# Patient Record
Sex: Male | Born: 1970 | Hispanic: No | Marital: Single | Smoking: Current every day smoker
Health system: Southern US, Community
[De-identification: ages and names within clinical notes are randomized; demographics above are authoritative.]

## PROBLEM LIST (undated history)

## (undated) DIAGNOSIS — F329 Major depressive disorder, single episode, unspecified: Secondary | ICD-10-CM

## (undated) DIAGNOSIS — K7460 Unspecified cirrhosis of liver: Secondary | ICD-10-CM

## (undated) DIAGNOSIS — R001 Bradycardia, unspecified: Secondary | ICD-10-CM

## (undated) DIAGNOSIS — I10 Essential (primary) hypertension: Secondary | ICD-10-CM

## (undated) DIAGNOSIS — N186 End stage renal disease: Secondary | ICD-10-CM

## (undated) DIAGNOSIS — E559 Vitamin D deficiency, unspecified: Secondary | ICD-10-CM

## (undated) DIAGNOSIS — K7290 Hepatic failure, unspecified without coma: Secondary | ICD-10-CM

## (undated) DIAGNOSIS — J189 Pneumonia, unspecified organism: Secondary | ICD-10-CM

## (undated) DIAGNOSIS — L0231 Cutaneous abscess of buttock: Secondary | ICD-10-CM

## (undated) DIAGNOSIS — F419 Anxiety disorder, unspecified: Secondary | ICD-10-CM

## (undated) DIAGNOSIS — Z992 Dependence on renal dialysis: Secondary | ICD-10-CM

## (undated) DIAGNOSIS — Z720 Tobacco use: Secondary | ICD-10-CM

## (undated) DIAGNOSIS — M109 Gout, unspecified: Secondary | ICD-10-CM

## (undated) DIAGNOSIS — G9340 Encephalopathy, unspecified: Secondary | ICD-10-CM

## (undated) DIAGNOSIS — I509 Heart failure, unspecified: Secondary | ICD-10-CM

## (undated) DIAGNOSIS — G40909 Epilepsy, unspecified, not intractable, without status epilepticus: Secondary | ICD-10-CM

## (undated) DIAGNOSIS — G629 Polyneuropathy, unspecified: Secondary | ICD-10-CM

## (undated) DIAGNOSIS — E872 Acidosis: Secondary | ICD-10-CM

## (undated) DIAGNOSIS — E119 Type 2 diabetes mellitus without complications: Secondary | ICD-10-CM

## (undated) DIAGNOSIS — N19 Unspecified kidney failure: Secondary | ICD-10-CM

## (undated) DIAGNOSIS — J9690 Respiratory failure, unspecified, unspecified whether with hypoxia or hypercapnia: Secondary | ICD-10-CM

## (undated) HISTORY — DX: End stage renal disease: Z99.2

## (undated) HISTORY — DX: Polyneuropathy, unspecified: G62.9

## (undated) HISTORY — PX: CHOLECYSTECTOMY: SHX55

## (undated) HISTORY — DX: Vitamin D deficiency, unspecified: E55.9

## (undated) HISTORY — DX: Bradycardia, unspecified: R00.1

## (undated) HISTORY — DX: Heart failure, unspecified: I50.9

## (undated) HISTORY — DX: Tobacco use: Z72.0

## (undated) HISTORY — DX: Essential (primary) hypertension: I10

## (undated) HISTORY — DX: Cutaneous abscess of buttock: L02.31

## (undated) HISTORY — DX: Encephalopathy, unspecified: G93.40

## (undated) HISTORY — DX: Respiratory failure, unspecified, unspecified whether with hypoxia or hypercapnia: J96.90

## (undated) HISTORY — DX: Major depressive disorder, single episode, unspecified: F32.9

## (undated) HISTORY — DX: End stage renal disease: N18.6

## (undated) HISTORY — DX: Gout, unspecified: M10.9

## (undated) HISTORY — DX: Anxiety disorder, unspecified: F41.9

## (undated) HISTORY — DX: Epilepsy, unspecified, not intractable, without status epilepticus: G40.909

---
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
## 2005-01-24 HISTORY — PX: HERNIA REPAIR: SHX51

## 2005-09-21 ENCOUNTER — Ambulatory Visit (HOSPITAL_BASED_OUTPATIENT_CLINIC_OR_DEPARTMENT_OTHER): Admission: RE | Admit: 2005-09-21 | Discharge: 2005-09-21 | Payer: Self-pay | Admitting: Family Medicine

## 2005-09-24 ENCOUNTER — Ambulatory Visit: Payer: Self-pay | Admitting: Internal Medicine

## 2014-02-03 DIAGNOSIS — L02419 Cutaneous abscess of limb, unspecified: Secondary | ICD-10-CM

## 2014-02-03 DIAGNOSIS — E875 Hyperkalemia: Secondary | ICD-10-CM | POA: Insufficient documentation

## 2014-02-03 DIAGNOSIS — E876 Hypokalemia: Secondary | ICD-10-CM | POA: Insufficient documentation

## 2014-02-03 HISTORY — DX: Cutaneous abscess of limb, unspecified: L02.419

## 2014-09-19 DIAGNOSIS — L03031 Cellulitis of right toe: Secondary | ICD-10-CM

## 2014-09-19 DIAGNOSIS — N492 Inflammatory disorders of scrotum: Secondary | ICD-10-CM | POA: Insufficient documentation

## 2014-09-19 HISTORY — DX: Cellulitis of right toe: L03.031

## 2014-09-19 HISTORY — DX: Inflammatory disorders of scrotum: N49.2

## 2016-01-06 ENCOUNTER — Encounter: Payer: Self-pay | Admitting: Sports Medicine

## 2016-01-06 ENCOUNTER — Ambulatory Visit (INDEPENDENT_AMBULATORY_CARE_PROVIDER_SITE_OTHER): Payer: Medicare Other | Admitting: Sports Medicine

## 2016-01-06 DIAGNOSIS — B070 Plantar wart: Secondary | ICD-10-CM

## 2016-01-06 DIAGNOSIS — L84 Corns and callosities: Secondary | ICD-10-CM | POA: Diagnosis not present

## 2016-01-06 DIAGNOSIS — L97519 Non-pressure chronic ulcer of other part of right foot with unspecified severity: Secondary | ICD-10-CM | POA: Diagnosis not present

## 2016-01-06 DIAGNOSIS — E1142 Type 2 diabetes mellitus with diabetic polyneuropathy: Secondary | ICD-10-CM | POA: Diagnosis not present

## 2016-01-06 DIAGNOSIS — B351 Tinea unguium: Secondary | ICD-10-CM | POA: Diagnosis not present

## 2016-01-06 DIAGNOSIS — M79675 Pain in left toe(s): Secondary | ICD-10-CM | POA: Diagnosis not present

## 2016-01-06 DIAGNOSIS — M79674 Pain in right toe(s): Secondary | ICD-10-CM

## 2016-01-06 DIAGNOSIS — Z872 Personal history of diseases of the skin and subcutaneous tissue: Secondary | ICD-10-CM | POA: Diagnosis not present

## 2016-01-06 NOTE — Progress Notes (Signed)
Subjective: Eddie Davies is a 45 y.o. male patient seen in office for evaluation of ulceration of the Right hallux. Patient has a history of diabetes and a blood glucose level 187 today but states that he thinks his machine is broken because once minute later he will get a # >400.   Patient is changing the dressing using nothing. States that the wound care center tried to heal the wound but could not so sent him here. Denies nausea/fever/vomiting/chills/night sweats/shortness of breath/pain. Patient is also here for nail and callus care. Patient has no other pedal complaints at this time.  There are no active problems to display for this patient.  No current outpatient prescriptions on file prior to visit.   No current facility-administered medications on file prior to visit.    Not on File  No results found for this or any previous visit (from the past 2160 hour(s)).  Objective: There were no vitals filed for this visit.  General: Patient is awake, alert, oriented x 3 and in no acute distress.  Dermatology: Skin is warm and dry bilateral with a Full thickness ulceration present right plantar medial hallux. Ulceration measures 3 cm x by 3 cm x 0.5 cm. There is a  Severely hyperkeratotic border with a bleeding granular base concerning for possible warty involvement or marjolins ulcer base. The ulceration does not probe to bone. There is no malodor, no active drainage, no erythema, no edema. No acute signs of infection. Severe callusing, plantar left hallux and submetatarsal 1 on left with no acute openings or signs of infection. 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 Nails 10 are elongated, thickened and dystrophic consistent with onychomycosis.   Vascular: Dorsalis Pedis pulse = 0/4 Bilateral,  Posterior Tibial pulse = 0/4 Bilateral due to Patient habitus,  Capillary Fill Time < 5 seconds  Neurologic: Protective and  vibratory sensation absent bilateral.  Musculosketal: No Pain with palpation to ulcerated area. No pain with compression to calves bilateral. Pes planus noted bilateral.    Assessment and Plan:  Problem List Items Addressed This Visit    None    Visit Diagnoses    Skin ulcer of right great toe, unspecified ulcer stage ([place])    -  Primary   Relevant Orders   Dermatology pathology   Plantar wart of right foot       Relevant Orders   Dermatology pathology   Callus of foot       Relevant Orders   Dermatology pathology   Dermatophytosis of nail       Relevant Orders   Dermatology pathology   Diabetic polyneuropathy associated with type 2 diabetes mellitus (HCC)       Relevant Medications   carbamazepine (CARBATROL) 300 MG 12 hr capsule   gabapentin (NEURONTIN) 300 MG capsule   LANTUS SOLOSTAR 100 UNIT/ML Solostar Pen   HUMULIN R U-500 KWIKPEN 500 UNIT/ML kwikpen   metFORMIN (GLUCOPHAGE) 500 MG tablet   PHENobarbital (LUMINAL) 97.2 MG tablet   valsartan (DIOVAN) 160 MG tablet   Other Relevant Orders   Dermatology pathology   Toe pain, bilateral       Relevant Orders   Dermatology pathology   H/O keratoderma          -Examined patient and discussed the progression of the wound and treatment alternatives. - Excisionally dedbrided ulceration to healthy bleeding borders using a sterile chisel/15  bladeAnd sent tissue specimen to [facility] for pathologic review to evaluate for warty involvement -Applied Iodosorb and dry sterile dressing and instructed patient to continue with daily dressings at home consisting of Betadine and bandaid/dry sterile dressing. - Advised patient to go to the ER or return to office if the wound worsens or if constitutional symptoms are present. -Mechanically debrided nails 10 using sterile nail nipper and mechanically debrided callus using a sterile chisel blade without incident -Recommend daily skin emollients and creams as recommended by previous  dermatologist -Patient to return to office in 1 week for follow up care and evaluation or sooner if problems arise. Patient states that he cannot follow up in 1 week. Thus, I strongly advised patient to figure out a way to have the ulcer checked in 1 week. Otherwise, if patient is not able to come, to come at earliest convenience, which patient states he can come in 3 weeks.  Landis Martins, DPM

## 2016-01-06 NOTE — Patient Instructions (Signed)

## 2016-01-27 ENCOUNTER — Ambulatory Visit (INDEPENDENT_AMBULATORY_CARE_PROVIDER_SITE_OTHER): Payer: Medicare Other | Admitting: Sports Medicine

## 2016-01-27 ENCOUNTER — Other Ambulatory Visit: Payer: Self-pay

## 2016-01-27 ENCOUNTER — Encounter: Payer: Self-pay | Admitting: Sports Medicine

## 2016-01-27 DIAGNOSIS — B070 Plantar wart: Secondary | ICD-10-CM

## 2016-01-27 DIAGNOSIS — L97501 Non-pressure chronic ulcer of other part of unspecified foot limited to breakdown of skin: Secondary | ICD-10-CM

## 2016-01-27 DIAGNOSIS — L84 Corns and callosities: Secondary | ICD-10-CM

## 2016-01-27 DIAGNOSIS — E1142 Type 2 diabetes mellitus with diabetic polyneuropathy: Secondary | ICD-10-CM

## 2016-01-27 MED ORDER — AMOXICILLIN-POT CLAVULANATE 875-125 MG PO TABS: 1.0000 | ORAL_TABLET | Freq: Two times a day (BID) | ORAL | 0 refills | Status: DC

## 2016-01-27 NOTE — Progress Notes (Signed)
Subjective: Eddie Davies is a 46 y.o. male patient seen in office for follow up evaluation of ulceration of the Right>left hallux and plantar wart. Patient has a history of diabetes and a blood glucose level not recorded.   Patient is changing the dressing using iodine.Denies nausea/fever/vomiting/chills/night sweats/shortness of breath/pain. Patient has no other pedal complaints at this time.  There are no active problems to display for this patient.  Current Outpatient Prescriptions on File Prior to Visit  Medication Sig Dispense Refill  . acetaminophen-codeine (TYLENOL #3) 300-30 MG tablet     . amLODipine (NORVASC) 10 MG tablet     . carbamazepine (CARBATROL) 300 MG 12 hr capsule     . COLCRYS 0.6 MG tablet TK 1 T PO  QD PRN  0  . gabapentin (NEURONTIN) 300 MG capsule     . HUMULIN R U-500 KWIKPEN 500 UNIT/ML kwikpen     . LANTUS SOLOSTAR 100 UNIT/ML Solostar Pen     . metFORMIN (GLUCOPHAGE) 500 MG tablet     . omeprazole (PRILOSEC) 20 MG capsule     . PHENobarbital (LUMINAL) 97.2 MG tablet TK 2 AND 1/2 TS PO QHS  1  . ULORIC 40 MG tablet     . valsartan (DIOVAN) 160 MG tablet      No current facility-administered medications on file prior to visit.    Not on File  No results found for this or any previous visit (from the past 2160 hour(s)).  Objective: There were no vitals filed for this visit.  General: Patient is awake, alert, oriented x 3 and in no acute distress.   Dermatology: Skin is warm and dry bilateral with a partial thickness ulceration present right plantar medial hallux. Ulceration measures 3 cm x by 2.5 cm x 0.5 cm. There is a Severely hyperkeratotic border with a bleeding granular base concerning surrounding warty involvement. The ulceration does not probe to bone. There is no malodor, no active drainage, no erythema, no edema. No acute signs of infection. 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 Nails 10 are elongated, thickened and dystrophic consistent with onychomycosis.   Vascular: Dorsalis Pedis pulse = 0/4 Bilateral,  Posterior Tibial pulse = 0/4 Bilateral due to Patient habitus,  Capillary Fill Time < 5 seconds  Neurologic: Protective and vibratory sensation absent bilateral.  Musculosketal: No Pain with palpation to ulcerated area. No pain with compression to calves bilateral. Pes planus noted bilateral.    Assessment and Plan:  Problem List Items Addressed This Visit    None    Visit Diagnoses    Skin ulcer of great toe, unspecified laterality, limited to breakdown of skin ([place])    -  Primary   bilateral   Plantar wart of both feet       + Path Traumatized Wart   Callus of foot       Diabetic polyneuropathy associated with type 2 diabetes mellitus ([place])          -Examined patient and discussed the progression of the wound and treatment alternatives. - Excisionally dedbrided ulcerations to healthy bleeding borders using a sterile chisel/15  Blade -Applied Iodosorb to central wound and canthrone to periwound area and dry sterile dressing and instructed patient to continue with daily dressings at home consisting of Betadine and bandaid/dry sterile dressing. - Advised patient  to go to the ER or return to office if the wound worsens or if constitutional symptoms are present. -Recommend daily skin emollients and creams as recommended by previous dermatologist -Patient to return to office in 3 weeks.  Landis Martins, DPM

## 2016-02-17 ENCOUNTER — Ambulatory Visit (INDEPENDENT_AMBULATORY_CARE_PROVIDER_SITE_OTHER): Payer: Medicare Other | Admitting: Sports Medicine

## 2016-02-17 ENCOUNTER — Encounter: Payer: Self-pay | Admitting: Sports Medicine

## 2016-02-17 DIAGNOSIS — B070 Plantar wart: Secondary | ICD-10-CM

## 2016-02-17 DIAGNOSIS — L97501 Non-pressure chronic ulcer of other part of unspecified foot limited to breakdown of skin: Secondary | ICD-10-CM

## 2016-02-17 DIAGNOSIS — E1142 Type 2 diabetes mellitus with diabetic polyneuropathy: Secondary | ICD-10-CM

## 2016-02-17 NOTE — Progress Notes (Signed)
Subjective: Eddie Davies is a 46 y.o. male patient seen in office for follow up evaluation of ulceration of the Right>left hallux and plantar wart. Patient has a history of diabetes and a blood glucose level not recorded.   Patient is changing the dressing using iodine.Denies nausea/fever/vomiting/chills/night sweats/shortness of breath/pain. Patient has no other pedal complaints at this time.  There are no active problems to display for this patient.  Current Outpatient Prescriptions on File Prior to Visit  Medication Sig Dispense Refill  . acetaminophen-codeine (TYLENOL #3) 300-30 MG tablet     . amLODipine (NORVASC) 10 MG tablet     . amoxicillin-clavulanate (AUGMENTIN) 875-125 MG tablet Take 1 tablet by mouth 2 (two) times daily. 28 tablet 0  . carbamazepine (CARBATROL) 300 MG 12 hr capsule     . COLCRYS 0.6 MG tablet TK 1 T PO  QD PRN  0  . gabapentin (NEURONTIN) 300 MG capsule     . HUMULIN R U-500 KWIKPEN 500 UNIT/ML kwikpen     . LANTUS SOLOSTAR 100 UNIT/ML Solostar Pen     . metFORMIN (GLUCOPHAGE) 500 MG tablet     . omeprazole (PRILOSEC) 20 MG capsule     . PHENobarbital (LUMINAL) 97.2 MG tablet TK 2 AND 1/2 TS PO QHS  1  . simvastatin (ZOCOR) 40 MG tablet TK 1 T PO  QD  3  . ULORIC 40 MG tablet     . valsartan (DIOVAN) 160 MG tablet      No current facility-administered medications on file prior to visit.    Not on File  No results found for this or any previous visit (from the past 2160 hour(s)).  Objective: There were no vitals filed for this visit.  General: Patient is awake, alert, oriented x 3 and in no acute distress.   Dermatology: Skin is warm and dry bilateral with a partial thickness ulceration present right 4th toe measuring 2x1cm and plantar medial hallux measuring 2.5 cm x by 2.5 cm x 0.2 cm. There is a Severely hyperkeratotic border with a bleeding granular base concerning surrounding warty involvement. The ulceration does not probe to bone. There is no  malodor, no active drainage, no erythema, no edema. No acute signs of infection. 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 Nails 10 are elongated, thickened and dystrophic consistent with onychomycosis.   Vascular: Dorsalis Pedis pulse = 0/4 Bilateral,  Posterior Tibial pulse = 0/4 Bilateral due to Patient habitus,  Capillary Fill Time < 5 seconds  Neurologic: Protective and vibratory sensation absent bilateral.  Musculosketal: No Pain with palpation to ulcerated area. No pain with compression to calves bilateral. Pes planus noted bilateral.    Assessment and Plan:  Problem List Items Addressed This Visit    None    Visit Diagnoses    Skin ulcer of great toe, unspecified laterality, limited to breakdown of skin (HCC)    -  Primary   Plantar wart of both feet       Callus of foot       Diabetic polyneuropathy associated with type 2 diabetes mellitus (HCC)       Relevant Medications   gabapentin (NEURONTIN) 600 MG tablet      -Examined patient and discussed the progression of the wound and treatment alternatives. - Excisionally dedbrided ulcerations to healthy bleeding  borders using a sterile chisel Blade and tissue nipper -Applied Iodosorb to central wound and canthrone to periwound area bilateral and dry sterile dressing and instructed patient to continue with daily dressings at home consisting of Betadine and bandaid/dry sterile dressing. - Advised patient to go to the ER or return to office if the wound worsens or if constitutional symptoms are present. -Recommend daily skin emollients and creams as recommended by previous dermatologist -Patient to return to office in 3-4 weeks.  Landis Martins, DPM

## 2016-03-16 ENCOUNTER — Encounter: Payer: Self-pay | Admitting: Sports Medicine

## 2016-03-16 ENCOUNTER — Ambulatory Visit (INDEPENDENT_AMBULATORY_CARE_PROVIDER_SITE_OTHER): Payer: Medicare Other | Admitting: Sports Medicine

## 2016-03-16 DIAGNOSIS — B070 Plantar wart: Secondary | ICD-10-CM

## 2016-03-16 DIAGNOSIS — M79675 Pain in left toe(s): Secondary | ICD-10-CM

## 2016-03-16 DIAGNOSIS — E1142 Type 2 diabetes mellitus with diabetic polyneuropathy: Secondary | ICD-10-CM

## 2016-03-16 DIAGNOSIS — L97501 Non-pressure chronic ulcer of other part of unspecified foot limited to breakdown of skin: Secondary | ICD-10-CM

## 2016-03-16 DIAGNOSIS — M79674 Pain in right toe(s): Secondary | ICD-10-CM

## 2016-03-16 NOTE — Progress Notes (Signed)
Subjective: Eddie Davies is a 46 y.o. male patient seen in office for follow up evaluation of ulceration of the Right>left hallux and plantar wart. Patient has a history of diabetes and a blood glucose level not recorded.   Patient is changing the dressing using iodine. Reports he has to go to GI doctor. Denies nausea/fever/vomiting/chills/night sweats/shortness of breath/pain. Patient has no other pedal complaints at this time.  There are no active problems to display for this patient.  Current Outpatient Prescriptions on File Prior to Visit  Medication Sig Dispense Refill  . acetaminophen-codeine (TYLENOL #3) 300-30 MG tablet     . amLODipine (NORVASC) 10 MG tablet     . amoxicillin-clavulanate (AUGMENTIN) 875-125 MG tablet Take 1 tablet by mouth 2 (two) times daily. 28 tablet 0  . carbamazepine (CARBATROL) 300 MG 12 hr capsule     . COLCRYS 0.6 MG tablet TK 1 T PO  QD PRN  0  . gabapentin (NEURONTIN) 300 MG capsule     . gabapentin (NEURONTIN) 600 MG tablet     . HUMULIN R U-500 KWIKPEN 500 UNIT/ML kwikpen     . LANTUS SOLOSTAR 100 UNIT/ML Solostar Pen     . metFORMIN (GLUCOPHAGE) 500 MG tablet     . metoprolol (LOPRESSOR) 50 MG tablet     . omeprazole (PRILOSEC) 20 MG capsule     . PHENobarbital (LUMINAL) 97.2 MG tablet TK 2 AND 1/2 TS PO QHS  1  . simvastatin (ZOCOR) 40 MG tablet TK 1 T PO  QD  3  . ULORIC 40 MG tablet     . valsartan (DIOVAN) 160 MG tablet      No current facility-administered medications on file prior to visit.    Not on File  No results found for this or any previous visit (from the past 2160 hour(s)).  Objective: There were no vitals filed for this visit.  General: Patient is awake, alert, oriented x 3 and in no acute distress.   Dermatology: Skin is warm and dry bilateral with a partial thickness ulceration present right 4th toe measuring 2x1cm and plantar medial hallux measuring 2.5 cm x by 2.5 cm x 0.2 cm same. There is a Severely hyperkeratotic  border with a bleeding granular base concerning surrounding warty involvement. The ulceration does not probe to bone. There is no malodor, no active drainage, no erythema, no edema. No acute signs of infection. 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 Nails 10 are elongated, thickened and dystrophic consistent with onychomycosis.   Vascular: Dorsalis Pedis pulse = 0/4 Bilateral,  Posterior Tibial pulse = 0/4 Bilateral due to Patient habitus,  Capillary Fill Time < 5 seconds  Neurologic: Protective and vibratory sensation absent bilateral.  Musculosketal: No Pain with palpation to ulcerated area. No pain with compression to calves bilateral. Pes planus noted bilateral.    Assessment and Plan:  Problem List Items Addressed This Visit    None    Visit Diagnoses    Skin ulcer of great toe, unspecified laterality, limited to breakdown of skin (HCC)    -  Primary   Plantar wart of both feet       Diabetic polyneuropathy associated with type 2 diabetes mellitus (HCC)       Toe pain, bilateral          -  Examined patient and discussed the progression of the wound and treatment alternatives. - Excisionally dedbrided ulcerations to healthy bleeding borders using a sterile chisel Blade and tissue nipper -Applied Iodosorb to central wound and canthrone to periwound area bilateral and dry sterile dressing and instructed patient to continue with daily dressings at home consisting of Betadine and alternative with Medihoney to see if we can get improvement covered with bandaid/dry sterile dressing. - Advised patient to go to the ER or return to office if the wound worsens or if constitutional symptoms are present. -Recommend daily skin emollients and creams as recommended by previous dermatologist -Patient to  return to office in 3-4 weeks.  Landis Martins, DPM

## 2016-04-13 ENCOUNTER — Ambulatory Visit (INDEPENDENT_AMBULATORY_CARE_PROVIDER_SITE_OTHER): Payer: Medicare Other | Admitting: Sports Medicine

## 2016-04-13 ENCOUNTER — Encounter: Payer: Self-pay | Admitting: Sports Medicine

## 2016-04-13 DIAGNOSIS — M79675 Pain in left toe(s): Secondary | ICD-10-CM

## 2016-04-13 DIAGNOSIS — E1142 Type 2 diabetes mellitus with diabetic polyneuropathy: Secondary | ICD-10-CM

## 2016-04-13 DIAGNOSIS — B070 Plantar wart: Secondary | ICD-10-CM

## 2016-04-13 DIAGNOSIS — L97501 Non-pressure chronic ulcer of other part of unspecified foot limited to breakdown of skin: Secondary | ICD-10-CM

## 2016-04-13 DIAGNOSIS — M79674 Pain in right toe(s): Secondary | ICD-10-CM

## 2016-04-13 DIAGNOSIS — L02611 Cutaneous abscess of right foot: Secondary | ICD-10-CM

## 2016-04-13 DIAGNOSIS — L03031 Cellulitis of right toe: Secondary | ICD-10-CM

## 2016-04-13 MED ORDER — AMOXICILLIN-POT CLAVULANATE 875-125 MG PO TABS: 1.0000 | ORAL_TABLET | Freq: Two times a day (BID) | ORAL | 0 refills | Status: DC

## 2016-04-13 MED ORDER — CLINDAMYCIN HCL 300 MG PO CAPS: 300.0000 mg | ORAL_CAPSULE | Freq: Three times a day (TID) | ORAL | 0 refills | Status: DC

## 2016-04-13 NOTE — Progress Notes (Signed)
Subjective: Eddie Davies is a 46 y.o. male patient seen in office for follow up evaluation of ulceration of the Right>left hallux and plantar wart. Patient has a history of diabetes and a blood glucose level not recorded.   Patient is changing the dressing using iodine and alternating medihoney however is going several days with changing it even though told to change daily . Reports he is dealing with a lot of health issues and did not follow up with GI doctor. Denies nausea/fever/vomiting/chills/night sweats/shortness of breath/pain. Patient has no other pedal complaints at this time.  There are no active problems to display for this patient.  Current Outpatient Prescriptions on File Prior to Visit  Medication Sig Dispense Refill  . acetaminophen-codeine (TYLENOL #3) 300-30 MG tablet     . amLODipine (NORVASC) 10 MG tablet     . carbamazepine (CARBATROL) 300 MG 12 hr capsule     . COLCRYS 0.6 MG tablet TK 1 T PO  QD PRN  0  . gabapentin (NEURONTIN) 300 MG capsule     . gabapentin (NEURONTIN) 600 MG tablet     . HUMULIN R U-500 KWIKPEN 500 UNIT/ML kwikpen     . LANTUS SOLOSTAR 100 UNIT/ML Solostar Pen     . metFORMIN (GLUCOPHAGE) 500 MG tablet     . metoprolol (LOPRESSOR) 50 MG tablet     . omeprazole (PRILOSEC) 20 MG capsule     . PHENobarbital (LUMINAL) 97.2 MG tablet TK 2 AND 1/2 TS PO QHS  1  . simvastatin (ZOCOR) 40 MG tablet TK 1 T PO  QD  3  . ULORIC 40 MG tablet     . valsartan (DIOVAN) 160 MG tablet      No current facility-administered medications on file prior to visit.    Not on File  No results found for this or any previous visit (from the past 2160 hour(s)).  Objective: There were no vitals filed for this visit.  General: Patient is awake, alert, oriented x 3 and in no acute distress. Patient is dirty and sleeping during this encounter   Dermatology: Skin is warm and dry bilateral with a partial thickness ulceration present right 4th toe measuring 2x1cm and  plantar medial hallux measuring 4x4cm engulfing medial 1st toe on right. There is a Severely hyperkeratotic border with a bleeding granular base concerning surrounding warty involvement now with slofting tissue and malodor likely secondary to not changing dressings. The ulceration does not probe to bone. There is bloody drainage from right ulcer, minimal erythema, no edema. No acute signs of infection. 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 Nails 10 are short, thickened and dystrophic consistent with onychomycosis.   Vascular: Dorsalis Pedis pulse = 0/4 Bilateral,  Posterior Tibial pulse = 0/4 Bilateral due to Patient habitus,  Capillary Fill Time < 5 seconds  Neurologic: Protective and vibratory sensation absent bilateral.  Musculosketal: No Pain with palpation to ulcerated area. No pain with compression to calves bilateral. Pes planus noted bilateral.    Assessment and Plan:  Problem List Items Addressed This Visit    None    Visit Diagnoses    Skin ulcer of great toe, unspecified laterality, limited to breakdown of skin ([place])    -  Primary   Plantar wart of both feet  Relevant Medications   clindamycin (CLEOCIN) 300 MG capsule   Diabetic polyneuropathy associated with type 2 diabetes mellitus (HCC)       Toe pain, bilateral       Cellulitis and abscess of toe of right foot       Relevant Medications   amoxicillin-clavulanate (AUGMENTIN) 875-125 MG tablet   clindamycin (CLEOCIN) 300 MG capsule      -Examined patient and discussed the progression of the wound and treatment alternatives. - Excisionally dedbrided ulcerations to healthy bleeding borders using a sterile chisel Blade and tissue nipper -Applied Iodosorb to central wound on left and surgicel pad to right and dry sterile  dressing and instructed patient to continue with daily dressings at home consisting of Betadine and alternate with Medihoney to see if we can get improvement covered with dry sterile dressing daily; Stressed to patient the need for daily dressing changes and the risk of him losing his toe or his foot if he does not take better care of himself -Rx Clinda and Augmentin for right toe ulcer which had malodor - Advised patient to go to the ER or return to office if the wound worsens or if constitutional symptoms are present. -Recommend daily skin emollients and creams as recommended by previous dermatologist -Patient to return to office in 2 weeks for ulcer check.  Landis Martins, DPM

## 2016-04-17 DIAGNOSIS — K922 Gastrointestinal hemorrhage, unspecified: Secondary | ICD-10-CM

## 2016-04-17 DIAGNOSIS — E785 Hyperlipidemia, unspecified: Secondary | ICD-10-CM | POA: Diagnosis not present

## 2016-04-17 DIAGNOSIS — E872 Acidosis: Secondary | ICD-10-CM

## 2016-04-17 DIAGNOSIS — K219 Gastro-esophageal reflux disease without esophagitis: Secondary | ICD-10-CM | POA: Diagnosis not present

## 2016-04-17 DIAGNOSIS — E1169 Type 2 diabetes mellitus with other specified complication: Secondary | ICD-10-CM

## 2016-04-17 DIAGNOSIS — E874 Mixed disorder of acid-base balance: Secondary | ICD-10-CM | POA: Diagnosis not present

## 2016-04-17 DIAGNOSIS — G9341 Metabolic encephalopathy: Secondary | ICD-10-CM | POA: Diagnosis not present

## 2016-04-17 DIAGNOSIS — M109 Gout, unspecified: Secondary | ICD-10-CM

## 2016-04-17 DIAGNOSIS — E860 Dehydration: Secondary | ICD-10-CM | POA: Diagnosis not present

## 2016-04-17 DIAGNOSIS — I10 Essential (primary) hypertension: Secondary | ICD-10-CM | POA: Diagnosis not present

## 2016-04-17 DIAGNOSIS — Z720 Tobacco use: Secondary | ICD-10-CM | POA: Diagnosis not present

## 2016-04-17 DIAGNOSIS — G40909 Epilepsy, unspecified, not intractable, without status epilepticus: Secondary | ICD-10-CM | POA: Diagnosis not present

## 2016-04-17 DIAGNOSIS — N179 Acute kidney failure, unspecified: Secondary | ICD-10-CM

## 2016-04-18 DIAGNOSIS — E874 Mixed disorder of acid-base balance: Secondary | ICD-10-CM | POA: Diagnosis not present

## 2016-04-18 DIAGNOSIS — K219 Gastro-esophageal reflux disease without esophagitis: Secondary | ICD-10-CM | POA: Diagnosis not present

## 2016-04-18 DIAGNOSIS — E1169 Type 2 diabetes mellitus with other specified complication: Secondary | ICD-10-CM | POA: Diagnosis not present

## 2016-04-18 DIAGNOSIS — G40909 Epilepsy, unspecified, not intractable, without status epilepticus: Secondary | ICD-10-CM | POA: Diagnosis not present

## 2016-04-18 DIAGNOSIS — G9341 Metabolic encephalopathy: Secondary | ICD-10-CM | POA: Diagnosis not present

## 2016-04-18 DIAGNOSIS — N179 Acute kidney failure, unspecified: Secondary | ICD-10-CM | POA: Diagnosis not present

## 2016-04-18 DIAGNOSIS — E872 Acidosis: Secondary | ICD-10-CM | POA: Diagnosis not present

## 2016-04-18 DIAGNOSIS — Z720 Tobacco use: Secondary | ICD-10-CM | POA: Diagnosis not present

## 2016-04-18 DIAGNOSIS — E785 Hyperlipidemia, unspecified: Secondary | ICD-10-CM | POA: Diagnosis not present

## 2016-04-18 DIAGNOSIS — E860 Dehydration: Secondary | ICD-10-CM | POA: Diagnosis not present

## 2016-04-18 DIAGNOSIS — I10 Essential (primary) hypertension: Secondary | ICD-10-CM | POA: Diagnosis not present

## 2016-04-19 DIAGNOSIS — K219 Gastro-esophageal reflux disease without esophagitis: Secondary | ICD-10-CM | POA: Diagnosis not present

## 2016-04-19 DIAGNOSIS — I10 Essential (primary) hypertension: Secondary | ICD-10-CM | POA: Diagnosis not present

## 2016-04-19 DIAGNOSIS — E860 Dehydration: Secondary | ICD-10-CM | POA: Diagnosis not present

## 2016-04-19 DIAGNOSIS — E872 Acidosis: Secondary | ICD-10-CM | POA: Diagnosis not present

## 2016-04-19 DIAGNOSIS — G40909 Epilepsy, unspecified, not intractable, without status epilepticus: Secondary | ICD-10-CM | POA: Diagnosis not present

## 2016-04-19 DIAGNOSIS — Z720 Tobacco use: Secondary | ICD-10-CM | POA: Diagnosis not present

## 2016-04-19 DIAGNOSIS — E785 Hyperlipidemia, unspecified: Secondary | ICD-10-CM | POA: Diagnosis not present

## 2016-04-19 DIAGNOSIS — E874 Mixed disorder of acid-base balance: Secondary | ICD-10-CM | POA: Diagnosis not present

## 2016-04-19 DIAGNOSIS — G9341 Metabolic encephalopathy: Secondary | ICD-10-CM | POA: Diagnosis not present

## 2016-04-19 DIAGNOSIS — N179 Acute kidney failure, unspecified: Secondary | ICD-10-CM | POA: Diagnosis not present

## 2016-04-19 DIAGNOSIS — E1169 Type 2 diabetes mellitus with other specified complication: Secondary | ICD-10-CM | POA: Diagnosis not present

## 2016-04-20 DIAGNOSIS — E874 Mixed disorder of acid-base balance: Secondary | ICD-10-CM | POA: Diagnosis not present

## 2016-04-20 DIAGNOSIS — E785 Hyperlipidemia, unspecified: Secondary | ICD-10-CM

## 2016-04-20 DIAGNOSIS — I10 Essential (primary) hypertension: Secondary | ICD-10-CM | POA: Diagnosis not present

## 2016-04-20 DIAGNOSIS — E1169 Type 2 diabetes mellitus with other specified complication: Secondary | ICD-10-CM

## 2016-04-20 DIAGNOSIS — G9341 Metabolic encephalopathy: Secondary | ICD-10-CM | POA: Diagnosis not present

## 2016-04-20 DIAGNOSIS — G40909 Epilepsy, unspecified, not intractable, without status epilepticus: Secondary | ICD-10-CM | POA: Diagnosis not present

## 2016-04-20 DIAGNOSIS — N179 Acute kidney failure, unspecified: Secondary | ICD-10-CM

## 2016-04-20 DIAGNOSIS — Z720 Tobacco use: Secondary | ICD-10-CM | POA: Diagnosis not present

## 2016-04-21 DIAGNOSIS — I10 Essential (primary) hypertension: Secondary | ICD-10-CM | POA: Diagnosis not present

## 2016-04-21 DIAGNOSIS — G40909 Epilepsy, unspecified, not intractable, without status epilepticus: Secondary | ICD-10-CM | POA: Diagnosis not present

## 2016-04-21 DIAGNOSIS — E785 Hyperlipidemia, unspecified: Secondary | ICD-10-CM | POA: Diagnosis not present

## 2016-04-21 DIAGNOSIS — Z720 Tobacco use: Secondary | ICD-10-CM | POA: Diagnosis not present

## 2016-04-21 DIAGNOSIS — G9341 Metabolic encephalopathy: Secondary | ICD-10-CM | POA: Diagnosis not present

## 2016-04-21 DIAGNOSIS — N179 Acute kidney failure, unspecified: Secondary | ICD-10-CM | POA: Diagnosis not present

## 2016-04-21 DIAGNOSIS — E1169 Type 2 diabetes mellitus with other specified complication: Secondary | ICD-10-CM | POA: Diagnosis not present

## 2016-04-21 DIAGNOSIS — E874 Mixed disorder of acid-base balance: Secondary | ICD-10-CM | POA: Diagnosis not present

## 2016-04-22 DIAGNOSIS — Z720 Tobacco use: Secondary | ICD-10-CM | POA: Diagnosis not present

## 2016-04-22 DIAGNOSIS — G40909 Epilepsy, unspecified, not intractable, without status epilepticus: Secondary | ICD-10-CM | POA: Diagnosis not present

## 2016-04-22 DIAGNOSIS — E1169 Type 2 diabetes mellitus with other specified complication: Secondary | ICD-10-CM | POA: Diagnosis not present

## 2016-04-22 DIAGNOSIS — E874 Mixed disorder of acid-base balance: Secondary | ICD-10-CM | POA: Diagnosis not present

## 2016-04-22 DIAGNOSIS — I10 Essential (primary) hypertension: Secondary | ICD-10-CM | POA: Diagnosis not present

## 2016-04-22 DIAGNOSIS — N179 Acute kidney failure, unspecified: Secondary | ICD-10-CM | POA: Diagnosis not present

## 2016-04-22 DIAGNOSIS — G9341 Metabolic encephalopathy: Secondary | ICD-10-CM | POA: Diagnosis not present

## 2016-04-22 DIAGNOSIS — E785 Hyperlipidemia, unspecified: Secondary | ICD-10-CM | POA: Diagnosis not present

## 2016-04-27 DIAGNOSIS — E1310 Other specified diabetes mellitus with ketoacidosis without coma: Secondary | ICD-10-CM | POA: Diagnosis not present

## 2016-04-27 DIAGNOSIS — J9602 Acute respiratory failure with hypercapnia: Secondary | ICD-10-CM | POA: Diagnosis not present

## 2016-04-27 DIAGNOSIS — G9341 Metabolic encephalopathy: Secondary | ICD-10-CM | POA: Diagnosis not present

## 2016-04-27 DIAGNOSIS — N179 Acute kidney failure, unspecified: Secondary | ICD-10-CM

## 2016-04-27 DIAGNOSIS — J9600 Acute respiratory failure, unspecified whether with hypoxia or hypercapnia: Secondary | ICD-10-CM

## 2016-04-27 DIAGNOSIS — G40909 Epilepsy, unspecified, not intractable, without status epilepticus: Secondary | ICD-10-CM | POA: Diagnosis not present

## 2016-04-28 DIAGNOSIS — E1310 Other specified diabetes mellitus with ketoacidosis without coma: Secondary | ICD-10-CM | POA: Diagnosis not present

## 2016-04-28 DIAGNOSIS — N179 Acute kidney failure, unspecified: Secondary | ICD-10-CM | POA: Diagnosis not present

## 2016-04-28 DIAGNOSIS — J9600 Acute respiratory failure, unspecified whether with hypoxia or hypercapnia: Secondary | ICD-10-CM | POA: Diagnosis not present

## 2016-04-28 DIAGNOSIS — J9602 Acute respiratory failure with hypercapnia: Secondary | ICD-10-CM | POA: Diagnosis not present

## 2016-04-28 DIAGNOSIS — G9341 Metabolic encephalopathy: Secondary | ICD-10-CM | POA: Diagnosis not present

## 2016-04-28 DIAGNOSIS — G40909 Epilepsy, unspecified, not intractable, without status epilepticus: Secondary | ICD-10-CM | POA: Diagnosis not present

## 2016-04-29 ENCOUNTER — Ambulatory Visit: Payer: Medicare Other | Admitting: Sports Medicine

## 2016-04-29 DIAGNOSIS — J9600 Acute respiratory failure, unspecified whether with hypoxia or hypercapnia: Secondary | ICD-10-CM | POA: Diagnosis not present

## 2016-04-29 DIAGNOSIS — G9341 Metabolic encephalopathy: Secondary | ICD-10-CM | POA: Diagnosis not present

## 2016-04-29 DIAGNOSIS — G40909 Epilepsy, unspecified, not intractable, without status epilepticus: Secondary | ICD-10-CM | POA: Diagnosis not present

## 2016-04-29 DIAGNOSIS — N179 Acute kidney failure, unspecified: Secondary | ICD-10-CM | POA: Diagnosis not present

## 2016-04-29 DIAGNOSIS — E1310 Other specified diabetes mellitus with ketoacidosis without coma: Secondary | ICD-10-CM | POA: Diagnosis not present

## 2016-04-29 DIAGNOSIS — J9602 Acute respiratory failure with hypercapnia: Secondary | ICD-10-CM | POA: Diagnosis not present

## 2016-04-30 DIAGNOSIS — J9600 Acute respiratory failure, unspecified whether with hypoxia or hypercapnia: Secondary | ICD-10-CM | POA: Diagnosis not present

## 2016-04-30 DIAGNOSIS — J9602 Acute respiratory failure with hypercapnia: Secondary | ICD-10-CM | POA: Diagnosis not present

## 2016-04-30 DIAGNOSIS — N179 Acute kidney failure, unspecified: Secondary | ICD-10-CM | POA: Diagnosis not present

## 2016-04-30 DIAGNOSIS — E1310 Other specified diabetes mellitus with ketoacidosis without coma: Secondary | ICD-10-CM | POA: Diagnosis not present

## 2016-04-30 DIAGNOSIS — G9341 Metabolic encephalopathy: Secondary | ICD-10-CM | POA: Diagnosis not present

## 2016-04-30 DIAGNOSIS — G40909 Epilepsy, unspecified, not intractable, without status epilepticus: Secondary | ICD-10-CM | POA: Diagnosis not present

## 2016-06-22 DIAGNOSIS — I129 Hypertensive chronic kidney disease with stage 1 through stage 4 chronic kidney disease, or unspecified chronic kidney disease: Secondary | ICD-10-CM

## 2016-06-22 DIAGNOSIS — E785 Hyperlipidemia, unspecified: Secondary | ICD-10-CM | POA: Diagnosis present

## 2016-06-22 DIAGNOSIS — E118 Type 2 diabetes mellitus with unspecified complications: Secondary | ICD-10-CM | POA: Diagnosis present

## 2016-06-22 DIAGNOSIS — N183 Chronic kidney disease, stage 3 unspecified: Secondary | ICD-10-CM | POA: Insufficient documentation

## 2016-06-22 HISTORY — DX: Type 2 diabetes mellitus with unspecified complications: E11.8

## 2016-06-22 HISTORY — DX: Hypertensive chronic kidney disease with stage 1 through stage 4 chronic kidney disease, or unspecified chronic kidney disease: I12.9

## 2016-07-19 DIAGNOSIS — L0291 Cutaneous abscess, unspecified: Secondary | ICD-10-CM

## 2016-07-19 DIAGNOSIS — G4733 Obstructive sleep apnea (adult) (pediatric): Secondary | ICD-10-CM | POA: Diagnosis not present

## 2016-07-19 DIAGNOSIS — I10 Essential (primary) hypertension: Secondary | ICD-10-CM | POA: Diagnosis not present

## 2016-07-19 DIAGNOSIS — N179 Acute kidney failure, unspecified: Secondary | ICD-10-CM

## 2016-07-19 DIAGNOSIS — G40909 Epilepsy, unspecified, not intractable, without status epilepticus: Secondary | ICD-10-CM | POA: Diagnosis not present

## 2016-07-19 DIAGNOSIS — E1310 Other specified diabetes mellitus with ketoacidosis without coma: Secondary | ICD-10-CM | POA: Diagnosis not present

## 2016-07-20 DIAGNOSIS — G4733 Obstructive sleep apnea (adult) (pediatric): Secondary | ICD-10-CM | POA: Diagnosis not present

## 2016-07-20 DIAGNOSIS — G40909 Epilepsy, unspecified, not intractable, without status epilepticus: Secondary | ICD-10-CM | POA: Diagnosis not present

## 2016-07-20 DIAGNOSIS — N179 Acute kidney failure, unspecified: Secondary | ICD-10-CM | POA: Diagnosis not present

## 2016-07-20 DIAGNOSIS — L0291 Cutaneous abscess, unspecified: Secondary | ICD-10-CM | POA: Diagnosis not present

## 2016-07-20 DIAGNOSIS — E1310 Other specified diabetes mellitus with ketoacidosis without coma: Secondary | ICD-10-CM | POA: Diagnosis not present

## 2016-07-20 DIAGNOSIS — I10 Essential (primary) hypertension: Secondary | ICD-10-CM | POA: Diagnosis not present

## 2016-07-21 DIAGNOSIS — I10 Essential (primary) hypertension: Secondary | ICD-10-CM | POA: Diagnosis not present

## 2016-07-21 DIAGNOSIS — G40909 Epilepsy, unspecified, not intractable, without status epilepticus: Secondary | ICD-10-CM | POA: Diagnosis not present

## 2016-07-21 DIAGNOSIS — L0291 Cutaneous abscess, unspecified: Secondary | ICD-10-CM | POA: Diagnosis not present

## 2016-07-21 DIAGNOSIS — G4733 Obstructive sleep apnea (adult) (pediatric): Secondary | ICD-10-CM | POA: Diagnosis not present

## 2016-07-21 DIAGNOSIS — N179 Acute kidney failure, unspecified: Secondary | ICD-10-CM | POA: Diagnosis not present

## 2016-07-21 DIAGNOSIS — E1310 Other specified diabetes mellitus with ketoacidosis without coma: Secondary | ICD-10-CM | POA: Diagnosis not present

## 2016-07-22 DIAGNOSIS — G40909 Epilepsy, unspecified, not intractable, without status epilepticus: Secondary | ICD-10-CM | POA: Diagnosis not present

## 2016-07-22 DIAGNOSIS — G4733 Obstructive sleep apnea (adult) (pediatric): Secondary | ICD-10-CM | POA: Diagnosis not present

## 2016-07-22 DIAGNOSIS — I10 Essential (primary) hypertension: Secondary | ICD-10-CM | POA: Diagnosis not present

## 2016-07-22 DIAGNOSIS — L0291 Cutaneous abscess, unspecified: Secondary | ICD-10-CM | POA: Diagnosis not present

## 2016-07-22 DIAGNOSIS — E1310 Other specified diabetes mellitus with ketoacidosis without coma: Secondary | ICD-10-CM | POA: Diagnosis not present

## 2016-07-22 DIAGNOSIS — N179 Acute kidney failure, unspecified: Secondary | ICD-10-CM | POA: Diagnosis not present

## 2016-08-09 DIAGNOSIS — R9431 Abnormal electrocardiogram [ECG] [EKG]: Secondary | ICD-10-CM | POA: Diagnosis not present

## 2016-10-30 DIAGNOSIS — E785 Hyperlipidemia, unspecified: Secondary | ICD-10-CM | POA: Diagnosis not present

## 2016-10-30 DIAGNOSIS — K219 Gastro-esophageal reflux disease without esophagitis: Secondary | ICD-10-CM | POA: Diagnosis not present

## 2016-10-30 DIAGNOSIS — E860 Dehydration: Secondary | ICD-10-CM

## 2016-10-30 DIAGNOSIS — I10 Essential (primary) hypertension: Secondary | ICD-10-CM | POA: Diagnosis not present

## 2016-10-30 DIAGNOSIS — G40909 Epilepsy, unspecified, not intractable, without status epilepticus: Secondary | ICD-10-CM | POA: Diagnosis not present

## 2016-10-30 DIAGNOSIS — N390 Urinary tract infection, site not specified: Secondary | ICD-10-CM

## 2016-10-30 DIAGNOSIS — Z720 Tobacco use: Secondary | ICD-10-CM | POA: Diagnosis not present

## 2016-10-30 DIAGNOSIS — E1169 Type 2 diabetes mellitus with other specified complication: Secondary | ICD-10-CM

## 2016-10-31 DIAGNOSIS — N1330 Unspecified hydronephrosis: Secondary | ICD-10-CM

## 2016-10-31 DIAGNOSIS — N135 Crossing vessel and stricture of ureter without hydronephrosis: Secondary | ICD-10-CM

## 2016-10-31 DIAGNOSIS — G4733 Obstructive sleep apnea (adult) (pediatric): Secondary | ICD-10-CM

## 2016-10-31 DIAGNOSIS — I10 Essential (primary) hypertension: Secondary | ICD-10-CM | POA: Diagnosis not present

## 2016-10-31 DIAGNOSIS — N179 Acute kidney failure, unspecified: Secondary | ICD-10-CM | POA: Diagnosis not present

## 2016-10-31 DIAGNOSIS — N390 Urinary tract infection, site not specified: Secondary | ICD-10-CM | POA: Diagnosis not present

## 2016-10-31 DIAGNOSIS — Z720 Tobacco use: Secondary | ICD-10-CM

## 2016-10-31 DIAGNOSIS — M109 Gout, unspecified: Secondary | ICD-10-CM | POA: Diagnosis not present

## 2016-10-31 DIAGNOSIS — N10 Acute tubulo-interstitial nephritis: Secondary | ICD-10-CM | POA: Diagnosis not present

## 2016-10-31 DIAGNOSIS — G40909 Epilepsy, unspecified, not intractable, without status epilepticus: Secondary | ICD-10-CM

## 2016-10-31 DIAGNOSIS — E860 Dehydration: Secondary | ICD-10-CM

## 2016-10-31 DIAGNOSIS — K219 Gastro-esophageal reflux disease without esophagitis: Secondary | ICD-10-CM | POA: Diagnosis not present

## 2016-10-31 DIAGNOSIS — E1169 Type 2 diabetes mellitus with other specified complication: Secondary | ICD-10-CM | POA: Diagnosis not present

## 2016-10-31 DIAGNOSIS — E785 Hyperlipidemia, unspecified: Secondary | ICD-10-CM | POA: Diagnosis not present

## 2016-11-01 DIAGNOSIS — N10 Acute tubulo-interstitial nephritis: Secondary | ICD-10-CM | POA: Diagnosis not present

## 2016-11-01 DIAGNOSIS — E860 Dehydration: Secondary | ICD-10-CM | POA: Diagnosis not present

## 2016-11-01 DIAGNOSIS — I10 Essential (primary) hypertension: Secondary | ICD-10-CM | POA: Diagnosis not present

## 2016-11-01 DIAGNOSIS — N390 Urinary tract infection, site not specified: Secondary | ICD-10-CM | POA: Diagnosis not present

## 2016-11-01 DIAGNOSIS — M109 Gout, unspecified: Secondary | ICD-10-CM | POA: Diagnosis not present

## 2016-11-01 DIAGNOSIS — N135 Crossing vessel and stricture of ureter without hydronephrosis: Secondary | ICD-10-CM | POA: Diagnosis not present

## 2016-11-01 DIAGNOSIS — N1330 Unspecified hydronephrosis: Secondary | ICD-10-CM | POA: Diagnosis not present

## 2016-11-01 DIAGNOSIS — E785 Hyperlipidemia, unspecified: Secondary | ICD-10-CM | POA: Diagnosis not present

## 2016-11-01 DIAGNOSIS — E1169 Type 2 diabetes mellitus with other specified complication: Secondary | ICD-10-CM | POA: Diagnosis not present

## 2016-11-01 DIAGNOSIS — K219 Gastro-esophageal reflux disease without esophagitis: Secondary | ICD-10-CM | POA: Diagnosis not present

## 2016-11-01 DIAGNOSIS — N179 Acute kidney failure, unspecified: Secondary | ICD-10-CM | POA: Diagnosis not present

## 2016-11-02 DIAGNOSIS — K219 Gastro-esophageal reflux disease without esophagitis: Secondary | ICD-10-CM | POA: Diagnosis not present

## 2016-11-02 DIAGNOSIS — I10 Essential (primary) hypertension: Secondary | ICD-10-CM | POA: Diagnosis not present

## 2016-11-02 DIAGNOSIS — N1330 Unspecified hydronephrosis: Secondary | ICD-10-CM | POA: Diagnosis not present

## 2016-11-02 DIAGNOSIS — N10 Acute tubulo-interstitial nephritis: Secondary | ICD-10-CM | POA: Diagnosis not present

## 2016-11-02 DIAGNOSIS — N179 Acute kidney failure, unspecified: Secondary | ICD-10-CM | POA: Diagnosis not present

## 2016-11-02 DIAGNOSIS — E785 Hyperlipidemia, unspecified: Secondary | ICD-10-CM | POA: Diagnosis not present

## 2016-11-02 DIAGNOSIS — E860 Dehydration: Secondary | ICD-10-CM | POA: Diagnosis not present

## 2016-11-02 DIAGNOSIS — M109 Gout, unspecified: Secondary | ICD-10-CM | POA: Diagnosis not present

## 2016-11-02 DIAGNOSIS — E1169 Type 2 diabetes mellitus with other specified complication: Secondary | ICD-10-CM | POA: Diagnosis not present

## 2016-11-02 DIAGNOSIS — N390 Urinary tract infection, site not specified: Secondary | ICD-10-CM | POA: Diagnosis not present

## 2016-11-02 DIAGNOSIS — N135 Crossing vessel and stricture of ureter without hydronephrosis: Secondary | ICD-10-CM | POA: Diagnosis not present

## 2016-12-08 DIAGNOSIS — Q800 Ichthyosis vulgaris: Secondary | ICD-10-CM | POA: Insufficient documentation

## 2016-12-08 DIAGNOSIS — E1110 Type 2 diabetes mellitus with ketoacidosis without coma: Secondary | ICD-10-CM | POA: Insufficient documentation

## 2016-12-08 DIAGNOSIS — D649 Anemia, unspecified: Secondary | ICD-10-CM

## 2016-12-08 DIAGNOSIS — S91302A Unspecified open wound, left foot, initial encounter: Secondary | ICD-10-CM | POA: Insufficient documentation

## 2016-12-08 DIAGNOSIS — E871 Hypo-osmolality and hyponatremia: Secondary | ICD-10-CM | POA: Insufficient documentation

## 2016-12-08 DIAGNOSIS — K219 Gastro-esophageal reflux disease without esophagitis: Secondary | ICD-10-CM | POA: Insufficient documentation

## 2016-12-08 HISTORY — DX: Ichthyosis vulgaris: Q80.0

## 2016-12-08 HISTORY — DX: Unspecified open wound, left foot, initial encounter: S91.302A

## 2016-12-08 HISTORY — DX: Anemia, unspecified: D64.9

## 2016-12-08 HISTORY — DX: Type 2 diabetes mellitus with ketoacidosis without coma: E11.10

## 2016-12-08 HISTORY — DX: Gastro-esophageal reflux disease without esophagitis: K21.9

## 2016-12-23 DIAGNOSIS — N179 Acute kidney failure, unspecified: Secondary | ICD-10-CM | POA: Diagnosis not present

## 2016-12-23 DIAGNOSIS — E11621 Type 2 diabetes mellitus with foot ulcer: Secondary | ICD-10-CM | POA: Diagnosis not present

## 2016-12-23 DIAGNOSIS — G4733 Obstructive sleep apnea (adult) (pediatric): Secondary | ICD-10-CM | POA: Diagnosis not present

## 2016-12-23 DIAGNOSIS — E785 Hyperlipidemia, unspecified: Secondary | ICD-10-CM | POA: Diagnosis not present

## 2016-12-23 DIAGNOSIS — Z720 Tobacco use: Secondary | ICD-10-CM | POA: Diagnosis not present

## 2016-12-23 DIAGNOSIS — K219 Gastro-esophageal reflux disease without esophagitis: Secondary | ICD-10-CM | POA: Diagnosis not present

## 2016-12-23 DIAGNOSIS — I10 Essential (primary) hypertension: Secondary | ICD-10-CM | POA: Diagnosis not present

## 2016-12-23 DIAGNOSIS — M109 Gout, unspecified: Secondary | ICD-10-CM | POA: Diagnosis not present

## 2016-12-23 DIAGNOSIS — E860 Dehydration: Secondary | ICD-10-CM | POA: Diagnosis not present

## 2016-12-23 DIAGNOSIS — E1310 Other specified diabetes mellitus with ketoacidosis without coma: Secondary | ICD-10-CM | POA: Diagnosis not present

## 2016-12-23 DIAGNOSIS — G40909 Epilepsy, unspecified, not intractable, without status epilepticus: Secondary | ICD-10-CM | POA: Diagnosis not present

## 2016-12-24 DIAGNOSIS — G4733 Obstructive sleep apnea (adult) (pediatric): Secondary | ICD-10-CM | POA: Diagnosis not present

## 2016-12-24 DIAGNOSIS — E11621 Type 2 diabetes mellitus with foot ulcer: Secondary | ICD-10-CM | POA: Diagnosis not present

## 2016-12-24 DIAGNOSIS — E785 Hyperlipidemia, unspecified: Secondary | ICD-10-CM | POA: Diagnosis not present

## 2016-12-24 DIAGNOSIS — K219 Gastro-esophageal reflux disease without esophagitis: Secondary | ICD-10-CM | POA: Diagnosis not present

## 2016-12-24 DIAGNOSIS — N179 Acute kidney failure, unspecified: Secondary | ICD-10-CM | POA: Diagnosis not present

## 2016-12-24 DIAGNOSIS — M109 Gout, unspecified: Secondary | ICD-10-CM | POA: Diagnosis not present

## 2016-12-24 DIAGNOSIS — E1310 Other specified diabetes mellitus with ketoacidosis without coma: Secondary | ICD-10-CM | POA: Diagnosis not present

## 2016-12-24 DIAGNOSIS — I10 Essential (primary) hypertension: Secondary | ICD-10-CM | POA: Diagnosis not present

## 2016-12-24 DIAGNOSIS — E860 Dehydration: Secondary | ICD-10-CM | POA: Diagnosis not present

## 2016-12-24 DIAGNOSIS — G40909 Epilepsy, unspecified, not intractable, without status epilepticus: Secondary | ICD-10-CM | POA: Diagnosis not present

## 2016-12-24 DIAGNOSIS — Z720 Tobacco use: Secondary | ICD-10-CM | POA: Diagnosis not present

## 2016-12-25 DIAGNOSIS — G4733 Obstructive sleep apnea (adult) (pediatric): Secondary | ICD-10-CM | POA: Diagnosis not present

## 2016-12-25 DIAGNOSIS — Z720 Tobacco use: Secondary | ICD-10-CM | POA: Diagnosis not present

## 2016-12-25 DIAGNOSIS — G40909 Epilepsy, unspecified, not intractable, without status epilepticus: Secondary | ICD-10-CM | POA: Diagnosis not present

## 2016-12-25 DIAGNOSIS — M109 Gout, unspecified: Secondary | ICD-10-CM | POA: Diagnosis not present

## 2016-12-25 DIAGNOSIS — I10 Essential (primary) hypertension: Secondary | ICD-10-CM | POA: Diagnosis not present

## 2016-12-25 DIAGNOSIS — E785 Hyperlipidemia, unspecified: Secondary | ICD-10-CM | POA: Diagnosis not present

## 2016-12-25 DIAGNOSIS — E11621 Type 2 diabetes mellitus with foot ulcer: Secondary | ICD-10-CM | POA: Diagnosis not present

## 2016-12-25 DIAGNOSIS — E1310 Other specified diabetes mellitus with ketoacidosis without coma: Secondary | ICD-10-CM | POA: Diagnosis not present

## 2016-12-25 DIAGNOSIS — N179 Acute kidney failure, unspecified: Secondary | ICD-10-CM | POA: Diagnosis not present

## 2016-12-25 DIAGNOSIS — E860 Dehydration: Secondary | ICD-10-CM | POA: Diagnosis not present

## 2016-12-25 DIAGNOSIS — K219 Gastro-esophageal reflux disease without esophagitis: Secondary | ICD-10-CM | POA: Diagnosis not present

## 2016-12-26 DIAGNOSIS — G40909 Epilepsy, unspecified, not intractable, without status epilepticus: Secondary | ICD-10-CM | POA: Diagnosis not present

## 2016-12-26 DIAGNOSIS — K219 Gastro-esophageal reflux disease without esophagitis: Secondary | ICD-10-CM | POA: Diagnosis not present

## 2016-12-26 DIAGNOSIS — E785 Hyperlipidemia, unspecified: Secondary | ICD-10-CM | POA: Diagnosis not present

## 2016-12-26 DIAGNOSIS — E11621 Type 2 diabetes mellitus with foot ulcer: Secondary | ICD-10-CM | POA: Diagnosis not present

## 2016-12-26 DIAGNOSIS — E860 Dehydration: Secondary | ICD-10-CM | POA: Diagnosis not present

## 2016-12-26 DIAGNOSIS — N179 Acute kidney failure, unspecified: Secondary | ICD-10-CM | POA: Diagnosis not present

## 2016-12-26 DIAGNOSIS — E1310 Other specified diabetes mellitus with ketoacidosis without coma: Secondary | ICD-10-CM | POA: Diagnosis not present

## 2016-12-26 DIAGNOSIS — I10 Essential (primary) hypertension: Secondary | ICD-10-CM | POA: Diagnosis not present

## 2016-12-26 DIAGNOSIS — G4733 Obstructive sleep apnea (adult) (pediatric): Secondary | ICD-10-CM | POA: Diagnosis not present

## 2016-12-26 DIAGNOSIS — M109 Gout, unspecified: Secondary | ICD-10-CM | POA: Diagnosis not present

## 2016-12-26 DIAGNOSIS — Z720 Tobacco use: Secondary | ICD-10-CM | POA: Diagnosis not present

## 2016-12-27 DIAGNOSIS — I10 Essential (primary) hypertension: Secondary | ICD-10-CM | POA: Diagnosis not present

## 2016-12-27 DIAGNOSIS — G40909 Epilepsy, unspecified, not intractable, without status epilepticus: Secondary | ICD-10-CM | POA: Diagnosis not present

## 2016-12-27 DIAGNOSIS — E785 Hyperlipidemia, unspecified: Secondary | ICD-10-CM | POA: Diagnosis not present

## 2016-12-27 DIAGNOSIS — K219 Gastro-esophageal reflux disease without esophagitis: Secondary | ICD-10-CM | POA: Diagnosis not present

## 2016-12-27 DIAGNOSIS — E1310 Other specified diabetes mellitus with ketoacidosis without coma: Secondary | ICD-10-CM | POA: Diagnosis not present

## 2016-12-27 DIAGNOSIS — E860 Dehydration: Secondary | ICD-10-CM | POA: Diagnosis not present

## 2016-12-27 DIAGNOSIS — G4733 Obstructive sleep apnea (adult) (pediatric): Secondary | ICD-10-CM | POA: Diagnosis not present

## 2016-12-27 DIAGNOSIS — E11621 Type 2 diabetes mellitus with foot ulcer: Secondary | ICD-10-CM | POA: Diagnosis not present

## 2016-12-27 DIAGNOSIS — M109 Gout, unspecified: Secondary | ICD-10-CM | POA: Diagnosis not present

## 2016-12-27 DIAGNOSIS — Z720 Tobacco use: Secondary | ICD-10-CM | POA: Diagnosis not present

## 2016-12-27 DIAGNOSIS — N179 Acute kidney failure, unspecified: Secondary | ICD-10-CM | POA: Diagnosis not present

## 2017-02-14 DIAGNOSIS — E11621 Type 2 diabetes mellitus with foot ulcer: Secondary | ICD-10-CM

## 2017-02-14 DIAGNOSIS — L97501 Non-pressure chronic ulcer of other part of unspecified foot limited to breakdown of skin: Secondary | ICD-10-CM | POA: Insufficient documentation

## 2017-02-14 DIAGNOSIS — E1100 Type 2 diabetes mellitus with hyperosmolarity without nonketotic hyperglycemic-hyperosmolar coma (NKHHC): Secondary | ICD-10-CM | POA: Insufficient documentation

## 2017-02-14 HISTORY — DX: Type 2 diabetes mellitus with hyperosmolarity without nonketotic hyperglycemic-hyperosmolar coma (NKHHC): E11.00

## 2017-02-14 HISTORY — DX: Type 2 diabetes mellitus with foot ulcer: E11.621

## 2017-03-30 DIAGNOSIS — N179 Acute kidney failure, unspecified: Secondary | ICD-10-CM

## 2017-03-30 DIAGNOSIS — R531 Weakness: Secondary | ICD-10-CM

## 2017-03-30 DIAGNOSIS — E1110 Type 2 diabetes mellitus with ketoacidosis without coma: Secondary | ICD-10-CM

## 2017-03-30 DIAGNOSIS — I130 Hypertensive heart and chronic kidney disease with heart failure and stage 1 through stage 4 chronic kidney disease, or unspecified chronic kidney disease: Secondary | ICD-10-CM

## 2017-03-30 DIAGNOSIS — N183 Chronic kidney disease, stage 3 (moderate): Secondary | ICD-10-CM | POA: Diagnosis not present

## 2017-03-30 DIAGNOSIS — E1122 Type 2 diabetes mellitus with diabetic chronic kidney disease: Secondary | ICD-10-CM | POA: Diagnosis not present

## 2017-03-31 DIAGNOSIS — N179 Acute kidney failure, unspecified: Secondary | ICD-10-CM | POA: Diagnosis not present

## 2017-03-31 DIAGNOSIS — I130 Hypertensive heart and chronic kidney disease with heart failure and stage 1 through stage 4 chronic kidney disease, or unspecified chronic kidney disease: Secondary | ICD-10-CM | POA: Diagnosis not present

## 2017-03-31 DIAGNOSIS — N183 Chronic kidney disease, stage 3 (moderate): Secondary | ICD-10-CM | POA: Diagnosis not present

## 2017-03-31 DIAGNOSIS — E1110 Type 2 diabetes mellitus with ketoacidosis without coma: Secondary | ICD-10-CM | POA: Diagnosis not present

## 2017-03-31 DIAGNOSIS — R531 Weakness: Secondary | ICD-10-CM | POA: Diagnosis not present

## 2017-03-31 DIAGNOSIS — E1122 Type 2 diabetes mellitus with diabetic chronic kidney disease: Secondary | ICD-10-CM | POA: Diagnosis not present

## 2017-04-01 DIAGNOSIS — N179 Acute kidney failure, unspecified: Secondary | ICD-10-CM | POA: Diagnosis not present

## 2017-04-01 DIAGNOSIS — E1122 Type 2 diabetes mellitus with diabetic chronic kidney disease: Secondary | ICD-10-CM | POA: Diagnosis not present

## 2017-04-01 DIAGNOSIS — I130 Hypertensive heart and chronic kidney disease with heart failure and stage 1 through stage 4 chronic kidney disease, or unspecified chronic kidney disease: Secondary | ICD-10-CM | POA: Diagnosis not present

## 2017-04-01 DIAGNOSIS — R531 Weakness: Secondary | ICD-10-CM | POA: Diagnosis not present

## 2017-04-01 DIAGNOSIS — E1110 Type 2 diabetes mellitus with ketoacidosis without coma: Secondary | ICD-10-CM | POA: Diagnosis not present

## 2017-04-01 DIAGNOSIS — N183 Chronic kidney disease, stage 3 (moderate): Secondary | ICD-10-CM | POA: Diagnosis not present

## 2017-04-02 DIAGNOSIS — N179 Acute kidney failure, unspecified: Secondary | ICD-10-CM | POA: Diagnosis not present

## 2017-04-02 DIAGNOSIS — E1122 Type 2 diabetes mellitus with diabetic chronic kidney disease: Secondary | ICD-10-CM | POA: Diagnosis not present

## 2017-04-02 DIAGNOSIS — N183 Chronic kidney disease, stage 3 (moderate): Secondary | ICD-10-CM | POA: Diagnosis not present

## 2017-04-02 DIAGNOSIS — I130 Hypertensive heart and chronic kidney disease with heart failure and stage 1 through stage 4 chronic kidney disease, or unspecified chronic kidney disease: Secondary | ICD-10-CM | POA: Diagnosis not present

## 2017-04-02 DIAGNOSIS — E1110 Type 2 diabetes mellitus with ketoacidosis without coma: Secondary | ICD-10-CM | POA: Diagnosis not present

## 2017-04-02 DIAGNOSIS — R531 Weakness: Secondary | ICD-10-CM | POA: Diagnosis not present

## 2017-06-28 DIAGNOSIS — N179 Acute kidney failure, unspecified: Secondary | ICD-10-CM | POA: Diagnosis not present

## 2017-06-28 DIAGNOSIS — E11621 Type 2 diabetes mellitus with foot ulcer: Secondary | ICD-10-CM

## 2017-06-28 DIAGNOSIS — E1310 Other specified diabetes mellitus with ketoacidosis without coma: Secondary | ICD-10-CM | POA: Diagnosis not present

## 2017-06-29 DIAGNOSIS — E1310 Other specified diabetes mellitus with ketoacidosis without coma: Secondary | ICD-10-CM | POA: Diagnosis not present

## 2017-06-29 DIAGNOSIS — E11621 Type 2 diabetes mellitus with foot ulcer: Secondary | ICD-10-CM | POA: Diagnosis not present

## 2017-06-29 DIAGNOSIS — N179 Acute kidney failure, unspecified: Secondary | ICD-10-CM | POA: Diagnosis not present

## 2017-06-30 DIAGNOSIS — E11621 Type 2 diabetes mellitus with foot ulcer: Secondary | ICD-10-CM | POA: Diagnosis not present

## 2017-06-30 DIAGNOSIS — E1310 Other specified diabetes mellitus with ketoacidosis without coma: Secondary | ICD-10-CM | POA: Diagnosis not present

## 2017-06-30 DIAGNOSIS — N179 Acute kidney failure, unspecified: Secondary | ICD-10-CM | POA: Diagnosis not present

## 2017-07-01 DIAGNOSIS — E1310 Other specified diabetes mellitus with ketoacidosis without coma: Secondary | ICD-10-CM | POA: Diagnosis not present

## 2017-07-01 DIAGNOSIS — E162 Hypoglycemia, unspecified: Secondary | ICD-10-CM | POA: Diagnosis not present

## 2017-07-01 DIAGNOSIS — E11621 Type 2 diabetes mellitus with foot ulcer: Secondary | ICD-10-CM | POA: Diagnosis not present

## 2017-07-01 DIAGNOSIS — N179 Acute kidney failure, unspecified: Secondary | ICD-10-CM | POA: Diagnosis not present

## 2017-07-08 DIAGNOSIS — N492 Inflammatory disorders of scrotum: Secondary | ICD-10-CM

## 2017-07-08 DIAGNOSIS — E785 Hyperlipidemia, unspecified: Secondary | ICD-10-CM | POA: Diagnosis not present

## 2017-07-08 DIAGNOSIS — I509 Heart failure, unspecified: Secondary | ICD-10-CM | POA: Diagnosis not present

## 2017-07-08 DIAGNOSIS — Z720 Tobacco use: Secondary | ICD-10-CM

## 2017-07-08 DIAGNOSIS — N179 Acute kidney failure, unspecified: Secondary | ICD-10-CM

## 2017-07-08 DIAGNOSIS — K219 Gastro-esophageal reflux disease without esophagitis: Secondary | ICD-10-CM | POA: Diagnosis not present

## 2017-07-08 DIAGNOSIS — R509 Fever, unspecified: Secondary | ICD-10-CM

## 2017-07-08 DIAGNOSIS — E1169 Type 2 diabetes mellitus with other specified complication: Secondary | ICD-10-CM | POA: Diagnosis not present

## 2017-07-08 DIAGNOSIS — E860 Dehydration: Secondary | ICD-10-CM | POA: Diagnosis not present

## 2017-07-08 DIAGNOSIS — D72829 Elevated white blood cell count, unspecified: Secondary | ICD-10-CM

## 2017-07-08 DIAGNOSIS — G40909 Epilepsy, unspecified, not intractable, without status epilepticus: Secondary | ICD-10-CM | POA: Diagnosis not present

## 2017-07-08 DIAGNOSIS — I10 Essential (primary) hypertension: Secondary | ICD-10-CM | POA: Diagnosis not present

## 2017-07-09 DIAGNOSIS — I10 Essential (primary) hypertension: Secondary | ICD-10-CM | POA: Diagnosis not present

## 2017-07-09 DIAGNOSIS — R600 Localized edema: Secondary | ICD-10-CM | POA: Diagnosis not present

## 2017-07-09 DIAGNOSIS — R509 Fever, unspecified: Secondary | ICD-10-CM | POA: Diagnosis not present

## 2017-07-09 DIAGNOSIS — K219 Gastro-esophageal reflux disease without esophagitis: Secondary | ICD-10-CM | POA: Diagnosis not present

## 2017-07-09 DIAGNOSIS — G40909 Epilepsy, unspecified, not intractable, without status epilepticus: Secondary | ICD-10-CM | POA: Diagnosis not present

## 2017-07-09 DIAGNOSIS — D72829 Elevated white blood cell count, unspecified: Secondary | ICD-10-CM | POA: Diagnosis not present

## 2017-07-09 DIAGNOSIS — N492 Inflammatory disorders of scrotum: Secondary | ICD-10-CM | POA: Diagnosis not present

## 2017-07-09 DIAGNOSIS — E1169 Type 2 diabetes mellitus with other specified complication: Secondary | ICD-10-CM | POA: Diagnosis not present

## 2017-07-09 DIAGNOSIS — I509 Heart failure, unspecified: Secondary | ICD-10-CM | POA: Diagnosis not present

## 2017-07-09 DIAGNOSIS — N179 Acute kidney failure, unspecified: Secondary | ICD-10-CM | POA: Diagnosis not present

## 2017-07-09 DIAGNOSIS — E860 Dehydration: Secondary | ICD-10-CM | POA: Diagnosis not present

## 2017-07-09 DIAGNOSIS — E785 Hyperlipidemia, unspecified: Secondary | ICD-10-CM | POA: Diagnosis not present

## 2017-07-10 DIAGNOSIS — R509 Fever, unspecified: Secondary | ICD-10-CM | POA: Diagnosis not present

## 2017-07-10 DIAGNOSIS — I10 Essential (primary) hypertension: Secondary | ICD-10-CM | POA: Diagnosis not present

## 2017-07-10 DIAGNOSIS — N179 Acute kidney failure, unspecified: Secondary | ICD-10-CM | POA: Diagnosis not present

## 2017-07-10 DIAGNOSIS — D72829 Elevated white blood cell count, unspecified: Secondary | ICD-10-CM | POA: Diagnosis not present

## 2017-07-10 DIAGNOSIS — G40909 Epilepsy, unspecified, not intractable, without status epilepticus: Secondary | ICD-10-CM | POA: Diagnosis not present

## 2017-07-10 DIAGNOSIS — I509 Heart failure, unspecified: Secondary | ICD-10-CM | POA: Diagnosis not present

## 2017-07-10 DIAGNOSIS — K219 Gastro-esophageal reflux disease without esophagitis: Secondary | ICD-10-CM | POA: Diagnosis not present

## 2017-07-10 DIAGNOSIS — E785 Hyperlipidemia, unspecified: Secondary | ICD-10-CM | POA: Diagnosis not present

## 2017-07-10 DIAGNOSIS — E860 Dehydration: Secondary | ICD-10-CM | POA: Diagnosis not present

## 2017-07-10 DIAGNOSIS — N492 Inflammatory disorders of scrotum: Secondary | ICD-10-CM | POA: Diagnosis not present

## 2017-07-10 DIAGNOSIS — E1169 Type 2 diabetes mellitus with other specified complication: Secondary | ICD-10-CM | POA: Diagnosis not present

## 2017-07-11 DIAGNOSIS — G40909 Epilepsy, unspecified, not intractable, without status epilepticus: Secondary | ICD-10-CM | POA: Diagnosis not present

## 2017-07-11 DIAGNOSIS — R509 Fever, unspecified: Secondary | ICD-10-CM | POA: Diagnosis not present

## 2017-07-11 DIAGNOSIS — E785 Hyperlipidemia, unspecified: Secondary | ICD-10-CM | POA: Diagnosis not present

## 2017-07-11 DIAGNOSIS — N179 Acute kidney failure, unspecified: Secondary | ICD-10-CM | POA: Diagnosis not present

## 2017-07-11 DIAGNOSIS — I509 Heart failure, unspecified: Secondary | ICD-10-CM | POA: Diagnosis not present

## 2017-07-11 DIAGNOSIS — D72829 Elevated white blood cell count, unspecified: Secondary | ICD-10-CM | POA: Diagnosis not present

## 2017-07-11 DIAGNOSIS — N492 Inflammatory disorders of scrotum: Secondary | ICD-10-CM | POA: Diagnosis not present

## 2017-07-11 DIAGNOSIS — E1169 Type 2 diabetes mellitus with other specified complication: Secondary | ICD-10-CM | POA: Diagnosis not present

## 2017-07-11 DIAGNOSIS — K219 Gastro-esophageal reflux disease without esophagitis: Secondary | ICD-10-CM | POA: Diagnosis not present

## 2017-07-11 DIAGNOSIS — I10 Essential (primary) hypertension: Secondary | ICD-10-CM | POA: Diagnosis not present

## 2017-07-11 DIAGNOSIS — E860 Dehydration: Secondary | ICD-10-CM | POA: Diagnosis not present

## 2017-07-12 DIAGNOSIS — N492 Inflammatory disorders of scrotum: Secondary | ICD-10-CM | POA: Diagnosis not present

## 2017-07-12 DIAGNOSIS — N179 Acute kidney failure, unspecified: Secondary | ICD-10-CM | POA: Diagnosis not present

## 2017-07-13 DIAGNOSIS — N492 Inflammatory disorders of scrotum: Secondary | ICD-10-CM | POA: Diagnosis not present

## 2017-07-13 DIAGNOSIS — N179 Acute kidney failure, unspecified: Secondary | ICD-10-CM | POA: Diagnosis not present

## 2017-07-14 DIAGNOSIS — N492 Inflammatory disorders of scrotum: Secondary | ICD-10-CM | POA: Diagnosis not present

## 2017-07-14 DIAGNOSIS — N179 Acute kidney failure, unspecified: Secondary | ICD-10-CM | POA: Diagnosis not present

## 2017-07-15 DIAGNOSIS — N492 Inflammatory disorders of scrotum: Secondary | ICD-10-CM | POA: Diagnosis not present

## 2017-07-15 DIAGNOSIS — N179 Acute kidney failure, unspecified: Secondary | ICD-10-CM | POA: Diagnosis not present

## 2017-07-16 DIAGNOSIS — N179 Acute kidney failure, unspecified: Secondary | ICD-10-CM | POA: Diagnosis not present

## 2017-07-16 DIAGNOSIS — N492 Inflammatory disorders of scrotum: Secondary | ICD-10-CM | POA: Diagnosis not present

## 2017-07-17 DIAGNOSIS — N492 Inflammatory disorders of scrotum: Secondary | ICD-10-CM | POA: Diagnosis not present

## 2017-07-17 DIAGNOSIS — A045 Campylobacter enteritis: Secondary | ICD-10-CM

## 2017-07-17 DIAGNOSIS — N179 Acute kidney failure, unspecified: Secondary | ICD-10-CM | POA: Diagnosis not present

## 2017-07-18 DIAGNOSIS — N179 Acute kidney failure, unspecified: Secondary | ICD-10-CM | POA: Diagnosis not present

## 2017-07-18 DIAGNOSIS — A045 Campylobacter enteritis: Secondary | ICD-10-CM | POA: Diagnosis not present

## 2017-07-18 DIAGNOSIS — N492 Inflammatory disorders of scrotum: Secondary | ICD-10-CM | POA: Diagnosis not present

## 2018-01-21 DIAGNOSIS — W19XXXA Unspecified fall, initial encounter: Secondary | ICD-10-CM | POA: Insufficient documentation

## 2018-01-21 DIAGNOSIS — S72142A Displaced intertrochanteric fracture of left femur, initial encounter for closed fracture: Secondary | ICD-10-CM

## 2018-01-21 DIAGNOSIS — S82202A Unspecified fracture of shaft of left tibia, initial encounter for closed fracture: Secondary | ICD-10-CM

## 2018-01-21 DIAGNOSIS — S52202A Unspecified fracture of shaft of left ulna, initial encounter for closed fracture: Secondary | ICD-10-CM | POA: Insufficient documentation

## 2018-01-21 DIAGNOSIS — T1490XA Injury, unspecified, initial encounter: Secondary | ICD-10-CM

## 2018-01-21 HISTORY — DX: Displaced intertrochanteric fracture of left femur, initial encounter for closed fracture: S72.142A

## 2018-01-21 HISTORY — DX: Injury, unspecified, initial encounter: T14.90XA

## 2018-01-21 HISTORY — DX: Unspecified fall, initial encounter: W19.XXXA

## 2018-01-21 HISTORY — DX: Unspecified fracture of shaft of left ulna, initial encounter for closed fracture: S52.202A

## 2018-01-21 HISTORY — DX: Unspecified fracture of shaft of left tibia, initial encounter for closed fracture: S82.202A

## 2018-01-24 HISTORY — PX: NEPHRECTOMY: SHX65

## 2018-03-08 DIAGNOSIS — N179 Acute kidney failure, unspecified: Secondary | ICD-10-CM | POA: Insufficient documentation

## 2018-03-08 DIAGNOSIS — R5381 Other malaise: Secondary | ICD-10-CM | POA: Insufficient documentation

## 2018-03-08 DIAGNOSIS — E161 Other hypoglycemia: Secondary | ICD-10-CM

## 2018-03-08 DIAGNOSIS — I10 Essential (primary) hypertension: Secondary | ICD-10-CM | POA: Insufficient documentation

## 2018-03-08 DIAGNOSIS — N209 Urinary calculus, unspecified: Secondary | ICD-10-CM

## 2018-03-08 DIAGNOSIS — L89102 Pressure ulcer of unspecified part of back, stage 2: Secondary | ICD-10-CM | POA: Insufficient documentation

## 2018-03-08 HISTORY — DX: Essential (primary) hypertension: I10

## 2018-03-08 HISTORY — DX: Other hypoglycemia: E16.1

## 2018-03-08 HISTORY — DX: Other malaise: R53.81

## 2018-03-08 HISTORY — DX: Urinary calculus, unspecified: N20.9

## 2018-03-08 HISTORY — DX: Pressure ulcer of unspecified part of back, stage 2: L89.102

## 2018-03-14 DIAGNOSIS — R410 Disorientation, unspecified: Secondary | ICD-10-CM

## 2018-03-14 DIAGNOSIS — E7220 Disorder of urea cycle metabolism, unspecified: Secondary | ICD-10-CM | POA: Insufficient documentation

## 2018-03-14 HISTORY — DX: Disorientation, unspecified: R41.0

## 2018-03-16 DIAGNOSIS — Z789 Other specified health status: Secondary | ICD-10-CM

## 2018-03-16 HISTORY — DX: Other specified health status: Z78.9

## 2018-03-20 DIAGNOSIS — S42302D Unspecified fracture of shaft of humerus, left arm, subsequent encounter for fracture with routine healing: Secondary | ICD-10-CM | POA: Insufficient documentation

## 2018-03-20 HISTORY — DX: Unspecified fracture of shaft of humerus, left arm, subsequent encounter for fracture with routine healing: S42.302D

## 2018-03-25 DIAGNOSIS — E876 Hypokalemia: Secondary | ICD-10-CM

## 2018-03-25 DIAGNOSIS — E785 Hyperlipidemia, unspecified: Secondary | ICD-10-CM

## 2018-03-25 DIAGNOSIS — I10 Essential (primary) hypertension: Secondary | ICD-10-CM

## 2018-03-25 DIAGNOSIS — N390 Urinary tract infection, site not specified: Secondary | ICD-10-CM

## 2018-03-25 DIAGNOSIS — E860 Dehydration: Secondary | ICD-10-CM | POA: Diagnosis not present

## 2018-03-25 DIAGNOSIS — E1169 Type 2 diabetes mellitus with other specified complication: Secondary | ICD-10-CM

## 2018-03-25 DIAGNOSIS — K219 Gastro-esophageal reflux disease without esophagitis: Secondary | ICD-10-CM

## 2018-03-25 DIAGNOSIS — R4182 Altered mental status, unspecified: Secondary | ICD-10-CM

## 2018-03-26 DIAGNOSIS — R4182 Altered mental status, unspecified: Secondary | ICD-10-CM | POA: Diagnosis not present

## 2018-03-26 DIAGNOSIS — N390 Urinary tract infection, site not specified: Secondary | ICD-10-CM | POA: Diagnosis not present

## 2018-03-26 DIAGNOSIS — E876 Hypokalemia: Secondary | ICD-10-CM | POA: Diagnosis not present

## 2018-03-27 DIAGNOSIS — R4182 Altered mental status, unspecified: Secondary | ICD-10-CM | POA: Diagnosis not present

## 2018-03-27 DIAGNOSIS — N390 Urinary tract infection, site not specified: Secondary | ICD-10-CM | POA: Diagnosis not present

## 2018-03-27 DIAGNOSIS — E876 Hypokalemia: Secondary | ICD-10-CM | POA: Diagnosis not present

## 2018-04-05 DIAGNOSIS — E785 Hyperlipidemia, unspecified: Secondary | ICD-10-CM

## 2018-04-05 DIAGNOSIS — E1169 Type 2 diabetes mellitus with other specified complication: Secondary | ICD-10-CM

## 2018-04-05 DIAGNOSIS — Z720 Tobacco use: Secondary | ICD-10-CM

## 2018-04-05 DIAGNOSIS — N179 Acute kidney failure, unspecified: Secondary | ICD-10-CM

## 2018-04-05 DIAGNOSIS — G40909 Epilepsy, unspecified, not intractable, without status epilepticus: Secondary | ICD-10-CM

## 2018-04-05 DIAGNOSIS — E876 Hypokalemia: Secondary | ICD-10-CM

## 2018-04-05 DIAGNOSIS — E860 Dehydration: Secondary | ICD-10-CM

## 2018-04-05 DIAGNOSIS — K219 Gastro-esophageal reflux disease without esophagitis: Secondary | ICD-10-CM

## 2018-04-05 DIAGNOSIS — K7290 Hepatic failure, unspecified without coma: Secondary | ICD-10-CM

## 2018-04-06 DIAGNOSIS — E860 Dehydration: Secondary | ICD-10-CM | POA: Diagnosis not present

## 2018-04-06 DIAGNOSIS — N179 Acute kidney failure, unspecified: Secondary | ICD-10-CM | POA: Diagnosis not present

## 2018-04-06 DIAGNOSIS — K7290 Hepatic failure, unspecified without coma: Secondary | ICD-10-CM | POA: Diagnosis not present

## 2018-04-07 ENCOUNTER — Inpatient Hospital Stay (HOSPITAL_COMMUNITY)
Admission: AD | Admit: 2018-04-07 | Discharge: 2018-04-14 | DRG: 871 | Disposition: A | Discharge status: Skilled Nursing Facility | Payer: Medicare Other | Source: Other Acute Inpatient Hospital | Attending: Internal Medicine | Admitting: Internal Medicine

## 2018-04-07 ENCOUNTER — Inpatient Hospital Stay (HOSPITAL_COMMUNITY): Payer: Medicare Other

## 2018-04-07 DIAGNOSIS — E1121 Type 2 diabetes mellitus with diabetic nephropathy: Secondary | ICD-10-CM | POA: Diagnosis not present

## 2018-04-07 DIAGNOSIS — N400 Enlarged prostate without lower urinary tract symptoms: Secondary | ICD-10-CM | POA: Diagnosis present

## 2018-04-07 DIAGNOSIS — R4182 Altered mental status, unspecified: Secondary | ICD-10-CM | POA: Diagnosis not present

## 2018-04-07 DIAGNOSIS — G40909 Epilepsy, unspecified, not intractable, without status epilepticus: Secondary | ICD-10-CM | POA: Diagnosis not present

## 2018-04-07 DIAGNOSIS — E1140 Type 2 diabetes mellitus with diabetic neuropathy, unspecified: Secondary | ICD-10-CM | POA: Diagnosis present

## 2018-04-07 DIAGNOSIS — E874 Mixed disorder of acid-base balance: Secondary | ICD-10-CM | POA: Diagnosis present

## 2018-04-07 DIAGNOSIS — Z6841 Body Mass Index (BMI) 40.0 and over, adult: Secondary | ICD-10-CM

## 2018-04-07 DIAGNOSIS — R5381 Other malaise: Secondary | ICD-10-CM | POA: Diagnosis not present

## 2018-04-07 DIAGNOSIS — J9600 Acute respiratory failure, unspecified whether with hypoxia or hypercapnia: Secondary | ICD-10-CM

## 2018-04-07 DIAGNOSIS — N390 Urinary tract infection, site not specified: Secondary | ICD-10-CM | POA: Diagnosis not present

## 2018-04-07 DIAGNOSIS — D631 Anemia in chronic kidney disease: Secondary | ICD-10-CM | POA: Diagnosis present

## 2018-04-07 DIAGNOSIS — K7469 Other cirrhosis of liver: Secondary | ICD-10-CM | POA: Diagnosis not present

## 2018-04-07 DIAGNOSIS — A419 Sepsis, unspecified organism: Secondary | ICD-10-CM

## 2018-04-07 DIAGNOSIS — K7200 Acute and subacute hepatic failure without coma: Secondary | ICD-10-CM | POA: Diagnosis present

## 2018-04-07 DIAGNOSIS — N184 Chronic kidney disease, stage 4 (severe): Secondary | ICD-10-CM | POA: Diagnosis present

## 2018-04-07 DIAGNOSIS — E1122 Type 2 diabetes mellitus with diabetic chronic kidney disease: Secondary | ICD-10-CM | POA: Diagnosis present

## 2018-04-07 DIAGNOSIS — Z794 Long term (current) use of insulin: Secondary | ICD-10-CM

## 2018-04-07 DIAGNOSIS — G9341 Metabolic encephalopathy: Secondary | ICD-10-CM | POA: Diagnosis not present

## 2018-04-07 DIAGNOSIS — Z79899 Other long term (current) drug therapy: Secondary | ICD-10-CM | POA: Diagnosis not present

## 2018-04-07 DIAGNOSIS — B961 Klebsiella pneumoniae [K. pneumoniae] as the cause of diseases classified elsewhere: Secondary | ICD-10-CM | POA: Diagnosis present

## 2018-04-07 DIAGNOSIS — R6521 Severe sepsis with septic shock: Secondary | ICD-10-CM | POA: Diagnosis not present

## 2018-04-07 DIAGNOSIS — N189 Chronic kidney disease, unspecified: Secondary | ICD-10-CM | POA: Diagnosis not present

## 2018-04-07 DIAGNOSIS — R7989 Other specified abnormal findings of blood chemistry: Secondary | ICD-10-CM | POA: Diagnosis not present

## 2018-04-07 DIAGNOSIS — Z992 Dependence on renal dialysis: Secondary | ICD-10-CM

## 2018-04-07 DIAGNOSIS — E1165 Type 2 diabetes mellitus with hyperglycemia: Secondary | ICD-10-CM | POA: Diagnosis present

## 2018-04-07 DIAGNOSIS — Z781 Physical restraint status: Secondary | ICD-10-CM | POA: Diagnosis not present

## 2018-04-07 DIAGNOSIS — D638 Anemia in other chronic diseases classified elsewhere: Secondary | ICD-10-CM | POA: Diagnosis present

## 2018-04-07 DIAGNOSIS — A4159 Other Gram-negative sepsis: Secondary | ICD-10-CM | POA: Diagnosis present

## 2018-04-07 DIAGNOSIS — L850 Acquired ichthyosis: Secondary | ICD-10-CM | POA: Diagnosis present

## 2018-04-07 DIAGNOSIS — E785 Hyperlipidemia, unspecified: Secondary | ICD-10-CM | POA: Diagnosis not present

## 2018-04-07 DIAGNOSIS — E869 Volume depletion, unspecified: Secondary | ICD-10-CM | POA: Diagnosis present

## 2018-04-07 DIAGNOSIS — N179 Acute kidney failure, unspecified: Secondary | ICD-10-CM | POA: Diagnosis not present

## 2018-04-07 DIAGNOSIS — J9601 Acute respiratory failure with hypoxia: Secondary | ICD-10-CM | POA: Diagnosis not present

## 2018-04-07 DIAGNOSIS — K219 Gastro-esophageal reflux disease without esophagitis: Secondary | ICD-10-CM | POA: Diagnosis present

## 2018-04-07 DIAGNOSIS — K7460 Unspecified cirrhosis of liver: Secondary | ICD-10-CM | POA: Diagnosis present

## 2018-04-07 DIAGNOSIS — Z9119 Patient's noncompliance with other medical treatment and regimen: Secondary | ICD-10-CM

## 2018-04-07 DIAGNOSIS — L309 Dermatitis, unspecified: Secondary | ICD-10-CM | POA: Diagnosis present

## 2018-04-07 DIAGNOSIS — K2960 Other gastritis without bleeding: Secondary | ICD-10-CM | POA: Diagnosis not present

## 2018-04-07 DIAGNOSIS — E876 Hypokalemia: Secondary | ICD-10-CM | POA: Diagnosis present

## 2018-04-07 DIAGNOSIS — N186 End stage renal disease: Secondary | ICD-10-CM | POA: Diagnosis present

## 2018-04-07 DIAGNOSIS — I129 Hypertensive chronic kidney disease with stage 1 through stage 4 chronic kidney disease, or unspecified chronic kidney disease: Secondary | ICD-10-CM | POA: Diagnosis present

## 2018-04-07 DIAGNOSIS — D6959 Other secondary thrombocytopenia: Secondary | ICD-10-CM | POA: Diagnosis present

## 2018-04-07 DIAGNOSIS — I10 Essential (primary) hypertension: Secondary | ICD-10-CM | POA: Diagnosis not present

## 2018-04-07 DIAGNOSIS — G9340 Encephalopathy, unspecified: Secondary | ICD-10-CM | POA: Diagnosis not present

## 2018-04-07 DIAGNOSIS — D6489 Other specified anemias: Secondary | ICD-10-CM | POA: Diagnosis present

## 2018-04-07 DIAGNOSIS — F79 Unspecified intellectual disabilities: Secondary | ICD-10-CM | POA: Diagnosis present

## 2018-04-07 DIAGNOSIS — R069 Unspecified abnormalities of breathing: Secondary | ICD-10-CM

## 2018-04-07 DIAGNOSIS — Z9289 Personal history of other medical treatment: Secondary | ICD-10-CM

## 2018-04-07 HISTORY — DX: Anemia in chronic kidney disease: D63.1

## 2018-04-07 LAB — CBC WITH DIFFERENTIAL/PLATELET
Abs Immature Granulocytes: 0.09 10*3/uL — ABNORMAL HIGH (ref 0.00–0.07)
Basophils Absolute: 0 10*3/uL (ref 0.0–0.1)
Basophils Relative: 0 %
EOS PCT: 1 %
Eosinophils Absolute: 0 10*3/uL (ref 0.0–0.5)
HCT: 22.7 % — ABNORMAL LOW (ref 39.0–52.0)
HEMOGLOBIN: 6.6 g/dL — AB (ref 13.0–17.0)
Immature Granulocytes: 2 %
Lymphocytes Relative: 15 %
Lymphs Abs: 0.7 10*3/uL (ref 0.7–4.0)
MCH: 28.2 pg (ref 26.0–34.0)
MCHC: 29.1 g/dL — AB (ref 30.0–36.0)
MCV: 97 fL (ref 80.0–100.0)
Monocytes Absolute: 0.4 10*3/uL (ref 0.1–1.0)
Monocytes Relative: 9 %
Neutro Abs: 3.3 10*3/uL (ref 1.7–7.7)
Neutrophils Relative %: 73 %
Platelets: DECREASED 10*3/uL (ref 150–400)
RBC: 2.34 MIL/uL — ABNORMAL LOW (ref 4.22–5.81)
RDW: 17.3 % — ABNORMAL HIGH (ref 11.5–15.5)
WBC: 4.6 10*3/uL (ref 4.0–10.5)
nRBC: 2.6 % — ABNORMAL HIGH (ref 0.0–0.2)

## 2018-04-07 LAB — MRSA PCR SCREENING: MRSA BY PCR: NEGATIVE

## 2018-04-07 LAB — GLUCOSE, CAPILLARY: GLUCOSE-CAPILLARY: 109 mg/dL — AB (ref 70–99)

## 2018-04-07 LAB — LACTIC ACID, PLASMA
LACTIC ACID, VENOUS: 0.6 mmol/L (ref 0.5–1.9)
Lactic Acid, Venous: 0.7 mmol/L (ref 0.5–1.9)

## 2018-04-07 LAB — CORTISOL: Cortisol, Plasma: 13.4 ug/dL

## 2018-04-07 LAB — POCT I-STAT 7, (LYTES, BLD GAS, ICA,H+H)
Acid-base deficit: 14 mmol/L — ABNORMAL HIGH (ref 0.0–2.0)
Bicarbonate: 14.7 mmol/L — ABNORMAL LOW (ref 20.0–28.0)
Calcium, Ion: 1 mmol/L — ABNORMAL LOW (ref 1.15–1.40)
HCT: 21 % — ABNORMAL LOW (ref 39.0–52.0)
HEMOGLOBIN: 7.1 g/dL — AB (ref 13.0–17.0)
O2 Saturation: 97 %
Patient temperature: 98.6
Potassium: 3.1 mmol/L — ABNORMAL LOW (ref 3.5–5.1)
Sodium: 140 mmol/L (ref 135–145)
TCO2: 16 mmol/L — ABNORMAL LOW (ref 22–32)
pCO2 arterial: 47.5 mmHg (ref 32.0–48.0)
pH, Arterial: 7.1 — CL (ref 7.350–7.450)
pO2, Arterial: 119 mmHg — ABNORMAL HIGH (ref 83.0–108.0)

## 2018-04-07 LAB — COMPREHENSIVE METABOLIC PANEL
ALBUMIN: 2.3 g/dL — AB (ref 3.5–5.0)
ALT: 9 U/L (ref 0–44)
AST: 8 U/L — ABNORMAL LOW (ref 15–41)
Alkaline Phosphatase: 131 U/L — ABNORMAL HIGH (ref 38–126)
Anion gap: 10 (ref 5–15)
BILIRUBIN TOTAL: 0.6 mg/dL (ref 0.3–1.2)
BUN: 46 mg/dL — ABNORMAL HIGH (ref 6–20)
CO2: 15 mmol/L — ABNORMAL LOW (ref 22–32)
Calcium: 6.3 mg/dL — CL (ref 8.9–10.3)
Chloride: 111 mmol/L (ref 98–111)
Creatinine, Ser: 7.45 mg/dL — ABNORMAL HIGH (ref 0.61–1.24)
GFR calc Af Amer: 9 mL/min — ABNORMAL LOW (ref >60)
GFR, EST NON AFRICAN AMERICAN: 8 mL/min — AB (ref >60)
Glucose, Bld: 119 mg/dL — ABNORMAL HIGH (ref 70–99)
Potassium: 2.9 mmol/L — ABNORMAL LOW (ref 3.5–5.1)
Sodium: 136 mmol/L (ref 135–145)
TOTAL PROTEIN: 5.7 g/dL — AB (ref 6.5–8.1)

## 2018-04-07 LAB — ABO/RH: ABO/RH(D): A POS

## 2018-04-07 LAB — MAGNESIUM: MAGNESIUM: 2.3 mg/dL (ref 1.7–2.4)

## 2018-04-07 LAB — AMMONIA: Ammonia: 50 umol/L — ABNORMAL HIGH (ref 9–35)

## 2018-04-07 LAB — LIPASE, BLOOD: Lipase: 17 U/L (ref 11–51)

## 2018-04-07 LAB — PHOSPHORUS: Phosphorus: >30 mg/dL — ABNORMAL HIGH (ref 2.5–4.6)

## 2018-04-07 MED ORDER — NOREPINEPHRINE 4 MG/250ML-% IV SOLN
0.0000 ug/min | INTRAVENOUS | Status: DC
  Administered 2018-04-07: 5 ug/min via INTRAVENOUS
  Administered 2018-04-08: 3 ug/min via INTRAVENOUS
  Administered 2018-04-09: 2 ug/min via INTRAVENOUS
  Filled 2018-04-07 (×2): qty 250

## 2018-04-07 MED ORDER — INSULIN ASPART 100 UNIT/ML ~~LOC~~ SOLN
0.0000 [IU] | SUBCUTANEOUS | Status: DC
  Administered 2018-04-08: 3 [IU] via SUBCUTANEOUS
  Administered 2018-04-09: 4 [IU] via SUBCUTANEOUS
  Administered 2018-04-09 (×2): 3 [IU] via SUBCUTANEOUS
  Administered 2018-04-09: 4 [IU] via SUBCUTANEOUS
  Administered 2018-04-10: 7 [IU] via SUBCUTANEOUS
  Administered 2018-04-10 (×2): 4 [IU] via SUBCUTANEOUS

## 2018-04-07 MED ORDER — HEPARIN SODIUM (PORCINE) 5000 UNIT/ML IJ SOLN
5000.0000 [IU] | Freq: Three times a day (TID) | INTRAMUSCULAR | Status: DC
  Administered 2018-04-07 – 2018-04-14 (×19): 5000 [IU] via SUBCUTANEOUS
  Filled 2018-04-07 (×19): qty 1

## 2018-04-07 MED ORDER — FAMOTIDINE 20 MG IN NS 100 ML IVPB
20.0000 mg | Freq: Two times a day (BID) | INTRAVENOUS | Status: DC
  Administered 2018-04-07 – 2018-04-09 (×4): 20 mg via INTRAVENOUS
  Filled 2018-04-07 (×4): qty 100

## 2018-04-07 MED ORDER — SODIUM CHLORIDE 0.9 % IV SOLN: 500.0000 mg | INTRAVENOUS | Status: DC

## 2018-04-07 MED ORDER — FENTANYL CITRATE (PF) 100 MCG/2ML IJ SOLN
100.0000 ug | INTRAMUSCULAR | Status: DC | PRN
  Administered 2018-04-08 – 2018-04-09 (×3): 100 ug via INTRAVENOUS
  Filled 2018-04-07 (×4): qty 2

## 2018-04-07 MED ORDER — CARBAMAZEPINE 100 MG/5ML PO SUSP
400.0000 mg | Freq: Three times a day (TID) | ORAL | Status: DC
  Administered 2018-04-07 – 2018-04-11 (×12): 400 mg via ORAL
  Filled 2018-04-07 (×13): qty 20

## 2018-04-07 MED ORDER — CHLORHEXIDINE GLUCONATE 0.12% ORAL RINSE (MEDLINE KIT)
15.0000 mL | Freq: Two times a day (BID) | OROMUCOSAL | Status: DC
  Administered 2018-04-07 – 2018-04-10 (×6): 15 mL via OROMUCOSAL

## 2018-04-07 MED ORDER — SODIUM CHLORIDE 0.9 % IV SOLN
1.0000 g | Freq: Once | INTRAVENOUS | Status: AC
  Administered 2018-04-07: 1 g via INTRAVENOUS
  Filled 2018-04-07: qty 1

## 2018-04-07 MED ORDER — ORAL CARE MOUTH RINSE
15.0000 mL | OROMUCOSAL | Status: DC
  Administered 2018-04-07 – 2018-04-10 (×28): 15 mL via OROMUCOSAL

## 2018-04-07 MED ORDER — RIFAXIMIN 550 MG PO TABS
550.0000 mg | ORAL_TABLET | Freq: Two times a day (BID) | ORAL | Status: DC
  Administered 2018-04-07 – 2018-04-11 (×8): 550 mg
  Filled 2018-04-07 (×8): qty 1

## 2018-04-07 MED ORDER — SODIUM CHLORIDE 0.9 % IV SOLN
INTRAVENOUS | Status: DC
  Administered 2018-04-07: 20:00:00 via INTRAVENOUS

## 2018-04-07 MED ORDER — SODIUM CHLORIDE 0.9 % IV BOLUS
1000.0000 mL | Freq: Once | INTRAVENOUS | Status: AC
  Administered 2018-04-07: 1000 mL via INTRAVENOUS

## 2018-04-07 MED ORDER — LACTULOSE 10 GM/15ML PO SOLN
30.0000 g | Freq: Three times a day (TID) | ORAL | Status: DC
  Administered 2018-04-07 – 2018-04-08 (×4): 30 g
  Filled 2018-04-07 (×5): qty 45

## 2018-04-07 MED ORDER — SODIUM CHLORIDE 0.9% IV SOLUTION: Freq: Once | INTRAVENOUS | Status: DC

## 2018-04-07 MED ORDER — SODIUM CHLORIDE 0.9 % IV SOLN
250.0000 mL | INTRAVENOUS | Status: DC
  Administered 2018-04-07 – 2018-04-13 (×2): 250 mL via INTRAVENOUS

## 2018-04-07 MED ORDER — POTASSIUM CHLORIDE 10 MEQ/100ML IV SOLN
10.0000 meq | INTRAVENOUS | Status: AC
  Administered 2018-04-07 – 2018-04-08 (×3): 10 meq via INTRAVENOUS
  Filled 2018-04-07 (×3): qty 100

## 2018-04-07 MED ORDER — FENTANYL CITRATE (PF) 100 MCG/2ML IJ SOLN
100.0000 ug | INTRAMUSCULAR | Status: AC | PRN
  Administered 2018-04-07 – 2018-04-08 (×3): 100 ug via INTRAVENOUS
  Filled 2018-04-07 (×2): qty 2

## 2018-04-07 MED ORDER — STERILE WATER FOR INJECTION IV SOLN
INTRAVENOUS | Status: DC
  Administered 2018-04-07 – 2018-04-08 (×3): via INTRAVENOUS
  Filled 2018-04-07 (×6): qty 850

## 2018-04-07 NOTE — Progress Notes (Signed)
This RN wasted 80cc of fentanyl in the sink. Witnessed by Vivia Ewing, RN.  Clint Bolder, RN 04/07/18 11:49 PM

## 2018-04-07 NOTE — Progress Notes (Signed)
Pharmacy Antibiotic Note  Eddie Davies is a 47 y.o. male admitted on 04/07/2018 with sepsis.  Pharmacy has been consulted for Meropenem dosing.  Patient transferred from [facility] for possible dialysis.  Found to be septic from klebsiella UTI.  Plan: Merropenem 2 gms IV x 1, then 500 mg IV q24hr Monitor renal function and dialysis, C&S   Temp (24hrs), Avg:91.4 F (33 C), Min:91.1 F (32.8 C), Max:91.6 F (33.1 C)  No results for input(s): WBC, CREATININE, LATICACIDVEN, VANCOTROUGH, VANCOPEAK, VANCORANDOM, GENTTROUGH, GENTPEAK, GENTRANDOM, TOBRATROUGH, TOBRAPEAK, TOBRARND, AMIKACINPEAK, AMIKACINTROU, AMIKACIN in the last 168 hours.  CrCl cannot be calculated (No successful lab value found.).    Not on File  Antimicrobials this admission: Merropenem 3/14 >>   Thank you for allowing pharmacy to be a part of this patient's care.  Alanda Slim, PharmD, [facility] Clinical Pharmacist Please see AMION for all Pharmacists' Contact Phone Numbers 04/07/2018, 7:27 PM

## 2018-04-07 NOTE — Progress Notes (Signed)
This RN attempted to call [facility] for blood consent. No answer, left message to return call to the unit.  Clint Bolder, RN 04/07/18 9:48 PM

## 2018-04-07 NOTE — H&P (Signed)
NAME:  Eddie Davies, MRN:  [id number], DOB:  11/07/1970, LOS: 0 ADMISSION DATE:  04/07/2018, CONSULTATION DATE:  3/14 REFERRING MD:  Graylon Good (from [facility]), CHIEF COMPLAINT: Altered mental status, and hepatic encephalopathy  Brief History   48 year old male patient who resides at skilled nursing facility due to cognitive delay.  Has underlying chronic liver disease, as well as chronic kidney disease stage IV.  Admitted 3/13 in [place] with altered sensorium and ammonia level of 195 as well as creatinine of 6.3.  Was admitted to the hospitalist service.  Further evaluate diagnostics revealed urinary tract infection with Klebsiella and enterococcus isolated.  He developed progressive neurological decline, worsening blood pressure/hypotension as well as worsening renal failure and metabolic acidosis and because of this he was transferred to [facility] for nephrology consultation and probable CRRT  History of present illness   This is a 48 year old male patient who resides in a skilled nursing facility with a significant history as mentioned below most notably chronic liver disease, end-stage for CKD.  Apparently has lab work checked quite regularly at a skilled nursing facility, and was admitted to [facility] on 3/13 with chief complaint of altered sensorium and newly identified elevated ammonia level at 195.  He was admitted to the hospitalist service on presentation he was awake but interactive, systolic blood pressure 784/69 notable additional labs as follows: Bicarbonate level 11 anion gap 19 creatinine 6.3 glucose 330 hemoglobin 7.3 blood glucose 338 in the emergency room he was treated with lactulose and rifaximin mean he was admitted for further evaluation and therapy.  Over the next 24 hours serum creatinine continued to decline, further evaluation demonstrated worsening blood pressure, urinary tract infection with Klebsiella & Enterococcus isolated: He was transferred to  [facility] for  Past Medical History  Cognitive delay, history of chronic liver disease, CKD stage IV, hypertension, seizure disorder, type 2 diabetes, history of medical noncompliance.  Significant Hospital Events   3/13 admitted to [facility] for acute renal failure, ammonia level of 195, and altered sensorium. 3/14: Renal failure worse, had risen from 6.3 up to 7.4.  Mental status continued to decline, became more hypotensive and was noted to have urinary tract infection.  Intubated for airway protection and transferred to [facility]:  Nephrology  Procedures:  Oral endotracheal tube 3/14 Right subclavian triple-lumen catheter 3/14   Significant Diagnostic Tests:   Micro Data:  Urine culture 3/13 enterococcus as well as Klebsiella these were obtained at [facility]  Antimicrobials:  Meropenem 3/14  Interim history/subjective:  Status post transfer to [facility]   Objective   There were no vitals taken for this visit.       No intake or output data in the 24 hours ending 04/07/18 1811 There were no vitals filed for this visit.  Examination: General: This is an obese 48 year old chronically ill-appearing male currently sedated on fentanyl infusion HENT: Orally intubated no JVD mucous membranes moist Lungs: Clear to auscultation Cardiovascular: Regular rate and rhythm Abdomen: Obese, bowel sounds present Extremities: Dry scaly skin, no significant edema Neuro: Awakens to stimulus, moves all extremities GU: Cloudy yellow urine  Resolved Hospital Problem list     Assessment & Plan:  Acute metabolic/hepatic encephalopathy.  He has underlying history of mental delay which is why he resides at skilled nursing facility.  However initial ammonia level was 195, I suspect acute on chronic renal failure and urinary tract infection also playing a role. Plan Continuing supportive care Limit  sedation Resume lactulose and rifaximin Correct metabolic  derangements If no improvement would need to consider seizure on differential diagnosis with history of epilepsy  History of seizure Plan Continue home AEDs  Acute respiratory failure: Secondary to ineffective airway protection Plan Continue full ventilator support Obtain ABG and chest x-ray VAP bundle  Acute on chronic renal failure, has baseline's CKD stage IV his last creatinine was measured at 3.9 from last discharge however he was admitted with a creatinine of 6.3 and it is since climbed to 7.4 Plan Renal ultrasound ABG Stat chemistry Nephrology consult, will likely need CRRT however I do not think he is a long-term dialysis candidate, we will need to discuss this with his power of attorney Strict intake output Renal dose medications  Urinary tract infection, growing Klebsiella as well as enterococcus Plan Cont meropenem as there was concern for ESBL producer  Hypotension; Likely mix of volume depletion and sedation also consider sepsis Plan Limiting sedation Assess bedside echo IVFs Hold antihypertensives  Progressive metabolic acidosis, previously did have anion gap. I do not have his most current lab work present Plan Stat blood chemistry, and lactic acid Continuing bicarbonate drip which was started at outside hospital for now Will need serial chemistries  Intermittent fluid and electrolyte imbalance Plan Follow-up stat chemistry  History of chronic liver disease/presumed cirrhosis Plan Continue to trend LFTs Repeat ammonia level Lactulose and rifaximin as mentioned above   Anemia of chronic disease Appears as though baseline hemoglobin ranges from 7-8 Plan Trend CBC Transfuse for hemoglobin less than 7  Diabetes type 2 with hyperglycemia Plan Initiate hyperglycemia protocol  History of GERD Plan H2 blockade   Best practice:  Diet: NPO Pain/Anxiety/Delirium protocol (if indicated): 3/14 VAP protocol (if indicated): 3/14 DVT prophylaxis:  [place] heparin  GI prophylaxis: H2B Glucose control: ssi Mobility: BR Code Status: full code  Family Communication: pending  Disposition: Critically ill due to acute on chronic renal failure, profound metabolic acidosis, altered sensorium and need for mechanical ventilation.  Will likely need nephrology consult, treating with IV fluids for his repeating all lab work, depending on results will determine next intervention  Labs   CBC: No results for input(s): WBC, NEUTROABS, HGB, HCT, MCV, PLT in the last 168 hours.  Basic Metabolic Panel: No results for input(s): NA, K, CL, CO2, GLUCOSE, BUN, CREATININE, CALCIUM, MG, PHOS in the last 168 hours. GFR: CrCl cannot be calculated (No successful lab value found.). No results for input(s): PROCALCITON, WBC, LATICACIDVEN in the last 168 hours.  Liver Function Tests: No results for input(s): AST, ALT, ALKPHOS, BILITOT, PROT, ALBUMIN in the last 168 hours. No results for input(s): LIPASE, AMYLASE in the last 168 hours. No results for input(s): AMMONIA in the last 168 hours.  ABG No results found for: PHART, PCO2ART, PO2ART, HCO3, TCO2, ACIDBASEDEF, O2SAT   Coagulation Profile: No results for input(s): INR, PROTIME in the last 168 hours.  Cardiac Enzymes: No results for input(s): CKTOTAL, CKMB, CKMBINDEX, TROPONINI in the last 168 hours.  HbA1C: No results found for: HGBA1C  CBG: No results for input(s): GLUCAP in the last 168 hours.  Review of Systems:   Not able   Past Medical History  He,  has no past medical history on file.   Surgical History   Not on file   Social History   has an unknown smoking status. He has never used smokeless tobacco.   Family History   His family history is not on file.   Allergies Not  on File   Home Medications  Prior to Admission medications   Medication Sig Start Date End Date Taking? Authorizing Provider  acetaminophen-codeine (TYLENOL #3) 300-30 MG tablet  12/01/15   [provider]  amLODipine (NORVASC) 10 MG tablet  10/15/15   [provider]  amoxicillin-clavulanate (AUGMENTIN) 875-125 MG tablet Take 1 tablet by mouth 2 (two) times daily. 04/13/16   Landis Martins, DPM  carbamazepine (CARBATROL) 300 MG 12 hr capsule  10/06/15   [provider]  clindamycin (CLEOCIN) 300 MG capsule Take 1 capsule (300 mg total) by mouth 3 (three) times daily. 04/13/16   Landis Martins, DPM  COLCRYS 0.6 MG tablet TK 1 T PO  QD PRN 10/28/15   [provider]  gabapentin (NEURONTIN) 300 MG capsule  10/09/15   [provider]  gabapentin (NEURONTIN) 600 MG tablet  02/06/16   [provider]  HUMULIN R U-500 KWIKPEN 500 UNIT/ML kwikpen  12/31/15   [provider]  LANTUS SOLOSTAR 100 UNIT/ML Solostar Pen  11/18/15   [provider]  metFORMIN (GLUCOPHAGE) 500 MG tablet  01/04/16   [provider]  metoprolol (LOPRESSOR) 50 MG tablet  01/26/16   [provider]  omeprazole (PRILOSEC) 20 MG capsule  11/05/15   [provider]  PHENobarbital (LUMINAL) 97.2 MG tablet TK 2 AND 1/2 TS PO QHS 11/09/15   [provider]  simvastatin (ZOCOR) 40 MG tablet TK 1 T PO  QD 01/20/16   [provider]  ULORIC 40 MG tablet  10/06/15   [provider]  valsartan (DIOVAN) 160 MG tablet  11/02/15   [provider]     Critical care time: 45 minutes    [facility] Colace ACNP-BC [place] Pager # [phone] OR # [phone] if no answer

## 2018-04-07 NOTE — Progress Notes (Signed)
ABG obtained on current settings PRVC 450/16/30%/ +5. ABG called to RN at [facility] 7.10/ 47/119/14.7 97%. Vent changes made per original order PRVC 580/16/30%/+5.

## 2018-04-07 NOTE — Progress Notes (Addendum)
Pharmacy note  48 yo male from transferred from [place] with AMS and hepatic encephalopathy. He is on carbamazepine and phenobarbital at his SNF (history of seizures) and pharmacy asked to transition his medications to a form that can be give via nasogastric tube.  [place] -Carbamazepine: While he was at [place] he was given Keppra IV in place of  carbamazepine as it appears he was not able to take oral medications. The  carbamazepine level was < 3 on 3/13 (goal 4-12). -Phenobarbital: it appears he was ordered IV phenobarbital 70m IV q8h (the timing of last dose is unclear). I did not see a phenobarbital level     Dosing at SNF ([facility] -Carbamazepine 6014mXR bid -Phenobarbital: His MAR was sent and his dosing regimen was cut off and I am unable to tell the dosing.    Plan -Begin tegretol suspension 40063mo bid  -Will need to [facility] regimen [phone] check a phenobarbital level in am  AndHildred LaserharmD Clinical Pharmacist **Pharmacist phone directory can now be found on [email or web address] (PW [email or web address]).  Listed under [facility] [place]

## 2018-04-07 NOTE — Consult Note (Signed)
Referring Provider: No ref. provider found Primary Care Physician:  [facility] Primary Nephrologist:   Reason for Consultation: Acute kidney injury, sepsis, hypotension, hypothermia, and oliguric renal failure.  Assessment, evaluation and treatment of anemia.  Management of electrolyte disorders and acid-base imbalance.  Maintenance of euvolemia.  HPI: This is a 48 year old gentleman who is a resident of a skilled nursing facility who was admitted to [facility] after being evaluated at [facility].  He has a history of diabetes mellitus type 2, chronic liver disease , neuropathy hyperlipidemia and hypertension.  It appears that he has septic shock secondary to a Klebsiella urinary tract infection.  He is developed anuria.  Morbidly obese with markedly dry skin Foley catheter in place with minimal urine output.  Intubated and sedated.  2D echo showed normal LV and RV function IVC not dilated.  Renal ultrasound did not reveal any evidence of hydronephrosis.  Transferred for initiation of dialysis, probable CRRT  Home medications amlodipine 10 mg daily carbamazepine 300 mg twice daily, Colcrys 0.6 mg daily Neurontin 600 mg daily, Humulin insulin, metformin 500 mg daily Lopressor 50 mg omeprazole 20 mg daily simvastatin 40 mg daily Uloric 40 mg a day and Diovan 160 mg daily.  In-hospital medicines meropenem 500 mg every 24 hours, rifaximin 550 mg twice daily, Levophed titrated 5 to 50 mcg, insulin sliding scale, Pepcid 20 mg IV every 12 hours.  Sodium 136 potassium 2.9 chloride 111 CO2 15 glucose 119 BUN 46 creatinine 7.45 calcium 6.3, albumin 2.3 AST 8 ALT 9 alkaline phosphatase 130, total bilirubin 0.6.  WBC 4.6 hemoglobin 6.6 platelets pending.  X-ray showed progressive cardiomegaly with stable changes of congestive heart failure 04/07/2018    Prior to Admission medications   Medication Sig Start Date End Date Taking? Authorizing Provider  acetaminophen-codeine  (TYLENOL #3) 300-30 MG tablet  12/01/15   [provider]  amLODipine (NORVASC) 10 MG tablet  10/15/15   [provider]  amoxicillin-clavulanate (AUGMENTIN) 875-125 MG tablet Take 1 tablet by mouth 2 (two) times daily. 04/13/16   Landis Martins, DPM  carbamazepine (CARBATROL) 300 MG 12 hr capsule  10/06/15   [provider]  clindamycin (CLEOCIN) 300 MG capsule Take 1 capsule (300 mg total) by mouth 3 (three) times daily. 04/13/16   Landis Martins, DPM  COLCRYS 0.6 MG tablet TK 1 T PO  QD PRN 10/28/15   [provider]  gabapentin (NEURONTIN) 300 MG capsule  10/09/15   [provider]  gabapentin (NEURONTIN) 600 MG tablet  02/06/16   [provider]  HUMULIN R U-500 KWIKPEN 500 UNIT/ML kwikpen  12/31/15   [provider]  LANTUS SOLOSTAR 100 UNIT/ML Solostar Pen  11/18/15   [provider]  metFORMIN (GLUCOPHAGE) 500 MG tablet  01/04/16   [provider]  metoprolol (LOPRESSOR) 50 MG tablet  01/26/16   [provider]  omeprazole (PRILOSEC) 20 MG capsule  11/05/15   [provider]  PHENobarbital (LUMINAL) 97.2 MG tablet TK 2 AND 1/2 TS PO QHS 11/09/15   [provider]  simvastatin (ZOCOR) 40 MG tablet TK 1 T PO  QD 01/20/16   [provider]  ULORIC 40 MG tablet  10/06/15   [provider]  valsartan (DIOVAN) 160 MG tablet  11/02/15   [provider]    Current Facility-Administered Medications  Medication Dose Route Frequency Provider Last Rate Last Dose  . 0.9 %  sodium chloride infusion   Intravenous Continuous  [facility] Colace, NP 75 mL/hr at 04/07/18 1939    . 0.9 %  sodium chloride infusion  250 mL Intravenous Continuous [facility] Colace, NP 10 mL/hr at 04/07/18 2007 250 mL at 04/07/18 2007  . chlorhexidine gluconate (MEDLINE KIT) (PERIDEX) 0.12 % solution 15 mL  15 mL Mouth Rinse BID Kipp Brood, MD   15 mL at 04/07/18 2008  . famotidine (PEPCID) IVPB 20 mg  in NS 100 mL IVPB  20 mg Intravenous Q12H [facility] Colace, NP      . fentaNYL (SUBLIMAZE) injection 100 mcg  100 mcg Intravenous Q15 min PRN [facility] Colace, NP      . fentaNYL (SUBLIMAZE) injection 100 mcg  100 mcg Intravenous Q2H PRN [facility] Colace, NP      . heparin injection 5,000 Units  5,000 Units Subcutaneous Q8H [facility] Colace, NP      . insulin aspart (novoLOG) injection 0-20 Units  0-20 Units Subcutaneous Q4H E[facility] Colace, NP      . lactulose (CHRONULAC) 10 GM/15ML solution 30 g  30 g Per Tube TID [facility] Colace, NP      . MEDLINE mouth rinse  15 mL Mouth Rinse 10 times per day Kipp Brood, MD      . Derrill Memo ON 04/08/2018] meropenem (MERREM) 500 mg in sodium chloride 0.9 % 100 mL IVPB  500 mg Intravenous Q24H Laqueta Linden A, RPH      . norepinephrine (LEVOPHED) 24m in 2558mpremix infusion  5-50 mcg/min Intravenous Titrated BaErick ColaceNP 18.75 mL/hr at 04/07/18 2022 5 mcg/min at 04/07/18 2022  . rifaximin (XIFAXAN) tablet 550 mg  550 mg Per Tube BID BaErick ColaceNP        Allergies as of 04/07/2018  . (Not on File)    No family history on file.  Social History   Socioeconomic History  . Marital status: Single    Spouse name: Not on file  . Number of children: Not on file  . Years of education: Not on file  . Highest education level: Not on file  Occupational History  . Not on file  Social Needs  . Financial resource strain: Not on file  . Food insecurity:    Worry: Not on file    Inability: Not on file  . Transportation needs:    Medical: Not on file    Non-medical: Not on file  Tobacco Use  . Smoking status: Unknown If Ever Smoked  . Smokeless tobacco: Never Used  Substance and Sexual Activity  . Alcohol use: Not on file  . Drug use: Not on file  . Sexual activity: Not on file  Lifestyle  . Physical activity:    Days per week: Not on file    Minutes per session: Not on file  . Stress: Not on file  Relationships  . Social  connections:    Talks on phone: Not on file    Gets together: Not on file    Attends religious service: Not on file    Active member of club or organization: Not on file    Attends meetings of clubs or organizations: Not on file    Relationship status: Not on file  . Intimate partner violence:    Fear of current or ex partner: Not on file    Emotionally abused: Not on file    Physically abused: Not on file    Forced sexual activity: Not on file  Other Topics  Concern  . Not on file  Social History Narrative  . Not on file    Review of Systems: Intubated sedated and nonverbal  Physical Exam: Vital signs in last 24 hours: Temp:  [91.1 F (32.8 C)-92.3 F (33.5 C)] 92.3 F (33.5 C) (03/14 2015) Pulse Rate:  [57-59] 58 (03/14 2000) Resp:  [0-20] 0 (03/14 2000) BP: (68-94)/(38-53) 90/46 (03/14 2000) SpO2:  [92 %-97 %] 96 % (03/14 2000)   General: Awakens to voice opens eyes to command. Head:  Normocephalic and atraumatic. Eyes:  Sclera clear, no icterus.   Pale sub-conjunctival membrane Ears:  Normal auditory acuity. Nose:  No deformity, discharge,  or lesions. Mouth:  No deformity or lesions, dentition normal.  ET tube Neck:  Supple; no masses or thyromegaly. JVP not elevated Lungs: Apical breath sounds heard through ventilator Heart:  Regular rate and rhythm; no murmurs, clicks, rubs,  or gallops. Abdomen:  Soft, nontender and nondistended. No masses, hepatosplenomegaly or hernias noted. Normal bowel sounds, without guarding, and without rebound.   Msk:  Symmetrical without gross deformities. Normal posture. Pulses:  No carotid, renal, femoral bruits. DP and PT symmetrical and equal Extremities:  Without clubbing or edema. Skin: Peeling and dry Cervical Nodes:  No significant cervical adenopathy. .  Intake/Output from previous day: No intake/output data recorded. Intake/Output this shift: No intake/output data recorded.  Lab Results: Recent Labs    04/07/18 1948   WBC 4.6  HGB 6.6*  HCT 22.7*  PLT PENDING   BMET No results for input(s): NA, K, CL, CO2, GLUCOSE, BUN, CREATININE, CALCIUM, PHOS in the last 72 hours.  Invalid input(s): MAG LFT No results for input(s): PROT, ALBUMIN, AST, ALT, ALKPHOS, BILITOT, BILIDIR, IBILI in the last 72 hours. PT/INR No results for input(s): LABPROT, INR in the last 72 hours. Hepatitis Panel No results for input(s): HEPBSAG, HCVAB, HEPAIGM, HEPBIGM in the last 72 hours.  Studies/Results: US Renal  Result Date: 04/07/2018 CLINICAL DATA:  Acute renal failure. EXAM: RENAL / URINARY TRACT ULTRASOUND COMPLETE COMPARISON:  Yesterday. FINDINGS: Right Kidney: Renal measurements: 13.2 x 6.8 x 6 0 cm = volume: 281 mL . Echogenicity within normal limits. No mass or hydronephrosis visualized. Left Kidney: Renal measurements: 13.2 x 6 0 x 5.2 cm = volume: 215 mL. Echogenicity within normal limits. No mass or hydronephrosis visualized. Bladder: Not visualized with a Foley catheter in place. IMPRESSION: 1. Normal kidneys without hydronephrosis. 2. Nonvisualized urinary bladder with a Foley catheter in place. Electronically Signed   By: Claudie Revering M.D.   On: 04/07/2018 19:38   Dg Chest Port 1 View  Result Date: 04/07/2018 CLINICAL DATA:  Acute respiratory failure. EXAM: PORTABLE CHEST 1 VIEW COMPARISON:  Earlier today. FINDINGS: Endotracheal tube in satisfactory position. Stable right central venous catheter. Nasogastric tube extending into the stomach. Progressive enlargement of the cardiac silhouette. Diffuse prominence of the pulmonary vasculature and interstitial markings with less bilateral airspace opacity with an improved inspiration. Unremarkable bones. IMPRESSION: Mildly progressive cardiomegaly with grossly stable changes of congestive heart failure. Electronically Signed   By: Claudie Revering M.D.   On: 04/07/2018 19:42    Assessment/Plan:  Acute kidney injury.  We do not have baseline renal function in epic records.   We will have to look at previous labs from patient.  Oligo anuria.  Secondary to sepsis no evidence of any obstruction.  Will send urine for electrolytes and urinalysis for microscopy.  Patient appears to be clinically volume deplete.  Agree with volume resuscitation.  Avoid nephrotoxins IV contrast nonsteroidal anti-inflammatory drugs Cox 2 inhibitors ACE inhibitors and ARB's.  We will continue to follow I's and O's.  Daily renal panel  Metabolic acidosis patient will require bicarb drip.  Will start with D5W with 3 A of bicarb at 125 cc an hour.  Sepsis secondary to Klebsiella UTI currently being treated with meropenem dosed for renal failure.  Hypokalemia we will need to re-replete we will give 4 runs of KCl.  Hypothermia probably secondary to urosepsis.  Anemia will probably need blood products agree with PPI.  Rule out GI bleed.  Stress gastritis.  Avoid heparin products.   LOS: [place] _0 _1 :38 PM

## 2018-04-07 NOTE — Progress Notes (Signed)
CRITICAL VALUE ALERT  Critical Value:  Hemoglobin 6.6  Date & Time Notified:  04/07/2018 @ 8:40PM  Provider Notified: Ellwood Sayers, RN  Orders Received/Actions taken: Awaiting new orders Clint Bolder, RN 04/07/18 8:50 PM

## 2018-04-07 NOTE — Progress Notes (Signed)
CRITICAL VALUE ALERT  Critical Value:  Calcium 6.3  Date & Time Notified:  04/07/2018 @ 08:48PM  Provider Notified: Warren Lacy, RN-[place]  Orders Received/Actions taken: Awaiting new orders.  Clint Bolder, RN 04/07/18 8:49 PM

## 2018-04-07 NOTE — Progress Notes (Signed)
[place] Progress Note Patient Name: Eddie Davies DOB: 15-Mar-1970 MRN: [id number]   Date of Service  04/07/2018  HPI/Events of Note  Notified of H/H 6.6/22.7 with no sign of active bleed, no evidence of hematoma on groin and no abdominal distension.  On low dose pressors.  eICU Interventions  Ordered to transfuse 1 unit PRBC Nephrology ordered for K correction and bicarb     Intervention Category Intermediate Interventions: Bleeding - evaluation and treatment with blood products;Electrolyte abnormality - evaluation and management  Judd Lien 04/07/2018, 9:28 PM

## 2018-04-08 ENCOUNTER — Inpatient Hospital Stay (HOSPITAL_COMMUNITY): Payer: Medicare Other

## 2018-04-08 DIAGNOSIS — R6521 Severe sepsis with septic shock: Secondary | ICD-10-CM

## 2018-04-08 DIAGNOSIS — G9340 Encephalopathy, unspecified: Secondary | ICD-10-CM

## 2018-04-08 DIAGNOSIS — R4182 Altered mental status, unspecified: Secondary | ICD-10-CM

## 2018-04-08 DIAGNOSIS — J9601 Acute respiratory failure with hypoxia: Secondary | ICD-10-CM

## 2018-04-08 DIAGNOSIS — N179 Acute kidney failure, unspecified: Secondary | ICD-10-CM

## 2018-04-08 LAB — URINALYSIS, ROUTINE W REFLEX MICROSCOPIC
Bilirubin Urine: NEGATIVE
Glucose, UA: NEGATIVE mg/dL
Ketones, ur: NEGATIVE mg/dL
Nitrite: NEGATIVE
Protein, ur: 100 mg/dL — AB
Specific Gravity, Urine: 1.018 (ref 1.005–1.030)
pH: 5 (ref 5.0–8.0)

## 2018-04-08 LAB — GLUCOSE, CAPILLARY
GLUCOSE-CAPILLARY: 104 mg/dL — AB (ref 70–99)
Glucose-Capillary: 134 mg/dL — ABNORMAL HIGH (ref 70–99)
Glucose-Capillary: 46 mg/dL — ABNORMAL LOW (ref 70–99)
Glucose-Capillary: 51 mg/dL — ABNORMAL LOW (ref 70–99)
Glucose-Capillary: 69 mg/dL — ABNORMAL LOW (ref 70–99)
Glucose-Capillary: 73 mg/dL (ref 70–99)
Glucose-Capillary: 74 mg/dL (ref 70–99)
Glucose-Capillary: 75 mg/dL (ref 70–99)
Glucose-Capillary: 81 mg/dL (ref 70–99)
Glucose-Capillary: 91 mg/dL (ref 70–99)

## 2018-04-08 LAB — RENAL FUNCTION PANEL
Albumin: 2.3 g/dL — ABNORMAL LOW (ref 3.5–5.0)
Albumin: 2.1 g/dL — ABNORMAL LOW (ref 3.5–5.0)
Anion gap: 14 (ref 5–15)
Anion gap: 11 (ref 5–15)
BUN: 48 mg/dL — ABNORMAL HIGH (ref 6–20)
BUN: 42 mg/dL — ABNORMAL HIGH (ref 6–20)
CO2: 14 mmol/L — ABNORMAL LOW (ref 22–32)
CO2: 18 mmol/L — ABNORMAL LOW (ref 22–32)
Calcium: 6.4 mg/dL — CL (ref 8.9–10.3)
Calcium: 5.9 mg/dL — CL (ref 8.9–10.3)
Chloride: 110 mmol/L (ref 98–111)
Chloride: 109 mmol/L (ref 98–111)
Creatinine, Ser: 7.47 mg/dL — ABNORMAL HIGH (ref 0.61–1.24)
Creatinine, Ser: 6.09 mg/dL — ABNORMAL HIGH (ref 0.61–1.24)
GFR calc Af Amer: 9 mL/min — ABNORMAL LOW (ref >60)
GFR calc Af Amer: 12 mL/min — ABNORMAL LOW (ref >60)
GFR calc non Af Amer: 8 mL/min — ABNORMAL LOW (ref >60)
GFR calc non Af Amer: 10 mL/min — ABNORMAL LOW (ref >60)
Glucose, Bld: 102 mg/dL — ABNORMAL HIGH (ref 70–99)
Glucose, Bld: 86 mg/dL (ref 70–99)
Phosphorus: 11.7 mg/dL — ABNORMAL HIGH (ref 2.5–4.6)
Phosphorus: 9.4 mg/dL — ABNORMAL HIGH (ref 2.5–4.6)
Potassium: 2.9 mmol/L — ABNORMAL LOW (ref 3.5–5.1)
Potassium: 2.8 mmol/L — ABNORMAL LOW (ref 3.5–5.1)
Sodium: 138 mmol/L (ref 135–145)
Sodium: 138 mmol/L (ref 135–145)

## 2018-04-08 LAB — POCT I-STAT 7, (LYTES, BLD GAS, ICA,H+H)
Acid-base deficit: 13 mmol/L — ABNORMAL HIGH (ref 0.0–2.0)
Bicarbonate: 14.4 mmol/L — ABNORMAL LOW (ref 20.0–28.0)
Calcium, Ion: 1.01 mmol/L — ABNORMAL LOW (ref 1.15–1.40)
HCT: 37 % — ABNORMAL LOW (ref 39.0–52.0)
Hemoglobin: 12.6 g/dL — ABNORMAL LOW (ref 13.0–17.0)
O2 Saturation: 96 %
Potassium: 3.4 mmol/L — ABNORMAL LOW (ref 3.5–5.1)
Sodium: 138 mmol/L (ref 135–145)
TCO2: 16 mmol/L — ABNORMAL LOW (ref 22–32)
pCO2 arterial: 39.5 mmHg (ref 32.0–48.0)
pH, Arterial: 7.17 — CL (ref 7.350–7.450)
pO2, Arterial: 107 mmHg (ref 83.0–108.0)

## 2018-04-08 LAB — CBC
HCT: 21.1 % — ABNORMAL LOW (ref 39.0–52.0)
HCT: 23.4 % — ABNORMAL LOW (ref 39.0–52.0)
Hemoglobin: 6.2 g/dL — CL (ref 13.0–17.0)
Hemoglobin: 7 g/dL — ABNORMAL LOW (ref 13.0–17.0)
MCH: 28.2 pg (ref 26.0–34.0)
MCH: 28.7 pg (ref 26.0–34.0)
MCHC: 29.4 g/dL — ABNORMAL LOW (ref 30.0–36.0)
MCHC: 29.9 g/dL — ABNORMAL LOW (ref 30.0–36.0)
MCV: 95.9 fL (ref 80.0–100.0)
MCV: 95.9 fL (ref 80.0–100.0)
Platelets: DECREASED 10*3/uL (ref 150–400)
Platelets: 96 10*3/uL — ABNORMAL LOW (ref 150–400)
RBC: 2.2 MIL/uL — AB (ref 4.22–5.81)
RBC: 2.44 MIL/uL — AB (ref 4.22–5.81)
RDW: 17.5 % — ABNORMAL HIGH (ref 11.5–15.5)
RDW: 17.1 % — ABNORMAL HIGH (ref 11.5–15.5)
WBC: 5.2 10*3/uL (ref 4.0–10.5)
WBC: 6.7 10*3/uL (ref 4.0–10.5)
nRBC: 3.7 % — ABNORMAL HIGH (ref 0.0–0.2)
nRBC: 2.2 % — ABNORMAL HIGH (ref 0.0–0.2)

## 2018-04-08 LAB — HIV ANTIBODY (ROUTINE TESTING W REFLEX): HIV Screen 4th Generation wRfx: NONREACTIVE

## 2018-04-08 LAB — PHENOBARBITAL LEVEL: Phenobarbital: 10.8 ug/mL — ABNORMAL LOW (ref 15.0–30.0)

## 2018-04-08 LAB — MAGNESIUM
Magnesium: 2.3 mg/dL (ref 1.7–2.4)
Magnesium: 2 mg/dL (ref 1.7–2.4)

## 2018-04-08 LAB — AMMONIA: Ammonia: 66 umol/L — ABNORMAL HIGH (ref 9–35)

## 2018-04-08 LAB — PROCALCITONIN: Procalcitonin: 0.26 ng/mL

## 2018-04-08 LAB — HEMOGLOBIN A1C
Hgb A1c MFr Bld: 8.3 % — ABNORMAL HIGH (ref 4.8–5.6)
Mean Plasma Glucose: 191.51 mg/dL

## 2018-04-08 LAB — PREPARE RBC (CROSSMATCH)

## 2018-04-08 MED ORDER — PRO-STAT SUGAR FREE PO LIQD
30.0000 mL | Freq: Two times a day (BID) | ORAL | Status: DC
  Administered 2018-04-08 – 2018-04-09 (×3): 30 mL
  Filled 2018-04-08 (×3): qty 30

## 2018-04-08 MED ORDER — PHENOBARBITAL 97.2 MG PO TABS
226.8000 mg | ORAL_TABLET | Freq: Every day | ORAL | Status: DC
  Administered 2018-04-08 – 2018-04-13 (×6): 226.8 mg via ORAL
  Filled 2018-04-08 (×4): qty 1
  Filled 2018-04-08: qty 2
  Filled 2018-04-08: qty 1
  Filled 2018-04-08: qty 2

## 2018-04-08 MED ORDER — DEXTROSE 50 % IV SOLN
12.5000 g | INTRAVENOUS | Status: AC
  Administered 2018-04-08: 12.5 g via INTRAVENOUS

## 2018-04-08 MED ORDER — PRISMASOL BGK 4/2.5 32-4-2.5 MEQ/L IV SOLN
INTRAVENOUS | Status: DC
  Administered 2018-04-08 – 2018-04-11 (×25): via INTRAVENOUS_CENTRAL
  Filled 2018-04-08 (×41): qty 5000

## 2018-04-08 MED ORDER — STERILE WATER FOR INJECTION IV SOLN
INTRAVENOUS | Status: DC
  Administered 2018-04-08 – 2018-04-09 (×6): via INTRAVENOUS_CENTRAL
  Filled 2018-04-08 (×8): qty 150

## 2018-04-08 MED ORDER — SODIUM CHLORIDE 0.9 % IV SOLN
1.0000 g | Freq: Two times a day (BID) | INTRAVENOUS | Status: DC
  Administered 2018-04-08 – 2018-04-12 (×9): 1 g via INTRAVENOUS
  Filled 2018-04-08 (×9): qty 1

## 2018-04-08 MED ORDER — DEXTROSE 50 % IV SOLN
25.0000 g | INTRAVENOUS | Status: AC
  Administered 2018-04-08: 25 g via INTRAVENOUS

## 2018-04-08 MED ORDER — PHENOBARBITAL 60 MG/ML ORAL SUSPENSION: 226.8000 mg | Freq: Every day | ORAL | Status: DC

## 2018-04-08 MED ORDER — SODIUM CHLORIDE 0.9 % FOR CRRT
INTRAVENOUS_CENTRAL | Status: DC | PRN
  Filled 2018-04-08: qty 1000

## 2018-04-08 MED ORDER — POTASSIUM CHLORIDE 10 MEQ/100ML IV SOLN
10.0000 meq | INTRAVENOUS | Status: AC
  Administered 2018-04-08 (×5): 10 meq via INTRAVENOUS
  Filled 2018-04-08 (×5): qty 100

## 2018-04-08 MED ORDER — DEXTROSE 50 % IV SOLN
INTRAVENOUS | Status: AC
  Filled 2018-04-08: qty 50

## 2018-04-08 MED ORDER — FENTANYL 2500MCG IN NS 250ML (10MCG/ML) PREMIX INFUSION
25.0000 ug/h | INTRAVENOUS | Status: DC
  Administered 2018-04-08: 50 ug/h via INTRAVENOUS
  Administered 2018-04-08: 250 ug/h via INTRAVENOUS
  Administered 2018-04-09: 350 ug/h via INTRAVENOUS
  Administered 2018-04-09: 400 ug/h via INTRAVENOUS
  Administered 2018-04-09: 300 ug/h via INTRAVENOUS
  Administered 2018-04-10 (×2): 400 ug/h via INTRAVENOUS
  Filled 2018-04-08 (×7): qty 250

## 2018-04-08 MED ORDER — HEPARIN SODIUM (PORCINE) 1000 UNIT/ML DIALYSIS
1000.0000 [IU] | INTRAMUSCULAR | Status: DC | PRN
  Administered 2018-04-11: 2800 [IU] via INTRAVENOUS_CENTRAL
  Filled 2018-04-08: qty 3
  Filled 2018-04-08 (×2): qty 6

## 2018-04-08 MED ORDER — MIDAZOLAM HCL 2 MG/2ML IJ SOLN
INTRAMUSCULAR | Status: AC
  Administered 2018-04-08: 2 mg
  Filled 2018-04-08: qty 2

## 2018-04-08 MED ORDER — FENTANYL BOLUS VIA INFUSION
50.0000 ug | INTRAVENOUS | Status: DC | PRN
  Administered 2018-04-08 – 2018-04-10 (×9): 50 ug via INTRAVENOUS
  Filled 2018-04-08: qty 50

## 2018-04-08 MED ORDER — SODIUM BICARBONATE 8.4 % IV SOLN
INTRAVENOUS | Status: DC
  Administered 2018-04-08 – 2018-04-09 (×2): via INTRAVENOUS
  Filled 2018-04-08 (×3): qty 150

## 2018-04-08 MED ORDER — SODIUM CHLORIDE 0.9 % IV SOLN
1.0000 g | Freq: Three times a day (TID) | INTRAVENOUS | Status: DC
  Administered 2018-04-08 – 2018-04-11 (×10): 1 g via INTRAVENOUS
  Filled 2018-04-08 (×2): qty 1000
  Filled 2018-04-08: qty 1
  Filled 2018-04-08 (×8): qty 1000

## 2018-04-08 MED ORDER — MIDAZOLAM HCL 2 MG/2ML IJ SOLN: 2.0000 mg | Freq: Once | INTRAMUSCULAR | Status: AC

## 2018-04-08 MED ORDER — SODIUM CHLORIDE 0.9% IV SOLUTION
Freq: Once | INTRAVENOUS | Status: AC
  Administered 2018-04-08: 03:00:00 via INTRAVENOUS

## 2018-04-08 MED ORDER — FENTANYL CITRATE (PF) 100 MCG/2ML IJ SOLN
50.0000 ug | Freq: Once | INTRAMUSCULAR | Status: AC
  Administered 2018-04-08: 50 ug via INTRAVENOUS

## 2018-04-08 MED ORDER — STERILE WATER FOR INJECTION IV SOLN
INTRAVENOUS | Status: DC
  Administered 2018-04-08 – 2018-04-09 (×2): via INTRAVENOUS_CENTRAL
  Filled 2018-04-08 (×7): qty 150

## 2018-04-08 MED ORDER — VITAL HIGH PROTEIN PO LIQD
1000.0000 mL | ORAL | Status: DC
  Administered 2018-04-08: 1000 mL

## 2018-04-08 MED ORDER — POTASSIUM CHLORIDE 20 MEQ/15ML (10%) PO SOLN
40.0000 meq | Freq: Once | ORAL | Status: AC
  Administered 2018-04-08: 40 meq
  Filled 2018-04-08: qty 30

## 2018-04-08 MED ORDER — CALCIUM GLUCONATE-NACL 1-0.675 GM/50ML-% IV SOLN
1.0000 g | Freq: Once | INTRAVENOUS | Status: AC
  Administered 2018-04-08: 1000 mg via INTRAVENOUS
  Filled 2018-04-08: qty 50

## 2018-04-08 NOTE — Procedures (Addendum)
ELECTROENCEPHALOGRAM REPORT   Patient: Eddie Davies       Room #: 7N42L EEG No. ID: [id number] Age: 48 y.o.        Sex: male Referring Physician: Agarwala Report Date:  04/08/2018        Interpreting Physician: Alexis Goodell  History: ERCIL CASSIS is an 48 y.o. male with history of cognitive delay presenting with altered mental status  Medications:  Omnipen, Tegretol, Pepcid, Insulin, Lactulose, Merrem, Phenobarbital, Xifaxan, Levophed, Primasol, Sodium Bicarbonate, Fentanyl  Conditions of Recording:  This is a 21 channel routine scalp EEG performed with bipolar and monopolar montages arranged in accordance to the international 10/20 system of electrode placement. One channel was dedicated to EKG recording.  The patient is in the intubated and sedated state.  Description:  The background activity is slow and poorly organized.  It consists of low voltage, polymorphic delta activity that is diffusely distributed and continuous throughout the recording.  There is some occasional superimposed beta and theta activity noted as well that this infrequent.   No epileptiform activity is noted.    Hyperventilation and intermittent photic stimulation were not performed.   IMPRESSION: This is an abnormal EEG secondary to general background slowing.  This finding may be seen with a diffuse disturbance that is etiologically nonspecific, but may include a metabolic encephalopathy or medication effect, among other possibilities.  No epileptiform activity was noted.     Alexis Goodell, MD Neurology [phone] 04/08/2018, 11:52 AM

## 2018-04-08 NOTE — Progress Notes (Signed)
NAME:  Eddie Davies, MRN:  [id number], DOB:  10/15/1970, LOS: 1 ADMISSION DATE:  04/07/2018, CONSULTATION DATE:  3/14 REFERRING MD:  Graylon Good (from [facility]), CHIEF COMPLAINT: Altered mental status, and hepatic encephalopathy  Brief History   48 year old male patient who resides at skilled nursing facility due to cognitive delay.  Has underlying chronic liver disease, as well as chronic kidney disease stage IV.  Admitted 3/13 in [place] with altered sensorium and ammonia level of 195 as well as creatinine of 6.3.  Was admitted to the hospitalist service.  Further evaluate diagnostics revealed urinary tract infection with Klebsiella and enterococcus isolated.  He developed progressive neurological decline, worsening blood pressure/hypotension as well as worsening renal failure and metabolic acidosis and because of this he was transferred to [facility] for nephrology consultation and probable CRRT  History of present illness   This is a 48 year old male patient who resides in a skilled nursing facility with a significant history as mentioned below most notably chronic liver disease, end-stage for CKD.  Apparently has lab work checked quite regularly at a skilled nursing facility, and was admitted to [facility] on 3/13 with chief complaint of altered sensorium and newly identified elevated ammonia level at 195.  He was admitted to the hospitalist service on presentation he was awake but interactive, systolic blood pressure 081/44 notable additional labs as follows: Bicarbonate level 11 anion gap 19 creatinine 6.3 glucose 330 hemoglobin 7.3 blood glucose 338 in the emergency room he was treated with lactulose and rifaximin mean he was admitted for further evaluation and therapy.  Over the next 24 hours serum creatinine continued to decline, further evaluation demonstrated worsening blood pressure, urinary tract infection with Klebsiella & Enterococcus isolated: He was transferred to  [facility] for  Past Medical History  Cognitive delay, history of chronic liver disease, CKD stage IV, hypertension, seizure disorder, type 2 diabetes, history of medical noncompliance.  Significant Hospital Events   3/13 admitted to [facility] for acute renal failure, ammonia level of 195, and altered sensorium. 3/14: Renal failure worse, had risen from 6.3 up to 7.4.  Mental status continued to decline, became more hypotensive and was noted to have urinary tract infection.  Intubated for airway protection and transferred to [place]:  Nephrology  Procedures:  Oral endotracheal tube 3/14 Right subclavian triple-lumen catheter 3/14   Significant Diagnostic Tests:   Micro Data:  Urine culture 3/13 enterococcus as well as Klebsiella these were obtained at [facility]  Antimicrobials:  Meropenem 3/14  Interim history/subjective:  No events overnight, no new complaint  Objective   Blood pressure (!) 100/47, pulse 67, temperature 97.9 F (36.6 C), temperature source Oral, resp. rate 14, height 5' 10"  (1.778 m), weight (!) 146.5 kg, SpO2 99 %. CVP:  [18 mmHg-21 mmHg] 21 mmHg  Vent Mode: PRVC FiO2 (%):  [30 %] 30 % Set Rate:  [16 bmp] 16 bmp Vt Set:  [580 mL] 580 mL PEEP:  [5 cmH20] 5 cmH20 Plateau Pressure:  [16 cmH20-17 cmH20] 16 cmH20   Intake/Output Summary (Last 24 hours) at 04/08/2018 0839 Last data filed at 04/08/2018 0630 Gross per 24 hour  Intake 2761.88 ml  Output 50 ml  Net 2711.88 ml   Filed Weights   04/08/18 0345  Weight: (!) 146.5 kg   Examination: General: Chronically ill appearing male, NAD HENT: [place]/AT, PERRL and MMM Lungs: Coarse BS diffusely Cardiovascular: RRR, Nl S1/S2 and -M/R/G Abdomen: Soft, NT, ND and +BS  Extremities: Dry scaly skin, no significant edema Neuro: Sedate, withdraws all ext to command GU: Cloudy yellow urine  Resolved Hospital Problem list     Assessment & Plan:  Acute metabolic/hepatic  encephalopathy.  He has underlying history of mental delay which is why he resides at skilled nursing facility.  However initial ammonia level was 195, I suspect acute on chronic renal failure and urinary tract infection also playing a role. Plan Supportive care Minimize sedation as able Resume lactulose and rifaximin EEG  History of seizure Plan Continue home AEDs  Acute respiratory failure: Secondary to ineffective airway protection Plan Full vent support ABG and CXR in AM Titrate O2 for sat of 88-92% VAP bundle  Acute on chronic renal failure, has baseline's CKD stage IV his last creatinine was measured at 3.9 from last discharge however he was admitted with a creatinine of 6.3 and it is since climbed to 7.4 Plan Renal U/S pending BMET in AM Renal consult appreciated, need to put HD catheter in today Strict intake output Renal dose medications  Urinary tract infection, growing Klebsiella as well as enterococcus Plan Cont meropenem as there was concern for ESBL producer  Hypotension; Likely mix of volume depletion and sedation also consider sepsis Plan PRN sedation Assess bedside echo IVFs Hold antihypertensives Levophed for BP support  Progressive metabolic acidosis, previously did have anion gap. I do not have his most current lab work present Plan Continuing bicarbonate drip which was started at outside hospital for now BMET Replace electrolytes as indicated  Intermittent fluid and electrolyte imbalance Plan Follow-up stat chemistry  History of chronic liver disease/presumed cirrhosis Plan Continue to trend LFTs Repeat ammonia level in AM Lactulose and rifaximin as mentioned above  Anemia of chronic disease Appears as though baseline hemoglobin ranges from 7-8 Plan Trend CBC Transfuse for hemoglobin less than 7  Diabetes type 2 with hyperglycemia Plan Initiate hyperglycemia protocol  History of GERD Plan H2 blockade  Will place HD catheter  and start CRRT per renal recommendations  Best practice:  Diet: NPO Pain/Anxiety/Delirium protocol (if indicated): 3/14 VAP protocol (if indicated): 3/14 DVT prophylaxis: [place] heparin  GI prophylaxis: H2B Glucose control: ssi Mobility: BR Code Status: full code  Family Communication: pending  Disposition: Critically ill due to acute on chronic renal failure, profound metabolic acidosis, altered sensorium and need for mechanical ventilation.  Will likely need nephrology consult, treating with IV fluids for his repeating all lab work, depending on results will determine next intervention  Labs   CBC: Recent Labs  Lab 04/07/18 1948 04/07/18 2034 04/08/18 0207 04/08/18 0408  WBC 4.6  --  5.2  --   NEUTROABS 3.3  --   --   --   HGB 6.6* 7.1* 6.2* 12.6*  HCT 22.7* 21.0* 21.1* 37.0*  MCV 97.0  --  95.9  --   PLT PLATELET CLUMPS NOTED ON SMEAR, COUNT APPEARS DECREASED  --  PLATELET CLUMPS NOTED ON SMEAR, COUNT APPEARS DECREASED  --     Basic Metabolic Panel: Recent Labs  Lab 04/07/18 1948 04/07/18 2034 04/08/18 0207 04/08/18 0408  NA 136 140 138 138  K 2.9* 3.1* 2.9* 3.4*  CL 111  --  110  --   CO2 15*  --  14*  --   GLUCOSE 119*  --  102*  --   BUN 46*  --  48*  --   CREATININE 7.45*  --  7.47*  --   CALCIUM 6.3*  --  6.4*  --  MG 2.3  --  2.3  --   PHOS >30.0*  --  11.7*  --    GFR: Estimated Creatinine Clearance: 17.7 mL/min (A) (by C-G formula based on SCr of 7.47 mg/dL (H)). Recent Labs  Lab 04/07/18 1948 04/07/18 2200 04/08/18 0207  PROCALCITON 0.26  --   --   WBC 4.6  --  5.2  LATICACIDVEN 0.6 0.7  --     Liver Function Tests: Recent Labs  Lab 04/07/18 1948 04/08/18 0207  AST 8*  --   ALT 9  --   ALKPHOS 131*  --   BILITOT 0.6  --   PROT 5.7*  --   ALBUMIN 2.3* 2.3*   Recent Labs  Lab 04/07/18 1948  LIPASE 17   Recent Labs  Lab 04/07/18 1948 04/08/18 0207  AMMONIA 50* 66*    ABG    Component Value Date/Time   PHART 7.170 (LL)  04/08/2018 0408   PCO2ART 39.5 04/08/2018 0408   PO2ART 107.0 04/08/2018 0408   HCO3 14.4 (L) 04/08/2018 0408   TCO2 16 (L) 04/08/2018 0408   ACIDBASEDEF 13.0 (H) 04/08/2018 0408   O2SAT 96.0 04/08/2018 0408     Coagulation Profile: No results for input(s): INR, PROTIME in the last 168 hours.  Cardiac Enzymes: No results for input(s): CKTOTAL, CKMB, CKMBINDEX, TROPONINI in the last 168 hours.  HbA1C: Hgb A1c MFr Bld  Date/Time Value Ref Range Status  04/07/2018 07:48 PM 8.3 (H) 4.8 - 5.6 % Final    Comment:    (NOTE) Pre diabetes:          5.7%-6.4% Diabetes:              >6.4% Glycemic control for   <7.0% adults with diabetes     CBG: Recent Labs  Lab 04/07/18 2019 04/08/18 0035 04/08/18 0329 04/08/18 0731  GLUCAP 109* 134* 73 46*    Review of Systems:   Not able   Past Medical History  He,  has no past medical history on file.   Surgical History   Not on file   Social History   has an unknown smoking status. He has never used smokeless tobacco.   Family History   His family history is not on file.   Allergies Allergies  Allergen Reactions   Fish Oil Swelling and Other (See Comments)          Home Medications  Prior to Admission medications   Medication Sig Start Date End Date Taking? Authorizing Provider  acetaminophen-codeine (TYLENOL #3) 300-30 MG tablet  12/01/15   [provider]  amLODipine (NORVASC) 10 MG tablet  10/15/15   [provider]  amoxicillin-clavulanate (AUGMENTIN) 875-125 MG tablet Take 1 tablet by mouth 2 (two) times daily. 04/13/16   Landis Martins, DPM  carbamazepine (CARBATROL) 300 MG 12 hr capsule  10/06/15   [provider]  clindamycin (CLEOCIN) 300 MG capsule Take 1 capsule (300 mg total) by mouth 3 (three) times daily. 04/13/16   Landis Martins, DPM  COLCRYS 0.6 MG tablet TK 1 T PO  QD PRN 10/28/15   [provider]  gabapentin (NEURONTIN) 300 MG capsule  10/09/15   [provider]  gabapentin (NEURONTIN) 600 MG tablet  02/06/16   [provider]  HUMULIN R U-500 KWIKPEN 500 UNIT/ML kwikpen  12/31/15   [provider]  LANTUS SOLOSTAR 100 UNIT/ML Solostar Pen  11/18/15   [provider]  metFORMIN (GLUCOPHAGE) 500 MG tablet  01/04/16  [provider]  metoprolol (LOPRESSOR) 50 MG tablet  01/26/16   [provider]  omeprazole (PRILOSEC) 20 MG capsule  11/05/15   [provider]  PHENobarbital (LUMINAL) 97.2 MG tablet TK 2 AND 1/2 TS PO QHS 11/09/15   [provider]  simvastatin (ZOCOR) 40 MG tablet TK 1 T PO  QD 01/20/16   [provider]  ULORIC 40 MG tablet  10/06/15   [provider]  valsartan (DIOVAN) 160 MG tablet  11/02/15   [provider]    The patient is critically ill with multiple organ systems failure and requires high complexity decision making for assessment and support, frequent evaluation and titration of therapies, application of advanced monitoring technologies and extensive interpretation of multiple databases.   Critical Care Time devoted to patient care services described in this note is  34  Minutes. This time reflects time of care of this signee Dr Jennet Maduro. This critical care time does not reflect procedure time, or teaching time or supervisory time of PA/NP/Med student/Med Resident etc but could involve care discussion time.  Rush Farmer, M.D. [facility] Pulmonary/Critical Care Medicine. Pager: [phone]. After hours pager: [phone].

## 2018-04-08 NOTE — Progress Notes (Signed)
Called and spoke to MD about need for restraints. Pt given multiple boluses of Fentanyl and dose increased. Despite these interventions pt still reaching for ETT and is at high risk for dislodging ETT or HD catheter. New orders for restraints. Will continue to monitor.

## 2018-04-08 NOTE — Progress Notes (Signed)
Hypoglycemic Event  CBG: 51 Treatment: D50 50 mL (25 gm)  Symptoms: none Follow-up CBG: Time:1550 CBG Result:see result Possible Reasons for Event: Inadequate meal intake  Comments/MD notified : Dr. Mamie Nick, Ronette Deter

## 2018-04-08 NOTE — Progress Notes (Signed)
EEG completed; results pending.    

## 2018-04-08 NOTE — Progress Notes (Signed)
Pharmacy note  48 yo male from transferred from [place] with AMS and hepatic encephalopathy. He is on carbamazepine and phenobarbital at his SNF (history of seizures) and pharmacy asked to transition his medications to a form that can be give via nasogastric tube.  [place] -Carbamazepine: While he was at [place] he was given Keppra IV in place of  carbamazepine as it appears he was not able to take oral medications. The  carbamazepine level was < 3 on 3/13 (goal 4-12). -Phenobarbital: it appears he was ordered IV phenobarbital 8m IV q8h (the timing of last dose is unclear). I did not see a phenobarbital level     Dosing at SNF ([facility] -Carbamazepine 6062mXR bid -Phenobarbital: 64.8 mg tabs - 3.5 tabs po qhs (226.8 mg) - Gabapentin: 200 mg po tid  AM phenobarb level: 10.8   Plan -Continue tegretol suspension 40026mo bid  - Phenobarbital suspension 226.8 mg per tube qhs - Gabapentin 100 mg per tube tid (adjusted for renal dysfunction)  CatAlanda SlimharmD, FCCPaso Del Norte Surgery Centerinical Pharmacist Please see AMION for all Pharmacists' Contact Phone Numbers 04/08/2018, 9:06 AM

## 2018-04-08 NOTE — Procedures (Signed)
Central Venous Catheter Insertion Procedure Note Eddie Davies [id number] [birth year]  Procedure: Insertion of Central Venous Catheter Indications: Dialysis access  Procedure Details Consent: Risks of procedure as well as the alternatives and risks of each were explained to the (patient/caregiver).  Consent for procedure obtained. Time Out: Verified patient identification, verified procedure, site/side was marked, verified correct patient position, special equipment/implants available, medications/allergies/relevent history reviewed, required imaging and test results available.  Performed  Maximum sterile technique was used including antiseptics, cap, gloves, gown, hand hygiene, mask and sheet. Skin prep: Chlorhexidine; local anesthetic administered A antimicrobial bonded/coated triple lumen catheter was placed in the left internal jugular vein using the Seldinger technique.  Evaluation Blood flow good Complications: No apparent complications Patient did tolerate procedure well. Chest X-ray ordered to verify placement.  CXR: pending.  U/S used in placement  Eddie Davies 04/08/2018, 12:16 PM

## 2018-04-08 NOTE — Progress Notes (Signed)
CRITICAL VALUE ALERT  Critical Value:  K+2.9, Ca-6.4, hgb-6.2, Cr-7.47  Date & Time Notied:  04/08/2018 @ 02:45AM  Provider Notified: Ellwood Sayers, RN   Orders Received/Actions taken: Awaiting new orders  Clint Bolder, RN 04/08/18 2:48 AM

## 2018-04-08 NOTE — Progress Notes (Addendum)
[facility] ROUNDING NOTE   Subjective:   Brief history this is a 48 year old gentleman resident of skilled nursing facility brought to [facility] after being evaluated at [facility].  He is type 2 diabetes chronic liver disease neuropathy hyperlipidemia hypertension.  His home medications included amlodipine 10 mg daily metformin 500 mg daily Diovan 160 mg daily.  He appeared to be in septic shock at [facility] with a Klebsiella urinary tract infection.  He is oliguric with minimal urine output.  Intubated sedated not following commands.  His renal ultrasound did not reveal any evidence of hydronephrosis  Meropenem 500 mg every 24 hours, rifaximin 550 mg twice daily, Levophed titrated to maintain map, insulin sliding scale, lactulose 30 g 3 times daily, Pepcid 20 mg IV every 12 hours, Tegretol 400 mg 3 times daily IV sodium bicarbonate 125 cc an hour  Output 50 cc of urine.  Status post blood transfusion 04/08/2018.  Input 2 L 04/08/1998 698 cc 04/07/2018  Sodium 138 potassium 2.9 chloride 110 CO2 14 glucose 102 BUN 48 creatinine 7.47 calcium 6.4 phosphorus 11.7 magnesium 2.3 albumin 2.3 WBC 5.2 hemoglobin 6.2 platelets clumping.  Phenobarb level 10.8.  Lactic acid 0.7.  Cortisol 13.4.  Procalcitonin 0.26.  Lactic acid 0.6  Chest x-ray progressive cardiomegaly diffuse prominence of pulmonary vasculature interstitial markings.     Objective:  Vital signs in last 24 hours:  Temp:  [91.1 F (32.8 C)-98.2 F (36.8 C)] 97.9 F (36.6 C) (03/15 0630) Pulse Rate:  [57-77] 71 (03/15 0700) Resp:  [0-22] 16 (03/15 0700) BP: (68-131)/(38-101) 104/55 (03/15 0700) SpO2:  [91 %-100 %] 98 % (03/15 0700) FiO2 (%):  [30 %] 30 % (03/15 0630) Weight:  [146.5 kg] 146.5 kg (03/15 0345)  Weight change:  Filed Weights   04/08/18 0345  Weight: (!) 146.5 kg    Intake/Output: I/O last 3 completed shifts: In: 2761.9 [I.V.:1963.7; Blood:315; IV Piggyback:483.2] Out: 50  [Urine:50]   Intake/Output this shift:  No intake/output data recorded. Sedated and intubated CVS-regular rate and rhythm no murmurs rubs gallops RS-clear to auscultation no wheezes or rales intubated with mechanical breath sounds ABD-obese nontender BS present soft non-distended EXT- no edema   Basic Metabolic Panel: Recent Labs  Lab 04/07/18 1948 04/07/18 2034 04/08/18 0207 04/08/18 0408  NA 136 140 138 138  K 2.9* 3.1* 2.9* 3.4*  CL 111  --  110  --   CO2 15*  --  14*  --   GLUCOSE 119*  --  102*  --   BUN 46*  --  48*  --   CREATININE 7.45*  --  7.47*  --   CALCIUM 6.3*  --  6.4*  --   MG 2.3  --  2.3  --   PHOS >30.0*  --  11.7*  --     Liver Function Tests: Recent Labs  Lab 04/07/18 1948 04/08/18 0207  AST 8*  --   ALT 9  --   ALKPHOS 131*  --   BILITOT 0.6  --   PROT 5.7*  --   ALBUMIN 2.3* 2.3*   Recent Labs  Lab 04/07/18 1948  LIPASE 17   Recent Labs  Lab 04/07/18 1948 04/08/18 0207  AMMONIA 50* 66*    CBC: Recent Labs  Lab 04/07/18 1948 04/07/18 2034 04/08/18 0207 04/08/18 0408  WBC 4.6  --  5.2  --   NEUTROABS 3.3  --   --   --   HGB 6.6*  7.1* 6.2* 12.6*  HCT 22.7* 21.0* 21.1* 37.0*  MCV 97.0  --  95.9  --   PLT PLATELET CLUMPS NOTED ON SMEAR, COUNT APPEARS DECREASED  --  PLATELET CLUMPS NOTED ON SMEAR, COUNT APPEARS DECREASED  --     Cardiac Enzymes: No results for input(s): CKTOTAL, CKMB, CKMBINDEX, TROPONINI in the last 168 hours.  BNP: Invalid input(s): POCBNP  CBG: Recent Labs  Lab 04/07/18 2019 04/08/18 0035 04/08/18 0329 04/08/18 0731  GLUCAP 109* 134* 56 46*    Microbiology: Results for orders placed or performed during the hospital encounter of 04/07/18  MRSA PCR Screening     Status: None   Collection Time: 04/07/18  6:22 PM  Result Value Ref Range Status   MRSA by PCR NEGATIVE NEGATIVE Final    Comment:        The GeneXpert MRSA Assay (FDA approved for NASAL specimens only), is one component of  a comprehensive MRSA colonization surveillance program. It is not intended to diagnose MRSA infection nor to guide or monitor treatment for MRSA infections. Performed at [facility], [place] [address] [place], [place] [address]     Coagulation Studies: No results for input(s): LABPROT, INR in the last 72 hours.  Urinalysis: No results for input(s): COLORURINE, LABSPEC, PHURINE, GLUCOSEU, HGBUR, BILIRUBINUR, KETONESUR, PROTEINUR, UROBILINOGEN, NITRITE, LEUKOCYTESUR in the last 72 hours.  Invalid input(s): APPERANCEUR    Imaging: US Renal  Result Date: 04/07/2018 CLINICAL DATA:  Acute renal failure. EXAM: RENAL / URINARY TRACT ULTRASOUND COMPLETE COMPARISON:  Yesterday. FINDINGS: Right Kidney: Renal measurements: 13.2 x 6.8 x 6 0 cm = volume: 281 mL . Echogenicity within normal limits. No mass or hydronephrosis visualized. Left Kidney: Renal measurements: 13.2 x 6 0 x 5.2 cm = volume: 215 mL. Echogenicity within normal limits. No mass or hydronephrosis visualized. Bladder: Not visualized with a Foley catheter in place. IMPRESSION: 1. Normal kidneys without hydronephrosis. 2. Nonvisualized urinary bladder with a Foley catheter in place. Electronically Signed   By: Claudie Revering M.D.   On: 04/07/2018 19:38   Dg Chest Port 1 View  Result Date: 04/08/2018 CLINICAL DATA:  Respiratory failure EXAM: PORTABLE CHEST 1 VIEW COMPARISON:  04/07/2018 FINDINGS: Support Apparatus: --Endotracheal tube: Tip at the level of the clavicular heads. --Enteric tube:Tip and sideport are below the field of view. --Catheter(s):Right subclavian vein approach central venous catheter tip is at the lower SVC --Other: None Unchanged cardiomegaly and mild pulmonary edema. Failing pleural effusions are also unchanged. IMPRESSION: Unchanged appearance of the chest with findings of congestive heart failure. Unchanged support apparatus. Electronically Signed   By: Ulyses Jarred M.D.   On: 04/08/2018 05:17   Dg Chest Port  1 View  Result Date: 04/07/2018 CLINICAL DATA:  Acute respiratory failure. EXAM: PORTABLE CHEST 1 VIEW COMPARISON:  Earlier today. FINDINGS: Endotracheal tube in satisfactory position. Stable right central venous catheter. Nasogastric tube extending into the stomach. Progressive enlargement of the cardiac silhouette. Diffuse prominence of the pulmonary vasculature and interstitial markings with less bilateral airspace opacity with an improved inspiration. Unremarkable bones. IMPRESSION: Mildly progressive cardiomegaly with grossly stable changes of congestive heart failure. Electronically Signed   By: Claudie Revering M.D.   On: 04/07/2018 19:42     Medications:   . sodium chloride 75 mL/hr at 04/08/18 0600  . sodium chloride 10 mL/hr at 04/08/18 0600  . meropenem (MERREM) IV    . norepinephrine (LEVOPHED) Adult infusion 3 mcg/min (04/08/18 0600)  .  sodium bicarbonate (isotonic)  infusion in sterile water 125 mL/hr at 04/08/18 0659   . sodium chloride   Intravenous Once  . carBAMazepine  400 mg Oral TID  . chlorhexidine gluconate (MEDLINE KIT)  15 mL Mouth Rinse BID  . dextrose      . famotidine (PEPCID) IV  20 mg Intravenous Q12H  . heparin  5,000 Units Subcutaneous Q8H  . insulin aspart  0-20 Units Subcutaneous Q4H  . lactulose  30 g Per Tube TID  . mouth rinse  15 mL Mouth Rinse 10 times per day  . rifaximin  550 mg Per Tube BID   fentaNYL (SUBLIMAZE) injection, fentaNYL (SUBLIMAZE) injection  Assessment/ Plan:   Acute kidney injury.  No baseline renal function in epic records.  Anuric renal failure probably secondary to sepsis no evidence of any obstruction.  Patient was taking angiotensin receptor as an outpatient.  Continue to avoid any nephrotoxins.  It looks like we will need to initiate dialysis today.  Urine output still poor despite fluid hydration.  Metabolic acidosis will start patient on CRRT  Sepsis secondary to Klebsiella UTI treated with meropenem  Hypokalemia we will  recheck potassium and dialyze on 4/2.5 potassium bath  Hypothermia probably secondary to urosepsis  Anemia status post transfusion.  Continue PPI and avoid heparin products  Possible history of seizure disorder patient continues to be on carbamazepine 400 mg 3 times daily    LOS: [place] _0 _1 :30 AM

## 2018-04-08 NOTE — Progress Notes (Signed)
Hypoglycemic Event  CBG: 46  Treatment:25g dextrose  Symptoms: increase lethargy Follow-up CBG: Time:0820 CBG Result:81 Possible Reasons for Event: NPO Comments/MD notified: Dr. Mamie Nick, Ronette Deter

## 2018-04-08 NOTE — Progress Notes (Signed)
[place] Progress Note Patient Name: Eddie Davies DOB: Jul 19, 1970 MRN: [id number]   Date of Service  04/08/2018  HPI/Events of Note  Patient still attempts to hold on to tube despite sedation  eICU Interventions  Bilateral soft wrist restraints ordered     Intervention Category Minor Interventions: Other:  Judd Lien 04/08/2018, 11:37 PM

## 2018-04-08 NOTE — Progress Notes (Signed)
[place] Progress Note Patient Name: Eddie Davies DOB: September 12, 1970 MRN: [id number]   Date of Service  04/08/2018  HPI/Events of Note  Metabolic acidosis on ABG, K 2.9 and Hgb 6.2  eICU Interventions  Ongoing blood transfusion Ordered K 40 meqs per feeding tube May need dialysis in am     Intervention Category Major Interventions: Acid-Base disturbance - evaluation and management;Electrolyte abnormality - evaluation and management  Judd Lien 04/08/2018, 4:39 AM

## 2018-04-08 NOTE — Progress Notes (Signed)
Paged and spoke to Renal MD about 5pm renal panel. Awaiting new orders.

## 2018-04-09 ENCOUNTER — Inpatient Hospital Stay (HOSPITAL_COMMUNITY): Payer: Medicare Other

## 2018-04-09 DIAGNOSIS — J9600 Acute respiratory failure, unspecified whether with hypoxia or hypercapnia: Secondary | ICD-10-CM

## 2018-04-09 LAB — POCT I-STAT 7, (LYTES, BLD GAS, ICA,H+H)
ACID-BASE DEFICIT: 10 mmol/L — AB (ref 0.0–2.0)
Acid-Base Excess: 1 mmol/L (ref 0.0–2.0)
Bicarbonate: 17.9 mmol/L — ABNORMAL LOW (ref 20.0–28.0)
Bicarbonate: 27.3 mmol/L (ref 20.0–28.0)
CALCIUM ION: 0.94 mmol/L — AB (ref 1.15–1.40)
Calcium, Ion: 0.94 mmol/L — ABNORMAL LOW (ref 1.15–1.40)
HCT: 25 % — ABNORMAL LOW (ref 39.0–52.0)
HCT: 25 % — ABNORMAL LOW (ref 39.0–52.0)
HEMOGLOBIN: 8.5 g/dL — AB (ref 13.0–17.0)
Hemoglobin: 8.5 g/dL — ABNORMAL LOW (ref 13.0–17.0)
O2 Saturation: 95 %
O2 Saturation: 100 %
Patient temperature: 97.1
Patient temperature: 98.2
Potassium: 3 mmol/L — ABNORMAL LOW (ref 3.5–5.1)
Potassium: 3 mmol/L — ABNORMAL LOW (ref 3.5–5.1)
Sodium: 141 mmol/L (ref 135–145)
Sodium: 139 mmol/L (ref 135–145)
TCO2: 19 mmol/L — ABNORMAL LOW (ref 22–32)
TCO2: 29 mmol/L (ref 22–32)
pCO2 arterial: 45.2 mmHg (ref 32.0–48.0)
pCO2 arterial: 50.7 mmHg — ABNORMAL HIGH (ref 32.0–48.0)
pH, Arterial: 7.201 — ABNORMAL LOW (ref 7.350–7.450)
pH, Arterial: 7.339 — ABNORMAL LOW (ref 7.350–7.450)
pO2, Arterial: 91 mmHg (ref 83.0–108.0)
pO2, Arterial: 231 mmHg — ABNORMAL HIGH (ref 83.0–108.0)

## 2018-04-09 LAB — RENAL FUNCTION PANEL
ALBUMIN: 2.2 g/dL — AB (ref 3.5–5.0)
Albumin: 2.2 g/dL — ABNORMAL LOW (ref 3.5–5.0)
Anion gap: 9 (ref 5–15)
Anion gap: 11 (ref 5–15)
BUN: 34 mg/dL — ABNORMAL HIGH (ref 6–20)
BUN: 26 mg/dL — ABNORMAL HIGH (ref 6–20)
CO2: 24 mmol/L (ref 22–32)
CO2: 25 mmol/L (ref 22–32)
Calcium: 6.2 mg/dL — CL (ref 8.9–10.3)
Calcium: 6.7 mg/dL — ABNORMAL LOW (ref 8.9–10.3)
Chloride: 105 mmol/L (ref 98–111)
Chloride: 103 mmol/L (ref 98–111)
Creatinine, Ser: 4.91 mg/dL — ABNORMAL HIGH (ref 0.61–1.24)
Creatinine, Ser: 3.51 mg/dL — ABNORMAL HIGH (ref 0.61–1.24)
GFR calc Af Amer: 23 mL/min — ABNORMAL LOW (ref >60)
GFR calc non Af Amer: 13 mL/min — ABNORMAL LOW (ref >60)
GFR calc non Af Amer: 20 mL/min — ABNORMAL LOW (ref >60)
GFR, EST AFRICAN AMERICAN: 15 mL/min — AB (ref >60)
Glucose, Bld: 147 mg/dL — ABNORMAL HIGH (ref 70–99)
Glucose, Bld: 122 mg/dL — ABNORMAL HIGH (ref 70–99)
PHOSPHORUS: 6.6 mg/dL — AB (ref 2.5–4.6)
Phosphorus: 4.9 mg/dL — ABNORMAL HIGH (ref 2.5–4.6)
Potassium: 3 mmol/L — ABNORMAL LOW (ref 3.5–5.1)
Potassium: 3.3 mmol/L — ABNORMAL LOW (ref 3.5–5.1)
SODIUM: 139 mmol/L (ref 135–145)
Sodium: 138 mmol/L (ref 135–145)

## 2018-04-09 LAB — PHOSPHORUS: Phosphorus: 4.7 mg/dL — ABNORMAL HIGH (ref 2.5–4.6)

## 2018-04-09 LAB — CBC
HCT: 23.4 % — ABNORMAL LOW (ref 39.0–52.0)
Hemoglobin: 7.3 g/dL — ABNORMAL LOW (ref 13.0–17.0)
MCH: 29.1 pg (ref 26.0–34.0)
MCHC: 31.2 g/dL (ref 30.0–36.0)
MCV: 93.2 fL (ref 80.0–100.0)
Platelets: 94 10*3/uL — ABNORMAL LOW (ref 150–400)
RBC: 2.51 MIL/uL — AB (ref 4.22–5.81)
RDW: 17.8 % — ABNORMAL HIGH (ref 11.5–15.5)
WBC: 7.2 10*3/uL (ref 4.0–10.5)
nRBC: 1.3 % — ABNORMAL HIGH (ref 0.0–0.2)

## 2018-04-09 LAB — TYPE AND SCREEN
ABO/RH(D): A POS
Antibody Screen: NEGATIVE
Unit division: 0

## 2018-04-09 LAB — BPAM RBC
Blood Product Expiration Date: 202004062359
ISSUE DATE / TIME: 202003150321
Unit Type and Rh: 6200

## 2018-04-09 LAB — GLUCOSE, CAPILLARY
Glucose-Capillary: 135 mg/dL — ABNORMAL HIGH (ref 70–99)
Glucose-Capillary: 170 mg/dL — ABNORMAL HIGH (ref 70–99)
Glucose-Capillary: 128 mg/dL — ABNORMAL HIGH (ref 70–99)
Glucose-Capillary: 85 mg/dL (ref 70–99)
Glucose-Capillary: 151 mg/dL — ABNORMAL HIGH (ref 70–99)
Glucose-Capillary: 191 mg/dL — ABNORMAL HIGH (ref 70–99)

## 2018-04-09 LAB — MAGNESIUM
Magnesium: 2 mg/dL (ref 1.7–2.4)
Magnesium: 2.2 mg/dL (ref 1.7–2.4)

## 2018-04-09 LAB — URINE CULTURE: Culture: 70000 — AB

## 2018-04-09 LAB — PROTIME-INR
INR: 1.3 — ABNORMAL HIGH (ref 0.8–1.2)
Prothrombin Time: 15.6 seconds — ABNORMAL HIGH (ref 11.4–15.2)

## 2018-04-09 MED ORDER — PRISMASOL BGK 4/2.5 32-4-2.5 MEQ/L REPLACEMENT SOLN
Status: DC
  Administered 2018-04-09 – 2018-04-10 (×3): via INTRAVENOUS_CENTRAL
  Filled 2018-04-09 (×4): qty 5000

## 2018-04-09 MED ORDER — GABAPENTIN 250 MG/5ML PO SOLN
200.0000 mg | Freq: Three times a day (TID) | ORAL | Status: DC
  Administered 2018-04-09: 200 mg
  Filled 2018-04-09 (×2): qty 4

## 2018-04-09 MED ORDER — LACTULOSE 10 GM/15ML PO SOLN
20.0000 g | Freq: Three times a day (TID) | ORAL | Status: DC
  Administered 2018-04-09 – 2018-04-10 (×4): 20 g
  Filled 2018-04-09 (×6): qty 30

## 2018-04-09 MED ORDER — FAMOTIDINE 40 MG/5ML PO SUSR
20.0000 mg | Freq: Two times a day (BID) | ORAL | Status: DC
  Administered 2018-04-09: 20 mg
  Filled 2018-04-09 (×2): qty 2.5

## 2018-04-09 MED ORDER — MIDAZOLAM HCL 2 MG/2ML IJ SOLN
2.0000 mg | Freq: Once | INTRAMUSCULAR | Status: AC
  Administered 2018-04-09: 2 mg via INTRAVENOUS

## 2018-04-09 MED ORDER — "THROMBI-PAD 3""X3"" EX PADS"
1.0000 | MEDICATED_PAD | Freq: Once | CUTANEOUS | Status: AC
  Administered 2018-04-09: 1 via TOPICAL
  Filled 2018-04-09: qty 1

## 2018-04-09 MED ORDER — MIDAZOLAM HCL 2 MG/2ML IJ SOLN
INTRAMUSCULAR | Status: AC
  Administered 2018-04-09: 2 mg
  Filled 2018-04-09: qty 2

## 2018-04-09 MED ORDER — MIDAZOLAM HCL 2 MG/2ML IJ SOLN
1.0000 mg | INTRAMUSCULAR | Status: DC | PRN
  Administered 2018-04-09 – 2018-04-10 (×6): 2 mg via INTRAVENOUS
  Filled 2018-04-09 (×6): qty 2

## 2018-04-09 MED ORDER — DEXMEDETOMIDINE HCL IN NACL 400 MCG/100ML IV SOLN
0.4000 ug/kg/h | INTRAVENOUS | Status: DC
  Administered 2018-04-09 – 2018-04-10 (×3): 0.4 ug/kg/h via INTRAVENOUS
  Administered 2018-04-10: 0.1 ug/kg/h via INTRAVENOUS
  Administered 2018-04-10 – 2018-04-11 (×2): 0.4 ug/kg/h via INTRAVENOUS
  Filled 2018-04-09 (×6): qty 100

## 2018-04-09 MED ORDER — DARBEPOETIN ALFA 150 MCG/0.3ML IJ SOSY
150.0000 ug | PREFILLED_SYRINGE | INTRAMUSCULAR | Status: DC
  Administered 2018-04-09: 150 ug via SUBCUTANEOUS
  Filled 2018-04-09 (×2): qty 0.3

## 2018-04-09 MED ORDER — VITAL 1.5 CAL PO LIQD
1000.0000 mL | ORAL | Status: DC
  Administered 2018-04-09: 1000 mL
  Filled 2018-04-09 (×2): qty 1000

## 2018-04-09 MED ORDER — POTASSIUM CHLORIDE 10 MEQ/100ML IV SOLN
10.0000 meq | INTRAVENOUS | Status: AC
  Administered 2018-04-09 (×4): 10 meq via INTRAVENOUS
  Filled 2018-04-09 (×4): qty 100

## 2018-04-09 NOTE — Progress Notes (Signed)
Initial Nutrition Assessment  DOCUMENTATION CODES:   Morbid obesity  INTERVENTION:   Modify TF regimen as follows:  Vital 1.5 at 50 mL/hr (goal) + ProStat BID via OG tube. - At goal, provides 1200 mL formula, 2000 kcal, 111 g protein, and 912 mL free water. This meets 100% of estimated energy and protein needs.  NUTRITION DIAGNOSIS:   Inadequate oral intake related to inability to eat as evidenced by NPO status.  GOAL:   Provide needs based on ASPEN/SCCM guidelines  MONITOR:   Diet advancement, Vent status, TF tolerance  REASON FOR ASSESSMENT:   Consult, Ventilator Assessment of nutrition requirement/status, Enteral/tube feeding initiation and management  ASSESSMENT:   48 yo male admitted with acute renal failure 2/2 sepsis. PMH significant for cognitive delay, DM type 2, chronic liver disease, CKD stage 4, HTN, seizure disorder, morbid obesity. Resides in SNF.   Patient is currently intubated on ventilator support MV: 9.2 L/min Temp (24hrs), Avg:96.3 F (35.7 C), Min:94.1 F (34.5 C), Max:98.1 F (36.7 C)  Norepinephrine: 11.3  +OG tube, Vital High Protein @ 40 mL/hr + ProStat BID initiated 3/15  Labs reviewed: glucose 147 mg/dL - on insulin, potassium 3.0 (L), phosphorus 6.6 (H) Medications reviewed and include: Pepcid, ProStat 2x daily, Vital High Protein, heparin, novolog, lactulose, precedex, NS at 10 mL/hr  Pt on CRRT, intubated, and sedated. TF running at 40 mL/hr. Per RN, pt tolerating TF well.  NUTRITION - FOCUSED PHYSICAL EXAM:   Most Recent Value  Orbital Region  No depletion  Upper Arm Region  No depletion  Thoracic and Lumbar Region  Unable to assess  Buccal Region  Unable to assess  Temple Region  No depletion  Clavicle Bone Region  No depletion  Clavicle and Acromion Bone Region  No depletion  Scapular Bone Region  Unable to assess  Dorsal Hand  Unable to assess  Patellar Region  No depletion  Anterior Thigh Region  No depletion  Posterior  Calf Region  No depletion  Edema (RD Assessment)  Moderate  Hair  Reviewed  Eyes  Unable to assess  Mouth  Unable to assess  Skin  Other (Comment) [scaly, brown patches]  Nails  Unable to assess     Diet Order:  Noted fish oil allergy Diet Order            Diet NPO time specified  Diet effective now             EDUCATION NEEDS:  Not appropriate for education at this time  Skin:  Skin Assessment: Skin Integrity Issues: Skin Integrity Issues:: Other (Comment) Other: Nonpressure wound - coccyx; MASD - medial sacrum  Last BM:  3/16, type 7  Height:  Ht Readings from Last 1 Encounters:  04/07/18 5' 10"  (1.778 m)    Weight:  Wt Readings from Last 10 Encounters:  04/09/18 (!) 146.8 kg  Limited wt hx  Ideal Body Weight:  75.5 kg  BMI:  Body mass index is 46.44 kg/m. obese class 3  Estimated Nutritional Needs:   Kcal:  5643-3295 (11-14 kcal/kg IBW)  Protein:  100-130 gm (2-2.5 g/kg IBW)  Fluid:  >/= 1.6 L daily or per MD  Althea Grimmer, MS, RDN, LDN Pager: [phone] Available Mondays and Fridays, 9am-2pm

## 2018-04-09 NOTE — Progress Notes (Signed)
NAME:  Eddie Davies, MRN:  [id number], DOB:  1970/09/29, LOS: 2 ADMISSION DATE:  04/07/2018, CONSULTATION DATE:  3/14 REFERRING MD:  Graylon Good (from [facility]), CHIEF COMPLAINT: Altered mental status, and hepatic encephalopathy  Brief History   48 year old male patient who resides at skilled nursing facility due to cognitive delay.  Has underlying chronic liver disease, as well as chronic kidney disease stage IV.  Admitted 3/13 in [place] with altered sensorium and ammonia level of 195 as well as creatinine of 6.3.  Was admitted to the hospitalist service.  Further evaluate diagnostics revealed urinary tract infection with Klebsiella and enterococcus isolated.  He developed progressive neurological decline, worsening blood pressure/hypotension as well as worsening renal failure and metabolic acidosis and because of this he was transferred to [facility] for nephrology consultation and probable CRRT   Past Medical History  Cognitive delay, history of chronic liver disease, CKD stage IV, hypertension, seizure disorder, type 2 diabetes, history of medical noncompliance.  Significant Hospital Events   3/13 admitted to [facility] for acute renal failure, ammonia level of 195, and altered sensorium. 3/14: Renal failure worse, had risen from 6.3 up to 7.4.  Mental status continued to decline, became more hypotensive and was noted to have urinary tract infection.  Intubated for airway protection and transferred to [facility] 3/15 CRRT started  Consults:  Nephrology  Procedures:  ETT 3/14 >> Right subclavian triple-lumen catheter 3/14 >> [facility] HD cath 3/15 >>   Significant Diagnostic Tests:  EEG 3/15 neg Micro Data:  Urine culture 3/13 enterococcus as well as Klebsiella these were obtained at [facility]  Antimicrobials:  Meropenem 3/14 ampi 3/15 >>  Interim history/subjective:   Hypothermic on CRRT Critically ill, intubated, sedated on high-dose Versed Left  IJ HD catheter is bleeding Nurse reports intermittent agitation  Objective   Blood pressure (!) 110/52, pulse 75, temperature (!) 97.2 F (36.2 C), temperature source Oral, resp. rate 16, height 5' 10"  (1.778 m), weight (!) 146.8 kg, SpO2 92 %.    Vent Mode: PRVC FiO2 (%):  [30 %] 30 % Set Rate:  [16 bmp] 16 bmp Vt Set:  [580 mL] 580 mL PEEP:  [5 cmH20] 5 cmH20 Plateau Pressure:  [15 PZW25-85 cmH20] 24 cmH20   Intake/Output Summary (Last 24 hours) at 04/09/2018 0829 Last data filed at 04/09/2018 0800 Gross per 24 hour  Intake 6739.28 ml  Output 5325 ml  Net 1414.28 ml   Filed Weights   04/08/18 0345 04/09/18 0336  Weight: (!) 146.5 kg (!) 146.8 kg   Examination: General: Chronically ill appearing male, sedated on fentanyl drip Mild pallor, no icterus, obese, short neck, bleeding from around left IJ HD catheter packed Flexi-Seal with liquid stool Some urine in Foley Decreased breath sounds bilateral Soft nontender obese abdomen S1-S2 distant 2+ edema 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  He has underlying history of mental delay which is why he resides at skilled nursing facility.  Related to sepsis, uremia, initial ammonia level was high but decreased to 60 range Plan Ct  lactulose and rifaximin - Continue fentanyl drip, add Precedex now that blood pressure improved with goal RA SS -1    History of seizure Plan Continue home meds including carbamazepine, phenobarbital and Neurontin  Acute respiratory failure: Secondary to ineffective airway  protection Plan Start spontaneous breathing trials  Acute on chronic renal failure, has baseline's CKD stage IV his last creatinine was measured at 3.9 from last discharge Renal ultrasound-no hydronephrosis -Now on CRRT,  metabolic acidosis resolved Plan CRRT per renal, can start negative balance now that blood pressure improved Renal dose medications Off bicarbonate drip  Urinary tract infection, growing Klebsiella as well as enterococcus Plan Cont meropenem as there was concern for ESBL producer Continue ampicillin, await sensitivities from [place]   History of chronic liver disease/presumed cirrhosis-LFTs normal Plan Lactulose and rifaximin, since he is having liquid stool, decrease lactulose to 20 g  Anemia of chronic disease Appears as though baseline hemoglobin ranges from 7-8, some blood loss from left IJ HD catheter site Thrombocytopenia Plan Transfuse for hemoglobin less than 7 Thrombi-pad for left IJ site Check PT/INR for completion   Diabetes type 2 with hyperglycemia Plan SSI    Best practice:  Diet: TFs Pain/Anxiety/Delirium protocol (if indicated): 3/14 VAP protocol (if indicated): 3/14 DVT prophylaxis: Abrams heparin  GI prophylaxis: H2B Glucose control: ssi Mobility: BR Code Status: full code  Family Communication: pending  Disposition: Critically ill on vent, CRRT with encephalopathy, needs ICU  Summary-guarded prognosis due to multiorgan failure but needing minimal O2 and off pressors so remain hopeful that we should be able to extubate him.  Current issues today being bleeding from central line and agitation/encephalopathy  The patient is critically ill with multiple organ systems failure and requires high complexity decision making for assessment and support, frequent evaluation and titration of therapies, application of advanced monitoring technologies and extensive interpretation of multiple databases. Critical Care Time devoted to patient care services described in this note independent of APP/resident  time is 35 minutes.    Kara Mead MD. Shade Flood. [place] [facility] Pager [phone] If no response call [phone]   04/09/2018

## 2018-04-09 NOTE — Progress Notes (Signed)
Subjective:  Initiated on CRRT yesterday- weaned off pressors- agitated req restraints- K is low - making a little urine  Objective Vital signs in last 24 hours: Vitals:   04/09/18 0530 04/09/18 0545 04/09/18 0600 04/09/18 0615  BP: 124/74 106/60 (!) 99/56 (!) 94/56  Pulse: 67 66 64 63  Resp: 16 16 16 16   Temp:      TempSrc:      SpO2: 94% 93% 94% 95%  Weight:      Height:       Weight change: 0.3 kg  Intake/Output Summary (Last 24 hours) at 04/09/2018 0651 Last data filed at 04/09/2018 0600 Gross per 24 hour  Intake 6467.53 ml  Output 4767 ml  Net 1700.53 ml    Assessment/ Plan: Pt is a 48 y.o. yo male cognitive delay/SNF, liver dz, sz d/o and also stage 4 CKD (reported crt 3.9) pt who was admitted on 04/07/2018 with altered MS  Assessment/Plan: 1. Renal- A on CRF vs progression of CKD.  Right now requiring RRT in the form of CRRT - started 3/15 via vascath- machine running well-  2. HTN/vol-  Mild dep edema but not too overloaded- running even with CRRT- hemodynamically more stable- making a little urine    3. Anemia- req transfusion yest- will add ESA- no iron check as is septic- no heparin with CRRT  4. Elytes- change to all 4 K dialysate and stop bicarb - Ok with getting K this AM 5. ID- meropenam and ancef- thought to have UTI - bld cx neg to date    Louis Meckel    Labs: Basic Metabolic Panel: Recent Labs  Lab 04/08/18 0207  04/08/18 1705 04/09/18 0313 04/09/18 0328  NA 138   < > 138 139 138  K 2.9*   < > 2.8* 3.0* 3.0*  CL 110  --  109  --  105  CO2 14*  --  18*  --  24  GLUCOSE 102*  --  86  --  147*  BUN 48*  --  42*  --  34*  CREATININE 7.47*  --  6.09*  --  4.91*  CALCIUM 6.4*  --  5.9*  --  6.2*  PHOS 11.7*  --  9.4*  --  6.6*   < > = values in this interval not displayed.   Liver Function Tests: Recent Labs  Lab 04/07/18 1948 04/08/18 0207 04/08/18 1705 04/09/18 0328  AST 8*  --   --   --   ALT 9  --   --   --   ALKPHOS 131*  --    --   --   BILITOT 0.6  --   --   --   PROT 5.7*  --   --   --   ALBUMIN 2.3* 2.3* 2.1* 2.2*   Recent Labs  Lab 04/07/18 1948  LIPASE 17   Recent Labs  Lab 04/07/18 1948 04/08/18 0207  AMMONIA 50* 66*   CBC: Recent Labs  Lab 04/07/18 1948  04/08/18 0207  04/08/18 0827 04/08/18 1628 04/09/18 0313 04/09/18 0328  WBC 4.6  --  5.2  --  6.7  --   --  7.2  NEUTROABS 3.3  --   --   --   --   --   --   --   HGB 6.6*   < > 6.2*   < > 7.0* 8.5* 8.5* 7.3*  HCT 22.7*   < > 21.1*   < >  23.4* 25.0* 25.0* 23.4*  MCV 97.0  --  95.9  --  95.9  --   --  93.2  PLT PLATELET CLUMPS NOTED ON SMEAR, COUNT APPEARS DECREASED  --  PLATELET CLUMPS NOTED ON SMEAR, COUNT APPEARS DECREASED  --  96*  --   --  94*   < > = values in this interval not displayed.   Cardiac Enzymes: No results for input(s): CKTOTAL, CKMB, CKMBINDEX, TROPONINI in the last 168 hours. CBG: Recent Labs  Lab 04/08/18 1523 04/08/18 1714 04/08/18 1925 04/08/18 2331 04/09/18 0335  GLUCAP 51* 91 74 104* 135*    Iron Studies: No results for input(s): IRON, TIBC, TRANSFERRIN, FERRITIN in the last 72 hours. Studies/Results: Dg Chest 1 View  Result Date: 04/08/2018 CLINICAL DATA:  Hemodialysis catheter insertion. EXAM: CHEST  1 VIEW COMPARISON:  04/08/2018 FINDINGS: There is a right IJ central venous catheter in the mid SVC. The catheter tip is long lateral wall near the brachiocephalic SVC junction. The endotracheal tube is in good position at the mid tracheal level. There is an NG tube coursing down the esophagus and into the stomach. Stable cardiac enlargement and prominent mediastinum. Persistent pulmonary edema and pleural effusions. Slight improved lung aeration when compared to earlier film. IMPRESSION: 1. Left IJ central venous catheter tip in the SVC near the brachiocephalic SVC junction. 2. Right subclavian central venous catheter is stable. 3. Stable ET and NG tubes. 4. Persistent cardiac enlargement, central vascular  congestion and pulmonary edema but slight improved aeration since earlier film. 5. Small effusions. Electronically Signed   By: Marijo Sanes M.D.   On: 04/08/2018 13:11   US Renal  Result Date: 04/07/2018 CLINICAL DATA:  Acute renal failure. EXAM: RENAL / URINARY TRACT ULTRASOUND COMPLETE COMPARISON:  Yesterday. FINDINGS: Right Kidney: Renal measurements: 13.2 x 6.8 x 6 0 cm = volume: 281 mL . Echogenicity within normal limits. No mass or hydronephrosis visualized. Left Kidney: Renal measurements: 13.2 x 6 0 x 5.2 cm = volume: 215 mL. Echogenicity within normal limits. No mass or hydronephrosis visualized. Bladder: Not visualized with a Foley catheter in place. IMPRESSION: 1. Normal kidneys without hydronephrosis. 2. Nonvisualized urinary bladder with a Foley catheter in place. Electronically Signed   By: Claudie Revering M.D.   On: 04/07/2018 19:38   Dg Chest Port 1 View  Result Date: 04/09/2018 CLINICAL DATA:  Shortness of breath EXAM: PORTABLE CHEST 1 VIEW COMPARISON:  04/08/2018 FINDINGS: Endotracheal tube tip is at the level of the clavicular heads. Left IJ central venous catheter tip is in the mid SVC. Orogastric tube tip is beyond the field of view. There is moderate cardiomegaly with mild pulmonary edema and small pleural effusions. Right subclavian approach central venous catheter is unchanged. IMPRESSION: Cardiomegaly, small pleural effusions and mild pulmonary edema. Unchanged support apparatus. Electronically Signed   By: Ulyses Jarred M.D.   On: 04/09/2018 06:18   Dg Chest Port 1 View  Result Date: 04/08/2018 CLINICAL DATA:  Respiratory failure EXAM: PORTABLE CHEST 1 VIEW COMPARISON:  04/07/2018 FINDINGS: Support Apparatus: --Endotracheal tube: Tip at the level of the clavicular heads. --Enteric tube:Tip and sideport are below the field of view. --Catheter(s):Right subclavian vein approach central venous catheter tip is at the lower SVC --Other: None Unchanged cardiomegaly and mild pulmonary  edema. Failing pleural effusions are also unchanged. IMPRESSION: Unchanged appearance of the chest with findings of congestive heart failure. Unchanged support apparatus. Electronically Signed   By: Ulyses Jarred M.D.   On: 04/08/2018  05:17   Dg Chest Port 1 View  Result Date: 04/07/2018 CLINICAL DATA:  Acute respiratory failure. EXAM: PORTABLE CHEST 1 VIEW COMPARISON:  Earlier today. FINDINGS: Endotracheal tube in satisfactory position. Stable right central venous catheter. Nasogastric tube extending into the stomach. Progressive enlargement of the cardiac silhouette. Diffuse prominence of the pulmonary vasculature and interstitial markings with less bilateral airspace opacity with an improved inspiration. Unremarkable bones. IMPRESSION: Mildly progressive cardiomegaly with grossly stable changes of congestive heart failure. Electronically Signed   By: Claudie Revering M.D.   On: 04/07/2018 19:42   Medications: Infusions: . sodium chloride 10 mL/hr at 04/09/18 0600  . ampicillin (OMNIPEN) IV Stopped (04/09/18 0220)  . fentaNYL infusion INTRAVENOUS 300 mcg/hr (04/09/18 0600)  . meropenem (MERREM) IV Stopped (04/08/18 2231)  . norepinephrine (LEVOPHED) Adult infusion Stopped (04/09/18 0531)  . potassium chloride 10 mEq (04/09/18 0600)  . prismasol BGK 4/2.5 2,000 mL/hr at 04/09/18 0538  .  sodium bicarbonate  infusion 1000 mL 125 mL/hr at 04/09/18 0600  . sodium bicarbonate (isotonic) 1000 mL infusion 200 mL/hr at 04/09/18 0256  . sodium bicarbonate (isotonic) 1000 mL infusion 200 mL/hr at 04/09/18 0256    Scheduled Medications: . carBAMazepine  400 mg Oral TID  . chlorhexidine gluconate (MEDLINE KIT)  15 mL Mouth Rinse BID  . famotidine (PEPCID) IV  20 mg Intravenous Q12H  . feeding supplement (PRO-STAT SUGAR FREE 64)  30 mL Per Tube BID  . feeding supplement (VITAL HIGH PROTEIN)  1,000 mL Per Tube Q24H  . heparin  5,000 Units Subcutaneous Q8H  . insulin aspart  0-20 Units Subcutaneous Q4H  .  lactulose  30 g Per Tube TID  . mouth rinse  15 mL Mouth Rinse 10 times per day  . phenobarbital  226.8 mg Oral QHS  . rifaximin  550 mg Per Tube BID    have reviewed scheduled and prn medications.  Physical Exam: General: eyes open- no commands- on vent Heart: RRR Lungs: mostly clear Abdomen: distended- non tender Extremities:min dep edema Dialysis Access: left IJ vascath placed 3/15    04/09/2018,6:51 AM  LOS: 2 days

## 2018-04-09 NOTE — Progress Notes (Signed)
[place] Progress Note Patient Name: Eddie Davies DOB: [birth year] MRN: [id number]   Date of Service  04/09/2018  HPI/Events of Note  K 3.0, Mg 2.0, on CRRT  eICU Interventions  Ordered K 40 meqs over 4 hours     Intervention Category Major Interventions: Electrolyte abnormality - evaluation and management  Judd Lien 04/09/2018, 5:48 AM

## 2018-04-10 ENCOUNTER — Inpatient Hospital Stay (HOSPITAL_COMMUNITY): Payer: Medicare Other

## 2018-04-10 LAB — CBC
HEMATOCRIT: 23.5 % — AB (ref 39.0–52.0)
HEMOGLOBIN: 7.4 g/dL — AB (ref 13.0–17.0)
MCH: 30 pg (ref 26.0–34.0)
MCHC: 31.5 g/dL (ref 30.0–36.0)
MCV: 95.1 fL (ref 80.0–100.0)
Platelets: 109 10*3/uL — ABNORMAL LOW (ref 150–400)
RBC: 2.47 MIL/uL — ABNORMAL LOW (ref 4.22–5.81)
RDW: 17.5 % — ABNORMAL HIGH (ref 11.5–15.5)
WBC: 7.2 10*3/uL (ref 4.0–10.5)
nRBC: 0.8 % — ABNORMAL HIGH (ref 0.0–0.2)

## 2018-04-10 LAB — RENAL FUNCTION PANEL
ALBUMIN: 2.1 g/dL — AB (ref 3.5–5.0)
Albumin: 2.3 g/dL — ABNORMAL LOW (ref 3.5–5.0)
Anion gap: 9 (ref 5–15)
Anion gap: 10 (ref 5–15)
BUN: 20 mg/dL (ref 6–20)
BUN: 16 mg/dL (ref 6–20)
CALCIUM: 7.4 mg/dL — AB (ref 8.9–10.3)
CHLORIDE: 102 mmol/L (ref 98–111)
CO2: 25 mmol/L (ref 22–32)
CO2: 27 mmol/L (ref 22–32)
Calcium: 7 mg/dL — ABNORMAL LOW (ref 8.9–10.3)
Chloride: 105 mmol/L (ref 98–111)
Creatinine, Ser: 2.71 mg/dL — ABNORMAL HIGH (ref 0.61–1.24)
Creatinine, Ser: 1.87 mg/dL — ABNORMAL HIGH (ref 0.61–1.24)
GFR calc Af Amer: 31 mL/min — ABNORMAL LOW (ref >60)
GFR calc Af Amer: 49 mL/min — ABNORMAL LOW (ref >60)
GFR calc non Af Amer: 27 mL/min — ABNORMAL LOW (ref >60)
GFR calc non Af Amer: 42 mL/min — ABNORMAL LOW (ref >60)
Glucose, Bld: 242 mg/dL — ABNORMAL HIGH (ref 70–99)
Glucose, Bld: 106 mg/dL — ABNORMAL HIGH (ref 70–99)
POTASSIUM: 3.1 mmol/L — AB (ref 3.5–5.1)
Phosphorus: 4 mg/dL (ref 2.5–4.6)
Phosphorus: 3.2 mg/dL (ref 2.5–4.6)
Potassium: 3.2 mmol/L — ABNORMAL LOW (ref 3.5–5.1)
Sodium: 136 mmol/L (ref 135–145)
Sodium: 142 mmol/L (ref 135–145)

## 2018-04-10 LAB — GLUCOSE, CAPILLARY
Glucose-Capillary: 219 mg/dL — ABNORMAL HIGH (ref 70–99)
Glucose-Capillary: 198 mg/dL — ABNORMAL HIGH (ref 70–99)
Glucose-Capillary: 145 mg/dL — ABNORMAL HIGH (ref 70–99)
Glucose-Capillary: 118 mg/dL — ABNORMAL HIGH (ref 70–99)
Glucose-Capillary: 91 mg/dL (ref 70–99)
Glucose-Capillary: 119 mg/dL — ABNORMAL HIGH (ref 70–99)

## 2018-04-10 LAB — MAGNESIUM: Magnesium: 2.3 mg/dL (ref 1.7–2.4)

## 2018-04-10 LAB — PATHOLOGIST SMEAR REVIEW

## 2018-04-10 MED ORDER — POTASSIUM CHLORIDE 10 MEQ/50ML IV SOLN
10.0000 meq | INTRAVENOUS | Status: AC
  Administered 2018-04-10 (×2): 10 meq via INTRAVENOUS
  Filled 2018-04-10 (×3): qty 50

## 2018-04-10 MED ORDER — GABAPENTIN 250 MG/5ML PO SOLN
100.0000 mg | Freq: Three times a day (TID) | ORAL | Status: DC
  Administered 2018-04-10: 100 mg via ORAL
  Filled 2018-04-10 (×2): qty 2

## 2018-04-10 MED ORDER — ORAL CARE MOUTH RINSE
15.0000 mL | Freq: Two times a day (BID) | OROMUCOSAL | Status: DC
  Administered 2018-04-10 – 2018-04-14 (×7): 15 mL via OROMUCOSAL

## 2018-04-10 MED ORDER — LACTULOSE 10 GM/15ML PO SOLN
20.0000 g | Freq: Three times a day (TID) | ORAL | Status: DC
  Administered 2018-04-10 – 2018-04-12 (×5): 20 g via ORAL
  Filled 2018-04-10 (×5): qty 30

## 2018-04-10 MED ORDER — ONDANSETRON HCL 4 MG/2ML IJ SOLN
4.0000 mg | Freq: Four times a day (QID) | INTRAMUSCULAR | Status: DC | PRN
  Administered 2018-04-10 – 2018-04-11 (×2): 4 mg via INTRAVENOUS
  Filled 2018-04-10: qty 2

## 2018-04-10 MED ORDER — ONDANSETRON HCL 4 MG/2ML IJ SOLN
INTRAMUSCULAR | Status: AC
  Administered 2018-04-10: 4 mg via INTRAVENOUS
  Filled 2018-04-10: qty 2

## 2018-04-10 MED ORDER — POTASSIUM CHLORIDE CRYS ER 20 MEQ PO TBCR
40.0000 meq | EXTENDED_RELEASE_TABLET | Freq: Four times a day (QID) | ORAL | Status: DC
  Filled 2018-04-10: qty 2

## 2018-04-10 MED ORDER — FENTANYL CITRATE (PF) 100 MCG/2ML IJ SOLN
INTRAMUSCULAR | Status: AC
  Administered 2018-04-10: 25 ug via INTRAVENOUS
  Filled 2018-04-10: qty 2

## 2018-04-10 MED ORDER — FAMOTIDINE 40 MG/5ML PO SUSR
20.0000 mg | Freq: Every day | ORAL | Status: DC
  Administered 2018-04-10: 20 mg
  Filled 2018-04-10 (×2): qty 2.5

## 2018-04-10 MED ORDER — FENTANYL CITRATE (PF) 100 MCG/2ML IJ SOLN
12.5000 ug | INTRAMUSCULAR | Status: DC | PRN
  Administered 2018-04-10 (×2): 25 ug via INTRAVENOUS
  Filled 2018-04-10: qty 2

## 2018-04-10 MED ORDER — POTASSIUM CHLORIDE 10 MEQ/50ML IV SOLN
10.0000 meq | INTRAVENOUS | Status: AC
  Administered 2018-04-10 – 2018-04-11 (×6): 10 meq via INTRAVENOUS
  Filled 2018-04-10 (×6): qty 50

## 2018-04-10 MED ORDER — INSULIN ASPART 100 UNIT/ML ~~LOC~~ SOLN
1.0000 [IU] | SUBCUTANEOUS | Status: DC
  Administered 2018-04-10 – 2018-04-11 (×3): 1 [IU] via SUBCUTANEOUS

## 2018-04-10 MED ORDER — GABAPENTIN 250 MG/5ML PO SOLN
100.0000 mg | Freq: Three times a day (TID) | ORAL | Status: DC
  Administered 2018-04-10: 100 mg
  Filled 2018-04-10 (×3): qty 2

## 2018-04-10 NOTE — Evaluation (Signed)
Clinical/Bedside Swallow Evaluation Patient Details  Name: Eddie Davies MRN: [id number] Date of Birth: 02-20-1970  Today's Date: 04/10/2018 Time: SLP Start Time (ACUTE ONLY): 2778 SLP Stop Time (ACUTE ONLY): 1554 SLP Time Calculation (min) (ACUTE ONLY): 9 min  Past Medical History: No past medical history on file. Past Surgical History:  The histories are not reviewed yet. Please review them in the "History" navigator section and refresh this [place]. HPI:  Pt is a 48 yo male admitted from SNF with acute renal failure 2/2 sepsis felt to be related to UTI. ETT 3/14-3/17. PMH significant for cognitive delay, DM type 2, chronic liver disease, CKD stage 4, HTN, seizure disorder, morbid obesity.    Assessment / Plan / Recommendation Clinical Impression  Pt is drowsy and requires physical assistance to keep his head upright for PO trials. He has generalized weakness, and to get boluses into his mouth and orally transit them appears effortful. He has no mastication of ice chips and simply swallows them whole. Despite the above, he has no overt signs of aspiration, and his cough and vocal quality appear strong. Although he is not ready for a diet today, his prognosis to return to one is good. For today, would keep NPO except for meds crushed in puree. SLP will f/u for readiness. SLP Visit Diagnosis: Dysphagia, unspecified (R13.10)    Aspiration Risk  Moderate aspiration risk    Diet Recommendation NPO except meds   Medication Administration: Crushed with puree    Other  Recommendations Oral Care Recommendations: Oral care QID Other Recommendations: Have oral suction available   Follow up Recommendations Skilled Nursing facility      Frequency and Duration min 2x/week  2 weeks       Prognosis Prognosis for Safe Diet Advancement: Good Barriers to Reach Goals: Cognitive deficits      Swallow Study   General HPI: Pt is a 48 yo male admitted from SNF with acute renal failure 2/2  sepsis felt to be related to UTI. ETT 3/14-3/17. PMH significant for cognitive delay, DM type 2, chronic liver disease, CKD stage 4, HTN, seizure disorder, morbid obesity.  Type of Study: Bedside Swallow Evaluation Previous Swallow Assessment: none in chart Diet Prior to this Study: NPO Temperature Spikes Noted: No Respiratory Status: Nasal cannula History of Recent Intubation: Yes Length of Intubations (days): 4 days Date extubated: 04/10/18 Behavior/Cognition: Cooperative;Lethargic/Drowsy;Requires cueing Oral Cavity Assessment: Within Functional Limits Oral Care Completed by SLP: Yes Oral Cavity - Dentition: Missing dentition Self-Feeding Abilities: Total assist Patient Positioning: Postural control interferes with function Baseline Vocal Quality: Normal Volitional Cough: Strong Volitional Swallow: Able to elicit    Oral/Motor/Sensory Function Overall Oral Motor/Sensory Function: Generalized oral weakness   Ice Chips Ice chips: Impaired Presentation: Spoon Oral Phase Impairments: Impaired mastication;Other (comment)(pt swallows them whole)   Thin Liquid Thin Liquid: Impaired Presentation: Straw Oral Phase Impairments: Reduced labial seal;Poor awareness of bolus    Nectar Thick Nectar Thick Liquid: Not tested   Honey Thick Honey Thick Liquid: Not tested   Puree Puree: Impaired Presentation: Spoon Oral Phase Impairments: Reduced labial seal;Reduced lingual movement/coordination Oral Phase Functional Implications: Prolonged oral transit   Solid     Solid: Not tested      Venita Sheffield Kito Cuffe 04/10/2018,4:03 PM  [name], M.A. [place] Acute Environmental education officer [phone] Office [phone]

## 2018-04-10 NOTE — Progress Notes (Signed)
Pt extubated to 3L o2 via [place] with no complications. RT and this RN at the bedside, o2 sats WNL at this time.

## 2018-04-10 NOTE — Progress Notes (Signed)
SLP Cancellation Note  Patient Details Name: Eddie Davies MRN: [id number] DOB: December 27, 1970   Cancelled treatment:       Reason Eval/Treat Not Completed: Medical issues which prohibited therapy; will monitor for extubation and PO readiness   Ellarie Picking E Elara Cocke MA, CCC-SLP  Acute Rehab Speech Language Pathologist  04/10/2018, 9:23 AM

## 2018-04-10 NOTE — Progress Notes (Signed)
Subjective:  Hemodynamics improved but still on pressors- made 2 liters of urine !?  Were also abe to remove 1500 with CRRT over last 24 hours- no machine issues  Objective Vital signs in last 24 hours: Vitals:   04/10/18 0530 04/10/18 0545 04/10/18 0600 04/10/18 0706  BP: 136/74 123/71 105/60   Pulse: 84 74 72   Resp: 11 16 (!) 0   Temp:    97.9 F (36.6 C)  TempSrc:    Axillary  SpO2: 91% 100% 94%   Weight:      Height:       Weight change: 1.9 kg  Intake/Output Summary (Last 24 hours) at 04/10/2018 1610 Last data filed at 04/10/2018 0600 Gross per 24 hour  Intake 4093.01 ml  Output 5613 ml  Net -1519.99 ml    Assessment/ Plan: Pt is a 48 y.o. yo male cognitive delay/SNF, liver dz, sz d/o and also stage 4 CKD (reported baseline crt 3.9) pt who was admitted on 04/07/2018 with altered MS  Assessment/Plan: 1. Renal- A on CRF vs progression of CKD.  Right now requiring RRT in the form of CRRT - started 3/15 via vascath- machine running well- numbers improved and also making some urine.  Given still hemodynamic instability and overall global illness along with advanced CKD at baseline, will cont CRRT for now 2. HTN/vol-  dep edema but not too overloaded, Fio2 30%- now running 50 per hour negative with CRRT- will inc to minus 100 per hour 3. Anemia- req transfusion yest- have added ESA- no iron check as is septic- no heparin with CRRT  4. Elytes- changed to all 4 K dialysate and stopped bicarb - Ok with getting more K this AM- on vital TF 5. ID- meropenam and amp- thought to have UTI - bld cx neg to date.  Urine showing yeast- consider antifungal, will defer to CCM/pharmacy  6. Liver dz- rifampin/lactulose - ammonia down   Eddie Davies    Labs: Basic Metabolic Panel: Recent Labs  Lab 04/09/18 0328 04/09/18 1613 04/10/18 0229  NA 138 139 136  K 3.0* 3.3* 3.2*  CL 105 103 102  CO2 _0 GLUCOSE 147* 122* 242*  BUN 34* 26* 20  CREATININE 4.91* 3.51* 2.71*   CALCIUM 6.2* 6.7* 7.0*  PHOS 6.6* 4.7*  4.9* 4.0   Liver Function Tests: Recent Labs  Lab 04/07/18 1948  04/09/18 0328 04/09/18 1613 04/10/18 0229  AST 8*  --   --   --   --   ALT 9  --   --   --   --   ALKPHOS 131*  --   --   --   --   BILITOT 0.6  --   --   --   --   PROT 5.7*  --   --   --   --   ALBUMIN 2.3*   < > 2.2* 2.2* 2.3*   < > = values in this interval not displayed.   Recent Labs  Lab 04/07/18 1948  LIPASE 17   Recent Labs  Lab 04/07/18 1948 04/08/18 0207  AMMONIA 50* 66*   CBC: Recent Labs  Lab 04/07/18 1948  04/08/18 0207  04/08/18 0827  04/09/18 0313 04/09/18 0328 04/10/18 0229  WBC 4.6  --  5.2  --  6.7  --   --  7.2 7.2  NEUTROABS 3.3  --   --   --   --   --   --   --   --  HGB 6.6*   < > 6.2*   < > 7.0*   < > 8.5* 7.3* 7.4*  HCT 22.7*   < > 21.1*   < > 23.4*   < > 25.0* 23.4* 23.5*  MCV 97.0  --  95.9  --  95.9  --   --  93.2 95.1  PLT PLATELET CLUMPS NOTED ON SMEAR, COUNT APPEARS DECREASED  --  PLATELET CLUMPS NOTED ON SMEAR, COUNT APPEARS DECREASED  --  96*  --   --  94* 109*   < > = values in this interval not displayed.   Cardiac Enzymes: No results for input(s): CKTOTAL, CKMB, CKMBINDEX, TROPONINI in the last 168 hours. CBG: Recent Labs  Lab 04/09/18 1501 04/09/18 1919 04/09/18 2320 04/10/18 0338 04/10/18 0703  GLUCAP 85 151* 191* 219* 198*    Iron Studies: No results for input(s): IRON, TIBC, TRANSFERRIN, FERRITIN in the last 72 hours. Studies/Results: Dg Chest 1 View  Result Date: 04/08/2018 CLINICAL DATA:  Hemodialysis catheter insertion. EXAM: CHEST  1 VIEW COMPARISON:  04/08/2018 FINDINGS: There is a right IJ central venous catheter in the mid SVC. The catheter tip is long lateral wall near the brachiocephalic SVC junction. The endotracheal tube is in good position at the mid tracheal level. There is an NG tube coursing down the esophagus and into the stomach. Stable cardiac enlargement and prominent mediastinum.  Persistent pulmonary edema and pleural effusions. Slight improved lung aeration when compared to earlier film. IMPRESSION: 1. Left IJ central venous catheter tip in the SVC near the brachiocephalic SVC junction. 2. Right subclavian central venous catheter is stable. 3. Stable ET and NG tubes. 4. Persistent cardiac enlargement, central vascular congestion and pulmonary edema but slight improved aeration since earlier film. 5. Small effusions. Electronically Signed   By: Marijo Sanes M.D.   On: 04/08/2018 13:11   Dg Chest Port 1 View  Result Date: 04/09/2018 CLINICAL DATA:  Shortness of breath EXAM: PORTABLE CHEST 1 VIEW COMPARISON:  04/08/2018 FINDINGS: Endotracheal tube tip is at the level of the clavicular heads. Left IJ central venous catheter tip is in the mid SVC. Orogastric tube tip is beyond the field of view. There is moderate cardiomegaly with mild pulmonary edema and small pleural effusions. Right subclavian approach central venous catheter is unchanged. IMPRESSION: Cardiomegaly, small pleural effusions and mild pulmonary edema. Unchanged support apparatus. Electronically Signed   By: Ulyses Jarred M.D.   On: 04/09/2018 06:18   Medications: Infusions: .  prismasol BGK 4/2.5 300 mL/hr at 04/10/18 0057  .  prismasol BGK 4/2.5 300 mL/hr at 04/10/18 0057  . sodium chloride 10 mL/hr at 04/10/18 0600  . ampicillin (OMNIPEN) IV Stopped (04/10/18 0219)  . dexmedetomidine (PRECEDEX) IV infusion 0.5 mcg/kg/hr (04/10/18 0600)  . feeding supplement (VITAL 1.5 CAL) 1,000 mL (04/09/18 1727)  . fentaNYL infusion INTRAVENOUS 400 mcg/hr (04/10/18 0600)  . meropenem (MERREM) IV Stopped (04/09/18 2131)  . norepinephrine (LEVOPHED) Adult infusion 1 mcg/min (04/10/18 0600)  . potassium chloride 10 mEq (04/10/18 0609)  . prismasol BGK 4/2.5 2,000 mL/hr at 04/10/18 7654    Scheduled Medications: . carBAMazepine  400 mg Oral TID  . chlorhexidine gluconate (MEDLINE KIT)  15 mL Mouth Rinse BID  . darbepoetin  (ARANESP) injection - NON-DIALYSIS  150 mcg Subcutaneous Q Mon-1800  . famotidine  20 mg Per Tube BID  . feeding supplement (PRO-STAT SUGAR FREE 64)  30 mL Per Tube BID  . gabapentin  100 mg Per Tube TID  .  heparin  5,000 Units Subcutaneous Q8H  . insulin aspart  0-20 Units Subcutaneous Q4H  . lactulose  20 g Per Tube TID  . mouth rinse  15 mL Mouth Rinse 10 times per day  . phenobarbital  226.8 mg Oral QHS  . rifaximin  550 mg Per Tube BID    have reviewed scheduled and prn medications.  Physical Exam: General: more sedated- on vent Heart: RRR Lungs: mostly clear Abdomen: distended- non tender Extremities: dep edema Dialysis Access: left IJ vascath placed 3/15    04/10/2018,7:07 AM  LOS: 3 days

## 2018-04-10 NOTE — Progress Notes (Signed)
[place] Progress Note Patient Name: Eddie Davies DOB: [birth year] MRN: [id number]   Date of Service  04/10/2018  HPI/Events of Note  Patient refuses to take PO K+.   eICU Interventions  Will order: 1. Change route of K+ replacement to IV.      Intervention Category Major Interventions: Electrolyte abnormality - evaluation and management  Sommer,Steven Eugene 04/10/2018, 7:32 PM

## 2018-04-10 NOTE — Progress Notes (Signed)
[place] Progress Note Patient Name: Eddie Davies DOB: 1970-03-31 MRN: [id number]   Date of Service  04/10/2018  HPI/Events of Note  Nausea - QTc interval = 0.46   eICU Interventions  Will order: 1. Zofram 4 mg IV Q 6 hours PRN N/V.      Intervention Category Major Interventions: Other:  Lysle Dingwall 04/10/2018, 11:37 PM

## 2018-04-10 NOTE — Progress Notes (Signed)
[place] Progress Note Patient Name: Eddie Davies DOB: [birth year] MRN: [id number]   Date of Service  04/10/2018  HPI/Events of Note  K+ = 3.2. Patient on CRRT with 4.0 meq/liter of K+ in dialysis replacement.   eICU Interventions  Will cautiously replace K+.      Intervention Category Major Interventions: Electrolyte abnormality - evaluation and management  Kalyn Dimattia Eugene 04/10/2018, 4:29 AM

## 2018-04-10 NOTE — Progress Notes (Signed)
Inpatient Diabetes Program Recommendations  AACE/ADA: New Consensus Statement on Inpatient Glycemic Control (2015)  Target Ranges:  Prepandial:   less than 140 mg/dL      Peak postprandial:   less than 180 mg/dL (1-2 hours)      Critically ill patients:  140 - 180 mg/dL   Lab Results  Component Value Date   GLUCAP 198 (H) 04/10/2018   HGBA1C 8.3 (H) 04/07/2018    Review of Glycemic Control Results for Eddie Davies, Eddie Davies (MRN [id number]) as of 04/10/2018 11:19  Ref. Range 04/09/2018 19:19 04/09/2018 23:20 04/10/2018 03:38 04/10/2018 07:03  Glucose-Capillary Latest Ref Range: 70 - 99 mg/dL 151 (H) 191 (H) 219 (H) 198 (H)   Diabetes history: Type 2 DM Outpatient Diabetes medications: Basaglar 15 units BID, Humalog 0-12 units TID Current orders for Inpatient glycemic control: Novolog 0-20 units Q4H  Inpatient Diabetes Program Recommendations:    Noted extubation and discontinuation of tube feeds.  Given this and renal status, consider decreasing correction to Novolog 0-9 units Q4H.  Thanks, Bronson Curb, MSN, RNC-OB Diabetes Coordinator [phone] (8a-5p)

## 2018-04-10 NOTE — Progress Notes (Signed)
NAME:  Eddie Davies, MRN:  [id number], DOB:  1970/06/06, LOS: 3 ADMISSION DATE:  04/07/2018, CONSULTATION DATE:  3/14 REFERRING MD:  Graylon Good (from [facility]), CHIEF COMPLAINT: Altered mental status, and hepatic encephalopathy  Brief History   48 year old male patient who resides at skilled nursing facility due to cognitive delay.  Has underlying chronic liver disease, as well as chronic kidney disease stage IV.  Admitted 3/13 in [place] with altered sensorium and ammonia level of 195 as well as creatinine of 6.3.  Was admitted to the hospitalist service.  Further evaluate diagnostics revealed urinary tract infection with Klebsiella and enterococcus isolated.  He developed progressive neurological decline, worsening blood pressure/hypotension as well as worsening renal failure and metabolic acidosis and because of this he was transferred to [facility] for nephrology consultation and probable CRRT   Past Medical History  Cognitive delay, history of chronic liver disease, CKD stage IV, hypertension, seizure disorder, type 2 diabetes, history of medical noncompliance.  Significant Hospital Events   3/13 admitted to [facility] for acute renal failure, ammonia level of 195, and altered sensorium. 3/14: Renal failure worse, had risen from 6.3 up to 7.4.  Mental status continued to decline, became more hypotensive and was noted to have urinary tract infection.  Intubated for airway protection and transferred to [facility] 3/15 CRRT started 3/16 Left IJ HD catheter  Bleeding, off levo  Consults:  Nephrology  Procedures:  ETT 3/14 >> Right subclavian triple-lumen catheter 3/14 >> LIJ HD cath 3/15 >>   Significant Diagnostic Tests:  EEG 3/15 neg Micro Data:  Urine culture 3/13 enterococcus as well as Klebsiella these were obtained at [facility]  Antimicrobials:  Meropenem 3/14 >> ampi 3/15 >>  Interim history/subjective:   Afebrile, on CRRT. Remains  critically ill. Intermittent agitation on Precedex and fentanyl drips maximum dose Starting to make urine, good urine output 1200 cc over last 12 hours!    Objective   Blood pressure 117/61, pulse 82, temperature 97.9 F (36.6 C), temperature source Axillary, resp. rate 15, height 5' 10"  (1.778 m), weight (!) 148.7 kg, SpO2 97 %.    Vent Mode: PRVC FiO2 (%):  [30 %-50 %] 30 % Set Rate:  [16 bmp] 16 bmp Vt Set:  [580 mL] 580 mL PEEP:  [5 cmH20] 5 cmH20 Plateau Pressure:  [20 cmH20-32 cmH20] 32 cmH20   Intake/Output Summary (Last 24 hours) at 04/10/2018 0836 Last data filed at 04/10/2018 0800 Gross per 24 hour  Intake 4040.92 ml  Output 5495 ml  Net -1454.08 ml   Filed Weights   04/08/18 0345 04/09/18 0336 04/10/18 0500  Weight: (!) 146.5 kg (!) 146.8 kg (!) 148.7 kg   Examination: General: Chronically ill appearing male, sedated on fentanyl drip Mild pallor, no icterus, obese, short neck, bleeding from around left IJ HD catheter has stopped Flexi-Seal with liquid stool Clear urine in Foley Decreased breath sounds bilateral Soft nontender obese abdomen S1-S2 distant Agitated, moving all 4 extremities, sticks tongue out to command 2+ edema Skin - ichthyosis pigmentation all extremities  Resolved Hospital Problem list     Assessment & Plan:  Septic shock-likely related to severe acidosis, source appears to be UTI -Resolved, off pressors  Acute metabolic/hepatic encephalopathy.  He has underlying history of mental delay which is why he resides at skilled nursing facility.  Related to sepsis, uremia, initial ammonia level was high but decreased to 60 range Plan Ct  lactulose and rifaximin - dc fentanyl drip, ct  Precedex  with goal RA SS 0    History of seizure Plan Continue home meds including carbamazepine, phenobarbital and Neurontin  Acute respiratory failure: Secondary to ineffective airway protection Plan Tolerates spontaneous breathing trials and given  agitation may have to quickly extubate  Acute on chronic renal failure, has baseline's CKD stage IV his last creatinine was measured at 3.9 from last discharge Renal ultrasound-no hydronephrosis -Now on CRRT, metabolic acidosis resolved, making urine Plan CRRT per renal, achieving negative balance, starting to make urine and hopeful for renal recovery Renal dose medications   Urinary tract infection, growing Klebsiella as well as enterococcus Plan Cont meropenem as there was concern for ESBL producer Continue ampicillin, await sensitivities from [place]   History of chronic liver disease/presumed cirrhosis-LFTs normal Plan Lactulose 20 g and rifaximin  Anemia of chronic disease Appears as though baseline hemoglobin ranges from 7-8 Thrombocytopenia , INR ok Plan Transfuse for hemoglobin less than 7 Thrombi-pad for left IJ site    Diabetes type 2 with hyperglycemia Plan SSI    Best practice:  Diet: TFs Pain/Anxiety/Delirium protocol (if indicated): 3/14 VAP protocol (if indicated): 3/14 DVT prophylaxis: [place] heparin  GI prophylaxis: H2B Glucose control: ssi Mobility: BR Code Status: full code  Family Communication: pending  Disposition: Critically ill on vent, CRRT with encephalopathy, needs ICU  Summary-improving prognosis with multiorgan failure, hopeful to extubate today, starting to make urine so hopeful for some renal recovery  The patient is critically ill with multiple organ systems failure and requires high complexity decision making for assessment and support, frequent evaluation and titration of therapies, application of advanced monitoring technologies and extensive interpretation of multiple databases. Critical Care Time devoted to patient care services described in this note independent of APP/resident  time is 35 minutes.     Kara Mead MD. Shade Flood. [place] [facility] Pager [phone] If no response call [phone]   04/10/2018

## 2018-04-11 LAB — CBC
HEMATOCRIT: 23.5 % — AB (ref 39.0–52.0)
HEMOGLOBIN: 7.2 g/dL — AB (ref 13.0–17.0)
MCH: 29.5 pg (ref 26.0–34.0)
MCHC: 30.6 g/dL (ref 30.0–36.0)
MCV: 96.3 fL (ref 80.0–100.0)
Platelets: 103 10*3/uL — ABNORMAL LOW (ref 150–400)
RBC: 2.44 MIL/uL — ABNORMAL LOW (ref 4.22–5.81)
RDW: 16.8 % — ABNORMAL HIGH (ref 11.5–15.5)
WBC: 5.8 10*3/uL (ref 4.0–10.5)
nRBC: 0.3 % — ABNORMAL HIGH (ref 0.0–0.2)

## 2018-04-11 LAB — RENAL FUNCTION PANEL
Albumin: 2.1 g/dL — ABNORMAL LOW (ref 3.5–5.0)
Anion gap: 7 (ref 5–15)
BUN: 11 mg/dL (ref 6–20)
CO2: 30 mmol/L (ref 22–32)
Calcium: 7.6 mg/dL — ABNORMAL LOW (ref 8.9–10.3)
Chloride: 103 mmol/L (ref 98–111)
Creatinine, Ser: 1.64 mg/dL — ABNORMAL HIGH (ref 0.61–1.24)
GFR calc non Af Amer: 49 mL/min — ABNORMAL LOW (ref >60)
GFR, EST AFRICAN AMERICAN: 57 mL/min — AB (ref >60)
Glucose, Bld: 136 mg/dL — ABNORMAL HIGH (ref 70–99)
Phosphorus: 2.8 mg/dL (ref 2.5–4.6)
Potassium: 3.7 mmol/L (ref 3.5–5.1)
Sodium: 140 mmol/L (ref 135–145)

## 2018-04-11 LAB — GLUCOSE, CAPILLARY
Glucose-Capillary: 127 mg/dL — ABNORMAL HIGH (ref 70–99)
Glucose-Capillary: 148 mg/dL — ABNORMAL HIGH (ref 70–99)
Glucose-Capillary: 156 mg/dL — ABNORMAL HIGH (ref 70–99)
Glucose-Capillary: 178 mg/dL — ABNORMAL HIGH (ref 70–99)
Glucose-Capillary: 189 mg/dL — ABNORMAL HIGH (ref 70–99)

## 2018-04-11 LAB — MAGNESIUM: Magnesium: 2.1 mg/dL (ref 1.7–2.4)

## 2018-04-11 MED ORDER — INSULIN ASPART 100 UNIT/ML ~~LOC~~ SOLN
0.0000 [IU] | Freq: Three times a day (TID) | SUBCUTANEOUS | Status: DC
  Administered 2018-04-11 (×2): 2 [IU] via SUBCUTANEOUS
  Administered 2018-04-12: 7 [IU] via SUBCUTANEOUS
  Administered 2018-04-12: 5 [IU] via SUBCUTANEOUS
  Administered 2018-04-12: 3 [IU] via SUBCUTANEOUS
  Administered 2018-04-13: 7 [IU] via SUBCUTANEOUS
  Administered 2018-04-13: 9 [IU] via SUBCUTANEOUS
  Administered 2018-04-13: 5 [IU] via SUBCUTANEOUS
  Administered 2018-04-14 (×2): 7 [IU] via SUBCUTANEOUS

## 2018-04-11 MED ORDER — "THROMBI-PAD 3""X3"" EX PADS"
1.0000 | MEDICATED_PAD | Freq: Once | CUTANEOUS | Status: AC
  Administered 2018-04-11: 1 via TOPICAL
  Filled 2018-04-11: qty 1

## 2018-04-11 MED ORDER — FAMOTIDINE 20 MG PO TABS
20.0000 mg | ORAL_TABLET | ORAL | Status: DC
  Administered 2018-04-11 – 2018-04-13 (×3): 20 mg via ORAL
  Filled 2018-04-11 (×4): qty 1

## 2018-04-11 MED ORDER — CARBAMAZEPINE 200 MG PO TABS
400.0000 mg | ORAL_TABLET | Freq: Three times a day (TID) | ORAL | Status: DC
  Administered 2018-04-11 – 2018-04-14 (×8): 400 mg via ORAL
  Filled 2018-04-11 (×10): qty 2

## 2018-04-11 MED ORDER — SODIUM CHLORIDE 0.9 % IV SOLN
500.0000 mg | Freq: Two times a day (BID) | INTRAVENOUS | Status: DC
  Filled 2018-04-11: qty 2

## 2018-04-11 MED ORDER — RIFAXIMIN 550 MG PO TABS
550.0000 mg | ORAL_TABLET | Freq: Two times a day (BID) | ORAL | Status: DC
  Administered 2018-04-11 – 2018-04-14 (×6): 550 mg via ORAL
  Filled 2018-04-11 (×7): qty 1

## 2018-04-11 MED ORDER — GABAPENTIN 250 MG/5ML PO SOLN
200.0000 mg | Freq: Three times a day (TID) | ORAL | Status: DC
  Administered 2018-04-11 (×2): 200 mg via ORAL
  Filled 2018-04-11 (×3): qty 4

## 2018-04-11 MED ORDER — GABAPENTIN 100 MG PO CAPS
200.0000 mg | ORAL_CAPSULE | Freq: Three times a day (TID) | ORAL | Status: DC
  Administered 2018-04-11 – 2018-04-14 (×8): 200 mg via ORAL
  Filled 2018-04-11 (×10): qty 2

## 2018-04-11 NOTE — Progress Notes (Signed)
Assisted pt with return to bed, uncomfortable sitting on buttocks due to flexiseal. Pt responding favorable to use of music. Needing significantly more assistance to stand from lower surface of chair.  04/11/18 1317  OT Visit Information  Last OT Received On 04/11/18  Assistance Needed +2  PT/OT/SLP Co-Evaluation/Treatment Yes  Reason for Co-Treatment Complexity of the patient's impairments (multi-system involvement)  OT goals addressed during session Strengthening/ROM  History of Present Illness 48 year old male patient who resides at skilled nursing facility due to cognitive delay.  Has underlying chronic liver disease, as well as chronic kidney disease stage IV.  Admitted 3/13 in [place] with altered sensorium and ammonia level of 195 as well as creatinine of 6.3.  Was admitted to the hospitalist service.  Further evaluate diagnostics revealed urinary tract infection with Klebsiella and enterococcus isolated.  He developed progressive neurological decline, worsening blood pressure/hypotension as well as worsening renal failure and metabolic acidosis and because of this he was transferred to [facility] for nephrology consultation and probable CRRT  Precautions  Precautions Fall  Precaution Comments flexiseal  Pain Assessment  Pain Assessment Faces  Faces Pain Scale 8  Pain Location buttocks  Pain Descriptors / Indicators Grimacing  Pain Intervention(s) Monitored during session;Repositioned  Cognition  Arousal/Alertness Awake/alert  Behavior During Therapy Flat affect  Overall Cognitive Status No family/caregiver present to determine baseline cognitive functioning  Area of Impairment Orientation;Attention;Memory;Following commands;Safety/judgement;Awareness;Problem solving  Orientation Level Disoriented to;Time;Situation  Current Attention Level Focused  Memory Decreased short-term memory  Following Commands Follows one step commands inconsistently  Safety/Judgement Decreased  awareness of safety;Decreased awareness of deficits  Awareness Intellectual  Problem Solving Slow processing;Decreased initiation;Difficulty sequencing;Requires verbal cues;Requires tactile cues  General Comments patient with a significant delay in cognitive processing during task performance, did response well during second session with verbalizing to audio songs during session  Bed Mobility  Overal bed mobility Needs Assistance  Bed Mobility Sit to Supine;Rolling  Rolling Min assist;Mod assist  Sit to supine Mod assist;+2 for physical assistance  General bed mobility comments assist to guide trunk and for LEs back into bed, rolls with min assist to R, mod to L  Balance  Overall balance assessment Needs assistance  Sitting balance-Leahy Scale Fair  Standing balance-Leahy Scale Zero  Restrictions  Weight Bearing Restrictions No  Transfers  Overall transfer level Needs assistance  Equipment used 2 person hand held assist  Transfers Sit to/from Stand;Stand Pivot Transfers  Sit to Stand +2 physical assistance;Max assist  Stand pivot transfers +2 physical assistance;Max assist  General transfer comment pt requiring more assist from lower surface of chair to stand and pivot back to bed  OT - End of Session  Activity Tolerance Patient tolerated treatment well  Patient left in bed;with call bell/phone within reach;with bed alarm set;with nursing/sitter in room  Nurse Communication Mobility status  OT Assessment/Plan  OT Plan Discharge plan remains appropriate  OT Visit Diagnosis Unsteadiness on feet (R26.81);Pain;Muscle weakness (generalized) (M62.81);Other symptoms and signs involving cognitive function  OT Frequency (ACUTE ONLY) Min 2X/week  Follow Up Recommendations SNF;Supervision/Assistance - 24 hour  AM-PAC OT "6 Clicks" Daily Activity Outcome Measure (Version 2)  Help from another person eating meals? 3  Help from another person taking care of personal grooming? 3  Help from  another person toileting, which includes using toliet, bedpan, or urinal? 1  Help from another person bathing (including washing, rinsing, drying)? 2  Help from another person to put on and taking off regular upper body clothing?  2  Help from another person to put on and taking off regular lower body clothing? 1  6 Click Score 12  OT Goal Progression  Progress towards OT goals Progressing toward goals  Acute Rehab OT Goals  Patient Stated Goal to return to bed, flexiseal painful  OT Goal Formulation Patient unable to participate in goal setting  Time For Goal Achievement 04/25/18  Potential to Achieve Goals Good  OT Time Calculation  OT Start Time (ACUTE ONLY) 1005  OT Stop Time (ACUTE ONLY) 1029  OT Time Calculation (min) 24 min  OT General Charges  $OT Visit 1 Visit  OT Treatments  $Therapeutic Activity 8-22 mins  Nestor Lewandowsky, OTR/L Acute Rehabilitation Services Pager: [phone] Office: [phone]

## 2018-04-11 NOTE — Progress Notes (Signed)
Subjective:  Hemodynamics improved , extubated, made another 5 liters of urine - overall ran 5 liters negative   Objective Vital signs in last 24 hours: Vitals:   04/11/18 0500 04/11/18 0530 04/11/18 0600 04/11/18 0630  BP: 132/70 120/64 126/65 93/62  Pulse: 71 63 61 61  Resp: 12 15 13 14   Temp:      TempSrc:      SpO2: 100% 97% 99% 99%  Weight: (!) 148.7 kg     Height:       Weight change: 0 kg  Intake/Output Summary (Last 24 hours) at 04/11/2018 0703 Last data filed at 04/11/2018 0700 Gross per 24 hour  Intake 1445.45 ml  Output 6469 ml  Net -5023.55 ml    Assessment/ Plan: Pt is a 48 y.o. yo male cognitive delay/SNF, liver dz, sz d/o and also stage 4 CKD (reported baseline crt 3.9) pt who was admitted on 04/07/2018 with altered MS  Assessment/Plan: 1. Renal- A on CRF vs progression of CKD.  Requiring RRT in the form of CRRT - started 3/15 via vascath-- numbers improved and also making some urine.  Will stop CRRT and see what he does  2. HTN/vol-  dep edema but not too overloaded, overall negative and still making a lot of urine- extubated.  No need for lasix right now 3. Anemia- req transfusion 3/15- have added ESA- no iron check as is septic- no heparin with CRRT  4. Elytes- adequate for now- suspect K and phos will inc without HD  5. ID- meropenam and amp- thought to have UTI - bld cx neg to date.  Urine showing yeast- consider antifungal, will defer to CCM/pharmacy  6. Liver dz- rifampin/lactulose - ammonia down   Louis Meckel    Labs: Basic Metabolic Panel: Recent Labs  Lab 04/10/18 0229 04/10/18 1644 04/11/18 0255  NA 136 142 140  K 3.2* 3.1* 3.7  CL 102 105 103  CO2 25 27 30   GLUCOSE 242* 106* 136*  BUN 20 16 11   CREATININE 2.71* 1.87* 1.64*  CALCIUM 7.0* 7.4* 7.6*  PHOS 4.0 3.2 2.8   Liver Function Tests: Recent Labs  Lab 04/07/18 1948  04/10/18 0229 04/10/18 1644 04/11/18 0255  AST 8*  --   --   --   --   ALT 9  --   --   --   --    ALKPHOS 131*  --   --   --   --   BILITOT 0.6  --   --   --   --   PROT 5.7*  --   --   --   --   ALBUMIN 2.3*   < > 2.3* 2.1* 2.1*   < > = values in this interval not displayed.   Recent Labs  Lab 04/07/18 1948  LIPASE 17   Recent Labs  Lab 04/07/18 1948 04/08/18 0207  AMMONIA 50* 66*   CBC: Recent Labs  Lab 04/07/18 1948  04/08/18 0207  04/08/18 0827  04/09/18 0328 04/10/18 0229 04/11/18 0255  WBC 4.6  --  5.2  --  6.7  --  7.2 7.2 5.8  NEUTROABS 3.3  --   --   --   --   --   --   --   --   HGB 6.6*   < > 6.2*   < > 7.0*   < > 7.3* 7.4* 7.2*  HCT 22.7*   < > 21.1*   < > 23.4*   < >  23.4* 23.5* 23.5*  MCV 97.0  --  95.9  --  95.9  --  93.2 95.1 96.3  PLT PLATELET CLUMPS NOTED ON SMEAR, COUNT APPEARS DECREASED  --  PLATELET CLUMPS NOTED ON SMEAR, COUNT APPEARS DECREASED  --  96*  --  94* 109* 103*   < > = values in this interval not displayed.   Cardiac Enzymes: No results for input(s): CKTOTAL, CKMB, CKMBINDEX, TROPONINI in the last 168 hours. CBG: Recent Labs  Lab 04/10/18 1246 04/10/18 1505 04/10/18 1913 04/10/18 2326 04/11/18 0340  GLUCAP 145* 118* 91 119* 127*    Iron Studies: No results for input(s): IRON, TIBC, TRANSFERRIN, FERRITIN in the last 72 hours. Studies/Results: Dg Chest Port 1 View  Result Date: 04/10/2018 CLINICAL DATA:  Hypoxia EXAM: PORTABLE CHEST 1 VIEW COMPARISON:  April 09, 2018 FINDINGS: Endotracheal tube tip is 7.5 cm above the carina. Central catheter tip is at the junction of the superior vena cava and left innominate vein. Nasogastric tube tip and side port are below the diaphragm. No pneumothorax. : There is patchy atelectasis in the left lower lobe and right upper lobe regions. There is no appreciable edema or consolidation. Heart is mildly enlarged with pulmonary vascularity normal. No adenopathy. No bone lesions. IMPRESSION: Tube and catheter positions as described without pneumothorax. Areas of patchy atelectasis bilaterally. No  frank consolidation or edema. Stable cardiac enlargement. Electronically Signed   By: Lowella Grip III M.D.   On: 04/10/2018 07:16   Medications: Infusions: .  prismasol BGK 4/2.5 300 mL/hr at 04/10/18 1821  .  prismasol BGK 4/2.5 300 mL/hr at 04/10/18 1821  . sodium chloride 10 mL/hr at 04/11/18 0700  . ampicillin (OMNIPEN) IV Stopped (04/11/18 0126)  . dexmedetomidine (PRECEDEX) IV infusion 0.4 mcg/kg/hr (04/11/18 0700)  . meropenem (MERREM) IV Stopped (04/10/18 2132)  . norepinephrine (LEVOPHED) Adult infusion Stopped (04/10/18 0726)  . prismasol BGK 4/2.5 2,000 mL/hr at 04/11/18 0558    Scheduled Medications: . carBAMazepine  400 mg Oral TID  . darbepoetin (ARANESP) injection - NON-DIALYSIS  150 mcg Subcutaneous Q Mon-1800  . famotidine  20 mg Per Tube Daily  . gabapentin  100 mg Oral TID  . heparin  5,000 Units Subcutaneous Q8H  . insulin aspart  1-3 Units Subcutaneous Q4H  . lactulose  20 g Oral TID  . mouth rinse  15 mL Mouth Rinse BID  . phenobarbital  226.8 mg Oral QHS  . rifaximin  550 mg Per Tube BID    have reviewed scheduled and prn medications.  Physical Exam: General: extubated, slow- not sure of MS baseline Heart: RRR Lungs: mostly clear Abdomen: distended- non tender Extremities: dep edema Dialysis Access: left IJ vascath placed 3/15    04/11/2018,7:03 AM  LOS: 4 days

## 2018-04-11 NOTE — Evaluation (Signed)
Physical Therapy Evaluation Patient Details Name: Eddie Davies MRN: [id number] DOB: 01-18-1971 Today's Date: 04/11/2018   History of Present Illness  48 year old male patient who resides at skilled nursing facility due to cognitive delay.  Has underlying chronic liver disease, as well as chronic kidney disease stage IV.  Admitted 3/13 in [place] with altered sensorium and ammonia level of 195 as well as creatinine of 6.3.  Was admitted to the hospitalist service.  Further evaluate diagnostics revealed urinary tract infection with Klebsiella and enterococcus isolated.  He developed progressive neurological decline, worsening blood pressure/hypotension as well as worsening renal failure and metabolic acidosis and because of this he was transferred to [facility] for nephrology consultation and probable CRRT  Clinical Impression  Orders received for PT evaluation. Patient demonstrates deficits in functional mobility as indicated below. Will benefit from continued skilled PT to address deficits and maximize function. Will see as indicated and progress as tolerated.  At this time, patient requires significant physical assist for all aspects of mobility. Will need SNF.    Follow Up Recommendations SNF    Equipment Recommendations  (TBD)    Recommendations for Other Services       Precautions / Restrictions Precautions Precautions: Fall Restrictions Weight Bearing Restrictions: No      Mobility  Bed Mobility Overal bed mobility: Needs Assistance Bed Mobility: Rolling;Sidelying to Sit Rolling: Min assist Sidelying to sit: Mod assist;HOB elevated       General bed mobility comments: increased time and effort to perform, maximal cueing required.   Transfers Overall transfer level: Needs assistance Equipment used: 2 person hand held assist Transfers: Sit to/from [facility] Sit to Stand: Max assist;+2 physical assistance Stand pivot transfers: Max  assist;+2 physical assistance       General transfer comment: performed x 4 during session prior to transition to chair, LE blocking required with increased cues, time and effort. Rocking method used to create momentum for power up. Patient unable to reach full upright postureal alignment despite cues. Fatigues easily.(+2 person face to face technique)  Ambulation/Gait             General Gait Details: unable to perform  Stairs            Wheelchair Mobility    Modified Rankin (Stroke Patients Only)       Balance Overall balance assessment: Needs assistance   Sitting balance-Leahy Scale: Fair Sitting balance - Comments: able to tolerate EOB self supported     Standing balance-Leahy Scale: Zero                               Pertinent Vitals/Pain Pain Assessment: Faces Faces Pain Scale: Hurts a little bit Pain Location: g Pain Descriptors / Indicators: Grimacing Pain Intervention(s): Monitored during session    Home Living Family/patient expects to be discharged to:: Skilled nursing facility                      Prior Function           Comments: unknown at this time     Hand Dominance        Extremity/Trunk Assessment   Upper Extremity Assessment Upper Extremity Assessment: Generalized weakness    Lower Extremity Assessment Lower Extremity Assessment: Generalized weakness       Communication   Communication: Expressive difficulties  Cognition Arousal/Alertness: Awake/alert Behavior During Therapy: Flat affect Overall Cognitive Status:  Impaired/Different from baseline Area of Impairment: Orientation;Attention;Memory;Following commands;Safety/judgement;Awareness;Problem solving                 Orientation Level: Disoriented to;Time;Situation(was oriented to self and place with delayed response) Current Attention Level: Focused Memory: Decreased short-term memory Following Commands: Follows one step commands  inconsistently Safety/Judgement: Decreased awareness of safety;Decreased awareness of deficits Awareness: Intellectual Problem Solving: Slow processing;Decreased initiation;Difficulty sequencing;Requires verbal cues;Requires tactile cues General Comments: patient with a significant delay in cognitive processing during task performance, did response well during second session with verbalizing to audio songs during session      General Comments      Exercises     Assessment/Plan    PT Assessment Patient needs continued PT services  PT Problem List Decreased strength;Decreased activity tolerance;Decreased balance;Decreased mobility;Decreased coordination;Decreased cognition;Decreased safety awareness;Decreased knowledge of precautions;Pain;Cardiopulmonary status limiting activity;Obesity       PT Treatment Interventions DME instruction;Gait training;Functional mobility training;Therapeutic activities;Therapeutic exercise;Neuromuscular re-education;Balance training;Cognitive remediation;Patient/family education    PT Goals (Current goals can be found in the Care Plan section)  Acute Rehab PT Goals Patient Stated Goal: none stated PT Goal Formulation: Patient unable to participate in goal setting Time For Goal Achievement: 04/25/18 Potential to Achieve Goals: Good    Frequency Min 2X/week   Barriers to discharge Decreased caregiver support      Co-evaluation PT/OT/SLP Co-Evaluation/Treatment: Yes Reason for Co-Treatment: Complexity of the patient's impairments (multi-system involvement);Necessary to address cognition/behavior during functional activity;For patient/therapist safety;To address functional/ADL transfers PT goals addressed during session: Mobility/safety with mobility;Balance OT goals addressed during session: ADL's and self-care;Strengthening/ROM       AM-PAC PT "6 Clicks" Mobility  Outcome Measure Help needed turning from your back to your side while in a flat  bed without using bedrails?: A Lot Help needed moving from lying on your back to sitting on the side of a flat bed without using bedrails?: A Lot Help needed moving to and from a bed to a chair (including a wheelchair)?: A Lot Help needed standing up from a chair using your arms (e.g., wheelchair or bedside chair)?: Total Help needed to walk in hospital room?: Total Help needed climbing 3-5 steps with a railing? : Total 6 Click Score: 9    End of Session   Activity Tolerance: Patient tolerated treatment well;Patient limited by fatigue Patient left: in chair;with call bell/phone within reach;with chair alarm set Nurse Communication: Mobility status PT Visit Diagnosis: Difficulty in walking, not elsewhere classified (R26.2);Muscle weakness (generalized) (M62.81);Other symptoms and signs involving the nervous system (R29.898)    Time: 3536-1443 PT Time Calculation (min) (ACUTE ONLY): 24 min   Charges:   PT Evaluation $PT Eval Moderate Complexity: 1 Mod          Eddie Davies, PT DPT  Board Certified Neurologic Specialist Acute Rehabilitation Services Pager [phone] Office [phone]   Eddie Davies 04/11/2018, 11:22 AM

## 2018-04-11 NOTE — Progress Notes (Signed)
     04/11/18 1131  PT Visit Information  Last PT Received On 04/11/18  Assistance Needed +2  PT/OT/SLP Co-Evaluation/Treatment Yes  Reason for Co-Treatment Complexity of the patient's impairments (multi-system involvement)  PT goals addressed during session Mobility/safety with mobility  OT goals addressed during session ADL's and self-care  History of Present Illness 48 year old male patient who resides at skilled nursing facility due to cognitive delay.  Has underlying chronic liver disease, as well as chronic kidney disease stage IV.  Admitted 3/13 in [place] with altered sensorium and ammonia level of 195 as well as creatinine of 6.3.  Was admitted to the hospitalist service.  Further evaluate diagnostics revealed urinary tract infection with Klebsiella and enterococcus isolated.  He developed progressive neurological decline, worsening blood pressure/hypotension as well as worsening renal failure and metabolic acidosis and because of this he was transferred to [facility] for nephrology consultation and probable CRRT  Subjective Data  Subjective assist for return to bed  Precautions  Precautions Fall  Pain Assessment  Pain Assessment Faces  Faces Pain Scale 2  Pain Location g  Pain Descriptors / Indicators Grimacing  Pain Intervention(s) Monitored during session  Bed Mobility  Overal bed mobility Needs Assistance  Bed Mobility Sit to Supine  Rolling Min assist;Mod assist  Sit to supine Mod assist;+2 for physical assistance  General bed mobility comments assist to elevate LEs back to bed  Transfers  Overall transfer level Needs assistance  Equipment used 2 person hand held assist  Transfers Sit to/from Stand;Stand Pivot Transfers  Sit to Stand Max assist;+2 physical assistance  Stand pivot transfers Max assist;+2 physical assistance  General transfer comment  (+2 person face to face technique)  PT - End of Session  Patient left in bed;with call bell/phone within  reach;with bed alarm set  Nurse Communication Mobility status   PT - Assessment/Plan  PT Plan Current plan remains appropriate  PT Visit Diagnosis Difficulty in walking, not elsewhere classified (R26.2);Muscle weakness (generalized) (M62.81);Other symptoms and signs involving the nervous system (R29.898)  PT Frequency (ACUTE ONLY) Min 2X/week  Follow Up Recommendations SNF  PT equipment  (TBD)  AM-PAC PT "6 Clicks" Mobility Outcome Measure (Version 2)  Help needed turning from your back to your side while in a flat bed without using bedrails? 2  Help needed moving from lying on your back to sitting on the side of a flat bed without using bedrails? 2  Help needed moving to and from a bed to a chair (including a wheelchair)? 2  Help needed standing up from a chair using your arms (e.g., wheelchair or bedside chair)? 1  Help needed to walk in hospital room? 1  Help needed climbing 3-5 steps with a railing?  1  6 Click Score 9  Consider Recommendation of Discharge To: CIR/SNF/LTACH  Acute Rehab PT Goals  PT Goal Formulation Patient unable to participate in goal setting  Time For Goal Achievement 04/25/18  Potential to Achieve Goals Good  PT Time Calculation  PT Start Time (ACUTE ONLY) 1005  PT Stop Time (ACUTE ONLY) 1029  PT Time Calculation (min) (ACUTE ONLY) 24 min  PT General Charges  $$ ACUTE PT VISIT 1 Visit  PT Treatments  $Therapeutic Activity 8-22 mins    Alben Deeds, PT DPT  Board Certified Neurologic Specialist [place] Pager [phone] Office [phone]

## 2018-04-11 NOTE — Progress Notes (Signed)
Have found documentation from [facility] facility in chart. Have called, left contact info and expect return call from staff when they are available.

## 2018-04-11 NOTE — Progress Notes (Signed)
Pt tx to chair and back, pivoting with chair next to bed,  with OT/PT.  See their note.  Have reached out to [place], cousin NOK, to assess what facility pt at PTA and establish baseline mentation PTA; no word yet from him.

## 2018-04-11 NOTE — Progress Notes (Signed)
NAME:  Eddie Davies, MRN:  [id number], DOB:  [birth year], LOS: 4 ADMISSION DATE:  04/07/2018, CONSULTATION DATE:  3/14 REFERRING MD:  Graylon Good (from [facility]), CHIEF COMPLAINT: Altered mental status, and hepatic encephalopathy  Brief History   48 year old male patient who resides at skilled nursing facility due to cognitive delay.  Has underlying chronic liver disease, as well as chronic kidney disease stage IV.  Admitted 3/13 in [place] with altered sensorium and ammonia level of 195 as well as creatinine of 6.3.  Was admitted to the hospitalist service.  Further evaluate diagnostics revealed urinary tract infection with Klebsiella and enterococcus isolated.  He developed progressive neurological decline, worsening blood pressure/hypotension as well as worsening renal failure and metabolic acidosis and because of this he was transferred to [facility] for nephrology consultation and probable CRRT   Past Medical History  Cognitive delay, history of chronic liver disease, CKD stage IV, hypertension, seizure disorder, type 2 diabetes, history of medical noncompliance.  Significant Hospital Events   3/13 admitted to [facility] for acute renal failure, ammonia level of 195, and altered sensorium. 3/14: Renal failure worse, had risen from 6.3 up to 7.4.  Mental status continued to decline, became more hypotensive and was noted to have urinary tract infection.  Intubated for airway protection and transferred to [facility] 3/15 CRRT started 3/16 Left IJ HD catheter  Bleeding, off levo 04/10/2018 extubated  Consults:  Nephrology  Procedures:  ETT 3/14 >> 04/10/2018 Right subclavian triple-lumen catheter 3/14 >> LIJ HD cath 3/15 >>   Significant Diagnostic Tests:  EEG 3/15 neg Micro Data:  Urine culture 3/13 enterococcus as well as Klebsiella these were obtained at [facility] 04/08/2018 urine culture shows 70,000 colonies of yeast.  No need to treat at this time.  Antimicrobials:  Meropenem 3/14 >> ampi 3/15 >>  Interim history/subjective:   -5 L over 24 hours Successful liberated from mechanical ventilator support     Objective   Blood pressure 125/64, pulse 63, temperature 98 F (36.7 C), temperature source Oral, resp. rate 18, height 5' 10"  (1.778 m), weight (!) 148.7 kg, SpO2 99 %.    Vent Mode: PSV;CPAP FiO2 (%):  [30 %] 30 % PEEP:  [5 cmH20] 5 cmH20 Pressure Support:  [5 cmH20] 5 cmH20   Intake/Output Summary (Last 24 hours) at 04/11/2018 0754 Last data filed at 04/11/2018 0700 Gross per 24 hour  Intake 1445.45 ml  Output 6469 ml  Net -5023.55 ml   Filed Weights   04/09/18 0336 04/10/18 0500 04/11/18 0500  Weight: (!) 146.8 kg (!) 148.7 kg (!) 148.7 kg   Examination: General: Obese male who is able to answer questions follow but slowly HEENT: No JVD or lymphadenopathy is appreciated, pupils are equal reactive Neuro: Slow but able to communicate effectively CV: s1s2 rrr, no m/r/g PULM: even/non-labored, lungs bilaterally diminished throughout [place]:PXTG, non-tender, bsx4 active  Extremities: warm/dry, 2+ edema  Skin:  ichthyosis pigmentation noted over extremities  Resolved Hospital Problem list    Septic shock Assessment & Plan:  Septic shock-likely related to severe acidosis, source appears to be UTI -04/11/2027 on pressure support therefore resolved  Acute metabolic/hepatic encephalopathy.  He has underlying history of mental delay which is why he resides at skilled nursing facility.  Related to sepsis, uremia, initial ammonia level was high but decreased to 60 range Plan Continue lactulose and rifaximin Stop all other sedation    History of seizure Plan Continue to home medications carbamazepine phenobarbital and Neurontin  Acute respiratory failure: Secondary to ineffective airway protection Plan Extubated 04/10/2018 Weaning FiO2 Continues to improve speech is clear Mobilize as tolerated  Acute on chronic  renal failure, has baseline's CKD stage IV his last creatinine was measured at 3.9 from last discharge Renal ultrasound-no hydronephrosis -Now on CRRT, metabolic acidosis resolved, making urine Plan Currently off CRRT Appears volume overloaded Making urine Continue to evaluation by nephrology   Urinary tract infection, growing Klebsiella as well as enterococcus Plan Currently on day February of meropenem and ampicillin for suspected ESBL Urine culture shows 70,000 colonies of yeast no consideration to starting antifungal   History of chronic liver disease/presumed cirrhosis-LFTs normal Plan Continue lactulose and rifaximin  Anemia of chronic disease  Recent Labs    04/10/18 0229 04/11/18 0255  HGB 7.4* 7.2*    Appears as though baseline hemoglobin ranges from 7-8 Thrombocytopenia , INR ok Plan Transfuse per protocol Monitor for bleeding    Diabetes type 2 with hyperglycemia CBG (last 3)  Recent Labs    04/10/18 1913 04/10/18 2326 04/11/18 0340  GLUCAP 91 119* 127*    Plan Continue sliding scale insulin every 4 hours will need to transition to before meals and at bedtime when he begins taking more p.o.'s    Best practice:  Diet: TFs Pain/Anxiety/Delirium protocol (if indicated): 3/14 VAP protocol (if indicated): 3/14 DVT prophylaxis: Imlay heparin  GI prophylaxis: H2B Glucose control: ssi Mobility: Advance as as tolerated Code Status: full code  Family Communication: Communication with patient is difficult due to mental retardation. Disposition: Extubated 04/11/2018, awake and alert.  No acute distress hemodynamically stable off all pressors.  Consideration transfer to stepdown unit and to [facility] hospitalist service.  Richardson Landry Minor ACNP Maryanna Shape PCCM Pager [phone] till 1 pm If no answer page [phone] 04/11/2018, 7:54 AM

## 2018-04-11 NOTE — Progress Notes (Signed)
  Speech Language Pathology Treatment: Dysphagia  Patient Details Name: Eddie Davies MRN: [id number] DOB: [birth year] Today's Date: 04/11/2018 Time: 1207-1225 SLP Time Calculation (min) (ACUTE ONLY): 18 min  Assessment / Plan / Recommendation Clinical Impression  Pt reports feeling nauseous today (RN aware, okay with SLP trying POs), at first spitting instead of drinking from straw, but then agreeable only to ice chips. He has improved automaticity with bolus acceptance and initiation of mastication today, needing only Min cues and not swallowing them whole. He was then agreeable to a few sips of water and bites of puree, which were consumed without overt difficulty. Although pt does not want much, he seems appropriate for PO intake as able. Discussed with RN - could start up to Dys 1 diet and thin liquids, but unsure if clear liquid diet may be preferred due to nausea. Will f/u for advanced trials as able with good prognosis for diet advancement.   HPI HPI: Pt is a 48 yo male admitted from SNF with acute renal failure 2/2 sepsis felt to be related to UTI. ETT 3/14-3/17. PMH significant for cognitive delay, DM type 2, chronic liver disease, CKD stage 4, HTN, seizure disorder, morbid obesity.       SLP Plan  Continue with current plan of care       Recommendations  Diet recommendations: Dysphagia 1 (puree);Thin liquid Liquids provided via: Cup;Straw Medication Administration: Crushed with puree Supervision: Staff to assist with self feeding;Full supervision/cueing for compensatory strategies Compensations: Minimize environmental distractions;Slow rate;Small sips/bites Postural Changes and/or Swallow Maneuvers: Seated upright 90 degrees                Oral Care Recommendations: Oral care BID Follow up Recommendations: Skilled Nursing facility SLP Visit Diagnosis: Dysphagia, unspecified (R13.10) Plan: Continue with current plan of care       GO                Eddie Davies  Eddie Davies 04/11/2018, 12:31 PM  Eddie Davies, M.A. [place] Acute Environmental education officer [phone] Office [phone]

## 2018-04-11 NOTE — Progress Notes (Signed)
Pharmacy Antibiotic Note  Eddie Davies is a 48 y.o. male admitted on 04/07/2018 with sepsis.  Pharmacy has been consulted for Meropenem dosing and is also being treated with ampicillin.  Patient transferred from [facility] for possible dialysis.  Found to be septic from klebsiella ESBL and enterococcus UTI. Patient received CRRT from 3/15-3/18. Antibiotics have been dose adjusted for renal function. WBC WNL, pt afebrile.   Plan: Merropenem 1g IV q12hr Ampicillin 546m IV Q12hrs Monitor renal function and dialysis, C&S  Height: 5' 10"  (177.8 cm) Weight: (!) 327 lb 13.2 oz (148.7 kg) IBW/kg (Calculated) : 73  Temp (24hrs), Avg:97.6 F (36.4 C), Min:96.9 F (36.1 C), Max:98.2 F (36.8 C)  Recent Labs  Lab 04/07/18 1948 04/07/18 2200 04/08/18 0207 04/08/18 0827  04/09/18 0328 04/09/18 1613 04/10/18 0229 04/10/18 1644 04/11/18 0255  WBC 4.6  --  5.2 6.7  --  7.2  --  7.2  --  5.8  CREATININE 7.45*  --  7.47*  --    < > 4.91* 3.51* 2.71* 1.87* 1.64*  LATICACIDVEN 0.6 0.7  --   --   --   --   --   --   --   --    < > = values in this interval not displayed.    Estimated Creatinine Clearance: 81.4 mL/min (A) (by C-G formula based on SCr of 1.64 mg/dL (H)).    Allergies  Allergen Reactions  . Fish Oil Swelling and Other (See Comments)         Antimicrobials this admission: Merropenem 3/14 >>   Thank you for allowing pharmacy to be a part of this patient's care.  CAlanda Slim PharmD, FWest Norman EndoscopyClinical Pharmacist Please see AMION for all Pharmacists' Contact Phone Numbers 04/11/2018, 1:07 PM

## 2018-04-11 NOTE — Progress Notes (Signed)
Pt reports he could bathe and dress himself prior to admission, no caregiver available to confirm PLOF or facility from which he came. Pt presents with impaired cognition and very slow response speed, generalized weakness and zero ability to stand without +2 mod to max assist. He will need SNF for further rehab. Will follow acutely.  04/11/18 1200  OT Visit Information  Last OT Received On 04/11/18  Assistance Needed +2  PT/OT/SLP Co-Evaluation/Treatment Yes  Reason for Co-Treatment Complexity of the patient's impairments (multi-system involvement);For patient/therapist safety  OT goals addressed during session Strengthening/ROM  History of Present Illness 48 year old male patient who resides at skilled nursing facility due to cognitive delay.  Has underlying chronic liver disease, as well as chronic kidney disease stage IV.  Admitted 3/13 in [place] with altered sensorium and ammonia level of 195 as well as creatinine of 6.3.  Was admitted to the hospitalist service.  Further evaluate diagnostics revealed urinary tract infection with Klebsiella and enterococcus isolated.  He developed progressive neurological decline, worsening blood pressure/hypotension as well as worsening renal failure and metabolic acidosis and because of this he was transferred to [facility] for nephrology consultation and probable CRRT  Precautions  Precautions Fall  Precaution Comments flexiseal  Restrictions  Weight Bearing Restrictions No  Home Living  Family/patient expects to be discharged to: Skilled nursing facility  Prior Function  ADL's / [place] Pt reports he bathed and dressed himself   Communication  Communication Expressive difficulties  Pain Assessment  Pain Assessment Faces  Faces Pain Scale 8  Pain Location buttocks  Pain Descriptors / Indicators Grimacing  Pain Intervention(s) Monitored during session;Repositioned  Cognition  Arousal/Alertness Awake/alert  Behavior  During Therapy Flat affect  Overall Cognitive Status No family/caregiver present to determine baseline cognitive functioning  Area of Impairment Orientation;Attention;Memory;Following commands;Safety/judgement;Awareness;Problem solving  Orientation Level Disoriented to;Time;Situation  Current Attention Level Focused  Memory Decreased short-term memory  Following Commands Follows one step commands inconsistently (with multimodal cues)  Safety/Judgement Decreased awareness of safety;Decreased awareness of deficits  Awareness Intellectual  Problem Solving Slow processing;Decreased initiation;Difficulty sequencing;Requires verbal cues;Requires tactile cues  General Comments patient with a significant delay in cognitive processing during task performance, did response well during second session with verbalizing to audio songs during session  Upper Extremity Assessment  Upper Extremity Assessment Generalized weakness  Lower Extremity Assessment  Lower Extremity Assessment Defer to PT evaluation  Cervical / Trunk Assessment  Cervical / Trunk Assessment Other exceptions  Cervical / Trunk Exceptions obesity  ADL  Overall ADL's  Needs assistance/impaired  Grooming Wash/dry hands;Wash/dry face;Sitting;Set up  Upper Body Bathing Maximal assistance;Bed level  Lower Body Bathing Total assistance;Sit to/from stand;+2 for physical assistance  Upper Body Dressing  Moderate assistance;Bed level  Lower Body Dressing Total assistance;+2 for physical assistance;Sit to/from Retail buyer +2 for physical assistance;Moderate assistance;Stand-pivot  Toileting- Clothing Manipulation and Hygiene Total assistance;+2 for physical assistance;Sit to/from stand  Vision- Assessment  Additional Comments vision appears intact, wears glasses  Bed Mobility  Overal bed mobility Needs Assistance  Bed Mobility Rolling  Rolling Min assist  Sidelying to sit Mod assist;HOB elevated  General bed mobility comments  increased time and effort, verbal cues for maximum effort, assist to raise trunk  Transfers  Overall transfer level Needs assistance  Equipment used 2 person hand held assist  Transfers Sit to/from Stand;Stand Pivot Transfers  Sit to Stand +2 physical assistance;Mod assist  Stand pivot transfers +2 physical assistance;Mod assist  General transfer comment assist  from bed for pericare and transfer to chair  Balance  Overall balance assessment Needs assistance  Sitting balance-Leahy Scale Fair  Sitting balance - Comments able to tolerate EOB self supported  Standing balance-Leahy Scale Zero  OT - End of Session  Activity Tolerance Patient tolerated treatment well  Patient left in chair;with call bell/phone within reach;with chair alarm set;with nursing/sitter in room  Nurse Communication Mobility status  OT Assessment  OT Recommendation/Assessment Patient needs continued OT Services  OT Visit Diagnosis Unsteadiness on feet (R26.81);Pain;Muscle weakness (generalized) (M62.81);Other symptoms and signs involving cognitive function  OT Problem List Decreased strength;Decreased activity tolerance;Impaired balance (sitting and/or standing);Decreased cognition;Decreased safety awareness;Decreased knowledge of use of DME or AE;Obesity;Pain  OT Plan  OT Frequency (ACUTE ONLY) Min 2X/week  OT Treatment/Interventions (ACUTE ONLY) Self-care/ADL training;DME and/or AE instruction;Therapeutic activities;Patient/family education;Balance training  AM-PAC OT "6 Clicks" Daily Activity Outcome Measure (Version 2)  Help from another person eating meals? 3  Help from another person taking care of personal grooming? 3  Help from another person toileting, which includes using toliet, bedpan, or urinal? 1  Help from another person bathing (including washing, rinsing, drying)? 2  Help from another person to put on and taking off regular upper body clothing? 2  Help from another person to put on and taking off  regular lower body clothing? 1  6 Click Score 12  OT Recommendation  Follow Up Recommendations SNF;Supervision/Assistance - 24 hour  Individuals Consulted  Consulted and Agree with Results and Recommendations Patient unable/family or caregiver not available  Acute Rehab OT Goals  Patient Stated Goal none stated  OT Goal Formulation Patient unable to participate in goal setting  Time For Goal Achievement 04/25/18  Potential to Achieve Goals Good  OT Time Calculation  OT Start Time (ACUTE ONLY) 0930  OT Stop Time (ACUTE ONLY) 0953  OT Time Calculation (min) 23 min  OT General Charges  $OT Visit 1 Visit  OT Evaluation  $OT Eval Moderate Complexity 1 Mod  Written Expression  Dominant Hand Right  Nestor Lewandowsky, OTR/L Acute Rehabilitation Services Pager: [phone] Office: [phone]

## 2018-04-12 ENCOUNTER — Inpatient Hospital Stay (HOSPITAL_COMMUNITY): Payer: Medicare Other

## 2018-04-12 DIAGNOSIS — R7989 Other specified abnormal findings of blood chemistry: Secondary | ICD-10-CM

## 2018-04-12 DIAGNOSIS — N189 Chronic kidney disease, unspecified: Secondary | ICD-10-CM

## 2018-04-12 DIAGNOSIS — G9341 Metabolic encephalopathy: Secondary | ICD-10-CM

## 2018-04-12 DIAGNOSIS — N390 Urinary tract infection, site not specified: Secondary | ICD-10-CM

## 2018-04-12 DIAGNOSIS — N184 Chronic kidney disease, stage 4 (severe): Secondary | ICD-10-CM

## 2018-04-12 DIAGNOSIS — R5381 Other malaise: Secondary | ICD-10-CM

## 2018-04-12 DIAGNOSIS — D631 Anemia in chronic kidney disease: Secondary | ICD-10-CM

## 2018-04-12 DIAGNOSIS — K7469 Other cirrhosis of liver: Secondary | ICD-10-CM

## 2018-04-12 LAB — RENAL FUNCTION PANEL
ANION GAP: 14 (ref 5–15)
Albumin: 2.2 g/dL — ABNORMAL LOW (ref 3.5–5.0)
BUN: 12 mg/dL (ref 6–20)
CO2: 25 mmol/L (ref 22–32)
CREATININE: 1.95 mg/dL — AB (ref 0.61–1.24)
Calcium: 7.6 mg/dL — ABNORMAL LOW (ref 8.9–10.3)
Chloride: 102 mmol/L (ref 98–111)
GFR calc Af Amer: 46 mL/min — ABNORMAL LOW (ref >60)
GFR calc non Af Amer: 40 mL/min — ABNORMAL LOW (ref >60)
GLUCOSE: 222 mg/dL — AB (ref 70–99)
Phosphorus: 3.3 mg/dL (ref 2.5–4.6)
Potassium: 2.8 mmol/L — ABNORMAL LOW (ref 3.5–5.1)
SODIUM: 141 mmol/L (ref 135–145)

## 2018-04-12 LAB — CULTURE, BLOOD (ROUTINE X 2)
Culture: NO GROWTH
Culture: NO GROWTH
SPECIAL REQUESTS: ADEQUATE

## 2018-04-12 LAB — GLUCOSE, CAPILLARY
Glucose-Capillary: 204 mg/dL — ABNORMAL HIGH (ref 70–99)
Glucose-Capillary: 277 mg/dL — ABNORMAL HIGH (ref 70–99)
Glucose-Capillary: 314 mg/dL — ABNORMAL HIGH (ref 70–99)
Glucose-Capillary: 283 mg/dL — ABNORMAL HIGH (ref 70–99)

## 2018-04-12 LAB — MAGNESIUM: MAGNESIUM: 1.9 mg/dL (ref 1.7–2.4)

## 2018-04-12 MED ORDER — ENSURE ENLIVE PO LIQD
237.0000 mL | Freq: Two times a day (BID) | ORAL | Status: DC
  Administered 2018-04-12 – 2018-04-14 (×4): 237 mL via ORAL

## 2018-04-12 MED ORDER — SODIUM CHLORIDE 0.9 % IV SOLN
2.0000 g | Freq: Four times a day (QID) | INTRAVENOUS | Status: DC
  Administered 2018-04-12 – 2018-04-13 (×4): 2 g via INTRAVENOUS
  Filled 2018-04-12 (×8): qty 2000

## 2018-04-12 MED ORDER — SODIUM CHLORIDE 0.9 % IV SOLN
1.0000 g | Freq: Three times a day (TID) | INTRAVENOUS | Status: DC
  Administered 2018-04-12 – 2018-04-14 (×5): 1 g via INTRAVENOUS
  Filled 2018-04-12 (×8): qty 1

## 2018-04-12 MED ORDER — INSULIN GLARGINE 100 UNIT/ML ~~LOC~~ SOLN
5.0000 [IU] | Freq: Every day | SUBCUTANEOUS | Status: DC
  Administered 2018-04-12: 5 [IU] via SUBCUTANEOUS
  Filled 2018-04-12 (×3): qty 0.05

## 2018-04-12 MED ORDER — LACTULOSE 10 GM/15ML PO SOLN
20.0000 g | Freq: Two times a day (BID) | ORAL | Status: DC
  Administered 2018-04-12 – 2018-04-13 (×2): 20 g via ORAL
  Filled 2018-04-12 (×4): qty 30

## 2018-04-12 MED ORDER — HYDRALAZINE HCL 50 MG PO TABS
50.0000 mg | ORAL_TABLET | Freq: Three times a day (TID) | ORAL | Status: DC
  Administered 2018-04-12 – 2018-04-14 (×7): 50 mg via ORAL
  Filled 2018-04-12 (×8): qty 1

## 2018-04-12 MED ORDER — POTASSIUM CHLORIDE CRYS ER 20 MEQ PO TBCR
40.0000 meq | EXTENDED_RELEASE_TABLET | Freq: Two times a day (BID) | ORAL | Status: AC
  Administered 2018-04-12 (×2): 40 meq via ORAL
  Filled 2018-04-12 (×2): qty 2

## 2018-04-12 NOTE — Progress Notes (Signed)
Pharmacy Antibiotic Note  Eddie Davies is a 48 y.o. male admitted on 04/07/2018 with sepsis.  Pharmacy has been consulted for Meropenem dosing and is also being treated with ampicillin.  Patient transferred from [facility] for possible dialysis.  Found to be septic from klebsiella ESBL and enterococcus UTI. Patient received CRRT from 3/15-3/18. Antibiotics have been dose adjusted for renal function. WBC WNL, pt afebrile, SCR down to 1.95, good urine output.   Plan: Change Merropenem to 1g IV q8hr Change Ampicillin to 2g IV Q6hrs Monitor renal function and dialysis, C&S  Height: 5' 10"  (177.8 cm) Weight: (!) 327 lb 13.2 oz (148.7 kg) IBW/kg (Calculated) : 73  Temp (24hrs), Avg:98.2 F (36.8 C), Min:97.8 F (36.6 C), Max:98.7 F (37.1 C)  Recent Labs  Lab 04/07/18 1948 04/07/18 2200 04/08/18 0207 04/08/18 0827  04/09/18 0328 04/09/18 1613 04/10/18 0229 04/10/18 1644 04/11/18 0255 04/12/18 0520  WBC 4.6  --  5.2 6.7  --  7.2  --  7.2  --  5.8  --   CREATININE 7.45*  --  7.47*  --    < > 4.91* 3.51* 2.71* 1.87* 1.64* 1.95*  LATICACIDVEN 0.6 0.7  --   --   --   --   --   --   --   --   --    < > = values in this interval not displayed.    Estimated Creatinine Clearance: 68.4 mL/min (A) (by C-G formula based on SCr of 1.95 mg/dL (H)).    Allergies  Allergen Reactions  . Fish Oil Swelling and Other (See Comments)         Antimicrobials this admission: Merropenem 3/14 >>  Ampicillin 3/15>>  Thank you for allowing pharmacy to be a part of this patient's care.  Juanell Fairly, PharmD PGY1 Pharmacy Resident Phone [phone] 04/12/2018 11:01 AM

## 2018-04-12 NOTE — Progress Notes (Signed)
CSW was made aware that pt is from a facility. Pt appears to be from [facility] [phone]. CSW reached out to facility to gather mroe information on pt however CSW had to leave message for [employer]. CSW awaits call back at this time to gather collateral information on pt.      Eddie Davies, MSW, [place] [facility] Clinical Social Worker [phone]

## 2018-04-12 NOTE — Progress Notes (Signed)
Nutrition Follow-up  DOCUMENTATION CODES:   Morbid obesity  INTERVENTION:   Ensure Enlive po BID, each supplement provides 350 kcal and 20 grams of protein  NUTRITION DIAGNOSIS:   Inadequate oral intake related to inability to eat as evidenced by NPO status.  Ongoing   GOAL:   Patient will meet greater than or equal to 90% of their needs  Progressing  MONITOR:   PO intake, Supplement acceptance, Labs, I & O's  REASON FOR ASSESSMENT:   Consult, Ventilator Assessment of nutrition requirement/status, Enteral/tube feeding initiation and management  ASSESSMENT:   48 yo male admitted with acute renal failure 2/2 sepsis. PMH significant for cognitive delay, DM type 2, chronic liver disease, CKD stage 4, HTN, seizure disorder, morbid obesity. Resides in SNF.   Patient with improving mental status today. He is on a full liquid diet. RN hopes to advance diet today. He has been consuming 100% of meals.  Full liquids does not provide adequate kcal and protein for this patient.  Will add PO supplements. Labs and medications reviewed.    Diet Order:   Diet Order            Diet full liquid Room service appropriate? Yes; Fluid consistency: Thin  Diet effective now              EDUCATION NEEDS:   Not appropriate for education at this time  Skin:  Skin Assessment: Skin Integrity Issues: Skin Integrity Issues:: Other (Comment) Other: Nonpressure wound - coccyx; MASD - medial sacrum  Last BM:  3/19 (type 7)  Height:   Ht Readings from Last 1 Encounters:  04/07/18 5' 10"  (1.778 m)    Weight:   Wt Readings from Last 1 Encounters:  04/11/18 (!) 148.7 kg    Ideal Body Weight:  75.5 kg  BMI:  Body mass index is 47.04 kg/m.  Estimated Nutritional Needs:   Kcal:  2100-2300  Protein:  100-130 gm (2-2.5 g/kg IBW)  Fluid:  2.3 L    Molli Barrows, RD, LDN, [place] Pager [phone] After Hours Pager [phone]

## 2018-04-12 NOTE — Progress Notes (Signed)
  Speech Language Pathology Treatment: Dysphagia  Patient Details Name: Eddie Davies MRN: [id number] DOB: 1971-01-01 Today's Date: 04/12/2018 Time: 6270-3500 SLP Time Calculation (min) (ACUTE ONLY): 15 min  Assessment / Plan / Recommendation Clinical Impression  Pt much more alert, states he is hungry.  Provided with trials of regular solids, ongoing thin liquids.  Demonstrates adequate mastication, seemingly brisk swallow response, no s/s of aspiration.  Voice quality is clear before and after consumption of POs; no cough.  Recommend advancing diet to regular solids, thin liquids; meds whole in liquids.  No SLP f/u is needed.  D/W RN.   HPI HPI: Pt is a 48 yo male admitted from SNF with acute renal failure 2/2 sepsis felt to be related to UTI. ETT 3/14-3/17. PMH significant for cognitive delay, DM type 2, chronic liver disease, CKD stage 4, HTN, seizure disorder, morbid obesity.       SLP Plan  All goals met       Recommendations  Diet recommendations: Regular;Thin liquid Liquids provided via: Cup;Straw Medication Administration: Whole meds with liquid Supervision: Patient able to self feed Compensations: Minimize environmental distractions Postural Changes and/or Swallow Maneuvers: Seated upright 90 degrees                Oral Care Recommendations: Oral care BID Follow up Recommendations: Skilled Nursing facility SLP Visit Diagnosis: Dysphagia, unspecified (R13.10) Plan: All goals met       GO               Eddie Davies, [place] CCC/SLP Acute Rehabilitation Services Office number [phone] Pager [phone]  Eddie Davies 04/12/2018, 3:01 PM

## 2018-04-12 NOTE — Progress Notes (Addendum)
PROGRESS NOTE    Eddie Davies  PIR:[id number]  DOB: Mar 17, 1970  DOA: 04/07/2018 PCP: [facility]  Brief Narrative: 48 year old male with history of cognitive delay, seizure disorder, diabetes mellitus, GERD, eczema/ichthyosis, chronic kidney disease stage IV, chronic liver disease presented from [facility] assisted living facility to [facility] on March 13 with altered mental status and admitted/treated for Klebsiella/enterococcus UTI/AKI.  His ammonia level on admission was 195 and creatinine at 6.3.  Hospital course complicated by hypotension and worsening renal failure with metabolic acidosis.  His creatinine level peaked to 7.4, bicarb was 11.  He was receiving rifaximin and lactulose for elevated ammonia level and was also on bicarb drip prior to transfer here.  Admission labs showed blood glucose 330, hemoglobin 7.3.  He was intubated for airway protection, placed on central line and transferred to [facility] medical on March 14 for worsening renal function and possible dialysis.  Patient seen by nephrology during the hospital course here and underwent CRRT.  His creatinine is now stabilized and off CRRT.  Patient transferred out of ICU on March 18. 3/13 admitted to [facility] for acute renal failure, ammonia level of 195, and altered sensorium. 3/14: Renal failure worse, had risen from 6.3 up to 7.4.  Mental status continued to decline, became more hypotensive and was noted to have urinary tract infection.  Intubated for airway protection and transferred to [facility] 3/15 CRRT started 3/16 Left IJ HD catheter  Bleeding, off levo 04/10/2018 extubated 3/18: Transferred out of ICU Subjective: Patient awake alert oriented x3, saturating well on room air.  He states he is feeling better and asking if he could go home.  Has not been out of bed.  He has rectal tube in place with loose stools on lactulose. Objective: Vitals:   04/12/18 0900 04/12/18 1000  04/12/18 1100 04/12/18 1101  BP: (!) 175/86 (!) 169/91 (!) 160/74   Pulse: 77 73 (!) 56   Resp: (!) 23 18 16    Temp:    98.3 F (36.8 C)  TempSrc:    Oral  SpO2: 96% 96% 99%   Weight:      Height:        Intake/Output Summary (Last 24 hours) at 04/12/2018 1118 Last data filed at 04/12/2018 1100 Gross per 24 hour  Intake 947.58 ml  Output 7700 ml  Net -6752.42 ml   Filed Weights   04/09/18 0336 04/10/18 0500 04/11/18 0500  Weight: (!) 146.8 kg (!) 148.7 kg (!) 148.7 kg    Physical Examination:  General exam: Appears calm and comfortable  Respiratory system: Clear to auscultation. Respiratory effort normal. Cardiovascular system: S1 & S2 heard, RRR. No JVD, murmurs, rubs, gallops or clicks. No pedal edema. Gastrointestinal system: Abdomen is nondistended, soft and nontender. No organomegaly or masses felt. Normal bowel sounds heard. Central nervous system: Alert and oriented x3.  Chronic right-sided weakness from trauma as a child.  Extremities: Symmetric 4x 5 power. 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 Mood & affect appropriate.     Data Reviewed: I have personally reviewed following labs and imaging studies  CBC: Recent Labs  Lab 04/07/18 1948  04/08/18 0207  04/08/18 0827 04/08/18 1628 04/09/18 0313 04/09/18 0328 04/10/18 0229 04/11/18 0255  WBC 4.6  --  5.2  --  6.7  --   --  7.2 7.2 5.8  NEUTROABS 3.3  --   --   --   --   --   --   --   --   --  HGB 6.6*   < > 6.2*   < > 7.0* 8.5* 8.5* 7.3* 7.4* 7.2*  HCT 22.7*   < > 21.1*   < > 23.4* 25.0* 25.0* 23.4* 23.5* 23.5*  MCV 97.0  --  95.9  --  95.9  --   --  93.2 95.1 96.3  PLT PLATELET CLUMPS NOTED ON SMEAR, COUNT APPEARS DECREASED  --  PLATELET CLUMPS NOTED ON SMEAR, COUNT APPEARS DECREASED  --  96*  --   --  94* 109* 103*   < > = values in this interval not displayed.   Basic Metabolic Panel: Recent Labs  Lab  04/09/18 0328 04/09/18 1613 04/10/18 0229 04/10/18 1644 04/11/18 0255 04/12/18 0520  NA 138 139 136 142 140 141  K 3.0* 3.3* 3.2* 3.1* 3.7 2.8*  CL 105 103 102 105 103 102  CO2 24 25 25 27 30 25   GLUCOSE 147* 122* 242* 106* 136* 222*  BUN 34* 26* 20 16 11 12   CREATININE 4.91* 3.51* 2.71* 1.87* 1.64* 1.95*  CALCIUM 6.2* 6.7* 7.0* 7.4* 7.6* 7.6*  MG 2.0 2.2 2.3  --  2.1 1.9  PHOS 6.6* 4.7*  4.9* 4.0 3.2 2.8 3.3   GFR: Estimated Creatinine Clearance: 68.4 mL/min (A) (by C-G formula based on SCr of 1.95 mg/dL (H)). Liver Function Tests: Recent Labs  Lab 04/07/18 1948  04/09/18 1613 04/10/18 0229 04/10/18 1644 04/11/18 0255 04/12/18 0520  AST 8*  --   --   --   --   --   --   ALT 9  --   --   --   --   --   --   ALKPHOS 131*  --   --   --   --   --   --   BILITOT 0.6  --   --   --   --   --   --   PROT 5.7*  --   --   --   --   --   --   ALBUMIN 2.3*   < > 2.2* 2.3* 2.1* 2.1* 2.2*   < > = values in this interval not displayed.   Recent Labs  Lab 04/07/18 1948  LIPASE 17   Recent Labs  Lab 04/07/18 1948 04/08/18 0207  AMMONIA 50* 66*   Coagulation Profile: Recent Labs  Lab 04/09/18 0930  INR 1.3*   Cardiac Enzymes: No results for input(s): CKTOTAL, CKMB, CKMBINDEX, TROPONINI in the last 168 hours. BNP (last 3 results) No results for input(s): PROBNP in the last 8760 hours. HbA1C: No results for input(s): HGBA1C in the last 72 hours. CBG: Recent Labs  Lab 04/11/18 0714 04/11/18 1119 04/11/18 1532 04/11/18 2350 04/12/18 0705  GLUCAP 148* 156* 178* 189* 204*   Lipid Profile: No results for input(s): CHOL, HDL, LDLCALC, TRIG, CHOLHDL, LDLDIRECT in the last 72 hours. Thyroid Function Tests: No results for input(s): TSH, T4TOTAL, FREET4, T3FREE, THYROIDAB in the last 72 hours. Anemia Panel: No results for input(s): VITAMINB12, FOLATE, FERRITIN, TIBC, IRON, RETICCTPCT in the last 72 hours. Sepsis Labs: Recent Labs  Lab 04/07/18 1948 04/07/18 2200   PROCALCITON 0.26  --   LATICACIDVEN 0.6 0.7    Recent Results (from the past 240 hour(s))  MRSA PCR Screening     Status: None   Collection Time: 04/07/18  6:22 PM  Result Value Ref Range Status   MRSA by PCR NEGATIVE NEGATIVE Final    Comment:  The GeneXpert MRSA Assay (FDA approved for NASAL specimens only), is one component of a comprehensive MRSA colonization surveillance program. It is not intended to diagnose MRSA infection nor to guide or monitor treatment for MRSA infections. Performed at [facility], [place] [address], [place], [place] [address]   Culture, blood (routine x 2)     Status: None (Preliminary result)   Collection Time: 04/07/18  9:24 PM  Result Value Ref Range Status   Specimen Description BLOOD LEFT HAND  Final   Special Requests   Final    BOTTLES DRAWN AEROBIC AND ANAEROBIC Blood Culture adequate volume   Culture   Final    NO GROWTH 4 DAYS Performed at [facility], [place] [address], [place], [place] [address]    Report Status PENDING  Incomplete  Culture, blood (routine x 2)     Status: None (Preliminary result)   Collection Time: 04/07/18  9:24 PM  Result Value Ref Range Status   Specimen Description BLOOD RIGHT HAND  Final   Special Requests   Final    BOTTLES DRAWN AEROBIC ONLY Blood Culture results may not be optimal due to an inadequate volume of blood received in culture bottles   Culture   Final    NO GROWTH 4 DAYS Performed at [facility], [place] [address], [place], [place] [address]    Report Status PENDING  Incomplete  Urine culture     Status: Abnormal   Collection Time: 04/08/18  4:37 AM  Result Value Ref Range Status   Specimen Description URINE, CATHETERIZED  Final   Special Requests   Final    NONE Performed at [facility], [place] [address] [place], [place] [address]    Culture 70,000 COLONIES/mL YEAST (A)  Final   Report Status 04/09/2018 FINAL  Final      Radiology Studies: Dg Chest Port 1  View  Result Date: 04/12/2018 CLINICAL DATA:  Abnormal respirations. EXAM: PORTABLE CHEST 1 VIEW COMPARISON:  04/10/2018. FINDINGS: Interim extubation and removal of NG tube. Left IJ line noted with tip over superior vena cava. Cardiomegaly. Diffuse bilateral pulmonary infiltrates/edema. Improved aeration right upper lung. No pleural effusion or pneumothorax. IMPRESSION: 1. Interim extubation and removal of NG tube. Left IJ line stable position. 2.  Cardiomegaly, no interim change. 3. Diffuse bilateral pulmonary infiltrates/edema noted. Improved aeration right upper lung. Electronically Signed   By: Marcello Moores  Register   On: 04/12/2018 06:19        Scheduled Meds: . carbamazepine  400 mg Oral TID  . darbepoetin (ARANESP) injection - NON-DIALYSIS  150 mcg Subcutaneous Q Mon-1800  . famotidine  20 mg Oral Q24H  . gabapentin  200 mg Oral TID  . heparin  5,000 Units Subcutaneous Q8H  . hydrALAZINE  50 mg Oral TID  . insulin aspart  0-9 Units Subcutaneous TID WC  . lactulose  20 g Oral BID  . mouth rinse  15 mL Mouth Rinse BID  . phenobarbital  226.8 mg Oral QHS  . potassium chloride  40 mEq Oral BID WC  . rifaximin  550 mg Oral BID   Continuous Infusions: . sodium chloride Stopped (04/12/18 1038)  . ampicillin (OMNIPEN) IV    . meropenem (MERREM) IV      Assessment & Plan:    1.  Acute metabolic encephalopathy: Present on admission secondary to elevated ammonia level versus UTI.  He is awake alert oriented x3.  Will reduce lactulose dosage given  severe diarrhea and improved mental status.  2.  Enterococcus/Klebsiella UTI with sepsis/septic shock present on admission: Patient been on meropenem since admission and started on ampicillin on March 15.  Continue antibiotics.  Off pressors/IV fluids.  Repeat urine cultures (catheterized) on March 15 grew 70,000 colonies of yeast, no need to treat.  No leukocytosis.  3.  Elevated ammonia level secondary to hepatic failure versus renal failure.   BUN now improved to 11 and creatinine at 1.6.  Will repeat ammonia level (last checked on March 16 with results: 66)  4.  AKI on chronic kidney disease stage IV: Patient's baseline creatinine was apparently 3.9.  Patient seen by nephrology during the hospital course and underwent CRRT.   According to the nurse, patient has been diuresing excessively.?  Post ATN diuresis.Creatinine improved to 1.6.  Continue to monitor off CRRT.  Creatinine up at 1.95 today and potassium 2.8.  Will replace potassium.  Appreciate nephrology follow-up  5. Liver cirrhosis:?  Etiology.  Apparently did not have ammonia issues previously.  Will reduce lactulose dose, continue rifampin and monitor response.  Continue rectal tube  6. Anemia of chronic disease: Status post blood transfusion on March 15 and now started on Epogen by nephrology.  Hemoglobin today at 7.2.  Will transfuse less than 7  7.  Ichthyosis/severe eczema: Topical therapy.  Follow-up dermatology on discharge  8.  Diabetes mellitus: On sliding scale insulin here.  Blood glucose 140 to 200.  Resume Lantus at a lower dose  9.Cognitive delay/seizure disorder: Resume home medications   10.GERD: Famotidine   11.  Hypertension/hyperlipidemia: Norvasc and statins on hold.  Was hypotensive on presentation but now blood pressure going up.  Started on hydralazine by nephrology  12. Deconditioning: Patient apparently was issued wheelchair-for ambulation at the assisted living facility about 6 weeks prior to admission.  Has old injuries and some residual right-sided weakness.  PT/OT to follow-up.  Hopefully can discontinue rectal tube soon.    DVT prophylaxis: Heparin Code Status: Full code Family / Patient Communication: No family bedside.  Discussed with patient.  Cussed with bedside nurse who apparently communicated with outpatient facility. Disposition Plan: Back to assisted living facility/skilled nursing facility     LOS: 5 days    Time spent: 42  minutes    Guilford Shi, MD [facility] Pager [phone]  If 7PM-7AM, please contact night-coverage [email or web address] Password [email or web address] 04/12/2018, 11:18 AM

## 2018-04-12 NOTE — TOC Initial Note (Signed)
Transition of Care [facility]) - Initial/Assessment Note    Patient Details  Name: Eddie Davies MRN: [id number] Date of Birth: 1970/05/05  [facility] CM/SW Contact:    Eddie Davies, Eddie Davies Phone Number: 04/12/2018, 9:09 AM  Clinical Narrative:   CSW aware that pt is from [facility]. CSW spoke with Eddie Davies the social worker from the facility. Eddie Davies expressed that pt has been with their facility since October 2019. Eddie Davies reported that pt was being cared for at home by cousin Eddie Davies however once pt's need increased Eddie Davies was no longer bale to care for pt. CSW was advised that pt is at the facility long term and once pt is medically stable to discharge then pt can return to the facility as pt is a long term care resident.               Expected Discharge Plan: Skilled Nursing Facility(From [facility] ) Barriers to Discharge: Continued Medical Work up   Patient Goals and CMS Choice   CMS Medicare.gov Compare Post Acute Care list provided to:: Other (Comment Required)(pt from a facility. ) Choice offered to / list presented to : NA  Expected Discharge Plan and Services Expected Discharge Plan: Skilled Nursing Facility(From [facility] )   Post Acute Care Choice: [place] Living arrangements for the past 2 months: [place]                 DME Arranged: N/A DME Agency: NA HH Arranged: NA [place] Agency: NA  Prior Living Arrangements/Services Living arrangements for the past 2 months: [name] Lives with:: Facility Resident Patient language and need for interpreter reviewed:: Yes Do you feel safe going back to the place where you live?: Yes      Need for Family Participation in Patient Care: Yes (Comment) Care giver support system in place?: Yes (comment)   Criminal Activity/Legal Involvement Pertinent to Current Situation/Hospitalization: No - Comment as needed  Activities of Daily Living      Permission  Sought/Granted Permission sought to share information with : Family Supports Permission granted to share information with : Yes, Verbal Permission Granted  Share Information with NAME: Eddie Davies office [phone], cell [phone]  Permission granted to share info w AGENCY: Social worker from [employer] granted to share info w Relationship: Education officer, museum from facility   Permission granted to share info w Contact Information: Eddie Davies   Emotional Assessment Appearance:: Appears stated age Attitude/Demeanor/Rapport: Unable to Assess Affect (typically observed): Unable to Assess   Alcohol / Substance Use: Not Applicable Psych Involvement: No (comment)  Admission diagnosis:  [place] Patient Active Problem List   Diagnosis Date Noted  . Acute renal failure (ARF) ([place]) 04/07/2018   PCP:  [facility], [place]:  No Pharmacies Listed    Social Determinants of Health (SDOH) Interventions    Readmission Risk Interventions 30 Day Unplanned Readmission Risk Score     Admission (Current) from 04/07/2018 in [place] ICU  30 Day Unplanned Readmission Risk Score (%)  16 Filed at 04/12/2018 0801     This score is the patient's risk of an unplanned readmission within 30 days of being discharged (0 -100%). The score is based on dignosis, age, lab data, medications, orders, and past utilization.   Low:  0-14.9   Medium: 15-21.9   High: 22-29.9   Extreme: 30 and above  No flowsheet data found.

## 2018-04-12 NOTE — Plan of Care (Signed)
  Problem: Education: Goal: Knowledge of General Education information will improve Description Including pain rating scale, medication(s)/side effects and non-pharmacologic comfort measures Outcome: Progressing   Problem: Health Behavior/Discharge Planning: Goal: Ability to manage health-related needs will improve Outcome: Progressing   Problem: Clinical Measurements: Goal: Ability to maintain clinical measurements within normal limits will improve Outcome: Progressing   Problem: Clinical Measurements: Goal: Will remain free from infection Outcome: Progressing   Problem: Clinical Measurements: Goal: Cardiovascular complication will be avoided Outcome: Progressing

## 2018-04-12 NOTE — Progress Notes (Signed)
Subjective:  Hemodynamics better still, almost hypertensive, remains extubated, made another 5 liters of urine -  negative fluid balance  Objective Vital signs in last 24 hours: Vitals:   04/11/18 1700 04/11/18 1925 04/11/18 2344 04/12/18 0414  BP: (!) 186/91     Pulse: 78     Resp: 15     Temp:  98.5 F (36.9 C) 98.7 F (37.1 C) 97.9 F (36.6 C)  TempSrc:  Oral Oral Oral  SpO2: 92%     Weight:      Height:       Weight change:   Intake/Output Summary (Last 24 hours) at 04/12/2018 0644 Last data filed at 04/12/2018 0541 Gross per 24 hour  Intake 1222.67 ml  Output 7547 ml  Net -6324.33 ml    Assessment/ Plan: Pt is a 48 y.o. yo male cognitive delay/SNF, liver dz, sz d/o and also stage 4 CKD (reported baseline crt 3.9) pt who was admitted on 04/07/2018 with altered MS  Assessment/Plan: 1. Renal- A on CRF vs progression of CKD.  Required CRRT from 3/15 to 3/18 via vascath-- labs pending for today after CRRT stopped- is making great urine and reportedly has CKD at baseline so would expect crt to go up  2. HTN/vol-  dep edema , overall negative and still making a lot of urine- extubated.  No need for lasix right now 3. Anemia- req transfusion 3/15- have added ESA- no iron check as is septic-  4. [place]- awaiting new labs - suspect K and phos will inc without HD  5. ID- meropenam and amp- thought to have UTI - bld cx neg to date.  Urine showing yeast- consider antifungal, will defer to CCM/pharmacy  6. Liver dz- rifampin/lactulose - ammonia down   Louis Meckel    Labs: Basic Metabolic Panel: Recent Labs  Lab 04/10/18 0229 04/10/18 1644 04/11/18 0255  NA 136 142 140  K 3.2* 3.1* 3.7  CL 102 105 103  CO2 25 27 30   GLUCOSE 242* 106* 136*  BUN 20 16 11   CREATININE 2.71* 1.87* 1.64*  CALCIUM 7.0* 7.4* 7.6*  PHOS 4.0 3.2 2.8   Liver Function Tests: Recent Labs  Lab 04/07/18 1948  04/10/18 0229 04/10/18 1644 04/11/18 0255  AST 8*  --   --   --   --   ALT 9   --   --   --   --   ALKPHOS 131*  --   --   --   --   BILITOT 0.6  --   --   --   --   PROT 5.7*  --   --   --   --   ALBUMIN 2.3*   < > 2.3* 2.1* 2.1*   < > = values in this interval not displayed.   Recent Labs  Lab 04/07/18 1948  LIPASE 17   Recent Labs  Lab 04/07/18 1948 04/08/18 0207  AMMONIA 50* 66*   CBC: Recent Labs  Lab 04/07/18 1948  04/08/18 0207  04/08/18 0827  04/09/18 0328 04/10/18 0229 04/11/18 0255  WBC 4.6  --  5.2  --  6.7  --  7.2 7.2 5.8  NEUTROABS 3.3  --   --   --   --   --   --   --   --   HGB 6.6*   < > 6.2*   < > 7.0*   < > 7.3* 7.4* 7.2*  HCT 22.7*   < >  21.1*   < > 23.4*   < > 23.4* 23.5* 23.5*  MCV 97.0  --  95.9  --  95.9  --  93.2 95.1 96.3  PLT PLATELET CLUMPS NOTED ON SMEAR, COUNT APPEARS DECREASED  --  PLATELET CLUMPS NOTED ON SMEAR, COUNT APPEARS DECREASED  --  96*  --  94* 109* 103*   < > = values in this interval not displayed.   Cardiac Enzymes: No results for input(s): CKTOTAL, CKMB, CKMBINDEX, TROPONINI in the last 168 hours. CBG: Recent Labs  Lab 04/11/18 0340 04/11/18 0714 04/11/18 1119 04/11/18 1532 04/11/18 2350  GLUCAP 127* 148* 156* 178* 189*    Iron Studies: No results for input(s): IRON, TIBC, TRANSFERRIN, FERRITIN in the last 72 hours. Studies/Results: Dg Chest Port 1 View  Result Date: 04/12/2018 CLINICAL DATA:  Abnormal respirations. EXAM: PORTABLE CHEST 1 VIEW COMPARISON:  04/10/2018. FINDINGS: Interim extubation and removal of NG tube. Left IJ line noted with tip over superior vena cava. Cardiomegaly. Diffuse bilateral pulmonary infiltrates/edema. Improved aeration right upper lung. No pleural effusion or pneumothorax. IMPRESSION: 1. Interim extubation and removal of NG tube. Left IJ line stable position. 2.  Cardiomegaly, no interim change. 3. Diffuse bilateral pulmonary infiltrates/edema noted. Improved aeration right upper lung. Electronically Signed   By: Marcello Moores  Register   On: 04/12/2018 06:19    Medications: Infusions: . sodium chloride Stopped (04/11/18 1425)  . ampicillin (OMNIPEN) IV    . meropenem (MERREM) IV 1 g (04/11/18 2231)  . prismasol BGK 4/2.5 2,000 mL/hr at 04/11/18 0558    Scheduled Medications: . carbamazepine  400 mg Oral TID  . darbepoetin (ARANESP) injection - NON-DIALYSIS  150 mcg Subcutaneous Q Mon-1800  . famotidine  20 mg Oral Q24H  . gabapentin  200 mg Oral TID  . heparin  5,000 Units Subcutaneous Q8H  . insulin aspart  0-9 Units Subcutaneous TID WC  . lactulose  20 g Oral TID  . mouth rinse  15 mL Mouth Rinse BID  . phenobarbital  226.8 mg Oral QHS  . rifaximin  550 mg Oral BID    have reviewed scheduled and prn medications.  Physical Exam: General: more alert, begging to go home- slow speech but suspect baseline  Heart: RRR Lungs: mostly clear Abdomen: distended- non tender Extremities: dep edema- scaly skin throughout  Dialysis Access: left IJ vascath placed 3/15    04/12/2018,6:44 AM  LOS: 5 days

## 2018-04-13 DIAGNOSIS — I10 Essential (primary) hypertension: Secondary | ICD-10-CM

## 2018-04-13 LAB — HEMOGLOBIN AND HEMATOCRIT, BLOOD
HCT: 23.8 % — ABNORMAL LOW (ref 39.0–52.0)
Hemoglobin: 7.1 g/dL — ABNORMAL LOW (ref 13.0–17.0)

## 2018-04-13 LAB — HEPATIC FUNCTION PANEL
ALBUMIN: 2.3 g/dL — AB (ref 3.5–5.0)
ALT: 8 U/L (ref 0–44)
AST: 11 U/L — ABNORMAL LOW (ref 15–41)
Alkaline Phosphatase: 106 U/L (ref 38–126)
Bilirubin, Direct: <0.1 mg/dL (ref 0.0–0.2)
Total Bilirubin: 0.9 mg/dL (ref 0.3–1.2)
Total Protein: 5.7 g/dL — ABNORMAL LOW (ref 6.5–8.1)

## 2018-04-13 LAB — RENAL FUNCTION PANEL
Albumin: 2.3 g/dL — ABNORMAL LOW (ref 3.5–5.0)
Anion gap: 9 (ref 5–15)
BUN: 13 mg/dL (ref 6–20)
CO2: 29 mmol/L (ref 22–32)
Calcium: 7.4 mg/dL — ABNORMAL LOW (ref 8.9–10.3)
Chloride: 102 mmol/L (ref 98–111)
Creatinine, Ser: 1.91 mg/dL — ABNORMAL HIGH (ref 0.61–1.24)
GFR calc Af Amer: 47 mL/min — ABNORMAL LOW (ref >60)
GFR calc non Af Amer: 41 mL/min — ABNORMAL LOW (ref >60)
GLUCOSE: 318 mg/dL — AB (ref 70–99)
Phosphorus: 3.5 mg/dL (ref 2.5–4.6)
Potassium: 2.6 mmol/L — CL (ref 3.5–5.1)
Sodium: 140 mmol/L (ref 135–145)

## 2018-04-13 LAB — GLUCOSE, CAPILLARY
Glucose-Capillary: 296 mg/dL — ABNORMAL HIGH (ref 70–99)
Glucose-Capillary: 354 mg/dL — ABNORMAL HIGH (ref 70–99)
Glucose-Capillary: 323 mg/dL — ABNORMAL HIGH (ref 70–99)
Glucose-Capillary: 304 mg/dL — ABNORMAL HIGH (ref 70–99)

## 2018-04-13 LAB — MAGNESIUM: Magnesium: 1.7 mg/dL (ref 1.7–2.4)

## 2018-04-13 LAB — AMMONIA: Ammonia: 28 umol/L (ref 9–35)

## 2018-04-13 MED ORDER — AMLODIPINE BESYLATE 5 MG PO TABS
5.0000 mg | ORAL_TABLET | Freq: Every day | ORAL | Status: DC
  Administered 2018-04-13 – 2018-04-14 (×2): 5 mg via ORAL
  Filled 2018-04-13 (×2): qty 1

## 2018-04-13 MED ORDER — POTASSIUM CHLORIDE CRYS ER 20 MEQ PO TBCR
40.0000 meq | EXTENDED_RELEASE_TABLET | Freq: Two times a day (BID) | ORAL | Status: DC
  Administered 2018-04-13 – 2018-04-14 (×2): 40 meq via ORAL
  Filled 2018-04-13 (×2): qty 2

## 2018-04-13 MED ORDER — POTASSIUM CHLORIDE 10 MEQ/100ML IV SOLN
10.0000 meq | INTRAVENOUS | Status: AC
  Administered 2018-04-13 (×4): 10 meq via INTRAVENOUS
  Filled 2018-04-13 (×4): qty 100

## 2018-04-13 MED ORDER — TRIAMCINOLONE 0.1 % CREAM:EUCERIN CREAM 1:1
TOPICAL_CREAM | Freq: Two times a day (BID) | CUTANEOUS | Status: DC
  Administered 2018-04-13 – 2018-04-14 (×3): via TOPICAL
  Filled 2018-04-13 (×2): qty 1

## 2018-04-13 MED ORDER — SODIUM CHLORIDE 0.9% FLUSH: 10.0000 mL | INTRAVENOUS | Status: DC | PRN

## 2018-04-13 MED ORDER — POTASSIUM CHLORIDE CRYS ER 20 MEQ PO TBCR
40.0000 meq | EXTENDED_RELEASE_TABLET | Freq: Every day | ORAL | Status: DC
  Administered 2018-04-13: 40 meq via ORAL
  Filled 2018-04-13: qty 2

## 2018-04-13 MED ORDER — SIMVASTATIN 20 MG PO TABS
20.0000 mg | ORAL_TABLET | Freq: Every day | ORAL | Status: DC
  Administered 2018-04-13: 20 mg via ORAL
  Filled 2018-04-13: qty 1

## 2018-04-13 MED ORDER — TAMSULOSIN HCL 0.4 MG PO CAPS
0.4000 mg | ORAL_CAPSULE | Freq: Every day | ORAL | Status: DC
  Administered 2018-04-13 – 2018-04-14 (×2): 0.4 mg via ORAL
  Filled 2018-04-13 (×2): qty 1

## 2018-04-13 MED ORDER — AMOXICILLIN 500 MG PO CAPS
500.0000 mg | ORAL_CAPSULE | Freq: Three times a day (TID) | ORAL | Status: DC
  Administered 2018-04-13 – 2018-04-14 (×3): 500 mg via ORAL
  Filled 2018-04-13 (×3): qty 1

## 2018-04-13 MED ORDER — MAGNESIUM SULFATE 2 GM/50ML IV SOLN
2.0000 g | Freq: Once | INTRAVENOUS | Status: AC
  Administered 2018-04-13: 2 g via INTRAVENOUS
  Filled 2018-04-13: qty 50

## 2018-04-13 MED ORDER — INSULIN GLARGINE 100 UNIT/ML ~~LOC~~ SOLN
10.0000 [IU] | Freq: Every day | SUBCUTANEOUS | Status: DC
  Administered 2018-04-13: 10 [IU] via SUBCUTANEOUS
  Filled 2018-04-13: qty 0.1

## 2018-04-13 NOTE — Progress Notes (Signed)
Pt found to have skin tear on medial posterior scrotum from where rectal pouch was placed. Area cleansed and foam placed. Will continue to monitor

## 2018-04-13 NOTE — Care Management Important Message (Signed)
Important Message  Patient Details  Name: Eddie Davies MRN: [id number] Date of Birth: [birth year]   Medicare Important Message Given:  Yes    Cheila Wickstrom Montine Circle 04/13/2018, 3:44 PM

## 2018-04-13 NOTE — Progress Notes (Deleted)
Pt found to have skin tear on medial posterior scrotum from where rectal pouch was placed. Area cleansed and foam placed. Will continue to monitor.

## 2018-04-13 NOTE — Progress Notes (Signed)
Subjective:  Hemodynamics better still, actually hypertensive -  made another 4.5 liters of urine -  negative fluid balance- crt stable   Objective Vital signs in last 24 hours: Vitals:   04/13/18 0002 04/13/18 0429 04/13/18 0841 04/13/18 1210  BP: (!) 183/84 105/83 (!) 182/92 (!) 159/82  Pulse: (!) 57 63 61 66  Resp: (!) 22 17 18 20   Temp: 98.9 F (37.2 C) 98.8 F (37.1 C) 98.7 F (37.1 C) 98.1 F (36.7 C)  TempSrc: Oral Oral Oral Oral  SpO2: 99% 100% 100% 100%  Weight:  132 kg    Height:       Weight change:   Intake/Output Summary (Last 24 hours) at 04/13/2018 1233 Last data filed at 04/13/2018 1213 Gross per 24 hour  Intake 739.98 ml  Output 5201 ml  Net -4461.02 ml    Assessment/ Plan: Pt is a 48 y.o. yo male cognitive delay/SNF, liver dz, sz d/o and also stage 4 CKD (reported baseline crt 3.9) pt who was admitted on 04/07/2018 with altered MS  Assessment/Plan: 1. Renal- A on CRF vs progression of CKD.  Required CRRT from 3/15 to 3/18 via vascath--crt when CRRT stopped was 1.6- has worsened to 1.9 but stayed stable, great UOP.  Reported baseline crt 3.9 but have not been able to confirm.  I think OK to remove vascath today and foley will be removed as well     2. HTN/vol-  dep edema , overall negative and still making a lot of urine.  No need for lasix right now.  Amlodipine and hydralazine started  3. Anemia- req transfusion 3/15- have added ESA- no iron check as is septic-  4. Elytes- hypokalemia- place on kdur 40 BID, also got 40 IV today  5. ID- meropenam and amox- thought to have UTI - bld cx neg to date.   6. Liver dz- rifampin/lactulose - ammonia down   Louis Meckel    Labs: Basic Metabolic Panel: Recent Labs  Lab 04/11/18 0255 04/12/18 0520 04/13/18 0454  NA 140 141 140  K 3.7 2.8* 2.6*  CL 103 102 102  CO2 30 25 29   GLUCOSE 136* 222* 318*  BUN 11 12 13   CREATININE 1.64* 1.95* 1.91*  CALCIUM 7.6* 7.6* 7.4*  PHOS 2.8 3.3 3.5   Liver  Function Tests: Recent Labs  Lab 04/07/18 1948  04/11/18 0255 04/12/18 0520 04/13/18 0454  AST 8*  --   --   --  11*  ALT 9  --   --   --  8  ALKPHOS 131*  --   --   --  106  BILITOT 0.6  --   --   --  0.9  PROT 5.7*  --   --   --  5.7*  ALBUMIN 2.3*   < > 2.1* 2.2* 2.3*  2.3*   < > = values in this interval not displayed.   Recent Labs  Lab 04/07/18 1948  LIPASE 17   Recent Labs  Lab 04/07/18 1948 04/08/18 0207 04/13/18 0454  AMMONIA 50* 66* 28   CBC: Recent Labs  Lab 04/07/18 1948  04/08/18 0207  04/08/18 0827  04/09/18 0328 04/10/18 0229 04/11/18 0255 04/13/18 0454  WBC 4.6  --  5.2  --  6.7  --  7.2 7.2 5.8  --   NEUTROABS 3.3  --   --   --   --   --   --   --   --   --  HGB 6.6*   < > 6.2*   < > 7.0*   < > 7.3* 7.4* 7.2* 7.1*  HCT 22.7*   < > 21.1*   < > 23.4*   < > 23.4* 23.5* 23.5* 23.8*  MCV 97.0  --  95.9  --  95.9  --  93.2 95.1 96.3  --   PLT PLATELET CLUMPS NOTED ON SMEAR, COUNT APPEARS DECREASED  --  PLATELET CLUMPS NOTED ON SMEAR, COUNT APPEARS DECREASED  --  96*  --  94* 109* 103*  --    < > = values in this interval not displayed.   Cardiac Enzymes: No results for input(s): CKTOTAL, CKMB, CKMBINDEX, TROPONINI in the last 168 hours. CBG: Recent Labs  Lab 04/12/18 1221 04/12/18 1716 04/12/18 2143 04/13/18 0746 04/13/18 1153  GLUCAP 277* 314* 283* 296* 354*    Iron Studies: No results for input(s): IRON, TIBC, TRANSFERRIN, FERRITIN in the last 72 hours. Studies/Results: Dg Chest Port 1 View  Result Date: 04/12/2018 CLINICAL DATA:  Abnormal respirations. EXAM: PORTABLE CHEST 1 VIEW COMPARISON:  04/10/2018. FINDINGS: Interim extubation and removal of NG tube. Left IJ line noted with tip over superior vena cava. Cardiomegaly. Diffuse bilateral pulmonary infiltrates/edema. Improved aeration right upper lung. No pleural effusion or pneumothorax. IMPRESSION: 1. Interim extubation and removal of NG tube. Left IJ line stable position. 2.   Cardiomegaly, no interim change. 3. Diffuse bilateral pulmonary infiltrates/edema noted. Improved aeration right upper lung. Electronically Signed   By: Marcello Moores  Register   On: 04/12/2018 06:19   Medications: Infusions: . sodium chloride 10 mL/hr at 04/12/18 1400  . meropenem (MERREM) IV 1 g (04/13/18 0554)  . potassium chloride 10 mEq (04/13/18 1219)    Scheduled Medications: . amLODipine  5 mg Oral Daily  . amoxicillin  500 mg Oral Q8H  . carbamazepine  400 mg Oral TID  . darbepoetin (ARANESP) injection - NON-DIALYSIS  150 mcg Subcutaneous Q Mon-1800  . famotidine  20 mg Oral Q24H  . feeding supplement (ENSURE ENLIVE)  237 mL Oral BID BM  . gabapentin  200 mg Oral TID  . heparin  5,000 Units Subcutaneous Q8H  . hydrALAZINE  50 mg Oral TID  . insulin aspart  0-9 Units Subcutaneous TID WC  . insulin glargine  10 Units Subcutaneous QHS  . lactulose  20 g Oral BID  . mouth rinse  15 mL Mouth Rinse BID  . phenobarbital  226.8 mg Oral QHS  . potassium chloride  40 mEq Oral Daily  . rifaximin  550 mg Oral BID  . simvastatin  20 mg Oral QHS  . tamsulosin  0.4 mg Oral QPC breakfast  . triamcinolone 0.1 % cream : eucerin   Topical BID    have reviewed scheduled and prn medications.  Physical Exam: General:  suspect baseline MS- glad that foley was removed  Heart: RRR Lungs: mostly clear Abdomen: distended- non tender Extremities: dep edema- scaly skin throughout  Dialysis Access: left IJ vascath placed 3/15- to be removed     04/13/2018,12:33 PM  LOS: 6 days

## 2018-04-13 NOTE — Progress Notes (Signed)
Inpatient Diabetes Program Recommendations  AACE/ADA: New Consensus Statement on Inpatient Glycemic Control (2015)  Target Ranges:  Prepandial:   less than 140 mg/dL      Peak postprandial:   less than 180 mg/dL (1-2 hours)      Critically ill patients:  140 - 180 mg/dL   Results for Eddie Davies, Eddie Davies (MRN [id number]) as of 04/13/2018 10:41  Ref. Range 04/12/2018 07:05 04/12/2018 12:21 04/12/2018 17:16 04/12/2018 21:43 04/13/2018 07:46  Glucose-Capillary Latest Ref Range: 70 - 99 mg/dL 204 (H) 277 (H) 314 (H) 283 (H) 296 (H)   Review of Glycemic Control  Diabetes history: DM 2 Outpatient Diabetes medications: Basaglar 15 units BID, Humalog 0-12 units tid Current orders for Inpatient glycemic control: Lantus 5 units, Novolog 0-9 units tid  A1c 8.3% on 3/14  Inpatient Diabetes Program Recommendations:     Glucose trends elevated. Please consider increasing Lantus to half of home dose, Lantus 15 units Daily.  Thanks,  Tama Headings RN, MSN, BC-ADM Inpatient Diabetes Coordinator Team Pager [phone] (8a-5p)

## 2018-04-13 NOTE — Progress Notes (Signed)
PROGRESS NOTE        PATIENT DETAILS Name: Eddie Davies Age: 48 y.o. Sex: male Date of Birth: 06-09-1970 Admit Date: 04/07/2018 Admitting Physician Kipp Brood, MD XNT:[id number], [facility]  Brief Narrative: Patient is a 48 y.o. male with history of cognitive delay, seizure disorder, DM-2, CKD stage IV, chronic liver disease-presented to [facility] with acute metabolic encephalopathy in the setting of hyperammonemia, acute renal failure and complicated UTI.  Hospital course was complicated by worsening respiratory failure requiring intubation, patient was subsequently transferred to [facility] and was managed in the intensive care unit, nephrology was consulted and patient underwent CRRT.  Upon clinical stability-transfer to the triad hospitalist service on 3/19.  See below for further details  Subjective: Lying comfortably in bed-asking when he can be discharged.  Denies any chest pain or shortness of breath.  Assessment/Plan: Acute metabolic encephalopathy: Resolved.  Etiology likely secondary to renal failure, septic shock, presumed hepatic encephalopathy.  AKI on CKD stage IV: AKI hemodynamically mediated in setting of septic shock.  Required CRRT-renal function improving-great urine output (4.5 L in the past 24 hours)-electrolytes stable.  Will discuss with nephrology to see if we can remove HD catheter.  This with nephrology-Dr. Alanda Amass will go ahead and remove HD catheter and Foley catheter today.    Hypokalemia: Replete and recheck  History of chronic liver disease-presumed cirrhosis: Continue lactulose and rifaximin.  Complicated UTI: Urine cultures from [facility] positive for ESBL positive Klebsiella and Enterococcus faecalis.  Continue meropenem-change ampicillin to amoxicillin.  We will plan on a total of 7 days of treatment-stop date of meropenem 3/21, stop date for will be 3/22.  Anemia: Secondary to acute  illness, CKD-no evidence of bleeding-due to shortness of blood products-patient being asymptomatic-we will only transfuse if less than 7.  Nephrology dosing Aranesp.  Seizure disorder: Continue phenobarbital and Tegretol  DM-2: CBGs on the high side-increase Lantus to 10 units, continue SSI-follow and optimize  Hypertension: Blood pressure uncontrolled-continue hydralazine-add amlodipine.  BPH: Continue Flomax  GERD: Continue PPI  Cognitive delay/deconditioning: Resident of SNF  DVT Prophylaxis: Prophylactic Heparin  Code Status: Full code   Family Communication: None at bedside  Disposition Plan: Remain inpatient-but will plan on  SNF on discharge  Antimicrobial agents: Anti-infectives (From admission, onward)   Start     Dose/Rate Route Frequency Ordered Stop   04/12/18 2200  meropenem (MERREM) 1 g in sodium chloride 0.9 % 100 mL IVPB     1 g 200 mL/hr over 30 Minutes Intravenous Every 8 hours 04/12/18 1047     04/12/18 1100  ampicillin (OMNIPEN) 2 g in sodium chloride 0.9 % 100 mL IVPB     2 g 300 mL/hr over 20 Minutes Intravenous Every 6 hours 04/12/18 1047     04/12/18 1000  ampicillin (OMNIPEN) 500 mg in sodium chloride 0.9 % 50 mL IVPB  Status:  Discontinued     500 mg 150 mL/hr over 20 Minutes Intravenous Every 12 hours 04/11/18 1153 04/12/18 1047   04/11/18 2200  rifaximin (XIFAXAN) tablet 550 mg     550 mg Oral 2 times daily 04/11/18 1413     04/08/18 1900  meropenem (MERREM) 500 mg in sodium chloride 0.9 % 100 mL IVPB  Status:  Discontinued     500 mg 200 mL/hr over 30 Minutes Intravenous Every 24 hours  04/07/18 1928 04/08/18 0911   04/08/18 1000  meropenem (MERREM) 1 g in sodium chloride 0.9 % 100 mL IVPB  Status:  Discontinued     1 g 200 mL/hr over 30 Minutes Intravenous Every 12 hours 04/08/18 0911 04/12/18 1047   04/08/18 1000  ampicillin (OMNIPEN) 1 g in sodium chloride 0.9 % 100 mL IVPB  Status:  Discontinued     1 g 300 mL/hr over 20 Minutes  Intravenous Every 8 hours 04/08/18 0959 04/11/18 1153   04/07/18 2200  rifaximin (XIFAXAN) tablet 550 mg  Status:  Discontinued     550 mg Per Tube 2 times daily 04/07/18 1858 04/11/18 1413   04/07/18 1930  meropenem (MERREM) 1 g in sodium chloride 0.9 % 100 mL IVPB     1 g 200 mL/hr over 30 Minutes Intravenous  Once 04/07/18 1920 04/07/18 2038      Procedures: ETT 3/14 >> 04/10/2018 Right subclavian triple-lumen catheter 3/14 >> [facility] HD cath 3/15 >>  CONSULTS:  pulmonary/intensive care and nephrology  Time spent: 25- minutes-Greater than 50% of this time was spent in counseling, explanation of diagnosis, planning of further management, and coordination of care.  MEDICATIONS: Scheduled Meds:  carbamazepine  400 mg Oral TID   darbepoetin (ARANESP) injection - NON-DIALYSIS  150 mcg Subcutaneous Q Mon-1800   famotidine  20 mg Oral Q24H   feeding supplement (ENSURE ENLIVE)  237 mL Oral BID BM   gabapentin  200 mg Oral TID   heparin  5,000 Units Subcutaneous Q8H   hydrALAZINE  50 mg Oral TID   insulin aspart  0-9 Units Subcutaneous TID WC   insulin glargine  5 Units Subcutaneous QHS   lactulose  20 g Oral BID   mouth rinse  15 mL Mouth Rinse BID   phenobarbital  226.8 mg Oral QHS   potassium chloride  40 mEq Oral Daily   rifaximin  550 mg Oral BID   tamsulosin  0.4 mg Oral QPC breakfast   triamcinolone 0.1 % cream : eucerin   Topical BID   Continuous Infusions:  sodium chloride 10 mL/hr at 04/12/18 1400   ampicillin (OMNIPEN) IV 2 g (04/13/18 0553)   meropenem (MERREM) IV 1 g (04/13/18 0554)   potassium chloride 10 mEq (04/13/18 1055)   PRN Meds:.heparin, ondansetron (ZOFRAN) IV, sodium chloride flush   PHYSICAL EXAM: Vital signs: Vitals:   04/12/18 2200 04/13/18 0002 04/13/18 0429 04/13/18 0841  BP: (!) 184/85 (!) 183/84 105/83 (!) 182/92  Pulse: (!) 56 (!) 57 63 61  Resp: 17 (!) 22 17 18   Temp: 99.1 F (37.3 C) 98.9 F (37.2 C) 98.8 F (37.1 C)  98.7 F (37.1 C)  TempSrc:  Oral Oral Oral  SpO2: 99% 99% 100% 100%  Weight:   132 kg   Height:       Filed Weights   04/10/18 0500 04/11/18 0500 04/13/18 0429  Weight: (!) 148.7 kg (!) 148.7 kg 132 kg   Body mass index is 41.76 kg/m.   General appearance :Awake, alert, not in any distress.  HEENT: Atraumatic and Normocephalic Resp:Good air entry bilaterally, no added sounds  CVS: S1 S2 regular  GI: Bowel sounds present, Non tender and not distended with no gaurding, rigidity or rebound.No organomegaly Extremities: B/L Lower Ext shows no edema, both legs are warm to touch Neurology: Non focal Musculoskeletal:No digital cyanosis Skin:No Rash, warm and dry Wounds:N/A  I have personally reviewed following labs and imaging studies  LABORATORY DATA: CBC: Recent  Labs  Lab 04/07/18 1948  04/08/18 0207  04/08/18 0827  04/09/18 0313 04/09/18 0328 04/10/18 0229 04/11/18 0255 04/13/18 0454  WBC 4.6  --  5.2  --  6.7  --   --  7.2 7.2 5.8  --   NEUTROABS 3.3  --   --   --   --   --   --   --   --   --   --   HGB 6.6*   < > 6.2*   < > 7.0*   < > 8.5* 7.3* 7.4* 7.2* 7.1*  HCT 22.7*   < > 21.1*   < > 23.4*   < > 25.0* 23.4* 23.5* 23.5* 23.8*  MCV 97.0  --  95.9  --  95.9  --   --  93.2 95.1 96.3  --   PLT PLATELET CLUMPS NOTED ON SMEAR, COUNT APPEARS DECREASED  --  PLATELET CLUMPS NOTED ON SMEAR, COUNT APPEARS DECREASED  --  96*  --   --  94* 109* 103*  --    < > = values in this interval not displayed.    Basic Metabolic Panel: Recent Labs  Lab 04/09/18 1613 04/10/18 0229 04/10/18 1644 04/11/18 0255 04/12/18 0520 04/13/18 0454  NA 139 136 142 140 141 140  K 3.3* 3.2* 3.1* 3.7 2.8* 2.6*  CL 103 102 105 103 102 102  CO2 25 25 27 30 25 29   GLUCOSE 122* 242* 106* 136* 222* 318*  BUN 26* 20 16 11 12 13   CREATININE 3.51* 2.71* 1.87* 1.64* 1.95* 1.91*  CALCIUM 6.7* 7.0* 7.4* 7.6* 7.6* 7.4*  MG 2.2 2.3  --  2.1 1.9 1.7  PHOS 4.7*   4.9* 4.0 3.2 2.8 3.3 3.5     GFR: Estimated Creatinine Clearance: 65.3 mL/min (A) (by C-G formula based on SCr of 1.91 mg/dL (H)).  Liver Function Tests: Recent Labs  Lab 04/07/18 1948  04/10/18 0229 04/10/18 1644 04/11/18 0255 04/12/18 0520 04/13/18 0454  AST 8*  --   --   --   --   --  11*  ALT 9  --   --   --   --   --  8  ALKPHOS 131*  --   --   --   --   --  106  BILITOT 0.6  --   --   --   --   --  0.9  PROT 5.7*  --   --   --   --   --  5.7*  ALBUMIN 2.3*   < > 2.3* 2.1* 2.1* 2.2* 2.3*   2.3*   < > = values in this interval not displayed.   Recent Labs  Lab 04/07/18 1948  LIPASE 17   Recent Labs  Lab 04/07/18 1948 04/08/18 0207 04/13/18 0454  AMMONIA 50* 66* 28    Coagulation Profile: Recent Labs  Lab 04/09/18 0930  INR 1.3*    Cardiac Enzymes: No results for input(s): CKTOTAL, CKMB, CKMBINDEX, TROPONINI in the last 168 hours.  BNP (last 3 results) No results for input(s): PROBNP in the last 8760 hours.  HbA1C: No results for input(s): HGBA1C in the last 72 hours.  CBG: Recent Labs  Lab 04/12/18 0705 04/12/18 1221 04/12/18 1716 04/12/18 2143 04/13/18 0746  GLUCAP 204* 277* 314* 283* 296*    Lipid Profile: No results for input(s): CHOL, HDL, LDLCALC, TRIG, CHOLHDL, LDLDIRECT in the last 72 hours.  Thyroid Function Tests: No results for  input(s): TSH, T4TOTAL, FREET4, T3FREE, THYROIDAB in the last 72 hours.  Anemia Panel: No results for input(s): VITAMINB12, FOLATE, FERRITIN, TIBC, IRON, RETICCTPCT in the last 72 hours.  Urine analysis:    Component Value Date/Time   COLORURINE AMBER (A) 04/08/2018 1306   APPEARANCEUR CLOUDY (A) 04/08/2018 1306   LABSPEC 1.018 04/08/2018 1306   PHURINE 5.0 04/08/2018 1306   GLUCOSEU NEGATIVE 04/08/2018 1306   HGBUR MODERATE (A) 04/08/2018 1306   BILIRUBINUR NEGATIVE 04/08/2018 1306   KETONESUR NEGATIVE 04/08/2018 1306   PROTEINUR 100 (A) 04/08/2018 1306   NITRITE NEGATIVE 04/08/2018 1306   LEUKOCYTESUR LARGE (A)  04/08/2018 1306    Sepsis Labs: Lactic Acid, Venous    Component Value Date/Time   LATICACIDVEN 0.7 04/07/2018 2200    MICROBIOLOGY: Recent Results (from the past 240 hour(s))  MRSA PCR Screening     Status: None   Collection Time: 04/07/18  6:22 PM  Result Value Ref Range Status   MRSA by PCR NEGATIVE NEGATIVE Final    Comment:        The GeneXpert MRSA Assay (FDA approved for NASAL specimens only), is one component of a comprehensive MRSA colonization surveillance program. It is not intended to diagnose MRSA infection nor to guide or monitor treatment for MRSA infections. Performed at [facility], [place] [address], [place], [place] [address]   Culture, blood (routine x 2)     Status: None   Collection Time: 04/07/18  9:24 PM  Result Value Ref Range Status   Specimen Description BLOOD LEFT HAND  Final   Special Requests   Final    BOTTLES DRAWN AEROBIC AND ANAEROBIC Blood Culture adequate volume   Culture   Final    NO GROWTH 5 DAYS Performed at [facility], [place] [address], [place], [place] [address]    Report Status 04/12/2018 FINAL  Final  Culture, blood (routine x 2)     Status: None   Collection Time: 04/07/18  9:24 PM  Result Value Ref Range Status   Specimen Description BLOOD RIGHT HAND  Final   Special Requests   Final    BOTTLES DRAWN AEROBIC ONLY Blood Culture results may not be optimal due to an inadequate volume of blood received in culture bottles   Culture   Final    NO GROWTH 5 DAYS Performed at [facility], [place] [address], [place], [place] [address]    Report Status 04/12/2018 FINAL  Final  Urine culture     Status: Abnormal   Collection Time: 04/08/18  4:37 AM  Result Value Ref Range Status   Specimen Description URINE, CATHETERIZED  Final   Special Requests   Final    NONE Performed at [facility], [place] [address], [place], [place] [address]    Culture 70,000 COLONIES/mL YEAST (A)  Final   Report Status 04/09/2018  FINAL  Final    RADIOLOGY STUDIES/RESULTS: Dg Chest 1 View  Result Date: 04/08/2018 CLINICAL DATA:  Hemodialysis catheter insertion. EXAM: CHEST  1 VIEW COMPARISON:  04/08/2018 FINDINGS: There is a right IJ central venous catheter in the mid SVC. The catheter tip is long lateral wall near the brachiocephalic SVC junction. The endotracheal tube is in good position at the mid tracheal level. There is an NG tube coursing down the esophagus and into the stomach. Stable cardiac enlargement and prominent mediastinum. Persistent pulmonary edema and pleural effusions. Slight improved lung aeration when compared to earlier film. IMPRESSION: 1.  Left IJ central venous catheter tip in the SVC near the brachiocephalic SVC junction. 2. Right subclavian central venous catheter is stable. 3. Stable ET and NG tubes. 4. Persistent cardiac enlargement, central vascular congestion and pulmonary edema but slight improved aeration since earlier film. 5. Small effusions. Electronically Signed   By: Marijo Sanes M.D.   On: 04/08/2018 13:11   US Renal  Result Date: 04/07/2018 CLINICAL DATA:  Acute renal failure. EXAM: RENAL / URINARY TRACT ULTRASOUND COMPLETE COMPARISON:  Yesterday. FINDINGS: Right Kidney: Renal measurements: 13.2 x 6.8 x 6 0 cm = volume: 281 mL . Echogenicity within normal limits. No mass or hydronephrosis visualized. Left Kidney: Renal measurements: 13.2 x 6 0 x 5.2 cm = volume: 215 mL. Echogenicity within normal limits. No mass or hydronephrosis visualized. Bladder: Not visualized with a Foley catheter in place. IMPRESSION: 1. Normal kidneys without hydronephrosis. 2. Nonvisualized urinary bladder with a Foley catheter in place. Electronically Signed   By: Claudie Revering M.D.   On: 04/07/2018 19:38   Dg Chest Port 1 View  Result Date: 04/12/2018 CLINICAL DATA:  Abnormal respirations. EXAM: PORTABLE CHEST 1 VIEW COMPARISON:  04/10/2018. FINDINGS: Interim extubation and removal of NG tube. Left IJ line noted  with tip over superior vena cava. Cardiomegaly. Diffuse bilateral pulmonary infiltrates/edema. Improved aeration right upper lung. No pleural effusion or pneumothorax. IMPRESSION: 1. Interim extubation and removal of NG tube. Left IJ line stable position. 2.  Cardiomegaly, no interim change. 3. Diffuse bilateral pulmonary infiltrates/edema noted. Improved aeration right upper lung. Electronically Signed   By: Marcello Moores  Register   On: 04/12/2018 06:19   Dg Chest Port 1 View  Result Date: 04/10/2018 CLINICAL DATA:  Hypoxia EXAM: PORTABLE CHEST 1 VIEW COMPARISON:  April 09, 2018 FINDINGS: Endotracheal tube tip is 7.5 cm above the carina. Central catheter tip is at the junction of the superior vena cava and left innominate vein. Nasogastric tube tip and side port are below the diaphragm. No pneumothorax. : There is patchy atelectasis in the left lower lobe and right upper lobe regions. There is no appreciable edema or consolidation. Heart is mildly enlarged with pulmonary vascularity normal. No adenopathy. No bone lesions. IMPRESSION: Tube and catheter positions as described without pneumothorax. Areas of patchy atelectasis bilaterally. No frank consolidation or edema. Stable cardiac enlargement. Electronically Signed   By: Lowella Grip III M.D.   On: 04/10/2018 07:16   Dg Chest Port 1 View  Result Date: 04/09/2018 CLINICAL DATA:  Shortness of breath EXAM: PORTABLE CHEST 1 VIEW COMPARISON:  04/08/2018 FINDINGS: Endotracheal tube tip is at the level of the clavicular heads. Left IJ central venous catheter tip is in the mid SVC. Orogastric tube tip is beyond the field of view. There is moderate cardiomegaly with mild pulmonary edema and small pleural effusions. Right subclavian approach central venous catheter is unchanged. IMPRESSION: Cardiomegaly, small pleural effusions and mild pulmonary edema. Unchanged support apparatus. Electronically Signed   By: Ulyses Jarred M.D.   On: 04/09/2018 06:18   Dg Chest  Port 1 View  Result Date: 04/08/2018 CLINICAL DATA:  Respiratory failure EXAM: PORTABLE CHEST 1 VIEW COMPARISON:  04/07/2018 FINDINGS: Support Apparatus: --Endotracheal tube: Tip at the level of the clavicular heads. --Enteric tube:Tip and sideport are below the field of view. --Catheter(s):Right subclavian vein approach central venous catheter tip is at the lower SVC --Other: None Unchanged cardiomegaly and mild pulmonary edema. Failing pleural effusions are also unchanged. IMPRESSION: Unchanged appearance of the chest with findings of congestive  heart failure. Unchanged support apparatus. Electronically Signed   By: Ulyses Jarred M.D.   On: 04/08/2018 05:17   Dg Chest Port 1 View  Result Date: 04/07/2018 CLINICAL DATA:  Acute respiratory failure. EXAM: PORTABLE CHEST 1 VIEW COMPARISON:  Earlier today. FINDINGS: Endotracheal tube in satisfactory position. Stable right central venous catheter. Nasogastric tube extending into the stomach. Progressive enlargement of the cardiac silhouette. Diffuse prominence of the pulmonary vasculature and interstitial markings with less bilateral airspace opacity with an improved inspiration. Unremarkable bones. IMPRESSION: Mildly progressive cardiomegaly with grossly stable changes of congestive heart failure. Electronically Signed   By: Claudie Revering M.D.   On: 04/07/2018 19:42     LOS: 6 days   Oren Binet, MD  [facility]  If 7PM-7AM, please contact night-coverage  Please page via [email or web address]  Go to [email or web address] and use [place]'s universal password to access. If you do not have the password, please contact the hospital operator.  Locate the [facility] provider you are looking for under [facility] and page to a number that you can be directly reached. If you still have difficulty reaching the provider, please page the [facility] (Director on Call) for the Hospitalists listed on amion for assistance.  04/13/2018, 11:13 AM

## 2018-04-13 NOTE — Progress Notes (Signed)
Occupational Therapy Treatment Patient Details Name: Eddie Davies MRN: [id number] DOB: [birth year] Today's Date: 04/13/2018    History of present illness 48 year old male patient who resides at skilled nursing facility due to cognitive delay.  Has underlying chronic liver disease, as well as chronic kidney disease stage IV.  Admitted 3/13 in [place] with altered sensorium and ammonia level of 195 as well as creatinine of 6.3.  Was admitted to the hospitalist service.  Further evaluate diagnostics revealed urinary tract infection with Klebsiella and enterococcus isolated.  He developed progressive neurological decline, worsening blood pressure/hypotension as well as worsening renal failure and metabolic acidosis and because of this he was transferred to [facility] for nephrology consultation and probable CRRT   OT comments  This 48 yo male admitted with above making progress today with bed mobility, sit<>stand, and side stepping up towards [facility] all precursors to functional ADLs. He will benefit from acute OT with follow up OT at SNF.   Follow Up Recommendations  SNF;Supervision/Assistance - 24 hour          Precautions / Restrictions Precautions Precautions: Fall Precaution Comments: tear on sarum and scrotum placed foam patches Restrictions Weight Bearing Restrictions: No       Mobility Bed Mobility Overal bed mobility: Needs Assistance Bed Mobility: Supine to Sit Rolling: Mod assist;+2 for physical assistance   Supine to sit: Mod assist;+2 for safety/equipment     General bed mobility comments: Pt rolling side to side to clean patient of bowel incontinence.  Pt required assistance to advance LEs to edge of bed and to elevate trunk into sitting.    Transfers Overall transfer level: Needs assistance Equipment used: Rolling walker (2 wheeled) Transfers: Sit to/from Stand Sit to Stand: Mod assist;+2 physical assistance         General transfer comment: Pt stood  from elevate bed x 2 trials, assistance to boost into standing, on second trial performed side stepping with poor foot clearance on R.     Balance Overall balance assessment: Needs assistance   Sitting balance-Leahy Scale: Fair       Standing balance-Leahy Scale: Zero                             ADL either performed or assessed with clinical judgement   ADL Overall ADL's : Needs assistance/impaired                         Toilet Transfer: +2 for physical assistance;Maximal assistance Toilet Transfer Details (indicate cue type and reason): side step up in bed   Toileting - Clothing Manipulation Details (indicate cue type and reason): able to maintain standing with Mod A+2             Vision Patient Visual Report: No change from baseline            Cognition Arousal/Alertness: Awake/alert Behavior During Therapy: Flat affect Overall Cognitive Status: No family/caregiver present to determine baseline cognitive functioning                                                     Pertinent Vitals/ Pain       Pain Assessment: No/denies pain     Prior Functioning/Environment  Frequency  Min 2X/week        Progress Toward Goals  OT Goals(current goals can now be found in the care plan section)  Progress towards OT goals: Progressing toward goals  Acute Rehab OT Goals Patient Stated Goal: to listen to country music  Plan Discharge plan remains appropriate    Co-evaluation    PT/OT/SLP Co-Evaluation/Treatment: Yes Reason for Co-Treatment: Complexity of the patient's impairments (multi-system involvement);Necessary to address cognition/behavior during functional activity;For patient/therapist safety PT goals addressed during session: Mobility/safety with mobility OT goals addressed during session: ADL's and self-care;Strengthening/ROM      AM-PAC OT "6 Clicks" Daily Activity     Outcome Measure    Help from another person eating meals?: A Little Help from another person taking care of personal grooming?: A Little Help from another person toileting, which includes using toliet, bedpan, or urinal?: Total Help from another person bathing (including washing, rinsing, drying)?: A Lot Help from another person to put on and taking off regular upper body clothing?: A Lot Help from another person to put on and taking off regular lower body clothing?: Total 6 Click Score: 12    End of Session Equipment Utilized During Treatment: Gait belt  OT Visit Diagnosis: Unsteadiness on feet (R26.81);Muscle weakness (generalized) (M62.81);Other symptoms and signs involving cognitive function   Activity Tolerance Patient tolerated treatment well   Patient Left in bed;with call bell/phone within reach;with bed alarm set   Nurse Communication (bleeding areas sacrum and scrotum)        Time: 4098-1191 OT Time Calculation (min): 38 min  Charges: OT General Charges $OT Visit: 1 Visit OT Treatments $Self Care/Home Management : 8-22 mins  [place] Circle, OTR/L Acute [employer] Pager [phone] Office [phone]      Almon Register 04/13/2018, 4:44 PM

## 2018-04-13 NOTE — NC FL2 (Signed)
[employer] LEVEL OF CARE SCREENING TOOL     IDENTIFICATION  Patient Name: Eddie Davies Birthdate: 12-Jun-1970 Sex: male Admission Date (Current Location): 04/07/2018  [facility] [place] and [place] Number:  [employer] and Address:  [facility] [place], [place] [address] [place], [place] [address]      Provider Number: [id number]  Attending Physician Name and Address:  [name], MD  Relative Name and Phone Number:       Current Level of Care: Hospital Recommended Level of Care: [place] Prior Approval Number:    Date Approved/Denied:   PASRR Number:    Discharge Plan: SNF    Current Diagnoses: Patient Active Problem List   Diagnosis Date Noted  . Acute renal failure (ARF) ([place]) 04/07/2018    Orientation RESPIRATION BLADDER Height & Weight     Self, Time, Situation, Place  O2(Nasal cannula 2L) Continent Weight: 291 lb 0.1 oz (132 kg) Height:  5' 10"  (177.8 cm)  BEHAVIORAL SYMPTOMS/MOOD NEUROLOGICAL BOWEL NUTRITION STATUS      Incontinent(Rectal tube) Diet(Please see DC Summary)  AMBULATORY STATUS COMMUNICATION OF NEEDS Skin     Verbally Other (Comment)(Non-pressure wound on coccyx)                       Personal Care Assistance Level of Assistance              Functional Limitations Info  Sight, Hearing, Speech Sight Info: Adequate Hearing Info: Adequate Speech Info: Adequate    SPECIAL CARE FACTORS FREQUENCY                       Contractures Contractures Info: Not present    Additional Factors Info  Code Status, Allergies, Insulin Sliding Scale Code Status Info: Full Allergies Info: Fish Oil   Insulin Sliding Scale Info: 3x daily with meals       Current Medications (04/13/2018):  This is the current hospital active medication list Current Facility-Administered Medications  Medication Dose Route Frequency Provider Last Rate Last Dose  . 0.9 %  sodium chloride infusion  250 mL  Intravenous Continuous Erick Colace, NP 10 mL/hr at 04/12/18 1400    . amLODipine (NORVASC) tablet 5 mg  5 mg Oral Daily [name], MD   5 mg at 04/13/18 1229  . amoxicillin (AMOXIL) capsule 500 mg  500 mg Oral Q8H [name], [name], MD   500 mg at 04/13/18 1229  . carbamazepine (TEGRETOL) tablet 400 mg  400 mg Oral TID Rush Farmer, MD   400 mg at 04/13/18 1059  . Darbepoetin Alfa (ARANESP) injection 150 mcg  150 mcg Subcutaneous Q Mon-1800 Corliss Parish, MD   150 mcg at 04/09/18 2106  . famotidine (PEPCID) tablet 20 mg  20 mg Oral Q24H Rush Farmer, MD   20 mg at 04/12/18 2209  . feeding supplement (ENSURE ENLIVE) (ENSURE ENLIVE) liquid 237 mL  237 mL Oral BID BM Guilford Shi, MD   237 mL at 04/13/18 1321  . gabapentin (NEURONTIN) capsule 200 mg  200 mg Oral TID Rush Farmer, MD   200 mg at 04/13/18 0846  . heparin injection 1,000-6,000 Units  1,000-6,000 Units CRRT PRN Edrick Oh, MD   2,800 Units at 04/11/18 0750  . heparin injection 5,000 Units  5,000 Units Subcutaneous Q8H Erick Colace, NP   5,000 Units at 04/13/18 1221  . hydrALAZINE (APRESOLINE) tablet  50 mg  50 mg Oral TID Rigoberto Noel, MD   50 mg at 04/13/18 0847  . insulin aspart (novoLOG) injection 0-9 Units  0-9 Units Subcutaneous TID WC Minor, Grace Bushy, NP   9 Units at 04/13/18 1219  . insulin glargine (LANTUS) injection 10 Units  10 Units Subcutaneous QHS [name], Shanker M, MD      . lactulose (CHRONULAC) 10 GM/15ML solution 20 g  20 g Oral BID Guilford Shi, MD   20 g at 04/13/18 0846  . MEDLINE mouth rinse  15 mL Mouth Rinse BID Rush Farmer, MD   15 mL at 04/13/18 1059  . meropenem (MERREM) 1 g in sodium chloride 0.9 % 100 mL IVPB  1 g Intravenous Q8H [name], Shanker M, MD 200 mL/hr at 04/13/18 1322 1 g at 04/13/18 1322  . ondansetron (ZOFRAN) injection 4 mg  4 mg Intravenous Q6H PRN Anders Simmonds, MD   4 mg at 04/11/18 1057  . PHENobarbital (LUMINAL) tablet 226.8 mg  226.8 mg  Oral QHS Rush Farmer, MD   226.8 mg at 04/12/18 2235  . potassium chloride SA (K-DUR,KLOR-CON) CR tablet 40 mEq  40 mEq Oral BID Corliss Parish, MD      . rifaximin Doreene Nest) tablet 550 mg  550 mg Oral BID Rush Farmer, MD   550 mg at 04/13/18 0847  . simvastatin (ZOCOR) tablet 20 mg  20 mg Oral QHS [name], Shanker M, MD      . sodium chloride flush (NS) 0.9 % injection 10-40 mL  10-40 mL Intracatheter PRN Guilford Shi, MD      . tamsulosin (FLOMAX) capsule 0.4 mg  0.4 mg Oral QPC breakfast [name], MD   0.4 mg at 04/13/18 0846  . triamcinolone 0.1 % cream : eucerin cream, 1:1   Topical BID Guilford Shi, MD         Discharge Medications: Please see discharge summary for a list of discharge medications.  Relevant Imaging Results:  Relevant Lab Results:   Additional Information SSN: [id number]  Benard Halsted, LCSW

## 2018-04-13 NOTE — Progress Notes (Addendum)
Physical Therapy Treatment Patient Details Name: Eddie Davies MRN: [id number] DOB: 01-Jul-1970 Today's Date: 04/13/2018    History of Present Illness 48 year old male patient who resides at skilled nursing facility due to cognitive delay.  Has underlying chronic liver disease, as well as chronic kidney disease stage IV.  Admitted 3/13 in [place] with altered sensorium and ammonia level of 195 as well as creatinine of 6.3.  Was admitted to the hospitalist service.  Further evaluate diagnostics revealed urinary tract infection with Klebsiella and enterococcus isolated.  He developed progressive neurological decline, worsening blood pressure/hypotension as well as worsening renal failure and metabolic acidosis and because of this he was transferred to [facility] for nephrology consultation and probable CRRT    PT Comments    Pt performed sit to stand progression to RW.  Country music played during session and patient responded well.   Continue to recommend SNF placement due to strength deficits.  Plan next session for progression to gait training.   Follow Up Recommendations  SNF     Equipment Recommendations  (TBD)    Recommendations for Other Services       Precautions / Restrictions Precautions Precautions: Fall Precaution Comments: tear on sarum and scrotum placed foam patches Restrictions Weight Bearing Restrictions: No    Mobility  Bed Mobility Overal bed mobility: Needs Assistance Bed Mobility: Supine to Sit Rolling: Mod assist;+2 for physical assistance   Supine to sit: Mod assist;+2 for safety/equipment     General bed mobility comments: Pt rolling side to side to clean patient of bowel incontinence.  Pt required assistance to advance LEs to edge of bed and to elevate trunk into sitting.    Transfers Overall transfer level: Needs assistance Equipment used: Rolling walker (2 wheeled) Transfers: Sit to/from Stand Sit to Stand: Mod assist;+2 physical  assistance         General transfer comment: Pt stood from elevate bed x 2 trials, assistance to boost into standing, on second trial performed side stepping with poor foot clearance on R.   Ambulation/Gait             General Gait Details: sidestepping edge of bed mod +2 with assistance for weight shifting, lack foot clearance on R side.     Stairs             Wheelchair Mobility    Modified Rankin (Stroke Patients Only)       Balance Overall balance assessment: Needs assistance   Sitting balance-Leahy Scale: Fair       Standing balance-Leahy Scale: Zero                              Cognition Arousal/Alertness: Awake/alert Behavior During Therapy: Flat affect Overall Cognitive Status: No family/caregiver present to determine baseline cognitive functioning                                        Exercises      General Comments        Pertinent Vitals/Pain Pain Assessment: No/denies pain    Home Living                      Prior Function            PT Goals (current goals can now be found in the care plan section)  Acute Rehab PT Goals Patient Stated Goal: to listen to country music Potential to Achieve Goals: Good Progress towards PT goals: Progressing toward goals    Frequency    Min 2X/week      PT Plan Current plan remains appropriate    Co-evaluation PT/OT/SLP Co-Evaluation/Treatment: Yes Reason for Co-Treatment: Complexity of the patient's impairments (multi-system involvement);Necessary to address cognition/behavior during functional activity;To address functional/ADL transfers PT goals addressed during session: Mobility/safety with mobility        AM-PAC PT "6 Clicks" Mobility   Outcome Measure  Help needed turning from your back to your side while in a flat bed without using bedrails?: A Lot Help needed moving from lying on your back to sitting on the side of a flat bed without  using bedrails?: A Lot Help needed moving to and from a bed to a chair (including a wheelchair)?: A Lot Help needed standing up from a chair using your arms (e.g., wheelchair or bedside chair)?: A Lot Help needed to walk in hospital room?: A Lot Help needed climbing 3-5 steps with a railing? : Total 6 Click Score: 11    End of Session Equipment Utilized During Treatment: Gait belt Activity Tolerance: Patient tolerated treatment well;Patient limited by fatigue Patient left: in bed;with call bell/phone within reach;with bed alarm set Nurse Communication: Mobility status PT Visit Diagnosis: Difficulty in walking, not elsewhere classified (R26.2);Muscle weakness (generalized) (M62.81);Other symptoms and signs involving the nervous system (R29.898)     Time: 6195-0932 PT Time Calculation (min) (ACUTE ONLY): 38 min  Charges:  $Therapeutic Activity: 8-22 mins                     Governor Rooks, PTA Acute Rehabilitation Services Pager [phone] Office [phone]     Teretha Chalupa [name] 04/13/2018, 4:20 PM

## 2018-04-13 NOTE — Progress Notes (Signed)
Patient potassium 2.6,  Notified physician.

## 2018-04-14 DIAGNOSIS — E1121 Type 2 diabetes mellitus with diabetic nephropathy: Secondary | ICD-10-CM

## 2018-04-14 LAB — RENAL FUNCTION PANEL
Albumin: 2.2 g/dL — ABNORMAL LOW (ref 3.5–5.0)
Anion gap: 9 (ref 5–15)
BUN: 18 mg/dL (ref 6–20)
CO2: 25 mmol/L (ref 22–32)
Calcium: 7.2 mg/dL — ABNORMAL LOW (ref 8.9–10.3)
Chloride: 106 mmol/L (ref 98–111)
Creatinine, Ser: 1.94 mg/dL — ABNORMAL HIGH (ref 0.61–1.24)
GFR calc Af Amer: 46 mL/min — ABNORMAL LOW (ref >60)
GFR calc non Af Amer: 40 mL/min — ABNORMAL LOW (ref >60)
Glucose, Bld: 341 mg/dL — ABNORMAL HIGH (ref 70–99)
Phosphorus: 4.4 mg/dL (ref 2.5–4.6)
Potassium: 3.4 mmol/L — ABNORMAL LOW (ref 3.5–5.1)
Sodium: 140 mmol/L (ref 135–145)

## 2018-04-14 LAB — CBC
HCT: 25.9 % — ABNORMAL LOW (ref 39.0–52.0)
Hemoglobin: 7.6 g/dL — ABNORMAL LOW (ref 13.0–17.0)
MCH: 28.6 pg (ref 26.0–34.0)
MCHC: 29.3 g/dL — ABNORMAL LOW (ref 30.0–36.0)
MCV: 97.4 fL (ref 80.0–100.0)
Platelets: 82 10*3/uL — ABNORMAL LOW (ref 150–400)
RBC: 2.66 MIL/uL — AB (ref 4.22–5.81)
RDW: 16 % — ABNORMAL HIGH (ref 11.5–15.5)
WBC: 3.4 10*3/uL — ABNORMAL LOW (ref 4.0–10.5)
nRBC: 0.6 % — ABNORMAL HIGH (ref 0.0–0.2)

## 2018-04-14 LAB — GLUCOSE, CAPILLARY
GLUCOSE-CAPILLARY: 307 mg/dL — AB (ref 70–99)
Glucose-Capillary: 330 mg/dL — ABNORMAL HIGH (ref 70–99)

## 2018-04-14 LAB — MAGNESIUM: Magnesium: 2 mg/dL (ref 1.7–2.4)

## 2018-04-14 MED ORDER — AMOXICILLIN 500 MG PO CAPS: 500.0000 mg | ORAL_CAPSULE | Freq: Three times a day (TID) | ORAL | 0 refills | Status: AC

## 2018-04-14 MED ORDER — INSULIN GLARGINE 100 UNIT/ML ~~LOC~~ SOLN
15.0000 [IU] | Freq: Two times a day (BID) | SUBCUTANEOUS | Status: DC
  Administered 2018-04-14: 15 [IU] via SUBCUTANEOUS
  Filled 2018-04-14: qty 0.15

## 2018-04-14 MED ORDER — LACTULOSE ENCEPHALOPATHY 10 GM/15ML PO SOLN: 20.0000 g | Freq: Every day | ORAL | 0 refills | Status: DC

## 2018-04-14 NOTE — Progress Notes (Signed)
Clinical Social Worker facilitated patient discharge including contacting patient family and facility to confirm patient discharge plans.  Clinical information faxed to facility and family agreeable with plan.  CSW arranged ambulance transport via [facility] to [facility].  RN to call for report prior to [phone]) room 300.  Clinical Social Worker will sign off for now as social work intervention is no longer needed. Please consult [place] again if new need arises.  Mahamad Zykeriah Mathia LCSW [phone]

## 2018-04-14 NOTE — Progress Notes (Signed)
Subjective:  Hemodynamics better still, actually hypertensive -  made another 1.5 liters of urine -  negative fluid balance- crt stable - going back to SNF today   Objective Vital signs in last 24 hours: Vitals:   04/14/18 0036 04/14/18 0457 04/14/18 0929 04/14/18 1221  BP: (!) 149/81 (!) 156/86 (!) 169/90 (!) 163/90  Pulse: (!) 58 (!) 55  62  Resp: 18 18  19   Temp: 98.5 F (36.9 C) 98.2 F (36.8 C)  97.8 F (36.6 C)  TempSrc: Oral Oral  Oral  SpO2: 100% 100%  100%  Weight:  128.3 kg    Height:       Weight change: -3.7 kg  Intake/Output Summary (Last 24 hours) at 04/14/2018 1242 Last data filed at 04/14/2018 1100 Gross per 24 hour  Intake 720 ml  Output 1350 ml  Net -630 ml    Assessment/ Plan: Pt is a 48 y.o. yo male cognitive delay/SNF, liver dz, sz d/o and also stage 4 CKD (reported baseline crt 3.9) pt who was admitted on 04/07/2018 with altered MS  Assessment/Plan: 1. Renal- A on CRF vs progression of CKD.  Required CRRT from 3/15 to 3/18 via vascath--crt when CRRT stopped was 1.6- has worsened to 1.9 but stayed stable, great UOP.  Reported baseline crt 3.9 but have not been able to confirm.  Kidney function has maintained off of RRT.  I agree stable from renal standpoint for discharge - does not need renal specific follow up  2. HTN/vol-  dep edema , overall negative and still making a lot of urine.  No need for lasix right now.  Amlodipine and hydralazine started  3. Anemia- req transfusion 3/15- have added ESA- does not need to continue as OP- no iron check as is septic-  4. Elytes- hypokalemia- been replacing  5. ID- meropenam and amox- thought to have UTI - bld cx neg to date. Have completed therapy    6. Liver dz- rifampin/lactulose - ammonia down   Louis Meckel    Labs: Basic Metabolic Panel: Recent Labs  Lab 04/12/18 0520 04/13/18 0454 04/14/18 0420  NA 141 140 140  K 2.8* 2.6* 3.4*  CL 102 102 106  CO2 25 29 25   GLUCOSE 222* 318* 341*  BUN 12  13 18   CREATININE 1.95* 1.91* 1.94*  CALCIUM 7.6* 7.4* 7.2*  PHOS 3.3 3.5 4.4   Liver Function Tests: Recent Labs  Lab 04/07/18 1948  04/12/18 0520 04/13/18 0454 04/14/18 0420  AST 8*  --   --  11*  --   ALT 9  --   --  8  --   ALKPHOS 131*  --   --  106  --   BILITOT 0.6  --   --  0.9  --   PROT 5.7*  --   --  5.7*  --   ALBUMIN 2.3*   < > 2.2* 2.3*  2.3* 2.2*   < > = values in this interval not displayed.   Recent Labs  Lab 04/07/18 1948  LIPASE 17   Recent Labs  Lab 04/07/18 1948 04/08/18 0207 04/13/18 0454  AMMONIA 50* 66* 28   CBC: Recent Labs  Lab 04/07/18 1948  04/08/18 0827  04/09/18 0328 04/10/18 0229 04/11/18 0255 04/13/18 0454 04/14/18 0420  WBC 4.6   < > 6.7  --  7.2 7.2 5.8  --  3.4*  NEUTROABS 3.3  --   --   --   --   --   --   --   --  HGB 6.6*   < > 7.0*   < > 7.3* 7.4* 7.2* 7.1* 7.6*  HCT 22.7*   < > 23.4*   < > 23.4* 23.5* 23.5* 23.8* 25.9*  MCV 97.0   < > 95.9  --  93.2 95.1 96.3  --  97.4  PLT PLATELET CLUMPS NOTED ON SMEAR, COUNT APPEARS DECREASED   < > 96*  --  94* 109* 103*  --  82*   < > = values in this interval not displayed.   Cardiac Enzymes: No results for input(s): CKTOTAL, CKMB, CKMBINDEX, TROPONINI in the last 168 hours. CBG: Recent Labs  Lab 04/13/18 1153 04/13/18 1643 04/13/18 2109 04/14/18 0755 04/14/18 1158  GLUCAP 354* 323* 304* 307* 330*    Iron Studies: No results for input(s): IRON, TIBC, TRANSFERRIN, FERRITIN in the last 72 hours. Studies/Results: No results found. Medications: Infusions: . sodium chloride 250 mL (04/13/18 1737)  . meropenem (MERREM) IV 1 g (04/14/18 0554)    Scheduled Medications: . amLODipine  5 mg Oral Daily  . amoxicillin  500 mg Oral Q8H  . carbamazepine  400 mg Oral TID  . darbepoetin (ARANESP) injection - NON-DIALYSIS  150 mcg Subcutaneous Q Mon-1800  . famotidine  20 mg Oral Q24H  . feeding supplement (ENSURE ENLIVE)  237 mL Oral BID BM  . gabapentin  200 mg Oral TID  .  heparin  5,000 Units Subcutaneous Q8H  . hydrALAZINE  50 mg Oral TID  . insulin aspart  0-9 Units Subcutaneous TID WC  . insulin glargine  15 Units Subcutaneous BID  . lactulose  20 g Oral BID  . mouth rinse  15 mL Mouth Rinse BID  . phenobarbital  226.8 mg Oral QHS  . potassium chloride  40 mEq Oral BID  . rifaximin  550 mg Oral BID  . simvastatin  20 mg Oral QHS  . tamsulosin  0.4 mg Oral QPC breakfast  . triamcinolone 0.1 % cream : eucerin   Topical BID    have reviewed scheduled and prn medications.  Physical Exam: General:  suspect baseline MS- glad that he is going back to SNF Heart: RRR Lungs: mostly clear Abdomen: distended- non tender Extremities: dep edema- scaly skin throughout  Dialysis Access: left IJ vascath placed 3/15- to be removed     04/14/2018,12:42 PM  LOS: 7 days

## 2018-04-14 NOTE — Progress Notes (Signed)
Patient is given peri care due to bowel incontinence, foam is change. He is in bed with no complains.

## 2018-04-14 NOTE — Progress Notes (Signed)
Discharge instructions given Tania ( house supervisor). All questions answered and concerns addressed.

## 2018-04-14 NOTE — Discharge Summary (Addendum)
PATIENT DETAILS Name: Eddie Davies Age: 48 y.o. Sex: male Date of Birth: 1970/04/03 MRN: [id number]. Admitting Physician: Kipp Brood, MD [id number], Kinder Date: 04/07/2018 Discharge date: 04/14/2018  Recommendations for Outpatient Follow-up:  1. Follow up with PCP in 1-2 weeks 2. Please obtain BMP/CBC in one week   Admitted From:  SNF  Disposition: SNF   Home Health: No  Equipment/Devices: None  Discharge Condition: Stable  CODE STATUS: FULL CODE  Diet recommendation:  Heart Healthy / Carb Modified / Regular / Dysphagia 1/2/3 with full aspiration precautions  Brief Summary: See H&P, Labs, Consult and Test reports for all details in brief, Patient is a 48 y.o. male with history of cognitive delay, seizure disorder, DM-2, CKD stage IV, chronic liver disease-presented to [facility] with acute metabolic encephalopathy in the setting of hyperammonemia, acute renal failure and complicated UTI.  Hospital course was complicated by worsening respiratory failure requiring intubation, patient was subsequently transferred to [facility] and was managed in the intensive care unit, nephrology was consulted and patient underwent CRRT.  Upon clinical stability-transfer to the triad hospitalist service on 3/19.  See below for further details  Brief Hospital Course: Acute metabolic encephalopathy: Resolved.  Etiology likely secondary to renal failure, septic shock, presumed hepatic encephalopathy.  AKI on CKD stage IV: AKI hemodynamically mediated in setting of septic shock.  Required CRRT-renal function improving-great urine output (4.5 L in the past 24 hours)-electrolytes stable.  Foley catheter, hemodialysis catheter removed on 3/20.  Making good urine.  Creatinine has down trended and is close to usual baseline of around 1.9.  Stable for discharge with close follow-up with nephrology in the outpatient setting. W   Hypokalemia:  Repleted, please recheck electrolytes in 1 week  History of chronic liver disease-presumed cirrhosis: Continue lactulose and rifaximin.  Septic shock secondary to complicated UTI: Sepsis pathophysiology has resolved. Urine cultures from [facility] positive for ESBL positive Klebsiella and Enterococcus faecalis.  Treated with meropenem and IV ampicillin which was changed to amoxicillin on 3/21.    Last date of meropenem and on 3/21-stop date for amoxicillin will be 3/22. Marland Kitchen  Anemia: Secondary to acute illness, CKD-no evidence of bleeding-due to shortness of blood products-patient being asymptomatic-we will only transfuse if less than 7.    Thankfully hemoglobin slowly improving-up to 7.6 on the day of discharge-please recheck electrolytes in 1 week.  Thrombocytopenia: Appears to be mild-could be secondary to sepsis-no bleeding-please recheck CBC in 1 week.  If thrombocytopenia persists, consider outpatient hematology follow-up.  Seizure disorder: Continue phenobarbital and Tegretol  DM-2: CBGs on the high side-increase Lantus to 10 units twice daily-continue SSI-follow and optimize at SNF.    Hypertension: Blood pressure controlled-continue hydralazine-add amlodipine.  BPH: Continue Flomax  GERD: Continue PPI  Cognitive delay/deconditioning: Resident of SNF  Procedures/Studies: ETT 3/14 >>04/10/2018 Right subclavian triple-lumen catheter 3/14 >> [facility] HD cath 3/15 >>  Discharge Diagnoses:  Active Problems:   Acute renal failure (ARF) [facility])   Discharge Instructions:  Activity:  As tolerated   Discharge Instructions    Diet - low sodium heart healthy   Complete by:  As directed    Diet Carb Modified   Complete by:  As directed    Increase activity slowly   Complete by:  As directed      Allergies as of 04/14/2018      Reactions   Fish Oil Swelling, Other (See Comments)         Medication List  TAKE these medications   acetaminophen 650 MG CR tablet Commonly  known as:  TYLENOL Take 650 mg by mouth every 8 (eight) hours as needed for pain.   amLODipine 5 MG tablet Commonly known as:  NORVASC Take 5 mg by mouth daily.   amoxicillin 500 MG capsule Commonly known as:  AMOXIL Take 1 capsule (500 mg total) by mouth every 8 (eight) hours for 1 day.   Basaglar KwikPen 100 UNIT/ML Sopn Inject 15 Units into the skin 2 (two) times daily.   carbamazepine 200 MG 12 hr tablet Commonly known as:  TEGRETOL XR Take 600 mg by mouth 2 (two) times daily.   gabapentin 100 MG capsule Commonly known as:  NEURONTIN Take 200 mg by mouth 3 (three) times daily.   HumaLOG KwikPen 100 UNIT/ML KwikPen Generic drug:  insulin lispro Inject 0-12 Units into the skin See admin instructions. Inject 0-12 units subcutaneously three times daily per sliding scale - CBG 0-150 0 units, 151-200 2 units, 201-250 4 units, 251-300 6 units, 301-350 8 units, 351-400 10 units, 401-450 12 units.   hydrALAZINE 50 MG tablet Commonly known as:  APRESOLINE Take 50 mg by mouth 3 (three) times daily.   lactulose (encephalopathy) 10 GM/15ML Soln Commonly known as:  CHRONULAC Take 30 mLs (20 g total) by mouth daily. What changed:  when to take this   magnesium oxide 400 MG tablet Commonly known as:  MAG-OX Take 400 mg by mouth daily.   omeprazole 40 MG capsule Commonly known as:  PRILOSEC Take 40 mg by mouth 2 (two) times daily. 6am and 9pm   ondansetron 4 MG disintegrating tablet Commonly known as:  ZOFRAN-ODT Take 4 mg by mouth every 6 (six) hours as needed for nausea or vomiting.   PHENobarbital 64.8 MG tablet Commonly known as:  LUMINAL Take 194.4 mg by mouth at bedtime. Take with a 32.4 mg tablet for a total dose of 226.8 mg   PHENobarbital 32.4 MG tablet Commonly known as:  LUMINAL Take 32.4 mg by mouth at bedtime. Take with #3  64.8 mg tablets for a total dose of 226.8 mg   rifaximin 550 MG Tabs tablet Commonly known as:  XIFAXAN Take 550 mg by mouth 2 (two) times  daily. For diarrhea   simvastatin 20 MG tablet Commonly known as:  ZOCOR Take 20 mg by mouth at bedtime.   tamsulosin 0.4 MG Caps capsule Commonly known as:  FLOMAX Take 0.4 mg by mouth at bedtime.   Vitamin D 50 MCG (2000 UT) tablet Take 2,000 Units by mouth daily.       Contact information for follow-up providers    Practice, [facility]. Schedule an appointment as soon as possible for a visit in 1 week(s).   Contact information: [place] [id number] [phone]            Contact information for after-discharge care    Destination    [facility] .   Service:  Skilled Nursing Contact information: [address] [place] [address] [phone]                 Allergies  Allergen Reactions   Fish Oil Swelling and Other (See Comments)         Consultations:   pulmonary/intensive care and nephrology   Other Procedures/Studies: Dg Chest 1 View  Result Date: 04/08/2018 CLINICAL DATA:  Hemodialysis catheter insertion. EXAM: CHEST  1 VIEW COMPARISON:  04/08/2018  FINDINGS: There is a right IJ central venous catheter in the mid SVC. The catheter tip is long lateral wall near the brachiocephalic SVC junction. The endotracheal tube is in good position at the mid tracheal level. There is an NG tube coursing down the esophagus and into the stomach. Stable cardiac enlargement and prominent mediastinum. Persistent pulmonary edema and pleural effusions. Slight improved lung aeration when compared to earlier film. IMPRESSION: 1. Left IJ central venous catheter tip in the SVC near the brachiocephalic SVC junction. 2. Right subclavian central venous catheter is stable. 3. Stable ET and NG tubes. 4. Persistent cardiac enlargement, central vascular congestion and pulmonary edema but slight improved aeration since earlier film. 5. Small effusions. Electronically Signed   By: Marijo Sanes M.D.   On:  04/08/2018 13:11   US Renal  Result Date: 04/07/2018 CLINICAL DATA:  Acute renal failure. EXAM: RENAL / URINARY TRACT ULTRASOUND COMPLETE COMPARISON:  Yesterday. FINDINGS: Right Kidney: Renal measurements: 13.2 x 6.8 x 6 0 cm = volume: 281 mL . Echogenicity within normal limits. No mass or hydronephrosis visualized. Left Kidney: Renal measurements: 13.2 x 6 0 x 5.2 cm = volume: 215 mL. Echogenicity within normal limits. No mass or hydronephrosis visualized. Bladder: Not visualized with a Foley catheter in place. IMPRESSION: 1. Normal kidneys without hydronephrosis. 2. Nonvisualized urinary bladder with a Foley catheter in place. Electronically Signed   By: Claudie Revering M.D.   On: 04/07/2018 19:38   Dg Chest Port 1 View  Result Date: 04/12/2018 CLINICAL DATA:  Abnormal respirations. EXAM: PORTABLE CHEST 1 VIEW COMPARISON:  04/10/2018. FINDINGS: Interim extubation and removal of NG tube. Left IJ line noted with tip over superior vena cava. Cardiomegaly. Diffuse bilateral pulmonary infiltrates/edema. Improved aeration right upper lung. No pleural effusion or pneumothorax. IMPRESSION: 1. Interim extubation and removal of NG tube. Left IJ line stable position. 2.  Cardiomegaly, no interim change. 3. Diffuse bilateral pulmonary infiltrates/edema noted. Improved aeration right upper lung. Electronically Signed   By: Marcello Moores  Register   On: 04/12/2018 06:19   Dg Chest Port 1 View  Result Date: 04/10/2018 CLINICAL DATA:  Hypoxia EXAM: PORTABLE CHEST 1 VIEW COMPARISON:  April 09, 2018 FINDINGS: Endotracheal tube tip is 7.5 cm above the carina. Central catheter tip is at the junction of the superior vena cava and left innominate vein. Nasogastric tube tip and side port are below the diaphragm. No pneumothorax. : There is patchy atelectasis in the left lower lobe and right upper lobe regions. There is no appreciable edema or consolidation. Heart is mildly enlarged with pulmonary vascularity normal. No adenopathy. No  bone lesions. IMPRESSION: Tube and catheter positions as described without pneumothorax. Areas of patchy atelectasis bilaterally. No frank consolidation or edema. Stable cardiac enlargement. Electronically Signed   By: Lowella Grip III M.D.   On: 04/10/2018 07:16   Dg Chest Port 1 View  Result Date: 04/09/2018 CLINICAL DATA:  Shortness of breath EXAM: PORTABLE CHEST 1 VIEW COMPARISON:  04/08/2018 FINDINGS: Endotracheal tube tip is at the level of the clavicular heads. Left IJ central venous catheter tip is in the mid SVC. Orogastric tube tip is beyond the field of view. There is moderate cardiomegaly with mild pulmonary edema and small pleural effusions. Right subclavian approach central venous catheter is unchanged. IMPRESSION: Cardiomegaly, small pleural effusions and mild pulmonary edema. Unchanged support apparatus. Electronically Signed   By: Ulyses Jarred M.D.   On: 04/09/2018 06:18   Dg Chest Port 1 View  Result Date: 04/08/2018  CLINICAL DATA:  Respiratory failure EXAM: PORTABLE CHEST 1 VIEW COMPARISON:  04/07/2018 FINDINGS: Support Apparatus: --Endotracheal tube: Tip at the level of the clavicular heads. --Enteric tube:Tip and sideport are below the field of view. --Catheter(s):Right subclavian vein approach central venous catheter tip is at the lower SVC --Other: None Unchanged cardiomegaly and mild pulmonary edema. Failing pleural effusions are also unchanged. IMPRESSION: Unchanged appearance of the chest with findings of congestive heart failure. Unchanged support apparatus. Electronically Signed   By: Ulyses Jarred M.D.   On: 04/08/2018 05:17   Dg Chest Port 1 View  Result Date: 04/07/2018 CLINICAL DATA:  Acute respiratory failure. EXAM: PORTABLE CHEST 1 VIEW COMPARISON:  Earlier today. FINDINGS: Endotracheal tube in satisfactory position. Stable right central venous catheter. Nasogastric tube extending into the stomach. Progressive enlargement of the cardiac silhouette. Diffuse  prominence of the pulmonary vasculature and interstitial markings with less bilateral airspace opacity with an improved inspiration. Unremarkable bones. IMPRESSION: Mildly progressive cardiomegaly with grossly stable changes of congestive heart failure. Electronically Signed   By: Claudie Revering M.D.   On: 04/07/2018 19:42      TODAY-DAY OF DISCHARGE:  Subjective:   Eddie Davies today has no headache,no chest abdominal pain,no new weakness tingling or numbness, feels much better wants to go home today.   Objective:   Blood pressure (!) 169/90, pulse (!) 55, temperature 98.2 F (36.8 C), temperature source Oral, resp. rate 18, height 5' 10"  (1.778 m), weight 128.3 kg, SpO2 100 %.  Intake/Output Summary (Last 24 hours) at 04/14/2018 1017 Last data filed at 04/14/2018 0900 Gross per 24 hour  Intake 720 ml  Output 1551 ml  Net -831 ml   Filed Weights   04/11/18 0500 04/13/18 0429 04/14/18 0457  Weight: (!) 148.7 kg 132 kg 128.3 kg    Exam: Awake Alert, Oriented *3, No new F.N deficits, Normal affect [place].AT,PERRAL Supple Neck,No JVD, No cervical lymphadenopathy appriciated.  Symmetrical Chest wall movement, Good air movement bilaterally, CTAB RRR,No Gallops,Rubs or new Murmurs, No Parasternal Heave +ve B.Sounds, Abd Soft, Non tender, No organomegaly appriciated, No rebound -guarding or rigidity. No Cyanosis, Clubbing or edema, No new Rash or bruise   PERTINENT RADIOLOGIC STUDIES: Dg Chest 1 View  Result Date: 04/08/2018 CLINICAL DATA:  Hemodialysis catheter insertion. EXAM: CHEST  1 VIEW COMPARISON:  04/08/2018 FINDINGS: There is a right IJ central venous catheter in the mid SVC. The catheter tip is long lateral wall near the brachiocephalic SVC junction. The endotracheal tube is in good position at the mid tracheal level. There is an NG tube coursing down the esophagus and into the stomach. Stable cardiac enlargement and prominent mediastinum. Persistent pulmonary edema and pleural  effusions. Slight improved lung aeration when compared to earlier film. IMPRESSION: 1. Left IJ central venous catheter tip in the SVC near the brachiocephalic SVC junction. 2. Right subclavian central venous catheter is stable. 3. Stable ET and NG tubes. 4. Persistent cardiac enlargement, central vascular congestion and pulmonary edema but slight improved aeration since earlier film. 5. Small effusions. Electronically Signed   By: Marijo Sanes M.D.   On: 04/08/2018 13:11   US Renal  Result Date: 04/07/2018 CLINICAL DATA:  Acute renal failure. EXAM: RENAL / URINARY TRACT ULTRASOUND COMPLETE COMPARISON:  Yesterday. FINDINGS: Right Kidney: Renal measurements: 13.2 x 6.8 x 6 0 cm = volume: 281 mL . Echogenicity within normal limits. No mass or hydronephrosis visualized. Left Kidney: Renal measurements: 13.2 x 6 0 x 5.2 cm = volume: 215 mL.  Echogenicity within normal limits. No mass or hydronephrosis visualized. Bladder: Not visualized with a Foley catheter in place. IMPRESSION: 1. Normal kidneys without hydronephrosis. 2. Nonvisualized urinary bladder with a Foley catheter in place. Electronically Signed   By: Claudie Revering M.D.   On: 04/07/2018 19:38   Dg Chest Port 1 View  Result Date: 04/12/2018 CLINICAL DATA:  Abnormal respirations. EXAM: PORTABLE CHEST 1 VIEW COMPARISON:  04/10/2018. FINDINGS: Interim extubation and removal of NG tube. Left IJ line noted with tip over superior vena cava. Cardiomegaly. Diffuse bilateral pulmonary infiltrates/edema. Improved aeration right upper lung. No pleural effusion or pneumothorax. IMPRESSION: 1. Interim extubation and removal of NG tube. Left IJ line stable position. 2.  Cardiomegaly, no interim change. 3. Diffuse bilateral pulmonary infiltrates/edema noted. Improved aeration right upper lung. Electronically Signed   By: Marcello Moores  Register   On: 04/12/2018 06:19   Dg Chest Port 1 View  Result Date: 04/10/2018 CLINICAL DATA:  Hypoxia EXAM: PORTABLE CHEST 1 VIEW  COMPARISON:  April 09, 2018 FINDINGS: Endotracheal tube tip is 7.5 cm above the carina. Central catheter tip is at the junction of the superior vena cava and left innominate vein. Nasogastric tube tip and side port are below the diaphragm. No pneumothorax. : There is patchy atelectasis in the left lower lobe and right upper lobe regions. There is no appreciable edema or consolidation. Heart is mildly enlarged with pulmonary vascularity normal. No adenopathy. No bone lesions. IMPRESSION: Tube and catheter positions as described without pneumothorax. Areas of patchy atelectasis bilaterally. No frank consolidation or edema. Stable cardiac enlargement. Electronically Signed   By: Lowella Grip III M.D.   On: 04/10/2018 07:16   Dg Chest Port 1 View  Result Date: 04/09/2018 CLINICAL DATA:  Shortness of breath EXAM: PORTABLE CHEST 1 VIEW COMPARISON:  04/08/2018 FINDINGS: Endotracheal tube tip is at the level of the clavicular heads. Left IJ central venous catheter tip is in the mid SVC. Orogastric tube tip is beyond the field of view. There is moderate cardiomegaly with mild pulmonary edema and small pleural effusions. Right subclavian approach central venous catheter is unchanged. IMPRESSION: Cardiomegaly, small pleural effusions and mild pulmonary edema. Unchanged support apparatus. Electronically Signed   By: Ulyses Jarred M.D.   On: 04/09/2018 06:18   Dg Chest Port 1 View  Result Date: 04/08/2018 CLINICAL DATA:  Respiratory failure EXAM: PORTABLE CHEST 1 VIEW COMPARISON:  04/07/2018 FINDINGS: Support Apparatus: --Endotracheal tube: Tip at the level of the clavicular heads. --Enteric tube:Tip and sideport are below the field of view. --Catheter(s):Right subclavian vein approach central venous catheter tip is at the lower SVC --Other: None Unchanged cardiomegaly and mild pulmonary edema. Failing pleural effusions are also unchanged. IMPRESSION: Unchanged appearance of the chest with findings of congestive  heart failure. Unchanged support apparatus. Electronically Signed   By: Ulyses Jarred M.D.   On: 04/08/2018 05:17   Dg Chest Port 1 View  Result Date: 04/07/2018 CLINICAL DATA:  Acute respiratory failure. EXAM: PORTABLE CHEST 1 VIEW COMPARISON:  Earlier today. FINDINGS: Endotracheal tube in satisfactory position. Stable right central venous catheter. Nasogastric tube extending into the stomach. Progressive enlargement of the cardiac silhouette. Diffuse prominence of the pulmonary vasculature and interstitial markings with less bilateral airspace opacity with an improved inspiration. Unremarkable bones. IMPRESSION: Mildly progressive cardiomegaly with grossly stable changes of congestive heart failure. Electronically Signed   By: Claudie Revering M.D.   On: 04/07/2018 19:42     PERTINENT LAB RESULTS: CBC: Recent Labs  04/13/18 0454 04/14/18 0420  WBC  --  3.4*  HGB 7.1* 7.6*  HCT 23.8* 25.9*  PLT  --  82*   CMET CMP     Component Value Date/Time   NA 140 04/14/2018 0420   K 3.4 (L) 04/14/2018 0420   CL 106 04/14/2018 0420   CO2 25 04/14/2018 0420   GLUCOSE 341 (H) 04/14/2018 0420   BUN 18 04/14/2018 0420   CREATININE 1.94 (H) 04/14/2018 0420   CALCIUM 7.2 (L) 04/14/2018 0420   PROT 5.7 (L) 04/13/2018 0454   ALBUMIN 2.2 (L) 04/14/2018 0420   AST 11 (L) 04/13/2018 0454   ALT 8 04/13/2018 0454   ALKPHOS 106 04/13/2018 0454   BILITOT 0.9 04/13/2018 0454   GFRNONAA 40 (L) 04/14/2018 0420   GFRAA 46 (L) 04/14/2018 0420    GFR Estimated Creatinine Clearance: 63.3 mL/min (A) (by C-G formula based on SCr of 1.94 mg/dL (H)). No results for input(s): LIPASE, AMYLASE in the last 72 hours. No results for input(s): CKTOTAL, CKMB, CKMBINDEX, TROPONINI in the last 72 hours. Invalid input(s): POCBNP No results for input(s): DDIMER in the last 72 hours. No results for input(s): HGBA1C in the last 72 hours. No results for input(s): CHOL, HDL, LDLCALC, TRIG, CHOLHDL, LDLDIRECT in the last 72  hours. No results for input(s): TSH, T4TOTAL, T3FREE, THYROIDAB in the last 72 hours.  Invalid input(s): FREET3 No results for input(s): VITAMINB12, FOLATE, FERRITIN, TIBC, IRON, RETICCTPCT in the last 72 hours. Coags: No results for input(s): INR in the last 72 hours.  Invalid input(s): PT Microbiology: Recent Results (from the past 240 hour(s))  MRSA PCR Screening     Status: None   Collection Time: 04/07/18  6:22 PM  Result Value Ref Range Status   MRSA by PCR NEGATIVE NEGATIVE Final    Comment:        The GeneXpert MRSA Assay (FDA approved for NASAL specimens only), is one component of a comprehensive MRSA colonization surveillance program. It is not intended to diagnose MRSA infection nor to guide or monitor treatment for MRSA infections. Performed at [facility], [place] [address] [place], [place] [address]   Culture, blood (routine x 2)     Status: None   Collection Time: 04/07/18  9:24 PM  Result Value Ref Range Status   Specimen Description BLOOD LEFT HAND  Final   Special Requests   Final    BOTTLES DRAWN AEROBIC AND ANAEROBIC Blood Culture adequate volume   Culture   Final    NO GROWTH 5 DAYS Performed at [facility], [place] [address], [place], [place] [address]    Report Status 04/12/2018 FINAL  Final  Culture, blood (routine x 2)     Status: None   Collection Time: 04/07/18  9:24 PM  Result Value Ref Range Status   Specimen Description BLOOD RIGHT HAND  Final   Special Requests   Final    BOTTLES DRAWN AEROBIC ONLY Blood Culture results may not be optimal due to an inadequate volume of blood received in culture bottles   Culture   Final    NO GROWTH 5 DAYS Performed at [facility], [place] [address], [place],  [address]    Report Status 04/12/2018 FINAL  Final  Urine culture     Status: Abnormal   Collection Time: 04/08/18  4:37 AM  Result Value Ref Range Status   Specimen Description URINE, CATHETERIZED  Final   Special Requests  Final    NONE Performed at [facility], [place] [address] [place], [place] [address]    Culture 70,000 COLONIES/mL YEAST (A)  Final   Report Status 04/09/2018 FINAL  Final    FURTHER DISCHARGE INSTRUCTIONS:  Get Medicines reviewed and adjusted: Please take all your medications with you for your next visit with your Primary MD  Laboratory/radiological data: Please request your Primary MD to go over all hospital tests and procedure/radiological results at the follow up, please ask your Primary MD to get all Hospital records sent to his/her office.  In some cases, they will be blood work, cultures and biopsy results pending at the time of your discharge. Please request that your primary care M.D. goes through all the records of your hospital data and follows up on these results.  Also Note the following: If you experience worsening of your admission symptoms, develop shortness of breath, life threatening emergency, suicidal or homicidal thoughts you must seek medical attention immediately by calling 911 or calling your MD immediately  if symptoms less severe.  You must read complete instructions/literature along with all the possible adverse reactions/side effects for all the Medicines you take and that have been prescribed to you. Take any new Medicines after you have completely understood and accpet all the possible adverse reactions/side effects.   Do not drive when taking Pain medications or sleeping medications (Benzodaizepines)  Do not take more than prescribed Pain, Sleep and Anxiety Medications. It is not advisable to combine anxiety,sleep and pain medications without talking with your primary care practitioner  Special Instructions: If you have smoked or chewed Tobacco  in the last 2 yrs please stop smoking, stop any regular Alcohol  and or any Recreational drug use.  Wear Seat belts while driving.  Please note: You were cared for by a hospitalist during your hospital  stay. Once you are discharged, your primary care physician will handle any further medical issues. Please note that NO REFILLS for any discharge medications will be authorized once you are discharged, as it is imperative that you return to your primary care physician (or establish a relationship with a primary care physician if you do not have one) for your post hospital discharge needs so that they can reassess your need for medications and monitor your lab values.  Total Time spent coordinating discharge including counseling, education and face to face time equals 35 minutes.  SignedOren Binet 04/14/2018 10:17 AM

## 2018-04-21 ENCOUNTER — Inpatient Hospital Stay (HOSPITAL_COMMUNITY): Payer: Medicare Other

## 2018-04-21 ENCOUNTER — Inpatient Hospital Stay (HOSPITAL_COMMUNITY)
Admission: AD | Admit: 2018-04-21 | Discharge: 2018-05-05 | DRG: 208 | Disposition: A | Discharge status: Skilled Nursing Facility | Payer: Medicare Other | Source: Other Acute Inpatient Hospital | Attending: Internal Medicine | Admitting: Internal Medicine

## 2018-04-21 DIAGNOSIS — K7210 Chronic hepatic failure without coma: Secondary | ICD-10-CM

## 2018-04-21 DIAGNOSIS — J150 Pneumonia due to Klebsiella pneumoniae: Secondary | ICD-10-CM | POA: Diagnosis present

## 2018-04-21 DIAGNOSIS — G9341 Metabolic encephalopathy: Secondary | ICD-10-CM | POA: Diagnosis present

## 2018-04-21 DIAGNOSIS — I129 Hypertensive chronic kidney disease with stage 1 through stage 4 chronic kidney disease, or unspecified chronic kidney disease: Secondary | ICD-10-CM | POA: Diagnosis not present

## 2018-04-21 DIAGNOSIS — Z79899 Other long term (current) drug therapy: Secondary | ICD-10-CM

## 2018-04-21 DIAGNOSIS — J9601 Acute respiratory failure with hypoxia: Secondary | ICD-10-CM | POA: Diagnosis present

## 2018-04-21 DIAGNOSIS — N400 Enlarged prostate without lower urinary tract symptoms: Secondary | ICD-10-CM | POA: Diagnosis present

## 2018-04-21 DIAGNOSIS — N184 Chronic kidney disease, stage 4 (severe): Secondary | ICD-10-CM

## 2018-04-21 DIAGNOSIS — R401 Stupor: Secondary | ICD-10-CM | POA: Diagnosis not present

## 2018-04-21 DIAGNOSIS — Z20828 Contact with and (suspected) exposure to other viral communicable diseases: Secondary | ICD-10-CM | POA: Diagnosis not present

## 2018-04-21 DIAGNOSIS — I959 Hypotension, unspecified: Secondary | ICD-10-CM | POA: Diagnosis not present

## 2018-04-21 DIAGNOSIS — N183 Chronic kidney disease, stage 3 (moderate): Secondary | ICD-10-CM | POA: Diagnosis not present

## 2018-04-21 DIAGNOSIS — Y95 Nosocomial condition: Secondary | ICD-10-CM | POA: Diagnosis present

## 2018-04-21 DIAGNOSIS — T85598A Other mechanical complication of other gastrointestinal prosthetic devices, implants and grafts, initial encounter: Secondary | ICD-10-CM

## 2018-04-21 DIAGNOSIS — E872 Acidosis: Secondary | ICD-10-CM | POA: Diagnosis not present

## 2018-04-21 DIAGNOSIS — E1165 Type 2 diabetes mellitus with hyperglycemia: Secondary | ICD-10-CM | POA: Diagnosis present

## 2018-04-21 DIAGNOSIS — G40909 Epilepsy, unspecified, not intractable, without status epilepticus: Secondary | ICD-10-CM | POA: Diagnosis present

## 2018-04-21 DIAGNOSIS — Z6841 Body Mass Index (BMI) 40.0 and over, adult: Secondary | ICD-10-CM | POA: Diagnosis not present

## 2018-04-21 DIAGNOSIS — L8990 Pressure ulcer of unspecified site, unspecified stage: Secondary | ICD-10-CM

## 2018-04-21 DIAGNOSIS — R011 Cardiac murmur, unspecified: Secondary | ICD-10-CM | POA: Diagnosis present

## 2018-04-21 DIAGNOSIS — N170 Acute kidney failure with tubular necrosis: Secondary | ICD-10-CM | POA: Diagnosis not present

## 2018-04-21 DIAGNOSIS — D631 Anemia in chronic kidney disease: Secondary | ICD-10-CM | POA: Diagnosis not present

## 2018-04-21 DIAGNOSIS — G9340 Encephalopathy, unspecified: Secondary | ICD-10-CM | POA: Diagnosis not present

## 2018-04-21 DIAGNOSIS — J9690 Respiratory failure, unspecified, unspecified whether with hypoxia or hypercapnia: Secondary | ICD-10-CM

## 2018-04-21 DIAGNOSIS — R4182 Altered mental status, unspecified: Secondary | ICD-10-CM

## 2018-04-21 DIAGNOSIS — Z794 Long term (current) use of insulin: Secondary | ICD-10-CM

## 2018-04-21 DIAGNOSIS — Z91013 Allergy to seafood: Secondary | ICD-10-CM

## 2018-04-21 DIAGNOSIS — J189 Pneumonia, unspecified organism: Secondary | ICD-10-CM

## 2018-04-21 DIAGNOSIS — K7290 Hepatic failure, unspecified without coma: Secondary | ICD-10-CM | POA: Diagnosis present

## 2018-04-21 DIAGNOSIS — N179 Acute kidney failure, unspecified: Secondary | ICD-10-CM | POA: Diagnosis present

## 2018-04-21 DIAGNOSIS — R400 Somnolence: Secondary | ICD-10-CM | POA: Diagnosis not present

## 2018-04-21 DIAGNOSIS — Z87440 Personal history of urinary (tract) infections: Secondary | ICD-10-CM

## 2018-04-21 DIAGNOSIS — K219 Gastro-esophageal reflux disease without esophagitis: Secondary | ICD-10-CM | POA: Diagnosis not present

## 2018-04-21 DIAGNOSIS — Z993 Dependence on wheelchair: Secondary | ICD-10-CM | POA: Diagnosis not present

## 2018-04-21 DIAGNOSIS — K7460 Unspecified cirrhosis of liver: Secondary | ICD-10-CM | POA: Diagnosis present

## 2018-04-21 DIAGNOSIS — E876 Hypokalemia: Secondary | ICD-10-CM | POA: Diagnosis present

## 2018-04-21 DIAGNOSIS — J9600 Acute respiratory failure, unspecified whether with hypoxia or hypercapnia: Secondary | ICD-10-CM | POA: Diagnosis not present

## 2018-04-21 DIAGNOSIS — E1122 Type 2 diabetes mellitus with diabetic chronic kidney disease: Secondary | ICD-10-CM | POA: Diagnosis present

## 2018-04-21 HISTORY — DX: Pressure ulcer of unspecified site, unspecified stage: L89.90

## 2018-04-21 HISTORY — DX: Pneumonia, unspecified organism: J18.9

## 2018-04-21 HISTORY — DX: Altered mental status, unspecified: R41.82

## 2018-04-21 LAB — CBC WITH DIFFERENTIAL/PLATELET
Abs Immature Granulocytes: 0.07 10*3/uL (ref 0.00–0.07)
Basophils Absolute: 0 10*3/uL (ref 0.0–0.1)
Basophils Relative: 1 %
Eosinophils Absolute: 0 10*3/uL (ref 0.0–0.5)
Eosinophils Relative: 1 %
HCT: 25.4 % — ABNORMAL LOW (ref 39.0–52.0)
Hemoglobin: 7.3 g/dL — ABNORMAL LOW (ref 13.0–17.0)
Immature Granulocytes: 1 %
Lymphocytes Relative: 13 %
Lymphs Abs: 0.8 10*3/uL (ref 0.7–4.0)
MCH: 28.6 pg (ref 26.0–34.0)
MCHC: 28.7 g/dL — ABNORMAL LOW (ref 30.0–36.0)
MCV: 99.6 fL (ref 80.0–100.0)
Monocytes Absolute: 0.4 10*3/uL (ref 0.1–1.0)
Monocytes Relative: 8 %
Neutro Abs: 4.3 10*3/uL (ref 1.7–7.7)
Neutrophils Relative %: 76 %
Platelets: 132 10*3/uL — ABNORMAL LOW (ref 150–400)
RBC: 2.55 MIL/uL — ABNORMAL LOW (ref 4.22–5.81)
RDW: 16.8 % — ABNORMAL HIGH (ref 11.5–15.5)
WBC: 5.6 10*3/uL (ref 4.0–10.5)
nRBC: 0 % (ref 0.0–0.2)

## 2018-04-21 LAB — COMPREHENSIVE METABOLIC PANEL
ALT: 9 U/L (ref 0–44)
AST: 7 U/L — ABNORMAL LOW (ref 15–41)
Albumin: 2.3 g/dL — ABNORMAL LOW (ref 3.5–5.0)
Alkaline Phosphatase: 121 U/L (ref 38–126)
Anion gap: 10 (ref 5–15)
BUN: 47 mg/dL — ABNORMAL HIGH (ref 6–20)
CO2: 16 mmol/L — ABNORMAL LOW (ref 22–32)
Calcium: 7.6 mg/dL — ABNORMAL LOW (ref 8.9–10.3)
Chloride: 114 mmol/L — ABNORMAL HIGH (ref 98–111)
Creatinine, Ser: 6.9 mg/dL — ABNORMAL HIGH (ref 0.61–1.24)
GFR calc Af Amer: 10 mL/min — ABNORMAL LOW (ref >60)
GFR calc non Af Amer: 9 mL/min — ABNORMAL LOW (ref >60)
Glucose, Bld: 166 mg/dL — ABNORMAL HIGH (ref 70–99)
Potassium: 2.9 mmol/L — ABNORMAL LOW (ref 3.5–5.1)
Sodium: 140 mmol/L (ref 135–145)
Total Bilirubin: 0.3 mg/dL (ref 0.3–1.2)
Total Protein: 5.7 g/dL — ABNORMAL LOW (ref 6.5–8.1)

## 2018-04-21 LAB — POCT I-STAT 7, (LYTES, BLD GAS, ICA,H+H)
Acid-base deficit: 10 mmol/L — ABNORMAL HIGH (ref 0.0–2.0)
Bicarbonate: 17 mmol/L — ABNORMAL LOW (ref 20.0–28.0)
Calcium, Ion: 1.18 mmol/L (ref 1.15–1.40)
HCT: 21 % — ABNORMAL LOW (ref 39.0–52.0)
Hemoglobin: 7.1 g/dL — ABNORMAL LOW (ref 13.0–17.0)
O2 Saturation: 98 %
POTASSIUM: 3.1 mmol/L — AB (ref 3.5–5.1)
Patient temperature: 35.3
Sodium: 145 mmol/L (ref 135–145)
TCO2: 18 mmol/L — ABNORMAL LOW (ref 22–32)
pCO2 arterial: 39.9 mmHg (ref 32.0–48.0)
pH, Arterial: 7.229 — ABNORMAL LOW (ref 7.350–7.450)
pO2, Arterial: 111 mmHg — ABNORMAL HIGH (ref 83.0–108.0)

## 2018-04-21 LAB — AMMONIA: Ammonia: 35 umol/L (ref 9–35)

## 2018-04-21 LAB — BRAIN NATRIURETIC PEPTIDE: B Natriuretic Peptide: 254.9 pg/mL — ABNORMAL HIGH (ref 0.0–100.0)

## 2018-04-21 LAB — LACTIC ACID, PLASMA: Lactic Acid, Venous: 1.1 mmol/L (ref 0.5–1.9)

## 2018-04-21 MED ORDER — VANCOMYCIN HCL 10 G IV SOLR
2000.0000 mg | Freq: Once | INTRAVENOUS | Status: AC
  Administered 2018-04-22: 2000 mg via INTRAVENOUS
  Filled 2018-04-21: qty 2000

## 2018-04-21 MED ORDER — MIDAZOLAM HCL 2 MG/2ML IJ SOLN
2.0000 mg | INTRAMUSCULAR | Status: DC | PRN
  Administered 2018-04-23: 2 mg via INTRAVENOUS
  Filled 2018-04-21: qty 2

## 2018-04-21 MED ORDER — PHENOBARBITAL 20 MG/5ML PO ELIX
240.0000 mg | ORAL_SOLUTION | Freq: Every day | ORAL | Status: DC
  Administered 2018-04-22 (×2): 240 mg
  Filled 2018-04-21: qty 60

## 2018-04-21 MED ORDER — ENOXAPARIN SODIUM 30 MG/0.3ML ~~LOC~~ SOLN
30.0000 mg | Freq: Every day | SUBCUTANEOUS | Status: DC
  Administered 2018-04-22: 30 mg via SUBCUTANEOUS
  Filled 2018-04-21 (×2): qty 0.3

## 2018-04-21 MED ORDER — INSULIN ASPART 100 UNIT/ML ~~LOC~~ SOLN: 0.0000 [IU] | Freq: Three times a day (TID) | SUBCUTANEOUS | Status: DC

## 2018-04-21 MED ORDER — ORAL CARE MOUTH RINSE
15.0000 mL | OROMUCOSAL | Status: DC
  Administered 2018-04-21 – 2018-04-23 (×17): 15 mL via OROMUCOSAL

## 2018-04-21 MED ORDER — FENTANYL CITRATE (PF) 100 MCG/2ML IJ SOLN: 50.0000 ug | Freq: Once | INTRAMUSCULAR | Status: DC

## 2018-04-21 MED ORDER — DOCUSATE SODIUM 50 MG/5ML PO LIQD: 100.0000 mg | Freq: Two times a day (BID) | ORAL | Status: DC | PRN

## 2018-04-21 MED ORDER — FENTANYL BOLUS VIA INFUSION
50.0000 ug | INTRAVENOUS | Status: DC | PRN
  Administered 2018-04-22 – 2018-04-23 (×9): 50 ug via INTRAVENOUS
  Filled 2018-04-21: qty 50

## 2018-04-21 MED ORDER — PANTOPRAZOLE SODIUM 40 MG PO PACK
40.0000 mg | PACK | Freq: Two times a day (BID) | ORAL | Status: DC
  Administered 2018-04-22 – 2018-04-23 (×3): 40 mg via ORAL
  Filled 2018-04-21 (×3): qty 20

## 2018-04-21 MED ORDER — VANCOMYCIN VARIABLE DOSE PER UNSTABLE RENAL FUNCTION (PHARMACIST DOSING): Status: DC

## 2018-04-21 MED ORDER — INSULIN ASPART 100 UNIT/ML ~~LOC~~ SOLN
0.0000 [IU] | Freq: Every day | SUBCUTANEOUS | Status: DC
  Administered 2018-04-21: 4 [IU] via SUBCUTANEOUS

## 2018-04-21 MED ORDER — MIDAZOLAM HCL 2 MG/2ML IJ SOLN: 2.0000 mg | INTRAMUSCULAR | Status: DC | PRN

## 2018-04-21 MED ORDER — SODIUM CHLORIDE 0.9 % IV SOLN
1.0000 g | Freq: Every day | INTRAVENOUS | Status: DC
  Administered 2018-04-21 – 2018-04-24 (×3): 1 g via INTRAVENOUS
  Filled 2018-04-21 (×4): qty 1

## 2018-04-21 MED ORDER — LACTULOSE 10 GM/15ML PO SOLN
30.0000 g | Freq: Three times a day (TID) | ORAL | Status: DC
  Administered 2018-04-22 – 2018-04-24 (×7): 30 g
  Filled 2018-04-21 (×7): qty 45

## 2018-04-21 MED ORDER — CHLORHEXIDINE GLUCONATE 0.12% ORAL RINSE (MEDLINE KIT)
15.0000 mL | Freq: Two times a day (BID) | OROMUCOSAL | Status: DC
  Administered 2018-04-21 – 2018-05-05 (×16): 15 mL via OROMUCOSAL
  Filled 2018-04-21: qty 15

## 2018-04-21 MED ORDER — FENTANYL 2500MCG IN NS 250ML (10MCG/ML) PREMIX INFUSION
25.0000 ug/h | INTRAVENOUS | Status: DC
  Administered 2018-04-21: 100 ug/h via INTRAVENOUS
  Administered 2018-04-22: 25 ug/h via INTRAVENOUS
  Administered 2018-04-22: 225 ug/h via INTRAVENOUS
  Administered 2018-04-23: 250 ug/h via INTRAVENOUS
  Filled 2018-04-21 (×2): qty 250

## 2018-04-21 MED ORDER — RIFAXIMIN 550 MG PO TABS
550.0000 mg | ORAL_TABLET | Freq: Two times a day (BID) | ORAL | Status: DC
  Administered 2018-04-22 – 2018-04-24 (×5): 550 mg
  Filled 2018-04-21 (×6): qty 1

## 2018-04-21 NOTE — H&P (Addendum)
NAME:  Eddie Davies, MRN:  [id number], DOB:  1970/11/10, LOS: 0 ADMISSION DATE:  04/21/2018, CONSULTATION DATE:  04/21/18 REFERRING MD:  [place], CHIEF COMPLAINT:  Altered mental status   Brief History   tx from [place] ams s/p intubation  History of present illness   48 year old male with history of dm, seizures, htn, esld, esrd ckd stage IV presents as transfer from [facility] for AMS requiring intubation.  The patient had an admission last week for encephalopathy with subsequent intubation.  He required abx for complicated UTI, CRRT for dialysis while hypotensive, lactulose, rifaximin for ESLD>  Patitent was d/c to snf.  He was noted in [place] note to have recently refused dialsysis and medications for ESLD.  He was altered in ED and intubated for airway protection.  He had cordis placed in ED for possible dialysis.  Ct head normal.  cxr with ett in place.  Ammonia level 33.   Past Medical History  Dm Seizures htn esld esrd  Significant Hospital Events   Ett 04/21/18 at [place] 04/21/18 at [place]:  renal  Procedures:    Significant Diagnostic Tests:  Cbc wbc 6.8, hb 7.7 cmet bun 48, crea 7.1 cxr questionable RUL infiltrate Ct head normal   Micro Data:  Blood cx 04/21/18 ucx 04/21/18   Antimicrobials:    Interim history/subjective:    Objective   Pulse 81, resp. rate 15, SpO2 97 %.    FiO2 (%):  [40 %] 40 %  No intake or output data in the 24 hours ending 04/21/18 2147 There were no vitals filed for this visit.  Examination: General: intubated, sedated, moves all extremities  HENT: perrla Lungs: slight rhonchi Cardiovascular: rrr no m/r/g Abdomen: soft nt, nd bs+ Extremities: no le edema Neuro: moves all ext GU: foley in place   Resolved Hospital Problem list     Assessment & Plan:  48 year old male complicated medical history presents with AMS of uncertain etiology intubated for airway protection   AMS-  ammonia level 33. utox negative except bar which he is taking for seizures.   cxr questionable RUL infiltate without white count.  U/a appeared normal. Hd stable.   - will repeat ammonia level  - will repeat cxr  - will follow up on ucx, blood cx - will minimize sedation to fentanyl gtt , prn versed , rass goal 0.    hcap- cxr shows right upper lobe infiltrate. Given recent hosp by definition hcap.  - will start vancomycin per pharmacy consult - will start cefipime   ckd stage IV - will continue to monitor UOP - will try to keep even for now given HD stable  - has RIJ cordis if HD needed   ESLD - will restart lactulose and rifaximin  Seizures - will continue phenobarbital  htn - will hold bp meds given bp 100/62 at this time   DM - will give SSI  GERD - will contiue ome 35m bid   Best practice:  Diet: npo Pain/Anxiety/Delirium protocol (if indicated): fentanyl gtt, versed prn VAP protocol (if indicated): yes DVT prophylaxis: lovenox GI prophylaxis: omeprazole 438mbid  Glucose control: ssi Mobility:  Code Status: full code Family Communication:  Disposition:   Labs   CBC: No results for input(s): WBC, NEUTROABS, HGB, HCT, MCV, PLT in the last 168 hours.  Basic Metabolic Panel: No results for input(s): NA, K, CL, CO2, GLUCOSE, BUN, CREATININE, CALCIUM, MG, PHOS in the  last 168 hours. GFR: Estimated Creatinine Clearance: 63.3 mL/min (A) (by C-G formula based on SCr of 1.94 mg/dL (H)). No results for input(s): PROCALCITON, WBC, LATICACIDVEN in the last 168 hours.  Liver Function Tests: No results for input(s): AST, ALT, ALKPHOS, BILITOT, PROT, ALBUMIN in the last 168 hours. No results for input(s): LIPASE, AMYLASE in the last 168 hours. No results for input(s): AMMONIA in the last 168 hours.  ABG    Component Value Date/Time   PHART 7.339 (L) 04/09/2018 0313   PCO2ART 50.7 (H) 04/09/2018 0313   PO2ART 231.0 (H) 04/09/2018 0313   HCO3 27.3 04/09/2018 0313    TCO2 29 04/09/2018 0313   ACIDBASEDEF 10.0 (H) 04/08/2018 1628   O2SAT 100.0 04/09/2018 0313     Coagulation Profile: No results for input(s): INR, PROTIME in the last 168 hours.  Cardiac Enzymes: No results for input(s): CKTOTAL, CKMB, CKMBINDEX, TROPONINI in the last 168 hours.  HbA1C: Hgb A1c MFr Bld  Date/Time Value Ref Range Status  04/07/2018 07:48 PM 8.3 (H) 4.8 - 5.6 % Final    Comment:    (NOTE) Pre diabetes:          5.7%-6.4% Diabetes:              >6.4% Glycemic control for   <7.0% adults with diabetes     CBG: No results for input(s): GLUCAP in the last 168 hours.  Review of Systems:     Past Medical History  He,  has no past medical history on file.   Surgical History      Social History   has an unknown smoking status. He has never used smokeless tobacco.   Family History   His family history is not on file.   Allergies Allergies  Allergen Reactions  . Fish Oil Swelling and Other (See Comments)          Home Medications  Prior to Admission medications   Medication Sig Start Date End Date Taking? Authorizing Provider  acetaminophen (TYLENOL) 650 MG CR tablet Take 650 mg by mouth every 8 (eight) hours as needed for pain.    [provider]  amLODipine (NORVASC) 5 MG tablet Take 5 mg by mouth daily.     [provider]  carbamazepine (TEGRETOL XR) 200 MG 12 hr tablet Take 600 mg by mouth 2 (two) times daily.     [provider]  Cholecalciferol (VITAMIN D) 50 MCG (2000 UT) tablet Take 2,000 Units by mouth daily.    [provider]  gabapentin (NEURONTIN) 100 MG capsule Take 200 mg by mouth 3 (three) times daily.     [provider]  hydrALAZINE (APRESOLINE) 50 MG tablet Take 50 mg by mouth 3 (three) times daily.    [provider]  Insulin Glargine (BASAGLAR KWIKPEN) 100 UNIT/ML SOPN Inject 15 Units into the skin 2 (two) times daily.    [provider]  insulin lispro (HUMALOG  KWIKPEN) 100 UNIT/ML KwikPen Inject 0-12 Units into the skin See admin instructions. Inject 0-12 units subcutaneously three times daily per sliding scale - CBG 0-150 0 units, 151-200 2 units, 201-250 4 units, 251-300 6 units, 301-350 8 units, 351-400 10 units, 401-450 12 units.    [provider]  lactulose, encephalopathy, (CHRONULAC) 10 GM/15ML SOLN Take 30 mLs (20 g total) by mouth daily. 04/14/18   [name], [name], MD  magnesium oxide (MAG-OX) 400 MG tablet Take 400 mg by mouth daily.    [provider]  omeprazole (PRILOSEC) 40 MG capsule Take 40 mg by mouth 2 (two) times daily. 6am and 9pm 11/05/15   [provider]  ondansetron (ZOFRAN-ODT) 4 MG disintegrating tablet Take 4 mg by mouth every 6 (six) hours as needed for nausea or vomiting.    [provider]  PHENobarbital (LUMINAL) 32.4 MG tablet Take 32.4 mg by mouth at bedtime. Take with #3  64.8 mg tablets for a total dose of 226.8 mg    [provider]  PHENobarbital (LUMINAL) 64.8 MG tablet Take 194.4 mg by mouth at bedtime. Take with a 32.4 mg tablet for a total dose of 226.8 mg    [provider]  rifaximin (XIFAXAN) 550 MG TABS tablet Take 550 mg by mouth 2 (two) times daily. For diarrhea    [provider]  simvastatin (ZOCOR) 20 MG tablet Take 20 mg by mouth at bedtime.     [provider]  tamsulosin (FLOMAX) 0.4 MG CAPS capsule Take 0.4 mg by mouth at bedtime.    [provider]     Critical care time: 45 minute

## 2018-04-21 NOTE — Progress Notes (Signed)
RT called about new pt arrival from [place]. Pt placed on vent in PRVC on his 8cc/kg/ibw as ordered with a rate of 15 and FIO2 of 40%. Pt tube 25@lip , ETT 7.5. Pt tolerating settings. Will get ABG in one hour. RT will continue to monitor.

## 2018-04-21 NOTE — Progress Notes (Addendum)
Pharmacy Antibiotic Note  Eddie Davies is a 48 y.o. male admitted on 04/21/2018 with AMS/VDRF/PNA.  Pharmacy has been consulted for Vancomycin and Cefepime dosing.  Plan: Vancomycin 2 g IV now Change Cefepime 1 g IV q24h F/U renal function   Height: 6' (182.9 cm)(per pt and measurement verification RNs present) IBW/kg (Calculated) : 77.6  Temp (24hrs), Avg:95 F (35 C), Min:94.5 F (34.7 C), Max:95.5 F (35.3 C)  Recent Labs  Lab 04/21/18 2209  WBC 5.6  CREATININE 6.90*  LATICACIDVEN 1.1    Estimated Creatinine Clearance: 18.3 mL/min (A) (by C-G formula based on SCr of 6.9 mg/dL (H)).    Allergies  Allergen Reactions  . Fish Oil Swelling and Other (See Comments)         Caryl Pina 04/21/2018 11:14 PM

## 2018-04-22 DIAGNOSIS — J189 Pneumonia, unspecified organism: Secondary | ICD-10-CM

## 2018-04-22 DIAGNOSIS — R400 Somnolence: Secondary | ICD-10-CM

## 2018-04-22 DIAGNOSIS — J9601 Acute respiratory failure with hypoxia: Secondary | ICD-10-CM

## 2018-04-22 LAB — POCT I-STAT 7, (LYTES, BLD GAS, ICA,H+H)
Acid-base deficit: 7 mmol/L — ABNORMAL HIGH (ref 0.0–2.0)
Bicarbonate: 19.6 mmol/L — ABNORMAL LOW (ref 20.0–28.0)
Calcium, Ion: 1.13 mmol/L — ABNORMAL LOW (ref 1.15–1.40)
HCT: 26 % — ABNORMAL LOW (ref 39.0–52.0)
Hemoglobin: 8.8 g/dL — ABNORMAL LOW (ref 13.0–17.0)
O2 Saturation: 95 %
PH ART: 7.253 — AB (ref 7.350–7.450)
Potassium: 2.8 mmol/L — ABNORMAL LOW (ref 3.5–5.1)
Sodium: 149 mmol/L — ABNORMAL HIGH (ref 135–145)
TCO2: 21 mmol/L — ABNORMAL LOW (ref 22–32)
pCO2 arterial: 44.4 mmHg (ref 32.0–48.0)
pO2, Arterial: 89 mmHg (ref 83.0–108.0)

## 2018-04-22 LAB — AMMONIA: Ammonia: 37 umol/L — ABNORMAL HIGH (ref 9–35)

## 2018-04-22 LAB — CBC
HEMATOCRIT: 22.6 % — AB (ref 39.0–52.0)
HEMOGLOBIN: 6.7 g/dL — AB (ref 13.0–17.0)
MCH: 29.3 pg (ref 26.0–34.0)
MCHC: 29.6 g/dL — ABNORMAL LOW (ref 30.0–36.0)
MCV: 98.7 fL (ref 80.0–100.0)
Platelets: 148 10*3/uL — ABNORMAL LOW (ref 150–400)
RBC: 2.29 MIL/uL — ABNORMAL LOW (ref 4.22–5.81)
RDW: 17 % — AB (ref 11.5–15.5)
WBC: 6.5 10*3/uL (ref 4.0–10.5)
nRBC: 0 % (ref 0.0–0.2)

## 2018-04-22 LAB — BASIC METABOLIC PANEL
Anion gap: 12 (ref 5–15)
BUN: 49 mg/dL — ABNORMAL HIGH (ref 6–20)
CHLORIDE: 116 mmol/L — AB (ref 98–111)
CO2: 19 mmol/L — ABNORMAL LOW (ref 22–32)
Calcium: 7.4 mg/dL — ABNORMAL LOW (ref 8.9–10.3)
Creatinine, Ser: 6.39 mg/dL — ABNORMAL HIGH (ref 0.61–1.24)
GFR calc Af Amer: 11 mL/min — ABNORMAL LOW (ref >60)
GFR calc non Af Amer: 9 mL/min — ABNORMAL LOW (ref >60)
Glucose, Bld: 129 mg/dL — ABNORMAL HIGH (ref 70–99)
Potassium: 2.8 mmol/L — ABNORMAL LOW (ref 3.5–5.1)
SODIUM: 147 mmol/L — AB (ref 135–145)

## 2018-04-22 LAB — GLUCOSE, CAPILLARY
GLUCOSE-CAPILLARY: 114 mg/dL — AB (ref 70–99)
Glucose-Capillary: 109 mg/dL — ABNORMAL HIGH (ref 70–99)
Glucose-Capillary: 114 mg/dL — ABNORMAL HIGH (ref 70–99)
Glucose-Capillary: 118 mg/dL — ABNORMAL HIGH (ref 70–99)
Glucose-Capillary: 139 mg/dL — ABNORMAL HIGH (ref 70–99)

## 2018-04-22 LAB — EXPECTORATED SPUTUM ASSESSMENT W GRAM STAIN, RFLX TO RESP C: Special Requests: NORMAL

## 2018-04-22 LAB — HIV ANTIBODY (ROUTINE TESTING W REFLEX): HIV Screen 4th Generation wRfx: NONREACTIVE

## 2018-04-22 LAB — MRSA PCR SCREENING: MRSA by PCR: NEGATIVE

## 2018-04-22 LAB — STREP PNEUMONIAE URINARY ANTIGEN: Strep Pneumo Urinary Antigen: NEGATIVE

## 2018-04-22 LAB — LACTIC ACID, PLASMA: Lactic Acid, Venous: 0.9 mmol/L (ref 0.5–1.9)

## 2018-04-22 MED ORDER — INSULIN ASPART 100 UNIT/ML ~~LOC~~ SOLN
1.0000 [IU] | SUBCUTANEOUS | Status: DC
  Administered 2018-04-22 – 2018-04-23 (×4): 1 [IU] via SUBCUTANEOUS

## 2018-04-22 MED ORDER — NOREPINEPHRINE 4 MG/250ML-% IV SOLN
0.0000 ug/min | INTRAVENOUS | Status: DC
  Administered 2018-04-22: 2 ug/min via INTRAVENOUS
  Filled 2018-04-22: qty 250

## 2018-04-22 MED ORDER — POTASSIUM CHLORIDE 10 MEQ/100ML IV SOLN
10.0000 meq | INTRAVENOUS | Status: AC
  Administered 2018-04-22 (×4): 10 meq via INTRAVENOUS
  Filled 2018-04-22 (×4): qty 100

## 2018-04-22 NOTE — Plan of Care (Signed)

## 2018-04-22 NOTE — Progress Notes (Signed)
NAME:  Eddie Davies, MRN:  [id number], DOB:  1970-02-24, LOS: 1 ADMISSION DATE:  04/21/2018, CONSULTATION DATE:  04/21/18 REFERRING MD:  [place], CHIEF COMPLAINT:  Altered mental status   Brief History   tx from [place] ams s/p intubation  History of present illness   48 year old male with history of dm, seizures, htn, esld, esrd ckd stage IV presents as transfer from [facility] for AMS requiring intubation.  The patient had an admission last week for encephalopathy with subsequent intubation.  He required abx for complicated UTI, CRRT for dialysis while hypotensive, lactulose, rifaximin for ESLD>  Patitent was d/c to snf.  He was noted in [place] note to have recently refused dialsysis and medications for ESLD.  He was altered in ED and intubated for airway protection.  He had cordis placed in ED for possible dialysis.  Ct head normal.  cxr with ett in place.  Ammonia level 33.   Past Medical History  Dm Seizures htn esld esrd  Significant Hospital Events   Ett 04/21/18 at [place] 04/21/18 at [place]:  renal  Procedures:    Significant Diagnostic Tests:  Cbc wbc 6.8, hb 7.7 cmet bun 48, crea 7.1 cxr questionable RUL infiltrate Ct head normal   Micro Data:  Blood cx 04/21/18 ucx 04/21/18   Antimicrobials:    Interim history/subjective:  Follow simple commands  Not weaning this am  Weaned off pressors   Objective   Blood pressure (!) 93/46, pulse 74, temperature (!) 97.3 F (36.3 C), resp. rate 11, height 6' (1.829 m), weight 130.6 kg, SpO2 97 %.    Vent Mode: PRVC FiO2 (%):  [40 %] 40 % Set Rate:  [15 bmp] 15 bmp Vt Set:  [620 mL] 620 mL PEEP:  [5 cmH20] 5 cmH20 Plateau Pressure:  [16 cmH20-22 cmH20] 16 cmH20   Intake/Output Summary (Last 24 hours) at 04/22/2018 1746 Last data filed at 04/22/2018 1700 Gross per 24 hour  Intake 1022.21 ml  Output 2120 ml  Net -1097.79 ml   Filed Weights   04/22/18 0000  Weight:  130.6 kg    Examination: General: male sedated on vent   HENT: ETT  Lungs: coarse BS on vent  Cardiovascular: RRR , no mrg  Abdomen: soft nt, nd bs+ Extremities: + edema Neuro: moves all ext, f/c simple commands, sedated  GU: foley in place   Resolved Hospital Problem list     Assessment & Plan:  48 year old male complicated medical history presents with AMS of uncertain etiology intubated for airway protection   AMS- ammonia level 33. utox negative except  which he is taking for seizures.   cxr questionable RUL infiltate without white count.  U/a appeared normal. Hd stable.   - will repeat ammonia level  - will repeat cxr in am  - ucx, blood cx pend  - sedation to fentanyl gtt , prn versed , rass goal 0.     hcap- cxr shows right upper lobe infiltrate. Given recent hosp by definition hcap.  -   vancomycin per pharmacy consult -  cefipime  -check abg    ckd stage IV  - will continue to monitor UOP - will try to keep even for now given HD stable  - has RIJ cordis if HD needed  -check labs today , pending most likely needs renal consult   ESLD - cont  lactulose and rifaximin -check ammonia   Seizures - will continue  phenobarbital  htn Off pressors  - will hold bp meds    DM -  SSI change to q4   GERD - will contiue PPI  37m bid   Anemia  Transfuse per protocol   Hypokalemia  Check bmet   Best practice:  Diet: npo Pain/Anxiety/Delirium protocol (if indicated): fentanyl gtt, versed prn VAP protocol (if indicated): yes DVT prophylaxis: lovenox GI prophylaxis: omeprazole 425mbid  Glucose control: ssi Mobility:  Code Status: full code Family Communication:  Disposition: ICU   Labs   CBC: Recent Labs  Lab 04/21/18 2209 04/21/18 2303  WBC 5.6  --   NEUTROABS 4.3  --   HGB 7.3* 7.1*  HCT 25.4* 21.0*  MCV 99.6  --   PLT 132*  --     Basic Metabolic Panel: Recent Labs  Lab 04/21/18 2209 04/21/18 2303  NA 140 145  K 2.9* 3.1*  CL 114*   --   CO2 16*  --   GLUCOSE 166*  --   BUN 47*  --   CREATININE 6.90*  --   CALCIUM 7.6*  --    GFR: Estimated Creatinine Clearance: 18.5 mL/min (A) (by C-G formula based on SCr of 6.9 mg/dL (H)). Recent Labs  Lab 04/21/18 2209 04/22/18 0419  WBC 5.6  --   LATICACIDVEN 1.1 0.9    Liver Function Tests: Recent Labs  Lab 04/21/18 2209  AST 7*  ALT 9  ALKPHOS 121  BILITOT 0.3  PROT 5.7*  ALBUMIN 2.3*   No results for input(s): LIPASE, AMYLASE in the last 168 hours. Recent Labs  Lab 04/21/18 2209  AMMONIA 35    ABG    Component Value Date/Time   PHART 7.229 (L) 04/21/2018 2303   PCO2ART 39.9 04/21/2018 2303   PO2ART 111.0 (H) 04/21/2018 2303   HCO3 17.0 (L) 04/21/2018 2303   TCO2 18 (L) 04/21/2018 2303   ACIDBASEDEF 10.0 (H) 04/21/2018 2303   O2SAT 98.0 04/21/2018 2303     Coagulation Profile: No results for input(s): INR, PROTIME in the last 168 hours.  Cardiac Enzymes: No results for input(s): CKTOTAL, CKMB, CKMBINDEX, TROPONINI in the last 168 hours.  HbA1C: Hgb A1c MFr Bld  Date/Time Value Ref Range Status  04/07/2018 07:48 PM 8.3 (H) 4.8 - 5.6 % Final    Comment:    (NOTE) Pre diabetes:          5.7%-6.4% Diabetes:              >6.4% Glycemic control for   <7.0% adults with diabetes     CBG: Recent Labs  Lab 04/22/18 0751 04/22/18 1143 04/22/18 1646  GLUCAP 114* 118* 109*    Review of Systems:     Past Medical History  He,  has no past medical history on file.   Surgical History      Social History   has an unknown smoking status. He has never used smokeless tobacco.   Family History   His family history is not on file.   Allergies Allergies  Allergen Reactions  . Fish Oil Swelling and Other (See Comments)          Home Medications  Prior to Admission medications   Medication Sig Start Date End Date Taking? Authorizing Provider  acetaminophen (TYLENOL) 650 MG CR tablet Take 650 mg by mouth every 8 (eight) hours as  needed for pain.    [provider]  amLODipine (NORVASC) 5 MG tablet Take 5 mg by mouth daily.  [provider]  carbamazepine (TEGRETOL XR) 200 MG 12 hr tablet Take 600 mg by mouth 2 (two) times daily.     [provider]  Cholecalciferol (VITAMIN D) 50 MCG (2000 UT) tablet Take 2,000 Units by mouth daily.    [provider]  gabapentin (NEURONTIN) 100 MG capsule Take 200 mg by mouth 3 (three) times daily.     [provider]  hydrALAZINE (APRESOLINE) 50 MG tablet Take 50 mg by mouth 3 (three) times daily.    [provider]  Insulin Glargine (BASAGLAR KWIKPEN) 100 UNIT/ML SOPN Inject 15 Units into the skin 2 (two) times daily.    [provider]  insulin lispro (HUMALOG KWIKPEN) 100 UNIT/ML KwikPen Inject 0-12 Units into the skin See admin instructions. Inject 0-12 units subcutaneously three times daily per sliding scale - CBG 0-150 0 units, 151-200 2 units, 201-250 4 units, 251-300 6 units, 301-350 8 units, 351-400 10 units, 401-450 12 units.    [provider]  lactulose, encephalopathy, (CHRONULAC) 10 GM/15ML SOLN Take 30 mLs (20 g total) by mouth daily. 04/14/18   [name], [name], MD  magnesium oxide (MAG-OX) 400 MG tablet Take 400 mg by mouth daily.    [provider]  omeprazole (PRILOSEC) 40 MG capsule Take 40 mg by mouth 2 (two) times daily. 6am and 9pm 11/05/15   [provider]  ondansetron (ZOFRAN-ODT) 4 MG disintegrating tablet Take 4 mg by mouth every 6 (six) hours as needed for nausea or vomiting.    [provider]  PHENobarbital (LUMINAL) 32.4 MG tablet Take 32.4 mg by mouth at bedtime. Take with #3  64.8 mg tablets for a total dose of 226.8 mg    [provider]  PHENobarbital (LUMINAL) 64.8 MG tablet Take 194.4 mg by mouth at bedtime. Take with a 32.4 mg tablet for a total dose of 226.8 mg    [provider]  rifaximin (XIFAXAN) 550 MG TABS tablet Take 550 mg by  mouth 2 (two) times daily. For diarrhea    [provider]  simvastatin (ZOCOR) 20 MG tablet Take 20 mg by mouth at bedtime.     [provider]  tamsulosin (FLOMAX) 0.4 MG CAPS capsule Take 0.4 mg by mouth at bedtime.    [provider]     Critical care time: 45 minute          NAME:  Eddie Davies, MRN:  [id number], DOB:  November 28, 1970, LOS: 1 ADMISSION DATE:  04/21/2018, CONSULTATION DATE:  04/21/18 REFERRING MD:  [place], CHIEF COMPLAINT:  Altered mental status   Brief History   tx from  [facility] s/p intubation  History of present illness   48 year old male with history of dm, seizures, htn, esld, esrd ckd stage IV presents as transfer from [facility] for [place] requiring intubation.  The patient had an admission last week for encephalopathy with subsequent intubation.  He required abx for complicated UTI, CRRT for dialysis while hypotensive, lactulose, rifaximin for ESLD>  Patitent was d/c to snf.  He was noted in [place] note to have recently refused dialsysis and medications for ESLD.  He was altered in ED and intubated for airway protection.  He had cordis placed in ED for possible dialysis.  Ct head normal.  cxr with ett in place.  Ammonia level 33.   Past Medical History  Dm Seizures htn esld esrd  Significant Hospital Events   Ett 04/21/18 at [place] 04/21/18  at [place]:  renal  Procedures:    Significant Diagnostic Tests:  Cbc wbc 6.8, hb 7.7 cmet bun 48, crea 7.1 cxr questionable RUL infiltrate Ct head normal   Micro Data:  Blood cx 04/21/18 ucx 04/21/18   Antimicrobials:    Interim history/subjective:    Objective   Blood pressure (!) 93/46, pulse 74, temperature (!) 97.3 F (36.3 C), resp. rate 11, height 6' (1.829 m), weight 130.6 kg, SpO2 97 %.    Vent Mode: PRVC FiO2 (%):  [40 %] 40 % Set Rate:  [15 bmp] 15 bmp Vt Set:  [620 mL] 620 mL PEEP:  [5 cmH20] 5 cmH20 Plateau  Pressure:  [16 cmH20-22 cmH20] 16 cmH20   Intake/Output Summary (Last 24 hours) at 04/22/2018 1746 Last data filed at 04/22/2018 1700 Gross per 24 hour  Intake 1022.21 ml  Output 2120 ml  Net -1097.79 ml   Filed Weights   04/22/18 0000  Weight: 130.6 kg    Examination: General: intubated, sedated, moves all extremities  HENT: perrla Lungs: slight rhonchi Cardiovascular: rrr no m/r/g Abdomen: soft nt, nd bs+ Extremities: no le edema Neuro: moves all ext GU: foley in place   Resolved Hospital Problem list     Assessment & Plan:  48 year old male complicated medical history presents with AMS of uncertain etiology intubated for airway protection   AMS- ammonia level 33. utox negative except bar which he is taking for seizures.   cxr questionable RUL infiltate without white count.  U/a appeared normal. Hd stable.   - will repeat ammonia level  - will repeat cxr  - will follow up on ucx, blood cx - will minimize sedation to fentanyl gtt , prn versed , rass goal 0.    hcap- cxr shows right upper lobe infiltrate. Given recent hosp by definition hcap.  - will start vancomycin per pharmacy consult - will start cefipime   ckd stage IV - will continue to monitor UOP - will try to keep even for now given HD stable  - has RIJ cordis if HD needed   ESLD - will restart lactulose and rifaximin  Seizures - will continue phenobarbital  htn - will hold bp meds given bp 100/62 at this time   DM - will give SSI  GERD - will contiue ome 37m bid   Best practice:  Diet: npo Pain/Anxiety/Delirium protocol (if indicated): fentanyl gtt, versed prn VAP protocol (if indicated): yes DVT prophylaxis: lovenox GI prophylaxis: omeprazole 451mbid  Glucose control: ssi Mobility:  Code Status: full code Family Communication:  Disposition:   Labs   CBC: Recent Labs  Lab 04/21/18 2209 04/21/18 2303  WBC 5.6  --   NEUTROABS 4.3  --   HGB 7.3* 7.1*  HCT 25.4* 21.0*  MCV 99.6   --   PLT 132*  --     Basic Metabolic Panel: Recent Labs  Lab 04/21/18 2209 04/21/18 2303  NA 140 145  K 2.9* 3.1*  CL 114*  --   CO2 16*  --   GLUCOSE 166*  --   BUN 47*  --   CREATININE 6.90*  --   CALCIUM 7.6*  --    GFR: Estimated Creatinine Clearance: 18.5 mL/min (A) (by C-G formula based on SCr of 6.9 mg/dL (H)). Recent Labs  Lab 04/21/18 2209 04/22/18 0419  WBC 5.6  --   LATICACIDVEN 1.1 0.9    Liver Function Tests: Recent Labs  Lab 04/21/18 2209  AST 7*  ALT 9  ALKPHOS 121  BILITOT 0.3  PROT 5.7*  ALBUMIN 2.3*   No results for input(s): LIPASE, AMYLASE in the last 168 hours. Recent Labs  Lab 04/21/18 2209  AMMONIA 35    ABG    Component Value Date/Time   PHART 7.229 (L) 04/21/2018 2303   PCO2ART 39.9 04/21/2018 2303   PO2ART 111.0 (H) 04/21/2018 2303   HCO3 17.0 (L) 04/21/2018 2303   TCO2 18 (L) 04/21/2018 2303   ACIDBASEDEF 10.0 (H) 04/21/2018 2303   O2SAT 98.0 04/21/2018 2303     Coagulation Profile: No results for input(s): INR, PROTIME in the last 168 hours.  Cardiac Enzymes: No results for input(s): CKTOTAL, CKMB, CKMBINDEX, TROPONINI in the last 168 hours.  HbA1C: Hgb A1c MFr Bld  Date/Time Value Ref Range Status  04/07/2018 07:48 PM 8.3 (H) 4.8 - 5.6 % Final    Comment:    (NOTE) Pre diabetes:          5.7%-6.4% Diabetes:              >6.4% Glycemic control for   <7.0% adults with diabetes     CBG: Recent Labs  Lab 04/22/18 0751 04/22/18 1143 04/22/18 1646  GLUCAP 114* 118* 109*    Review of Systems:     Past Medical History  He,  has no past medical history on file.   Surgical History      Social History   has an unknown smoking status. He has never used smokeless tobacco.   Family History   His family history is not on file.   Allergies Allergies  Allergen Reactions  . Fish Oil Swelling and Other (See Comments)          Home Medications  Prior to Admission medications   Medication Sig Start  Date End Date Taking? Authorizing Provider  acetaminophen (TYLENOL) 650 MG CR tablet Take 650 mg by mouth every 8 (eight) hours as needed for pain.    [provider]  amLODipine (NORVASC) 5 MG tablet Take 5 mg by mouth daily.     [provider]  carbamazepine (TEGRETOL XR) 200 MG 12 hr tablet Take 600 mg by mouth 2 (two) times daily.     [provider]  Cholecalciferol (VITAMIN D) 50 MCG (2000 UT) tablet Take 2,000 Units by mouth daily.    [provider]  gabapentin (NEURONTIN) 100 MG capsule Take 200 mg by mouth 3 (three) times daily.     [provider]  hydrALAZINE (APRESOLINE) 50 MG tablet Take 50 mg by mouth 3 (three) times daily.    [provider]  Insulin Glargine (BASAGLAR KWIKPEN) 100 UNIT/ML SOPN Inject 15 Units into the skin 2 (two) times daily.    [provider]  insulin lispro (HUMALOG KWIKPEN) 100 UNIT/ML KwikPen Inject 0-12 Units into the skin See admin instructions. Inject 0-12 units subcutaneously three times daily per sliding scale - CBG 0-150 0 units, 151-200 2 units, 201-250 4 units, 251-300 6 units, 301-350 8 units, 351-400 10 units, 401-450 12 units.    [provider]  lactulose, encephalopathy, (CHRONULAC) 10 GM/15ML SOLN Take 30 mLs (20 g total) by mouth daily. 04/14/18   [name], [name], MD  magnesium oxide (MAG-OX) 400 MG tablet Take 400 mg by mouth daily.    [provider]  omeprazole (PRILOSEC) 40 MG capsule Take 40 mg by mouth 2 (two) times daily. 6am and 9pm 11/05/15   [provider]  ondansetron (ZOFRAN-ODT) 4 MG disintegrating tablet Take 4 mg by mouth every 6 (six) hours as needed for nausea or vomiting.    [provider]  PHENobarbital (LUMINAL) 32.4 MG tablet Take 32.4 mg by mouth at bedtime. Take with #3  64.8 mg tablets for a total dose of 226.8 mg    [provider]  PHENobarbital (LUMINAL) 64.8 MG tablet Take 194.4 mg by mouth at bedtime. Take  with a 32.4 mg tablet for a total dose of 226.8 mg    [provider]  rifaximin (XIFAXAN) 550 MG TABS tablet Take 550 mg by mouth 2 (two) times daily. For diarrhea    [provider]  simvastatin (ZOCOR) 20 MG tablet Take 20 mg by mouth at bedtime.     [provider]  tamsulosin (FLOMAX) 0.4 MG CAPS capsule Take 0.4 mg by mouth at bedtime.    [provider]     Critical care time:       Talena Neira NP-C  [facility]  [phone]  04/22/2018

## 2018-04-22 NOTE — Progress Notes (Signed)
Brief note:  Call report Hgb 6.7 , no active bleeding . Has chronic anemia .  VSS. Hold on transfusion for now  Cbc in am , if tr down consider transfusion  Will need renal consult in am as scr remains elevated.   Daril Warga NP-C  [facility]  [phone]  04/22/2018

## 2018-04-22 NOTE — Progress Notes (Signed)
RT late note: RT obtained post intubation/vent placement ABG on pt with the following results. No changes needed at this time. RT will continue to monitor.   Results for JACQUELYN, ANTONY (MRN [id number]) as of 04/22/2018 00:17  Ref. Range 04/21/2018 23:03  Sample type Unknown ARTERIAL  pH, Arterial Latest Ref Range: 7.350 - 7.450  7.229 (L)  pCO2 arterial Latest Ref Range: 32.0 - 48.0 mmHg 39.9  pO2, Arterial Latest Ref Range: 83.0 - 108.0 mmHg 111.0 (H)  TCO2 Latest Ref Range: 22 - 32 mmol/L 18 (L)  Acid-base deficit Latest Ref Range: 0.0 - 2.0 mmol/L 10.0 (H)  Bicarbonate Latest Ref Range: 20.0 - 28.0 mmol/L 17.0 (L)  O2 Saturation Latest Units: % 98.0  Patient temperature Unknown 35.3 C  Collection site Unknown RADIAL, ALLEN'S TEST ACCEPTABLE

## 2018-04-23 ENCOUNTER — Inpatient Hospital Stay (HOSPITAL_COMMUNITY): Payer: Medicare Other

## 2018-04-23 DIAGNOSIS — G9340 Encephalopathy, unspecified: Secondary | ICD-10-CM

## 2018-04-23 DIAGNOSIS — J9600 Acute respiratory failure, unspecified whether with hypoxia or hypercapnia: Secondary | ICD-10-CM

## 2018-04-23 LAB — BLOOD GAS, ARTERIAL
ACID-BASE DEFICIT: 6.5 mmol/L — AB (ref 0.0–2.0)
Bicarbonate: 18.9 mmol/L — ABNORMAL LOW (ref 20.0–28.0)
Drawn by: 350431
FIO2: 0.4
MECHVT: 620 mL
O2 Saturation: 97.2 %
PATIENT TEMPERATURE: 98.5
PEEP: 5 cmH2O
RATE: 15 resp/min
pCO2 arterial: 40.4 mmHg (ref 32.0–48.0)
pH, Arterial: 7.292 — ABNORMAL LOW (ref 7.350–7.450)
pO2, Arterial: 94.6 mmHg (ref 83.0–108.0)

## 2018-04-23 LAB — BASIC METABOLIC PANEL
ANION GAP: 10 (ref 5–15)
BUN: 49 mg/dL — ABNORMAL HIGH (ref 6–20)
CO2: 20 mmol/L — ABNORMAL LOW (ref 22–32)
Calcium: 7.7 mg/dL — ABNORMAL LOW (ref 8.9–10.3)
Chloride: 118 mmol/L — ABNORMAL HIGH (ref 98–111)
Creatinine, Ser: 4.95 mg/dL — ABNORMAL HIGH (ref 0.61–1.24)
GFR calc non Af Amer: 13 mL/min — ABNORMAL LOW (ref >60)
GFR, EST AFRICAN AMERICAN: 15 mL/min — AB (ref >60)
Glucose, Bld: 126 mg/dL — ABNORMAL HIGH (ref 70–99)
Potassium: 3.4 mmol/L — ABNORMAL LOW (ref 3.5–5.1)
Sodium: 148 mmol/L — ABNORMAL HIGH (ref 135–145)

## 2018-04-23 LAB — CBC
HCT: 20.4 % — ABNORMAL LOW (ref 39.0–52.0)
Hemoglobin: 6 g/dL — CL (ref 13.0–17.0)
MCH: 29.3 pg (ref 26.0–34.0)
MCHC: 29.4 g/dL — ABNORMAL LOW (ref 30.0–36.0)
MCV: 99.5 fL (ref 80.0–100.0)
Platelets: 140 10*3/uL — ABNORMAL LOW (ref 150–400)
RBC: 2.05 MIL/uL — ABNORMAL LOW (ref 4.22–5.81)
RDW: 17 % — AB (ref 11.5–15.5)
WBC: 4.6 10*3/uL (ref 4.0–10.5)
nRBC: 0 % (ref 0.0–0.2)

## 2018-04-23 LAB — COMPREHENSIVE METABOLIC PANEL
ALT: 7 U/L (ref 0–44)
AST: 11 U/L — ABNORMAL LOW (ref 15–41)
Albumin: 2 g/dL — ABNORMAL LOW (ref 3.5–5.0)
Alkaline Phosphatase: 109 U/L (ref 38–126)
Anion gap: 10 (ref 5–15)
BUN: 50 mg/dL — ABNORMAL HIGH (ref 6–20)
CO2: 18 mmol/L — AB (ref 22–32)
Calcium: 7.3 mg/dL — ABNORMAL LOW (ref 8.9–10.3)
Chloride: 120 mmol/L — ABNORMAL HIGH (ref 98–111)
Creatinine, Ser: 5.7 mg/dL — ABNORMAL HIGH (ref 0.61–1.24)
GFR calc Af Amer: 13 mL/min — ABNORMAL LOW (ref >60)
GFR calc non Af Amer: 11 mL/min — ABNORMAL LOW (ref >60)
GLUCOSE: 143 mg/dL — AB (ref 70–99)
Potassium: 2.7 mmol/L — CL (ref 3.5–5.1)
Sodium: 148 mmol/L — ABNORMAL HIGH (ref 135–145)
Total Bilirubin: 0.9 mg/dL (ref 0.3–1.2)
Total Protein: 5 g/dL — ABNORMAL LOW (ref 6.5–8.1)

## 2018-04-23 LAB — PREPARE RBC (CROSSMATCH)

## 2018-04-23 LAB — MAGNESIUM: Magnesium: 2.7 mg/dL — ABNORMAL HIGH (ref 1.7–2.4)

## 2018-04-23 LAB — PHOSPHORUS: Phosphorus: 10.9 mg/dL — ABNORMAL HIGH (ref 2.5–4.6)

## 2018-04-23 LAB — GLUCOSE, CAPILLARY
Glucose-Capillary: 135 mg/dL — ABNORMAL HIGH (ref 70–99)
Glucose-Capillary: 140 mg/dL — ABNORMAL HIGH (ref 70–99)
Glucose-Capillary: 122 mg/dL — ABNORMAL HIGH (ref 70–99)
Glucose-Capillary: 104 mg/dL — ABNORMAL HIGH (ref 70–99)

## 2018-04-23 LAB — HEMOGLOBIN AND HEMATOCRIT, BLOOD
HCT: 26.4 % — ABNORMAL LOW (ref 39.0–52.0)
Hemoglobin: 7.7 g/dL — ABNORMAL LOW (ref 13.0–17.0)

## 2018-04-23 MED ORDER — STERILE WATER FOR INJECTION IV SOLN
INTRAVENOUS | Status: DC
  Administered 2018-04-23: 12:00:00 via INTRAVENOUS
  Filled 2018-04-23: qty 850

## 2018-04-23 MED ORDER — ORAL CARE MOUTH RINSE
15.0000 mL | Freq: Four times a day (QID) | OROMUCOSAL | Status: DC
  Administered 2018-04-23 – 2018-05-05 (×21): 15 mL via OROMUCOSAL

## 2018-04-23 MED ORDER — PHENOBARBITAL 97.2 MG PO TABS
97.2000 mg | ORAL_TABLET | Freq: Every day | ORAL | Status: DC
  Administered 2018-04-24 – 2018-05-04 (×11): 97.2 mg via ORAL
  Filled 2018-04-23 (×11): qty 1

## 2018-04-23 MED ORDER — POTASSIUM CHLORIDE 10 MEQ/100ML IV SOLN
10.0000 meq | INTRAVENOUS | Status: AC
  Administered 2018-04-23 (×4): 10 meq via INTRAVENOUS
  Filled 2018-04-23 (×5): qty 100

## 2018-04-23 MED ORDER — KCL-LACTATED RINGERS 20 MEQ/L IV SOLN
INTRAVENOUS | Status: DC
  Administered 2018-04-23 – 2018-04-24 (×2): via INTRAVENOUS
  Filled 2018-04-23 (×3): qty 1000

## 2018-04-23 MED ORDER — SODIUM CHLORIDE 0.9 % IV SOLN
INTRAVENOUS | Status: DC
  Administered 2018-04-23: 08:00:00 via INTRAVENOUS

## 2018-04-23 MED ORDER — POTASSIUM CHLORIDE 20 MEQ PO PACK
40.0000 meq | PACK | Freq: Two times a day (BID) | ORAL | Status: DC
  Filled 2018-04-23: qty 2

## 2018-04-23 MED ORDER — RENA-VITE PO TABS
1.0000 | ORAL_TABLET | Freq: Every day | ORAL | Status: DC
  Administered 2018-04-24 – 2018-05-04 (×11): 1 via ORAL
  Filled 2018-04-23 (×11): qty 1

## 2018-04-23 MED ORDER — SODIUM CHLORIDE 0.9% IV SOLUTION: Freq: Once | INTRAVENOUS | Status: AC

## 2018-04-23 MED ORDER — DEXMEDETOMIDINE HCL IN NACL 200 MCG/50ML IV SOLN
0.4000 ug/kg/h | INTRAVENOUS | Status: DC
  Administered 2018-04-23: 0.4 ug/kg/h via INTRAVENOUS
  Filled 2018-04-23: qty 50

## 2018-04-23 MED ORDER — PHENOBARBITAL 20 MG/5ML PO ELIX: 90.0000 mg | ORAL_SOLUTION | Freq: Every day | ORAL | Status: DC

## 2018-04-23 NOTE — Progress Notes (Signed)
CRITICAL VALUE ALERT  Critical Value:  K 2.8, Hgb 6.0  Date & Time Notied:  03/30 0520  Provider Notified: Elink  Orders Received/Actions taken: IV K 40 Meq

## 2018-04-23 NOTE — Progress Notes (Signed)
Consent given by Mosie Lukes, patient cousin is okay to give PRBC. Verified by Griffin Dakin, RN at this time. RN will continue to monitor.

## 2018-04-23 NOTE — Progress Notes (Signed)
NAME:  Eddie Davies, MRN:  [id number], DOB:  [birth year], LOS: 2 ADMISSION DATE:  04/21/2018, CONSULTATION DATE:  04/21/18 REFERRING MD:  Ch[place], CHIEF COMPLAINT:  Altered mental status   Brief History   tx from [place] ams s/p intubation  History of present illness   48 year old male with history of dm, seizures, htn, esld, esrd ckd stage IV presents as transfer from [facility] for AMS requiring intubation.  The patient had an admission 3/14- 3/21 for encephalopathy with subsequent intubation.  He required abx for complicated UTI, CRRT for dialysis while hypotensive, lactulose, rifaximin for ESLD>  Patitent was d/c to snf.  He was noted in [place] note to have recently refused dialsysis and medications for ESLD.  He was altered in ED and intubated for airway protection.  He had cordis placed in ED for possible dialysis.  Ct head normal.  cxr with ett in place.  Ammonia level 33.   Past Medical History  Dm Seizures htn esld esrd  Significant Hospital Events   Ett 04/21/18 at [facility] 04/21/18 at [facility]:  renal  Procedures:    Significant Diagnostic Tests:  OHS >>cxr questionable RUL infiltrate    Ct head normal   Micro Data:  Blood cx 3/28 >> ucx 04/21/18 > not sent Trach asp 3/29 >> UC 3/30 >>  Antimicrobials:  3/28 cefepime >> 2/28 vanc >>  Interim history/subjective:  Ongoing intermittent agitation- on fentanyl 300 mcg/min Bit ETT balloon off- still getting TV's, no obvious leak  Awakens /fc's- denies pain Remains afebrile/ normal WBC  Objective   Blood pressure (!) 99/54, pulse 72, temperature (P) 98.2 F (36.8 C), resp. rate 15, height 6' (1.829 m), weight (!) 136.6 kg, SpO2 97 %.    Vent Mode: PRVC FiO2 (%):  [40 %] 40 % Set Rate:  [15 bmp] 15 bmp Vt Set:  [620 mL] 620 mL PEEP:  [5 cmH20] 5 cmH20 Plateau Pressure:  [16 cmH20-19 cmH20] 17 cmH20   Intake/Output Summary (Last 24 hours) at 04/23/2018 8938 Last data  filed at 04/23/2018 0600 Gross per 24 hour  Intake 779.8 ml  Output 2400 ml  Net -1620.2 ml   Filed Weights   04/22/18 0000 04/23/18 0000  Weight: 130.6 kg (!) 136.6 kg    Examination: General:  Morbidly obese, chronically ill appearing male on MV in NAD HEENT: pupils 3/reactive, anicteric, ETT 25 cm at lip/ OGT  Neuro: Awakens to verbal- some initial agitation but calms, f/c, MAE- restrained  CV:  RR, no murmur PULM: even/non-labored, lungs bilaterally coarse- Low MVe on PSV GI: obese, soft, non-tender, bs active  Extremities: warm/dry, generalized edema, foley Skin: no rash, severe dry scaly skin knees downs  Resolved Hospital Problem list     Assessment & Plan:  48 year old male complicated medical history presents with AMS of uncertain etiology intubated for airway protection   Acute encephalopathy  - etiology unclear  - ammonia level 33. utox negative except  which he is taking for seizures.   cxr questionable RUL infiltate without white count.  U/a appeared normal. sCr elevated   P:  Add precedex, wean fentanyl gtt off, change to PRN Trend ammonia   HCAP Acute respiratory insufficiency in the setting of acute encephalopathy  -treating for RUL infiltrate - CXR 3/30 reviewed- ETT high- has since be advanced to baseline 25 cm, R infiltrate improving  P:  Full MV support, failed SBT today- too sedated Continue ETT  for now- getting volumes, will need to monitor for air leak/ ensure air leak prior to extubation RR increased to 20 to help with metabolic acidosis Repeat ABG in 1 hr Changing to precedex and hopeful to wean/ extubate later if mental status allows Trend CXR/ ABG Continue vanc /cefepime Follow culture data  Tend WBC/ fever curve   CKD stage IV  Metabolic / hyperchloremic acidosis Hypokalemia P:  Nephrology consulted, appreciate recs  RR increased to help compensation  S/p KCL replete Continue to trend UOP/ BMP/ mag/ phos ? Prior refusal of HD, I dont  see this clearly documented, given chronic conditions and recurrent hospitalizations, PMT consulted.   Additionally, hopeful to extubate soon - today/ tomorrow and clarify GOCs  Acute on chronic Anemia - Hgb 7.1-> 6.7 -> 8.8-> 6.8 P:  Asymptomatic/ normotensive, no signs of bleeding Recheck at 1600 Hold transfusion at this time Defer ESA to nephrology   ESLD P:  stable LFTs /trend cont  lactulose and rifaximin check ammonia 3/31  Seizures P: continue phenobarbital Monitor for seizures   HTN P:  Off pressors, holding home meds- normotensive  DM P:  SSI sensistive CBG q4   GERD P:  PPI  75m bid   Best practice:  Diet: npo, start TF if not extubated today  Pain/Anxiety/Delirium protocol (if indicated): add precedex, change fentanyl to prn VAP protocol (if indicated): yes DVT prophylaxis: lovenox GI prophylaxis: PPI Glucose control: SSI sensitive Mobility: BR Code Status: full code Family Communication: RMosie Lukes patients cousin whom he lived with prior to going to SNF.  Also has some sibblings- but they are "adopted out" and not in close contact.  Disposition: ICU   Labs   CBC: Recent Labs  Lab 04/21/18 2209 04/21/18 2303 04/22/18 1802 04/22/18 1839 04/23/18 0435  WBC 5.6  --  6.5  --  4.6  NEUTROABS 4.3  --   --   --   --   HGB 7.3* 7.1* 6.7* 8.8* 6.0*  HCT 25.4* 21.0* 22.6* 26.0* 20.4*  MCV 99.6  --  98.7  --  99.5  PLT 132*  --  148*  --  140*    Basic Metabolic Panel: Recent Labs  Lab 04/21/18 2209 04/21/18 2303 04/22/18 1802 04/22/18 1839 04/23/18 0435  NA 140 145 147* 149* 148*  K 2.9* 3.1* 2.8* 2.8* 2.7*  CL 114*  --  116*  --  120*  CO2 16*  --  19*  --  18*  GLUCOSE 166*  --  129*  --  143*  BUN 47*  --  49*  --  50*  CREATININE 6.90*  --  6.39*  --  5.70*  CALCIUM 7.6*  --  7.4*  --  7.3*  MG  --   --   --   --  2.7*  PHOS  --   --   --   --  10.9*   GFR: Estimated Creatinine Clearance: 22.9 mL/min (A) (by C-G formula  based on SCr of 5.7 mg/dL (H)). Recent Labs  Lab 04/21/18 2209 04/22/18 0419 04/22/18 1802 04/23/18 0435  WBC 5.6  --  6.5 4.6  LATICACIDVEN 1.1 0.9  --   --     Liver Function Tests: Recent Labs  Lab 04/21/18 2209 04/23/18 0435  AST 7* 11*  ALT 9 7  ALKPHOS 121 109  BILITOT 0.3 0.9  PROT 5.7* 5.0*  ALBUMIN 2.3* 2.0*   No results for input(s): LIPASE, AMYLASE in the last 168 hours.  Recent Labs  Lab 04/21/18 2209 04/22/18 1802  AMMONIA 35 37*    ABG    Component Value Date/Time   PHART 7.292 (L) 04/23/2018 0615   PCO2ART 40.4 04/23/2018 0615   PO2ART 94.6 04/23/2018 0615   HCO3 18.9 (L) 04/23/2018 0615   TCO2 21 (L) 04/22/2018 1839   ACIDBASEDEF 6.5 (H) 04/23/2018 0615   O2SAT 97.2 04/23/2018 0615     Coagulation Profile: No results for input(s): INR, PROTIME in the last 168 hours.  Cardiac Enzymes: No results for input(s): CKTOTAL, CKMB, CKMBINDEX, TROPONINI in the last 168 hours.  HbA1C: Hgb A1c MFr Bld  Date/Time Value Ref Range Status  04/07/2018 07:48 PM 8.3 (H) 4.8 - 5.6 % Final    Comment:    (NOTE) Pre diabetes:          5.7%-6.4% Diabetes:              >6.4% Glycemic control for   <7.0% adults with diabetes     CBG: Recent Labs  Lab 04/22/18 1143 04/22/18 1646 04/22/18 1940 04/22/18 2337 04/23/18 0328  GLUCAP 118* 109* 114* 139* 135*    CCT 40 mins  Kennieth Rad, MSN, AGACNP-BC [facility] Pgr: [phone] or if no answer [phone] 04/23/2018, 9:58 AM

## 2018-04-23 NOTE — Procedures (Signed)
Extubation Procedure Note  Patient Details:   Name: Eddie Davies DOB: [birth year] MRN: [id number]   Airway Documentation:    Vent end date: 04/23/18 Vent end time: 1245   Evaluation  O2 sats: stable throughout Complications: No apparent complications Patient did tolerate procedure well. Bilateral Breath Sounds: Rhonchi, Diminished   Yes   Patient extubated to bipap per CCM order. RT wasn't able to check for cuff leak due to patient ripping off his pilot balloon. RN was at the bedside with RT during extubation. Patient appears to be tolerating well at this time.  Renato Gails Makynleigh Breslin 04/23/2018, 1:02 PM

## 2018-04-23 NOTE — Consult Note (Addendum)
[facility]    NEPHROLOGY CONSULTATION NOTE  PATIENT ID:  Eddie Davies, DOB:  1970-08-06  HPI: The patient is a 47 y.o. year old male with a past medical history significant for cognitive delay, liver disease, seizure disorder, and chronic kidney disease stage III with a baseline serum creatinine around 1.9 who was recently admitted to [facility] health on 04/07/2018 with altered mental status and was found to have a complicated urinary tract infection.  He was treated with meropenem to end on 04/14/2018 and amoxicillin to and on 04/15/2018.  He required CRRT during that hospitalization.  He was discharged with a downtrending creatinine, which was 1.94 on 04/14/2018 at the time of his discharge.  He was readmitted via [place] on 3/28 for altered mental status requiring intubation.  He was apparently refusing his lactulose at the nursing home.  He was treated with empiric cefepime and vancomycin, and transferred to [facility] for further evaluation.  His serum creatinine on admission was 5.7 with a hemoglobin of 6.   PAST MEDICAL HISTORY As above.   FAMILY HISTORY Unobtainable secondary to intubation and sedation on the ventilator.  Social History   Tobacco Use  . Smoking status: Unknown If Ever Smoked  . Smokeless tobacco: Never Used  Substance Use Topics  . Alcohol use: Not on file  . Drug use: Not on file    REVIEW OF SYSTEMS: Unobtainable secondary to intubation and sedation on the ventilator.   PHYSICAL EXAM:  Vitals:   04/23/18 1400 04/23/18 1432  BP: 119/68 119/68  Pulse:  65  Resp: 13 (!) 9  Temp:  98.1 F (36.7 C)  SpO2: 100%    I/O last 3 completed shifts: In: 1725.4 [I.V.:495.6; NG/GT:150; IV Piggyback:1079.7] Out: 3354 [Urine:1570; Emesis/NG output:2200; Stool:100]   General: He is intubated, sedated, and in no acute distress. HEENT: MMM [place] AT anicteric sclera Neck:  No JVD, no adenopathy CV:  Heart RRR  Lungs:  L/S coarse bilaterally Abd:  abd  SNT/ND with normal BS GU:  Bladder non-palpable Extremities:  No LE edema. Skin:  No skin rash, poor skin turgor, diffuse ichthyosis  psych: Sedated Neuro:  no focal deficits   CURRENT MEDICATIONS:  . sodium chloride   Intravenous Once  . chlorhexidine gluconate (MEDLINE KIT)  15 mL Mouth Rinse BID  . insulin aspart  1-3 Units Subcutaneous Q4H  . lactulose  30 g Per Tube TID  . mouth rinse  15 mL Mouth Rinse QID  . multivitamin  1 tablet Oral QHS  . pantoprazole sodium  40 mg Oral BID AC  . phenobarbital  97.2 mg Oral QHS  . rifaximin  550 mg Per Tube BID  . vancomycin variable dose per unstable renal function (pharmacist dosing)   Does not apply See admin instructions     HOME MEDICATIONS:  Prior to Admission medications   Medication Sig Start Date End Date Taking? Authorizing Provider  acetaminophen (TYLENOL) 325 MG tablet Take 650 mg by mouth every 8 (eight) hours as needed for fever (pain).   Yes [provider]  amLODipine (NORVASC) 5 MG tablet Take 5 mg by mouth daily.    Yes [provider]  carbamazepine (TEGRETOL) 200 MG tablet Take 600 mg by mouth 2 (two) times daily.   Yes [provider]  Cholecalciferol (VITAMIN D) 50 MCG (2000 UT) tablet Take 2,000 Units by mouth daily.   Yes [provider]  gabapentin (NEURONTIN) 100 MG capsule Take 200 mg by mouth  3 (three) times daily.    Yes [provider]  guaiFENesin (MUCINEX) 600 MG 12 hr tablet Take 600 mg by mouth every 12 (twelve) hours as needed for cough (congestion).   Yes [provider]  hydrALAZINE (APRESOLINE) 50 MG tablet Take 50 mg by mouth 3 (three) times daily.   Yes [provider]  Insulin Glargine (BASAGLAR KWIKPEN) 100 UNIT/ML SOPN Inject 17 Units into the skin 2 (two) times daily.    Yes [provider]  insulin lispro (HUMALOG KWIKPEN) 100 UNIT/ML KwikPen Inject 0-12 Units into the skin See admin instructions. Inject 0-12 units  subcutaneously three times daily per sliding scale - CBG 0-150 0 units, 151-200 2 units, 201-250 4 units, 251-300 6 units, 301-350 8 units, 351-400 10 units, 401-450 12 units.   Yes [provider]  lactulose, encephalopathy, (CHRONULAC) 10 GM/15ML SOLN Take 30 mLs (20 g total) by mouth daily. Patient taking differently: Take 30 g by mouth 2 (two) times daily.  04/14/18  Yes [name], [name], MD  linagliptin (TRADJENTA) 5 MG TABS tablet Take 5 mg by mouth daily.   Yes [provider]  magnesium oxide (MAG-OX) 400 MG tablet Take 400 mg by mouth daily.   Yes [provider]  omeprazole (PRILOSEC OTC) 20 MG tablet Take 40 mg by mouth daily.   Yes [provider]  ondansetron (ZOFRAN-ODT) 4 MG disintegrating tablet Take 4 mg by mouth every 6 (six) hours as needed for nausea or vomiting.   Yes [provider]  PHENobarbital (LUMINAL) 32.4 MG tablet Take 32.4 mg by mouth at bedtime. Take with #3  64.8 mg tablets for a total dose of 226.8 mg   Yes [provider]  PHENobarbital (LUMINAL) 64.8 MG tablet Take 194.4 mg by mouth at bedtime. Take with a 32.4 mg tablet for a total dose of 226.8 mg   Yes [provider]  rifaximin (XIFAXAN) 550 MG TABS tablet Take 550 mg by mouth 2 (two) times daily. For diarrhea   Yes [provider]  simvastatin (ZOCOR) 20 MG tablet Take 20 mg by mouth at bedtime.    Yes [provider]  tamsulosin (FLOMAX) 0.4 MG CAPS capsule Take 0.4 mg by mouth at bedtime.   Yes [provider]       LABS:  CBC Latest Ref Rng & Units 04/23/2018 04/22/2018 04/22/2018  WBC 4.0 - 10.5 K/uL 4.6 - 6.5  Hemoglobin 13.0 - 17.0 g/dL 6.0(LL) 8.8(L) 6.7(LL)  Hematocrit 39.0 - 52.0 % 20.4(L) 26.0(L) 22.6(L)  Platelets 150 - 400 K/uL 140(L) - 148(L)    CMP Latest Ref Rng & Units 04/23/2018 04/22/2018 04/22/2018  Glucose 70 - 99 mg/dL 143(H) - 129(H)  BUN 6 - 20 mg/dL 50(H) - 49(H)  Creatinine 0.61 - 1.24 mg/dL  5.70(H) - 6.39(H)  Sodium 135 - 145 mmol/L 148(H) 149(H) 147(H)  Potassium 3.5 - 5.1 mmol/L 2.7(LL) 2.8(L) 2.8(L)  Chloride 98 - 111 mmol/L 120(H) - 116(H)  CO2 22 - 32 mmol/L 18(L) - 19(L)  Calcium 8.9 - 10.3 mg/dL 7.3(L) - 7.4(L)  Total Protein 6.5 - 8.1 g/dL 5.0(L) - -  Total Bilirubin 0.3 - 1.2 mg/dL 0.9 - -  Alkaline Phos 38 - 126 U/L 109 - -  AST 15 - 41 U/L 11(L) - -  ALT 0 - 44 U/L 7 - -    Lab Results  Component Value Date   CALCIUM 7.3 (L) 04/23/2018   CAION 1.13 (L) 04/22/2018  PHOS 10.9 (H) 04/23/2018       Component Value Date/Time   COLORURINE AMBER (A) 04/08/2018 1306   APPEARANCEUR CLOUDY (A) 04/08/2018 1306   LABSPEC 1.018 04/08/2018 1306   PHURINE 5.0 04/08/2018 1306   GLUCOSEU NEGATIVE 04/08/2018 1306   HGBUR MODERATE (A) 04/08/2018 1306   BILIRUBINUR NEGATIVE 04/08/2018 1306   KETONESUR NEGATIVE 04/08/2018 1306   PROTEINUR 100 (A) 04/08/2018 1306   NITRITE NEGATIVE 04/08/2018 1306   LEUKOCYTESUR LARGE (A) 04/08/2018 1306      Component Value Date/Time   PHART 7.292 (L) 04/23/2018 0615   PCO2ART 40.4 04/23/2018 0615   PO2ART 94.6 04/23/2018 0615   HCO3 18.9 (L) 04/23/2018 0615   TCO2 21 (L) 04/22/2018 1839   ACIDBASEDEF 6.5 (H) 04/23/2018 0615   O2SAT 97.2 04/23/2018 0615    No results found for: IRON, TIBC, FERRITIN, IRONPCTSAT     ASSESSMENT/PLAN:     Problem List Items Addressed This Visit    None    Visit Diagnoses    Respiratory failure ([place])       Relevant Orders   DG CHEST PORT 1 VIEW (Completed)   DG Chest Port 1 View (Completed)   Impaired oral gastric feeding tube, initial encounter       Relevant Orders   DG Abd 1 View (Completed)      1.  Chronic kidney disease stage III.  His baseline serum creatinine is 1.9.  2.  Acute kidney injury.  His urine output has been excellent today, and his creatinine is downtrending.  He appears dry on examination.  Would continue supportive treatment.  No urgent indication for dialysis  today.  3.  Anemia.  This is an acute change in his hemoglobin.  Would transfuse as needed.  Would heme check stools and check iron studies to further evaluate.  Anemia is out of proportion to his renal disease.  Could add ESA once acute bleed is ruled out  4.  Hypokalemia.  Continue potassium repletion.  Will change ongoing IV fluids to lactated Ringer's with potassium.  5.  Liver disease.  Continue rifaximin and lactulose.  Replete potassium as hypokalemia can drive high ammonia levels.  [place], DO, [place]

## 2018-04-23 NOTE — Progress Notes (Signed)
Spoke with patient's cousin Mosie Lukes on the phone. Patient, "Eddie Davies" used to live with [name] prior to going to SNF less than 1 year ago.  Robbie committed to Bo's mother when she died that he would take care of Bo.  Eddie Davies has 3 living brothers and 1 sister that [name] would want to involve in any medical decision making.  PMT scheduled a meeting (conference call with [name]) for tomorrow (3/31) at 4:00 pm.   If he is still on [place] we would like to ask for Elink's help so that his family can see him.  Florentina Jenny, [name] Palliative Medicine Pager: [phone]   No charge note.

## 2018-04-23 NOTE — Progress Notes (Signed)
[facility] Progress Note Patient Name: Eddie Davies DOB: 03/26/1970 MRN: [id number]   Date of Service  04/23/2018  HPI/Events of Note  Extubated today - Currently on PO medications. NGT pulled out at extubation.   eICU Interventions  Will order: 1. Swallow evaluation in AM. 2. If he fails swallow evaluation, will need gastric tube replaced.      Intervention Category Major Interventions: Other:  Lysle Dingwall 04/23/2018, 10:42 PM

## 2018-04-23 NOTE — Progress Notes (Signed)
150 ml of Fentanyl wasted at this time with Marzella Schlein, RN.

## 2018-04-23 NOTE — Progress Notes (Signed)
Initial Nutrition Assessment  DOCUMENTATION CODES:   Obesity unspecified  INTERVENTION:    Nepro Shake po TID, each supplement provides 425 kcal and 19 grams protein once diet advanced.   Renal MVI daily  Recommend phosphorus binder therapy with meals and snacks.   NUTRITION DIAGNOSIS:   Increased nutrient needs related to chronic illness(ESRD on HD) as evidenced by estimated needs.  GOAL:   Patient will meet greater than or equal to 90% of their needs  MONITOR:   Diet advancement, Supplement acceptance, PO intake, Labs, Weight trends, I & O's, Skin  REASON FOR ASSESSMENT:   Consult, Ventilator Enteral/tube feeding initiation and management  ASSESSMENT:   Patient with PMH significant for ESRD on HD, DM, seizures, and HTN. Recently admitted 6/65-9/93 for complicated UTI and required CRRT. Transfer[facility] from [facility] from unclear etiology. Suspected missed HD treatments and refusal of medication prior to admission but documentation unclear.    Pt extubated to BiPAP this afternoon. Unable to obtain recent nutrition/weight history at this time. Will provide supplementation once diet is advanced.  Per chart records, pt looks to have weighed 128.3 kg on 3/21 and 136.6 kg this admission. Will need to obtain EDW. Nephrology to see today.   I/O: -2,145 ml since admit UOP: 1350 ml x 24 hrs  OGT: 1400 ml x 24 hrs   Medications reviewed and include:SS novolog, lactulose, NS @ 10 ml/hr, precedex, bi carb Labs reviewed: Na 148 (H) K 2.7 (L) corrected calcium 8.9 (wdl) Phosphorus 10.9 (H) Mg 2.7 (H)   Diet Order:   Diet Order            Diet NPO time specified Except for: Ice Chips  Diet effective now              EDUCATION NEEDS:   Not appropriate for education at this time  Skin:  Skin Assessment: Skin Integrity Issues: Skin Integrity Issues:: Stage I, Other (Comment) Stage I: coccyx Other: non-pressure- coccyx/medial sacrum  Last BM:  3/30- 100 ml  rectal tube  Height:   Ht Readings from Last 1 Encounters:  04/21/18 6' (1.829 m)    Weight:   Wt Readings from Last 1 Encounters:  04/23/18 (!) 136.6 kg    Ideal Body Weight:  80.9 kg  BMI:  Body mass index is 40.84 kg/m.  Estimated Nutritional Needs:   Kcal:  2400-2600 kcal  Protein:  120-140 grams  Fluid:  1000 ml + UOP   Mariana Single RD, LDN Clinical Nutrition Pager # [phone]

## 2018-04-23 NOTE — Progress Notes (Signed)
Patient was not tolerating bipap well. RT decided to try patient on a [place] to see if he would do better with that and be able to maintain his O2 sat. Patient appears to be tolerating well at this time.

## 2018-04-23 NOTE — Progress Notes (Signed)
[place] Progress Note Patient Name: Eddie Davies DOB: 03-Jul-1970 MRN: [id number]   Date of Service  04/23/2018  HPI/Events of Note  Notified of K 2.7, Hgb 6 Creatinine >5  eICU Interventions  Ordered k 40 meqs total Will defer to nephrology further K replacement and blood transfusion as ideally done during dialysis        [name] 04/23/2018, 6:13 AM

## 2018-04-24 DIAGNOSIS — J9690 Respiratory failure, unspecified, unspecified whether with hypoxia or hypercapnia: Secondary | ICD-10-CM

## 2018-04-24 DIAGNOSIS — N184 Chronic kidney disease, stage 4 (severe): Secondary | ICD-10-CM

## 2018-04-24 DIAGNOSIS — K7290 Hepatic failure, unspecified without coma: Secondary | ICD-10-CM

## 2018-04-24 DIAGNOSIS — N179 Acute kidney failure, unspecified: Secondary | ICD-10-CM

## 2018-04-24 LAB — RENAL FUNCTION PANEL
Albumin: 2.1 g/dL — ABNORMAL LOW (ref 3.5–5.0)
Anion gap: 11 (ref 5–15)
BUN: 48 mg/dL — AB (ref 6–20)
CO2: 21 mmol/L — ABNORMAL LOW (ref 22–32)
CREATININE: 4.51 mg/dL — AB (ref 0.61–1.24)
Calcium: 7.8 mg/dL — ABNORMAL LOW (ref 8.9–10.3)
Chloride: 117 mmol/L — ABNORMAL HIGH (ref 98–111)
GFR calc Af Amer: 17 mL/min — ABNORMAL LOW (ref >60)
GFR, EST NON AFRICAN AMERICAN: 14 mL/min — AB (ref >60)
Glucose, Bld: 158 mg/dL — ABNORMAL HIGH (ref 70–99)
Phosphorus: 10.7 mg/dL — ABNORMAL HIGH (ref 2.5–4.6)
Potassium: 3.3 mmol/L — ABNORMAL LOW (ref 3.5–5.1)
Sodium: 149 mmol/L — ABNORMAL HIGH (ref 135–145)

## 2018-04-24 LAB — AMMONIA: Ammonia: 60 umol/L — ABNORMAL HIGH (ref 9–35)

## 2018-04-24 LAB — BPAM RBC
BLOOD PRODUCT EXPIRATION DATE: 202004132359
ISSUE DATE / TIME: 202003301426
Unit Type and Rh: 6200

## 2018-04-24 LAB — TYPE AND SCREEN
ABO/RH(D): A POS
Antibody Screen: NEGATIVE
Unit division: 0

## 2018-04-24 LAB — CBC
HCT: 26.1 % — ABNORMAL LOW (ref 39.0–52.0)
Hemoglobin: 7.6 g/dL — ABNORMAL LOW (ref 13.0–17.0)
MCH: 29 pg (ref 26.0–34.0)
MCHC: 29.1 g/dL — ABNORMAL LOW (ref 30.0–36.0)
MCV: 99.6 fL (ref 80.0–100.0)
Platelets: 144 10*3/uL — ABNORMAL LOW (ref 150–400)
RBC: 2.62 MIL/uL — ABNORMAL LOW (ref 4.22–5.81)
RDW: 17.5 % — ABNORMAL HIGH (ref 11.5–15.5)
WBC: 4.4 10*3/uL (ref 4.0–10.5)
nRBC: 0 % (ref 0.0–0.2)

## 2018-04-24 LAB — CULTURE, RESPIRATORY W GRAM STAIN: Special Requests: NORMAL

## 2018-04-24 LAB — CK: Total CK: 26 U/L — ABNORMAL LOW (ref 49–397)

## 2018-04-24 LAB — GLUCOSE, CAPILLARY: Glucose-Capillary: 136 mg/dL — ABNORMAL HIGH (ref 70–99)

## 2018-04-24 LAB — MAGNESIUM: Magnesium: 2.6 mg/dL — ABNORMAL HIGH (ref 1.7–2.4)

## 2018-04-24 MED ORDER — SODIUM CHLORIDE 0.9% FLUSH
10.0000 mL | Freq: Two times a day (BID) | INTRAVENOUS | Status: DC
  Administered 2018-04-24 – 2018-04-26 (×3): 10 mL
  Administered 2018-04-27: 09:00:00 3 mL
  Administered 2018-04-27 – 2018-05-02 (×4): 10 mL

## 2018-04-24 MED ORDER — SODIUM CHLORIDE 0.9% FLUSH
10.0000 mL | INTRAVENOUS | Status: DC | PRN
  Administered 2018-05-05: 10 mL
  Filled 2018-04-24: qty 40

## 2018-04-24 MED ORDER — POTASSIUM CHLORIDE IN NACL 20-0.45 MEQ/L-% IV SOLN
INTRAVENOUS | Status: DC
  Administered 2018-04-24 (×2): via INTRAVENOUS
  Filled 2018-04-24 (×3): qty 1000

## 2018-04-24 MED ORDER — RIFAXIMIN 550 MG PO TABS
550.0000 mg | ORAL_TABLET | Freq: Two times a day (BID) | ORAL | Status: DC
  Administered 2018-04-24 – 2018-05-05 (×22): 550 mg via ORAL
  Filled 2018-04-24 (×22): qty 1

## 2018-04-24 MED ORDER — SODIUM CHLORIDE 0.9 % IV SOLN
500.0000 mg | INTRAVENOUS | Status: DC
  Administered 2018-04-24 – 2018-04-25 (×2): 500 mg via INTRAVENOUS
  Filled 2018-04-24 (×3): qty 0.5

## 2018-04-24 MED ORDER — SENNOSIDES-DOCUSATE SODIUM 8.6-50 MG PO TABS
1.0000 | ORAL_TABLET | Freq: Two times a day (BID) | ORAL | Status: DC
  Administered 2018-04-24 – 2018-05-05 (×20): 1 via ORAL
  Filled 2018-04-24 (×20): qty 1

## 2018-04-24 MED ORDER — NEPRO/CARBSTEADY PO LIQD
237.0000 mL | Freq: Two times a day (BID) | ORAL | Status: DC
  Administered 2018-04-24 – 2018-05-05 (×21): 237 mL via ORAL

## 2018-04-24 MED ORDER — HYDROCERIN EX CREA
TOPICAL_CREAM | Freq: Two times a day (BID) | CUTANEOUS | Status: DC
  Administered 2018-04-24 – 2018-04-25 (×3): via TOPICAL
  Administered 2018-04-26: 1 via TOPICAL
  Administered 2018-04-26 – 2018-04-28 (×4): via TOPICAL
  Administered 2018-04-28: 1 via TOPICAL
  Administered 2018-04-29 – 2018-05-05 (×10): via TOPICAL
  Filled 2018-04-24: qty 113

## 2018-04-24 MED ORDER — POTASSIUM CHLORIDE 20 MEQ/15ML (10%) PO SOLN: 40.0000 meq | Freq: Once | ORAL | Status: DC

## 2018-04-24 MED ORDER — POTASSIUM CHLORIDE CRYS ER 20 MEQ PO TBCR: 40.0000 meq | EXTENDED_RELEASE_TABLET | Freq: Two times a day (BID) | ORAL | Status: DC

## 2018-04-24 MED ORDER — LACTULOSE 10 GM/15ML PO SOLN
30.0000 g | Freq: Three times a day (TID) | ORAL | Status: DC
  Administered 2018-04-24 – 2018-05-05 (×31): 30 g via ORAL
  Filled 2018-04-24 (×32): qty 45

## 2018-04-24 MED ORDER — POTASSIUM CHLORIDE CRYS ER 20 MEQ PO TBCR
40.0000 meq | EXTENDED_RELEASE_TABLET | Freq: Once | ORAL | Status: AC
  Administered 2018-04-24: 40 meq via ORAL
  Filled 2018-04-24: qty 2

## 2018-04-24 NOTE — Consult Note (Signed)
Consultation Note Date: 04/24/2018   Patient Name: Eddie Davies  DOB: 1970-12-05  MRN: [id number]  Age / Sex: 48 y.o., male  PCP: [facility] Referring Physician: Cherene Altes, MD  Reason for Consultation: Establishing goals of care and Psychosocial/spiritual support  HPI/Patient Profile: 48 y.o. male "Eddie Davies" with past medical history of cognitive delay, liver disease (on lactulose and rifaximin), seizure DO, CKD stage 4 (baseline cr 1.9), DM2  who was admitted on 04/21/2018 with altered mental status requiring intubation for airway protection.  Reportedly he was refusing lactulose at SNF.  He was extubated, and did not tolerate Bipap, but is not stable on room air.  Of note the patient had a recent hospitalization earlier this month for septic shock from klebsiella UTI.  Clinical Assessment and Goals of Care:  I have reviewed medical records including EPIC notes, labs and imaging, received report from the care team, assessed the patient and then conferenced his cousin Eddie Davies on at bedside  to discuss diagnosis prognosis, GOC, disposition and options.  I introduced Palliative Medicine as specialized medical care for people living with serious illness. It focuses on providing relief from the symptoms and stress of a serious illness. The goal is to improve quality of life for both the patient and the family.  We discussed a brief life review of the patient.  Eddie Davies was cared for by his mother.  In March 29, 2015 when his mother died he went to live with his cousin Eddie Davies.  Eddie Davies and his wife cared for Eddie Davies until his medical issues became to great and he went to live at [facility] (less than 1 year ago).  Eddie Davies tells me that Eddie Davies is wheelchair bound but gets around well.  He enjoys eating.  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   Eddie Davies actually has 3 living brothers and 1  sister.  Eddie Davies says they were "adopted out".  Eddie Davies explains that his own wife is now disabled and a great deal of energy goes to caring for her.  He needs for Eddie Davies's brothers and sister to become responsible for making decisions for Eddie Davies if Eddie Davies is unable to do so.   As far as functional and nutritional status at the facility Eddie Davies was able to bear weight but was primarily wheel chair bound.  He ate well.  We discussed his current illness and what it means in the larger context of his on-going co-morbidities.  Natural disease trajectory and expectations at EOL were discussed.  Specifically we talked about ESLD and ESRD.  With recurrent encephalopathy there is a strong need to determine HCPOA and GOC.  I attempted to review a MOST form with Eddie Davies.  He was polite but again pushed me towards calling Eddie Davies's brothers and sister (that have not seen him in 3 years) in order to designate a responsible medical surrogate.  Eddie Davies also encourages me to have Eddie Davies make his own decisions - I am hesitant to do so as I am uncertain  Eddie Davies will understand the complexity of his choices.  I did speak with Eddie Davies briefly about hemodialysis.  He replied "Well, I would do what ever I have to do".  He did go on to tell me he felt he was stupid for coming back into the hospital and he does not like being here.   Primary Decision Maker:  OTHER undetermined.    SUMMARY OF RECOMMENDATIONS    We need to determine that the patient can make his own decisions or find an appropriate medical decision making surrogate For now he is full scope full code.   Eddie Davies is deferring decision making to the patient himself or his siblings. Eddie Davies gave me Eddie Davies phone number (Eddie Davies's sister) [phone].  I will reach out to her.  Per Eddie Davies the patient has been having bleeding with his stools but has not had a GI evaluation.  Eddie Davies was concerned about the patient's bilateral great toe wounds.  Code Status/Advance Care Planning:  Full    Symptom Management:   Add Eucerin for dry skin body wide.   Palliative Prophylaxis:   Frequent Pain Assessment  Psycho-social/Spiritual:   Desire for further Chaplaincy support: welcomed.  Prognosis:  Unable to determine.  Patient is full scope full code.   Discharge Planning: [facility] for rehab with Palliative care service follow-up      Primary Diagnoses: Present on Admission: . HCAP (healthcare-associated pneumonia)   I have reviewed the medical record, interviewed the patient and family, and examined the patient. The following aspects are pertinent.  No past medical history on file. Social History   Socioeconomic History  . Marital status: Single    Spouse name: Not on file  . Number of children: Not on file  . Years of education: Not on file  . Highest education level: Not on file  Occupational History  . Not on file  Social Needs  . Financial resource strain: Not on file  . Food insecurity:    Worry: Not on file    Inability: Not on file  . Transportation needs:    Medical: Not on file    Non-medical: Not on file  Tobacco Use  . Smoking status: Unknown If Ever Smoked  . Smokeless tobacco: Never Used  Substance and Sexual Activity  . Alcohol use: Not on file  . Drug use: Not on file  . Sexual activity: Not on file  Lifestyle  . Physical activity:    Days per week: Not on file    Minutes per session: Not on file  . Stress: Not on file  Relationships  . Social connections:    Talks on phone: Not on file    Gets together: Not on file    Attends religious service: Not on file    Active member of club or organization: Not on file    Attends meetings of clubs or organizations: Not on file    Relationship status: Not on file  Other Topics Concern  . Not on file  Social History Narrative  . Not on file   No family history on file. Scheduled Meds: . chlorhexidine gluconate (MEDLINE KIT)  15 mL Mouth Rinse BID  . feeding  supplement (NEPRO CARB STEADY)  237 mL Oral BID BM  . lactulose  30 g Per Tube TID  . mouth rinse  15 mL Mouth Rinse QID  . multivitamin  1 tablet Oral QHS  . phenobarbital  97.2 mg Oral QHS  . potassium chloride  40 mEq Oral Once  . rifaximin  550 mg Per Tube BID  . sodium chloride flush  10-40 mL Intracatheter Q12H   Continuous Infusions: . 0.45 % NaCl with KCl 20 mEq / L 75 mL/hr at 04/24/18 1204  . sodium chloride Stopped (04/24/18 0211)  . meropenem (MERREM) IV 500 mg (04/24/18 1206)   PRN Meds:.docusate, sodium chloride flush Allergies  Allergen Reactions  . Fish Oil Swelling and Other (See Comments)        Review of Systems diarrhea, generalized pain, no other complaints.  Physical Exam  Well developed obese gentleman who repeatedly asks me for cups of water, Skin is dry and scaling on all visible skin surfaces. Awake, alert, NAD, speech is clear.  Patient is somewhat child like, but cooperative  Vital Signs: BP 125/72 (BP Location: Left Arm)   Pulse 71   Temp (!) 97.5 F (36.4 C) (Oral)   Resp 16   Ht 6' (1.829 m) Comment: per pt and measurement verification RNs present  Wt (!) 136.6 kg   SpO2 100%   BMI 40.84 kg/m  Pain Scale: 0-10   Pain Score: 0-No pain   SpO2: SpO2: 100 % O2 Device:SpO2: 100 % O2 Flow Rate: .O2 Flow Rate (L/min): 3 L/min  IO: Intake/output summary:   Intake/Output Summary (Last 24 hours) at 04/24/2018 1519 Last data filed at 04/24/2018 0615 Gross per 24 hour  Intake 1369.06 ml  Output 625 ml  Net 744.06 ml    LBM: Last BM Date: 04/24/18 Baseline Weight: Weight: 130.6 kg Most recent weight: Weight: (!) 136.6 kg     Palliative Assessment/Data: 30%     Time In: 3:30 Time Out: 4:40 Time Total: 70 min. Greater than 50%  of this time was spent counseling and coordinating care related to the above assessment and plan.  Signed by: Florentina Jenny, [name] Palliative Medicine Pager: [phone]  Please contact Palliative  Medicine Team phone at [phone] for questions and concerns.  For individual provider: See Shea Evans

## 2018-04-24 NOTE — Evaluation (Signed)
Clinical/Bedside Swallow Evaluation Patient Details  Name: Eddie Davies MRN: [id number] Date of Birth: Nov 29, 1970  Today's Date: 04/24/2018 Time: SLP Start Time (ACUTE ONLY): 0912 SLP Stop Time (ACUTE ONLY): 0924 SLP Time Calculation (min) (ACUTE ONLY): 12 min  Past Medical History: No past medical history on file. Past Surgical History: The histories are not reviewed yet. Please review them in the "History" navigator section and refresh this Elizabeth. HPI:  48 yo male with h/o dm, seizures, htn, esld, esrd ckd stage IV presents as transfer from [facility] for [place] requiring intubation 3/28-3/30.  The pt had an admission 3/14- 3/21 for encephalopathy with subsequent intubation, recommended for regular solids/thin liquids by SLP at that time. Per chart, he has reportedly been refusing dialysis and meds for ESLD at SNF.   Assessment / Plan / Recommendation Clinical Impression  Pt's swallow appears functional with no overt signs of aspiration. He is impulsive with intake, even trying to open packages of dry swabs with his mouth because he says he "needs something." Full supervision would be appropriate during PO intake given impulsivity and cognitive status, but he does not appear to need any texture restrictions. Would start regular solids and thin liquids. SLP will f/u briefly for tolerance given the above and recent (but brief) intubation. SLP Visit Diagnosis: Dysphagia, unspecified (R13.10)    Aspiration Risk  Mild aspiration risk    Diet Recommendation Regular;Thin liquid   Liquid Administration via: Cup;Straw Medication Administration: Whole meds with liquid Supervision: Patient able to self feed;Full supervision/cueing for compensatory strategies Compensations: Minimize environmental distractions;Slow rate;Small sips/bites Postural Changes: Seated upright at 90 degrees    Other  Recommendations Oral Care Recommendations: Oral care BID   Follow up Recommendations Skilled  Nursing facility      Frequency and Duration min 1 x/week  1 week       Prognosis Prognosis for Safe Diet Advancement: Good Barriers to Reach Goals: Cognitive deficits      Swallow Study   General HPI: 48 yo male with h/o dm, seizures, htn, esld, esrd ckd stage IV presents as transfer from [facility] for [place] requiring intubation 3/28-3/30.  The pt had an admission 3/14- 3/21 for encephalopathy with subsequent intubation, recommended for regular solids/thin liquids by SLP at that time. Per chart, he has reportedly been refusing dialysis and meds for ESLD at SNF. Type of Study: Bedside Swallow Evaluation Previous Swallow Assessment: see HPI Diet Prior to this Study: NPO Temperature Spikes Noted: No Respiratory Status: Nasal cannula History of Recent Intubation: Yes Length of Intubations (days): 2 days Date extubated: 04/23/18 Behavior/Cognition: Alert;Cooperative;Impulsive;Requires cueing Oral Cavity Assessment: Within Functional Limits Oral Care Completed by SLP: No Oral Cavity - Dentition: Missing dentition Vision: Functional for self-feeding Self-Feeding Abilities: Able to feed self Patient Positioning: Upright in bed(lean to R side) Baseline Vocal Quality: Normal Volitional Cough: Weak Volitional Swallow: Able to elicit    Oral/Motor/Sensory Function Overall Oral Motor/Sensory Function: Within functional limits   Ice Chips Ice chips: Not tested   Thin Liquid Thin Liquid: Within functional limits Presentation: Self Fed;Straw    Nectar Thick Nectar Thick Liquid: Not tested   Honey Thick Honey Thick Liquid: Not tested   Puree Puree: Within functional limits Presentation: Spoon   Solid     Solid: Within functional limits Presentation: [place] Ivori Storr 04/24/2018,9:31 AM   [name], M.A. [place] Acute Environmental education officer [phone] Office [phone]

## 2018-04-24 NOTE — Evaluation (Signed)
Physical Therapy Evaluation Patient Details Name: Eddie Davies MRN: [id number] DOB: 02-07-1970 Today's Date: 04/24/2018   History of Present Illness  Patient is a 48 y/o male who presents from [facility] with AMS and acute respiratory failure with hypoxemia; intubated 3/28- 3/30. Recent admission 3/14-3/21 with septic shock from complicated UTI. CXR- RUL infiltrate. PMH includes dm, seizures, htn, esld, esrd ckd stage IV, cognitive delays.   Clinical Impression  Patient presents with generalized weakness, impaired cognition- attention, awareness, safety, slow processing, problem solving and impaired mobility s/p above. Tolerated sitting EOB and standing with assist of 2 due to weakness. Pt with slow processing and response time to questions. Difficult historian so unsure of pt's PLOF/history. Per chart, pt from [facility] and pt reports using w/c for mobility PTA. Will follow acutely to maximize independence and mobility prior to return home. Would benefit from return to SNF.     Follow Up Recommendations SNF;Supervision for mobility/OOB    Equipment Recommendations  Other (comment)(defer to SNF)    Recommendations for Other Services       Precautions / Restrictions Precautions Precautions: Fall Restrictions Weight Bearing Restrictions: No      Mobility  Bed Mobility Overal bed mobility: Needs Assistance Bed Mobility: Sit to Supine;Rolling;Sidelying to Sit Rolling: Min guard Sidelying to sit: Mod assist;HOB elevated   Sit to supine: Min guard;HOB elevated   General bed mobility comments: Able to roll onto side and use rail; assist to elevate trunk to get to EOB. Increased effort. No dizziness.   Transfers Overall transfer level: Needs assistance Equipment used: Rolling walker (2 wheeled) Transfers: Sit to/from Stand Sit to Stand: Max assist;+2 physical assistance;From elevated surface         General transfer comment: Assist to power to standing with cues for use  of momentum and hand placement; on first attempt no initiation noted. Stood up on second attempt. Cues for upright, flexed trunk.  Ambulation/Gait             General Gait Details: Declined sidestepping along side bed  Stairs            Wheelchair Mobility    Modified Rankin (Stroke Patients Only)       Balance Overall balance assessment: Needs assistance Sitting-balance support: Feet supported;No upper extremity supported Sitting balance-Leahy Scale: Fair Sitting balance - Comments: Able to tolerate sitting EOB unsupported, not able to donn socks- total A. Posterior lean noted with scooting bottom for repositioning without LOB.   Standing balance support: During functional activity;Bilateral upper extremity supported Standing balance-Leahy Scale: Poor Standing balance comment: Requires BUE support and external support in standing. Limited tolerance.                              Pertinent Vitals/Pain Pain Assessment: Faces Faces Pain Scale: Hurts little more Pain Location: generalized with movement Pain Descriptors / Indicators: Grimacing Pain Intervention(s): Monitored during session    Home Living Family/patient expects to be discharged to:: Skilled nursing facility                 Additional Comments: From [facility]- likely residence per chart?    Prior Function Level of Independence: Needs assistance   Gait / Transfers Assistance Needed: USes w/c for mobility and reports transferring without assist. Likely needs help with transfers.  ADL's / Homemaking Assistance Needed: Per report, pt gets assist at times with ADls.  Hand Dominance        Extremity/Trunk Assessment   Upper Extremity Assessment Upper Extremity Assessment: (Limited shoulder elevation bilaterally)    Lower Extremity Assessment Lower Extremity Assessment: Generalized weakness(Grossly ~3/5 throughout- able to lift against gravity)       Communication    Communication: Expressive difficulties(slow response time)  Cognition Arousal/Alertness: Awake/alert Behavior During Therapy: Flat affect Overall Cognitive Status: No family/caregiver present to determine baseline cognitive functioning Area of Impairment: Orientation;Memory;Following commands;Problem solving;Safety/judgement;Attention                 Orientation Level: Disoriented to;Situation("I had an accident") Current Attention Level: Sustained Memory: Decreased short-term memory Following Commands: Follows one step commands with increased time(repetition needed) Safety/Judgement: Decreased awareness of deficits   Problem Solving: Slow processing;Difficulty sequencing;Decreased initiation;Requires verbal cues;Requires tactile cues General Comments: Initially sleeping on arrival but arouses well. Slow response time to questions asked- does better with options.       General Comments General comments (skin integrity, edema, etc.): Pt perservating on wanting glass of water; able to be redirected to task. Scaley skin throughout extremities and face.    Exercises     Assessment/Plan    PT Assessment Patient needs continued PT services  PT Problem List Decreased strength;Decreased balance;Decreased cognition;Obesity;Decreased mobility;Decreased range of motion;Decreased activity tolerance;Decreased safety awareness;Decreased skin integrity       PT Treatment Interventions DME instruction;Functional mobility training;Balance training;Patient/family education;Gait training;Therapeutic activities;Wheelchair mobility training;Cognitive remediation;Therapeutic exercise    PT Goals (Current goals can be found in the Care Plan section)  Acute Rehab PT Goals Patient Stated Goal: to get some water PT Goal Formulation: With patient Time For Goal Achievement: 05/08/18 Potential to Achieve Goals: Fair    Frequency Min 2X/week   Barriers to discharge        Co-evaluation                AM-PAC PT "6 Clicks" Mobility  Outcome Measure Help needed turning from your back to your side while in a flat bed without using bedrails?: A Little Help needed moving from lying on your back to sitting on the side of a flat bed without using bedrails?: A Lot Help needed moving to and from a bed to a chair (including a wheelchair)?: Total Help needed standing up from a chair using your arms (e.g., wheelchair or bedside chair)?: A Lot Help needed to walk in hospital room?: Total Help needed climbing 3-5 steps with a railing? : Total 6 Click Score: 10    End of Session Equipment Utilized During Treatment: Gait belt Activity Tolerance: Patient tolerated treatment well Patient left: in bed;with call bell/phone within reach;with bed alarm set Nurse Communication: Mobility status;Other (comment)(wants water) PT Visit Diagnosis: Muscle weakness (generalized) (M62.81);Difficulty in walking, not elsewhere classified (R26.2);Unsteadiness on feet (R26.81)    Time: 7543-6067 PT Time Calculation (min) (ACUTE ONLY): 23 min   Charges:   PT Evaluation $PT Eval Moderate Complexity: 1 Mod PT Treatments $Therapeutic Activity: 8-22 mins        Wray Kearns, PT, DPT Acute Rehabilitation Services Pager [phone] Office [phone]      Marguarite Arbour A Sabra Heck 04/24/2018, 3:33 PM

## 2018-04-24 NOTE — Progress Notes (Signed)
Called by RN to assessed pt CVC and introducer that's out ,dressing with vaseline gauze applied.

## 2018-04-24 NOTE — Progress Notes (Signed)
Subjective: Interval History: has complaints thirsty.  Objective: Vital signs in last 24 hours: Temp:  [96.7 F (35.9 C)-98.4 F (36.9 C)] 97.5 F (36.4 C) (03/31 0617) Pulse Rate:  [45-73] 71 (03/31 0617) Resp:  [7-20] 16 (03/31 0617) BP: (100-131)/(52-77) 125/72 (03/31 0617) SpO2:  [95 %-100 %] 100 % (03/31 0617) FiO2 (%):  [40 %] 40 % (03/30 1432) Weight change:   Intake/Output from previous day: 03/30 0701 - 03/31 0700 In: 2167.6 [I.V.:1240; Blood:436; IV Piggyback:491.6] Out: 1575 [Urine:975; Emesis/NG output:600] Intake/Output this shift: No intake/output data recorded.  General appearance: cooperative, morbidly obese, pale and slowed mentation Resp: diminished breath sounds bilaterally Cardio: S1, S2 normal and systolic murmur: systolic ejection 2/6, crescendo and decrescendo at 2nd left intercostal space GI: obese, pos bs, soft Extremities: edema 1+  Lab Results: Recent Labs    04/23/18 0435 04/23/18 1724 04/24/18 0258  WBC 4.6  --  4.4  HGB 6.0* 7.7* 7.6*  HCT 20.4* 26.4* 26.1*  PLT 140*  --  144*   BMET:  Recent Labs    04/23/18 1724 04/24/18 0258  NA 148* 149*  K 3.4* 3.3*  CL 118* 117*  CO2 20* 21*  GLUCOSE 126* 158*  BUN 49* 48*  CREATININE 4.95* 4.51*  CALCIUM 7.7* 7.8*   No results for input(s): PTH in the last 72 hours. Iron Studies: No results for input(s): IRON, TIBC, TRANSFERRIN, FERRITIN in the last 72 hours.  Studies/Results: Dg Chest Port 1 View  Result Date: 04/23/2018 CLINICAL DATA:  Intubated patient admitted 04/21/2018 with altered mental status. EXAM: PORTABLE CHEST 1 VIEW COMPARISON:  Single-view of the chest 04/21/2018 and 04/12/2018. FINDINGS: Right IJ catheter tip projects in the right subclavian vein, unchanged. NG tube courses into the stomach and below the inferior margin the film. Endotracheal tube tip projects at the thoracic inlet and should be advanced approximately 3 cm. Cardiomegaly is again seen. Bilateral airspace  disease persists but aeration in the right upper lung zone has improved. IMPRESSION: ETT tip projects at the thoracic inlet. The tube should be advanced approximately 3 cm. Bilateral airspace disease persist but aeration in the right upper lobe is markedly improved. Electronically Signed   By: Inge Rise M.D.   On: 04/23/2018 07:51    I have reviewed the patient's current medications.  Assessment/Plan: 1 AKI/CKD 3  UTI, low bps. Now recovering, mild acidemia, low K, ^ SNA. Needs K, free water and volume. 2 Cognitive delays 3 Obesity 4 DM  5 Liver dz on lact/Rif 6 ? Pneu P iv 1/2, K , lactulose,    LOS: 3 days   Eddie Davies 04/24/2018,10:37 AM

## 2018-04-24 NOTE — Progress Notes (Signed)
[place] TEAM 1 - Stepdown/ICU TEAM  KA FLAMMER  IRJ:[id number] DOB: 04/06/1970 DOA: 04/21/2018 PCP: [facility]    Brief Narrative:  48yo w/ a hx of seizures, HTN, ESLD, and AKI on CKD Stage 4 who was transferred from [facility] for [place] requiring intubation. The patient had an admission 3/14- 3/21 for encephalopathy with subsequent intubation.  He required abx for complicated UTI, CRRT for dialysis while hypotensive, lactulose, rifaximin for ESLD, and was ultimately d/c to snf. Per notes from his stay at [facility] he had recently refused dialsysis and medications for ESLD. He was altered in the ED required intubated for airway protection. CT head was normal. Ammonia level 33.   Subjective: Pt is alert and conversant, though very slow to respond to any questions. He denies pain. He is in no apparent resp distress.   Assessment & Plan:  Acute encephalopathy  etiology unclear - ammonia level 33 - UDS negative - baseline not entirely clear to me at this time - check T01 and folic acid   ESBL Klebsiella HCAP - Acute hypoxic resp failure  Extubated 3/30 - stable on [place] alone at this time - abx tx adjusted today given ESBL status   AKI on CKD stage 3 Baseline crt 1.9 - Nephrology has evaluated - crt slowly improving   Recent Labs  Lab 04/21/18 2209 04/22/18 1802 04/23/18 0435 04/23/18 1724 04/24/18 0258  CREATININE 6.90* 6.39* 5.70* 4.95* 4.51*    Acute on chronic Anemia Asymptomatic & normotensive w/ no signs of bleeding - follow trend   Recent Labs  Lab 04/22/18 1839 04/23/18 0435 04/23/18 1724 04/24/18 0258  HGB 8.8* 6.0* 7.7* 7.6*    ESLD stable LFTs - cont  lactulose and rifaximin - ammonia vacillating, but not clear it is tracking exactly w/ mental status fluctuations   Seizures continue phenobarbital  DM CBG controlled at this time    GERD Cont PPI  Obesity - Body mass index is 40.84 kg/m.  DVT prophylaxis: SCDs Code  Status: FULL CODE Family Communication: no family present at time of exam  Disposition Plan:   Consultants:  PCCM Nephrology  Antimicrobials:  3/28 cefepime > 3/28 vanc >  Objective: Blood pressure 125/72, pulse 71, temperature (!) 97.5 F (36.4 C), temperature source Oral, resp. rate 16, height 6' (1.829 m), weight (!) 136.6 kg, SpO2 100 %.  Intake/Output Summary (Last 24 hours) at 04/24/2018 1155 Last data filed at 04/24/2018 0615 Gross per 24 hour  Intake 1799.33 ml  Output 1575 ml  Net 224.33 ml   Filed Weights   04/22/18 0000 04/23/18 0000  Weight: 130.6 kg (!) 136.6 kg    Examination: General: No acute respiratory distress Lungs: Clear to auscultation bilaterally without wheezes or crackles Cardiovascular: Regular rate and rhythm without murmur gallop or rub normal S1 and S2 Abdomen: Nontender, nondistended, soft, bowel sounds positive, no rebound, no ascites, no appreciable mass Extremities: No significant cyanosis, clubbing, or edema bilateral lower extremities  CBC: Recent Labs  Lab 04/21/18 2209  04/22/18 1802  04/23/18 0435 04/23/18 1724 04/24/18 0258  WBC 5.6  --  6.5  --  4.6  --  4.4  NEUTROABS 4.3  --   --   --   --   --   --   HGB 7.3*   < > 6.7*   < > 6.0* 7.7* 7.6*  HCT 25.4*   < > 22.6*   < > 20.4* 26.4* 26.1*  MCV 99.6  --  98.7  --  99.5  --  99.6  PLT 132*  --  148*  --  140*  --  144*   < > = values in this interval not displayed.   Basic Metabolic Panel: Recent Labs  Lab 04/23/18 0435 04/23/18 1724 04/24/18 0258  NA 148* 148* 149*  K 2.7* 3.4* 3.3*  CL 120* 118* 117*  CO2 18* 20* 21*  GLUCOSE 143* 126* 158*  BUN 50* 49* 48*  CREATININE 5.70* 4.95* 4.51*  CALCIUM 7.3* 7.7* 7.8*  MG 2.7*  --  2.6*  PHOS 10.9*  --  10.7*   GFR: Estimated Creatinine Clearance: 29 mL/min (A) (by C-G formula based on SCr of 4.51 mg/dL (H)).  Liver Function Tests: Recent Labs  Lab 04/21/18 2209 04/23/18 0435 04/24/18 0258  AST 7* 11*  --    ALT 9 7  --   ALKPHOS 121 109  --   BILITOT 0.3 0.9  --   PROT 5.7* 5.0*  --   ALBUMIN 2.3* 2.0* 2.1*    Recent Labs  Lab 04/21/18 2209 04/22/18 1802 04/24/18 0258  AMMONIA 35 37* 60*     HbA1C: Hgb A1c MFr Bld  Date/Time Value Ref Range Status  04/07/2018 07:48 PM 8.3 (H) 4.8 - 5.6 % Final    Comment:    (NOTE) Pre diabetes:          5.7%-6.4% Diabetes:              >6.4% Glycemic control for   <7.0% adults with diabetes     CBG: Recent Labs  Lab 04/23/18 0328 04/23/18 0754 04/23/18 0939 04/23/18 1207 04/23/18 1556  GLUCAP 135* 136* 140* 122* 104*    Recent Results (from the past 240 hour(s))  MRSA PCR Screening     Status: None   Collection Time: 04/21/18  9:33 PM  Result Value Ref Range Status   MRSA by PCR NEGATIVE NEGATIVE Final    Comment:        The GeneXpert MRSA Assay (FDA approved for NASAL specimens only), is one component of a comprehensive MRSA colonization surveillance program. It is not intended to diagnose MRSA infection nor to guide or monitor treatment for MRSA infections. Performed at [facility], [place] [address] [place], [place] [address]   Culture, blood (routine x 2) Call MD if unable to obtain prior to antibiotics being given     Status: None (Preliminary result)   Collection Time: 04/21/18 11:24 PM  Result Value Ref Range Status   Specimen Description BLOOD RIGHT ANTECUBITAL  Final   Special Requests   Final    BOTTLES DRAWN AEROBIC AND ANAEROBIC Blood Culture adequate volume   Culture   Final    NO GROWTH 2 DAYS Performed at [facility], [place] [address] [place], [place] [address]    Report Status PENDING  Incomplete  Culture, blood (routine x 2) Call MD if unable to obtain prior to antibiotics being given     Status: None (Preliminary result)   Collection Time: 04/21/18 11:24 PM  Result Value Ref Range Status   Specimen Description BLOOD RIGHT ANTECUBITAL  Final   Special Requests   Final    BOTTLES DRAWN  AEROBIC AND ANAEROBIC Blood Culture adequate volume   Culture   Final    NO GROWTH 2 DAYS Performed at [facility], [place] [address], [place], [place] [address]    Report Status PENDING  Incomplete  Culture, sputum-assessment  Status: None   Collection Time: 04/22/18 10:27 AM  Result Value Ref Range Status   Specimen Description TRACHEAL ASPIRATE  Final   Special Requests Normal  Final   Sputum evaluation   Final    THIS SPECIMEN IS ACCEPTABLE FOR SPUTUM CULTURE Performed at [facility], [place] [address] [place], [place] [address]    Report Status 04/22/2018 FINAL  Final  Culture, respiratory     Status: None   Collection Time: 04/22/18 10:27 AM  Result Value Ref Range Status   Specimen Description TRACHEAL ASPIRATE  Final   Special Requests Normal Reflexed from [id number]  Final   Gram Stain   Final    FEW WBC PRESENT, PREDOMINANTLY PMN MODERATE GRAM NEGATIVE RODS RARE BUDDING YEAST SEEN Performed at [facility], [place] [address], [place], [place] [address]    Culture   Final    ABUNDANT KLEBSIELLA PNEUMONIAE Confirmed Extended Spectrum Beta-Lactamase Producer (ESBL).  In bloodstream infections from ESBL organisms, carbapenems are preferred over piperacillin/tazobactam. They are shown to have a lower risk of mortality.    Report Status 04/24/2018 FINAL  Final   Organism ID, Bacteria KLEBSIELLA PNEUMONIAE  Final      Susceptibility   Klebsiella pneumoniae - MIC*    AMPICILLIN >=32 RESISTANT Resistant     CEFAZOLIN >=64 RESISTANT Resistant     CEFEPIME RESISTANT Resistant     CEFTAZIDIME RESISTANT Resistant     CEFTRIAXONE RESISTANT Resistant     CIPROFLOXACIN 1 SENSITIVE Sensitive     GENTAMICIN 8 INTERMEDIATE Intermediate     IMIPENEM <=0.25 SENSITIVE Sensitive     TRIMETH/SULFA >=320 RESISTANT Resistant     AMPICILLIN/SULBACTAM 8 SENSITIVE Sensitive     PIP/TAZO <=4 SENSITIVE Sensitive     Extended ESBL POSITIVE Resistant     * ABUNDANT KLEBSIELLA  PNEUMONIAE  Culture, Urine     Status: Abnormal (Preliminary result)   Collection Time: 04/23/18 11:10 AM  Result Value Ref Range Status   Specimen Description URINE, CATHETERIZED  Final   Special Requests   Final    NONE Performed at [facility], [address] [place], [place] [address]    Culture (A)  Final    10,000 COLONIES/mL GRAM NEGATIVE RODS 10,000 COLONIES/mL YEAST    Report Status PENDING  Incomplete     Scheduled Meds: . chlorhexidine gluconate (MEDLINE KIT)  15 mL Mouth Rinse BID  . feeding supplement (NEPRO CARB STEADY)  237 mL Oral BID BM  . lactulose  30 g Per Tube TID  . mouth rinse  15 mL Mouth Rinse QID  . multivitamin  1 tablet Oral QHS  . phenobarbital  97.2 mg Oral QHS  . potassium chloride  40 mEq Oral Once  . rifaximin  550 mg Per Tube BID  . sodium chloride flush  10-40 mL Intracatheter Q12H   Continuous Infusions: . 0.45 % NaCl with KCl 20 mEq / L    . sodium chloride Stopped (04/24/18 0211)  . meropenem (MERREM) IV       LOS: 3 days   Cherene Altes, MD [facility] Office  [phone] Pager - Text Page per Amion  If 7PM-7AM, please contact night-coverage per Amion 04/24/2018, 11:55 AM

## 2018-04-25 DIAGNOSIS — N170 Acute kidney failure with tubular necrosis: Secondary | ICD-10-CM

## 2018-04-25 DIAGNOSIS — K7460 Unspecified cirrhosis of liver: Secondary | ICD-10-CM

## 2018-04-25 DIAGNOSIS — R401 Stupor: Secondary | ICD-10-CM

## 2018-04-25 DIAGNOSIS — E872 Acidosis, unspecified: Secondary | ICD-10-CM

## 2018-04-25 HISTORY — DX: Acidosis, unspecified: E87.20

## 2018-04-25 HISTORY — DX: Unspecified cirrhosis of liver: K74.60

## 2018-04-25 HISTORY — DX: Acidosis: E87.2

## 2018-04-25 LAB — BASIC METABOLIC PANEL
Anion gap: 11 (ref 5–15)
BUN: 46 mg/dL — ABNORMAL HIGH (ref 6–20)
CO2: 16 mmol/L — ABNORMAL LOW (ref 22–32)
Calcium: 7.6 mg/dL — ABNORMAL LOW (ref 8.9–10.3)
Chloride: 112 mmol/L — ABNORMAL HIGH (ref 98–111)
Creatinine, Ser: 3.48 mg/dL — ABNORMAL HIGH (ref 0.61–1.24)
GFR calc Af Amer: 23 mL/min — ABNORMAL LOW (ref >60)
GFR calc non Af Amer: 20 mL/min — ABNORMAL LOW (ref >60)
Glucose, Bld: 432 mg/dL — ABNORMAL HIGH (ref 70–99)
Potassium: 3 mmol/L — ABNORMAL LOW (ref 3.5–5.1)
Sodium: 139 mmol/L (ref 135–145)

## 2018-04-25 LAB — URINE CULTURE: Culture: 10000 — AB

## 2018-04-25 LAB — COMPREHENSIVE METABOLIC PANEL
ALT: 9 U/L (ref 0–44)
AST: 11 U/L — ABNORMAL LOW (ref 15–41)
Albumin: 2.2 g/dL — ABNORMAL LOW (ref 3.5–5.0)
Alkaline Phosphatase: 118 U/L (ref 38–126)
Anion gap: 16 — ABNORMAL HIGH (ref 5–15)
BUN: 45 mg/dL — ABNORMAL HIGH (ref 6–20)
CO2: 13 mmol/L — ABNORMAL LOW (ref 22–32)
CREATININE: 3.86 mg/dL — AB (ref 0.61–1.24)
Calcium: 7.5 mg/dL — ABNORMAL LOW (ref 8.9–10.3)
Chloride: 110 mmol/L (ref 98–111)
GFR calc Af Amer: 20 mL/min — ABNORMAL LOW (ref >60)
GFR calc non Af Amer: 17 mL/min — ABNORMAL LOW (ref >60)
Glucose, Bld: 468 mg/dL — ABNORMAL HIGH (ref 70–99)
Potassium: 3.4 mmol/L — ABNORMAL LOW (ref 3.5–5.1)
Sodium: 139 mmol/L (ref 135–145)
Total Bilirubin: 1.2 mg/dL (ref 0.3–1.2)
Total Protein: 5.6 g/dL — ABNORMAL LOW (ref 6.5–8.1)

## 2018-04-25 LAB — GLUCOSE, CAPILLARY
Glucose-Capillary: 507 mg/dL (ref 70–99)
Glucose-Capillary: 489 mg/dL — ABNORMAL HIGH (ref 70–99)
Glucose-Capillary: 348 mg/dL — ABNORMAL HIGH (ref 70–99)
Glucose-Capillary: 358 mg/dL — ABNORMAL HIGH (ref 70–99)

## 2018-04-25 LAB — CBC
HEMATOCRIT: 25.5 % — AB (ref 39.0–52.0)
Hemoglobin: 7.8 g/dL — ABNORMAL LOW (ref 13.0–17.0)
MCH: 30 pg (ref 26.0–34.0)
MCHC: 30.6 g/dL (ref 30.0–36.0)
MCV: 98.1 fL (ref 80.0–100.0)
Platelets: 160 10*3/uL (ref 150–400)
RBC: 2.6 MIL/uL — ABNORMAL LOW (ref 4.22–5.81)
RDW: 16.8 % — ABNORMAL HIGH (ref 11.5–15.5)
WBC: 4 10*3/uL (ref 4.0–10.5)
nRBC: 0 % (ref 0.0–0.2)

## 2018-04-25 LAB — PHOSPHORUS: Phosphorus: 10.2 mg/dL — ABNORMAL HIGH (ref 2.5–4.6)

## 2018-04-25 LAB — FOLATE: Folate: 10 ng/mL (ref >5.9)

## 2018-04-25 LAB — VITAMIN B12: Vitamin B-12: 567 pg/mL (ref 180–914)

## 2018-04-25 MED ORDER — TAMSULOSIN HCL 0.4 MG PO CAPS
0.4000 mg | ORAL_CAPSULE | Freq: Every day | ORAL | Status: DC
  Administered 2018-04-25 – 2018-05-04 (×10): 0.4 mg via ORAL
  Filled 2018-04-25 (×10): qty 1

## 2018-04-25 MED ORDER — INSULIN GLARGINE 100 UNIT/ML ~~LOC~~ SOLN
10.0000 [IU] | Freq: Two times a day (BID) | SUBCUTANEOUS | Status: DC
  Administered 2018-04-25 (×2): 10 [IU] via SUBCUTANEOUS
  Filled 2018-04-25 (×6): qty 0.1

## 2018-04-25 MED ORDER — SODIUM CHLORIDE 0.9 % IV SOLN
1.0000 g | Freq: Two times a day (BID) | INTRAVENOUS | Status: DC
  Administered 2018-04-25 – 2018-04-28 (×6): 1 g via INTRAVENOUS
  Filled 2018-04-25 (×8): qty 1

## 2018-04-25 MED ORDER — POTASSIUM CHLORIDE CRYS ER 20 MEQ PO TBCR
40.0000 meq | EXTENDED_RELEASE_TABLET | Freq: Once | ORAL | Status: AC
  Administered 2018-04-25: 40 meq via ORAL
  Filled 2018-04-25: qty 2

## 2018-04-25 MED ORDER — INSULIN ASPART 100 UNIT/ML ~~LOC~~ SOLN
0.0000 [IU] | Freq: Three times a day (TID) | SUBCUTANEOUS | Status: DC
  Administered 2018-04-25: 7 [IU] via SUBCUTANEOUS
  Administered 2018-04-25: 9 [IU] via SUBCUTANEOUS

## 2018-04-25 MED ORDER — CALCIUM ACETATE (PHOS BINDER) 667 MG PO CAPS
1334.0000 mg | ORAL_CAPSULE | Freq: Three times a day (TID) | ORAL | Status: DC
  Administered 2018-04-25 – 2018-05-05 (×29): 1334 mg via ORAL
  Filled 2018-04-25 (×31): qty 2

## 2018-04-25 MED ORDER — SODIUM BICARBONATE 650 MG PO TABS
650.0000 mg | ORAL_TABLET | Freq: Four times a day (QID) | ORAL | Status: DC
  Administered 2018-04-25 (×4): 650 mg via ORAL
  Filled 2018-04-25 (×4): qty 1

## 2018-04-25 MED ORDER — CARBAMAZEPINE 200 MG PO TABS
600.0000 mg | ORAL_TABLET | Freq: Two times a day (BID) | ORAL | Status: DC
  Administered 2018-04-25 – 2018-05-05 (×21): 600 mg via ORAL
  Filled 2018-04-25 (×22): qty 3

## 2018-04-25 MED ORDER — INSULIN ASPART 100 UNIT/ML ~~LOC~~ SOLN
8.0000 [IU] | Freq: Once | SUBCUTANEOUS | Status: AC
  Administered 2018-04-25: 8 [IU] via SUBCUTANEOUS

## 2018-04-25 MED ORDER — SODIUM BICARBONATE 8.4 % IV SOLN
INTRAVENOUS | Status: DC
  Administered 2018-04-25 – 2018-04-26 (×2): via INTRAVENOUS
  Filled 2018-04-25 (×4): qty 100

## 2018-04-25 MED ORDER — HEPARIN SODIUM (PORCINE) 5000 UNIT/ML IJ SOLN
5000.0000 [IU] | Freq: Three times a day (TID) | INTRAMUSCULAR | Status: DC
  Administered 2018-04-25 – 2018-05-04 (×28): 5000 [IU] via SUBCUTANEOUS
  Filled 2018-04-25 (×30): qty 1

## 2018-04-25 NOTE — Progress Notes (Addendum)
Daily Progress Note   Patient Name: Eddie Davies       Date: 04/25/2018 DOB: 1970-05-11  Age: 48 y.o. MRN#: [id number] Attending Physician: [name], MD Primary Care Physician: [facility] [place] Date: 04/21/2018  Reason for Consultation/Follow-up: Establishing goals of care  Subjective: Eddie Davies does not have any complaints.  He has pulled out his rectal tube and soiled the bed.  RN Tech reports his CBG is 500.  I spoke with his 1/2 sister Eddie Davies [phone].  We had a long discussion about Eddie Davies's current health status including liver disease, kidney disease, and brittle diabetes.  Eddie Davies explained that she is Eddie Davies's 1/2 sister and was adopted at birth - so while she knows and loves Eddie Davies she feels somewhat uncomfortable making life and death decisions for him.  She wanted to hear from Eddie Davies to know he was in agreement with her being his medical surrogate and to know what he would want if he became critically ill again.  I went to Eddie Davies's room and we added Eddie Davies on by speaker phone.  Eddie Davies was in agreement with Eddie Davies being his medical surrogate.  We attempted to discuss [place] including hemodialysis and DNR.   Eddie Davies said multiple times that he is unhappy in the hospital.  He is happy at [facility].   At this point [place] are both in agreement with the idea of full code, full scope including hemodialysis.     Assessment: Eddie Davies is alert and conversant. Childlike in his thoughts.   He is very weak.  Unable to stand.  Having diarrhea.  Uncontrolled CBG.  He wants to return to [facility] ASAP.   Patient Profile/HPI:  48 y.o. male "Eddie Davies" with past medical history of cognitive delay, liver disease (on lactulose and rifaximin), seizure DO, CKD stage 4 (baseline cr 1.9), brittle  DM2  who was admitted on 04/21/2018 with altered mental status requiring intubation for airway protection.  Reportedly he was refusing lactulose at SNF.  He was extubated, and did not tolerate Bipap, but is not stable on room air.  Of note the patient had a recent hospitalization earlier this month for septic shock from klebsiella UTI.    Length of Stay: 4  Current Medications: Scheduled Meds:  . calcium acetate  1,334 mg Oral  TID WC  . carbamazepine  600 mg Oral BID  . chlorhexidine gluconate (MEDLINE KIT)  15 mL Mouth Rinse BID  . feeding supplement (NEPRO CARB STEADY)  237 mL Oral BID BM  . heparin injection (subcutaneous)  5,000 Units Subcutaneous Q8H  . hydrocerin   Topical BID  . insulin aspart  0-9 Units Subcutaneous TID WC  . lactulose  30 g Oral TID  . mouth rinse  15 mL Mouth Rinse QID  . multivitamin  1 tablet Oral QHS  . phenobarbital  97.2 mg Oral QHS  . rifaximin  550 mg Oral BID  . senna-docusate  1 tablet Oral BID  . sodium bicarbonate  650 mg Oral QID  . sodium chloride flush  10-40 mL Intracatheter Q12H  . tamsulosin  0.4 mg Oral QHS    Continuous Infusions: . meropenem (MERREM) IV 500 mg (04/24/18 1206)  .  sodium bicarbonate  infusion 1000 mL 75 mL/hr at 04/25/18 1105    PRN Meds: sodium chloride flush  Physical Exam       Chronically ill appearing male, awake, alert, conversant.  Childlike in his thoughts and speech. CV rrr resp (N/C is on his head).  No distress on room air Abdomen soft, nt, obese.  He has having diarrhea Skin dry and scaley on head and extremities.  Vital Signs: BP (!) 133/54 (BP Location: Right Arm)   Pulse 64   Temp (!) 97.4 F (36.3 C) (Oral)   Resp 18   Ht 6' (1.829 m) Comment: per pt and measurement verification RNs present  Wt (!) 136.6 kg   SpO2 100%   BMI 40.84 kg/m  SpO2: SpO2: 100 % O2 Device: O2 Device: Nasal Cannula O2 Flow Rate: O2 Flow Rate (L/min): 3 L/min  Intake/output summary:   Intake/Output Summary  (Last 24 hours) at 04/25/2018 1215 Last data filed at 04/25/2018 1105 Gross per 24 hour  Intake 2090.29 ml  Output 950 ml  Net 1140.29 ml   LBM: Last BM Date: 04/25/18 Baseline Weight: Weight: 130.6 kg Most recent weight: Weight: (!) 136.6 kg       Palliative Assessment/Data: 30%      Patient Active Problem List   Diagnosis Date Noted  . Respiratory failure ([place])   . Acute renal failure superimposed on stage 4 chronic kidney disease ([place])   . End stage liver disease ([place])   . Pressure injury of skin 04/21/2018  . Altered mental status, unspecified 04/21/2018  . HCAP (healthcare-associated pneumonia) 04/21/2018  . Acute renal failure (ARF) ([place]) 04/07/2018    Palliative Care Plan    Recommendations/Plan:  Full code full scope  Eddie Davies has designated Eddie Davies [phone] (his 1/2 sister) as his medical surrogate as his cousin [name] refuses to do it.  Back to SNF with Palliative to follow please.  HCPOA paper work needs to be completed at [facility] as we are unable to complete it in the hospital at this time.  PMT inpatient will sign off at this time.  Please call us back if patient declines or needs further follow up.  Goals of Care and Additional Recommendations:  Limitations on Scope of Treatment: Full Scope Treatment  Code Status:  Full code  Prognosis:   Unable to determine   Discharge Planning:  [place] for rehab with Palliative care service follow-up  Care plan was discussed with Francia Greaves, bedside RN.  Thank you for allowing the Palliative Medicine Team to assist in the care of this patient.  Total time spent:  60 min.     Greater than 50%  of this time was spent counseling and coordinating care related to the above assessment and plan.  Florentina Jenny, [name] Palliative Medicine  Please contact Palliative MedicineTeam phone at [phone] for questions and concerns between 7 am - 7 pm.   Please see AMION for individual provider pager  numbers.

## 2018-04-25 NOTE — Progress Notes (Signed)
Inpatient Diabetes Program Recommendations  AACE/ADA: New Consensus Statement on Inpatient Glycemic Control (2015)  Target Ranges:  Prepandial:   less than 140 mg/dL      Peak postprandial:   less than 180 mg/dL (1-2 hours)      Critically ill patients:  140 - 180 mg/dL   Lab Results  Component Value Date   GLUCAP 489 (H) 04/25/2018   HGBA1C 8.3 (H) 04/07/2018    Review of Glycemic Control Results for Eddie Davies, Eddie Davies (MRN [id number]) as of 04/25/2018 15:42  Ref. Range 04/23/2018 15:56 04/25/2018 12:05 04/25/2018 13:36  Glucose-Capillary Latest Ref Range: 70 - 99 mg/dL 104 (H) 507 (HH) 489 (H)   Diabetes history: DM Outpatient Diabetes medications:  Basaglar 17 units bid, Humalog 0-12 units tid with meals Current orders for Inpatient glycemic control:  Novolog sensitive tid with meals, Lantus 10 units bid Inpatient Diabetes Program Recommendations:   Note hyperglycemia this AM.  Discussed with MD.  Basal insulin and Novolog correction restarted today.  MD states that labs will rechecked this PM.    Thanks  Adah Perl, RN, BC-ADM Inpatient Diabetes Coordinator Pager [phone] (8a-5p)

## 2018-04-25 NOTE — Evaluation (Signed)
Occupational Therapy Evaluation Patient Details Name: Eddie Davies MRN: [id number] DOB: [birth year] Today's Date: 04/25/2018    History of Present Illness Patient is a 48 y/o male who presents from [facility] with AMS and acute respiratory failure with hypoxemia; intubated 3/28- 3/30. Recent admission 3/14-3/21 with septic shock from complicated UTI. CXR- RUL infiltrate. PMH includes dm, seizures, htn, esld, esrd ckd stage IV, cognitive delays.    Clinical Impression    Pt admitted with above dx. Pt currently with functional limitiations due to the deficits listed below (see OT problem list). Pt not tolerating therapy well and not initiating movements or initiating ADL tasks other than light grooming. Pt with weakness in BUE and BLEs. OTR played country music during session and pt came around to actively engaging a little more. Pt would greatly benefit from acute OT. Pt will benefit from skilled OT to increase their independence and safety with adls and balance to allow discharge skilled at SNF.     Follow Up Recommendations  SNF;Supervision/Assistance - 24 hour    Equipment Recommendations  Other (comment)(to be determined)    Recommendations for Other Services       Precautions / Restrictions Precautions Precautions: Fall Restrictions Weight Bearing Restrictions: No      Mobility Bed Mobility Overal bed mobility: Needs Assistance Bed Mobility: Sit to Supine   Sidelying to sit: Mod assist;HOB elevated Supine to sit: Max assist;HOB elevated     General bed mobility comments: Able to roll onto side and use rail; assist to elevate trunk to get to EOB. Increased effort. No dizziness.   Transfers Overall transfer level: Needs assistance Equipment used: Rolling walker (2 wheeled)   Sit to Stand: Max assist;+2 physical assistance;From elevated surface Stand pivot transfers: +2 physical assistance;Max assist       General transfer comment: Assist to power to standing  with cues for use of momentum and hand placement; on first attempt no initiation noted. Stood up on second attempt. Cues for upright, flexed trunk.    Balance Overall balance assessment: Needs assistance   Sitting balance-Leahy Scale: Fair       Standing balance-Leahy Scale: Poor                             ADL either performed or assessed with clinical judgement   ADL Overall ADL's : Needs assistance/impaired Eating/Feeding: Set up;Bed level   Grooming: Wash/dry hands;Wash/dry face;Sitting;Set up   Upper Body Bathing: Maximal assistance;Bed level   Lower Body Bathing: Total assistance;Sit to/from stand;+2 for physical assistance   Upper Body Dressing : Moderate assistance;Bed level   Lower Body Dressing: Total assistance;+2 for physical assistance;Sit to/from stand   Toilet Transfer: +2 for physical assistance;Maximal assistance Toilet Transfer Details (indicate cue type and reason): side step up in bed Toileting- Clothing Manipulation and Hygiene: Total assistance;+2 for physical assistance;Sit to/from stand Toileting - Clothing Manipulation Details (indicate cue type and reason): able to maintain standing with Mod A+2     Functional mobility during ADLs: Maximal assistance;+2 for physical assistance General ADL Comments: Pt performing set-upA for grooming, but denied need to continue with therapy as he stated that he was tired.     Vision Baseline Vision/History: No visual deficits Vision Assessment?: No apparent visual deficits     Perception     Praxis      Pertinent Vitals/Pain Pain Assessment: Faces Faces Pain Scale: Hurts little more Pain Location: generalized with movement Pain Descriptors /  Indicators: Grimacing Pain Intervention(s): Limited activity within patient's tolerance     Hand Dominance Right   Extremity/Trunk Assessment Upper Extremity Assessment Upper Extremity Assessment: Generalized weakness   Lower Extremity  Assessment Lower Extremity Assessment: Defer to PT evaluation;Generalized weakness   Cervical / Trunk Assessment Cervical / Trunk Assessment: Other exceptions Cervical / Trunk Exceptions: obesity   Communication Communication Communication: Expressive difficulties   Cognition Arousal/Alertness: Awake/alert Behavior During Therapy: Flat affect Overall Cognitive Status: No family/caregiver present to determine baseline cognitive functioning Area of Impairment: Orientation;Memory;Following commands;Problem solving;Safety/judgement;Attention                 Orientation Level: Disoriented to;Situation Current Attention Level: Sustained           General Comments: Initially sleeping on arrival but arouses well. Slow response time to questions asked- does better with options.    General Comments  Pt following most commands, but not moitvated to perform ADL tasks or mobility. Attempted sit to stand, but unwilling to try again. PA in room wanting to speak with family so pt returned to bed positioned for comfort.    Exercises     Shoulder Instructions      Home Living Family/patient expects to be discharged to:: Skilled nursing facility                                 Additional Comments: From [facility]- likely residence per chart?      Prior Functioning/Environment Level of Independence: Needs assistance  Gait / Transfers Assistance Needed: USes w/c for mobility and reports transferring without assist. Likely needs help with transfers. ADL's / Homemaking Assistance Needed: Per report, pt gets assist at times with ADls.             OT Problem List: Decreased strength;Decreased activity tolerance;Impaired balance (sitting and/or standing);Decreased cognition;Decreased safety awareness;Decreased knowledge of use of DME or AE;Obesity;Pain      OT Treatment/Interventions: Self-care/ADL training;DME and/or AE instruction;Therapeutic  activities;Patient/family education;Balance training    OT Goals(Current goals can be found in the care plan section) Acute Rehab OT Goals Patient Stated Goal: to transfer better OT Goal Formulation: Patient unable to participate in goal setting Time For Goal Achievement: 05/09/18 Potential to Achieve Goals: Good ADL Goals Pt Will Perform Grooming: with modified independence Pt Will Transfer to Toilet: with set-up Pt/caregiver will Perform Home Exercise Program: Both right and left upper extremity;With written HEP provided;Independently Additional ADL Goal #1: Pt will perform ADl functional transfers with mod +1 in prep for ADL.  OT Frequency: Min 2X/week   Barriers to D/C:            Co-evaluation              AM-PAC OT "6 Clicks" Daily Activity     Outcome Measure Help from another person eating meals?: A Little Help from another person taking care of personal grooming?: A Little Help from another person toileting, which includes using toliet, bedpan, or urinal?: Total Help from another person bathing (including washing, rinsing, drying)?: A Lot Help from another person to put on and taking off regular upper body clothing?: A Lot Help from another person to put on and taking off regular lower body clothing?: Total 6 Click Score: 12   End of Session Equipment Utilized During Treatment: Gait belt;Rolling walker Nurse Communication: Mobility status  Activity Tolerance: Patient tolerated treatment well Patient left: in bed;with call bell/phone within reach;with  bed alarm set  OT Visit Diagnosis: Unsteadiness on feet (R26.81);Muscle weakness (generalized) (M62.81);Other symptoms and signs involving cognitive function                Time: 5638-9373 OT Time Calculation (min): 25 min Charges:  OT General Charges $OT Visit: 1 Visit OT Evaluation $OT Eval Moderate Complexity: 1 Mod OT Treatments $Self Care/Home Management : 8-22 mins  Ebony Hail Harold Hedge) Marsa Aris OTR/L Acute  Rehabilitation Services Pager: [phone] Office: [phone]  Fredda Hammed 04/25/2018, 4:56 PM

## 2018-04-25 NOTE — Progress Notes (Signed)
Patient was transferred to Telemetry Medical level of care per MD order. Will continue to monitor. 

## 2018-04-25 NOTE — Progress Notes (Signed)
Patient received additional 8 units Insulin Novolog and 10 units Lantus, per MD order. Will continue to monitor.

## 2018-04-25 NOTE — Progress Notes (Signed)
PROGRESS NOTE        PATIENT DETAILS Name: Eddie Davies Age: 48 y.o. Sex: male Date of Birth: 03-28-1970 Admit Date: 04/21/2018 Admitting Physician Collene Gobble, MD XNA:[id number], [facility]  Brief Narrative: Patient is a 48 y.o. male with history of with history of CKD stage 3, presumed liver cirrhosis, hypertension, seizures-transferred from [facility] for altered mental status, acute hypoxic respiratory failure in the setting of ESBL Klebsiella pneumoniae HCAP.  Per H&P, patient was refusing medications for ESLD, and had recently refused HD.  Patient was managed in the intensive care unit, extubated on 3/30-and transferred to the triad hospitalist service.  See below for further details.  Subjective: Lying comfortably in bed-is awake and alert-speech is clear.  Denies any chest pain or shortness of breath.  Assessment/Plan: Acute metabolic encephalopathy: Resolved.  Suspect encephalopathy was multifactorial-secondary to hypoxia, pneumonia, worsening renal function.  He is completely awake and alert this morning.  Acute hypoxic respiratory failure: Intubated for airway protection-some amount of hypoxia is likely secondary to right upper lobe infiltrate.  Sputum culture positive for ESBL Klebsiella pneumonia-continue meropenem.  He was extubated on 3/30-currently on room air.  Lungs are clear to auscultation.    AKI on CKD stage III: Likely hemodynamically mediated in the setting of hypotension, UTI-renal function improving-nephrology following.  Anemia: Likely secondary to acute illness on top of anemia of CKD.  No evidence of bleeding-hemoglobin currently stable at 7.8.  Has been transfused 1 unit of PRBC on 3/30.  Follow periodically  Hypokalemia: Replete and recheck  Presumed liver cirrhosis: Appears to be well compensated at present-remains on rifaximin and lactulose.  Seizure disorder: Continue phenobarbital-resume  Tegretol  DM-2:Start SSI and follow CBGs.  Hypertension: Blood pressure controlled-without use of antihypertensives  BPH: Continue Flomax  Cognitive delay/deconditioning: Mostly wheelchair bound-has some amount of cognitive delay-resident of SNF   DVT Prophylaxis:  Heparin   Code Status: Full code or DNR  Family Communication: None at bedside  Disposition Plan: Remain inpatient-SNF on discharge over the next few days  Antimicrobial agents: Anti-infectives (From admission, onward)   Start     Dose/Rate Route Frequency Ordered Stop   04/24/18 2200  rifaximin (XIFAXAN) tablet 550 mg     550 mg Oral 2 times daily 04/24/18 1810     04/24/18 1200  meropenem (MERREM) 500 mg in sodium chloride 0.9 % 100 mL IVPB     500 mg 200 mL/hr over 30 Minutes Intravenous Every 24 hours 04/24/18 1044     04/21/18 2359  ceFEPIme (MAXIPIME) 1 g in sodium chloride 0.9 % 100 mL IVPB  Status:  Discontinued     1 g 200 mL/hr over 30 Minutes Intravenous Daily at bedtime 04/21/18 2247 04/24/18 1044   04/21/18 2359  vancomycin (VANCOCIN) 2,000 mg in sodium chloride 0.9 % 500 mL IVPB     2,000 mg 250 mL/hr over 120 Minutes Intravenous  Once 04/21/18 2316 04/22/18 0305   04/21/18 2319  vancomycin variable dose per unstable renal function (pharmacist dosing)  Status:  Discontinued      Does not apply See admin instructions 04/21/18 2319 04/24/18 1044   04/21/18 2230  rifaximin (XIFAXAN) tablet 550 mg  Status:  Discontinued     550 mg Per Tube 2 times daily 04/21/18 2223 04/24/18 1810      Procedures: None  CONSULTS:  PCCM Renal  Time spent: 25 minutes-Greater than 50% of this time was spent in counseling, explanation of diagnosis, planning of further management, and coordination of care.  MEDICATIONS: Scheduled Meds:  calcium acetate  1,334 mg Oral TID WC   chlorhexidine gluconate (MEDLINE KIT)  15 mL Mouth Rinse BID   feeding supplement (NEPRO CARB STEADY)  237 mL Oral BID BM    hydrocerin   Topical BID   lactulose  30 g Oral TID   mouth rinse  15 mL Mouth Rinse QID   multivitamin  1 tablet Oral QHS   phenobarbital  97.2 mg Oral QHS   potassium chloride  40 mEq Oral Once   rifaximin  550 mg Oral BID   senna-docusate  1 tablet Oral BID   sodium bicarbonate  650 mg Oral QID   sodium chloride flush  10-40 mL Intracatheter Q12H   Continuous Infusions:  meropenem (MERREM) IV 500 mg (04/24/18 1206)    sodium bicarbonate  infusion 1000 mL     PRN Meds:.sodium chloride flush   PHYSICAL EXAM: Vital signs: Vitals:   04/24/18 2214 04/24/18 2229 04/24/18 2229 04/25/18 0452  BP: (!) 91/50 (!) 117/51 (!) 117/51 (!) 133/54  Pulse: 75  74 64  Resp: 18   18  Temp: 97.9 F (36.6 C)   (!) 97.4 F (36.3 C)  TempSrc: Oral   Oral  SpO2: 98%   100%  Weight:      Height:       Filed Weights   04/22/18 0000 04/23/18 0000  Weight: 130.6 kg (!) 136.6 kg   Body mass index is 40.84 kg/m.   General appearance :Awake, alert, not in any distress. HEENT: Atraumatic and Normocephalic Neck: supple Resp:Good air entry bilaterally, no added sounds  CVS: S1 S2 regular GI: Bowel sounds present, Non tender and not distended with no gaurding, rigidity or rebound.No organomegaly Extremities: B/L Lower Ext shows no edema, both legs are warm to touch Neurology:  speech clear,Non focal, sensation is grossly intact. Musculoskeletal:No digital cyanosis Skin:No Rash, warm and dry Wounds:N/A  I have personally reviewed following labs and imaging studies  LABORATORY DATA: CBC: Recent Labs  Lab 04/21/18 2209  04/22/18 1802 04/22/18 1839 04/23/18 0435 04/23/18 1724 04/24/18 0258 04/25/18 0631  WBC 5.6  --  6.5  --  4.6  --  4.4 4.0  NEUTROABS 4.3  --   --   --   --   --   --   --   HGB 7.3*   < > 6.7* 8.8* 6.0* 7.7* 7.6* 7.8*  HCT 25.4*   < > 22.6* 26.0* 20.4* 26.4* 26.1* 25.5*  MCV 99.6  --  98.7  --  99.5  --  99.6 98.1  PLT 132*  --  148*  --  140*  --  144*  160   < > = values in this interval not displayed.    Basic Metabolic Panel: Recent Labs  Lab 04/22/18 1802 04/22/18 1839 04/23/18 0435 04/23/18 1724 04/24/18 0258 04/25/18 0631  NA 147* 149* 148* 148* 149* 139  K 2.8* 2.8* 2.7* 3.4* 3.3* 3.4*  CL 116*  --  120* 118* 117* 110  CO2 19*  --  18* 20* 21* 13*  GLUCOSE 129*  --  143* 126* 158* 468*  BUN 49*  --  50* 49* 48* 45*  CREATININE 6.39*  --  5.70* 4.95* 4.51* 3.86*  CALCIUM 7.4*  --  7.3* 7.7* 7.8* 7.5*  MG  --   --  2.7*  --  2.6*  --   PHOS  --   --  10.9*  --  10.7* 10.2*    GFR: Estimated Creatinine Clearance: 33.9 mL/min (A) (by C-G formula based on SCr of 3.86 mg/dL (H)).  Liver Function Tests: Recent Labs  Lab 04/21/18 2209 04/23/18 0435 04/24/18 0258 04/25/18 0631  AST 7* 11*  --  11*  ALT 9 7  --  9  ALKPHOS 121 109  --  118  BILITOT 0.3 0.9  --  1.2  PROT 5.7* 5.0*  --  5.6*  ALBUMIN 2.3* 2.0* 2.1* 2.2*   No results for input(s): LIPASE, AMYLASE in the last 168 hours. Recent Labs  Lab 04/21/18 2209 04/22/18 1802 04/24/18 0258  AMMONIA 35 37* 60*    Coagulation Profile: No results for input(s): INR, PROTIME in the last 168 hours.  Cardiac Enzymes: Recent Labs  Lab 04/24/18 0258  CKTOTAL 26*    BNP (last 3 results) No results for input(s): PROBNP in the last 8760 hours.  HbA1C: No results for input(s): HGBA1C in the last 72 hours.  CBG: Recent Labs  Lab 04/23/18 0328 04/23/18 0754 04/23/18 0939 04/23/18 1207 04/23/18 1556  GLUCAP 135* 136* 140* 122* 104*    Lipid Profile: No results for input(s): CHOL, HDL, LDLCALC, TRIG, CHOLHDL, LDLDIRECT in the last 72 hours.  Thyroid Function Tests: No results for input(s): TSH, T4TOTAL, FREET4, T3FREE, THYROIDAB in the last 72 hours.  Anemia Panel: Recent Labs    04/25/18 0631  VITAMINB12 567  FOLATE 10.0    Urine analysis:    Component Value Date/Time   COLORURINE AMBER (A) 04/08/2018 1306   APPEARANCEUR CLOUDY (A)  04/08/2018 1306   LABSPEC 1.018 04/08/2018 1306   PHURINE 5.0 04/08/2018 1306   GLUCOSEU NEGATIVE 04/08/2018 1306   HGBUR MODERATE (A) 04/08/2018 1306   BILIRUBINUR NEGATIVE 04/08/2018 1306   KETONESUR NEGATIVE 04/08/2018 1306   PROTEINUR 100 (A) 04/08/2018 1306   NITRITE NEGATIVE 04/08/2018 1306   LEUKOCYTESUR LARGE (A) 04/08/2018 1306    Sepsis Labs: Lactic Acid, Venous    Component Value Date/Time   LATICACIDVEN 0.9 04/22/2018 0419    MICROBIOLOGY: Recent Results (from the past 240 hour(s))  MRSA PCR Screening     Status: None   Collection Time: 04/21/18  9:33 PM  Result Value Ref Range Status   MRSA by PCR NEGATIVE NEGATIVE Final    Comment:        The GeneXpert MRSA Assay (FDA approved for NASAL specimens only), is one component of a comprehensive MRSA colonization surveillance program. It is not intended to diagnose MRSA infection nor to guide or monitor treatment for MRSA infections. Performed at [facility], [place] [address] [place], [place] [address]   Culture, blood (routine x 2) Call MD if unable to obtain prior to antibiotics being given     Status: None (Preliminary result)   Collection Time: 04/21/18 11:24 PM  Result Value Ref Range Status   Specimen Description BLOOD RIGHT ANTECUBITAL  Final   Special Requests   Final    BOTTLES DRAWN AEROBIC AND ANAEROBIC Blood Culture adequate volume   Culture   Final    NO GROWTH 2 DAYS Performed at [facility], [place] [address] [place], [place] [address]    Report Status PENDING  Incomplete  Culture, blood (routine x 2) Call MD if unable to obtain prior to antibiotics being given     Status: None (Preliminary result)  Collection Time: 04/21/18 11:24 PM  Result Value Ref Range Status   Specimen Description BLOOD RIGHT ANTECUBITAL  Final   Special Requests   Final    BOTTLES DRAWN AEROBIC AND ANAEROBIC Blood Culture adequate volume   Culture   Final    NO GROWTH 2 DAYS Performed at [facility], [address] [place], [place] [address]    Report Status PENDING  Incomplete  Culture, sputum-assessment     Status: None   Collection Time: 04/22/18 10:27 AM  Result Value Ref Range Status   Specimen Description TRACHEAL ASPIRATE  Final   Special Requests Normal  Final   Sputum evaluation   Final    THIS SPECIMEN IS ACCEPTABLE FOR SPUTUM CULTURE Performed at [facility], [address], [place], [place] [address]    Report Status 04/22/2018 FINAL  Final  Culture, respiratory     Status: None   Collection Time: 04/22/18 10:27 AM  Result Value Ref Range Status   Specimen Description TRACHEAL ASPIRATE  Final   Special Requests Normal Reflexed from [id number]  Final   Gram Stain   Final    FEW WBC PRESENT, PREDOMINANTLY PMN MODERATE GRAM NEGATIVE RODS RARE BUDDING YEAST SEEN Performed at [facility], [place] [address], [place],  [address]    Culture   Final    ABUNDANT KLEBSIELLA PNEUMONIAE Confirmed Extended Spectrum Beta-Lactamase Producer (ESBL).  In bloodstream infections from ESBL organisms, carbapenems are preferred over piperacillin/tazobactam. They are shown to have a lower risk of mortality.    Report Status 04/24/2018 FINAL  Final   Organism ID, Bacteria KLEBSIELLA PNEUMONIAE  Final      Susceptibility   Klebsiella pneumoniae - MIC*    AMPICILLIN >=32 RESISTANT Resistant     CEFAZOLIN >=64 RESISTANT Resistant     CEFEPIME RESISTANT Resistant     CEFTAZIDIME RESISTANT Resistant     CEFTRIAXONE RESISTANT Resistant     CIPROFLOXACIN 1 SENSITIVE Sensitive     GENTAMICIN 8 INTERMEDIATE Intermediate     IMIPENEM <=0.25 SENSITIVE Sensitive     TRIMETH/SULFA >=320 RESISTANT Resistant     AMPICILLIN/SULBACTAM 8 SENSITIVE Sensitive     PIP/TAZO <=4 SENSITIVE Sensitive     Extended ESBL POSITIVE Resistant     * ABUNDANT KLEBSIELLA PNEUMONIAE  Culture, Urine     Status: Abnormal   Collection Time: 04/23/18 11:10 AM  Result Value Ref Range Status    Specimen Description URINE, CATHETERIZED  Final   Special Requests   Final    NONE Performed at [facility], [place] [address] [place], [place] [address]    Culture (A)  Final    10,000 COLONIES/mL HAFNIA ALVEI 10,000 COLONIES/mL YEAST    Report Status 04/25/2018 FINAL  Final   Organism ID, Bacteria HAFNIA ALVEI (A)  Final      Susceptibility   Hafnia alvei - MIC*    AMPICILLIN >=32 RESISTANT Resistant     CEFAZOLIN >=64 RESISTANT Resistant     CEFTRIAXONE 32 INTERMEDIATE Intermediate     CIPROFLOXACIN <=0.25 SENSITIVE Sensitive     GENTAMICIN <=1 SENSITIVE Sensitive     IMIPENEM <=0.25 SENSITIVE Sensitive     NITROFURANTOIN <=16 SENSITIVE Sensitive     TRIMETH/SULFA <=20 SENSITIVE Sensitive     AMPICILLIN/SULBACTAM >=32 RESISTANT Resistant     PIP/TAZO >=128 RESISTANT Resistant     * 10,000 COLONIES/mL HAFNIA ALVEI    RADIOLOGY STUDIES/RESULTS: Dg Chest 1 View  Result Date:  04/08/2018 CLINICAL DATA:  Hemodialysis catheter insertion. EXAM: CHEST  1 VIEW COMPARISON:  04/08/2018 FINDINGS: There is a right IJ central venous catheter in the mid SVC. The catheter tip is long lateral wall near the brachiocephalic SVC junction. The endotracheal tube is in good position at the mid tracheal level. There is an NG tube coursing down the esophagus and into the stomach. Stable cardiac enlargement and prominent mediastinum. Persistent pulmonary edema and pleural effusions. Slight improved lung aeration when compared to earlier film. IMPRESSION: 1. Left IJ central venous catheter tip in the SVC near the brachiocephalic SVC junction. 2. Right subclavian central venous catheter is stable. 3. Stable ET and NG tubes. 4. Persistent cardiac enlargement, central vascular congestion and pulmonary edema but slight improved aeration since earlier film. 5. Small effusions. Electronically Signed   By: Marijo Sanes M.D.   On: 04/08/2018 13:11   Dg Abd 1 View  Result Date: 04/22/2018 CLINICAL DATA:  OG  tube placement. EXAM: ABDOMEN - 1 VIEW COMPARISON:  None. FINDINGS: Tip and side port of the enteric tube below the diaphragm in the stomach. Single prominent bowel loop in the upper abdomen is likely transverse colon. Cholecystectomy clips. IMPRESSION: Tip and side port of the enteric tube below the diaphragm in the stomach. Electronically Signed   By: Keith Rake M.D.   On: 04/22/2018 00:06   US Renal  Result Date: 04/07/2018 CLINICAL DATA:  Acute renal failure. EXAM: RENAL / URINARY TRACT ULTRASOUND COMPLETE COMPARISON:  Yesterday. FINDINGS: Right Kidney: Renal measurements: 13.2 x 6.8 x 6 0 cm = volume: 281 mL . Echogenicity within normal limits. No mass or hydronephrosis visualized. Left Kidney: Renal measurements: 13.2 x 6 0 x 5.2 cm = volume: 215 mL. Echogenicity within normal limits. No mass or hydronephrosis visualized. Bladder: Not visualized with a Foley catheter in place. IMPRESSION: 1. Normal kidneys without hydronephrosis. 2. Nonvisualized urinary bladder with a Foley catheter in place. Electronically Signed   By: Claudie Revering M.D.   On: 04/07/2018 19:38   Dg Chest Port 1 View  Result Date: 04/23/2018 CLINICAL DATA:  Intubated patient admitted 04/21/2018 with altered mental status. EXAM: PORTABLE CHEST 1 VIEW COMPARISON:  Single-view of the chest 04/21/2018 and 04/12/2018. FINDINGS: Right IJ catheter tip projects in the right subclavian vein, unchanged. NG tube courses into the stomach and below the inferior margin the film. Endotracheal tube tip projects at the thoracic inlet and should be advanced approximately 3 cm. Cardiomegaly is again seen. Bilateral airspace disease persists but aeration in the right upper lung zone has improved. IMPRESSION: ETT tip projects at the thoracic inlet. The tube should be advanced approximately 3 cm. Bilateral airspace disease persist but aeration in the right upper lobe is markedly improved. Electronically Signed   By: Inge Rise M.D.   On:  04/23/2018 07:51   Dg Chest Port 1 View  Result Date: 04/21/2018 CLINICAL DATA:  Respiratory failure EXAM: PORTABLE CHEST 1 VIEW COMPARISON:  04/21/2018 FINDINGS: Interval placement of esophagogastric tube, tip and side-port appear to be below the diaphragm although the lower portion of the examination is very underpenetrated, limiting visualization. Slight interval improvement in aeration of the right upper lobe. Otherwise unchanged examination with endotracheal tube projecting below the thoracic inlet, right neck vascular catheter, and cardiomegaly. IMPRESSION: Interval placement of esophagogastric tube, tip and side-port appear to be below the diaphragm although the lower portion of the examination is very underpenetrated, limiting visualization. Slight interval improvement in aeration of the right upper lobe.  Otherwise unchanged examination with endotracheal tube projecting below the thoracic inlet, right neck vascular catheter, and cardiomegaly. Electronically Signed   By: Eddie Candle M.D.   On: 04/21/2018 23:01   Dg Chest Port 1 View  Result Date: 04/12/2018 CLINICAL DATA:  Abnormal respirations. EXAM: PORTABLE CHEST 1 VIEW COMPARISON:  04/10/2018. FINDINGS: Interim extubation and removal of NG tube. Left IJ line noted with tip over superior vena cava. Cardiomegaly. Diffuse bilateral pulmonary infiltrates/edema. Improved aeration right upper lung. No pleural effusion or pneumothorax. IMPRESSION: 1. Interim extubation and removal of NG tube. Left IJ line stable position. 2.  Cardiomegaly, no interim change. 3. Diffuse bilateral pulmonary infiltrates/edema noted. Improved aeration right upper lung. Electronically Signed   By: Marcello Moores  Register   On: 04/12/2018 06:19   Dg Chest Port 1 View  Result Date: 04/10/2018 CLINICAL DATA:  Hypoxia EXAM: PORTABLE CHEST 1 VIEW COMPARISON:  April 09, 2018 FINDINGS: Endotracheal tube tip is 7.5 cm above the carina. Central catheter tip is at the junction of the  superior vena cava and left innominate vein. Nasogastric tube tip and side port are below the diaphragm. No pneumothorax. : There is patchy atelectasis in the left lower lobe and right upper lobe regions. There is no appreciable edema or consolidation. Heart is mildly enlarged with pulmonary vascularity normal. No adenopathy. No bone lesions. IMPRESSION: Tube and catheter positions as described without pneumothorax. Areas of patchy atelectasis bilaterally. No frank consolidation or edema. Stable cardiac enlargement. Electronically Signed   By: Lowella Grip III M.D.   On: 04/10/2018 07:16   Dg Chest Port 1 View  Result Date: 04/09/2018 CLINICAL DATA:  Shortness of breath EXAM: PORTABLE CHEST 1 VIEW COMPARISON:  04/08/2018 FINDINGS: Endotracheal tube tip is at the level of the clavicular heads. Left IJ central venous catheter tip is in the mid SVC. Orogastric tube tip is beyond the field of view. There is moderate cardiomegaly with mild pulmonary edema and small pleural effusions. Right subclavian approach central venous catheter is unchanged. IMPRESSION: Cardiomegaly, small pleural effusions and mild pulmonary edema. Unchanged support apparatus. Electronically Signed   By: Ulyses Jarred M.D.   On: 04/09/2018 06:18   Dg Chest Port 1 View  Result Date: 04/08/2018 CLINICAL DATA:  Respiratory failure EXAM: PORTABLE CHEST 1 VIEW COMPARISON:  04/07/2018 FINDINGS: Support Apparatus: --Endotracheal tube: Tip at the level of the clavicular heads. --Enteric tube:Tip and sideport are below the field of view. --Catheter(s):Right subclavian vein approach central venous catheter tip is at the lower SVC --Other: None Unchanged cardiomegaly and mild pulmonary edema. Failing pleural effusions are also unchanged. IMPRESSION: Unchanged appearance of the chest with findings of congestive heart failure. Unchanged support apparatus. Electronically Signed   By: Ulyses Jarred M.D.   On: 04/08/2018 05:17   Dg Chest Port 1  View  Result Date: 04/07/2018 CLINICAL DATA:  Acute respiratory failure. EXAM: PORTABLE CHEST 1 VIEW COMPARISON:  Earlier today. FINDINGS: Endotracheal tube in satisfactory position. Stable right central venous catheter. Nasogastric tube extending into the stomach. Progressive enlargement of the cardiac silhouette. Diffuse prominence of the pulmonary vasculature and interstitial markings with less bilateral airspace opacity with an improved inspiration. Unremarkable bones. IMPRESSION: Mildly progressive cardiomegaly with grossly stable changes of congestive heart failure. Electronically Signed   By: Claudie Revering M.D.   On: 04/07/2018 19:42     LOS: 4 days   Oren Binet, MD  [facility]  If 7PM-7AM, please contact night-coverage  Please page via [email or web address]  Go to [email or web address]  and use [place]'s universal password to access. If you do not have the password, please contact the hospital operator.  Locate the [facility] provider you are looking for under [facility] and page to a number that you can be directly reached. If you still have difficulty reaching the provider, please page the [facility] (Director on Call) for the Hospitalists listed on amion for assistance.  04/25/2018, 9:49 AM

## 2018-04-25 NOTE — Progress Notes (Signed)
Subjective: Interval History: has complaints , pulled foley.  Objective: Vital signs in last 24 hours: Temp:  [97.4 F (36.3 C)-97.9 F (36.6 C)] 97.4 F (36.3 C) (04/01 0452) Pulse Rate:  [64-75] 64 (04/01 0452) Resp:  [18] 18 (04/01 0452) BP: (91-133)/(50-54) 133/54 (04/01 0452) SpO2:  [98 %-100 %] 100 % (04/01 0452) Weight change:   Intake/Output from previous day: 03/31 0701 - 04/01 0700 In: 1194 [I.V.:1094; IV Piggyback:100] Out: 950 [Urine:250; Stool:700] Intake/Output this shift: Total I/O In: 656.3 [P.O.:440; I.V.:216.3] Out: -   General appearance: cooperative, morbidly obese, pale and slowed mentation Resp: diminished breath sounds bilaterally and rhonchi bibasilar Cardio: S1, S2 normal and systolic murmur: systolic ejection 2/6, crescendo and decrescendo at 2nd left intercostal space GI: obese pos bs, soft,  Extremities: edema 1+  Lab Results: Recent Labs    04/24/18 0258 04/25/18 0631  WBC 4.4 4.0  HGB 7.6* 7.8*  HCT 26.1* 25.5*  PLT 144* 160   BMET:  Recent Labs    04/24/18 0258 04/25/18 0631  NA 149* 139  K 3.3* 3.4*  CL 117* 110  CO2 21* 13*  GLUCOSE 158* 468*  BUN 48* 45*  CREATININE 4.51* 3.86*  CALCIUM 7.8* 7.5*   No results for input(s): PTH in the last 72 hours. Iron Studies: No results for input(s): IRON, TIBC, TRANSFERRIN, FERRITIN in the last 72 hours.  Studies/Results: No results found.  I have reviewed the patient's current medications.  Assessment/Plan: 1 AKI improving..  Acidemic stool and low GFR.  Will add more bicarb. ETIO schock, UTI. Counseled, very limited insight 2 Low K, with D replete.  3 massive obesity 4 Nonadherence 5 Liver dz 6 UTIs 7 DM out of control 8 ^ phos add binder, CK ok 9 Cognitive deficits P binder, po and iv bicarb.     LOS: 4 days   [name] Ejay Lashley 04/25/2018,10:05 AM

## 2018-04-25 NOTE — Progress Notes (Signed)
  Speech Language Pathology Treatment: Dysphagia  Patient Details Name: Eddie Davies MRN: [id number] DOB: May 20, 1970 Today's Date: 04/25/2018 Time: 3382-5053 SLP Time Calculation (min) (ACUTE ONLY): 15 min  Assessment / Plan / Recommendation Clinical Impression  Pt consumed trials of regular solids and thin liquids. Oral phase appeared functional. He did have three episodes of delayed coughing across trials. SLP provided additional repositioning of bed and Min cues for smaller sips in an attempt to reduce risk for aspiration. RN reports no overt signs of difficulty so far today, even taking large pills with thin liquids without signs of aspiration. Question if his positioning and distractibility in mildly distracting environment were contributing to current presentation. Would continue regular solids and thin liquids for now, with pt likely benefiting from set-up assistance to ensure safety with meals. SLP will continue to follow briefly while in the hospital.   HPI HPI: 48 yo male with h/o dm, seizures, htn, esld, esrd ckd stage IV presents as transfer from [facility] for [place] requiring intubation 3/28-3/30.  The pt had an admission 3/14- 3/21 for encephalopathy with subsequent intubation, recommended for regular solids/thin liquids by SLP at that time. Per chart, he has reportedly been refusing dialysis and meds for ESLD at SNF.      SLP Plan  Continue with current plan of care       Recommendations  Diet recommendations: Regular;Thin liquid Liquids provided via: Cup;Straw Medication Administration: Whole meds with liquid Supervision: Patient able to self feed;Intermittent supervision to cue for compensatory strategies Compensations: Minimize environmental distractions;Slow rate;Small sips/bites Postural Changes and/or Swallow Maneuvers: Seated upright 90 degrees                Oral Care Recommendations: Oral care BID Follow up Recommendations: Skilled Nursing facility SLP  Visit Diagnosis: Dysphagia, unspecified (R13.10) Plan: Continue with current plan of care       GO                Venita Sheffield Reathel Turi 04/25/2018, 5:11 PM  [name], M.A. [place] Acute Environmental education officer [phone] Office [phone]

## 2018-04-25 NOTE — Progress Notes (Signed)
Patient's CBG is 507. He received 9 units Insulin per SS and MD was informed. Will continue to monitor.

## 2018-04-25 NOTE — Progress Notes (Signed)
PHARMACY NOTE -  ANTIBIOTIC RENAL DOSE ADJUSTMENT per Pharmacy and Therapeutic Committee policy    .  Patient was been initiated on Merrem 534m  IV q24h  for HCAP-ESBL Klebsiella.  Initial dosing for AKI, SCr 4.51  AKI on CKD IV, Baseline Scr ~1.9.  Admit SCr  6.9> has  trended down/improved today to 3.86. Estimated CrCl 33.9  Ml/min.  Merrem dose should be adjusted for improved renal function.  Plan:  Merrem dose increased to 1 gm IV q12h   RNicole Cella RNorth CarolinaClinical Pharmacist [phone] check AMION for all MAledophone numbers After 10:00 PM, call MSt. [phone]/01/2018 4:45 PM

## 2018-04-26 LAB — RENAL FUNCTION PANEL
Albumin: 2.1 g/dL — ABNORMAL LOW (ref 3.5–5.0)
Anion gap: 8 (ref 5–15)
BUN: 45 mg/dL — ABNORMAL HIGH (ref 6–20)
CO2: 18 mmol/L — ABNORMAL LOW (ref 22–32)
Calcium: 7.3 mg/dL — ABNORMAL LOW (ref 8.9–10.3)
Chloride: 110 mmol/L (ref 98–111)
Creatinine, Ser: 3.26 mg/dL — ABNORMAL HIGH (ref 0.61–1.24)
GFR calc Af Amer: 25 mL/min — ABNORMAL LOW (ref >60)
GFR calc non Af Amer: 21 mL/min — ABNORMAL LOW (ref >60)
Glucose, Bld: 397 mg/dL — ABNORMAL HIGH (ref 70–99)
Phosphorus: 8.3 mg/dL — ABNORMAL HIGH (ref 2.5–4.6)
Potassium: 2.8 mmol/L — ABNORMAL LOW (ref 3.5–5.1)
Sodium: 136 mmol/L (ref 135–145)

## 2018-04-26 LAB — GLUCOSE, CAPILLARY
Glucose-Capillary: 325 mg/dL — ABNORMAL HIGH (ref 70–99)
Glucose-Capillary: 323 mg/dL — ABNORMAL HIGH (ref 70–99)
Glucose-Capillary: 323 mg/dL — ABNORMAL HIGH (ref 70–99)
Glucose-Capillary: 201 mg/dL — ABNORMAL HIGH (ref 70–99)

## 2018-04-26 MED ORDER — SODIUM BICARBONATE 650 MG PO TABS
1300.0000 mg | ORAL_TABLET | Freq: Four times a day (QID) | ORAL | Status: DC
  Administered 2018-04-26 – 2018-04-27 (×5): 1300 mg via ORAL
  Filled 2018-04-26 (×5): qty 2

## 2018-04-26 MED ORDER — POTASSIUM CHLORIDE CRYS ER 20 MEQ PO TBCR
40.0000 meq | EXTENDED_RELEASE_TABLET | Freq: Once | ORAL | Status: AC
  Administered 2018-04-26: 40 meq via ORAL
  Filled 2018-04-26: qty 2

## 2018-04-26 MED ORDER — INSULIN ASPART 100 UNIT/ML ~~LOC~~ SOLN
0.0000 [IU] | Freq: Three times a day (TID) | SUBCUTANEOUS | Status: DC
  Administered 2018-04-26 (×3): 11 [IU] via SUBCUTANEOUS
  Administered 2018-04-27 (×2): 5 [IU] via SUBCUTANEOUS
  Administered 2018-04-28: 2 [IU] via SUBCUTANEOUS
  Administered 2018-04-29 – 2018-04-30 (×3): 5 [IU] via SUBCUTANEOUS
  Administered 2018-04-30 (×2): 2 [IU] via SUBCUTANEOUS
  Administered 2018-05-02: 5 [IU] via SUBCUTANEOUS
  Administered 2018-05-02: 11:00:00 2 [IU] via SUBCUTANEOUS
  Administered 2018-05-02: 8 [IU] via SUBCUTANEOUS
  Administered 2018-05-03: 5 [IU] via SUBCUTANEOUS
  Administered 2018-05-03 (×2): 3 [IU] via SUBCUTANEOUS
  Administered 2018-05-04: 5 [IU] via SUBCUTANEOUS
  Administered 2018-05-04: 2 [IU] via SUBCUTANEOUS
  Administered 2018-05-04 – 2018-05-05 (×2): 5 [IU] via SUBCUTANEOUS

## 2018-04-26 MED ORDER — POTASSIUM CHLORIDE 10 MEQ/100ML IV SOLN
10.0000 meq | INTRAVENOUS | Status: AC
  Administered 2018-04-26 (×3): 10 meq via INTRAVENOUS
  Filled 2018-04-26 (×3): qty 100

## 2018-04-26 MED ORDER — INSULIN GLARGINE 100 UNIT/ML ~~LOC~~ SOLN
15.0000 [IU] | Freq: Two times a day (BID) | SUBCUTANEOUS | Status: DC
  Administered 2018-04-26 – 2018-05-05 (×19): 15 [IU] via SUBCUTANEOUS
  Filled 2018-04-26 (×22): qty 0.15

## 2018-04-26 MED ORDER — POTASSIUM CHLORIDE CRYS ER 20 MEQ PO TBCR
40.0000 meq | EXTENDED_RELEASE_TABLET | Freq: Two times a day (BID) | ORAL | Status: AC
  Administered 2018-04-26 – 2018-04-27 (×4): 40 meq via ORAL
  Filled 2018-04-26 (×4): qty 2

## 2018-04-26 NOTE — Progress Notes (Signed)
CRITICAL VALUE ALERT  Critical Value:  Potassium: 2.8  Date & Time Notied:  04/26/18 @ 0433  Provider Notified: Dr. Baltazar Najjar  Orders Received/Actions taken: MD notified, RN to continue to monitor.

## 2018-04-26 NOTE — Progress Notes (Signed)
PROGRESS NOTE        PATIENT DETAILS Name: Eddie Davies Age: 48 y.o. Sex: male Date of Birth: 01-04-1971 Admit Date: 04/21/2018 Admitting Physician Collene Gobble, MD [id number], [facility]  Brief Narrative: Patient is a 48 y.o. male with history of with history of CKD stage 3, presumed liver cirrhosis, hypertension, seizures-transferred from [facility] for altered mental status, acute hypoxic respiratory failure in the setting of ESBL Klebsiella pneumoniae HCAP.  Per H&P, patient was refusing medications for ESLD, and had recently refused HD.  Patient was managed in the intensive care unit, extubated on 3/30-and transferred to the triad hospitalist service.  See below for further details.  Subjective: Lying comfortably in bed-denies any chest pain-shortness of breath, nausea vomiting.  Assessment/Plan: Acute metabolic encephalopathy: Resolved-he is completely awake and alert.  Suspect encephalopathy was multifactorial-secondary to hypoxia, pneumonia, worsening renal function.  Acute hypoxic respiratory failure: Intubated for airway protection-some amount of hypoxia is likely secondary to right upper lobe infiltrate.  Sputum culture positive for ESBL Klebsiella pneumonia-continue meropenem.  He was extubated on 3/30-currently on room air.  Lungs are clear to auscultation.    ESBL Klebsiella pneumonia: Continue meropenem for total of 7 days-last dose on 4/6.  AKI on CKD stage III: Likely hemodynamically mediated in the setting of hypotension, UTI-renal function improving-nephrology following.  Uncontrolled DM-2 with hyperglycemia: CBGs slowly improving-better than yesterday-change Lantus to 15 units twice daily, change SSI to moderate scale.  Follow and adjust accordingly.  Anemia: Likely secondary to acute illness on top of anemia of CKD.  No evidence of bleeding-follow hemoglobin periodically.  Has been transfused 1 unit of PRBC on 3/30.   Follow periodically  Hypokalemia: Continue to replete-recheck tomorrow morning.  Presumed liver cirrhosis: Appears to be well compensated at present-he is awake and alert with no evidence of encephalopathy-remains on rifaximin and lactulose.  Seizure disorder: Stable-continue phenobarbital and Tegretol  Hypertension: BP controlled-continue monitoring without the use of any antihypertensives  BPH: Continue Flomax  Cognitive delay/deconditioning: Mostly wheelchair bound-has some amount of cognitive delay-resident of SNF   DVT Prophylaxis:  Heparin   Code Status: Full code  Family Communication: None at bedside  Disposition Plan: Remain inpatient-SNF on discharge over the next few days  Antimicrobial agents: Anti-infectives (From admission, onward)   Start     Dose/Rate Route Frequency Ordered Stop   04/25/18 1800  meropenem (MERREM) 1 g in sodium chloride 0.9 % 100 mL IVPB     1 g 200 mL/hr over 30 Minutes Intravenous Every 12 hours 04/25/18 1641     04/24/18 2200  rifaximin (XIFAXAN) tablet 550 mg     550 mg Oral 2 times daily 04/24/18 1810     04/24/18 1200  meropenem (MERREM) 500 mg in sodium chloride 0.9 % 100 mL IVPB  Status:  Discontinued     500 mg 200 mL/hr over 30 Minutes Intravenous Every 24 hours 04/24/18 1044 04/25/18 1641   04/21/18 2359  ceFEPIme (MAXIPIME) 1 g in sodium chloride 0.9 % 100 mL IVPB  Status:  Discontinued     1 g 200 mL/hr over 30 Minutes Intravenous Daily at bedtime 04/21/18 2247 04/24/18 1044   04/21/18 2359  vancomycin (VANCOCIN) 2,000 mg in sodium chloride 0.9 % 500 mL IVPB     2,000 mg 250 mL/hr over 120 Minutes Intravenous  Once 04/21/18  2316 04/22/18 0305   04/21/18 2319  vancomycin variable dose per unstable renal function (pharmacist dosing)  Status:  Discontinued      Does not apply See admin instructions 04/21/18 2319 04/24/18 1044   04/21/18 2230  rifaximin (XIFAXAN) tablet 550 mg  Status:  Discontinued     550 mg Per Tube 2 times  daily 04/21/18 2223 04/24/18 1810      Procedures: None  CONSULTS: PCCM Renal  Time spent: 25 minutes-Greater than 50% of this time was spent in counseling, explanation of diagnosis, planning of further management, and coordination of care.  MEDICATIONS: Scheduled Meds:  calcium acetate  1,334 mg Oral TID WC   carbamazepine  600 mg Oral BID   chlorhexidine gluconate (MEDLINE KIT)  15 mL Mouth Rinse BID   feeding supplement (NEPRO CARB STEADY)  237 mL Oral BID BM   heparin injection (subcutaneous)  5,000 Units Subcutaneous Q8H   hydrocerin   Topical BID   insulin aspart  0-15 Units Subcutaneous TID WC   insulin glargine  15 Units Subcutaneous BID   lactulose  30 g Oral TID   mouth rinse  15 mL Mouth Rinse QID   multivitamin  1 tablet Oral QHS   phenobarbital  97.2 mg Oral QHS   rifaximin  550 mg Oral BID   senna-docusate  1 tablet Oral BID   sodium bicarbonate  650 mg Oral QID   sodium chloride flush  10-40 mL Intracatheter Q12H   tamsulosin  0.4 mg Oral QHS   Continuous Infusions:  meropenem (MERREM) IV Stopped (04/25/18 1815)    sodium bicarbonate  infusion 1000 mL Stopped (04/26/18 0025)   PRN Meds:.sodium chloride flush   PHYSICAL EXAM: Vital signs: Vitals:   04/25/18 1200 04/25/18 1504 04/25/18 2136 04/26/18 0540  BP: 121/75 123/61 101/61 134/81  Pulse:  67 65 62  Resp: _0 Temp: 97.9 F (36.6 C)  (!) 97.4 F (36.3 C) 97.6 F (36.4 C)  TempSrc: Oral  Oral Oral  SpO2: 95% 100% 100% 100%  Weight:      Height:       Filed Weights   04/22/18 0000 04/23/18 0000  Weight: 130.6 kg (!) 136.6 kg   Body mass index is 40.84 kg/m.   General appearance:Awake, alert, not in any distress.  Eyes:no scleral icterus. HEENT: Atraumatic and Normocephalic Neck: supple, no JVD. Resp:Good air entry bilaterally,no rales or rhonchi CVS: S1 S2 regular GI: Bowel sounds present, Non tender and not distended with no gaurding, rigidity or  rebound. Neurology:  Non focal Musculoskeletal:No digital cyanosis Skin:No Rash, warm and dry Wounds:N/A  I have personally reviewed following labs and imaging studies  LABORATORY DATA: CBC: Recent Labs  Lab 04/21/18 2209  04/22/18 1802 04/22/18 1839 04/23/18 0435 04/23/18 1724 04/24/18 0258 04/25/18 0631  WBC 5.6  --  6.5  --  4.6  --  4.4 4.0  NEUTROABS 4.3  --   --   --   --   --   --   --   HGB 7.3*   < > 6.7* 8.8* 6.0* 7.7* 7.6* 7.8*  HCT 25.4*   < > 22.6* 26.0* 20.4* 26.4* 26.1* 25.5*  MCV 99.6  --  98.7  --  99.5  --  99.6 98.1  PLT 132*  --  148*  --  140*  --  144* 160   < > = values in this interval not displayed.    Basic Metabolic Panel: Recent  Labs  Lab 04/23/18 0435 04/23/18 1724 04/24/18 0258 04/25/18 0631 04/25/18 1615 04/26/18 0316  NA 148* 148* 149* 139 139 136  K 2.7* 3.4* 3.3* 3.4* 3.0* 2.8*  CL 120* 118* 117* 110 112* 110  CO2 18* 20* 21* 13* 16* 18*  GLUCOSE 143* 126* 158* 468* 432* 397*  BUN 50* 49* 48* 45* 46* 45*  CREATININE 5.70* 4.95* 4.51* 3.86* 3.48* 3.26*  CALCIUM 7.3* 7.7* 7.8* 7.5* 7.6* 7.3*  MG 2.7*  --  2.6*  --   --   --   PHOS 10.9*  --  10.7* 10.2*  --  8.3*    GFR: Estimated Creatinine Clearance: 40.1 mL/min (A) (by C-G formula based on SCr of 3.26 mg/dL (H)).  Liver Function Tests: Recent Labs  Lab 04/21/18 2209 04/23/18 0435 04/24/18 0258 04/25/18 0631 04/26/18 0316  AST 7* 11*  --  11*  --   ALT 9 7  --  9  --   ALKPHOS 121 109  --  118  --   BILITOT 0.3 0.9  --  1.2  --   PROT 5.7* 5.0*  --  5.6*  --   ALBUMIN 2.3* 2.0* 2.1* 2.2* 2.1*   No results for input(s): LIPASE, AMYLASE in the last 168 hours. Recent Labs  Lab 04/21/18 2209 04/22/18 1802 04/24/18 0258  AMMONIA 35 37* 60*    Coagulation Profile: No results for input(s): INR, PROTIME in the last 168 hours.  Cardiac Enzymes: Recent Labs  Lab 04/24/18 0258  CKTOTAL 26*    BNP (last 3 results) No results for input(s): PROBNP in the last  8760 hours.  HbA1C: No results for input(s): HGBA1C in the last 72 hours.  CBG: Recent Labs  Lab 04/25/18 1205 04/25/18 1336 04/25/18 1626 04/25/18 2135 04/26/18 0759  GLUCAP 507* 489* 348* 358* 325*    Lipid Profile: No results for input(s): CHOL, HDL, LDLCALC, TRIG, CHOLHDL, LDLDIRECT in the last 72 hours.  Thyroid Function Tests: No results for input(s): TSH, T4TOTAL, FREET4, T3FREE, THYROIDAB in the last 72 hours.  Anemia Panel: Recent Labs    04/25/18 0631  VITAMINB12 567  FOLATE 10.0    Urine analysis:    Component Value Date/Time   COLORURINE AMBER (A) 04/08/2018 1306   APPEARANCEUR CLOUDY (A) 04/08/2018 1306   LABSPEC 1.018 04/08/2018 1306   PHURINE 5.0 04/08/2018 1306   GLUCOSEU NEGATIVE 04/08/2018 1306   HGBUR MODERATE (A) 04/08/2018 1306   BILIRUBINUR NEGATIVE 04/08/2018 1306   KETONESUR NEGATIVE 04/08/2018 1306   PROTEINUR 100 (A) 04/08/2018 1306   NITRITE NEGATIVE 04/08/2018 1306   LEUKOCYTESUR LARGE (A) 04/08/2018 1306    Sepsis Labs: Lactic Acid, Venous    Component Value Date/Time   LATICACIDVEN 0.9 04/22/2018 0419    MICROBIOLOGY: Recent Results (from the past 240 hour(s))  MRSA PCR Screening     Status: None   Collection Time: 04/21/18  9:33 PM  Result Value Ref Range Status   MRSA by PCR NEGATIVE NEGATIVE Final    Comment:        The GeneXpert MRSA Assay (FDA approved for NASAL specimens only), is one component of a comprehensive MRSA colonization surveillance program. It is not intended to diagnose MRSA infection nor to guide or monitor treatment for MRSA infections. Performed at [facility], [place] [address], [place], [place] [address]   Culture, blood (routine x 2) Call MD if unable to obtain prior to antibiotics being given     Status: None (  Preliminary result)   Collection Time: 04/21/18 11:24 PM  Result Value Ref Range Status   Specimen Description BLOOD RIGHT ANTECUBITAL  Final   Special Requests   Final     BOTTLES DRAWN AEROBIC AND ANAEROBIC Blood Culture adequate volume   Culture   Final    NO GROWTH 3 DAYS Performed at [facility], [address] [place], [place] [address]    Report Status PENDING  Incomplete  Culture, blood (routine x 2) Call MD if unable to obtain prior to antibiotics being given     Status: None (Preliminary result)   Collection Time: 04/21/18 11:24 PM  Result Value Ref Range Status   Specimen Description BLOOD RIGHT ANTECUBITAL  Final   Special Requests   Final    BOTTLES DRAWN AEROBIC AND ANAEROBIC Blood Culture adequate volume   Culture   Final    NO GROWTH 3 DAYS Performed at [facility], [place] [address] [place], [place] [address]    Report Status PENDING  Incomplete  Culture, sputum-assessment     Status: None   Collection Time: 04/22/18 10:27 AM  Result Value Ref Range Status   Specimen Description TRACHEAL ASPIRATE  Final   Special Requests Normal  Final   Sputum evaluation   Final    THIS SPECIMEN IS ACCEPTABLE FOR SPUTUM CULTURE Performed at [facility], [address] [place], [place] [address]    Report Status 04/22/2018 FINAL  Final  Culture, respiratory     Status: None   Collection Time: 04/22/18 10:27 AM  Result Value Ref Range Status   Specimen Description TRACHEAL ASPIRATE  Final   Special Requests Normal Reflexed from [id number]  Final   Gram Stain   Final    FEW WBC PRESENT, PREDOMINANTLY PMN MODERATE GRAM NEGATIVE RODS RARE BUDDING YEAST SEEN Performed at [facility], [place] [address], [place], [place] [address]    Culture   Final    ABUNDANT KLEBSIELLA PNEUMONIAE Confirmed Extended Spectrum Beta-Lactamase Producer (ESBL).  In bloodstream infections from ESBL organisms, carbapenems are preferred over piperacillin/tazobactam. They are shown to have a lower risk of mortality.    Report Status 04/24/2018 FINAL  Final   Organism ID, Bacteria KLEBSIELLA PNEUMONIAE  Final      Susceptibility   Klebsiella pneumoniae  - MIC*    AMPICILLIN >=32 RESISTANT Resistant     CEFAZOLIN >=64 RESISTANT Resistant     CEFEPIME RESISTANT Resistant     CEFTAZIDIME RESISTANT Resistant     CEFTRIAXONE RESISTANT Resistant     CIPROFLOXACIN 1 SENSITIVE Sensitive     GENTAMICIN 8 INTERMEDIATE Intermediate     IMIPENEM <=0.25 SENSITIVE Sensitive     TRIMETH/SULFA >=320 RESISTANT Resistant     AMPICILLIN/SULBACTAM 8 SENSITIVE Sensitive     PIP/TAZO <=4 SENSITIVE Sensitive     Extended ESBL POSITIVE Resistant     * ABUNDANT KLEBSIELLA PNEUMONIAE  Culture, Urine     Status: Abnormal   Collection Time: 04/23/18 11:10 AM  Result Value Ref Range Status   Specimen Description URINE, CATHETERIZED  Final   Special Requests   Final    NONE Performed at [facility], [place] [address] [place], [place] [address]    Culture (A)  Final    10,000 COLONIES/mL HAFNIA ALVEI 10,000 COLONIES/mL YEAST    Report Status 04/25/2018 FINAL  Final   Organism ID, Bacteria HAFNIA ALVEI (A)  Final      Susceptibility  Hafnia alvei - MIC*    AMPICILLIN >=32 RESISTANT Resistant     CEFAZOLIN >=64 RESISTANT Resistant     CEFTRIAXONE 32 INTERMEDIATE Intermediate     CIPROFLOXACIN <=0.25 SENSITIVE Sensitive     GENTAMICIN <=1 SENSITIVE Sensitive     IMIPENEM <=0.25 SENSITIVE Sensitive     NITROFURANTOIN <=16 SENSITIVE Sensitive     TRIMETH/SULFA <=20 SENSITIVE Sensitive     AMPICILLIN/SULBACTAM >=32 RESISTANT Resistant     PIP/TAZO >=128 RESISTANT Resistant     * 10,000 COLONIES/mL HAFNIA ALVEI    RADIOLOGY STUDIES/RESULTS: Dg Chest 1 View  Result Date: 04/08/2018 CLINICAL DATA:  Hemodialysis catheter insertion. EXAM: CHEST  1 VIEW COMPARISON:  04/08/2018 FINDINGS: There is a right IJ central venous catheter in the mid SVC. The catheter tip is long lateral wall near the brachiocephalic SVC junction. The endotracheal tube is in good position at the mid tracheal level. There is an NG tube coursing down the esophagus and into the  stomach. Stable cardiac enlargement and prominent mediastinum. Persistent pulmonary edema and pleural effusions. Slight improved lung aeration when compared to earlier film. IMPRESSION: 1. Left IJ central venous catheter tip in the SVC near the brachiocephalic SVC junction. 2. Right subclavian central venous catheter is stable. 3. Stable ET and NG tubes. 4. Persistent cardiac enlargement, central vascular congestion and pulmonary edema but slight improved aeration since earlier film. 5. Small effusions. Electronically Signed   By: Marijo Sanes M.D.   On: 04/08/2018 13:11   Dg Abd 1 View  Result Date: 04/22/2018 CLINICAL DATA:  OG tube placement. EXAM: ABDOMEN - 1 VIEW COMPARISON:  None. FINDINGS: Tip and side port of the enteric tube below the diaphragm in the stomach. Single prominent bowel loop in the upper abdomen is likely transverse colon. Cholecystectomy clips. IMPRESSION: Tip and side port of the enteric tube below the diaphragm in the stomach. Electronically Signed   By: Keith Rake M.D.   On: 04/22/2018 00:06   US Renal  Result Date: 04/07/2018 CLINICAL DATA:  Acute renal failure. EXAM: RENAL / URINARY TRACT ULTRASOUND COMPLETE COMPARISON:  Yesterday. FINDINGS: Right Kidney: Renal measurements: 13.2 x 6.8 x 6 0 cm = volume: 281 mL . Echogenicity within normal limits. No mass or hydronephrosis visualized. Left Kidney: Renal measurements: 13.2 x 6 0 x 5.2 cm = volume: 215 mL. Echogenicity within normal limits. No mass or hydronephrosis visualized. Bladder: Not visualized with a Foley catheter in place. IMPRESSION: 1. Normal kidneys without hydronephrosis. 2. Nonvisualized urinary bladder with a Foley catheter in place. Electronically Signed   By: Claudie Revering M.D.   On: 04/07/2018 19:38   Dg Chest Port 1 View  Result Date: 04/23/2018 CLINICAL DATA:  Intubated patient admitted 04/21/2018 with altered mental status. EXAM: PORTABLE CHEST 1 VIEW COMPARISON:  Single-view of the chest 04/21/2018  and 04/12/2018. FINDINGS: Right IJ catheter tip projects in the right subclavian vein, unchanged. NG tube courses into the stomach and below the inferior margin the film. Endotracheal tube tip projects at the thoracic inlet and should be advanced approximately 3 cm. Cardiomegaly is again seen. Bilateral airspace disease persists but aeration in the right upper lung zone has improved. IMPRESSION: ETT tip projects at the thoracic inlet. The tube should be advanced approximately 3 cm. Bilateral airspace disease persist but aeration in the right upper lobe is markedly improved. Electronically Signed   By: Inge Rise M.D.   On: 04/23/2018 07:51   Dg Chest Port 1 View  Result Date: 04/21/2018  CLINICAL DATA:  Respiratory failure EXAM: PORTABLE CHEST 1 VIEW COMPARISON:  04/21/2018 FINDINGS: Interval placement of esophagogastric tube, tip and side-port appear to be below the diaphragm although the lower portion of the examination is very underpenetrated, limiting visualization. Slight interval improvement in aeration of the right upper lobe. Otherwise unchanged examination with endotracheal tube projecting below the thoracic inlet, right neck vascular catheter, and cardiomegaly. IMPRESSION: Interval placement of esophagogastric tube, tip and side-port appear to be below the diaphragm although the lower portion of the examination is very underpenetrated, limiting visualization. Slight interval improvement in aeration of the right upper lobe. Otherwise unchanged examination with endotracheal tube projecting below the thoracic inlet, right neck vascular catheter, and cardiomegaly. Electronically Signed   By: Eddie Candle M.D.   On: 04/21/2018 23:01   Dg Chest Port 1 View  Result Date: 04/12/2018 CLINICAL DATA:  Abnormal respirations. EXAM: PORTABLE CHEST 1 VIEW COMPARISON:  04/10/2018. FINDINGS: Interim extubation and removal of NG tube. Left IJ line noted with tip over superior vena cava. Cardiomegaly. Diffuse  bilateral pulmonary infiltrates/edema. Improved aeration right upper lung. No pleural effusion or pneumothorax. IMPRESSION: 1. Interim extubation and removal of NG tube. Left IJ line stable position. 2.  Cardiomegaly, no interim change. 3. Diffuse bilateral pulmonary infiltrates/edema noted. Improved aeration right upper lung. Electronically Signed   By: Marcello Moores  Register   On: 04/12/2018 06:19   Dg Chest Port 1 View  Result Date: 04/10/2018 CLINICAL DATA:  Hypoxia EXAM: PORTABLE CHEST 1 VIEW COMPARISON:  April 09, 2018 FINDINGS: Endotracheal tube tip is 7.5 cm above the carina. Central catheter tip is at the junction of the superior vena cava and left innominate vein. Nasogastric tube tip and side port are below the diaphragm. No pneumothorax. : There is patchy atelectasis in the left lower lobe and right upper lobe regions. There is no appreciable edema or consolidation. Heart is mildly enlarged with pulmonary vascularity normal. No adenopathy. No bone lesions. IMPRESSION: Tube and catheter positions as described without pneumothorax. Areas of patchy atelectasis bilaterally. No frank consolidation or edema. Stable cardiac enlargement. Electronically Signed   By: Lowella Grip III M.D.   On: 04/10/2018 07:16   Dg Chest Port 1 View  Result Date: 04/09/2018 CLINICAL DATA:  Shortness of breath EXAM: PORTABLE CHEST 1 VIEW COMPARISON:  04/08/2018 FINDINGS: Endotracheal tube tip is at the level of the clavicular heads. Left IJ central venous catheter tip is in the mid SVC. Orogastric tube tip is beyond the field of view. There is moderate cardiomegaly with mild pulmonary edema and small pleural effusions. Right subclavian approach central venous catheter is unchanged. IMPRESSION: Cardiomegaly, small pleural effusions and mild pulmonary edema. Unchanged support apparatus. Electronically Signed   By: Ulyses Jarred M.D.   On: 04/09/2018 06:18   Dg Chest Port 1 View  Result Date: 04/08/2018 CLINICAL DATA:   Respiratory failure EXAM: PORTABLE CHEST 1 VIEW COMPARISON:  04/07/2018 FINDINGS: Support Apparatus: --Endotracheal tube: Tip at the level of the clavicular heads. --Enteric tube:Tip and sideport are below the field of view. --Catheter(s):Right subclavian vein approach central venous catheter tip is at the lower SVC --Other: None Unchanged cardiomegaly and mild pulmonary edema. Failing pleural effusions are also unchanged. IMPRESSION: Unchanged appearance of the chest with findings of congestive heart failure. Unchanged support apparatus. Electronically Signed   By: Ulyses Jarred M.D.   On: 04/08/2018 05:17   Dg Chest Port 1 View  Result Date: 04/07/2018 CLINICAL DATA:  Acute respiratory failure. EXAM: PORTABLE CHEST 1  VIEW COMPARISON:  Earlier today. FINDINGS: Endotracheal tube in satisfactory position. Stable right central venous catheter. Nasogastric tube extending into the stomach. Progressive enlargement of the cardiac silhouette. Diffuse prominence of the pulmonary vasculature and interstitial markings with less bilateral airspace opacity with an improved inspiration. Unremarkable bones. IMPRESSION: Mildly progressive cardiomegaly with grossly stable changes of congestive heart failure. Electronically Signed   By: Claudie Revering M.D.   On: 04/07/2018 19:42     LOS: 5 days   Oren Binet, MD  [facility]  If 7PM-7AM, please contact night-coverage  Please page via [email or web address]  Go to [email or web address] and use [facility]'s universal password to access. If you do not have the password, please contact the hospital operator.  Locate the [facility] provider you are looking for under [facility] and page to a number that you can be directly reached. If you still have difficulty reaching the provider, please page the [facility] (Director on Call) for the Hospitalists listed on amion for assistance.  04/26/2018, 8:36 AM

## 2018-04-26 NOTE — Progress Notes (Signed)
Results for ELLIAS, MCELREATH (MRN [id number]) as of 04/26/2018 12:50  Ref. Range 04/25/2018 16:26 04/25/2018 21:35 04/26/2018 07:59 04/26/2018 12:36  Glucose-Capillary Latest Ref Range: 70 - 99 mg/dL 348 (H) 358 (H) 325 (H) 323 (H)  Noted that blood sugars continue to be greater than 250 mg/dl.   Recommend increasing Lantus to 18 units BID and increase Novolog to RESISTANT correction scale TID & HS scale. If blood sugars continue to be elevated and patient is eating at least 50% of meal, recommend adding Novolog 4  Units TID as meal coverage.   Harvel Ricks RN BSN CDE Diabetes Coordinator Pager: [phone]  8am-5pm

## 2018-04-26 NOTE — Progress Notes (Signed)
Subjective: Interval History: has complaints wants to go home.  Objective: Vital signs in last 24 hours: Temp:  [97.4 F (36.3 C)-97.9 F (36.6 C)] 97.6 F (36.4 C) (04/02 0540) Pulse Rate:  [62-67] 62 (04/02 0540) Resp:  [17-19] 19 (04/02 0540) BP: (101-134)/(61-81) 134/81 (04/02 0540) SpO2:  [95 %-100 %] 100 % (04/02 0540) Weight change:   Intake/Output from previous day: 04/01 0701 - 04/02 0700 In: 2399.9 [P.O.:920; I.V.:1071.8; IV Piggyback:408.1] Out: 150 [Urine:150] Intake/Output this shift: Total I/O In: 328.7 [P.O.:120; I.V.:108.7; IV Piggyback:100] Out: -   General appearance: cooperative, morbidly obese, pale and slowed mentation Resp: diminished breath sounds bilaterally Cardio: S1, S2 normal and systolic murmur: systolic ejection 2/6, crescendo and decrescendo at 2nd left intercostal space GI: obese, pos bs, soft Extremities: extremities normal, atraumatic, no cyanosis or edema  Lab Results: Recent Labs    04/24/18 0258 04/25/18 0631  WBC 4.4 4.0  HGB 7.6* 7.8*  HCT 26.1* 25.5*  PLT 144* 160   BMET:  Recent Labs    04/25/18 1615 04/26/18 0316  NA 139 136  K 3.0* 2.8*  CL 112* 110  CO2 16* 18*  GLUCOSE 432* 397*  BUN 46* 45*  CREATININE 3.48* 3.26*  CALCIUM 7.6* 7.3*   No results for input(s): PTH in the last 72 hours. Iron Studies: No results for input(s): IRON, TIBC, TRANSFERRIN, FERRITIN in the last 72 hours.  Studies/Results: No results found.  I have reviewed the patient's current medications.  Assessment/Plan: 1 AKI resolving, suspect will not return to baseline with repeated AKI   Acidemic use po, stop iv bicarb.  2 Anemia stable 3 DM per primary, ^^bs, stop iv D5 4 obesity 5 cognitive deficits 6 UTI with sepsis P stop iv , push pos, follow chem, control bs.   LOS: 5 days   Eddie Davies 04/26/2018,10:03 AM

## 2018-04-27 ENCOUNTER — Other Ambulatory Visit: Payer: Self-pay

## 2018-04-27 LAB — CBC
HCT: 26.4 % — ABNORMAL LOW (ref 39.0–52.0)
HCT: 25.7 % — ABNORMAL LOW (ref 39.0–52.0)
Hemoglobin: 8.2 g/dL — ABNORMAL LOW (ref 13.0–17.0)
Hemoglobin: 8.2 g/dL — ABNORMAL LOW (ref 13.0–17.0)
MCH: 29.1 pg (ref 26.0–34.0)
MCH: 29.7 pg (ref 26.0–34.0)
MCHC: 31.1 g/dL (ref 30.0–36.0)
MCHC: 31.9 g/dL (ref 30.0–36.0)
MCV: 93.6 fL (ref 80.0–100.0)
MCV: 93.1 fL (ref 80.0–100.0)
Platelets: 124 10*3/uL — ABNORMAL LOW (ref 150–400)
Platelets: 121 10*3/uL — ABNORMAL LOW (ref 150–400)
RBC: 2.82 MIL/uL — ABNORMAL LOW (ref 4.22–5.81)
RBC: 2.76 MIL/uL — ABNORMAL LOW (ref 4.22–5.81)
RDW: 16.1 % — ABNORMAL HIGH (ref 11.5–15.5)
RDW: 16.4 % — ABNORMAL HIGH (ref 11.5–15.5)
WBC: 3 10*3/uL — ABNORMAL LOW (ref 4.0–10.5)
WBC: 2.8 10*3/uL — ABNORMAL LOW (ref 4.0–10.5)
nRBC: 0 % (ref 0.0–0.2)
nRBC: 0 % (ref 0.0–0.2)

## 2018-04-27 LAB — RENAL FUNCTION PANEL
Albumin: 2.4 g/dL — ABNORMAL LOW (ref 3.5–5.0)
Anion gap: 11 (ref 5–15)
BUN: 46 mg/dL — ABNORMAL HIGH (ref 6–20)
CO2: 14 mmol/L — ABNORMAL LOW (ref 22–32)
Calcium: 7.6 mg/dL — ABNORMAL LOW (ref 8.9–10.3)
Chloride: 118 mmol/L — ABNORMAL HIGH (ref 98–111)
Creatinine, Ser: 2.65 mg/dL — ABNORMAL HIGH (ref 0.61–1.24)
GFR calc Af Amer: 32 mL/min — ABNORMAL LOW (ref >60)
GFR calc non Af Amer: 27 mL/min — ABNORMAL LOW (ref >60)
Glucose, Bld: 165 mg/dL — ABNORMAL HIGH (ref 70–99)
Phosphorus: 7.3 mg/dL — ABNORMAL HIGH (ref 2.5–4.6)
Potassium: 3.4 mmol/L — ABNORMAL LOW (ref 3.5–5.1)
Sodium: 143 mmol/L (ref 135–145)

## 2018-04-27 LAB — CULTURE, BLOOD (ROUTINE X 2)
Culture: NO GROWTH
Culture: NO GROWTH
Special Requests: ADEQUATE
Special Requests: ADEQUATE

## 2018-04-27 LAB — GLUCOSE, CAPILLARY
Glucose-Capillary: 104 mg/dL — ABNORMAL HIGH (ref 70–99)
Glucose-Capillary: 223 mg/dL — ABNORMAL HIGH (ref 70–99)
Glucose-Capillary: 245 mg/dL — ABNORMAL HIGH (ref 70–99)
Glucose-Capillary: 219 mg/dL — ABNORMAL HIGH (ref 70–99)

## 2018-04-27 LAB — MAGNESIUM: Magnesium: 2 mg/dL (ref 1.7–2.4)

## 2018-04-27 MED ORDER — SODIUM BICARBONATE 650 MG PO TABS
1300.0000 mg | ORAL_TABLET | Freq: Three times a day (TID) | ORAL | Status: DC
  Administered 2018-04-27 – 2018-05-05 (×24): 1300 mg via ORAL
  Filled 2018-04-27 (×24): qty 2

## 2018-04-27 NOTE — Progress Notes (Signed)
04/27/18 1634  PT Visit Information  Last PT Received On 04/27/18  Assistance Needed +2  History of Present Illness Patient is a 48 y/o male who presents from [facility] with AMS and acute respiratory failure with hypoxemia; intubated 3/28- 3/30. Recent admission 3/14-3/21 with septic shock from complicated UTI. CXR- RUL infiltrate. PMH includes dm, seizures, htn, esld, esrd ckd stage IV, cognitive delays.   Subjective Data  Patient Stated Goal to be able to transfer better  Precautions  Precautions Fall  Restrictions  Weight Bearing Restrictions No  Pain Assessment  Pain Assessment Faces  Faces Pain Scale 4  Pain Location pt reports his bottom hurts  Pain Descriptors / Indicators Discomfort  Pain Intervention(s) Limited activity within patient's tolerance;Monitored during session;Repositioned  Cognition  Arousal/Alertness Awake/alert  Behavior During Therapy Flat affect  Overall Cognitive Status No family/caregiver present to determine baseline cognitive functioning  General Comments Flat affect, slower response time.  Bed Mobility  Overal bed mobility Needs Assistance  Bed Mobility Sit to Supine  Sit to supine Supervision  General bed mobility comments Supervision for safety. Increased time required.   Transfers  Overall transfer level Needs assistance  Transfer via [place] to/from Stand  Sit to Stand Max assist;+2 physical assistance;Min assist  General transfer comment Max A +2 to stand from lower chair height using stedy. REquired multiple attempts to stand. Worked on prolonged standing, as pt had BM in chair and required clean up. Min A +2 to stand from higher stedy height.   Balance  Overall balance assessment Needs assistance  Sitting-balance support Feet supported;No upper extremity supported  Sitting balance-Leahy Scale Fair  Standing balance support Bilateral upper extremity supported;During functional activity  Standing balance-Leahy  Scale Poor  Standing balance comment Reliant on BUE and external support   PT - End of Session  Equipment Utilized During Treatment Gait belt  Activity Tolerance Patient tolerated treatment well  Patient left in chair;with call bell/phone within reach;with chair alarm set  Nurse Communication Mobility status   PT - Assessment/Plan  PT Plan Current plan remains appropriate  PT Visit Diagnosis Muscle weakness (generalized) (M62.81);Difficulty in walking, not elsewhere classified (R26.2);Unsteadiness on feet (R26.81)  PT Frequency (ACUTE ONLY) Min 2X/week  Follow Up Recommendations SNF;Supervision for mobility/OOB  PT equipment Other (comment) (TBD)  AM-PAC PT "6 Clicks" Mobility Outcome Measure (Version 2)  Help needed turning from your back to your side while in a flat bed without using bedrails? 3  Help needed moving from lying on your back to sitting on the side of a flat bed without using bedrails? 2  Help needed moving to and from a bed to a chair (including a wheelchair)? 1  Help needed standing up from a chair using your arms (e.g., wheelchair or bedside chair)? 1  Help needed to walk in hospital room? 1  Help needed climbing 3-5 steps with a railing?  1  6 Click Score 9  Consider Recommendation of Discharge To: CIR/SNF/LTACH  PT Goal Progression  Progress towards PT goals Progressing toward goals  Acute Rehab PT Goals  PT Goal Formulation With patient  Time For Goal Achievement 05/08/18  Potential to Achieve Goals Fair  PT Time Calculation  PT Start Time (ACUTE ONLY) 1505  PT Stop Time (ACUTE ONLY) 1526  PT Time Calculation (min) (ACUTE ONLY) 21 min  PT General Charges  $$ ACUTE PT VISIT 1 Visit  PT Treatments  $Therapeutic Activity 8-22 mins    Assisted  with transfer back to bed. Pt with increased fatigue and required max A +2 to stand using stedy. Pt also had BM in chair, so worked on standing tolerance for clean up. Current recommendations appropriate. Will continue to  follow acutely to maximize functional mobility independence and safety.   Leighton Ruff, PT, DPT  Acute Rehabilitation Services  Pager: [phone] Office: [phone]

## 2018-04-27 NOTE — Progress Notes (Signed)
Occupational Therapy Treatment Patient Details Name: Eddie Davies MRN: [id number] DOB: [birth year] Today's Date: 04/27/2018    History of present illness Patient is a 48 y/o male who presents from [facility] with AMS and acute respiratory failure with hypoxemia; intubated 3/28- 3/30. Recent admission 3/14-3/21 with septic shock from complicated UTI. CXR- RUL infiltrate. PMH includes dm, seizures, htn, esld, esrd ckd stage IV, cognitive delays.    OT comments  With motivational cues, pt agreeable to therapy requiring maxA+2 for bed mobility to EOB with cues to scoot to edge. Pt modA+2 for sit to stand, but unable to move RLE and BLEs fatigue easily so bari stedy used for transfer and safest option at this time. Pt performing no ADL tasks as pt wanting to return to watch TV. Pt likely close to baseline and would benefit from continued OT to continue to assess safety and assist level of ADL acutely and in next venture of care. OT following acutely.    Follow Up Recommendations  SNF;Supervision/Assistance - 24 hour    Equipment Recommendations  Other (comment)(to be determined)    Recommendations for Other Services      Precautions / Restrictions Precautions Precautions: Fall Restrictions Weight Bearing Restrictions: No       Mobility Bed Mobility Overal bed mobility: Needs Assistance Bed Mobility: Supine to Sit Rolling: Min guard   Supine to sit: Mod assist;+2 for physical assistance     General bed mobility comments: Mod A +2 for LE assist and trunk elevation. Increased time and effort required for bed mobility.   Transfers Overall transfer level: Needs assistance Equipment used: Rolling walker (2 wheeled) Transfers: Sit to/from Stand Sit to Stand: Mod assist;+2 physical assistance         General transfer comment: Performed sit<>Stand X2 with RW. Required mod A +2 for lift assist and steadying and cues for upright. Attempted to take steps towards recliner, however,  pt reports he is unable to move his RLE. Used stedy to perform transfer to chair. Required mod A +2 to stand from bed surface height and min A +2 to stand from higher stedy surface.     Balance Overall balance assessment: Needs assistance Sitting-balance support: Feet supported;No upper extremity supported Sitting balance-Leahy Scale: Fair     Standing balance support: Bilateral upper extremity supported;During functional activity Standing balance-Leahy Scale: Poor Standing balance comment: Reliant on BUE and external support                            ADL either performed or assessed with clinical judgement   ADL Overall ADL's : Needs assistance/impaired                                     Functional mobility during ADLs: Maximal assistance;+2 for physical assistance General ADL Comments: Pt requires assist with ADL for UB and LB.     Vision       Perception     Praxis      Cognition Arousal/Alertness: Awake/alert Behavior During Therapy: Flat affect Overall Cognitive Status: No family/caregiver present to determine baseline cognitive functioning                                 General Comments: Flat affect, slower response time.        Exercises  Shoulder Instructions       General Comments Bari stedy for transfers +2 as feet unable to step, mostly RLE unable to slide or move.    Pertinent Vitals/ Pain       Pain Assessment: Faces Faces Pain Scale: Hurts little more Pain Location: pt reports his bottom hurts Pain Descriptors / Indicators: Discomfort Pain Intervention(s): Limited activity within patient's tolerance  Home Living                                          Prior Functioning/Environment              Frequency  Min 2X/week        Progress Toward Goals  OT Goals(current goals can now be found in the care plan section)  Progress towards OT goals: Progressing toward  goals  Acute Rehab OT Goals Patient Stated Goal: to be able to transfer better OT Goal Formulation: Patient unable to participate in goal setting Time For Goal Achievement: 05/09/18 Potential to Achieve Goals: Good ADL Goals Pt Will Perform Grooming: with modified independence Pt Will Perform Upper Body Dressing: with set-up;sitting Pt Will Perform Lower Body Dressing: with mod assist;sit to/from stand Pt Will Transfer to Toilet: with set-up Pt Will Perform Toileting - Clothing Manipulation and hygiene: with mod assist;sit to/from stand Pt/caregiver will Perform Home Exercise Program: Both right and left upper extremity;With written HEP provided;Independently Additional ADL Goal #1: Pt will perform ADl functional transfers with mod +1 in prep for ADL.  Plan Discharge plan remains appropriate    Co-evaluation    PT/OT/SLP Co-Evaluation/Treatment: Yes Reason for Co-Treatment: Complexity of the patient's impairments (multi-system involvement);To address functional/ADL transfers PT goals addressed during session: Mobility/safety with mobility;Proper use of DME;Balance OT goals addressed during session: ADL's and self-care;Other (comment)(safety)      AM-PAC OT "6 Clicks" Daily Activity     Outcome Measure   Help from another person eating meals?: A Little Help from another person taking care of personal grooming?: A Little Help from another person toileting, which includes using toliet, bedpan, or urinal?: Total Help from another person bathing (including washing, rinsing, drying)?: A Lot Help from another person to put on and taking off regular upper body clothing?: A Lot Help from another person to put on and taking off regular lower body clothing?: Total 6 Click Score: 12    End of Session Equipment Utilized During Treatment: Gait belt;Rolling walker  OT Visit Diagnosis: Unsteadiness on feet (R26.81);Muscle weakness (generalized) (M62.81);Other symptoms and signs involving  cognitive function   Activity Tolerance Patient tolerated treatment well   Patient Left with call bell/phone within reach;in chair;with chair alarm set   Nurse Communication Mobility status        Time: 8325-4982 OT Time Calculation (min): 26 min  Charges: OT General Charges $OT Visit: 1 Visit OT Treatments $Therapeutic Activity: 8-22 mins  Darryl Nestle) Marsa Aris OTR/L Acute Rehabilitation Services Pager: [phone] Office: [phone]    Fredda Hammed 04/27/2018, 3:45 PM

## 2018-04-27 NOTE — Progress Notes (Signed)
Chaplain responded to referral called on pager.  Patient mourning death of his uncle Eddie Davies, who died presumably of Covid at [facility] two days ago. Chaplain offered ministry of presence and prayer.  Patient says he wants his ammonia levels to go down so that he can go back to his SNIF. He's thinking now of his family members missing his uncle. Will be available. Rev. Tamsen Snider Pager [phone]

## 2018-04-27 NOTE — Progress Notes (Signed)
Subjective: Interval History: has no complaint .  Objective: Vital signs in last 24 hours: Temp:  [97.8 F (36.6 C)-97.9 F (36.6 C)] 97.9 F (36.6 C) (04/02 2310) Pulse Rate:  [61-63] 63 (04/02 2310) Resp:  [16-20] 16 (04/02 2310) BP: (122-149)/(51-78) 122/51 (04/02 2310) SpO2:  [98 %] 98 % (04/02 2310) Weight change:   Intake/Output from previous day: 04/02 0701 - 04/03 0700 In: 1088.7 [P.O.:780; I.V.:108.7; IV Piggyback:200] Out: 1225 [Urine:1225] Intake/Output this shift: Total I/O In: -  Out: 400 [Urine:400]  General appearance: cooperative, no distress, morbidly obese and pale Resp: diminished breath sounds bilaterally Cardio: S1, S2 normal and systolic murmur: systolic ejection 2/6, crescendo and decrescendo at 2nd left intercostal space GI: obese, pos bs, striae Extremities: extremities normal, atraumatic, no cyanosis or edema  Lab Results: Recent Labs    04/25/18 0631  WBC 4.0  HGB 7.8*  HCT 25.5*  PLT 160   BMET:  Recent Labs    04/25/18 1615 04/26/18 0316  NA 139 136  K 3.0* 2.8*  CL 112* 110  CO2 16* 18*  GLUCOSE 432* 397*  BUN 46* 45*  CREATININE 3.48* 3.26*  CALCIUM 7.6* 7.3*   No results for input(s): PTH in the last 72 hours. Iron Studies: No results for input(s): IRON, TIBC, TRANSFERRIN, FERRITIN in the last 72 hours.  Studies/Results: No results found.  I have reviewed the patient's current medications.  Assessment/Plan: 1 AKI slowly better, mild acidemia cont po bicarb. Nonoliguric . Can f/u outpatient. 2 Anemia  3 UTI per primary 4 Obesity 5 DM per primary P Doubt will come to baseline but improving. Will cont bicarb, give K, f/u with Dr. Justin Mend outpatient    LOS: 6 days   Eddie Davies Eddie Davies 04/27/2018,10:08 AM

## 2018-04-27 NOTE — TOC Initial Note (Signed)
Transition of Care [facility]) - Initial/Assessment Note    Patient Details  Name: Eddie Davies MRN: [id number] Date of Birth: [birth year]  [facility]) CM/SW Contact:    Benard Halsted, LCSW Phone Number: 04/27/2018, 10:23 AM  Clinical Narrative:                 CSW received consult for possible SNF placement at time of discharge. CSW spoke with patient regarding return to SNF. He states that he is eager to return to [facility]. Patient expressed being hopeful to feel better soon. No further questions reported at this time. CSW checking with SNF to see if they can accept patient with IV antibiotics until 4/6.CSW to continue to follow and assist with discharge planning needs.   Expected Discharge Plan: Skilled Nursing Facility Barriers to Discharge: Continued Medical Work up   Patient Goals and CMS Choice Patient states their goals for this hospitalization and ongoing recovery are:: Return to snf CMS Medicare.gov Compare Post Acute Care list provided to:: Other (Comment Required)(Patient from a SNF) Choice offered to / list presented to : NA  Expected Discharge Plan and Services Expected Discharge Plan: [place] In-house Referral: Clinical Social Work Discharge Planning Services: NA Post Acute Care Choice: [place] Living arrangements for the past 2 months: [place]                 DME Arranged: N/A DME Agency: NA HH Arranged: NA [place] Agency: NA  Prior Living Arrangements/Services Living arrangements for the past 2 months: [place] Lives with:: Facility Resident Patient language and need for interpreter reviewed:: Yes Do you feel safe going back to the place where you live?: Yes      Need for Family Participation in Patient Care: Yes (Comment) Care giver support system in place?: Yes (comment)   Criminal Activity/Legal Involvement Pertinent to Current Situation/Hospitalization: No - Comment as  needed  Activities of Daily Living   ADL Screening (condition at time of admission) Is the patient deaf or have difficulty hearing?: No Does the patient have difficulty seeing, even when wearing glasses/contacts?: No Does the patient have difficulty concentrating, remembering, or making decisions?: No Does the patient have difficulty dressing or bathing?: Yes Does the patient have difficulty walking or climbing stairs?: Yes  Permission Sought/Granted Permission sought to share information with : Facility Sport and exercise psychologist, Family Supports Permission granted to share information with : Yes, Verbal Permission Granted  Share Information with NAME: [name]  Permission granted to share info w AGENCY: [facility]  Permission granted to share info w Relationship: Cousin  Permission granted to share info w Contact Information: [phone]  Emotional Assessment Appearance:: Appears stated age Attitude/Demeanor/Rapport: Unable to Assess Affect (typically observed): Unable to Assess Orientation: : Oriented to Self, Oriented to Place Alcohol / Substance Use: Not Applicable Psych Involvement: No (comment)  Admission diagnosis:  Altered Mental Status  Patient Active Problem List   Diagnosis Date Noted  . Respiratory failure ([place])   . Acute renal failure superimposed on stage 4 chronic kidney disease ([place])   . End stage liver disease ([place])   . Pressure injury of skin 04/21/2018  . Altered mental status, unspecified 04/21/2018  . HCAP (healthcare-associated pneumonia) 04/21/2018  . Acute renal failure (ARF) ([place]) 04/07/2018   PCP:  Practice, [place]:  No Pharmacies Listed    Social Determinants of Health (SDOH) Interventions    Readmission Risk Interventions Readmission Risk Prevention Plan 04/27/2018  Transportation Screening Complete  PCP or Specialist Appt within 5-7 Days Complete  Home Care Screening Complete  Medication Review (RN CM) Complete   Some recent data might be hidden

## 2018-04-27 NOTE — Progress Notes (Addendum)
Patient has drank two pitchers full of water by lunch time.

## 2018-04-27 NOTE — Progress Notes (Signed)
  Speech Language Pathology Treatment: Dysphagia  Patient Details Name: Eddie Davies MRN: [id number] DOB: May 23, 1970 Today's Date: 04/27/2018 Time: 1002-1010 SLP Time Calculation (min) (ACUTE ONLY): 8 min  Assessment / Plan / Recommendation Clinical Impression  Pt demonstrates no signs of aspiration with continuous straw intake of liquids, over 5 oz x2. Tolerated meals well, no complaints. Will sign off.   HPI HPI: 48 yo male with h/o dm, seizures, htn, esld, esrd ckd stage IV presents as transfer from [facility] for [place] requiring intubation 3/28-3/30.  The pt had an admission 3/14- 3/21 for encephalopathy with subsequent intubation, recommended for regular solids/thin liquids by SLP at that time. Per chart, he has reportedly been refusing dialysis and meds for ESLD at SNF.      SLP Plan  All goals met       Recommendations  Diet recommendations: Regular;Thin liquid Liquids provided via: Cup;Straw Medication Administration: Whole meds with liquid Supervision: Patient able to self feed Compensations: Minimize environmental distractions;Slow rate;Small sips/bites Postural Changes and/or Swallow Maneuvers: Seated upright 90 degrees                Plan: All goals met       GO               Herbie [place], MA CCC-SLP  Acute Rehabilitation Services Pager [phone] Office [phone]  Lynann Beaver 04/27/2018, 10:14 AM

## 2018-04-27 NOTE — TOC Progression Note (Signed)
Transition of Care [facility]) - Progression Note    Patient Details  Name: HEBERTO STURDEVANT MRN: [id number] Date of Birth: 1970-11-02  Transition of Care [facility]) CM/SW [place], LCSW Phone Number: 04/27/2018, 2:01 PM  Clinical Narrative:    Per [facility], they are unable to accept patient back without COVID testing. MD and CSW AD aware.    Expected Discharge Plan: [place] Barriers to Discharge: Continued Medical Work up  Expected Discharge Plan and Services Expected Discharge Plan: [place] In-house Referral: Clinical Social Work Discharge Planning Services: NA Post Acute Care Choice: [place] Living arrangements for the past 2 months: [place]                 DME Arranged: N/A DME Agency: NA HH Arranged: NA HH Agency: NA   Social Determinants of Health (SDOH) Interventions    Readmission Risk Interventions Readmission Risk Prevention Plan 04/27/2018  Transportation Screening Complete  PCP or Specialist Appt within 5-7 Days Complete  Home Care Screening Complete  Medication Review (RN CM) Complete  Some recent data might be hidden

## 2018-04-27 NOTE — Progress Notes (Signed)
Physical Therapy Treatment Patient Details Name: Eddie Davies MRN: [id number] DOB: 1970/04/16 Today's Date: 04/27/2018    History of Present Illness Patient is a 48 y/o male who presents from [facility] with AMS and acute respiratory failure with hypoxemia; intubated 3/28- 3/30. Recent admission 3/14-3/21 with septic shock from complicated UTI. CXR- RUL infiltrate. PMH includes dm, seizures, htn, esld, esrd ckd stage IV, cognitive delays.     PT Comments    Pt progressing towards goals. Pt requiring mod A +2 to stand this session. Attempted to take pivotal steps to chair, however, unable to perform. Used stedy to transfer to chair this session. Current recommendations appropriate. Will continue to follow acutely to maximize functional mobility independence and safety.    Follow Up Recommendations  SNF;Supervision for mobility/OOB     Equipment Recommendations  Other (comment)(TBD)    Recommendations for Other Services       Precautions / Restrictions Precautions Precautions: Fall Restrictions Weight Bearing Restrictions: No    Mobility  Bed Mobility Overal bed mobility: Needs Assistance Bed Mobility: Supine to Sit     Supine to sit: Mod assist;+2 for physical assistance     General bed mobility comments: Mod A +2 for LE assist and trunk elevation. Increased time and effort required for bed mobility.   Transfers Overall transfer level: Needs assistance Equipment used: Rolling walker (2 wheeled) Transfers: Sit to/from Stand Sit to Stand: Mod assist;+2 physical assistance         General transfer comment: Performed sit<>Stand X2 with RW. Required mod A +2 for lift assist and steadying and cues for upright. Attempted to take steps towards recliner, however, pt reports he is unable to move his RLE. Used stedy to perform transfer to chair. Required mod A +2 to stand from bed surface height and min A +2 to stand from higher stedy surface.   Ambulation/Gait                  Stairs             Wheelchair Mobility    Modified Rankin (Stroke Patients Only)       Balance Overall balance assessment: Needs assistance Sitting-balance support: Feet supported;No upper extremity supported Sitting balance-Leahy Scale: Fair     Standing balance support: Bilateral upper extremity supported;During functional activity Standing balance-Leahy Scale: Poor Standing balance comment: Reliant on BUE and external support                             Cognition Arousal/Alertness: Awake/alert Behavior During Therapy: Flat affect Overall Cognitive Status: No family/caregiver present to determine baseline cognitive functioning                                 General Comments: Slower response time noted, however, pt responding appropriately. Very flat affect throughout session.       Exercises      General Comments        Pertinent Vitals/Pain Pain Assessment: Faces Faces Pain Scale: Hurts little more Pain Location: pt reports his bottom hurts Pain Descriptors / Indicators: Discomfort Pain Intervention(s): Limited activity within patient's tolerance;Monitored during session;Repositioned    Home Living                      Prior Function            PT Goals (current  goals can now be found in the care plan section) Acute Rehab PT Goals Patient Stated Goal: to be able to transfer better PT Goal Formulation: With patient Time For Goal Achievement: 05/08/18 Potential to Achieve Goals: Fair Progress towards PT goals: Progressing toward goals    Frequency    Min 2X/week      PT Plan Current plan remains appropriate    Co-evaluation PT/OT/SLP Co-Evaluation/Treatment: Yes Reason for Co-Treatment: For patient/therapist safety;To address functional/ADL transfers PT goals addressed during session: Mobility/safety with mobility;Proper use of DME;Balance        AM-PAC PT "6 Clicks" Mobility    Outcome Measure  Help needed turning from your back to your side while in a flat bed without using bedrails?: A Little Help needed moving from lying on your back to sitting on the side of a flat bed without using bedrails?: A Lot Help needed moving to and from a bed to a chair (including a wheelchair)?: Total Help needed standing up from a chair using your arms (e.g., wheelchair or bedside chair)?: A Lot Help needed to walk in hospital room?: Total Help needed climbing 3-5 steps with a railing? : Total 6 Click Score: 10    End of Session Equipment Utilized During Treatment: Gait belt Activity Tolerance: Patient tolerated treatment well Patient left: in chair;with call bell/phone within reach;with chair alarm set Nurse Communication: Mobility status;Other (comment)(used stedy) PT Visit Diagnosis: Muscle weakness (generalized) (M62.81);Difficulty in walking, not elsewhere classified (R26.2);Unsteadiness on feet (R26.81)     Time: 9437-0052 PT Time Calculation (min) (ACUTE ONLY): 27 min  Charges:  $Therapeutic Activity: 8-22 mins                     Eddie Davies, PT, DPT  Acute Rehabilitation Services  Pager: [phone] Office: [phone]    Eddie Davies 04/27/2018, 12:18 PM

## 2018-04-27 NOTE — Progress Notes (Signed)
PROGRESS NOTE        PATIENT DETAILS Name: Eddie Davies Age: 48 y.o. Sex: male Date of Birth: Dec 06, 1970 Admit Date: 04/21/2018 Admitting Physician Collene Gobble, MD [id number], [facility]  Brief Narrative: Patient is a 48 y.o. male with history of with history of CKD stage 3, presumed liver cirrhosis, hypertension, seizures-transferred from [facility] for altered mental status, acute hypoxic respiratory failure in the setting of ESBL Klebsiella pneumoniae HCAP.  Per H&P, patient was refusing medications for ESLD, and had recently refused HD.  Patient was managed in the intensive care unit, extubated on 3/30-and transferred to the [facility] service.  See below for further details.  Subjective: Comfortably in bed-completely awake and alert-no chest pain or shortness of breath.  Assessment/Plan: Acute metabolic encephalopathy: Resolved-he is completely awake and alert.  Suspect encephalopathy was multifactorial-secondary to hypoxia, pneumonia, worsening renal function.  Acute hypoxic respiratory failure: Intubated for airway protection-some amount of hypoxia is likely secondary to right upper lobe infiltrate.  Sputum culture positive for ESBL Klebsiella pneumonia-continue meropenem.  He was extubated on 3/30-currently on room air.  Lungs are clear to auscultation.    ESBL Klebsiella pneumonia: Continue meropenem for total of 7 days-last dose on 4/6.  AKI on CKD stage III: Likely hemodynamically mediated in the setting of hypotension, UTI-renal function improving-nephrology following.  Awaiting morning labs this morning  Uncontrolled DM-2 with hyperglycemia: CBGs improved-continue Lantus 15 units twice daily and SSI moderate scale.  We will continue to follow and adjust accordingly.  Anemia: Likely secondary to acute illness on top of anemia of CKD.  No evidence of bleeding-follow hemoglobin periodically.  Has been transfused 1 unit  of PRBC on 3/30.  Follow periodically  Hypokalemia: Has been repleted-awaiting repeat labs this morning  Presumed liver cirrhosis: Appears to be well compensated at present-he is awake and alert with no evidence of encephalopathy-remains on rifaximin and lactulose.  Seizure disorder: Stable-continue phenobarbital and Tegretol  Hypertension: BP controlled-continue monitoring without the use of any antihypertensives  BPH: Continue Flomax  Cognitive delay/deconditioning: Mostly wheelchair bound-has some amount of cognitive delay-resident of SNF   DVT Prophylaxis:  Heparin   Code Status: Full code  Family Communication: None at bedside  Disposition Plan: Remain inpatient-SNF on discharge over the next few days  Antimicrobial agents: Anti-infectives (From admission, onward)   Start     Dose/Rate Route Frequency Ordered Stop   04/25/18 1800  meropenem (MERREM) 1 g in sodium chloride 0.9 % 100 mL IVPB     1 g 200 mL/hr over 30 Minutes Intravenous Every 12 hours 04/25/18 1641     04/24/18 2200  rifaximin (XIFAXAN) tablet 550 mg     550 mg Oral 2 times daily 04/24/18 1810     04/24/18 1200  meropenem (MERREM) 500 mg in sodium chloride 0.9 % 100 mL IVPB  Status:  Discontinued     500 mg 200 mL/hr over 30 Minutes Intravenous Every 24 hours 04/24/18 1044 04/25/18 1641   04/21/18 2359  ceFEPIme (MAXIPIME) 1 g in sodium chloride 0.9 % 100 mL IVPB  Status:  Discontinued     1 g 200 mL/hr over 30 Minutes Intravenous Daily at bedtime 04/21/18 2247 04/24/18 1044   04/21/18 2359  vancomycin (VANCOCIN) 2,000 mg in sodium chloride 0.9 % 500 mL IVPB     2,000 mg 250  mL/hr over 120 Minutes Intravenous  Once 04/21/18 2316 04/22/18 0305   04/21/18 2319  vancomycin variable dose per unstable renal function (pharmacist dosing)  Status:  Discontinued      Does not apply See admin instructions 04/21/18 2319 04/24/18 1044   04/21/18 2230  rifaximin (XIFAXAN) tablet 550 mg  Status:  Discontinued      550 mg Per Tube 2 times daily 04/21/18 2223 04/24/18 1810      Procedures: None  CONSULTS: PCCM Renal  Time spent: 25 minutes-Greater than 50% of this time was spent in counseling, explanation of diagnosis, planning of further management, and coordination of care.  MEDICATIONS: Scheduled Meds: . calcium acetate  1,334 mg Oral TID WC  . carbamazepine  600 mg Oral BID  . chlorhexidine gluconate (MEDLINE KIT)  15 mL Mouth Rinse BID  . feeding supplement (NEPRO CARB STEADY)  237 mL Oral BID BM  . heparin injection (subcutaneous)  5,000 Units Subcutaneous Q8H  . hydrocerin   Topical BID  . insulin aspart  0-15 Units Subcutaneous TID WC  . insulin glargine  15 Units Subcutaneous BID  . lactulose  30 g Oral TID  . mouth rinse  15 mL Mouth Rinse QID  . multivitamin  1 tablet Oral QHS  . phenobarbital  97.2 mg Oral QHS  . potassium chloride  40 mEq Oral BID  . rifaximin  550 mg Oral BID  . senna-docusate  1 tablet Oral BID  . sodium bicarbonate  1,300 mg Oral TID  . sodium chloride flush  10-40 mL Intracatheter Q12H  . tamsulosin  0.4 mg Oral QHS   Continuous Infusions: . meropenem (MERREM) IV 1 g (04/27/18 0907)   PRN Meds:.sodium chloride flush   PHYSICAL EXAM: Vital signs: Vitals:   04/25/18 2136 04/26/18 0540 04/26/18 1351 04/26/18 2310  BP: 101/61 134/81 (!) 149/78 (!) 122/51  Pulse: 65 62 61 63  Resp: _0 Temp: (!) 97.4 F (36.3 C) 97.6 F (36.4 C) 97.8 F (36.6 C) 97.9 F (36.6 C)  TempSrc: Oral Oral Oral Oral  SpO2: 100% 100% 98% 98%  Weight:      Height:       Filed Weights   04/22/18 0000 04/23/18 0000  Weight: 130.6 kg (!) 136.6 kg   Body mass index is 40.84 kg/m.   General appearance:Awake, alert, not in any distress.  Eyes:no scleral icterus. HEENT: Atraumatic and Normocephalic Neck: supple, no JVD. Resp:Good air entry bilaterally,no rales or rhonchi CVS: S1 S2 regular  GI: Bowel sounds present, Non tender and not distended with no  gaurding, rigidity or rebound. Extremities: B/L Lower Ext shows no edema, both legs are warm to touch Neurology:  Non focal Musculoskeletal:No digital cyanosis Skin:No Rash, warm and dry Wounds:N/A  I have personally reviewed following labs and imaging studies  LABORATORY DATA: CBC: Recent Labs  Lab 04/21/18 2209  04/22/18 1802 04/22/18 1839 04/23/18 0435 04/23/18 1724 04/24/18 0258 04/25/18 0631  WBC 5.6  --  6.5  --  4.6  --  4.4 4.0  NEUTROABS 4.3  --   --   --   --   --   --   --   HGB 7.3*   < > 6.7* 8.8* 6.0* 7.7* 7.6* 7.8*  HCT 25.4*   < > 22.6* 26.0* 20.4* 26.4* 26.1* 25.5*  MCV 99.6  --  98.7  --  99.5  --  99.6 98.1  PLT 132*  --  148*  --  140*  --  144* 160   < > = values in this interval not displayed.    Basic Metabolic Panel: Recent Labs  Lab 04/23/18 0435 04/23/18 1724 04/24/18 0258 04/25/18 0631 04/25/18 1615 04/26/18 0316  NA 148* 148* 149* 139 139 136  K 2.7* 3.4* 3.3* 3.4* 3.0* 2.8*  CL 120* 118* 117* 110 112* 110  CO2 18* 20* 21* 13* 16* 18*  GLUCOSE 143* 126* 158* 468* 432* 397*  BUN 50* 49* 48* 45* 46* 45*  CREATININE 5.70* 4.95* 4.51* 3.86* 3.48* 3.26*  CALCIUM 7.3* 7.7* 7.8* 7.5* 7.6* 7.3*  MG 2.7*  --  2.6*  --   --   --   PHOS 10.9*  --  10.7* 10.2*  --  8.3*    GFR: Estimated Creatinine Clearance: 40.1 mL/min (A) (by C-G formula based on SCr of 3.26 mg/dL (H)).  Liver Function Tests: Recent Labs  Lab 04/21/18 2209 04/23/18 0435 04/24/18 0258 04/25/18 0631 04/26/18 0316  AST 7* 11*  --  11*  --   ALT 9 7  --  9  --   ALKPHOS 121 109  --  118  --   BILITOT 0.3 0.9  --  1.2  --   PROT 5.7* 5.0*  --  5.6*  --   ALBUMIN 2.3* 2.0* 2.1* 2.2* 2.1*   No results for input(s): LIPASE, AMYLASE in the last 168 hours. Recent Labs  Lab 04/21/18 2209 04/22/18 1802 04/24/18 0258  AMMONIA 35 37* 60*    Coagulation Profile: No results for input(s): INR, PROTIME in the last 168 hours.  Cardiac Enzymes: Recent Labs  Lab 04/24/18  0258  CKTOTAL 26*    BNP (last 3 results) No results for input(s): PROBNP in the last 8760 hours.  HbA1C: No results for input(s): HGBA1C in the last 72 hours.  CBG: Recent Labs  Lab 04/26/18 0759 04/26/18 1236 04/26/18 1710 04/26/18 2306 04/27/18 0812  GLUCAP 325* 323* 323* 201* 104*    Lipid Profile: No results for input(s): CHOL, HDL, LDLCALC, TRIG, CHOLHDL, LDLDIRECT in the last 72 hours.  Thyroid Function Tests: No results for input(s): TSH, T4TOTAL, FREET4, T3FREE, THYROIDAB in the last 72 hours.  Anemia Panel: Recent Labs    04/25/18 0631  VITAMINB12 567  FOLATE 10.0    Urine analysis:    Component Value Date/Time   COLORURINE AMBER (A) 04/08/2018 1306   APPEARANCEUR CLOUDY (A) 04/08/2018 1306   LABSPEC 1.018 04/08/2018 1306   PHURINE 5.0 04/08/2018 1306   GLUCOSEU NEGATIVE 04/08/2018 1306   HGBUR MODERATE (A) 04/08/2018 1306   BILIRUBINUR NEGATIVE 04/08/2018 1306   KETONESUR NEGATIVE 04/08/2018 1306   PROTEINUR 100 (A) 04/08/2018 1306   NITRITE NEGATIVE 04/08/2018 1306   LEUKOCYTESUR LARGE (A) 04/08/2018 1306    Sepsis Labs: Lactic Acid, Venous    Component Value Date/Time   LATICACIDVEN 0.9 04/22/2018 0419    MICROBIOLOGY: Recent Results (from the past 240 hour(s))  MRSA PCR Screening     Status: None   Collection Time: 04/21/18  9:33 PM  Result Value Ref Range Status   MRSA by PCR NEGATIVE NEGATIVE Final    Comment:        The GeneXpert MRSA Assay (FDA approved for NASAL specimens only), is one component of a comprehensive MRSA colonization surveillance program. It is not intended to diagnose MRSA infection nor to guide or monitor treatment for MRSA infections. Performed at [facility], [place] [address] [place], [place] [address]  Culture, blood (routine x 2) Call MD if unable to obtain prior to antibiotics being given     Status: None (Preliminary result)   Collection Time: 04/21/18 11:24 PM  Result Value Ref Range  Status   Specimen Description BLOOD RIGHT ANTECUBITAL  Final   Special Requests   Final    BOTTLES DRAWN AEROBIC AND ANAEROBIC Blood Culture adequate volume   Culture   Final    NO GROWTH 4 DAYS Performed at [place] [address] [place], [place] [address]    Report Status PENDING  Incomplete  Culture, blood (routine x 2) Call MD if unable to obtain prior to antibiotics being given     Status: None (Preliminary result)   Collection Time: 04/21/18 11:24 PM  Result Value Ref Range Status   Specimen Description BLOOD RIGHT ANTECUBITAL  Final   Special Requests   Final    BOTTLES DRAWN AEROBIC AND ANAEROBIC Blood Culture adequate volume   Culture   Final    NO GROWTH 4 DAYS Performed at [facility], [place] [address] [place], [place] [address]    Report Status PENDING  Incomplete  Culture, sputum-assessment     Status: None   Collection Time: 04/22/18 10:27 AM  Result Value Ref Range Status   Specimen Description TRACHEAL ASPIRATE  Final   Special Requests Normal  Final   Sputum evaluation   Final    THIS SPECIMEN IS ACCEPTABLE FOR SPUTUM CULTURE Performed at [facility], [address] [place], [place] [address]    Report Status 04/22/2018 FINAL  Final  Culture, respiratory     Status: None   Collection Time: 04/22/18 10:27 AM  Result Value Ref Range Status   Specimen Description TRACHEAL ASPIRATE  Final   Special Requests Normal Reflexed from [id number]  Final   Gram Stain   Final    FEW WBC PRESENT, PREDOMINANTLY PMN MODERATE GRAM NEGATIVE RODS RARE BUDDING YEAST SEEN Performed at [facility], [place] [address], [place], [place] [address]    Culture   Final    ABUNDANT KLEBSIELLA PNEUMONIAE Confirmed Extended Spectrum Beta-Lactamase Producer (ESBL).  In bloodstream infections from ESBL organisms, carbapenems are preferred over piperacillin/tazobactam. They are shown to have a lower risk of mortality.    Report Status 04/24/2018 FINAL  Final    Organism ID, Bacteria KLEBSIELLA PNEUMONIAE  Final      Susceptibility   Klebsiella pneumoniae - MIC*    AMPICILLIN >=32 RESISTANT Resistant     CEFAZOLIN >=64 RESISTANT Resistant     CEFEPIME RESISTANT Resistant     CEFTAZIDIME RESISTANT Resistant     CEFTRIAXONE RESISTANT Resistant     CIPROFLOXACIN 1 SENSITIVE Sensitive     GENTAMICIN 8 INTERMEDIATE Intermediate     IMIPENEM <=0.25 SENSITIVE Sensitive     TRIMETH/SULFA >=320 RESISTANT Resistant     AMPICILLIN/SULBACTAM 8 SENSITIVE Sensitive     PIP/TAZO <=4 SENSITIVE Sensitive     Extended ESBL POSITIVE Resistant     * ABUNDANT KLEBSIELLA PNEUMONIAE  Culture, Urine     Status: Abnormal   Collection Time: 04/23/18 11:10 AM  Result Value Ref Range Status   Specimen Description URINE, CATHETERIZED  Final   Special Requests   Final    NONE Performed at [facility], [place] [address], [place], [place] [address]    Culture (A)  Final    10,000 COLONIES/mL HAFNIA ALVEI 10,000 COLONIES/mL YEAST    Report Status  04/25/2018 FINAL  Final   Organism ID, Bacteria HAFNIA ALVEI (A)  Final      Susceptibility   Hafnia alvei - MIC*    AMPICILLIN >=32 RESISTANT Resistant     CEFAZOLIN >=64 RESISTANT Resistant     CEFTRIAXONE 32 INTERMEDIATE Intermediate     CIPROFLOXACIN <=0.25 SENSITIVE Sensitive     GENTAMICIN <=1 SENSITIVE Sensitive     IMIPENEM <=0.25 SENSITIVE Sensitive     NITROFURANTOIN <=16 SENSITIVE Sensitive     TRIMETH/SULFA <=20 SENSITIVE Sensitive     AMPICILLIN/SULBACTAM >=32 RESISTANT Resistant     PIP/TAZO >=128 RESISTANT Resistant     * 10,000 COLONIES/mL HAFNIA ALVEI    RADIOLOGY STUDIES/RESULTS: Dg Chest 1 View  Result Date: 04/08/2018 CLINICAL DATA:  Hemodialysis catheter insertion. EXAM: CHEST  1 VIEW COMPARISON:  04/08/2018 FINDINGS: There is a right IJ central venous catheter in the mid SVC. The catheter tip is long lateral wall near the brachiocephalic SVC junction. The endotracheal tube is in good  position at the mid tracheal level. There is an NG tube coursing down the esophagus and into the stomach. Stable cardiac enlargement and prominent mediastinum. Persistent pulmonary edema and pleural effusions. Slight improved lung aeration when compared to earlier film. IMPRESSION: 1. Left IJ central venous catheter tip in the SVC near the brachiocephalic SVC junction. 2. Right subclavian central venous catheter is stable. 3. Stable ET and NG tubes. 4. Persistent cardiac enlargement, central vascular congestion and pulmonary edema but slight improved aeration since earlier film. 5. Small effusions. Electronically Signed   By: Marijo Sanes M.D.   On: 04/08/2018 13:11   Dg Abd 1 View  Result Date: 04/22/2018 CLINICAL DATA:  OG tube placement. EXAM: ABDOMEN - 1 VIEW COMPARISON:  None. FINDINGS: Tip and side port of the enteric tube below the diaphragm in the stomach. Single prominent bowel loop in the upper abdomen is likely transverse colon. Cholecystectomy clips. IMPRESSION: Tip and side port of the enteric tube below the diaphragm in the stomach. Electronically Signed   By: Keith Rake M.D.   On: 04/22/2018 00:06   US Renal  Result Date: 04/07/2018 CLINICAL DATA:  Acute renal failure. EXAM: RENAL / URINARY TRACT ULTRASOUND COMPLETE COMPARISON:  Yesterday. FINDINGS: Right Kidney: Renal measurements: 13.2 x 6.8 x 6 0 cm = volume: 281 mL . Echogenicity within normal limits. No mass or hydronephrosis visualized. Left Kidney: Renal measurements: 13.2 x 6 0 x 5.2 cm = volume: 215 mL. Echogenicity within normal limits. No mass or hydronephrosis visualized. Bladder: Not visualized with a Foley catheter in place. IMPRESSION: 1. Normal kidneys without hydronephrosis. 2. Nonvisualized urinary bladder with a Foley catheter in place. Electronically Signed   By: Claudie Revering M.D.   On: 04/07/2018 19:38   Dg Chest Port 1 View  Result Date: 04/23/2018 CLINICAL DATA:  Intubated patient admitted 04/21/2018 with  altered mental status. EXAM: PORTABLE CHEST 1 VIEW COMPARISON:  Single-view of the chest 04/21/2018 and 04/12/2018. FINDINGS: Right IJ catheter tip projects in the right subclavian vein, unchanged. NG tube courses into the stomach and below the inferior margin the film. Endotracheal tube tip projects at the thoracic inlet and should be advanced approximately 3 cm. Cardiomegaly is again seen. Bilateral airspace disease persists but aeration in the right upper lung zone has improved. IMPRESSION: ETT tip projects at the thoracic inlet. The tube should be advanced approximately 3 cm. Bilateral airspace disease persist but aeration in the right upper lobe is markedly improved. Electronically Signed  By: Inge Rise M.D.   On: 04/23/2018 07:51   Dg Chest Port 1 View  Result Date: 04/21/2018 CLINICAL DATA:  Respiratory failure EXAM: PORTABLE CHEST 1 VIEW COMPARISON:  04/21/2018 FINDINGS: Interval placement of esophagogastric tube, tip and side-port appear to be below the diaphragm although the lower portion of the examination is very underpenetrated, limiting visualization. Slight interval improvement in aeration of the right upper lobe. Otherwise unchanged examination with endotracheal tube projecting below the thoracic inlet, right neck vascular catheter, and cardiomegaly. IMPRESSION: Interval placement of esophagogastric tube, tip and side-port appear to be below the diaphragm although the lower portion of the examination is very underpenetrated, limiting visualization. Slight interval improvement in aeration of the right upper lobe. Otherwise unchanged examination with endotracheal tube projecting below the thoracic inlet, right neck vascular catheter, and cardiomegaly. Electronically Signed   By: Eddie Candle M.D.   On: 04/21/2018 23:01   Dg Chest Port 1 View  Result Date: 04/12/2018 CLINICAL DATA:  Abnormal respirations. EXAM: PORTABLE CHEST 1 VIEW COMPARISON:  04/10/2018. FINDINGS: Interim extubation  and removal of NG tube. Left IJ line noted with tip over superior vena cava. Cardiomegaly. Diffuse bilateral pulmonary infiltrates/edema. Improved aeration right upper lung. No pleural effusion or pneumothorax. IMPRESSION: 1. Interim extubation and removal of NG tube. Left IJ line stable position. 2.  Cardiomegaly, no interim change. 3. Diffuse bilateral pulmonary infiltrates/edema noted. Improved aeration right upper lung. Electronically Signed   By: Marcello Moores  Register   On: 04/12/2018 06:19   Dg Chest Port 1 View  Result Date: 04/10/2018 CLINICAL DATA:  Hypoxia EXAM: PORTABLE CHEST 1 VIEW COMPARISON:  April 09, 2018 FINDINGS: Endotracheal tube tip is 7.5 cm above the carina. Central catheter tip is at the junction of the superior vena cava and left innominate vein. Nasogastric tube tip and side port are below the diaphragm. No pneumothorax. : There is patchy atelectasis in the left lower lobe and right upper lobe regions. There is no appreciable edema or consolidation. Heart is mildly enlarged with pulmonary vascularity normal. No adenopathy. No bone lesions. IMPRESSION: Tube and catheter positions as described without pneumothorax. Areas of patchy atelectasis bilaterally. No frank consolidation or edema. Stable cardiac enlargement. Electronically Signed   By: Lowella Grip III M.D.   On: 04/10/2018 07:16   Dg Chest Port 1 View  Result Date: 04/09/2018 CLINICAL DATA:  Shortness of breath EXAM: PORTABLE CHEST 1 VIEW COMPARISON:  04/08/2018 FINDINGS: Endotracheal tube tip is at the level of the clavicular heads. Left IJ central venous catheter tip is in the mid SVC. Orogastric tube tip is beyond the field of view. There is moderate cardiomegaly with mild pulmonary edema and small pleural effusions. Right subclavian approach central venous catheter is unchanged. IMPRESSION: Cardiomegaly, small pleural effusions and mild pulmonary edema. Unchanged support apparatus. Electronically Signed   By: Ulyses Jarred  M.D.   On: 04/09/2018 06:18   Dg Chest Port 1 View  Result Date: 04/08/2018 CLINICAL DATA:  Respiratory failure EXAM: PORTABLE CHEST 1 VIEW COMPARISON:  04/07/2018 FINDINGS: Support Apparatus: --Endotracheal tube: Tip at the level of the clavicular heads. --Enteric tube:Tip and sideport are below the field of view. --Catheter(s):Right subclavian vein approach central venous catheter tip is at the lower SVC --Other: None Unchanged cardiomegaly and mild pulmonary edema. Failing pleural effusions are also unchanged. IMPRESSION: Unchanged appearance of the chest with findings of congestive heart failure. Unchanged support apparatus. Electronically Signed   By: Ulyses Jarred M.D.   On: 04/08/2018 05:17  Dg Chest Port 1 View  Result Date: 04/07/2018 CLINICAL DATA:  Acute respiratory failure. EXAM: PORTABLE CHEST 1 VIEW COMPARISON:  Earlier today. FINDINGS: Endotracheal tube in satisfactory position. Stable right central venous catheter. Nasogastric tube extending into the stomach. Progressive enlargement of the cardiac silhouette. Diffuse prominence of the pulmonary vasculature and interstitial markings with less bilateral airspace opacity with an improved inspiration. Unremarkable bones. IMPRESSION: Mildly progressive cardiomegaly with grossly stable changes of congestive heart failure. Electronically Signed   By: Claudie Revering M.D.   On: 04/07/2018 19:42     LOS: 6 days   Oren Binet, MD  [facility]  If 7PM-7AM, please contact night-coverage  Please page via [email or web address]  Go to [email or web address] and use [place]'s universal password to access. If you do not have the password, please contact the hospital operator.  Locate the [facility] provider you are looking for under [facility] and page to a number that you can be directly reached. If you still have difficulty reaching the provider, please page the [facility] (Director on Call) for the Hospitalists listed on amion for assistance.  04/27/2018,  10:20 AM

## 2018-04-27 NOTE — NC FL2 (Addendum)
[place] LEVEL OF CARE SCREENING TOOL     IDENTIFICATION  Patient Name: Eddie Davies Birthdate: 11-Jun-1970 Sex: male Admission Date (Current Location): 04/21/2018  [facility] and [place] Number:  [employer] and Address:  [facility] [place], [place] [address], [place], [place] [address]      Provider Number: [id number]  Attending Physician Name and Address:  [name], MD  Relative Name and Phone Number:       Current Level of Care: Hospital Recommended Level of Care: [place] Prior Approval Number:    Date Approved/Denied:   PASRR Number:    Discharge Plan: SNF    Current Diagnoses: Patient Active Problem List   Diagnosis Date Noted  . Respiratory failure ([place])   . Acute renal failure superimposed on stage 4 chronic kidney disease ([place])   . End stage liver disease ([place])   . Pressure injury of skin 04/21/2018  . Altered mental status, unspecified 04/21/2018  . HCAP (healthcare-associated pneumonia) 04/21/2018  . Acute renal failure (ARF) ([place]) 04/07/2018    Orientation RESPIRATION BLADDER Height & Weight     Time, Self, Place  O2(Nasal cannula 3L) Continent Weight: (!) 301 lb 2.4 oz (136.6 kg) Height:  6' (182.9 cm)(per pt and measurement verification RNs present)  BEHAVIORAL SYMPTOMS/MOOD NEUROLOGICAL BOWEL NUTRITION STATUS      Incontinent(Rectal tube) Diet(Please see DC Summary)  AMBULATORY STATUS COMMUNICATION OF NEEDS Skin   Extensive Assist Verbally                         Personal Care Assistance Level of Assistance  Bathing, Feeding, Dressing Bathing Assistance: Maximum assistance Feeding assistance: Limited assistance Dressing Assistance: Maximum assistance     Functional Limitations Info  Sight, Hearing, Speech Sight Info: Adequate Hearing Info: Adequate Speech Info: Adequate    SPECIAL CARE FACTORS FREQUENCY                       Contractures Contractures Info:  Not present    Additional Factors Info  Code Status, Allergies, Insulin Sliding Scale Code Status Info: Full Allergies Info: Fish Oil   Insulin Sliding Scale Info: 3x daily with meals       Current Medications (04/27/2018):  This is the current hospital active medication list Current Facility-Administered Medications  Medication Dose Route Frequency Provider Last Rate Last Dose  . calcium acetate (PHOSLO) capsule 1,334 mg  1,334 mg Oral TID WC Mauricia Area, MD   1,334 mg at 04/27/18 0846  . carbamazepine (TEGRETOL) tablet 600 mg  600 mg Oral BID [name], MD   600 mg at 04/27/18 0843  . chlorhexidine gluconate (MEDLINE KIT) (PERIDEX) 0.12 % solution 15 mL  15 mL Mouth Rinse BID Garrel Ridgel, MD   15 mL at 04/26/18 2222  . feeding supplement (NEPRO CARB STEADY) liquid 237 mL  237 mL Oral BID BM Cherene Altes, MD   237 mL at 04/27/18 0852  . heparin injection 5,000 Units  5,000 Units Subcutaneous Q8H [name], MD   5,000 Units at 04/27/18 [phone]  . hydrocerin (EUCERIN) cream   Topical BID Dellinger, Haynes Dage L, [name]      . insulin aspart (novoLOG) injection 0-15 Units  0-15 Units Subcutaneous TID WC [name], MD   11 Units at 04/26/18 1715  . insulin glargine (LANTUS) injection 15 Units  15 Units Subcutaneous BID [name],  [name], MD   15 Units at 04/27/18 [phone]  . lactulose (CHRONULAC) 10 GM/15ML solution 30 g  30 g Oral TID Cherene Altes, MD   30 g at 04/27/18 0845  . MEDLINE mouth rinse  15 mL Mouth Rinse QID Rigoberto Noel, MD   15 mL at 04/27/18 0608  . meropenem (MERREM) 1 g in sodium chloride 0.9 % 100 mL IVPB  1 g Intravenous Q12H [name], MD 200 mL/hr at 04/27/18 0907 1 g at 04/27/18 0907  . multivitamin (RENA-VIT) tablet 1 tablet  1 tablet Oral QHS Chesley Mires, MD   1 tablet at 04/26/18 2222  . PHENobarbital (LUMINAL) tablet 97.2 mg  97.2 mg Oral QHS Kara Mead V, MD   97.2 mg at 04/26/18 2220  . potassium chloride SA  (K-DUR,KLOR-CON) CR tablet 40 mEq  40 mEq Oral BID Mauricia Area, MD   40 mEq at 04/27/18 0850  . rifaximin (XIFAXAN) tablet 550 mg  550 mg Oral BID Cherene Altes, MD   550 mg at 04/27/18 0849  . senna-docusate (Senokot-S) tablet 1 tablet  1 tablet Oral BID Cherene Altes, MD   1 tablet at 04/27/18 0848  . sodium bicarbonate tablet 1,300 mg  1,300 mg Oral TID Mauricia Area, MD      . sodium chloride flush (NS) 0.9 % injection 10-40 mL  10-40 mL Intracatheter Q12H Chesley Mires, MD   3 mL at 04/27/18 0910  . sodium chloride flush (NS) 0.9 % injection 10-40 mL  10-40 mL Intracatheter PRN Chesley Mires, MD      . tamsulosin (FLOMAX) capsule 0.4 mg  0.4 mg Oral QHS [name], MD   0.4 mg at 04/26/18 2220     Discharge Medications: Please see discharge summary for a list of discharge medications.  Relevant Imaging Results:  Relevant Lab Results:   Additional Information SSN: [id number]   Needs IV Merrem or Invanz until 4/6. Palliative to follow at SNF.   Benard Halsted, LCSW

## 2018-04-28 LAB — GLUCOSE, CAPILLARY
Glucose-Capillary: 91 mg/dL (ref 70–99)
Glucose-Capillary: 116 mg/dL — ABNORMAL HIGH (ref 70–99)
Glucose-Capillary: 132 mg/dL — ABNORMAL HIGH (ref 70–99)
Glucose-Capillary: 120 mg/dL — ABNORMAL HIGH (ref 70–99)

## 2018-04-28 LAB — MAGNESIUM: Magnesium: 1.9 mg/dL (ref 1.7–2.4)

## 2018-04-28 LAB — BASIC METABOLIC PANEL
Anion gap: 9 (ref 5–15)
BUN: 45 mg/dL — ABNORMAL HIGH (ref 6–20)
CO2: 17 mmol/L — ABNORMAL LOW (ref 22–32)
Calcium: 7.4 mg/dL — ABNORMAL LOW (ref 8.9–10.3)
Chloride: 117 mmol/L — ABNORMAL HIGH (ref 98–111)
Creatinine, Ser: 2.21 mg/dL — ABNORMAL HIGH (ref 0.61–1.24)
GFR calc Af Amer: 40 mL/min — ABNORMAL LOW (ref >60)
GFR calc non Af Amer: 34 mL/min — ABNORMAL LOW (ref >60)
Glucose, Bld: 168 mg/dL — ABNORMAL HIGH (ref 70–99)
Potassium: 3.3 mmol/L — ABNORMAL LOW (ref 3.5–5.1)
Sodium: 143 mmol/L (ref 135–145)

## 2018-04-28 MED ORDER — POTASSIUM CHLORIDE CRYS ER 20 MEQ PO TBCR
40.0000 meq | EXTENDED_RELEASE_TABLET | Freq: Two times a day (BID) | ORAL | Status: AC
  Administered 2018-04-28 – 2018-04-29 (×4): 40 meq via ORAL
  Filled 2018-04-28: qty 4
  Filled 2018-04-28 (×3): qty 2

## 2018-04-28 MED ORDER — SODIUM CHLORIDE 0.9 % IV SOLN
1.0000 g | Freq: Three times a day (TID) | INTRAVENOUS | Status: DC
  Administered 2018-04-28 – 2018-05-01 (×9): 1 g via INTRAVENOUS
  Filled 2018-04-28 (×12): qty 1

## 2018-04-28 NOTE — Progress Notes (Signed)
PROGRESS NOTE        PATIENT DETAILS Name: Eddie Davies Age: 48 y.o. Sex: male Date of Birth: 29-Jul-1970 Admit Date: 04/21/2018 Admitting Physician Collene Gobble, MD BZJ:IRCVELFY, [facility]  Brief Narrative: Patient is a 48 y.o. male with history of with history of CKD stage 3, presumed liver cirrhosis, hypertension, seizures-transferred from [facility] for altered mental status, acute hypoxic respiratory failure in the setting of ESBL Klebsiella pneumoniae HCAP.  Per H&P, patient was refusing medications for ESLD, and had recently refused HD.  Patient was managed in the intensive care unit, extubated on 3/30-and transferred to the triad hospitalist service.  See below for further details.  Subjective: Lying comfortably in bed-claims that his uncle passed away yesterday or day before.  He has not seen his uncle in almost a year.  Denies any chest pain or shortness of breath.  Assessment/Plan: Acute metabolic encephalopathy: Resolved-he is completely awake and alert.  Suspect encephalopathy was multifactorial-secondary to hypoxia, pneumonia, worsening renal function.  Acute hypoxic respiratory failure: Intubated for airway protection-some amount of hypoxia is likely secondary to right upper lobe infiltrate.  Sputum culture positive for ESBL Klebsiella pneumonia-continue meropenem.  He was extubated on 3/30-currently on room air.  Lungs are clear to auscultation.    ESBL Klebsiella pneumonia: Afebrile-lungs are clear-on room air-sputum culture positive for.  Continue meropenem for total of 7 days-last dose on 4/6.  Per SW-SNF not taking patient back on third of COVID testing is done-very low risk at this point-and improving with current care.  I have reached out to hospitalist director at this point-we will await further guidance-but at this point do not think there is no indication for any further testing.  AKI on CKD stage III: Likely  hemodynamically mediated in the setting of hypotension, UTI-renal function improving-renal function improving with supportive care, creatinine now approaching usual baseline.  Uncontrolled DM-2 with hyperglycemia: CBGs improved-continue Lantus 15 units twice daily and SSI.  Anemia: Likely secondary to acute illness on top of anemia of CKD.  No evidence of bleeding-follow hemoglobin periodically.  Has been transfused 1 unit of PRBC on 3/30.  Follow periodically  Hypokalemia: Continue to replete and repeat labs in the morning  Presumed liver cirrhosis: Appears to be well compensated at present-he is awake and alert with no evidence of encephalopathy-remains on rifaximin and lactulose.  Seizure disorder: Stable-continue phenobarbital and Tegretol  Hypertension: BP controlled-continue monitoring without the use of any antihypertensives  BPH: Continue Flomax  Cognitive delay/deconditioning: Mostly wheelchair bound-has some amount of cognitive delay-resident of SNF   DVT Prophylaxis:  Heparin   Code Status: Full code  Family Communication: None at bedside  Disposition Plan: Remain inpatient-SNF on discharge over the next few days  Antimicrobial agents: Anti-infectives (From admission, onward)   Start     Dose/Rate Route Frequency Ordered Stop   04/25/18 1800  meropenem (MERREM) 1 g in sodium chloride 0.9 % 100 mL IVPB     1 g 200 mL/hr over 30 Minutes Intravenous Every 12 hours 04/25/18 1641     04/24/18 2200  rifaximin (XIFAXAN) tablet 550 mg     550 mg Oral 2 times daily 04/24/18 1810     04/24/18 1200  meropenem (MERREM) 500 mg in sodium chloride 0.9 % 100 mL IVPB  Status:  Discontinued     500 mg 200  mL/hr over 30 Minutes Intravenous Every 24 hours 04/24/18 1044 04/25/18 1641   04/21/18 2359  ceFEPIme (MAXIPIME) 1 g in sodium chloride 0.9 % 100 mL IVPB  Status:  Discontinued     1 g 200 mL/hr over 30 Minutes Intravenous Daily at bedtime 04/21/18 2247 04/24/18 1044    04/21/18 2359  vancomycin (VANCOCIN) 2,000 mg in sodium chloride 0.9 % 500 mL IVPB     2,000 mg 250 mL/hr over 120 Minutes Intravenous  Once 04/21/18 2316 04/22/18 0305   04/21/18 2319  vancomycin variable dose per unstable renal function (pharmacist dosing)  Status:  Discontinued      Does not apply See admin instructions 04/21/18 2319 04/24/18 1044   04/21/18 2230  rifaximin (XIFAXAN) tablet 550 mg  Status:  Discontinued     550 mg Per Tube 2 times daily 04/21/18 2223 04/24/18 1810      Procedures: None  CONSULTS: PCCM Renal  Time spent: 25 minutes-Greater than 50% of this time was spent in counseling, explanation of diagnosis, planning of further management, and coordination of care.  MEDICATIONS: Scheduled Meds:  calcium acetate  1,334 mg Oral TID WC   carbamazepine  600 mg Oral BID   chlorhexidine gluconate (MEDLINE KIT)  15 mL Mouth Rinse BID   feeding supplement (NEPRO CARB STEADY)  237 mL Oral BID BM   heparin injection (subcutaneous)  5,000 Units Subcutaneous Q8H   hydrocerin   Topical BID   insulin aspart  0-15 Units Subcutaneous TID WC   insulin glargine  15 Units Subcutaneous BID   lactulose  30 g Oral TID   mouth rinse  15 mL Mouth Rinse QID   multivitamin  1 tablet Oral QHS   phenobarbital  97.2 mg Oral QHS   potassium chloride  40 mEq Oral BID   rifaximin  550 mg Oral BID   senna-docusate  1 tablet Oral BID   sodium bicarbonate  1,300 mg Oral TID   sodium chloride flush  10-40 mL Intracatheter Q12H   tamsulosin  0.4 mg Oral QHS   Continuous Infusions:  meropenem (MERREM) IV 1 g (04/28/18 0949)   PRN Meds:.sodium chloride flush   PHYSICAL EXAM: Vital signs: Vitals:   04/27/18 1439 04/27/18 2059 04/28/18 0535 04/28/18 0639  BP: (!) 159/76 (!) 144/78  135/72  Pulse: 62 62  71  Resp: 20 16  16   Temp: 97.9 F (36.6 C) 98.3 F (36.8 C)  97.6 F (36.4 C)  TempSrc: Oral Oral  Oral  SpO2: 100% 100%  99%  Weight:   131.7 kg     Height:       Filed Weights   04/22/18 0000 04/23/18 0000 04/28/18 0535  Weight: 130.6 kg (!) 136.6 kg 131.7 kg   Body mass index is 39.38 kg/m.   General appearance:Awake, alert, not in any distress.  Eyes:no scleral icterus. HEENT: Atraumatic and Normocephalic Neck: supple, no JVD. Resp:Good air entry bilaterally,no added sounds CVS: S1 S2 regular GI: Bowel sounds present, Non tender and not distended with no gaurding, rigidity or rebound. Extremities: B/L Lower Ext shows no edema, both legs are warm to touch Neurology:  Non focal Psychiatric: Normal judgment and insight. Normal mood. Musculoskeletal:No digital cyanosis Skin:No Rash, warm and dry Wounds:N/A  I have personally reviewed following labs and imaging studies  LABORATORY DATA: CBC: Recent Labs  Lab 04/21/18 2209  04/23/18 0435 04/23/18 1724 04/24/18 0258 04/25/18 0631 04/27/18 0925 04/27/18 1048  WBC 5.6   < > 4.6  --  4.4 4.0 3.0* 2.8*  NEUTROABS 4.3  --   --   --   --   --   --   --   HGB 7.3*   < > 6.0* 7.7* 7.6* 7.8* 8.2* 8.2*  HCT 25.4*   < > 20.4* 26.4* 26.1* 25.5* 26.4* 25.7*  MCV 99.6   < > 99.5  --  99.6 98.1 93.6 93.1  PLT 132*   < > 140*  --  144* 160 124* 121*   < > = values in this interval not displayed.    Basic Metabolic Panel: Recent Labs  Lab 04/23/18 0435  04/24/18 0258 04/25/18 0631 04/25/18 1615 04/26/18 0316 04/27/18 0925 04/28/18 0315  NA 148*   < > 149* 139 139 136 143 143  K 2.7*   < > 3.3* 3.4* 3.0* 2.8* 3.4* 3.3*  CL 120*   < > 117* 110 112* 110 118* 117*  CO2 18*   < > 21* 13* 16* 18* 14* 17*  GLUCOSE 143*   < > 158* 468* 432* 397* 165* 168*  BUN 50*   < > 48* 45* 46* 45* 46* 45*  CREATININE 5.70*   < > 4.51* 3.86* 3.48* 3.26* 2.65* 2.21*  CALCIUM 7.3*   < > 7.8* 7.5* 7.6* 7.3* 7.6* 7.4*  MG 2.7*  --  2.6*  --   --   --  2.0 1.9  PHOS 10.9*  --  10.7* 10.2*  --  8.3* 7.3*  --    < > = values in this interval not displayed.    GFR: Estimated Creatinine  Clearance: 58 mL/min (A) (by C-G formula based on SCr of 2.21 mg/dL (H)).  Liver Function Tests: Recent Labs  Lab 04/21/18 2209 04/23/18 0435 04/24/18 0258 04/25/18 0631 04/26/18 0316 04/27/18 0925  AST 7* 11*  --  11*  --   --   ALT 9 7  --  9  --   --   ALKPHOS 121 109  --  118  --   --   BILITOT 0.3 0.9  --  1.2  --   --   PROT 5.7* 5.0*  --  5.6*  --   --   ALBUMIN 2.3* 2.0* 2.1* 2.2* 2.1* 2.4*   No results for input(s): LIPASE, AMYLASE in the last 168 hours. Recent Labs  Lab 04/21/18 2209 04/22/18 1802 04/24/18 0258  AMMONIA 35 37* 60*    Coagulation Profile: No results for input(s): INR, PROTIME in the last 168 hours.  Cardiac Enzymes: Recent Labs  Lab 04/24/18 0258  CKTOTAL 26*    BNP (last 3 results) No results for input(s): PROBNP in the last 8760 hours.  HbA1C: No results for input(s): HGBA1C in the last 72 hours.  CBG: Recent Labs  Lab 04/27/18 0812 04/27/18 1245 04/27/18 1709 04/27/18 2108 04/28/18 0803  GLUCAP 104* 223* 245* 219* 91    Lipid Profile: No results for input(s): CHOL, HDL, LDLCALC, TRIG, CHOLHDL, LDLDIRECT in the last 72 hours.  Thyroid Function Tests: No results for input(s): TSH, T4TOTAL, FREET4, T3FREE, THYROIDAB in the last 72 hours.  Anemia Panel: No results for input(s): VITAMINB12, FOLATE, FERRITIN, TIBC, IRON, RETICCTPCT in the last 72 hours.  Urine analysis:    Component Value Date/Time   COLORURINE AMBER (A) 04/08/2018 1306   APPEARANCEUR CLOUDY (A) 04/08/2018 1306   LABSPEC 1.018 04/08/2018 1306   PHURINE 5.0 04/08/2018 1306   GLUCOSEU NEGATIVE 04/08/2018 1306   HGBUR MODERATE (A) 04/08/2018 1306  BILIRUBINUR NEGATIVE 04/08/2018 [place] 04/08/2018 1306   PROTEINUR 100 (A) 04/08/2018 1306   NITRITE NEGATIVE 04/08/2018 1306   LEUKOCYTESUR LARGE (A) 04/08/2018 1306    Sepsis Labs: Lactic Acid, Venous    Component Value Date/Time   LATICACIDVEN 0.9 04/22/2018 0419     MICROBIOLOGY: Recent Results (from the past 240 hour(s))  MRSA PCR Screening     Status: None   Collection Time: 04/21/18  9:33 PM  Result Value Ref Range Status   MRSA by PCR NEGATIVE NEGATIVE Final    Comment:        The GeneXpert MRSA Assay (FDA approved for NASAL specimens only), is one component of a comprehensive MRSA colonization surveillance program. It is not intended to diagnose MRSA infection nor to guide or monitor treatment for MRSA infections. Performed at [facility], [place] [address] [place], [place] [address]   Culture, blood (routine x 2) Call MD if unable to obtain prior to antibiotics being given     Status: None   Collection Time: 04/21/18 11:24 PM  Result Value Ref Range Status   Specimen Description BLOOD RIGHT ANTECUBITAL  Final   Special Requests   Final    BOTTLES DRAWN AEROBIC AND ANAEROBIC Blood Culture adequate volume   Culture   Final    NO GROWTH 5 DAYS Performed at [facility], [place] [address], [place], [place] [address]    Report Status 04/27/2018 FINAL  Final  Culture, blood (routine x 2) Call MD if unable to obtain prior to antibiotics being given     Status: None   Collection Time: 04/21/18 11:24 PM  Result Value Ref Range Status   Specimen Description BLOOD RIGHT ANTECUBITAL  Final   Special Requests   Final    BOTTLES DRAWN AEROBIC AND ANAEROBIC Blood Culture adequate volume   Culture   Final    NO GROWTH 5 DAYS Performed at [facility], [place] [address], [place], [place] [address]    Report Status 04/27/2018 FINAL  Final  Culture, sputum-assessment     Status: None   Collection Time: 04/22/18 10:27 AM  Result Value Ref Range Status   Specimen Description TRACHEAL ASPIRATE  Final   Special Requests Normal  Final   Sputum evaluation   Final    THIS SPECIMEN IS ACCEPTABLE FOR SPUTUM CULTURE Performed at [facility], [place] [address], [place], [place] [address]    Report Status 04/22/2018 FINAL  Final   Culture, respiratory     Status: None   Collection Time: 04/22/18 10:27 AM  Result Value Ref Range Status   Specimen Description TRACHEAL ASPIRATE  Final   Special Requests Normal Reflexed from [id number]  Final   Gram Stain   Final    FEW WBC PRESENT, PREDOMINANTLY PMN MODERATE GRAM NEGATIVE RODS RARE BUDDING YEAST SEEN Performed at [facility], [place] [address], [place], [place] [address]    Culture   Final    ABUNDANT KLEBSIELLA PNEUMONIAE Confirmed Extended Spectrum Beta-Lactamase Producer (ESBL).  In bloodstream infections from ESBL organisms, carbapenems are preferred over piperacillin/tazobactam. They are shown to have a lower risk of mortality.    Report Status 04/24/2018 FINAL  Final   Organism ID, Bacteria KLEBSIELLA PNEUMONIAE  Final      Susceptibility   Klebsiella pneumoniae - MIC*    AMPICILLIN >=32 RESISTANT Resistant     CEFAZOLIN >=64 RESISTANT Resistant     CEFEPIME RESISTANT Resistant  CEFTAZIDIME RESISTANT Resistant     CEFTRIAXONE RESISTANT Resistant     CIPROFLOXACIN 1 SENSITIVE Sensitive     GENTAMICIN 8 INTERMEDIATE Intermediate     IMIPENEM <=0.25 SENSITIVE Sensitive     TRIMETH/SULFA >=320 RESISTANT Resistant     AMPICILLIN/SULBACTAM 8 SENSITIVE Sensitive     PIP/TAZO <=4 SENSITIVE Sensitive     Extended ESBL POSITIVE Resistant     * ABUNDANT KLEBSIELLA PNEUMONIAE  Culture, Urine     Status: Abnormal   Collection Time: 04/23/18 11:10 AM  Result Value Ref Range Status   Specimen Description URINE, CATHETERIZED  Final   Special Requests   Final    NONE Performed at [facility], [place] [address], [place], [place] [address]    Culture (A)  Final    10,000 COLONIES/mL HAFNIA ALVEI 10,000 COLONIES/mL YEAST    Report Status 04/25/2018 FINAL  Final   Organism ID, Bacteria HAFNIA ALVEI (A)  Final      Susceptibility   Hafnia alvei - MIC*    AMPICILLIN >=32 RESISTANT Resistant     CEFAZOLIN >=64 RESISTANT Resistant     CEFTRIAXONE 32  INTERMEDIATE Intermediate     CIPROFLOXACIN <=0.25 SENSITIVE Sensitive     GENTAMICIN <=1 SENSITIVE Sensitive     IMIPENEM <=0.25 SENSITIVE Sensitive     NITROFURANTOIN <=16 SENSITIVE Sensitive     TRIMETH/SULFA <=20 SENSITIVE Sensitive     AMPICILLIN/SULBACTAM >=32 RESISTANT Resistant     PIP/TAZO >=128 RESISTANT Resistant     * 10,000 COLONIES/mL HAFNIA ALVEI    RADIOLOGY STUDIES/RESULTS: Dg Chest 1 View  Result Date: 04/08/2018 CLINICAL DATA:  Hemodialysis catheter insertion. EXAM: CHEST  1 VIEW COMPARISON:  04/08/2018 FINDINGS: There is a right IJ central venous catheter in the mid SVC. The catheter tip is long lateral wall near the brachiocephalic SVC junction. The endotracheal tube is in good position at the mid tracheal level. There is an NG tube coursing down the esophagus and into the stomach. Stable cardiac enlargement and prominent mediastinum. Persistent pulmonary edema and pleural effusions. Slight improved lung aeration when compared to earlier film. IMPRESSION: 1. Left IJ central venous catheter tip in the SVC near the brachiocephalic SVC junction. 2. Right subclavian central venous catheter is stable. 3. Stable ET and NG tubes. 4. Persistent cardiac enlargement, central vascular congestion and pulmonary edema but slight improved aeration since earlier film. 5. Small effusions. Electronically Signed   By: Marijo Sanes M.D.   On: 04/08/2018 13:11   Dg Abd 1 View  Result Date: 04/22/2018 CLINICAL DATA:  OG tube placement. EXAM: ABDOMEN - 1 VIEW COMPARISON:  None. FINDINGS: Tip and side port of the enteric tube below the diaphragm in the stomach. Single prominent bowel loop in the upper abdomen is likely transverse colon. Cholecystectomy clips. IMPRESSION: Tip and side port of the enteric tube below the diaphragm in the stomach. Electronically Signed   By: Keith Rake M.D.   On: 04/22/2018 00:06   US Renal  Result Date: 04/07/2018 CLINICAL DATA:  Acute renal failure. EXAM:  RENAL / URINARY TRACT ULTRASOUND COMPLETE COMPARISON:  Yesterday. FINDINGS: Right Kidney: Renal measurements: 13.2 x 6.8 x 6 0 cm = volume: 281 mL . Echogenicity within normal limits. No mass or hydronephrosis visualized. Left Kidney: Renal measurements: 13.2 x 6 0 x 5.2 cm = volume: 215 mL. Echogenicity within normal limits. No mass or hydronephrosis visualized. Bladder: Not visualized with a Foley catheter in place. IMPRESSION: 1. Normal kidneys without hydronephrosis. 2.  Nonvisualized urinary bladder with a Foley catheter in place. Electronically Signed   By: Claudie Revering M.D.   On: 04/07/2018 19:38   Dg Chest Port 1 View  Result Date: 04/23/2018 CLINICAL DATA:  Intubated patient admitted 04/21/2018 with altered mental status. EXAM: PORTABLE CHEST 1 VIEW COMPARISON:  Single-view of the chest 04/21/2018 and 04/12/2018. FINDINGS: Right IJ catheter tip projects in the right subclavian vein, unchanged. NG tube courses into the stomach and below the inferior margin the film. Endotracheal tube tip projects at the thoracic inlet and should be advanced approximately 3 cm. Cardiomegaly is again seen. Bilateral airspace disease persists but aeration in the right upper lung zone has improved. IMPRESSION: ETT tip projects at the thoracic inlet. The tube should be advanced approximately 3 cm. Bilateral airspace disease persist but aeration in the right upper lobe is markedly improved. Electronically Signed   By: Inge Rise M.D.   On: 04/23/2018 07:51   Dg Chest Port 1 View  Result Date: 04/21/2018 CLINICAL DATA:  Respiratory failure EXAM: PORTABLE CHEST 1 VIEW COMPARISON:  04/21/2018 FINDINGS: Interval placement of esophagogastric tube, tip and side-port appear to be below the diaphragm although the lower portion of the examination is very underpenetrated, limiting visualization. Slight interval improvement in aeration of the right upper lobe. Otherwise unchanged examination with endotracheal tube projecting  below the thoracic inlet, right neck vascular catheter, and cardiomegaly. IMPRESSION: Interval placement of esophagogastric tube, tip and side-port appear to be below the diaphragm although the lower portion of the examination is very underpenetrated, limiting visualization. Slight interval improvement in aeration of the right upper lobe. Otherwise unchanged examination with endotracheal tube projecting below the thoracic inlet, right neck vascular catheter, and cardiomegaly. Electronically Signed   By: Eddie Candle M.D.   On: 04/21/2018 23:01   Dg Chest Port 1 View  Result Date: 04/12/2018 CLINICAL DATA:  Abnormal respirations. EXAM: PORTABLE CHEST 1 VIEW COMPARISON:  04/10/2018. FINDINGS: Interim extubation and removal of NG tube. Left IJ line noted with tip over superior vena cava. Cardiomegaly. Diffuse bilateral pulmonary infiltrates/edema. Improved aeration right upper lung. No pleural effusion or pneumothorax. IMPRESSION: 1. Interim extubation and removal of NG tube. Left IJ line stable position. 2.  Cardiomegaly, no interim change. 3. Diffuse bilateral pulmonary infiltrates/edema noted. Improved aeration right upper lung. Electronically Signed   By: Marcello Moores  Register   On: 04/12/2018 06:19   Dg Chest Port 1 View  Result Date: 04/10/2018 CLINICAL DATA:  Hypoxia EXAM: PORTABLE CHEST 1 VIEW COMPARISON:  April 09, 2018 FINDINGS: Endotracheal tube tip is 7.5 cm above the carina. Central catheter tip is at the junction of the superior vena cava and left innominate vein. Nasogastric tube tip and side port are below the diaphragm. No pneumothorax. : There is patchy atelectasis in the left lower lobe and right upper lobe regions. There is no appreciable edema or consolidation. Heart is mildly enlarged with pulmonary vascularity normal. No adenopathy. No bone lesions. IMPRESSION: Tube and catheter positions as described without pneumothorax. Areas of patchy atelectasis bilaterally. No frank consolidation or  edema. Stable cardiac enlargement. Electronically Signed   By: Lowella Grip III M.D.   On: 04/10/2018 07:16   Dg Chest Port 1 View  Result Date: 04/09/2018 CLINICAL DATA:  Shortness of breath EXAM: PORTABLE CHEST 1 VIEW COMPARISON:  04/08/2018 FINDINGS: Endotracheal tube tip is at the level of the clavicular heads. Left IJ central venous catheter tip is in the mid SVC. Orogastric tube tip is beyond the field  of view. There is moderate cardiomegaly with mild pulmonary edema and small pleural effusions. Right subclavian approach central venous catheter is unchanged. IMPRESSION: Cardiomegaly, small pleural effusions and mild pulmonary edema. Unchanged support apparatus. Electronically Signed   By: Ulyses Jarred M.D.   On: 04/09/2018 06:18   Dg Chest Port 1 View  Result Date: 04/08/2018 CLINICAL DATA:  Respiratory failure EXAM: PORTABLE CHEST 1 VIEW COMPARISON:  04/07/2018 FINDINGS: Support Apparatus: --Endotracheal tube: Tip at the level of the clavicular heads. --Enteric tube:Tip and sideport are below the field of view. --Catheter(s):Right subclavian vein approach central venous catheter tip is at the lower SVC --Other: None Unchanged cardiomegaly and mild pulmonary edema. Failing pleural effusions are also unchanged. IMPRESSION: Unchanged appearance of the chest with findings of congestive heart failure. Unchanged support apparatus. Electronically Signed   By: Ulyses Jarred M.D.   On: 04/08/2018 05:17   Dg Chest Port 1 View  Result Date: 04/07/2018 CLINICAL DATA:  Acute respiratory failure. EXAM: PORTABLE CHEST 1 VIEW COMPARISON:  Earlier today. FINDINGS: Endotracheal tube in satisfactory position. Stable right central venous catheter. Nasogastric tube extending into the stomach. Progressive enlargement of the cardiac silhouette. Diffuse prominence of the pulmonary vasculature and interstitial markings with less bilateral airspace opacity with an improved inspiration. Unremarkable bones. IMPRESSION:  Mildly progressive cardiomegaly with grossly stable changes of congestive heart failure. Electronically Signed   By: Claudie Revering M.D.   On: 04/07/2018 19:42     LOS: 7 days   Oren Binet, MD  [facility]  If 7PM-7AM, please contact night-coverage  Please page via [email or web address]  Go to [email or web address] and use [place]'s universal password to access. If you do not have the password, please contact the hospital operator.  Locate the [facility] provider you are looking for under [facility] and page to a number that you can be directly reached. If you still have difficulty reaching the provider, please page the [facility] (Director on Call) for the Hospitalists listed on amion for assistance.  04/28/2018, 10:04 AM

## 2018-04-28 NOTE — Progress Notes (Signed)
Pt refusing to turn over for skin assessment, Pt states " I dont want to wake up fully. Im sleeping". Will continue to monitor pt

## 2018-04-28 NOTE — Progress Notes (Signed)
PHARMACY NOTE -  ANTIBIOTIC RENAL DOSE ADJUSTMENT per Pharmacy and Therapeutic Committee policy    .  Patient was been initiated on Merrem 52m  IV q24h  for HCAP-ESBL Klebsiella.  Initial dosing for AKI, SCr 4.51  AKI on CKD IV, Baseline Scr ~1.9.  Admit SCr  6.9> has  trended down/improved today to 2.21. Estimated CrCl 58  Ml/min.  Merrem dose should be adjusted for improved renal function.  Plan:  Merrem dose increased to 1 gm IV q8h   Eddie Davies A. PLevada Dy PharmD, BTurkeyPlease utilize Amion for appropriate phone number to reach the unit pharmacist ([email or web address]   10:34 AM

## 2018-04-29 LAB — GLUCOSE, CAPILLARY
Glucose-Capillary: 113 mg/dL — ABNORMAL HIGH (ref 70–99)
Glucose-Capillary: 214 mg/dL — ABNORMAL HIGH (ref 70–99)
Glucose-Capillary: 208 mg/dL — ABNORMAL HIGH (ref 70–99)
Glucose-Capillary: 229 mg/dL — ABNORMAL HIGH (ref 70–99)

## 2018-04-29 LAB — BASIC METABOLIC PANEL
Anion gap: 9 (ref 5–15)
BUN: 41 mg/dL — ABNORMAL HIGH (ref 6–20)
CO2: 17 mmol/L — ABNORMAL LOW (ref 22–32)
Calcium: 7.7 mg/dL — ABNORMAL LOW (ref 8.9–10.3)
Chloride: 116 mmol/L — ABNORMAL HIGH (ref 98–111)
Creatinine, Ser: 2.02 mg/dL — ABNORMAL HIGH (ref 0.61–1.24)
GFR calc Af Amer: 44 mL/min — ABNORMAL LOW (ref >60)
GFR calc non Af Amer: 38 mL/min — ABNORMAL LOW (ref >60)
Glucose, Bld: 107 mg/dL — ABNORMAL HIGH (ref 70–99)
Potassium: 3.7 mmol/L (ref 3.5–5.1)
Sodium: 142 mmol/L (ref 135–145)

## 2018-04-29 MED ORDER — SODIUM CHLORIDE 0.9 % IV SOLN
INTRAVENOUS | Status: DC | PRN
  Administered 2018-04-29: 250 mL via INTRAVENOUS

## 2018-04-29 NOTE — Progress Notes (Signed)
PROGRESS NOTE        PATIENT DETAILS Name: Eddie Davies Age: 48 y.o. Sex: male Date of Birth: 1970/08/16 Admit Date: 04/21/2018 Admitting Physician Collene Gobble, MD [id number], [facility]  Brief Narrative: Patient is a 48 y.o. male with history of with history of CKD stage 3, presumed liver cirrhosis, hypertension, seizures-transferred from [facility] for altered mental status, acute hypoxic respiratory failure in the setting of ESBL Klebsiella pneumoniae HCAP.  Per H&P, patient was refusing medications for ESLD, and had recently refused HD.  Patient was managed in the intensive care unit, extubated on 3/30-and transferred to the triad hospitalist service.  See below for further details.  Subjective: Sleeping when I walked in-but awakes easily-denies any chest pain or shortness of breath.  Assessment/Plan: Acute metabolic encephalopathy: Resolved-he is completely awake and alert.  Suspect encephalopathy was multifactorial-secondary to hypoxia, pneumonia, worsening renal function.  Acute hypoxic respiratory failure: Intubated for airway protection-some amount of hypoxia is likely secondary to right upper lobe infiltrate.  Sputum culture positive for ESBL Klebsiella pneumonia-continue meropenem.  He was extubated on 3/30-currently on room air.  Lungs are clear to auscultation.    ESBL Klebsiella pneumonia: Afebrile-lungs are clear-on room air-sputum culture positive for.  Continue meropenem for total of 7 days-last dose on 4/6.  Per SW-SNF not taking patient back on third of COVID testing is done-very low risk at this point-and improving with current care.  I have reached out to hospitalist director at this point-we will await further guidance-but at this point do not think there is no indication for any further testing.  AKI on CKD stage III: Likely hemodynamically mediated in the setting of hypotension, UTI-renal function improving-renal  function has improved with supportive care-creatinine now close to usual baseline.   Uncontrolled DM-2 with hyperglycemia: CBGs much improved-continue Lantus 15 units twice daily and SSI.   Anemia: Likely secondary to acute illness on top of anemia of CKD.  No evidence of bleeding-follow hemoglobin periodically.  Has been transfused 1 unit of PRBC on 3/30.  Follow periodically  Hypokalemia: Repleted.  Recheck periodically.  Presumed liver cirrhosis: Appears to be well compensated at present-he is awake and alert with no evidence of encephalopathy-remains on rifaximin and lactulose.  Seizure disorder: Stable-continue phenobarbital and Tegretol.  Hypertension: BP controlled-continue monitoring without the use of any antihypertensives  BPH: Continue Flomax  Cognitive delay/deconditioning: Mostly wheelchair bound-has some amount of cognitive delay-resident of SNF   DVT Prophylaxis:  Heparin   Code Status: Full code  Family Communication: None at bedside  Disposition Plan: Remain inpatient-SNF on discharge over the next few days  Antimicrobial agents: Anti-infectives (From admission, onward)   Start     Dose/Rate Route Frequency Ordered Stop   04/28/18 1400  meropenem (MERREM) 1 g in sodium chloride 0.9 % 100 mL IVPB     1 g 200 mL/hr over 30 Minutes Intravenous Every 8 hours 04/28/18 1036     04/25/18 1800  meropenem (MERREM) 1 g in sodium chloride 0.9 % 100 mL IVPB  Status:  Discontinued     1 g 200 mL/hr over 30 Minutes Intravenous Every 12 hours 04/25/18 1641 04/28/18 1036   04/24/18 2200  rifaximin (XIFAXAN) tablet 550 mg     550 mg Oral 2 times daily 04/24/18 1810     04/24/18 1200  meropenem (MERREM) 500  mg in sodium chloride 0.9 % 100 mL IVPB  Status:  Discontinued     500 mg 200 mL/hr over 30 Minutes Intravenous Every 24 hours 04/24/18 1044 04/25/18 1641   04/21/18 2359  ceFEPIme (MAXIPIME) 1 g in sodium chloride 0.9 % 100 mL IVPB  Status:  Discontinued     1 g 200  mL/hr over 30 Minutes Intravenous Daily at bedtime 04/21/18 2247 04/24/18 1044   04/21/18 2359  vancomycin (VANCOCIN) 2,000 mg in sodium chloride 0.9 % 500 mL IVPB     2,000 mg 250 mL/hr over 120 Minutes Intravenous  Once 04/21/18 2316 04/22/18 0305   04/21/18 2319  vancomycin variable dose per unstable renal function (pharmacist dosing)  Status:  Discontinued      Does not apply See admin instructions 04/21/18 2319 04/24/18 1044   04/21/18 2230  rifaximin (XIFAXAN) tablet 550 mg  Status:  Discontinued     550 mg Per Tube 2 times daily 04/21/18 2223 04/24/18 1810      Procedures: None  CONSULTS: PCCM Renal  Time spent: 25 minutes-Greater than 50% of this time was spent in counseling, explanation of diagnosis, planning of further management, and coordination of care.  MEDICATIONS: Scheduled Meds:  calcium acetate  1,334 mg Oral TID WC   carbamazepine  600 mg Oral BID   chlorhexidine gluconate (MEDLINE KIT)  15 mL Mouth Rinse BID   feeding supplement (NEPRO CARB STEADY)  237 mL Oral BID BM   heparin injection (subcutaneous)  5,000 Units Subcutaneous Q8H   hydrocerin   Topical BID   insulin aspart  0-15 Units Subcutaneous TID WC   insulin glargine  15 Units Subcutaneous BID   lactulose  30 g Oral TID   mouth rinse  15 mL Mouth Rinse QID   multivitamin  1 tablet Oral QHS   phenobarbital  97.2 mg Oral QHS   potassium chloride  40 mEq Oral BID   rifaximin  550 mg Oral BID   senna-docusate  1 tablet Oral BID   sodium bicarbonate  1,300 mg Oral TID   sodium chloride flush  10-40 mL Intracatheter Q12H   tamsulosin  0.4 mg Oral QHS   Continuous Infusions:  sodium chloride 250 mL (04/29/18 0551)   meropenem (MERREM) IV 1 g (04/29/18 0552)   PRN Meds:.sodium chloride, sodium chloride flush   PHYSICAL EXAM: Vital signs: Vitals:   04/28/18 0639 04/28/18 1351 04/28/18 2145 04/29/18 0547  BP: 135/72 (!) 148/77 126/69 134/64  Pulse: 71 71 64 65  Resp: _0 Temp: 97.6 F (36.4 C) 97.8 F (36.6 C) 98.2 F (36.8 C) 98 F (36.7 C)  TempSrc: Oral  Oral Oral  SpO2: 99% 99% 98% 100%  Weight:      Height:       Filed Weights   04/22/18 0000 04/23/18 0000 04/28/18 0535  Weight: 130.6 kg (!) 136.6 kg 131.7 kg   Body mass index is 39.38 kg/m.   General appearance:Awake, alert, not in any distress.  Eyes:no scleral icterus. HEENT: Atraumatic and Normocephalic Neck: supple, no JVD. Resp:Good air entry bilaterally,no rales or rhonchi CVS: S1 S2 regular, no murmurs.  GI: Bowel sounds present, Non tender and not distended with no gaurding, rigidity or rebound. Extremities: B/L Lower Ext shows no edema, both legs are warm to touch Neurology: Moves all 4 extremities Psychiatric: Normal judgment and insight. Normal mood. Musculoskeletal:No digital cyanosis Wounds:N/A  I have personally reviewed following labs and imaging studies  LABORATORY DATA: CBC: Recent Labs  Lab 04/23/18 0435 04/23/18 1724 04/24/18 0258 04/25/18 0631 04/27/18 0925 04/27/18 1048  WBC 4.6  --  4.4 4.0 3.0* 2.8*  HGB 6.0* 7.7* 7.6* 7.8* 8.2* 8.2*  HCT 20.4* 26.4* 26.1* 25.5* 26.4* 25.7*  MCV 99.5  --  99.6 98.1 93.6 93.1  PLT 140*  --  144* 160 124* 121*    Basic Metabolic Panel: Recent Labs  Lab 04/23/18 0435  04/24/18 0258 04/25/18 0631 04/25/18 1615 04/26/18 0316 04/27/18 0925 04/28/18 0315 04/29/18 0516  NA 148*   < > 149* 139 139 136 143 143 142  K 2.7*   < > 3.3* 3.4* 3.0* 2.8* 3.4* 3.3* 3.7  CL 120*   < > 117* 110 112* 110 118* 117* 116*  CO2 18*   < > 21* 13* 16* 18* 14* 17* 17*  GLUCOSE 143*   < > 158* 468* 432* 397* 165* 168* 107*  BUN 50*   < > 48* 45* 46* 45* 46* 45* 41*  CREATININE 5.70*   < > 4.51* 3.86* 3.48* 3.26* 2.65* 2.21* 2.02*  CALCIUM 7.3*   < > 7.8* 7.5* 7.6* 7.3* 7.6* 7.4* 7.7*  MG 2.7*  --  2.6*  --   --   --  2.0 1.9  --   PHOS 10.9*  --  10.7* 10.2*  --  8.3* 7.3*  --   --    < > = values in this interval not  displayed.    GFR: Estimated Creatinine Clearance: 63.4 mL/min (A) (by C-G formula based on SCr of 2.02 mg/dL (H)).  Liver Function Tests: Recent Labs  Lab 04/23/18 0435 04/24/18 0258 04/25/18 0631 04/26/18 0316 04/27/18 0925  AST 11*  --  11*  --   --   ALT 7  --  9  --   --   ALKPHOS 109  --  118  --   --   BILITOT 0.9  --  1.2  --   --   PROT 5.0*  --  5.6*  --   --   ALBUMIN 2.0* 2.1* 2.2* 2.1* 2.4*   No results for input(s): LIPASE, AMYLASE in the last 168 hours. Recent Labs  Lab 04/22/18 1802 04/24/18 0258  AMMONIA 37* 60*    Coagulation Profile: No results for input(s): INR, PROTIME in the last 168 hours.  Cardiac Enzymes: Recent Labs  Lab 04/24/18 0258  CKTOTAL 26*    BNP (last 3 results) No results for input(s): PROBNP in the last 8760 hours.  HbA1C: No results for input(s): HGBA1C in the last 72 hours.  CBG: Recent Labs  Lab 04/28/18 0803 04/28/18 1207 04/28/18 1701 04/28/18 2142 04/29/18 0807  GLUCAP 91 116* 132* 120* 113*    Lipid Profile: No results for input(s): CHOL, HDL, LDLCALC, TRIG, CHOLHDL, LDLDIRECT in the last 72 hours.  Thyroid Function Tests: No results for input(s): TSH, T4TOTAL, FREET4, T3FREE, THYROIDAB in the last 72 hours.  Anemia Panel: No results for input(s): VITAMINB12, FOLATE, FERRITIN, TIBC, IRON, RETICCTPCT in the last 72 hours.  Urine analysis:    Component Value Date/Time   COLORURINE AMBER (A) 04/08/2018 1306   APPEARANCEUR CLOUDY (A) 04/08/2018 1306   LABSPEC 1.018 04/08/2018 1306   PHURINE 5.0 04/08/2018 1306   GLUCOSEU NEGATIVE 04/08/2018 1306   HGBUR MODERATE (A) 04/08/2018 1306   BILIRUBINUR NEGATIVE 04/08/2018 1306   KETONESUR NEGATIVE 04/08/2018 1306   PROTEINUR 100 (A) 04/08/2018 1306   NITRITE NEGATIVE 04/08/2018 1306  LEUKOCYTESUR LARGE (A) 04/08/2018 1306    Sepsis Labs: Lactic Acid, Venous    Component Value Date/Time   LATICACIDVEN 0.9 04/22/2018 0419    MICROBIOLOGY: Recent  Results (from the past 240 hour(s))  MRSA PCR Screening     Status: None   Collection Time: 04/21/18  9:33 PM  Result Value Ref Range Status   MRSA by PCR NEGATIVE NEGATIVE Final    Comment:        The GeneXpert MRSA Assay (FDA approved for NASAL specimens only), is one component of a comprehensive MRSA colonization surveillance program. It is not intended to diagnose MRSA infection nor to guide or monitor treatment for MRSA infections. Performed at [facility], [place] [address] [place], [place] [address]   Culture, blood (routine x 2) Call MD if unable to obtain prior to antibiotics being given     Status: None   Collection Time: 04/21/18 11:24 PM  Result Value Ref Range Status   Specimen Description BLOOD RIGHT ANTECUBITAL  Final   Special Requests   Final    BOTTLES DRAWN AEROBIC AND ANAEROBIC Blood Culture adequate volume   Culture   Final    NO GROWTH 5 DAYS Performed at [facility], [place] [address], [place], [place] [address]    Report Status 04/27/2018 FINAL  Final  Culture, blood (routine x 2) Call MD if unable to obtain prior to antibiotics being given     Status: None   Collection Time: 04/21/18 11:24 PM  Result Value Ref Range Status   Specimen Description BLOOD RIGHT ANTECUBITAL  Final   Special Requests   Final    BOTTLES DRAWN AEROBIC AND ANAEROBIC Blood Culture adequate volume   Culture   Final    NO GROWTH 5 DAYS Performed at [facility], [place] [address], [place], [place] [address]    Report Status 04/27/2018 FINAL  Final  Culture, sputum-assessment     Status: None   Collection Time: 04/22/18 10:27 AM  Result Value Ref Range Status   Specimen Description TRACHEAL ASPIRATE  Final   Special Requests Normal  Final   Sputum evaluation   Final    THIS SPECIMEN IS ACCEPTABLE FOR SPUTUM CULTURE Performed at [facility], [place] [address], [place], [name] [address]    Report Status 04/22/2018 FINAL  Final  Culture, respiratory      Status: None   Collection Time: 04/22/18 10:27 AM  Result Value Ref Range Status   Specimen Description TRACHEAL ASPIRATE  Final   Special Requests Normal Reflexed from [id number]  Final   Gram Stain   Final    FEW WBC PRESENT, PREDOMINANTLY PMN MODERATE GRAM NEGATIVE RODS RARE BUDDING YEAST SEEN Performed at [facility], [place] [address], [place], [place] [address]    Culture   Final    ABUNDANT KLEBSIELLA PNEUMONIAE Confirmed Extended Spectrum Beta-Lactamase Producer (ESBL).  In bloodstream infections from ESBL organisms, carbapenems are preferred over piperacillin/tazobactam. They are shown to have a lower risk of mortality.    Report Status 04/24/2018 FINAL  Final   Organism ID, Bacteria KLEBSIELLA PNEUMONIAE  Final      Susceptibility   Klebsiella pneumoniae - MIC*    AMPICILLIN >=32 RESISTANT Resistant     CEFAZOLIN >=64 RESISTANT Resistant     CEFEPIME RESISTANT Resistant     CEFTAZIDIME RESISTANT Resistant     CEFTRIAXONE RESISTANT Resistant     CIPROFLOXACIN 1 SENSITIVE Sensitive  GENTAMICIN 8 INTERMEDIATE Intermediate     IMIPENEM <=0.25 SENSITIVE Sensitive     TRIMETH/SULFA >=320 RESISTANT Resistant     AMPICILLIN/SULBACTAM 8 SENSITIVE Sensitive     PIP/TAZO <=4 SENSITIVE Sensitive     Extended ESBL POSITIVE Resistant     * ABUNDANT KLEBSIELLA PNEUMONIAE  Culture, Urine     Status: Abnormal   Collection Time: 04/23/18 11:10 AM  Result Value Ref Range Status   Specimen Description URINE, CATHETERIZED  Final   Special Requests   Final    NONE Performed at [facility], [place] [address], [place], [place] [address]    Culture (A)  Final    10,000 COLONIES/mL HAFNIA ALVEI 10,000 COLONIES/mL YEAST    Report Status 04/25/2018 FINAL  Final   Organism ID, Bacteria HAFNIA ALVEI (A)  Final      Susceptibility   Hafnia alvei - MIC*    AMPICILLIN >=32 RESISTANT Resistant     CEFAZOLIN >=64 RESISTANT Resistant     CEFTRIAXONE 32 INTERMEDIATE Intermediate      CIPROFLOXACIN <=0.25 SENSITIVE Sensitive     GENTAMICIN <=1 SENSITIVE Sensitive     IMIPENEM <=0.25 SENSITIVE Sensitive     NITROFURANTOIN <=16 SENSITIVE Sensitive     TRIMETH/SULFA <=20 SENSITIVE Sensitive     AMPICILLIN/SULBACTAM >=32 RESISTANT Resistant     PIP/TAZO >=128 RESISTANT Resistant     * 10,000 COLONIES/mL HAFNIA ALVEI    RADIOLOGY STUDIES/RESULTS: Dg Chest 1 View  Result Date: 04/08/2018 CLINICAL DATA:  Hemodialysis catheter insertion. EXAM: CHEST  1 VIEW COMPARISON:  04/08/2018 FINDINGS: There is a right IJ central venous catheter in the mid SVC. The catheter tip is long lateral wall near the brachiocephalic SVC junction. The endotracheal tube is in good position at the mid tracheal level. There is an NG tube coursing down the esophagus and into the stomach. Stable cardiac enlargement and prominent mediastinum. Persistent pulmonary edema and pleural effusions. Slight improved lung aeration when compared to earlier film. IMPRESSION: 1. Left IJ central venous catheter tip in the SVC near the brachiocephalic SVC junction. 2. Right subclavian central venous catheter is stable. 3. Stable ET and NG tubes. 4. Persistent cardiac enlargement, central vascular congestion and pulmonary edema but slight improved aeration since earlier film. 5. Small effusions. Electronically Signed   By: Marijo Sanes M.D.   On: 04/08/2018 13:11   Dg Abd 1 View  Result Date: 04/22/2018 CLINICAL DATA:  OG tube placement. EXAM: ABDOMEN - 1 VIEW COMPARISON:  None. FINDINGS: Tip and side port of the enteric tube below the diaphragm in the stomach. Single prominent bowel loop in the upper abdomen is likely transverse colon. Cholecystectomy clips. IMPRESSION: Tip and side port of the enteric tube below the diaphragm in the stomach. Electronically Signed   By: Keith Rake M.D.   On: 04/22/2018 00:06   US Renal  Result Date: 04/07/2018 CLINICAL DATA:  Acute renal failure. EXAM: RENAL / URINARY TRACT ULTRASOUND  COMPLETE COMPARISON:  Yesterday. FINDINGS: Right Kidney: Renal measurements: 13.2 x 6.8 x 6 0 cm = volume: 281 mL . Echogenicity within normal limits. No mass or hydronephrosis visualized. Left Kidney: Renal measurements: 13.2 x 6 0 x 5.2 cm = volume: 215 mL. Echogenicity within normal limits. No mass or hydronephrosis visualized. Bladder: Not visualized with a Foley catheter in place. IMPRESSION: 1. Normal kidneys without hydronephrosis. 2. Nonvisualized urinary bladder with a Foley catheter in place. Electronically Signed   By: Claudie Revering M.D.   On: 04/07/2018  19:38   Dg Chest Port 1 View  Result Date: 04/23/2018 CLINICAL DATA:  Intubated patient admitted 04/21/2018 with altered mental status. EXAM: PORTABLE CHEST 1 VIEW COMPARISON:  Single-view of the chest 04/21/2018 and 04/12/2018. FINDINGS: Right IJ catheter tip projects in the right subclavian vein, unchanged. NG tube courses into the stomach and below the inferior margin the film. Endotracheal tube tip projects at the thoracic inlet and should be advanced approximately 3 cm. Cardiomegaly is again seen. Bilateral airspace disease persists but aeration in the right upper lung zone has improved. IMPRESSION: ETT tip projects at the thoracic inlet. The tube should be advanced approximately 3 cm. Bilateral airspace disease persist but aeration in the right upper lobe is markedly improved. Electronically Signed   By: Inge Rise M.D.   On: 04/23/2018 07:51   Dg Chest Port 1 View  Result Date: 04/21/2018 CLINICAL DATA:  Respiratory failure EXAM: PORTABLE CHEST 1 VIEW COMPARISON:  04/21/2018 FINDINGS: Interval placement of esophagogastric tube, tip and side-port appear to be below the diaphragm although the lower portion of the examination is very underpenetrated, limiting visualization. Slight interval improvement in aeration of the right upper lobe. Otherwise unchanged examination with endotracheal tube projecting below the thoracic inlet, right neck  vascular catheter, and cardiomegaly. IMPRESSION: Interval placement of esophagogastric tube, tip and side-port appear to be below the diaphragm although the lower portion of the examination is very underpenetrated, limiting visualization. Slight interval improvement in aeration of the right upper lobe. Otherwise unchanged examination with endotracheal tube projecting below the thoracic inlet, right neck vascular catheter, and cardiomegaly. Electronically Signed   By: Eddie Candle M.D.   On: 04/21/2018 23:01   Dg Chest Port 1 View  Result Date: 04/12/2018 CLINICAL DATA:  Abnormal respirations. EXAM: PORTABLE CHEST 1 VIEW COMPARISON:  04/10/2018. FINDINGS: Interim extubation and removal of NG tube. Left IJ line noted with tip over superior vena cava. Cardiomegaly. Diffuse bilateral pulmonary infiltrates/edema. Improved aeration right upper lung. No pleural effusion or pneumothorax. IMPRESSION: 1. Interim extubation and removal of NG tube. Left IJ line stable position. 2.  Cardiomegaly, no interim change. 3. Diffuse bilateral pulmonary infiltrates/edema noted. Improved aeration right upper lung. Electronically Signed   By: Marcello Moores  Register   On: 04/12/2018 06:19   Dg Chest Port 1 View  Result Date: 04/10/2018 CLINICAL DATA:  Hypoxia EXAM: PORTABLE CHEST 1 VIEW COMPARISON:  April 09, 2018 FINDINGS: Endotracheal tube tip is 7.5 cm above the carina. Central catheter tip is at the junction of the superior vena cava and left innominate vein. Nasogastric tube tip and side port are below the diaphragm. No pneumothorax. : There is patchy atelectasis in the left lower lobe and right upper lobe regions. There is no appreciable edema or consolidation. Heart is mildly enlarged with pulmonary vascularity normal. No adenopathy. No bone lesions. IMPRESSION: Tube and catheter positions as described without pneumothorax. Areas of patchy atelectasis bilaterally. No frank consolidation or edema. Stable cardiac enlargement.  Electronically Signed   By: Lowella Grip III M.D.   On: 04/10/2018 07:16   Dg Chest Port 1 View  Result Date: 04/09/2018 CLINICAL DATA:  Shortness of breath EXAM: PORTABLE CHEST 1 VIEW COMPARISON:  04/08/2018 FINDINGS: Endotracheal tube tip is at the level of the clavicular heads. Left IJ central venous catheter tip is in the mid SVC. Orogastric tube tip is beyond the field of view. There is moderate cardiomegaly with mild pulmonary edema and small pleural effusions. Right subclavian approach central venous catheter is unchanged.  IMPRESSION: Cardiomegaly, small pleural effusions and mild pulmonary edema. Unchanged support apparatus. Electronically Signed   By: Ulyses Jarred M.D.   On: 04/09/2018 06:18   Dg Chest Port 1 View  Result Date: 04/08/2018 CLINICAL DATA:  Respiratory failure EXAM: PORTABLE CHEST 1 VIEW COMPARISON:  04/07/2018 FINDINGS: Support Apparatus: --Endotracheal tube: Tip at the level of the clavicular heads. --Enteric tube:Tip and sideport are below the field of view. --Catheter(s):Right subclavian vein approach central venous catheter tip is at the lower SVC --Other: None Unchanged cardiomegaly and mild pulmonary edema. Failing pleural effusions are also unchanged. IMPRESSION: Unchanged appearance of the chest with findings of congestive heart failure. Unchanged support apparatus. Electronically Signed   By: Ulyses Jarred M.D.   On: 04/08/2018 05:17   Dg Chest Port 1 View  Result Date: 04/07/2018 CLINICAL DATA:  Acute respiratory failure. EXAM: PORTABLE CHEST 1 VIEW COMPARISON:  Earlier today. FINDINGS: Endotracheal tube in satisfactory position. Stable right central venous catheter. Nasogastric tube extending into the stomach. Progressive enlargement of the cardiac silhouette. Diffuse prominence of the pulmonary vasculature and interstitial markings with less bilateral airspace opacity with an improved inspiration. Unremarkable bones. IMPRESSION: Mildly progressive cardiomegaly  with grossly stable changes of congestive heart failure. Electronically Signed   By: Claudie Revering M.D.   On: 04/07/2018 19:42     LOS: 8 days   Oren Binet, MD  [facility]  If 7PM-7AM, please contact night-coverage  Please page via [email or web address]  Go to [email or web address] and use [facility]'s universal password to access. If you do not have the password, please contact the hospital operator.  Locate the [facility] provider you are looking for under [facility] and page to a number that you can be directly reached. If you still have difficulty reaching the provider, please page the [facility] (Director on Call) for the Hospitalists listed on amion for assistance.  04/29/2018, 9:09 AM

## 2018-04-29 NOTE — Progress Notes (Signed)
Pt refusing oxygen.

## 2018-04-30 LAB — BASIC METABOLIC PANEL
Anion gap: 7 (ref 5–15)
BUN: 39 mg/dL — ABNORMAL HIGH (ref 6–20)
CO2: 17 mmol/L — ABNORMAL LOW (ref 22–32)
Calcium: 7.8 mg/dL — ABNORMAL LOW (ref 8.9–10.3)
Chloride: 116 mmol/L — ABNORMAL HIGH (ref 98–111)
Creatinine, Ser: 1.87 mg/dL — ABNORMAL HIGH (ref 0.61–1.24)
GFR calc Af Amer: 49 mL/min — ABNORMAL LOW (ref >60)
GFR calc non Af Amer: 42 mL/min — ABNORMAL LOW (ref >60)
Glucose, Bld: 187 mg/dL — ABNORMAL HIGH (ref 70–99)
Potassium: 3.9 mmol/L (ref 3.5–5.1)
Sodium: 140 mmol/L (ref 135–145)

## 2018-04-30 LAB — GLUCOSE, CAPILLARY
Glucose-Capillary: 150 mg/dL — ABNORMAL HIGH (ref 70–99)
Glucose-Capillary: 136 mg/dL — ABNORMAL HIGH (ref 70–99)
Glucose-Capillary: 217 mg/dL — ABNORMAL HIGH (ref 70–99)
Glucose-Capillary: 269 mg/dL — ABNORMAL HIGH (ref 70–99)

## 2018-04-30 MED ORDER — HYDROCERIN EX CREA: 1.0000 "application " | TOPICAL_CREAM | Freq: Two times a day (BID) | CUTANEOUS | 0 refills | Status: DC

## 2018-04-30 MED ORDER — NEPRO/CARBSTEADY PO LIQD: 237.0000 mL | Freq: Two times a day (BID) | ORAL | 0 refills | Status: DC

## 2018-04-30 MED ORDER — SODIUM BICARBONATE 650 MG PO TABS: 1300.0000 mg | ORAL_TABLET | Freq: Three times a day (TID) | ORAL | Status: DC

## 2018-04-30 MED ORDER — INSULIN ASPART 100 UNIT/ML ~~LOC~~ SOLN: SUBCUTANEOUS | 11 refills | Status: DC

## 2018-04-30 MED ORDER — RENA-VITE PO TABS: 1.0000 | ORAL_TABLET | Freq: Every day | ORAL | 0 refills | Status: DC

## 2018-04-30 MED ORDER — LACTULOSE ENCEPHALOPATHY 10 GM/15ML PO SOLN: 30.0000 g | Freq: Two times a day (BID) | ORAL | Status: DC

## 2018-04-30 NOTE — Progress Notes (Signed)
Still awaiting on disposition from Dr. Rockne Menghini to see if she was able to speak with the director at [facility].   CSW faxed patient out to [facility] as a back up. Awaiting confirmation and bed offer.   CSW will continue to follow.   Domenic Schwab, MSW, [place]

## 2018-04-30 NOTE — Progress Notes (Signed)
PT Cancellation Note  Patient Details Name: Eddie Davies MRN: [id number] DOB: [birth year]   Cancelled Treatment:    Reason Eval/Treat Not Completed: Patient declined, no reason specified Pt reports he is supposed to be d.ced back to his facility today and declines PT services. "I just want to get out of here." Will follow if still in the hospital.   Marguarite Arbour A Eleonore Shippee 04/30/2018, 4:26 PM  Wray Kearns, PT, DPT Acute Rehabilitation Services Pager [phone] Office [phone]

## 2018-04-30 NOTE — Discharge Summary (Addendum)
PATIENT DETAILS Name: Eddie Davies Age: 48 y.o. Sex: male Date of Birth: Nov 21, 1970 MRN: [id number]. Admitting Physician: Collene Gobble, MD GYB:[id number], [facility]  Admit Date: 04/21/2018 Discharge date: 05/05/2018  Recommendations for Outpatient Follow-up:  1. Follow up with PCP in 1-2 weeks 2. Please obtain BMP/CBC in one week 3. Please ensure follow-up with nephrology, GI/hepatology  Admitted From:  SNF  Disposition: SNF   Home Health: No  Equipment/Devices: None  Discharge Condition: Stable  CODE STATUS: FULL CODE  Diet recommendation:  Heart Healthy / Carb Modified   Brief Summary: See H&P, Labs, Consult and Test reports for all details in brief,Patient is a 48 y.o. male with history of with history of CKD stage 3, presumed liver cirrhosis, hypertension, seizures-transferred from [facility] for altered mental status, acute hypoxic respiratory failure in the setting of ESBL Klebsiella pneumoniae HCAP.  Per H&P, patient was refusing medications for ESLD, and had recently refused HD.  Patient was managed in the intensive care unit, extubated on 3/30-and transferred to the triad hospitalist service.  See below for further details.  Brief Hospital Course: Acute metabolic encephalopathy: Resolved-he is completely awake and alert.  Suspect encephalopathy was multifactorial-secondary to hypoxia, pneumonia, worsening renal function.  Acute hypoxic respiratory failure: Intubated for airway protection-some amount of hypoxia is likely secondary to right upper lobe infiltrate.  Sputum culture positive for ESBL Klebsiella pneumonia-he was treated with meropenem and has completed his course on 05/01/2018.  He was extubated on 3/30-currently on room air.  Lungs are clear to auscultation.    ESBL Klebsiella pneumonia: Afebrile-lungs are clear-on room air-sputum culture positive for.  Continue meropenem for total of 7 days-last dose on 4/6.  No clinical  suspicion for COVID and he was ruled out for an active infection as well, furthermore-patient is afebrile-and has been titrated to room air for the past few days-he is suspected of having ESBL Klebsiella pneumoniae pneumonia given positive sputum culture.   AKI on CKD stage III: Likely hemodynamically mediated in the setting of hypotension, UTI-renal function improving-renal function has improved with supportive care-creatinine now close to usual baseline.   Repeat BMP in a week at SNF.  Uncontrolled DM-2 with hyperglycemia: CBGs much improved-resume Basaglar insulin 17 units twice daily on discharge and SSI.  Check CBGs QA CHS and adjust dose as needed at SNF.  Anemia: Likely secondary to acute illness on top of anemia of CKD.  No evidence of bleeding-follow hemoglobin periodically.  Has been transfused 1 unit of PRBC on 3/30.  Follow periodically while at SNF  Presumed liver cirrhosis: Appears to be well compensated at present-he is awake and alert with no evidence of encephalopathy-remains on rifaximin and lactulose.  Seizure disorder: Stable-continue phenobarbital and Tegretol.  Hypertension: BP slowly creeping up-resuming amlodipine-resume hydralazine over the next few days.   BPH: Continue Flomax  Cognitive delay/deconditioning: Mostly wheelchair bound-has some amount of cognitive delay-resident of SNF    Procedures/Studies: None  Discharge Diagnoses:  Active Problems:   Pressure injury of skin   Altered mental status, unspecified   HCAP (healthcare-associated pneumonia)   Respiratory failure (HCC)   Acute renal failure superimposed on stage 4 chronic kidney disease ([place])   End stage liver disease ([place])   Discharge Instructions: Check CBGs before meals and at bedtime  Activity:  As tolerated with Full fall precautions use walker/cane & assistance as needed  Discharge Instructions    Diet - low sodium heart healthy   Complete by:  As directed  Diet Carb  Modified   Complete by:  As directed    Discharge instructions   Complete by:  As directed    Follow with Primary MD  Practice, [place] in 1 week  Check CBGs before meals and at bedtime  Please get a complete blood count and chemistry panel checked by your Primary MD at your next visit, and again as instructed by your Primary MD.  Get Medicines reviewed and adjusted: Please take all your medications with you for your next visit with your Primary MD  Laboratory/radiological data: Please request your Primary MD to go over all hospital tests and procedure/radiological results at the follow up, please ask your Primary MD to get all Hospital records sent to his/her office.  In some cases, they will be blood work, cultures and biopsy results pending at the time of your discharge. Please request that your primary care M.D. follows up on these results.  Also Note the following: If you experience worsening of your admission symptoms, develop shortness of breath, life threatening emergency, suicidal or homicidal thoughts you must seek medical attention immediately by calling 911 or calling your MD immediately  if symptoms less severe.  You must read complete instructions/literature along with all the possible adverse reactions/side effects for all the Medicines you take and that have been prescribed to you. Take any new Medicines after you have completely understood and accpet all the possible adverse reactions/side effects.   Do not drive when taking Pain medications or sleeping medications (Benzodaizepines)  Do not take more than prescribed Pain, Sleep and Anxiety Medications. It is not advisable to combine anxiety,sleep and pain medications without talking with your primary care practitioner  Special Instructions: If you have smoked or chewed Tobacco  in the last 2 yrs please stop smoking, stop any regular Alcohol  and or any Recreational drug use.  Wear Seat belts while  driving.  Please note: You were cared for by a hospitalist during your hospital stay. Once you are discharged, your primary care physician will handle any further medical issues. Please note that NO REFILLS for any discharge medications will be authorized once you are discharged, as it is imperative that you return to your primary care physician (or establish a relationship with a primary care physician if you do not have one) for your post hospital discharge needs so that they can reassess your need for medications and monitor your lab values.   Increase activity slowly   Complete by:  As directed      Allergies as of 05/05/2018      Reactions   Fish Oil Swelling, Other (See Comments)         Medication List    STOP taking these medications   HumaLOG KwikPen 100 UNIT/ML KwikPen Generic drug:  insulin lispro   hydrALAZINE 50 MG tablet Commonly known as:  APRESOLINE     TAKE these medications   acetaminophen 325 MG tablet Commonly known as:  TYLENOL Take 650 mg by mouth every 8 (eight) hours as needed for fever (pain).   amLODipine 5 MG tablet Commonly known as:  NORVASC Take 5 mg by mouth daily.   Basaglar KwikPen 100 UNIT/ML Sopn Inject 17 Units into the skin 2 (two) times daily.   carbamazepine 200 MG tablet Commonly known as:  TEGRETOL Take 600 mg by mouth 2 (two) times daily.   feeding supplement (NEPRO CARB STEADY) Liqd Take 237 mLs by mouth 2 (two) times daily between meals.  gabapentin 100 MG capsule Commonly known as:  NEURONTIN Take 200 mg by mouth 3 (three) times daily.   guaiFENesin 600 MG 12 hr tablet Commonly known as:  MUCINEX Take 600 mg by mouth every 12 (twelve) hours as needed for cough (congestion).   hydrocerin Crea Apply 1 application topically 2 (two) times daily. Apply to body   insulin aspart 100 UNIT/ML injection Commonly known as:  novoLOG 0-15 Units, Subcutaneous, 3 times daily with meals CBG < 70: implement hypoglycemia  protocol CBG 70 - 120: 0 units CBG 121 - 150: 2 units CBG 151 - 200: 3 units CBG 201 - 250: 5 units CBG 251 - 300: 8 units CBG 301 - 350: 11 units CBG 351 - 400: 15 units CBG > 400: call MD   lactulose (encephalopathy) 10 GM/15ML Soln Commonly known as:  CHRONULAC Take 45 mLs (30 g total) by mouth 2 (two) times daily.   linagliptin 5 MG Tabs tablet Commonly known as:  TRADJENTA Take 5 mg by mouth daily.   magnesium oxide 400 MG tablet Commonly known as:  MAG-OX Take 400 mg by mouth daily.   multivitamin Tabs tablet Take 1 tablet by mouth at bedtime.   omeprazole 20 MG tablet Commonly known as:  PRILOSEC OTC Take 40 mg by mouth daily.   ondansetron 4 MG disintegrating tablet Commonly known as:  ZOFRAN-ODT Take 4 mg by mouth every 6 (six) hours as needed for nausea or vomiting.   PHENobarbital 64.8 MG tablet Commonly known as:  LUMINAL Take 194.4 mg by mouth at bedtime. Take with a 32.4 mg tablet for a total dose of 226.8 mg   PHENobarbital 32.4 MG tablet Commonly known as:  LUMINAL Take 32.4 mg by mouth at bedtime. Take with #3  64.8 mg tablets for a total dose of 226.8 mg   rifaximin 550 MG Tabs tablet Commonly known as:  XIFAXAN Take 550 mg by mouth 2 (two) times daily. For diarrhea   simvastatin 20 MG tablet Commonly known as:  ZOCOR Take 20 mg by mouth at bedtime.   sodium bicarbonate 650 MG tablet Take 2 tablets (1,300 mg total) by mouth 3 (three) times daily.   tamsulosin 0.4 MG Caps capsule Commonly known as:  FLOMAX Take 0.4 mg by mouth at bedtime.   Vitamin D 50 MCG (2000 UT) tablet Take 2,000 Units by mouth daily.      Follow-up Information    Practice, [facility]. Schedule an appointment as soon as possible for a visit in 1 week(s).   Contact information: [place] [address] [phone]          Allergies  Allergen Reactions   Fish Oil Swelling and Other (See Comments)          Consultations:   pulmonary/intensive care and nephrology   Other Procedures/Studies: Dg Chest 1 View  Result Date: 04/08/2018 CLINICAL DATA:  Hemodialysis catheter insertion. EXAM: CHEST  1 VIEW COMPARISON:  04/08/2018 FINDINGS: There is a right IJ central venous catheter in the mid SVC. The catheter tip is long lateral wall near the brachiocephalic SVC junction. The endotracheal tube is in good position at the mid tracheal level. There is an NG tube coursing down the esophagus and into the stomach. Stable cardiac enlargement and prominent mediastinum. Persistent pulmonary edema and pleural effusions. Slight improved lung aeration when compared to earlier film. IMPRESSION: 1. Left IJ central venous catheter tip in the SVC near the brachiocephalic SVC junction. 2. Right  subclavian central venous catheter is stable. 3. Stable ET and NG tubes. 4. Persistent cardiac enlargement, central vascular congestion and pulmonary edema but slight improved aeration since earlier film. 5. Small effusions. Electronically Signed   By: Marijo Sanes M.D.   On: 04/08/2018 13:11   Dg Abd 1 View  Result Date: 04/22/2018 CLINICAL DATA:  OG tube placement. EXAM: ABDOMEN - 1 VIEW COMPARISON:  None. FINDINGS: Tip and side port of the enteric tube below the diaphragm in the stomach. Single prominent bowel loop in the upper abdomen is likely transverse colon. Cholecystectomy clips. IMPRESSION: Tip and side port of the enteric tube below the diaphragm in the stomach. Electronically Signed   By: Keith Rake M.D.   On: 04/22/2018 00:06   US Renal  Result Date: 04/07/2018 CLINICAL DATA:  Acute renal failure. EXAM: RENAL / URINARY TRACT ULTRASOUND COMPLETE COMPARISON:  Yesterday. FINDINGS: Right Kidney: Renal measurements: 13.2 x 6.8 x 6 0 cm = volume: 281 mL . Echogenicity within normal limits. No mass or hydronephrosis visualized. Left Kidney: Renal measurements: 13.2 x 6 0 x 5.2 cm = volume: 215 mL. Echogenicity within  normal limits. No mass or hydronephrosis visualized. Bladder: Not visualized with a Foley catheter in place. IMPRESSION: 1. Normal kidneys without hydronephrosis. 2. Nonvisualized urinary bladder with a Foley catheter in place. Electronically Signed   By: Claudie Revering M.D.   On: 04/07/2018 19:38   Dg Chest Port 1 View  Result Date: 04/23/2018 CLINICAL DATA:  Intubated patient admitted 04/21/2018 with altered mental status. EXAM: PORTABLE CHEST 1 VIEW COMPARISON:  Single-view of the chest 04/21/2018 and 04/12/2018. FINDINGS: Right IJ catheter tip projects in the right subclavian vein, unchanged. NG tube courses into the stomach and below the inferior margin the film. Endotracheal tube tip projects at the thoracic inlet and should be advanced approximately 3 cm. Cardiomegaly is again seen. Bilateral airspace disease persists but aeration in the right upper lung zone has improved. IMPRESSION: ETT tip projects at the thoracic inlet. The tube should be advanced approximately 3 cm. Bilateral airspace disease persist but aeration in the right upper lobe is markedly improved. Electronically Signed   By: Inge Rise M.D.   On: 04/23/2018 07:51   Dg Chest Port 1 View  Result Date: 04/21/2018 CLINICAL DATA:  Respiratory failure EXAM: PORTABLE CHEST 1 VIEW COMPARISON:  04/21/2018 FINDINGS: Interval placement of esophagogastric tube, tip and side-port appear to be below the diaphragm although the lower portion of the examination is very underpenetrated, limiting visualization. Slight interval improvement in aeration of the right upper lobe. Otherwise unchanged examination with endotracheal tube projecting below the thoracic inlet, right neck vascular catheter, and cardiomegaly. IMPRESSION: Interval placement of esophagogastric tube, tip and side-port appear to be below the diaphragm although the lower portion of the examination is very underpenetrated, limiting visualization. Slight interval improvement in aeration  of the right upper lobe. Otherwise unchanged examination with endotracheal tube projecting below the thoracic inlet, right neck vascular catheter, and cardiomegaly. Electronically Signed   By: Eddie Candle M.D.   On: 04/21/2018 23:01   Dg Chest Port 1 View  Result Date: 04/12/2018 CLINICAL DATA:  Abnormal respirations. EXAM: PORTABLE CHEST 1 VIEW COMPARISON:  04/10/2018. FINDINGS: Interim extubation and removal of NG tube. Left IJ line noted with tip over superior vena cava. Cardiomegaly. Diffuse bilateral pulmonary infiltrates/edema. Improved aeration right upper lung. No pleural effusion or pneumothorax. IMPRESSION: 1. Interim extubation and removal of NG tube. Left IJ line stable position. 2.  Cardiomegaly, no interim change. 3. Diffuse bilateral pulmonary infiltrates/edema noted. Improved aeration right upper lung. Electronically Signed   By: Marcello Moores  Register   On: 04/12/2018 06:19   Dg Chest Port 1 View  Result Date: 04/10/2018 CLINICAL DATA:  Hypoxia EXAM: PORTABLE CHEST 1 VIEW COMPARISON:  April 09, 2018 FINDINGS: Endotracheal tube tip is 7.5 cm above the carina. Central catheter tip is at the junction of the superior vena cava and left innominate vein. Nasogastric tube tip and side port are below the diaphragm. No pneumothorax. : There is patchy atelectasis in the left lower lobe and right upper lobe regions. There is no appreciable edema or consolidation. Heart is mildly enlarged with pulmonary vascularity normal. No adenopathy. No bone lesions. IMPRESSION: Tube and catheter positions as described without pneumothorax. Areas of patchy atelectasis bilaterally. No frank consolidation or edema. Stable cardiac enlargement. Electronically Signed   By: Lowella Grip III M.D.   On: 04/10/2018 07:16   Dg Chest Port 1 View  Result Date: 04/09/2018 CLINICAL DATA:  Shortness of breath EXAM: PORTABLE CHEST 1 VIEW COMPARISON:  04/08/2018 FINDINGS: Endotracheal tube tip is at the level of the clavicular  heads. Left IJ central venous catheter tip is in the mid SVC. Orogastric tube tip is beyond the field of view. There is moderate cardiomegaly with mild pulmonary edema and small pleural effusions. Right subclavian approach central venous catheter is unchanged. IMPRESSION: Cardiomegaly, small pleural effusions and mild pulmonary edema. Unchanged support apparatus. Electronically Signed   By: Ulyses Jarred M.D.   On: 04/09/2018 06:18   Dg Chest Port 1 View  Result Date: 04/08/2018 CLINICAL DATA:  Respiratory failure EXAM: PORTABLE CHEST 1 VIEW COMPARISON:  04/07/2018 FINDINGS: Support Apparatus: --Endotracheal tube: Tip at the level of the clavicular heads. --Enteric tube:Tip and sideport are below the field of view. --Catheter(s):Right subclavian vein approach central venous catheter tip is at the lower SVC --Other: None Unchanged cardiomegaly and mild pulmonary edema. Failing pleural effusions are also unchanged. IMPRESSION: Unchanged appearance of the chest with findings of congestive heart failure. Unchanged support apparatus. Electronically Signed   By: Ulyses Jarred M.D.   On: 04/08/2018 05:17   Dg Chest Port 1 View  Result Date: 04/07/2018 CLINICAL DATA:  Acute respiratory failure. EXAM: PORTABLE CHEST 1 VIEW COMPARISON:  Earlier today. FINDINGS: Endotracheal tube in satisfactory position. Stable right central venous catheter. Nasogastric tube extending into the stomach. Progressive enlargement of the cardiac silhouette. Diffuse prominence of the pulmonary vasculature and interstitial markings with less bilateral airspace opacity with an improved inspiration. Unremarkable bones. IMPRESSION: Mildly progressive cardiomegaly with grossly stable changes of congestive heart failure. Electronically Signed   By: Claudie Revering M.D.   On: 04/07/2018 19:42     TODAY-DAY OF DISCHARGE:  Subjective:   Ayrton Mcvay today has no headache,no chest abdominal pain,no new weakness tingling or numbness, feels much  better wants to go home today.   Objective:   Blood pressure 133/75, pulse 71, temperature 98.2 F (36.8 C), temperature source Oral, resp. rate 14, height 6' 1"  (1.854 m), weight 122.2 kg, SpO2 99 %.  Intake/Output Summary (Last 24 hours) at 05/05/2018 0909 Last data filed at 05/05/2018 0700 Gross per 24 hour  Intake --  Output 1350 ml  Net -1350 ml   Filed Weights   05/01/18 0510 05/02/18 0434 05/05/18 0445  Weight: 131.7 kg 122.3 kg 122.2 kg    Exam: Awake Alert, Oriented *3, No new F.N deficits, Normal affect [place].AT,PERRAL Supple Neck,No JVD, No  cervical lymphadenopathy appriciated.  Symmetrical Chest wall movement, Good air movement bilaterally, CTAB RRR,No Gallops,Rubs or new Murmurs, No Parasternal Heave +ve B.Sounds, Abd Soft, Non tender, No organomegaly appriciated, No rebound -guarding or rigidity. No Cyanosis, Clubbing or edema, No new Rash or bruise   PERTINENT RADIOLOGIC STUDIES: Dg Chest 1 View  Result Date: 04/08/2018 CLINICAL DATA:  Hemodialysis catheter insertion. EXAM: CHEST  1 VIEW COMPARISON:  04/08/2018 FINDINGS: There is a right IJ central venous catheter in the mid SVC. The catheter tip is long lateral wall near the brachiocephalic SVC junction. The endotracheal tube is in good position at the mid tracheal level. There is an NG tube coursing down the esophagus and into the stomach. Stable cardiac enlargement and prominent mediastinum. Persistent pulmonary edema and pleural effusions. Slight improved lung aeration when compared to earlier film. IMPRESSION: 1. Left IJ central venous catheter tip in the SVC near the brachiocephalic SVC junction. 2. Right subclavian central venous catheter is stable. 3. Stable ET and NG tubes. 4. Persistent cardiac enlargement, central vascular congestion and pulmonary edema but slight improved aeration since earlier film. 5. Small effusions. Electronically Signed   By: Marijo Sanes M.D.   On: 04/08/2018 13:11   Dg Abd 1  View  Result Date: 04/22/2018 CLINICAL DATA:  OG tube placement. EXAM: ABDOMEN - 1 VIEW COMPARISON:  None. FINDINGS: Tip and side port of the enteric tube below the diaphragm in the stomach. Single prominent bowel loop in the upper abdomen is likely transverse colon. Cholecystectomy clips. IMPRESSION: Tip and side port of the enteric tube below the diaphragm in the stomach. Electronically Signed   By: Keith Rake M.D.   On: 04/22/2018 00:06   US Renal  Result Date: 04/07/2018 CLINICAL DATA:  Acute renal failure. EXAM: RENAL / URINARY TRACT ULTRASOUND COMPLETE COMPARISON:  Yesterday. FINDINGS: Right Kidney: Renal measurements: 13.2 x 6.8 x 6 0 cm = volume: 281 mL . Echogenicity within normal limits. No mass or hydronephrosis visualized. Left Kidney: Renal measurements: 13.2 x 6 0 x 5.2 cm = volume: 215 mL. Echogenicity within normal limits. No mass or hydronephrosis visualized. Bladder: Not visualized with a Foley catheter in place. IMPRESSION: 1. Normal kidneys without hydronephrosis. 2. Nonvisualized urinary bladder with a Foley catheter in place. Electronically Signed   By: Claudie Revering M.D.   On: 04/07/2018 19:38   Dg Chest Port 1 View  Result Date: 04/23/2018 CLINICAL DATA:  Intubated patient admitted 04/21/2018 with altered mental status. EXAM: PORTABLE CHEST 1 VIEW COMPARISON:  Single-view of the chest 04/21/2018 and 04/12/2018. FINDINGS: Right IJ catheter tip projects in the right subclavian vein, unchanged. NG tube courses into the stomach and below the inferior margin the film. Endotracheal tube tip projects at the thoracic inlet and should be advanced approximately 3 cm. Cardiomegaly is again seen. Bilateral airspace disease persists but aeration in the right upper lung zone has improved. IMPRESSION: ETT tip projects at the thoracic inlet. The tube should be advanced approximately 3 cm. Bilateral airspace disease persist but aeration in the right upper lobe is markedly improved.  Electronically Signed   By: Inge Rise M.D.   On: 04/23/2018 07:51   Dg Chest Port 1 View  Result Date: 04/21/2018 CLINICAL DATA:  Respiratory failure EXAM: PORTABLE CHEST 1 VIEW COMPARISON:  04/21/2018 FINDINGS: Interval placement of esophagogastric tube, tip and side-port appear to be below the diaphragm although the lower portion of the examination is very underpenetrated, limiting visualization. Slight interval improvement in aeration of the  right upper lobe. Otherwise unchanged examination with endotracheal tube projecting below the thoracic inlet, right neck vascular catheter, and cardiomegaly. IMPRESSION: Interval placement of esophagogastric tube, tip and side-port appear to be below the diaphragm although the lower portion of the examination is very underpenetrated, limiting visualization. Slight interval improvement in aeration of the right upper lobe. Otherwise unchanged examination with endotracheal tube projecting below the thoracic inlet, right neck vascular catheter, and cardiomegaly. Electronically Signed   By: Eddie Candle M.D.   On: 04/21/2018 23:01   Dg Chest Port 1 View  Result Date: 04/12/2018 CLINICAL DATA:  Abnormal respirations. EXAM: PORTABLE CHEST 1 VIEW COMPARISON:  04/10/2018. FINDINGS: Interim extubation and removal of NG tube. Left IJ line noted with tip over superior vena cava. Cardiomegaly. Diffuse bilateral pulmonary infiltrates/edema. Improved aeration right upper lung. No pleural effusion or pneumothorax. IMPRESSION: 1. Interim extubation and removal of NG tube. Left IJ line stable position. 2.  Cardiomegaly, no interim change. 3. Diffuse bilateral pulmonary infiltrates/edema noted. Improved aeration right upper lung. Electronically Signed   By: Marcello Moores  Register   On: 04/12/2018 06:19   Dg Chest Port 1 View  Result Date: 04/10/2018 CLINICAL DATA:  Hypoxia EXAM: PORTABLE CHEST 1 VIEW COMPARISON:  April 09, 2018 FINDINGS: Endotracheal tube tip is 7.5 cm above the  carina. Central catheter tip is at the junction of the superior vena cava and left innominate vein. Nasogastric tube tip and side port are below the diaphragm. No pneumothorax. : There is patchy atelectasis in the left lower lobe and right upper lobe regions. There is no appreciable edema or consolidation. Heart is mildly enlarged with pulmonary vascularity normal. No adenopathy. No bone lesions. IMPRESSION: Tube and catheter positions as described without pneumothorax. Areas of patchy atelectasis bilaterally. No frank consolidation or edema. Stable cardiac enlargement. Electronically Signed   By: Lowella Grip III M.D.   On: 04/10/2018 07:16   Dg Chest Port 1 View  Result Date: 04/09/2018 CLINICAL DATA:  Shortness of breath EXAM: PORTABLE CHEST 1 VIEW COMPARISON:  04/08/2018 FINDINGS: Endotracheal tube tip is at the level of the clavicular heads. Left IJ central venous catheter tip is in the mid SVC. Orogastric tube tip is beyond the field of view. There is moderate cardiomegaly with mild pulmonary edema and small pleural effusions. Right subclavian approach central venous catheter is unchanged. IMPRESSION: Cardiomegaly, small pleural effusions and mild pulmonary edema. Unchanged support apparatus. Electronically Signed   By: Ulyses Jarred M.D.   On: 04/09/2018 06:18   Dg Chest Port 1 View  Result Date: 04/08/2018 CLINICAL DATA:  Respiratory failure EXAM: PORTABLE CHEST 1 VIEW COMPARISON:  04/07/2018 FINDINGS: Support Apparatus: --Endotracheal tube: Tip at the level of the clavicular heads. --Enteric tube:Tip and sideport are below the field of view. --Catheter(s):Right subclavian vein approach central venous catheter tip is at the lower SVC --Other: None Unchanged cardiomegaly and mild pulmonary edema. Failing pleural effusions are also unchanged. IMPRESSION: Unchanged appearance of the chest with findings of congestive heart failure. Unchanged support apparatus. Electronically Signed   By: Ulyses Jarred M.D.   On: 04/08/2018 05:17   Dg Chest Port 1 View  Result Date: 04/07/2018 CLINICAL DATA:  Acute respiratory failure. EXAM: PORTABLE CHEST 1 VIEW COMPARISON:  Earlier today. FINDINGS: Endotracheal tube in satisfactory position. Stable right central venous catheter. Nasogastric tube extending into the stomach. Progressive enlargement of the cardiac silhouette. Diffuse prominence of the pulmonary vasculature and interstitial markings with less bilateral airspace opacity with an improved inspiration. Unremarkable  bones. IMPRESSION: Mildly progressive cardiomegaly with grossly stable changes of congestive heart failure. Electronically Signed   By: Claudie Revering M.D.   On: 04/07/2018 19:42     PERTINENT LAB RESULTS: CBC: Recent Labs    05/03/18 0238  WBC 4.4  HGB 8.8*  HCT 29.5*  PLT 107*   CMET CMP     Component Value Date/Time   NA 143 05/03/2018 0238   K 3.9 05/03/2018 0238   CL 119 (H) 05/03/2018 0238   CO2 15 (L) 05/03/2018 0238   GLUCOSE 232 (H) 05/03/2018 0238   BUN 43 (H) 05/03/2018 0238   CREATININE 1.86 (H) 05/03/2018 0238   CALCIUM 8.1 (L) 05/03/2018 0238   PROT 5.6 (L) 04/25/2018 0631   ALBUMIN 2.4 (L) 04/27/2018 0925   AST 11 (L) 04/25/2018 0631   ALT 9 04/25/2018 0631   ALKPHOS 118 04/25/2018 0631   BILITOT 1.2 04/25/2018 0631   GFRNONAA 42 (L) 05/03/2018 0238   GFRAA 49 (L) 05/03/2018 0238    GFR Estimated Creatinine Clearance: 67.2 mL/min (A) (by C-G formula based on SCr of 1.86 mg/dL (H)). No results for input(s): LIPASE, AMYLASE in the last 72 hours. No results for input(s): CKTOTAL, CKMB, CKMBINDEX, TROPONINI in the last 72 hours. Invalid input(s): POCBNP No results for input(s): DDIMER in the last 72 hours. No results for input(s): HGBA1C in the last 72 hours. No results for input(s): CHOL, HDL, LDLCALC, TRIG, CHOLHDL, LDLDIRECT in the last 72 hours. No results for input(s): TSH, T4TOTAL, T3FREE, THYROIDAB in the last 72 hours.  Invalid  input(s): FREET3 No results for input(s): VITAMINB12, FOLATE, FERRITIN, TIBC, IRON, RETICCTPCT in the last 72 hours. Coags: No results for input(s): INR in the last 72 hours.  Invalid input(s): PT Microbiology: Recent Results (from the past 240 hour(s))  Novel Coronavirus, NAA (hospital order; send-out to ref lab)     Status: None   Collection Time: 05/02/18 11:00 AM  Result Value Ref Range Status   SARS-CoV-2, NAA NOT DETECTED NOT DETECTED Final    Comment: Negative (Not Detected) results do not exclude infection caused by SARS CoV 2 and should not be used as the sole basis for treatment or other patient management decisions. Optimum specimen types and timing for peak viral levels during infections caused  by SARS CoV 2 have not been determined. Collection of multiple specimens (types and time points) from the same patient may be necessary to detect the virus. Improper specimen collection and handling, sequence variability underlying assay primers and or probes, or the presence of organisms in  quantities less than the limit of detection of the assay may lead to false negative results. Positive and negative predictive values of testing are highly dependent on prevalence. False negative results are more likely when prevalence of disease is high. (NOTE) The expected result is Negative (Not Detected). The SARS CoV 2 test is intended for the presumptive qualitative  detection of nucleic acid from SARS CoV 2 in upper and lower  respir atory specimens. Testing methodology is real time RT PCR. Test results must be correlated with clinical presentation and  evaluated in the context of other laboratory and epidemiologic data.  Test performance can be affected because the epidemiology and  clinical spectrum of infection caused by SARS CoV 2 is not fully  known. For example, the optimum types of specimens to collect and  when during the course of infection these specimens are most likely  to  contain detectable viral RNA may not  be known. This test has not been Food and Drug Administration (FDA) cleared or  approved and has been authorized by FDA under an Emergency Use  Authorization (EUA). The test is only authorized for the duration of  the declaration that circumstances exist justifying the authorization  of emergency use of in vitro diagnostic tests for detection and or  diagnosis of SARS CoV 2 under Section 564(b)(1) of the Act, 21 U.S.C.  section [phone] 3(b)(1), unless the authorization is terminated or   revoked sooner. [facility] is certified under the  Clinical Laboratory Improvement Amendments of 1988 (CLIA), 42 U.S.C.  section [phone], to perform high complexity tests. Performed at [facility] [id number] [address], [address], [place], [place] [address] Laboratory Director: Loleta Books, MD Fact Sheet for Healthcare Providers  BankingDealers.co.za Fact Sheet for Patients  StrictlyIdeas.no    Coronavirus Source NASOPHARYNGEAL  Final    Comment: Performed at [facility], [address], [place], [place] [address]    FURTHER DISCHARGE INSTRUCTIONS:  Get Medicines reviewed and adjusted: Please take all your medications with you for your next visit with your Primary MD  Laboratory/radiological data: Please request your Primary MD to go over all hospital tests and procedure/radiological results at the follow up, please ask your Primary MD to get all Hospital records sent to his/her office.  In some cases, they will be blood work, cultures and biopsy results pending at the time of your discharge. Please request that your primary care M.D. goes through all the records of your hospital data and follows up on these results.  Also Note the following: If you experience worsening of your admission symptoms, develop shortness of breath, life threatening emergency,  suicidal or homicidal thoughts you must seek medical attention immediately by calling 911 or calling your MD immediately  if symptoms less severe.  You must read complete instructions/literature along with all the possible adverse reactions/side effects for all the Medicines you take and that have been prescribed to you. Take any new Medicines after you have completely understood and accpet all the possible adverse reactions/side effects.   Do not drive when taking Pain medications or sleeping medications (Benzodaizepines)  Do not take more than prescribed Pain, Sleep and Anxiety Medications. It is not advisable to combine anxiety,sleep and pain medications without talking with your primary care practitioner  Special Instructions: If you have smoked or chewed Tobacco  in the last 2 yrs please stop smoking, stop any regular Alcohol  and or any Recreational drug use.  Wear Seat belts while driving.  Please note: You were cared for by a hospitalist during your hospital stay. Once you are discharged, your primary care physician will handle any further medical issues. Please note that NO REFILLS for any discharge medications will be authorized once you are discharged, as it is imperative that you return to your primary care physician (or establish a relationship with a primary care physician if you do not have one) for your post hospital discharge needs so that they can reassess your need for medications and monitor your lab values.  Total Time spent coordinating discharge including counseling, education and face to face time equals 35 minutes.  Signed: Lala Lund 05/05/2018 9:09 AM

## 2018-05-01 LAB — GLUCOSE, CAPILLARY
Glucose-Capillary: 74 mg/dL (ref 70–99)
Glucose-Capillary: 117 mg/dL — ABNORMAL HIGH (ref 70–99)
Glucose-Capillary: 279 mg/dL — ABNORMAL HIGH (ref 70–99)

## 2018-05-01 NOTE — Progress Notes (Signed)
Nutrition Follow Up  RD working remotely  [place]:   Obesity unspecified  INTERVENTION:    Nepro Shake po TID, each supplement provides 425 kcal and 19 grams protein once diet advanced.   Renal MVI daily  NUTRITION DIAGNOSIS:   Increased nutrient needs related to chronic illness(ESRD on HD) as evidenced by estimated needs, ongoing  GOAL:   Patient will meet greater than or equal to 90% of their needs, met  MONITOR:   Diet advancement, Supplement acceptance, PO intake, Labs, Weight trends, I & O's, Skin  ASSESSMENT:   Patient with PMH significant for ESRD on HD, DM, seizures, and HTN. Recently admitted 7/68-1/15 for complicated UTI and required CRRT. Transferred from [facility] for [place] from unclear etiology. Suspected missed HD treatments and refusal of medication prior to admission but documentation unclear.    3/30 extubated  S/p bedside swallow evaluation 3/31. Advanced to Regular, thin liquid diet. PO intake 100% per flowsheet records.  Drinking his Nepro Shake supplements. Labs & medications reviewed. CBG's [phone].  Medically stable for discharge. Awaiting SNF placement.  Diet Order:   Diet Order            Diet - low sodium heart healthy        Diet Carb Modified        Diet Carb Modified Fluid consistency: Thin; Room service appropriate? Yes with Assist  Diet effective now             EDUCATION NEEDS:   Not appropriate for education at this time  Skin:  Skin Assessment: Skin Integrity Issues: Skin Integrity Issues:: Stage I, Other (Comment) Stage I: coccyx Other: non-pressure- coccyx/medial sacrum  Last BM:  4/7   Intake/Output Summary (Last 24 hours) at 05/01/2018 1618 Last data filed at 05/01/2018 1034 Gross per 24 hour  Intake 240 ml  Output 1800 ml  Net -1560 ml   Height:   Ht Readings from Last 1 Encounters:  04/21/18 6' (1.829 m)   Weight:   Wt Readings from Last 1 Encounters:  05/01/18 131.7 kg   Ideal  Body Weight:  80.9 kg  BMI:  Body mass index is 39.38 kg/m.  Estimated Nutritional Needs:   Kcal:  2300-2500  Protein:  120-135 gm  Fluid:  1,000 ml + UOP  Arthur Holms, RD, LDN Pager #: [phone] After-Hours Pager #: [phone]

## 2018-05-01 NOTE — Progress Notes (Signed)
Lying comfortably in bed-no complaints.  Blood pressure 134/77, pulse 66, temperature 98 F (36.7 C), temperature source Oral, resp. rate 18, height 6' (1.829 m), weight 131.7 kg, SpO2 100 %.  Awaiting SNF bed-stable for discharge to SNF when bed is available.  See discharge summary for details.

## 2018-05-01 NOTE — Progress Notes (Signed)
PROGRESS NOTE        PATIENT DETAILS Name: Eddie Davies Age: 48 y.o. Sex: male Date of Birth: November 02, 1970 Admit Date: 04/21/2018 Admitting Physician Collene Gobble, MD SAY:[id number], [facility]  Brief Narrative: Patient is a 48 y.o. male with history of with history of CKD stage 3, presumed liver cirrhosis, hypertension, seizures-transferred from [facility] for altered mental status, acute hypoxic respiratory failure in the setting of ESBL Klebsiella pneumoniae HCAP.  Per H&P, patient was refusing medications for ESLD, and had recently refused HD.  Patient was managed in the intensive care unit, extubated on 3/30-and transferred to the triad hospitalist service.  See below for further details.  Subjective: Sleeping when I walked in-but awakes easily-denies any chest pain or shortness of breath.  Assessment/Plan: Acute metabolic encephalopathy: Resolved-he is completely awake and alert.  Suspect encephalopathy was multifactorial-secondary to hypoxia, pneumonia, worsening renal function.  Acute hypoxic respiratory failure: Intubated for airway protection-some amount of hypoxia is likely secondary to right upper lobe infiltrate.  Sputum culture positive for ESBL Klebsiella pneumonia-continue meropenem.  He was extubated on 3/30-currently on room air.  Lungs are clear to auscultation.    ESBL Klebsiella pneumonia: Afebrile-lungs are clear-on room air-sputum culture positive for.  Continue meropenem for total of 7 days-last dose on 4/7.  Per SW-SNF not taking patient back on third of COVID testing is done-very low risk at this point-and improving with current care.  I have reached out to hospitalist director at this point-we will await further guidance-but at this point do not think there is no indication for any further testing.  AKI on CKD stage III: Likely hemodynamically mediated in the setting of hypotension, UTI-renal function improving-renal  function has improved with supportive care-creatinine now close to usual baseline.   Uncontrolled DM-2 with hyperglycemia: CBGs much improved-continue Lantus 15 units twice daily and SSI.   Anemia: Likely secondary to acute illness on top of anemia of CKD.  No evidence of bleeding-follow hemoglobin periodically.  Has been transfused 1 unit of PRBC on 3/30.  Follow periodically  Hypokalemia: Repleted.  Recheck periodically.  Presumed liver cirrhosis: Appears to be well compensated at present-he is awake and alert with no evidence of encephalopathy-remains on rifaximin and lactulose.  Seizure disorder: Stable-continue phenobarbital and Tegretol.  Hypertension: BP controlled-continue monitoring without the use of any antihypertensives  BPH: Continue Flomax  Cognitive delay/deconditioning: Mostly wheelchair bound-has some amount of cognitive delay-resident of SNF   DVT Prophylaxis:  Heparin   Code Status: Full code  Family Communication: None at bedside  Disposition Plan: Remain inpatient-SNF on discharge over the next few days  Antimicrobial agents: Anti-infectives (From admission, onward)   Start     Dose/Rate Route Frequency Ordered Stop   04/28/18 1400  meropenem (MERREM) 1 g in sodium chloride 0.9 % 100 mL IVPB  Status:  Discontinued     1 g 200 mL/hr over 30 Minutes Intravenous Every 8 hours 04/28/18 1036 05/01/18 0948   04/25/18 1800  meropenem (MERREM) 1 g in sodium chloride 0.9 % 100 mL IVPB  Status:  Discontinued     1 g 200 mL/hr over 30 Minutes Intravenous Every 12 hours 04/25/18 1641 04/28/18 1036   04/24/18 2200  rifaximin (XIFAXAN) tablet 550 mg     550 mg Oral 2 times daily 04/24/18 1810     04/24/18 1200  meropenem (MERREM) 500 mg in sodium chloride 0.9 % 100 mL IVPB  Status:  Discontinued     500 mg 200 mL/hr over 30 Minutes Intravenous Every 24 hours 04/24/18 1044 04/25/18 1641   04/21/18 2359  ceFEPIme (MAXIPIME) 1 g in sodium chloride 0.9 % 100 mL IVPB   Status:  Discontinued     1 g 200 mL/hr over 30 Minutes Intravenous Daily at bedtime 04/21/18 2247 04/24/18 1044   04/21/18 2359  vancomycin (VANCOCIN) 2,000 mg in sodium chloride 0.9 % 500 mL IVPB     2,000 mg 250 mL/hr over 120 Minutes Intravenous  Once 04/21/18 2316 04/22/18 0305   04/21/18 2319  vancomycin variable dose per unstable renal function (pharmacist dosing)  Status:  Discontinued      Does not apply See admin instructions 04/21/18 2319 04/24/18 1044   04/21/18 2230  rifaximin (XIFAXAN) tablet 550 mg  Status:  Discontinued     550 mg Per Tube 2 times daily 04/21/18 2223 04/24/18 1810      Procedures: None  CONSULTS: PCCM Renal  Time spent: 25 minutes-Greater than 50% of this time was spent in counseling, explanation of diagnosis, planning of further management, and coordination of care.  MEDICATIONS: Scheduled Meds: . calcium acetate  1,334 mg Oral TID WC  . carbamazepine  600 mg Oral BID  . chlorhexidine gluconate (MEDLINE KIT)  15 mL Mouth Rinse BID  . feeding supplement (NEPRO CARB STEADY)  237 mL Oral BID BM  . heparin injection (subcutaneous)  5,000 Units Subcutaneous Q8H  . hydrocerin   Topical BID  . insulin aspart  0-15 Units Subcutaneous TID WC  . insulin glargine  15 Units Subcutaneous BID  . lactulose  30 g Oral TID  . mouth rinse  15 mL Mouth Rinse QID  . multivitamin  1 tablet Oral QHS  . phenobarbital  97.2 mg Oral QHS  . rifaximin  550 mg Oral BID  . senna-docusate  1 tablet Oral BID  . sodium bicarbonate  1,300 mg Oral TID  . sodium chloride flush  10-40 mL Intracatheter Q12H  . tamsulosin  0.4 mg Oral QHS   Continuous Infusions: . sodium chloride 250 mL (04/29/18 0551)   PRN Meds:.sodium chloride, sodium chloride flush   PHYSICAL EXAM: Vital signs: Vitals:   04/30/18 1438 04/30/18 2054 05/01/18 0510 05/01/18 1519  BP: (!) 129/57 (!) 149/79 134/77 (!) 151/78  Pulse: 70 65 66 65  Resp: 19 16 18 18   Temp:  98.2 F (36.8 C) 98 F  (36.7 C) 97.9 F (36.6 C)  TempSrc:  Oral Oral Oral  SpO2: 98% 99% 100% 98%  Weight:   131.7 kg   Height:       Filed Weights   04/23/18 0000 04/28/18 0535 05/01/18 0510  Weight: (!) 136.6 kg 131.7 kg 131.7 kg   Body mass index is 39.38 kg/m.   General appearance:Awake, alert, not in any distress.  Eyes:no scleral icterus. HEENT: Atraumatic and Normocephalic Neck: supple, no JVD. Resp:Good air entry bilaterally,no rales or rhonchi CVS: S1 S2 regular, no murmurs.  GI: Bowel sounds present, Non tender and not distended with no gaurding, rigidity or rebound. Extremities: B/L Lower Ext shows no edema, both legs are warm to touch Neurology: Moves all 4 extremities Psychiatric: Normal judgment and insight. Normal mood. Musculoskeletal:No digital cyanosis Wounds:N/A  I have personally reviewed following labs and imaging studies  LABORATORY DATA: CBC: Recent Labs  Lab 04/25/18 0631 04/27/18 0925 04/27/18 1048  WBC  4.0 3.0* 2.8*  HGB 7.8* 8.2* 8.2*  HCT 25.5* 26.4* 25.7*  MCV 98.1 93.6 93.1  PLT 160 124* 121*    Basic Metabolic Panel: Recent Labs  Lab 04/25/18 0631  04/26/18 0316 04/27/18 0925 04/28/18 0315 04/29/18 0516 04/30/18 0345  NA 139   < > 136 143 143 142 140  K 3.4*   < > 2.8* 3.4* 3.3* 3.7 3.9  CL 110   < > 110 118* 117* 116* 116*  CO2 13*   < > 18* 14* 17* 17* 17*  GLUCOSE 468*   < > 397* 165* 168* 107* 187*  BUN 45*   < > 45* 46* 45* 41* 39*  CREATININE 3.86*   < > 3.26* 2.65* 2.21* 2.02* 1.87*  CALCIUM 7.5*   < > 7.3* 7.6* 7.4* 7.7* 7.8*  MG  --   --   --  2.0 1.9  --   --   PHOS 10.2*  --  8.3* 7.3*  --   --   --    < > = values in this interval not displayed.    GFR: Estimated Creatinine Clearance: 68.5 mL/min (A) (by C-G formula based on SCr of 1.87 mg/dL (H)).  Liver Function Tests: Recent Labs  Lab 04/25/18 0631 04/26/18 0316 04/27/18 0925  AST 11*  --   --   ALT 9  --   --   ALKPHOS 118  --   --   BILITOT 1.2  --   --   PROT  5.6*  --   --   ALBUMIN 2.2* 2.1* 2.4*   No results for input(s): LIPASE, AMYLASE in the last 168 hours. No results for input(s): AMMONIA in the last 168 hours.  Coagulation Profile: No results for input(s): INR, PROTIME in the last 168 hours.  Cardiac Enzymes: No results for input(s): CKTOTAL, CKMB, CKMBINDEX, TROPONINI in the last 168 hours.  BNP (last 3 results) No results for input(s): PROBNP in the last 8760 hours.  HbA1C: No results for input(s): HGBA1C in the last 72 hours.  CBG: Recent Labs  Lab 04/30/18 1212 04/30/18 1653 04/30/18 2051 05/01/18 0745 05/01/18 1153  GLUCAP 136* 217* 269* 74 117*    Lipid Profile: No results for input(s): CHOL, HDL, LDLCALC, TRIG, CHOLHDL, LDLDIRECT in the last 72 hours.  Thyroid Function Tests: No results for input(s): TSH, T4TOTAL, FREET4, T3FREE, THYROIDAB in the last 72 hours.  Anemia Panel: No results for input(s): VITAMINB12, FOLATE, FERRITIN, TIBC, IRON, RETICCTPCT in the last 72 hours.  Urine analysis:    Component Value Date/Time   COLORURINE AMBER (A) 04/08/2018 1306   APPEARANCEUR CLOUDY (A) 04/08/2018 1306   LABSPEC 1.018 04/08/2018 1306   PHURINE 5.0 04/08/2018 1306   GLUCOSEU NEGATIVE 04/08/2018 1306   HGBUR MODERATE (A) 04/08/2018 1306   BILIRUBINUR NEGATIVE 04/08/2018 1306   KETONESUR NEGATIVE 04/08/2018 1306   PROTEINUR 100 (A) 04/08/2018 1306   NITRITE NEGATIVE 04/08/2018 1306   LEUKOCYTESUR LARGE (A) 04/08/2018 1306    Sepsis Labs: Lactic Acid, Venous    Component Value Date/Time   LATICACIDVEN 0.9 04/22/2018 0419    MICROBIOLOGY: Recent Results (from the past 240 hour(s))  MRSA PCR Screening     Status: None   Collection Time: 04/21/18  9:33 PM  Result Value Ref Range Status   MRSA by PCR NEGATIVE NEGATIVE Final    Comment:        The GeneXpert MRSA Assay (FDA approved for NASAL specimens only), is one component  of a comprehensive MRSA colonization surveillance program. It is not  intended to diagnose MRSA infection nor to guide or monitor treatment for MRSA infections. Performed at [facility], [place] [address], [place], [place] [address]   Culture, blood (routine x 2) Call MD if unable to obtain prior to antibiotics being given     Status: None   Collection Time: 04/21/18 11:24 PM  Result Value Ref Range Status   Specimen Description BLOOD RIGHT ANTECUBITAL  Final   Special Requests   Final    BOTTLES DRAWN AEROBIC AND ANAEROBIC Blood Culture adequate volume   Culture   Final    NO GROWTH 5 DAYS Performed at [facility], [place] [address] [place], [place] [address]    Report Status 04/27/2018 FINAL  Final  Culture, blood (routine x 2) Call MD if unable to obtain prior to antibiotics being given     Status: None   Collection Time: 04/21/18 11:24 PM  Result Value Ref Range Status   Specimen Description BLOOD RIGHT ANTECUBITAL  Final   Special Requests   Final    BOTTLES DRAWN AEROBIC AND ANAEROBIC Blood Culture adequate volume   Culture   Final    NO GROWTH 5 DAYS Performed at [facility], [place] [address] [place], [place] [address]    Report Status 04/27/2018 FINAL  Final  Culture, sputum-assessment     Status: None   Collection Time: 04/22/18 10:27 AM  Result Value Ref Range Status   Specimen Description TRACHEAL ASPIRATE  Final   Special Requests Normal  Final   Sputum evaluation   Final    THIS SPECIMEN IS ACCEPTABLE FOR SPUTUM CULTURE Performed at [facility], [place] [address], [place], [place] [address]    Report Status 04/22/2018 FINAL  Final  Culture, respiratory     Status: None   Collection Time: 04/22/18 10:27 AM  Result Value Ref Range Status   Specimen Description TRACHEAL ASPIRATE  Final   Special Requests Normal Reflexed from [id number]  Final   Gram Stain   Final    FEW WBC PRESENT, PREDOMINANTLY PMN MODERATE GRAM NEGATIVE RODS RARE BUDDING YEAST SEEN Performed at [facility], [place] [address],  [place], [place] [address]    Culture   Final    ABUNDANT KLEBSIELLA PNEUMONIAE Confirmed Extended Spectrum Beta-Lactamase Producer (ESBL).  In bloodstream infections from ESBL organisms, carbapenems are preferred over piperacillin/tazobactam. They are shown to have a lower risk of mortality.    Report Status 04/24/2018 FINAL  Final   Organism ID, Bacteria KLEBSIELLA PNEUMONIAE  Final      Susceptibility   Klebsiella pneumoniae - MIC*    AMPICILLIN >=32 RESISTANT Resistant     CEFAZOLIN >=64 RESISTANT Resistant     CEFEPIME RESISTANT Resistant     CEFTAZIDIME RESISTANT Resistant     CEFTRIAXONE RESISTANT Resistant     CIPROFLOXACIN 1 SENSITIVE Sensitive     GENTAMICIN 8 INTERMEDIATE Intermediate     IMIPENEM <=0.25 SENSITIVE Sensitive     TRIMETH/SULFA >=320 RESISTANT Resistant     AMPICILLIN/SULBACTAM 8 SENSITIVE Sensitive     PIP/TAZO <=4 SENSITIVE Sensitive     Extended ESBL POSITIVE Resistant     * ABUNDANT KLEBSIELLA PNEUMONIAE  Culture, Urine     Status: Abnormal   Collection Time: 04/23/18 11:10 AM  Result Value Ref Range Status   Specimen Description URINE, CATHETERIZED  Final   Special Requests   Final  NONE Performed at [facility], [place] [address] [place], [place] [address]    Culture (A)  Final    10,000 COLONIES/mL HAFNIA ALVEI 10,000 COLONIES/mL YEAST    Report Status 04/25/2018 FINAL  Final   Organism ID, Bacteria HAFNIA ALVEI (A)  Final      Susceptibility   Hafnia alvei - MIC*    AMPICILLIN >=32 RESISTANT Resistant     CEFAZOLIN >=64 RESISTANT Resistant     CEFTRIAXONE 32 INTERMEDIATE Intermediate     CIPROFLOXACIN <=0.25 SENSITIVE Sensitive     GENTAMICIN <=1 SENSITIVE Sensitive     IMIPENEM <=0.25 SENSITIVE Sensitive     NITROFURANTOIN <=16 SENSITIVE Sensitive     TRIMETH/SULFA <=20 SENSITIVE Sensitive     AMPICILLIN/SULBACTAM >=32 RESISTANT Resistant     PIP/TAZO >=128 RESISTANT Resistant     * 10,000 COLONIES/mL HAFNIA ALVEI    RADIOLOGY  STUDIES/RESULTS: Dg Chest 1 View  Result Date: 04/08/2018 CLINICAL DATA:  Hemodialysis catheter insertion. EXAM: CHEST  1 VIEW COMPARISON:  04/08/2018 FINDINGS: There is a right IJ central venous catheter in the mid SVC. The catheter tip is long lateral wall near the brachiocephalic SVC junction. The endotracheal tube is in good position at the mid tracheal level. There is an NG tube coursing down the esophagus and into the stomach. Stable cardiac enlargement and prominent mediastinum. Persistent pulmonary edema and pleural effusions. Slight improved lung aeration when compared to earlier film. IMPRESSION: 1. Left IJ central venous catheter tip in the SVC near the brachiocephalic SVC junction. 2. Right subclavian central venous catheter is stable. 3. Stable ET and NG tubes. 4. Persistent cardiac enlargement, central vascular congestion and pulmonary edema but slight improved aeration since earlier film. 5. Small effusions. Electronically Signed   By: Marijo Sanes M.D.   On: 04/08/2018 13:11   Dg Abd 1 View  Result Date: 04/22/2018 CLINICAL DATA:  OG tube placement. EXAM: ABDOMEN - 1 VIEW COMPARISON:  None. FINDINGS: Tip and side port of the enteric tube below the diaphragm in the stomach. Single prominent bowel loop in the upper abdomen is likely transverse colon. Cholecystectomy clips. IMPRESSION: Tip and side port of the enteric tube below the diaphragm in the stomach. Electronically Signed   By: Keith Rake M.D.   On: 04/22/2018 00:06   US Renal  Result Date: 04/07/2018 CLINICAL DATA:  Acute renal failure. EXAM: RENAL / URINARY TRACT ULTRASOUND COMPLETE COMPARISON:  Yesterday. FINDINGS: Right Kidney: Renal measurements: 13.2 x 6.8 x 6 0 cm = volume: 281 mL . Echogenicity within normal limits. No mass or hydronephrosis visualized. Left Kidney: Renal measurements: 13.2 x 6 0 x 5.2 cm = volume: 215 mL. Echogenicity within normal limits. No mass or hydronephrosis visualized. Bladder: Not visualized  with a Foley catheter in place. IMPRESSION: 1. Normal kidneys without hydronephrosis. 2. Nonvisualized urinary bladder with a Foley catheter in place. Electronically Signed   By: Claudie Revering M.D.   On: 04/07/2018 19:38   Dg Chest Port 1 View  Result Date: 04/23/2018 CLINICAL DATA:  Intubated patient admitted 04/21/2018 with altered mental status. EXAM: PORTABLE CHEST 1 VIEW COMPARISON:  Single-view of the chest 04/21/2018 and 04/12/2018. FINDINGS: Right IJ catheter tip projects in the right subclavian vein, unchanged. NG tube courses into the stomach and below the inferior margin the film. Endotracheal tube tip projects at the thoracic inlet and should be advanced approximately 3 cm. Cardiomegaly is again seen. Bilateral airspace disease persists but aeration in the right upper lung  zone has improved. IMPRESSION: ETT tip projects at the thoracic inlet. The tube should be advanced approximately 3 cm. Bilateral airspace disease persist but aeration in the right upper lobe is markedly improved. Electronically Signed   By: Inge Rise M.D.   On: 04/23/2018 07:51   Dg Chest Port 1 View  Result Date: 04/21/2018 CLINICAL DATA:  Respiratory failure EXAM: PORTABLE CHEST 1 VIEW COMPARISON:  04/21/2018 FINDINGS: Interval placement of esophagogastric tube, tip and side-port appear to be below the diaphragm although the lower portion of the examination is very underpenetrated, limiting visualization. Slight interval improvement in aeration of the right upper lobe. Otherwise unchanged examination with endotracheal tube projecting below the thoracic inlet, right neck vascular catheter, and cardiomegaly. IMPRESSION: Interval placement of esophagogastric tube, tip and side-port appear to be below the diaphragm although the lower portion of the examination is very underpenetrated, limiting visualization. Slight interval improvement in aeration of the right upper lobe. Otherwise unchanged examination with endotracheal  tube projecting below the thoracic inlet, right neck vascular catheter, and cardiomegaly. Electronically Signed   By: Eddie Candle M.D.   On: 04/21/2018 23:01   Dg Chest Port 1 View  Result Date: 04/12/2018 CLINICAL DATA:  Abnormal respirations. EXAM: PORTABLE CHEST 1 VIEW COMPARISON:  04/10/2018. FINDINGS: Interim extubation and removal of NG tube. Left IJ line noted with tip over superior vena cava. Cardiomegaly. Diffuse bilateral pulmonary infiltrates/edema. Improved aeration right upper lung. No pleural effusion or pneumothorax. IMPRESSION: 1. Interim extubation and removal of NG tube. Left IJ line stable position. 2.  Cardiomegaly, no interim change. 3. Diffuse bilateral pulmonary infiltrates/edema noted. Improved aeration right upper lung. Electronically Signed   By: Marcello Moores  Register   On: 04/12/2018 06:19   Dg Chest Port 1 View  Result Date: 04/10/2018 CLINICAL DATA:  Hypoxia EXAM: PORTABLE CHEST 1 VIEW COMPARISON:  April 09, 2018 FINDINGS: Endotracheal tube tip is 7.5 cm above the carina. Central catheter tip is at the junction of the superior vena cava and left innominate vein. Nasogastric tube tip and side port are below the diaphragm. No pneumothorax. : There is patchy atelectasis in the left lower lobe and right upper lobe regions. There is no appreciable edema or consolidation. Heart is mildly enlarged with pulmonary vascularity normal. No adenopathy. No bone lesions. IMPRESSION: Tube and catheter positions as described without pneumothorax. Areas of patchy atelectasis bilaterally. No frank consolidation or edema. Stable cardiac enlargement. Electronically Signed   By: Lowella Grip III M.D.   On: 04/10/2018 07:16   Dg Chest Port 1 View  Result Date: 04/09/2018 CLINICAL DATA:  Shortness of breath EXAM: PORTABLE CHEST 1 VIEW COMPARISON:  04/08/2018 FINDINGS: Endotracheal tube tip is at the level of the clavicular heads. Left IJ central venous catheter tip is in the mid SVC. Orogastric  tube tip is beyond the field of view. There is moderate cardiomegaly with mild pulmonary edema and small pleural effusions. Right subclavian approach central venous catheter is unchanged. IMPRESSION: Cardiomegaly, small pleural effusions and mild pulmonary edema. Unchanged support apparatus. Electronically Signed   By: Ulyses Jarred M.D.   On: 04/09/2018 06:18   Dg Chest Port 1 View  Result Date: 04/08/2018 CLINICAL DATA:  Respiratory failure EXAM: PORTABLE CHEST 1 VIEW COMPARISON:  04/07/2018 FINDINGS: Support Apparatus: --Endotracheal tube: Tip at the level of the clavicular heads. --Enteric tube:Tip and sideport are below the field of view. --Catheter(s):Right subclavian vein approach central venous catheter tip is at the lower SVC --Other: None Unchanged cardiomegaly and mild  pulmonary edema. Failing pleural effusions are also unchanged. IMPRESSION: Unchanged appearance of the chest with findings of congestive heart failure. Unchanged support apparatus. Electronically Signed   By: Ulyses Jarred M.D.   On: 04/08/2018 05:17   Dg Chest Port 1 View  Result Date: 04/07/2018 CLINICAL DATA:  Acute respiratory failure. EXAM: PORTABLE CHEST 1 VIEW COMPARISON:  Earlier today. FINDINGS: Endotracheal tube in satisfactory position. Stable right central venous catheter. Nasogastric tube extending into the stomach. Progressive enlargement of the cardiac silhouette. Diffuse prominence of the pulmonary vasculature and interstitial markings with less bilateral airspace opacity with an improved inspiration. Unremarkable bones. IMPRESSION: Mildly progressive cardiomegaly with grossly stable changes of congestive heart failure. Electronically Signed   By: Claudie Revering M.D.   On: 04/07/2018 19:42     LOS: 10 days   Oren Binet, MD  [facility]  If 7PM-7AM, please contact night-coverage  Please page via [email or web address]  Go to [email or web address] and use [facility] universal password to access. If you do not have  the password, please contact the hospital operator.  Locate the [facility] provider you are looking for under [facility] and page to a number that you can be directly reached. If you still have difficulty reaching the provider, please page the [facility] (Director on Call) for the Hospitalists listed on amion for assistance.  05/01/2018, 3:44 PM

## 2018-05-01 NOTE — Progress Notes (Signed)
PT Cancellation Note  Patient Details Name: Eddie Davies MRN: [id number] DOB: Mar 07, 1970   Cancelled Treatment:    Reason Eval/Treat Not Completed: Patient declined, no reason specified Pt refusing stating, "I'm supposed to be getting out of here." Attempted to encourage pt to get OOB to eat lunch, however, pt continued to refuse. Voiced frustrations that he had not been discharged yet; acknowledged concerns. Will reattempt as schedule allows.   Eddie Davies, PT, DPT  Acute Rehabilitation Services  Pager: [phone] Office: [phone]    Eddie Davies 05/01/2018, 1:36 PM

## 2018-05-02 LAB — GLUCOSE, CAPILLARY
Glucose-Capillary: 137 mg/dL — ABNORMAL HIGH (ref 70–99)
Glucose-Capillary: 235 mg/dL — ABNORMAL HIGH (ref 70–99)
Glucose-Capillary: 265 mg/dL — ABNORMAL HIGH (ref 70–99)
Glucose-Capillary: 262 mg/dL — ABNORMAL HIGH (ref 70–99)

## 2018-05-02 NOTE — Progress Notes (Addendum)
Physical Therapy Treatment Patient Details Name: Eddie Davies MRN: [id number] DOB: Jun 01, 1970 Today's Date: 05/02/2018    History of Present Illness Patient is a 48 y/o male who presents from [facility] with AMS and acute respiratory failure with hypoxemia; intubated 3/28- 3/30. Recent admission 3/14-3/21 with septic shock from complicated UTI. CXR- RUL infiltrate. PMH includes dm, seizures, htn, esld, esrd ckd stage IV, cognitive delays.     PT Comments    Pt very limited this session secondary to agitation/frustration with therapist's attempts to assist with mobilization. Pt continuing to refuse OOB to chair or even to sit EOB despite therapist's education and max encouragement. Pt would continue to benefit from skilled physical therapy services at this time while admitted and after d/c to address the below listed limitations in order to improve overall safety and independence with functional mobility.    Follow Up Recommendations  SNF;Supervision for mobility/OOB     Equipment Recommendations  Other (comment)(defer to next venue)    Recommendations for Other Services       Precautions / Restrictions Precautions Precautions: Fall Precaution Comments: flexiseal Restrictions Weight Bearing Restrictions: No    Mobility  Bed Mobility Overal bed mobility: Needs Assistance Bed Mobility: Rolling Rolling: Min guard         General bed mobility comments: min guard for safety, increased time and effort needed, use of bed rail  Transfers Overall transfer level: Needs assistance               General transfer comment: despite max encouragement pt adamantly refusing to perform any transfers or to even sit EOB to eat lunch  Ambulation/Gait                 Stairs             Wheelchair Mobility    Modified Rankin (Stroke Patients Only)       Balance                             High level balance activites: Other (comment)(unable to  assess)              Cognition Arousal/Alertness: Awake/alert Behavior During Therapy: Flat affect;Agitated Overall Cognitive Status: Within Functional Limits for tasks assessed                                 General Comments: Pt unwilling to fully participate for therapy today- pt unwilling to sit EOB as lunch had come.       Exercises      General Comments        Pertinent Vitals/Pain Pain Assessment: No/denies pain    Home Living                      Prior Function            PT Goals (current goals can now be found in the care plan section) Acute Rehab PT Goals Patient Stated Goal: to be able to transfer better PT Goal Formulation: With patient Time For Goal Achievement: 05/08/18 Potential to Achieve Goals: Fair Progress towards PT goals: Not progressing toward goals - comment(self-limiting)    Frequency    Min 2X/week      PT Plan Current plan remains appropriate    Co-evaluation PT/OT/SLP Co-Evaluation/Treatment: Yes Reason for Co-Treatment: Complexity of the patient's impairments (multi-system involvement);For  patient/therapist safety;To address functional/ADL transfers;Necessary to address cognition/behavior during functional activity PT goals addressed during session: Mobility/safety with mobility;Strengthening/ROM OT goals addressed during session: Other (comment)(transfer and bed mobility)      AM-PAC PT "6 Clicks" Mobility   Outcome Measure  Help needed turning from your back to your side while in a flat bed without using bedrails?: None Help needed moving from lying on your back to sitting on the side of a flat bed without using bedrails?: A Lot Help needed moving to and from a bed to a chair (including a wheelchair)?: Total Help needed standing up from a chair using your arms (e.g., wheelchair or bedside chair)?: Total Help needed to walk in hospital room?: Total Help needed climbing 3-5 steps with a railing? :  Total 6 Click Score: 10    End of Session   Activity Tolerance: Treatment limited secondary to agitation;Other (comment)(pt very self-limiting) Patient left: in bed;with call bell/phone within reach;with bed alarm set Nurse Communication: Mobility status;Need for lift equipment PT Visit Diagnosis: Muscle weakness (generalized) (M62.81);Difficulty in walking, not elsewhere classified (R26.2);Unsteadiness on feet (R26.81)     Time: 5597-4163 PT Time Calculation (min) (ACUTE ONLY): 31 min  Charges:  $Self Care/Home Management: 8-22                     Sherie Don, PT, DPT  Acute Rehabilitation Services Pager [phone] Office [place] 05/02/2018, 2:52 PM

## 2018-05-02 NOTE — Plan of Care (Signed)
  Problem: Education: Goal: Knowledge of General Education information will improve Description Including pain rating scale, medication(s)/side effects and non-pharmacologic comfort measures Outcome: Progressing   Problem: Health Behavior/Discharge Planning: Goal: Ability to manage health-related needs will improve Outcome: Progressing   Problem: Clinical Measurements: Goal: Ability to maintain clinical measurements within normal limits will improve Outcome: Progressing   Problem: Clinical Measurements: Goal: Will remain free from infection Outcome: Progressing   Problem: Clinical Measurements: Goal: Diagnostic test results will improve Outcome: Progressing   Problem: Activity: Goal: Risk for activity intolerance will decrease Outcome: Progressing

## 2018-05-02 NOTE — Progress Notes (Signed)
Case discussed with Dr. Izetta Dakin COVID testing-he recommends the following:  1.Test for COVID 2.  Keep patient on 5 W.-no need for droplet precautions-continue existing contact precautions for ESBL Klebsiella  Will inform nursing staff

## 2018-05-02 NOTE — Progress Notes (Signed)
Occupational Therapy Treatment Patient Details Name: Eddie Davies MRN: [id number] DOB: 12/04/1970 Today's Date: 05/02/2018    History of present illness Patient is a 48 y/o male who presents from [facility] with AMS and acute respiratory failure with hypoxemia; intubated 3/28- 3/30. Recent admission 3/14-3/21 with septic shock from complicated UTI. CXR- RUL infiltrate. PMH includes dm, seizures, htn, esld, esrd ckd stage IV, cognitive delays.    OT comments  Pt with increased agitation about therapy and about not being discharged by this weekend. Pt unwilling to perform more than bed mobility. Music did not assist in mood stabilizing today. Pt provided education on the need to sit upright for feeding tasks instead of lying flat. Pt at baseline for functional status. Pt would benefit from continued OT skilled services. OT to follow acutely and focus on ADL and mobility.     Follow Up Recommendations  SNF;Supervision/Assistance - 24 hour    Equipment Recommendations       Recommendations for Other Services      Precautions / Restrictions Precautions Precautions: Fall Precaution Comments: tear on sarum and scrotum placed foam patches; flex o seal  Restrictions Weight Bearing Restrictions: No       Mobility Bed Mobility Overal bed mobility: Needs Assistance   Rolling: Min guard         General bed mobility comments: Supervision for safety. Increased time required.   Transfers Overall transfer level: Needs assistance               General transfer comment: Pt unwilling to participate     Balance                             High level balance activites: Other (comment)(unable to assess)             ADL either performed or assessed with clinical judgement   ADL Overall ADL's : Needs assistance/impaired                                     Functional mobility during ADLs: +2 for physical assistance(Pt rolling- no mobility  performed) General ADL Comments: Pt requires assist with ADL for UB and LB. Pt unwilling to participate in additional ADL tasks     Vision   Vision Assessment?: No apparent visual deficits   Perception     Praxis      Cognition Arousal/Alertness: Awake/alert Behavior During Therapy: Agitated Overall Cognitive Status: Within Functional Limits for tasks assessed                                 General Comments: Pt unwilling to fully participate for therapy today- pt unwilling to sit EOB as lunch had come.         Exercises     Shoulder Instructions       General Comments      Pertinent Vitals/ Pain       Pain Assessment: No/denies pain  Home Living                                          Prior Functioning/Environment              Frequency  Min 2X/week        Progress Toward Goals  OT Goals(current goals can now be found in the care plan section)  Progress towards OT goals: Progressing toward goals  Acute Rehab OT Goals Patient Stated Goal: to be able to transfer better OT Goal Formulation: Patient unable to participate in goal setting Time For Goal Achievement: 05/09/18 Potential to Achieve Goals: Good ADL Goals Pt Will Perform Grooming: with modified independence Pt Will Perform Upper Body Dressing: with set-up;sitting Pt Will Perform Lower Body Dressing: with mod assist;sit to/from stand Pt Will Transfer to Toilet: with set-up Pt Will Perform Toileting - Clothing Manipulation and hygiene: with mod assist;sit to/from stand Pt/caregiver will Perform Home Exercise Program: Both right and left upper extremity;With written HEP provided;Independently Additional ADL Goal #1: Pt will perform ADl functional transfers with mod +1 in prep for ADL.  Plan Discharge plan remains appropriate    Co-evaluation    PT/OT/SLP Co-Evaluation/Treatment: Yes Reason for Co-Treatment: Complexity of the patient's impairments  (multi-system involvement);To address functional/ADL transfers   OT goals addressed during session: Other (comment)(transfer and bed mobility)      AM-PAC OT "6 Clicks" Daily Activity     Outcome Measure   Help from another person eating meals?: A Little Help from another person taking care of personal grooming?: A Little Help from another person toileting, which includes using toliet, bedpan, or urinal?: Total Help from another person bathing (including washing, rinsing, drying)?: A Lot Help from another person to put on and taking off regular upper body clothing?: A Lot Help from another person to put on and taking off regular lower body clothing?: Total 6 Click Score: 12    End of Session    OT Visit Diagnosis: Unsteadiness on feet (R26.81);Muscle weakness (generalized) (M62.81);Other symptoms and signs involving cognitive function   Activity Tolerance Treatment limited secondary to agitation   Patient Left in bed;with call bell/phone within reach;with bed alarm set   Nurse Communication Mobility status        Time: 3151-7616 OT Time Calculation (min): 34 min  Charges: OT General Charges $OT Visit: 1 Visit OT Treatments $Therapeutic Activity: 8-22 mins  Darryl Nestle) Marsa Aris OTR/L Acute Rehabilitation Services Pager: [phone] Office: [phone]    Fredda Hammed 05/02/2018, 2:46 PM

## 2018-05-02 NOTE — Progress Notes (Signed)
PROGRESS NOTE        PATIENT DETAILS Name: Eddie Davies Age: 48 y.o. Sex: male Date of Birth: [birth year] Admit Date: 04/21/2018 Admitting Physician Collene Gobble, MD WER:[id number], [facility]  Brief Narrative: Patient is a 48 y.o. male with history of with history of CKD stage 3, presumed liver cirrhosis, hypertension, seizures-transferred from [facility] for altered mental status, acute hypoxic respiratory failure in the setting of ESBL Klebsiella pneumoniae HCAP.  Per H&P, patient was refusing medications for ESLD, and had recently refused HD.  Patient was managed in the intensive care unit, extubated on 3/30-and transferred to the triad hospitalist service.  See below for further details.  Subjective: Awake-alert-no chest pain or shortness of breath.  No coughing-on room air.  Assessment/Plan: Acute metabolic encephalopathy: Resolved-he is completely awake and alert.  Suspect encephalopathy was multifactorial-secondary to hypoxia, pneumonia, worsening renal function.  Acute hypoxic respiratory failure: Intubated for airway protection-some amount of hypoxia is likely secondary to right upper lobe infiltrate.  Sputum culture positive for ESBL Klebsiella pneumonia-has completed a course of meropenem.  He was extubated on 3/30-currently on room air.  Lungs are clear to auscultation.    ESBL Klebsiella pneumonia: Afebrile-lungs are clear-on room air-sputum culture positive for.  Completed a course of meropenem on 4/7.  Per SW-SNF not taking patient back as they want COVID testing is done--very low risk at this point-and improving with current care.  Speaking with my hospitalist director, CMO of [facility] Dr. Hulen Skains and with Dr. Johnnye Sima earlier this morning-recommendations are to pursue COVID-Dr. Johnnye Sima does not recommend any additional isolation including droplet isolation (patient already on contact isolation)-no need to move patient to  2west.  Have updated  charge nurse.  I have reached out to hospitalist director at this point-we will await further guidance-but at this point do not think there is no indication for any further testing.  AKI on CKD stage III: Likely hemodynamically mediated in the setting of hypotension, UTI-renal function improving-renal function has improved with supportive care-creatinine now close to usual baseline.   Uncontrolled DM-2 with hyperglycemia: CBGs stable-continue Lantus 15 units twice daily and SSI.    Anemia: Likely secondary to acute illness on top of anemia of CKD.  No evidence of bleeding-follow hemoglobin periodically.  Has been transfused 1 unit of PRBC on 3/30.  Follow periodically  Hypokalemia: Repleted.  Recheck periodically.  Presumed liver cirrhosis: Appears to be well compensated at present-he is awake and alert with no evidence of encephalopathy-remains on rifaximin and lactulose.  Seizure disorder: Stable-continue phenobarbital and Tegretol.  Hypertension: BP controlled-continue monitoring without the use of any antihypertensives  BPH: Continue Flomax  Cognitive delay/deconditioning: Mostly wheelchair bound-has some amount of cognitive delay-resident of SNF   DVT Prophylaxis:  Heparin   Code Status: Full code  Family Communication: None at bedside  Disposition Plan: Remain inpatient-SNF once COVID testing is completed  Antimicrobial agents: Anti-infectives (From admission, onward)   Start     Dose/Rate Route Frequency Ordered Stop   04/28/18 1400  meropenem (MERREM) 1 g in sodium chloride 0.9 % 100 mL IVPB  Status:  Discontinued     1 g 200 mL/hr over 30 Minutes Intravenous Every 8 hours 04/28/18 1036 05/01/18 0948   04/25/18 1800  meropenem (MERREM) 1 g in sodium chloride 0.9 % 100 mL IVPB  Status:  Discontinued     1 g 200 mL/hr over 30 Minutes Intravenous Every 12 hours 04/25/18 1641 04/28/18 1036   04/24/18 2200  rifaximin (XIFAXAN) tablet 550 mg     550  mg Oral 2 times daily 04/24/18 1810     04/24/18 1200  meropenem (MERREM) 500 mg in sodium chloride 0.9 % 100 mL IVPB  Status:  Discontinued     500 mg 200 mL/hr over 30 Minutes Intravenous Every 24 hours 04/24/18 1044 04/25/18 1641   04/21/18 2359  ceFEPIme (MAXIPIME) 1 g in sodium chloride 0.9 % 100 mL IVPB  Status:  Discontinued     1 g 200 mL/hr over 30 Minutes Intravenous Daily at bedtime 04/21/18 2247 04/24/18 1044   04/21/18 2359  vancomycin (VANCOCIN) 2,000 mg in sodium chloride 0.9 % 500 mL IVPB     2,000 mg 250 mL/hr over 120 Minutes Intravenous  Once 04/21/18 2316 04/22/18 0305   04/21/18 2319  vancomycin variable dose per unstable renal function (pharmacist dosing)  Status:  Discontinued      Does not apply See admin instructions 04/21/18 2319 04/24/18 1044   04/21/18 2230  rifaximin (XIFAXAN) tablet 550 mg  Status:  Discontinued     550 mg Per Tube 2 times daily 04/21/18 2223 04/24/18 1810      Procedures: None  CONSULTS: PCCM Renal  Time spent: 25 minutes-Greater than 50% of this time was spent in counseling, explanation of diagnosis, planning of further management, and coordination of care.  MEDICATIONS: Scheduled Meds:  calcium acetate  1,334 mg Oral TID WC   carbamazepine  600 mg Oral BID   chlorhexidine gluconate (MEDLINE KIT)  15 mL Mouth Rinse BID   feeding supplement (NEPRO CARB STEADY)  237 mL Oral BID BM   heparin injection (subcutaneous)  5,000 Units Subcutaneous Q8H   hydrocerin   Topical BID   insulin aspart  0-15 Units Subcutaneous TID WC   insulin glargine  15 Units Subcutaneous BID   lactulose  30 g Oral TID   mouth rinse  15 mL Mouth Rinse QID   multivitamin  1 tablet Oral QHS   phenobarbital  97.2 mg Oral QHS   rifaximin  550 mg Oral BID   senna-docusate  1 tablet Oral BID   sodium bicarbonate  1,300 mg Oral TID   sodium chloride flush  10-40 mL Intracatheter Q12H   tamsulosin  0.4 mg Oral QHS   Continuous Infusions:   sodium chloride 250 mL (04/29/18 0551)   PRN Meds:.sodium chloride, sodium chloride flush   PHYSICAL EXAM: Vital signs: Vitals:   05/01/18 1519 05/01/18 2224 05/02/18 0434 05/02/18 0437  BP: (!) 151/78 (!) 172/67  (!) 142/63  Pulse: 65 64  72  Resp: 18 16  16   Temp: 97.9 F (36.6 C) 98.3 F (36.8 C)  98.3 F (36.8 C)  TempSrc: Oral Oral  Oral  SpO2: 98% 100%  99%  Weight:   122.3 kg   Height:       Filed Weights   04/28/18 0535 05/01/18 0510 05/02/18 0434  Weight: 131.7 kg 131.7 kg 122.3 kg   Body mass index is 36.57 kg/m.   General appearance:Awake, alert, not in any distress.  Eyes:no scleral icterus. HEENT: Atraumatic and Normocephalic Neck: supple, no JVD. Resp:Good air entry bilaterally,no rales or rhonchi heard anteriorly CVS: S1 S2 regular, no murmurs.  GI: Bowel sounds present, Non tender and not distended with no gaurding, rigidity or rebound. Extremities: B/L Lower Ext shows  no edema, both legs are warm to touch Neurology:  Non focal Musculoskeletal:No digital cyanosis Skin:No Rash, warm and dry Wounds:N/A  I have personally reviewed following labs and imaging studies  LABORATORY DATA: CBC: Recent Labs  Lab 04/27/18 0925 04/27/18 1048  WBC 3.0* 2.8*  HGB 8.2* 8.2*  HCT 26.4* 25.7*  MCV 93.6 93.1  PLT 124* 121*    Basic Metabolic Panel: Recent Labs  Lab 04/26/18 0316 04/27/18 0925 04/28/18 0315 04/29/18 0516 04/30/18 0345  NA 136 143 143 142 140  K 2.8* 3.4* 3.3* 3.7 3.9  CL 110 118* 117* 116* 116*  CO2 18* 14* 17* 17* 17*  GLUCOSE 397* 165* 168* 107* 187*  BUN 45* 46* 45* 41* 39*  CREATININE 3.26* 2.65* 2.21* 2.02* 1.87*  CALCIUM 7.3* 7.6* 7.4* 7.7* 7.8*  MG  --  2.0 1.9  --   --   PHOS 8.3* 7.3*  --   --   --     GFR: Estimated Creatinine Clearance: 66 mL/min (A) (by C-G formula based on SCr of 1.87 mg/dL (H)).  Liver Function Tests: Recent Labs  Lab 04/26/18 0316 04/27/18 0925  ALBUMIN 2.1* 2.4*   No results for  input(s): LIPASE, AMYLASE in the last 168 hours. No results for input(s): AMMONIA in the last 168 hours.  Coagulation Profile: No results for input(s): INR, PROTIME in the last 168 hours.  Cardiac Enzymes: No results for input(s): CKTOTAL, CKMB, CKMBINDEX, TROPONINI in the last 168 hours.  BNP (last 3 results) No results for input(s): PROBNP in the last 8760 hours.  HbA1C: No results for input(s): HGBA1C in the last 72 hours.  CBG: Recent Labs  Lab 04/30/18 2051 05/01/18 0745 05/01/18 1153 05/01/18 2224 05/02/18 0747  GLUCAP 269* 74 117* 279* 137*    Lipid Profile: No results for input(s): CHOL, HDL, LDLCALC, TRIG, CHOLHDL, LDLDIRECT in the last 72 hours.  Thyroid Function Tests: No results for input(s): TSH, T4TOTAL, FREET4, T3FREE, THYROIDAB in the last 72 hours.  Anemia Panel: No results for input(s): VITAMINB12, FOLATE, FERRITIN, TIBC, IRON, RETICCTPCT in the last 72 hours.  Urine analysis:    Component Value Date/Time   COLORURINE AMBER (A) 04/08/2018 1306   APPEARANCEUR CLOUDY (A) 04/08/2018 1306   LABSPEC 1.018 04/08/2018 1306   PHURINE 5.0 04/08/2018 1306   GLUCOSEU NEGATIVE 04/08/2018 1306   HGBUR MODERATE (A) 04/08/2018 1306   BILIRUBINUR NEGATIVE 04/08/2018 1306   KETONESUR NEGATIVE 04/08/2018 1306   PROTEINUR 100 (A) 04/08/2018 1306   NITRITE NEGATIVE 04/08/2018 1306   LEUKOCYTESUR LARGE (A) 04/08/2018 1306    Sepsis Labs: Lactic Acid, Venous    Component Value Date/Time   LATICACIDVEN 0.9 04/22/2018 0419    MICROBIOLOGY: Recent Results (from the past 240 hour(s))  Culture, sputum-assessment     Status: None   Collection Time: 04/22/18 10:27 AM  Result Value Ref Range Status   Specimen Description TRACHEAL ASPIRATE  Final   Special Requests Normal  Final   Sputum evaluation   Final    THIS SPECIMEN IS ACCEPTABLE FOR SPUTUM CULTURE Performed at [facility], [place] [address], [place], [place] [address]    Report Status 04/22/2018  FINAL  Final  Culture, respiratory     Status: None   Collection Time: 04/22/18 10:27 AM  Result Value Ref Range Status   Specimen Description TRACHEAL ASPIRATE  Final   Special Requests Normal Reflexed from [id number]  Final   Gram Stain   Final    FEW  WBC PRESENT, PREDOMINANTLY PMN MODERATE GRAM NEGATIVE RODS RARE BUDDING YEAST SEEN Performed at [facility], [place] [address], [place], [place] [address]    Culture   Final    ABUNDANT KLEBSIELLA PNEUMONIAE Confirmed Extended Spectrum Beta-Lactamase Producer (ESBL).  In bloodstream infections from ESBL organisms, carbapenems are preferred over piperacillin/tazobactam. They are shown to have a lower risk of mortality.    Report Status 04/24/2018 FINAL  Final   Organism ID, Bacteria KLEBSIELLA PNEUMONIAE  Final      Susceptibility   Klebsiella pneumoniae - MIC*    AMPICILLIN >=32 RESISTANT Resistant     CEFAZOLIN >=64 RESISTANT Resistant     CEFEPIME RESISTANT Resistant     CEFTAZIDIME RESISTANT Resistant     CEFTRIAXONE RESISTANT Resistant     CIPROFLOXACIN 1 SENSITIVE Sensitive     GENTAMICIN 8 INTERMEDIATE Intermediate     IMIPENEM <=0.25 SENSITIVE Sensitive     TRIMETH/SULFA >=320 RESISTANT Resistant     AMPICILLIN/SULBACTAM 8 SENSITIVE Sensitive     PIP/TAZO <=4 SENSITIVE Sensitive     Extended ESBL POSITIVE Resistant     * ABUNDANT KLEBSIELLA PNEUMONIAE  Culture, Urine     Status: Abnormal   Collection Time: 04/23/18 11:10 AM  Result Value Ref Range Status   Specimen Description URINE, CATHETERIZED  Final   Special Requests   Final    NONE Performed at [facility], [place] [address], [place], [place] [address]    Culture (A)  Final    10,000 COLONIES/mL HAFNIA ALVEI 10,000 COLONIES/mL YEAST    Report Status 04/25/2018 FINAL  Final   Organism ID, Bacteria HAFNIA ALVEI (A)  Final      Susceptibility   Hafnia alvei - MIC*    AMPICILLIN >=32 RESISTANT Resistant     CEFAZOLIN >=64 RESISTANT Resistant      CEFTRIAXONE 32 INTERMEDIATE Intermediate     CIPROFLOXACIN <=0.25 SENSITIVE Sensitive     GENTAMICIN <=1 SENSITIVE Sensitive     IMIPENEM <=0.25 SENSITIVE Sensitive     NITROFURANTOIN <=16 SENSITIVE Sensitive     TRIMETH/SULFA <=20 SENSITIVE Sensitive     AMPICILLIN/SULBACTAM >=32 RESISTANT Resistant     PIP/TAZO >=128 RESISTANT Resistant     * 10,000 COLONIES/mL HAFNIA ALVEI    RADIOLOGY STUDIES/RESULTS: Dg Chest 1 View  Result Date: 04/08/2018 CLINICAL DATA:  Hemodialysis catheter insertion. EXAM: CHEST  1 VIEW COMPARISON:  04/08/2018 FINDINGS: There is a right IJ central venous catheter in the mid SVC. The catheter tip is long lateral wall near the brachiocephalic SVC junction. The endotracheal tube is in good position at the mid tracheal level. There is an NG tube coursing down the esophagus and into the stomach. Stable cardiac enlargement and prominent mediastinum. Persistent pulmonary edema and pleural effusions. Slight improved lung aeration when compared to earlier film. IMPRESSION: 1. Left IJ central venous catheter tip in the SVC near the brachiocephalic SVC junction. 2. Right subclavian central venous catheter is stable. 3. Stable ET and NG tubes. 4. Persistent cardiac enlargement, central vascular congestion and pulmonary edema but slight improved aeration since earlier film. 5. Small effusions. Electronically Signed   By: Marijo Sanes M.D.   On: 04/08/2018 13:11   Dg Abd 1 View  Result Date: 04/22/2018 CLINICAL DATA:  OG tube placement. EXAM: ABDOMEN - 1 VIEW COMPARISON:  None. FINDINGS: Tip and side port of the enteric tube below the diaphragm in the stomach. Single prominent bowel loop in the upper abdomen is likely transverse colon.  Cholecystectomy clips. IMPRESSION: Tip and side port of the enteric tube below the diaphragm in the stomach. Electronically Signed   By: Keith Rake M.D.   On: 04/22/2018 00:06   US Renal  Result Date: 04/07/2018 CLINICAL DATA:  Acute renal  failure. EXAM: RENAL / URINARY TRACT ULTRASOUND COMPLETE COMPARISON:  Yesterday. FINDINGS: Right Kidney: Renal measurements: 13.2 x 6.8 x 6 0 cm = volume: 281 mL . Echogenicity within normal limits. No mass or hydronephrosis visualized. Left Kidney: Renal measurements: 13.2 x 6 0 x 5.2 cm = volume: 215 mL. Echogenicity within normal limits. No mass or hydronephrosis visualized. Bladder: Not visualized with a Foley catheter in place. IMPRESSION: 1. Normal kidneys without hydronephrosis. 2. Nonvisualized urinary bladder with a Foley catheter in place. Electronically Signed   By: Claudie Revering M.D.   On: 04/07/2018 19:38   Dg Chest Port 1 View  Result Date: 04/23/2018 CLINICAL DATA:  Intubated patient admitted 04/21/2018 with altered mental status. EXAM: PORTABLE CHEST 1 VIEW COMPARISON:  Single-view of the chest 04/21/2018 and 04/12/2018. FINDINGS: Right IJ catheter tip projects in the right subclavian vein, unchanged. NG tube courses into the stomach and below the inferior margin the film. Endotracheal tube tip projects at the thoracic inlet and should be advanced approximately 3 cm. Cardiomegaly is again seen. Bilateral airspace disease persists but aeration in the right upper lung zone has improved. IMPRESSION: ETT tip projects at the thoracic inlet. The tube should be advanced approximately 3 cm. Bilateral airspace disease persist but aeration in the right upper lobe is markedly improved. Electronically Signed   By: Inge Rise M.D.   On: 04/23/2018 07:51   Dg Chest Port 1 View  Result Date: 04/21/2018 CLINICAL DATA:  Respiratory failure EXAM: PORTABLE CHEST 1 VIEW COMPARISON:  04/21/2018 FINDINGS: Interval placement of esophagogastric tube, tip and side-port appear to be below the diaphragm although the lower portion of the examination is very underpenetrated, limiting visualization. Slight interval improvement in aeration of the right upper lobe. Otherwise unchanged examination with endotracheal tube  projecting below the thoracic inlet, right neck vascular catheter, and cardiomegaly. IMPRESSION: Interval placement of esophagogastric tube, tip and side-port appear to be below the diaphragm although the lower portion of the examination is very underpenetrated, limiting visualization. Slight interval improvement in aeration of the right upper lobe. Otherwise unchanged examination with endotracheal tube projecting below the thoracic inlet, right neck vascular catheter, and cardiomegaly. Electronically Signed   By: Eddie Candle M.D.   On: 04/21/2018 23:01   Dg Chest Port 1 View  Result Date: 04/12/2018 CLINICAL DATA:  Abnormal respirations. EXAM: PORTABLE CHEST 1 VIEW COMPARISON:  04/10/2018. FINDINGS: Interim extubation and removal of NG tube. Left IJ line noted with tip over superior vena cava. Cardiomegaly. Diffuse bilateral pulmonary infiltrates/edema. Improved aeration right upper lung. No pleural effusion or pneumothorax. IMPRESSION: 1. Interim extubation and removal of NG tube. Left IJ line stable position. 2.  Cardiomegaly, no interim change. 3. Diffuse bilateral pulmonary infiltrates/edema noted. Improved aeration right upper lung. Electronically Signed   By: Marcello Moores  Register   On: 04/12/2018 06:19   Dg Chest Port 1 View  Result Date: 04/10/2018 CLINICAL DATA:  Hypoxia EXAM: PORTABLE CHEST 1 VIEW COMPARISON:  April 09, 2018 FINDINGS: Endotracheal tube tip is 7.5 cm above the carina. Central catheter tip is at the junction of the superior vena cava and left innominate vein. Nasogastric tube tip and side port are below the diaphragm. No pneumothorax. : There is patchy atelectasis in  the left lower lobe and right upper lobe regions. There is no appreciable edema or consolidation. Heart is mildly enlarged with pulmonary vascularity normal. No adenopathy. No bone lesions. IMPRESSION: Tube and catheter positions as described without pneumothorax. Areas of patchy atelectasis bilaterally. No frank  consolidation or edema. Stable cardiac enlargement. Electronically Signed   By: Lowella Grip III M.D.   On: 04/10/2018 07:16   Dg Chest Port 1 View  Result Date: 04/09/2018 CLINICAL DATA:  Shortness of breath EXAM: PORTABLE CHEST 1 VIEW COMPARISON:  04/08/2018 FINDINGS: Endotracheal tube tip is at the level of the clavicular heads. Left IJ central venous catheter tip is in the mid SVC. Orogastric tube tip is beyond the field of view. There is moderate cardiomegaly with mild pulmonary edema and small pleural effusions. Right subclavian approach central venous catheter is unchanged. IMPRESSION: Cardiomegaly, small pleural effusions and mild pulmonary edema. Unchanged support apparatus. Electronically Signed   By: Ulyses Jarred M.D.   On: 04/09/2018 06:18   Dg Chest Port 1 View  Result Date: 04/08/2018 CLINICAL DATA:  Respiratory failure EXAM: PORTABLE CHEST 1 VIEW COMPARISON:  04/07/2018 FINDINGS: Support Apparatus: --Endotracheal tube: Tip at the level of the clavicular heads. --Enteric tube:Tip and sideport are below the field of view. --Catheter(s):Right subclavian vein approach central venous catheter tip is at the lower SVC --Other: None Unchanged cardiomegaly and mild pulmonary edema. Failing pleural effusions are also unchanged. IMPRESSION: Unchanged appearance of the chest with findings of congestive heart failure. Unchanged support apparatus. Electronically Signed   By: Ulyses Jarred M.D.   On: 04/08/2018 05:17   Dg Chest Port 1 View  Result Date: 04/07/2018 CLINICAL DATA:  Acute respiratory failure. EXAM: PORTABLE CHEST 1 VIEW COMPARISON:  Earlier today. FINDINGS: Endotracheal tube in satisfactory position. Stable right central venous catheter. Nasogastric tube extending into the stomach. Progressive enlargement of the cardiac silhouette. Diffuse prominence of the pulmonary vasculature and interstitial markings with less bilateral airspace opacity with an improved inspiration. Unremarkable  bones. IMPRESSION: Mildly progressive cardiomegaly with grossly stable changes of congestive heart failure. Electronically Signed   By: Claudie Revering M.D.   On: 04/07/2018 19:42     LOS: 11 days   Oren Binet, MD  [facility]  If 7PM-7AM, please contact night-coverage  Please page via [email or web address]  Go to [email or web address] and use [facility]'s universal password to access. If you do not have the password, please contact the hospital operator.  Locate the [facility] provider you are looking for under [facility] and page to a number that you can be directly reached. If you still have difficulty reaching the provider, please page the [facility] (Director on Call) for the Hospitalists listed on amion for assistance.  05/02/2018, 8:39 AM

## 2018-05-03 LAB — BASIC METABOLIC PANEL
Anion gap: 9 (ref 5–15)
BUN: 43 mg/dL — ABNORMAL HIGH (ref 6–20)
CO2: 15 mmol/L — ABNORMAL LOW (ref 22–32)
Calcium: 8.1 mg/dL — ABNORMAL LOW (ref 8.9–10.3)
Chloride: 119 mmol/L — ABNORMAL HIGH (ref 98–111)
Creatinine, Ser: 1.86 mg/dL — ABNORMAL HIGH (ref 0.61–1.24)
GFR calc Af Amer: 49 mL/min — ABNORMAL LOW (ref >60)
GFR calc non Af Amer: 42 mL/min — ABNORMAL LOW (ref >60)
Glucose, Bld: 232 mg/dL — ABNORMAL HIGH (ref 70–99)
Potassium: 3.9 mmol/L (ref 3.5–5.1)
Sodium: 143 mmol/L (ref 135–145)

## 2018-05-03 LAB — GLUCOSE, CAPILLARY
Glucose-Capillary: 159 mg/dL — ABNORMAL HIGH (ref 70–99)
Glucose-Capillary: 159 mg/dL — ABNORMAL HIGH (ref 70–99)
Glucose-Capillary: 213 mg/dL — ABNORMAL HIGH (ref 70–99)
Glucose-Capillary: 225 mg/dL — ABNORMAL HIGH (ref 70–99)

## 2018-05-03 LAB — CBC
HCT: 29.5 % — ABNORMAL LOW (ref 39.0–52.0)
Hemoglobin: 8.8 g/dL — ABNORMAL LOW (ref 13.0–17.0)
MCH: 28.5 pg (ref 26.0–34.0)
MCHC: 29.8 g/dL — ABNORMAL LOW (ref 30.0–36.0)
MCV: 95.5 fL (ref 80.0–100.0)
Platelets: 107 10*3/uL — ABNORMAL LOW (ref 150–400)
RBC: 3.09 MIL/uL — ABNORMAL LOW (ref 4.22–5.81)
RDW: 15.8 % — ABNORMAL HIGH (ref 11.5–15.5)
WBC: 4.4 10*3/uL (ref 4.0–10.5)
nRBC: 0 % (ref 0.0–0.2)

## 2018-05-03 NOTE — Progress Notes (Signed)
PROGRESS NOTE        PATIENT DETAILS Name: Eddie Davies Age: 48 y.o. Sex: male Date of Birth: 05/06/1970 Admit Date: 04/21/2018 Admitting Physician Collene Gobble, MD JAS:[id number], [facility] Family  Brief Narrative: Patient is a 48 y.o. male with history of with history of CKD stage 3, presumed liver cirrhosis, hypertension, seizures-transferred from [facility] for altered mental status, acute hypoxic respiratory failure in the setting of ESBL Klebsiella pneumoniae HCAP.  Per H&P, patient was refusing medications for ESLD, and had recently refused HD.  Patient was managed in the intensive care unit, extubated on 3/30-and transferred to the triad hospitalist service.  See below for further details.  Subjective: Lying comfortably in bed denies any chest pain or shortness of breath.On room air-afebrile-no coughing  Assessment/Plan: Acute metabolic encephalopathy: Resolved-he is completely awake and alert.  Suspect encephalopathy was multifactorial-secondary to hypoxia, pneumonia, worsening renal function.  Acute hypoxic respiratory failure: Intubated for airway protection-some amount of hypoxia is likely secondary to right upper lobe infiltrate.  Sputum culture positive for ESBL Klebsiella pneumonia-has completed a course of meropenem.  He was extubated on 3/30-currently on room air.  Lungs are clear to auscultation.    ESBL Klebsiella pneumonia: Afebrile-lungs are clear-on room air-sputum culture positive for.  Completed a course of meropenem on 4/7.  Per SW-SNF not taking patient back as they want COVID testing is done--very low risk at this point-and improving with current care.  After speaking  with my hospitalist director, CMO of [facility] Dr. Hulen Skains and with Dr. Johnnye Sima on 4/8-recommendations are to pursue COVID-Dr. Johnnye Sima does not recommend any additional isolation including droplet isolation (patient already on contact isolation)-no need  to move patient to 2west.  COVID-19 test pending  AKI on CKD stage III: Likely hemodynamically mediated in the setting of hypotension, UTI-renal function improving-renal function has improved with supportive care-creatinine now close to usual baseline.   Uncontrolled DM-2 with hyperglycemia: CBGs stable-continue Lantus 15 units twice daily and SSI.    Anemia: Likely secondary to acute illness on top of anemia of CKD.  No evidence of bleeding-follow hemoglobin periodically.  Has been transfused 1 unit of PRBC on 3/30.  Follow periodically  Hypokalemia: Repleted.  Recheck periodically.  Presumed liver cirrhosis: Appears to be well compensated at present-he is awake and alert with no evidence of encephalopathy-remains on rifaximin and lactulose.  Seizure disorder: Stable-continue phenobarbital and Tegretol.  Hypertension: BP controlled-continue monitoring without the use of any antihypertensives  BPH: Continue Flomax  Cognitive delay/deconditioning: Mostly wheelchair bound-has some amount of cognitive delay-resident of SNF   DVT Prophylaxis:  Heparin   Code Status: Full code  Family Communication: None at bedside  Disposition Plan: Remain inpatient-SNF once COVID testing is completed  Antimicrobial agents: Anti-infectives (From admission, onward)   Start     Dose/Rate Route Frequency Ordered Stop   04/28/18 1400  meropenem (MERREM) 1 g in sodium chloride 0.9 % 100 mL IVPB  Status:  Discontinued     1 g 200 mL/hr over 30 Minutes Intravenous Every 8 hours 04/28/18 1036 05/01/18 0948   04/25/18 1800  meropenem (MERREM) 1 g in sodium chloride 0.9 % 100 mL IVPB  Status:  Discontinued     1 g 200 mL/hr over 30 Minutes Intravenous Every 12 hours 04/25/18 1641 04/28/18 1036   04/24/18 2200  rifaximin (XIFAXAN)  tablet 550 mg     550 mg Oral 2 times daily 04/24/18 1810     04/24/18 1200  meropenem (MERREM) 500 mg in sodium chloride 0.9 % 100 mL IVPB  Status:  Discontinued     500  mg 200 mL/hr over 30 Minutes Intravenous Every 24 hours 04/24/18 1044 04/25/18 1641   04/21/18 2359  ceFEPIme (MAXIPIME) 1 g in sodium chloride 0.9 % 100 mL IVPB  Status:  Discontinued     1 g 200 mL/hr over 30 Minutes Intravenous Daily at bedtime 04/21/18 2247 04/24/18 1044   04/21/18 2359  vancomycin (VANCOCIN) 2,000 mg in sodium chloride 0.9 % 500 mL IVPB     2,000 mg 250 mL/hr over 120 Minutes Intravenous  Once 04/21/18 2316 04/22/18 0305   04/21/18 2319  vancomycin variable dose per unstable renal function (pharmacist dosing)  Status:  Discontinued      Does not apply See admin instructions 04/21/18 2319 04/24/18 1044   04/21/18 2230  rifaximin (XIFAXAN) tablet 550 mg  Status:  Discontinued     550 mg Per Tube 2 times daily 04/21/18 2223 04/24/18 1810      Procedures: None  CONSULTS: PCCM Renal  Time spent: 15 minutes-Greater than 50% of this time was spent in counseling, explanation of diagnosis, planning of further management, and coordination of care.  MEDICATIONS: Scheduled Meds:  calcium acetate  1,334 mg Oral TID WC   carbamazepine  600 mg Oral BID   chlorhexidine gluconate (MEDLINE KIT)  15 mL Mouth Rinse BID   feeding supplement (NEPRO CARB STEADY)  237 mL Oral BID BM   heparin injection (subcutaneous)  5,000 Units Subcutaneous Q8H   hydrocerin   Topical BID   insulin aspart  0-15 Units Subcutaneous TID WC   insulin glargine  15 Units Subcutaneous BID   lactulose  30 g Oral TID   mouth rinse  15 mL Mouth Rinse QID   multivitamin  1 tablet Oral QHS   phenobarbital  97.2 mg Oral QHS   rifaximin  550 mg Oral BID   senna-docusate  1 tablet Oral BID   sodium bicarbonate  1,300 mg Oral TID   sodium chloride flush  10-40 mL Intracatheter Q12H   tamsulosin  0.4 mg Oral QHS   Continuous Infusions:  sodium chloride 250 mL (04/29/18 0551)   PRN Meds:.sodium chloride, sodium chloride flush   PHYSICAL EXAM: Vital signs: Vitals:   05/02/18 0437  05/02/18 1441 05/02/18 2159 05/03/18 0554  BP: (!) 142/63 128/65 139/82 (!) 114/58  Pulse: 72 70 71 67  Resp: 16 16    Temp: 98.3 F (36.8 C) 98 F (36.7 C) 97.8 F (36.6 C) 98.2 F (36.8 C)  TempSrc: Oral Oral Oral Oral  SpO2: 99% 99% 99% 98%  Weight:      Height:       Filed Weights   04/28/18 0535 05/01/18 0510 05/02/18 0434  Weight: 131.7 kg 131.7 kg 122.3 kg   Body mass index is 36.57 kg/m.   General appearance:Awake, alert, not in any distress.  Eyes:no scleral icterus. HEENT: Atraumatic and Normocephalic Neck: supple, no JVD. Resp:Good air entry bilaterally,no rales or rhonchi heard anteriorly CVS: S1 S2 regular, no murmurs.  GI: Bowel sounds present, Non tender and not distended with no gaurding, rigidity or rebound. Extremities: B/L Lower Ext shows no edema, both legs are warm to touch Neurology:  Non focal Musculoskeletal:No digital cyanosis Skin:No Rash, warm and dry Wounds:N/A  I have personally reviewed  following labs and imaging studies  LABORATORY DATA: CBC: Recent Labs  Lab 04/27/18 0925 04/27/18 1048 05/03/18 0238  WBC 3.0* 2.8* 4.4  HGB 8.2* 8.2* 8.8*  HCT 26.4* 25.7* 29.5*  MCV 93.6 93.1 95.5  PLT 124* 121* 107*    Basic Metabolic Panel: Recent Labs  Lab 04/27/18 0925 04/28/18 0315 04/29/18 0516 04/30/18 0345 05/03/18 0238  NA 143 143 142 140 143  K 3.4* 3.3* 3.7 3.9 3.9  CL 118* 117* 116* 116* 119*  CO2 14* 17* 17* 17* 15*  GLUCOSE 165* 168* 107* 187* 232*  BUN 46* 45* 41* 39* 43*  CREATININE 2.65* 2.21* 2.02* 1.87* 1.86*  CALCIUM 7.6* 7.4* 7.7* 7.8* 8.1*  MG 2.0 1.9  --   --   --   PHOS 7.3*  --   --   --   --     GFR: Estimated Creatinine Clearance: 66.3 mL/min (A) (by C-G formula based on SCr of 1.86 mg/dL (H)).  Liver Function Tests: Recent Labs  Lab 04/27/18 0925  ALBUMIN 2.4*   No results for input(s): LIPASE, AMYLASE in the last 168 hours. No results for input(s): AMMONIA in the last 168 hours.  Coagulation  Profile: No results for input(s): INR, PROTIME in the last 168 hours.  Cardiac Enzymes: No results for input(s): CKTOTAL, CKMB, CKMBINDEX, TROPONINI in the last 168 hours.  BNP (last 3 results) No results for input(s): PROBNP in the last 8760 hours.  HbA1C: No results for input(s): HGBA1C in the last 72 hours.  CBG: Recent Labs  Lab 05/02/18 0747 05/02/18 1156 05/02/18 1634 05/02/18 2159 05/03/18 0757  GLUCAP 137* 235* 265* 262* 159*    Lipid Profile: No results for input(s): CHOL, HDL, LDLCALC, TRIG, CHOLHDL, LDLDIRECT in the last 72 hours.  Thyroid Function Tests: No results for input(s): TSH, T4TOTAL, FREET4, T3FREE, THYROIDAB in the last 72 hours.  Anemia Panel: No results for input(s): VITAMINB12, FOLATE, FERRITIN, TIBC, IRON, RETICCTPCT in the last 72 hours.  Urine analysis:    Component Value Date/Time   COLORURINE AMBER (A) 04/08/2018 1306   APPEARANCEUR CLOUDY (A) 04/08/2018 1306   LABSPEC 1.018 04/08/2018 1306   PHURINE 5.0 04/08/2018 1306   GLUCOSEU NEGATIVE 04/08/2018 1306   HGBUR MODERATE (A) 04/08/2018 1306   BILIRUBINUR NEGATIVE 04/08/2018 1306   KETONESUR NEGATIVE 04/08/2018 1306   PROTEINUR 100 (A) 04/08/2018 1306   NITRITE NEGATIVE 04/08/2018 1306   LEUKOCYTESUR LARGE (A) 04/08/2018 1306    Sepsis Labs: Lactic Acid, Venous    Component Value Date/Time   LATICACIDVEN 0.9 04/22/2018 0419    MICROBIOLOGY: Recent Results (from the past 240 hour(s))  Culture, Urine     Status: Abnormal   Collection Time: 04/23/18 11:10 AM  Result Value Ref Range Status   Specimen Description URINE, CATHETERIZED  Final   Special Requests   Final    NONE Performed at [facility], [place] [address] [place], [place] [address]    Culture (A)  Final    10,000 COLONIES/mL HAFNIA ALVEI 10,000 COLONIES/mL YEAST    Report Status 04/25/2018 FINAL  Final   Organism ID, Bacteria HAFNIA ALVEI (A)  Final      Susceptibility   Hafnia alvei - MIC*     AMPICILLIN >=32 RESISTANT Resistant     CEFAZOLIN >=64 RESISTANT Resistant     CEFTRIAXONE 32 INTERMEDIATE Intermediate     CIPROFLOXACIN <=0.25 SENSITIVE Sensitive     GENTAMICIN <=1 SENSITIVE Sensitive     IMIPENEM <=0.25  SENSITIVE Sensitive     NITROFURANTOIN <=16 SENSITIVE Sensitive     TRIMETH/SULFA <=20 SENSITIVE Sensitive     AMPICILLIN/SULBACTAM >=32 RESISTANT Resistant     PIP/TAZO >=128 RESISTANT Resistant     * 10,000 COLONIES/mL HAFNIA ALVEI    RADIOLOGY STUDIES/RESULTS: Dg Chest 1 View  Result Date: 04/08/2018 CLINICAL DATA:  Hemodialysis catheter insertion. EXAM: CHEST  1 VIEW COMPARISON:  04/08/2018 FINDINGS: There is a right IJ central venous catheter in the mid SVC. The catheter tip is long lateral wall near the brachiocephalic SVC junction. The endotracheal tube is in good position at the mid tracheal level. There is an NG tube coursing down the esophagus and into the stomach. Stable cardiac enlargement and prominent mediastinum. Persistent pulmonary edema and pleural effusions. Slight improved lung aeration when compared to earlier film. IMPRESSION: 1. Left IJ central venous catheter tip in the SVC near the brachiocephalic SVC junction. 2. Right subclavian central venous catheter is stable. 3. Stable ET and NG tubes. 4. Persistent cardiac enlargement, central vascular congestion and pulmonary edema but slight improved aeration since earlier film. 5. Small effusions. Electronically Signed   By: Marijo Sanes M.D.   On: 04/08/2018 13:11   Dg Abd 1 View  Result Date: 04/22/2018 CLINICAL DATA:  OG tube placement. EXAM: ABDOMEN - 1 VIEW COMPARISON:  None. FINDINGS: Tip and side port of the enteric tube below the diaphragm in the stomach. Single prominent bowel loop in the upper abdomen is likely transverse colon. Cholecystectomy clips. IMPRESSION: Tip and side port of the enteric tube below the diaphragm in the stomach. Electronically Signed   By: Keith Rake M.D.   On:  04/22/2018 00:06   US Renal  Result Date: 04/07/2018 CLINICAL DATA:  Acute renal failure. EXAM: RENAL / URINARY TRACT ULTRASOUND COMPLETE COMPARISON:  Yesterday. FINDINGS: Right Kidney: Renal measurements: 13.2 x 6.8 x 6 0 cm = volume: 281 mL . Echogenicity within normal limits. No mass or hydronephrosis visualized. Left Kidney: Renal measurements: 13.2 x 6 0 x 5.2 cm = volume: 215 mL. Echogenicity within normal limits. No mass or hydronephrosis visualized. Bladder: Not visualized with a Foley catheter in place. IMPRESSION: 1. Normal kidneys without hydronephrosis. 2. Nonvisualized urinary bladder with a Foley catheter in place. Electronically Signed   By: Claudie Revering M.D.   On: 04/07/2018 19:38   Dg Chest Port 1 View  Result Date: 04/23/2018 CLINICAL DATA:  Intubated patient admitted 04/21/2018 with altered mental status. EXAM: PORTABLE CHEST 1 VIEW COMPARISON:  Single-view of the chest 04/21/2018 and 04/12/2018. FINDINGS: Right IJ catheter tip projects in the right subclavian vein, unchanged. NG tube courses into the stomach and below the inferior margin the film. Endotracheal tube tip projects at the thoracic inlet and should be advanced approximately 3 cm. Cardiomegaly is again seen. Bilateral airspace disease persists but aeration in the right upper lung zone has improved. IMPRESSION: ETT tip projects at the thoracic inlet. The tube should be advanced approximately 3 cm. Bilateral airspace disease persist but aeration in the right upper lobe is markedly improved. Electronically Signed   By: Inge Rise M.D.   On: 04/23/2018 07:51   Dg Chest Port 1 View  Result Date: 04/21/2018 CLINICAL DATA:  Respiratory failure EXAM: PORTABLE CHEST 1 VIEW COMPARISON:  04/21/2018 FINDINGS: Interval placement of esophagogastric tube, tip and side-port appear to be below the diaphragm although the lower portion of the examination is very underpenetrated, limiting visualization. Slight interval improvement in  aeration of the right upper  lobe. Otherwise unchanged examination with endotracheal tube projecting below the thoracic inlet, right neck vascular catheter, and cardiomegaly. IMPRESSION: Interval placement of esophagogastric tube, tip and side-port appear to be below the diaphragm although the lower portion of the examination is very underpenetrated, limiting visualization. Slight interval improvement in aeration of the right upper lobe. Otherwise unchanged examination with endotracheal tube projecting below the thoracic inlet, right neck vascular catheter, and cardiomegaly. Electronically Signed   By: Eddie Candle M.D.   On: 04/21/2018 23:01   Dg Chest Port 1 View  Result Date: 04/12/2018 CLINICAL DATA:  Abnormal respirations. EXAM: PORTABLE CHEST 1 VIEW COMPARISON:  04/10/2018. FINDINGS: Interim extubation and removal of NG tube. Left IJ line noted with tip over superior vena cava. Cardiomegaly. Diffuse bilateral pulmonary infiltrates/edema. Improved aeration right upper lung. No pleural effusion or pneumothorax. IMPRESSION: 1. Interim extubation and removal of NG tube. Left IJ line stable position. 2.  Cardiomegaly, no interim change. 3. Diffuse bilateral pulmonary infiltrates/edema noted. Improved aeration right upper lung. Electronically Signed   By: Marcello Moores  Register   On: 04/12/2018 06:19   Dg Chest Port 1 View  Result Date: 04/10/2018 CLINICAL DATA:  Hypoxia EXAM: PORTABLE CHEST 1 VIEW COMPARISON:  April 09, 2018 FINDINGS: Endotracheal tube tip is 7.5 cm above the carina. Central catheter tip is at the junction of the superior vena cava and left innominate vein. Nasogastric tube tip and side port are below the diaphragm. No pneumothorax. : There is patchy atelectasis in the left lower lobe and right upper lobe regions. There is no appreciable edema or consolidation. Heart is mildly enlarged with pulmonary vascularity normal. No adenopathy. No bone lesions. IMPRESSION: Tube and catheter positions as  described without pneumothorax. Areas of patchy atelectasis bilaterally. No frank consolidation or edema. Stable cardiac enlargement. Electronically Signed   By: Lowella Grip III M.D.   On: 04/10/2018 07:16   Dg Chest Port 1 View  Result Date: 04/09/2018 CLINICAL DATA:  Shortness of breath EXAM: PORTABLE CHEST 1 VIEW COMPARISON:  04/08/2018 FINDINGS: Endotracheal tube tip is at the level of the clavicular heads. Left IJ central venous catheter tip is in the mid SVC. Orogastric tube tip is beyond the field of view. There is moderate cardiomegaly with mild pulmonary edema and small pleural effusions. Right subclavian approach central venous catheter is unchanged. IMPRESSION: Cardiomegaly, small pleural effusions and mild pulmonary edema. Unchanged support apparatus. Electronically Signed   By: Ulyses Jarred M.D.   On: 04/09/2018 06:18   Dg Chest Port 1 View  Result Date: 04/08/2018 CLINICAL DATA:  Respiratory failure EXAM: PORTABLE CHEST 1 VIEW COMPARISON:  04/07/2018 FINDINGS: Support Apparatus: --Endotracheal tube: Tip at the level of the clavicular heads. --Enteric tube:Tip and sideport are below the field of view. --Catheter(s):Right subclavian vein approach central venous catheter tip is at the lower SVC --Other: None Unchanged cardiomegaly and mild pulmonary edema. Failing pleural effusions are also unchanged. IMPRESSION: Unchanged appearance of the chest with findings of congestive heart failure. Unchanged support apparatus. Electronically Signed   By: Ulyses Jarred M.D.   On: 04/08/2018 05:17   Dg Chest Port 1 View  Result Date: 04/07/2018 CLINICAL DATA:  Acute respiratory failure. EXAM: PORTABLE CHEST 1 VIEW COMPARISON:  Earlier today. FINDINGS: Endotracheal tube in satisfactory position. Stable right central venous catheter. Nasogastric tube extending into the stomach. Progressive enlargement of the cardiac silhouette. Diffuse prominence of the pulmonary vasculature and interstitial markings  with less bilateral airspace opacity with an improved inspiration. Unremarkable bones. IMPRESSION:  Mildly progressive cardiomegaly with grossly stable changes of congestive heart failure. Electronically Signed   By: Claudie Revering M.D.   On: 04/07/2018 19:42     LOS: 12 days   Oren Binet, MD  [facility]  If 7PM-7AM, please contact night-coverage  Please page via [email or web address]  Go to [email or web address] and use [place]'s universal password to access. If you do not have the password, please contact the hospital operator.  Locate the [facility] provider you are looking for under [facility] and page to a number that you can be directly reached. If you still have difficulty reaching the provider, please page the [facility] (Director on Call) for the Hospitalists listed on amion for assistance.  05/03/2018, 9:12 AM

## 2018-05-04 ENCOUNTER — Encounter (HOSPITAL_COMMUNITY): Payer: Self-pay

## 2018-05-04 LAB — GLUCOSE, CAPILLARY
Glucose-Capillary: 139 mg/dL — ABNORMAL HIGH (ref 70–99)
Glucose-Capillary: 187 mg/dL — ABNORMAL HIGH (ref 70–99)
Glucose-Capillary: 195 mg/dL — ABNORMAL HIGH (ref 70–99)
Glucose-Capillary: 212 mg/dL — ABNORMAL HIGH (ref 70–99)
Glucose-Capillary: 232 mg/dL — ABNORMAL HIGH (ref 70–99)

## 2018-05-04 LAB — NOVEL CORONAVIRUS, NAA (HOSP ORDER, SEND-OUT TO REF LAB; TAT 18-24 HRS): SARS-CoV-2, NAA: NOT DETECTED

## 2018-05-04 NOTE — Progress Notes (Signed)
Physical Therapy Treatment Patient Details Name: Eddie Davies MRN: [id number] DOB: Oct 14, 1970 Today's Date: 05/04/2018    History of Present Illness Patient is a 49 y/o male who presents from [facility] with AMS and acute respiratory failure with hypoxemia; intubated 3/28- 3/30. Recent admission 3/14-3/21 with septic shock from complicated UTI. CXR- RUL infiltrate. PMH includes dm, seizures, htn, esld, esrd ckd stage IV, cognitive delays.     PT Comments    Pt performed supine to sit, sit to supine and rolling during session.  He was agitated on arrival and required max VCs for participation.  Pt lying on side on arrival with copious amounts of stool on bed pad behind him and directly on his skin.  Informed nursing that rectal tube bag was empty and appeared to be faulty.  Based on level of assistance require patient continues to benefit from skilled rehab in a post acute setting.  Plan next session for continued strengthening and progression of mobility to tolerance.     Follow Up Recommendations  SNF;Supervision for mobility/OOB     Equipment Recommendations  Other (comment)(defer to next venue)    Recommendations for Other Services       Precautions / Restrictions Precautions Precautions: Fall Precaution Comments: flexiseal Restrictions Weight Bearing Restrictions: No    Mobility  Bed Mobility Overal bed mobility: Needs Assistance Bed Mobility: Rolling;Sidelying to Sit;Sit to Supine Rolling: Min guard Sidelying to sit: Mod assist;HOB elevated;+2 for physical assistance Supine to sit: Mod assist;+2 for physical assistance Sit to supine: Mod assist;+2 for safety/equipment   General bed mobility comments: Pt required min guard for rolling and moderate assistance to come to sitting and return back to supine.  Pt required max verbal cues for participation.    Transfers                 General transfer comment: despite max encouragement pt adamantly refusing to  perform any transfers.  Ambulation/Gait                 Stairs             Wheelchair Mobility    Modified Rankin (Stroke Patients Only)       Balance Overall balance assessment: Needs assistance Sitting-balance support: Feet supported;No upper extremity supported Sitting balance-Leahy Scale: Fair Sitting balance - Comments: Able to tolerate sitting EOB unsupported, moderate assistance to scoot to HOB in sitting.                                      Cognition Arousal/Alertness: Awake/alert Behavior During Therapy: Flat affect;Agitated Overall Cognitive Status: Within Functional Limits for tasks assessed                                        Exercises General Exercises - Lower Extremity Ankle Circles/Pumps: 10 reps;Left;Seated    General Comments        Pertinent Vitals/Pain Pain Assessment: 0-10 Pain Score: 7  Faces Pain Scale: Hurts little more Pain Location: L ankle Pain Descriptors / Indicators: Aching Pain Intervention(s): Monitored during session;Repositioned;Limited activity within patient's tolerance    Home Living                      Prior Function  PT Goals (current goals can now be found in the care plan section) Acute Rehab PT Goals Patient Stated Goal: to be able to transfer better Potential to Achieve Goals: Fair Additional Goals Additional Goal #1: Pt will propel w/c 150' independently prior to discharge. Progress towards PT goals: Progressing toward goals    Frequency    Min 2X/week      PT Plan Current plan remains appropriate    Co-evaluation PT/OT/SLP Co-Evaluation/Treatment: Yes Reason for Co-Treatment: Complexity of the patient's impairments (multi-system involvement);Necessary to address cognition/behavior during functional activity;To address functional/ADL transfers(agitated) PT goals addressed during session: Mobility/safety with mobility OT goals addressed  during session: ADL's and self-care      AM-PAC PT "6 Clicks" Mobility   Outcome Measure  Help needed turning from your back to your side while in a flat bed without using bedrails?: A Little Help needed moving from lying on your back to sitting on the side of a flat bed without using bedrails?: A Lot Help needed moving to and from a bed to a chair (including a wheelchair)?: Total Help needed standing up from a chair using your arms (e.g., wheelchair or bedside chair)?: Total Help needed to walk in hospital room?: Total Help needed climbing 3-5 steps with a railing? : Total 6 Click Score: 9    End of Session   Activity Tolerance: Treatment limited secondary to agitation;Other (comment)(Pt remains very self limiting) Patient left: in bed;with call bell/phone within reach;with bed alarm set Nurse Communication: Mobility status;Need for lift equipment PT Visit Diagnosis: Muscle weakness (generalized) (M62.81);Difficulty in walking, not elsewhere classified (R26.2);Unsteadiness on feet (R26.81)     Time: 3200-3794 PT Time Calculation (min) (ACUTE ONLY): 29 min  Charges:  $Therapeutic Activity: 8-22 mins                     Eddie Davies, PTA Acute Rehabilitation Services Pager [phone] Office [phone]     Eddie Davies 05/04/2018, 2:06 PM

## 2018-05-04 NOTE — Progress Notes (Signed)
PROGRESS NOTE        PATIENT DETAILS Name: Eddie Davies Age: 48 y.o. Sex: male Date of Birth: 1970/08/13 Admit Date: 04/21/2018 Admitting Physician Collene Gobble, MD [id number], [facility]  Brief Narrative: Patient is a 48 y.o. male with history of with history of CKD stage 3, presumed liver cirrhosis, hypertension, seizures-transferred from [facility] for altered mental status, acute hypoxic respiratory failure in the setting of ESBL Klebsiella pneumoniae HCAP.  Per H&P, patient was refusing medications for ESLD, and had recently refused HD.  Patient was managed in the intensive care unit, extubated on 3/30-and transferred to the triad hospitalist service.  See below for further details.  Subjective: Lying comfortably in bed-anxious to leave the hospital-awaiting covid results.  Denies any chest pain or shortness of breath.  Assessment/Plan: Acute metabolic encephalopathy: Resolved-he is completely awake and alert.  Suspect encephalopathy was multifactorial-secondary to hypoxia, pneumonia, worsening renal function.  Acute hypoxic respiratory failure: Intubated for airway protection-some amount of hypoxia is likely secondary to right upper lobe infiltrate.  Sputum culture positive for ESBL Klebsiella pneumonia-has completed a course of meropenem.  He was extubated on 3/30-currently on room air.  Lungs are clear to auscultation.    ESBL Klebsiella pneumonia: Afebrile-lungs are clear-on room air-sputum culture positive for.  Completed a course of meropenem on 4/7.  Per SW-SNF not taking patient back as they want COVID testing is done--very low risk at this point-and improving with current care.  After speaking  with my hospitalist director, CMO of [facility] Dr. Hulen Skains and with Dr. Johnnye Sima on 4/8-recommendations are to pursue COVID-Dr. Johnnye Sima does not recommend any additional isolation including droplet isolation (patient already on  contact isolation for ESBL)-no need to move patient to 2west.  COVID-19 test pending  AKI on CKD stage III: Likely hemodynamically mediated in the setting of hypotension, UTI-renal function improving-renal function has improved with supportive care-creatinine now close to usual baseline.  No further recommendations from nephrology.  Uncontrolled DM-2 with hyperglycemia: CBGs stable-continue Lantus 15 units twice daily and SSI.    Anemia: Likely secondary to acute illness on top of anemia of CKD.  No evidence of bleeding-follow hemoglobin periodically.  Has been transfused 1 unit of PRBC on 3/30.  Follow periodically  Hypokalemia: Repleted.  Recheck periodically.  Presumed liver cirrhosis: Appears to be well compensated at present-he is awake and alert with no evidence of encephalopathy-remains on rifaximin and lactulose.  Seizure disorder: Stable-continue phenobarbital and Tegretol.  Hypertension: BP controlled-continue monitoring without the use of any antihypertensives  BPH: Continue Flomax  Cognitive delay/deconditioning: Mostly wheelchair bound-has some amount of cognitive delay-resident of SNF   DVT Prophylaxis:  Heparin   Code Status: Full code  Family Communication: None at bedside  Disposition Plan: Remain inpatient-SNF once COVID testing is completed  Antimicrobial agents: Anti-infectives (From admission, onward)   Start     Dose/Rate Route Frequency Ordered Stop   04/28/18 1400  meropenem (MERREM) 1 g in sodium chloride 0.9 % 100 mL IVPB  Status:  Discontinued     1 g 200 mL/hr over 30 Minutes Intravenous Every 8 hours 04/28/18 1036 05/01/18 0948   04/25/18 1800  meropenem (MERREM) 1 g in sodium chloride 0.9 % 100 mL IVPB  Status:  Discontinued     1 g 200 mL/hr over 30 Minutes Intravenous Every 12 hours  04/25/18 1641 04/28/18 1036   04/24/18 2200  rifaximin (XIFAXAN) tablet 550 mg     550 mg Oral 2 times daily 04/24/18 1810     04/24/18 1200  meropenem (MERREM)  500 mg in sodium chloride 0.9 % 100 mL IVPB  Status:  Discontinued     500 mg 200 mL/hr over 30 Minutes Intravenous Every 24 hours 04/24/18 1044 04/25/18 1641   04/21/18 2359  ceFEPIme (MAXIPIME) 1 g in sodium chloride 0.9 % 100 mL IVPB  Status:  Discontinued     1 g 200 mL/hr over 30 Minutes Intravenous Daily at bedtime 04/21/18 2247 04/24/18 1044   04/21/18 2359  vancomycin (VANCOCIN) 2,000 mg in sodium chloride 0.9 % 500 mL IVPB     2,000 mg 250 mL/hr over 120 Minutes Intravenous  Once 04/21/18 2316 04/22/18 0305   04/21/18 2319  vancomycin variable dose per unstable renal function (pharmacist dosing)  Status:  Discontinued      Does not apply See admin instructions 04/21/18 2319 04/24/18 1044   04/21/18 2230  rifaximin (XIFAXAN) tablet 550 mg  Status:  Discontinued     550 mg Per Tube 2 times daily 04/21/18 2223 04/24/18 1810      Procedures: None  CONSULTS: PCCM Renal  Time spent: 15 minutes-Greater than 50% of this time was spent in counseling, explanation of diagnosis, planning of further management, and coordination of care.  MEDICATIONS: Scheduled Meds:  calcium acetate  1,334 mg Oral TID WC   carbamazepine  600 mg Oral BID   chlorhexidine gluconate (MEDLINE KIT)  15 mL Mouth Rinse BID   feeding supplement (NEPRO CARB STEADY)  237 mL Oral BID BM   heparin injection (subcutaneous)  5,000 Units Subcutaneous Q8H   hydrocerin   Topical BID   insulin aspart  0-15 Units Subcutaneous TID WC   insulin glargine  15 Units Subcutaneous BID   lactulose  30 g Oral TID   mouth rinse  15 mL Mouth Rinse QID   multivitamin  1 tablet Oral QHS   phenobarbital  97.2 mg Oral QHS   rifaximin  550 mg Oral BID   senna-docusate  1 tablet Oral BID   sodium bicarbonate  1,300 mg Oral TID   sodium chloride flush  10-40 mL Intracatheter Q12H   tamsulosin  0.4 mg Oral QHS   Continuous Infusions:  sodium chloride 250 mL (04/29/18 0551)   PRN Meds:.sodium chloride, sodium  chloride flush   PHYSICAL EXAM: Vital signs: Vitals:   05/02/18 2159 05/03/18 0554 05/03/18 2208 05/04/18 0515  BP: 139/82 (!) 114/58 117/81 125/83  Pulse: 71 67 67 73  Resp:      Temp: 97.8 F (36.6 C) 98.2 F (36.8 C) 98.4 F (36.9 C) 98.8 F (37.1 C)  TempSrc: Oral Oral Oral Oral  SpO2: 99% 98% 100% 99%  Weight:      Height:       Filed Weights   04/28/18 0535 05/01/18 0510 05/02/18 0434  Weight: 131.7 kg 131.7 kg 122.3 kg   Body mass index is 36.57 kg/m.   General appearance:Awake, alert, not in any distress.  Eyes:no scleral icterus. HEENT: Atraumatic and Normocephalic Neck: supple, no JVD. Resp:Good air entry bilaterally,no rales or rhonchi heard anteriorly CVS: S1 S2 regular, no murmurs.  GI: Bowel sounds present, Non tender and not distended with no gaurding, rigidity or rebound. Extremities: B/L Lower Ext shows no edema, both legs are warm to touch Neurology:  Non focal Musculoskeletal:No digital cyanosis Skin:No  Rash, warm and dry Wounds:N/A  I have personally reviewed following labs and imaging studies  LABORATORY DATA: CBC: Recent Labs  Lab 04/27/18 1048 05/03/18 0238  WBC 2.8* 4.4  HGB 8.2* 8.8*  HCT 25.7* 29.5*  MCV 93.1 95.5  PLT 121* 107*    Basic Metabolic Panel: Recent Labs  Lab 04/28/18 0315 04/29/18 0516 04/30/18 0345 05/03/18 0238  NA 143 142 140 143  K 3.3* 3.7 3.9 3.9  CL 117* 116* 116* 119*  CO2 17* 17* 17* 15*  GLUCOSE 168* 107* 187* 232*  BUN 45* 41* 39* 43*  CREATININE 2.21* 2.02* 1.87* 1.86*  CALCIUM 7.4* 7.7* 7.8* 8.1*  MG 1.9  --   --   --     GFR: Estimated Creatinine Clearance: 66.3 mL/min (A) (by C-G formula based on SCr of 1.86 mg/dL (H)).  Liver Function Tests: No results for input(s): AST, ALT, ALKPHOS, BILITOT, PROT, ALBUMIN in the last 168 hours. No results for input(s): LIPASE, AMYLASE in the last 168 hours. No results for input(s): AMMONIA in the last 168 hours.  Coagulation Profile: No results  for input(s): INR, PROTIME in the last 168 hours.  Cardiac Enzymes: No results for input(s): CKTOTAL, CKMB, CKMBINDEX, TROPONINI in the last 168 hours.  BNP (last 3 results) No results for input(s): PROBNP in the last 8760 hours.  HbA1C: No results for input(s): HGBA1C in the last 72 hours.  CBG: Recent Labs  Lab 05/03/18 1217 05/03/18 1717 05/03/18 2203 05/04/18 0030 05/04/18 0803  GLUCAP 159* 213* 225* 195* 139*    Lipid Profile: No results for input(s): CHOL, HDL, LDLCALC, TRIG, CHOLHDL, LDLDIRECT in the last 72 hours.  Thyroid Function Tests: No results for input(s): TSH, T4TOTAL, FREET4, T3FREE, THYROIDAB in the last 72 hours.  Anemia Panel: No results for input(s): VITAMINB12, FOLATE, FERRITIN, TIBC, IRON, RETICCTPCT in the last 72 hours.  Urine analysis:    Component Value Date/Time   COLORURINE AMBER (A) 04/08/2018 1306   APPEARANCEUR CLOUDY (A) 04/08/2018 1306   LABSPEC 1.018 04/08/2018 1306   PHURINE 5.0 04/08/2018 1306   GLUCOSEU NEGATIVE 04/08/2018 1306   HGBUR MODERATE (A) 04/08/2018 1306   BILIRUBINUR NEGATIVE 04/08/2018 1306   KETONESUR NEGATIVE 04/08/2018 1306   PROTEINUR 100 (A) 04/08/2018 1306   NITRITE NEGATIVE 04/08/2018 1306   LEUKOCYTESUR LARGE (A) 04/08/2018 1306    Sepsis Labs: Lactic Acid, Venous    Component Value Date/Time   LATICACIDVEN 0.9 04/22/2018 0419    MICROBIOLOGY: No results found for this or any previous visit (from the past 240 hour(s)).  RADIOLOGY STUDIES/RESULTS: Dg Chest 1 View  Result Date: 04/08/2018 CLINICAL DATA:  Hemodialysis catheter insertion. EXAM: CHEST  1 VIEW COMPARISON:  04/08/2018 FINDINGS: There is a right IJ central venous catheter in the mid SVC. The catheter tip is long lateral wall near the brachiocephalic SVC junction. The endotracheal tube is in good position at the mid tracheal level. There is an NG tube coursing down the esophagus and into the stomach. Stable cardiac enlargement and prominent  mediastinum. Persistent pulmonary edema and pleural effusions. Slight improved lung aeration when compared to earlier film. IMPRESSION: 1. Left IJ central venous catheter tip in the SVC near the brachiocephalic SVC junction. 2. Right subclavian central venous catheter is stable. 3. Stable ET and NG tubes. 4. Persistent cardiac enlargement, central vascular congestion and pulmonary edema but slight improved aeration since earlier film. 5. Small effusions. Electronically Signed   By: Marijo Sanes M.D.   On: 04/08/2018  13:11   Dg Abd 1 View  Result Date: 04/22/2018 CLINICAL DATA:  OG tube placement. EXAM: ABDOMEN - 1 VIEW COMPARISON:  None. FINDINGS: Tip and side port of the enteric tube below the diaphragm in the stomach. Single prominent bowel loop in the upper abdomen is likely transverse colon. Cholecystectomy clips. IMPRESSION: Tip and side port of the enteric tube below the diaphragm in the stomach. Electronically Signed   By: Keith Rake M.D.   On: 04/22/2018 00:06   US Renal  Result Date: 04/07/2018 CLINICAL DATA:  Acute renal failure. EXAM: RENAL / URINARY TRACT ULTRASOUND COMPLETE COMPARISON:  Yesterday. FINDINGS: Right Kidney: Renal measurements: 13.2 x 6.8 x 6 0 cm = volume: 281 mL . Echogenicity within normal limits. No mass or hydronephrosis visualized. Left Kidney: Renal measurements: 13.2 x 6 0 x 5.2 cm = volume: 215 mL. Echogenicity within normal limits. No mass or hydronephrosis visualized. Bladder: Not visualized with a Foley catheter in place. IMPRESSION: 1. Normal kidneys without hydronephrosis. 2. Nonvisualized urinary bladder with a Foley catheter in place. Electronically Signed   By: Claudie Revering M.D.   On: 04/07/2018 19:38   Dg Chest Port 1 View  Result Date: 04/23/2018 CLINICAL DATA:  Intubated patient admitted 04/21/2018 with altered mental status. EXAM: PORTABLE CHEST 1 VIEW COMPARISON:  Single-view of the chest 04/21/2018 and 04/12/2018. FINDINGS: Right IJ catheter tip  projects in the right subclavian vein, unchanged. NG tube courses into the stomach and below the inferior margin the film. Endotracheal tube tip projects at the thoracic inlet and should be advanced approximately 3 cm. Cardiomegaly is again seen. Bilateral airspace disease persists but aeration in the right upper lung zone has improved. IMPRESSION: ETT tip projects at the thoracic inlet. The tube should be advanced approximately 3 cm. Bilateral airspace disease persist but aeration in the right upper lobe is markedly improved. Electronically Signed   By: Inge Rise M.D.   On: 04/23/2018 07:51   Dg Chest Port 1 View  Result Date: 04/21/2018 CLINICAL DATA:  Respiratory failure EXAM: PORTABLE CHEST 1 VIEW COMPARISON:  04/21/2018 FINDINGS: Interval placement of esophagogastric tube, tip and side-port appear to be below the diaphragm although the lower portion of the examination is very underpenetrated, limiting visualization. Slight interval improvement in aeration of the right upper lobe. Otherwise unchanged examination with endotracheal tube projecting below the thoracic inlet, right neck vascular catheter, and cardiomegaly. IMPRESSION: Interval placement of esophagogastric tube, tip and side-port appear to be below the diaphragm although the lower portion of the examination is very underpenetrated, limiting visualization. Slight interval improvement in aeration of the right upper lobe. Otherwise unchanged examination with endotracheal tube projecting below the thoracic inlet, right neck vascular catheter, and cardiomegaly. Electronically Signed   By: Eddie Candle M.D.   On: 04/21/2018 23:01   Dg Chest Port 1 View  Result Date: 04/12/2018 CLINICAL DATA:  Abnormal respirations. EXAM: PORTABLE CHEST 1 VIEW COMPARISON:  04/10/2018. FINDINGS: Interim extubation and removal of NG tube. Left IJ line noted with tip over superior vena cava. Cardiomegaly. Diffuse bilateral pulmonary infiltrates/edema. Improved  aeration right upper lung. No pleural effusion or pneumothorax. IMPRESSION: 1. Interim extubation and removal of NG tube. Left IJ line stable position. 2.  Cardiomegaly, no interim change. 3. Diffuse bilateral pulmonary infiltrates/edema noted. Improved aeration right upper lung. Electronically Signed   By: Marcello Moores  Register   On: 04/12/2018 06:19   Dg Chest Port 1 View  Result Date: 04/10/2018 CLINICAL DATA:  Hypoxia EXAM: PORTABLE  CHEST 1 VIEW COMPARISON:  April 09, 2018 FINDINGS: Endotracheal tube tip is 7.5 cm above the carina. Central catheter tip is at the junction of the superior vena cava and left innominate vein. Nasogastric tube tip and side port are below the diaphragm. No pneumothorax. : There is patchy atelectasis in the left lower lobe and right upper lobe regions. There is no appreciable edema or consolidation. Heart is mildly enlarged with pulmonary vascularity normal. No adenopathy. No bone lesions. IMPRESSION: Tube and catheter positions as described without pneumothorax. Areas of patchy atelectasis bilaterally. No frank consolidation or edema. Stable cardiac enlargement. Electronically Signed   By: Lowella Grip III M.D.   On: 04/10/2018 07:16   Dg Chest Port 1 View  Result Date: 04/09/2018 CLINICAL DATA:  Shortness of breath EXAM: PORTABLE CHEST 1 VIEW COMPARISON:  04/08/2018 FINDINGS: Endotracheal tube tip is at the level of the clavicular heads. Left IJ central venous catheter tip is in the mid SVC. Orogastric tube tip is beyond the field of view. There is moderate cardiomegaly with mild pulmonary edema and small pleural effusions. Right subclavian approach central venous catheter is unchanged. IMPRESSION: Cardiomegaly, small pleural effusions and mild pulmonary edema. Unchanged support apparatus. Electronically Signed   By: Ulyses Jarred M.D.   On: 04/09/2018 06:18   Dg Chest Port 1 View  Result Date: 04/08/2018 CLINICAL DATA:  Respiratory failure EXAM: PORTABLE CHEST 1 VIEW  COMPARISON:  04/07/2018 FINDINGS: Support Apparatus: --Endotracheal tube: Tip at the level of the clavicular heads. --Enteric tube:Tip and sideport are below the field of view. --Catheter(s):Right subclavian vein approach central venous catheter tip is at the lower SVC --Other: None Unchanged cardiomegaly and mild pulmonary edema. Failing pleural effusions are also unchanged. IMPRESSION: Unchanged appearance of the chest with findings of congestive heart failure. Unchanged support apparatus. Electronically Signed   By: Ulyses Jarred M.D.   On: 04/08/2018 05:17   Dg Chest Port 1 View  Result Date: 04/07/2018 CLINICAL DATA:  Acute respiratory failure. EXAM: PORTABLE CHEST 1 VIEW COMPARISON:  Earlier today. FINDINGS: Endotracheal tube in satisfactory position. Stable right central venous catheter. Nasogastric tube extending into the stomach. Progressive enlargement of the cardiac silhouette. Diffuse prominence of the pulmonary vasculature and interstitial markings with less bilateral airspace opacity with an improved inspiration. Unremarkable bones. IMPRESSION: Mildly progressive cardiomegaly with grossly stable changes of congestive heart failure. Electronically Signed   By: Claudie Revering M.D.   On: 04/07/2018 19:42     LOS: 13 days   Oren Binet, MD  [facility]  If 7PM-7AM, please contact night-coverage  Please page via [email or web address]  Go to [email or web address] and use [facility]'s universal password to access. If you do not have the password, please contact the hospital operator.  Locate the [facility] provider you are looking for under [facility] and page to a number that you can be directly reached. If you still have difficulty reaching the provider, please page the [facility] (Director on Call) for the Hospitalists listed on amion for assistance.  05/04/2018, 9:57 AM

## 2018-05-04 NOTE — Progress Notes (Signed)
Occupational Therapy Treatment Patient Details Name: Eddie Davies MRN: [id number] DOB: 23-Apr-1970 Today's Date: 05/04/2018    History of present illness Patient is a 48 y/o male who presents from [facility] with AMS and acute respiratory failure with hypoxemia; intubated 3/28- 3/30. Recent admission 3/14-3/21 with septic shock from complicated UTI. CXR- RUL infiltrate. PMH includes dm, seizures, htn, esld, esrd ckd stage IV, cognitive delays.    OT comments  Pt. Seen for co tx PT/OT to address functional mobility in preparation for increasing safety and independence with adls  Follow Up Recommendations  SNF;Supervision/Assistance - 24 hour    Equipment Recommendations  Other (comment)    Recommendations for Other Services      Precautions / Restrictions Precautions Precautions: Fall Precaution Comments: flexiseal       Mobility Bed Mobility Overal bed mobility: Needs Assistance Bed Mobility: Rolling;Sidelying to Sit;Sit to Supine Rolling: Min guard Sidelying to sit: Mod assist;HOB elevated Supine to sit: Mod assist;+2 for physical assistance     General bed mobility comments: min guard for safety, increased time and effort needed, use of bed rail  Transfers                      Balance                                           ADL either performed or assessed with clinical judgement   ADL Overall ADL's : Needs assistance/impaired             Lower Body Bathing: Total assistance;Bed level Lower Body Bathing Details (indicate cue type and reason): flexiseal leaking and pt. required full clean up and linen changed with pt. rolling L/R                     Functional mobility during ADLs: +2 for physical assistance(bed mobility and seated eob, refused oob)       Vision       Perception     Praxis      Cognition Arousal/Alertness: Awake/alert Behavior During Therapy: Flat affect;Agitated Overall Cognitive Status:  Within Functional Limits for tasks assessed                                          Exercises     Shoulder Instructions       General Comments  pt. Loves older 1990s country music. 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    Pertinent Vitals/ Pain       Pain Assessment: Faces Faces Pain Scale: Hurts little more Pain Location: L ankle Pain Descriptors / Indicators: Aching Pain Intervention(s): Limited activity within patient's tolerance;Repositioned  Home Living                                          Prior Functioning/Environment              Frequency  Min 2X/week        Progress Toward Goals  OT Goals(current goals can now be found in the care plan section)  Progress towards OT goals: Progressing toward goals     Plan Discharge plan remains appropriate  Co-evaluation    PT/OT/SLP Co-Evaluation/Treatment: Yes Reason for Co-Treatment: Complexity of the patient's impairments (multi-system involvement);For patient/therapist safety;Necessary to address cognition/behavior during functional activity;To address functional/ADL transfers PT goals addressed during session: Mobility/safety with mobility OT goals addressed during session: ADL's and self-care      AM-PAC OT "6 Clicks" Daily Activity     Outcome Measure   Help from another person eating meals?: A Little Help from another person taking care of personal grooming?: A Little Help from another person toileting, which includes using toliet, bedpan, or urinal?: Total Help from another person bathing (including washing, rinsing, drying)?: A Lot Help from another person to put on and taking off regular upper body clothing?: A Lot Help from another person to put on and taking off regular lower body clothing?: Total 6 Click Score: 12    End of Session    OT Visit Diagnosis: Unsteadiness on feet (R26.81);Muscle weakness (generalized) (M62.81);Other symptoms and signs  involving cognitive function   Activity Tolerance Other (comment)(pt. providing short list of what he will and will not do functionally)   Patient Left     Nurse Communication Other (comment)(reviewed that despite being in place flexiseal is leaking large amounts)        Time: 5329-9242 OT Time Calculation (min): 28 min  Charges: OT General Charges $OT Visit: 1 Visit OT Treatments $Self Care/Home Management : 8-22 mins   Janice Coffin, COTA/L 05/04/2018, 1:51 PM

## 2018-05-05 LAB — GLUCOSE, CAPILLARY
Glucose-Capillary: 117 mg/dL — ABNORMAL HIGH (ref 70–99)
Glucose-Capillary: 208 mg/dL — ABNORMAL HIGH (ref 70–99)

## 2018-05-05 NOTE — Progress Notes (Signed)
Clinical Social Worker facilitated patient discharge including contacting patient family and facility to confirm patient discharge plans. Clinical information faxed to facility and family agreeable with plan. CSW arranged ambulance transport via [place] to [facility] . RN to call for report prior to discharge [phone]).  Clinical Social Worker will sign off for now as social work intervention is no longer needed. Please consult [place] again if new need arises.  [place], [place]

## 2018-05-05 NOTE — Plan of Care (Signed)
Adequate for discharge.

## 2018-05-11 DIAGNOSIS — D509 Iron deficiency anemia, unspecified: Secondary | ICD-10-CM | POA: Insufficient documentation

## 2018-05-11 DIAGNOSIS — M898X9 Other specified disorders of bone, unspecified site: Secondary | ICD-10-CM

## 2018-05-11 DIAGNOSIS — E889 Metabolic disorder, unspecified: Secondary | ICD-10-CM | POA: Diagnosis present

## 2018-05-11 DIAGNOSIS — E559 Vitamin D deficiency, unspecified: Secondary | ICD-10-CM | POA: Insufficient documentation

## 2018-05-11 HISTORY — DX: Other specified disorders of bone, unspecified site: M89.8X9

## 2018-05-14 DIAGNOSIS — E1169 Type 2 diabetes mellitus with other specified complication: Secondary | ICD-10-CM | POA: Diagnosis not present

## 2018-05-14 DIAGNOSIS — I10 Essential (primary) hypertension: Secondary | ICD-10-CM | POA: Diagnosis not present

## 2018-05-14 DIAGNOSIS — B3789 Other sites of candidiasis: Secondary | ICD-10-CM

## 2018-05-14 DIAGNOSIS — K7290 Hepatic failure, unspecified without coma: Secondary | ICD-10-CM | POA: Diagnosis not present

## 2018-05-14 DIAGNOSIS — D649 Anemia, unspecified: Secondary | ICD-10-CM

## 2018-05-14 DIAGNOSIS — R7989 Other specified abnormal findings of blood chemistry: Secondary | ICD-10-CM

## 2018-05-14 DIAGNOSIS — D696 Thrombocytopenia, unspecified: Secondary | ICD-10-CM

## 2018-05-14 DIAGNOSIS — N390 Urinary tract infection, site not specified: Secondary | ICD-10-CM | POA: Diagnosis not present

## 2018-05-14 DIAGNOSIS — N185 Chronic kidney disease, stage 5: Secondary | ICD-10-CM

## 2018-05-15 DIAGNOSIS — R7989 Other specified abnormal findings of blood chemistry: Secondary | ICD-10-CM

## 2018-05-15 DIAGNOSIS — I10 Essential (primary) hypertension: Secondary | ICD-10-CM

## 2018-05-15 DIAGNOSIS — B3789 Other sites of candidiasis: Secondary | ICD-10-CM

## 2018-05-15 DIAGNOSIS — N185 Chronic kidney disease, stage 5: Secondary | ICD-10-CM

## 2018-05-15 DIAGNOSIS — N390 Urinary tract infection, site not specified: Secondary | ICD-10-CM

## 2018-05-15 DIAGNOSIS — K7290 Hepatic failure, unspecified without coma: Secondary | ICD-10-CM

## 2018-05-15 DIAGNOSIS — D649 Anemia, unspecified: Secondary | ICD-10-CM

## 2018-05-15 DIAGNOSIS — D696 Thrombocytopenia, unspecified: Secondary | ICD-10-CM

## 2018-05-15 DIAGNOSIS — E1169 Type 2 diabetes mellitus with other specified complication: Secondary | ICD-10-CM

## 2018-05-16 DIAGNOSIS — N390 Urinary tract infection, site not specified: Secondary | ICD-10-CM | POA: Diagnosis not present

## 2018-05-16 DIAGNOSIS — K7290 Hepatic failure, unspecified without coma: Secondary | ICD-10-CM | POA: Diagnosis not present

## 2018-05-16 DIAGNOSIS — I10 Essential (primary) hypertension: Secondary | ICD-10-CM | POA: Diagnosis not present

## 2018-05-16 DIAGNOSIS — E1169 Type 2 diabetes mellitus with other specified complication: Secondary | ICD-10-CM | POA: Diagnosis not present

## 2018-05-18 DIAGNOSIS — D649 Anemia, unspecified: Secondary | ICD-10-CM | POA: Diagnosis not present

## 2018-05-19 ENCOUNTER — Inpatient Hospital Stay (HOSPITAL_COMMUNITY)
Admission: EM | Admit: 2018-05-19 | Discharge: 2018-05-31 | DRG: 673 | Disposition: A | Discharge status: Skilled Nursing Facility | Payer: Medicare Other | Source: Skilled Nursing Facility | Attending: Internal Medicine | Admitting: Internal Medicine

## 2018-05-19 ENCOUNTER — Other Ambulatory Visit: Payer: Self-pay

## 2018-05-19 ENCOUNTER — Emergency Department (HOSPITAL_COMMUNITY): Payer: Medicare Other

## 2018-05-19 ENCOUNTER — Encounter (HOSPITAL_COMMUNITY): Payer: Self-pay | Admitting: Emergency Medicine

## 2018-05-19 ENCOUNTER — Inpatient Hospital Stay (HOSPITAL_COMMUNITY): Payer: Medicare Other

## 2018-05-19 DIAGNOSIS — R569 Unspecified convulsions: Secondary | ICD-10-CM | POA: Diagnosis not present

## 2018-05-19 DIAGNOSIS — G9341 Metabolic encephalopathy: Secondary | ICD-10-CM | POA: Diagnosis present

## 2018-05-19 DIAGNOSIS — Z6837 Body mass index (BMI) 37.0-37.9, adult: Secondary | ICD-10-CM

## 2018-05-19 DIAGNOSIS — Z79899 Other long term (current) drug therapy: Secondary | ICD-10-CM | POA: Diagnosis not present

## 2018-05-19 DIAGNOSIS — A419 Sepsis, unspecified organism: Secondary | ICD-10-CM | POA: Diagnosis present

## 2018-05-19 DIAGNOSIS — Z993 Dependence on wheelchair: Secondary | ICD-10-CM | POA: Diagnosis not present

## 2018-05-19 DIAGNOSIS — K7460 Unspecified cirrhosis of liver: Secondary | ICD-10-CM | POA: Diagnosis not present

## 2018-05-19 DIAGNOSIS — N183 Chronic kidney disease, stage 3 (moderate): Secondary | ICD-10-CM | POA: Diagnosis present

## 2018-05-19 DIAGNOSIS — Z794 Long term (current) use of insulin: Secondary | ICD-10-CM

## 2018-05-19 DIAGNOSIS — R410 Disorientation, unspecified: Secondary | ICD-10-CM | POA: Diagnosis present

## 2018-05-19 DIAGNOSIS — R680 Hypothermia, not associated with low environmental temperature: Secondary | ICD-10-CM | POA: Diagnosis present

## 2018-05-19 DIAGNOSIS — F419 Anxiety disorder, unspecified: Secondary | ICD-10-CM | POA: Diagnosis present

## 2018-05-19 DIAGNOSIS — Z452 Encounter for adjustment and management of vascular access device: Secondary | ICD-10-CM

## 2018-05-19 DIAGNOSIS — Z992 Dependence on renal dialysis: Secondary | ICD-10-CM | POA: Diagnosis not present

## 2018-05-19 DIAGNOSIS — I129 Hypertensive chronic kidney disease with stage 1 through stage 4 chronic kidney disease, or unspecified chronic kidney disease: Secondary | ICD-10-CM | POA: Diagnosis present

## 2018-05-19 DIAGNOSIS — K861 Other chronic pancreatitis: Secondary | ICD-10-CM | POA: Diagnosis not present

## 2018-05-19 DIAGNOSIS — R4182 Altered mental status, unspecified: Secondary | ICD-10-CM | POA: Diagnosis present

## 2018-05-19 DIAGNOSIS — D631 Anemia in chronic kidney disease: Secondary | ICD-10-CM | POA: Diagnosis present

## 2018-05-19 DIAGNOSIS — E7800 Pure hypercholesterolemia, unspecified: Secondary | ICD-10-CM | POA: Diagnosis not present

## 2018-05-19 DIAGNOSIS — E785 Hyperlipidemia, unspecified: Secondary | ICD-10-CM | POA: Diagnosis present

## 2018-05-19 DIAGNOSIS — E1165 Type 2 diabetes mellitus with hyperglycemia: Secondary | ICD-10-CM | POA: Diagnosis present

## 2018-05-19 DIAGNOSIS — R069 Unspecified abnormalities of breathing: Secondary | ICD-10-CM

## 2018-05-19 DIAGNOSIS — N179 Acute kidney failure, unspecified: Secondary | ICD-10-CM | POA: Diagnosis not present

## 2018-05-19 DIAGNOSIS — D696 Thrombocytopenia, unspecified: Secondary | ICD-10-CM | POA: Diagnosis not present

## 2018-05-19 DIAGNOSIS — N390 Urinary tract infection, site not specified: Secondary | ICD-10-CM | POA: Diagnosis not present

## 2018-05-19 DIAGNOSIS — E118 Type 2 diabetes mellitus with unspecified complications: Secondary | ICD-10-CM | POA: Diagnosis present

## 2018-05-19 DIAGNOSIS — I959 Hypotension, unspecified: Secondary | ICD-10-CM | POA: Diagnosis present

## 2018-05-19 DIAGNOSIS — R400 Somnolence: Secondary | ICD-10-CM | POA: Diagnosis not present

## 2018-05-19 DIAGNOSIS — R791 Abnormal coagulation profile: Secondary | ICD-10-CM | POA: Diagnosis present

## 2018-05-19 DIAGNOSIS — E872 Acidosis, unspecified: Secondary | ICD-10-CM

## 2018-05-19 DIAGNOSIS — N170 Acute kidney failure with tubular necrosis: Secondary | ICD-10-CM | POA: Diagnosis not present

## 2018-05-19 DIAGNOSIS — E889 Metabolic disorder, unspecified: Secondary | ICD-10-CM | POA: Diagnosis present

## 2018-05-19 DIAGNOSIS — N400 Enlarged prostate without lower urinary tract symptoms: Secondary | ICD-10-CM | POA: Diagnosis present

## 2018-05-19 DIAGNOSIS — I10 Essential (primary) hypertension: Secondary | ICD-10-CM | POA: Diagnosis present

## 2018-05-19 DIAGNOSIS — Z20828 Contact with and (suspected) exposure to other viral communicable diseases: Secondary | ICD-10-CM | POA: Diagnosis present

## 2018-05-19 DIAGNOSIS — M898X9 Other specified disorders of bone, unspecified site: Secondary | ICD-10-CM | POA: Diagnosis present

## 2018-05-19 DIAGNOSIS — D61818 Other pancytopenia: Secondary | ICD-10-CM | POA: Diagnosis not present

## 2018-05-19 DIAGNOSIS — G40909 Epilepsy, unspecified, not intractable, without status epilepticus: Secondary | ICD-10-CM | POA: Diagnosis present

## 2018-05-19 DIAGNOSIS — E1122 Type 2 diabetes mellitus with diabetic chronic kidney disease: Secondary | ICD-10-CM | POA: Diagnosis present

## 2018-05-19 HISTORY — DX: Acidosis: E87.2

## 2018-05-19 HISTORY — DX: Unspecified cirrhosis of liver: K74.60

## 2018-05-19 HISTORY — DX: Type 2 diabetes mellitus without complications: E11.9

## 2018-05-19 HISTORY — DX: Unspecified kidney failure: N19

## 2018-05-19 HISTORY — DX: Hepatic failure, unspecified without coma: K72.90

## 2018-05-19 LAB — COMPREHENSIVE METABOLIC PANEL
ALT: 16 U/L (ref 0–44)
AST: 12 U/L — ABNORMAL LOW (ref 15–41)
Albumin: 2.8 g/dL — ABNORMAL LOW (ref 3.5–5.0)
Alkaline Phosphatase: 140 U/L — ABNORMAL HIGH (ref 38–126)
Anion gap: 15 (ref 5–15)
BUN: 64 mg/dL — ABNORMAL HIGH (ref 6–20)
CO2: 9 mmol/L — ABNORMAL LOW (ref 22–32)
Calcium: 7.3 mg/dL — ABNORMAL LOW (ref 8.9–10.3)
Chloride: 116 mmol/L — ABNORMAL HIGH (ref 98–111)
Creatinine, Ser: 8.11 mg/dL — ABNORMAL HIGH (ref 0.61–1.24)
GFR calc Af Amer: 8 mL/min — ABNORMAL LOW (ref >60)
GFR calc non Af Amer: 7 mL/min — ABNORMAL LOW (ref >60)
Glucose, Bld: 182 mg/dL — ABNORMAL HIGH (ref 70–99)
Potassium: 4.8 mmol/L (ref 3.5–5.1)
Sodium: 140 mmol/L (ref 135–145)
Total Bilirubin: 0.5 mg/dL (ref 0.3–1.2)
Total Protein: 6.2 g/dL — ABNORMAL LOW (ref 6.5–8.1)

## 2018-05-19 LAB — URINALYSIS, ROUTINE W REFLEX MICROSCOPIC
Bilirubin Urine: NEGATIVE
Glucose, UA: 50 mg/dL — AB
Ketones, ur: NEGATIVE mg/dL
Nitrite: NEGATIVE
Protein, ur: 30 mg/dL — AB
Specific Gravity, Urine: 1.014 (ref 1.005–1.030)
pH: 5 (ref 5.0–8.0)

## 2018-05-19 LAB — LACTIC ACID, PLASMA: Lactic Acid, Venous: 1.3 mmol/L (ref 0.5–1.9)

## 2018-05-19 LAB — SARS CORONAVIRUS 2 BY RT PCR (HOSPITAL ORDER, PERFORMED IN ~~LOC~~ HOSPITAL LAB): SARS Coronavirus 2: NEGATIVE

## 2018-05-19 LAB — BASIC METABOLIC PANEL
Anion gap: 8 (ref 5–15)
Anion gap: 14 (ref 5–15)
BUN: 48 mg/dL — ABNORMAL HIGH (ref 6–20)
BUN: 65 mg/dL — ABNORMAL HIGH (ref 6–20)
CO2: 7 mmol/L — ABNORMAL LOW (ref 22–32)
CO2: 11 mmol/L — ABNORMAL LOW (ref 22–32)
Calcium: 5 mg/dL — CL (ref 8.9–10.3)
Calcium: 6.9 mg/dL — ABNORMAL LOW (ref 8.9–10.3)
Chloride: 129 mmol/L — ABNORMAL HIGH (ref 98–111)
Chloride: 114 mmol/L — ABNORMAL HIGH (ref 98–111)
Creatinine, Ser: 5.81 mg/dL — ABNORMAL HIGH (ref 0.61–1.24)
Creatinine, Ser: 8.34 mg/dL — ABNORMAL HIGH (ref 0.61–1.24)
GFR calc Af Amer: 12 mL/min — ABNORMAL LOW (ref >60)
GFR calc Af Amer: 8 mL/min — ABNORMAL LOW (ref >60)
GFR calc non Af Amer: 11 mL/min — ABNORMAL LOW (ref >60)
GFR calc non Af Amer: 7 mL/min — ABNORMAL LOW (ref >60)
Glucose, Bld: 140 mg/dL — ABNORMAL HIGH (ref 70–99)
Glucose, Bld: 181 mg/dL — ABNORMAL HIGH (ref 70–99)
Potassium: 3.2 mmol/L — ABNORMAL LOW (ref 3.5–5.1)
Potassium: 4.6 mmol/L (ref 3.5–5.1)
Sodium: 144 mmol/L (ref 135–145)
Sodium: 139 mmol/L (ref 135–145)

## 2018-05-19 LAB — AMMONIA: Ammonia: 48 umol/L — ABNORMAL HIGH (ref 9–35)

## 2018-05-19 LAB — CBC WITH DIFFERENTIAL/PLATELET
Abs Immature Granulocytes: 0.07 10*3/uL (ref 0.00–0.07)
Basophils Absolute: 0 10*3/uL (ref 0.0–0.1)
Basophils Relative: 0 %
Eosinophils Absolute: 0.1 10*3/uL (ref 0.0–0.5)
Eosinophils Relative: 2 %
HCT: 23.8 % — ABNORMAL LOW (ref 39.0–52.0)
Hemoglobin: 7.2 g/dL — ABNORMAL LOW (ref 13.0–17.0)
Immature Granulocytes: 2 %
Lymphocytes Relative: 22 %
Lymphs Abs: 1 10*3/uL (ref 0.7–4.0)
MCH: 29.9 pg (ref 26.0–34.0)
MCHC: 30.3 g/dL (ref 30.0–36.0)
MCV: 98.8 fL (ref 80.0–100.0)
Monocytes Absolute: 0.4 10*3/uL (ref 0.1–1.0)
Monocytes Relative: 8 %
Neutro Abs: 3.1 10*3/uL (ref 1.7–7.7)
Neutrophils Relative %: 66 %
Platelets: 64 10*3/uL — ABNORMAL LOW (ref 150–400)
RBC: 2.41 MIL/uL — ABNORMAL LOW (ref 4.22–5.81)
RDW: 16.3 % — ABNORMAL HIGH (ref 11.5–15.5)
WBC: 4.6 10*3/uL (ref 4.0–10.5)
nRBC: 1.1 % — ABNORMAL HIGH (ref 0.0–0.2)

## 2018-05-19 LAB — PROTIME-INR
INR: 1.6 — ABNORMAL HIGH (ref 0.8–1.2)
Prothrombin Time: 18.7 seconds — ABNORMAL HIGH (ref 11.4–15.2)

## 2018-05-19 LAB — MRSA PCR SCREENING: MRSA by PCR: NEGATIVE

## 2018-05-19 LAB — GLUCOSE, CAPILLARY
Glucose-Capillary: 158 mg/dL — ABNORMAL HIGH (ref 70–99)
Glucose-Capillary: 158 mg/dL — ABNORMAL HIGH (ref 70–99)

## 2018-05-19 LAB — POCT I-STAT 7, (LYTES, BLD GAS, ICA,H+H)
Acid-base deficit: 20 mmol/L — ABNORMAL HIGH (ref 0.0–2.0)
Bicarbonate: 10.4 mmol/L — ABNORMAL LOW (ref 20.0–28.0)
Calcium, Ion: 1.13 mmol/L — ABNORMAL LOW (ref 1.15–1.40)
HCT: 20 % — ABNORMAL LOW (ref 39.0–52.0)
Hemoglobin: 6.8 g/dL — CL (ref 13.0–17.0)
O2 Saturation: 92 %
Patient temperature: 90
Potassium: 4.7 mmol/L (ref 3.5–5.1)
Sodium: 141 mmol/L (ref 135–145)
TCO2: 12 mmol/L — ABNORMAL LOW (ref 22–32)
pCO2 arterial: 35.3 mmHg (ref 32.0–48.0)
pH, Arterial: 7.043 — CL (ref 7.350–7.450)
pO2, Arterial: 74 mmHg — ABNORMAL LOW (ref 83.0–108.0)

## 2018-05-19 LAB — RAPID URINE DRUG SCREEN, HOSP PERFORMED
Amphetamines: NOT DETECTED
Barbiturates: POSITIVE — AB
Benzodiazepines: NOT DETECTED
Cocaine: NOT DETECTED
Opiates: NOT DETECTED
Tetrahydrocannabinol: NOT DETECTED

## 2018-05-19 LAB — PREPARE RBC (CROSSMATCH)

## 2018-05-19 LAB — CBG MONITORING, ED: Glucose-Capillary: 158 mg/dL — ABNORMAL HIGH (ref 70–99)

## 2018-05-19 LAB — APTT: aPTT: 47 seconds — ABNORMAL HIGH (ref 24–36)

## 2018-05-19 MED ORDER — LACTULOSE ENEMA
300.0000 mL | Freq: Once | ORAL | Status: AC
  Administered 2018-05-19: 300 mL via RECTAL
  Filled 2018-05-19: qty 300

## 2018-05-19 MED ORDER — SODIUM CHLORIDE 0.9 % IV SOLN: 1.0000 g | INTRAVENOUS | Status: DC

## 2018-05-19 MED ORDER — VANCOMYCIN HCL IN DEXTROSE 1-5 GM/200ML-% IV SOLN: 1000.0000 mg | Freq: Once | INTRAVENOUS | Status: DC

## 2018-05-19 MED ORDER — NOREPINEPHRINE 4 MG/250ML-% IV SOLN
INTRAVENOUS | Status: AC
  Filled 2018-05-19: qty 250

## 2018-05-19 MED ORDER — SODIUM CHLORIDE 0.9 % IV BOLUS (SEPSIS)
500.0000 mL | Freq: Once | INTRAVENOUS | Status: AC
  Administered 2018-05-19: 10:00:00 500 mL via INTRAVENOUS

## 2018-05-19 MED ORDER — PRISMASOL BGK 4/2.5 32-4-2.5 MEQ/L REPLACEMENT SOLN
Status: DC
  Administered 2018-05-19 – 2018-05-21 (×3): via INTRAVENOUS_CENTRAL
  Filled 2018-05-19 (×4): qty 5000

## 2018-05-19 MED ORDER — PRISMASOL BGK 4/2.5 32-4-2.5 MEQ/L IV SOLN
INTRAVENOUS | Status: DC
  Administered 2018-05-19 – 2018-05-21 (×9): via INTRAVENOUS_CENTRAL
  Filled 2018-05-19 (×25): qty 5000

## 2018-05-19 MED ORDER — LACTULOSE 10 GM/15ML PO SOLN
10.0000 g | Freq: Three times a day (TID) | ORAL | Status: DC
  Administered 2018-05-19 – 2018-05-20 (×4): 10 g via ORAL
  Filled 2018-05-19 (×4): qty 15

## 2018-05-19 MED ORDER — INSULIN ASPART 100 UNIT/ML ~~LOC~~ SOLN
1.0000 [IU] | SUBCUTANEOUS | Status: DC
  Administered 2018-05-19 (×2): 2 [IU] via SUBCUTANEOUS
  Administered 2018-05-20: 15:00:00 1 [IU] via SUBCUTANEOUS
  Administered 2018-05-20: 20:00:00 2 [IU] via SUBCUTANEOUS
  Administered 2018-05-20: 1 [IU] via SUBCUTANEOUS

## 2018-05-19 MED ORDER — SODIUM BICARBONATE 8.4 % IV SOLN
INTRAVENOUS | Status: DC
  Administered 2018-05-19: 16:00:00 via INTRAVENOUS
  Filled 2018-05-19 (×8): qty 150

## 2018-05-19 MED ORDER — ATROPINE SULFATE 1 MG/10ML IJ SOSY
PREFILLED_SYRINGE | INTRAMUSCULAR | Status: AC
  Filled 2018-05-19: qty 10

## 2018-05-19 MED ORDER — VANCOMYCIN HCL 10 G IV SOLR
1250.0000 mg | INTRAVENOUS | Status: DC
  Administered 2018-05-20: 1250 mg via INTRAVENOUS
  Filled 2018-05-19 (×3): qty 1250

## 2018-05-19 MED ORDER — HEPARIN SODIUM (PORCINE) 1000 UNIT/ML DIALYSIS
1000.0000 [IU] | INTRAMUSCULAR | Status: DC | PRN
  Administered 2018-05-24: 16:00:00 2400 [IU] via INTRAVENOUS_CENTRAL
  Filled 2018-05-19 (×2): qty 6
  Filled 2018-05-19 (×2): qty 1

## 2018-05-19 MED ORDER — DEXMEDETOMIDINE HCL IN NACL 400 MCG/100ML IV SOLN: 0.4000 ug/kg/h | INTRAVENOUS | Status: DC

## 2018-05-19 MED ORDER — SODIUM CHLORIDE 0.9% IV SOLUTION: Freq: Once | INTRAVENOUS | Status: DC

## 2018-05-19 MED ORDER — SODIUM CHLORIDE 0.9 % IV SOLN: 2.0000 g | Freq: Two times a day (BID) | INTRAVENOUS | Status: DC

## 2018-05-19 MED ORDER — SODIUM CHLORIDE 0.9 % IV SOLN
2.0000 g | Freq: Once | INTRAVENOUS | Status: AC
  Administered 2018-05-19: 10:00:00 2 g via INTRAVENOUS
  Filled 2018-05-19: qty 2

## 2018-05-19 MED ORDER — VANCOMYCIN HCL 10 G IV SOLR
2000.0000 mg | Freq: Once | INTRAVENOUS | Status: AC
  Administered 2018-05-19: 2000 mg via INTRAVENOUS
  Filled 2018-05-19: qty 2000

## 2018-05-19 MED ORDER — SODIUM CHLORIDE 0.9 % IV BOLUS
500.0000 mL | Freq: Once | INTRAVENOUS | Status: AC
  Administered 2018-05-19: 16:00:00 500 mL via INTRAVENOUS

## 2018-05-19 MED ORDER — NOREPINEPHRINE 4 MG/250ML-% IV SOLN: 0.0000 ug/min | INTRAVENOUS | Status: DC

## 2018-05-19 MED ORDER — SODIUM BICARBONATE 8.4 % IV SOLN
50.0000 meq | Freq: Once | INTRAVENOUS | Status: AC
  Administered 2018-05-19: 14:00:00 50 meq via INTRAVENOUS
  Filled 2018-05-19: qty 50

## 2018-05-19 MED ORDER — PRISMASOL BGK 4/2.5 32-4-2.5 MEQ/L REPLACEMENT SOLN
Status: DC
  Administered 2018-05-19 – 2018-05-21 (×3): via INTRAVENOUS_CENTRAL
  Filled 2018-05-19 (×6): qty 5000

## 2018-05-19 MED ORDER — POTASSIUM CHLORIDE 10 MEQ/100ML IV SOLN: 10.0000 meq | INTRAVENOUS | Status: DC

## 2018-05-19 MED ORDER — SODIUM CHLORIDE 0.9 % IV SOLN
2.0000 g | Freq: Two times a day (BID) | INTRAVENOUS | Status: DC
  Administered 2018-05-20 – 2018-05-21 (×3): 2 g via INTRAVENOUS
  Filled 2018-05-19 (×3): qty 2

## 2018-05-19 MED ORDER — SODIUM CHLORIDE 0.9 % IV BOLUS: 500.0000 mL | Freq: Once | INTRAVENOUS | Status: DC

## 2018-05-19 MED ORDER — VANCOMYCIN VARIABLE DOSE PER UNSTABLE RENAL FUNCTION (PHARMACIST DOSING): Status: DC

## 2018-05-19 MED ORDER — POTASSIUM CHLORIDE CRYS ER 20 MEQ PO TBCR: 40.0000 meq | EXTENDED_RELEASE_TABLET | Freq: Once | ORAL | Status: DC

## 2018-05-19 MED ORDER — RIFAXIMIN 550 MG PO TABS
550.0000 mg | ORAL_TABLET | Freq: Two times a day (BID) | ORAL | Status: DC
  Administered 2018-05-19 – 2018-05-20 (×3): 550 mg via ORAL
  Filled 2018-05-19 (×3): qty 1

## 2018-05-19 NOTE — ED Notes (Signed)
Patient transported to CT 

## 2018-05-19 NOTE — Progress Notes (Signed)
Pharmacy Antibiotic Note  SENDER Eddie Davies is a 48 y.o. male admitted on 05/19/2018 with sepsis secondary to pneumonia. Pharmacy has been consulted for vancomycin and cefepime dosing. 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   Lac 1.3, Scr 8.11 (CrCl ~15), WBC 4.6, afebrile.  Plan for CRRT to be initiated tonight. Will adjust antibiotics accordingly. Received vancomycin 2g IV once in ED.   Plan:  Vancomycin 2063m IV x1 then 1250 mg IV every 24 hours once CRRT starts Change cefepime to 2 g IV every 12 hours Monitor Cxs, clinical status, renal function,  F/u LOT/de-escalation, and vanc levels prn   Height: 6' 1"  (185.4 cm) Weight: 280 lb 6.8 oz (127.2 kg) IBW/kg (Calculated) : 79.9  Temp (24hrs), Avg:91.4 F (33 C), Min:88.5 F (31.4 C), Max:94.7 F (34.8 C)  Recent Labs  Lab 05/19/18 0936 05/19/18 0941 05/19/18 1450 05/19/18 1620  WBC 4.6  --   --   --   CREATININE 8.11*  --  5.81* 8.34*  LATICACIDVEN  --  1.3  --   --     Estimated Creatinine Clearance: 15.3 mL/min (A) (by C-G formula based on SCr of 8.34 mg/dL (H)).    Allergies  Allergen Reactions  . Fish Oil Swelling and Other (See Comments)         Antimicrobials this admission: Vanc 4/25 >>  Cefepime 4/25 >>   Dose adjustments this admission: 4/25 adjusted for CRRT  Microbiology results:  4/25 BCx: sent  4/25 UCx:  sent  4/25 COVID: neg  4/25 MRSA PCR: neg  Thank you for allowing pharmacy to be a part of this patient's care.  KAntonietta Jewel PharmD, BMountain PineClinical Pharmacist  Pager: [phone]: [phone]/25/2020    6:48 PM Please check AMION for all MPioneernumbers

## 2018-05-19 NOTE — ED Notes (Signed)
ED TO INPATIENT HANDOFF REPORT  ED Nurse Name and Phone #: Joellen Jersey, [phone]  S Name/Age/Gender Eddie Davies 48 y.o. male Room/Bed: 026C/026C  Code Status   Code Status: Full Code  Home/SNF/Other Skilled nursing facility Patient oriented to: self, place, time and situation Is this baseline? Yes   Triage Complete: Triage complete  Chief Complaint ams  Triage Note Pt BIB [facility] from [place] with complaint of increased creatinine and ammonia levels. Pt altered with slurred  Speech.   Allergies Allergies  Allergen Reactions  . Fish Oil Swelling and Other (See Comments)         Level of Care/Admitting Diagnosis ED Disposition    ED Disposition Condition [facility] Area: [place] [100100]  Level of Care: ICU [6]  Covid Evaluation: N/A  Diagnosis: Metabolic acidosis [469629]  Admitting Physician: Len Blalock  Attending Physician: Len Blalock  Estimated length of stay: 5 - 7 days  Certification:: I certify this patient will need inpatient services for at least 2 midnights  PT Class (Do Not Modify): Inpatient [101]  PT Acc Code (Do Not Modify): Private [1]       B Medical/Surgery History Past Medical History:  Diagnosis Date  . Liver failure ([place])   . Renal failure    History reviewed. No pertinent surgical history.   A IV Location/Drains/Wounds Patient Lines/Drains/Airways Status   Active Line/Drains/Airways    Name:   Placement date:   Placement time:   Site:   Days:   Peripheral IV 05/19/18 Left Hand   05/19/18    0927    Hand   less than 1   Peripheral IV 05/19/18 Left Antecubital   05/19/18    0928    Antecubital   less than 1   Rectal Tube/Pouch   04/29/18    2300    -   20   Urethral Catheter Katie Latex 16 Fr.   05/19/18    1408    Latex   less than 1   Pressure Injury 04/21/18 Stage I -  Intact skin with non-blanchable redness of a localized area usually over a bony prominence.  redness, possible previous injury   04/21/18    2200     28   Pressure Injury 04/29/18 Stage II -  Partial thickness loss of dermis presenting as a shallow open ulcer with a red, pink wound bed without slough. small loss of skin   04/29/18    0022     20   Wound / Incision (Open or Dehisced) Non-pressure wound Coccyx   -    -    Coccyx      Wound / Incision (Open or Dehisced) 04/13/18 Scrotum Posterior;Medial   04/13/18    1100    Scrotum   36          Intake/Output Last 24 hours  Intake/Output Summary (Last 24 hours) at 05/19/2018 1455 Last data filed at 05/19/2018 1422 Gross per 24 hour  Intake 1100 ml  Output -  Net 1100 ml    Labs/Imaging Results for orders placed or performed during the hospital encounter of 05/19/18 (from the past 48 hour(s))  CBG monitoring, ED     Status: Abnormal   Collection Time: 05/19/18  9:26 AM  Result Value Ref Range   Glucose-Capillary 158 (H) 70 - 99 mg/dL  Comprehensive metabolic panel     Status: Abnormal   Collection Time: 05/19/18  9:36  AM  Result Value Ref Range   Sodium 140 135 - 145 mmol/L   Potassium 4.8 3.5 - 5.1 mmol/L   Chloride 116 (H) 98 - 111 mmol/L   CO2 9 (L) 22 - 32 mmol/L   Glucose, Bld 182 (H) 70 - 99 mg/dL   BUN 64 (H) 6 - 20 mg/dL   Creatinine, Ser 8.11 (H) 0.61 - 1.24 mg/dL   Calcium 7.3 (L) 8.9 - 10.3 mg/dL   Total Protein 6.2 (L) 6.5 - 8.1 g/dL   Albumin 2.8 (L) 3.5 - 5.0 g/dL   AST 12 (L) 15 - 41 U/L   ALT 16 0 - 44 U/L   Alkaline Phosphatase 140 (H) 38 - 126 U/L   Total Bilirubin 0.5 0.3 - 1.2 mg/dL   GFR calc non Af Amer 7 (L) >60 mL/min   GFR calc Af Amer 8 (L) >60 mL/min   Anion gap 15 5 - 15    Comment: Performed at [facility], [place] [address], [place], [place] [address]  CBC WITH DIFFERENTIAL     Status: Abnormal   Collection Time: 05/19/18  9:36 AM  Result Value Ref Range   WBC 4.6 4.0 - 10.5 K/uL   RBC 2.41 (L) 4.22 - 5.81 MIL/uL   Hemoglobin 7.2 (L) 13.0 - 17.0 g/dL   HCT 23.8 (L) 39.0 - 52.0  %   MCV 98.8 80.0 - 100.0 fL   MCH 29.9 26.0 - 34.0 pg   MCHC 30.3 30.0 - 36.0 g/dL   RDW 16.3 (H) 11.5 - 15.5 %   Platelets 64 (L) 150 - 400 K/uL    Comment: REPEATED TO VERIFY PLATELET COUNT CONFIRMED BY SMEAR SPECIMEN CHECKED FOR CLOTS Immature Platelet Fraction may be clinically indicated, consider ordering this additional test [id number]    nRBC 1.1 (H) 0.0 - 0.2 %   Neutrophils Relative % 66 %   Neutro Abs 3.1 1.7 - 7.7 K/uL   Lymphocytes Relative 22 %   Lymphs Abs 1.0 0.7 - 4.0 K/uL   Monocytes Relative 8 %   Monocytes Absolute 0.4 0.1 - 1.0 K/uL   Eosinophils Relative 2 %   Eosinophils Absolute 0.1 0.0 - 0.5 K/uL   Basophils Relative 0 %   Basophils Absolute 0.0 0.0 - 0.1 K/uL   Immature Granulocytes 2 %   Abs Immature Granulocytes 0.07 0.00 - 0.07 K/uL    Comment: Performed at [facility] [address] [place], [place] [address]  Lactic acid, plasma     Status: None   Collection Time: 05/19/18  9:41 AM  Result Value Ref Range   Lactic Acid, Venous 1.3 0.5 - 1.9 mmol/L    Comment: Performed at [facility] [address], [place], [place] [address]  Urinalysis, Routine w reflex microscopic     Status: Abnormal   Collection Time: 05/19/18  9:41 AM  Result Value Ref Range   Color, Urine AMBER (A) YELLOW    Comment: BIOCHEMICALS MAY BE AFFECTED BY COLOR   APPearance HAZY (A) CLEAR   Specific Gravity, Urine 1.014 1.005 - 1.030   pH 5.0 5.0 - 8.0   Glucose, UA 50 (A) NEGATIVE mg/dL   Hgb urine dipstick SMALL (A) NEGATIVE   Bilirubin Urine NEGATIVE NEGATIVE   Ketones, ur NEGATIVE NEGATIVE mg/dL   Protein, ur 30 (A) NEGATIVE mg/dL   Nitrite NEGATIVE NEGATIVE   Leukocytes,Ua MODERATE (A) NEGATIVE   RBC / HPF 11-20 0 - 5 RBC/hpf  WBC, UA 21-50 0 - 5 WBC/hpf   Bacteria, UA RARE (A) NONE SEEN   Squamous Epithelial / LPF 0-5 0 - 5   Mucus PRESENT    Budding Yeast PRESENT    Hyaline Casts, UA PRESENT     Comment: Performed at [facility],  [place] [address], [place], [place] [address]  Ammonia     Status: Abnormal   Collection Time: 05/19/18  9:43 AM  Result Value Ref Range   Ammonia 48 (H) 9 - 35 umol/L    Comment: Performed at [facility], [place] [address], [place], [place] [address]  SARS Coronavirus 2 [facility] order, Performed in [facility] hospital lab)     Status: None   Collection Time: 05/19/18  9:44 AM  Result Value Ref Range   SARS Coronavirus 2 NEGATIVE NEGATIVE    Comment: (NOTE) If result is NEGATIVE SARS-CoV-2 target nucleic acids are NOT DETECTED. The SARS-CoV-2 RNA is generally detectable in upper and lower  respiratory specimens during the acute phase of infection. The lowest  concentration of SARS-CoV-2 viral copies this assay can detect is 250  copies / mL. A negative result does not preclude SARS-CoV-2 infection  and should not be used as the sole basis for treatment or other  patient management decisions.  A negative result may occur with  improper specimen collection / handling, submission of specimen other  than nasopharyngeal swab, presence of viral mutation(s) within the  areas targeted by this assay, and inadequate number of viral copies  (<250 copies / mL). A negative result must be combined with clinical  observations, patient history, and epidemiological information. If result is POSITIVE SARS-CoV-2 target nucleic acids are DETECTED. The SARS-CoV-2 RNA is generally detectable in upper and lower  respiratory specimens dur ing the acute phase of infection.  Positive  results are indicative of active infection with SARS-CoV-2.  Clinical  correlation with patient history and other diagnostic information is  necessary to determine patient infection status.  Positive results do  not rule out bacterial infection or co-infection with other viruses. If result is PRESUMPTIVE POSTIVE SARS-CoV-2 nucleic acids MAY BE PRESENT.   A presumptive positive result was obtained on the submitted specimen  and  confirmed on repeat testing.  While 2019 novel coronavirus  (SARS-CoV-2) nucleic acids may be present in the submitted sample  additional confirmatory testing may be necessary for epidemiological  and / or clinical management purposes  to differentiate between  SARS-CoV-2 and other Sarbecovirus currently known to infect humans.  If clinically indicated additional testing with an alternate test  methodology [phone]) is advised. The SARS-CoV-2 RNA is generally  detectable in upper and lower respiratory sp ecimens during the acute  phase of infection. The expected result is Negative. Fact Sheet for Patients:  StrictlyIdeas.no Fact Sheet for Healthcare Providers: BankingDealers.co.za This test is not yet approved or cleared by the [place] FDA and has been authorized for detection and/or diagnosis of SARS-CoV-2 by FDA under an Emergency Use Authorization (EUA).  This EUA will remain in effect (meaning this test can be used) for the duration of the COVID-19 declaration under Section 564(b)(1) of the Act, 21 U.S.C. section 360bbb-3(b)(1), unless the authorization is terminated or revoked sooner. Performed at [facility], [place] [address], [place],  [address]   MRSA PCR Screening     Status: None   Collection Time: 05/19/18 10:15 AM  Result Value Ref Range   MRSA by PCR NEGATIVE NEGATIVE  Comment:        The GeneXpert MRSA Assay (FDA approved for NASAL specimens only), is one component of a comprehensive MRSA colonization surveillance program. It is not intended to diagnose MRSA infection nor to guide or monitor treatment for MRSA infections. Performed at [facility], [place] [address] [place], [place] [address]   I-STAT 7, (LYTES, BLD GAS, ICA, H+H)     Status: Abnormal   Collection Time: 05/19/18  1:09 PM  Result Value Ref Range   pH, Arterial 7.043 (LL) 7.350 - 7.450   pCO2 arterial 35.3 32.0 - 48.0 mmHg    pO2, Arterial 74.0 (L) 83.0 - 108.0 mmHg   Bicarbonate 10.4 (L) 20.0 - 28.0 mmol/L   TCO2 12 (L) 22 - 32 mmol/L   O2 Saturation 92.0 %   Acid-base deficit 20.0 (H) 0.0 - 2.0 mmol/L   Sodium 141 135 - 145 mmol/L   Potassium 4.7 3.5 - 5.1 mmol/L   Calcium, Ion 1.13 (L) 1.15 - 1.40 mmol/L   HCT 20.0 (L) 39.0 - 52.0 %   Hemoglobin 6.8 (LL) 13.0 - 17.0 g/dL   Patient temperature 90.0 F    Collection site RADIAL, ALLEN'S TEST ACCEPTABLE    Drawn by Operator    Sample type ARTERIAL    Comment NOTIFIED PHYSICIAN   Protime-INR     Status: Abnormal   Collection Time: 05/19/18  1:39 PM  Result Value Ref Range   Prothrombin Time 18.7 (H) 11.4 - 15.2 seconds   INR 1.6 (H) 0.8 - 1.2    Comment: (NOTE) INR goal varies based on device and disease states. Performed at [facility], [place] [address] [place], [place] [address]   APTT     Status: Abnormal   Collection Time: 05/19/18  1:39 PM  Result Value Ref Range   aPTT 47 (H) 24 - 36 seconds    Comment:        IF BASELINE aPTT IS ELEVATED, SUGGEST PATIENT RISK ASSESSMENT BE USED TO DETERMINE APPROPRIATE ANTICOAGULANT THERAPY. Performed at [facility], [place] [address], [place],  [address]    Dg Chest Port 1 View  Result Date: 05/19/2018 CLINICAL DATA:  Increased creatinine and ammonia levels. Surge speech. EXAM: PORTABLE CHEST 1 VIEW COMPARISON:  May 16, 2018 FINDINGS: Stable cardiomegaly. Mild pulmonary venous congestion without overt edema. No focal infiltrate. No pneumothorax. IMPRESSION: Cardiomegaly and pulmonary venous congestion. Electronically Signed   By: Dorise Bullion III M.D   On: 05/19/2018 10:34    Pending Labs Unresulted Labs (From admission, onward)    Start     Ordered   05/20/18 0500  CBC  Tomorrow morning,   R     05/19/18 1447   05/19/18 8413  Basic metabolic panel  Once,   R     05/19/18 1440   05/19/18 1254  Blood gas, arterial (at [facility])  ONCE - STAT,   STAT     05/19/18 1253   05/19/18  1004  Rapid urine drug screen (hospital performed)  Add-on,   R     05/19/18 1003   05/19/18 0941  Blood Culture (routine x 2)  BLOOD CULTURE X 2,   STAT     05/19/18 0941   05/19/18 0941  Urine culture  ONCE - STAT,   STAT     05/19/18 0941          Vitals/Pain Today's Vitals   05/19/18 1143 05/19/18 1200 05/19/18 1322  05/19/18 1421  BP: (!) 91/51 (!) 96/58 111/71 (!) 117/91  Pulse: (!) 56 (!) 53 63 66  Resp: 11 11 16 10   Temp: (!) 90 F (32.2 C)   (!) 92.5 F (33.6 C)  TempSrc: Rectal     SpO2: 99% 95% 97% 95%  Weight:      Height:      PainSc:        Isolation Precautions No active isolations  Medications Medications  ceFEPIme (MAXIPIME) 1 g in sodium chloride 0.9 % 100 mL IVPB (has no administration in time range)  vancomycin variable dose per unstable renal function (pharmacist dosing) (has no administration in time range)  sodium bicarbonate 150 mEq in dextrose 5 % 1,000 mL infusion (has no administration in time range)  lactulose (CHRONULAC) 10 GM/15ML solution 10 g (has no administration in time range)  insulin aspart (novoLOG) injection 1-3 Units (has no administration in time range)  sodium chloride 0.9 % bolus 500 mL (0 mLs Intravenous Stopped 05/19/18 1111)  ceFEPIme (MAXIPIME) 2 g in sodium chloride 0.9 % 100 mL IVPB (0 g Intravenous Stopped 05/19/18 1111)  vancomycin (VANCOCIN) 2,000 mg in sodium chloride 0.9 % 500 mL IVPB (0 mg Intravenous Stopped 05/19/18 1422)  sodium bicarbonate injection 50 mEq (50 mEq Intravenous Given 05/19/18 1349)    Mobility non-ambulatory High fall risk   Focused Assessments Renal Assessment Handoff:      R Recommendations: See Admitting Provider Note  Report given to:   Additional Notes:

## 2018-05-19 NOTE — Progress Notes (Signed)
History and Physical    Eddie Davies BTY:[id number] DOB: 1970-08-04 DOA: 05/19/2018  [facility]P: [facility]:  Olivia Mackie - nephrology; Driscilla Moats - orthopedics; Posey Pronto - endocrinology Patient coming from: [facility]; [facility]:   Chief Complaint: AMS  HPI: Eddie Davies is a 48 y.o. male with medical history significant of CKD stage 3, presumed liver cirrhosis, hypertension, seizures presenting with AMS.    He was hospitalized from 3/8-21 and 3/28-4/11 for acute metabolic encephalopathy secondary to septic shock from ESBL positive Klebsiella and E faecalis UTI with subsequent hypoxic respiratory failure in the setting of ESBL PNA, treated with meropenem and requiring mechanical ventilation and CRRT during hospitalizations.   ED Course:   Encephalopathy, UTI, AKI.  Complex patient, recently discharged.  Worsening confusion, elevated creatinine and NH4 levels.  Hypotensive on arrival, rectal temp 88, transiently hypoxic. Labs with UTI.  Creatinine up to 8.11, BUN 64.  Hgb is low but stable.  CXR with vascular congestion, mild.  Pharmacy suggested Cefepime and Vanc was reasonable.  MRSA PCR is pending.  Given mild IVF.  BP 91/51.  Has not given sepsis bolus.  PCCM to evaluate to determine if admission to their service is needed and she will let me know.  Ambulatory Status:  Mostly wheelchair-bound  Past Medical History:  Diagnosis Date  . Liver failure ([place])   . Renal failure     History reviewed. No pertinent surgical history.  Social History   Socioeconomic History  . Marital status: Single    Spouse name: Not on file  . Number of children: Not on file  . Years of education: Not on file  . Highest education level: Not on file  Occupational History  . Not on file  Social Needs  . Financial resource strain: Not on file  . Food insecurity:    Worry: Not on file    Inability: Not on file  . Transportation needs:    Medical: Not on file   Non-medical: Not on file  Tobacco Use  . Smoking status: Never Smoker  . Smokeless tobacco: Never Used  Substance and Sexual Activity  . Alcohol use: Not on file  . Drug use: Not on file  . Sexual activity: Not on file  Lifestyle  . Physical activity:    Days per week: Not on file    Minutes per session: Not on file  . Stress: Not on file  Relationships  . Social connections:    Talks on phone: Not on file    Gets together: Not on file    Attends religious service: Not on file    Active member of club or organization: Not on file    Attends meetings of clubs or organizations: Not on file    Relationship status: Not on file  . Intimate partner violence:    Fear of current or ex partner: Not on file    Emotionally abused: Not on file    Physically abused: Not on file    Forced sexual activity: Not on file  Other Topics Concern  . Not on file  Social History Narrative  . Not on file    Allergies  Allergen Reactions  . Fish Oil Swelling and Other (See Comments)         History reviewed. No pertinent family history.  Prior to Admission medications   Medication Sig Start Date End Date Taking? Authorizing Provider  acetaminophen (TYLENOL) 325 MG tablet Take 650 mg by mouth every 8 (  eight) hours as needed for fever (pain).    [provider]  amLODipine (NORVASC) 5 MG tablet Take 5 mg by mouth daily.     [provider]  carbamazepine (TEGRETOL) 200 MG tablet Take 600 mg by mouth 2 (two) times daily.    [provider]  Cholecalciferol (VITAMIN D) 50 MCG (2000 UT) tablet Take 2,000 Units by mouth daily.    [provider]  gabapentin (NEURONTIN) 100 MG capsule Take 200 mg by mouth 3 (three) times daily.     [provider]  guaiFENesin (MUCINEX) 600 MG 12 hr tablet Take 600 mg by mouth every 12 (twelve) hours as needed for cough (congestion).    [provider]  hydrocerin (EUCERIN) CREA Apply 1 application topically 2  (two) times daily. Apply to body 04/30/18   [name], [name], MD  insulin aspart (NOVOLOG) 100 UNIT/ML injection 0-15 Units, Subcutaneous, 3 times daily with meals CBG < 70: implement hypoglycemia protocol CBG 70 - 120: 0 units CBG 121 - 150: 2 units CBG 151 - 200: 3 units CBG 201 - 250: 5 units CBG 251 - 300: 8 units CBG 301 - 350: 11 units CBG 351 - 400: 15 units CBG > 400: call MD 04/30/18   [name], MD  Insulin Glargine (BASAGLAR KWIKPEN) 100 UNIT/ML SOPN Inject 17 Units into the skin 2 (two) times daily.     [provider]  lactulose, encephalopathy, (CHRONULAC) 10 GM/15ML SOLN Take 45 mLs (30 g total) by mouth 2 (two) times daily. 04/30/18   [name], [name], MD  linagliptin (TRADJENTA) 5 MG TABS tablet Take 5 mg by mouth daily.    [provider]  magnesium oxide (MAG-OX) 400 MG tablet Take 400 mg by mouth daily.    [provider]  multivitamin (RENA-VIT) TABS tablet Take 1 tablet by mouth at bedtime. 04/30/18   [name], [name], MD  Nutritional Supplements (FEEDING SUPPLEMENT, NEPRO CARB STEADY,) LIQD Take 237 mLs by mouth 2 (two) times daily between meals. 04/30/18   [name], [name], MD  omeprazole (PRILOSEC OTC) 20 MG tablet Take 40 mg by mouth daily.    [provider]  ondansetron (ZOFRAN-ODT) 4 MG disintegrating tablet Take 4 mg by mouth every 6 (six) hours as needed for nausea or vomiting.    [provider]  PHENobarbital (LUMINAL) 32.4 MG tablet Take 32.4 mg by mouth at bedtime. Take with #3  64.8 mg tablets for a total dose of 226.8 mg    [provider]  PHENobarbital (LUMINAL) 64.8 MG tablet Take 194.4 mg by mouth at bedtime. Take with a 32.4 mg tablet for a total dose of 226.8 mg    [provider]  rifaximin (XIFAXAN) 550 MG TABS tablet Take 550 mg by mouth 2 (two) times daily. For diarrhea    [provider]  simvastatin (ZOCOR) 20 MG tablet Take 20 mg by mouth at bedtime.     [provider]  sodium bicarbonate 650 MG tablet Take 2 tablets (1,300 mg total) by mouth 3 (three) times daily. 04/30/18   [name], [name], MD  tamsulosin (FLOMAX) 0.4 MG CAPS capsule Take 0.4 mg by mouth at bedtime.    [provider]    Physical Exam: Vitals:   05/19/18 0934 05/19/18 0945 05/19/18 1000 05/19/18 1015  BP:  103/60 105/66 (!) 103/58  Pulse:  (!) 54 (!) 58 (!) 57  Resp:  14 16 14   Temp: Marland Kitchen)  88.5 F (31.4 C)     TempSrc: Rectal     SpO2:  98% 99% 99%  Weight:      Height:         Radiological Exams on Admission: Dg Chest Port 1 View  Result Date: 05/19/2018 CLINICAL DATA:  Increased creatinine and ammonia levels. Surge speech. EXAM: PORTABLE CHEST 1 VIEW COMPARISON:  May 16, 2018 FINDINGS: Stable cardiomegaly. Mild pulmonary venous congestion without overt edema. No focal infiltrate. No pneumothorax. IMPRESSION: Cardiomegaly and pulmonary venous congestion. Electronically Signed   By: Dorise Bullion III M.D   On: 05/19/2018 10:34    Based on description from ED PA, this patient appears to be in septic shock with unstable BP.  I have requested that PCCM consult on the patient for possible ICU admission.  If not appropriate for ICU admission, [facility] will be happy to be reconsulted for admission.     Karmen Bongo MD [facility]   How to contact the [facility] Attending or Consulting provider [place] or covering provider during after hours [place], for this patient?  1. Check the care team in [facility] and look for a) attending/consulting [facility] provider listed and b) the [facility] team listed 2. Log into [email or web address] and use [place]'s universal password to access. If you do not have the password, please contact the hospital operator. 3. Locate the [facility] provider you are looking for under [facility] and page to a number that you can be directly reached. 4. If you still have difficulty reaching the provider, please page the [facility] (Director on Call) for the  Hospitalists listed on amion for assistance.   05/19/2018, 11:36 AM

## 2018-05-19 NOTE — Consult Note (Addendum)
[facility] Renal Consultation Note  Requesting MD: Bero Indication for Consultation:  AKI, acidosis  Chief complaint: altered mental status  HPI:  Eddie Davies is a 48 y.o. male with a history of cirrhosis and chronic kidney disease who presented to the hospital from his skilled nursing facility today with altered mental status and with report of abnormal labs.  His creatinine and ammonia were elevated on labs obtained 4/24 at his facility.  Creatinine was 7.41 and ammonia was 85.  He was recently discharged from [facility] after an admission with pneumonia during which time he required intubated.  At that time he also had AKI felt related to ischemic ATN and he actually required CRRT.  Critical care has evaluated him and is going to admit him to the ICU for closer monitoring.  He had a COVID swab which was negative.  A Foley was placed in the ER at my request.  Earlier he had an in and out cath with 200 ml of urine.  Power of attorney is Eddie Davies at [phone].  His facility is [facility] at [phone].  He previously lived with Eddie Davies before going to the SNF - he is a cousin.  He has been making medical decisions for him.  The last time he spoke with the patient about his goals of care during the last admission, he had decided that he would want to do dialysis.  Harder to talk with the pandemic and he actually hasn't seen him in a while 2/2 same.  Spoke with his nurse at the SNF.  They have sent him out to the hospital a lot recently.  To his nurse's understanding, they have talked to him multiple times about dialysis in the past but he has always refused.  He has been eating ok recently per his nurse.  He has been incontinent and has had 1-2 voids per shift - he also has loose stools.  No vomiting.  She denies fever or hypothermia; temp was reported around 98 there today this AM.  Creatinine, Ser  Date/Time Value Ref Range Status  05/19/2018 09:36 AM  8.11 (H) 0.61 - 1.24 mg/dL Final  05/03/2018 02:38 AM 1.86 (H) 0.61 - 1.24 mg/dL Final  04/30/2018 03:45 AM 1.87 (H) 0.61 - 1.24 mg/dL Final  04/29/2018 05:16 AM 2.02 (H) 0.61 - 1.24 mg/dL Final  04/28/2018 03:15 AM 2.21 (H) 0.61 - 1.24 mg/dL Final  04/27/2018 09:25 AM 2.65 (H) 0.61 - 1.24 mg/dL Final  04/26/2018 03:16 AM 3.26 (H) 0.61 - 1.24 mg/dL Final  04/25/2018 04:15 PM 3.48 (H) 0.61 - 1.24 mg/dL Final  04/25/2018 06:31 AM 3.86 (H) 0.61 - 1.24 mg/dL Final  04/24/2018 02:58 AM 4.51 (H) 0.61 - 1.24 mg/dL Final  04/23/2018 05:24 PM 4.95 (H) 0.61 - 1.24 mg/dL Final  04/23/2018 04:35 AM 5.70 (H) 0.61 - 1.24 mg/dL Final  04/22/2018 06:02 PM 6.39 (H) 0.61 - 1.24 mg/dL Final  04/21/2018 10:09 PM 6.90 (H) 0.61 - 1.24 mg/dL Final  04/14/2018 04:20 AM 1.94 (H) 0.61 - 1.24 mg/dL Final  04/13/2018 04:54 AM 1.91 (H) 0.61 - 1.24 mg/dL Final  04/12/2018 05:20 AM 1.95 (H) 0.61 - 1.24 mg/dL Final  04/11/2018 02:55 AM 1.64 (H) 0.61 - 1.24 mg/dL Final  04/10/2018 04:44 PM 1.87 (H) 0.61 - 1.24 mg/dL Final  04/10/2018 02:29 AM 2.71 (H) 0.61 - 1.24 mg/dL Final  04/09/2018 04:13 PM 3.51 (H) 0.61 - 1.24 mg/dL Final  04/09/2018 03:28 AM 4.91 (H) 0.61 -  1.24 mg/dL Final  04/08/2018 05:05 PM 6.09 (H) 0.61 - 1.24 mg/dL Final  04/08/2018 02:07 AM 7.47 (H) 0.61 - 1.24 mg/dL Final  04/07/2018 07:48 PM 7.45 (H) 0.61 - 1.24 mg/dL Final    PMHx:   Past Medical History:  Diagnosis Date  . Liver failure ([place])   . Renal failure     Family Hx: unable to obtain 2/2 AMS   Social History: unable to obtain 2/2 AMS   Allergies:  Allergies  Allergen Reactions  . Fish Oil Swelling and Other (See Comments)         Medications: Prior to Admission medications   Medication Sig Start Date End Date Taking? Authorizing Provider  acetaminophen (TYLENOL) 325 MG tablet Take 650 mg by mouth every 8 (eight) hours as needed for fever (pain).   Yes [provider]  amLODipine (NORVASC) 5 MG tablet Take 5 mg by  mouth daily.    Yes [provider]  carbamazepine (TEGRETOL) 100 MG chewable tablet Chew 100 mg by mouth 2 (two) times daily. Take with 600 mg for a total dose of 700 mg.   Yes [provider]  carbamazepine (TEGRETOL) 200 MG tablet Take 600 mg by mouth 2 (two) times daily.   Yes [provider]  ferrous sulfate 325 (65 FE) MG tablet Take 325 mg by mouth daily with breakfast.   Yes [provider]  guaiFENesin (MUCINEX) 600 MG 12 hr tablet Take 600 mg by mouth every 12 (twelve) hours as needed for cough (congestion).   Yes [provider]  hydrocerin (EUCERIN) CREA Apply 1 application topically 2 (two) times daily. Apply to body 04/30/18  Yes [name], [name], MD  Insulin Glargine (BASAGLAR KWIKPEN) 100 UNIT/ML SOPN Inject 17 Units into the skin 2 (two) times daily.    Yes [provider]  lactulose, encephalopathy, (CHRONULAC) 10 GM/15ML SOLN Take 45 mLs (30 g total) by mouth 2 (two) times daily. Patient taking differently: Take 30 g by mouth 4 (four) times daily.  04/30/18  Yes [name], [name], MD  linagliptin (TRADJENTA) 5 MG TABS tablet Take 5 mg by mouth daily.   Yes [provider]  multivitamin (RENA-VIT) TABS tablet Take 1 tablet by mouth at bedtime. 04/30/18  Yes [name], [name], MD  Nutritional Supplements (FEEDING SUPPLEMENT, NEPRO CARB STEADY,) LIQD Take 237 mLs by mouth 2 (two) times daily between meals. 04/30/18  Yes [name], [name], MD  omeprazole (PRILOSEC OTC) 20 MG tablet Take 20 mg by mouth daily.    Yes [provider]  PHENobarbital (LUMINAL) 32.4 MG tablet Take 32.4 mg by mouth at bedtime. Take with #3  64.8 mg tablets for a total dose of 226.8 mg   Yes [provider]  PHENobarbital (LUMINAL) 64.8 MG tablet Take 194.4 mg by mouth at bedtime. Take with a 32.4 mg tablet for a total dose of 226.8 mg   Yes [provider]  rifaximin (XIFAXAN) 550 MG TABS tablet Take 550 mg by mouth 2 (two)  times daily. For diarrhea   Yes [provider]  simvastatin (ZOCOR) 20 MG tablet Take 20 mg by mouth at bedtime.    Yes [provider]  sodium bicarbonate 650 MG tablet Take 2 tablets (1,300 mg total) by mouth 3 (three) times daily. 04/30/18  Yes [name], [name], MD  tamsulosin (FLOMAX) 0.4 MG CAPS capsule Take 0.4 mg by mouth at bedtime.   Yes [provider]  Cholecalciferol (VITAMIN D) 50  MCG (2000 UT) tablet Take 2,000 Units by mouth daily.    [provider]  gabapentin (NEURONTIN) 100 MG capsule Take 100 mg by mouth 3 (three) times daily.     [provider]  insulin aspart (NOVOLOG) 100 UNIT/ML injection 0-15 Units, Subcutaneous, 3 times daily with meals CBG < 70: implement hypoglycemia protocol CBG 70 - 120: 0 units CBG 121 - 150: 2 units CBG 151 - 200: 3 units CBG 201 - 250: 5 units CBG 251 - 300: 8 units CBG 301 - 350: 11 units CBG 351 - 400: 15 units CBG > 400: call MD 04/30/18   [name], MD    I have reviewed the patient's current medications.  Labs:  BMP Latest Ref Rng & Units 05/19/2018 05/19/2018 05/03/2018  Glucose 70 - 99 mg/dL - 182(H) 232(H)  BUN 6 - 20 mg/dL - 64(H) 43(H)  Creatinine 0.61 - 1.24 mg/dL - 8.11(H) 1.86(H)  Sodium 135 - 145 mmol/L 141 140 143  Potassium 3.5 - 5.1 mmol/L 4.7 4.8 3.9  Chloride 98 - 111 mmol/L - 116(H) 119(H)  CO2 22 - 32 mmol/L - 9(L) 15(L)  Calcium 8.9 - 10.3 mg/dL - 7.3(L) 8.1(L)    Urinalysis    Component Value Date/Time   COLORURINE AMBER (A) 05/19/2018 0941   APPEARANCEUR HAZY (A) 05/19/2018 0941   LABSPEC 1.014 05/19/2018 0941   PHURINE 5.0 05/19/2018 0941   GLUCOSEU 50 (A) 05/19/2018 0941   HGBUR SMALL (A) 05/19/2018 0941   BILIRUBINUR NEGATIVE 05/19/2018 0941   KETONESUR NEGATIVE 05/19/2018 0941   PROTEINUR 30 (A) 05/19/2018 0941   NITRITE NEGATIVE 05/19/2018 0941   LEUKOCYTESUR MODERATE (A) 05/19/2018 0941    ROS:  Unable to obtain secondary to altered mental  status   Physical Exam: Vitals:   05/19/18 1322 05/19/18 1421  BP: 111/71 (!) 117/91  Pulse: 63 66  Resp: 16 10  Temp:  (!) 92.5 F (33.6 C)  SpO2: 97% 95%     General: adult male in stretcher agitated  HEENT: NCAT  Eyes: EOMI Neck:increased neck circumference  Heart: RRR; no rub; no murmur Lungs: clear to auscultation bilaterally  Abdomen: obese, distended abdomen is non-tender Extremities: no pitting edema lower extremities ; no cyanosis Skin: dry skin lower extremities Neuro: patient responds to questions but is agitated.  follows commands GU foley catheter in place   Assessment/Plan:  # AKI - Multifactorial secondary to pre-renal and cannot r/o obstruction.  Clinically dry appearing.  Note hx reported CKD and recent AKI with decreased renal reserve.  Note cr down to 1.9 at last discharge on 05/03/2018.  - Place Foley  - normal saline 500 mL bolus once now  - CT a/p without contrast - Start bicarb gtt as below  - Update BMP now - last labs 9:36 AM - Trial of medical management as he needs fluids and foley regardless and based on lab trends may ultimately require dialysis initiation.  Per his family he would want hemodialysis if it were indicated   # Metabolic acidosis - Felt secondary to AKI  - Starting a bicarb gtt and s/p bicarb amp - Repeating BMP   # AMS - Multifactorial, secondary in part to AKI and acidosis - Correct metabolic insults as above - Hold sedating mediations ie. gabapentin  # Hypothermia  - broad spectrum ABX in ER - vanc and cefepime.  Normotensive - warming blanket is on   # Normocytic Anemia  - Would transfuse now as Hb under  7 - Transfusions per primary team discretion   # Cirrhosis  - Complicated by pancytopenia.  Hx hepatic encephalopathy - Supportive care   Claudia Desanctis 05/19/2018, 2:48 PM

## 2018-05-19 NOTE — Plan of Care (Signed)
Reviewed repeat labs.  After initially improving to 5.81 his creatinine is now back at 8.34.  Patient with persistent acidosis and anuric despite bicarbonate gtt.  Worsening mental status and BP 93/44.  Spoke with Dr. Rennis Harding in critical care and they are able to get a vascath.  Will initiate CRRT to correct acidosis.  BMP Latest Ref Rng & Units 05/19/2018 05/19/2018 05/19/2018  Glucose 70 - 99 mg/dL 181(H) 140(H) -  BUN 6 - 20 mg/dL 65(H) 48(H) -  Creatinine 0.61 - 1.24 mg/dL 8.34(H) 5.81(H) -  Sodium 135 - 145 mmol/L 139 144 141  Potassium 3.5 - 5.1 mmol/L 4.6 3.2(L) 4.7  Chloride 98 - 111 mmol/L 114(H) 129(H) -  CO2 22 - 32 mmol/L 11(L) 7(L) -  Calcium 8.9 - 10.3 mg/dL 6.9(L) 5.0(LL) -   Spoke with primary team - patient with worsening anemia and will need PRBC's.  Ordered PRBC's.  Mailbox is full on his sister's voicemail and she is now listed as his contact.  I spoke again with his cousin [name] who has been making his medical decisions - he provided consent for blood transfusion.  We discussed the risks benefits, and indications for blood product administration and he does want to proceed.  Reviewed CT read.  Bilateral nephrolithiasis with 1.4 cm stone in the right renal pelvis.  Spoke with radiology via phone and they clarified that there is no hydronephrosis.    Will also check lipase  Appreciate pulmonology/critical care   Harrie Jeans, MD

## 2018-05-19 NOTE — Progress Notes (Signed)
Pharmacy Antibiotic Note  Eddie Davies is a 48 y.o. male admitted on 05/19/2018 with sepsis secondary to pneumonia. Pharmacy has been consulted for vancomycin and cefepime dosing. 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   Lac 1.3, Scr 8.11, CrCl ~15, WBC 4.6, afebrile  Plan:  Vancomycin 2067m IV x1   Due to patients current renal function will draw levels in 24-48hr for follow up doses  Cefepime 2g x1 then 1g Q24H  Monitor Cxs, clinical status, renal function,  F/u LOT/de-escalation, and vanc levels prn   Height: 6' 1"  (185.4 cm) Weight: 268 lb 15.4 oz (122 kg) IBW/kg (Calculated) : 79.9  Temp (24hrs), Avg:88.5 F (31.4 C), Min:88.5 F (31.4 C), Max:88.5 F (31.4 C)  No results for input(s): WBC, CREATININE, LATICACIDVEN, VANCOTROUGH, VANCOPEAK, VANCORANDOM, GENTTROUGH, GENTPEAK, GENTRANDOM, TOBRATROUGH, TOBRAPEAK, TOBRARND, AMIKACINPEAK, AMIKACINTROU, AMIKACIN in the last 168 hours.  Estimated Creatinine Clearance: 67.2 mL/min (A) (by C-G formula based on SCr of 1.86 mg/dL (H)).    Allergies  Allergen Reactions  . Fish Oil Swelling and Other (See Comments)         Antimicrobials this admission: Vanc 4/25 >>  Cefepime 4/25 >>   Dose adjustments this admission:   Microbiology results:  4/25 BCx:   4/25 UCx:    4/25 COVID:   4/25 MRSA PCR:   Thank you for allowing pharmacy to be a part of this patient's care.  CHarrietta Guardian PharmD PGY1 Pharmacy Resident 05/19/2018    10:11 AM Please check AMION for all MGrahamnumbers

## 2018-05-19 NOTE — ED Triage Notes (Signed)
Pt BIB [facility] from [place] with complaint of increased creatinine and ammonia levels. Pt altered with slurred  Speech.

## 2018-05-19 NOTE — H&P (Signed)
PULMONARY / CRITICAL CARE MEDICINE   NAME:  Eddie Davies, MRN:  [id number], DOB:  06-09-1970, LOS: 0 ADMISSION DATE:  05/19/2018, CONSULTATION DATE:  05/19/2018 REFERRING MD:  Delfina Redwood, CHIEF COMPLAINT:  Hypotension with confusion and acidemia  BRIEF HISTORY:    48 year old male with history of poorly-controlled DM, seizures, HTN, ESLD, CKD stage 3 who presented to the ED in transfer from a SNF with altered MS, acute on CKD stage 3 with acidemia, pH 7.08. HISTORY OF PRESENT ILLNESS   This is a 48 year old male with history of poorly-controlled DM, seizures, HTN, ESLD, CKD stage 3 who presented to the ED in transfer from a SNF in [facility]. The patient is unable to provide a reliable history but what has been gleaned is that on the day of admission he was noted to have an altered MS. Labs were drawn at the SNF that reportedly revealed elevated creatinine and NH3 levels. Upon presentation to the ED initial VS were BP 107/62, P 58 and T 88.5 rectally. His lowest recorded BP in the ED was 91/51 (MAP 64). He has maintained an adequate O2 saturation on RA. Initial labs revealed a BUN/Cr 64/8.11 and NH3 of 48.  AG of 15 and lactate of 1.3. WBC 4.6. PCCM was consulted for AMS and soft BPs. He received cefipime + vanc in ED for presumed sepsis. RA ABGs: 92/74/35/7.08 SIGNIFICANT PAST MEDICAL HISTORY   The patient was recently hospitalized at [facility] from 3/28-4/11/2018 after presenting as a transfer from [facility] for [place] requiring intubation.  The patient had been admitted the week prior (3/14-3/21) for encephalopathy with subsequent intubation.  He required abx for complicated UTI, CRRT for dialysis while hypotensive, lactulose, rifaximin for ESLD.  Patitent was d/c to [place] but returned after refusing dialsysis and medications for ESLD.  He had an altered MS in ED and was intubated for airway protection.  Head CT was without acute pathology and his ammonia level was 33.  His hospital course  included treatment with meropenem for ESBL Klebsiella pneumonia, AKI on CKD stage III that did not require RRT, metabolic encephalopathy with his ESLD managed with rifaximin and lactulose. He was extubated on 3/30. BUN/Cr prior to discharge was 43/1.86  STUDIES:   [facility] 4/25 personally reviewed: Cardiomegaly without acute infiltrates. I'm not impressed with pulm vasc, congestion as the lungs are underinflated. CULTURES:  Urine 4/25 Blood 4/25  ANTIBIOTICS:  Cefipime 4/25 Vanc 4/25  LINES/TUBES:   Foley to be placed CONSULTANTS:  Nephrol SUBJECTIVE:  C/o pain on his backside  CONSTITUTIONAL: BP 111/71   Pulse 63   Temp (!) 90 F (32.2 C) (Rectal)   Resp 16   Ht 6' 1"  (1.854 m)   Wt 122 kg   SpO2 97%   BMI 35.49 kg/m   No intake/output data recorded.        PHYSICAL EXAM: General:  Obese W M in NARD Neuro:  Confused and disoriented. Moves all 4s with good strength HEENT:  [place]/AT, PRRL, EOMI  Cardiovascular:  RRR, no m/r/g Lungs:  Clear bilaterally Abdomen:  Protuberant. No tenderness. Unable to appreciate any organomegaly Musculoskeletal:  No active joints Skin:  No c/c/e  RESOLVED PROBLEM LIST   ASSESSMENT AND PLAN   1) AMS Possibly related to AKI on CKD. However, cannot r/o early sepsis. Current Abx adequate and will f/u on cultures. Doubt primary CNS pathology will hold off of head CT for now. 2) Hemodynamic lability Currently not hypotensive and does  not appear to be in fluid overload. Follow Bps and institute pressors if required. 3) AKI on CKD stage 3. Nephrol has seen and will attempt to manage conservatively with HCO3- drip  And following U/O. 4) Possible sepsis Cont current Abx and f/u on cultures 5) ESLD with elevated ammonia level Continue lactulose and rifaximin 6) Poorly controlled DM SSI  SUMMARY OF TODAY'S PLAN:  Admit to ICU Bicarb drip  Best Practice / Goals of Care / Disposition.   DVT  PROPHYLAXIS:SCD SUP:SSI NUTRITION:NPO MOBILITY:BR GOALS OF CARE:Correction of acidemia and management of AKI FAMILY DISCUSSIONS: None DISPOSITION ICU  LABS  Glucose Recent Labs  Lab 05/19/18 0926  GLUCAP 158*    BMET Recent Labs  Lab 05/19/18 0936 05/19/18 1309  NA 140 141  K 4.8 4.7  CL 116*  --   CO2 9*  --   BUN 64*  --   CREATININE 8.11*  --   GLUCOSE 182*  --     Liver Enzymes Recent Labs  Lab 05/19/18 0936  AST 12*  ALT 16  ALKPHOS 140*  BILITOT 0.5  ALBUMIN 2.8*    Electrolytes Recent Labs  Lab 05/19/18 0936  CALCIUM 7.3*    CBC Recent Labs  Lab 05/19/18 0936 05/19/18 1309  WBC 4.6  --   HGB 7.2* 6.8*  HCT 23.8* 20.0*  PLT 64*  --     ABG Recent Labs  Lab 05/19/18 1309  PHART 7.043*  PCO2ART 35.3  PO2ART 74.0*    Coag's No results for input(s): APTT, INR in the last 168 hours.  Sepsis Markers Recent Labs  Lab 05/19/18 0941  LATICACIDVEN 1.3    Cardiac Enzymes No results for input(s): TROPONINI, PROBNP in the last 168 hours.  PAST MEDICAL HISTORY :   He  has a past medical history of Liver failure ([place]) and Renal failure.  PAST SURGICAL HISTORY:  He  has no past surgical history on file.  Allergies  Allergen Reactions  . Fish Oil Swelling and Other (See Comments)         No current facility-administered medications on file prior to encounter.    Current Outpatient Medications on File Prior to Encounter  Medication Sig  . acetaminophen (TYLENOL) 325 MG tablet Take 650 mg by mouth every 8 (eight) hours as needed for fever (pain).  Marland Kitchen amLODipine (NORVASC) 5 MG tablet Take 5 mg by mouth daily.   . carbamazepine (TEGRETOL) 200 MG tablet Take 600 mg by mouth 2 (two) times daily.  . Cholecalciferol (VITAMIN D) 50 MCG (2000 UT) tablet Take 2,000 Units by mouth daily.  Marland Kitchen gabapentin (NEURONTIN) 100 MG capsule Take 200 mg by mouth 3 (three) times daily.   Marland Kitchen guaiFENesin (MUCINEX) 600 MG 12 hr tablet Take 600 mg by mouth  every 12 (twelve) hours as needed for cough (congestion).  . hydrocerin (EUCERIN) CREA Apply 1 application topically 2 (two) times daily. Apply to body  . insulin aspart (NOVOLOG) 100 UNIT/ML injection 0-15 Units, Subcutaneous, 3 times daily with meals CBG < 70: implement hypoglycemia protocol CBG 70 - 120: 0 units CBG 121 - 150: 2 units CBG 151 - 200: 3 units CBG 201 - 250: 5 units CBG 251 - 300: 8 units CBG 301 - 350: 11 units CBG 351 - 400: 15 units CBG > 400: call MD  . Insulin Glargine (BASAGLAR KWIKPEN) 100 UNIT/ML SOPN Inject 17 Units into the skin 2 (two) times daily.   Marland Kitchen lactulose, encephalopathy, (CHRONULAC) 10 GM/15ML SOLN  Take 45 mLs (30 g total) by mouth 2 (two) times daily.  Marland Kitchen linagliptin (TRADJENTA) 5 MG TABS tablet Take 5 mg by mouth daily.  . magnesium oxide (MAG-OX) 400 MG tablet Take 400 mg by mouth daily.  . multivitamin (RENA-VIT) TABS tablet Take 1 tablet by mouth at bedtime.  . Nutritional Supplements (FEEDING SUPPLEMENT, NEPRO CARB STEADY,) LIQD Take 237 mLs by mouth 2 (two) times daily between meals.  Marland Kitchen omeprazole (PRILOSEC OTC) 20 MG tablet Take 40 mg by mouth daily.  . ondansetron (ZOFRAN-ODT) 4 MG disintegrating tablet Take 4 mg by mouth every 6 (six) hours as needed for nausea or vomiting.  Marland Kitchen PHENobarbital (LUMINAL) 32.4 MG tablet Take 32.4 mg by mouth at bedtime. Take with #3  64.8 mg tablets for a total dose of 226.8 mg  . PHENobarbital (LUMINAL) 64.8 MG tablet Take 194.4 mg by mouth at bedtime. Take with a 32.4 mg tablet for a total dose of 226.8 mg  . rifaximin (XIFAXAN) 550 MG TABS tablet Take 550 mg by mouth 2 (two) times daily. For diarrhea  . simvastatin (ZOCOR) 20 MG tablet Take 20 mg by mouth at bedtime.   . sodium bicarbonate 650 MG tablet Take 2 tablets (1,300 mg total) by mouth 3 (three) times daily.  . tamsulosin (FLOMAX) 0.4 MG CAPS capsule Take 0.4 mg by mouth at bedtime.    FAMILY HISTORY:   His family history is not on file.  SOCIAL  HISTORY:  He  reports that he has never smoked. He has never used smokeless tobacco.  REVIEW OF SYSTEMS:    Unobtainable

## 2018-05-19 NOTE — ED Notes (Signed)
Helped get patient cleaned up

## 2018-05-19 NOTE — ED Provider Notes (Addendum)
[facility] DEPARTMENT Provider Note   CSN: [id number] Arrival date & time: 05/19/18  8841    History   Chief Complaint Chief Complaint  Patient presents with  . Altered Mental Status    HPI Eddie Davies is a 48 y.o. male with history of obesity, renal insufficiency, seizures, hypertension, insulin-dependent diabetes, presumed liver cirrhosis, recent hospital admission 3/28-4/11 for acute hypoxic respiratory failure in setting of ESBL Klebsiella pneumonia HAP requiring intubation/ICU, CRRT (extubated on 3/3, finished meropenem 4/7) brought to the ED from [name] ([place] after he was noted to be more confused and his lab work showed increased creatinine and ammonia levels.  Level 5 caveat due to acuity of condition.  Patient is oriented to his full name, place, year.  He has slow, slurred speech. He is moaning and screaming.  Positive ROS includes pain in his head and buttocks, and reports difficulty breathing.  Denies pain in chest chest or abdomen. Per EMS SBP in 90s.      HPI  Past Medical History:  Diagnosis Date  . Liver failure ([place])   . Renal failure     Patient Active Problem List   Diagnosis Date Noted  . Respiratory failure ([name])   . Acute renal failure superimposed on stage 4 chronic kidney disease ([name])   . End stage liver disease ([name])   . Pressure injury of skin 04/21/2018  . Altered mental status, unspecified 04/21/2018  . HCAP (healthcare-associated pneumonia) 04/21/2018  . Acute renal failure (ARF) ([place]) 04/07/2018    History reviewed. No pertinent surgical history.      Home Medications    Prior to Admission medications   Medication Sig Start Date End Date Taking? Authorizing Provider  acetaminophen (TYLENOL) 325 MG tablet Take 650 mg by mouth every 8 (eight) hours as needed for fever (pain).   Yes [provider]  amLODipine (NORVASC) 5 MG tablet Take 5 mg by mouth daily.    Yes [provider]   carbamazepine (TEGRETOL) 100 MG chewable tablet Chew 100 mg by mouth 2 (two) times daily. Take with 600 mg for a total dose of 700 mg.   Yes [provider]  carbamazepine (TEGRETOL) 200 MG tablet Take 600 mg by mouth 2 (two) times daily.   Yes [provider]  ferrous sulfate 325 (65 FE) MG tablet Take 325 mg by mouth daily with breakfast.   Yes [provider]  guaiFENesin (MUCINEX) 600 MG 12 hr tablet Take 600 mg by mouth every 12 (twelve) hours as needed for cough (congestion).   Yes [provider]  hydrocerin (EUCERIN) CREA Apply 1 application topically 2 (two) times daily. Apply to body 04/30/18  Yes [name], [name], MD  Insulin Glargine (BASAGLAR KWIKPEN) 100 UNIT/ML SOPN Inject 17 Units into the skin 2 (two) times daily.    Yes [provider]  lactulose, encephalopathy, (CHRONULAC) 10 GM/15ML SOLN Take 45 mLs (30 g total) by mouth 2 (two) times daily. Patient taking differently: Take 30 g by mouth 4 (four) times daily.  04/30/18  Yes [name], [name], MD  linagliptin (TRADJENTA) 5 MG TABS tablet Take 5 mg by mouth daily.   Yes [provider]  multivitamin (RENA-VIT) TABS tablet Take 1 tablet by mouth at bedtime. 04/30/18  Yes [name], [name], MD  Nutritional Supplements (FEEDING SUPPLEMENT, NEPRO CARB STEADY,) LIQD Take 237 mLs by mouth 2 (two) times daily between meals. 04/30/18  Yes [name], [name], MD  omeprazole (  PRILOSEC OTC) 20 MG tablet Take 20 mg by mouth daily.    Yes [provider]  PHENobarbital (LUMINAL) 32.4 MG tablet Take 32.4 mg by mouth at bedtime. Take with #3  64.8 mg tablets for a total dose of 226.8 mg   Yes [provider]  PHENobarbital (LUMINAL) 64.8 MG tablet Take 194.4 mg by mouth at bedtime. Take with a 32.4 mg tablet for a total dose of 226.8 mg   Yes [provider]  rifaximin (XIFAXAN) 550 MG TABS tablet Take 550 mg by mouth 2 (two) times daily. For diarrhea   Yes [provider]  simvastatin (ZOCOR) 20 MG tablet Take 20 mg by mouth at bedtime.    Yes [provider]  sodium bicarbonate 650 MG tablet Take 2 tablets (1,300 mg total) by mouth 3 (three) times daily. 04/30/18  Yes [name], [name], MD  tamsulosin (FLOMAX) 0.4 MG CAPS capsule Take 0.4 mg by mouth at bedtime.   Yes [provider]  Cholecalciferol (VITAMIN D) 50 MCG (2000 UT) tablet Take 2,000 Units by mouth daily.    [provider]  gabapentin (NEURONTIN) 100 MG capsule Take 100 mg by mouth 3 (three) times daily.     [provider]  insulin aspart (NOVOLOG) 100 UNIT/ML injection 0-15 Units, Subcutaneous, 3 times daily with meals CBG < 70: implement hypoglycemia protocol CBG 70 - 120: 0 units CBG 121 - 150: 2 units CBG 151 - 200: 3 units CBG 201 - 250: 5 units CBG 251 - 300: 8 units CBG 301 - 350: 11 units CBG 351 - 400: 15 units CBG > 400: call MD 04/30/18   [name], MD    Family History History reviewed. No pertinent family history.  Social History Social History   Tobacco Use  . Smoking status: Never Smoker  . Smokeless tobacco: Never Used  Substance Use Topics  . Alcohol use: Not on file  . Drug use: Not on file     Allergies   Fish oil   Review of Systems Review of Systems  Unable to perform ROS: Acuity of condition  Respiratory: Positive for shortness of breath.   Neurological: Positive for headaches.  Psychiatric/Behavioral: Positive for confusion.  All other systems reviewed and are negative.    Physical Exam Updated Vital Signs BP (!) 117/91   Pulse 66   Temp (!) 92.5 F (33.6 C)   Resp 10   Ht 6' 1"  (1.854 m)   Wt 122 kg   SpO2 95%   BMI 35.49 kg/m   Physical Exam Vitals signs and nursing note reviewed.  Constitutional:      Appearance: He is well-developed. He is ill-appearing.     Comments: Chronically ill-appearing, appears older than stated age. Moaning and screaming intermittently, can be  redirected during conversation  HENT:     Head: Normocephalic and atraumatic.     Nose: Nose normal.     Mouth/Throat:     Mouth: Mucous membranes are dry.     Comments: Dry lips and mucous membranes Eyes:     Conjunctiva/sclera: Conjunctivae normal.     Comments: PERRL and EOMs intact  Neck:     Musculoskeletal: Normal range of motion.  Cardiovascular:     Rate and Rhythm: Normal rate and regular rhythm.     Heart sounds: Normal heart sounds.     Comments: Borderline hypotensive.  1+ radial and DP pulses bilaterally.  No lower extremity edema.  Transient hypoxia  to 88% when asleep and head of the bed flat during repositioning/rectal temp.  This improves with elevation of the bed. Pulmonary:     Effort: Pulmonary effort is normal.     Breath sounds: Rales present.     Comments: Left lower lobe rales.  Abdominal:     General: Bowel sounds are normal.     Palpations: Abdomen is soft.     Tenderness: There is no abdominal tenderness.     Comments: Obese, soft nontender  Musculoskeletal: Normal range of motion.  Skin:    General: Skin is warm and dry.     Capillary Refill: Capillary refill takes less than 2 seconds.  Neurological:     Mental Status: He is alert and oriented to person, place, and time.     Comments:  Alert and oriented to self, place, time.  Speech is slow and slurred.  Can lift and hold legs/arms off bed for 5+ seconds  Sensation to light touch intact in face, hands and feet. No asterixis  CN I and II not tested CN III-XII grossly intact   Psychiatric:        Behavior: Behavior normal.      ED Treatments / Results  Labs (all labs ordered are listed, but only abnormal results are displayed) Labs Reviewed  COMPREHENSIVE METABOLIC PANEL - Abnormal; Notable for the following components:      Result Value   Chloride 116 (*)    CO2 9 (*)    Glucose, Bld 182 (*)    BUN 64 (*)    Creatinine, Ser 8.11 (*)    Calcium 7.3 (*)    Total Protein 6.2 (*)     Albumin 2.8 (*)    AST 12 (*)    Alkaline Phosphatase 140 (*)    GFR calc non Af Amer 7 (*)    GFR calc Af Amer 8 (*)    All other components within normal limits  CBC WITH DIFFERENTIAL/PLATELET - Abnormal; Notable for the following components:   RBC 2.41 (*)    Hemoglobin 7.2 (*)    HCT 23.8 (*)    RDW 16.3 (*)    Platelets 64 (*)    nRBC 1.1 (*)    All other components within normal limits  URINALYSIS, ROUTINE W REFLEX MICROSCOPIC - Abnormal; Notable for the following components:   Color, Urine AMBER (*)    APPearance HAZY (*)    Glucose, UA 50 (*)    Hgb urine dipstick SMALL (*)    Protein, ur 30 (*)    Leukocytes,Ua MODERATE (*)    Bacteria, UA RARE (*)    All other components within normal limits  AMMONIA - Abnormal; Notable for the following components:   Ammonia 48 (*)    All other components within normal limits  CBG MONITORING, ED - Abnormal; Notable for the following components:   Glucose-Capillary 158 (*)    All other components within normal limits  POCT I-STAT 7, (LYTES, BLD GAS, ICA,H+H) - Abnormal; Notable for the following components:   pH, Arterial 7.043 (*)    pO2, Arterial 74.0 (*)    Bicarbonate 10.4 (*)    TCO2 12 (*)    Acid-base deficit 20.0 (*)    Calcium, Ion 1.13 (*)    HCT 20.0 (*)    Hemoglobin 6.8 (*)    All other components within normal limits  SARS CORONAVIRUS 2 (HOSPITAL ORDER, [place] [facility])  MRSA PCR SCREENING  CULTURE, BLOOD (  ROUTINE X 2)  CULTURE, BLOOD (ROUTINE X 2)  URINE CULTURE  LACTIC ACID, PLASMA  RAPID URINE DRUG SCREEN, HOSP PERFORMED  BLOOD GAS, ARTERIAL  PROTIME-INR  APTT    EKG EKG Interpretation  Date/Time:  Saturday May 19 2018 09:21:24 EDT Ventricular Rate:  58 PR Interval:    QRS Duration: 176 QT Interval:  561 QTC Calculation: 552 R Axis:   -66 Text Interpretation:  Junctional rhythm Nonspecific IVCD with LAD Confirmed by Gerlene Fee [phone]) on 05/19/2018 10:01:46 AM    Radiology Dg Chest Port 1 View  Result Date: 05/19/2018 CLINICAL DATA:  Increased creatinine and ammonia levels. Surge speech. EXAM: PORTABLE CHEST 1 VIEW COMPARISON:  May 16, 2018 FINDINGS: Stable cardiomegaly. Mild pulmonary venous congestion without overt edema. No focal infiltrate. No pneumothorax. IMPRESSION: Cardiomegaly and pulmonary venous congestion. Electronically Signed   By: Dorise Bullion III M.D   On: 05/19/2018 10:34    Procedures .Critical Care Performed by: Kinnie Feil, [name] Authorized by: Kinnie Feil, [name]   Critical care provider statement:    Critical care time (minutes):  45   Critical care was necessary to treat or prevent imminent or life-threatening deterioration of the following conditions:  Renal failure and metabolic crisis   Critical care was time spent personally by me on the following activities:  Discussions with consultants, evaluation of patient's response to treatment, examination of patient, ordering and performing treatments and interventions, ordering and review of laboratory studies, ordering and review of radiographic studies, pulse oximetry, re-evaluation of patient's condition, obtaining history from patient or surrogate and review of old charts (frequent reassessment, multiple consults to ICU, hospitalist, nephrology)   I assumed direction of critical care for this patient from another provider in my specialty: no     (including critical care time)  Medications Ordered in ED Medications  ceFEPIme (MAXIPIME) 1 g in sodium chloride 0.9 % 100 mL IVPB (has no administration in time range)  vancomycin variable dose per unstable renal function (pharmacist dosing) (has no administration in time range)  sodium chloride 0.9 % bolus 500 mL (0 mLs Intravenous Stopped 05/19/18 1111)  ceFEPIme (MAXIPIME) 2 g in sodium chloride 0.9 % 100 mL IVPB (0 g Intravenous Stopped 05/19/18 1111)  vancomycin (VANCOCIN) 2,000 mg in sodium chloride 0.9 % 500 mL  IVPB (0 mg Intravenous Stopped 05/19/18 1422)  sodium bicarbonate injection 50 mEq (50 mEq Intravenous Given 05/19/18 1349)     Initial Impression / Assessment and Plan / ED Course  I have reviewed the triage vital signs and the nursing notes.  Pertinent labs & imaging results that were available during my care of the patient were reviewed by me and considered in my medical decision making (see chart for details).  Clinical Course as of May 19 1423  Sat May 19, 2018  0945 Hypotensive, rectal temp 88.5, hypoxia 88% when asleep and supine. Unknown if he has OSA. Rales on LLL. Sepsis code activated. Will give 500 cc IVF close reassessment, on bear hugger, [place] < 4L for now. Discussed with EDMD    [CG]  [phone] Discussed abx with pharmacist [place]. Given recent intubation, +ESBL cultures in the past and sepsis will opt for cefepime and vancomycin. His last creatinine was 1.86 on 4/9 before last discharge. Pharmacy to assist with dosing based on pending labs.    [CG]  1035 Chalmers Guest): MODERATE [CG]  1035 WBC, UA: 21-50 [CG]  1035 Bacteria, UA(!): RARE [CG]  1035 In and out sample  RBC /  HPF: 11-20 [CG]  1035 Stable   Hemoglobin(!): 7.2 [CG]  1046 FINDINGS: Stable cardiomegaly. Mild pulmonary venous congestion without overt edema. No focal infiltrate. No pneumothorax.  DG Chest Port 1 View [CG]  1046 Ammonia(!): 48 [CG]  1046 Similar to previous   BP(!): 103/58 [CG]  1046 Creatinine(!): 8.11 [CG]  1047 BUN(!): 64 [CG]  1155 Spoke to Dr Lorin Mercy recommends ICU consult, pending    [CG]  1254 Spoke to Dr Rennis Harding, regarding IVF resuscitation and BP. He will come see patient in ED, recommends ABG MAP still > 65    [CG]  1326 Spoke to Dr Royce Macadamia who will come see patient in ED    [CG]    Clinical Course User Index [CG] Kinnie Feil, [name]       48 yo here with AMS, reported elevated ammonia and creatinine at SNF.   On arrival he is confused, hypothermic, transiently hypoxic and with  soft SBP in 90s.  Considered high risk given pmh.  Code sepsis activated given recent hospitalization, rales in LLL.   Ddx is broad given complex pmh including occult infection UTI, HAP, even COVID given recent hospitalizations, hyperammonemia, uremia.  Final Clinical Impressions(s) / ED Diagnoses   1115: Labs as above remarkable for elevated creatinine/BUN, alk phos, UTI. Stable anemia. Hyperglycemia without AG. Ammonia 48. CXR with pulmonary congestion. MAP > 65 with SBP steady in low 100s similar to previous. Lactic acid normal. So far received 500 cc IVF given creatinine.  No indication for 30cc/kg boluses given normal lactic acid, SBP >90 and no other signs of hypoperfusion. Discussed with EDMD.  Pending consult, will discuss further IVF with admitting clinician.   1345: ABG with pH 7.30. Bicarp amp ordered. Dr Rennis Harding has evaluated patient in the ED, accepting patient. Dr Royce Macadamia to see patient. Discussed with EDMD.   5: Dr Royce Macadamia at bedside recommends bicarb drip and CTAP w/o contrast for now. Pending admission by Dr Rennis Harding.  Final diagnoses:  Confusion  Acidemia    ED Discharge Orders    None         Arlean Hopping 05/19/18 1425    Maudie Flakes, MD 05/20/18 [phone]

## 2018-05-19 NOTE — Procedures (Signed)
Central Venous Catheter Insertion Procedure Note Eddie Davies [id number] Aug 15, 1970  Procedure: Insertion of Central Venous Catheter Indications: dialysis  Procedure Details Consent: Risks of procedure as well as the alternatives and risks of each were explained to the (patient/caregiver).  Consent for procedure obtained. Time Out: Verified patient identification, verified procedure, site/side was marked, verified correct patient position, special equipment/implants available, medications/allergies/relevent history reviewed, required imaging and test results available.  Performed  Maximum sterile technique was used including antiseptics, cap, gloves, gown, hand hygiene, mask and sheet. Skin prep: Chlorhexidine; local anesthetic administered A antimicrobial bonded/coated triple lumen catheter was placed in the right internal jugular vein using the Seldinger technique.  Evaluation Blood flow good Complications: No apparent complications Patient did tolerate procedure well. Chest X-ray ordered to verify placement.  CXR: pending.  Eddie Davies R Eddie Davies 05/19/2018, 8:44 PM

## 2018-05-20 LAB — GLUCOSE, CAPILLARY
Glucose-Capillary: 113 mg/dL — ABNORMAL HIGH (ref 70–99)
Glucose-Capillary: 128 mg/dL — ABNORMAL HIGH (ref 70–99)
Glucose-Capillary: 143 mg/dL — ABNORMAL HIGH (ref 70–99)
Glucose-Capillary: 150 mg/dL — ABNORMAL HIGH (ref 70–99)
Glucose-Capillary: 165 mg/dL — ABNORMAL HIGH (ref 70–99)
Glucose-Capillary: 61 mg/dL — ABNORMAL LOW (ref 70–99)
Glucose-Capillary: 64 mg/dL — ABNORMAL LOW (ref 70–99)
Glucose-Capillary: 79 mg/dL (ref 70–99)
Glucose-Capillary: 90 mg/dL (ref 70–99)

## 2018-05-20 LAB — CBC
HCT: 22.6 % — ABNORMAL LOW (ref 39.0–52.0)
Hemoglobin: 7.1 g/dL — ABNORMAL LOW (ref 13.0–17.0)
MCH: 29.5 pg (ref 26.0–34.0)
MCHC: 31.4 g/dL (ref 30.0–36.0)
MCV: 93.8 fL (ref 80.0–100.0)
Platelets: 55 10*3/uL — ABNORMAL LOW (ref 150–400)
RBC: 2.41 MIL/uL — ABNORMAL LOW (ref 4.22–5.81)
RDW: 16.4 % — ABNORMAL HIGH (ref 11.5–15.5)
WBC: 3.4 10*3/uL — ABNORMAL LOW (ref 4.0–10.5)
nRBC: 0.6 % — ABNORMAL HIGH (ref 0.0–0.2)

## 2018-05-20 LAB — PREPARE FRESH FROZEN PLASMA: Unit division: 0

## 2018-05-20 LAB — RENAL FUNCTION PANEL
Albumin: 2.6 g/dL — ABNORMAL LOW (ref 3.5–5.0)
Albumin: 2.4 g/dL — ABNORMAL LOW (ref 3.5–5.0)
Anion gap: 13 (ref 5–15)
Anion gap: 9 (ref 5–15)
BUN: 51 mg/dL — ABNORMAL HIGH (ref 6–20)
BUN: 35 mg/dL — ABNORMAL HIGH (ref 6–20)
CO2: 17 mmol/L — ABNORMAL LOW (ref 22–32)
CO2: 19 mmol/L — ABNORMAL LOW (ref 22–32)
Calcium: 7.1 mg/dL — ABNORMAL LOW (ref 8.9–10.3)
Calcium: 6.9 mg/dL — ABNORMAL LOW (ref 8.9–10.3)
Chloride: 112 mmol/L — ABNORMAL HIGH (ref 98–111)
Chloride: 110 mmol/L (ref 98–111)
Creatinine, Ser: 6.65 mg/dL — ABNORMAL HIGH (ref 0.61–1.24)
Creatinine, Ser: 4.77 mg/dL — ABNORMAL HIGH (ref 0.61–1.24)
GFR calc Af Amer: 10 mL/min — ABNORMAL LOW (ref >60)
GFR calc Af Amer: 16 mL/min — ABNORMAL LOW (ref >60)
GFR calc non Af Amer: 9 mL/min — ABNORMAL LOW (ref >60)
GFR calc non Af Amer: 13 mL/min — ABNORMAL LOW (ref >60)
Glucose, Bld: 117 mg/dL — ABNORMAL HIGH (ref 70–99)
Glucose, Bld: 152 mg/dL — ABNORMAL HIGH (ref 70–99)
Phosphorus: 10.1 mg/dL — ABNORMAL HIGH (ref 2.5–4.6)
Phosphorus: 6.1 mg/dL — ABNORMAL HIGH (ref 2.5–4.6)
Potassium: 4 mmol/L (ref 3.5–5.1)
Potassium: 3.7 mmol/L (ref 3.5–5.1)
Sodium: 142 mmol/L (ref 135–145)
Sodium: 138 mmol/L (ref 135–145)

## 2018-05-20 LAB — LIPASE, BLOOD: Lipase: 20 U/L (ref 11–51)

## 2018-05-20 LAB — BPAM FFP
Blood Product Expiration Date: 202004302359
ISSUE DATE / TIME: 202004252007
Unit Type and Rh: 6200

## 2018-05-20 LAB — URINE CULTURE: Culture: NO GROWTH

## 2018-05-20 LAB — MAGNESIUM: Magnesium: 2.8 mg/dL — ABNORMAL HIGH (ref 1.7–2.4)

## 2018-05-20 MED ORDER — ORAL CARE MOUTH RINSE
15.0000 mL | Freq: Two times a day (BID) | OROMUCOSAL | Status: DC
  Administered 2018-05-20 – 2018-05-31 (×10): 15 mL via OROMUCOSAL

## 2018-05-20 MED ORDER — "THROMBI-PAD 3""X3"" EX PADS"
1.0000 | MEDICATED_PAD | Freq: Once | CUTANEOUS | Status: AC
  Administered 2018-05-20: 12:00:00 1 via TOPICAL
  Filled 2018-05-20: qty 1

## 2018-05-20 MED ORDER — DARBEPOETIN ALFA 40 MCG/0.4ML IJ SOSY
40.0000 ug | PREFILLED_SYRINGE | Freq: Once | INTRAMUSCULAR | Status: AC
  Administered 2018-05-20: 40 ug via INTRAVENOUS
  Filled 2018-05-20: qty 0.4

## 2018-05-20 MED ORDER — ALPRAZOLAM 0.25 MG PO TABS
0.2500 mg | ORAL_TABLET | Freq: Once | ORAL | Status: AC
  Administered 2018-05-20: 23:00:00 0.25 mg via ORAL
  Filled 2018-05-20: qty 1

## 2018-05-20 NOTE — Progress Notes (Signed)
[facility] Progress Note  Name: Eddie Davies MRN: [id number] DOB: 1970-03-04  Chief Complaint:  Altered mental status   Subjective:  Initiated on CRRT after vascath placement overnight.  He has been tolerating net even.  Pt with 1.2 liters UF over 4/25 with CRRT and has been anuric.  Review of systems:  Limited secondary to AMS which is improving   ------------------- Background on consult:  Eddie Davies is a 48 y.o. male with a history of cirrhosis and chronic kidney disease who presented to the hospital from his skilled nursing facility today with altered mental status and with report of abnormal labs.  His creatinine and ammonia were elevated on labs obtained 4/24 at his facility.  Creatinine was 7.41 and ammonia was 85.  He was recently discharged from [facility] after an admission with pneumonia during which time he required intubated.  At that time he also had AKI felt related to ischemic ATN and he actually required CRRT.  Critical care has evaluated him and is going to admit him to the ICU for closer monitoring.  He had a COVID swab which was negative.  A Foley was placed in the ER at my request.  Earlier he had an in and out cath with 200 ml of urine.  Power of attorney is Mosie Lukes at [phone].  His facility is [facility] at [phone].  He previously lived with [name] before going to the SNF - he is a cousin.  He has been making medical decisions for him.  The last time he spoke with the patient about his goals of care during the last admission, he had decided that he would want to do dialysis.  Harder to talk with the pandemic and he actually hasn't seen him in a while 2/2 same.  Spoke with his nurse at the SNF.  They have sent him out to the hospital a lot recently.  To his nurse's understanding, they have talked to him multiple times about dialysis in the past but he has always refused.  He has been eating ok recently per his nurse.   He has been incontinent and has had 1-2 voids per shift - he also has loose stools.  No vomiting.  She denies fever or hypothermia; temp was reported around 98 there today this AM.   Intake/Output Summary (Last 24 hours) at 05/20/2018 0535 Last data filed at 05/20/2018 0400 Gross per 24 hour  Intake 3529.25 ml  Output 1200 ml  Net 2329.25 ml    Vitals:  Vitals:   05/20/18 0100 05/20/18 0200 05/20/18 0300 05/20/18 0400  BP: 105/74 (!) 107/59 111/70 97/60  Pulse: 64 62 (!) 57 (!) 57  Resp: 11 11 11 11   Temp:      TempSrc:      SpO2: 100% 100% 100% 100%  Weight:      Height:         Physical Exam:  General adult male in bed less agitated  HEENT NCAT Neck increased neck circumference  Heart RRR; no rub no murmur Lungs clear to auscultation bilaterally; normal work of breathing  abd obese and distended; nontender Skin dry skin bilateral lower extremities  Neuro - answers questions; alert and oriented GU foley in place Access IJ vascath   Medications reviewed   Labs:  BMP Latest Ref Rng & Units 05/20/2018 05/19/2018 05/19/2018  Glucose 70 - 99 mg/dL 117(H) 181(H) 140(H)  BUN 6 - 20 mg/dL 51(H) 65(H) 48(H)  Creatinine  0.61 - 1.24 mg/dL 6.65(H) 8.34(H) 5.81(H)  Sodium 135 - 145 mmol/L 142 139 144  Potassium 3.5 - 5.1 mmol/L 4.0 4.6 3.2(L)  Chloride 98 - 111 mmol/L 112(H) 114(H) 129(H)  CO2 22 - 32 mmol/L 17(L) 11(L) 7(L)  Calcium 8.9 - 10.3 mg/dL 7.1(L) 6.9(L) 5.0(LL)     Assessment/Plan:  # AKI - Multifactorial secondary to pre-renal and cannot r/o obstruction.  Clinically dry appearing.  Note hx reported CKD and recent AKI with decreased renal reserve.  Note cr down to 1.9 at last discharge on 05/03/2018.  - Started CRRT on 4/25 after vascath placement - Continue CRRT  # Metabolic acidosis - Secondary to AKI  - Continue CRRT - Discontinue bicarb gtt   # AMS - Multifactorial, secondary in part to AKI and acidosis - Correct metabolic insults as above - Hold  sedating mediations ie. gabapentin  # Hypothermia  - broad spectrum ABX - vanc and cefepime - Improving and hemodynamics improving  # Normocytic Anemia  - s/p PRBC's; improved  # Cirrhosis  - Complicated by pancytopenia.  Hx hepatic encephalopathy - Supportive care   # Bilateral nephrolithiasis - Confirmed with radiology no hydronephrosis    Claudia Desanctis, MD 05/20/2018 5:35 AM

## 2018-05-20 NOTE — Progress Notes (Signed)
[facility] Progress Note Patient Name: Eddie Davies DOB: October 07, 1970 MRN: [id number]   Date of Service  05/20/2018  HPI/Events of Note  Anxiety - Says he takes his mother's valium at home. Valium not on pre hospital medication list. Also has liver and renal dysfunction on CRRT.   eICU Interventions  Will order: 1. Xanax 0.25 mg PO X 1.      Intervention Category Minor Interventions: Agitation / anxiety - evaluation and management  Lysle Dingwall 05/20/2018, 10:31 PM

## 2018-05-20 NOTE — Progress Notes (Addendum)
PULMONARY / CRITICAL CARE MEDICINE   NAME:  Eddie Davies, MRN:  [id number], DOB:  11-02-1970, LOS: 1 ADMISSION DATE:  05/19/2018, CONSULTATION DATE:  05/19/2018 REFERRING MD:  Delfina Redwood, CHIEF COMPLAINT:  Hypotension with confusion and acidemia  BRIEF HISTORY:    48 year old male with history of poorly-controlled DM, seizures, HTN, ESLD, CKD stage 3 who presented to the ED in transfer from a SNF with altered MS, acute on CKD stage 3 with acidemia, pH 7.08. HISTORY OF PRESENT ILLNESS   This is a 48 year old male with history of poorly-controlled DM, seizures, HTN, ESLD, CKD stage 3 who presented to the ED in transfer from a SNF in [facility]. The patient is unable to provide a reliable history but what has been gleaned is that on the day of admission he was noted to have an altered MS. Labs were drawn at the SNF that reportedly revealed elevated creatinine and NH3 levels. Upon presentation to the ED initial VS were BP 107/62, P 58 and T 88.5 rectally. His lowest recorded BP in the ED was 91/51 (MAP 64). He has maintained an adequate O2 saturation on RA. Initial labs revealed a BUN/Cr 64/8.11 and NH3 of 48.  AG of 15 and lactate of 1.3. WBC 4.6. PCCM was consulted for AMS and soft BPs. He received cefipime + vanc in ED for presumed sepsis. RA ABGs: 92/74/35/7.08 SIGNIFICANT PAST MEDICAL HISTORY   The patient was recently hospitalized at [facility] from 3/28-4/11/2018 after presenting as a transfer from [facility] for [place] requiring intubation.  The patient had been admitted the week prior (3/14-3/21) for encephalopathy with subsequent intubation.  He required abx for complicated UTI, CRRT for dialysis while hypotensive, lactulose, rifaximin for ESLD.  Patitent was d/c to [place] but returned after refusing dialsysis and medications for ESLD.  He had an altered MS in ED and was intubated for airway protection.  Head CT was without acute pathology and his ammonia level was 33.  His hospital course  included treatment with meropenem for ESBL Klebsiella pneumonia, AKI on CKD stage III that did not require RRT, metabolic encephalopathy with his ESLD managed with rifaximin and lactulose. He was extubated on 3/30. BUN/Cr prior to discharge was 43/1.86  STUDIES:   [facility] 4/25 personally reviewed: Cardiomegaly without acute infiltrates. I'm not impressed with pulm vasc, congestion as the lungs are underinflated. CULTURES:  Urine 4/25>> Blood 4/25>> 05/19/2018 COVID-19 negative  ANTIBIOTICS:  Cefipime 4/25>> Vanc 4/25>>  LINES/TUBES:   05/20/2018 right IJ hemodialysis catheter>> CONSULTANTS:  05/19/2018 nephrology SUBJECTIVE:  Somewhat confused.  Dull effect.  Currently hemodynamically stable  CONSTITUTIONAL: BP 98/61   Pulse 63   Temp (!) 94.5 F (34.7 C) (Axillary)   Resp 13   Ht 6' 1"  (1.854 m)   Wt 128 kg   SpO2 100%   BMI 37.23 kg/m   I/O last 3 completed shifts: In: 3770.7 [I.V.:1356.6; Blood:830; IV Piggyback:1584.1] Out: 5638 [Other:1463]        PHYSICAL EXAM: General: Morbidly obese male who is awake alert and off vasopressors. HEENT: Short neck.  Right IJ  hemodialysis catheter in place. Neuro: Awake alert follows commands.  Dull affect  CV: Sounds are distant regular rate and rhythm PULM: Diminished in the bases. GI: Obese soft nontender positive bowel sounds Extremities: 2+ lower extremity edema Skin: no rashes or lesions   RESOLVED PROBLEM LIST   ASSESSMENT AND PLAN   1) AMS with a history of epilepsy 05/20/2018 more awake follows commands  interactive although he is extremely dull affect We will DC Precedex on 05/20/2018 As he becomes more awake we will restart his barbiturates for his epilepsy and once he is demonstrated he can swallow appropriately.  2) Hemodynamic lability Hemodynamically stable 05/20/2018.  Soft Levophed drip 3) AKI on CKD stage 3. Nephrology is following.  CRRT per their instructions.  Question if he can transition to  intermittent hemodialysis now that he is off vasopressors. 4) Possible sepsis Continue current antibiotics and trend culture data 5) ESLD with elevated ammonia level Currently on lactulose and rifaximin 6) Poorly controlled DM Sliding scale insulin protocol is in place.  SUMMARY OF TODAY'S PLAN:  Continues in the ICU on CRRT Question if he cannot be changed to intermittent hemodialysis  Best Practice / Goals of Care / Disposition.   DVT PROPHYLAXIS:SCD SUP:SSI NUTRITION:NPO/05/20/2018 advance diet as tolerated MOBILITY:BR GOALS OF CARE:Correction of acidemia and management of AKI FAMILY DISCUSSIONS: None DISPOSITION ICU  LABS  Glucose Recent Labs  Lab 05/19/18 0926 05/19/18 1551 05/19/18 2154 05/20/18 0048 05/20/18 0415 05/20/18 0824  GLUCAP 158* 158* 158* 143* 113* 79    BMET Recent Labs  Lab 05/19/18 1450 05/19/18 1620 05/20/18 0419  NA 144 139 142  K 3.2* 4.6 4.0  CL 129* 114* 112*  CO2 7* 11* 17*  BUN 48* 65* 51*  CREATININE 5.81* 8.34* 6.65*  GLUCOSE 140* 181* 117*    Liver Enzymes Recent Labs  Lab 05/19/18 0936 05/20/18 0419  AST 12*  --   ALT 16  --   ALKPHOS 140*  --   BILITOT 0.5  --   ALBUMIN 2.8* 2.6*    Electrolytes Recent Labs  Lab 05/19/18 1450 05/19/18 1620 05/20/18 0419  CALCIUM 5.0* 6.9* 7.1*  MG  --   --  2.8*  PHOS  --   --  10.1*    CBC Recent Labs  Lab 05/19/18 0936 05/19/18 1309 05/20/18 0419  WBC 4.6  --  3.4*  HGB 7.2* 6.8* 7.1*  HCT 23.8* 20.0* 22.6*  PLT 64*  --  55*    ABG Recent Labs  Lab 05/19/18 1309  PHART 7.043*  PCO2ART 35.3  PO2ART 74.0*    Coag's Recent Labs  Lab 05/19/18 1339  APTT 47*  INR 1.6*    Sepsis Markers Recent Labs  Lab 05/19/18 0941  LATICACIDVEN 1.3    Cardiac Enzymes No results for input(s): TROPONINI, PROBNP in the last 168 hours.    App CCT 30 min  Richardson Landry Osa Campoli ACNP Maryanna Shape PCCM Pager [phone] till 1 pm If no answer page [phone] 05/20/2018, 9:07  AM

## 2018-05-20 NOTE — Progress Notes (Signed)
Hypoglycemic Event  CBG: 64  Treatment: 8 oz juice/soda  Symptoms: None  Follow-up CBG: Time:13:30 CBG Result:90  Possible Reasons for Event: Inadequate meal intake   Ulice Dash

## 2018-05-21 ENCOUNTER — Inpatient Hospital Stay (HOSPITAL_COMMUNITY): Payer: Medicare Other

## 2018-05-21 DIAGNOSIS — G9341 Metabolic encephalopathy: Secondary | ICD-10-CM

## 2018-05-21 LAB — HEPATIC FUNCTION PANEL
ALT: 15 U/L (ref 0–44)
AST: 13 U/L — ABNORMAL LOW (ref 15–41)
Albumin: 2.4 g/dL — ABNORMAL LOW (ref 3.5–5.0)
Alkaline Phosphatase: 123 U/L (ref 38–126)
Bilirubin, Direct: <0.1 mg/dL (ref 0.0–0.2)
Total Bilirubin: 0.5 mg/dL (ref 0.3–1.2)
Total Protein: 5.6 g/dL — ABNORMAL LOW (ref 6.5–8.1)

## 2018-05-21 LAB — CBC WITH DIFFERENTIAL/PLATELET
Abs Immature Granulocytes: 0.02 10*3/uL (ref 0.00–0.07)
Basophils Absolute: 0 10*3/uL (ref 0.0–0.1)
Basophils Relative: 1 %
Eosinophils Absolute: 0.1 10*3/uL (ref 0.0–0.5)
Eosinophils Relative: 3 %
HCT: 22.1 % — ABNORMAL LOW (ref 39.0–52.0)
Hemoglobin: 7.1 g/dL — ABNORMAL LOW (ref 13.0–17.0)
Immature Granulocytes: 1 %
Lymphocytes Relative: 16 %
Lymphs Abs: 0.5 10*3/uL — ABNORMAL LOW (ref 0.7–4.0)
MCH: 29.8 pg (ref 26.0–34.0)
MCHC: 32.1 g/dL (ref 30.0–36.0)
MCV: 92.9 fL (ref 80.0–100.0)
Monocytes Absolute: 0.3 10*3/uL (ref 0.1–1.0)
Monocytes Relative: 11 %
Neutro Abs: 2.2 10*3/uL (ref 1.7–7.7)
Neutrophils Relative %: 68 %
Platelets: 53 10*3/uL — ABNORMAL LOW (ref 150–400)
RBC: 2.38 MIL/uL — ABNORMAL LOW (ref 4.22–5.81)
RDW: 16.9 % — ABNORMAL HIGH (ref 11.5–15.5)
WBC: 3.2 10*3/uL — ABNORMAL LOW (ref 4.0–10.5)
nRBC: 0.6 % — ABNORMAL HIGH (ref 0.0–0.2)

## 2018-05-21 LAB — BASIC METABOLIC PANEL
Anion gap: 8 (ref 5–15)
BUN: 25 mg/dL — ABNORMAL HIGH (ref 6–20)
CO2: 23 mmol/L (ref 22–32)
Calcium: 7 mg/dL — ABNORMAL LOW (ref 8.9–10.3)
Chloride: 108 mmol/L (ref 98–111)
Creatinine, Ser: 3.67 mg/dL — ABNORMAL HIGH (ref 0.61–1.24)
GFR calc Af Amer: 21 mL/min — ABNORMAL LOW (ref >60)
GFR calc non Af Amer: 19 mL/min — ABNORMAL LOW (ref >60)
Glucose, Bld: 95 mg/dL (ref 70–99)
Potassium: 3.8 mmol/L (ref 3.5–5.1)
Sodium: 139 mmol/L (ref 135–145)

## 2018-05-21 LAB — PHOSPHORUS: Phosphorus: 4.7 mg/dL — ABNORMAL HIGH (ref 2.5–4.6)

## 2018-05-21 LAB — MAGNESIUM: Magnesium: 2.5 mg/dL — ABNORMAL HIGH (ref 1.7–2.4)

## 2018-05-21 LAB — GLUCOSE, CAPILLARY
Glucose-Capillary: 84 mg/dL (ref 70–99)
Glucose-Capillary: 91 mg/dL (ref 70–99)
Glucose-Capillary: 171 mg/dL — ABNORMAL HIGH (ref 70–99)
Glucose-Capillary: 161 mg/dL — ABNORMAL HIGH (ref 70–99)
Glucose-Capillary: 58 mg/dL — ABNORMAL LOW (ref 70–99)
Glucose-Capillary: 97 mg/dL (ref 70–99)

## 2018-05-21 LAB — PROTIME-INR
INR: 1.7 — ABNORMAL HIGH (ref 0.8–1.2)
Prothrombin Time: 19.3 seconds — ABNORMAL HIGH (ref 11.4–15.2)

## 2018-05-21 LAB — AMMONIA: Ammonia: 37 umol/L — ABNORMAL HIGH (ref 9–35)

## 2018-05-21 MED ORDER — SODIUM CHLORIDE 0.9% FLUSH
10.0000 mL | Freq: Two times a day (BID) | INTRAVENOUS | Status: DC
  Administered 2018-05-21 – 2018-05-31 (×19): 10 mL via INTRAVENOUS

## 2018-05-21 MED ORDER — ONDANSETRON HCL 4 MG/2ML IJ SOLN
4.0000 mg | Freq: Four times a day (QID) | INTRAMUSCULAR | Status: DC | PRN
  Administered 2018-05-21: 4 mg via INTRAVENOUS
  Filled 2018-05-21: qty 2

## 2018-05-21 MED ORDER — "THROMBI-PAD 3""X3"" EX PADS"
1.0000 | MEDICATED_PAD | Freq: Once | CUTANEOUS | Status: AC
  Administered 2018-05-21: 11:00:00 1 via TOPICAL
  Filled 2018-05-21: qty 1

## 2018-05-21 MED ORDER — FERROUS SULFATE 325 (65 FE) MG PO TABS
325.0000 mg | ORAL_TABLET | Freq: Every day | ORAL | Status: DC
  Administered 2018-05-21 – 2018-05-29 (×8): 325 mg via ORAL
  Filled 2018-05-21 (×9): qty 1

## 2018-05-21 MED ORDER — HEPARIN SODIUM (PORCINE) 1000 UNIT/ML IJ SOLN
3000.0000 [IU] | Freq: Once | INTRAMUSCULAR | Status: AC
  Administered 2018-05-21: 2.4 [IU] via INTRAVENOUS

## 2018-05-21 MED ORDER — DEXTROSE 50 % IV SOLN
INTRAVENOUS | Status: AC
  Filled 2018-05-21: qty 50

## 2018-05-21 MED ORDER — INSULIN ASPART 100 UNIT/ML ~~LOC~~ SOLN
0.0000 [IU] | Freq: Three times a day (TID) | SUBCUTANEOUS | Status: DC
  Administered 2018-05-21 (×2): 2 [IU] via SUBCUTANEOUS
  Administered 2018-05-22: 3 [IU] via SUBCUTANEOUS
  Administered 2018-05-22: 17:00:00 2 [IU] via SUBCUTANEOUS
  Administered 2018-05-22: 09:00:00 1 [IU] via SUBCUTANEOUS
  Administered 2018-05-23: 5 [IU] via SUBCUTANEOUS
  Administered 2018-05-23: 09:00:00 2 [IU] via SUBCUTANEOUS

## 2018-05-21 MED ORDER — INSULIN ASPART 100 UNIT/ML ~~LOC~~ SOLN
0.0000 [IU] | Freq: Every day | SUBCUTANEOUS | Status: DC
  Administered 2018-05-22 – 2018-05-23 (×2): 3 [IU] via SUBCUTANEOUS
  Administered 2018-05-25: 2 [IU] via SUBCUTANEOUS

## 2018-05-21 MED ORDER — CARBAMAZEPINE 100 MG PO CHEW: 100.0000 mg | CHEWABLE_TABLET | Freq: Two times a day (BID) | ORAL | Status: DC

## 2018-05-21 MED ORDER — PRISMASOL BGK 4/2.5 32-4-2.5 MEQ/L REPLACEMENT SOLN
Status: DC
  Filled 2018-05-21 (×2): qty 5000

## 2018-05-21 MED ORDER — PHENOBARBITAL 97.2 MG PO TABS
226.8000 mg | ORAL_TABLET | Freq: Every day | ORAL | Status: DC
  Administered 2018-05-21 – 2018-05-27 (×7): 243 mg via ORAL
  Filled 2018-05-21 (×6): qty 1
  Filled 2018-05-21: qty 3
  Filled 2018-05-21: qty 1

## 2018-05-21 MED ORDER — DEXTROSE 50 % IV SOLN
25.0000 mL | Freq: Once | INTRAVENOUS | Status: AC
  Administered 2018-05-21: 25 mL via INTRAVENOUS

## 2018-05-21 MED ORDER — CARBAMAZEPINE 200 MG PO TABS
700.0000 mg | ORAL_TABLET | Freq: Two times a day (BID) | ORAL | Status: DC
  Administered 2018-05-21 – 2018-05-31 (×20): 700 mg via ORAL
  Filled 2018-05-21 (×27): qty 3.5

## 2018-05-21 MED ORDER — ONDANSETRON HCL 4 MG/2ML IJ SOLN
4.0000 mg | Freq: Four times a day (QID) | INTRAMUSCULAR | Status: DC
  Administered 2018-05-21: 14:00:00 4 mg via INTRAVENOUS
  Filled 2018-05-21: qty 2

## 2018-05-21 MED ORDER — TAMSULOSIN HCL 0.4 MG PO CAPS
0.4000 mg | ORAL_CAPSULE | Freq: Every day | ORAL | Status: DC
  Administered 2018-05-21 – 2018-05-31 (×10): 0.4 mg via ORAL
  Filled 2018-05-21 (×10): qty 1

## 2018-05-21 MED ORDER — "THROMBI-PAD 3""X3"" EX PADS"
1.0000 | MEDICATED_PAD | CUTANEOUS | Status: AC
  Administered 2018-05-21: 20:00:00 1 via TOPICAL
  Filled 2018-05-21: qty 1

## 2018-05-21 MED ORDER — PHENOBARBITAL 32.4 MG PO TABS: 32.4000 mg | ORAL_TABLET | Freq: Every day | ORAL | Status: DC

## 2018-05-21 MED ORDER — SIMVASTATIN 20 MG PO TABS
20.0000 mg | ORAL_TABLET | Freq: Every day | ORAL | Status: DC
  Administered 2018-05-21 – 2018-05-31 (×10): 20 mg via ORAL
  Filled 2018-05-21 (×10): qty 1

## 2018-05-21 NOTE — Progress Notes (Signed)
PULMONARY / CRITICAL CARE MEDICINE   NAME:  Eddie Davies, MRN:  [id number], DOB:  04/17/1970, LOS: 2 ADMISSION DATE:  05/19/2018, CONSULTATION DATE:  05/19/2018 REFERRING MD:  Delfina Redwood, CHIEF COMPLAINT:  Hypotension with confusion and acidemia  BRIEF HISTORY:    48 yo male from SNF in [place] with altered mental status.  Found to have acute renal failure and metabolic acidosis.  SIGNIFICANT PAST MEDICAL HISTORY   DM, Seizures, CKD 3, Ichthyosis  SIGNIFICANT EVENTS  4/25 Admit, start CRRT  STUDIES:   CT abd/pelvis 4/25 >> atherosclerosis, chronic pancreatitis  CULTURES:  Urine 4/25 >> negative Blood 4/25 >> COVID 4/25 >> negative  ANTIBIOTICS:  Cefipime 4/25 >> 4/27 Vancomycin 4/25>> 4/27  LINES/TUBES:  Rt IJ HD 4/26 >>   CONSULTANTS:  4/25  Nephrology  SUBJECTIVE:  Remains on CRRT.  CONSTITUTIONAL: BP 130/80   Pulse 63   Temp (!) 97.5 F (36.4 C) (Oral)   Resp 14   Ht 6' 1"  (1.854 m)   Wt 130 kg   SpO2 99%   BMI 37.81 kg/m   I/O last 3 completed shifts: In: 3273 [P.O.:595; I.V.:1298.2; Blood:830; IV Piggyback:549.9] Out: 2722 [Urine:115; Other:1807; Stool:800]  PHYSICAL EXAM:  General - alert Eyes - pupils reactive ENT - no sinus tenderness, no stridor Cardiac - regular rate/rhythm, no murmur Chest - equal breath sounds b/l, no wheezing or rales Abdomen - soft, non tender, + bowel sounds Extremities - no cyanosis, clubbing, or edema Skin - diffuse changes of ichthyosis Neuro - normal strength, moves extremities, follows commands   RESOLVED PROBLEM LIST  Metabolic acidosis  ASSESSMENT AND PLAN    Acute renal failure from prerenal azotemia >> baseline creatinine 1.86 from 05/03/18. CKD 3. BPH. Plan - can likely transition off CRRT > defer to renal - resume flomax  Acute metabolic encephalopathy. Hx of seizures. Plan - resume tegretol, phenobarb - hold neurotin  Sepsis ruled out. Plan - d/c ABx  Hx of HTN, HLD. Plan - hold  norvasc - resume zocor  Reported history of end stage liver disease, but liver on CT abdomen unremarkable. Plan - d/c chronulac, rifaximin  DM type II. Plan - SSI  Anemia, thrombocytopenia of critical illness. Plan - f/u CBC - transfuse for Hb < 7 - resume ferrous sulfate  Best Practice / Goals of Care / Disposition.   DVT PROPHYLAXIS:SCD SUP: Protonix NUTRITION: carb modified, renal MOBILITY: bed rest GOALS OF CARE: full code DISPOSITION: ICU  LABS   CMP Latest Ref Rng & Units 05/21/2018 05/20/2018 05/20/2018  Glucose 70 - 99 mg/dL 95 152(H) 117(H)  BUN 6 - 20 mg/dL 25(H) 35(H) 51(H)  Creatinine 0.61 - 1.24 mg/dL 3.67(H) 4.77(H) 6.65(H)  Sodium 135 - 145 mmol/L 139 138 142  Potassium 3.5 - 5.1 mmol/L 3.8 3.7 4.0  Chloride 98 - 111 mmol/L 108 110 112(H)  CO2 22 - 32 mmol/L 23 19(L) 17(L)  Calcium 8.9 - 10.3 mg/dL 7.0(L) 6.9(L) 7.1(L)  Total Protein 6.5 - 8.1 g/dL 5.6(L) - -  Total Bilirubin 0.3 - 1.2 mg/dL 0.5 - -  Alkaline Phos 38 - 126 U/L 123 - -  AST 15 - 41 U/L 13(L) - -  ALT 0 - 44 U/L 15 - -   CBC Latest Ref Rng & Units 05/21/2018 05/20/2018 05/19/2018  WBC 4.0 - 10.5 K/uL 3.2(L) 3.4(L) -  Hemoglobin 13.0 - 17.0 g/dL 7.1(L) 7.1(L) 6.8(LL)  Hematocrit 39.0 - 52.0 % 22.1(L) 22.6(L) 20.0(L)  Platelets 150 - 400 K/uL 53(L)  55(L) -   ABG    Component Value Date/Time   PHART 7.043 (LL) 05/19/2018 1309   PCO2ART 35.3 05/19/2018 1309   PO2ART 74.0 (L) 05/19/2018 1309   HCO3 10.4 (L) 05/19/2018 1309   TCO2 12 (L) 05/19/2018 1309   ACIDBASEDEF 20.0 (H) 05/19/2018 1309   O2SAT 92.0 05/19/2018 1309   CBG (last 3)  Recent Labs    05/20/18 2331 05/21/18 0411 05/21/18 0721  GLUCAP 128* 104 Weber, MD [place] 05/21/2018, 8:47 AM

## 2018-05-21 NOTE — Progress Notes (Signed)
Patient continues to be restless and sleep deprived.  Patient inadvertently removed 22g piv to left hand.  Patient continues to bleed from neck dialysis cath.  xanex given hours ago but hasnt worked.  Patient currently on left side trying to get sleep.  precedex was removed from med list during day shift

## 2018-05-21 NOTE — Progress Notes (Signed)
Stopped CRRT at 1045 - pt given back all blood - no clots noted 152 ml. Tolerated well - took down dressing - area cleansed with Chloropreps - noted 2 large clots - placed thrombi-pads to area around catheter need to redress with sterile 4 x 4 's to keep thromi-pads in place Bleeding stopped at this time

## 2018-05-21 NOTE — Procedures (Signed)
Admit: 05/19/2018 LOS: 2  78M Cirrhosis, CKD (Labile SCr, perhaps SCr as low as 1.9) admitted with AMS and AKI.  Recent [facility] admit for pneumonia / VDRF  Current CRRT Prescription: Start Date: 05/19/18 Catheter: R IJ Temp Cath BFR: 200 Pre Blood Pump: 300 4K DFR: 1800 4K Replacement Rate: 300 4K Goal UF: Net even Anticoagulation: none Clotting: none   S:  Awake, alert, oriented x3  BP stable no pressors  On RA  Little UOP   O: 04/26 0701 - 04/27 0700 In: 1144.9 [P.O.:595; IV Piggyback:549.9] Out: 6256 [Urine:115; Stool:800]  Filed Weights   05/19/18 1547 05/20/18 0500 05/21/18 0421  Weight: 127.2 kg 128 kg 130 kg    Recent Labs  Lab 05/20/18 0419 05/20/18 1521 05/21/18 0419  NA 142 138 139  K 4.0 3.7 3.8  CL 112* 110 108  CO2 17* 19* 23  GLUCOSE 117* 152* 95  BUN 51* 35* 25*  CREATININE 6.65* 4.77* 3.67*  CALCIUM 7.1* 6.9* 7.0*  PHOS 10.1* 6.1* 4.7*   Recent Labs  Lab 05/19/18 0936 05/19/18 1309 05/20/18 0419 05/21/18 0419  WBC 4.6  --  3.4* 3.2*  NEUTROABS 3.1  --   --  2.2  HGB 7.2* 6.8* 7.1* 7.1*  HCT 23.8* 20.0* 22.6* 22.1*  MCV 98.8  --  93.8 92.9  PLT 64*  --  55* 53*    Scheduled Meds: . carbamazepine  700 mg Oral BID  . ferrous sulfate  325 mg Oral Q breakfast  . insulin aspart  0-5 Units Subcutaneous QHS  . insulin aspart  0-9 Units Subcutaneous TID WC  . mouth rinse  15 mL Mouth Rinse BID  . PHENobarbital  243 mg Oral QHS  . simvastatin  20 mg Oral QHS  . tamsulosin  0.4 mg Oral QHS   Continuous Infusions: .  prismasol BGK 4/2.5 300 mL/hr at 05/21/18 0840  .  prismasol BGK 4/2.5    . prismasol BGK 4/2.5 1,800 mL/hr at 05/21/18 0801   PRN Meds:.heparin  ABG    Component Value Date/Time   PHART 7.043 (LL) 05/19/2018 1309   PCO2ART 35.3 05/19/2018 1309   PO2ART 74.0 (L) 05/19/2018 1309   HCO3 10.4 (L) 05/19/2018 1309   TCO2 12 (L) 05/19/2018 1309   ACIDBASEDEF 20.0 (H) 05/19/2018 1309   O2SAT 92.0 05/19/2018 1309     A/P  1. Dialysis dependent AKI, labile baseline CKD; CT A/P at admit with b/l nonobstructive nephrolithiasis 2. Cirrhosis 3. Morbid obesity 4. Long term SNF resident 5. B/l nonobstructive nephrolithiasis 6. Anemia 7. Hx/o seizures  Stop CRRT. Follow UOP, fliud balance, daily labs. Not sure if will req further HD.  Tol PO. Is 2.2L positive.  Hold IVF for now  Pearson Grippe, MD [employer] pgr [phone]

## 2018-05-21 NOTE — TOC Initial Note (Signed)
Transition of Care [facility]) - Initial/Assessment Note    Patient Details  Name: Eddie Davies MRN: [id number] Date of Birth: 04/24/1970  [facility] CM/SW Contact:    Eddie Davies, [place] Phone Number: 05/21/2018, 12:37 PM  Clinical Narrative:                  Patient is a long term care resident of [facility] in [place]. CSW called and spoke with the patient's sister. CSW introduced herself and explained her role. She is okay with her brother returning back to [facility]. CSW explained that he is experiencing some confusion and CSW explained that she needed to confirm with family about appropriate discharge plan. She had questions about her brother's dialysis, CSW referred patient's sister to speak with the patient's nurse. CSW provided unit phone number.   CSW called and spoke with the RN. Patient is supposed to be completing hemodialysis this week and hopefully won't need it any longer. The patient is agreeable to going back to his facility. Will need Fl2 closer to time of discharge. COVID screen is negative, it was completed on 05/19/2018.   CSW will continue to follow and assist with disposition.    Expected Discharge Plan: Skilled Nursing Facility Barriers to Discharge: Continued Medical Work up   Patient Goals and CMS Choice   CMS Medicare.gov Compare Post Acute Care list provided to:: Other (Comment Required)(Pt long term care resident at [facility]) Choice offered to / list presented to : NA  Expected Discharge Plan and Services Expected Discharge Plan: [place] In-house Referral: Clinical Social Work Discharge Planning Services: NA Post Acute Care Choice: [place] Living arrangements for the past 2 months: [place] Expected Discharge Date: 05/25/18               DME Arranged: N/A DME Agency: NA       HH Arranged: NA [place] Agency: NA        Prior Living Arrangements/Services Living arrangements for  the past 2 months: [place] Lives with:: Facility Resident Patient language and need for interpreter reviewed:: No Do you feel safe going back to the place where you live?: Yes      Need for Family Participation in Patient Care: Yes (Comment) Care giver support system in place?: Yes (comment)   Criminal Activity/Legal Involvement Pertinent to Current Situation/Hospitalization: No - Comment as needed  Activities of Daily Living Home Assistive Devices/Equipment: Cane (specify quad or straight) ADL Screening (condition at time of admission) Patient's cognitive ability adequate to safely complete daily activities?: Yes Is the patient deaf or have difficulty hearing?: No Does the patient have difficulty seeing, even when wearing glasses/contacts?: No Does the patient have difficulty concentrating, remembering, or making decisions?: Yes Patient able to express need for assistance with ADLs?: Yes Does the patient have difficulty dressing or bathing?: Yes Independently performs ADLs?: No Communication: Independent Is this a change from baseline?: Pre-admission baseline Dressing (OT): Dependent Grooming: Dependent Is this a change from baseline?: Pre-admission baseline Feeding: Needs assistance(needs set up) Is this a change from baseline?: Pre-admission baseline Bathing: Dependent Is this a change from baseline?: Pre-admission baseline Toileting: Needs assistance Is this a change from baseline?: Pre-admission baseline In/Out Bed: Needs assistance Walks in Home: Kasandra Knudsen ) Does the patient have difficulty walking or climbing stairs?: Yes Weakness of Legs: Both Weakness of Arms/Hands: Both  Permission Sought/Granted Permission sought to share information with : Case Manager Permission granted to share information  with : Yes, Verbal Permission Granted  Share Information with NAME: Eddie Davies  Permission granted to share info w AGENCY: [place] granted to share  info w Relationship: Sister  Permission granted to share info w Contact Information: [phone]  Emotional Assessment Appearance:: Appears stated age Attitude/Demeanor/Rapport: Unable to Assess Affect (typically observed): Unable to Assess Orientation: : Oriented to Self, Oriented to  Time, Oriented to Situation Alcohol / Substance Use: Not Applicable Psych Involvement: No (comment)  Admission diagnosis:  Acidemia [E87.2] Confusion [R41.0] Patient Active Problem List   Diagnosis Date Noted  . Metabolic acidosis 51/76/1607  . Respiratory failure ([place])   . Acute renal failure superimposed on stage 4 chronic kidney disease ([place])   . End stage liver disease ([place])   . Pressure injury of skin 04/21/2018  . Altered mental status, unspecified 04/21/2018  . HCAP (healthcare-associated pneumonia) 04/21/2018  . Acute renal failure (ARF) ([place]) 04/07/2018   PCP:  P[place], [place]:  No Pharmacies Listed    Social Determinants of Health (SDOH) Interventions    Readmission Risk Interventions Readmission Risk Prevention Plan 04/27/2018  Transportation Screening Complete  PCP or Specialist Appt within 5-7 Days Complete  Home Care Screening Complete  Medication Review (RN CM) Complete  Some recent data might be hidden

## 2018-05-21 NOTE — Progress Notes (Addendum)
Rt neck at Dialysis catheter still bleeding reinforced with 4 x 4 earlier - dressing take down reapplied new thrombi-pads and 5 4 x 4 s with wide tape - MD made aware  At 1300 pt had 50 cc green emesis - nothing for nausea MD made aware - orders received

## 2018-05-22 DIAGNOSIS — R410 Disorientation, unspecified: Secondary | ICD-10-CM | POA: Diagnosis present

## 2018-05-22 LAB — CBC
HCT: 19.7 % — ABNORMAL LOW (ref 39.0–52.0)
Hemoglobin: 6.3 g/dL — CL (ref 13.0–17.0)
MCH: 29.7 pg (ref 26.0–34.0)
MCHC: 32 g/dL (ref 30.0–36.0)
MCV: 92.9 fL (ref 80.0–100.0)
Platelets: 60 10*3/uL — ABNORMAL LOW (ref 150–400)
RBC: 2.12 MIL/uL — ABNORMAL LOW (ref 4.22–5.81)
RDW: 16.3 % — ABNORMAL HIGH (ref 11.5–15.5)
WBC: 3.8 10*3/uL — ABNORMAL LOW (ref 4.0–10.5)
nRBC: 0.8 % — ABNORMAL HIGH (ref 0.0–0.2)

## 2018-05-22 LAB — RENAL FUNCTION PANEL
Albumin: 2.4 g/dL — ABNORMAL LOW (ref 3.5–5.0)
Albumin: 2.5 g/dL — ABNORMAL LOW (ref 3.5–5.0)
Anion gap: 10 (ref 5–15)
Anion gap: 9 (ref 5–15)
BUN: 27 mg/dL — ABNORMAL HIGH (ref 6–20)
BUN: 35 mg/dL — ABNORMAL HIGH (ref 6–20)
CO2: 19 mmol/L — ABNORMAL LOW (ref 22–32)
CO2: 19 mmol/L — ABNORMAL LOW (ref 22–32)
Calcium: 7 mg/dL — ABNORMAL LOW (ref 8.9–10.3)
Calcium: 7.2 mg/dL — ABNORMAL LOW (ref 8.9–10.3)
Chloride: 107 mmol/L (ref 98–111)
Chloride: 107 mmol/L (ref 98–111)
Creatinine, Ser: 4.28 mg/dL — ABNORMAL HIGH (ref 0.61–1.24)
Creatinine, Ser: 5.09 mg/dL — ABNORMAL HIGH (ref 0.61–1.24)
GFR calc Af Amer: 18 mL/min — ABNORMAL LOW (ref >60)
GFR calc Af Amer: 14 mL/min — ABNORMAL LOW (ref >60)
GFR calc non Af Amer: 15 mL/min — ABNORMAL LOW (ref >60)
GFR calc non Af Amer: 12 mL/min — ABNORMAL LOW (ref >60)
Glucose, Bld: 140 mg/dL — ABNORMAL HIGH (ref 70–99)
Glucose, Bld: 276 mg/dL — ABNORMAL HIGH (ref 70–99)
Phosphorus: 5 mg/dL — ABNORMAL HIGH (ref 2.5–4.6)
Phosphorus: 5.6 mg/dL — ABNORMAL HIGH (ref 2.5–4.6)
Potassium: 3.8 mmol/L (ref 3.5–5.1)
Potassium: 4.1 mmol/L (ref 3.5–5.1)
Sodium: 136 mmol/L (ref 135–145)
Sodium: 135 mmol/L (ref 135–145)

## 2018-05-22 LAB — GLUCOSE, CAPILLARY
Glucose-Capillary: 113 mg/dL — ABNORMAL HIGH (ref 70–99)
Glucose-Capillary: 156 mg/dL — ABNORMAL HIGH (ref 70–99)
Glucose-Capillary: 134 mg/dL — ABNORMAL HIGH (ref 70–99)
Glucose-Capillary: 218 mg/dL — ABNORMAL HIGH (ref 70–99)
Glucose-Capillary: 196 mg/dL — ABNORMAL HIGH (ref 70–99)
Glucose-Capillary: 276 mg/dL — ABNORMAL HIGH (ref 70–99)

## 2018-05-22 LAB — PREPARE RBC (CROSSMATCH)

## 2018-05-22 LAB — MAGNESIUM: Magnesium: 2.5 mg/dL — ABNORMAL HIGH (ref 1.7–2.4)

## 2018-05-22 LAB — HEMOGLOBIN AND HEMATOCRIT, BLOOD
HCT: 21.4 % — ABNORMAL LOW (ref 39.0–52.0)
Hemoglobin: 7.1 g/dL — ABNORMAL LOW (ref 13.0–17.0)

## 2018-05-22 MED ORDER — RIFAXIMIN 550 MG PO TABS: 550.0000 mg | ORAL_TABLET | Freq: Two times a day (BID) | ORAL | Status: DC

## 2018-05-22 MED ORDER — SODIUM CHLORIDE 0.9% FLUSH
10.0000 mL | INTRAVENOUS | Status: DC | PRN
  Administered 2018-05-23: 09:00:00 10 mL
  Filled 2018-05-22: qty 40

## 2018-05-22 MED ORDER — SODIUM CHLORIDE 0.9% IV SOLUTION
Freq: Once | INTRAVENOUS | Status: AC
  Administered 2018-05-22: 07:00:00 via INTRAVENOUS

## 2018-05-22 NOTE — Progress Notes (Addendum)
Report given to [facility], receiving RN 6E.  Pt to be transported by bed.

## 2018-05-22 NOTE — Progress Notes (Signed)
Admit: 05/19/2018 LOS: 3  45M Cirrhosis, CKD (Labile SCr, perhaps SCr as low as 1.9) admitted with AMS and AKI.  Recent [facility] admit for pneumonia / VDRF  Subjective:  . CRRT stopped yesterdy . BP stable, afebrile, on RA SpO2 100% . 0.2L UOP yesterday . SCr 4.28, K 3.8, HCO3 19 . 1u PRBC this AM  . Tol PO, awake and alert this AM  04/27 0701 - 04/28 0700 In: 205 [P.O.:195; I.V.:10] Out: 290 [Urine:230]  Filed Weights   05/20/18 0500 05/21/18 0421 05/22/18 0500  Weight: 128 kg 130 kg 126.1 kg    Scheduled Meds: . carbamazepine  700 mg Oral BID  . ferrous sulfate  325 mg Oral Q breakfast  . insulin aspart  0-5 Units Subcutaneous QHS  . insulin aspart  0-9 Units Subcutaneous TID WC  . mouth rinse  15 mL Mouth Rinse BID  . PHENobarbital  243 mg Oral QHS  . simvastatin  20 mg Oral QHS  . sodium chloride flush  10 mL Intravenous Q12H  . tamsulosin  0.4 mg Oral QHS   Continuous Infusions: PRN Meds:.heparin, ondansetron (ZOFRAN) IV  Current Labs: reviewed    Physical Exam:  Blood pressure (!) 158/82, pulse 68, temperature 98.9 F (37.2 C), temperature source Oral, resp. rate 14, height 6' 1"  (1.854 m), weight 126.1 kg, SpO2 96 %. Obese, NAD Hand mittens on RRR nl s1s2 CTAB Ichythosis present No sig LEE S/nt/nd Nonfocal Foley cath in place  A 1. Dialysis dependent AKI, labile baseline CKD; CT A/P at admit with b/l nonobstructive nephrolithiasis; probably ATN 2. Cirrhosis 3. Morbid obesity 4. Long term SNF resident 5. B/l nonobstructive nephrolithiasis 6. Anemia 7. Hx/o seizures 8. L IJ Temp HD cath, sig oozing  P . Cont supportive care, follow daily for HD needs . Ad Lib PO fluids s/p 1u PRBC today . Medication Issues; o Preferred narcotic agents for pain control are hydromorphone, fentanyl, and methadone. Morphine should not be used.  o Baclofen should be avoided o Avoid oral sodium phosphate and magnesium citrate based laxatives / bowel preps    Pearson Grippe  MD 05/22/2018, 9:10 AM  Recent Labs  Lab 05/20/18 1521 05/21/18 0419 05/22/18 0522  NA 138 139 136  K 3.7 3.8 3.8  CL 110 108 107  CO2 19* 23 19*  GLUCOSE 152* 95 140*  BUN 35* 25* 27*  CREATININE 4.77* 3.67* 4.28*  CALCIUM 6.9* 7.0* 7.0*  PHOS 6.1* 4.7* 5.0*   Recent Labs  Lab 05/19/18 0936  05/20/18 0419 05/21/18 0419 05/22/18 0522  WBC 4.6  --  3.4* 3.2* 3.8*  NEUTROABS 3.1  --   --  2.2  --   HGB 7.2*   < > 7.1* 7.1* 6.3*  HCT 23.8*   < > 22.6* 22.1* 19.7*  MCV 98.8  --  93.8 92.9 92.9  PLT 64*  --  55* 53* 60*   < > = values in this interval not displayed.

## 2018-05-22 NOTE — Progress Notes (Signed)
Attempted report to 6E. Receiving RN unable to take report at this time. Provided my number for call back

## 2018-05-22 NOTE — Evaluation (Signed)
Physical Therapy Evaluation Patient Details Name: Eddie Davies MRN: [id number] DOB: 12/25/1970 Today's Date: 05/22/2018   History of Present Illness  Pt adm with acute on chronic renal failure and started on CRRT on 4/25. Stopped CRRT on 4/27. PMH - recent admissions for respiratory failure with vent and septic shock, DM, seizures, htn, esrd, cognitive delays, cirrhosis  Clinical Impression  Pt admitted with above diagnosis and presents to PT with functional limitations due to deficits listed below (See PT problem list). Pt needs skilled PT to maximize independence and safety to allow discharge to [facility] where he is a long term resident.      Follow Up Recommendations SNF    Equipment Recommendations  None recommended by PT    Recommendations for Other Services       Precautions / Restrictions Precautions Precautions: Fall Restrictions Weight Bearing Restrictions: No      Mobility  Bed Mobility Overal bed mobility: Needs Assistance Bed Mobility: Supine to Sit;Sit to Sidelying     Supine to sit: +2 for physical assistance;Min assist   Sit to sidelying: +2 for physical assistance;Mod assist General bed mobility comments: Assist to bring legs off of bed, elevate trunk into sitting and bring hips to EOB. Assist to lower trunk and bring legs back up into bed returning to sidelying  Transfers Overall transfer level: Needs assistance Equipment used: 2 person hand held assist Transfers: Sit to/from Stand Sit to Stand: +2 physical assistance;Mod assist         General transfer comment: Assist to bring hips up. Pt with flexed posture in standing and didn't achieve full hip, trunk, and knee extension. Stood x 2 with first stand for ~15 seconds and second stand ~5 seconds  Ambulation/Gait                Stairs            Wheelchair Mobility    Modified Rankin (Stroke Patients Only)       Balance Overall balance assessment: Needs  assistance Sitting-balance support: Bilateral upper extremity supported;Feet supported Sitting balance-Leahy Scale: Poor Sitting balance - Comments: UE support   Standing balance support: Bilateral upper extremity supported Standing balance-Leahy Scale: Poor Standing balance comment: BUE support and +2 mod assist for brief static standing                             Pertinent Vitals/Pain Pain Assessment: No/denies pain    Home Living Family/patient expects to be discharged to:: Skilled nursing facility                 Additional Comments: Long term resident    Prior Function Level of Independence: Needs assistance   Gait / Transfers Assistance Needed: Has been using lift to w/c since last admission.            Hand Dominance   Dominant Hand: Right    Extremity/Trunk Assessment   Upper Extremity Assessment Upper Extremity Assessment: Defer to OT evaluation    Lower Extremity Assessment Lower Extremity Assessment: Generalized weakness       Communication   Communication: No difficulties  Cognition Arousal/Alertness: Awake/alert Behavior During Therapy: WFL for tasks assessed/performed Overall Cognitive Status: No family/caregiver present to determine baseline cognitive functioning                                 General  Comments: Pt with baseline cognitive deficits. Pt following commands consistently. Likely close to baseline.      General Comments      Exercises     Assessment/Plan    PT Assessment Patient needs continued PT services  PT Problem List Decreased strength;Decreased activity tolerance;Decreased balance;Decreased mobility;Decreased knowledge of use of DME;Obesity       PT Treatment Interventions DME instruction;Gait training;Functional mobility training;Therapeutic activities;Therapeutic exercise;Balance training;Patient/family education    PT Goals (Current goals can be found in the Care Plan section)   Acute Rehab PT Goals Patient Stated Goal: return to [facility] PT Goal Formulation: With patient Time For Goal Achievement: 06/05/18 Potential to Achieve Goals: Fair    Frequency Min 2X/week   Barriers to discharge        Co-evaluation PT/OT/SLP Co-Evaluation/Treatment: Yes Reason for Co-Treatment: Complexity of the patient's impairments (multi-system involvement);For patient/therapist safety PT goals addressed during session: Mobility/safety with mobility;Balance         AM-PAC PT "6 Clicks" Mobility  Outcome Measure Help needed turning from your back to your side while in a flat bed without using bedrails?: A Little Help needed moving from lying on your back to sitting on the side of a flat bed without using bedrails?: A Little Help needed moving to and from a bed to a chair (including a wheelchair)?: Total Help needed standing up from a chair using your arms (e.g., wheelchair or bedside chair)?: Total Help needed to walk in hospital room?: Total Help needed climbing 3-5 steps with a railing? : Total 6 Click Score: 10    End of Session Equipment Utilized During Treatment: Gait belt Activity Tolerance: Patient tolerated treatment well Patient left: in bed;with call bell/phone within reach;with chair alarm set;with nursing/sitter in room Nurse Communication: Mobility status PT Visit Diagnosis: Other abnormalities of gait and mobility (R26.89);Muscle weakness (generalized) (M62.81)    Time: 3685-9923 PT Time Calculation (min) (ACUTE ONLY): 22 min   Charges:   PT Evaluation $PT Eval Moderate Complexity: [place] Pager [phone] Office [place] 05/22/2018, 12:23 PM

## 2018-05-22 NOTE — Progress Notes (Signed)
[place] Progress Note Patient Name: Eddie Davies DOB: 1970-04-04 MRN: [id number]   Date of Service  05/22/2018  HPI/Events of Note  Anemia - Hgb = 6.3.    eICU Interventions  Will order: 1. Transfuse 1 unit PRBC now.      Intervention Category Major Interventions: Other:  Olene Godfrey Cornelia Copa 05/22/2018, 6:11 AM

## 2018-05-22 NOTE — Progress Notes (Signed)
CRITICAL VALUE ALERT  Critical Value:  Hb 6.3  Date & Time Notied:  05/22/2018  Provider Notified: Dr. Emmit Alexanders  Orders Received/Actions taken: Awaiting new order

## 2018-05-22 NOTE — Progress Notes (Addendum)
PROGRESS NOTE    Eddie Davies  ZHG:[id number] DOB: 01/04/1971 DOA: 05/19/2018 PCP: [facility]   Brief Narrative:  Per HPI Eddie Davies is a 48 y.o. male with medical history significant of CKD stage 3, presumed liver cirrhosis, hypertension, seizures presenting with AMS.    He was hospitalized from 3/8-21 and 3/28-4/11 for acute metabolic encephalopathy secondary to septic shock from ESBL positive Klebsiella and E faecalis UTI with subsequent hypoxic respiratory failure in the setting of ESBL PNA, treated with meropenem and requiring mechanical ventilation and CRRT during hospitalizations.   ED Course:   Encephalopathy, UTI, AKI.  Complex patient, recently discharged.  Worsening confusion, elevated creatinine and NH4 levels.  Hypotensive on arrival, rectal temp 88, transiently hypoxic. Labs with UTI.  Creatinine up to 8.11, BUN 64.  Hgb is low but stable.  CXR with vascular congestion, mild.  Pharmacy suggested Cefepime and Vanc was reasonable.  MRSA PCR is pending.  Given mild IVF.  BP 91/51.  Has not given sepsis bolus.  PCCM to evaluate to determine if admission to their service is needed and she will let me know.  Patient was admitted to PCCM with acute renal failure and metabolic acidosis.  Received CRRT in ICU by renal.  Renal function and acidosis improved.  Transferred to [facility] 4/28.  Assessment & Plan:   Active Problems:   Metabolic acidosis  AKI on CKD III Baseline creatinine ~ 1.8 from 04/2018 Creatinine peaked to 8.34, now rising again Per renal, thought likely ATN S/p CRRT, planning to follow daily for HD needs Has temp HD cath, was oozing earlier, bleeding improved Appreciate renal recs  Acute Metabolic Encephalopathy Hx Seizures Delirium precautions Continue tegretol, phenobarb Gabapentin currently on hold  Reported History of Cirrhosis Pt on rifaximin and lactulose at home, these were d/c'd in setting of normal appearing liver on CT by  PCCM. Pt with low albumin, normal bili, low platelets, mildly elevated INR.  Would continue to monitor, closely.  BPH Continue flomax  Anemia:  S/p 1 unit pRBC on 4/27 S/p 2 units during hospitalization with 1 unit FFP  Sepsis ruled out. abx discontinued Blood cx NGTD Urine cx ng  Hx of HTN, HLD. - hold norvasc - resume zocor  DM type II. - SSI  Thrombocytopenia: Continue to monitor, follow  DVT prophylaxis: SCD Code Status: full  Family Communication: none at bedside - called listed contact, no answer Disposition Plan: pending  Consultants:  PCCM Nephrology  Procedures:   R IJ HD cath 4/26  Antimicrobials:  Anti-infectives (From admission, onward)   Start     Dose/Rate Route Frequency Ordered Stop   05/22/18 2200  rifaximin (XIFAXAN) tablet 550 mg  Status:  Discontinued    Note to Pharmacy:  For diarrhea     550 mg Oral 2 times daily 05/22/18 1732 05/22/18 1737   05/20/18 1800  ceFEPIme (MAXIPIME) 1 g in sodium chloride 0.9 % 100 mL IVPB  Status:  Discontinued     1 g 200 mL/hr over 30 Minutes Intravenous Every 24 hours 05/19/18 1122 05/19/18 1855   05/20/18 1200  vancomycin (VANCOCIN) 1,250 mg in sodium chloride 0.9 % 250 mL IVPB  Status:  Discontinued     1,250 mg 166.7 mL/hr over 90 Minutes Intravenous Every 24 hours 05/19/18 1855 05/21/18 0839   05/20/18 0500  ceFEPIme (MAXIPIME) 2 g in sodium chloride 0.9 % 100 mL IVPB  Status:  Discontinued     2 g 200 mL/hr over  30 Minutes Intravenous Every 12 hours 05/19/18 2120 05/21/18 0839   05/19/18 2300  ceFEPIme (MAXIPIME) 2 g in sodium chloride 0.9 % 100 mL IVPB  Status:  Discontinued     2 g 200 mL/hr over 30 Minutes Intravenous Every 12 hours 05/19/18 1855 05/19/18 2120   05/19/18 2200  rifaximin (XIFAXAN) tablet 550 mg  Status:  Discontinued     550 mg Oral 2 times daily 05/19/18 1606 05/21/18 0839   05/19/18 1404  vancomycin variable dose per unstable renal function (pharmacist dosing)  Status:   Discontinued      Does not apply See admin instructions 05/19/18 1405 05/19/18 1855   05/19/18 1000  vancomycin (VANCOCIN) IVPB 1000 mg/200 mL premix  Status:  Discontinued     1,000 mg 200 mL/hr over 60 Minutes Intravenous  Once 05/19/18 0955 05/19/18 1000   05/19/18 1000  ceFEPIme (MAXIPIME) 2 g in sodium chloride 0.9 % 100 mL IVPB     2 g 200 mL/hr over 30 Minutes Intravenous  Once 05/19/18 0955 05/19/18 1111   05/19/18 1000  vancomycin (VANCOCIN) 2,000 mg in sodium chloride 0.9 % 500 mL IVPB     2,000 mg 250 mL/hr over 120 Minutes Intravenous  Once 05/19/18 1000 05/19/18 1422     Subjective: Doing ok.  Denies pain. A bit disoriented.  Objective: Vitals:   05/22/18 0819 05/22/18 0830 05/22/18 0900 05/22/18 1000  BP:  (!) 137/113 (!) 141/100 (!) 165/94  Pulse:  73 66 79  Resp:  17 14 16   Temp: 98.9 F (37.2 C) 99 F (37.2 C)    TempSrc: Oral Oral    SpO2:  99% 100% 100%  Weight:      Height:        Intake/Output Summary (Last 24 hours) at 05/22/2018 1254 Last data filed at 05/22/2018 1230 Gross per 24 hour  Intake 955 ml  Output 237 ml  Net 718 ml   Filed Weights   05/20/18 0500 05/21/18 0421 05/22/18 0500  Weight: 128 kg 130 kg 126.1 kg    Examination:  General exam: Appears calm and comfortable  Respiratory system: Clear to auscultation. Respiratory effort normal. Cardiovascular system: S1 & S2 heard, RRR.  R IJ.  Gastrointestinal system: Abdomen is nondistended, soft and nontender Central nervous system: Alert and disoriented. No focal neurological deficits. Extremities: no lee   Data Reviewed: I have personally reviewed following labs and imaging studies  CBC: Recent Labs  Lab 05/19/18 0936 05/19/18 1309 05/20/18 0419 05/21/18 0419 05/22/18 0522  WBC 4.6  --  3.4* 3.2* 3.8*  NEUTROABS 3.1  --   --  2.2  --   HGB 7.2* 6.8* 7.1* 7.1* 6.3*  HCT 23.8* 20.0* 22.6* 22.1* 19.7*  MCV 98.8  --  93.8 92.9 92.9  PLT 64*  --  55* 53* 60*   Basic  Metabolic Panel: Recent Labs  Lab 05/19/18 1620 05/20/18 0419 05/20/18 1521 05/21/18 0419 05/22/18 0522  NA 139 142 138 139 136  K 4.6 4.0 3.7 3.8 3.8  CL 114* 112* 110 108 107  CO2 11* 17* 19* 23 19*  GLUCOSE 181* 117* 152* 95 140*  BUN 65* 51* 35* 25* 27*  CREATININE 8.34* 6.65* 4.77* 3.67* 4.28*  CALCIUM 6.9* 7.1* 6.9* 7.0* 7.0*  MG  --  2.8*  --  2.5* 2.5*  PHOS  --  10.1* 6.1* 4.7* 5.0*   GFR: Estimated Creatinine Clearance: 29.7 mL/min (A) (by C-G formula based on SCr of 4.28  mg/dL (H)). Liver Function Tests: Recent Labs  Lab 05/19/18 0936 05/20/18 0419 05/20/18 1521 05/21/18 0419 05/22/18 0522  AST 12*  --   --  13*  --   ALT 16  --   --  15  --   ALKPHOS 140*  --   --  123  --   BILITOT 0.5  --   --  0.5  --   PROT 6.2*  --   --  5.6*  --   ALBUMIN 2.8* 2.6* 2.4* 2.4* 2.4*   Recent Labs  Lab 05/20/18 0419  LIPASE 20   Recent Labs  Lab 05/19/18 0943 05/21/18 0415  AMMONIA 48* 37*   Coagulation Profile: Recent Labs  Lab 05/19/18 1339 05/21/18 0419  INR 1.6* 1.7*   Cardiac Enzymes: No results for input(s): CKTOTAL, CKMB, CKMBINDEX, TROPONINI in the last 168 hours. BNP (last 3 results) No results for input(s): PROBNP in the last 8760 hours. HbA1C: No results for input(s): HGBA1C in the last 72 hours. CBG: Recent Labs  Lab 05/21/18 2117 05/21/18 2317 05/22/18 0343 05/22/18 0730 05/22/18 1204  GLUCAP 97 113* 156* 134* 218*   Lipid Profile: No results for input(s): CHOL, HDL, LDLCALC, TRIG, CHOLHDL, LDLDIRECT in the last 72 hours. Thyroid Function Tests: No results for input(s): TSH, T4TOTAL, FREET4, T3FREE, THYROIDAB in the last 72 hours. Anemia Panel: No results for input(s): VITAMINB12, FOLATE, FERRITIN, TIBC, IRON, RETICCTPCT in the last 72 hours. Sepsis Labs: Recent Labs  Lab 05/19/18 0941  LATICACIDVEN 1.3    Recent Results (from the past 240 hour(s))  Blood Culture (routine x 2)     Status: None (Preliminary result)    Collection Time: 05/19/18  9:34 AM  Result Value Ref Range Status   Specimen Description BLOOD LEFT ANTECUBITAL  Final   Special Requests   Final    BOTTLES DRAWN AEROBIC AND ANAEROBIC Blood Culture adequate volume   Culture   Final    NO GROWTH 3 DAYS Performed at [facility], [address] [place], [place] [address]    Report Status PENDING  Incomplete  Urine culture     Status: None   Collection Time: 05/19/18  9:41 AM  Result Value Ref Range Status   Specimen Description URINE, RANDOM  Final   Special Requests NONE  Final   Culture   Final    NO GROWTH Performed at [facility], [address] [place], [place] [address]    Report Status 05/20/2018 FINAL  Final  SARS Coronavirus 2 [facility] order, Performed in [place] hospital lab)     Status: None   Collection Time: 05/19/18  9:44 AM  Result Value Ref Range Status   SARS Coronavirus 2 NEGATIVE NEGATIVE Final    Comment: (NOTE) If result is NEGATIVE SARS-CoV-2 target nucleic acids are NOT DETECTED. The SARS-CoV-2 RNA is generally detectable in upper and lower  respiratory specimens during the acute phase of infection. The lowest  concentration of SARS-CoV-2 viral copies this assay can detect is 250  copies / mL. A negative result does not preclude SARS-CoV-2 infection  and should not be used as the sole basis for treatment or other  patient management decisions.  A negative result may occur with  improper specimen collection / handling, submission of specimen other  than nasopharyngeal swab, presence of viral mutation(s) within the  areas targeted by this assay, and inadequate number of viral copies  (<250 copies / mL). A negative result must be  combined with clinical  observations, patient history, and epidemiological information. If result is POSITIVE SARS-CoV-2 target nucleic acids are DETECTED. The SARS-CoV-2 RNA is generally detectable in upper and lower  respiratory specimens dur ing the acute  phase of infection.  Positive  results are indicative of active infection with SARS-CoV-2.  Clinical  correlation with patient history and other diagnostic information is  necessary to determine patient infection status.  Positive results do  not rule out bacterial infection or co-infection with other viruses. If result is PRESUMPTIVE POSTIVE SARS-CoV-2 nucleic acids MAY BE PRESENT.   A presumptive positive result was obtained on the submitted specimen  and confirmed on repeat testing.  While 2019 novel coronavirus  (SARS-CoV-2) nucleic acids may be present in the submitted sample  additional confirmatory testing may be necessary for epidemiological  and / or clinical management purposes  to differentiate between  SARS-CoV-2 and other Sarbecovirus currently known to infect humans.  If clinically indicated additional testing with an alternate test  methodology [phone]) is advised. The SARS-CoV-2 RNA is generally  detectable in upper and lower respiratory sp ecimens during the acute  phase of infection. The expected result is Negative. Fact Sheet for Patients:  StrictlyIdeas.no Fact Sheet for Healthcare Providers: BankingDealers.co.za This test is not yet approved or cleared by the [place] FDA and has been authorized for detection and/or diagnosis of SARS-CoV-2 by FDA under an Emergency Use Authorization (EUA).  This EUA will remain in effect (meaning this test can be used) for the duration of the COVID-19 declaration under Section 564(b)(1) of the Act, 21 U.S.C. section 360bbb-3(b)(1), unless the authorization is terminated or revoked sooner. Performed at [facility], [place] [address], [place], [place] [address]   MRSA PCR Screening     Status: None   Collection Time: 05/19/18 10:15 AM  Result Value Ref Range Status   MRSA by PCR NEGATIVE NEGATIVE Final    Comment:        The GeneXpert MRSA Assay (FDA approved for NASAL  specimens only), is one component of a comprehensive MRSA colonization surveillance program. It is not intended to diagnose MRSA infection nor to guide or monitor treatment for MRSA infections. Performed at [facility], [place] [address], [place], [place] [address]   Blood Culture (routine x 2)     Status: None (Preliminary result)   Collection Time: 05/19/18  4:27 PM  Result Value Ref Range Status   Specimen Description BLOOD RIGHT HAND  Final   Special Requests AEROBIC BOTTLE ONLY Blood Culture adequate volume  Final   Culture   Final    NO GROWTH 3 DAYS Performed at [facility], [place] [address] [place], [place] [address]    Report Status PENDING  Incomplete         Radiology Studies: Dg Chest Port 1 View  Result Date: 05/21/2018 CLINICAL DATA:  Abnormal respiration. EXAM: PORTABLE CHEST 1 VIEW COMPARISON:  Radiograph of May 19, 2018. FINDINGS: Stable cardiomegaly. Right internal jugular catheter is unchanged in position. No pneumothorax is noted. Left lung base is not included in field-of-view. No definite pleural effusion is seen within the visualized lung fields. Bony thorax is unremarkable. No consolidative process is noted. IMPRESSION: Stable cardiomegaly.  No acute pulmonary abnormality seen. Electronically Signed   By: Marijo Conception M.D.   On: 05/21/2018 07:50        Scheduled Meds: . carbamazepine  700 mg Oral BID  . ferrous sulfate  325  mg Oral Q breakfast  . insulin aspart  0-5 Units Subcutaneous QHS  . insulin aspart  0-9 Units Subcutaneous TID WC  . mouth rinse  15 mL Mouth Rinse BID  . PHENobarbital  243 mg Oral QHS  . simvastatin  20 mg Oral QHS  . sodium chloride flush  10 mL Intravenous Q12H  . tamsulosin  0.4 mg Oral QHS   Continuous Infusions:   LOS: 3 days    Time spent: over 30 min    Fayrene Helper, MD [facility] Pager AMION  If 7PM-7AM, please contact night-coverage [email or web address] Password [facility] 05/22/2018,  12:54 PM

## 2018-05-22 NOTE — Progress Notes (Signed)
Patient transferred from ICU at 1820hrs.  Incontinent of large amount of liquid stool.  Pericare and foley care given. Oriented to unit.

## 2018-05-22 NOTE — Evaluation (Signed)
Occupational Therapy Evaluation Patient Details Name: Eddie Davies MRN: [id number] DOB: [birth year] Today's Date: 05/22/2018    History of Present Illness Pt adm with acute on chronic renal failure and started on CRRT on 4/25. Stopped CRRT on 4/27. PMH - recent admissions for respiratory failure with vent and septic shock, DM, seizures, htn, esrd, cognitive delays, cirrhosis   Clinical Impression   This 48 yo male admitted with above presents to acute OT with decreased balance, decreased mobility, obesity, decreased strength all affecting his safety and independence with basic ADLs. He will benefit from acute OT with follow up OT back at SNF.    Follow Up Recommendations  SNF;Supervision/Assistance - 24 hour    Equipment Recommendations  None recommended by OT       Precautions / Restrictions Precautions Precautions: Fall Restrictions Weight Bearing Restrictions: No      Mobility Bed Mobility Overal bed mobility: Needs Assistance Bed Mobility: Supine to Sit;Sit to Sidelying     Supine to sit: +2 for physical assistance;Min assist   Sit to sidelying: +2 for physical assistance;Mod assist General bed mobility comments: Assist to bring legs off of bed, elevate trunk into sitting and bring hips to EOB. Assist to lower trunk and bring legs back up into bed returning to sidelying  Transfers Overall transfer level: Needs assistance Equipment used: 2 person hand held assist Transfers: Sit to/from Stand Sit to Stand: +2 physical assistance;Mod assist         General transfer comment: Assist to bring hips up. Pt with flexed posture in standing and didn't achieve full hip, trunk, and knee extension. Stood x 2 with first stand for ~15 seconds and second stand ~5 seconds    Balance Overall balance assessment: Needs assistance Sitting-balance support: Bilateral upper extremity supported;Feet supported Sitting balance-Leahy Scale: Poor Sitting balance - Comments: UE support    Standing balance support: Bilateral upper extremity supported Standing balance-Leahy Scale: Poor Standing balance comment: BUE support and +2 mod assist for brief static standing                           ADL either performed or assessed with clinical judgement   ADL Overall ADL's : Needs assistance/impaired Eating/Feeding: Set up;Sitting   Grooming: Set up;Supervision/safety;Sitting   Upper Body Bathing: Minimal assistance;Sitting   Lower Body Bathing: Maximal assistance Lower Body Bathing Details (indicate cue type and reason): Mod A +2 sit<>stand from bed Upper Body Dressing : Moderate assistance;Sitting   Lower Body Dressing: Total assistance Lower Body Dressing Details (indicate cue type and reason): Mod A +2 sit<>stand from bed                     Vision Baseline Vision/History: No visual deficits              Pertinent Vitals/Pain Pain Assessment: No/denies pain     Hand Dominance Right   Extremity/Trunk Assessment Upper Extremity Assessment Upper Extremity Assessment: Generalized weakness   Lower Extremity Assessment Lower Extremity Assessment: Generalized weakness       Communication Communication Communication: No difficulties   Cognition Arousal/Alertness: Awake/alert Behavior During Therapy: WFL for tasks assessed/performed Overall Cognitive Status: No family/caregiver present to determine baseline cognitive functioning                                 General Comments: Pt with baseline cognitive deficits. Pt following  commands consistently. Likely close to baseline.              Home Living Family/patient expects to be discharged to:: Skilled nursing facility                                 Additional Comments: Long term resident      Prior Functioning/Environment Level of Independence: Needs assistance  Gait / Transfers Assistance Needed: Has been using lift to w/c since last admission.                OT Problem List: Decreased strength;Decreased activity tolerance;Impaired balance (sitting and/or standing);Decreased safety awareness;Decreased cognition;Obesity;Decreased knowledge of use of DME or AE      OT Treatment/Interventions: Self-care/ADL training;DME and/or AE instruction;Therapeutic activities;Patient/family education;Balance training    OT Goals(Current goals can be found in the care plan section) Acute Rehab OT Goals Patient Stated Goal: return to [facility] [place] OT Goal Formulation: With patient Time For Goal Achievement: 06/05/18 Potential to Achieve Goals: Good  OT Frequency: Min 2X/week           Co-evaluation PT/OT/SLP Co-Evaluation/Treatment: Yes Reason for Co-Treatment: Complexity of the patient's impairments (multi-system involvement);For patient/therapist safety;Necessary to address cognition/behavior during functional activity PT goals addressed during session: Mobility/safety with mobility;Balance OT goals addressed during session: ADL's and self-care;Strengthening/ROM      AM-PAC OT "6 Clicks" Daily Activity     Outcome Measure Help from another person eating meals?: A Little Help from another person taking care of personal grooming?: A Little Help from another person toileting, which includes using toliet, bedpan, or urinal?: Total Help from another person bathing (including washing, rinsing, drying)?: A Lot Help from another person to put on and taking off regular upper body clothing?: A Lot Help from another person to put on and taking off regular lower body clothing?: Total 6 Click Score: 12   End of Session Equipment Utilized During Treatment: Gait belt Nurse Communication: (RN in room when we got pt ot EOB and stood pt)  Activity Tolerance: Patient tolerated treatment well Patient left: in bed;with call bell/phone within reach;with chair alarm set;with family/visitor present(NT in room helping pt with lunch-asked her to put UE  restraints back on once pt is finshed with lunch)  OT Visit Diagnosis: Unsteadiness on feet (R26.81);Other abnormalities of gait and mobility (R26.89);Muscle weakness (generalized) (M62.81);Other symptoms and signs involving cognitive function                Time: 3300-7622 OT Time Calculation (min): 23 min Charges:  OT General Charges $OT Visit: 1 Visit OT Evaluation $OT Eval Moderate Complexity: 1 Mod [place], OTR/L Acute [employer] Pager [phone] Office [phone]     Almon Register 05/22/2018, 3:48 PM

## 2018-05-23 DIAGNOSIS — R069 Unspecified abnormalities of breathing: Secondary | ICD-10-CM

## 2018-05-23 LAB — RENAL FUNCTION PANEL
Albumin: 2.5 g/dL — ABNORMAL LOW (ref 3.5–5.0)
Albumin: 2.4 g/dL — ABNORMAL LOW (ref 3.5–5.0)
Anion gap: 10 (ref 5–15)
Anion gap: 10 (ref 5–15)
BUN: 39 mg/dL — ABNORMAL HIGH (ref 6–20)
BUN: 42 mg/dL — ABNORMAL HIGH (ref 6–20)
CO2: 18 mmol/L — ABNORMAL LOW (ref 22–32)
CO2: 16 mmol/L — ABNORMAL LOW (ref 22–32)
Calcium: 7.4 mg/dL — ABNORMAL LOW (ref 8.9–10.3)
Calcium: 7.3 mg/dL — ABNORMAL LOW (ref 8.9–10.3)
Chloride: 107 mmol/L (ref 98–111)
Chloride: 109 mmol/L (ref 98–111)
Creatinine, Ser: 5.47 mg/dL — ABNORMAL HIGH (ref 0.61–1.24)
Creatinine, Ser: 5.75 mg/dL — ABNORMAL HIGH (ref 0.61–1.24)
GFR calc Af Amer: 13 mL/min — ABNORMAL LOW (ref >60)
GFR calc Af Amer: 12 mL/min — ABNORMAL LOW (ref >60)
GFR calc non Af Amer: 11 mL/min — ABNORMAL LOW (ref >60)
GFR calc non Af Amer: 11 mL/min — ABNORMAL LOW (ref >60)
Glucose, Bld: 174 mg/dL — ABNORMAL HIGH (ref 70–99)
Glucose, Bld: 283 mg/dL — ABNORMAL HIGH (ref 70–99)
Phosphorus: 5.9 mg/dL — ABNORMAL HIGH (ref 2.5–4.6)
Phosphorus: 6.5 mg/dL — ABNORMAL HIGH (ref 2.5–4.6)
Potassium: 3.8 mmol/L (ref 3.5–5.1)
Potassium: 3.8 mmol/L (ref 3.5–5.1)
Sodium: 135 mmol/L (ref 135–145)
Sodium: 135 mmol/L (ref 135–145)

## 2018-05-23 LAB — GLUCOSE, CAPILLARY
Glucose-Capillary: 175 mg/dL — ABNORMAL HIGH (ref 70–99)
Glucose-Capillary: 275 mg/dL — ABNORMAL HIGH (ref 70–99)
Glucose-Capillary: 223 mg/dL — ABNORMAL HIGH (ref 70–99)
Glucose-Capillary: 255 mg/dL — ABNORMAL HIGH (ref 70–99)
Glucose-Capillary: 260 mg/dL — ABNORMAL HIGH (ref 70–99)

## 2018-05-23 LAB — TYPE AND SCREEN
ABO/RH(D): A POS
Antibody Screen: NEGATIVE
Unit division: 0
Unit division: 0

## 2018-05-23 LAB — BPAM RBC
Blood Product Expiration Date: 202005022359
Blood Product Expiration Date: 202005042359
ISSUE DATE / TIME: 202004252248
ISSUE DATE / TIME: 202004280642
Unit Type and Rh: 6200
Unit Type and Rh: 6200

## 2018-05-23 LAB — MAGNESIUM: Magnesium: 2.6 mg/dL — ABNORMAL HIGH (ref 1.7–2.4)

## 2018-05-23 LAB — PHENOBARBITAL LEVEL: Phenobarbital: 11 ug/mL — ABNORMAL LOW (ref 15.0–30.0)

## 2018-05-23 MED ORDER — AMLODIPINE BESYLATE 5 MG PO TABS: 5.0000 mg | ORAL_TABLET | Freq: Every day | ORAL | Status: DC

## 2018-05-23 MED ORDER — INSULIN GLARGINE 100 UNIT/ML ~~LOC~~ SOLN
10.0000 [IU] | Freq: Every day | SUBCUTANEOUS | Status: DC
  Administered 2018-05-23: 23:00:00 10 [IU] via SUBCUTANEOUS
  Filled 2018-05-23 (×2): qty 0.1

## 2018-05-23 MED ORDER — AMLODIPINE BESYLATE 5 MG PO TABS
5.0000 mg | ORAL_TABLET | Freq: Every day | ORAL | Status: DC
  Administered 2018-05-23 – 2018-05-28 (×6): 5 mg via ORAL
  Filled 2018-05-23 (×10): qty 1

## 2018-05-23 MED ORDER — HEPARIN SODIUM (PORCINE) 5000 UNIT/ML IJ SOLN
5000.0000 [IU] | Freq: Three times a day (TID) | INTRAMUSCULAR | Status: DC
  Administered 2018-05-23 – 2018-05-28 (×13): 5000 [IU] via SUBCUTANEOUS
  Filled 2018-05-23 (×12): qty 1

## 2018-05-23 MED ORDER — INSULIN ASPART 100 UNIT/ML ~~LOC~~ SOLN
0.0000 [IU] | SUBCUTANEOUS | Status: DC
  Administered 2018-05-23: 3 [IU] via SUBCUTANEOUS
  Administered 2018-05-24: 1 [IU] via SUBCUTANEOUS
  Administered 2018-05-24: 3 [IU] via SUBCUTANEOUS
  Administered 2018-05-24: 2 [IU] via SUBCUTANEOUS
  Administered 2018-05-25: 3 [IU] via SUBCUTANEOUS
  Administered 2018-05-25: 5 [IU] via SUBCUTANEOUS
  Administered 2018-05-25: 3 [IU] via SUBCUTANEOUS
  Administered 2018-05-25 – 2018-05-26 (×2): 1 [IU] via SUBCUTANEOUS
  Administered 2018-05-26: 2 [IU] via SUBCUTANEOUS
  Administered 2018-05-26 – 2018-05-27 (×3): 1 [IU] via SUBCUTANEOUS
  Administered 2018-05-27 (×2): 2 [IU] via SUBCUTANEOUS
  Administered 2018-05-27: 1 [IU] via SUBCUTANEOUS
  Administered 2018-05-27: 2 [IU] via SUBCUTANEOUS
  Administered 2018-05-28 – 2018-05-29 (×2): 1 [IU] via SUBCUTANEOUS

## 2018-05-23 NOTE — Progress Notes (Signed)
Admit: 05/19/2018 LOS: 4  89M Cirrhosis, CKD (Labile SCr, perhaps SCr as low as 1.9) admitted with AMS and AKI.  Recent [facility] admit for pneumonia / VDRF  Subjective:  Marland Kitchen SCr slight inc t o 5.47, K 3.8, HCO3 18 . 0.3L UOP . No c/o, tol PO, taking in fluids . No SOB or CP . Temp HD cath stable, no bleeding   04/28 0701 - 04/29 0700 In: 1092 [P.O.:762; Blood:330] Out: 336 [Urine:335; Stool:1]  Filed Weights   05/22/18 0500 05/22/18 1820 05/23/18 0536  Weight: 126.1 kg 126.6 kg 55.4 kg    Scheduled Meds: . carbamazepine  700 mg Oral BID  . ferrous sulfate  325 mg Oral Q breakfast  . insulin aspart  0-5 Units Subcutaneous QHS  . insulin aspart  0-9 Units Subcutaneous TID WC  . mouth rinse  15 mL Mouth Rinse BID  . PHENobarbital  243 mg Oral QHS  . simvastatin  20 mg Oral QHS  . sodium chloride flush  10 mL Intravenous Q12H  . tamsulosin  0.4 mg Oral QHS   Continuous Infusions: PRN Meds:.heparin, ondansetron (ZOFRAN) IV, sodium chloride flush  Current Labs: reviewed    Physical Exam:  Blood pressure (!) 160/81, pulse (!) 59, temperature 98.5 F (36.9 C), temperature source Oral, resp. rate (!) 28, height 6' 2"  (1.88 m), weight 55.4 kg, SpO2 100 %. Obese, NAD RRR nl s1s2 CTAB Ichythosis present No sig LEE S/nt/nd Nonfocal Foley cath in place  A 1. Dialysis dependent AKI, labile baseline CKD; CT A/P at admit with b/l nonobstructive nephrolithiasis; probably ATN 2. Cirrhosis 3. Morbid obesity 4. Long term SNF resident 5. B/l nonobstructive nephrolithiasis 6. Anemia 7. Hx/o seizures 8. L IJ Temp HD cath, sig oozing 9. AMS resolved 10. Metabolic acidosis, stable 11. Temp R IJ Cath present  P . Cont supportive care, follow daily for HD needs . Ad Lib PO fluids s/p 1u PRBC today . Medication Issues; o Preferred narcotic agents for pain control are hydromorphone, fentanyl, and methadone. Morphine should not be used.  o Baclofen should be avoided o Avoid oral sodium  phosphate and magnesium citrate based laxatives / bowel preps    Pearson Grippe MD 05/23/2018, 1:26 PM  Recent Labs  Lab 05/22/18 0522 05/22/18 1940 05/23/18 0613  NA 136 135 135  K 3.8 4.1 3.8  CL 107 107 107  CO2 19* 19* 18*  GLUCOSE 140* 276* 174*  BUN 27* 35* 39*  CREATININE 4.28* 5.09* 5.47*  CALCIUM 7.0* 7.2* 7.4*  PHOS 5.0* 5.6* 5.9*   Recent Labs  Lab 05/19/18 0936  05/20/18 0419 05/21/18 0419 05/22/18 0522 05/22/18 1230  WBC 4.6  --  3.4* 3.2* 3.8*  --   NEUTROABS 3.1  --   --  2.2  --   --   HGB 7.2*   < > 7.1* 7.1* 6.3* 7.1*  HCT 23.8*   < > 22.6* 22.1* 19.7* 21.4*  MCV 98.8  --  93.8 92.9 92.9  --   PLT 64*  --  55* 53* 60*  --    < > = values in this interval not displayed.

## 2018-05-23 NOTE — Progress Notes (Signed)
Inpatient Diabetes Program Recommendations  AACE/ADA: New Consensus Statement on Inpatient Glycemic Control (2015)  Target Ranges:  Prepandial:   less than 140 mg/dL      Peak postprandial:   less than 180 mg/dL (1-2 hours)      Critically ill patients:  140 - 180 mg/dL   Results for SHALEV, HELMINIAK (MRN [id number]) as of 05/23/2018 11:14  Ref. Range 05/22/2018 07:30 05/22/2018 12:04 05/22/2018 15:39 05/22/2018 21:14 05/23/2018 08:15  Glucose-Capillary Latest Ref Range: 70 - 99 mg/dL 134 (H) Novolog 1 units given 218 (H) Novolog 3 units given 196 (H) Novolog 2 units given 276 (H) Novolog 3 units given 175 (H) Novolog 2 units given    Review of Glycemic Control  Diabetes history: DM 2 Outpatient Diabetes medications: Basaglar 17 BID + Novolog 0-15 TID per SSI + Tradjenta 5 mg QD Current orders for Inpatient glycemic control:  Novolog Sensitive Correction 0-9 units tid + hs scale  Inpatient Diabetes Program Recommendations:    Patient eating 100% of meals. Glucose increases after meals.   Consider Novolog 2 units tid meal coverage if patient consumes at least 50% of meals.  Thanks,  Tama Headings RN, MSN, BC-ADM Inpatient Diabetes Coordinator Team Pager [phone] (8a-5p)

## 2018-05-23 NOTE — Progress Notes (Signed)
Removed flexiseal d/t pt having large bm around tube multiple times today. Carroll Kinds RN

## 2018-05-23 NOTE — Progress Notes (Signed)
PROGRESS NOTE    Eddie Davies  XNT:[id number] DOB: Aug 11, 1970 DOA: 05/19/2018 PCP: [facility]   Brief Narrative:  48 y.o. WM PMHx Seizures, CKD stage 3, presumed liver cirrhosis, HTN, hypertension,   Presenting with AMS. He was hospitalized from 3/8-21 and 3/28-4/11 for acute metabolic encephalopathy secondary to septic shock from ESBL positive Klebsiella and E faecalis UTI with subsequent hypoxic respiratory failure in the setting of ESBL PNA, treated with meropenem and requiring mechanical ventilation and CRRT during hospitalizations.    ED Course:   Encephalopathy, UTI, AKI.  Complex patient, recently discharged.  Worsening confusion, elevated creatinine and NH4 levels.  Hypotensive on arrival, rectal temp 88, transiently hypoxic. Labs with UTI.  Creatinine up to 8.11, BUN 64.  Hgb is low but stable.  CXR with vascular congestion, mild.  Pharmacy suggested Cefepime and Vanc was reasonable.  MRSA PCR is pending.  Given mild IVF.  BP 91/51.  Has not given sepsis bolus.  PCCM to evaluate to determine if admission to their service is needed and she will let me know.   Patient was admitted to PCCM with acute renal failure and metabolic acidosis.  Received CRRT in ICU by renal.  Renal function and acidosis improved.  Transferred to [facility] 4/28.    Subjective: 4/29 A/O x2 (does not know when, why).  Very sleepy will only engage you with significant stimulation.   Assessment & Plan:   Active Problems:   Metabolic acidosis   Confusion  Acute on CKD stage III (baseline Cr ~1.8) -Cr peak= 8.34, now rising again - Per nephrology most likely secondary to ATN - S/p CRRT.  Nephrology plans to follow with daily HD -Strict in and out +3.0 L -Daily weight Filed Weights   05/22/18 0500 05/22/18 1820 05/23/18 0536  Weight: 126.1 kg 126.6 kg 55.4 kg    Acute metabolic encephalopathy -Unsure of baseline but patient was able to answer simple questions today. -Fall  precautions  Hx seizures -Carbamazepine 700 mg twice daily - Phenobarbital 243 mg nightly -Gabapentin on hold - Phenobarbital and carbamazepine level pending -If patient's cognition does not continue to improve will consult neurology   Hx of Cirrhosis - Patient on rifaximin and lactulose at home, these were [place] in setting of normal-appearing liver on CT by PCCM - Patient with low albumin, normal bili, low platelets mildly elevated INR would continue to monitor closely -Monitor ammonia level now that home medication discontinued   BPH -Flomax   Anemia - 4/27 transfused 1 unit PRBC - During hospitalization transfused 2 units PRBC, 1 unit FFP?   Sepsis - Ruled out - Blood cultures NGTD - Urine culture negative  Essential HTN - 4/29 amlodipine 5 mg daily  HLD  -Lipid panel pending  -Hold Zocor    Diabetes type 2 uncontrolled with complication - 9/67 hemoglobin A1c= 8.3 -Lipid panel pending -4/29 Lantus 10 units daily - Sensitive SSI  Thrombocytopenia: Continue to monitor, follow    DVT prophylaxis: Subcu heparin Code Status: Full Family Communication: None Disposition Plan: TBD   Consultants:  Nephrology PCCM    Procedures/Significant Events:     I have personally reviewed and interpreted all radiology studies and my findings are as above.  VENTILATOR SETTINGS:    Cultures 4/25 SARS coronavirus2 negative 4/25 blood LEFT antecubital NGTD 4/25 urine negative 4/25 MRSA by PCR negative 4/25 blood RIGHT hand NGTD    Antimicrobials: Anti-infectives (From admission, onward)   Start     Stop   05/22/18  2200  rifaximin (XIFAXAN) tablet 550 mg  Status:  Discontinued    Note to Pharmacy:  For diarrhea     05/22/18 1737   05/20/18 1800  ceFEPIme (MAXIPIME) 1 g in sodium chloride 0.9 % 100 mL IVPB  Status:  Discontinued     05/19/18 1855   05/20/18 1200  vancomycin (VANCOCIN) 1,250 mg in sodium chloride 0.9 % 250 mL IVPB  Status:  Discontinued      05/21/18 0839   05/20/18 0500  ceFEPIme (MAXIPIME) 2 g in sodium chloride 0.9 % 100 mL IVPB  Status:  Discontinued     05/21/18 0839   05/19/18 2300  ceFEPIme (MAXIPIME) 2 g in sodium chloride 0.9 % 100 mL IVPB  Status:  Discontinued     05/19/18 2120   05/19/18 2200  rifaximin (XIFAXAN) tablet 550 mg  Status:  Discontinued     05/21/18 0839   05/19/18 1404  vancomycin variable dose per unstable renal function (pharmacist dosing)  Status:  Discontinued     05/19/18 1855   05/19/18 1000  vancomycin (VANCOCIN) IVPB 1000 mg/200 mL premix  Status:  Discontinued     05/19/18 1000   05/19/18 1000  ceFEPIme (MAXIPIME) 2 g in sodium chloride 0.9 % 100 mL IVPB     05/19/18 1111   05/19/18 1000  vancomycin (VANCOCIN) 2,000 mg in sodium chloride 0.9 % 500 mL IVPB     05/19/18 1422       Devices   LINES / TUBES:  Right IJ HD cath 4/25>>>     Continuous Infusions:   Objective: Vitals:   05/22/18 2012 05/22/18 2319 05/23/18 0536 05/23/18 0818  BP: (!) 163/88 (!) 173/92 (!) 158/98 (!) 160/84  Pulse: 62 61 65 (!) 59  Resp: 13 14 16 13   Temp: 98.8 F (37.1 C) 98.3 F (36.8 C) 98.6 F (37 C) 98.4 F (36.9 C)  TempSrc: Oral Oral Oral Oral  SpO2: 100% 99% 100% 100%  Weight:   55.4 kg   Height:        Intake/Output Summary (Last 24 hours) at 05/23/2018 0849 Last data filed at 05/23/2018 0545 Gross per 24 hour  Intake 762 ml  Output 336 ml  Net 426 ml   Filed Weights   05/22/18 0500 05/22/18 1820 05/23/18 0536  Weight: 126.1 kg 126.6 kg 55.4 kg    Examination:  General: A/O x2 (does not know when, why) no acute respiratory distress Eyes: negative scleral hemorrhage, negative anisocoria, negative icterus ENT: Negative Runny nose, negative gingival bleeding, Neck:  Negative scars, masses, torticollis, lymphadenopathy, JVD, RIGHT IJ HD cath in place negative sign of infection Lungs: Clear to auscultation bilaterally without wheezes or crackles Cardiovascular: Regular rate and  rhythm without murmur gallop or rub normal S1 and S2 Abdomen: negative abdominal pain, nondistended, positive soft, bowel sounds, no rebound, no ascites, no appreciable mass Extremities: No significant cyanosis, clubbing, or edema bilateral lower extremities Skin: Negative rashes, lesions, ulcers Psychiatric: Unable to fully evaluate secondary to altered mental status  Central nervous system:  Cranial nerves II through XII intact, tongue/uvula midline, all extremities muscle strength 5/5, sensation intact throughout, expressive aphasia, negative receptive aphasia  .     Data Reviewed: Care during the described time interval was provided by me .  I have reviewed this patient's available data, including medical history, events of note, physical examination, and all test results as part of my evaluation.   CBC: Recent Labs  Lab 05/19/18 0936 05/19/18 1309  05/20/18 0419 05/21/18 0419 05/22/18 0522 05/22/18 1230  WBC 4.6  --  3.4* 3.2* 3.8*  --   NEUTROABS 3.1  --   --  2.2  --   --   HGB 7.2* 6.8* 7.1* 7.1* 6.3* 7.1*  HCT 23.8* 20.0* 22.6* 22.1* 19.7* 21.4*  MCV 98.8  --  93.8 92.9 92.9  --   PLT 64*  --  55* 53* 60*  --    Basic Metabolic Panel: Recent Labs  Lab 05/20/18 0419 05/20/18 1521 05/21/18 0419 05/22/18 0522 05/22/18 1940 05/23/18 0613  NA 142 138 139 136 135 135  K 4.0 3.7 3.8 3.8 4.1 3.8  CL 112* 110 108 107 107 107  CO2 17* 19* 23 19* 19* 18*  GLUCOSE 117* 152* 95 140* 276* 174*  BUN 51* 35* 25* 27* 35* 39*  CREATININE 6.65* 4.77* 3.67* 4.28* 5.09* 5.47*  CALCIUM 7.1* 6.9* 7.0* 7.0* 7.2* 7.4*  MG 2.8*  --  2.5* 2.5*  --  2.6*  PHOS 10.1* 6.1* 4.7* 5.0* 5.6* 5.9*   GFR: Estimated Creatinine Clearance: 13.1 mL/min (A) (by C-G formula based on SCr of 5.47 mg/dL (H)). Liver Function Tests: Recent Labs  Lab 05/19/18 0936  05/20/18 1521 05/21/18 0419 05/22/18 0522 05/22/18 1940 05/23/18 0613  AST 12*  --   --  13*  --   --   --   ALT 16  --   --  15  --    --   --   ALKPHOS 140*  --   --  123  --   --   --   BILITOT 0.5  --   --  0.5  --   --   --   PROT 6.2*  --   --  5.6*  --   --   --   ALBUMIN 2.8*   < > 2.4* 2.4* 2.4* 2.5* 2.5*   < > = values in this interval not displayed.   Recent Labs  Lab 05/20/18 0419  LIPASE 20   Recent Labs  Lab 05/19/18 0943 05/21/18 0415  AMMONIA 48* 37*   Coagulation Profile: Recent Labs  Lab 05/19/18 1339 05/21/18 0419  INR 1.6* 1.7*   Cardiac Enzymes: No results for input(s): CKTOTAL, CKMB, CKMBINDEX, TROPONINI in the last 168 hours. BNP (last 3 results) No results for input(s): PROBNP in the last 8760 hours. HbA1C: No results for input(s): HGBA1C in the last 72 hours. CBG: Recent Labs  Lab 05/22/18 0730 05/22/18 1204 05/22/18 1539 05/22/18 2114 05/23/18 0815  GLUCAP 134* 218* 196* 276* 175*   Lipid Profile: No results for input(s): CHOL, HDL, LDLCALC, TRIG, CHOLHDL, LDLDIRECT in the last 72 hours. Thyroid Function Tests: No results for input(s): TSH, T4TOTAL, FREET4, T3FREE, THYROIDAB in the last 72 hours. Anemia Panel: No results for input(s): VITAMINB12, FOLATE, FERRITIN, TIBC, IRON, RETICCTPCT in the last 72 hours. Urine analysis:    Component Value Date/Time   COLORURINE AMBER (A) 05/19/2018 0941   APPEARANCEUR HAZY (A) 05/19/2018 0941   LABSPEC 1.014 05/19/2018 0941   PHURINE 5.0 05/19/2018 0941   GLUCOSEU 50 (A) 05/19/2018 0941   HGBUR SMALL (A) 05/19/2018 0941   BILIRUBINUR NEGATIVE 05/19/2018 0941   KETONESUR NEGATIVE 05/19/2018 0941   PROTEINUR 30 (A) 05/19/2018 0941   NITRITE NEGATIVE 05/19/2018 0941   LEUKOCYTESUR MODERATE (A) 05/19/2018 0941   Sepsis Labs: @LABRCNTIP (procalcitonin:4,lacticidven:4)  ) Recent Results (from the past 240 hour(s))  Blood Culture (routine x 2)  Status: None (Preliminary result)   Collection Time: 05/19/18  9:34 AM  Result Value Ref Range Status   Specimen Description BLOOD LEFT ANTECUBITAL  Final   Special Requests    Final    BOTTLES DRAWN AEROBIC AND ANAEROBIC Blood Culture adequate volume   Culture   Final    NO GROWTH 3 DAYS Performed at [facility], [address], [place], [place] [address]    Report Status PENDING  Incomplete  Urine culture     Status: None   Collection Time: 05/19/18  9:41 AM  Result Value Ref Range Status   Specimen Description URINE, RANDOM  Final   Special Requests NONE  Final   Culture   Final    NO GROWTH Performed at [facility], [address], [place], [place] [address]    Report Status 05/20/2018 FINAL  Final  SARS Coronavirus 2 [facility] order, Performed in [place] hospital lab)     Status: None   Collection Time: 05/19/18  9:44 AM  Result Value Ref Range Status   SARS Coronavirus 2 NEGATIVE NEGATIVE Final    Comment: (NOTE) If result is NEGATIVE SARS-CoV-2 target nucleic acids are NOT DETECTED. The SARS-CoV-2 RNA is generally detectable in upper and lower  respiratory specimens during the acute phase of infection. The lowest  concentration of SARS-CoV-2 viral copies this assay can detect is 250  copies / mL. A negative result does not preclude SARS-CoV-2 infection  and should not be used as the sole basis for treatment or other  patient management decisions.  A negative result may occur with  improper specimen collection / handling, submission of specimen other  than nasopharyngeal swab, presence of viral mutation(s) within the  areas targeted by this assay, and inadequate number of viral copies  (<250 copies / mL). A negative result must be combined with clinical  observations, patient history, and epidemiological information. If result is POSITIVE SARS-CoV-2 target nucleic acids are DETECTED. The SARS-CoV-2 RNA is generally detectable in upper and lower  respiratory specimens dur ing the acute phase of infection.  Positive  results are indicative of active infection with SARS-CoV-2.  Clinical  correlation with patient history and  other diagnostic information is  necessary to determine patient infection status.  Positive results do  not rule out bacterial infection or co-infection with other viruses. If result is PRESUMPTIVE POSTIVE SARS-CoV-2 nucleic acids MAY BE PRESENT.   A presumptive positive result was obtained on the submitted specimen  and confirmed on repeat testing.  While 2019 novel coronavirus  (SARS-CoV-2) nucleic acids may be present in the submitted sample  additional confirmatory testing may be necessary for epidemiological  and / or clinical management purposes  to differentiate between  SARS-CoV-2 and other Sarbecovirus currently known to infect humans.  If clinically indicated additional testing with an alternate test  methodology [phone]) is advised. The SARS-CoV-2 RNA is generally  detectable in upper and lower respiratory sp ecimens during the acute  phase of infection. The expected result is Negative. Fact Sheet for Patients:  StrictlyIdeas.no Fact Sheet for Healthcare Providers: BankingDealers.co.za This test is not yet approved or cleared by the [place] FDA and has been authorized for detection and/or diagnosis of SARS-CoV-2 by FDA under an Emergency Use Authorization (EUA).  This EUA will remain in effect (meaning this test can be used) for the duration of the COVID-19 declaration under Section 564(b)(1) of the Act, 21 U.S.C. section 360bbb-3(b)(1), unless the authorization is terminated  or revoked sooner. Performed at [facility], [place] [address], [place], [place] [address]   MRSA PCR Screening     Status: None   Collection Time: 05/19/18 10:15 AM  Result Value Ref Range Status   MRSA by PCR NEGATIVE NEGATIVE Final    Comment:        The GeneXpert MRSA Assay (FDA approved for NASAL specimens only), is one component of a comprehensive MRSA colonization surveillance program. It is not intended to diagnose MRSA infection  nor to guide or monitor treatment for MRSA infections. Performed at [facility], [place] [address], [place], [place] [address]   Blood Culture (routine x 2)     Status: None (Preliminary result)   Collection Time: 05/19/18  4:27 PM  Result Value Ref Range Status   Specimen Description BLOOD RIGHT HAND  Final   Special Requests AEROBIC BOTTLE ONLY Blood Culture adequate volume  Final   Culture   Final    NO GROWTH 3 DAYS Performed at [facility], [place] [address], [place], [place] [address]    Report Status PENDING  Incomplete         Radiology Studies: No results found.      Scheduled Meds: . carbamazepine  700 mg Oral BID  . ferrous sulfate  325 mg Oral Q breakfast  . insulin aspart  0-5 Units Subcutaneous QHS  . insulin aspart  0-9 Units Subcutaneous TID WC  . mouth rinse  15 mL Mouth Rinse BID  . PHENobarbital  243 mg Oral QHS  . simvastatin  20 mg Oral QHS  . sodium chloride flush  10 mL Intravenous Q12H  . tamsulosin  0.4 mg Oral QHS   Continuous Infusions:   LOS: 4 days   The patient is critically ill with multiple organ systems failure and requires high complexity decision making for assessment and support, frequent evaluation and titration of therapies, application of advanced monitoring technologies and extensive interpretation of multiple databases. Critical Care Time devoted to patient care services described in this note  Time spent: 40 minutes     Kyler Lerette, Geraldo Docker, MD [facility] Pager [phone]  If 7PM-7AM, please contact night-coverage [email or web address] Password [facility] 05/23/2018, 8:49 AM

## 2018-05-23 NOTE — Progress Notes (Signed)
Pharmacy note: heparin  48 yo male with acute on CKD and metabolic encephalopathy. He was recently on CRRT. Pharmacy consulted to dose heparin for VTE prophylaxis.  -hg= 7.1 (has been 6.3-7.1 since 4/25) -wt= 55.4kg -INR= 1.7 on 4/27  Plan -Heparin 5000 units sq q8h -CBC has been ordered per MD -Will sign off. Please contact pharmacy with any other needs.  Thank you Hildred Laser, PharmD Clinical Pharmacist **Pharmacist phone directory can now be found on [email or web address] ([email or web address]).  Listed under [place].

## 2018-05-24 ENCOUNTER — Encounter (HOSPITAL_COMMUNITY): Payer: Self-pay | Admitting: General Practice

## 2018-05-24 DIAGNOSIS — I10 Essential (primary) hypertension: Secondary | ICD-10-CM | POA: Diagnosis present

## 2018-05-24 DIAGNOSIS — D696 Thrombocytopenia, unspecified: Secondary | ICD-10-CM

## 2018-05-24 DIAGNOSIS — R569 Unspecified convulsions: Secondary | ICD-10-CM

## 2018-05-24 DIAGNOSIS — A419 Sepsis, unspecified organism: Secondary | ICD-10-CM

## 2018-05-24 DIAGNOSIS — E1165 Type 2 diabetes mellitus with hyperglycemia: Secondary | ICD-10-CM | POA: Diagnosis present

## 2018-05-24 DIAGNOSIS — R400 Somnolence: Secondary | ICD-10-CM

## 2018-05-24 DIAGNOSIS — E785 Hyperlipidemia, unspecified: Secondary | ICD-10-CM | POA: Diagnosis present

## 2018-05-24 DIAGNOSIS — G9341 Metabolic encephalopathy: Secondary | ICD-10-CM | POA: Diagnosis present

## 2018-05-24 DIAGNOSIS — E7800 Pure hypercholesterolemia, unspecified: Secondary | ICD-10-CM

## 2018-05-24 DIAGNOSIS — E118 Type 2 diabetes mellitus with unspecified complications: Secondary | ICD-10-CM

## 2018-05-24 DIAGNOSIS — N400 Enlarged prostate without lower urinary tract symptoms: Secondary | ICD-10-CM | POA: Diagnosis present

## 2018-05-24 HISTORY — DX: Sepsis, unspecified organism: A41.9

## 2018-05-24 HISTORY — DX: Unspecified convulsions: R56.9

## 2018-05-24 HISTORY — DX: Thrombocytopenia, unspecified: D69.6

## 2018-05-24 HISTORY — DX: Essential (primary) hypertension: I10

## 2018-05-24 HISTORY — DX: Benign prostatic hyperplasia without lower urinary tract symptoms: N40.0

## 2018-05-24 LAB — RENAL FUNCTION PANEL
Albumin: 2.5 g/dL — ABNORMAL LOW (ref 3.5–5.0)
Albumin: 2.4 g/dL — ABNORMAL LOW (ref 3.5–5.0)
Anion gap: 9 (ref 5–15)
Anion gap: 11 (ref 5–15)
BUN: 45 mg/dL — ABNORMAL HIGH (ref 6–20)
BUN: 17 mg/dL (ref 6–20)
CO2: 17 mmol/L — ABNORMAL LOW (ref 22–32)
CO2: 24 mmol/L (ref 22–32)
Calcium: 7.6 mg/dL — ABNORMAL LOW (ref 8.9–10.3)
Calcium: 7.8 mg/dL — ABNORMAL LOW (ref 8.9–10.3)
Chloride: 109 mmol/L (ref 98–111)
Chloride: 102 mmol/L (ref 98–111)
Creatinine, Ser: 6.17 mg/dL — ABNORMAL HIGH (ref 0.61–1.24)
Creatinine, Ser: 3.01 mg/dL — ABNORMAL HIGH (ref 0.61–1.24)
GFR calc Af Amer: 11 mL/min — ABNORMAL LOW (ref >60)
GFR calc Af Amer: 27 mL/min — ABNORMAL LOW (ref >60)
GFR calc non Af Amer: 10 mL/min — ABNORMAL LOW (ref >60)
GFR calc non Af Amer: 24 mL/min — ABNORMAL LOW (ref >60)
Glucose, Bld: 198 mg/dL — ABNORMAL HIGH (ref 70–99)
Glucose, Bld: 155 mg/dL — ABNORMAL HIGH (ref 70–99)
Phosphorus: 7.8 mg/dL — ABNORMAL HIGH (ref 2.5–4.6)
Phosphorus: 3.5 mg/dL (ref 2.5–4.6)
Potassium: 4 mmol/L (ref 3.5–5.1)
Potassium: 2.8 mmol/L — ABNORMAL LOW (ref 3.5–5.1)
Sodium: 135 mmol/L (ref 135–145)
Sodium: 137 mmol/L (ref 135–145)

## 2018-05-24 LAB — CBC
HCT: 21.1 % — ABNORMAL LOW (ref 39.0–52.0)
Hemoglobin: 6.7 g/dL — CL (ref 13.0–17.0)
MCH: 29.9 pg (ref 26.0–34.0)
MCHC: 31.8 g/dL (ref 30.0–36.0)
MCV: 94.2 fL (ref 80.0–100.0)
Platelets: 54 10*3/uL — ABNORMAL LOW (ref 150–400)
RBC: 2.24 MIL/uL — ABNORMAL LOW (ref 4.22–5.81)
RDW: 16 % — ABNORMAL HIGH (ref 11.5–15.5)
WBC: 4 10*3/uL (ref 4.0–10.5)
nRBC: 0 % (ref 0.0–0.2)

## 2018-05-24 LAB — IRON AND TIBC
Iron: 76 ug/dL (ref 45–182)
Saturation Ratios: 36 % (ref 17.9–39.5)
TIBC: 211 ug/dL — ABNORMAL LOW (ref 250–450)
UIBC: 135 ug/dL

## 2018-05-24 LAB — CULTURE, BLOOD (ROUTINE X 2)
Culture: NO GROWTH
Culture: NO GROWTH
Special Requests: ADEQUATE
Special Requests: ADEQUATE

## 2018-05-24 LAB — PREPARE RBC (CROSSMATCH)

## 2018-05-24 LAB — GLUCOSE, CAPILLARY
Glucose-Capillary: 141 mg/dL — ABNORMAL HIGH (ref 70–99)
Glucose-Capillary: 179 mg/dL — ABNORMAL HIGH (ref 70–99)
Glucose-Capillary: 188 mg/dL — ABNORMAL HIGH (ref 70–99)
Glucose-Capillary: 242 mg/dL — ABNORMAL HIGH (ref 70–99)
Glucose-Capillary: 247 mg/dL — ABNORMAL HIGH (ref 70–99)

## 2018-05-24 LAB — LIPID PANEL
Cholesterol: 76 mg/dL (ref 0–200)
HDL: 35 mg/dL — ABNORMAL LOW (ref >40)
LDL Cholesterol: 16 mg/dL (ref 0–99)
Total CHOL/HDL Ratio: 2.2 RATIO
Triglycerides: 123 mg/dL (ref <150)
VLDL: 25 mg/dL (ref 0–40)

## 2018-05-24 LAB — AMMONIA: Ammonia: 33 umol/L (ref 9–35)

## 2018-05-24 LAB — HEPATITIS B SURFACE ANTIGEN: Hepatitis B Surface Ag: NEGATIVE

## 2018-05-24 LAB — FERRITIN: Ferritin: 174 ng/mL (ref 24–336)

## 2018-05-24 LAB — MAGNESIUM: Magnesium: 2.7 mg/dL — ABNORMAL HIGH (ref 1.7–2.4)

## 2018-05-24 MED ORDER — SODIUM CHLORIDE 0.9% IV SOLUTION: Freq: Once | INTRAVENOUS | Status: DC

## 2018-05-24 MED ORDER — INSULIN GLARGINE 100 UNIT/ML ~~LOC~~ SOLN
15.0000 [IU] | Freq: Every day | SUBCUTANEOUS | Status: DC
  Administered 2018-05-24 – 2018-05-31 (×7): 15 [IU] via SUBCUTANEOUS
  Filled 2018-05-24 (×8): qty 0.15

## 2018-05-24 MED ORDER — HEPARIN SODIUM (PORCINE) 1000 UNIT/ML IJ SOLN
INTRAMUSCULAR | Status: AC
  Administered 2018-05-24: 2400 [IU] via INTRAVENOUS_CENTRAL
  Filled 2018-05-24: qty 3

## 2018-05-24 MED ORDER — CHLORHEXIDINE GLUCONATE CLOTH 2 % EX PADS
6.0000 | MEDICATED_PAD | Freq: Every day | CUTANEOUS | Status: DC
  Administered 2018-05-24 – 2018-05-27 (×4): 6 via TOPICAL

## 2018-05-24 NOTE — Progress Notes (Signed)
PROGRESS NOTE    Eddie Davies  SWF:[id number] DOB: Dec 24, 1970 DOA: 05/19/2018 PCP: [facility]   Brief Narrative:  48 y.o. WM PMHx Seizures, CKD stage 3, presumed liver cirrhosis, HTN, hypertension,   Presenting with AMS. He was hospitalized from 3/8-21 and 3/28-4/11 for acute metabolic encephalopathy secondary to septic shock from ESBL positive Klebsiella and E faecalis UTI with subsequent hypoxic respiratory failure in the setting of ESBL PNA, treated with meropenem and requiring mechanical ventilation and CRRT during hospitalizations.    ED Course:   Encephalopathy, UTI, AKI.  Complex patient, recently discharged.  Worsening confusion, elevated creatinine and NH4 levels.  Hypotensive on arrival, rectal temp 88, transiently hypoxic. Labs with UTI.  Creatinine up to 8.11, BUN 64.  Hgb is low but stable.  CXR with vascular congestion, mild.  Pharmacy suggested Cefepime and Vanc was reasonable.  MRSA PCR is pending.  Given mild IVF.  BP 91/51.  Has not given sepsis bolus.  PCCM to evaluate to determine if admission to their service is needed and she will let me know.   Patient was admitted to PCCM with acute renal failure and metabolic acidosis.  Received CRRT in ICU by renal.  Renal function and acidosis improved.  Transferred to [facility] 4/28.    Subjective: 4/30 A/O x4, significant improvement in cognition.  Negative CP, negative abdominal pain negative S OB   Assessment & Plan:   Active Problems:   Altered mental status, unspecified   Metabolic acidosis   Confusion   Acute metabolic encephalopathy   Seizures (HCC)   BPH (benign prostatic hyperplasia)   Sepsis (HCC)   Benign essential HTN   HLD (hyperlipidemia)   Diabetes mellitus type 2, uncontrolled, with complications (HCC)   Thrombocytopenia (HCC)  Acute on CKD stage III (baseline Cr ~1.8) -Cr peak= 8.34, now rising again  -Per nephrology most likely secondary to ATN - Continue HD per nephrology.   -  S/p CRRT.  Nephrology plans to follow with daily HD -Strict in and out +1.2 L -Daily weight Filed Weights   05/23/18 0536 05/24/18 0700 05/24/18 1133  Weight: 55.4 kg 55.9 kg 120.8 kg   Acute metabolic encephalopathy -Resolved  Hx seizures -Carbamazepine 700 mg twice daily -Carbamazepine level pending - Phenobarbital 243 mg nightly -Phenobarbital level= 11 -Gabapentin on hold   Hx of Cirrhosis - Patient on rifaximin and lactulose at home, these were DC'd in setting of normal-appearing liver on CT by PCCM - Patient with low albumin, normal bili, low platelets mildly elevated INR would continue to monitor closely -Monitor ammonia level now that home medication discontinued Recent Labs    05/24/18 0523  AMMONIA 33     BPH -Flomax   Anemia - 4/27 transfused 1 unit PRBC - During hospitalization transfused 2 units PRBC, 1 unit FFP?  -Anemia panel pending - 4/30 transfused 2 units PRBC   Sepsis - Ruled out - Blood cultures NGTD - Urine culture negative  Essential HTN - 4/29 amlodipine 5 mg daily -Control with HD  HLD  -LDL within ADA guidelines -Hold Zocor   Diabetes type 2 uncontrolled with complication - 2/20 hemoglobin A1c= 8.3 -4/30 increase Lantus 15 units daily - Sensitive SSI  Thrombocytopenia: -Low but stable    DVT prophylaxis: Subcu heparin Code Status: Full Family Communication: None Disposition Plan: TBD   Consultants:  Nephrology PCCM    Procedures/Significant Events:  - 4/27 transfused 1 unit PRBC - During hospitalization transfused 2 units PRBC, 1 unit FFP? -  4/30 transfused 2 units PRBC   I have personally reviewed and interpreted all radiology studies and my findings are as above.  VENTILATOR SETTINGS:    Cultures 4/25 SARS coronavirus2 negative 4/25 blood LEFT antecubital NGTD 4/25 urine negative 4/25 MRSA by PCR negative 4/25 blood RIGHT hand NGTD    Antimicrobials: Anti-infectives (From admission, onward)    Start     Stop   05/22/18 2200  rifaximin (XIFAXAN) tablet 550 mg  Status:  Discontinued    Note to Pharmacy:  For diarrhea     05/22/18 1737   05/20/18 1800  ceFEPIme (MAXIPIME) 1 g in sodium chloride 0.9 % 100 mL IVPB  Status:  Discontinued     05/19/18 1855   05/20/18 1200  vancomycin (VANCOCIN) 1,250 mg in sodium chloride 0.9 % 250 mL IVPB  Status:  Discontinued     05/21/18 0839   05/20/18 0500  ceFEPIme (MAXIPIME) 2 g in sodium chloride 0.9 % 100 mL IVPB  Status:  Discontinued     05/21/18 0839   05/19/18 2300  ceFEPIme (MAXIPIME) 2 g in sodium chloride 0.9 % 100 mL IVPB  Status:  Discontinued     05/19/18 2120   05/19/18 2200  rifaximin (XIFAXAN) tablet 550 mg  Status:  Discontinued     05/21/18 0839   05/19/18 1404  vancomycin variable dose per unstable renal function (pharmacist dosing)  Status:  Discontinued     05/19/18 1855   05/19/18 1000  vancomycin (VANCOCIN) IVPB 1000 mg/200 mL premix  Status:  Discontinued     05/19/18 1000   05/19/18 1000  ceFEPIme (MAXIPIME) 2 g in sodium chloride 0.9 % 100 mL IVPB     05/19/18 1111   05/19/18 1000  vancomycin (VANCOCIN) 2,000 mg in sodium chloride 0.9 % 500 mL IVPB     05/19/18 1422       Devices   LINES / TUBES:  Right IJ HD cath 4/25>>>     Continuous Infusions:   Objective: Vitals:   05/24/18 1530 05/24/18 1535 05/24/18 1550 05/24/18 1600  BP: (!) 148/87 (!) 148/87 122/86 (!) 163/89  Pulse: 61 61 60 (!) 58  Resp: 14 12 14 13   Temp: 98.1 F (36.7 C) 98.4 F (36.9 C) 98.4 F (36.9 C) 98.4 F (36.9 C)  TempSrc: Oral Oral Oral Oral  SpO2:      Weight:      Height:        Intake/Output Summary (Last 24 hours) at 05/24/2018 1756 Last data filed at 05/24/2018 1600 Gross per 24 hour  Intake 852 ml  Output 2515 ml  Net -1663 ml   Filed Weights   05/23/18 0536 05/24/18 0700 05/24/18 1133  Weight: 55.4 kg 55.9 kg 120.8 kg   General: A/O x4, no acute respiratory distress Eyes: negative scleral hemorrhage,  negative anisocoria, negative icterus ENT: Negative Runny nose, negative gingival bleeding, Neck:  Negative scars, masses, torticollis, lymphadenopathy, JVD Lungs: Clear to auscultation bilaterally without wheezes or crackles Cardiovascular: Regular rate and rhythm without murmur gallop or rub normal S1 and S2 Abdomen: Morbidly obese, negative abdominal pain, nondistended, positive soft, bowel sounds, no rebound, no ascites, no appreciable mass Extremities: No significant cyanosis, clubbing, 2+ pitting edema bilateral  Skin: Negative rashes, lesions, ulcers Psychiatric:  Negative depression, negative anxiety, negative fatigue, negative mania  Central nervous system:  Cranial nerves II through XII intact, tongue/uvula midline, all extremities muscle strength 5/5, sensation intact throughout,  negative dysarthria, negative expressive aphasia, negative receptive  aphasia.  .     Data Reviewed: Care during the described time interval was provided by me .  I have reviewed this patient's available data, including medical history, events of note, physical examination, and all test results as part of my evaluation.   CBC: Recent Labs  Lab 05/19/18 0936  05/20/18 0419 05/21/18 0419 05/22/18 0522 05/22/18 1230 05/24/18 0527  WBC 4.6  --  3.4* 3.2* 3.8*  --  4.0  NEUTROABS 3.1  --   --  2.2  --   --   --   HGB 7.2*   < > 7.1* 7.1* 6.3* 7.1* 6.7*  HCT 23.8*   < > 22.6* 22.1* 19.7* 21.4* 21.1*  MCV 98.8  --  93.8 92.9 92.9  --  94.2  PLT 64*  --  55* 53* 60*  --  54*   < > = values in this interval not displayed.   Basic Metabolic Panel: Recent Labs  Lab 05/20/18 0419  05/21/18 0419 05/22/18 0522 05/22/18 1940 05/23/18 0613 05/23/18 1516 05/24/18 0527  NA 142   < > 139 136 135 135 135 135  K 4.0   < > 3.8 3.8 4.1 3.8 3.8 4.0  CL 112*   < > 108 107 107 107 109 109  CO2 17*   < > 23 19* 19* 18* 16* 17*  GLUCOSE 117*   < > 95 140* 276* 174* 283* 198*  BUN 51*   < > 25* 27* 35* 39* 42*  45*  CREATININE 6.65*   < > 3.67* 4.28* 5.09* 5.47* 5.75* 6.17*  CALCIUM 7.1*   < > 7.0* 7.0* 7.2* 7.4* 7.3* 7.6*  MG 2.8*  --  2.5* 2.5*  --  2.6*  --  2.7*  PHOS 10.1*   < > 4.7* 5.0* 5.6* 5.9* 6.5* 7.8*   < > = values in this interval not displayed.   GFR: Estimated Creatinine Clearance: 20.4 mL/min (A) (by C-G formula based on SCr of 6.17 mg/dL (H)). Liver Function Tests: Recent Labs  Lab 05/19/18 0936  05/21/18 0419 05/22/18 0522 05/22/18 1940 05/23/18 0613 05/23/18 1516 05/24/18 0527  AST 12*  --  13*  --   --   --   --   --   ALT 16  --  15  --   --   --   --   --   ALKPHOS 140*  --  123  --   --   --   --   --   BILITOT 0.5  --  0.5  --   --   --   --   --   PROT 6.2*  --  5.6*  --   --   --   --   --   ALBUMIN 2.8*   < > 2.4* 2.4* 2.5* 2.5* 2.4* 2.5*   < > = values in this interval not displayed.   Recent Labs  Lab 05/20/18 0419  LIPASE 20   Recent Labs  Lab 05/19/18 0943 05/21/18 0415 05/24/18 0523  AMMONIA 48* 37* 33   Coagulation Profile: Recent Labs  Lab 05/19/18 1339 05/21/18 0419  INR 1.6* 1.7*   Cardiac Enzymes: No results for input(s): CKTOTAL, CKMB, CKMBINDEX, TROPONINI in the last 168 hours. BNP (last 3 results) No results for input(s): PROBNP in the last 8760 hours. HbA1C: No results for input(s): HGBA1C in the last 72 hours. CBG: Recent Labs  Lab 05/23/18 2118 05/23/18 2328 05/24/18 0518 05/24/18  0802 05/24/18 1700  GLUCAP 255* 260* 179* 188* 141*   Lipid Profile: Recent Labs    05/24/18 0528  CHOL 76  HDL 35*  LDLCALC 16  TRIG 123  CHOLHDL 2.2   Thyroid Function Tests: No results for input(s): TSH, T4TOTAL, FREET4, T3FREE, THYROIDAB in the last 72 hours. Anemia Panel: Recent Labs    05/24/18 1201  FERRITIN 174  TIBC 211*  IRON 76   Urine analysis:    Component Value Date/Time   COLORURINE AMBER (A) 05/19/2018 0941   APPEARANCEUR HAZY (A) 05/19/2018 0941   LABSPEC 1.014 05/19/2018 0941   PHURINE 5.0  05/19/2018 0941   GLUCOSEU 50 (A) 05/19/2018 0941   HGBUR SMALL (A) 05/19/2018 0941   BILIRUBINUR NEGATIVE 05/19/2018 0941   KETONESUR NEGATIVE 05/19/2018 0941   PROTEINUR 30 (A) 05/19/2018 0941   NITRITE NEGATIVE 05/19/2018 0941   LEUKOCYTESUR MODERATE (A) 05/19/2018 0941   Sepsis Labs: @LABRCNTIP (procalcitonin:4,lacticidven:4)  ) Recent Results (from the past 240 hour(s))  Blood Culture (routine x 2)     Status: None   Collection Time: 05/19/18  9:34 AM  Result Value Ref Range Status   Specimen Description BLOOD LEFT ANTECUBITAL  Final   Special Requests   Final    BOTTLES DRAWN AEROBIC AND ANAEROBIC Blood Culture adequate volume   Culture   Final    NO GROWTH 5 DAYS Performed at [facility], [place] [address], [place], [place] [address]    Report Status 05/24/2018 FINAL  Final  Urine culture     Status: None   Collection Time: 05/19/18  9:41 AM  Result Value Ref Range Status   Specimen Description URINE, RANDOM  Final   Special Requests NONE  Final   Culture   Final    NO GROWTH Performed at [facility], [place] [address] [place], [place] [address]    Report Status 05/20/2018 FINAL  Final  SARS Coronavirus 2 [facility] order, Performed in [place] hospital lab)     Status: None   Collection Time: 05/19/18  9:44 AM  Result Value Ref Range Status   SARS Coronavirus 2 NEGATIVE NEGATIVE Final    Comment: (NOTE) If result is NEGATIVE SARS-CoV-2 target nucleic acids are NOT DETECTED. The SARS-CoV-2 RNA is generally detectable in upper and lower  respiratory specimens during the acute phase of infection. The lowest  concentration of SARS-CoV-2 viral copies this assay can detect is 250  copies / mL. A negative result does not preclude SARS-CoV-2 infection  and should not be used as the sole basis for treatment or other  patient management decisions.  A negative result may occur with  improper specimen collection / handling, submission of specimen other  than  nasopharyngeal swab, presence of viral mutation(s) within the  areas targeted by this assay, and inadequate number of viral copies  (<250 copies / mL). A negative result must be combined with clinical  observations, patient history, and epidemiological information. If result is POSITIVE SARS-CoV-2 target nucleic acids are DETECTED. The SARS-CoV-2 RNA is generally detectable in upper and lower  respiratory specimens dur ing the acute phase of infection.  Positive  results are indicative of active infection with SARS-CoV-2.  Clinical  correlation with patient history and other diagnostic information is  necessary to determine patient infection status.  Positive results do  not rule out bacterial infection or co-infection with other viruses. If result is PRESUMPTIVE POSTIVE SARS-CoV-2 nucleic acids MAY BE PRESENT.   A presumptive positive  result was obtained on the submitted specimen  and confirmed on repeat testing.  While 2019 novel coronavirus  (SARS-CoV-2) nucleic acids may be present in the submitted sample  additional confirmatory testing may be necessary for epidemiological  and / or clinical management purposes  to differentiate between  SARS-CoV-2 and other Sarbecovirus currently known to infect humans.  If clinically indicated additional testing with an alternate test  methodology [phone]) is advised. The SARS-CoV-2 RNA is generally  detectable in upper and lower respiratory sp ecimens during the acute  phase of infection. The expected result is Negative. Fact Sheet for Patients:  StrictlyIdeas.no Fact Sheet for Healthcare Providers: BankingDealers.co.za This test is not yet approved or cleared by the [place] FDA and has been authorized for detection and/or diagnosis of SARS-CoV-2 by FDA under an Emergency Use Authorization (EUA).  This EUA will remain in effect (meaning this test can be used) for the duration of the  COVID-19 declaration under Section 564(b)(1) of the Act, 21 U.S.C. section 360bbb-3(b)(1), unless the authorization is terminated or revoked sooner. Performed at [facility], [place] [address] [place], [place] [address]   MRSA PCR Screening     Status: None   Collection Time: 05/19/18 10:15 AM  Result Value Ref Range Status   MRSA by PCR NEGATIVE NEGATIVE Final    Comment:        The GeneXpert MRSA Assay (FDA approved for NASAL specimens only), is one component of a comprehensive MRSA colonization surveillance program. It is not intended to diagnose MRSA infection nor to guide or monitor treatment for MRSA infections. Performed at [facility], [place] [address], [place], [place] [address]   Blood Culture (routine x 2)     Status: None   Collection Time: 05/19/18  4:27 PM  Result Value Ref Range Status   Specimen Description BLOOD RIGHT HAND  Final   Special Requests AEROBIC BOTTLE ONLY Blood Culture adequate volume  Final   Culture   Final    NO GROWTH 5 DAYS Performed at [facility], [place] [address] [place],  [address]    Report Status 05/24/2018 FINAL  Final         Radiology Studies: No results found.      Scheduled Meds: . sodium chloride   Intravenous Once  . amLODipine  5 mg Oral Daily  . carbamazepine  700 mg Oral BID  . Chlorhexidine Gluconate Cloth  6 each Topical Q0600  . ferrous sulfate  325 mg Oral Q breakfast  . heparin injection (subcutaneous)  5,000 Units Subcutaneous Q8H  . insulin aspart  0-5 Units Subcutaneous QHS  . insulin aspart  0-9 Units Subcutaneous Q4H  . insulin glargine  15 Units Subcutaneous QHS  . mouth rinse  15 mL Mouth Rinse BID  . PHENobarbital  243 mg Oral QHS  . simvastatin  20 mg Oral QHS  . sodium chloride flush  10 mL Intravenous Q12H  . tamsulosin  0.4 mg Oral QHS   Continuous Infusions:   LOS: 5 days   The patient is critically ill with multiple organ systems failure and requires high  complexity decision making for assessment and support, frequent evaluation and titration of therapies, application of advanced monitoring technologies and extensive interpretation of multiple databases. Critical Care Time devoted to patient care services described in this note  Time spent: 40 minutes     Sibel Khurana, Geraldo Docker, MD [facility] Pager [phone]  If 7PM-7AM, please contact night-coverage  [email or web address] Password [facility] 05/24/2018, 5:56 PM

## 2018-05-24 NOTE — Progress Notes (Signed)
Admit: 05/19/2018 LOS: 5  75M Cirrhosis, CKD (Labile SCr, perhaps SCr as low as 1.9) admitted with AMS and AKI.  Recent [facility] admit for pneumonia / VDRF  Subjective:  . Serum creatinine continues to increase, 6.17 today.  Potassium okay.  Serum bicarbonate 17. . 0.5 L urine output yesterday . Patient remains awake, groggy. Marland Kitchen Has right IJ temp HD cath, no bleeding   04/29 0701 - 04/30 0700 In: 444 [P.O.:444] Out: 715 [Urine:515; Stool:200]  Filed Weights   05/22/18 1820 05/23/18 0536 05/24/18 0700  Weight: 126.6 kg 55.4 kg 55.9 kg    Scheduled Meds: . amLODipine  5 mg Oral Daily  . carbamazepine  700 mg Oral BID  . Chlorhexidine Gluconate Cloth  6 each Topical Q0600  . ferrous sulfate  325 mg Oral Q breakfast  . heparin injection (subcutaneous)  5,000 Units Subcutaneous Q8H  . insulin aspart  0-5 Units Subcutaneous QHS  . insulin aspart  0-9 Units Subcutaneous Q4H  . insulin glargine  10 Units Subcutaneous QHS  . mouth rinse  15 mL Mouth Rinse BID  . PHENobarbital  243 mg Oral QHS  . simvastatin  20 mg Oral QHS  . sodium chloride flush  10 mL Intravenous Q12H  . tamsulosin  0.4 mg Oral QHS   Continuous Infusions: PRN Meds:.heparin, ondansetron (ZOFRAN) IV, sodium chloride flush  Current Labs: reviewed    Physical Exam:  Blood pressure (!) 113/59, pulse (!) 57, temperature 98 F (36.7 C), temperature source Axillary, resp. rate 14, height 6' 2"  (1.88 m), weight 55.9 kg, SpO2 100 %. Obese, NAD RRR nl s1s2 CTAB Ichythosis present No sig LEE S/nt/nd Nonfocal Foley cath in place  A 1. Dialysis dependent AKI, labile baseline CKD; CT A/P at admit with b/l nonobstructive nephrolithiasis; probably ATN 2. Cirrhosis 3. Morbid obesity 4. Long term SNF resident low 5. B/l nonobstructive nephrolithiasis 6. Anemia 7. Hx/o seizures 8. L IJ Temp HD cath, sig oozing 9. AMS resolved 10. Metabolic acidosis, stable 11. Temp R IJ Cath present  P . Given worsening in labs and  an adequate urine output will provide hemodialysis today . We will try to transfuse 2 unites pRBC with dialysis given hemoglobin 6.7.   . Medication Issues; o Preferred narcotic agents for pain control are hydromorphone, fentanyl, and methadone. Morphine should not be used.  o Baclofen should be avoided o Avoid oral sodium phosphate and magnesium citrate based laxatives / bowel preps    Pearson Grippe MD 05/24/2018, 11:45 AM  Recent Labs  Lab 05/23/18 0613 05/23/18 1516 05/24/18 0527  NA 135 135 135  K 3.8 3.8 4.0  CL 107 109 109  CO2 18* 16* 17*  GLUCOSE 174* 283* 198*  BUN 39* 42* 45*  CREATININE 5.47* 5.75* 6.17*  CALCIUM 7.4* 7.3* 7.6*  PHOS 5.9* 6.5* 7.8*   Recent Labs  Lab 05/19/18 0936  05/21/18 0419 05/22/18 0522 05/22/18 1230 05/24/18 0527  WBC 4.6   < > 3.2* 3.8*  --  4.0  NEUTROABS 3.1  --  2.2  --   --   --   HGB 7.2*   < > 7.1* 6.3* 7.1* 6.7*  HCT 23.8*   < > 22.1* 19.7* 21.4* 21.1*  MCV 98.8   < > 92.9 92.9  --  94.2  PLT 64*   < > 53* 60*  --  54*   < > = values in this interval not displayed.

## 2018-05-24 NOTE — Progress Notes (Signed)
OT Cancellation Note  Patient Details Name: Eddie Davies MRN: [id number] DOB: [birth year]   Cancelled Treatment:    Reason Eval/Treat Not Completed: Patient at procedure or test/ unavailable. Pt is off floor for HD. Will continue to follow acutely and will check back at a later time.   Darrol Jump OTR/L [place] [phone] 05/24/2018, 2:01 PM

## 2018-05-24 NOTE — Progress Notes (Signed)
PT Cancellation Note  Patient Details Name: EFRAIM VANALLEN MRN: [id number] DOB: Nov 15, 1970   Cancelled Treatment:    Reason Eval/Treat Not Completed: (P) Patient at procedure or test/unavailable Pt is off floor for HD. PT will follow back for treatment as able.  [place] Firkus B. Migdalia Dk PT, DPT Acute Rehabilitation Services Pager [phone] Office [phone]    [place] 05/24/2018, 1:24 PM

## 2018-05-24 NOTE — Progress Notes (Signed)
Inpatient Diabetes Program Recommendations  AACE/ADA: New Consensus Statement on Inpatient Glycemic Control  Target Ranges:  Prepandial:   less than 140 mg/dL      Peak postprandial:   less than 180 mg/dL (1-2 hours)      Critically ill patients:  140 - 180 mg/dL   Results for Eddie Davies, Eddie Davies (MRN [id number]) as of 05/24/2018 12:21  Ref. Range 05/23/2018 08:15 05/23/2018 12:24 05/23/2018 17:28 05/23/2018 21:18 05/23/2018 23:28 05/24/2018 05:18 05/24/2018 08:02  Glucose-Capillary Latest Ref Range: 70 - 99 mg/dL 175 (H) 275 (H) 223 (H) 255 (H) 260 (H) 179 (H) 188 (H)   Review of Glycemic Control  Diabetes history: DM2 Outpatient Diabetes medications: Basaglar 17 units BID, Novolog 0-15 units TID with meals, Tradjenta 5 mg daily Current orders for Inpatient glycemic control: Lantus 10 units QHS, Novolog 0-9 units Q4H, Novolog 0-5 units QHS  Inpatient Diabetes Program Recommendations:   Correction (SSI): Please consider changing frequency of Novolog 0-9 units to TID with meals.  Insulin - Meal Coverage: Please consider ordering Novolog 3 units TID with meals for meal coverage if patient eats at least 50% of meals.  Thanks, Barnie Alderman, RN, MSN, CDE Diabetes Coordinator Inpatient Diabetes Program [phone] (Team Pager from 8am to 5pm)

## 2018-05-25 DIAGNOSIS — I10 Essential (primary) hypertension: Secondary | ICD-10-CM

## 2018-05-25 LAB — MAGNESIUM: Magnesium: 2.3 mg/dL (ref 1.7–2.4)

## 2018-05-25 LAB — BPAM RBC
Blood Product Expiration Date: 202005052359
Blood Product Expiration Date: 202005052359
ISSUE DATE / TIME: 202004301451
ISSUE DATE / TIME: 202004301451
Unit Type and Rh: 6200
Unit Type and Rh: 6200

## 2018-05-25 LAB — RENAL FUNCTION PANEL
Albumin: 2.4 g/dL — ABNORMAL LOW (ref 3.5–5.0)
Albumin: 2.6 g/dL — ABNORMAL LOW (ref 3.5–5.0)
Anion gap: 8 (ref 5–15)
Anion gap: 10 (ref 5–15)
BUN: 20 mg/dL (ref 6–20)
BUN: 21 mg/dL — ABNORMAL HIGH (ref 6–20)
CO2: 23 mmol/L (ref 22–32)
CO2: 21 mmol/L — ABNORMAL LOW (ref 22–32)
Calcium: 7.6 mg/dL — ABNORMAL LOW (ref 8.9–10.3)
Calcium: 7.7 mg/dL — ABNORMAL LOW (ref 8.9–10.3)
Chloride: 106 mmol/L (ref 98–111)
Chloride: 106 mmol/L (ref 98–111)
Creatinine, Ser: 3.84 mg/dL — ABNORMAL HIGH (ref 0.61–1.24)
Creatinine, Ser: 4.21 mg/dL — ABNORMAL HIGH (ref 0.61–1.24)
GFR calc Af Amer: 20 mL/min — ABNORMAL LOW (ref >60)
GFR calc Af Amer: 18 mL/min — ABNORMAL LOW (ref >60)
GFR calc non Af Amer: 18 mL/min — ABNORMAL LOW (ref >60)
GFR calc non Af Amer: 16 mL/min — ABNORMAL LOW (ref >60)
Glucose, Bld: 123 mg/dL — ABNORMAL HIGH (ref 70–99)
Glucose, Bld: 271 mg/dL — ABNORMAL HIGH (ref 70–99)
Phosphorus: 4.1 mg/dL (ref 2.5–4.6)
Phosphorus: 3.9 mg/dL (ref 2.5–4.6)
Potassium: 2.7 mmol/L — CL (ref 3.5–5.1)
Potassium: 3.1 mmol/L — ABNORMAL LOW (ref 3.5–5.1)
Sodium: 137 mmol/L (ref 135–145)
Sodium: 137 mmol/L (ref 135–145)

## 2018-05-25 LAB — CBC
HCT: 23 % — ABNORMAL LOW (ref 39.0–52.0)
Hemoglobin: 7.8 g/dL — ABNORMAL LOW (ref 13.0–17.0)
MCH: 30.4 pg (ref 26.0–34.0)
MCHC: 33.9 g/dL (ref 30.0–36.0)
MCV: 89.5 fL (ref 80.0–100.0)
Platelets: 64 10*3/uL — ABNORMAL LOW (ref 150–400)
RBC: 2.57 MIL/uL — ABNORMAL LOW (ref 4.22–5.81)
RDW: 15.7 % — ABNORMAL HIGH (ref 11.5–15.5)
WBC: 5.4 10*3/uL (ref 4.0–10.5)
nRBC: 0 % (ref 0.0–0.2)

## 2018-05-25 LAB — HEPATITIS B CORE ANTIBODY, TOTAL: Hep B Core Total Ab: NEGATIVE

## 2018-05-25 LAB — TYPE AND SCREEN
ABO/RH(D): A POS
Antibody Screen: NEGATIVE
Unit division: 0
Unit division: 0

## 2018-05-25 LAB — GLUCOSE, CAPILLARY
Glucose-Capillary: 130 mg/dL — ABNORMAL HIGH (ref 70–99)
Glucose-Capillary: 114 mg/dL — ABNORMAL HIGH (ref 70–99)
Glucose-Capillary: 155 mg/dL — ABNORMAL HIGH (ref 70–99)
Glucose-Capillary: 267 mg/dL — ABNORMAL HIGH (ref 70–99)
Glucose-Capillary: 215 mg/dL — ABNORMAL HIGH (ref 70–99)

## 2018-05-25 LAB — CARBAMAZEPINE, FREE AND TOTAL
Carbamazepine, Free: 2.3 ug/mL (ref 0.6–4.2)
Carbamazepine, Total: 5.3 ug/mL (ref 4.0–12.0)

## 2018-05-25 LAB — AMMONIA: Ammonia: 29 umol/L (ref 9–35)

## 2018-05-25 LAB — HEPATITIS B SURFACE ANTIBODY,QUALITATIVE: Hep B S Ab: NONREACTIVE

## 2018-05-25 MED ORDER — POTASSIUM CHLORIDE CRYS ER 20 MEQ PO TBCR
40.0000 meq | EXTENDED_RELEASE_TABLET | Freq: Once | ORAL | Status: AC
  Administered 2018-05-25: 40 meq via ORAL
  Filled 2018-05-25: qty 2

## 2018-05-25 NOTE — Progress Notes (Signed)
Admit: 05/19/2018 LOS: 6  1M Cirrhosis, CKD (Labile SCr, perhaps SCr as low as 1.9) admitted with AMS and AKI.  Recent [facility] admit for pneumonia / VDRF  Subjective:  . HD yesterday:  2L UF . 0.4L UOP yesterday . Given 2u PRBC wit HD . K 2.7 this AM;  . In chair, doesn't like renal diet . Has right IJ temp HD cath, no bleeding   04/30 0701 - 05/01 0700 In: 630 [Blood:630] Out: 2400 [Urine:400]  Filed Weights   05/24/18 0700 05/24/18 1133 05/25/18 0555  Weight: 55.9 kg 120.8 kg 120.5 kg    Scheduled Meds: . sodium chloride   Intravenous Once  . amLODipine  5 mg Oral Daily  . carbamazepine  700 mg Oral BID  . Chlorhexidine Gluconate Cloth  6 each Topical Q0600  . ferrous sulfate  325 mg Oral Q breakfast  . heparin injection (subcutaneous)  5,000 Units Subcutaneous Q8H  . insulin aspart  0-5 Units Subcutaneous QHS  . insulin aspart  0-9 Units Subcutaneous Q4H  . insulin glargine  15 Units Subcutaneous QHS  . mouth rinse  15 mL Mouth Rinse BID  . PHENobarbital  243 mg Oral QHS  . simvastatin  20 mg Oral QHS  . sodium chloride flush  10 mL Intravenous Q12H  . tamsulosin  0.4 mg Oral QHS   Continuous Infusions: PRN Meds:.heparin, ondansetron (ZOFRAN) IV, sodium chloride flush  Current Labs: reviewed    Physical Exam:  Blood pressure 133/70, pulse 67, temperature 98.6 F (37 C), temperature source Oral, resp. rate 14, height 6' 2"  (1.88 m), weight 120.5 kg, SpO2 100 %. Obese, NAD RRR nl s1s2 CTAB Ichythosis present No sig LEE S/nt/nd Nonfocal Foley cath in place  A 1. Dialysis dependent AKI, labile baseline CKD; CT A/P at admit with b/l nonobstructive nephrolithiasis; probably ATN 2. Cirrhosis 3. Morbid obesity 4. Long term SNF resident  5. B/l nonobstructive nephrolithiasis 6. Anemia 7. Hx/o seizures 8. AMS resolved 9. Metabolic acidosis, stable 10. Temp R IJ Cath present  P . Remains HD dep't; will try to monitor UOP and labs over wekend, might need HD  tomorrow.  If no improvement probably will need TDC and consideration of outpt AKI Clip -- will initiate process today . Repelte K today with 42mq PO . Medication Issues; o Preferred narcotic agents for pain control are hydromorphone, fentanyl, and methadone. Morphine should not be used.  o Baclofen should be avoided o Avoid oral sodium phosphate and magnesium citrate based laxatives / bowel preps    RPearson GrippeMD 05/25/2018, 9:54 AM  Recent Labs  Lab 05/24/18 0527 05/24/18 1720 05/25/18 0500  NA 135 137 137  K 4.0 2.8* 2.7*  CL 109 102 106  CO2 17* 24 23  GLUCOSE 198* 155* 123*  BUN 45* 17 20  CREATININE 6.17* 3.01* 3.84*  CALCIUM 7.6* 7.8* 7.6*  PHOS 7.8* 3.5 4.1   Recent Labs  Lab 05/19/18 0936  05/21/18 0419 05/22/18 0522 05/22/18 1230 05/24/18 0527 05/25/18 0500  WBC 4.6   < > 3.2* 3.8*  --  4.0 5.4  NEUTROABS 3.1  --  2.2  --   --   --   --   HGB 7.2*   < > 7.1* 6.3* 7.1* 6.7* 7.8*  HCT 23.8*   < > 22.1* 19.7* 21.4* 21.1* 23.0*  MCV 98.8   < > 92.9 92.9  --  94.2 89.5  PLT 64*   < > 53* 60*  --  54* 64*   < > = values in this interval not displayed.

## 2018-05-25 NOTE — Progress Notes (Signed)
PROGRESS NOTE    QUANTEL MCINTURFF  HYW:[id number] DOB: 20-Oct-1970 DOA: 05/19/2018 PCP: [facility]   Brief Narrative:  48 y.o.WM PMHx Seizures, CKD stage 3, presumed liver cirrhosis, HTN, hypertension, who presented with AMS. He was hospitalized from 3/8-21 and 3/28-4/11 for acute metabolic encephalopathy secondary to septic shock from ESBL positive Klebsiella and E faecalis UTI with subsequent hypoxic respiratory failure in the setting of ESBL PNA, treated with meropenem and requiring mechanical ventilation and CRRT during hospitalizations.  ED Course:Encephalopathy, UTI, AKI. Complex patient, recently discharged. Worsening confusion, elevated creatinine and NH4 levels. Hypotensive on arrival, rectal temp 88, transiently hypoxic. Labs with UTI. Creatinine up to 8.11, BUN 64. Hgb is low but stable. CXR with vascular congestion, mild. Pharmacy suggested Cefepime and Vanc was reasonable. MRSA PCR is pending. Given mild IVF. BP 91/51. Has not given sepsis bolus. PCCM to evaluate to determine if admission to their service is needed and she will let me know.  Patient was admitted to PCCM with acute renal failure and metabolic acidosis. Received CRRT in ICU by renal. Renal function and acidosis improved. Transferred to [facility] 4/28    Assessment & Plan:   Active Problems:   Altered mental status, unspecified   Metabolic acidosis   Confusion   Acute metabolic encephalopathy   Seizures (HCC)   BPH (benign prostatic hyperplasia)   Sepsis (HCC)   Benign essential HTN   HLD (hyperlipidemia)   Diabetes mellitus type 2, uncontrolled, with complications (HCC)   Thrombocytopenia (HCC)   Acute on CKD stage III (baseline Cr ~1.8) -Cr peak= 8.34, now 3.84 -Per nephrology most likely secondary to ATN - Continue HD per nephrology.   - S/p CRRT.  Nephrology plans to follow with daily HD -Strict in and out  +1 L - Daily weights  Acute metabolic  encephalopathy -Resolved, answering questions appropriately  Hx seizures -No evidence of seizure activity. -Carbamazepine 700 mg twice daily -Carbamazepine level pending - Phenobarbital 243 mg nightly -Phenobarbital level= 11 -Gabapentin on hold  Hx of Cirrhosis - Patient on rifaximin and lactulose at home, these were DC'd in setting of normal-appearing liver on CT by PCCM - Patient with low albumin, normal bili, low platelets mildly elevated INR would continue to monitor closely -Monitor ammonia level now that home medication discontinued, most recent 29 on May 1  BPH -Flomax -Continue to have good output with a total output 2400 last 24 hours  Anemia - 4/27 transfused 1 unit PRBC - During hospitalization transfused 2 units PRBC, 1 unit FFP?  -Anemia that will reviewed - 4/30 transfused 2 units PRBC  Sepsis - Ruled out - Blood cultures NGTD - Urine culture negative  Essential HTN - 4/29 amlodipine 5 mg daily to monitor blood pressure before titrating [id number] systolically today -Control with HD  HLD -LDL within ADA guidelines -Hold Zocor  Diabetes type 2 uncontrolled with complication - 7/03 hemoglobin A1c= 8.3 -4/30 increase Lantus 15 units daily, continue to monitor blood glucose - Sensitive SSI  Thrombocytopenia: -Low but stable no evidence of bleeding    DVT prophylaxis: Heparin SQ  Code Status: Full    Code Status Orders  (From admission, onward)         Start     Ordered   05/19/18 1446  Full code  Continuous     05/19/18 1447        Code Status History    Date Active Date Inactive Code Status Order ID Comments User Context   04/21/2018  2247 05/05/2018 1938 Full Code [id number]  Garrel Ridgel, MD Inpatient   04/07/2018 1858 04/14/2018 1642 Full Code [id number]  Erick Colace, NP Inpatient    Advance Directive Documentation     Most Recent Value  Type of Advance Directive  Healthcare Power of Attorney  Pre-existing out of  facility DNR order (yellow form or pink MOST form)  --  "MOST" Form in Place?  --     Family Communication: By phone Disposition Plan:   Patient will require continued inpatient treatment for emergent dialysis, frequent nursing care, electrolyte monitoring, and fluid management.  Patient at risk of volume overload and respiratory and cardiac arrest without these interventions Consults called: None Admission status: Inpatient   Consultants:   Nephrology  Procedures:  Ct Abdomen Pelvis Wo Contrast  Result Date: 05/19/2018 CLINICAL DATA:  Increased creatinine and ammonia levels. Altered mental status. Slurred speech. EXAM: CT ABDOMEN AND PELVIS WITHOUT CONTRAST TECHNIQUE: Multidetector CT imaging of the abdomen and pelvis was performed following the standard protocol without IV contrast. COMPARISON:  07/09/2017 FINDINGS: Lower chest: Dependent atelectasis. Mild coronary artery calcification. Hepatobiliary: Postcholecystectomy. Unremarkable appearance of the liver. Pancreas: There is fatty stranding in an about the length of the pancreas. Scattered pancreatic parenchymal calcifications are present suggesting chronic pancreatitis. No obvious mass or ductal dilatation. Spleen: Unremarkable Adrenals/Urinary Tract: Prominent perinephric stranding bilaterally is similar to that seen on the prior study. Small punctate calculi throughout the collecting system of the left kidney. 1.4 cm calculus in the right renal pelvis. Adrenal glands are within normal limits. Foley catheter decompresses the bladder. Stomach/Bowel: No disproportionate dilatation of bowel. No obvious mass in the colon. Unremarkable stomach. Vascular/Lymphatic: Minimal atherosclerotic vascular calcifications. Several subcentimeter short axis diameter para-aortic lymph nodes. Reproductive: Unremarkable prostate. Other: There is no free fluid. There is stranding in the fat along the paracolic gutters and in the presacral space. There is also  stranding in the subcutaneous fat. Musculoskeletal: No vertebral compression deformity. IMPRESSION: There is stranding within the fat in an about the pancreas suggesting an inflammatory process. Correlation with amylase and lipase is recommended. Pancreatic calcifications are compatible with chronic pancreatitis. There is stable prominent bilateral perinephric stranding. These findings may represent a recurrent inflammatory process of the kidneys. Bilateral nephrolithiasis. Stranding within the retroperitoneal fat suggesting anasarca. Electronically Signed   By: Marybelle Killings M.D.   On: 05/19/2018 15:41   Dg Chest Port 1 View  Result Date: 05/21/2018 CLINICAL DATA:  Abnormal respiration. EXAM: PORTABLE CHEST 1 VIEW COMPARISON:  Radiograph of May 19, 2018. FINDINGS: Stable cardiomegaly. Right internal jugular catheter is unchanged in position. No pneumothorax is noted. Left lung base is not included in field-of-view. No definite pleural effusion is seen within the visualized lung fields. Bony thorax is unremarkable. No consolidative process is noted. IMPRESSION: Stable cardiomegaly.  No acute pulmonary abnormality seen. Electronically Signed   By: Marijo Conception M.D.   On: 05/21/2018 07:50   Dg Chest Port 1 View  Result Date: 05/19/2018 CLINICAL DATA:  Bedside central venous catheter placement. EXAM: PORTABLE CHEST 1 VIEW 8:55 p.m.: COMPARISON:  Portable chest x-ray earlier today at 10:03 a.m. and previously. FINDINGS: RIGHT jugular central venous catheter tip projects over the UPPER SVC. No evidence of pneumothorax or mediastinal hematoma. Near expiratory image which accounts for the bibasilar atelectasis. New mild perihilar airspace pulmonary edema since the examination earlier this morning. IMPRESSION: 1. RIGHT jugular central venous catheter tip projects over the SVC. No acute complicating features. 2. New  mild perihilar airspace pulmonary edema since the examination earlier this morning. Electronically  Signed   By: Evangeline Dakin M.D.   On: 05/19/2018 21:06   Dg Chest Port 1 View  Result Date: 05/19/2018 CLINICAL DATA:  Increased creatinine and ammonia levels. Surge speech. EXAM: PORTABLE CHEST 1 VIEW COMPARISON:  May 16, 2018 FINDINGS: Stable cardiomegaly. Mild pulmonary venous congestion without overt edema. No focal infiltrate. No pneumothorax. IMPRESSION: Cardiomegaly and pulmonary venous congestion. Electronically Signed   By: Dorise Bullion III M.D   On: 05/19/2018 10:34     Antimicrobials:   None currently   subjective: Patient's expressed dissatisfaction with the current diet.  He did tolerate dialysis without complication.  Objective: Vitals:   05/24/18 2357 05/25/18 0555 05/25/18 0813 05/25/18 1252  BP: (!) 161/89 135/75 133/70 (!) 150/89  Pulse: 69 (!) 58 67 (!) 57  Resp: 13 13 14 11   Temp:  98.4 F (36.9 C) 98.6 F (37 C) 98.4 F (36.9 C)  TempSrc:  Oral Oral Oral  SpO2: 100% 100% 100% 100%  Weight:  120.5 kg    Height:        Intake/Output Summary (Last 24 hours) at 05/25/2018 1403 Last data filed at 05/25/2018 1308 Gross per 24 hour  Intake 852 ml  Output 2400 ml  Net -1548 ml   Filed Weights   05/24/18 1133 05/24/18 1600 05/25/18 0555  Weight: 120.8 kg 118.6 kg 120.5 kg    Examination:  General exam: Obese, appears calm and comfortable  Respiratory system: Clear to auscultation. Respiratory effort normal. Cardiovascular system: S1 & S2 heard, RRR. No JVD, murmurs, rubs, gallops or clicks. No pedal edema. Gastrointestinal system: Abdomen is nondistended, soft and nontender. No organomegaly or masses felt. Normal bowel sounds heard. Central nervous system: Alert and oriented. No focal neurological deficits. Extremities: 1-2+ pitting edema bilateral lower extremities, symmetric 5 x 5 power. Skin: No rashes, lesions or ulcers Psychiatry: Judgement and insight appear normal. Mood & affect mildly flat.     Data Reviewed: I have personally reviewed  following labs and imaging studies  CBC: Recent Labs  Lab 05/19/18 0936  05/20/18 0419 05/21/18 0419 05/22/18 0522 05/22/18 1230 05/24/18 0527 05/25/18 0500  WBC 4.6  --  3.4* 3.2* 3.8*  --  4.0 5.4  NEUTROABS 3.1  --   --  2.2  --   --   --   --   HGB 7.2*   < > 7.1* 7.1* 6.3* 7.1* 6.7* 7.8*  HCT 23.8*   < > 22.6* 22.1* 19.7* 21.4* 21.1* 23.0*  MCV 98.8  --  93.8 92.9 92.9  --  94.2 89.5  PLT 64*  --  55* 53* 60*  --  54* 64*   < > = values in this interval not displayed.   Basic Metabolic Panel: Recent Labs  Lab 05/21/18 0419 05/22/18 0522  05/23/18 0613 05/23/18 1516 05/24/18 0527 05/24/18 1720 05/25/18 0500  NA 139 136   < > 135 135 135 137 137  K 3.8 3.8   < > 3.8 3.8 4.0 2.8* 2.7*  CL 108 107   < > 107 109 109 102 106  CO2 23 19*   < > 18* 16* 17* 24 23  GLUCOSE 95 140*   < > 174* 283* 198* 155* 123*  BUN 25* 27*   < > 39* 42* 45* 17 20  CREATININE 3.67* 4.28*   < > 5.47* 5.75* 6.17* 3.01* 3.84*  CALCIUM 7.0* 7.0*   < >  7.4* 7.3* 7.6* 7.8* 7.6*  MG 2.5* 2.5*  --  2.6*  --  2.7*  --  2.3  PHOS 4.7* 5.0*   < > 5.9* 6.5* 7.8* 3.5 4.1   < > = values in this interval not displayed.   GFR: Estimated Creatinine Clearance: 32.8 mL/min (A) (by C-G formula based on SCr of 3.84 mg/dL (H)). Liver Function Tests: Recent Labs  Lab 05/19/18 0936  05/21/18 0419  05/23/18 0613 05/23/18 1516 05/24/18 0527 05/24/18 1720 05/25/18 0500  AST 12*  --  13*  --   --   --   --   --   --   ALT 16  --  15  --   --   --   --   --   --   ALKPHOS 140*  --  123  --   --   --   --   --   --   BILITOT 0.5  --  0.5  --   --   --   --   --   --   PROT 6.2*  --  5.6*  --   --   --   --   --   --   ALBUMIN 2.8*   < > 2.4*   < > 2.5* 2.4* 2.5* 2.4* 2.4*   < > = values in this interval not displayed.   Recent Labs  Lab 05/20/18 0419  LIPASE 20   Recent Labs  Lab 05/19/18 0943 05/21/18 0415 05/24/18 0523 05/25/18 0500  AMMONIA 48* 37* 33 29   Coagulation Profile: Recent Labs   Lab 05/19/18 1339 05/21/18 0419  INR 1.6* 1.7*   Cardiac Enzymes: No results for input(s): CKTOTAL, CKMB, CKMBINDEX, TROPONINI in the last 168 hours. BNP (last 3 results) No results for input(s): PROBNP in the last 8760 hours. HbA1C: No results for input(s): HGBA1C in the last 72 hours. CBG: Recent Labs  Lab 05/24/18 1700 05/24/18 2047 05/24/18 2356 05/25/18 0516 05/25/18 0812  GLUCAP 141* 247* 242* 130* 114*   Lipid Profile: Recent Labs    05/24/18 0528  CHOL 76  HDL 35*  LDLCALC 16  TRIG 123  CHOLHDL 2.2   Thyroid Function Tests: No results for input(s): TSH, T4TOTAL, FREET4, T3FREE, THYROIDAB in the last 72 hours. Anemia Panel: Recent Labs    05/24/18 1201  FERRITIN 174  TIBC 211*  IRON 76   Sepsis Labs: Recent Labs  Lab 05/19/18 0941  LATICACIDVEN 1.3    Recent Results (from the past 240 hour(s))  Blood Culture (routine x 2)     Status: None   Collection Time: 05/19/18  9:34 AM  Result Value Ref Range Status   Specimen Description BLOOD LEFT ANTECUBITAL  Final   Special Requests   Final    BOTTLES DRAWN AEROBIC AND ANAEROBIC Blood Culture adequate volume   Culture   Final    NO GROWTH 5 DAYS Performed at [facility], [place] [address] [place], [place] [address]    Report Status 05/24/2018 FINAL  Final  Urine culture     Status: None   Collection Time: 05/19/18  9:41 AM  Result Value Ref Range Status   Specimen Description URINE, RANDOM  Final   Special Requests NONE  Final   Culture   Final    NO GROWTH Performed at [facility], [place] [address], [place], [place] [address]    Report Status 05/20/2018 FINAL  Final  SARS  Coronavirus 2 [facility] order, Performed in [facility] hospital lab)     Status: None   Collection Time: 05/19/18  9:44 AM  Result Value Ref Range Status   SARS Coronavirus 2 NEGATIVE NEGATIVE Final    Comment: (NOTE) If result is NEGATIVE SARS-CoV-2 target nucleic acids are NOT DETECTED. The SARS-CoV-2 RNA  is generally detectable in upper and lower  respiratory specimens during the acute phase of infection. The lowest  concentration of SARS-CoV-2 viral copies this assay can detect is 250  copies / mL. A negative result does not preclude SARS-CoV-2 infection  and should not be used as the sole basis for treatment or other  patient management decisions.  A negative result may occur with  improper specimen collection / handling, submission of specimen other  than nasopharyngeal swab, presence of viral mutation(s) within the  areas targeted by this assay, and inadequate number of viral copies  (<250 copies / mL). A negative result must be combined with clinical  observations, patient history, and epidemiological information. If result is POSITIVE SARS-CoV-2 target nucleic acids are DETECTED. The SARS-CoV-2 RNA is generally detectable in upper and lower  respiratory specimens dur ing the acute phase of infection.  Positive  results are indicative of active infection with SARS-CoV-2.  Clinical  correlation with patient history and other diagnostic information is  necessary to determine patient infection status.  Positive results do  not rule out bacterial infection or co-infection with other viruses. If result is PRESUMPTIVE POSTIVE SARS-CoV-2 nucleic acids MAY BE PRESENT.   A presumptive positive result was obtained on the submitted specimen  and confirmed on repeat testing.  While 2019 novel coronavirus  (SARS-CoV-2) nucleic acids may be present in the submitted sample  additional confirmatory testing may be necessary for epidemiological  and / or clinical management purposes  to differentiate between  SARS-CoV-2 and other Sarbecovirus currently known to infect humans.  If clinically indicated additional testing with an alternate test  methodology [phone]) is advised. The SARS-CoV-2 RNA is generally  detectable in upper and lower respiratory sp ecimens during the acute  phase of  infection. The expected result is Negative. Fact Sheet for Patients:  StrictlyIdeas.no Fact Sheet for Healthcare Providers: BankingDealers.co.za This test is not yet approved or cleared by the [place] FDA and has been authorized for detection and/or diagnosis of SARS-CoV-2 by FDA under an Emergency Use Authorization (EUA).  This EUA will remain in effect (meaning this test can be used) for the duration of the COVID-19 declaration under Section 564(b)(1) of the Act, 21 U.S.C. section 360bbb-3(b)(1), unless the authorization is terminated or revoked sooner. Performed at [facility], [place] [address], [place], [place] [address]   MRSA PCR Screening     Status: None   Collection Time: 05/19/18 10:15 AM  Result Value Ref Range Status   MRSA by PCR NEGATIVE NEGATIVE Final    Comment:        The GeneXpert MRSA Assay (FDA approved for NASAL specimens only), is one component of a comprehensive MRSA colonization surveillance program. It is not intended to diagnose MRSA infection nor to guide or monitor treatment for MRSA infections. Performed at [facility], [place] [address] [place],  [address]   Blood Culture (routine x 2)     Status: None   Collection Time: 05/19/18  4:27 PM  Result Value Ref Range Status   Specimen Description BLOOD RIGHT HAND  Final   Special Requests AEROBIC BOTTLE ONLY Blood  Culture adequate volume  Final   Culture   Final    NO GROWTH 5 DAYS Performed at [facility], [place] [address], [place], [place] [address]    Report Status 05/24/2018 FINAL  Final         Radiology Studies: No results found.      Scheduled Meds:  sodium chloride   Intravenous Once   amLODipine  5 mg Oral Daily   carbamazepine  700 mg Oral BID   Chlorhexidine Gluconate Cloth  6 each Topical Q0600   ferrous sulfate  325 mg Oral Q breakfast   heparin injection (subcutaneous)  5,000 Units  Subcutaneous Q8H   insulin aspart  0-5 Units Subcutaneous QHS   insulin aspart  0-9 Units Subcutaneous Q4H   insulin glargine  15 Units Subcutaneous QHS   mouth rinse  15 mL Mouth Rinse BID   PHENobarbital  243 mg Oral QHS   simvastatin  20 mg Oral QHS   sodium chloride flush  10 mL Intravenous Q12H   tamsulosin  0.4 mg Oral QHS   Continuous Infusions:   LOS: 6 days    Time spent: 35 min    Nicolette Bang, MD [facility]  If 7PM-7AM, please contact night-coverage  05/25/2018, 2:03 PM

## 2018-05-25 NOTE — Progress Notes (Signed)
Renal Navigator received call from Dr. Cristal Generous to start referral process as patient is AKI and may need OP HD after discharge. Renal Navigator spoke with patient who states willingness to enter OP HD treatment if this is a continued need. Renal Navigator informed him that his SNF/[facility] will provide transportation on a MWF schedule and stressed the importance of willingness to go to every treatment. He agreed. Referral sent to [facility] in [place]. Renal Navigator requests afternoon seat time based on patient and facility staff's request. Renal Navigator will follow up with SNF, MD and patient once seat time has been secured.  Renal Navigator spoke with patient's sister Abigail Butts to inform her of plan for patient. She referred to herself as patient's "step sister" and informed Renal Navigator that she and "Bo" (what she calls patient) have the same mother, but that their mother made an adoption plan for her with a family member when she was 48 years old. She states their mother raised Bo. She reports that she rekindled a relationship with Bo and her mother about 10 years ago. They became much closer 3 years ago when their mother passed away. It was only 3 years ago when she learned of the cousin [place]. She told Renal Navigator that it is due to this situation that she feels strange to be the one to make decisions for her brother/patient, but that she is willing to do so. She wants to be involved and supportive. We talked about family as being those who are involved and supportive at any given time in a person's life. She states she is trying to get a cell phone to send to patient because she has had difficulty getting in contact with him at [facility] and wants to have more contact with him.  Renal Navigator will continue to follow closely through hospitalization until discharge to assist with smooth transition from hospital to OP HD clinic if it is determined that he needs OP  HD at time of discharge.  Terri Piedra [facility] Renal Navigator [phone]  Alphonzo Cruise [facility]

## 2018-05-25 NOTE — Progress Notes (Signed)
Physical Therapy Treatment Patient Details Name: ARTEZ REGIS MRN: [id number] DOB: Sep 06, 1970 Today's Date: 05/25/2018    History of Present Illness Pt adm with acute on chronic renal failure with metabolic acidosis and encephalopathy and started on CRRT on 4/25. Stopped CRRT on 4/27. PMH - recent admissions for respiratory failure with vent and septic shock, DM, seizures, htn, esrd, cognitive delays, cirrhosis    PT Comments    Pt with impaired cognition but improved ability with transfers and standing tolerance this session. Pt performing well with sit to stand transfers from varied height surfaces and use of stedy for transfers. Pt educated for continued mobility and HEP.   HR 86 with SpO2 100% on RA    Follow Up Recommendations  SNF;Supervision/Assistance - 24 hour     Equipment Recommendations  None recommended by PT    Recommendations for Other Services       Precautions / Restrictions Precautions Precautions: Fall Precaution Comments: incontinent of stool    Mobility  Bed Mobility Overal bed mobility: Needs Assistance Bed Mobility: Supine to Sit     Supine to sit: Min assist;HOB elevated     General bed mobility comments: HOB 20 degrees with cues to initiate and semiroll to left with assist to fully swing hips to EOB and elevate trunk. Sequential cues throughout  Transfers Overall transfer level: Needs assistance   Transfers: Sit to/from Stand Sit to Stand: Min assist;+2 safety/equipment;From elevated surface         General transfer comment: pt able to stand to stedy from elevate bed height with cues for hand placement, anterior translation and rise. Pt maintained standing grossly 47mn then performed additional 6 trials from stedy and 1 sit to stand from chair with armrests all with min assist. Transfer bed to chair via stedy. Stood with RW at chair to march but pt fatigued after 3 reps  Ambulation/Gait             General Gait Details: not yet  ready   SChief Strategy Officer   Modified Rankin (Stroke Patients Only)       Balance Overall balance assessment: Needs assistance Sitting-balance support: Bilateral upper extremity supported;Feet supported Sitting balance-Leahy Scale: Fair Sitting balance - Comments: pt able to sit EOB without assist   Standing balance support: Bilateral upper extremity supported Standing balance-Leahy Scale: Poor                              Cognition Arousal/Alertness: Awake/alert Behavior During Therapy: WFL for tasks assessed/performed Overall Cognitive Status: No family/caregiver present to determine baseline cognitive functioning Area of Impairment: Orientation;Memory;Following commands;Problem solving;Safety/judgement;Attention                 Orientation Level: Disoriented to;Situation;Time Current Attention Level: Sustained Memory: Decreased short-term memory Following Commands: Follows one step commands with increased time Safety/Judgement: Decreased awareness of deficits   Problem Solving: Slow processing;Difficulty sequencing;Decreased initiation;Requires verbal cues;Requires tactile cues General Comments: Pt with baseline cognitive deficits. Pt following commands consistently. Pt perseverative on lines not being disconnected, food incorrect and wanting to talk to the MD      Exercises General Exercises - Lower Extremity Long Arc Quad: AROM;15 reps;Seated;Both Hip Flexion/Marching: AROM;15 reps;Seated;Both Toe Raises: AROM;15 reps;Seated;Both Heel Raises: AROM;15 reps;Seated;Both    General Comments        Pertinent Vitals/Pain Pain Assessment: No/denies pain  Home Living                      Prior Function            PT Goals (current goals can now be found in the care plan section) Progress towards PT goals: Progressing toward goals    Frequency           PT Plan Current plan remains appropriate     Co-evaluation              AM-PAC PT "6 Clicks" Mobility   Outcome Measure  Help needed turning from your back to your side while in a flat bed without using bedrails?: A Little Help needed moving from lying on your back to sitting on the side of a flat bed without using bedrails?: A Little Help needed moving to and from a bed to a chair (including a wheelchair)?: A Lot Help needed standing up from a chair using your arms (e.g., wheelchair or bedside chair)?: A Little Help needed to walk in hospital room?: Total Help needed climbing 3-5 steps with a railing? : Total 6 Click Score: 13    End of Session Equipment Utilized During Treatment: Gait belt Activity Tolerance: Patient tolerated treatment well Patient left: with call bell/phone within reach;with chair alarm set;in chair Nurse Communication: Mobility status PT Visit Diagnosis: Other abnormalities of gait and mobility (R26.89);Muscle weakness (generalized) (M62.81)     Time: 8979-1504 PT Time Calculation (min) (ACUTE ONLY): 20 min  Charges:  $Therapeutic Activity: 8-22 mins                     [place], PT Acute Rehabilitation Services Pager: [phone] Office: [place] 05/25/2018, 12:42 PM

## 2018-05-26 DIAGNOSIS — Z992 Dependence on renal dialysis: Secondary | ICD-10-CM

## 2018-05-26 DIAGNOSIS — N179 Acute kidney failure, unspecified: Secondary | ICD-10-CM

## 2018-05-26 LAB — RENAL FUNCTION PANEL
Albumin: 2.4 g/dL — ABNORMAL LOW (ref 3.5–5.0)
Albumin: 2.4 g/dL — ABNORMAL LOW (ref 3.5–5.0)
Anion gap: 8 (ref 5–15)
Anion gap: 10 (ref 5–15)
BUN: 22 mg/dL — ABNORMAL HIGH (ref 6–20)
BUN: 24 mg/dL — ABNORMAL HIGH (ref 6–20)
CO2: 20 mmol/L — ABNORMAL LOW (ref 22–32)
CO2: 20 mmol/L — ABNORMAL LOW (ref 22–32)
Calcium: 7.7 mg/dL — ABNORMAL LOW (ref 8.9–10.3)
Calcium: 7.8 mg/dL — ABNORMAL LOW (ref 8.9–10.3)
Chloride: 110 mmol/L (ref 98–111)
Chloride: 109 mmol/L (ref 98–111)
Creatinine, Ser: 5.02 mg/dL — ABNORMAL HIGH (ref 0.61–1.24)
Creatinine, Ser: 5.34 mg/dL — ABNORMAL HIGH (ref 0.61–1.24)
GFR calc Af Amer: 15 mL/min — ABNORMAL LOW (ref >60)
GFR calc Af Amer: 14 mL/min — ABNORMAL LOW (ref >60)
GFR calc non Af Amer: 13 mL/min — ABNORMAL LOW (ref >60)
GFR calc non Af Amer: 12 mL/min — ABNORMAL LOW (ref >60)
Glucose, Bld: 139 mg/dL — ABNORMAL HIGH (ref 70–99)
Glucose, Bld: 132 mg/dL — ABNORMAL HIGH (ref 70–99)
Phosphorus: 5.7 mg/dL — ABNORMAL HIGH (ref 2.5–4.6)
Phosphorus: 6 mg/dL — ABNORMAL HIGH (ref 2.5–4.6)
Potassium: 3.3 mmol/L — ABNORMAL LOW (ref 3.5–5.1)
Potassium: 3.2 mmol/L — ABNORMAL LOW (ref 3.5–5.1)
Sodium: 138 mmol/L (ref 135–145)
Sodium: 139 mmol/L (ref 135–145)

## 2018-05-26 LAB — GLUCOSE, CAPILLARY
Glucose-Capillary: 113 mg/dL — ABNORMAL HIGH (ref 70–99)
Glucose-Capillary: 127 mg/dL — ABNORMAL HIGH (ref 70–99)
Glucose-Capillary: 140 mg/dL — ABNORMAL HIGH (ref 70–99)
Glucose-Capillary: 145 mg/dL — ABNORMAL HIGH (ref 70–99)
Glucose-Capillary: 147 mg/dL — ABNORMAL HIGH (ref 70–99)
Glucose-Capillary: 181 mg/dL — ABNORMAL HIGH (ref 70–99)
Glucose-Capillary: 85 mg/dL (ref 70–99)

## 2018-05-26 LAB — AMMONIA: Ammonia: 52 umol/L — ABNORMAL HIGH (ref 9–35)

## 2018-05-26 LAB — CBC
HCT: 25 % — ABNORMAL LOW (ref 39.0–52.0)
Hemoglobin: 8.4 g/dL — ABNORMAL LOW (ref 13.0–17.0)
MCH: 30.5 pg (ref 26.0–34.0)
MCHC: 33.6 g/dL (ref 30.0–36.0)
MCV: 90.9 fL (ref 80.0–100.0)
Platelets: 76 10*3/uL — ABNORMAL LOW (ref 150–400)
RBC: 2.75 MIL/uL — ABNORMAL LOW (ref 4.22–5.81)
RDW: 15.9 % — ABNORMAL HIGH (ref 11.5–15.5)
WBC: 6.4 10*3/uL (ref 4.0–10.5)
nRBC: 0 % (ref 0.0–0.2)

## 2018-05-26 LAB — MAGNESIUM: Magnesium: 2.3 mg/dL (ref 1.7–2.4)

## 2018-05-26 MED ORDER — DARBEPOETIN ALFA 100 MCG/0.5ML IJ SOSY
100.0000 ug | PREFILLED_SYRINGE | Freq: Once | INTRAMUSCULAR | Status: AC
  Administered 2018-05-27: 13:00:00 100 ug via SUBCUTANEOUS
  Filled 2018-05-26: qty 0.5

## 2018-05-26 MED ORDER — DARBEPOETIN ALFA 100 MCG/0.5ML IJ SOSY
100.0000 ug | PREFILLED_SYRINGE | Freq: Once | INTRAMUSCULAR | Status: DC
  Filled 2018-05-26: qty 0.5

## 2018-05-26 NOTE — Progress Notes (Signed)
Admit: 05/19/2018 LOS: 7  56M Cirrhosis, CKD (Labile SCr, perhaps SCr as low as 1.9) admitted with AMS and AKI.  Recent [facility] admit for pneumonia / VDRF  Subjective:  . No new issues . 0.3L UOP yesterday . K 3.3  . Has right IJ temp HD cath, no bleeding   05/01 0701 - 05/02 0700 In: 222 [P.O.:222] Out: 250 [Urine:250]  Filed Weights   05/24/18 1600 05/25/18 0555 05/26/18 0404  Weight: 118.6 kg 120.5 kg 118.5 kg    Scheduled Meds: . sodium chloride   Intravenous Once  . amLODipine  5 mg Oral Daily  . carbamazepine  700 mg Oral BID  . Chlorhexidine Gluconate Cloth  6 each Topical Q0600  . ferrous sulfate  325 mg Oral Q breakfast  . heparin injection (subcutaneous)  5,000 Units Subcutaneous Q8H  . insulin aspart  0-5 Units Subcutaneous QHS  . insulin aspart  0-9 Units Subcutaneous Q4H  . insulin glargine  15 Units Subcutaneous QHS  . mouth rinse  15 mL Mouth Rinse BID  . PHENobarbital  243 mg Oral QHS  . simvastatin  20 mg Oral QHS  . sodium chloride flush  10 mL Intravenous Q12H  . tamsulosin  0.4 mg Oral QHS   Continuous Infusions: PRN Meds:.heparin, ondansetron (ZOFRAN) IV, sodium chloride flush  Current Labs: reviewed  Results for Eddie Davies, Eddie Davies (MRN [id number]) as of 05/26/2018 11:52  Ref. Range 05/24/2018 12:01  Saturation Ratios Latest Ref Range: 17.9 - 39.5 % 36  Ferritin Latest Ref Range: 24 - 336 ng/mL 174    Physical Exam:  Blood pressure 111/60, pulse (!) 59, temperature 97.7 F (36.5 C), temperature source Oral, resp. rate 17, height 6' 2"  (1.88 m), weight 118.5 kg, SpO2 99 %. Obese, NAD RRR nl s1s2 CTAB Ichythosis present No sig LEE S/nt/nd Nonfocal Foley cath in place  A 1. Dialysis dependent AKI, labile baseline CKD; CT A/P at admit with b/l nonobstructive nephrolithiasis; probably ATN 2. Cirrhosis 3. Morbid obesity 4. Long term SNF resident  5. B/l nonobstructive nephrolithiasis 6. Anemia TSAT 36% 7. Hx/o seizures 8. AMS  resolved 9. Metabolic acidosis, stable 10. Temp R IJ Cath present  P . Remains HD dep't; will try to monitor UOP and labs over wekend, No HD today.  If no improvement probably will need TDC and consideration of outpt AKI Clip . Aranesp 160mg today . Medication Issues; o Preferred narcotic agents for pain control are hydromorphone, fentanyl, and methadone. Morphine should not be used.  o Baclofen should be avoided o Avoid oral sodium phosphate and magnesium citrate based laxatives / bowel preps    RPearson GrippeMD 05/26/2018, 11:51 AM  Recent Labs  Lab 05/25/18 0500 05/25/18 1522 05/26/18 0731  NA 137 137 138  K 2.7* 3.1* 3.3*  CL 106 106 110  CO2 23 21* 20*  GLUCOSE 123* 271* 139*  BUN 20 21* 22*  CREATININE 3.84* 4.21* 5.02*  CALCIUM 7.6* 7.7* 7.7*  PHOS 4.1 3.9 5.7*   Recent Labs  Lab 05/21/18 0419  05/24/18 0527 05/25/18 0500 05/26/18 0731  WBC 3.2*   < > 4.0 5.4 6.4  NEUTROABS 2.2  --   --   --   --   HGB 7.1*   < > 6.7* 7.8* 8.4*  HCT 22.1*   < > 21.1* 23.0* 25.0*  MCV 92.9   < > 94.2 89.5 90.9  PLT 53*   < > 54* 64* 76*   < > = values  in this interval not displayed.

## 2018-05-26 NOTE — Progress Notes (Addendum)
PROGRESS NOTE    Eddie Davies  RCB:[id number] DOB: [birth year] DOA: 05/19/2018 PCP: [facility]   Brief Narrative:  48 y.o.WM PMHxSeizures, CKD stage 3, presumed liver cirrhosis, HTN, hypertension, who presented with AMS. He washospitalized from 3/8-21 and 3/28-4/11 for acute metabolic encephalopathysecondary to septic shock from ESBL positive Klebsiella and E faecalis UTI with subsequent hypoxic respiratory failure in the setting of ESBL PNA, treated with meropenem and requiring mechanical ventilation and CRRT during hospitalizations.  ED Course:Encephalopathy, UTI, AKI. Complex patient, recently discharged. Worsening confusion, elevated creatinine and NH4 levels. Hypotensive on arrival, rectal temp 88, transiently hypoxic. Labs with UTI. Creatinine up to 8.11, BUN 64. Hgb is low but stable. CXR with vascular congestion, mild. Pharmacy suggested Cefepime and Vanc was reasonable. MRSA PCR is pending. Given mild IVF. BP 91/51. Has not given sepsis bolus. PCCM to evaluate to determine if admission to their service is needed and she will let me know.  Patient was admitted to PCCM with acute renal failure and metabolic acidosis. Received CRRT in ICU by renal. Renal function and acidosis improved. Transferred to [facility] 4/28   Assessment & Plan:   Active Problems:   Altered mental status, unspecified   Metabolic acidosis   Confusion   Acute metabolic encephalopathy   Seizures (HCC)   BPH (benign prostatic hyperplasia)   Sepsis (HCC)   Benign essential HTN   HLD (hyperlipidemia)   Diabetes mellitus type 2, uncontrolled, with complications (HCC)   Thrombocytopenia (HCC)   Acute on CKD stage III (baseline Cr ~1.8) -Cr peak= 8.34, now 5.02 -Per nephrology most likely secondary to ATN -Continue HD per nephrology. - S/p CRRT. Nephrology plans to follow with daily HD -Strict in and out +1 L - Daily weights - planned MWF   Acute metabolic  encephalopathy -Resolved, answering questions appropriately  Hx seizures -No evidence of seizure activity. -Carbamazepine 700 mg twice daily -Carbamazepine level pending - Phenobarbital 243 mg nightly -Phenobarbital level=11 -Gabapentin on hold  Hx of Cirrhosis - Patient on rifaximin and lactulose at home, these were DC'd in setting of normal-appearing liver on CT by PCCM - Patient with low albumin, normal bili, low platelets mildly elevated INR would continue to monitor closely -Monitor ammonia level now that home medication discontinued, most recent 29 on May 1  BPH -Flomax -total output 250 recorded for last 24 hours  Anemia - 4/27transfused 1 unit PRBC - During hospitalization transfused 2 units PRBC, 1 unit FFP? -Anemia that will reviewed - 4/30transfused 2 units PRBC  Sepsis -Ruled out - Blood cultures NGTD - Urine culture negative  Essential HTN - 4/29 amlodipine 5 mg daily, cont to monitor blood pressure -Control with HD  HLD -LDL within ADA guidelines -Hold Zocor  Diabetes type 2 uncontrolled with complication - 8/03 hemoglobin A1c= 8.3 -4/30 increase Lantus 15 units daily, continue to monitor blood glucose - Sensitive SSI  Thrombocytopenia: -Low but stable no evidence of bleeding  DVT prophylaxis: Heparin SQ  Code Status: Full    Code Status Orders  (From admission, onward)         Start     Ordered   05/19/18 1446  Full code  Continuous     05/19/18 1447        Code Status History    Date Active Date Inactive Code Status Order ID Comments User Context   04/21/2018 2247 05/05/2018 1938 Full Code [id number]  Garrel Ridgel, MD Inpatient   04/07/2018 1858 04/14/2018 1642 Full Code [id number]  Erick Colace, NP Inpatient    Advance Directive Documentation     Most Recent Value  Type of Advance Directive  Healthcare Power of Attorney  Pre-existing out of facility DNR order (yellow form or pink MOST form)  -  "MOST" Form  in Place?  -     Family Communication: Tried reaching Mosie Lukes, [place], V-Mail full Disposition Plan:    Patient will require continued inpatient treatment for subsepcialty evaluation, dialysis, frequent nursing care, electrolyte monitoring, and fluid management.  Patient at risk of volume overload and respiratory and cardiac arrest without these interventions.  Anticipate return to SNF with HD as outpt Consults called: None Admission status: Inpatient   Consultants:   nephrology  Procedures:  Ct Abdomen Pelvis Wo Contrast  Result Date: 05/19/2018 CLINICAL DATA:  Increased creatinine and ammonia levels. Altered mental status. Slurred speech. EXAM: CT ABDOMEN AND PELVIS WITHOUT CONTRAST TECHNIQUE: Multidetector CT imaging of the abdomen and pelvis was performed following the standard protocol without IV contrast. COMPARISON:  07/09/2017 FINDINGS: Lower chest: Dependent atelectasis. Mild coronary artery calcification. Hepatobiliary: Postcholecystectomy. Unremarkable appearance of the liver. Pancreas: There is fatty stranding in an about the length of the pancreas. Scattered pancreatic parenchymal calcifications are present suggesting chronic pancreatitis. No obvious mass or ductal dilatation. Spleen: Unremarkable Adrenals/Urinary Tract: Prominent perinephric stranding bilaterally is similar to that seen on the prior study. Small punctate calculi throughout the collecting system of the left kidney. 1.4 cm calculus in the right renal pelvis. Adrenal glands are within normal limits. Foley catheter decompresses the bladder. Stomach/Bowel: No disproportionate dilatation of bowel. No obvious mass in the colon. Unremarkable stomach. Vascular/Lymphatic: Minimal atherosclerotic vascular calcifications. Several subcentimeter short axis diameter para-aortic lymph nodes. Reproductive: Unremarkable prostate. Other: There is no free fluid. There is stranding in the fat along the paracolic gutters and in the  presacral space. There is also stranding in the subcutaneous fat. Musculoskeletal: No vertebral compression deformity. IMPRESSION: There is stranding within the fat in an about the pancreas suggesting an inflammatory process. Correlation with amylase and lipase is recommended. Pancreatic calcifications are compatible with chronic pancreatitis. There is stable prominent bilateral perinephric stranding. These findings may represent a recurrent inflammatory process of the kidneys. Bilateral nephrolithiasis. Stranding within the retroperitoneal fat suggesting anasarca. Electronically Signed   By: Marybelle Killings M.D.   On: 05/19/2018 15:41   Dg Chest Port 1 View  Result Date: 05/21/2018 CLINICAL DATA:  Abnormal respiration. EXAM: PORTABLE CHEST 1 VIEW COMPARISON:  Radiograph of May 19, 2018. FINDINGS: Stable cardiomegaly. Right internal jugular catheter is unchanged in position. No pneumothorax is noted. Left lung base is not included in field-of-view. No definite pleural effusion is seen within the visualized lung fields. Bony thorax is unremarkable. No consolidative process is noted. IMPRESSION: Stable cardiomegaly.  No acute pulmonary abnormality seen. Electronically Signed   By: Marijo Conception M.D.   On: 05/21/2018 07:50   Dg Chest Port 1 View  Result Date: 05/19/2018 CLINICAL DATA:  Bedside central venous catheter placement. EXAM: PORTABLE CHEST 1 VIEW 8:55 p.m.: COMPARISON:  Portable chest x-ray earlier today at 10:03 a.m. and previously. FINDINGS: RIGHT jugular central venous catheter tip projects over the UPPER SVC. No evidence of pneumothorax or mediastinal hematoma. Near expiratory image which accounts for the bibasilar atelectasis. New mild perihilar airspace pulmonary edema since the examination earlier this morning. IMPRESSION: 1. RIGHT jugular central venous catheter tip projects over the SVC. No acute complicating features. 2. New mild perihilar airspace pulmonary edema since  the examination  earlier this morning. Electronically Signed   By: Evangeline Dakin M.D.   On: 05/19/2018 21:06   Dg Chest Port 1 View  Result Date: 05/19/2018 CLINICAL DATA:  Increased creatinine and ammonia levels. Surge speech. EXAM: PORTABLE CHEST 1 VIEW COMPARISON:  May 16, 2018 FINDINGS: Stable cardiomegaly. Mild pulmonary venous congestion without overt edema. No focal infiltrate. No pneumothorax. IMPRESSION: Cardiomegaly and pulmonary venous congestion. Electronically Signed   By: Dorise Bullion III M.D   On: 05/19/2018 10:34     Antimicrobials:   None currently   Subjective: Pt resting in bed this AM eating breakfast, no complaints, confirmed SNF as POC, no complaints, no pain/N/V/Diarrhea  Objective: Vitals:   05/25/18 2000 05/25/18 2123 05/26/18 0404 05/26/18 0946  BP:  130/83 131/88 124/74  Pulse: 77 (!) 59 (!) 55 70  Resp: 18 (!) 21 10 18   Temp:  97.6 F (36.4 C) 97.7 F (36.5 C)   TempSrc:  Oral Oral   SpO2: 100%  100% 100%  Weight:   118.5 kg   Height:        Intake/Output Summary (Last 24 hours) at 05/26/2018 1055 Last data filed at 05/26/2018 0341 Gross per 24 hour  Intake 222 ml  Output 250 ml  Net -28 ml   Filed Weights   05/24/18 1600 05/25/18 0555 05/26/18 0404  Weight: 118.6 kg 120.5 kg 118.5 kg    Examination:  General exam: Appears calm and comfortable  Respiratory system: Clear to auscultation. Respiratory effort normal. Cardiovascular system: S1 & S2 heard, RRR. No JVD, murmurs, rubs, gallops or clicks. No pedal edema. Gastrointestinal system: Abdomen is nondistended, soft and nontender. No organomegaly or masses felt. Normal bowel sounds heard. Central nervous system: Alert and oriented. No focal neurological deficits. Extremities: wwp, trace bilat le edema. Skin: eczematous rash-no purulent lesions or ulcers Psychiatry: Judgement and insight appear normal. Mood & affect wnl.     Data Reviewed: I have personally reviewed following labs and imaging  studies  CBC: Recent Labs  Lab 05/21/18 0419 05/22/18 0522 05/22/18 1230 05/24/18 0527 05/25/18 0500 05/26/18 0731  WBC 3.2* 3.8*  --  4.0 5.4 6.4  NEUTROABS 2.2  --   --   --   --   --   HGB 7.1* 6.3* 7.1* 6.7* 7.8* 8.4*  HCT 22.1* 19.7* 21.4* 21.1* 23.0* 25.0*  MCV 92.9 92.9  --  94.2 89.5 90.9  PLT 53* 60*  --  54* 64* 76*   Basic Metabolic Panel: Recent Labs  Lab 05/22/18 0522  05/23/18 0613  05/24/18 0527 05/24/18 1720 05/25/18 0500 05/25/18 1522 05/26/18 0731  NA 136   < > 135   < > 135 137 137 137 138  K 3.8   < > 3.8   < > 4.0 2.8* 2.7* 3.1* 3.3*  CL 107   < > 107   < > 109 102 106 106 110  CO2 19*   < > 18*   < > 17* 24 23 21* 20*  GLUCOSE 140*   < > 174*   < > 198* 155* 123* 271* 139*  BUN 27*   < > 39*   < > 45* 17 20 21* 22*  CREATININE 4.28*   < > 5.47*   < > 6.17* 3.01* 3.84* 4.21* 5.02*  CALCIUM 7.0*   < > 7.4*   < > 7.6* 7.8* 7.6* 7.7* 7.7*  MG 2.5*  --  2.6*  --  2.7*  --  2.3  --  2.3  PHOS 5.0*   < > 5.9*   < > 7.8* 3.5 4.1 3.9 5.7*   < > = values in this interval not displayed.   GFR: Estimated Creatinine Clearance: 24.9 mL/min (A) (by C-G formula based on SCr of 5.02 mg/dL (H)). Liver Function Tests: Recent Labs  Lab 05/21/18 0419  05/24/18 0527 05/24/18 1720 05/25/18 0500 05/25/18 1522 05/26/18 0731  AST 13*  --   --   --   --   --   --   ALT 15  --   --   --   --   --   --   ALKPHOS 123  --   --   --   --   --   --   BILITOT 0.5  --   --   --   --   --   --   PROT 5.6*  --   --   --   --   --   --   ALBUMIN 2.4*   < > 2.5* 2.4* 2.4* 2.6* 2.4*   < > = values in this interval not displayed.   Recent Labs  Lab 05/20/18 0419  LIPASE 20   Recent Labs  Lab 05/21/18 0415 05/24/18 0523 05/25/18 0500 05/26/18 0731  AMMONIA 37* 33 29 52*   Coagulation Profile: Recent Labs  Lab 05/19/18 1339 05/21/18 0419  INR 1.6* 1.7*   Cardiac Enzymes: No results for input(s): CKTOTAL, CKMB, CKMBINDEX, TROPONINI in the last 168 hours. BNP  (last 3 results) No results for input(s): PROBNP in the last 8760 hours. HbA1C: No results for input(s): HGBA1C in the last 72 hours. CBG: Recent Labs  Lab 05/25/18 1558 05/25/18 2122 05/26/18 0037 05/26/18 0402 05/26/18 0753  GLUCAP 267* 215* 113* 85 145*   Lipid Profile: Recent Labs    05/24/18 0528  CHOL 76  HDL 35*  LDLCALC 16  TRIG 123  CHOLHDL 2.2   Thyroid Function Tests: No results for input(s): TSH, T4TOTAL, FREET4, T3FREE, THYROIDAB in the last 72 hours. Anemia Panel: Recent Labs    05/24/18 1201  FERRITIN 174  TIBC 211*  IRON 76   Sepsis Labs: No results for input(s): PROCALCITON, LATICACIDVEN in the last 168 hours.  Recent Results (from the past 240 hour(s))  Blood Culture (routine x 2)     Status: None   Collection Time: 05/19/18  9:34 AM  Result Value Ref Range Status   Specimen Description BLOOD LEFT ANTECUBITAL  Final   Special Requests   Final    BOTTLES DRAWN AEROBIC AND ANAEROBIC Blood Culture adequate volume   Culture   Final    NO GROWTH 5 DAYS Performed at [facility], [address], [place], [place] [address]    Report Status 05/24/2018 FINAL  Final  Urine culture     Status: None   Collection Time: 05/19/18  9:41 AM  Result Value Ref Range Status   Specimen Description URINE, RANDOM  Final   Special Requests NONE  Final   Culture   Final    NO GROWTH Performed at [facility], [place] [address] [place], [place] [address]    Report Status 05/20/2018 FINAL  Final  SARS Coronavirus 2 [facility] order, Performed in [place] hospital lab)     Status: None   Collection Time: 05/19/18  9:44 AM  Result Value Ref Range Status   SARS Coronavirus 2 NEGATIVE NEGATIVE Final  Comment: (NOTE) If result is NEGATIVE SARS-CoV-2 target nucleic acids are NOT DETECTED. The SARS-CoV-2 RNA is generally detectable in upper and lower  respiratory specimens during the acute phase of infection. The lowest  concentration of SARS-CoV-2  viral copies this assay can detect is 250  copies / mL. A negative result does not preclude SARS-CoV-2 infection  and should not be used as the sole basis for treatment or other  patient management decisions.  A negative result may occur with  improper specimen collection / handling, submission of specimen other  than nasopharyngeal swab, presence of viral mutation(s) within the  areas targeted by this assay, and inadequate number of viral copies  (<250 copies / mL). A negative result must be combined with clinical  observations, patient history, and epidemiological information. If result is POSITIVE SARS-CoV-2 target nucleic acids are DETECTED. The SARS-CoV-2 RNA is generally detectable in upper and lower  respiratory specimens dur ing the acute phase of infection.  Positive  results are indicative of active infection with SARS-CoV-2.  Clinical  correlation with patient history and other diagnostic information is  necessary to determine patient infection status.  Positive results do  not rule out bacterial infection or co-infection with other viruses. If result is PRESUMPTIVE POSTIVE SARS-CoV-2 nucleic acids MAY BE PRESENT.   A presumptive positive result was obtained on the submitted specimen  and confirmed on repeat testing.  While 2019 novel coronavirus  (SARS-CoV-2) nucleic acids may be present in the submitted sample  additional confirmatory testing may be necessary for epidemiological  and / or clinical management purposes  to differentiate between  SARS-CoV-2 and other Sarbecovirus currently known to infect humans.  If clinically indicated additional testing with an alternate test  methodology [phone]) is advised. The SARS-CoV-2 RNA is generally  detectable in upper and lower respiratory sp ecimens during the acute  phase of infection. The expected result is Negative. Fact Sheet for Patients:  StrictlyIdeas.no Fact Sheet for Healthcare Providers:  BankingDealers.co.za This test is not yet approved or cleared by the [place] FDA and has been authorized for detection and/or diagnosis of SARS-CoV-2 by FDA under an Emergency Use Authorization (EUA).  This EUA will remain in effect (meaning this test can be used) for the duration of the COVID-19 declaration under Section 564(b)(1) of the Act, 21 U.S.C. section 360bbb-3(b)(1), unless the authorization is terminated or revoked sooner. Performed at [facility], [place] [address] [place], [place] [address]   MRSA PCR Screening     Status: None   Collection Time: 05/19/18 10:15 AM  Result Value Ref Range Status   MRSA by PCR NEGATIVE NEGATIVE Final    Comment:        The GeneXpert MRSA Assay (FDA approved for NASAL specimens only), is one component of a comprehensive MRSA colonization surveillance program. It is not intended to diagnose MRSA infection nor to guide or monitor treatment for MRSA infections. Performed at [facility], [place] [address], [place], [place] [address]   Blood Culture (routine x 2)     Status: None   Collection Time: 05/19/18  4:27 PM  Result Value Ref Range Status   Specimen Description BLOOD RIGHT HAND  Final   Special Requests AEROBIC BOTTLE ONLY Blood Culture adequate volume  Final   Culture   Final    NO GROWTH 5 DAYS Performed at [facility], [place] [address] [place],  [address]    Report Status 05/24/2018 FINAL  Final  Radiology Studies: No results found.      Scheduled Meds: . sodium chloride   Intravenous Once  . amLODipine  5 mg Oral Daily  . carbamazepine  700 mg Oral BID  . Chlorhexidine Gluconate Cloth  6 each Topical Q0600  . ferrous sulfate  325 mg Oral Q breakfast  . heparin injection (subcutaneous)  5,000 Units Subcutaneous Q8H  . insulin aspart  0-5 Units Subcutaneous QHS  . insulin aspart  0-9 Units Subcutaneous Q4H  . insulin glargine  15 Units Subcutaneous QHS   . mouth rinse  15 mL Mouth Rinse BID  . PHENobarbital  243 mg Oral QHS  . simvastatin  20 mg Oral QHS  . sodium chloride flush  10 mL Intravenous Q12H  . tamsulosin  0.4 mg Oral QHS   Continuous Infusions:   LOS: 7 days    Time spent: 35 min    Nicolette Bang, MD [facility]  If 7PM-7AM, please contact night-coverage  05/26/2018, 10:55 AM

## 2018-05-27 LAB — AMMONIA: Ammonia: 47 umol/L — ABNORMAL HIGH (ref 9–35)

## 2018-05-27 LAB — RENAL FUNCTION PANEL
Albumin: 2.3 g/dL — ABNORMAL LOW (ref 3.5–5.0)
Anion gap: 7 (ref 5–15)
BUN: 29 mg/dL — ABNORMAL HIGH (ref 6–20)
CO2: 20 mmol/L — ABNORMAL LOW (ref 22–32)
Calcium: 7.8 mg/dL — ABNORMAL LOW (ref 8.9–10.3)
Chloride: 109 mmol/L (ref 98–111)
Creatinine, Ser: 6.36 mg/dL — ABNORMAL HIGH (ref 0.61–1.24)
GFR calc Af Amer: 11 mL/min — ABNORMAL LOW (ref >60)
GFR calc non Af Amer: 10 mL/min — ABNORMAL LOW (ref >60)
Glucose, Bld: 125 mg/dL — ABNORMAL HIGH (ref 70–99)
Phosphorus: 7.8 mg/dL — ABNORMAL HIGH (ref 2.5–4.6)
Potassium: 3.8 mmol/L (ref 3.5–5.1)
Sodium: 136 mmol/L (ref 135–145)

## 2018-05-27 LAB — MAGNESIUM: Magnesium: 2.4 mg/dL (ref 1.7–2.4)

## 2018-05-27 LAB — GLUCOSE, CAPILLARY
Glucose-Capillary: 135 mg/dL — ABNORMAL HIGH (ref 70–99)
Glucose-Capillary: 139 mg/dL — ABNORMAL HIGH (ref 70–99)
Glucose-Capillary: 161 mg/dL — ABNORMAL HIGH (ref 70–99)
Glucose-Capillary: 174 mg/dL — ABNORMAL HIGH (ref 70–99)
Glucose-Capillary: 176 mg/dL — ABNORMAL HIGH (ref 70–99)
Glucose-Capillary: 187 mg/dL — ABNORMAL HIGH (ref 70–99)

## 2018-05-27 LAB — CBC
HCT: 23.7 % — ABNORMAL LOW (ref 39.0–52.0)
Hemoglobin: 7.5 g/dL — ABNORMAL LOW (ref 13.0–17.0)
MCH: 29.6 pg (ref 26.0–34.0)
MCHC: 31.6 g/dL (ref 30.0–36.0)
MCV: 93.7 fL (ref 80.0–100.0)
Platelets: 72 10*3/uL — ABNORMAL LOW (ref 150–400)
RBC: 2.53 MIL/uL — ABNORMAL LOW (ref 4.22–5.81)
RDW: 15.8 % — ABNORMAL HIGH (ref 11.5–15.5)
WBC: 6.1 10*3/uL (ref 4.0–10.5)
nRBC: 0 % (ref 0.0–0.2)

## 2018-05-27 NOTE — Progress Notes (Signed)
PROGRESS NOTE    Eddie Davies  KGU:[id number] DOB: 08-23-1970 DOA: 05/19/2018 PCP: [facility]   Brief Narrative:  48 y.o.WM PMHxSeizures, CKD stage 3, presumed liver cirrhosis, HTN, hypertension,who presentedwith AMS. He washospitalized from 3/8-21 and 3/28-4/11 for acute metabolic encephalopathysecondary to septic shock from ESBL positive Klebsiella and E faecalis UTI with subsequent hypoxic respiratory failure in the setting of ESBL PNA, treated with meropenem and requiring mechanical ventilation and CRRT during hospitalizations.  ED Course:Encephalopathy, UTI, AKI. Complex patient, recently discharged. Worsening confusion, elevated creatinine and NH4 levels. Hypotensive on arrival, rectal temp 88, transiently hypoxic. Labs with UTI. Creatinine up to 8.11, BUN 64. Hgb is low but stable. CXR with vascular congestion, mild. Pharmacy suggested Cefepime and Vanc was reasonable. MRSA PCR is pending. Given mild IVF. BP 91/51. Has not given sepsis bolus. PCCM to evaluate to determine if admission to their service is needed and she will let me know.  Patient was admitted to PCCM with acute renal failure and metabolic acidosis. Received CRRT in ICU by renal. Renal function and acidosis improved. Transferred to [facility] 4/28    Assessment & Plan:   Active Problems:   Altered mental status, unspecified   Metabolic acidosis   Confusion   Acute metabolic encephalopathy   Seizures (HCC)   BPH (benign prostatic hyperplasia)   Sepsis (HCC)   Benign essential HTN   HLD (hyperlipidemia)   Diabetes mellitus type 2, uncontrolled, with complications (HCC)   Thrombocytopenia (HCC)   Acute on CKD stage III (baseline Cr ~1.8) -Cr peak= 8.34,now 5.34 -Per nephrology most likely secondary to ATN -Continue HD per nephrology. - S/p CRRT. Nephrology plans to follow with daily HD -Strict in and out+1 L -Daily weights - planned MWF  - NPO, poss TDC  tomorrow  Acute metabolic encephalopathy -Resolved, answering questions appropriately  Hx seizures -No evidence of seizure activity. -Carbamazepine 700 mg twice daily -Carbamazepine level pending - Phenobarbital 243 mg nightly -Phenobarbital level=11 -Gabapentin on hold  Hx of Cirrhosis - Patient on rifaximin and lactulose at home, these were DC'd in setting of normal-appearing liver on CT by PCCM - Patient with low albumin, normal bili, low platelets mildly elevated INR would continue to monitor closely -Monitor ammonia level now that home medication discontinued, most recent 29 on May 1  BPH -Flomax -total output 200 recorded for last 24 hours  Anemia - 4/27transfused 1 unit PRBC - During hospitalization transfused 2 units PRBC, 1 unit FFP? -Anemiathat will reviewed - 4/30transfused 2 units PRBC  Sepsis -Ruled out - Blood cultures NGTD - Urine culture negative  Essential HTN - 4/29 amlodipine 5 mg daily, contto monitor blood pressure -Control with HD  HLD -LDL within ADA guidelines -Hold Zocor  Diabetes type 2 uncontrolled with complication - 6/28 hemoglobin A1c= 8.3 -4/30 increase Lantus 15 units daily, continue to monitor blood glucose - Sensitive SSI  Thrombocytopenia: -Low but stableno evidence of bleeding  DVT prophylaxis: Heparin SQ  Code Status: Full    Code Status Orders  (From admission, onward)         Start     Ordered   05/19/18 1446  Full code  Continuous     05/19/18 1447        Code Status History    Date Active Date Inactive Code Status Order ID Comments User Context   04/21/2018 2247 05/05/2018 1938 Full Code [id number]  Garrel Ridgel, MD Inpatient   04/07/2018 1858 04/14/2018 1642 Full Code [id number]  Kary Kos,  Ottis Stain, NP Inpatient    Advance Directive Documentation     Most Recent Value  Type of Advance Directive  Healthcare Power of Attorney  Pre-existing out of facility DNR order (yellow form or pink  MOST form)  -  "MOST" Form in Place?  -     Family Communication: None today  Disposition Plan: Patient will require continued inpatient treatment for subsepcialty evaluation, dialysis, frequent nursing care, electrolyte monitoring, and fluid management. Possible TDC placement tomorrow. Without these treatments, pt at risk of volume overload, respiratory and cardiac arrests.  Anticipate return to SNF with HD as outpt   Consults called: None Admission status: Inpatient   Consultants:   nephrology  Procedures:  Ct Abdomen Pelvis Wo Contrast  Result Date: 05/19/2018 CLINICAL DATA:  Increased creatinine and ammonia levels. Altered mental status. Slurred speech. EXAM: CT ABDOMEN AND PELVIS WITHOUT CONTRAST TECHNIQUE: Multidetector CT imaging of the abdomen and pelvis was performed following the standard protocol without IV contrast. COMPARISON:  07/09/2017 FINDINGS: Lower chest: Dependent atelectasis. Mild coronary artery calcification. Hepatobiliary: Postcholecystectomy. Unremarkable appearance of the liver. Pancreas: There is fatty stranding in an about the length of the pancreas. Scattered pancreatic parenchymal calcifications are present suggesting chronic pancreatitis. No obvious mass or ductal dilatation. Spleen: Unremarkable Adrenals/Urinary Tract: Prominent perinephric stranding bilaterally is similar to that seen on the prior study. Small punctate calculi throughout the collecting system of the left kidney. 1.4 cm calculus in the right renal pelvis. Adrenal glands are within normal limits. Foley catheter decompresses the bladder. Stomach/Bowel: No disproportionate dilatation of bowel. No obvious mass in the colon. Unremarkable stomach. Vascular/Lymphatic: Minimal atherosclerotic vascular calcifications. Several subcentimeter short axis diameter para-aortic lymph nodes. Reproductive: Unremarkable prostate. Other: There is no free fluid. There is stranding in the fat along the paracolic gutters  and in the presacral space. There is also stranding in the subcutaneous fat. Musculoskeletal: No vertebral compression deformity. IMPRESSION: There is stranding within the fat in an about the pancreas suggesting an inflammatory process. Correlation with amylase and lipase is recommended. Pancreatic calcifications are compatible with chronic pancreatitis. There is stable prominent bilateral perinephric stranding. These findings may represent a recurrent inflammatory process of the kidneys. Bilateral nephrolithiasis. Stranding within the retroperitoneal fat suggesting anasarca. Electronically Signed   By: Marybelle Killings M.D.   On: 05/19/2018 15:41   Dg Chest Port 1 View  Result Date: 05/21/2018 CLINICAL DATA:  Abnormal respiration. EXAM: PORTABLE CHEST 1 VIEW COMPARISON:  Radiograph of May 19, 2018. FINDINGS: Stable cardiomegaly. Right internal jugular catheter is unchanged in position. No pneumothorax is noted. Left lung base is not included in field-of-view. No definite pleural effusion is seen within the visualized lung fields. Bony thorax is unremarkable. No consolidative process is noted. IMPRESSION: Stable cardiomegaly.  No acute pulmonary abnormality seen. Electronically Signed   By: Marijo Conception M.D.   On: 05/21/2018 07:50   Dg Chest Port 1 View  Result Date: 05/19/2018 CLINICAL DATA:  Bedside central venous catheter placement. EXAM: PORTABLE CHEST 1 VIEW 8:55 p.m.: COMPARISON:  Portable chest x-ray earlier today at 10:03 a.m. and previously. FINDINGS: RIGHT jugular central venous catheter tip projects over the UPPER SVC. No evidence of pneumothorax or mediastinal hematoma. Near expiratory image which accounts for the bibasilar atelectasis. New mild perihilar airspace pulmonary edema since the examination earlier this morning. IMPRESSION: 1. RIGHT jugular central venous catheter tip projects over the SVC. No acute complicating features. 2. New mild perihilar airspace pulmonary edema since the  examination earlier  this morning. Electronically Signed   By: Evangeline Dakin M.D.   On: 05/19/2018 21:06   Dg Chest Port 1 View  Result Date: 05/19/2018 CLINICAL DATA:  Increased creatinine and ammonia levels. Surge speech. EXAM: PORTABLE CHEST 1 VIEW COMPARISON:  May 16, 2018 FINDINGS: Stable cardiomegaly. Mild pulmonary venous congestion without overt edema. No focal infiltrate. No pneumothorax. IMPRESSION: Cardiomegaly and pulmonary venous congestion. Electronically Signed   By: Dorise Bullion III M.D   On: 05/19/2018 10:34     Antimicrobials:   none   Subjective: Patient did well overnight without complaint.  Resting in bed comfortably.   Objective: Vitals:   05/27/18 0807 05/27/18 0810 05/27/18 1136 05/27/18 1138  BP: 133/69   127/75  Pulse: 66  67 61  Resp: 11     Temp:  98.7 F (37.1 C)    TempSrc:  Oral    SpO2: 97%  100% 100%  Weight:      Height:        Intake/Output Summary (Last 24 hours) at 05/27/2018 1227 Last data filed at 05/27/2018 1135 Gross per 24 hour  Intake 742 ml  Output 300 ml  Net 442 ml   Filed Weights   05/25/18 0555 05/26/18 0404 05/27/18 0400  Weight: 120.5 kg 118.5 kg 119.7 kg    Examination:  General exam: Appears calm and comfortable  Respiratory system: Clear to auscultation. Respiratory effort normal. Cardiovascular system: S1 & S2 heard, RRR. No JVD, murmurs, rubs, gallops or clicks. No pedal edema. Gastrointestinal system: Abdomen is nondistended, soft and nontender. No organomegaly or masses felt. Normal bowel sounds heard. Central nervous system: Alert and oriented. No focal neurological deficits. Extremities: WWP, 1+PE Skin: No rashes, lesions or ulcers Psychiatry: Judgement and insight appear normal. Mood & affect appropriate.     Data Reviewed: I have personally reviewed following labs and imaging studies  CBC: Recent Labs  Lab 05/21/18 0419 05/22/18 0522 05/22/18 1230 05/24/18 0527 05/25/18 0500 05/26/18 0731   WBC 3.2* 3.8*  --  4.0 5.4 6.4  NEUTROABS 2.2  --   --   --   --   --   HGB 7.1* 6.3* 7.1* 6.7* 7.8* 8.4*  HCT 22.1* 19.7* 21.4* 21.1* 23.0* 25.0*  MCV 92.9 92.9  --  94.2 89.5 90.9  PLT 53* 60*  --  54* 64* 76*   Basic Metabolic Panel: Recent Labs  Lab 05/22/18 0522  05/23/18 0613  05/24/18 0527 05/24/18 1720 05/25/18 0500 05/25/18 1522 05/26/18 0731 05/26/18 1554  NA 136   < > 135   < > 135 137 137 137 138 139  K 3.8   < > 3.8   < > 4.0 2.8* 2.7* 3.1* 3.3* 3.2*  CL 107   < > 107   < > 109 102 106 106 110 109  CO2 19*   < > 18*   < > 17* 24 23 21* 20* 20*  GLUCOSE 140*   < > 174*   < > 198* 155* 123* 271* 139* 132*  BUN 27*   < > 39*   < > 45* 17 20 21* 22* 24*  CREATININE 4.28*   < > 5.47*   < > 6.17* 3.01* 3.84* 4.21* 5.02* 5.34*  CALCIUM 7.0*   < > 7.4*   < > 7.6* 7.8* 7.6* 7.7* 7.7* 7.8*  MG 2.5*  --  2.6*  --  2.7*  --  2.3  --  2.3  --   PHOS 5.0*   < >  5.9*   < > 7.8* 3.5 4.1 3.9 5.7* 6.0*   < > = values in this interval not displayed.   GFR: Estimated Creatinine Clearance: 23.5 mL/min (A) (by C-G formula based on SCr of 5.34 mg/dL (H)). Liver Function Tests: Recent Labs  Lab 05/21/18 0419  05/24/18 1720 05/25/18 0500 05/25/18 1522 05/26/18 0731 05/26/18 1554  AST 13*  --   --   --   --   --   --   ALT 15  --   --   --   --   --   --   ALKPHOS 123  --   --   --   --   --   --   BILITOT 0.5  --   --   --   --   --   --   PROT 5.6*  --   --   --   --   --   --   ALBUMIN 2.4*   < > 2.4* 2.4* 2.6* 2.4* 2.4*   < > = values in this interval not displayed.   No results for input(s): LIPASE, AMYLASE in the last 168 hours. Recent Labs  Lab 05/21/18 0415 05/24/18 0523 05/25/18 0500 05/26/18 0731  AMMONIA 37* 33 29 52*   Coagulation Profile: Recent Labs  Lab 05/21/18 0419  INR 1.7*   Cardiac Enzymes: No results for input(s): CKTOTAL, CKMB, CKMBINDEX, TROPONINI in the last 168 hours. BNP (last 3 results) No results for input(s): PROBNP in the last 8760  hours. HbA1C: No results for input(s): HGBA1C in the last 72 hours. CBG: Recent Labs  Lab 05/26/18 2106 05/26/18 2353 05/27/18 0405 05/27/18 0805 05/27/18 1134  GLUCAP 147* 140* 174* 135* 139*   Lipid Profile: No results for input(s): CHOL, HDL, LDLCALC, TRIG, CHOLHDL, LDLDIRECT in the last 72 hours. Thyroid Function Tests: No results for input(s): TSH, T4TOTAL, FREET4, T3FREE, THYROIDAB in the last 72 hours. Anemia Panel: No results for input(s): VITAMINB12, FOLATE, FERRITIN, TIBC, IRON, RETICCTPCT in the last 72 hours. Sepsis Labs: No results for input(s): PROCALCITON, LATICACIDVEN in the last 168 hours.  Recent Results (from the past 240 hour(s))  Blood Culture (routine x 2)     Status: None   Collection Time: 05/19/18  9:34 AM  Result Value Ref Range Status   Specimen Description BLOOD LEFT ANTECUBITAL  Final   Special Requests   Final    BOTTLES DRAWN AEROBIC AND ANAEROBIC Blood Culture adequate volume   Culture   Final    NO GROWTH 5 DAYS Performed at [facility], [address], [place], [place] [address]    Report Status 05/24/2018 FINAL  Final  Urine culture     Status: None   Collection Time: 05/19/18  9:41 AM  Result Value Ref Range Status   Specimen Description URINE, RANDOM  Final   Special Requests NONE  Final   Culture   Final    NO GROWTH Performed at [facility], [place] [address] [place], [place] [address]    Report Status 05/20/2018 FINAL  Final  SARS Coronavirus 2 [facility] order, Performed in [place] hospital lab)     Status: None   Collection Time: 05/19/18  9:44 AM  Result Value Ref Range Status   SARS Coronavirus 2 NEGATIVE NEGATIVE Final    Comment: (NOTE) If result is NEGATIVE SARS-CoV-2 target nucleic acids are NOT DETECTED. The SARS-CoV-2 RNA is generally detectable in upper and lower  respiratory  specimens during the acute phase of infection. The lowest  concentration of SARS-CoV-2 viral copies this assay can detect is  250  copies / mL. A negative result does not preclude SARS-CoV-2 infection  and should not be used as the sole basis for treatment or other  patient management decisions.  A negative result may occur with  improper specimen collection / handling, submission of specimen other  than nasopharyngeal swab, presence of viral mutation(s) within the  areas targeted by this assay, and inadequate number of viral copies  (<250 copies / mL). A negative result must be combined with clinical  observations, patient history, and epidemiological information. If result is POSITIVE SARS-CoV-2 target nucleic acids are DETECTED. The SARS-CoV-2 RNA is generally detectable in upper and lower  respiratory specimens dur ing the acute phase of infection.  Positive  results are indicative of active infection with SARS-CoV-2.  Clinical  correlation with patient history and other diagnostic information is  necessary to determine patient infection status.  Positive results do  not rule out bacterial infection or co-infection with other viruses. If result is PRESUMPTIVE POSTIVE SARS-CoV-2 nucleic acids MAY BE PRESENT.   A presumptive positive result was obtained on the submitted specimen  and confirmed on repeat testing.  While 2019 novel coronavirus  (SARS-CoV-2) nucleic acids may be present in the submitted sample  additional confirmatory testing may be necessary for epidemiological  and / or clinical management purposes  to differentiate between  SARS-CoV-2 and other Sarbecovirus currently known to infect humans.  If clinically indicated additional testing with an alternate test  methodology [phone]) is advised. The SARS-CoV-2 RNA is generally  detectable in upper and lower respiratory sp ecimens during the acute  phase of infection. The expected result is Negative. Fact Sheet for Patients:  StrictlyIdeas.no Fact Sheet for Healthcare Providers:  BankingDealers.co.za This test is not yet approved or cleared by the [place] FDA and has been authorized for detection and/or diagnosis of SARS-CoV-2 by FDA under an Emergency Use Authorization (EUA).  This EUA will remain in effect (meaning this test can be used) for the duration of the COVID-19 declaration under Section 564(b)(1) of the Act, 21 U.S.C. section 360bbb-3(b)(1), unless the authorization is terminated or revoked sooner. Performed at [facility], [place] [address], [place], [place] [address]   MRSA PCR Screening     Status: None   Collection Time: 05/19/18 10:15 AM  Result Value Ref Range Status   MRSA by PCR NEGATIVE NEGATIVE Final    Comment:        The GeneXpert MRSA Assay (FDA approved for NASAL specimens only), is one component of a comprehensive MRSA colonization surveillance program. It is not intended to diagnose MRSA infection nor to guide or monitor treatment for MRSA infections. Performed at [facility], [place] [address], [place], [place] [address]   Blood Culture (routine x 2)     Status: None   Collection Time: 05/19/18  4:27 PM  Result Value Ref Range Status   Specimen Description BLOOD RIGHT HAND  Final   Special Requests AEROBIC BOTTLE ONLY Blood Culture adequate volume  Final   Culture   Final    NO GROWTH 5 DAYS Performed at [facility], [place] [address] [place], [place] [address]    Report Status 05/24/2018 FINAL  Final         Radiology Studies: No results found.      Scheduled Meds: . sodium chloride   Intravenous  Once  . amLODipine  5 mg Oral Daily  . carbamazepine  700 mg Oral BID  . Chlorhexidine Gluconate Cloth  6 each Topical Q0600  . darbepoetin (ARANESP) injection - NON-DIALYSIS  100 mcg Subcutaneous Once  . ferrous sulfate  325 mg Oral Q breakfast  . heparin injection (subcutaneous)  5,000 Units Subcutaneous Q8H  . insulin aspart  0-5 Units Subcutaneous QHS  . insulin aspart   0-9 Units Subcutaneous Q4H  . insulin glargine  15 Units Subcutaneous QHS  . mouth rinse  15 mL Mouth Rinse BID  . PHENobarbital  243 mg Oral QHS  . simvastatin  20 mg Oral QHS  . sodium chloride flush  10 mL Intravenous Q12H  . tamsulosin  0.4 mg Oral QHS   Continuous Infusions:   LOS: 8 days    Time spent: [place], MD [facility]  If 7PM-7AM, please contact night-coverage  05/27/2018, 12:27 PM

## 2018-05-27 NOTE — Progress Notes (Signed)
Admit: 05/19/2018 LOS: 8  52M Cirrhosis, CKD (Labile SCr, perhaps SCr as low as 1.9) admitted with AMS and AKI.  Recent [facility] admit for pneumonia / VDRF  Subjective:  . No new issues . 0.2L UOP yesterday . No labs this AM, ordered . Has right IJ temp HD cath, no bleeding  05/02 0701 - 05/03 0700 In: 1040 [P.O.:1040] Out: 200 [Urine:200]  Filed Weights   05/25/18 0555 05/26/18 0404 05/27/18 0400  Weight: 120.5 kg 118.5 kg 119.7 kg    Scheduled Meds: . sodium chloride   Intravenous Once  . amLODipine  5 mg Oral Daily  . carbamazepine  700 mg Oral BID  . Chlorhexidine Gluconate Cloth  6 each Topical Q0600  . darbepoetin (ARANESP) injection - NON-DIALYSIS  100 mcg Subcutaneous Once  . ferrous sulfate  325 mg Oral Q breakfast  . heparin injection (subcutaneous)  5,000 Units Subcutaneous Q8H  . insulin aspart  0-5 Units Subcutaneous QHS  . insulin aspart  0-9 Units Subcutaneous Q4H  . insulin glargine  15 Units Subcutaneous QHS  . mouth rinse  15 mL Mouth Rinse BID  . PHENobarbital  243 mg Oral QHS  . simvastatin  20 mg Oral QHS  . sodium chloride flush  10 mL Intravenous Q12H  . tamsulosin  0.4 mg Oral QHS   Continuous Infusions: PRN Meds:.heparin, ondansetron (ZOFRAN) IV, sodium chloride flush  Current Labs: reviewed  Results for Eddie Davies, Eddie Davies (MRN [id number]) as of 05/26/2018 11:52  Ref. Range 05/24/2018 12:01  Saturation Ratios Latest Ref Range: 17.9 - 39.5 % 36  Ferritin Latest Ref Range: 24 - 336 ng/mL 174    Physical Exam:  Blood pressure 127/75, pulse 61, temperature 98.7 F (37.1 C), temperature source Oral, resp. rate 11, height 6' 2"  (1.88 m), weight 119.7 kg, SpO2 100 %. Obese, NAD RRR nl s1s2 CTAB Ichythosis present No sig LEE S/nt/nd Nonfocal Foley cath in place  A 1. Dialysis dependent AKI, labile baseline CKD; CT A/P at admit with b/l nonobstructive nephrolithiasis; probably ATN 2. Cirrhosis 3. Morbid obesity 4. Long term SNF resident  5. B/l  nonobstructive nephrolithiasis 6. Anemia TSAT 36% on ESA 7. Hx/o seizures 8. AMS resolved 9. Metabolic acidosis, stable 10. Temp R IJ Cath present  P . Remains HD dep't; f/u labs over weekend . If still needs HD tomorrow would place [facility], can clip as AKI on MWF to [place] . NPO p MN in case needs TDC . Medication Issues; o Preferred narcotic agents for pain control are hydromorphone, fentanyl, and methadone. Morphine should not be used.  o Baclofen should be avoided o Avoid oral sodium phosphate and magnesium citrate based laxatives / bowel preps    Pearson Grippe MD 05/27/2018, 11:51 AM  Recent Labs  Lab 05/25/18 1522 05/26/18 0731 05/26/18 1554  NA 137 138 139  K 3.1* 3.3* 3.2*  CL 106 110 109  CO2 21* 20* 20*  GLUCOSE 271* 139* 132*  BUN 21* 22* 24*  CREATININE 4.21* 5.02* 5.34*  CALCIUM 7.7* 7.7* 7.8*  PHOS 3.9 5.7* 6.0*   Recent Labs  Lab 05/21/18 0419  05/24/18 0527 05/25/18 0500 05/26/18 0731  WBC 3.2*   < > 4.0 5.4 6.4  NEUTROABS 2.2  --   --   --   --   HGB 7.1*   < > 6.7* 7.8* 8.4*  HCT 22.1*   < > 21.1* 23.0* 25.0*  MCV 92.9   < > 94.2 89.5 90.9  PLT 53*   < >  54* 64* 76*   < > = values in this interval not displayed.

## 2018-05-28 LAB — BASIC METABOLIC PANEL
Anion gap: 8 (ref 5–15)
BUN: 33 mg/dL — ABNORMAL HIGH (ref 6–20)
CO2: 17 mmol/L — ABNORMAL LOW (ref 22–32)
Calcium: 7.5 mg/dL — ABNORMAL LOW (ref 8.9–10.3)
Chloride: 113 mmol/L — ABNORMAL HIGH (ref 98–111)
Creatinine, Ser: 6.98 mg/dL — ABNORMAL HIGH (ref 0.61–1.24)
GFR calc Af Amer: 10 mL/min — ABNORMAL LOW (ref >60)
GFR calc non Af Amer: 9 mL/min — ABNORMAL LOW (ref >60)
Glucose, Bld: 105 mg/dL — ABNORMAL HIGH (ref 70–99)
Potassium: 4.1 mmol/L (ref 3.5–5.1)
Sodium: 138 mmol/L (ref 135–145)

## 2018-05-28 LAB — CBC
HCT: 24.2 % — ABNORMAL LOW (ref 39.0–52.0)
HCT: 25.6 % — ABNORMAL LOW (ref 39.0–52.0)
Hemoglobin: 7.6 g/dL — ABNORMAL LOW (ref 13.0–17.0)
Hemoglobin: 8.3 g/dL — ABNORMAL LOW (ref 13.0–17.0)
MCH: 29.9 pg (ref 26.0–34.0)
MCH: 30.4 pg (ref 26.0–34.0)
MCHC: 31.4 g/dL (ref 30.0–36.0)
MCHC: 32.4 g/dL (ref 30.0–36.0)
MCV: 95.3 fL (ref 80.0–100.0)
MCV: 93.8 fL (ref 80.0–100.0)
Platelets: 84 10*3/uL — ABNORMAL LOW (ref 150–400)
Platelets: 85 10*3/uL — ABNORMAL LOW (ref 150–400)
RBC: 2.54 MIL/uL — ABNORMAL LOW (ref 4.22–5.81)
RBC: 2.73 MIL/uL — ABNORMAL LOW (ref 4.22–5.81)
RDW: 15.9 % — ABNORMAL HIGH (ref 11.5–15.5)
RDW: 16.3 % — ABNORMAL HIGH (ref 11.5–15.5)
WBC: 6.2 10*3/uL (ref 4.0–10.5)
WBC: 6.2 10*3/uL (ref 4.0–10.5)
nRBC: 0 % (ref 0.0–0.2)
nRBC: 0 % (ref 0.0–0.2)

## 2018-05-28 LAB — RENAL FUNCTION PANEL
Albumin: 2.3 g/dL — ABNORMAL LOW (ref 3.5–5.0)
Albumin: 2.4 g/dL — ABNORMAL LOW (ref 3.5–5.0)
Anion gap: 8 (ref 5–15)
Anion gap: 10 (ref 5–15)
BUN: 34 mg/dL — ABNORMAL HIGH (ref 6–20)
BUN: 36 mg/dL — ABNORMAL HIGH (ref 6–20)
CO2: 18 mmol/L — ABNORMAL LOW (ref 22–32)
CO2: 17 mmol/L — ABNORMAL LOW (ref 22–32)
Calcium: 7.5 mg/dL — ABNORMAL LOW (ref 8.9–10.3)
Calcium: 7.8 mg/dL — ABNORMAL LOW (ref 8.9–10.3)
Chloride: 113 mmol/L — ABNORMAL HIGH (ref 98–111)
Chloride: 111 mmol/L (ref 98–111)
Creatinine, Ser: 6.97 mg/dL — ABNORMAL HIGH (ref 0.61–1.24)
Creatinine, Ser: 7.31 mg/dL — ABNORMAL HIGH (ref 0.61–1.24)
GFR calc Af Amer: 10 mL/min — ABNORMAL LOW (ref >60)
GFR calc Af Amer: 9 mL/min — ABNORMAL LOW (ref >60)
GFR calc non Af Amer: 9 mL/min — ABNORMAL LOW (ref >60)
GFR calc non Af Amer: 8 mL/min — ABNORMAL LOW (ref >60)
Glucose, Bld: 105 mg/dL — ABNORMAL HIGH (ref 70–99)
Glucose, Bld: 79 mg/dL (ref 70–99)
Phosphorus: 8.4 mg/dL — ABNORMAL HIGH (ref 2.5–4.6)
Phosphorus: 8.6 mg/dL — ABNORMAL HIGH (ref 2.5–4.6)
Potassium: 4.1 mmol/L (ref 3.5–5.1)
Potassium: 4 mmol/L (ref 3.5–5.1)
Sodium: 139 mmol/L (ref 135–145)
Sodium: 138 mmol/L (ref 135–145)

## 2018-05-28 LAB — GLUCOSE, CAPILLARY
Glucose-Capillary: 125 mg/dL — ABNORMAL HIGH (ref 70–99)
Glucose-Capillary: 82 mg/dL (ref 70–99)
Glucose-Capillary: 68 mg/dL — ABNORMAL LOW (ref 70–99)
Glucose-Capillary: 119 mg/dL — ABNORMAL HIGH (ref 70–99)
Glucose-Capillary: 112 mg/dL — ABNORMAL HIGH (ref 70–99)
Glucose-Capillary: 114 mg/dL — ABNORMAL HIGH (ref 70–99)

## 2018-05-28 LAB — CARBAMAZEPINE, FREE AND TOTAL
Carbamazepine, Free: 0.8 ug/mL (ref 0.6–4.2)
Carbamazepine, Total: <2 ug/mL — ABNORMAL LOW (ref 4.0–12.0)

## 2018-05-28 LAB — AMMONIA: Ammonia: 58 umol/L — ABNORMAL HIGH (ref 9–35)

## 2018-05-28 LAB — MAGNESIUM: Magnesium: 2.6 mg/dL — ABNORMAL HIGH (ref 1.7–2.4)

## 2018-05-28 MED ORDER — ALTEPLASE 2 MG IJ SOLR: 2.0000 mg | Freq: Once | INTRAMUSCULAR | Status: DC | PRN

## 2018-05-28 MED ORDER — SODIUM CHLORIDE 0.9 % IV SOLN: 100.0000 mL | INTRAVENOUS | Status: DC | PRN

## 2018-05-28 MED ORDER — LIDOCAINE HCL (PF) 1 % IJ SOLN: 5.0000 mL | INTRAMUSCULAR | Status: DC | PRN

## 2018-05-28 MED ORDER — HEPARIN SODIUM (PORCINE) 5000 UNIT/ML IJ SOLN: 5000.0000 [IU] | Freq: Three times a day (TID) | INTRAMUSCULAR | Status: DC

## 2018-05-28 MED ORDER — PENTAFLUOROPROP-TETRAFLUOROETH EX AERO: 1.0000 "application " | INHALATION_SPRAY | CUTANEOUS | Status: DC | PRN

## 2018-05-28 MED ORDER — PHENOBARBITAL 97.2 MG PO TABS
226.8000 mg | ORAL_TABLET | Freq: Every day | ORAL | Status: DC
  Administered 2018-05-29 – 2018-05-31 (×2): 243 mg via ORAL
  Filled 2018-05-28 (×2): qty 1

## 2018-05-28 MED ORDER — PHENOBARBITAL 97.2 MG PO TABS
307.8000 mg | ORAL_TABLET | Freq: Every day | ORAL | Status: AC
  Administered 2018-05-29: 307.8 mg via ORAL
  Filled 2018-05-28: qty 1

## 2018-05-28 MED ORDER — HEPARIN SODIUM (PORCINE) 1000 UNIT/ML DIALYSIS
1000.0000 [IU] | INTRAMUSCULAR | Status: DC | PRN
  Filled 2018-05-28: qty 1

## 2018-05-28 MED ORDER — HEPARIN SODIUM (PORCINE) 1000 UNIT/ML DIALYSIS
20.0000 [IU]/kg | INTRAMUSCULAR | Status: DC | PRN
  Filled 2018-05-28: qty 2

## 2018-05-28 MED ORDER — LIDOCAINE-PRILOCAINE 2.5-2.5 % EX CREA
1.0000 "application " | TOPICAL_CREAM | CUTANEOUS | Status: DC | PRN
  Filled 2018-05-28: qty 5

## 2018-05-28 MED ORDER — PHENOBARBITAL 97.2 MG PO TABS: 291.6000 mg | ORAL_TABLET | Freq: Every day | ORAL | Status: DC

## 2018-05-28 MED ORDER — CHLORHEXIDINE GLUCONATE CLOTH 2 % EX PADS: 6.0000 | MEDICATED_PAD | Freq: Every day | CUTANEOUS | Status: DC

## 2018-05-28 NOTE — Progress Notes (Signed)
Patient has been accepted at [facility] on MWF schedule with seat time of 6:45am. He will need to arrive at 6:00am for his initial treatment to sign paperwork. Given conversation with patient and SNF staff during Renal Navigator's assessment, Renal Navigator has requested that chair time be evaluated to see if there is any possibility for a later time for patient. However, patient does have a seat schedule when ready for discharge. Renal Navigator has not yet provided seat schedule to patient or SNF staff in the event that a later time can be arranged, as Renal Navigator knows that patient will be more pleased with a later time. Renal Navigator will follow up.  Eddie Davies Renal Navigator [phone]

## 2018-05-28 NOTE — Progress Notes (Signed)
Inpatient Diabetes Program Recommendations  AACE/ADA: New Consensus Statement on Inpatient Glycemic Control  Target Ranges:  Prepandial:   less than 140 mg/dL      Peak postprandial:   less than 180 mg/dL (1-2 hours)      Critically ill patients:  140 - 180 mg/dL   Results for JULEZ, HUSEBY (MRN [id number]) as of 05/28/2018 13:13  Ref. Range 05/27/2018 08:05 05/27/2018 11:34 05/27/2018 17:12 05/27/2018 19:57 05/27/2018 23:46 05/28/2018 04:20 05/28/2018 08:25 05/28/2018 12:17  Glucose-Capillary Latest Ref Range: 70 - 99 mg/dL 135 (H) Novolog 1 unit given 139 (H) Novolog 1 units given at1349 187 (H) Novolog 2 units given 161 (H) Novolog 2 units given at 2159 176 (H) 125 (H) Novolog 1 unit given 82 68 (L)   Review of Glycemic Control  Diabetes history: DM2 Outpatient Diabetes medications: Basaglar 17 units BID, Novolog 0-15 units TID with meals, Tradjenta 5 mg daily Current orders for Inpatient glycemic control:  Novolog 0-9 units Q4H  Inpatient Diabetes Program Recommendations:   Hypoglycemia 68 today for lunch possibly related to Q4 hour coverage in renal patient.  Correction (SSI): Please consider changing frequency of Novolog 0-9 units to TID with meals and add Novolog 0-5 units qhs for bedtime coverage.  Thanks, Tama Headings RN, MSN, BC-ADM  Inpatient Diabetes Coordinator Team Pager [phone] (8a-5p)

## 2018-05-28 NOTE — Progress Notes (Addendum)
Pharmacy note: phenobarbital  48 yo male with history of seizures on phenobarbital (On PTA). He is now on HD.  -he is on 252m po qhs -last level was 11 on 4/29  Some resources suggest giving the dose prior to HD then 50% of the dose post HD. Removal of Phenobarbital is about 20-50% with HD  Plan -Spoke with Dr. SHarvie Junior will give 307.836mphenobarbital tonight (~ 25% additional dose) -Will need to consider a scheduled supplemental dosing when an HD schedule is established  AnHildred LaserPharmD Clinical Pharmacist **Pharmacist phone directory can now be found on [facility] (PW [email or web address]).  [place]ed under [facility]

## 2018-05-28 NOTE — Progress Notes (Signed)
[facility] NEPHROLOGY PROGRESS NOTE  Assessment/ Plan: Pt is a 48 y.o. yo male with pancreatitis, CKD with baseline serum creatinine level around 1.9, admitted with pneumonia, VDRF altered mental status and AKI.  #Acute kidney injury on CKD, oliguric, dialysis dependent, CT abdomen pelvis with no obstruction, probably ATN.  He received dialysis on 4/30.  Patient remains oliguric and serum creatinine level increased to 6.9.  Plan for dialysis today via temporary catheter.  IR consulted for the placement of TDC likely tomorrow, I will keep patient n.p.o. from midnight.  I have discussed with the patient. CLIP for acute.  #Anemia: Iron saturation 36%: Continue ESA.  Monitor hemoglobin  #Metabolic acidosis:  Dialysis today.  # Hyperphosphatemia: Check PTH level.  Monitor phosphorus.   #Bilateral nonobstructive nephrolithiasis, chronic pancreatitis:   Subjective: Seen and examined at bedside.  Patient is alert awake.  Denies nausea vomiting.  Discussed about requiring dialysis and plan for tunneled catheter. Objective Vital signs in last 24 hours: Vitals:   05/27/18 1955 05/27/18 2349 05/28/18 0417 05/28/18 0826  BP: 102/64 130/71 128/64 116/63  Pulse: 69 68 64 (!) 57  Resp:    12  Temp: 98.4 F (36.9 C) 98.1 F (36.7 C) 97.6 F (36.4 C) 97.9 F (36.6 C)  TempSrc: Oral Oral Oral Oral  SpO2: 99% 100% 100% 100%  Weight:      Height:       Weight change:   Intake/Output Summary (Last 24 hours) at 05/28/2018 1039 Last data filed at 05/27/2018 2203 Gross per 24 hour  Intake 360 ml  Output 100 ml  Net 260 ml       Labs: Basic Metabolic Panel: Recent Labs  Lab 05/26/18 1554 05/27/18 1244 05/28/18 0607  NA 139 136 138  139  K 3.2* 3.8 4.1  4.1  CL 109 109 113*  113*  CO2 20* 20* 17*  18*  GLUCOSE 132* 125* 105*  105*  BUN 24* 29* 33*  34*  CREATININE 5.34* 6.36* 6.98*  6.97*  CALCIUM 7.8* 7.8* 7.5*  7.5*  PHOS 6.0* 7.8* 8.4*   Liver Function  Tests: Recent Labs  Lab 05/26/18 1554 05/27/18 1244 05/28/18 0607  ALBUMIN 2.4* 2.3* 2.3*   No results for input(s): LIPASE, AMYLASE in the last 168 hours. Recent Labs  Lab 05/26/18 0731 05/27/18 1710 05/28/18 0626  AMMONIA 52* 47* 58*   CBC: Recent Labs  Lab 05/24/18 0527 05/25/18 0500 05/26/18 0731 05/27/18 1254 05/28/18 0607  WBC 4.0 5.4 6.4 6.1 6.2  HGB 6.7* 7.8* 8.4* 7.5* 7.6*  HCT 21.1* 23.0* 25.0* 23.7* 24.2*  MCV 94.2 89.5 90.9 93.7 95.3  PLT 54* 64* 76* 72* 84*   Cardiac Enzymes: No results for input(s): CKTOTAL, CKMB, CKMBINDEX, TROPONINI in the last 168 hours. CBG: Recent Labs  Lab 05/27/18 1712 05/27/18 1957 05/27/18 2346 05/28/18 0420 05/28/18 0825  GLUCAP 187* 161* 176* 125* 82    Iron Studies: No results for input(s): IRON, TIBC, TRANSFERRIN, FERRITIN in the last 72 hours. Studies/Results: No results found.  Medications: Infusions:   Scheduled Medications: . sodium chloride   Intravenous Once  . amLODipine  5 mg Oral Daily  . carbamazepine  700 mg Oral BID  . Chlorhexidine Gluconate Cloth  6 each Topical Q0600  . Chlorhexidine Gluconate Cloth  6 each Topical Q0600  . ferrous sulfate  325 mg Oral Q breakfast  . heparin injection (subcutaneous)  5,000 Units Subcutaneous Q8H  . insulin aspart  0-5 Units Subcutaneous QHS  .  insulin aspart  0-9 Units Subcutaneous Q4H  . insulin glargine  15 Units Subcutaneous QHS  . mouth rinse  15 mL Mouth Rinse BID  . PHENobarbital  243 mg Oral QHS  . simvastatin  20 mg Oral QHS  . sodium chloride flush  10 mL Intravenous Q12H  . tamsulosin  0.4 mg Oral QHS    have reviewed scheduled and prn medications.  Physical Exam: General:NAD, comfortable Heart:RRR, s1s2 nl, rubs Lungs:clear b/l, crackle Abdomen:soft, Non-tender, non-distended Extremities:No edema Dialysis Access: right IJ temp cath site clean.  Johnatan Baskette Prasad Demyan Fugate 05/28/2018,10:39 AM  LOS: 9 days

## 2018-05-28 NOTE — Progress Notes (Signed)
PROGRESS NOTE    Eddie Davies  NWG:[id number] DOB: [birth year] DOA: 05/19/2018 PCP: [facility]   Brief Narrative:  48 y.o.WM PMHxSeizures, CKD stage 3, presumed liver cirrhosis, HTN, hypertension,who presentedwith AMS. He washospitalized from 3/8-21 and 3/28-4/11 for acute metabolic encephalopathysecondary to septic shock from ESBL positive Klebsiella and E faecalis UTI with subsequent hypoxic respiratory failure in the setting of ESBL PNA, treated with meropenem and requiring mechanical ventilation and CRRT during hospitalizations.  ED Course:Encephalopathy, UTI, AKI. Complex patient, recently discharged. Worsening confusion, elevated creatinine and NH4 levels. Hypotensive on arrival, rectal temp 88, transiently hypoxic. Labs with UTI. Creatinine up to 8.11, BUN 64. Hgb is low but stable. CXR with vascular congestion, mild. Pharmacy suggested Cefepime and Vanc was reasonable. MRSA PCR is pending. Given mild IVF. BP 91/51. Has not given sepsis bolus. PCCM to evaluate to determine if admission to their service is needed and she will let me know.  Patient was admitted to PCCM with acute renal failure and metabolic acidosis. Received CRRT in ICU by renal. Renal function and acidosis improved. Transferred to [facility] 4/28   Assessment & Plan:   Active Problems:   Altered mental status, unspecified   Metabolic acidosis   Confusion   Acute metabolic encephalopathy   Seizures (HCC)   BPH (benign prostatic hyperplasia)   Sepsis (HCC)   Benign essential HTN   HLD (hyperlipidemia)   Diabetes mellitus type 2, uncontrolled, with complications (HCC)   Thrombocytopenia (HCC)   Acute on CKD stage III (baseline Cr ~1.8) -Cr peak= 8.34,now6.98 -Per nephrology most likely secondary to ATN -Continue HD per nephrology.session today -Strict in and outpos 500 -Daily weights - planned MWF - NPO after midnight, TDC tomorrow  Acute metabolic  encephalopathy -Resolved, answering questions appropriately  Hx seizures -No evidence of seizure activity. -Carbamazepine 700 mg twice daily -Carbamazepine level pending - Phenobarbital 243 mg nightly -Phenobarbital level=11 -Gabapentin on hold  Hx of Cirrhosis - Patient on rifaximin and lactulose at home, these were DC'd in setting of normal-appearing liver on CT by PCCM - Patient with low albumin, normal bili, low platelets mildly elevated INR would continue to monitor closely -Monitor ammonia level now that home medication discontinued, most recent 29 on May 1  BPH -Flomax -total output100 recorded forlast 24 hours  Anemia - 4/27transfused 1 unit PRBC - During hospitalization transfused 2 units PRBC, 1 unit FFP? -Anemiathat will reviewed - 4/30transfused 2 units PRBC  Sepsis -Ruled out - Blood cultures NGTD - Urine culture negative -- recheck COVID today for SNF placement  Essential HTN - 4/29 amlodipine 5 mg daily, contto monitor blood pressure -Control with HD  HLD -LDL within ADA guidelines -Hold Zocor  Diabetes type 2 uncontrolled with complication - 5/78 hemoglobin A1c= 8.3 -4/30 increase Lantus 15 units daily, continue to monitor blood glucose - Sensitive SSI  Thrombocytopenia: -Low but stableno evidence of bleeding  DVT prophylaxis: Heparin SQ  Code Status: FULL    Code Status Orders  (From admission, onward)         Start     Ordered   05/19/18 1446  Full code  Continuous     05/19/18 1447        Code Status History    Date Active Date Inactive Code Status Order ID Comments User Context   04/21/2018 2247 05/05/2018 1938 Full Code [id number]  Garrel Ridgel, MD Inpatient   04/07/2018 1858 04/14/2018 1642 Full Code [id number]  Erick Colace, NP Inpatient  Advance Directive Documentation     Most Recent Value  Type of Advance Directive  Healthcare Power of Attorney  Pre-existing out of facility DNR order (yellow  form or pink MOST form)  -  "MOST" Form in Place?  -     Family Communication: None today Disposition Plan:   Patient will require continued inpatient treatment forsubsepcialty evaluation,dialysis, frequent nursing care, electrolyte monitoring, and fluid management. TDC placement tomorrow. Without these treatments, pt at risk of volume overload, respiratory and cardiac arrests. Anticipate return to SNF with HD as outpt   Consults called: None Admission status: Inpatient   Consultants:   nephrology  Procedures:  Ct Abdomen Pelvis Wo Contrast  Result Date: 05/19/2018 CLINICAL DATA:  Increased creatinine and ammonia levels. Altered mental status. Slurred speech. EXAM: CT ABDOMEN AND PELVIS WITHOUT CONTRAST TECHNIQUE: Multidetector CT imaging of the abdomen and pelvis was performed following the standard protocol without IV contrast. COMPARISON:  07/09/2017 FINDINGS: Lower chest: Dependent atelectasis. Mild coronary artery calcification. Hepatobiliary: Postcholecystectomy. Unremarkable appearance of the liver. Pancreas: There is fatty stranding in an about the length of the pancreas. Scattered pancreatic parenchymal calcifications are present suggesting chronic pancreatitis. No obvious mass or ductal dilatation. Spleen: Unremarkable Adrenals/Urinary Tract: Prominent perinephric stranding bilaterally is similar to that seen on the prior study. Small punctate calculi throughout the collecting system of the left kidney. 1.4 cm calculus in the right renal pelvis. Adrenal glands are within normal limits. Foley catheter decompresses the bladder. Stomach/Bowel: No disproportionate dilatation of bowel. No obvious mass in the colon. Unremarkable stomach. Vascular/Lymphatic: Minimal atherosclerotic vascular calcifications. Several subcentimeter short axis diameter para-aortic lymph nodes. Reproductive: Unremarkable prostate. Other: There is no free fluid. There is stranding in the fat along the paracolic  gutters and in the presacral space. There is also stranding in the subcutaneous fat. Musculoskeletal: No vertebral compression deformity. IMPRESSION: There is stranding within the fat in an about the pancreas suggesting an inflammatory process. Correlation with amylase and lipase is recommended. Pancreatic calcifications are compatible with chronic pancreatitis. There is stable prominent bilateral perinephric stranding. These findings may represent a recurrent inflammatory process of the kidneys. Bilateral nephrolithiasis. Stranding within the retroperitoneal fat suggesting anasarca. Electronically Signed   By: Marybelle Killings M.D.   On: 05/19/2018 15:41   Dg Chest Port 1 View  Result Date: 05/21/2018 CLINICAL DATA:  Abnormal respiration. EXAM: PORTABLE CHEST 1 VIEW COMPARISON:  Radiograph of May 19, 2018. FINDINGS: Stable cardiomegaly. Right internal jugular catheter is unchanged in position. No pneumothorax is noted. Left lung base is not included in field-of-view. No definite pleural effusion is seen within the visualized lung fields. Bony thorax is unremarkable. No consolidative process is noted. IMPRESSION: Stable cardiomegaly.  No acute pulmonary abnormality seen. Electronically Signed   By: Marijo Conception M.D.   On: 05/21/2018 07:50   Dg Chest Port 1 View  Result Date: 05/19/2018 CLINICAL DATA:  Bedside central venous catheter placement. EXAM: PORTABLE CHEST 1 VIEW 8:55 p.m.: COMPARISON:  Portable chest x-ray earlier today at 10:03 a.m. and previously. FINDINGS: RIGHT jugular central venous catheter tip projects over the UPPER SVC. No evidence of pneumothorax or mediastinal hematoma. Near expiratory image which accounts for the bibasilar atelectasis. New mild perihilar airspace pulmonary edema since the examination earlier this morning. IMPRESSION: 1. RIGHT jugular central venous catheter tip projects over the SVC. No acute complicating features. 2. New mild perihilar airspace pulmonary edema since  the examination earlier this morning. Electronically Signed   By: Evangeline Dakin  M.D.   On: 05/19/2018 21:06   Dg Chest Port 1 View  Result Date: 05/19/2018 CLINICAL DATA:  Increased creatinine and ammonia levels. Surge speech. EXAM: PORTABLE CHEST 1 VIEW COMPARISON:  May 16, 2018 FINDINGS: Stable cardiomegaly. Mild pulmonary venous congestion without overt edema. No focal infiltrate. No pneumothorax. IMPRESSION: Cardiomegaly and pulmonary venous congestion. Electronically Signed   By: Dorise Bullion III M.D   On: 05/19/2018 10:34     Antimicrobials:   none    Subjective: Patient resting comfortably in bed today. Respiratory compromise, awaiting dialysis session,  Objective: Vitals:   05/27/18 2349 05/28/18 0417 05/28/18 0826 05/28/18 1100  BP: 130/71 128/64 116/63 (!) 122/91  Pulse: 68 64 (!) 57 71  Resp:   12 13  Temp: 98.1 F (36.7 C) 97.6 F (36.4 C) 97.9 F (36.6 C) 97.8 F (36.6 C)  TempSrc: Oral Oral Oral Oral  SpO2: 100% 100% 100% 100%  Weight:      Height:        Intake/Output Summary (Last 24 hours) at 05/28/2018 1245 Last data filed at 05/27/2018 2203 Gross per 24 hour  Intake 360 ml  Output -  Net 360 ml   Filed Weights   05/25/18 0555 05/26/18 0404 05/27/18 0400  Weight: 120.5 kg 118.5 kg 119.7 kg    Examination:  General exam: Appears calm and comfortable  Respiratory system: Clear to auscultation. Respiratory effort normal. Cardiovascular system: S1 & S2 heard, RRR. No JVD, murmurs, rubs, gallops or clicks. No pedal edema. Gastrointestinal system: Abdomen is nondistended, soft and nontender. Normal bowel sounds heard. Central nervous system: Alert and oriented. No focal neurological deficits. Extremities: WWP, no edema Skin: excema rash chronic, no draining or open lesions Psychiatry: Judgement and insight appear normal. Mood & affect appropriate.     Data Reviewed: I have personally reviewed following labs and imaging studies  CBC: Recent  Labs  Lab 05/25/18 0500 05/26/18 0731 05/27/18 1254 05/28/18 0607 05/28/18 1210  WBC 5.4 6.4 6.1 6.2 6.2  HGB 7.8* 8.4* 7.5* 7.6* 8.3*  HCT 23.0* 25.0* 23.7* 24.2* 25.6*  MCV 89.5 90.9 93.7 95.3 93.8  PLT 64* 76* 72* 84* 85*   Basic Metabolic Panel: Recent Labs  Lab 05/24/18 0527  05/25/18 0500 05/25/18 1522 05/26/18 0731 05/26/18 1554 05/27/18 1244 05/28/18 0607  NA 135   < > 137 137 138 139 136 138  139  K 4.0   < > 2.7* 3.1* 3.3* 3.2* 3.8 4.1  4.1  CL 109   < > 106 106 110 109 109 113*  113*  CO2 17*   < > 23 21* 20* 20* 20* 17*  18*  GLUCOSE 198*   < > 123* 271* 139* 132* 125* 105*  105*  BUN 45*   < > 20 21* 22* 24* 29* 33*  34*  CREATININE 6.17*   < > 3.84* 4.21* 5.02* 5.34* 6.36* 6.98*  6.97*  CALCIUM 7.6*   < > 7.6* 7.7* 7.7* 7.8* 7.8* 7.5*  7.5*  MG 2.7*  --  2.3  --  2.3  --  2.4 2.6*  PHOS 7.8*   < > 4.1 3.9 5.7* 6.0* 7.8* 8.4*   < > = values in this interval not displayed.   GFR: Estimated Creatinine Clearance: 18 mL/min (A) (by C-G formula based on SCr of 6.98 mg/dL (H)). Liver Function Tests: Recent Labs  Lab 05/25/18 1522 05/26/18 0731 05/26/18 1554 05/27/18 1244 05/28/18 0607  ALBUMIN 2.6* 2.4* 2.4* 2.3* 2.3*  No results for input(s): LIPASE, AMYLASE in the last 168 hours. Recent Labs  Lab 05/24/18 0523 05/25/18 0500 05/26/18 0731 05/27/18 1710 05/28/18 0626  AMMONIA 33 29 52* 47* 58*   Coagulation Profile: No results for input(s): INR, PROTIME in the last 168 hours. Cardiac Enzymes: No results for input(s): CKTOTAL, CKMB, CKMBINDEX, TROPONINI in the last 168 hours. BNP (last 3 results) No results for input(s): PROBNP in the last 8760 hours. HbA1C: No results for input(s): HGBA1C in the last 72 hours. CBG: Recent Labs  Lab 05/27/18 1957 05/27/18 2346 05/28/18 0420 05/28/18 0825 05/28/18 1217  GLUCAP 161* 176* 125* 82 68*   Lipid Profile: No results for input(s): CHOL, HDL, LDLCALC, TRIG, CHOLHDL, LDLDIRECT in the  last 72 hours. Thyroid Function Tests: No results for input(s): TSH, T4TOTAL, FREET4, T3FREE, THYROIDAB in the last 72 hours. Anemia Panel: No results for input(s): VITAMINB12, FOLATE, FERRITIN, TIBC, IRON, RETICCTPCT in the last 72 hours. Sepsis Labs: No results for input(s): PROCALCITON, LATICACIDVEN in the last 168 hours.  Recent Results (from the past 240 hour(s))  Blood Culture (routine x 2)     Status: None   Collection Time: 05/19/18  9:34 AM  Result Value Ref Range Status   Specimen Description BLOOD LEFT ANTECUBITAL  Final   Special Requests   Final    BOTTLES DRAWN AEROBIC AND ANAEROBIC Blood Culture adequate volume   Culture   Final    NO GROWTH 5 DAYS Performed at [facility], [address], [place], [place] [address]    Report Status 05/24/2018 FINAL  Final  Urine culture     Status: None   Collection Time: 05/19/18  9:41 AM  Result Value Ref Range Status   Specimen Description URINE, RANDOM  Final   Special Requests NONE  Final   Culture   Final    NO GROWTH Performed at [facility], [place] [address], [place], [place] [address]    Report Status 05/20/2018 FINAL  Final  SARS Coronavirus 2 [facility] order, Performed in [place] hospital lab)     Status: None   Collection Time: 05/19/18  9:44 AM  Result Value Ref Range Status   SARS Coronavirus 2 NEGATIVE NEGATIVE Final    Comment: (NOTE) If result is NEGATIVE SARS-CoV-2 target nucleic acids are NOT DETECTED. The SARS-CoV-2 RNA is generally detectable in upper and lower  respiratory specimens during the acute phase of infection. The lowest  concentration of SARS-CoV-2 viral copies this assay can detect is 250  copies / mL. A negative result does not preclude SARS-CoV-2 infection  and should not be used as the sole basis for treatment or other  patient management decisions.  A negative result may occur with  improper specimen collection / handling, submission of specimen other  than nasopharyngeal  swab, presence of viral mutation(s) within the  areas targeted by this assay, and inadequate number of viral copies  (<250 copies / mL). A negative result must be combined with clinical  observations, patient history, and epidemiological information. If result is POSITIVE SARS-CoV-2 target nucleic acids are DETECTED. The SARS-CoV-2 RNA is generally detectable in upper and lower  respiratory specimens dur ing the acute phase of infection.  Positive  results are indicative of active infection with SARS-CoV-2.  Clinical  correlation with patient history and other diagnostic information is  necessary to determine patient infection status.  Positive results do  not rule out bacterial infection or co-infection with other viruses. If result  is PRESUMPTIVE POSTIVE SARS-CoV-2 nucleic acids MAY BE PRESENT.   A presumptive positive result was obtained on the submitted specimen  and confirmed on repeat testing.  While 2019 novel coronavirus  (SARS-CoV-2) nucleic acids may be present in the submitted sample  additional confirmatory testing may be necessary for epidemiological  and / or clinical management purposes  to differentiate between  SARS-CoV-2 and other Sarbecovirus currently known to infect humans.  If clinically indicated additional testing with an alternate test  methodology [phone]) is advised. The SARS-CoV-2 RNA is generally  detectable in upper and lower respiratory sp ecimens during the acute  phase of infection. The expected result is Negative. Fact Sheet for Patients:  StrictlyIdeas.no Fact Sheet for Healthcare Providers: BankingDealers.co.za This test is not yet approved or cleared by the [place] FDA and has been authorized for detection and/or diagnosis of SARS-CoV-2 by FDA under an Emergency Use Authorization (EUA).  This EUA will remain in effect (meaning this test can be used) for the duration of the COVID-19 declaration  under Section 564(b)(1) of the Act, 21 U.S.C. section 360bbb-3(b)(1), unless the authorization is terminated or revoked sooner. Performed at [facility], [place] [address], [place], [place] [address]   MRSA PCR Screening     Status: None   Collection Time: 05/19/18 10:15 AM  Result Value Ref Range Status   MRSA by PCR NEGATIVE NEGATIVE Final    Comment:        The GeneXpert MRSA Assay (FDA approved for NASAL specimens only), is one component of a comprehensive MRSA colonization surveillance program. It is not intended to diagnose MRSA infection nor to guide or monitor treatment for MRSA infections. Performed at [facility], [place] [address] [place], [place] [address]   Blood Culture (routine x 2)     Status: None   Collection Time: 05/19/18  4:27 PM  Result Value Ref Range Status   Specimen Description BLOOD RIGHT HAND  Final   Special Requests AEROBIC BOTTLE ONLY Blood Culture adequate volume  Final   Culture   Final    NO GROWTH 5 DAYS Performed at [facility], [place] [address], [place], [place] [address]    Report Status 05/24/2018 FINAL  Final         Radiology Studies: No results found.      Scheduled Meds: . sodium chloride   Intravenous Once  . amLODipine  5 mg Oral Daily  . carbamazepine  700 mg Oral BID  . Chlorhexidine Gluconate Cloth  6 each Topical Q0600  . ferrous sulfate  325 mg Oral Q breakfast  . [START ON 05/30/2018] heparin injection (subcutaneous)  5,000 Units Subcutaneous Q8H  . insulin aspart  0-5 Units Subcutaneous QHS  . insulin aspart  0-9 Units Subcutaneous Q4H  . insulin glargine  15 Units Subcutaneous QHS  . mouth rinse  15 mL Mouth Rinse BID  . PHENobarbital  243 mg Oral QHS  . simvastatin  20 mg Oral QHS  . sodium chloride flush  10 mL Intravenous Q12H  . tamsulosin  0.4 mg Oral QHS   Continuous Infusions: . sodium chloride    . sodium chloride       LOS: 9 days    Time spent: 35 min    Nicolette Bang, MD [facility]  If 7PM-7AM, please contact night-coverage  05/28/2018, 12:45 PM

## 2018-05-29 ENCOUNTER — Telehealth: Payer: Self-pay

## 2018-05-29 LAB — RENAL FUNCTION PANEL
Albumin: 2.2 g/dL — ABNORMAL LOW (ref 3.5–5.0)
Anion gap: 10 (ref 5–15)
BUN: 18 mg/dL (ref 6–20)
CO2: 23 mmol/L (ref 22–32)
Calcium: 7.5 mg/dL — ABNORMAL LOW (ref 8.9–10.3)
Chloride: 104 mmol/L (ref 98–111)
Creatinine, Ser: 4.53 mg/dL — ABNORMAL HIGH (ref 0.61–1.24)
GFR calc Af Amer: 17 mL/min — ABNORMAL LOW (ref >60)
GFR calc non Af Amer: 14 mL/min — ABNORMAL LOW (ref >60)
Glucose, Bld: 147 mg/dL — ABNORMAL HIGH (ref 70–99)
Phosphorus: 4.3 mg/dL (ref 2.5–4.6)
Potassium: 3.3 mmol/L — ABNORMAL LOW (ref 3.5–5.1)
Sodium: 137 mmol/L (ref 135–145)

## 2018-05-29 LAB — CBC
HCT: 22.9 % — ABNORMAL LOW (ref 39.0–52.0)
Hemoglobin: 7.3 g/dL — ABNORMAL LOW (ref 13.0–17.0)
MCH: 29.9 pg (ref 26.0–34.0)
MCHC: 31.9 g/dL (ref 30.0–36.0)
MCV: 93.9 fL (ref 80.0–100.0)
Platelets: 98 10*3/uL — ABNORMAL LOW (ref 150–400)
RBC: 2.44 MIL/uL — ABNORMAL LOW (ref 4.22–5.81)
RDW: 16.1 % — ABNORMAL HIGH (ref 11.5–15.5)
WBC: 4.9 10*3/uL (ref 4.0–10.5)
nRBC: 0 % (ref 0.0–0.2)

## 2018-05-29 LAB — GLUCOSE, CAPILLARY
Glucose-Capillary: 121 mg/dL — ABNORMAL HIGH (ref 70–99)
Glucose-Capillary: 148 mg/dL — ABNORMAL HIGH (ref 70–99)
Glucose-Capillary: 140 mg/dL — ABNORMAL HIGH (ref 70–99)
Glucose-Capillary: 78 mg/dL (ref 70–99)
Glucose-Capillary: 101 mg/dL — ABNORMAL HIGH (ref 70–99)
Glucose-Capillary: 85 mg/dL (ref 70–99)

## 2018-05-29 LAB — PROTIME-INR
INR: 1.3 — ABNORMAL HIGH (ref 0.8–1.2)
Prothrombin Time: 16.1 seconds — ABNORMAL HIGH (ref 11.4–15.2)

## 2018-05-29 MED ORDER — HEPARIN SODIUM (PORCINE) 5000 UNIT/ML IJ SOLN: 5000.0000 [IU] | Freq: Three times a day (TID) | INTRAMUSCULAR | Status: DC

## 2018-05-29 MED ORDER — LACTULOSE 10 GM/15ML PO SOLN
10.0000 g | Freq: Two times a day (BID) | ORAL | Status: DC
  Administered 2018-05-29 – 2018-05-31 (×3): 10 g via ORAL
  Filled 2018-05-29 (×4): qty 15

## 2018-05-29 MED ORDER — HEPARIN SODIUM (PORCINE) 5000 UNIT/ML IJ SOLN
5000.0000 [IU] | Freq: Three times a day (TID) | INTRAMUSCULAR | Status: AC
  Administered 2018-05-29 (×2): 5000 [IU] via SUBCUTANEOUS
  Filled 2018-05-29 (×2): qty 1

## 2018-05-29 MED ORDER — CHLORHEXIDINE GLUCONATE CLOTH 2 % EX PADS
6.0000 | MEDICATED_PAD | Freq: Every day | CUTANEOUS | Status: DC
  Administered 2018-05-31: 6 via TOPICAL

## 2018-05-29 MED ORDER — INSULIN ASPART 100 UNIT/ML ~~LOC~~ SOLN
0.0000 [IU] | Freq: Three times a day (TID) | SUBCUTANEOUS | Status: DC
  Administered 2018-05-31: 1 [IU] via SUBCUTANEOUS

## 2018-05-29 MED ORDER — DARBEPOETIN ALFA 60 MCG/0.3ML IJ SOSY: 60.0000 ug | PREFILLED_SYRINGE | INTRAMUSCULAR | Status: DC

## 2018-05-29 MED ORDER — ACETAMINOPHEN 325 MG PO TABS: 650.0000 mg | ORAL_TABLET | Freq: Four times a day (QID) | ORAL | Status: DC | PRN

## 2018-05-29 NOTE — Telephone Encounter (Signed)
Today I placed a call to Mr. Eddie Davies to Pre consent him to the [employer] clinical trial. Approximately 10 minutes was spent explaining the details of the trial and what he could expect should he plan to participate in the study. After discussion he agreed to participate in the trial. I thanked him for his time and participation and informed him that someone from the [employer] research team would be there this evening to initiate the study procedures.  Carin Primrose

## 2018-05-29 NOTE — Progress Notes (Signed)
Renal Navigator spoke with [facility] HD clinic and secured a later seat time for patient. Patient's OP HD seat schedule is MWF at 11:30am. He must arrive at 11:00am on his first treatment day. Renal Navigator has notified CSW, Nephrologist, patient and SNF. Patient cleared for discharge from an OP HD standpoint.  [address] [place], [place] [address] [phone]  Alphonzo Cruise Renal Navigator [phone]

## 2018-05-29 NOTE — Progress Notes (Signed)
PROGRESS NOTE    Eddie Davies  TKP:[id number] DOB: 1970/06/01 DOA: 05/19/2018 PCP: [facility]   Brief Narrative:  48 y.o.WM PMHxSeizures, CKD stage 3, presumed liver cirrhosis, HTN, hypertension,who presentedwith AMS. He was hospitalized from March 8-21 and March 28 April 11 for acute metabolic encephalopathy secondary to septic shock from ESBL positive Klebsiella and E faecalis UTI with subsequent hypoxic respiratory failure in the setting of ESBL pneumonia treated with meropenem and requiring mechanical ventilation and CRRT during hospitalizations  ED Course:Encephalopathy, UTI, AKI. Complex patient, recently discharged. Worsening confusion, elevated creatinine and NH4 levels. Hypotensive on arrival, rectal temp 88, transiently hypoxic. Labs with UTI. Creatinine up to 8.11, BUN 64. Hgb is low but stable. CXR with vascular congestion, mild. Pharmacy suggested Cefepime and Vanc was reasonable. MRSA PCR is pending. Given mild IVF. BP 91/51. Has not given sepsis bolus. PCCM to evaluate to determine if admission to their service is needed and she will let me know.  Patient was admitted to PCCM with acute renal failure and metabolic acidosis. Received CRRT in ICU by renal. Renal function and acidosis improved. Transferred to [facility] 4/28, scheduled for dialysis catheter today anticipate dialysis tomorrow and likely discharge back to skilled nursing facility.   Assessment & Plan:   Active Problems:   Altered mental status, unspecified   Metabolic acidosis   Confusion   Acute metabolic encephalopathy   Seizures (HCC)   BPH (benign prostatic hyperplasia)   Sepsis (HCC)   Benign essential HTN   HLD (hyperlipidemia)   Diabetes mellitus type 2, uncontrolled, with complications (HCC)   Thrombocytopenia (HCC)   Acute on CKD stage III (baseline Cr ~1.8) -Cr peak= 8.34,continues requiring dialysis for management -Per nephrology most likely secondary to  ATN -Continue HD per nephrology.session today -Strict in and outpos 500 -Daily weights - planned MWFoutpatient hemodialysis through a skilled nursing facility has been arranged -Scheduled for [facility] today, anticipate dialysis with catheter tomorrow and likely discharge after  Acute metabolic encephalopathy -Resolved, answering questions appropriately  Hx seizures -No evidence of seizure activity. -Carbamazepine 700 mg twice daily -Carbamazepine level pending - Phenobarbital 243 mg nightly -Phenobarbital level=11 -Gabapentin on hold  Hx of Cirrhosis - Patient on rifaximin and lactulose at home, these were DC'd in setting of normal-appearing liver on CT by PCCM - Patient with low albumin, normal bili, low platelets mildly elevated INR would continue to monitor closely -Monitor ammonia level now that home medication discontinued, most recent 58 on May 4, added back in lactulose  BPH -Flomax -total output100recorded forlast 24 hours  Anemia - 4/27transfused 1 unit PRBC - During hospitalization transfused 2 units PRBC, 1 unit FFP? -Anemiathat will reviewed - 4/30transfused 2 units PRBC  Sepsis -Ruled out - Blood cultures NGTD - Urine culture negative -- recheck COVID today for SNF placement  Essential HTN - 4/29 amlodipine 5 mg daily, contto monitor blood pressure -Control with HD  HLD -LDL within ADA guidelines -Hold Zocor  Diabetes type 2 uncontrolled with complication - 5/17 hemoglobin A1c= 8.3 -4/30 increase Lantus 15 units daily, continue to monitor blood glucose - Sensitive SSI  Thrombocytopenia: -Low but stableno evidence of bleeding  DVT prophylaxis: Heparin SQ  Code Status: Full    Code Status Orders  (From admission, onward)         Start     Ordered   05/19/18 1446  Full code  Continuous     05/19/18 1447        Code Status History  Date Active Date Inactive Code Status Order ID Comments User Context    04/21/2018 2247 05/05/2018 1938 Full Code [id number]  Garrel Ridgel, MD Inpatient   04/07/2018 1858 04/14/2018 1642 Full Code [id number]  Erick Colace, NP Inpatient    Advance Directive Documentation     Most Recent Value  Type of Advance Directive  Healthcare Power of Attorney  Pre-existing out of facility DNR order (yellow form or pink MOST form)  -  "MOST" Form in Place?  -     Family Communication: Spoke with Mosie Lukes Disposition Plan:   Patient will require continued inpatient treatment forsubsepcialty evaluation,dialysis, frequent nursing care, electrolyte monitoring, and fluid management. and TDC placement. Without these treatments, ptat risk of volume overload,respiratory and cardiac arrests. Anticipate return to SNF with HD as outpt Consults called: None Admission status: Inpatient   Consultants:   nephrology, IR  Procedures:  Ct Abdomen Pelvis Wo Contrast  Result Date: 05/19/2018 CLINICAL DATA:  Increased creatinine and ammonia levels. Altered mental status. Slurred speech. EXAM: CT ABDOMEN AND PELVIS WITHOUT CONTRAST TECHNIQUE: Multidetector CT imaging of the abdomen and pelvis was performed following the standard protocol without IV contrast. COMPARISON:  07/09/2017 FINDINGS: Lower chest: Dependent atelectasis. Mild coronary artery calcification. Hepatobiliary: Postcholecystectomy. Unremarkable appearance of the liver. Pancreas: There is fatty stranding in an about the length of the pancreas. Scattered pancreatic parenchymal calcifications are present suggesting chronic pancreatitis. No obvious mass or ductal dilatation. Spleen: Unremarkable Adrenals/Urinary Tract: Prominent perinephric stranding bilaterally is similar to that seen on the prior study. Small punctate calculi throughout the collecting system of the left kidney. 1.4 cm calculus in the right renal pelvis. Adrenal glands are within normal limits. Foley catheter decompresses the bladder. Stomach/Bowel:  No disproportionate dilatation of bowel. No obvious mass in the colon. Unremarkable stomach. Vascular/Lymphatic: Minimal atherosclerotic vascular calcifications. Several subcentimeter short axis diameter para-aortic lymph nodes. Reproductive: Unremarkable prostate. Other: There is no free fluid. There is stranding in the fat along the paracolic gutters and in the presacral space. There is also stranding in the subcutaneous fat. Musculoskeletal: No vertebral compression deformity. IMPRESSION: There is stranding within the fat in an about the pancreas suggesting an inflammatory process. Correlation with amylase and lipase is recommended. Pancreatic calcifications are compatible with chronic pancreatitis. There is stable prominent bilateral perinephric stranding. These findings may represent a recurrent inflammatory process of the kidneys. Bilateral nephrolithiasis. Stranding within the retroperitoneal fat suggesting anasarca. Electronically Signed   By: Marybelle Killings M.D.   On: 05/19/2018 15:41   Dg Chest Port 1 View  Result Date: 05/21/2018 CLINICAL DATA:  Abnormal respiration. EXAM: PORTABLE CHEST 1 VIEW COMPARISON:  Radiograph of May 19, 2018. FINDINGS: Stable cardiomegaly. Right internal jugular catheter is unchanged in position. No pneumothorax is noted. Left lung base is not included in field-of-view. No definite pleural effusion is seen within the visualized lung fields. Bony thorax is unremarkable. No consolidative process is noted. IMPRESSION: Stable cardiomegaly.  No acute pulmonary abnormality seen. Electronically Signed   By: Marijo Conception M.D.   On: 05/21/2018 07:50   Dg Chest Port 1 View  Result Date: 05/19/2018 CLINICAL DATA:  Bedside central venous catheter placement. EXAM: PORTABLE CHEST 1 VIEW 8:55 p.m.: COMPARISON:  Portable chest x-ray earlier today at 10:03 a.m. and previously. FINDINGS: RIGHT jugular central venous catheter tip projects over the UPPER SVC. No evidence of pneumothorax  or mediastinal hematoma. Near expiratory image which accounts for the bibasilar atelectasis. New mild perihilar airspace pulmonary edema  since the examination earlier this morning. IMPRESSION: 1. RIGHT jugular central venous catheter tip projects over the SVC. No acute complicating features. 2. New mild perihilar airspace pulmonary edema since the examination earlier this morning. Electronically Signed   By: Evangeline Dakin M.D.   On: 05/19/2018 21:06   Dg Chest Port 1 View  Result Date: 05/19/2018 CLINICAL DATA:  Increased creatinine and ammonia levels. Surge speech. EXAM: PORTABLE CHEST 1 VIEW COMPARISON:  May 16, 2018 FINDINGS: Stable cardiomegaly. Mild pulmonary venous congestion without overt edema. No focal infiltrate. No pneumothorax. IMPRESSION: Cardiomegaly and pulmonary venous congestion. Electronically Signed   By: Dorise Bullion III M.D   On: 05/19/2018 10:34     Antimicrobials:   NONE   Subjective: No acute events overnight. Patient looking forward to getting out of the hospital, Scheduled for [facility] today.  Objective: Vitals:   05/29/18 0612 05/29/18 0816 05/29/18 0949 05/29/18 1213  BP: 109/74 (!) 89/54  (!) 99/58  Pulse: 72 60  64  Resp: 18 16  20   Temp: 99.4 F (37.4 C) 100.2 F (37.9 C) 98.9 F (37.2 C) 98.9 F (37.2 C)  TempSrc: Oral Oral  Oral  SpO2: 98% 96%  100%  Weight: 119.3 kg     Height:        Intake/Output Summary (Last 24 hours) at 05/29/2018 1332 Last data filed at 05/29/2018 1300 Gross per 24 hour  Intake 360 ml  Output -242 ml  Net 602 ml   Filed Weights   05/28/18 2130 05/29/18 0129 05/29/18 0612  Weight: 117.1 kg 117.3 kg 119.3 kg    Examination:  General exam: Appears calm and comfortable  Respiratory system: Clear to auscultation. Respiratory effort normal. Cardiovascular system: S1 & S2 heard, RRR. No JVD, murmurs, rubs, gallops or clicks. No pedal edema. Gastrointestinal system: Abdomen is nondistended, soft and nontender.Normal  bowel sounds heard. Central nervous system: Alert and oriented. No focal neurological deficits. Extremities: wwp, trace LE edema Skin: No rashes, lesions or ulcers Psychiatry: Judgement and insight appear normal. Mood & affect appropriate.     Data Reviewed: I have personally reviewed following labs and imaging studies  CBC: Recent Labs  Lab 05/26/18 0731 05/27/18 1254 05/28/18 0607 05/28/18 1210 05/29/18 0500  WBC 6.4 6.1 6.2 6.2 4.9  HGB 8.4* 7.5* 7.6* 8.3* 7.3*  HCT 25.0* 23.7* 24.2* 25.6* 22.9*  MCV 90.9 93.7 95.3 93.8 93.9  PLT 76* 72* 84* 85* 98*   Basic Metabolic Panel: Recent Labs  Lab 05/24/18 0527  05/25/18 0500  05/26/18 0731 05/26/18 1554 05/27/18 1244 05/28/18 0607 05/28/18 1210 05/29/18 0500  NA 135   < > 137   < > 138 139 136 138  139 138 137  K 4.0   < > 2.7*   < > 3.3* 3.2* 3.8 4.1  4.1 4.0 3.3*  CL 109   < > 106   < > 110 109 109 113*  113* 111 104  CO2 17*   < > 23   < > 20* 20* 20* 17*  18* 17* 23  GLUCOSE 198*   < > 123*   < > 139* 132* 125* 105*  105* 79 147*  BUN 45*   < > 20   < > 22* 24* 29* 33*  34* 36* 18  CREATININE 6.17*   < > 3.84*   < > 5.02* 5.34* 6.36* 6.98*  6.97* 7.31* 4.53*  CALCIUM 7.6*   < > 7.6*   < > 7.7* 7.8*  7.8* 7.5*  7.5* 7.8* 7.5*  MG 2.7*  --  2.3  --  2.3  --  2.4 2.6*  --   --   PHOS 7.8*   < > 4.1   < > 5.7* 6.0* 7.8* 8.4* 8.6* 4.3   < > = values in this interval not displayed.   GFR: Estimated Creatinine Clearance: 27.7 mL/min (A) (by C-G formula based on SCr of 4.53 mg/dL (H)). Liver Function Tests: Recent Labs  Lab 05/26/18 1554 05/27/18 1244 05/28/18 0607 05/28/18 1210 05/29/18 0500  ALBUMIN 2.4* 2.3* 2.3* 2.4* 2.2*   No results for input(s): LIPASE, AMYLASE in the last 168 hours. Recent Labs  Lab 05/24/18 0523 05/25/18 0500 05/26/18 0731 05/27/18 1710 05/28/18 0626  AMMONIA 33 29 52* 47* 58*   Coagulation Profile: Recent Labs  Lab 05/29/18 0500  INR 1.3*   Cardiac Enzymes: No  results for input(s): CKTOTAL, CKMB, CKMBINDEX, TROPONINI in the last 168 hours. BNP (last 3 results) No results for input(s): PROBNP in the last 8760 hours. HbA1C: No results for input(s): HGBA1C in the last 72 hours. CBG: Recent Labs  Lab 05/28/18 1404 05/28/18 1623 05/28/18 2034 05/29/18 0205 05/29/18 0820  GLUCAP 119* 112* 114* 121* 140*   Lipid Profile: No results for input(s): CHOL, HDL, LDLCALC, TRIG, CHOLHDL, LDLDIRECT in the last 72 hours. Thyroid Function Tests: No results for input(s): TSH, T4TOTAL, FREET4, T3FREE, THYROIDAB in the last 72 hours. Anemia Panel: No results for input(s): VITAMINB12, FOLATE, FERRITIN, TIBC, IRON, RETICCTPCT in the last 72 hours. Sepsis Labs: No results for input(s): PROCALCITON, LATICACIDVEN in the last 168 hours.  Recent Results (from the past 240 hour(s))  Blood Culture (routine x 2)     Status: None   Collection Time: 05/19/18  4:27 PM  Result Value Ref Range Status   Specimen Description BLOOD RIGHT HAND  Final   Special Requests AEROBIC BOTTLE ONLY Blood Culture adequate volume  Final   Culture   Final    NO GROWTH 5 DAYS Performed at [facility], [address], [place], [place] [address] [place] [address]    Report Status 05/24/2018 FINAL  Final         Radiology Studies: No results found.      Scheduled Meds: . sodium chloride   Intravenous Once  . amLODipine  5 mg Oral Daily  . carbamazepine  700 mg Oral BID  . Chlorhexidine Gluconate Cloth  6 each Topical Q0600  . ferrous sulfate  325 mg Oral Q breakfast  . heparin injection (subcutaneous)  5,000 Units Subcutaneous Q8H  . insulin aspart  0-5 Units Subcutaneous QHS  . insulin aspart  0-9 Units Subcutaneous Q4H  . insulin glargine  15 Units Subcutaneous QHS  . mouth rinse  15 mL Mouth Rinse BID  . PHENobarbital  243 mg Oral QHS  . simvastatin  20 mg Oral QHS  . sodium chloride flush  10 mL Intravenous Q12H  . tamsulosin  0.4 mg Oral QHS   Continuous Infusions:    LOS: 10 days    Time spent: 35 min    Nicolette Bang, MD [facility]  If 7PM-7AM, please contact night-coverage  05/29/2018, 1:32 PM

## 2018-05-29 NOTE — Progress Notes (Signed)
PT Cancellation Note  Patient Details Name: Eddie Davies MRN: [id number] DOB: 17-Mar-1970   Cancelled Treatment:    Reason Eval/Treat Not Completed: Patient declined, no reason specified(pt states he hasn't eaten and is waiting for a test. PT adamantly refused any activity or exercise until after test and eating despite education and encouragement)   Sandy Salaam Marquasha Brutus 05/29/2018, 10:32 AM  Elwyn Reach, PT Acute Rehabilitation Services Pager: [phone] Office: [phone]

## 2018-05-29 NOTE — Progress Notes (Addendum)
[facility] NEPHROLOGY PROGRESS NOTE  Assessment/ Plan: Pt is a 48 y.o. yo male with pancreatitis, CKD with baseline serum creatinine level around 1.9, admitted with pneumonia, VDRF altered mental status and AKI.  #Acute kidney injury on CKD, oliguric, dialysis dependent, CT abdomen pelvis with no obstruction, probably ATN.  He received dialysis on 4/30 and 4/5. He has temporary HD catheter.  IR consulted for the placement of TDC. I spoke with IR today, the procedure was postponed to tomorrow because of increased patient's temperature (100.2).  -NPO post midnight and hopefully patient will be able to have [facility] placed tomorrow.  -Next HD tomorrow and possibly discharge.  Outpatient dialysis arranged at [facility]. -watch for renal recovery.  # HTN: BP low, discontinue norvasc. May need midodrine before HD.  #Anemia: Iron saturation 36%: Continue ESA.  Monitor hemoglobin  #Metabolic acidosis:  Improved after HD  # Hyperphosphatemia: f/u PTH level.  Phos 4.3   #Bilateral nonobstructive nephrolithiasis, chronic pancreatitis: per primary team.   Subjective: Seen and examined at bedside.  The IR procedure was canceled because of elevated temperature.  Discussed with IR and with the patient.  No chest pain or shortness of breath.  Objective Vital signs in last 24 hours: Vitals:   05/29/18 0612 05/29/18 0816 05/29/18 0949 05/29/18 1213  BP: 109/74 (!) 89/54  (!) 99/58  Pulse: 72 60  64  Resp: 18 16  20   Temp: 99.4 F (37.4 C) 100.2 F (37.9 C) 98.9 F (37.2 C) 98.9 F (37.2 C)  TempSrc: Oral Oral  Oral  SpO2: 98% 96%  100%  Weight: 119.3 kg     Height:       Weight change:   Intake/Output Summary (Last 24 hours) at 05/29/2018 1426 Last data filed at 05/29/2018 1300 Gross per 24 hour  Intake 360 ml  Output -242 ml  Net 602 ml       Labs: Basic Metabolic Panel: Recent Labs  Lab 05/28/18 0607 05/28/18 1210 05/29/18 0500  NA 138  139 138 137  K 4.1  4.1 4.0  3.3*  CL 113*  113* 111 104  CO2 17*  18* 17* 23  GLUCOSE 105*  105* 79 147*  BUN 33*  34* 36* 18  CREATININE 6.98*  6.97* 7.31* 4.53*  CALCIUM 7.5*  7.5* 7.8* 7.5*  PHOS 8.4* 8.6* 4.3   Liver Function Tests: Recent Labs  Lab 05/28/18 0607 05/28/18 1210 05/29/18 0500  ALBUMIN 2.3* 2.4* 2.2*   No results for input(s): LIPASE, AMYLASE in the last 168 hours. Recent Labs  Lab 05/26/18 0731 05/27/18 1710 05/28/18 0626  AMMONIA 52* 47* 58*   CBC: Recent Labs  Lab 05/26/18 0731 05/27/18 1254 05/28/18 0607 05/28/18 1210 05/29/18 0500  WBC 6.4 6.1 6.2 6.2 4.9  HGB 8.4* 7.5* 7.6* 8.3* 7.3*  HCT 25.0* 23.7* 24.2* 25.6* 22.9*  MCV 90.9 93.7 95.3 93.8 93.9  PLT 76* 72* 84* 85* 98*   Cardiac Enzymes: No results for input(s): CKTOTAL, CKMB, CKMBINDEX, TROPONINI in the last 168 hours. CBG: Recent Labs  Lab 05/28/18 1404 05/28/18 1623 05/28/18 2034 05/29/18 0205 05/29/18 0820  GLUCAP 119* 112* 114* 121* 140*    Iron Studies: No results for input(s): IRON, TIBC, TRANSFERRIN, FERRITIN in the last 72 hours. Studies/Results: No results found.  Medications: Infusions:   Scheduled Medications: . sodium chloride   Intravenous Once  . amLODipine  5 mg Oral Daily  . carbamazepine  700 mg Oral BID  . Chlorhexidine Gluconate Cloth  6 each Topical Q0600  . ferrous sulfate  325 mg Oral Q breakfast  . heparin injection (subcutaneous)  5,000 Units Subcutaneous Q8H  . insulin aspart  0-5 Units Subcutaneous QHS  . insulin aspart  0-9 Units Subcutaneous Q4H  . insulin glargine  15 Units Subcutaneous QHS  . lactulose  10 g Oral BID  . mouth rinse  15 mL Mouth Rinse BID  . PHENobarbital  243 mg Oral QHS  . simvastatin  20 mg Oral QHS  . sodium chloride flush  10 mL Intravenous Q12H  . tamsulosin  0.4 mg Oral QHS    have reviewed scheduled and prn medications.  Physical Exam: General:NAD, comfortable Heart:RRR, s1s2 nl, rubs Lungs: Bibasal decreased breath sound, no  crackle Abdomen:soft, Non-tender, non-distended Extremities:No edema Dialysis Access: right IJ temp cath site clean.  Tramain Gershman Prasad Ysabel Cowgill 05/29/2018,2:26 PM  LOS: 10 days

## 2018-05-29 NOTE — Research (Addendum)
VITAK-19 Antibody fast Detection Kit Study   Informed Consent   Subject Name: Eddie Davies  This patient, Eddie Davies, has been consented to the above clinical trial according to FDA regulations, GCP guidelines and [employer]'s SOPs. The informed consent form and study design have been explained to this patient by this study coordinator on 05/29/2018. The patient demonstrated comprehension of this clinical trial and study requirements/expectations. No study procedures have been initiated before consenting of this patient. The patient was given sufficient time for reading the consent form. All risks, benefits and options have been thoroughly discussed and all questions were answered per the patient's satisfaction. This patient was not coerced in any way to participate in this clinical trial. This patient has voluntarily signed consent version 1.0 at 15:28 on 05/29/2018. A copy of the signed consent form was given to the patient and a copy was placed in the subject's medical record. Subject was thanked for their participation in research and contribution to science. The subject suffers from a slight tremor that can be seen in his signature.  Sub-I Dr. Baltazar Apo participated in the consent process and signed the consent form.   Due to this being a minimal risk study the [employer] Informed Consent Documentation Checklist and Coordinator Visit Worksheet will not be utilized for the inpatient arm of this study.   Please refer to the subjects paper source binder for further details of this visit.   Jon Billings, [place], [facility] Certified [place] [phone]

## 2018-05-30 ENCOUNTER — Inpatient Hospital Stay (HOSPITAL_COMMUNITY): Payer: Medicare Other

## 2018-05-30 ENCOUNTER — Encounter (HOSPITAL_COMMUNITY): Payer: Self-pay | Admitting: Interventional Radiology

## 2018-05-30 HISTORY — PX: IR FLUORO GUIDE CV LINE RIGHT: IMG2283

## 2018-05-30 HISTORY — PX: IR US GUIDE VASC ACCESS RIGHT: IMG2390

## 2018-05-30 LAB — RENAL FUNCTION PANEL
Albumin: 2.2 g/dL — ABNORMAL LOW (ref 3.5–5.0)
Albumin: 2.2 g/dL — ABNORMAL LOW (ref 3.5–5.0)
Anion gap: 11 (ref 5–15)
Anion gap: 9 (ref 5–15)
BUN: 22 mg/dL — ABNORMAL HIGH (ref 6–20)
BUN: 25 mg/dL — ABNORMAL HIGH (ref 6–20)
CO2: 23 mmol/L (ref 22–32)
CO2: 23 mmol/L (ref 22–32)
Calcium: 7.7 mg/dL — ABNORMAL LOW (ref 8.9–10.3)
Calcium: 7.4 mg/dL — ABNORMAL LOW (ref 8.9–10.3)
Chloride: 104 mmol/L (ref 98–111)
Chloride: 105 mmol/L (ref 98–111)
Creatinine, Ser: 5.76 mg/dL — ABNORMAL HIGH (ref 0.61–1.24)
Creatinine, Ser: 5.56 mg/dL — ABNORMAL HIGH (ref 0.61–1.24)
GFR calc Af Amer: 12 mL/min — ABNORMAL LOW (ref >60)
GFR calc Af Amer: 13 mL/min — ABNORMAL LOW (ref >60)
GFR calc non Af Amer: 11 mL/min — ABNORMAL LOW (ref >60)
GFR calc non Af Amer: 11 mL/min — ABNORMAL LOW (ref >60)
Glucose, Bld: 152 mg/dL — ABNORMAL HIGH (ref 70–99)
Glucose, Bld: 146 mg/dL — ABNORMAL HIGH (ref 70–99)
Phosphorus: 6.5 mg/dL — ABNORMAL HIGH (ref 2.5–4.6)
Phosphorus: 5.9 mg/dL — ABNORMAL HIGH (ref 2.5–4.6)
Potassium: 3.9 mmol/L (ref 3.5–5.1)
Potassium: 3.5 mmol/L (ref 3.5–5.1)
Sodium: 138 mmol/L (ref 135–145)
Sodium: 137 mmol/L (ref 135–145)

## 2018-05-30 LAB — CBC
HCT: 23.3 % — ABNORMAL LOW (ref 39.0–52.0)
HCT: 22.8 % — ABNORMAL LOW (ref 39.0–52.0)
Hemoglobin: 7.5 g/dL — ABNORMAL LOW (ref 13.0–17.0)
Hemoglobin: 7.3 g/dL — ABNORMAL LOW (ref 13.0–17.0)
MCH: 30.6 pg (ref 26.0–34.0)
MCH: 30.2 pg (ref 26.0–34.0)
MCHC: 32.2 g/dL (ref 30.0–36.0)
MCHC: 32 g/dL (ref 30.0–36.0)
MCV: 95.1 fL (ref 80.0–100.0)
MCV: 94.2 fL (ref 80.0–100.0)
Platelets: 117 10*3/uL — ABNORMAL LOW (ref 150–400)
Platelets: 123 10*3/uL — ABNORMAL LOW (ref 150–400)
RBC: 2.45 MIL/uL — ABNORMAL LOW (ref 4.22–5.81)
RBC: 2.42 MIL/uL — ABNORMAL LOW (ref 4.22–5.81)
RDW: 16.2 % — ABNORMAL HIGH (ref 11.5–15.5)
RDW: 16 % — ABNORMAL HIGH (ref 11.5–15.5)
WBC: 5.1 10*3/uL (ref 4.0–10.5)
WBC: 2.2 10*3/uL — ABNORMAL LOW (ref 4.0–10.5)
nRBC: 0 % (ref 0.0–0.2)
nRBC: 0 % (ref 0.0–0.2)

## 2018-05-30 LAB — PHENOBARBITAL LEVEL
Phenobarbital: 12.3 ug/mL — ABNORMAL LOW (ref 15.0–30.0)
Phenobarbital: 11.1 ug/mL — ABNORMAL LOW (ref 15.0–30.0)

## 2018-05-30 LAB — PTH, INTACT AND CALCIUM
Calcium, Total (PTH): 7.2 mg/dL — ABNORMAL LOW (ref 8.7–10.2)
PTH: 51 pg/mL (ref 15–65)

## 2018-05-30 LAB — GLUCOSE, CAPILLARY
Glucose-Capillary: 108 mg/dL — ABNORMAL HIGH (ref 70–99)
Glucose-Capillary: 74 mg/dL (ref 70–99)
Glucose-Capillary: 61 mg/dL — ABNORMAL LOW (ref 70–99)
Glucose-Capillary: 144 mg/dL — ABNORMAL HIGH (ref 70–99)

## 2018-05-30 LAB — SARS CORONAVIRUS 2 BY RT PCR (HOSPITAL ORDER, PERFORMED IN ~~LOC~~ HOSPITAL LAB): SARS Coronavirus 2: NEGATIVE

## 2018-05-30 MED ORDER — LIDOCAINE HCL 1 % IJ SOLN
INTRAMUSCULAR | Status: AC | PRN
  Administered 2018-05-30: 5 mL

## 2018-05-30 MED ORDER — GELATIN ABSORBABLE 12-7 MM EX MISC
CUTANEOUS | Status: AC | PRN
  Administered 2018-05-30: 1 via TOPICAL

## 2018-05-30 MED ORDER — LIDOCAINE HCL 1 % IJ SOLN
INTRAMUSCULAR | Status: AC
  Filled 2018-05-30: qty 20

## 2018-05-30 MED ORDER — CEFAZOLIN SODIUM-DEXTROSE 2-4 GM/100ML-% IV SOLN
INTRAVENOUS | Status: AC
  Administered 2018-05-30: 15:00:00 2 g via INTRAVENOUS
  Filled 2018-05-30: qty 100

## 2018-05-30 MED ORDER — FENTANYL CITRATE (PF) 100 MCG/2ML IJ SOLN
INTRAMUSCULAR | Status: AC
  Filled 2018-05-30: qty 2

## 2018-05-30 MED ORDER — HEPARIN SODIUM (PORCINE) 1000 UNIT/ML IJ SOLN
INTRAMUSCULAR | Status: AC
  Filled 2018-05-30: qty 1

## 2018-05-30 MED ORDER — HEPARIN SODIUM (PORCINE) 1000 UNIT/ML DIALYSIS
1000.0000 [IU] | INTRAMUSCULAR | Status: DC | PRN
  Administered 2018-05-31: 3800 [IU] via INTRAVENOUS_CENTRAL
  Filled 2018-05-30: qty 1

## 2018-05-30 MED ORDER — GELATIN ABSORBABLE 12-7 MM EX MISC
CUTANEOUS | Status: AC
  Filled 2018-05-30: qty 1

## 2018-05-30 MED ORDER — HEPARIN SODIUM (PORCINE) 1000 UNIT/ML IJ SOLN
INTRAMUSCULAR | Status: AC | PRN
  Administered 2018-05-30: 3800 [IU] via INTRAVENOUS

## 2018-05-30 MED ORDER — LIDOCAINE HCL (PF) 1 % IJ SOLN: 5.0000 mL | INTRAMUSCULAR | Status: DC | PRN

## 2018-05-30 MED ORDER — MIDAZOLAM HCL 2 MG/2ML IJ SOLN
INTRAMUSCULAR | Status: AC
  Filled 2018-05-30: qty 2

## 2018-05-30 MED ORDER — CEFAZOLIN SODIUM-DEXTROSE 2-4 GM/100ML-% IV SOLN
2.0000 g | INTRAVENOUS | Status: AC
  Administered 2018-05-30: 15:00:00 2 g via INTRAVENOUS
  Filled 2018-05-30: qty 100

## 2018-05-30 MED ORDER — PENTAFLUOROPROP-TETRAFLUOROETH EX AERO: 1.0000 "application " | INHALATION_SPRAY | CUTANEOUS | Status: DC | PRN

## 2018-05-30 MED ORDER — ALTEPLASE 2 MG IJ SOLR: 2.0000 mg | Freq: Once | INTRAMUSCULAR | Status: DC | PRN

## 2018-05-30 MED ORDER — SODIUM CHLORIDE 0.9 % IV SOLN: 100.0000 mL | INTRAVENOUS | Status: DC | PRN

## 2018-05-30 MED ORDER — CEFAZOLIN SODIUM-DEXTROSE 2-4 GM/100ML-% IV SOLN: 2.0000 g | Freq: Once | INTRAVENOUS | Status: DC

## 2018-05-30 MED ORDER — LIDOCAINE-PRILOCAINE 2.5-2.5 % EX CREA: 1.0000 "application " | TOPICAL_CREAM | CUTANEOUS | Status: DC | PRN

## 2018-05-30 NOTE — Procedures (Signed)
ESRD  S/P RT IJ HD TUNNELED CATH  No comp Stable EBL MIN Tip svcra Ready for use

## 2018-05-30 NOTE — Sedation Documentation (Addendum)
Notified Mickel Baas RN (patients primary nurse on 6E) about somnolence and EtCO2 ranging from 52-58.  Notified Mickel Baas RN that patient will not be receiving sedation.  Mickel Baas RN to notify primary care team of the findings.

## 2018-05-30 NOTE — Sedation Documentation (Signed)
Transport team member is transporting patient back to room 6E 09.  Pt placed on hospital bed with 3 person assist and slide board.  Pt placed on 3L O2 (same amount that patient came down to IR on).  Notified and provided report to Mickel Baas RN that procedure was completed, pt tolerated procedure without incident, no sedation given.  Case ended

## 2018-05-30 NOTE — Progress Notes (Signed)
[facility] NEPHROLOGY PROGRESS NOTE  Assessment/ Plan: Pt is a 48 y.o. yo male with pancreatitis, CKD with baseline serum creatinine level around 1.9, admitted with pneumonia, VDRF altered mental status and AKI.  #Acute kidney injury on CKD, oliguric, dialysis dependent, CT abdomen pelvis with no obstruction, probably ATN.  He received dialysis on 4/30 and 4/5. He has temporary HD catheter.  IR consulted for the placement of TDC. I spoke with IR today, plan for [facility] today.  Patient is currently n.p.o. -Hemodialysis today after the IR procedure.  CLIP for outpatient dialysis arranged at [facility] for AKI. -watch for renal recovery.  # HTN: BP low, discontinued norvasc. May need midodrine before HD.  #Anemia: Iron saturation 36%: Continue ESA.  Monitor hemoglobin  #Metabolic acidosis:  Improved after HD  # Hyperphosphatemia:  PTH 52.  Phos 6.5 and ca 7.7. I will start phoslo.   #Bilateral nonobstructive nephrolithiasis, chronic pancreatitis: per primary team.   Subjective: Seen and examined at bedside.  Plan for IR procedure today.  Patient is n.p.o.  Denied nausea vomiting chest pain shortness of breath.  Feels hungry. Objective Vital signs in last 24 hours: Vitals:   05/29/18 1213 05/29/18 1657 05/29/18 2008 05/30/18 0555  BP: (!) 99/58 (!) 86/45 109/60 136/79  Pulse: 64 (!) 58 64 (!) 52  Resp: 20 20 17 11   Temp: 98.9 F (37.2 C) 99.2 F (37.3 C) 99.6 F (37.6 C) 97.6 F (36.4 C)  TempSrc: Oral Oral Oral Oral  SpO2: 100% 99% 100% 100%  Weight:    115.6 kg  Height:       Weight change: -1.5 kg  Intake/Output Summary (Last 24 hours) at 05/30/2018 1152 Last data filed at 05/30/2018 0556 Gross per 24 hour  Intake 480 ml  Output 124 ml  Net 356 ml       Labs: Basic Metabolic Panel: Recent Labs  Lab 05/28/18 1210 05/29/18 0500 05/30/18 0352  NA 138 137 138  K 4.0 3.3* 3.9  CL 111 104 104  CO2 17* 23 23  GLUCOSE 79 147* 152*  BUN 36* 18 22*   CREATININE 7.31* 4.53* 5.76*  CALCIUM 7.8* 7.5*  7.2* 7.7*  PHOS 8.6* 4.3 6.5*   Liver Function Tests: Recent Labs  Lab 05/28/18 1210 05/29/18 0500 05/30/18 0352  ALBUMIN 2.4* 2.2* 2.2*   No results for input(s): LIPASE, AMYLASE in the last 168 hours. Recent Labs  Lab 05/26/18 0731 05/27/18 1710 05/28/18 0626  AMMONIA 52* 47* 58*   CBC: Recent Labs  Lab 05/27/18 1254 05/28/18 0607 05/28/18 1210 05/29/18 0500 05/30/18 0352  WBC 6.1 6.2 6.2 4.9 5.1  HGB 7.5* 7.6* 8.3* 7.3* 7.5*  HCT 23.7* 24.2* 25.6* 22.9* 23.3*  MCV 93.7 95.3 93.8 93.9 95.1  PLT 72* 84* 85* 98* 117*   Cardiac Enzymes: No results for input(s): CKTOTAL, CKMB, CKMBINDEX, TROPONINI in the last 168 hours. CBG: Recent Labs  Lab 05/29/18 1210 05/29/18 1651 05/29/18 2006 05/30/18 0804 05/30/18 1134  GLUCAP 78 101* 85 108* 74    Iron Studies: No results for input(s): IRON, TIBC, TRANSFERRIN, FERRITIN in the last 72 hours. Studies/Results: No results found.  Medications: Infusions:   Scheduled Medications: . sodium chloride   Intravenous Once  . carbamazepine  700 mg Oral BID  . Chlorhexidine Gluconate Cloth  6 each Topical Q0600  . Chlorhexidine Gluconate Cloth  6 each Topical Q0600  . [START ON 06/04/2018] darbepoetin (ARANESP) injection - DIALYSIS  60 mcg Intravenous Q Mon-HD  .  insulin aspart  0-5 Units Subcutaneous QHS  . insulin aspart  0-9 Units Subcutaneous TID WC  . insulin glargine  15 Units Subcutaneous QHS  . lactulose  10 g Oral BID  . mouth rinse  15 mL Mouth Rinse BID  . PHENobarbital  243 mg Oral QHS  . simvastatin  20 mg Oral QHS  . sodium chloride flush  10 mL Intravenous Q12H  . tamsulosin  0.4 mg Oral QHS    have reviewed scheduled and prn medications.  Physical Exam: General: Not in distress, lying flat Heart:RRR, s1s2 nl, rubs Lungs: Bibasal decreased breath sound, no wheezing. Abdomen:soft, Non-tender, non-distended Extremities:No edema Dialysis Access:  right IJ temp cath site clean.   Prasad  05/30/2018,11:52 AM  LOS: 11 days

## 2018-05-30 NOTE — Progress Notes (Signed)
[facility]                                                                              Patient Demographics  Eddie Davies, is a 48 y.o. male, DOB - Oct 12, 1970, SWF:[id number]  Admit date - 05/19/2018   Admitting Physician Jamesetta So, MD  Outpatient Primary MD for the patient is Practice, [place]  Outpatient specialists:   LOS - 11  days   Medical records reviewed and are as summarized below:    Chief Complaint  Patient presents with   Altered Mental Status       Brief summary  48 y.o.WM PMHxSeizures, CKD stage 3, presumed liver cirrhosis, HTN, hypertension,who presentedwith AMS. He was hospitalized from March 8-21 and March 28 April 11 for acute metabolic encephalopathy secondary to septic shock from ESBL positive Klebsiella and E faecalis UTI with subsequent hypoxic respiratory failure in the setting of ESBL pneumonia treated with meropenem and requiring mechanical ventilation and CRRT during hospitalizations  ED Course:Encephalopathy, UTI, AKI. Complex patient, recently discharged. Worsening confusion, elevated creatinine and NH4 levels. Hypotensive on arrival, rectal temp 88, transiently hypoxic. Labs with UTI. Creatinine up to 8.11, BUN 64. Hgb is low but stable. CXR with vascular congestion, mild. Pharmacy suggested Cefepime and Vanc was reasonable. MRSA PCR is pending. Given mild IVF. BP 91/51. Has not given sepsis bolus. PCCM to evaluate to determine if admission to their service is needed and she will let me know.  Patient was admitted to PCCM with acute renal failure and metabolic acidosis. Received CRRT in ICU by renal. Renal function and acidosis improved. Transferred to [facility] 4/28   Assessment & Plan    Acute on chronic CKD stage III -Baseline creatinine ~1.8, plateaued at 8.34 -Patient had presented with acute renal failure, metabolic acidosis, acute metabolic encephalopathy likely due to uremia -Nephrology  was consulted, patient underwent a CRT -IR was consulted.  TDC to be placed today, planned outpatient hemodialysis through skilled nursing facility MWF. -Plan for hemodialysis with today after [facility] placement -Disposition per nephrology  Hypertension -BP soft, Norvasc discontinued  Anemia of chronic disease due to CKD -Continue ESA -Received packed RBC transfusion 1 unit on 4/27 and 2 units on 4/30 Hemoglobin 7.5  Metabolic acidosis -Improved after hemodialysis  Acute metabolic encephalopathy -Likely due to #1, improving with HD  History of seizures -No evidence of seizure activity, patient on carbamazepine, phenobarbital Gabapentin on hold  History of liver cirrhosis Patient on rifaximin and lactulose at home however discontinued in the setting of normal-appearing liver on CT by CCM -Monitor ammonia level, most recent 58 on 5/4, lactulose added back  BPH Continue Flomax  Sepsis Ruled out, blood cultures negative till date, urine culture negative COVID test on 4/25 negative   Diabetes mellitus type 2 uncontrolled with CKD Hemoglobin A1c 8.3 Continue Lantus 15 units daily, sensitive sliding scale insulin -Monitor CBGs closely  Chronic thrombocytopenia Improving  Code Status: Full CODE STATUS DVT Prophylaxis: SCD's Family Communication: Discussed in detail with the patient, all imaging results, lab results explained to the patient    Disposition Plan: Will plan according to nephrology, will need hemodialysis  after [facility] placement  Time Spent in minutes 35 minutes  Procedures:  TDC placement today Hemodialysis  Consultants:   CCM Nephrology  Antimicrobials:   Anti-infectives (From admission, onward)   Start     Dose/Rate Route Frequency Ordered Stop   05/30/18 1530  ceFAZolin (ANCEF) IVPB 2g/100 mL premix     2 g 200 mL/hr over 30 Minutes Intravenous To Radiology 05/30/18 1445 05/31/18 1530   05/30/18 1445  ceFAZolin (ANCEF) IVPB 2g/100 mL premix  Status:   Discontinued     2 g 200 mL/hr over 30 Minutes Intravenous  Once 05/30/18 1444 05/30/18 1444   05/22/18 2200  rifaximin (XIFAXAN) tablet 550 mg  Status:  Discontinued    Note to Pharmacy:  For diarrhea     550 mg Oral 2 times daily 05/22/18 1732 05/22/18 1737   05/20/18 1800  ceFEPIme (MAXIPIME) 1 g in sodium chloride 0.9 % 100 mL IVPB  Status:  Discontinued     1 g 200 mL/hr over 30 Minutes Intravenous Every 24 hours 05/19/18 1122 05/19/18 1855   05/20/18 1200  vancomycin (VANCOCIN) 1,250 mg in sodium chloride 0.9 % 250 mL IVPB  Status:  Discontinued     1,250 mg 166.7 mL/hr over 90 Minutes Intravenous Every 24 hours 05/19/18 1855 05/21/18 0839   05/20/18 0500  ceFEPIme (MAXIPIME) 2 g in sodium chloride 0.9 % 100 mL IVPB  Status:  Discontinued     2 g 200 mL/hr over 30 Minutes Intravenous Every 12 hours 05/19/18 2120 05/21/18 0839   05/19/18 2300  ceFEPIme (MAXIPIME) 2 g in sodium chloride 0.9 % 100 mL IVPB  Status:  Discontinued     2 g 200 mL/hr over 30 Minutes Intravenous Every 12 hours 05/19/18 1855 05/19/18 2120   05/19/18 2200  rifaximin (XIFAXAN) tablet 550 mg  Status:  Discontinued     550 mg Oral 2 times daily 05/19/18 1606 05/21/18 0839   05/19/18 1404  vancomycin variable dose per unstable renal function (pharmacist dosing)  Status:  Discontinued      Does not apply See admin instructions 05/19/18 1405 05/19/18 1855   05/19/18 1000  vancomycin (VANCOCIN) IVPB 1000 mg/200 mL premix  Status:  Discontinued     1,000 mg 200 mL/hr over 60 Minutes Intravenous  Once 05/19/18 0955 05/19/18 1000   05/19/18 1000  ceFEPIme (MAXIPIME) 2 g in sodium chloride 0.9 % 100 mL IVPB     2 g 200 mL/hr over 30 Minutes Intravenous  Once 05/19/18 0955 05/19/18 1111   05/19/18 1000  vancomycin (VANCOCIN) 2,000 mg in sodium chloride 0.9 % 500 mL IVPB     2,000 mg 250 mL/hr over 120 Minutes Intravenous  Once 05/19/18 1000 05/19/18 1422          Medications  Scheduled Meds:  sodium chloride    Intravenous Once   carbamazepine  700 mg Oral BID   Chlorhexidine Gluconate Cloth  6 each Topical Q0600   Chlorhexidine Gluconate Cloth  6 each Topical Q0600   [START ON 06/04/2018] darbepoetin (ARANESP) injection - DIALYSIS  60 mcg Intravenous Q Mon-HD   insulin aspart  0-5 Units Subcutaneous QHS   insulin aspart  0-9 Units Subcutaneous TID WC   insulin glargine  15 Units Subcutaneous QHS   lactulose  10 g Oral BID   mouth rinse  15 mL Mouth Rinse BID   PHENobarbital  243 mg Oral QHS   simvastatin  20 mg Oral QHS   sodium chloride flush  10 mL Intravenous Q12H   tamsulosin  0.4 mg Oral QHS   Continuous Infusions:   ceFAZolin (ANCEF) IV     PRN Meds:.heparin, ondansetron (ZOFRAN) IV, sodium chloride flush      Subjective:   Eddie Davies was seen and examined today.  Sleepy, denies any complaints. Patient denies dizziness, chest pain, shortness of breath, abdominal pain, N/V/D/C, new weakness, numbess, tingling. No acute events overnight.    Objective:   Vitals:   05/29/18 2008 05/30/18 0555 05/30/18 1000 05/30/18 1409  BP: 109/60 136/79 (!) 95/51 135/80  Pulse: 64 (!) 52 (!) 55 61  Resp: 17 11  20   Temp: 99.6 F (37.6 C) 97.6 F (36.4 C)    TempSrc: Oral Oral    SpO2: 100% 100% 100% 100%  Weight:  115.6 kg    Height:        Intake/Output Summary (Last 24 hours) at 05/30/2018 1456 Last data filed at 05/30/2018 1223 Gross per 24 hour  Intake 120 ml  Output 174 ml  Net -54 ml     Wt Readings from Last 3 Encounters:  05/30/18 115.6 kg  05/05/18 122.2 kg  04/14/18 128.3 kg     Exam  General: Somewhat sleepy at the time of my examination but easily arousable and annoyed at being woken up  Eyes:   HEENT:  Atraumatic, normocephalic, normal oropharynx  Cardiovascular: S1 S2 auscultated, Regular rate and rhythm.  Respiratory: Clear to auscultation bilaterally, no wheezing, rales or rhonchi  Gastrointestinal: Soft, nontender, nondistended, +  bowel sounds  Ext: Trace pedal edema bilaterally  Neuro: No new deficits  Musculoskeletal: No digital cyanosis, clubbing  Skin: No rashes  Psych: Sleepy but arousable   Data Reviewed:  I have personally reviewed following labs and imaging studies  Micro Results No results found for this or any previous visit (from the past 240 hour(s)).  Radiology Reports Ct Abdomen Pelvis Wo Contrast  Result Date: 05/19/2018 CLINICAL DATA:  Increased creatinine and ammonia levels. Altered mental status. Slurred speech. EXAM: CT ABDOMEN AND PELVIS WITHOUT CONTRAST TECHNIQUE: Multidetector CT imaging of the abdomen and pelvis was performed following the standard protocol without IV contrast. COMPARISON:  07/09/2017 FINDINGS: Lower chest: Dependent atelectasis. Mild coronary artery calcification. Hepatobiliary: Postcholecystectomy. Unremarkable appearance of the liver. Pancreas: There is fatty stranding in an about the length of the pancreas. Scattered pancreatic parenchymal calcifications are present suggesting chronic pancreatitis. No obvious mass or ductal dilatation. Spleen: Unremarkable Adrenals/Urinary Tract: Prominent perinephric stranding bilaterally is similar to that seen on the prior study. Small punctate calculi throughout the collecting system of the left kidney. 1.4 cm calculus in the right renal pelvis. Adrenal glands are within normal limits. Foley catheter decompresses the bladder. Stomach/Bowel: No disproportionate dilatation of bowel. No obvious mass in the colon. Unremarkable stomach. Vascular/Lymphatic: Minimal atherosclerotic vascular calcifications. Several subcentimeter short axis diameter para-aortic lymph nodes. Reproductive: Unremarkable prostate. Other: There is no free fluid. There is stranding in the fat along the paracolic gutters and in the presacral space. There is also stranding in the subcutaneous fat. Musculoskeletal: No vertebral compression deformity. IMPRESSION: There is  stranding within the fat in an about the pancreas suggesting an inflammatory process. Correlation with amylase and lipase is recommended. Pancreatic calcifications are compatible with chronic pancreatitis. There is stable prominent bilateral perinephric stranding. These findings may represent a recurrent inflammatory process of the kidneys. Bilateral nephrolithiasis. Stranding within the retroperitoneal fat suggesting anasarca. Electronically Signed   By: Marybelle Killings M.D.   On:  05/19/2018 15:41   Dg Chest Port 1 View  Result Date: 05/21/2018 CLINICAL DATA:  Abnormal respiration. EXAM: PORTABLE CHEST 1 VIEW COMPARISON:  Radiograph of May 19, 2018. FINDINGS: Stable cardiomegaly. Right internal jugular catheter is unchanged in position. No pneumothorax is noted. Left lung base is not included in field-of-view. No definite pleural effusion is seen within the visualized lung fields. Bony thorax is unremarkable. No consolidative process is noted. IMPRESSION: Stable cardiomegaly.  No acute pulmonary abnormality seen. Electronically Signed   By: Marijo Conception M.D.   On: 05/21/2018 07:50   Dg Chest Port 1 View  Result Date: 05/19/2018 CLINICAL DATA:  Bedside central venous catheter placement. EXAM: PORTABLE CHEST 1 VIEW 8:55 p.m.: COMPARISON:  Portable chest x-ray earlier today at 10:03 a.m. and previously. FINDINGS: RIGHT jugular central venous catheter tip projects over the UPPER SVC. No evidence of pneumothorax or mediastinal hematoma. Near expiratory image which accounts for the bibasilar atelectasis. New mild perihilar airspace pulmonary edema since the examination earlier this morning. IMPRESSION: 1. RIGHT jugular central venous catheter tip projects over the SVC. No acute complicating features. 2. New mild perihilar airspace pulmonary edema since the examination earlier this morning. Electronically Signed   By: Evangeline Dakin M.D.   On: 05/19/2018 21:06   Dg Chest Port 1 View  Result Date:  05/19/2018 CLINICAL DATA:  Increased creatinine and ammonia levels. Surge speech. EXAM: PORTABLE CHEST 1 VIEW COMPARISON:  May 16, 2018 FINDINGS: Stable cardiomegaly. Mild pulmonary venous congestion without overt edema. No focal infiltrate. No pneumothorax. IMPRESSION: Cardiomegaly and pulmonary venous congestion. Electronically Signed   By: Dorise Bullion III M.D   On: 05/19/2018 10:34    Lab Data:  CBC: Recent Labs  Lab 05/27/18 1254 05/28/18 0607 05/28/18 1210 05/29/18 0500 05/30/18 0352  WBC 6.1 6.2 6.2 4.9 5.1  HGB 7.5* 7.6* 8.3* 7.3* 7.5*  HCT 23.7* 24.2* 25.6* 22.9* 23.3*  MCV 93.7 95.3 93.8 93.9 95.1  PLT 72* 84* 85* 98* 263*   Basic Metabolic Panel: Recent Labs  Lab 05/24/18 0527  05/25/18 0500  05/26/18 0731  05/27/18 1244 05/28/18 0607 05/28/18 1210 05/29/18 0500 05/30/18 0352  NA 135   < > 137   < > 138   < > 136 138   139 138 137 138  K 4.0   < > 2.7*   < > 3.3*   < > 3.8 4.1   4.1 4.0 3.3* 3.9  CL 109   < > 106   < > 110   < > 109 113*   113* 111 104 104  CO2 17*   < > 23   < > 20*   < > 20* 17*   18* 17* 23 23  GLUCOSE 198*   < > 123*   < > 139*   < > 125* 105*   105* 79 147* 152*  BUN 45*   < > 20   < > 22*   < > 29* 33*   34* 36* 18 22*  CREATININE 6.17*   < > 3.84*   < > 5.02*   < > 6.36* 6.98*   6.97* 7.31* 4.53* 5.76*  CALCIUM 7.6*   < > 7.6*   < > 7.7*   < > 7.8* 7.5*   7.5* 7.8* 7.5*   7.2* 7.7*  MG 2.7*  --  2.3  --  2.3  --  2.4 2.6*  --   --   --   PHOS  7.8*   < > 4.1   < > 5.7*   < > 7.8* 8.4* 8.6* 4.3 6.5*   < > = values in this interval not displayed.   GFR: Estimated Creatinine Clearance: 21.4 mL/min (A) (by C-G formula based on SCr of 5.76 mg/dL (H)). Liver Function Tests: Recent Labs  Lab 05/27/18 1244 05/28/18 0607 05/28/18 1210 05/29/18 0500 05/30/18 0352  ALBUMIN 2.3* 2.3* 2.4* 2.2* 2.2*   No results for input(s): LIPASE, AMYLASE in the last 168 hours. Recent Labs  Lab 05/24/18 0523 05/25/18 0500 05/26/18 0731  05/27/18 1710 05/28/18 0626  AMMONIA 33 29 52* 47* 58*   Coagulation Profile: Recent Labs  Lab 05/29/18 0500  INR 1.3*   Cardiac Enzymes: No results for input(s): CKTOTAL, CKMB, CKMBINDEX, TROPONINI in the last 168 hours. BNP (last 3 results) No results for input(s): PROBNP in the last 8760 hours. HbA1C: No results for input(s): HGBA1C in the last 72 hours. CBG: Recent Labs  Lab 05/29/18 1210 05/29/18 1651 05/29/18 2006 05/30/18 0804 05/30/18 1134  GLUCAP 78 101* 85 108* 74   Lipid Profile: No results for input(s): CHOL, HDL, LDLCALC, TRIG, CHOLHDL, LDLDIRECT in the last 72 hours. Thyroid Function Tests: No results for input(s): TSH, T4TOTAL, FREET4, T3FREE, THYROIDAB in the last 72 hours. Anemia Panel: No results for input(s): VITAMINB12, FOLATE, FERRITIN, TIBC, IRON, RETICCTPCT in the last 72 hours. Urine analysis:    Component Value Date/Time   COLORURINE AMBER (A) 05/19/2018 0941   APPEARANCEUR HAZY (A) 05/19/2018 0941   LABSPEC 1.014 05/19/2018 0941   PHURINE 5.0 05/19/2018 0941   GLUCOSEU 50 (A) 05/19/2018 0941   HGBUR SMALL (A) 05/19/2018 0941   BILIRUBINUR NEGATIVE 05/19/2018 0941   KETONESUR NEGATIVE 05/19/2018 0941   PROTEINUR 30 (A) 05/19/2018 0941   NITRITE NEGATIVE 05/19/2018 0941   LEUKOCYTESUR MODERATE (A) 05/19/2018 0941     Olan Kurek M.D. [facility] 05/30/2018, 2:56 PM  Pager: [phone] Between 7am to 7pm - call Pager - [phone]  After 7pm go to [email or web address] - password [email or web address]  Call night coverage person covering after 7pm

## 2018-05-30 NOTE — Progress Notes (Signed)
OT Cancellation Note  Patient Details Name: Eddie Davies MRN: [id number] DOB: 1970/12/04   Cancelled Treatment:    Reason Eval/Treat Not Completed: Other (comment). Refusing any OT activities today 2* being NPO for IR procedure. Will check back another day.  [place] 05/30/2018, 12:59 PM  Lesle Chris, OTR/L Acute Rehabilitation Services [phone] [facility] pager [phone] office 05/30/2018

## 2018-05-30 NOTE — TOC Progression Note (Signed)
Transition of Care [facility]) - Progression Note    Patient Details  Name: Eddie Davies MRN: [id number] Date of Birth: 09-25-1970  Transition of Care [facility]) CM/SW [place], LCSW Phone Number: 05/30/2018, 8:39 AM  Clinical Narrative: [facility] SNF will still require another COVID test prior to return. They said the rapid one will be fine.  Expected Discharge Plan: [place] Barriers to Discharge: Continued Medical Work up  Expected Discharge Plan and Services Expected Discharge Plan: [place] In-house Referral: Clinical Social Work Discharge Planning Services: NA Post Acute Care Choice: [place] Living arrangements for the past 2 months: [place] Expected Discharge Date: 05/25/18               DME Arranged: N/A DME Agency: NA       HH Arranged: NA HH Agency: NA         Social Determinants of Health (SDOH) Interventions    Readmission Risk Interventions Readmission Risk Prevention Plan 05/28/2018 05/23/2018 04/27/2018  Transportation Screening - Complete Complete  PCP or Specialist Appt within 5-7 Days - - Complete  Home Care Screening - - Complete  Medication Review (RN CM) - - Complete  Medication Review (RN Transport planner) - Complete -  [place] or Home Care Consult Complete (No Data) -  SW Recovery Care/Counseling Consult Complete - -  Palliative Care Screening - Not Applicable -  [place] - Complete -  Some recent data might be hidden

## 2018-05-30 NOTE — Sedation Documentation (Signed)
Pt is awake at this time (eyes open and responding appropriately), tolerating procedure without difficulty.   EtCO2 54.  Will cont to monitor

## 2018-05-30 NOTE — Consult Note (Signed)
Chief Complaint: Patient was seen in consultation today for AKI/tunneled HD catheter placement.  Referring Physician(s): Rosita Fire  Supervising Physician: Daryll Brod  Patient Status: [facility] - In-pt  History of Present Illness: Eddie Davies is a 48 y.o. male with a past medical history of hypertension, seizures, cirrhosis, CKD stage III, diabetes mellitus, and obesity. He presented to [facility] ED from SNF due to AMS. In ED, he was found to have acute renal failure along with metabolic acidosis and was admitted for further management. Nephrology was consulted who recommended continuous dialysis. Patient currently has a non-tunneled HD catheter and this is how he has been receiving his dialysis. Nephrology recommends placement of tunneled HD catheter.  IR requested by Dr. Carolin Sicks for possible image-guided tunneled hemodialysis catheter placement. Patient awake and alert laying in bed with no complaints at this time. Denies fever, chills, chest pain, dyspnea, abdominal pain, or headache.   Past Medical History:  Diagnosis Date   Cirrhosis ([place]) 04/2018   Diabetes mellitus without complication ([place])    Liver failure ([place])    Metabolic acidosis 98/9211   Renal failure     Past Surgical History:  Procedure Laterality Date   CHOLECYSTECTOMY      Allergies: Fish oil  Medications: Prior to Admission medications   Medication Sig Start Date End Date Taking? Authorizing Provider  acetaminophen (TYLENOL) 325 MG tablet Take 650 mg by mouth every 8 (eight) hours as needed for fever (pain).   Yes [provider]  amLODipine (NORVASC) 5 MG tablet Take 5 mg by mouth daily.    Yes [provider]  carbamazepine (TEGRETOL) 100 MG chewable tablet Chew 100 mg by mouth 2 (two) times daily. Take with 600 mg for a total dose of 700 mg.   Yes [provider]  carbamazepine (TEGRETOL) 200 MG tablet Take 600 mg by mouth 2 (two) times daily.   Yes  [provider]  ferrous sulfate 325 (65 FE) MG tablet Take 325 mg by mouth daily with breakfast.   Yes [provider]  guaiFENesin (MUCINEX) 600 MG 12 hr tablet Take 600 mg by mouth every 12 (twelve) hours as needed for cough (congestion).   Yes [provider]  hydrocerin (EUCERIN) CREA Apply 1 application topically 2 (two) times daily. Apply to body 04/30/18  Yes [name], [name], MD  Insulin Glargine (BASAGLAR KWIKPEN) 100 UNIT/ML SOPN Inject 17 Units into the skin 2 (two) times daily.    Yes [provider]  linagliptin (TRADJENTA) 5 MG TABS tablet Take 5 mg by mouth daily.   Yes [provider]  multivitamin (RENA-VIT) TABS tablet Take 1 tablet by mouth at bedtime. 04/30/18  Yes [name], [name], MD  Nutritional Supplements (FEEDING SUPPLEMENT, NEPRO CARB STEADY,) LIQD Take 237 mLs by mouth 2 (two) times daily between meals. 04/30/18  Yes [name], [name], MD  omeprazole (PRILOSEC OTC) 20 MG tablet Take 20 mg by mouth daily.    Yes [provider]  PHENobarbital (LUMINAL) 32.4 MG tablet Take 32.4 mg by mouth at bedtime. Take with #3  64.8 mg tablets for a total dose of 226.8 mg   Yes [provider]  PHENobarbital (LUMINAL) 64.8 MG tablet Take 194.4 mg by mouth at bedtime. Take with a 32.4 mg tablet for a total dose of 226.8 mg   Yes [provider]  rifaximin (XIFAXAN) 550 MG TABS tablet Take 550 mg by mouth 2 (two) times daily. For diarrhea   Yes  [provider]  simvastatin (ZOCOR) 20 MG tablet Take 20 mg by mouth at bedtime.    Yes [provider]  sodium bicarbonate 650 MG tablet Take 2 tablets (1,300 mg total) by mouth 3 (three) times daily. 04/30/18  Yes [name], [name], MD  tamsulosin (FLOMAX) 0.4 MG CAPS capsule Take 0.4 mg by mouth at bedtime.   Yes [provider]  Cholecalciferol (VITAMIN D) 50 MCG (2000 UT) tablet Take 2,000 Units by mouth daily.    [provider]    gabapentin (NEURONTIN) 100 MG capsule Take 100 mg by mouth 3 (three) times daily.     [provider]  insulin aspart (NOVOLOG) 100 UNIT/ML injection 0-15 Units, Subcutaneous, 3 times daily with meals CBG < 70: implement hypoglycemia protocol CBG 70 - 120: 0 units CBG 121 - 150: 2 units CBG 151 - 200: 3 units CBG 201 - 250: 5 units CBG 251 - 300: 8 units CBG 301 - 350: 11 units CBG 351 - 400: 15 units CBG > 400: call MD 04/30/18   [name], MD     History reviewed. No pertinent family history.  Social History   Socioeconomic History   Marital status: Single    Spouse name: Not on file   Number of children: Not on file   Years of education: Not on file   Highest education level: Not on file  Occupational History   Not on file  Social Needs   Financial resource strain: Not on file   Food insecurity:    Worry: Not on file    Inability: Not on file   Transportation needs:    Medical: Not on file    Non-medical: Not on file  Tobacco Use   Smoking status: Current Some Day Smoker    Types: Cigarettes   Smokeless tobacco: Never Used  Substance and Sexual Activity   Alcohol use: Not Currently   Drug use: Never   Sexual activity: Not on file  Lifestyle   Physical activity:    Days per week: Not on file    Minutes per session: Not on file   Stress: Not on file  Relationships   Social connections:    Talks on phone: Not on file    Gets together: Not on file    Attends religious service: Not on file    Active member of club or organization: Not on file    Attends meetings of clubs or organizations: Not on file    Relationship status: Not on file  Other Topics Concern   Not on file  Social History Narrative   Not on file     Review of Systems: A 12 point ROS discussed and pertinent positives are indicated in the HPI above.  All other systems are negative.  Review of Systems  Constitutional: Negative for chills and fever.   Respiratory: Negative for shortness of breath and wheezing.   Cardiovascular: Negative for chest pain and palpitations.  Gastrointestinal: Negative for abdominal pain.  Neurological: Negative for headaches.  Psychiatric/Behavioral: Negative for behavioral problems and confusion.    Vital Signs: BP 136/79 (BP Location: Right Arm)    Pulse (!) 52    Temp 97.6 F (36.4 C) (Oral)    Resp 11    Ht 6' 2"  (1.88 m)    Wt 254 lb 13.6 oz (115.6 kg)    SpO2 100%    BMI 32.72 kg/m   Physical Exam Vitals signs and nursing note reviewed.  Constitutional:      General: He is not in acute distress.    Appearance: Normal appearance.  Cardiovascular:     Rate and Rhythm: Normal rate and regular rhythm.     Heart sounds: Normal heart sounds.  Pulmonary:     Effort: Pulmonary effort is normal. No respiratory distress.     Breath sounds: Normal breath sounds. No wheezing.  Skin:    General: Skin is warm and dry.  Neurological:     Mental Status: He is alert and oriented to person, place, and time.  Psychiatric:        Mood and Affect: Mood normal.        Behavior: Behavior normal.        Thought Content: Thought content normal.        Judgment: Judgment normal.      MD Evaluation Airway: WNL Heart: WNL Abdomen: WNL Chest/ Lungs: WNL ASA  Classification: 3 Mallampati/Airway Score: Two   Imaging: Ct Abdomen Pelvis Wo Contrast  Result Date: 05/19/2018 CLINICAL DATA:  Increased creatinine and ammonia levels. Altered mental status. Slurred speech. EXAM: CT ABDOMEN AND PELVIS WITHOUT CONTRAST TECHNIQUE: Multidetector CT imaging of the abdomen and pelvis was performed following the standard protocol without IV contrast. COMPARISON:  07/09/2017 FINDINGS: Lower chest: Dependent atelectasis. Mild coronary artery calcification. Hepatobiliary: Postcholecystectomy. Unremarkable appearance of the liver. Pancreas: There is fatty stranding in an about the length of the pancreas. Scattered pancreatic  parenchymal calcifications are present suggesting chronic pancreatitis. No obvious mass or ductal dilatation. Spleen: Unremarkable Adrenals/Urinary Tract: Prominent perinephric stranding bilaterally is similar to that seen on the prior study. Small punctate calculi throughout the collecting system of the left kidney. 1.4 cm calculus in the right renal pelvis. Adrenal glands are within normal limits. Foley catheter decompresses the bladder. Stomach/Bowel: No disproportionate dilatation of bowel. No obvious mass in the colon. Unremarkable stomach. Vascular/Lymphatic: Minimal atherosclerotic vascular calcifications. Several subcentimeter short axis diameter para-aortic lymph nodes. Reproductive: Unremarkable prostate. Other: There is no free fluid. There is stranding in the fat along the paracolic gutters and in the presacral space. There is also stranding in the subcutaneous fat. Musculoskeletal: No vertebral compression deformity. IMPRESSION: There is stranding within the fat in an about the pancreas suggesting an inflammatory process. Correlation with amylase and lipase is recommended. Pancreatic calcifications are compatible with chronic pancreatitis. There is stable prominent bilateral perinephric stranding. These findings may represent a recurrent inflammatory process of the kidneys. Bilateral nephrolithiasis. Stranding within the retroperitoneal fat suggesting anasarca. Electronically Signed   By: Marybelle Killings M.D.   On: 05/19/2018 15:41   Dg Chest Port 1 View  Result Date: 05/21/2018 CLINICAL DATA:  Abnormal respiration. EXAM: PORTABLE CHEST 1 VIEW COMPARISON:  Radiograph of May 19, 2018. FINDINGS: Stable cardiomegaly. Right internal jugular catheter is unchanged in position. No pneumothorax is noted. Left lung base is not included in field-of-view. No definite pleural effusion is seen within the visualized lung fields. Bony thorax is unremarkable. No consolidative process is noted. IMPRESSION: Stable  cardiomegaly.  No acute pulmonary abnormality seen. Electronically Signed   By: Marijo Conception M.D.   On: 05/21/2018 07:50   Dg Chest Port 1 View  Result Date: 05/19/2018 CLINICAL DATA:  Bedside central venous catheter placement. EXAM: PORTABLE CHEST 1 VIEW 8:55 p.m.: COMPARISON:  Portable chest x-ray earlier today at 10:03 a.m. and previously. FINDINGS: RIGHT jugular central venous catheter tip projects over the UPPER SVC. No evidence of pneumothorax or mediastinal hematoma. Near expiratory  image which accounts for the bibasilar atelectasis. New mild perihilar airspace pulmonary edema since the examination earlier this morning. IMPRESSION: 1. RIGHT jugular central venous catheter tip projects over the SVC. No acute complicating features. 2. New mild perihilar airspace pulmonary edema since the examination earlier this morning. Electronically Signed   By: Evangeline Dakin M.D.   On: 05/19/2018 21:06   Dg Chest Port 1 View  Result Date: 05/19/2018 CLINICAL DATA:  Increased creatinine and ammonia levels. Surge speech. EXAM: PORTABLE CHEST 1 VIEW COMPARISON:  May 16, 2018 FINDINGS: Stable cardiomegaly. Mild pulmonary venous congestion without overt edema. No focal infiltrate. No pneumothorax. IMPRESSION: Cardiomegaly and pulmonary venous congestion. Electronically Signed   By: Dorise Bullion III M.D   On: 05/19/2018 10:34    Labs:  CBC: Recent Labs    05/28/18 0607 05/28/18 1210 05/29/18 0500 05/30/18 0352  WBC 6.2 6.2 4.9 5.1  HGB 7.6* 8.3* 7.3* 7.5*  HCT 24.2* 25.6* 22.9* 23.3*  PLT 84* 85* 98* 117*    COAGS: Recent Labs    04/09/18 0930 05/19/18 1339 05/21/18 0419 05/29/18 0500  INR 1.3* 1.6* 1.7* 1.3*  APTT  --  47*  --   --     BMP: Recent Labs    05/28/18 0607 05/28/18 1210 05/29/18 0500 05/30/18 0352  NA 138   139 138 137 138  K 4.1   4.1 4.0 3.3* 3.9  CL 113*   113* 111 104 104  CO2 17*   18* 17* 23 23  GLUCOSE 105*   105* 79 147* 152*  BUN 33*   34* 36* 18  22*  CALCIUM 7.5*   7.5* 7.8* 7.5*   7.2* 7.7*  CREATININE 6.98*   6.97* 7.31* 4.53* 5.76*  GFRNONAA 9*   9* 8* 14* 11*  GFRAA 10*   10* 9* 17* 12*    LIVER FUNCTION TESTS: Recent Labs    04/23/18 0435  04/25/18 0631  05/19/18 0936  05/21/18 0419  05/28/18 0607 05/28/18 1210 05/29/18 0500 05/30/18 0352  BILITOT 0.9  --  1.2  --  0.5  --  0.5  --   --   --   --   --   AST 11*  --  11*  --  12*  --  13*  --   --   --   --   --   ALT 7  --  9  --  16  --  15  --   --   --   --   --   ALKPHOS 109  --  118  --  140*  --  123  --   --   --   --   --   PROT 5.0*  --  5.6*  --  6.2*  --  5.6*  --   --   --   --   --   ALBUMIN 2.0*   < > 2.2*   < > 2.8*   < > 2.4*   < > 2.3* 2.4* 2.2* 2.2*   < > = values in this interval not displayed.     Assessment and Plan:  AKI. Plan for image-guided tunneled hemodialysis catheter conversion/placement today with Dr. Annamaria Boots. Patient is NPO. Afebrile and WBCs WNL. Heparin held per IR protocol. INR 1.3 seconds 05/29/2018.  Risks and benefits discussed with the patient including, but not limited to bleeding, infection, vascular injury, pneumothorax which may require chest tube placement, air embolism or even  death. All of the patient's questions were answered, patient is agreeable to proceed. Consent signed and in chart.   Thank you for this interesting consult.  I greatly enjoyed meeting Eddie Davies and look forward to participating in their care.  A copy of this report was sent to the requesting provider on this date.  Electronically Signed: Earley Abide, [name] 05/30/2018, 2:04 PM   I spent a total of 40 Minutes in face to face in clinical consultation, greater than 50% of which was counseling/coordinating care for AKI/tunneled HD catheter placement.

## 2018-05-30 NOTE — Sedation Documentation (Addendum)
Pt in IR room 1, placed supine on table, secured with one strap.  Noticed that the patient did not wake up when this RN and 3 other persons moved patient from bed to IR table.  Pt responds slowly to verbal stimulus.  Pt is oriented x4, slow to respond.  Pupils equal and reactive, 41m bilat.  Notified Dr. SAnnamaria Bootsthat patient EtCO2 is 52-58 and patient is somnolent at this time than when he first arrived to IR holding.  Per Dr. SAnnamaria Boots no sedation will be administered for the procedure.  Will notify primary RN of patients condition

## 2018-05-31 LAB — RENAL FUNCTION PANEL
Albumin: 2.2 g/dL — ABNORMAL LOW (ref 3.5–5.0)
Anion gap: 11 (ref 5–15)
BUN: 10 mg/dL (ref 6–20)
CO2: 27 mmol/L (ref 22–32)
Calcium: 7.4 mg/dL — ABNORMAL LOW (ref 8.9–10.3)
Chloride: 97 mmol/L — ABNORMAL LOW (ref 98–111)
Creatinine, Ser: 2.93 mg/dL — ABNORMAL HIGH (ref 0.61–1.24)
GFR calc Af Amer: 28 mL/min — ABNORMAL LOW (ref >60)
GFR calc non Af Amer: 24 mL/min — ABNORMAL LOW (ref >60)
Glucose, Bld: 160 mg/dL — ABNORMAL HIGH (ref 70–99)
Phosphorus: 2.5 mg/dL (ref 2.5–4.6)
Potassium: 3.4 mmol/L — ABNORMAL LOW (ref 3.5–5.1)
Sodium: 135 mmol/L (ref 135–145)

## 2018-05-31 LAB — GLUCOSE, CAPILLARY
Glucose-Capillary: 87 mg/dL (ref 70–99)
Glucose-Capillary: 143 mg/dL — ABNORMAL HIGH (ref 70–99)

## 2018-05-31 MED ORDER — BASAGLAR KWIKPEN 100 UNIT/ML ~~LOC~~ SOPN: 15.0000 [IU] | PEN_INJECTOR | Freq: Every day | SUBCUTANEOUS | Status: DC

## 2018-05-31 MED ORDER — HEPARIN SODIUM (PORCINE) 1000 UNIT/ML IJ SOLN
INTRAMUSCULAR | Status: AC
  Filled 2018-05-31: qty 4

## 2018-05-31 MED ORDER — PHENOBARBITAL 32.4 MG PO TABS: 226.8000 mg | ORAL_TABLET | Freq: Every day | ORAL | 0 refills | Status: DC

## 2018-05-31 MED ORDER — LACTULOSE 10 GM/15ML PO SOLN: 10.0000 g | Freq: Two times a day (BID) | ORAL | 0 refills | Status: DC

## 2018-05-31 MED ORDER — BASAGLAR KWIKPEN 100 UNIT/ML ~~LOC~~ SOPN: 13.0000 [IU] | PEN_INJECTOR | Freq: Every day | SUBCUTANEOUS | Status: DC

## 2018-05-31 NOTE — NC FL2 (Signed)
[place] LEVEL OF CARE SCREENING TOOL     IDENTIFICATION  Patient Name: Eddie Davies Birthdate: 1970/06/07 Sex: male Admission Date (Current Location): 05/19/2018  [facility] and [place] Number:  [employer] and Address:  [facility] [place] [place] [address] [place], [place] [address]      Provider Number: [id number]  Attending Physician Name and Address:  Mendel Corning, MD  Relative Name and Phone Number:       Current Level of Care: Hospital Recommended Level of Care: [place] Prior Approval Number:    Date Approved/Denied:   PASRR Number: [id number]  Discharge Plan: SNF    Current Diagnoses: Patient Active Problem List   Diagnosis Date Noted  . Acute metabolic encephalopathy 46/27/0350  . Seizures ([place]) 05/24/2018  . BPH (benign prostatic hyperplasia) 05/24/2018  . Sepsis ([place]) 05/24/2018  . Benign essential HTN 05/24/2018  . HLD (hyperlipidemia) 05/24/2018  . Diabetes mellitus type 2, uncontrolled, with complications ([place]) 09/38/1829  . Thrombocytopenia ([place]) 05/24/2018  . Confusion   . Metabolic acidosis 93/71/6967  . Respiratory failure ([place])   . Acute renal failure superimposed on stage 4 chronic kidney disease ([place])   . End stage liver disease ([place])   . Pressure injury of skin 04/21/2018  . Altered mental status, unspecified 04/21/2018  . HCAP (healthcare-associated pneumonia) 04/21/2018  . Acute renal failure (ARF) ([place]) 04/07/2018    Orientation RESPIRATION BLADDER Height & Weight     Self, Situation, Time, Place  Normal Continent, Indwelling catheter Weight: 253 lb 12 oz (115.1 kg) Height:  6' 2"  (188 cm)  BEHAVIORAL SYMPTOMS/MOOD NEUROLOGICAL BOWEL NUTRITION STATUS  (None) Convulsions/Seizures Incontinent Diet(Renal/carb modified with fluid restriction 1200 mL. )  AMBULATORY STATUS COMMUNICATION OF NEEDS Skin   Extensive Assist Verbally Bruising, Other (Comment), PU Stage and Appropriate  Care(Cracking, Eczema, MASD, Skin tear.)   PU Stage 2 Dressing: No Dressing(Medial coccyx.)                   Personal Care Assistance Level of Assistance  Bathing, Feeding, Dressing Bathing Assistance: Maximum assistance Feeding assistance: Limited assistance Dressing Assistance: Maximum assistance     Functional Limitations Info  Sight, Hearing, Speech Sight Info: Adequate Hearing Info: Adequate Speech Info: Adequate    SPECIAL CARE FACTORS FREQUENCY  Blood pressure, PT (By licensed PT), OT (By licensed OT)     PT Frequency: 5 x week OT Frequency: 5 x week            Contractures Contractures Info: Not present    Additional Factors Info  Code Status, Allergies, Isolation Precautions Code Status Info: Full Allergies Info: Fish Oil     Isolation Precautions Info: Contact: ESBL     Current Medications (05/31/2018):  This is the current hospital active medication list Current Facility-Administered Medications  Medication Dose Route Frequency Provider Last Rate Last Dose  . carbamazepine (TEGRETOL) tablet 700 mg  700 mg Oral BID Chesley Mires, MD   700 mg at 05/31/18 1105  . Chlorhexidine Gluconate Cloth 2 % PADS 6 each  6 each Topical Q0600 Rexene Agent, MD   6 each at 05/27/18 [phone]  . Chlorhexidine Gluconate Cloth 2 % PADS 6 each  6 each Topical Q0600 Rosita Fire, MD   6 each at 05/31/18 [phone]  . [START ON 06/04/2018] Darbepoetin Alfa (ARANESP) injection 60 mcg  60 mcg Intravenous Q Mon-HD Rosita Fire, MD      . heparin  injection 1,000-6,000 Units  1,000-6,000 Units CRRT PRN Chesley Mires, MD   2,400 Units at 05/24/18 1605  . insulin aspart (novoLOG) injection 0-5 Units  0-5 Units Subcutaneous QHS Chesley Mires, MD   2 Units at 05/25/18 2134  . insulin aspart (novoLOG) injection 0-9 Units  0-9 Units Subcutaneous TID WC Kirby-Graham, Karsten Fells, NP      . insulin glargine (LANTUS) injection 15 Units  15 Units Subcutaneous QHS Allie Bossier, MD   15  Units at 05/31/18 0306  . lactulose (CHRONULAC) 10 GM/15ML solution 10 g  10 g Oral BID Marcell Anger, MD   10 g at 05/31/18 1106  . MEDLINE mouth rinse  15 mL Mouth Rinse BID Chesley Mires, MD   15 mL at 05/31/18 0257  . ondansetron (ZOFRAN) injection 4 mg  4 mg Intravenous Q6H PRN Elodia Florence., MD   4 mg at 05/21/18 2155  . phenobarbital (LUMINAL) tablet 243 mg  243 mg Oral QHS Marcell Anger, MD   243 mg at 05/31/18 0258  . simvastatin (ZOCOR) tablet 20 mg  20 mg Oral QHS Chesley Mires, MD   20 mg at 05/31/18 0259  . sodium chloride flush (NS) 0.9 % injection 10 mL  10 mL Intravenous Q12H Elodia Florence., MD   10 mL at 05/31/18 1109  . sodium chloride flush (NS) 0.9 % injection 10-40 mL  10-40 mL Intracatheter PRN Elodia Florence., MD   10 mL at 05/23/18 [phone]  . tamsulosin (FLOMAX) capsule 0.4 mg  0.4 mg Oral QHS Chesley Mires, MD   0.4 mg at 05/31/18 6063     Discharge Medications: Please see discharge summary for a list of discharge medications.  Relevant Imaging Results:  Relevant Lab Results:   Additional Information SS#: [id number]  Candie Chroman, LCSW

## 2018-05-31 NOTE — Progress Notes (Signed)
OT Cancellation Note  Patient Details Name: Eddie Davies MRN: [id number] DOB: February 13, 1970   Cancelled Treatment:    Reason Eval/Treat Not Completed: Patient declined, no reason specified. Pt states he is discharging today and does not want to participate in therapy.   Darrol Jump OTR/L 05/31/2018, 12:25 PM

## 2018-05-31 NOTE — Progress Notes (Signed)
[facility] NEPHROLOGY PROGRESS NOTE  Assessment/ Plan: Pt is a 48 y.o. yo male with pancreatitis, CKD with baseline serum creatinine level around 1.9, admitted with pneumonia, VDRF altered mental status and AKI.  #Acute kidney injury on CKD, oliguric, dialysis dependent, CT abdomen pelvis with no obstruction, probably ATN.  -Tolerating dialysis well.  Status post right IJ TDC on 5/6 by IR.  Last HD yesterday.  Plan to discharged today with outpatient dialysis at a hospital.  Watch for renal recovery.  # HTN: discontinued norvasc.  Monitor BP.  #Anemia: Iron saturation 36%: Continue ESA.  Monitor hemoglobin  #Metabolic acidosis:  Improved after HD  # Hyperphosphatemia:  PTH 52.  Monitor electrolytes.  #Bilateral nonobstructive nephrolithiasis, chronic pancreatitis: per primary team.   Subjective: Seen and examined at bedside.  Tolerated dialysis well.  Plan to discharge today.  Patient denied chest pain, shortness of breath. Objective Vital signs in last 24 hours: Vitals:   05/31/18 0217 05/31/18 0255 05/31/18 0605 05/31/18 1054  BP: 139/72 133/89 130/73 (!) 142/73  Pulse: 70 65 64 63  Resp: 11 16  18   Temp: 98.6 F (37 C) 99.3 F (37.4 C) 99.4 F (37.4 C)   TempSrc: Oral  Oral   SpO2: 96% 96% 95% 99%  Weight: 114.5 kg  115.1 kg   Height:       Weight change: 1.4 kg  Intake/Output Summary (Last 24 hours) at 05/31/2018 1242 Last data filed at 05/31/2018 0915 Gross per 24 hour  Intake 480 ml  Output 2000 ml  Net -1520 ml       Labs: Basic Metabolic Panel: Recent Labs  Lab 05/30/18 0352 05/30/18 2234 05/31/18 0355  NA 138 137 135  K 3.9 3.5 3.4*  CL 104 105 97*  CO2 23 23 27   GLUCOSE 152* 146* 160*  BUN 22* 25* 10  CREATININE 5.76* 5.56* 2.93*  CALCIUM 7.7* 7.4* 7.4*  PHOS 6.5* 5.9* 2.5   Liver Function Tests: Recent Labs  Lab 05/30/18 0352 05/30/18 2234 05/31/18 0355  ALBUMIN 2.2* 2.2* 2.2*   No results for input(s): LIPASE, AMYLASE  in the last 168 hours. Recent Labs  Lab 05/26/18 0731 05/27/18 1710 05/28/18 0626  AMMONIA 52* 47* 58*   CBC: Recent Labs  Lab 05/28/18 0607 05/28/18 1210 05/29/18 0500 05/30/18 0352 05/30/18 2234  WBC 6.2 6.2 4.9 5.1 2.2*  HGB 7.6* 8.3* 7.3* 7.5* 7.3*  HCT 24.2* 25.6* 22.9* 23.3* 22.8*  MCV 95.3 93.8 93.9 95.1 94.2  PLT 84* 85* 98* 117* 123*   Cardiac Enzymes: No results for input(s): CKTOTAL, CKMB, CKMBINDEX, TROPONINI in the last 168 hours. CBG: Recent Labs  Lab 05/30/18 1134 05/30/18 1641 05/30/18 2137 05/31/18 0247 05/31/18 0742  GLUCAP 74 61* 144* 87 143*    Iron Studies: No results for input(s): IRON, TIBC, TRANSFERRIN, FERRITIN in the last 72 hours. Studies/Results: Ir Fluoro Guide Cv Line Right  Result Date: 05/30/2018 INDICATION: END-STAGE RENAL DISEASE EXAM: ULTRASOUND GUIDANCE FOR VASCULAR ACCESS RIGHT INTERNAL JUGULAR PERMANENT HEMODIALYSIS CATHETER Date:  05/30/2018 05/30/2018 4:12 pm Radiologist:  Jerilynn Mages. Daryll Brod, MD Guidance:  Ultrasound fluoroscopic FLUOROSCOPY TIME:  Fluoroscopy Time: 0 minutes 36 seconds (9 mGy). MEDICATIONS: 2 g Ancef administered within 1 hour the procedure ANESTHESIA/SEDATION: Moderate Sedation Time:  None. The patient was continuously monitored during the procedure by the interventional radiology nurse under my direct supervision. CONTRAST:  None. COMPLICATIONS: None immediate. PROCEDURE: Informed consent was obtained from the patient following explanation of the procedure, risks, benefits  and alternatives. The patient understands, agrees and consents for the procedure. All questions were addressed. A time out was performed. Maximal barrier sterile technique utilized including caps, mask, sterile gowns, sterile gloves, large sterile drape, hand hygiene, and 2% chlorhexidine scrub. Under sterile conditions and local anesthesia, right internal jugular micropuncture venous access was performed with ultrasound. Images were obtained for  documentation. A guide wire was inserted followed by a transitional dilator. Next, a 0.035 guidewire was advanced into the IVC with a 5-French catheter. Measurements were obtained from the right venotomy site to the proximal right atrium. In the right infraclavicular chest, a subcutaneous tunnel was created under sterile conditions and local anesthesia. 1% lidocaine with epinephrine was utilized for this. The 23 cm tip to cuff palindrome catheter was tunneled subcutaneously to the venotomy site and inserted into the SVC/RA junction through a valved peel-away sheath. Position was confirmed with fluoroscopy. Images were obtained for documentation. Blood was aspirated from the catheter followed by saline and heparin flushes. The appropriate volume and strength of heparin was instilled in each lumen. Caps were applied. The catheter was secured at the tunnel site with Gelfoam and a pursestring suture. The venotomy site was closed with subcuticular Vicryl suture. Dermabond was applied to the small right neck incision. A dry sterile dressing was applied. The catheter is ready for use. No immediate complications. IMPRESSION: Ultrasound and fluoroscopically guided right internal jugular tunneled hemodialysis catheter (23 cm tip to cuff palindrome catheter). Electronically Signed   By: Jerilynn Mages.  Shick M.D.   On: 05/30/2018 16:15   Ir US Guide Vasc Access Right  Result Date: 05/30/2018 INDICATION: END-STAGE RENAL DISEASE EXAM: ULTRASOUND GUIDANCE FOR VASCULAR ACCESS RIGHT INTERNAL JUGULAR PERMANENT HEMODIALYSIS CATHETER Date:  05/30/2018 05/30/2018 4:12 pm Radiologist:  Jerilynn Mages. Daryll Brod, MD Guidance:  Ultrasound fluoroscopic FLUOROSCOPY TIME:  Fluoroscopy Time: 0 minutes 36 seconds (9 mGy). MEDICATIONS: 2 g Ancef administered within 1 hour the procedure ANESTHESIA/SEDATION: Moderate Sedation Time:  None. The patient was continuously monitored during the procedure by the interventional radiology nurse under my direct supervision.  CONTRAST:  None. COMPLICATIONS: None immediate. PROCEDURE: Informed consent was obtained from the patient following explanation of the procedure, risks, benefits and alternatives. The patient understands, agrees and consents for the procedure. All questions were addressed. A time out was performed. Maximal barrier sterile technique utilized including caps, mask, sterile gowns, sterile gloves, large sterile drape, hand hygiene, and 2% chlorhexidine scrub. Under sterile conditions and local anesthesia, right internal jugular micropuncture venous access was performed with ultrasound. Images were obtained for documentation. A guide wire was inserted followed by a transitional dilator. Next, a 0.035 guidewire was advanced into the IVC with a 5-French catheter. Measurements were obtained from the right venotomy site to the proximal right atrium. In the right infraclavicular chest, a subcutaneous tunnel was created under sterile conditions and local anesthesia. 1% lidocaine with epinephrine was utilized for this. The 23 cm tip to cuff palindrome catheter was tunneled subcutaneously to the venotomy site and inserted into the SVC/RA junction through a valved peel-away sheath. Position was confirmed with fluoroscopy. Images were obtained for documentation. Blood was aspirated from the catheter followed by saline and heparin flushes. The appropriate volume and strength of heparin was instilled in each lumen. Caps were applied. The catheter was secured at the tunnel site with Gelfoam and a pursestring suture. The venotomy site was closed with subcuticular Vicryl suture. Dermabond was applied to the small right neck incision. A dry sterile dressing was applied. The catheter  is ready for use. No immediate complications. IMPRESSION: Ultrasound and fluoroscopically guided right internal jugular tunneled hemodialysis catheter (23 cm tip to cuff palindrome catheter). Electronically Signed   By: Jerilynn Mages.  Shick M.D.   On: 05/30/2018 16:15     Medications: Infusions:   Scheduled Medications: . carbamazepine  700 mg Oral BID  . Chlorhexidine Gluconate Cloth  6 each Topical Q0600  . Chlorhexidine Gluconate Cloth  6 each Topical Q0600  . [START ON 06/04/2018] darbepoetin (ARANESP) injection - DIALYSIS  60 mcg Intravenous Q Mon-HD  . insulin aspart  0-5 Units Subcutaneous QHS  . insulin aspart  0-9 Units Subcutaneous TID WC  . insulin glargine  15 Units Subcutaneous QHS  . lactulose  10 g Oral BID  . mouth rinse  15 mL Mouth Rinse BID  . PHENobarbital  243 mg Oral QHS  . simvastatin  20 mg Oral QHS  . sodium chloride flush  10 mL Intravenous Q12H  . tamsulosin  0.4 mg Oral QHS    have reviewed scheduled and prn medications.  Physical Exam: General: Not in distress, lying flat Heart:RRR, s1s2 nl, rubs Lungs: Bibasal decreased breath sound, no wheezing. Abdomen:soft, Non-tender, non-distended Extremities:No edema Dialysis Access: right IJ temp cath site clean.  Dron Reesa Chew Bhandari 05/31/2018,12:42 PM  LOS: 12 days

## 2018-05-31 NOTE — Progress Notes (Signed)
PT Cancellation Note  Patient Details Name: Eddie Davies MRN: [id number] DOB: October 10, 1970   Cancelled Treatment:    Reason Eval/Treat Not Completed: Patient declined, no reason specified(pt reports he is awaiting D/C and refuses therapy at this time)   [place] 05/31/2018, 10:55 AM Elwyn Reach, PT Acute Rehabilitation Services Pager: [phone] Office: [phone]

## 2018-05-31 NOTE — TOC Transition Note (Signed)
Transition of Care [facility]) - CM/SW Discharge Note   Patient Details  Name: Eddie Davies MRN: [id number] Date of Birth: February 01, 1970  [facility] CM/SW Contact:  Candie Chroman, LCSW Phone Number: 05/31/2018, 11:53 AM   Clinical Narrative: CSW facilitated patient discharge including contacting facility to confirm patient discharge plans. Patient declined for CSW to call family. Clinical information faxed to facility and family agreeable with plan. CSW arranged ambulance transport via [facility] to [facility]. RN to call report prior to discharge [phone]).  CSW will sign off for now as social work intervention is no longer needed. Please consult [place] again if new needs arise.  Final next level of care: Skilled Nursing Facility Barriers to Discharge: Barriers Resolved   Patient Goals and CMS Choice   CMS Medicare.gov Compare Post Acute Care list provided to:: Other (Comment Required)(Pt long term care resident at [facility]) Choice offered to / list presented to : NA  Discharge Placement   Existing PASRR number confirmed : 05/31/18          Patient chooses bed at: [facility] Patient to be transferred to facility by: [place] Name of family member notified: Patient declined for CSW to call family Patient and family notified of of transfer: 05/31/18  Discharge Plan and Services In-house Referral: Clinical Social Work Discharge Planning Services: NA Post Acute Care Choice: [place]          DME Arranged: N/A DME Agency: NA       HH Arranged: NA [place] Agency: NA        Social Determinants of Health (SDOH) Interventions     Readmission Risk Interventions Readmission Risk Prevention Plan 05/28/2018 05/23/2018 04/27/2018  Transportation Screening - Complete Complete  PCP or Specialist Appt within 5-7 Days - - Complete  Home Care Screening - - Complete  Medication Review (RN CM) - - Complete  Medication Review (RN Care Manager) - Complete -   [place] or Home Care Consult Complete (No Data) -  SW Recovery Care/Counseling Consult Complete - -  Palliative Care Screening - Not Applicable -  [place] - Complete -  Some recent data might be hidden

## 2018-05-31 NOTE — Discharge Summary (Signed)
Physician Discharge Summary   Patient ID: Eddie Davies MRN: [id number] DOB/AGE: May 23, 1970 48 y.o.  Admit date: 05/19/2018 Discharge date: 05/31/2018  Primary Care Physician:  [facility]   Recommendations for Outpatient Follow-up:  1. Follow up with PCP in 1-2 weeks or telehealth visit 2. COVID19 NEGATIVE  3. Patient will receive dialysis at outpatient dialysis center at [facility], has been arranged  Home Health: Patient returning to skilled nursing facility Equipment/Devices:   Discharge Condition: stable  CODE STATUS: FULL  Diet recommendation: Carb modified diet   Discharge Diagnoses:   Acute kidney injury on chronic CKD stage III, dialysis dependent . Acute metabolic acidosis . Acute metabolic encephalopathy . History of seizures . Acute metabolic encephalopathy . BPH (benign prostatic hyperplasia) . History of liver cirrhosis . Benign essential HTN . HLD (hyperlipidemia) . Diabetes mellitus type 2, uncontrolled, with complications ([place]) . Chronic thrombocytopenia ([place]) Sepsis ruled out  Consults: Nephrology CCM Intervention radiology  Allergies:   Allergies  Allergen Reactions  . Fish Oil Swelling and Other (See Comments)          DISCHARGE MEDICATIONS: Allergies as of 05/31/2018      Reactions   Fish Oil Swelling, Other (See Comments)         Medication List    STOP taking these medications   amLODipine 5 MG tablet Commonly known as:  NORVASC   gabapentin 100 MG capsule Commonly known as:  NEURONTIN   linagliptin 5 MG Tabs tablet Commonly known as:  TRADJENTA   rifaximin 550 MG Tabs tablet Commonly known as:  XIFAXAN   sodium bicarbonate 650 MG tablet     TAKE these medications   acetaminophen 325 MG tablet Commonly known as:  TYLENOL Take 650 mg by mouth every 8 (eight) hours as needed for fever (pain).   Basaglar KwikPen 100 UNIT/ML Sopn Inject 0.13 mLs (13 Units total) into the skin at bedtime. What  changed:    how much to take  when to take this   carbamazepine 200 MG tablet Commonly known as:  TEGRETOL Take 600 mg by mouth 2 (two) times daily.   carbamazepine 100 MG chewable tablet Commonly known as:  TEGRETOL Chew 100 mg by mouth 2 (two) times daily. Take with 600 mg for a total dose of 700 mg.   feeding supplement (NEPRO CARB STEADY) Liqd Take 237 mLs by mouth 2 (two) times daily between meals.   ferrous sulfate 325 (65 FE) MG tablet Take 325 mg by mouth daily with breakfast.   guaiFENesin 600 MG 12 hr tablet Commonly known as:  MUCINEX Take 600 mg by mouth every 12 (twelve) hours as needed for cough (congestion).   hydrocerin Crea Apply 1 application topically 2 (two) times daily. Apply to body   insulin aspart 100 UNIT/ML injection Commonly known as:  novoLOG 0-15 Units, Subcutaneous, 3 times daily with meals CBG < 70: implement hypoglycemia protocol CBG 70 - 120: 0 units CBG 121 - 150: 2 units CBG 151 - 200: 3 units CBG 201 - 250: 5 units CBG 251 - 300: 8 units CBG 301 - 350: 11 units CBG 351 - 400: 15 units CBG > 400: call MD   lactulose 10 GM/15ML solution Commonly known as:  CHRONULAC Take 15 mLs (10 g total) by mouth 2 (two) times daily.   multivitamin Tabs tablet Take 1 tablet by mouth at bedtime.   omeprazole 20 MG tablet Commonly known as:  PRILOSEC OTC Take 20 mg by mouth  daily.   PHENobarbital 32.4 MG tablet Commonly known as:  LUMINAL Take 7 tablets (226.8 mg total) by mouth at bedtime. What changed:    medication strength  how much to take  additional instructions  Another medication with the same name was removed. Continue taking this medication, and follow the directions you see here.   simvastatin 20 MG tablet Commonly known as:  ZOCOR Take 20 mg by mouth at bedtime.   tamsulosin 0.4 MG Caps capsule Commonly known as:  FLOMAX Take 0.4 mg by mouth at bedtime.   Vitamin D 50 MCG (2000 UT) tablet Take 2,000 Units by mouth  daily.        Brief H and P: For complete details please refer to admission H and P, but in brief 48 y.o.WM PMHxSeizures, CKD stage 3, presumed liver cirrhosis, HTN, hypertension,who presentedwith AMS. He washospitalized from March 8-21 and March 28 April 11 for acute metabolic encephalopathy secondary to septic shock from ESBL positive Klebsiella and E faecalis UTI with subsequent hypoxic respiratory failure in the setting of ESBL pneumonia treated with meropenem and requiring mechanical ventilation and CRRT during hospitalizations  ED Course:Encephalopathy, UTI, AKI. Complex patient, recently discharged. Worsening confusion, elevated creatinine and NH4 levels. Hypotensive on arrival, rectal temp 88, transiently hypoxic. Labs with UTI. Creatinine up to 8.11, BUN 64. Hgb is low but stable. CXR with vascular congestion, mild. Pharmacy suggested Cefepime and Vanc was reasonable. MRSA PCR is pending. Given mild IVF. BP 91/51. Has not given sepsis bolus. PCCM to evaluate to determine if admission to their service is needed and she will let me know.  Patient was admitted to PCCM with acute renal failure and metabolic acidosis. Received CRRT in ICU by renal. Renal function and acidosis improved.  Hospital Course:   Acute on chronic CKD stage III -Patient had presented with acute renal failure, metabolic acidosis, acute metabolic encephalopathy likely due to uremia -Baseline creatinine ~1.8, plateaued at 8.34, now back to 2.93 at the time of discharge, dialysis dependent -Nephrology was consulted, patient underwent CRRT in ICU, tolerated well with improvement in the kidney function -IR was consulted.  Temporary dialysis cath was placed on 05/30/2018.  Patient tolerated dialysis after the [facility] placement. -Outpatient dialysis center has been arranged at [facility], Monday Wednesday Friday schedule.  -Per nephrology, cleared for discharge today back to skilled nursing facility -Repeat  COVID-19 checked for SNF disposition on 05/30/2018 negative  Hypertension -BP soft, Norvasc discontinued  Anemia of chronic disease due to CKD -Continue ESA -Received packed RBC transfusion 1 unit on 4/27 and 2 units on 4/30 -Continue monitoring CBC outpatient, may need periodic blood transfusion, hemoglobin 7.3.  Acute metabolic acidosis secondary to #1 -Improved after hemodialysis  Acute metabolic encephalopathy -Likely due to #1, improving with HD, currently appears back to baseline  History of seizures -No evidence of seizure activity, patient on carbamazepine, phenobarbital Gabapentin discontinued Phenobarbital level was low hence dose increased to 226.8 mg at bedtime.  Please follow  phenobarbital level   History of liver cirrhosis Patient on rifaximin and lactulose at home however discontinued in the setting of normal-appearing liver on CT by CCM -Monitor ammonia level, most recent 58 on 5/4, lactulose added back  BPH Continue Flomax  Sepsis-ruled out Ruled out, blood cultures negative till date, urine culture negative COVID test on 4/25 and on 5/6 negative   Diabetes mellitus type 2 uncontrolled with CKD Hemoglobin A1c 8.3 CBGs have been trending down, patient on dialysis now Lantus decreased to 13 units  at bedtime, continue sliding scale insulin  Chronic thrombocytopenia Improving  Day of Discharge S: No complaints, eating breakfast, wants to get back to the facility  BP 130/73 (BP Location: Right Arm)   Pulse 64   Temp 99.4 F (37.4 C) (Oral)   Resp 16   Ht 6' 2"  (1.88 m)   Wt 115.1 kg   SpO2 95%   BMI 32.58 kg/m   Physical Exam: General: Alert and awake oriented x3 not in any acute distress. HEENT: anicteric sclera, pupils reactive to light and accommodation CVS: S1-S2 clear no murmur rubs or gallops Chest: clear to auscultation bilaterally, no wheezing rales or rhonchi Abdomen: soft nontender, nondistended, normal bowel  sounds Extremities: no cyanosis, clubbing or edema noted bilaterally Neuro: No new FND's   The results of significant diagnostics from this hospitalization (including imaging, microbiology, ancillary and laboratory) are listed below for reference.      Procedures/Studies:  Ct Abdomen Pelvis Wo Contrast  Result Date: 05/19/2018 CLINICAL DATA:  Increased creatinine and ammonia levels. Altered mental status. Slurred speech. EXAM: CT ABDOMEN AND PELVIS WITHOUT CONTRAST TECHNIQUE: Multidetector CT imaging of the abdomen and pelvis was performed following the standard protocol without IV contrast. COMPARISON:  07/09/2017 FINDINGS: Lower chest: Dependent atelectasis. Mild coronary artery calcification. Hepatobiliary: Postcholecystectomy. Unremarkable appearance of the liver. Pancreas: There is fatty stranding in an about the length of the pancreas. Scattered pancreatic parenchymal calcifications are present suggesting chronic pancreatitis. No obvious mass or ductal dilatation. Spleen: Unremarkable Adrenals/Urinary Tract: Prominent perinephric stranding bilaterally is similar to that seen on the prior study. Small punctate calculi throughout the collecting system of the left kidney. 1.4 cm calculus in the right renal pelvis. Adrenal glands are within normal limits. Foley catheter decompresses the bladder. Stomach/Bowel: No disproportionate dilatation of bowel. No obvious mass in the colon. Unremarkable stomach. Vascular/Lymphatic: Minimal atherosclerotic vascular calcifications. Several subcentimeter short axis diameter para-aortic lymph nodes. Reproductive: Unremarkable prostate. Other: There is no free fluid. There is stranding in the fat along the paracolic gutters and in the presacral space. There is also stranding in the subcutaneous fat. Musculoskeletal: No vertebral compression deformity. IMPRESSION: There is stranding within the fat in an about the pancreas suggesting an inflammatory process.  Correlation with amylase and lipase is recommended. Pancreatic calcifications are compatible with chronic pancreatitis. There is stable prominent bilateral perinephric stranding. These findings may represent a recurrent inflammatory process of the kidneys. Bilateral nephrolithiasis. Stranding within the retroperitoneal fat suggesting anasarca. Electronically Signed   By: Marybelle Killings M.D.   On: 05/19/2018 15:41   Ir Fluoro Guide Cv Line Right  Result Date: 05/30/2018 INDICATION: END-STAGE RENAL DISEASE EXAM: ULTRASOUND GUIDANCE FOR VASCULAR ACCESS RIGHT INTERNAL JUGULAR PERMANENT HEMODIALYSIS CATHETER Date:  05/30/2018 05/30/2018 4:12 pm Radiologist:  Jerilynn Mages. Daryll Brod, MD Guidance:  Ultrasound fluoroscopic FLUOROSCOPY TIME:  Fluoroscopy Time: 0 minutes 36 seconds (9 mGy). MEDICATIONS: 2 g Ancef administered within 1 hour the procedure ANESTHESIA/SEDATION: Moderate Sedation Time:  None. The patient was continuously monitored during the procedure by the interventional radiology nurse under my direct supervision. CONTRAST:  None. COMPLICATIONS: None immediate. PROCEDURE: Informed consent was obtained from the patient following explanation of the procedure, risks, benefits and alternatives. The patient understands, agrees and consents for the procedure. All questions were addressed. A time out was performed. Maximal barrier sterile technique utilized including caps, mask, sterile gowns, sterile gloves, large sterile drape, hand hygiene, and 2% chlorhexidine scrub. Under sterile conditions and local anesthesia, right internal jugular micropuncture venous access was  performed with ultrasound. Images were obtained for documentation. A guide wire was inserted followed by a transitional dilator. Next, a 0.035 guidewire was advanced into the IVC with a 5-French catheter. Measurements were obtained from the right venotomy site to the proximal right atrium. In the right infraclavicular chest, a subcutaneous tunnel was created  under sterile conditions and local anesthesia. 1% lidocaine with epinephrine was utilized for this. The 23 cm tip to cuff palindrome catheter was tunneled subcutaneously to the venotomy site and inserted into the SVC/RA junction through a valved peel-away sheath. Position was confirmed with fluoroscopy. Images were obtained for documentation. Blood was aspirated from the catheter followed by saline and heparin flushes. The appropriate volume and strength of heparin was instilled in each lumen. Caps were applied. The catheter was secured at the tunnel site with Gelfoam and a pursestring suture. The venotomy site was closed with subcuticular Vicryl suture. Dermabond was applied to the small right neck incision. A dry sterile dressing was applied. The catheter is ready for use. No immediate complications. IMPRESSION: Ultrasound and fluoroscopically guided right internal jugular tunneled hemodialysis catheter (23 cm tip to cuff palindrome catheter). Electronically Signed   By: Jerilynn Mages.  Shick M.D.   On: 05/30/2018 16:15   Ir US Guide Vasc Access Right  Result Date: 05/30/2018 INDICATION: END-STAGE RENAL DISEASE EXAM: ULTRASOUND GUIDANCE FOR VASCULAR ACCESS RIGHT INTERNAL JUGULAR PERMANENT HEMODIALYSIS CATHETER Date:  05/30/2018 05/30/2018 4:12 pm Radiologist:  Jerilynn Mages. Daryll Brod, MD Guidance:  Ultrasound fluoroscopic FLUOROSCOPY TIME:  Fluoroscopy Time: 0 minutes 36 seconds (9 mGy). MEDICATIONS: 2 g Ancef administered within 1 hour the procedure ANESTHESIA/SEDATION: Moderate Sedation Time:  None. The patient was continuously monitored during the procedure by the interventional radiology nurse under my direct supervision. CONTRAST:  None. COMPLICATIONS: None immediate. PROCEDURE: Informed consent was obtained from the patient following explanation of the procedure, risks, benefits and alternatives. The patient understands, agrees and consents for the procedure. All questions were addressed. A time out was performed. Maximal  barrier sterile technique utilized including caps, mask, sterile gowns, sterile gloves, large sterile drape, hand hygiene, and 2% chlorhexidine scrub. Under sterile conditions and local anesthesia, right internal jugular micropuncture venous access was performed with ultrasound. Images were obtained for documentation. A guide wire was inserted followed by a transitional dilator. Next, a 0.035 guidewire was advanced into the IVC with a 5-French catheter. Measurements were obtained from the right venotomy site to the proximal right atrium. In the right infraclavicular chest, a subcutaneous tunnel was created under sterile conditions and local anesthesia. 1% lidocaine with epinephrine was utilized for this. The 23 cm tip to cuff palindrome catheter was tunneled subcutaneously to the venotomy site and inserted into the SVC/RA junction through a valved peel-away sheath. Position was confirmed with fluoroscopy. Images were obtained for documentation. Blood was aspirated from the catheter followed by saline and heparin flushes. The appropriate volume and strength of heparin was instilled in each lumen. Caps were applied. The catheter was secured at the tunnel site with Gelfoam and a pursestring suture. The venotomy site was closed with subcuticular Vicryl suture. Dermabond was applied to the small right neck incision. A dry sterile dressing was applied. The catheter is ready for use. No immediate complications. IMPRESSION: Ultrasound and fluoroscopically guided right internal jugular tunneled hemodialysis catheter (23 cm tip to cuff palindrome catheter). Electronically Signed   By: Jerilynn Mages.  Shick M.D.   On: 05/30/2018 16:15   Dg Chest Port 1 View  Result Date: 05/21/2018 CLINICAL DATA:  Abnormal  respiration. EXAM: PORTABLE CHEST 1 VIEW COMPARISON:  Radiograph of May 19, 2018. FINDINGS: Stable cardiomegaly. Right internal jugular catheter is unchanged in position. No pneumothorax is noted. Left lung base is not included in  field-of-view. No definite pleural effusion is seen within the visualized lung fields. Bony thorax is unremarkable. No consolidative process is noted. IMPRESSION: Stable cardiomegaly.  No acute pulmonary abnormality seen. Electronically Signed   By: Marijo Conception M.D.   On: 05/21/2018 07:50   Dg Chest Port 1 View  Result Date: 05/19/2018 CLINICAL DATA:  Bedside central venous catheter placement. EXAM: PORTABLE CHEST 1 VIEW 8:55 p.m.: COMPARISON:  Portable chest x-ray earlier today at 10:03 a.m. and previously. FINDINGS: RIGHT jugular central venous catheter tip projects over the UPPER SVC. No evidence of pneumothorax or mediastinal hematoma. Near expiratory image which accounts for the bibasilar atelectasis. New mild perihilar airspace pulmonary edema since the examination earlier this morning. IMPRESSION: 1. RIGHT jugular central venous catheter tip projects over the SVC. No acute complicating features. 2. New mild perihilar airspace pulmonary edema since the examination earlier this morning. Electronically Signed   By: Evangeline Dakin M.D.   On: 05/19/2018 21:06   Dg Chest Port 1 View  Result Date: 05/19/2018 CLINICAL DATA:  Increased creatinine and ammonia levels. Surge speech. EXAM: PORTABLE CHEST 1 VIEW COMPARISON:  May 16, 2018 FINDINGS: Stable cardiomegaly. Mild pulmonary venous congestion without overt edema. No focal infiltrate. No pneumothorax. IMPRESSION: Cardiomegaly and pulmonary venous congestion. Electronically Signed   By: Dorise Bullion III M.D   On: 05/19/2018 10:34      LAB RESULTS: Basic Metabolic Panel: Recent Labs  Lab 05/28/18 0607  05/30/18 2234 05/31/18 0355  NA 138  139   < > 137 135  K 4.1  4.1   < > 3.5 3.4*  CL 113*  113*   < > 105 97*  CO2 17*  18*   < > 23 27  GLUCOSE 105*  105*   < > 146* 160*  BUN 33*  34*   < > 25* 10  CREATININE 6.98*  6.97*   < > 5.56* 2.93*  CALCIUM 7.5*  7.5*   < > 7.4* 7.4*  MG 2.6*  --   --   --   PHOS 8.4*   < > 5.9*  2.5   < > = values in this interval not displayed.   Liver Function Tests: Recent Labs  Lab 05/30/18 2234 05/31/18 0355  ALBUMIN 2.2* 2.2*   No results for input(s): LIPASE, AMYLASE in the last 168 hours. Recent Labs  Lab 05/27/18 1710 05/28/18 0626  AMMONIA 47* 58*   CBC: Recent Labs  Lab 05/30/18 0352 05/30/18 2234  WBC 5.1 2.2*  HGB 7.5* 7.3*  HCT 23.3* 22.8*  MCV 95.1 94.2  PLT 117* 123*   Cardiac Enzymes: No results for input(s): CKTOTAL, CKMB, CKMBINDEX, TROPONINI in the last 168 hours. BNP: Invalid input(s): POCBNP CBG: Recent Labs  Lab 05/31/18 0247 05/31/18 0742  GLUCAP 87 143*      Disposition and Follow-up: Discharge Instructions    Diet Carb Modified   Complete by:  As directed    Increase activity slowly   Complete by:  As directed        DISPOSITION: Skilled nursing facility   [place]. Schedule an appointment as soon as possible for a visit in 1 week(s).   Why:  or tele visit Contact information:  [place] [phone]            Time coordinating discharge:  35 minutes  Signed:   Estill Cotta M.D. [facility] 05/31/2018, 10:13 AM

## 2018-06-01 DIAGNOSIS — R0600 Dyspnea, unspecified: Secondary | ICD-10-CM | POA: Insufficient documentation

## 2018-06-01 DIAGNOSIS — K7469 Other cirrhosis of liver: Secondary | ICD-10-CM | POA: Insufficient documentation

## 2018-06-01 DIAGNOSIS — Z111 Encounter for screening for respiratory tuberculosis: Secondary | ICD-10-CM

## 2018-06-01 HISTORY — DX: Dyspnea, unspecified: R06.00

## 2018-06-01 HISTORY — DX: Other cirrhosis of liver: K74.69

## 2018-06-01 HISTORY — DX: Encounter for screening for respiratory tuberculosis: Z11.1

## 2018-06-06 DIAGNOSIS — D649 Anemia, unspecified: Secondary | ICD-10-CM

## 2018-06-08 DIAGNOSIS — R197 Diarrhea, unspecified: Secondary | ICD-10-CM

## 2018-06-08 HISTORY — DX: Diarrhea, unspecified: R19.7

## 2018-06-18 ENCOUNTER — Encounter (HOSPITAL_COMMUNITY): Payer: Self-pay | Admitting: Emergency Medicine

## 2018-06-18 ENCOUNTER — Other Ambulatory Visit: Payer: Self-pay

## 2018-06-18 ENCOUNTER — Emergency Department (HOSPITAL_COMMUNITY): Payer: Medicare Other

## 2018-06-18 ENCOUNTER — Inpatient Hospital Stay (HOSPITAL_COMMUNITY)
Admission: EM | Admit: 2018-06-18 | Discharge: 2018-06-25 | DRG: 871 | Disposition: A | Discharge status: Skilled Nursing Facility | Payer: Medicare Other | Source: Skilled Nursing Facility | Attending: Internal Medicine | Admitting: Internal Medicine

## 2018-06-18 DIAGNOSIS — Z993 Dependence on wheelchair: Secondary | ICD-10-CM

## 2018-06-18 DIAGNOSIS — Z992 Dependence on renal dialysis: Secondary | ICD-10-CM

## 2018-06-18 DIAGNOSIS — I120 Hypertensive chronic kidney disease with stage 5 chronic kidney disease or end stage renal disease: Secondary | ICD-10-CM | POA: Diagnosis present

## 2018-06-18 DIAGNOSIS — Z6830 Body mass index (BMI) 30.0-30.9, adult: Secondary | ICD-10-CM

## 2018-06-18 DIAGNOSIS — K7460 Unspecified cirrhosis of liver: Secondary | ICD-10-CM | POA: Diagnosis present

## 2018-06-18 DIAGNOSIS — Z20828 Contact with and (suspected) exposure to other viral communicable diseases: Secondary | ICD-10-CM | POA: Diagnosis present

## 2018-06-18 DIAGNOSIS — R509 Fever, unspecified: Secondary | ICD-10-CM | POA: Diagnosis present

## 2018-06-18 DIAGNOSIS — N186 End stage renal disease: Secondary | ICD-10-CM | POA: Diagnosis present

## 2018-06-18 DIAGNOSIS — E118 Type 2 diabetes mellitus with unspecified complications: Secondary | ICD-10-CM | POA: Diagnosis not present

## 2018-06-18 DIAGNOSIS — I10 Essential (primary) hypertension: Secondary | ICD-10-CM | POA: Diagnosis present

## 2018-06-18 DIAGNOSIS — D696 Thrombocytopenia, unspecified: Secondary | ICD-10-CM | POA: Diagnosis present

## 2018-06-18 DIAGNOSIS — R1084 Generalized abdominal pain: Secondary | ICD-10-CM

## 2018-06-18 DIAGNOSIS — N189 Chronic kidney disease, unspecified: Secondary | ICD-10-CM

## 2018-06-18 DIAGNOSIS — R7881 Bacteremia: Secondary | ICD-10-CM | POA: Diagnosis present

## 2018-06-18 DIAGNOSIS — Z87442 Personal history of urinary calculi: Secondary | ICD-10-CM

## 2018-06-18 DIAGNOSIS — Z794 Long term (current) use of insulin: Secondary | ICD-10-CM

## 2018-06-18 DIAGNOSIS — D631 Anemia in chronic kidney disease: Secondary | ICD-10-CM | POA: Diagnosis present

## 2018-06-18 DIAGNOSIS — A419 Sepsis, unspecified organism: Secondary | ICD-10-CM

## 2018-06-18 DIAGNOSIS — R569 Unspecified convulsions: Secondary | ICD-10-CM

## 2018-06-18 DIAGNOSIS — F17210 Nicotine dependence, cigarettes, uncomplicated: Secondary | ICD-10-CM | POA: Diagnosis present

## 2018-06-18 DIAGNOSIS — E876 Hypokalemia: Secondary | ICD-10-CM | POA: Diagnosis present

## 2018-06-18 DIAGNOSIS — A4159 Other Gram-negative sepsis: Principal | ICD-10-CM | POA: Diagnosis present

## 2018-06-18 DIAGNOSIS — E1165 Type 2 diabetes mellitus with hyperglycemia: Secondary | ICD-10-CM | POA: Diagnosis present

## 2018-06-18 DIAGNOSIS — K861 Other chronic pancreatitis: Secondary | ICD-10-CM | POA: Diagnosis present

## 2018-06-18 DIAGNOSIS — D72829 Elevated white blood cell count, unspecified: Secondary | ICD-10-CM

## 2018-06-18 DIAGNOSIS — D509 Iron deficiency anemia, unspecified: Secondary | ICD-10-CM | POA: Diagnosis present

## 2018-06-18 DIAGNOSIS — N10 Acute tubulo-interstitial nephritis: Secondary | ICD-10-CM | POA: Diagnosis present

## 2018-06-18 DIAGNOSIS — Z87440 Personal history of urinary (tract) infections: Secondary | ICD-10-CM

## 2018-06-18 DIAGNOSIS — E663 Overweight: Secondary | ICD-10-CM | POA: Diagnosis present

## 2018-06-18 DIAGNOSIS — N170 Acute kidney failure with tubular necrosis: Secondary | ICD-10-CM | POA: Diagnosis present

## 2018-06-18 DIAGNOSIS — N2581 Secondary hyperparathyroidism of renal origin: Secondary | ICD-10-CM | POA: Diagnosis present

## 2018-06-18 DIAGNOSIS — E1122 Type 2 diabetes mellitus with diabetic chronic kidney disease: Secondary | ICD-10-CM | POA: Diagnosis present

## 2018-06-18 DIAGNOSIS — K529 Noninfective gastroenteritis and colitis, unspecified: Secondary | ICD-10-CM | POA: Diagnosis present

## 2018-06-18 HISTORY — DX: Chronic kidney disease, unspecified: N18.9

## 2018-06-18 HISTORY — DX: Elevated white blood cell count, unspecified: D72.829

## 2018-06-18 HISTORY — DX: Fever, unspecified: R50.9

## 2018-06-18 HISTORY — DX: Anemia in chronic kidney disease: D63.1

## 2018-06-18 LAB — CBG MONITORING, ED: Glucose-Capillary: 373 mg/dL — ABNORMAL HIGH (ref 70–99)

## 2018-06-18 LAB — CBC WITH DIFFERENTIAL/PLATELET
Abs Immature Granulocytes: 0.07 10*3/uL (ref 0.00–0.07)
Basophils Absolute: 0 10*3/uL (ref 0.0–0.1)
Basophils Relative: 0 %
Eosinophils Absolute: 0.1 10*3/uL (ref 0.0–0.5)
Eosinophils Relative: 1 %
HCT: 28.2 % — ABNORMAL LOW (ref 39.0–52.0)
Hemoglobin: 9.1 g/dL — ABNORMAL LOW (ref 13.0–17.0)
Immature Granulocytes: 1 %
Lymphocytes Relative: 5 %
Lymphs Abs: 0.6 10*3/uL — ABNORMAL LOW (ref 0.7–4.0)
MCH: 30.2 pg (ref 26.0–34.0)
MCHC: 32.3 g/dL (ref 30.0–36.0)
MCV: 93.7 fL (ref 80.0–100.0)
Monocytes Absolute: 0.9 10*3/uL (ref 0.1–1.0)
Monocytes Relative: 7 %
Neutro Abs: 10.8 10*3/uL — ABNORMAL HIGH (ref 1.7–7.7)
Neutrophils Relative %: 86 %
Platelets: 143 10*3/uL — ABNORMAL LOW (ref 150–400)
RBC: 3.01 MIL/uL — ABNORMAL LOW (ref 4.22–5.81)
RDW: 15.9 % — ABNORMAL HIGH (ref 11.5–15.5)
WBC: 12.5 10*3/uL — ABNORMAL HIGH (ref 4.0–10.5)
nRBC: 0 % (ref 0.0–0.2)

## 2018-06-18 LAB — COMPREHENSIVE METABOLIC PANEL
ALT: 11 U/L (ref 0–44)
AST: 14 U/L — ABNORMAL LOW (ref 15–41)
Albumin: 2.1 g/dL — ABNORMAL LOW (ref 3.5–5.0)
Alkaline Phosphatase: 112 U/L (ref 38–126)
Anion gap: 18 — ABNORMAL HIGH (ref 5–15)
BUN: 52 mg/dL — ABNORMAL HIGH (ref 6–20)
CO2: 20 mmol/L — ABNORMAL LOW (ref 22–32)
Calcium: 7.5 mg/dL — ABNORMAL LOW (ref 8.9–10.3)
Chloride: 97 mmol/L — ABNORMAL LOW (ref 98–111)
Creatinine, Ser: 6.95 mg/dL — ABNORMAL HIGH (ref 0.61–1.24)
GFR calc Af Amer: 10 mL/min — ABNORMAL LOW (ref >60)
GFR calc non Af Amer: 9 mL/min — ABNORMAL LOW (ref >60)
Glucose, Bld: 418 mg/dL — ABNORMAL HIGH (ref 70–99)
Potassium: 3.5 mmol/L (ref 3.5–5.1)
Sodium: 135 mmol/L (ref 135–145)
Total Bilirubin: 0.7 mg/dL (ref 0.3–1.2)
Total Protein: 6.2 g/dL — ABNORMAL LOW (ref 6.5–8.1)

## 2018-06-18 LAB — URINALYSIS, MICROSCOPIC (REFLEX)
RBC / HPF: >50 RBC/hpf (ref 0–5)
WBC, UA: >50 WBC/hpf (ref 0–5)

## 2018-06-18 LAB — URINALYSIS, ROUTINE W REFLEX MICROSCOPIC

## 2018-06-18 LAB — PROTIME-INR
INR: 1.9 — ABNORMAL HIGH (ref 0.8–1.2)
Prothrombin Time: 21.9 seconds — ABNORMAL HIGH (ref 11.4–15.2)

## 2018-06-18 LAB — LIPASE, BLOOD: Lipase: 12 U/L (ref 11–51)

## 2018-06-18 LAB — LACTIC ACID, PLASMA: Lactic Acid, Venous: 1.1 mmol/L (ref 0.5–1.9)

## 2018-06-18 LAB — GLUCOSE, CAPILLARY
Glucose-Capillary: 392 mg/dL — ABNORMAL HIGH (ref 70–99)
Glucose-Capillary: 412 mg/dL — ABNORMAL HIGH (ref 70–99)

## 2018-06-18 LAB — SARS CORONAVIRUS 2 BY RT PCR (HOSPITAL ORDER, PERFORMED IN ~~LOC~~ HOSPITAL LAB): SARS Coronavirus 2: NEGATIVE

## 2018-06-18 MED ORDER — PHENOBARBITAL 32.4 MG PO TABS
226.8000 mg | ORAL_TABLET | Freq: Every day | ORAL | Status: DC
  Administered 2018-06-18 – 2018-06-24 (×7): 226.8 mg via ORAL
  Filled 2018-06-18 (×7): qty 7

## 2018-06-18 MED ORDER — ACETAMINOPHEN 325 MG PO TABS: 650.0000 mg | ORAL_TABLET | Freq: Four times a day (QID) | ORAL | Status: DC | PRN

## 2018-06-18 MED ORDER — VITAMIN D 25 MCG (1000 UNIT) PO TABS
2000.0000 [IU] | ORAL_TABLET | Freq: Every day | ORAL | Status: DC
  Administered 2018-06-19 – 2018-06-25 (×7): 2000 [IU] via ORAL
  Filled 2018-06-18 (×8): qty 2

## 2018-06-18 MED ORDER — GUAIFENESIN ER 600 MG PO TB12: 600.0000 mg | ORAL_TABLET | Freq: Two times a day (BID) | ORAL | Status: DC | PRN

## 2018-06-18 MED ORDER — MORPHINE SULFATE (PF) 2 MG/ML IV SOLN
1.0000 mg | INTRAVENOUS | Status: DC | PRN
  Administered 2018-06-20 – 2018-06-25 (×5): 2 mg via INTRAVENOUS
  Filled 2018-06-18 (×5): qty 1

## 2018-06-18 MED ORDER — SODIUM CHLORIDE 0.9 % IV SOLN
500.0000 mg | INTRAVENOUS | Status: DC
  Administered 2018-06-18 – 2018-06-24 (×7): 500 mg via INTRAVENOUS
  Filled 2018-06-18 (×8): qty 0.5

## 2018-06-18 MED ORDER — CALCIUM ACETATE (PHOS BINDER) 667 MG PO CAPS
667.0000 mg | ORAL_CAPSULE | Freq: Three times a day (TID) | ORAL | Status: DC
  Administered 2018-06-19 – 2018-06-25 (×19): 667 mg via ORAL
  Filled 2018-06-18 (×19): qty 1

## 2018-06-18 MED ORDER — CARBAMAZEPINE 100 MG PO CHEW
700.0000 mg | CHEWABLE_TABLET | Freq: Two times a day (BID) | ORAL | Status: DC
  Administered 2018-06-18 – 2018-06-25 (×14): 700 mg via ORAL
  Filled 2018-06-18 (×16): qty 7

## 2018-06-18 MED ORDER — LACTULOSE 10 GM/15ML PO SOLN
20.0000 g | ORAL | Status: DC
  Administered 2018-06-18 – 2018-06-25 (×3): 20 g via ORAL
  Filled 2018-06-18 (×4): qty 30

## 2018-06-18 MED ORDER — VANCOMYCIN VARIABLE DOSE PER UNSTABLE RENAL FUNCTION (PHARMACIST DOSING): Status: DC

## 2018-06-18 MED ORDER — PRO-STAT SUGAR FREE PO LIQD
30.0000 mL | Freq: Two times a day (BID) | ORAL | Status: DC
  Administered 2018-06-18 – 2018-06-24 (×12): 30 mL via ORAL
  Filled 2018-06-18 (×14): qty 30

## 2018-06-18 MED ORDER — HYDROCODONE-ACETAMINOPHEN 5-325 MG PO TABS
1.0000 | ORAL_TABLET | ORAL | Status: DC | PRN
  Administered 2018-06-18 – 2018-06-25 (×10): 1 via ORAL
  Filled 2018-06-18 (×12): qty 1

## 2018-06-18 MED ORDER — IOHEXOL 300 MG/ML  SOLN: 100.0000 mL | Freq: Once | INTRAMUSCULAR | Status: DC | PRN

## 2018-06-18 MED ORDER — PANCRELIPASE (LIP-PROT-AMYL) 12000-38000 UNITS PO CPEP
12000.0000 [IU] | ORAL_CAPSULE | Freq: Three times a day (TID) | ORAL | Status: DC
  Administered 2018-06-19 – 2018-06-25 (×19): 12000 [IU] via ORAL
  Filled 2018-06-18 (×19): qty 1

## 2018-06-18 MED ORDER — HEPARIN SODIUM (PORCINE) 5000 UNIT/ML IJ SOLN
5000.0000 [IU] | Freq: Three times a day (TID) | INTRAMUSCULAR | Status: DC
  Administered 2018-06-18 – 2018-06-24 (×14): 5000 [IU] via SUBCUTANEOUS
  Filled 2018-06-18 (×18): qty 1

## 2018-06-18 MED ORDER — ONDANSETRON HCL 4 MG/2ML IJ SOLN: 4.0000 mg | Freq: Four times a day (QID) | INTRAMUSCULAR | Status: DC | PRN

## 2018-06-18 MED ORDER — TAMSULOSIN HCL 0.4 MG PO CAPS
0.4000 mg | ORAL_CAPSULE | Freq: Every day | ORAL | Status: DC
  Administered 2018-06-18 – 2018-06-24 (×7): 0.4 mg via ORAL
  Filled 2018-06-18 (×7): qty 1

## 2018-06-18 MED ORDER — INSULIN ASPART 100 UNIT/ML ~~LOC~~ SOLN
0.0000 [IU] | Freq: Every day | SUBCUTANEOUS | Status: DC
  Administered 2018-06-19: 3 [IU] via SUBCUTANEOUS
  Administered 2018-06-20: 2 [IU] via SUBCUTANEOUS
  Administered 2018-06-21: 4 [IU] via SUBCUTANEOUS
  Administered 2018-06-22 – 2018-06-23 (×2): 2 [IU] via SUBCUTANEOUS

## 2018-06-18 MED ORDER — ONDANSETRON HCL 4 MG PO TABS: 4.0000 mg | ORAL_TABLET | Freq: Four times a day (QID) | ORAL | Status: DC | PRN

## 2018-06-18 MED ORDER — FERROUS SULFATE 325 (65 FE) MG PO TABS
325.0000 mg | ORAL_TABLET | Freq: Every day | ORAL | Status: DC
  Administered 2018-06-19 – 2018-06-24 (×6): 325 mg via ORAL
  Filled 2018-06-18 (×6): qty 1

## 2018-06-18 MED ORDER — VANCOMYCIN HCL 10 G IV SOLR
2500.0000 mg | Freq: Once | INTRAVENOUS | Status: AC
  Administered 2018-06-18: 2500 mg via INTRAVENOUS
  Filled 2018-06-18: qty 2500

## 2018-06-18 MED ORDER — RENA-VITE PO TABS
1.0000 | ORAL_TABLET | Freq: Every day | ORAL | Status: DC
  Administered 2018-06-18 – 2018-06-24 (×7): 1 via ORAL
  Filled 2018-06-18 (×7): qty 1

## 2018-06-18 MED ORDER — INSULIN ASPART 100 UNIT/ML ~~LOC~~ SOLN
12.0000 [IU] | Freq: Once | SUBCUTANEOUS | Status: AC
  Administered 2018-06-18: 12 [IU] via SUBCUTANEOUS

## 2018-06-18 MED ORDER — SENNOSIDES-DOCUSATE SODIUM 8.6-50 MG PO TABS: 1.0000 | ORAL_TABLET | Freq: Every evening | ORAL | Status: DC | PRN

## 2018-06-18 MED ORDER — SIMVASTATIN 20 MG PO TABS
20.0000 mg | ORAL_TABLET | Freq: Every day | ORAL | Status: DC
  Administered 2018-06-18 – 2018-06-24 (×7): 20 mg via ORAL
  Filled 2018-06-18 (×6): qty 1

## 2018-06-18 MED ORDER — LACTULOSE 10 GM/15ML PO SOLN
10.0000 g | ORAL | Status: DC
  Administered 2018-06-21 – 2018-06-24 (×3): 10 g via ORAL
  Filled 2018-06-18 (×4): qty 15

## 2018-06-18 MED ORDER — LACTULOSE 10 GM/15ML PO SOLN: 10.0000 g | Freq: Two times a day (BID) | ORAL | Status: DC

## 2018-06-18 MED ORDER — INSULIN ASPART 100 UNIT/ML ~~LOC~~ SOLN
0.0000 [IU] | Freq: Three times a day (TID) | SUBCUTANEOUS | Status: DC
  Administered 2018-06-19: 1 [IU] via SUBCUTANEOUS
  Administered 2018-06-19: 9 [IU] via SUBCUTANEOUS
  Administered 2018-06-19 – 2018-06-20 (×2): 7 [IU] via SUBCUTANEOUS
  Administered 2018-06-20: 3 [IU] via SUBCUTANEOUS
  Administered 2018-06-20 – 2018-06-21 (×2): 9 [IU] via SUBCUTANEOUS
  Administered 2018-06-21 – 2018-06-22 (×4): 3 [IU] via SUBCUTANEOUS
  Administered 2018-06-23: 5 [IU] via SUBCUTANEOUS
  Administered 2018-06-23: 1 [IU] via SUBCUTANEOUS
  Administered 2018-06-23: 2 [IU] via SUBCUTANEOUS
  Administered 2018-06-24: 3 [IU] via SUBCUTANEOUS
  Administered 2018-06-24 (×2): 2 [IU] via SUBCUTANEOUS
  Administered 2018-06-25: 1 [IU] via SUBCUTANEOUS
  Administered 2018-06-25: 2 [IU] via SUBCUTANEOUS

## 2018-06-18 MED ORDER — ACETAMINOPHEN 650 MG RE SUPP: 650.0000 mg | Freq: Four times a day (QID) | RECTAL | Status: DC | PRN

## 2018-06-18 MED ORDER — DARBEPOETIN ALFA 100 MCG/0.5ML IJ SOSY: 100.0000 ug | PREFILLED_SYRINGE | INTRAMUSCULAR | Status: DC

## 2018-06-18 MED ORDER — NEPRO/CARBSTEADY PO LIQD
237.0000 mL | Freq: Two times a day (BID) | ORAL | Status: DC
  Administered 2018-06-19 – 2018-06-25 (×10): 237 mL via ORAL
  Filled 2018-06-18 (×14): qty 237

## 2018-06-18 MED ORDER — CHLORHEXIDINE GLUCONATE CLOTH 2 % EX PADS
6.0000 | MEDICATED_PAD | Freq: Every day | CUTANEOUS | Status: DC
  Administered 2018-06-19 – 2018-06-24 (×4): 6 via TOPICAL

## 2018-06-18 MED ORDER — BASAGLAR KWIKPEN 100 UNIT/ML ~~LOC~~ SOPN: 13.0000 [IU] | PEN_INJECTOR | Freq: Every day | SUBCUTANEOUS | Status: DC

## 2018-06-18 MED ORDER — INSULIN GLARGINE 100 UNIT/ML ~~LOC~~ SOLN
15.0000 [IU] | Freq: Every day | SUBCUTANEOUS | Status: DC
  Administered 2018-06-18: 15 [IU] via SUBCUTANEOUS
  Filled 2018-06-18 (×2): qty 0.15

## 2018-06-18 MED ORDER — INSULIN ASPART 100 UNIT/ML ~~LOC~~ SOLN
3.0000 [IU] | Freq: Three times a day (TID) | SUBCUTANEOUS | Status: DC
  Administered 2018-06-19 (×2): 3 [IU] via SUBCUTANEOUS

## 2018-06-18 NOTE — ED Triage Notes (Signed)
Per [facility] pt coming from [facility], patient was seen at [facility] for N/V/D and sent back to nursing facility. Patient was sent from from dialysis with no treatment due to fever. Patient states hasnt eaten since 5pm yesterday.

## 2018-06-18 NOTE — Progress Notes (Signed)
Pharmacy Antibiotic Note  Eddie Davies is a 48 y.o. male admitted on 06/18/2018 with N/V, fever and left sided abdominal tenderness. Pharmacy has been consulted for meropenem and vancomycin dosing. Patient has PMH significant for recent history of ESBL (K. Pneumoniae from expectorated sputum on 04/22/18) and ESRD on HD MWF. Tmax 101.8, RR 29, HR 103, SCr 6.95, and WBC of 12.5.  Plan: Vancomycin 2500 mg IV x1, then 1000 mg IV after dialysis Meropenem 500 mg IV q24h F/u HD schedule Monitor CBC and clinical progression F/u plans for de-escalation of abx and LOT  Height: 6' 2"  (188 cm) Weight: 253 lb 12 oz (115.1 kg) IBW/kg (Calculated) : 82.2  Temp (24hrs), Avg:101.8 F (38.8 C), Min:101.8 F (38.8 C), Max:101.8 F (38.8 C)  Recent Labs  Lab 06/18/18 1310 06/18/18 1319  WBC  --  12.5*  CREATININE  --  6.95*  LATICACIDVEN 1.1  --     Estimated Creatinine Clearance: 17.7 mL/min (A) (by C-G formula based on SCr of 6.95 mg/dL (H)).    Allergies  Allergen Reactions  . Fish Oil Swelling and Other (See Comments)        Antimicrobials this admission: Meropenem 5/25 >>  Vancomycin 5/25 >>  Dose adjustments this admission: N/A  Microbiology results: 5/25 BCx:  5/25 Covid: negative  Thank you for allowing pharmacy to be a part of this patient's care.  Claiborne Billings, PharmD PGY2 Cardiology Pharmacy Resident Please check AMION for all Pharmacist numbers by unit 06/18/2018 4:45 PM

## 2018-06-18 NOTE — ED Notes (Signed)
Patient transported to CT 

## 2018-06-18 NOTE — ED Notes (Signed)
ED TO INPATIENT HANDOFF REPORT  ED Nurse Name and Phone #: Joellen Jersey [phone]  S Name/Age/Gender Eddie Davies 48 y.o. male Room/Bed: 021C/021C  Code Status   Code Status: Prior  Home/SNF/Other Nursing Home Patient oriented to: self, place, time and situation Is this baseline? Yes   Triage Complete: Triage complete  Chief Complaint poss covid  Triage Note Per [facility] pt coming from [facility], patient was seen at [facility] for N/V/D and sent back to nursing facility. Patient was sent from from dialysis with no treatment due to fever. Patient states hasnt eaten since 5pm yesterday.    Allergies Allergies  Allergen Reactions  . Fish Oil Swelling and Other (See Comments)         Level of Care/Admitting Diagnosis ED Disposition    ED Disposition Condition Comment   Admit  Hospital Area: [place] [[id number]]  Level of Care: Telemetry Medical [104]  I expect the patient will be discharged within 24 hours: No (not a candidate for 5C-Observation unit)  Covid Evaluation: N/A  Diagnosis: Fever [527782]  Admitting Physician: Aline August [[id number]]  Attending Physician: Aline August [[id number]]  PT Class (Do Not Modify): Observation [104]  PT Acc Code (Do Not Modify): Observation [10022]       B Medical/Surgery History Past Medical History:  Diagnosis Date  . Cirrhosis ([place]) 04/2018  . Diabetes mellitus without complication ([place])   . Liver failure ([place])   . Metabolic acidosis 08/6759  . Renal failure    Past Surgical History:  Procedure Laterality Date  . CHOLECYSTECTOMY    . IR FLUORO GUIDE CV LINE RIGHT  05/30/2018  . IR US GUIDE VASC ACCESS RIGHT  05/30/2018     A IV Location/Drains/Wounds Patient Lines/Drains/Airways Status   Active Line/Drains/Airways    Name:   Placement date:   Placement time:   Site:   Days:   Peripheral IV 06/18/18 Left Antecubital   06/18/18    1319    Antecubital   less than 1   Hemodialysis Catheter Right Internal  jugular Double-lumen;Permanent   05/30/18    1552    Internal jugular   19   Pressure Injury 04/21/18 Stage II -  Partial thickness loss of dermis presenting as a shallow open ulcer with a red, pink wound bed without slough. redness, possible previous injury   04/21/18    2200     58   Wound / Incision (Open or Dehisced) 04/13/18 Scrotum Posterior;Medial   04/13/18    1100    Scrotum   66          Intake/Output Last 24 hours  Intake/Output Summary (Last 24 hours) at 06/18/2018 1622 Last data filed at 06/18/2018 1502 Gross per 24 hour  Intake 100 ml  Output -  Net 100 ml    Labs/Imaging Results for orders placed or performed during the hospital encounter of 06/18/18 (from the past 48 hour(s))  CBG monitoring, ED     Status: Abnormal   Collection Time: 06/18/18  1:01 PM  Result Value Ref Range   Glucose-Capillary 373 (H) 70 - 99 mg/dL   Comment 1 Notify RN    Comment 2 Document in Chart   Lactic acid, plasma     Status: None   Collection Time: 06/18/18  1:10 PM  Result Value Ref Range   Lactic Acid, Venous 1.1 0.5 - 1.9 mmol/L    Comment: Performed at [facility], [place] [address] [place], [place] [address]  Comprehensive metabolic panel     Status: Abnormal   Collection Time: 06/18/18  1:19 PM  Result Value Ref Range   Sodium 135 135 - 145 mmol/L   Potassium 3.5 3.5 - 5.1 mmol/L   Chloride 97 (L) 98 - 111 mmol/L   CO2 20 (L) 22 - 32 mmol/L   Glucose, Bld 418 (H) 70 - 99 mg/dL   BUN 52 (H) 6 - 20 mg/dL   Creatinine, Ser 6.95 (H) 0.61 - 1.24 mg/dL   Calcium 7.5 (L) 8.9 - 10.3 mg/dL   Total Protein 6.2 (L) 6.5 - 8.1 g/dL   Albumin 2.1 (L) 3.5 - 5.0 g/dL   AST 14 (L) 15 - 41 U/L   ALT 11 0 - 44 U/L   Alkaline Phosphatase 112 38 - 126 U/L   Total Bilirubin 0.7 0.3 - 1.2 mg/dL   GFR calc non Af Amer 9 (L) >60 mL/min   GFR calc Af Amer 10 (L) >60 mL/min   Anion gap 18 (H) 5 - 15    Comment: Performed at [facility], [address], [place], [place] [address]   CBC with Differential     Status: Abnormal   Collection Time: 06/18/18  1:19 PM  Result Value Ref Range   WBC 12.5 (H) 4.0 - 10.5 K/uL   RBC 3.01 (L) 4.22 - 5.81 MIL/uL   Hemoglobin 9.1 (L) 13.0 - 17.0 g/dL   HCT 28.2 (L) 39.0 - 52.0 %   MCV 93.7 80.0 - 100.0 fL   MCH 30.2 26.0 - 34.0 pg   MCHC 32.3 30.0 - 36.0 g/dL   RDW 15.9 (H) 11.5 - 15.5 %   Platelets 143 (L) 150 - 400 K/uL   nRBC 0.0 0.0 - 0.2 %   Neutrophils Relative % 86 %   Neutro Abs 10.8 (H) 1.7 - 7.7 K/uL   Lymphocytes Relative 5 %   Lymphs Abs 0.6 (L) 0.7 - 4.0 K/uL   Monocytes Relative 7 %   Monocytes Absolute 0.9 0.1 - 1.0 K/uL   Eosinophils Relative 1 %   Eosinophils Absolute 0.1 0.0 - 0.5 K/uL   Basophils Relative 0 %   Basophils Absolute 0.0 0.0 - 0.1 K/uL   Immature Granulocytes 1 %   Abs Immature Granulocytes 0.07 0.00 - 0.07 K/uL   Acanthocytes PRESENT    Polychromasia PRESENT     Comment: Performed at [facility], [place] [address], [place], [place] [address]  Protime-INR     Status: Abnormal   Collection Time: 06/18/18  1:19 PM  Result Value Ref Range   Prothrombin Time 21.9 (H) 11.4 - 15.2 seconds   INR 1.9 (H) 0.8 - 1.2    Comment: (NOTE) INR goal varies based on device and disease states. Performed at [facility], [place] [address], [place], [place] [address]   Lipase, blood     Status: None   Collection Time: 06/18/18  1:34 PM  Result Value Ref Range   Lipase 12 11 - 51 U/L    Comment: Performed at [facility] [address], [place], [place] [address]  SARS Coronavirus 2 (CEPHEID- Performed in [facility]), Hosp Order     Status: None   Collection Time: 06/18/18  1:58 PM  Result Value Ref Range   SARS Coronavirus 2 NEGATIVE NEGATIVE    Comment: (NOTE) If result is NEGATIVE SARS-CoV-2 target nucleic acids are NOT DETECTED. The SARS-CoV-2 RNA is generally  detectable in upper and lower  respiratory specimens during the acute phase of infection. The lowest   concentration of SARS-CoV-2 viral copies this assay can detect is 250  copies / mL. A negative result does not preclude SARS-CoV-2 infection  and should not be used as the sole basis for treatment or other  patient management decisions.  A negative result may occur with  improper specimen collection / handling, submission of specimen other  than nasopharyngeal swab, presence of viral mutation(s) within the  areas targeted by this assay, and inadequate number of viral copies  (<250 copies / mL). A negative result must be combined with clinical  observations, patient history, and epidemiological information. If result is POSITIVE SARS-CoV-2 target nucleic acids are DETECTED. The SARS-CoV-2 RNA is generally detectable in upper and lower  respiratory specimens dur ing the acute phase of infection.  Positive  results are indicative of active infection with SARS-CoV-2.  Clinical  correlation with patient history and other diagnostic information is  necessary to determine patient infection status.  Positive results do  not rule out bacterial infection or co-infection with other viruses. If result is PRESUMPTIVE POSTIVE SARS-CoV-2 nucleic acids MAY BE PRESENT.   A presumptive positive result was obtained on the submitted specimen  and confirmed on repeat testing.  While 2019 novel coronavirus  (SARS-CoV-2) nucleic acids may be present in the submitted sample  additional confirmatory testing may be necessary for epidemiological  and / or clinical management purposes  to differentiate between  SARS-CoV-2 and other Sarbecovirus currently known to infect humans.  If clinically indicated additional testing with an alternate test  methodology [phone]) is advised. The SARS-CoV-2 RNA is generally  detectable in upper and lower respiratory sp ecimens during the acute  phase of infection. The expected result is Negative. Fact Sheet for Patients:  StrictlyIdeas.no Fact Sheet  for Healthcare Providers: BankingDealers.co.za This test is not yet approved or cleared by the [place] FDA and has been authorized for detection and/or diagnosis of SARS-CoV-2 by FDA under an Emergency Use Authorization (EUA).  This EUA will remain in effect (meaning this test can be used) for the duration of the COVID-19 declaration under Section 564(b)(1) of the Act, 21 U.S.C. section 360bbb-3(b)(1), unless the authorization is terminated or revoked sooner. Performed at [facility], [place] [address] [place], [place] [address]    Ct Abdomen Pelvis Wo Contrast  Result Date: 06/18/2018 CLINICAL DATA:  Abdominal pain, fever, abscess suspected EXAM: CT ABDOMEN AND PELVIS WITHOUT CONTRAST TECHNIQUE: Multidetector CT imaging of the abdomen and pelvis was performed following the standard protocol without IV contrast. COMPARISON:  05/19/2018 FINDINGS: Lower chest: No acute abnormality.  Artery calcifications. Hepatobiliary: Coarse contour of the liver. Status post cholecystectomy. No biliary dilatation. Pancreas: Pancreatic parenchymal calcifications and atrophy. No pancreatic ductal dilatation or surrounding inflammatory changes. Spleen: Splenomegaly, maximum span approximately 18.7 cm. Adrenals/Urinary Tract: Adrenal glands are unremarkable. There is extensive perirenal and adjacent retroperitoneal fat stranding, similar in appearance to prior examination. Unchanged 10 mm calculus in the right renal pelvis and punctuate nonobstructive calculi in the left kidney. No hydronephrosis. Mild thickening and fat stranding about the urinary bladder. Stomach/Bowel: Stomach is within normal limits. Appendix is not clearly visualized and may be surgically absent. No evidence of bowel wall thickening, distention, or inflammatory changes. Vascular/Lymphatic: No significant vascular findings are present. No enlarged abdominal or pelvic lymph nodes. Reproductive: No mass or other  abnormality. Other: No abdominal wall hernia or abnormality. Evidence of prior  umbilical hernia repair. No abdominopelvic ascites. Musculoskeletal: No acute or significant osseous findings. IMPRESSION: 1. There is extensive perirenal and adjacent retroperitoneal fat stranding, similar in appearance to prior examination. Unchanged 10 mm calculus in the right renal pelvis and punctuate nonobstructive calculi in the left kidney. No hydronephrosis. Mild thickening and fat stranding about the urinary bladder. This constellation of findings is similar to prior examination and may reflect recurrent or ongoing infectious or inflammatory pyelonephritis/cystitis. Correlate with urinalysis. 2.  Stigmata of cirrhosis and splenomegaly. 3.  Stigmata of chronic pancreatitis. Electronically Signed   By: Eddie Candle M.D.   On: 06/18/2018 15:54   Dg Chest Port 1 View  Result Date: 06/18/2018 CLINICAL DATA:  Fever, vomiting, shortness of breath. EXAM: PORTABLE CHEST 1 VIEW COMPARISON:  Chest x-ray from earlier same day. Chest x-ray dated 05/21/2018. FINDINGS: Heart size is upper normal. Lungs are clear. No pleural effusion or pneumothorax seen. Dialysis catheter is stable in position with tip at the level of the cavoatrial junction. Osseous structures about the chest are unremarkable. IMPRESSION: No active disease. No evidence of pneumonia or pulmonary edema. Electronically Signed   By: Franki Cabot M.D.   On: 06/18/2018 13:58    Pending Labs Unresulted Labs (From admission, onward)    Start     Ordered   06/18/18 1319  Lactic acid, plasma  Now then every 2 hours,   STAT     06/18/18 1318   06/18/18 1319  Culture, blood (Routine x 2)  BLOOD CULTURE X 2,   STAT     06/18/18 1318          Vitals/Pain Today's Vitals   06/18/18 1400 06/18/18 1430 06/18/18 1500 06/18/18 1554  BP: 107/66 128/84 131/85 133/77  Pulse: 100 98 87 90  Resp: (!) 29 (!) 29 17 19   Temp:      TempSrc:      SpO2: 92% 92% 92% 96%  Weight:       Height:      PainSc:        Isolation Precautions No active isolations  Medications Medications  meropenem (MERREM) 500 mg in sodium chloride 0.9 % 100 mL IVPB (0 mg Intravenous Stopped 06/18/18 1502)  iohexol (OMNIPAQUE) 300 MG/ML solution 100 mL (has no administration in time range)    Mobility non-ambulatory High fall risk   Focused Assessments Pulmonary Assessment Handoff:  Lung sounds: L Breath Sounds: Diminished R Breath Sounds: Diminished          R Recommendations: See Admitting Provider Note  Report given to:   Additional Notes:

## 2018-06-18 NOTE — ED Notes (Signed)
Admitting at bedside 

## 2018-06-18 NOTE — H&P (Addendum)
History and Physical    Eddie Davies:[id number] DOB: Jun 11, 1970 DOA: 06/18/2018  PCP: Practice, [facility]   Patient coming from: Home  I have personally briefly reviewed patient's old medical records in [place]  Chief Complaint: Fever with abdominal pain  HPI: Eddie Davies is a 48 y.o. male with medical history significant of end-stage renal disease on dialysis since 05/24/2018, bilateral nonobstructive nephrolithiasis, chronic pancreatitis, diabetes mellitus type 2, cirrhosis of liver, thrombocytopenia, anemia, tobacco abuse, hospitalization in March 2020 with septic shock with ESBL and Klebsiella and E faecalis UTI along with VDRF with subsequent admission from 05/19/2018-05/31/2018 for acute renal failure for which he had to be subsequently started on dialysis presented today with fever and abdominal pain.  Patient states that he has been having abdominal pain for the last 3 to 4 days, mostly on the left side along with nausea, vomiting and diarrhea.  He is a poor historian and does not elaborate the pain further.  States that it is intermittently very severe and 10 out of 10.  His last dialysis was last Friday.  He went to dialysis today and was noted to be febrile and he was referred to the emergency department.  He had an ED visit at [facility] earlier for hyperglycemia and was found to have leukocytosis of 15,000 but then he was subsequently discharged back to his facility.  Patient was subsequently sent from dialysis unit for fever.  He denies cough or worsening shortness of breath.  No new rash.  No recent travel.  Patient still makes very little intermittent urine.  No hematochezia or melena.  No loss of consciousness or seizures.    ED Course: White count was found to be 12.5.  CT of the abdomen and pelvis showed findings of infectious or inflammatory pyelonephritis/cystitis, similar to prior exam along with findings suggestive of cirrhosis and chronic  pancreatitis.  No hydronephrosis.  He was given broad-spectrum antibiotics.  Hospitalist service was called to evaluate the patient.  Review of Systems: Could not be adequately reviewed because of patient being a poor historian and not providing accurate history.  Past Medical History:  Diagnosis Date   Cirrhosis ([place]) 04/2018   Diabetes mellitus without complication ([place])    Liver failure ([place])    Metabolic acidosis 81/1914   Renal failure     Past Surgical History:  Procedure Laterality Date   CHOLECYSTECTOMY     IR FLUORO GUIDE CV LINE RIGHT  05/30/2018   IR US GUIDE VASC ACCESS RIGHT  05/30/2018   Social history  reports that he has been smoking cigarettes. He has never used smokeless tobacco. He reports previous alcohol use. He reports that he does not use drugs.  Currently lives in a rehab facility.  Allergies  Allergen Reactions   Fish Oil Swelling and Other (See Comments)         Family history could not be obtained because of patient being a poor historian.  Prior to Admission medications   Medication Sig Start Date End Date Taking? Authorizing Provider  acetaminophen (TYLENOL) 325 MG tablet Take 650 mg by mouth every 8 (eight) hours as needed for fever (pain).   Yes [provider]  Amino Acids-Protein Hydrolys (FEEDING SUPPLEMENT, PRO-STAT SUGAR FREE 64,) LIQD Take 30 mLs by mouth 2 (two) times a day.   Yes [provider]  calcium acetate (PHOSLO) 667 MG capsule Take 667 mg by mouth 3 (three) times daily with meals.   Yes  [provider]  carbamazepine (TEGRETOL) 100 MG chewable tablet Chew 100 mg by mouth 2 (two) times daily. Take with 600 mg for a total dose of 700 mg.   Yes [provider]  carbamazepine (TEGRETOL) 200 MG tablet Take 600 mg by mouth 2 (two) times daily. Take with 153m for a total dose of 707m   Yes [provider]  Cholecalciferol (VITAMIN D) 50 MCG (2000 UT) tablet Take 2,000 Units by mouth  daily.   Yes [provider]  ferrous sulfate 325 (65 FE) MG tablet Take 325 mg by mouth daily with breakfast.   Yes [provider]  guaiFENesin (MUCINEX) 600 MG 12 hr tablet Take 600 mg by mouth every 12 (twelve) hours as needed for cough (congestion).   Yes [provider]  hydrocerin (EUCERIN) CREA Apply 1 application topically 2 (two) times daily. Apply to body 04/30/18  Yes [name], ShHenreitta LeberMD  insulin aspart (NOVOLOG) 100 UNIT/ML injection 0-15 Units, Subcutaneous, 3 times daily with meals CBG < 70: implement hypoglycemia protocol CBG 70 - 120: 0 units CBG 121 - 150: 2 units CBG 151 - 200: 3 units CBG 201 - 250: 5 units CBG 251 - 300: 8 units CBG 301 - 350: 11 units CBG 351 - 400: 15 units CBG > 400: call MD 04/30/18  Yes [name], ShHenreitta LeberMD  Insulin Glargine (BASAGLAR KWIKPEN) 100 UNIT/ML SOPN Inject 0.13 mLs (13 Units total) into the skin at bedtime. 05/31/18  Yes [name], [name] K, MD  lactulose (CHRONULAC) 10 GM/15ML solution Take 15 mLs (10 g total) by mouth 2 (two) times daily. Patient taking differently: Take 10-20 g by mouth See admin instructions. Give 10g twice daily on TUE, THU, SAT, SUN and 20g once daily on MON, WED, FRI. 05/31/18  Yes [name], [name] K, MD  multivitamin (RENA-VIT) TABS tablet Take 1 tablet by mouth at bedtime. 04/30/18  Yes [name], ShHenreitta LeberMD  Nutritional Supplements (FEEDING SUPPLEMENT, NEPRO CARB STEADY,) LIQD Take 237 mLs by mouth 2 (two) times daily between meals. 04/30/18  Yes [name], ShHenreitta LeberMD  omeprazole (PRILOSEC OTC) 20 MG tablet Take 20 mg by mouth daily.    Yes [provider]  PHENobarbital (LUMINAL) 32.4 MG tablet Take 7 tablets (226.8 mg total) by mouth at bedtime. 05/31/18  Yes [name], [name] K, MD  simvastatin (ZOCOR) 20 MG tablet Take 20 mg by mouth at bedtime.    Yes [provider]  tamsulosin (FLOMAX) 0.4 MG CAPS capsule Take 0.4 mg by mouth at bedtime.   Yes [provider]     Physical Exam: Vitals:   06/18/18 1500 06/18/18 1554 06/18/18 1600 06/18/18 1630  BP: 131/85 133/77 120/74 94/79  Pulse: 87 90 94 89  Resp: 17 19 (!) 27 12  Temp:      TempSrc:      SpO2: 92% 96% 95% 98%  Weight:      Height:        Constitutional: NAD, calm, comfortable.  Looks older than stated age.  Poor historian. Vitals:   06/18/18 1500 06/18/18 1554 06/18/18 1600 06/18/18 1630  BP: 131/85 133/77 120/74 94/79  Pulse: 87 90 94 89  Resp: 17 19 (!) 27 12  Temp:      TempSrc:      SpO2: 92% 96% 95% 98%  Weight:      Height:       Eyes: PERRL, lids and conjunctivae normal ENMT: Mucous membranes are dry. Posterior pharynx  clear of any exudate or lesions. Neck: normal, supple, no masses, no thyromegaly Respiratory: bilateral decreased breath sounds at bases, no wheezing; some scattered crackles.  Normal respiratory effort. No accessory muscle use. Right upper chest wall has vascular catheter in place with clean dry dressing. Cardiovascular: S1 S2 positive, rate controlled.  Trace lower extremity edema.. 2+ pedal pulses.  Abdomen: Soft, slightly distended, left upper quadrant tenderness present, no rebound tenderness, no masses palpated. No hepatosplenomegaly. Bowel sounds positive.  Musculoskeletal: no clubbing / cyanosis. No joint deformity upper and lower extremities.  Skin: Thickened lower extremity skin with chronic skin changes and flaking. Neurologic: Alert and awake but poor historian.  CN 2-12 grossly intact. Moving extremities. No focal neurologic deficits.  Psychiatric: Flat affect.  Could not be properly examined because patient being poor historian.   Labs on Admission: I have personally reviewed following labs and imaging studies  CBC: Recent Labs  Lab 06/18/18 1319  WBC 12.5*  NEUTROABS 10.8*  HGB 9.1*  HCT 28.2*  MCV 93.7  PLT 657*   Basic Metabolic Panel: Recent Labs  Lab 06/18/18 1319  NA 135  K 3.5  CL 97*  CO2 20*  GLUCOSE 418*  BUN  52*  CREATININE 6.95*  CALCIUM 7.5*   GFR: Estimated Creatinine Clearance: 17.7 mL/min (A) (by C-G formula based on SCr of 6.95 mg/dL (H)). Liver Function Tests: Recent Labs  Lab 06/18/18 1319  AST 14*  ALT 11  ALKPHOS 112  BILITOT 0.7  PROT 6.2*  ALBUMIN 2.1*   Recent Labs  Lab 06/18/18 1334  LIPASE 12   No results for input(s): AMMONIA in the last 168 hours. Coagulation Profile: Recent Labs  Lab 06/18/18 1319  INR 1.9*   Cardiac Enzymes: No results for input(s): CKTOTAL, CKMB, CKMBINDEX, TROPONINI in the last 168 hours. BNP (last 3 results) No results for input(s): PROBNP in the last 8760 hours. HbA1C: No results for input(s): HGBA1C in the last 72 hours. CBG: Recent Labs  Lab 06/18/18 1301  GLUCAP 373*   Lipid Profile: No results for input(s): CHOL, HDL, LDLCALC, TRIG, CHOLHDL, LDLDIRECT in the last 72 hours. Thyroid Function Tests: No results for input(s): TSH, T4TOTAL, FREET4, T3FREE, THYROIDAB in the last 72 hours. Anemia Panel: No results for input(s): VITAMINB12, FOLATE, FERRITIN, TIBC, IRON, RETICCTPCT in the last 72 hours. Urine analysis:    Component Value Date/Time   COLORURINE AMBER (A) 05/19/2018 0941   APPEARANCEUR HAZY (A) 05/19/2018 0941   LABSPEC 1.014 05/19/2018 0941   PHURINE 5.0 05/19/2018 0941   GLUCOSEU 50 (A) 05/19/2018 0941   HGBUR SMALL (A) 05/19/2018 0941   BILIRUBINUR NEGATIVE 05/19/2018 0941   KETONESUR NEGATIVE 05/19/2018 0941   PROTEINUR 30 (A) 05/19/2018 0941   NITRITE NEGATIVE 05/19/2018 0941   LEUKOCYTESUR MODERATE (A) 05/19/2018 0941    Radiological Exams on Admission: Ct Abdomen Pelvis Wo Contrast  Result Date: 06/18/2018 CLINICAL DATA:  Abdominal pain, fever, abscess suspected EXAM: CT ABDOMEN AND PELVIS WITHOUT CONTRAST TECHNIQUE: Multidetector CT imaging of the abdomen and pelvis was performed following the standard protocol without IV contrast. COMPARISON:  05/19/2018 FINDINGS: Lower chest: No acute abnormality.   Artery calcifications. Hepatobiliary: Coarse contour of the liver. Status post cholecystectomy. No biliary dilatation. Pancreas: Pancreatic parenchymal calcifications and atrophy. No pancreatic ductal dilatation or surrounding inflammatory changes. Spleen: Splenomegaly, maximum span approximately 18.7 cm. Adrenals/Urinary Tract: Adrenal glands are unremarkable. There is extensive perirenal and adjacent retroperitoneal fat stranding, similar in appearance to prior examination. Unchanged 10 mm calculus  in the right renal pelvis and punctuate nonobstructive calculi in the left kidney. No hydronephrosis. Mild thickening and fat stranding about the urinary bladder. Stomach/Bowel: Stomach is within normal limits. Appendix is not clearly visualized and may be surgically absent. No evidence of bowel wall thickening, distention, or inflammatory changes. Vascular/Lymphatic: No significant vascular findings are present. No enlarged abdominal or pelvic lymph nodes. Reproductive: No mass or other abnormality. Other: No abdominal wall hernia or abnormality. Evidence of prior umbilical hernia repair. No abdominopelvic ascites. Musculoskeletal: No acute or significant osseous findings. IMPRESSION: 1. There is extensive perirenal and adjacent retroperitoneal fat stranding, similar in appearance to prior examination. Unchanged 10 mm calculus in the right renal pelvis and punctuate nonobstructive calculi in the left kidney. No hydronephrosis. Mild thickening and fat stranding about the urinary bladder. This constellation of findings is similar to prior examination and may reflect recurrent or ongoing infectious or inflammatory pyelonephritis/cystitis. Correlate with urinalysis. 2.  Stigmata of cirrhosis and splenomegaly. 3.  Stigmata of chronic pancreatitis. Electronically Signed   By: Eddie Candle M.D.   On: 06/18/2018 15:54   Dg Chest Port 1 View  Result Date: 06/18/2018 CLINICAL DATA:  Fever, vomiting, shortness of breath.  EXAM: PORTABLE CHEST 1 VIEW COMPARISON:  Chest x-ray from earlier same day. Chest x-ray dated 05/21/2018. FINDINGS: Heart size is upper normal. Lungs are clear. No pleural effusion or pneumothorax seen. Dialysis catheter is stable in position with tip at the level of the cavoatrial junction. Osseous structures about the chest are unremarkable. IMPRESSION: No active disease. No evidence of pneumonia or pulmonary edema. Electronically Signed   By: Franki Cabot M.D.   On: 06/18/2018 13:58    Assessment/Plan Principal Problem:   Fever Active Problems:   Seizures ([place])   Benign essential HTN   Diabetes mellitus type 2, uncontrolled, with complications ([facility])   Thrombocytopenia ([facility])   ESRD (end stage renal disease) on dialysis ([place])   Anemia due to chronic kidney disease   Leukocytosis   Fever Leukocytosis Probable acute pyonephritis/cystitis -Questionable cause.  Patient presents with left-sided abdominal pain with nausea, vomiting. 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 -Fever but can be secondary to dialysis catheter infection causing bacteremia along with CT evidence of cystitis/pyelonephritis although patient states that he only makes intermittent urine.  Urinalysis is pending.  Will follow cultures.  Will cover for gram-negative and gram-positive's at this time.  Patient was given meropenem in the ED which will be continued.  Will also add vancomycin to cover for MRSA.  Once blood cultures are negative for MRSA  -COVID-19 initial testing negative so far.  Chest x-ray is negative for infiltrates.  Patient is not short of breath or hypoxic.  No recent evidence of cough or worsening shortness of breath. -Repeat a.m. labs  Abdominal pain in a patient with chronic pancreatitis -Unclear if this is secondary to chronic pancreatitis flare.  Lipase normal. -Pain management.  Will add pancreatic supplement.  End-stage renal  disease on hemodialysis -Nephrology has been consulted by ED.  Continue dialysis as per nephrology schedule.  Currently has right IJ tunnel catheter for hemodialysis with no evidence of erythema or drainage around the site.  Diabetes mellitus type 2 uncontrolled with hyperglycemia -Follow hemoglobin A1c.  A1c was 8.3 in March continue long-acting insulin along with NovoLog with meals and CBG with insulin sliding scale coverage  Anemia of chronic kidney disease along with iron deficiency anemia -Hemoglobin  stable.  Continue iron supplementation.  Monitor  Chronic thrombocytopenia -May be secondary to cirrhosis of liver.  Monitor.  No signs of bleeding.  Cirrhosis of liver -No signs of ascites on CT of the abdomen.  On lactulose which causes chronic diarrhea.  Outpatient follow-up with GI.  History of seizures -No recent history of seizures.  Continue current home regimen.  Generalized deconditioning -PT eval.    DVT prophylaxis: Heparin subcutaneous Code Status: Full Family Communication: None at bedside Disposition Plan: Will need to return back to SNF once medically stable in the next 1 to 2 days. Consults called: Nephrology called by ED provider Admission status: Observation/telemetry  Severity of Illness: The appropriate patient status for this patient is OBSERVATION. Observation status is judged to be reasonable and necessary in order to provide the required intensity of service to ensure the patient's safety. The patient's presenting symptoms, physical exam findings, and initial radiographic and laboratory data in the context of their medical condition is felt to place them at decreased risk for further clinical deterioration. Furthermore, it is anticipated that the patient will be medically stable for discharge from the hospital within 2 midnights of admission. The following factors support the patient status of observation.   " The patient's presenting symptoms include  fever/abdominal pain and vomiting. " The physical exam findings include left-sided abdominal tenderness. " The initial radiographic and laboratory data are leukocytosis less pyelonephritis/chronic pancreatitis.      Aline August MD [facility]  06/18/2018, 5:02 PM

## 2018-06-18 NOTE — Consult Note (Addendum)
[facility] Renal Consultation Note    Indication for Consultation:  Management of ESRD/hemodialysis; anemia, hypertension/volume and secondary hyperparathyroidism PCP: [place]  HPI: Eddie Davies is a 48 y.o. male with AKI secondary to ATN on CKD dialysis dependent ( MWF)  since 4/30- ( baseline Cr 1.8 up to 8 ) who has been dialyzing in [place] since his discharge earlier this month on a MWF schedule. He has a history of bilateral nonobstructive nephrolithiasis, chronic pancreatitis, DM, cirrhosis, thrombocytopeniaFe def anemia, tobacco abuse. He was also admitted in March with septic shock for ESBL + klebsiella and E. Faecalis UTI and VDRF.   He has been compliant with outpatient dialysis today and presented today with fever 100.2 and complaints of sore throat, cough and mild SOB.  He was sent to [facility] ED for evaluation.  Work up included negative CXR, negative COVID test. Abdominal CT showed  Stigmata of cirrhosis, splenomegaly and chroni pancreatitis as well as fings that could suggest ongoing infections or inflammatory pyelo/cytsitis., WBC 12.5 with 86% polys, INR 1.9 (not on coumadin) hgb 9.1 .  BUN 52 Cr 5.95 Ca 7.5 alb 2.1 normal LFTs, K 3.5 Na 135 Glu 418.  Review of records at dialysis show Cr 4.62 5/18  Most recent hgb 9. He resides at [facility].    At present, he reports N and V , since Friday with abdominal pain. Has chronic diarrhea Hasn't eaten much of anything.  RN from his HD unit told me the SNF had sent him to [facility] earlier today/late yesterday and he was sent back to the SNF.  He had a temp in the 99s there and WBC 15 K per RN. He is weak, nonambulatory. Have urine specimen which he said was red.  He denies SOB, CP, cough.or any significant dysuria.  He reports that his skin condition runs in his family. He is nonambulatory.   Past Medical History:  Diagnosis Date  . Cirrhosis ([place]) 04/2018  . Diabetes mellitus without  complication ([place])   . Liver failure ([place])   . Metabolic acidosis 25/0539  . Renal failure    Past Surgical History:  Procedure Laterality Date  . CHOLECYSTECTOMY    . IR FLUORO GUIDE CV LINE RIGHT  05/30/2018  . IR US GUIDE VASC ACCESS RIGHT  05/30/2018   No family history on file. Social History:  reports that he has been smoking cigarettes. He has never used smokeless tobacco. He reports previous alcohol use. He reports that he does not use drugs. Allergies  Allergen Reactions  . Fish Oil Swelling and Other (See Comments)        Prior to Admission medications   Medication Sig Start Date End Date Taking? Authorizing Provider  acetaminophen (TYLENOL) 325 MG tablet Take 650 mg by mouth every 8 (eight) hours as needed for fever (pain).    [provider]  carbamazepine (TEGRETOL) 100 MG chewable tablet Chew 100 mg by mouth 2 (two) times daily. Take with 600 mg for a total dose of 700 mg.    [provider]  carbamazepine (TEGRETOL) 200 MG tablet Take 600 mg by mouth 2 (two) times daily.    [provider]  Cholecalciferol (VITAMIN D) 50 MCG (2000 UT) tablet Take 2,000 Units by mouth daily.    [provider]  ferrous sulfate 325 (65 FE) MG tablet Take 325 mg by mouth daily with breakfast.    [provider]  guaiFENesin (MUCINEX) 600 MG 12  hr tablet Take 600 mg by mouth every 12 (twelve) hours as needed for cough (congestion).    [provider]  hydrocerin (EUCERIN) CREA Apply 1 application topically 2 (two) times daily. Apply to body 04/30/18   [name], [name], MD  insulin aspart (NOVOLOG) 100 UNIT/ML injection 0-15 Units, Subcutaneous, 3 times daily with meals CBG < 70: implement hypoglycemia protocol CBG 70 - 120: 0 units CBG 121 - 150: 2 units CBG 151 - 200: 3 units CBG 201 - 250: 5 units CBG 251 - 300: 8 units CBG 301 - 350: 11 units CBG 351 - 400: 15 units CBG > 400: call MD 04/30/18   [name], MD  Insulin  Glargine (BASAGLAR KWIKPEN) 100 UNIT/ML SOPN Inject 0.13 mLs (13 Units total) into the skin at bedtime. 05/31/18   [name], [name] K, MD  lactulose (CHRONULAC) 10 GM/15ML solution Take 15 mLs (10 g total) by mouth 2 (two) times daily. 05/31/18   [name], Vernelle Emerald, MD  multivitamin (RENA-VIT) TABS tablet Take 1 tablet by mouth at bedtime. 04/30/18   [name], [name], MD  Nutritional Supplements (FEEDING SUPPLEMENT, NEPRO CARB STEADY,) LIQD Take 237 mLs by mouth 2 (two) times daily between meals. 04/30/18   [name], [name], MD  omeprazole (PRILOSEC OTC) 20 MG tablet Take 20 mg by mouth daily.     [provider]  PHENobarbital (LUMINAL) 32.4 MG tablet Take 7 tablets (226.8 mg total) by mouth at bedtime. 05/31/18   [name], Vernelle Emerald, MD  simvastatin (ZOCOR) 20 MG tablet Take 20 mg by mouth at bedtime.     [provider]  tamsulosin (FLOMAX) 0.4 MG CAPS capsule Take 0.4 mg by mouth at bedtime.    [provider]   Current Facility-Administered Medications  Medication Dose Route Frequency Provider Last Rate Last Dose  . iohexol (OMNIPAQUE) 300 MG/ML solution 100 mL  100 mL Intravenous Once PRN Quintella Reichert, MD      . meropenem [facility]) 500 mg in sodium chloride 0.9 % 100 mL IVPB  500 mg Intravenous Q24H Quintella Reichert, MD   Stopped at 06/18/18 1502   Current Outpatient Medications  Medication Sig Dispense Refill  . acetaminophen (TYLENOL) 325 MG tablet Take 650 mg by mouth every 8 (eight) hours as needed for fever (pain).    . carbamazepine (TEGRETOL) 100 MG chewable tablet Chew 100 mg by mouth 2 (two) times daily. Take with 600 mg for a total dose of 700 mg.    . carbamazepine (TEGRETOL) 200 MG tablet Take 600 mg by mouth 2 (two) times daily.    . Cholecalciferol (VITAMIN D) 50 MCG (2000 UT) tablet Take 2,000 Units by mouth daily.    . ferrous sulfate 325 (65 FE) MG tablet Take 325 mg by mouth daily with breakfast.    . guaiFENesin (MUCINEX) 600 MG 12 hr tablet Take 600 mg by  mouth every 12 (twelve) hours as needed for cough (congestion).    . hydrocerin (EUCERIN) CREA Apply 1 application topically 2 (two) times daily. Apply to body  0  . insulin aspart (NOVOLOG) 100 UNIT/ML injection 0-15 Units, Subcutaneous, 3 times daily with meals CBG < 70: implement hypoglycemia protocol CBG 70 - 120: 0 units CBG 121 - 150: 2 units CBG 151 - 200: 3 units CBG 201 - 250: 5 units CBG 251 - 300: 8 units CBG 301 - 350: 11 units CBG 351 - 400: 15 units CBG > 400: call MD 10 mL 11  .  Insulin Glargine (BASAGLAR KWIKPEN) 100 UNIT/ML SOPN Inject 0.13 mLs (13 Units total) into the skin at bedtime.    Marland Kitchen lactulose (CHRONULAC) 10 GM/15ML solution Take 15 mLs (10 g total) by mouth 2 (two) times daily. 236 mL 0  . multivitamin (RENA-VIT) TABS tablet Take 1 tablet by mouth at bedtime.  0  . Nutritional Supplements (FEEDING SUPPLEMENT, NEPRO CARB STEADY,) LIQD Take 237 mLs by mouth 2 (two) times daily between meals.  0  . omeprazole (PRILOSEC OTC) 20 MG tablet Take 20 mg by mouth daily.     Marland Kitchen PHENobarbital (LUMINAL) 32.4 MG tablet Take 7 tablets (226.8 mg total) by mouth at bedtime. 90 tablet 0  . simvastatin (ZOCOR) 20 MG tablet Take 20 mg by mouth at bedtime.   3  . tamsulosin (FLOMAX) 0.4 MG CAPS capsule Take 0.4 mg by mouth at bedtime.     Labs: Basic Metabolic Panel: Recent Labs  Lab 06/18/18 1319  NA 135  K 3.5  CL 97*  CO2 20*  GLUCOSE 418*  BUN 52*  CREATININE 6.95*  CALCIUM 7.5*   Liver Function Tests: Recent Labs  Lab 06/18/18 1319  AST 14*  ALT 11  ALKPHOS 112  BILITOT 0.7  PROT 6.2*  ALBUMIN 2.1*   Recent Labs  Lab 06/18/18 1334  LIPASE 12   No results for input(s): AMMONIA in the last 168 hours. CBC: Recent Labs  Lab 06/18/18 1319  WBC 12.5*  NEUTROABS 10.8*  HGB 9.1*  HCT 28.2*  MCV 93.7  PLT 143*   Cardiac Enzymes: No results for input(s): CKTOTAL, CKMB, CKMBINDEX, TROPONINI in the last 168 hours. CBG: Recent Labs  Lab 06/18/18 1301   GLUCAP 373*   Iron Studies: No results for input(s): IRON, TIBC, TRANSFERRIN, FERRITIN in the last 72 hours. Studies/Results: Dg Chest Port 1 View  Result Date: 06/18/2018 CLINICAL DATA:  Fever, vomiting, shortness of breath. EXAM: PORTABLE CHEST 1 VIEW COMPARISON:  Chest x-ray from earlier same day. Chest x-ray dated 05/21/2018. FINDINGS: Heart size is upper normal. Lungs are clear. No pleural effusion or pneumothorax seen. Dialysis catheter is stable in position with tip at the level of the cavoatrial junction. Osseous structures about the chest are unremarkable. IMPRESSION: No active disease. No evidence of pneumonia or pulmonary edema. Electronically Signed   By: Franki Cabot M.D.   On: 06/18/2018 13:58    ROS: As per HPI otherwise negative.  Physical Exam: Vitals:   06/18/18 1253 06/18/18 1400 06/18/18 1430 06/18/18 1500  BP:  107/66 128/84 131/85  Pulse:  100 98 87  Resp:  (!) 29 (!) 29 17  Temp:      TempSrc:      SpO2:  92% 92% 92%  Weight: 115.1 kg     Height: 6' 2"  (1.88 m)        General: poorly kempt WM appears old than current age.smells or urine chronically ill Head: NCAT sclera not icteric MMM, dentition poor, no erythema or exudates Neck: Supple.  Lungs: CTA bilaterally without wheezes, rales, or rhonchi. Breathing is unlabored. Heart: RRR with S1 S2.  Abdomen: soft + BS Lsided/LLQ tenderness, mild guarding,  M-S:  Muscle atrophy Skin:  Thickened, lax chronic skin changes Lower extremities: without edema  Neuro: A & O  X 3. Moves all extremities spontaneously. Psych:  Responds to questions appropriately with a normal affect. Dialysis Access: right IJ TDC no drainage or erythema around exit site  Dialysis Orders: AKI / [place] MWF 3.5h  400/800  114.5kg leaving below  3K/2.5Ca bath  RIJ TDC   Hep 3000  Venofer through 6/5  Mircera 100 last 5/15  no VDRA  Labs: hgb 9 18% sat Ca/P slight ^ -recently started on Ca acetate 1 ac iPTH ok Glu high 300 - 500s Cr  5.37 5/13 Cr 4.6 5/18  Assessment/Plan: 1. Fever with N, V, since Friday - CXR neg mild ^ WBC, hx ESBL, UTI - U/A micro pending, has TDC,COVID neg, - work up  And antibiotics as per primary 2. AKI - with hypokalemia - no acute need today based on labs and CXR/PE - defer HD  until tomorrow; given acute status - continue to avoid NSAIDs, contrast where possible, ACE/ARBs and baclofen 3. Hypertension/volume  - weights have been variable lately- BP ok avoid low BP 4. Anemia  - hold Fe for now - continue ESA due for redose this week hgb stable 5. Metabolic bone disease -  No binders or VDRA needed 6. Nutrition - alb low - hx cirrhosis plu multiple hospitalizations - needs protein supplements when eating advance diet as tolerated 7. DM - A1c 8.3 in March - sugars seem to be running high in outpatient setting ? Poor control vs infection 8. Cirrhosis - on lactulose which causes chronic diarrhea. 9. H/o cognitive delay  10. H/o debility - SNF dependent x about 1 year  Myriam Jacobson, Hershal Coria Vickery [phone] 06/18/2018, 3:27 PM   Pt seen, examined and agree w A/P as above. Ordered I/O cath for urine cx given abd CT findings.  Kelly Splinter  MD 06/18/2018, 5:04 PM

## 2018-06-18 NOTE — ED Provider Notes (Signed)
[facility] EMERGENCY DEPARTMENT Provider Note   CSN: [id number] Arrival date & time: 06/18/18  1249    History   Chief Complaint Chief Complaint  Patient presents with  . Nausea  . Fever    HPI Eddie Davies is a 48 y.o. male.     The history is provided by the patient, medical records and the EMS personnel. No language interpreter was used.  Fever   Eddie Davies is a 48 y.o. male who presents to the Emergency Department complaining of fever. He presents to the emergency department by EMS for evaluation of fever that started today. He has a history of ESR D and dialyzed is Monday, Wednesday, Friday. He is a resident of a nursing facility. He has been sick since Friday with nausea, vomiting, diarrhea, abdominal pain on the left side. His last dialysis was on Friday. Today when he went to dialysis he was noted to be febrile and they referred him to the emergency department due to the fever. He does complain of mild shortness of breath. He feels poorly. No known sick contacts. Symptoms are severe, constant, worsening. Past Medical History:  Diagnosis Date  . Cirrhosis ([place]) 04/2018  . Diabetes mellitus without complication ([place])   . Liver failure ([place])   . Metabolic acidosis 50/0370  . Renal failure     Patient Active Problem List   Diagnosis Date Noted  . Acute metabolic encephalopathy 48/88/9169  . Seizures ([place]) 05/24/2018  . BPH (benign prostatic hyperplasia) 05/24/2018  . Sepsis ([place]) 05/24/2018  . Benign essential HTN 05/24/2018  . HLD (hyperlipidemia) 05/24/2018  . Diabetes mellitus type 2, uncontrolled, with complications ([place]) 45/03/8880  . Thrombocytopenia ([place]) 05/24/2018  . Confusion   . Metabolic acidosis 80/03/4915  . Respiratory failure ([place])   . Acute renal failure superimposed on stage 4 chronic kidney disease ([place])   . End stage liver disease ([place])   . Pressure injury of skin 04/21/2018  . Altered mental status, unspecified  04/21/2018  . HCAP (healthcare-associated pneumonia) 04/21/2018  . Acute renal failure (ARF) ([place]) 04/07/2018    Past Surgical History:  Procedure Laterality Date  . CHOLECYSTECTOMY    . IR FLUORO GUIDE CV LINE RIGHT  05/30/2018  . IR US GUIDE VASC ACCESS RIGHT  05/30/2018        Home Medications    Prior to Admission medications   Medication Sig Start Date End Date Taking? Authorizing Provider  acetaminophen (TYLENOL) 325 MG tablet Take 650 mg by mouth every 8 (eight) hours as needed for fever (pain).    [provider]  carbamazepine (TEGRETOL) 100 MG chewable tablet Chew 100 mg by mouth 2 (two) times daily. Take with 600 mg for a total dose of 700 mg.    [provider]  carbamazepine (TEGRETOL) 200 MG tablet Take 600 mg by mouth 2 (two) times daily.    [provider]  Cholecalciferol (VITAMIN D) 50 MCG (2000 UT) tablet Take 2,000 Units by mouth daily.    [provider]  ferrous sulfate 325 (65 FE) MG tablet Take 325 mg by mouth daily with breakfast.    [provider]  guaiFENesin (MUCINEX) 600 MG 12 hr tablet Take 600 mg by mouth every 12 (twelve) hours as needed for cough (congestion).    [provider]  hydrocerin (EUCERIN) CREA Apply 1 application topically 2 (two) times daily. Apply to body 04/30/18   [name], [name], MD  insulin aspart (NOVOLOG) 100 UNIT/ML  injection 0-15 Units, Subcutaneous, 3 times daily with meals CBG < 70: implement hypoglycemia protocol CBG 70 - 120: 0 units CBG 121 - 150: 2 units CBG 151 - 200: 3 units CBG 201 - 250: 5 units CBG 251 - 300: 8 units CBG 301 - 350: 11 units CBG 351 - 400: 15 units CBG > 400: call MD 04/30/18   [name], MD  Insulin Glargine (BASAGLAR KWIKPEN) 100 UNIT/ML SOPN Inject 0.13 mLs (13 Units total) into the skin at bedtime. 05/31/18   [name], [name] K, MD  lactulose (CHRONULAC) 10 GM/15ML solution Take 15 mLs (10 g total) by mouth 2 (two) times daily. 05/31/18   [name],  Vernelle Emerald, MD  multivitamin (RENA-VIT) TABS tablet Take 1 tablet by mouth at bedtime. 04/30/18   [name], [name], MD  Nutritional Supplements (FEEDING SUPPLEMENT, NEPRO CARB STEADY,) LIQD Take 237 mLs by mouth 2 (two) times daily between meals. 04/30/18   [name], [name], MD  omeprazole (PRILOSEC OTC) 20 MG tablet Take 20 mg by mouth daily.     [provider]  PHENobarbital (LUMINAL) 32.4 MG tablet Take 7 tablets (226.8 mg total) by mouth at bedtime. 05/31/18   [name], Vernelle Emerald, MD  simvastatin (ZOCOR) 20 MG tablet Take 20 mg by mouth at bedtime.     [provider]  tamsulosin (FLOMAX) 0.4 MG CAPS capsule Take 0.4 mg by mouth at bedtime.    [provider]    Family History No family history on file.  Social History Social History   Tobacco Use  . Smoking status: Current Some Day Smoker    Types: Cigarettes  . Smokeless tobacco: Never Used  Substance Use Topics  . Alcohol use: Not Currently  . Drug use: Never     Allergies   Fish oil   Review of Systems Review of Systems  Constitutional: Positive for fever.  All other systems reviewed and are negative.    Physical Exam Updated Vital Signs BP 129/77   Pulse (!) 103   Temp (S) (!) 101.8 F (38.8 C) (Oral)   Resp 15   Ht _0  (1.88 m)   Wt 115.1 kg   SpO2 96%   BMI 32.58 kg/m   Physical Exam Vitals signs and nursing note reviewed.  Constitutional:      Appearance: He is well-developed.  HENT:     Head: Normocephalic and atraumatic.  Cardiovascular:     Rate and Rhythm: Normal rate and regular rhythm.     Heart sounds: No murmur.  Pulmonary:     Effort: Pulmonary effort is normal. No respiratory distress.     Comments: Right upper chest wall with vascular catheter in place. Dressing clean, dry, intact. Tachypnea.   Abdominal:     Palpations: Abdomen is soft.     Tenderness: There is no guarding or rebound.     Comments: Moderate left sided abdominal tenderness   Musculoskeletal:        General: No tenderness.  Skin:    General: Skin is warm and dry.  Neurological:     Mental Status: He is alert and oriented to person, place, and time.  Psychiatric:        Behavior: Behavior normal.      ED Treatments / Results  Labs (all labs ordered are listed, but only abnormal results are displayed) Labs Reviewed  CBG MONITORING, ED - Abnormal; Notable for the following components:      Result Value   Glucose-Capillary  373 (*)    All other components within normal limits  CULTURE, BLOOD (ROUTINE X 2)  CULTURE, BLOOD (ROUTINE X 2)  COMPREHENSIVE METABOLIC PANEL  LACTIC ACID, PLASMA  LACTIC ACID, PLASMA  CBC WITH DIFFERENTIAL/PLATELET  PROTIME-INR  LIPASE, BLOOD    EKG EKG Interpretation  Date/Time:  Monday Jun 18 2018 12:50:42 EDT Ventricular Rate:  103 PR Interval:    QRS Duration: 120 QT Interval:  359 QTC Calculation: 470 R Axis:   -158 Text Interpretation:  Sinus tachycardia Nonspecific intraventricular conduction delay No significant change since last tracing Confirmed by Quintella Reichert [phone]) on 06/18/2018 12:56:50 PM   Radiology No results found.  Procedures Procedures (including critical care time) CRITICAL CARE Performed by: Quintella Reichert   Total critical care time: 35 minutes  Critical care time was exclusive of separately billable procedures and treating other patients.  Critical care was necessary to treat or prevent imminent or life-threatening deterioration.  Critical care was time spent personally by me on the following activities: development of treatment plan with patient and/or surrogate as well as nursing, discussions with consultants, evaluation of patient's response to treatment, examination of patient, obtaining history from patient or surrogate, ordering and performing treatments and interventions, ordering and review of laboratory studies, ordering and review of radiographic studies, pulse oximetry and  re-evaluation of patient's condition.  Medications Ordered in ED Medications - No data to display   Initial Impression / Assessment and Plan / ED Course  I have reviewed the triage vital signs and the nursing notes.  Pertinent labs & imaging results that were available during my care of the patient were reviewed by me and considered in my medical decision making (see chart for details).        Patient here for evaluation of abdominal pain, vomiting and diarrhea for three days. He is ill appearing on evaluation with significant abdominal tenderness. He was treated with broad-spectrum antibiotics for possible sepsis on ED evaluation. He did have a ED visit earlier today at [facility] for hyperglycemia and was discharged at that time. He was found to have leukocytosis to 15,000 earlier today. Discussed with nephrology. Medicine consulted for admission. Patient updated of findings of studies and recommendation for admission and he is in agreement with treatment plan.    Final Clinical Impressions(s) / ED Diagnoses   Final diagnoses:  None    ED Discharge Orders    None       Quintella Reichert, MD 06/18/18 [phone]

## 2018-06-18 NOTE — [place] Notes (Addendum)
Pharmacy Antibiotic Note  ABB GOBERT is a 48 y.o. male admitted on 06/18/2018 with N/V, fever and left sided abdominal tenderness. Pharmacy has been consulted for meropenem dosing. Patient has PMH significant for recent history of ESBL (K. Pneumoniae from expectorated sputum on 04/22/18) and ESRD on HD MWF. Tmax 101.8, RR 29, HR 103, SCr 6.95, and WBC of 12.5.  Plan: Start meropenem 500 mg IV q24h F/u HD schedule Monitor CBC and clinical progression F/u plans for de-escalation of abx and LOT  Height: 6' 2"  (188 cm) Weight: 253 lb 12 oz (115.1 kg) IBW/kg (Calculated) : 82.2  Temp (24hrs), Avg:101.8 F (38.8 C), Min:101.8 F (38.8 C), Max:101.8 F (38.8 C)  Recent Labs  Lab 06/18/18 1319  WBC PENDING    Estimated Creatinine Clearance: 42.1 mL/min (A) (by C-G formula based on SCr of 2.93 mg/dL (H)).    Allergies  Allergen Reactions  . Fish Oil Swelling and Other (See Comments)        Antimicrobials this admission: Meropenem 5/25 >>   Dose adjustments this admission: N/A  Microbiology results: 5/25 BCx:  5/25 Covid:   Thank you for allowing pharmacy to be a part of this patient's care.  Leron Croak, PharmD PGY1 Pharmacy Resident Phone: [phone]  Please check AMION for all [place] phone numbers 06/18/2018 1:48 PM

## 2018-06-18 NOTE — [place] Notes (Signed)
NEW ADMISSION NOTE New Admission Note:   Arrival Method: stretcher from ed  Mental Orientation:  Telemetry:Box: 05, ST, NSR Assessment: Completed Skin: INTACT: DRY, AND FLAKY  IV: LAC : infusing  Pain: 0  Tubes: NA Safety Measures: Safety Fall Prevention Plan has been given, discussed and signed Admission: Completed [facility] Orientation: Patient has been orientated to the room, unit and staff.  Family: NA  Orders have been reviewed and implemented. Will continue to monitor the patient. Call light has been placed within reach and bed alarm has been activated.   Baldo Ash, RN

## 2018-06-19 DIAGNOSIS — Z993 Dependence on wheelchair: Secondary | ICD-10-CM | POA: Diagnosis not present

## 2018-06-19 DIAGNOSIS — N186 End stage renal disease: Secondary | ICD-10-CM | POA: Diagnosis present

## 2018-06-19 DIAGNOSIS — E1122 Type 2 diabetes mellitus with diabetic chronic kidney disease: Secondary | ICD-10-CM | POA: Diagnosis present

## 2018-06-19 DIAGNOSIS — Z87440 Personal history of urinary (tract) infections: Secondary | ICD-10-CM | POA: Diagnosis not present

## 2018-06-19 DIAGNOSIS — E118 Type 2 diabetes mellitus with unspecified complications: Secondary | ICD-10-CM | POA: Diagnosis not present

## 2018-06-19 DIAGNOSIS — Z20828 Contact with and (suspected) exposure to other viral communicable diseases: Secondary | ICD-10-CM | POA: Diagnosis present

## 2018-06-19 DIAGNOSIS — I120 Hypertensive chronic kidney disease with stage 5 chronic kidney disease or end stage renal disease: Secondary | ICD-10-CM | POA: Diagnosis present

## 2018-06-19 DIAGNOSIS — R7881 Bacteremia: Secondary | ICD-10-CM

## 2018-06-19 DIAGNOSIS — R569 Unspecified convulsions: Secondary | ICD-10-CM | POA: Diagnosis present

## 2018-06-19 DIAGNOSIS — K861 Other chronic pancreatitis: Secondary | ICD-10-CM | POA: Diagnosis present

## 2018-06-19 DIAGNOSIS — Z992 Dependence on renal dialysis: Secondary | ICD-10-CM | POA: Diagnosis not present

## 2018-06-19 DIAGNOSIS — Z87442 Personal history of urinary calculi: Secondary | ICD-10-CM | POA: Diagnosis not present

## 2018-06-19 DIAGNOSIS — E663 Overweight: Secondary | ICD-10-CM | POA: Diagnosis present

## 2018-06-19 DIAGNOSIS — D509 Iron deficiency anemia, unspecified: Secondary | ICD-10-CM | POA: Diagnosis present

## 2018-06-19 DIAGNOSIS — B961 Klebsiella pneumoniae [K. pneumoniae] as the cause of diseases classified elsewhere: Secondary | ICD-10-CM

## 2018-06-19 DIAGNOSIS — E876 Hypokalemia: Secondary | ICD-10-CM | POA: Diagnosis present

## 2018-06-19 DIAGNOSIS — D696 Thrombocytopenia, unspecified: Secondary | ICD-10-CM | POA: Diagnosis present

## 2018-06-19 DIAGNOSIS — N170 Acute kidney failure with tubular necrosis: Secondary | ICD-10-CM | POA: Diagnosis present

## 2018-06-19 DIAGNOSIS — Z6830 Body mass index (BMI) 30.0-30.9, adult: Secondary | ICD-10-CM | POA: Diagnosis not present

## 2018-06-19 DIAGNOSIS — K529 Noninfective gastroenteritis and colitis, unspecified: Secondary | ICD-10-CM | POA: Diagnosis present

## 2018-06-19 DIAGNOSIS — N2581 Secondary hyperparathyroidism of renal origin: Secondary | ICD-10-CM | POA: Diagnosis present

## 2018-06-19 DIAGNOSIS — F17210 Nicotine dependence, cigarettes, uncomplicated: Secondary | ICD-10-CM | POA: Diagnosis present

## 2018-06-19 DIAGNOSIS — E1165 Type 2 diabetes mellitus with hyperglycemia: Secondary | ICD-10-CM | POA: Diagnosis present

## 2018-06-19 DIAGNOSIS — D631 Anemia in chronic kidney disease: Secondary | ICD-10-CM | POA: Diagnosis present

## 2018-06-19 DIAGNOSIS — A4159 Other Gram-negative sepsis: Secondary | ICD-10-CM | POA: Diagnosis present

## 2018-06-19 DIAGNOSIS — K7460 Unspecified cirrhosis of liver: Secondary | ICD-10-CM | POA: Diagnosis present

## 2018-06-19 DIAGNOSIS — N10 Acute tubulo-interstitial nephritis: Secondary | ICD-10-CM | POA: Diagnosis present

## 2018-06-19 HISTORY — DX: Bacteremia: R78.81

## 2018-06-19 HISTORY — DX: Klebsiella pneumoniae (k. pneumoniae) as the cause of diseases classified elsewhere: B96.1

## 2018-06-19 LAB — BLOOD CULTURE ID PANEL (REFLEXED)

## 2018-06-19 LAB — COMPREHENSIVE METABOLIC PANEL
ALT: 10 U/L (ref 0–44)
AST: 11 U/L — ABNORMAL LOW (ref 15–41)
Albumin: 1.9 g/dL — ABNORMAL LOW (ref 3.5–5.0)
Alkaline Phosphatase: 100 U/L (ref 38–126)
Anion gap: 14 (ref 5–15)
BUN: 64 mg/dL — ABNORMAL HIGH (ref 6–20)
CO2: 21 mmol/L — ABNORMAL LOW (ref 22–32)
Calcium: 7.3 mg/dL — ABNORMAL LOW (ref 8.9–10.3)
Chloride: 98 mmol/L (ref 98–111)
Creatinine, Ser: 7.69 mg/dL — ABNORMAL HIGH (ref 0.61–1.24)
GFR calc Af Amer: 9 mL/min — ABNORMAL LOW (ref >60)
GFR calc non Af Amer: 8 mL/min — ABNORMAL LOW (ref >60)
Glucose, Bld: 375 mg/dL — ABNORMAL HIGH (ref 70–99)
Potassium: 3.4 mmol/L — ABNORMAL LOW (ref 3.5–5.1)
Sodium: 133 mmol/L — ABNORMAL LOW (ref 135–145)
Total Bilirubin: 0.8 mg/dL (ref 0.3–1.2)
Total Protein: 5.5 g/dL — ABNORMAL LOW (ref 6.5–8.1)

## 2018-06-19 LAB — GLUCOSE, CAPILLARY
Glucose-Capillary: 337 mg/dL — ABNORMAL HIGH (ref 70–99)
Glucose-Capillary: 383 mg/dL — ABNORMAL HIGH (ref 70–99)
Glucose-Capillary: 142 mg/dL — ABNORMAL HIGH (ref 70–99)
Glucose-Capillary: 260 mg/dL — ABNORMAL HIGH (ref 70–99)

## 2018-06-19 LAB — CBC
HCT: 25 % — ABNORMAL LOW (ref 39.0–52.0)
Hemoglobin: 8.1 g/dL — ABNORMAL LOW (ref 13.0–17.0)
MCH: 29.8 pg (ref 26.0–34.0)
MCHC: 32.4 g/dL (ref 30.0–36.0)
MCV: 91.9 fL (ref 80.0–100.0)
Platelets: 121 10*3/uL — ABNORMAL LOW (ref 150–400)
RBC: 2.72 MIL/uL — ABNORMAL LOW (ref 4.22–5.81)
RDW: 15.9 % — ABNORMAL HIGH (ref 11.5–15.5)
WBC: 8.5 10*3/uL (ref 4.0–10.5)
nRBC: 0 % (ref 0.0–0.2)

## 2018-06-19 LAB — HEMOGLOBIN A1C
Hgb A1c MFr Bld: 7.2 % — ABNORMAL HIGH (ref 4.8–5.6)
Mean Plasma Glucose: 159.94 mg/dL

## 2018-06-19 MED ORDER — INSULIN GLARGINE 100 UNIT/ML ~~LOC~~ SOLN
12.0000 [IU] | Freq: Two times a day (BID) | SUBCUTANEOUS | Status: DC
  Administered 2018-06-19 – 2018-06-23 (×8): 12 [IU] via SUBCUTANEOUS
  Filled 2018-06-19 (×10): qty 0.12

## 2018-06-19 MED ORDER — SODIUM CHLORIDE 0.9 % IV SOLN: 100.0000 mL | INTRAVENOUS | Status: DC | PRN

## 2018-06-19 MED ORDER — LIDOCAINE HCL (PF) 1 % IJ SOLN: 5.0000 mL | INTRAMUSCULAR | Status: DC | PRN

## 2018-06-19 MED ORDER — PENTAFLUOROPROP-TETRAFLUOROETH EX AERO: 1.0000 "application " | INHALATION_SPRAY | CUTANEOUS | Status: DC | PRN

## 2018-06-19 MED ORDER — HEPARIN SODIUM (PORCINE) 1000 UNIT/ML DIALYSIS
1000.0000 [IU] | INTRAMUSCULAR | Status: DC | PRN
  Administered 2018-06-19: 1000 [IU] via INTRAVENOUS_CENTRAL
  Filled 2018-06-19: qty 1

## 2018-06-19 MED ORDER — ALTEPLASE 2 MG IJ SOLR: 2.0000 mg | Freq: Once | INTRAMUSCULAR | Status: DC | PRN

## 2018-06-19 MED ORDER — INSULIN ASPART 100 UNIT/ML ~~LOC~~ SOLN
6.0000 [IU] | Freq: Three times a day (TID) | SUBCUTANEOUS | Status: DC
  Administered 2018-06-19 – 2018-06-25 (×17): 6 [IU] via SUBCUTANEOUS

## 2018-06-19 MED ORDER — HEPARIN SODIUM (PORCINE) 1000 UNIT/ML DIALYSIS: 20.0000 [IU]/kg | INTRAMUSCULAR | Status: DC | PRN

## 2018-06-19 MED ORDER — LIDOCAINE-PRILOCAINE 2.5-2.5 % EX CREA: 1.0000 "application " | TOPICAL_CREAM | CUTANEOUS | Status: DC | PRN

## 2018-06-19 MED ORDER — HEPARIN SODIUM (PORCINE) 1000 UNIT/ML IJ SOLN
INTRAMUSCULAR | Status: AC
  Filled 2018-06-19: qty 4

## 2018-06-19 NOTE — [place] Notes (Signed)
PHARMACY - PHYSICIAN COMMUNICATION CRITICAL VALUE ALERT - BLOOD CULTURE IDENTIFICATION (BCID)  Eddie Davies is an 48 y.o. male who presented to [facility] on 06/18/2018 with a chief complaint of fever and abdominal pain.   Assessment: 48 year old male with hx of ESRD, bilateral nephrolithiasis, chronic pancreatitis, cirrhosis of liver who presented with 4 days of abdominal pain and nausea vomiting and diarrhea. Now with 2/4 bottles (2/2 sets) positive for Klebsiella pneumoniae. Noted recent history of ESBL Klebsiella in March of 2020 in respiratory culture.   Name of physician (or Provider) Contacted: Eliseo Squires  Current antibiotics: Vanc/Merrem  Changes to prescribed antibiotics recommended:  Stop Vanc. Keep Merrem due to recent history of ESBL Klebsiella   Results for orders placed or performed during the hospital encounter of 06/18/18  Blood Culture ID Panel (Reflexed) (Collected: 06/18/2018  1:35 PM)  Result Value Ref Range   Enterococcus species NOT DETECTED NOT DETECTED   Listeria monocytogenes NOT DETECTED NOT DETECTED   Staphylococcus species NOT DETECTED NOT DETECTED   Staphylococcus aureus (BCID) NOT DETECTED NOT DETECTED   Streptococcus species NOT DETECTED NOT DETECTED   Streptococcus agalactiae NOT DETECTED NOT DETECTED   Streptococcus pneumoniae NOT DETECTED NOT DETECTED   Streptococcus pyogenes NOT DETECTED NOT DETECTED   Acinetobacter baumannii NOT DETECTED NOT DETECTED   Enterobacteriaceae species DETECTED (A) NOT DETECTED   Enterobacter cloacae complex NOT DETECTED NOT DETECTED   Escherichia coli NOT DETECTED NOT DETECTED   Klebsiella oxytoca NOT DETECTED NOT DETECTED   Klebsiella pneumoniae DETECTED (A) NOT DETECTED   Proteus species NOT DETECTED NOT DETECTED   Serratia marcescens NOT DETECTED NOT DETECTED   Carbapenem resistance NOT DETECTED NOT DETECTED   Haemophilus influenzae NOT DETECTED NOT DETECTED   Neisseria meningitidis NOT DETECTED NOT DETECTED   Pseudomonas aeruginosa NOT DETECTED NOT DETECTED   Candida albicans NOT DETECTED NOT DETECTED   Candida glabrata NOT DETECTED NOT DETECTED   Candida krusei NOT DETECTED NOT DETECTED   Candida parapsilosis NOT DETECTED NOT DETECTED   Candida tropicalis NOT DETECTED NOT DETECTED    Jimmy Footman, PharmD, BCPS, BCIDP Infectious Diseases Clinical Pharmacist Phone: [phone] 06/19/2018  9:14 AM

## 2018-06-19 NOTE — Progress Notes (Addendum)
Eddie Davies Progress Note   Subjective: Seen in room. No CP/dyspnea today. Afebrile today. BCx + klebsiella.  Objective Vitals:   06/18/18 1630 06/18/18 1744 06/18/18 2038 06/19/18 0127  BP: 94/79 (!) 164/89 138/74   Pulse: 89 (!) 107 94   Resp: 12 18 19    Temp:  99.9 F (37.7 C) 98.9 F (37.2 C)   TempSrc:  Oral Oral   SpO2: 98% 99% 98%   Weight:   116.4 kg 116.4 kg  Height:       Physical Exam General: Overweight man, NAD Heart: RRR; no murmur Lungs: CTA anteriorly Abdomen: soft, non-tender Extremities: No LE edema; scaly feet/legs - likely icthyosis Dialysis Access:   Additional Objective Labs: Basic Metabolic Panel: Recent Labs  Lab 06/18/18 1319 06/19/18 0504  NA 135 133*  K 3.5 3.4*  CL 97* 98  CO2 20* 21*  GLUCOSE 418* 375*  BUN 52* 64*  CREATININE 6.95* 7.69*  CALCIUM 7.5* 7.3*   Liver Function Tests: Recent Labs  Lab 06/18/18 1319 06/19/18 0504  AST 14* 11*  ALT 11 10  ALKPHOS 112 100  BILITOT 0.7 0.8  PROT 6.2* 5.5*  ALBUMIN 2.1* 1.9*   Recent Labs  Lab 06/18/18 1334  LIPASE 12   CBC: Recent Labs  Lab 06/18/18 1319 06/19/18 0504  WBC 12.5* 8.5  NEUTROABS 10.8*  --   HGB 9.1* 8.1*  HCT 28.2* 25.0*  MCV 93.7 91.9  PLT 143* 121*   Blood Culture    Component Value Date/Time   SDES BLOOD LEFT HAND 06/18/2018 1335   SPECREQUEST  06/18/2018 1335    BOTTLES DRAWN AEROBIC AND ANAEROBIC Blood Culture adequate volume   CULT GRAM NEGATIVE RODS 06/18/2018 1335   REPTSTATUS PENDING 06/18/2018 1335   Studies/Results: Ct Abdomen Pelvis Wo Contrast  Result Date: 06/18/2018 CLINICAL DATA:  Abdominal pain, fever, abscess suspected EXAM: CT ABDOMEN AND PELVIS WITHOUT CONTRAST TECHNIQUE: Multidetector CT imaging of the abdomen and pelvis was performed following the standard protocol without IV contrast. COMPARISON:  05/19/2018 FINDINGS: Lower chest: No acute abnormality.  Artery calcifications. Hepatobiliary: Coarse contour of  the liver. Status post cholecystectomy. No biliary dilatation. Pancreas: Pancreatic parenchymal calcifications and atrophy. No pancreatic ductal dilatation or surrounding inflammatory changes. Spleen: Splenomegaly, maximum span approximately 18.7 cm. Adrenals/Urinary Tract: Adrenal glands are unremarkable. There is extensive perirenal and adjacent retroperitoneal fat stranding, similar in appearance to prior examination. Unchanged 10 mm calculus in the right renal pelvis and punctuate nonobstructive calculi in the left kidney. No hydronephrosis. Mild thickening and fat stranding about the urinary bladder. Stomach/Bowel: Stomach is within normal limits. Appendix is not clearly visualized and may be surgically absent. No evidence of bowel wall thickening, distention, or inflammatory changes. Vascular/Lymphatic: No significant vascular findings are present. No enlarged abdominal or pelvic lymph nodes. Reproductive: No mass or other abnormality. Other: No abdominal wall hernia or abnormality. Evidence of prior umbilical hernia repair. No abdominopelvic ascites. Musculoskeletal: No acute or significant osseous findings. IMPRESSION: 1. There is extensive perirenal and adjacent retroperitoneal fat stranding, similar in appearance to prior examination. Unchanged 10 mm calculus in the right renal pelvis and punctuate nonobstructive calculi in the left kidney. No hydronephrosis. Mild thickening and fat stranding about the urinary bladder. This constellation of findings is similar to prior examination and may reflect recurrent or ongoing infectious or inflammatory pyelonephritis/cystitis. Correlate with urinalysis. 2.  Stigmata of cirrhosis and splenomegaly. 3.  Stigmata of chronic pancreatitis. Electronically Signed   By: Dorna Bloom.D.  On: 06/18/2018 15:54   Dg Chest Port 1 View  Result Date: 06/18/2018 CLINICAL DATA:  Fever, vomiting, shortness of breath. EXAM: PORTABLE CHEST 1 VIEW COMPARISON:  Chest x-ray from  earlier same day. Chest x-ray dated 05/21/2018. FINDINGS: Heart size is upper normal. Lungs are clear. No pleural effusion or pneumothorax seen. Dialysis catheter is stable in position with tip at the level of the cavoatrial junction. Osseous structures about the chest are unremarkable. IMPRESSION: No active disease. No evidence of pneumonia or pulmonary edema. Electronically Signed   By: Franki Cabot M.D.   On: 06/18/2018 13:58   Medications: . meropenem (MERREM) IV Stopped (06/18/18 1502)   . calcium acetate  667 mg Oral TID WC  . carbamazepine  700 mg Oral BID  . Chlorhexidine Gluconate Cloth  6 each Topical Q0600  . cholecalciferol  2,000 Units Oral Daily  . [START ON 06/22/2018] darbepoetin (ARANESP) injection - DIALYSIS  100 mcg Intravenous Q Fri-HD  . feeding supplement (NEPRO CARB STEADY)  237 mL Oral BID BM  . feeding supplement (PRO-STAT SUGAR FREE 64)  30 mL Oral BID  . ferrous sulfate  325 mg Oral Q breakfast  . heparin  5,000 Units Subcutaneous Q8H  . insulin aspart  0-5 Units Subcutaneous QHS  . insulin aspart  0-9 Units Subcutaneous TID WC  . insulin aspart  3 Units Subcutaneous TID WC  . insulin glargine  15 Units Subcutaneous QHS  . lactulose  10 g Oral Q T,Th,S,Su  . lactulose  20 g Oral Q M,W,F  . lipase/protease/amylase  12,000 Units Oral TID WC  . multivitamin  1 tablet Oral QHS  . PHENobarbital  226.8 mg Oral QHS  . simvastatin  20 mg Oral QHS  . tamsulosin  0.4 mg Oral QHS    Dialysis Orders: AKI / [name] MWF 3.5h   400/800  114.5kg leaving below  3K/2.5Ca bath  RIJ TDC   Hep 3000  Venofer through 6/5  Mircera 100 last 5/15  no VDRA  Labs: hgb 9 18% sat Ca/P slight ^ -recently started on Ca acetate 1 ac iPTH ok Glu high 300 - 500s Cr 5.37 5/13 Cr 4.6 5/18  Assessment/Plan: 1. Fever/Klebsiella bacteremia: +/- UTI, Cx pending. BCx 5/25 growing Klebsiella. COVID negative. On meropenem. 2. AKI: On HD since 4/30 on MWF schedule. BUN/Cr rising - for HD  today, may need to dialyze off usual schedule this week. Given acute status - continue to avoid NSAIDs, contrast where possible, ACE/ARBs and baclofen 3. Hypertension/volume: BP good, weights variable - trying to avoid hypotension. 4. Anemia: Hgb 8.1, holding ESA d/t bacteremia. ESA due later this week.  5. Metabolic bone disease: Corr Ca ok, Phos pending. No binders or VDRA needed 6. Nutrition: Alb very low. Hx cirrhosis plus multiple hospitalizations. Continue Nepro/Pro-stat. 7. DM: Last A1c 8.3 in March - sugars seem to be running high in outpatient setting. ?Poor control vs infection 8. Cirrhosis: On lactulose. 9. H/o cognitive delay  10. H/o debility: SNF dependent x about 1 year  Eddie Davies, Eddie Davies 06/19/2018, 9:32 AM  [facility] Pager: [phone]  Pt seen, examined and agree w A/P as above.  Eddie Splinter  MD 06/19/2018, 3:35 PM

## 2018-06-19 NOTE — Evaluation (Addendum)
Physical Therapy Evaluation Patient Details Name: Eddie Davies MRN: [id number] DOB: [birth year] Today's Date: 06/19/2018   History of Present Illness  Pt is a 48 y.o. male resident at [facility], admitted on 06/18/18 with fever and abdominal pain. CT abdomen concerning for recurrent or ongoing infectious pyelonephritis/cytstisis. Of note, recent admission 4/25-05/31/18 wtih ARF subsequently started on HD. PMH includes ESRD (on HD since 05/24/18), chronic pancreatitis, DM2, liver cirrhosis, anemia, tobacco abuse.    Clinical Impression  Pt presents with an overall decrease in functional mobility secondary to above. PTA, pt long-term resident at [facility]; reports he has been working with PT since previous admission. Today, pt requires minA for bed mobility; declining sitting EOB or OOB mobility. Frustrated with timing of PT before HD later today. Pt would benefit from continued acute PT services to maximize functional mobility and independence prior to d/c with continued SNF-level thearpies.     Follow Up Recommendations SNF;Supervision/Assistance - 24 hour    Equipment Recommendations  None recommended by PT    Recommendations for Other Services       Precautions / Restrictions Precautions Precautions: Fall Restrictions Weight Bearing Restrictions: No      Mobility  Bed Mobility Overal bed mobility: Needs Assistance Bed Mobility: Rolling           General bed mobility comments: MinA for rolling and repositioning in bed; pt able to slide himself up with BUE support pulling on rails and cues for sequencing. Declined sitting or OOB mobility  Transfers                    Ambulation/Gait                Stairs            Wheelchair Mobility    Modified Rankin (Stroke Patients Only)       Balance                                             Pertinent Vitals/Pain Pain Assessment: No/denies pain    Home Living  Family/patient expects to be discharged to:: Skilled nursing facility                 Additional Comments: Long term resident at [facility]    Prior Function Level of Independence: Needs assistance   Gait / Transfers Assistance Needed: Reports he works with PT 3x/wk, has been walking with RW and assist. Per chart, before recent admission 04/2018, was transferred to w/c with lift. Unsure if reliable historian           Hand Dominance        Extremity/Trunk Assessment   Upper Extremity Assessment Upper Extremity Assessment: Generalized weakness    Lower Extremity Assessment Lower Extremity Assessment: Generalized weakness       Communication   Communication: No difficulties  Cognition Arousal/Alertness: Awake/alert Behavior During Therapy: WFL for tasks assessed/performed Overall Cognitive Status: No family/caregiver present to determine baseline cognitive functioning Area of Impairment: Memory;Following commands;Problem solving;Safety/judgement;Attention;Awareness                   Current Attention Level: Sustained Memory: Decreased short-term memory Following Commands: Follows one step commands with increased time Safety/Judgement: Decreased awareness of deficits;Decreased awareness of safety Awareness: Emergent Problem Solving: Slow processing;Difficulty sequencing;Decreased initiation;Requires verbal cues;Requires tactile cues General Comments: Baseline  cognitive deficits. Pt upset with PT coming before HD, "Just when one of you leave, the next person comes, I cannot catch a break." Discussed preference for time of day for PT treatment but pt never answering this question. Not forthcoming with details of PLOF. Following commands consistently when he wanted to      General Comments      Exercises     Assessment/Plan    PT Assessment Patient needs continued PT services  PT Problem List Decreased strength;Decreased activity tolerance;Decreased  balance;Decreased mobility;Decreased knowledge of use of DME;Decreased cognition;Decreased safety awareness       PT Treatment Interventions DME instruction;Gait training;Functional mobility training;Therapeutic activities;Therapeutic exercise;Balance training;Patient/family education    PT Goals (Current goals can be found in the Care Plan section)  Acute Rehab PT Goals Patient Stated Goal: Have a cigarette PT Goal Formulation: With patient Time For Goal Achievement: 07/03/18 Potential to Achieve Goals: Fair    Frequency Min 2X/week   Barriers to discharge        Co-evaluation               AM-PAC PT "6 Clicks" Mobility  Outcome Measure Help needed turning from your back to your side while in a flat bed without using bedrails?: A Little Help needed moving from lying on your back to sitting on the side of a flat bed without using bedrails?: A Little Help needed moving to and from a bed to a chair (including a wheelchair)?: A Lot Help needed standing up from a chair using your arms (e.g., wheelchair or bedside chair)?: A Lot Help needed to walk in hospital room?: A Lot Help needed climbing 3-5 steps with a railing? : Total 6 Click Score: 13    End of Session   Activity Tolerance: Other (comment)(not wanting to get OOB) Patient left: in bed;with call bell/phone within reach;with bed alarm set Nurse Communication: Mobility status PT Visit Diagnosis: Other abnormalities of gait and mobility (R26.89);Muscle weakness (generalized) (M62.81)    Time: 2482-5003 PT Time Calculation (min) (ACUTE ONLY): 14 min   Charges:   PT Evaluation $PT Eval Moderate Complexity: [place], PT, DPT Acute Rehabilitation Services  Pager [phone] Office [place] 06/19/2018, 1:02 PM

## 2018-06-19 NOTE — Progress Notes (Signed)
Renal Navigator notified OP HD clinic/[place] of patient's admission and negative COVID 19 rapid test result in order to provide continuity of care and safety.  Alphonzo Cruise, [place]  Renal Navigator [phone]

## 2018-06-19 NOTE — Progress Notes (Signed)
[facility] PROGRESS NOTE  Eddie Davies KMM:[id number] DOB: August 03, 1970 DOA: 06/18/2018 PCP: [facility], [facility]  Assessment/Plan:  Bacteremia due to Klebsiella pneumoniae. ?acute pyelonephritis/cystitis. CT abdomen concerning for recurrent or ongoing infectious pyelonephritis/cytstisis.  Continues with  left-sided flank pain. 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 Afebrile and non-toxic appearing this am.  Urinalysis is pending. covid 19 test negative - Will follow cultures.  -continue  Meropenem -stop vanc -Repeat a.m. labs  Abdominal pain in a patient with chronic pancreatitis. Likely related to above. Lipase normal. -Pain management.  -continue pancreatic supplement.  End-stage renal disease on hemodialysis. MWF schedule. Missed yesterday due to fever. Evaluated by nephrology who recommended dialysis today and maybe tomorrow. Currently has right IJ tunnel catheter for hemodialysis with no evidence of erythema or drainage around the site. -dialysis today -defer management to nephrology  Diabetes mellitus type 2 uncontrolled with hyperglycemia -Follow hemoglobin A1c.  A1c was 8.3 03/2018 -continue lantus and meal coverage -SSI for optimal control  Anemia of chronic kidney disease along with iron deficiency anemia. Hemoglobin stable. -Continue iron supplementation.  Monitor  Chronic thrombocytopenia. Likely secondary to cirrhosis of liver. Appears stable. No signs of bleeding. -monitor  Cirrhosis of liver. No signs of ascites on CT of the abdomen. Home meds include lactulose which causes chronic diarrhea. - Outpatient follow-up with GI. -continue lactulose  History of seizures -No recent history of seizures.  Continue current home regimen.  Generalized deconditioning -PT eval.     Code Status: full Family Communication: none present Disposition Plan: back to  facility   Consultants:  Veneta Penton PA nephrology  Procedures:  Dialysis 5/26  Antibiotics: Vancomycin 1 dose  Meropenem 5/25>>>  HPI/Subjective: Sitting up in bed. Reports pain left flank. Denies nausea  Objective: Vitals:   06/18/18 2038 06/19/18 0940  BP: 138/74 108/66  Pulse: 94 83  Resp: 19 18  Temp: 98.9 F (37.2 C) 98.8 F (37.1 C)  SpO2: 98% 98%    Intake/Output Summary (Last 24 hours) at 06/19/2018 1001 Last data filed at 06/19/2018 7903 Gross per 24 hour  Intake 1720 ml  Output 100 ml  Net 1620 ml   Filed Weights   06/18/18 1253 06/18/18 2038 06/19/18 0127  Weight: 115.1 kg 116.4 kg 116.4 kg    Exam:   General:  Appears chronically ill no acute distress  Cardiovascular: RRR no mgr trace LE edema  Respiratory: normal effort Good air movement. No wheeze  Abdomen: obese soft non-distended +BS mild diffuse tenderness particularly left side  Musculoskeletal: joints without swelling/erythema  Skin. Dark dry area on feet and hands   Data Reviewed: Basic Metabolic Panel: Recent Labs  Lab 06/18/18 1319 06/19/18 0504  NA 135 133*  K 3.5 3.4*  CL 97* 98  CO2 20* 21*  GLUCOSE 418* 375*  BUN 52* 64*  CREATININE 6.95* 7.69*  CALCIUM 7.5* 7.3*   Liver Function Tests: Recent Labs  Lab 06/18/18 1319 06/19/18 0504  AST 14* 11*  ALT 11 10  ALKPHOS 112 100  BILITOT 0.7 0.8  PROT 6.2* 5.5*  ALBUMIN 2.1* 1.9*   Recent Labs  Lab 06/18/18 1334  LIPASE 12   No results for input(s): AMMONIA in the last 168 hours. CBC: Recent Labs  Lab 06/18/18 1319 06/19/18 0504  WBC 12.5* 8.5  NEUTROABS 10.8*  --   HGB 9.1* 8.1*  HCT 28.2* 25.0*  MCV 93.7 91.9  PLT 143* 121*  Cardiac Enzymes: No results for input(s): CKTOTAL, CKMB, CKMBINDEX, TROPONINI in the last 168 hours. BNP (last 3 results) Recent Labs    04/21/18 2209  BNP 254.9*    ProBNP (last 3 results) No results for input(s): PROBNP in the last 8760 hours.  CBG: Recent  Labs  Lab 06/18/18 1301 06/18/18 1751 06/18/18 2040 06/19/18 0648  GLUCAP 373* 392* 412* 337*    Recent Results (from the past 240 hour(s))  Culture, blood (Routine x 2)     Status: None (Preliminary result)   Collection Time: 06/18/18  1:10 PM  Result Value Ref Range Status   Specimen Description BLOOD LEFT ANTECUBITAL  Final   Special Requests   Final    BOTTLES DRAWN AEROBIC AND ANAEROBIC Blood Culture results may not be optimal due to an excessive volume of blood received in culture bottles   Culture  Setup Time   Final    GRAM NEGATIVE RODS ANAEROBIC BOTTLE ONLY CRITICAL VALUE NOTED.  VALUE IS CONSISTENT WITH PREVIOUSLY REPORTED AND CALLED VALUE. Performed at [facility], [place] [address] [place], [place] [address]    Culture GRAM NEGATIVE RODS  Final   Report Status PENDING  Incomplete  Culture, blood (Routine x 2)     Status: None (Preliminary result)   Collection Time: 06/18/18  1:35 PM  Result Value Ref Range Status   Specimen Description BLOOD LEFT HAND  Final   Special Requests   Final    BOTTLES DRAWN AEROBIC AND ANAEROBIC Blood Culture adequate volume   Culture  Setup Time   Final    GRAM NEGATIVE RODS AEROBIC BOTTLE ONLY CRITICAL RESULT CALLED TO, READ BACK BY AND VERIFIED WITH: SINCLAIRE PHARMD AT 9892 JJ [id number] BY SJW Performed at [facility], [place] [address] [place], [place] [address]    Culture GRAM NEGATIVE RODS  Final   Report Status PENDING  Incomplete  Blood Culture ID Panel (Reflexed)     Status: Abnormal   Collection Time: 06/18/18  1:35 PM  Result Value Ref Range Status   Enterococcus species NOT DETECTED NOT DETECTED Final   Listeria monocytogenes NOT DETECTED NOT DETECTED Final   Staphylococcus species NOT DETECTED NOT DETECTED Final   Staphylococcus aureus (BCID) NOT DETECTED NOT DETECTED Final   Streptococcus species NOT DETECTED NOT DETECTED Final   Streptococcus agalactiae NOT DETECTED NOT DETECTED Final   Streptococcus  pneumoniae NOT DETECTED NOT DETECTED Final   Streptococcus pyogenes NOT DETECTED NOT DETECTED Final   Acinetobacter baumannii NOT DETECTED NOT DETECTED Final   Enterobacteriaceae species DETECTED (A) NOT DETECTED Final    Comment: Enterobacteriaceae represent a large family of gram-negative bacteria, not a single organism. CRITICAL RESULT CALLED TO, READ BACK BY AND VERIFIED WITH: SINCLAIRE PHARMD AT 0911 ON [id number] BY SJW    Enterobacter cloacae complex NOT DETECTED NOT DETECTED Final   Escherichia coli NOT DETECTED NOT DETECTED Final   Klebsiella oxytoca NOT DETECTED NOT DETECTED Final   Klebsiella pneumoniae DETECTED (A) NOT DETECTED Final    Comment: CRITICAL RESULT CALLED TO, READ BACK BY AND VERIFIED WITH: SINCLAIRE PHARMD AT 4970 ON [id number] BY SJW    Proteus species NOT DETECTED NOT DETECTED Final   Serratia marcescens NOT DETECTED NOT DETECTED Final   Carbapenem resistance NOT DETECTED NOT DETECTED Final   Haemophilus influenzae NOT DETECTED NOT DETECTED Final   Neisseria meningitidis NOT DETECTED NOT DETECTED Final   Pseudomonas aeruginosa NOT DETECTED NOT DETECTED Final   Candida albicans NOT  DETECTED NOT DETECTED Final   Candida glabrata NOT DETECTED NOT DETECTED Final   Candida krusei NOT DETECTED NOT DETECTED Final   Candida parapsilosis NOT DETECTED NOT DETECTED Final   Candida tropicalis NOT DETECTED NOT DETECTED Final    Comment: Performed at [facility], [place] [address] [place], [place] [address]  SARS Coronavirus 2 (CEPHEID- Performed in [place] [facility] lab), Hosp Order     Status: None   Collection Time: 06/18/18  1:58 PM  Result Value Ref Range Status   SARS Coronavirus 2 NEGATIVE NEGATIVE Final    Comment: (NOTE) If result is NEGATIVE SARS-CoV-2 target nucleic acids are NOT DETECTED. The SARS-CoV-2 RNA is generally detectable in upper and lower  respiratory specimens during the acute phase of infection. The lowest  concentration of SARS-CoV-2 viral  copies this assay can detect is 250  copies / mL. A negative result does not preclude SARS-CoV-2 infection  and should not be used as the sole basis for treatment or other  patient management decisions.  A negative result may occur with  improper specimen collection / handling, submission of specimen other  than nasopharyngeal swab, presence of viral mutation(s) within the  areas targeted by this assay, and inadequate number of viral copies  (<250 copies / mL). A negative result must be combined with clinical  observations, patient history, and epidemiological information. If result is POSITIVE SARS-CoV-2 target nucleic acids are DETECTED. The SARS-CoV-2 RNA is generally detectable in upper and lower  respiratory specimens dur ing the acute phase of infection.  Positive  results are indicative of active infection with SARS-CoV-2.  Clinical  correlation with patient history and other diagnostic information is  necessary to determine patient infection status.  Positive results do  not rule out bacterial infection or co-infection with other viruses. If result is PRESUMPTIVE POSTIVE SARS-CoV-2 nucleic acids MAY BE PRESENT.   A presumptive positive result was obtained on the submitted specimen  and confirmed on repeat testing.  While 2019 novel coronavirus  (SARS-CoV-2) nucleic acids may be present in the submitted sample  additional confirmatory testing may be necessary for epidemiological  and / or clinical management purposes  to differentiate between  SARS-CoV-2 and other Sarbecovirus currently known to infect humans.  If clinically indicated additional testing with an alternate test  methodology [phone]) is advised. The SARS-CoV-2 RNA is generally  detectable in upper and lower respiratory sp ecimens during the acute  phase of infection. The expected result is Negative. Fact Sheet for Patients:  StrictlyIdeas.no Fact Sheet for Healthcare  Providers: BankingDealers.co.za This test is not yet approved or cleared by the [place] FDA and has been authorized for detection and/or diagnosis of SARS-CoV-2 by FDA under an Emergency Use Authorization (EUA).  This EUA will remain in effect (meaning this test can be used) for the duration of the COVID-19 declaration under Section 564(b)(1) of the Act, 21 U.S.C. section 360bbb-3(b)(1), unless the authorization is terminated or revoked sooner. Performed at [facility], [place] [address] [place], [place] [address]      Studies: Ct Abdomen Pelvis Wo Contrast  Result Date: 06/18/2018 CLINICAL DATA:  Abdominal pain, fever, abscess suspected EXAM: CT ABDOMEN AND PELVIS WITHOUT CONTRAST TECHNIQUE: Multidetector CT imaging of the abdomen and pelvis was performed following the standard protocol without IV contrast. COMPARISON:  05/19/2018 FINDINGS: Lower chest: No acute abnormality.  Artery calcifications. Hepatobiliary: Coarse contour of the liver. Status post cholecystectomy. No biliary dilatation. Pancreas: Pancreatic parenchymal calcifications and  atrophy. No pancreatic ductal dilatation or surrounding inflammatory changes. Spleen: Splenomegaly, maximum span approximately 18.7 cm. Adrenals/Urinary Tract: Adrenal glands are unremarkable. There is extensive perirenal and adjacent retroperitoneal fat stranding, similar in appearance to prior examination. Unchanged 10 mm calculus in the right renal pelvis and punctuate nonobstructive calculi in the left kidney. No hydronephrosis. Mild thickening and fat stranding about the urinary bladder. Stomach/Bowel: Stomach is within normal limits. Appendix is not clearly visualized and may be surgically absent. No evidence of bowel wall thickening, distention, or inflammatory changes. Vascular/Lymphatic: No significant vascular findings are present. No enlarged abdominal or pelvic lymph nodes. Reproductive: No mass or other  abnormality. Other: No abdominal wall hernia or abnormality. Evidence of prior umbilical hernia repair. No abdominopelvic ascites. Musculoskeletal: No acute or significant osseous findings. IMPRESSION: 1. There is extensive perirenal and adjacent retroperitoneal fat stranding, similar in appearance to prior examination. Unchanged 10 mm calculus in the right renal pelvis and punctuate nonobstructive calculi in the left kidney. No hydronephrosis. Mild thickening and fat stranding about the urinary bladder. This constellation of findings is similar to prior examination and may reflect recurrent or ongoing infectious or inflammatory pyelonephritis/cystitis. Correlate with urinalysis. 2.  Stigmata of cirrhosis and splenomegaly. 3.  Stigmata of chronic pancreatitis. Electronically Signed   By: Eddie Candle M.D.   On: 06/18/2018 15:54   Dg Chest Port 1 View  Result Date: 06/18/2018 CLINICAL DATA:  Fever, vomiting, shortness of breath. EXAM: PORTABLE CHEST 1 VIEW COMPARISON:  Chest x-ray from earlier same day. Chest x-ray dated 05/21/2018. FINDINGS: Heart size is upper normal. Lungs are clear. No pleural effusion or pneumothorax seen. Dialysis catheter is stable in position with tip at the level of the cavoatrial junction. Osseous structures about the chest are unremarkable. IMPRESSION: No active disease. No evidence of pneumonia or pulmonary edema. Electronically Signed   By: Franki Cabot M.D.   On: 06/18/2018 13:58    Scheduled Meds: . calcium acetate  667 mg Oral TID WC  . carbamazepine  700 mg Oral BID  . Chlorhexidine Gluconate Cloth  6 each Topical Q0600  . cholecalciferol  2,000 Units Oral Daily  . [START ON 06/22/2018] darbepoetin (ARANESP) injection - DIALYSIS  100 mcg Intravenous Q Fri-HD  . feeding supplement (NEPRO CARB STEADY)  237 mL Oral BID BM  . feeding supplement (PRO-STAT SUGAR FREE 64)  30 mL Oral BID  . ferrous sulfate  325 mg Oral Q breakfast  . heparin  5,000 Units Subcutaneous Q8H  .  insulin aspart  0-5 Units Subcutaneous QHS  . insulin aspart  0-9 Units Subcutaneous TID WC  . insulin aspart  3 Units Subcutaneous TID WC  . insulin glargine  15 Units Subcutaneous QHS  . lactulose  10 g Oral Q T,Th,S,Su  . lactulose  20 g Oral Q M,W,F  . lipase/protease/amylase  12,000 Units Oral TID WC  . multivitamin  1 tablet Oral QHS  . PHENobarbital  226.8 mg Oral QHS  . simvastatin  20 mg Oral QHS  . tamsulosin  0.4 mg Oral QHS   Continuous Infusions: . meropenem (MERREM) IV Stopped (06/18/18 1502)    Principal Problem:   Bacteremia due to Klebsiella pneumoniae Active Problems:   ESRD (end stage renal disease) on dialysis (HCC)   Benign essential HTN   Diabetes mellitus type 2, uncontrolled, with complications (HCC)   Thrombocytopenia (HCC)   Anemia due to chronic kidney disease   Seizures (HCC)   Leukocytosis    Time spent: 45  minutes    Radene Gunning NP [facility]  If 7PM-7AM, please contact night-coverage at [email or web address], password [facility] 06/19/2018, 10:01 AM  LOS: 0 days

## 2018-06-19 NOTE — Progress Notes (Signed)
Inpatient Diabetes Program Recommendations  AACE/ADA: New Consensus Statement on Inpatient Glycemic Control (2015)  Target Ranges:  Prepandial:   less than 140 mg/dL      Peak postprandial:   less than 180 mg/dL (1-2 hours)      Critically ill patients:  140 - 180 mg/dL   Lab Results  Component Value Date   GLUCAP 383 (H) 06/19/2018   HGBA1C 7.2 (H) 06/19/2018    Review of Glycemic Control Results for Eddie Davies, Eddie Davies (MRN [id number]) as of 06/19/2018 12:17  Ref. Range 06/18/2018 20:40 06/19/2018 06:48 06/19/2018 11:15  Glucose-Capillary Latest Ref Range: 70 - 99 mg/dL 412 (H) 337 (H) 383 (H)   Diabetes history: Type 2 DM Outpatient Diabetes medications: Novolog 0-15 units TID, Basaglar 13 units QHS Current orders for Inpatient glycemic control: Novolog 0-9 units TID, Novolog 0-5 units QHS, Novolog 3 units TID, Lantus 15 units QHS  Inpatient Diabetes Program Recommendations:    Noted trends are far exceeding inpatient goals, even with home regimen. Attributing this to current infection. These trends do not reflect previous glucose trends from last admission nor A1C.   Consider:  -Increasing Novolog to 6 units TID (assuming patient is consuming >50% of meal) - Increase Lantus to 12 units BID  Please note that insulin MUST be given within the hour of the last CBG, as this could be an inaccurate measure of SSI and attribute to hypoglycemia.   Thanks, Bronson Curb, MSN, RNC-OB Diabetes Coordinator [phone] (8a-5p)

## 2018-06-20 LAB — CBC
HCT: 23.8 % — ABNORMAL LOW (ref 39.0–52.0)
HCT: 26.4 % — ABNORMAL LOW (ref 39.0–52.0)
Hemoglobin: 7.6 g/dL — ABNORMAL LOW (ref 13.0–17.0)
Hemoglobin: 8.5 g/dL — ABNORMAL LOW (ref 13.0–17.0)
MCH: 29.5 pg (ref 26.0–34.0)
MCH: 29.9 pg (ref 26.0–34.0)
MCHC: 31.9 g/dL (ref 30.0–36.0)
MCHC: 32.2 g/dL (ref 30.0–36.0)
MCV: 92.2 fL (ref 80.0–100.0)
MCV: 93 fL (ref 80.0–100.0)
Platelets: 110 10*3/uL — ABNORMAL LOW (ref 150–400)
Platelets: 120 10*3/uL — ABNORMAL LOW (ref 150–400)
RBC: 2.58 MIL/uL — ABNORMAL LOW (ref 4.22–5.81)
RBC: 2.84 MIL/uL — ABNORMAL LOW (ref 4.22–5.81)
RDW: 16 % — ABNORMAL HIGH (ref 11.5–15.5)
RDW: 16.3 % — ABNORMAL HIGH (ref 11.5–15.5)
WBC: 6.3 10*3/uL (ref 4.0–10.5)
WBC: 6.2 10*3/uL (ref 4.0–10.5)
nRBC: 0 % (ref 0.0–0.2)
nRBC: 0 % (ref 0.0–0.2)

## 2018-06-20 LAB — URINE CULTURE: Culture: >=100000 — AB

## 2018-06-20 LAB — RENAL FUNCTION PANEL
Albumin: 2 g/dL — ABNORMAL LOW (ref 3.5–5.0)
Anion gap: 12 (ref 5–15)
BUN: 32 mg/dL — ABNORMAL HIGH (ref 6–20)
CO2: 24 mmol/L (ref 22–32)
Calcium: 7.6 mg/dL — ABNORMAL LOW (ref 8.9–10.3)
Chloride: 98 mmol/L (ref 98–111)
Creatinine, Ser: 5 mg/dL — ABNORMAL HIGH (ref 0.61–1.24)
GFR calc Af Amer: 15 mL/min — ABNORMAL LOW (ref >60)
GFR calc non Af Amer: 13 mL/min — ABNORMAL LOW (ref >60)
Glucose, Bld: 240 mg/dL — ABNORMAL HIGH (ref 70–99)
Phosphorus: 2.8 mg/dL (ref 2.5–4.6)
Potassium: 3 mmol/L — ABNORMAL LOW (ref 3.5–5.1)
Sodium: 134 mmol/L — ABNORMAL LOW (ref 135–145)

## 2018-06-20 LAB — BASIC METABOLIC PANEL
Anion gap: 13 (ref 5–15)
BUN: 25 mg/dL — ABNORMAL HIGH (ref 6–20)
CO2: 24 mmol/L (ref 22–32)
Calcium: 7.4 mg/dL — ABNORMAL LOW (ref 8.9–10.3)
Chloride: 96 mmol/L — ABNORMAL LOW (ref 98–111)
Creatinine, Ser: 4.34 mg/dL — ABNORMAL HIGH (ref 0.61–1.24)
GFR calc Af Amer: 18 mL/min — ABNORMAL LOW (ref >60)
GFR calc non Af Amer: 15 mL/min — ABNORMAL LOW (ref >60)
Glucose, Bld: 379 mg/dL — ABNORMAL HIGH (ref 70–99)
Potassium: 2.8 mmol/L — ABNORMAL LOW (ref 3.5–5.1)
Sodium: 133 mmol/L — ABNORMAL LOW (ref 135–145)

## 2018-06-20 LAB — GLUCOSE, CAPILLARY
Glucose-Capillary: 361 mg/dL — ABNORMAL HIGH (ref 70–99)
Glucose-Capillary: 372 mg/dL — ABNORMAL HIGH (ref 70–99)
Glucose-Capillary: 335 mg/dL — ABNORMAL HIGH (ref 70–99)
Glucose-Capillary: 208 mg/dL — ABNORMAL HIGH (ref 70–99)
Glucose-Capillary: 220 mg/dL — ABNORMAL HIGH (ref 70–99)

## 2018-06-20 MED ORDER — DARBEPOETIN ALFA 200 MCG/0.4ML IJ SOSY
200.0000 ug | PREFILLED_SYRINGE | INTRAMUSCULAR | Status: DC
  Administered 2018-06-21: 200 ug via INTRAVENOUS
  Filled 2018-06-20: qty 0.4

## 2018-06-20 MED ORDER — POTASSIUM CHLORIDE CRYS ER 20 MEQ PO TBCR
40.0000 meq | EXTENDED_RELEASE_TABLET | Freq: Once | ORAL | Status: AC
  Administered 2018-06-20: 40 meq via ORAL
  Filled 2018-06-20: qty 2

## 2018-06-20 NOTE — Progress Notes (Addendum)
[facility] Progress Note   Subjective:  Seen in room. No new complaints - no CP/dyspnea. Febrile again last night.   Objective Vitals:   06/19/18 2229 06/20/18 0300 06/20/18 0346 06/20/18 0841  BP: 119/70  (!) 113/57 127/69  Pulse: 83  83 75  Resp: (!) 24  (!) 24 18  Temp: (!) 100.7 F (38.2 C)  99.3 F (37.4 C) 99.5 F (37.5 C)  TempSrc: Oral  Oral Oral  SpO2: 97%  93% 96%  Weight:  107.2 kg    Height:       Physical Exam General: Overweight man, NAD Heart: RRR; no murmur Lungs: CTA anteriorly Abdomen: soft, non-tender Extremities: No LE edema; scaly feet/legs - likely icthyosis Dialysis Access: [facility]  Additional Objective Labs: Basic Metabolic Panel: Recent Labs  Lab 06/18/18 1319 06/19/18 0504 06/20/18 0427  NA 135 133* 133*  K 3.5 3.4* 2.8*  CL 97* 98 96*  CO2 20* 21* 24  GLUCOSE 418* 375* 379*  BUN 52* 64* 25*  CREATININE 6.95* 7.69* 4.34*  CALCIUM 7.5* 7.3* 7.4*   Liver Function Tests: Recent Labs  Lab 06/18/18 1319 06/19/18 0504  AST 14* 11*  ALT 11 10  ALKPHOS 112 100  BILITOT 0.7 0.8  PROT 6.2* 5.5*  ALBUMIN 2.1* 1.9*   Recent Labs  Lab 06/18/18 1334  LIPASE 12   CBC: Recent Labs  Lab 06/18/18 1319 06/19/18 0504 06/20/18 0427  WBC 12.5* 8.5 6.3  NEUTROABS 10.8*  --   --   HGB 9.1* 8.1* 7.6*  HCT 28.2* 25.0* 23.8*  MCV 93.7 91.9 92.2  PLT 143* 121* 110*   Blood Culture    Component Value Date/Time   SDES URINE, CLEAN CATCH 06/18/2018 2245   SPECREQUEST  06/18/2018 2245    URINE, CLEAN CATCH Performed at [facility], [place] [address], [place], [place] [address]    CULT (A) 06/18/2018 2245    >=100,000 COLONIES/mL KLEBSIELLA PNEUMONIAE Confirmed Extended Spectrum Beta-Lactamase Producer (ESBL).  In bloodstream infections from ESBL organisms, carbapenems are preferred over piperacillin/tazobactam. They are shown to have a lower risk of mortality.    REPTSTATUS 06/20/2018 FINAL 06/18/2018 2245    CBG: Recent Labs  Lab 06/19/18 1115 06/19/18 1742 06/19/18 2145 06/20/18 0548 06/20/18 0732  GLUCAP 383* 142* 260* 361* 372*   Studies/Results: Ct Abdomen Pelvis Wo Contrast  Result Date: 06/18/2018 CLINICAL DATA:  Abdominal pain, fever, abscess suspected EXAM: CT ABDOMEN AND PELVIS WITHOUT CONTRAST TECHNIQUE: Multidetector CT imaging of the abdomen and pelvis was performed following the standard protocol without IV contrast. COMPARISON:  05/19/2018 FINDINGS: Lower chest: No acute abnormality.  Artery calcifications. Hepatobiliary: Coarse contour of the liver. Status post cholecystectomy. No biliary dilatation. Pancreas: Pancreatic parenchymal calcifications and atrophy. No pancreatic ductal dilatation or surrounding inflammatory changes. Spleen: Splenomegaly, maximum span approximately 18.7 cm. Adrenals/Urinary Tract: Adrenal glands are unremarkable. There is extensive perirenal and adjacent retroperitoneal fat stranding, similar in appearance to prior examination. Unchanged 10 mm calculus in the right renal pelvis and punctuate nonobstructive calculi in the left kidney. No hydronephrosis. Mild thickening and fat stranding about the urinary bladder. Stomach/Bowel: Stomach is within normal limits. Appendix is not clearly visualized and may be surgically absent. No evidence of bowel wall thickening, distention, or inflammatory changes. Vascular/Lymphatic: No significant vascular findings are present. No enlarged abdominal or pelvic lymph nodes. Reproductive: No mass or other abnormality. Other: No abdominal wall hernia or abnormality. Evidence of prior umbilical hernia repair. No abdominopelvic ascites. Musculoskeletal:  No acute or significant osseous findings. IMPRESSION: 1. There is extensive perirenal and adjacent retroperitoneal fat stranding, similar in appearance to prior examination. Unchanged 10 mm calculus in the right renal pelvis and punctuate nonobstructive calculi in the left kidney. No  hydronephrosis. Mild thickening and fat stranding about the urinary bladder. This constellation of findings is similar to prior examination and may reflect recurrent or ongoing infectious or inflammatory pyelonephritis/cystitis. Correlate with urinalysis. 2.  Stigmata of cirrhosis and splenomegaly. 3.  Stigmata of chronic pancreatitis. Electronically Signed   By: Eddie Candle M.D.   On: 06/18/2018 15:54   Dg Chest Port 1 View  Result Date: 06/18/2018 CLINICAL DATA:  Fever, vomiting, shortness of breath. EXAM: PORTABLE CHEST 1 VIEW COMPARISON:  Chest x-ray from earlier same day. Chest x-ray dated 05/21/2018. FINDINGS: Heart size is upper normal. Lungs are clear. No pleural effusion or pneumothorax seen. Dialysis catheter is stable in position with tip at the level of the cavoatrial junction. Osseous structures about the chest are unremarkable. IMPRESSION: No active disease. No evidence of pneumonia or pulmonary edema. Electronically Signed   By: Franki Cabot M.D.   On: 06/18/2018 13:58   Medications: . meropenem (MERREM) IV 500 mg (06/19/18 1805)   . calcium acetate  667 mg Oral TID WC  . carbamazepine  700 mg Oral BID  . Chlorhexidine Gluconate Cloth  6 each Topical Q0600  . cholecalciferol  2,000 Units Oral Daily  . [START ON 06/22/2018] darbepoetin (ARANESP) injection - DIALYSIS  100 mcg Intravenous Q Fri-HD  . feeding supplement (NEPRO CARB STEADY)  237 mL Oral BID BM  . feeding supplement (PRO-STAT SUGAR FREE 64)  30 mL Oral BID  . ferrous sulfate  325 mg Oral Q breakfast  . heparin  5,000 Units Subcutaneous Q8H  . insulin aspart  0-5 Units Subcutaneous QHS  . insulin aspart  0-9 Units Subcutaneous TID WC  . insulin aspart  6 Units Subcutaneous TID WC  . insulin glargine  12 Units Subcutaneous BID  . lactulose  10 g Oral Q T,Th,S,Su  . lactulose  20 g Oral Q M,W,F  . lipase/protease/amylase  12,000 Units Oral TID WC  . multivitamin  1 tablet Oral QHS  . PHENobarbital  226.8 mg Oral QHS   . simvastatin  20 mg Oral QHS  . tamsulosin  0.4 mg Oral QHS    Dialysis Orders: AKI/ [place] MWF 3.5h 400/800 114.5kg leaving below 3K/2.5Ca bath RIJ TDC Hep 3000 Venofer through 6/5  Mircera 100 last 5/15  no VDRA  Labs: hgb 9 18% sat Ca/P slight ^ -recently started on Ca acetate 1 ac iPTH ok Glu high 300 - 500s Cr 5.37 5/13 Cr 4.6 5/18  Assessment/Plan: 1. Pyelonephritis/Klebsiella bacteremia: BCx and UCx 5/25 growing Klebsiella. COVID negative. On meropenem. 2. AKI: On HD since 4/30 on MWF schedule, has been off schedule here. S/p HD 5/26 - will plan on next tomorrow if BUN/Cr climbing again. K low today - give KCl 16mq today, then will use higher K bath with next dialysis. Given acute status - continue to avoid NSAIDs, contrast where possible, ACE/ARBs and baclofen 3. Hypertension/volume: BP good, weights variable - trying to avoid hypotension. 4. Anemia: Hgb 7.6, holding venofer d/t bacteremia. Will resume Aranesp 200 q week. 5. Metabolic bone disease: Corr Ca ok, Phos pending.No binders or VDRA needed 6. Nutrition: Alb very low. Hx cirrhosis plus multiple hospitalizations. Continue Nepro/Pro-stat. 7. DM: Last A1c 8.3 in March - sugars seem to be running  high in outpatient setting. ?Poor control vs infection 8. Cirrhosis: On lactulose. 9. H/o cognitive delay  10. H/o debility: SNF dependent x about 1 year  Veneta Penton, Hershal Coria 06/20/2018, 11:17 AM  [facility] Pager: [phone]  Pt seen, examined and agree w A/P as above.  Kelly Splinter  MD 06/20/2018, 11:43 AM

## 2018-06-20 NOTE — Progress Notes (Signed)
PROGRESS NOTE    Eddie Davies  XBD:[id number] DOB: [birth year] DOA: 06/18/2018 PCP: [facility]   Brief Narrative:  48 year old with history of ESRD on hemodialysis since 05/24/2018, bilateral nonobstructive nephrolithiasis, chronic pancreatitis, diabetes mellitus type 2, cirrhosis, thrombocytopenia, anemia, ESBL/Klebsiella UTI in the past presented with fever abdominal pain.  CT of the abdomen pelvis showed concerns for pyelonephritis/cystitis without hydronephrosis.  COVID-negative.  Bacteremia-Klebsiella.  Currently on meropenem.   Assessment & Plan:   Principal Problem:   Bacteremia due to Klebsiella pneumoniae Active Problems:   Seizures ([place])   Benign essential HTN   Diabetes mellitus type 2, uncontrolled, with complications (HCC)   Thrombocytopenia (HCC)   Fever   ESRD (end stage renal disease) on dialysis (HCC)   Anemia due to chronic kidney disease   Leukocytosis  Bacteremia due to Klebsiella pneumonia Urosepsis with concerns for pyelonephritis/cystitis - Likely the source of this is urine.  Initial blood cultures from 5/25+.  Repeat surveillance cultures today. - Sensitivities reviewed, continue IV meropenem. -Continue supportive care.  COVID-negative  Acute kidney injury on hemodialysis - Started on hemodialysis 4/30 on Monday Wednesday Friday schedule.  Nephrology team is following.  Dialysis sessions per their service.  Anemia of chronic disease - Venofer is on hold due to bacteremia.  Continue Aranesp  Uncontrolled insulin-dependent diabetes mellitus type 2 with renal complications -Increase NovoLog to 6 units 3 times daily with meals, increase Lantus to 12 units twice daily - Hemoglobin A1c once ago 8.3.  History of cirrhosis -On lactulose  Chronic thrombocytopenia -No obvious signs of bleeding.  Liver cirrhosis.  Continue to monitor this.  History of seizures -Continue home regimen of phenobarbital and Tegretol  Generalized  deconditioning -PT eval-recommending SNF   DVT prophylaxis: Subcutaneous heparin Code Status: Full code Family Communication: None at bedside Disposition Plan: Requires IV antibiotics while he is bacteremic.  Maintain hospital stay.  Unsafe for discharge until his bacteremia has cleared  Consultants:   Nephrology  Procedures:   None  Antimicrobials:   Urine culture-gram-negative rods  Blood cultures-gram-negative rods   Subjective: Overall patient states he feels quite weak.  Tells me overall he is always bedbound and uses wheelchair to get around.  Review of Systems Otherwise negative except as per HPI, including: General: Denies fever, chills, night sweats or unintended weight loss. Resp: Denies cough, wheezing, shortness of breath. Cardiac: Denies chest pain, palpitations, orthopnea, paroxysmal nocturnal dyspnea. GI: Denies abdominal pain, nausea, vomiting, diarrhea or constipation GU: Denies dysuria, frequency, hesitancy or incontinence MS: Denies muscle aches, joint pain or swelling Neuro: Denies headache, neurologic deficits (focal weakness, numbness, tingling), abnormal gait Psych: Denies anxiety, depression, SI/HI/AVH Skin: Denies new rashes or lesions ID: Denies sick contacts, exotic exposures, travel  Objective: Vitals:   06/19/18 1739 06/19/18 2229 06/20/18 0300 06/20/18 0346  BP: (!) 144/72 119/70  (!) 113/57  Pulse: (!) 104 83  83  Resp: 18 (!) 24  (!) 24  Temp:  (!) 100.7 F (38.2 C)  99.3 F (37.4 C)  TempSrc:  Oral  Oral  SpO2: 97% 97%  93%  Weight:   107.2 kg   Height:        Intake/Output Summary (Last 24 hours) at 06/20/2018 0817 Last data filed at 06/20/2018 0500 Gross per 24 hour  Intake 917 ml  Output 1100 ml  Net -183 ml   Filed Weights   06/19/18 1255 06/19/18 1655 06/20/18 0300  Weight: 109 kg 107.9 kg 107.2 kg    Examination:  General exam: Appears calm and comfortable  Respiratory system: Clear to auscultation. Respiratory  effort normal. Cardiovascular system: S1 & S2 heard, RRR. No JVD, murmurs, rubs, gallops or clicks. No pedal edema. Gastrointestinal system: Abdomen is nondistended, soft and nontender. No organomegaly or masses felt. Normal bowel sounds heard. Central nervous system: Alert and oriented. No focal neurological deficits. Extremities: Symmetric 3+ x 5 power. Skin: Bilateral lower extremity skin color changes which is chronic. Psychiatry: Judgement and insight appear normal. Mood & affect appropriate.     Data Reviewed:   CBC: Recent Labs  Lab 06/18/18 1319 06/19/18 0504 06/20/18 0427  WBC 12.5* 8.5 6.3  NEUTROABS 10.8*  --   --   HGB 9.1* 8.1* 7.6*  HCT 28.2* 25.0* 23.8*  MCV 93.7 91.9 92.2  PLT 143* 121* 166*   Basic Metabolic Panel: Recent Labs  Lab 06/18/18 1319 06/19/18 0504 06/20/18 0427  NA 135 133* 133*  K 3.5 3.4* 2.8*  CL 97* 98 96*  CO2 20* 21* 24  GLUCOSE 418* 375* 379*  BUN 52* 64* 25*  CREATININE 6.95* 7.69* 4.34*  CALCIUM 7.5* 7.3* 7.4*   GFR: Estimated Creatinine Clearance: 27.4 mL/min (A) (by C-G formula based on SCr of 4.34 mg/dL (H)). Liver Function Tests: Recent Labs  Lab 06/18/18 1319 06/19/18 0504  AST 14* 11*  ALT 11 10  ALKPHOS 112 100  BILITOT 0.7 0.8  PROT 6.2* 5.5*  ALBUMIN 2.1* 1.9*   Recent Labs  Lab 06/18/18 1334  LIPASE 12   No results for input(s): AMMONIA in the last 168 hours. Coagulation Profile: Recent Labs  Lab 06/18/18 1319  INR 1.9*   Cardiac Enzymes: No results for input(s): CKTOTAL, CKMB, CKMBINDEX, TROPONINI in the last 168 hours. BNP (last 3 results) No results for input(s): PROBNP in the last 8760 hours. HbA1C: Recent Labs    06/19/18 0504  HGBA1C 7.2*   CBG: Recent Labs  Lab 06/19/18 1115 06/19/18 1742 06/19/18 2145 06/20/18 0548 06/20/18 0732  GLUCAP 383* 142* 260* 361* 372*   Lipid Profile: No results for input(s): CHOL, HDL, LDLCALC, TRIG, CHOLHDL, LDLDIRECT in the last 72  hours. Thyroid Function Tests: No results for input(s): TSH, T4TOTAL, FREET4, T3FREE, THYROIDAB in the last 72 hours. Anemia Panel: No results for input(s): VITAMINB12, FOLATE, FERRITIN, TIBC, IRON, RETICCTPCT in the last 72 hours. Sepsis Labs: Recent Labs  Lab 06/18/18 1310  LATICACIDVEN 1.1    Recent Results (from the past 240 hour(s))  Culture, blood (Routine x 2)     Status: None (Preliminary result)   Collection Time: 06/18/18  1:10 PM  Result Value Ref Range Status   Specimen Description BLOOD LEFT ANTECUBITAL  Final   Special Requests   Final    BOTTLES DRAWN AEROBIC AND ANAEROBIC Blood Culture results may not be optimal due to an excessive volume of blood received in culture bottles   Culture  Setup Time   Final    GRAM NEGATIVE RODS IN BOTH AEROBIC AND ANAEROBIC BOTTLES CRITICAL VALUE NOTED.  VALUE IS CONSISTENT WITH PREVIOUSLY REPORTED AND CALLED VALUE. Performed at [facility], [place] [address], [place], [place] [address]    Culture GRAM NEGATIVE RODS  Final   Report Status PENDING  Incomplete  Culture, blood (Routine x 2)     Status: None (Preliminary result)   Collection Time: 06/18/18  1:35 PM  Result Value Ref Range Status   Specimen Description BLOOD LEFT HAND  Final   Special Requests   Final  BOTTLES DRAWN AEROBIC AND ANAEROBIC Blood Culture adequate volume   Culture  Setup Time   Final    GRAM NEGATIVE RODS IN BOTH AEROBIC AND ANAEROBIC BOTTLES CRITICAL RESULT CALLED TO, READ BACK BY AND VERIFIED WITH: SINCLAIRE PHARMD AT 0911 ON [id number] BY SJW Performed at [facility], [place] [address] [place], [place] [address]    Culture GRAM NEGATIVE RODS  Final   Report Status PENDING  Incomplete  Blood Culture ID Panel (Reflexed)     Status: Abnormal   Collection Time: 06/18/18  1:35 PM  Result Value Ref Range Status   Enterococcus species NOT DETECTED NOT DETECTED Final   Listeria monocytogenes NOT DETECTED NOT DETECTED Final   Staphylococcus species  NOT DETECTED NOT DETECTED Final   Staphylococcus aureus (BCID) NOT DETECTED NOT DETECTED Final   Streptococcus species NOT DETECTED NOT DETECTED Final   Streptococcus agalactiae NOT DETECTED NOT DETECTED Final   Streptococcus pneumoniae NOT DETECTED NOT DETECTED Final   Streptococcus pyogenes NOT DETECTED NOT DETECTED Final   Acinetobacter baumannii NOT DETECTED NOT DETECTED Final   Enterobacteriaceae species DETECTED (A) NOT DETECTED Final    Comment: Enterobacteriaceae represent a large family of gram-negative bacteria, not a single organism. CRITICAL RESULT CALLED TO, READ BACK BY AND VERIFIED WITH: SINCLAIRE PHARMD AT 0911 ON [id number] BY SJW    Enterobacter cloacae complex NOT DETECTED NOT DETECTED Final   Escherichia coli NOT DETECTED NOT DETECTED Final   Klebsiella oxytoca NOT DETECTED NOT DETECTED Final   Klebsiella pneumoniae DETECTED (A) NOT DETECTED Final    Comment: CRITICAL RESULT CALLED TO, READ BACK BY AND VERIFIED WITH: SINCLAIRE PHARMD AT 9381 ON [id number] BY SJW    Proteus species NOT DETECTED NOT DETECTED Final   Serratia marcescens NOT DETECTED NOT DETECTED Final   Carbapenem resistance NOT DETECTED NOT DETECTED Final   Haemophilus influenzae NOT DETECTED NOT DETECTED Final   Neisseria meningitidis NOT DETECTED NOT DETECTED Final   Pseudomonas aeruginosa NOT DETECTED NOT DETECTED Final   Candida albicans NOT DETECTED NOT DETECTED Final   Candida glabrata NOT DETECTED NOT DETECTED Final   Candida krusei NOT DETECTED NOT DETECTED Final   Candida parapsilosis NOT DETECTED NOT DETECTED Final   Candida tropicalis NOT DETECTED NOT DETECTED Final    Comment: Performed at [facility], [place] [address], [place], [place] [address]  SARS Coronavirus 2 (CEPHEID- Performed in [place] [facility] lab), Hosp Order     Status: None   Collection Time: 06/18/18  1:58 PM  Result Value Ref Range Status   SARS Coronavirus 2 NEGATIVE NEGATIVE Final    Comment: (NOTE) If result is  NEGATIVE SARS-CoV-2 target nucleic acids are NOT DETECTED. The SARS-CoV-2 RNA is generally detectable in upper and lower  respiratory specimens during the acute phase of infection. The lowest  concentration of SARS-CoV-2 viral copies this assay can detect is 250  copies / mL. A negative result does not preclude SARS-CoV-2 infection  and should not be used as the sole basis for treatment or other  patient management decisions.  A negative result may occur with  improper specimen collection / handling, submission of specimen other  than nasopharyngeal swab, presence of viral mutation(s) within the  areas targeted by this assay, and inadequate number of viral copies  (<250 copies / mL). A negative result must be combined with clinical  observations, patient history, and epidemiological information. If result is POSITIVE SARS-CoV-2 target nucleic acids are DETECTED. The SARS-CoV-2 RNA is  generally detectable in upper and lower  respiratory specimens dur ing the acute phase of infection.  Positive  results are indicative of active infection with SARS-CoV-2.  Clinical  correlation with patient history and other diagnostic information is  necessary to determine patient infection status.  Positive results do  not rule out bacterial infection or co-infection with other viruses. If result is PRESUMPTIVE POSTIVE SARS-CoV-2 nucleic acids MAY BE PRESENT.   A presumptive positive result was obtained on the submitted specimen  and confirmed on repeat testing.  While 2019 novel coronavirus  (SARS-CoV-2) nucleic acids may be present in the submitted sample  additional confirmatory testing may be necessary for epidemiological  and / or clinical management purposes  to differentiate between  SARS-CoV-2 and other Sarbecovirus currently known to infect humans.  If clinically indicated additional testing with an alternate test  methodology [phone]) is advised. The SARS-CoV-2 RNA is generally  detectable  in upper and lower respiratory sp ecimens during the acute  phase of infection. The expected result is Negative. Fact Sheet for Patients:  StrictlyIdeas.no Fact Sheet for Healthcare Providers: BankingDealers.co.za This test is not yet approved or cleared by the [place] FDA and has been authorized for detection and/or diagnosis of SARS-CoV-2 by FDA under an Emergency Use Authorization (EUA).  This EUA will remain in effect (meaning this test can be used) for the duration of the COVID-19 declaration under Section 564(b)(1) of the Act, 21 U.S.C. section 360bbb-3(b)(1), unless the authorization is terminated or revoked sooner. Performed at [facility], [place] [address], [place], [place] [address]   Culture, Urine     Status: Abnormal (Preliminary result)   Collection Time: 06/18/18 10:45 PM  Result Value Ref Range Status   Specimen Description URINE, CLEAN CATCH  Final   Special Requests URINE, CLEAN CATCH  Final   Culture (A)  Final    >=100,000 COLONIES/mL GRAM NEGATIVE RODS IDENTIFICATION AND SUSCEPTIBILITIES TO FOLLOW Performed at [facility], [address] [place], [place] [address]    Report Status PENDING  Incomplete         Radiology Studies: Ct Abdomen Pelvis Wo Contrast  Result Date: 06/18/2018 CLINICAL DATA:  Abdominal pain, fever, abscess suspected EXAM: CT ABDOMEN AND PELVIS WITHOUT CONTRAST TECHNIQUE: Multidetector CT imaging of the abdomen and pelvis was performed following the standard protocol without IV contrast. COMPARISON:  05/19/2018 FINDINGS: Lower chest: No acute abnormality.  Artery calcifications. Hepatobiliary: Coarse contour of the liver. Status post cholecystectomy. No biliary dilatation. Pancreas: Pancreatic parenchymal calcifications and atrophy. No pancreatic ductal dilatation or surrounding inflammatory changes. Spleen: Splenomegaly, maximum span approximately 18.7 cm. Adrenals/Urinary  Tract: Adrenal glands are unremarkable. There is extensive perirenal and adjacent retroperitoneal fat stranding, similar in appearance to prior examination. Unchanged 10 mm calculus in the right renal pelvis and punctuate nonobstructive calculi in the left kidney. No hydronephrosis. Mild thickening and fat stranding about the urinary bladder. Stomach/Bowel: Stomach is within normal limits. Appendix is not clearly visualized and may be surgically absent. No evidence of bowel wall thickening, distention, or inflammatory changes. Vascular/Lymphatic: No significant vascular findings are present. No enlarged abdominal or pelvic lymph nodes. Reproductive: No mass or other abnormality. Other: No abdominal wall hernia or abnormality. Evidence of prior umbilical hernia repair. No abdominopelvic ascites. Musculoskeletal: No acute or significant osseous findings. IMPRESSION: 1. There is extensive perirenal and adjacent retroperitoneal fat stranding, similar in appearance to prior examination. Unchanged 10 mm calculus in the right renal pelvis and punctuate nonobstructive  calculi in the left kidney. No hydronephrosis. Mild thickening and fat stranding about the urinary bladder. This constellation of findings is similar to prior examination and may reflect recurrent or ongoing infectious or inflammatory pyelonephritis/cystitis. Correlate with urinalysis. 2.  Stigmata of cirrhosis and splenomegaly. 3.  Stigmata of chronic pancreatitis. Electronically Signed   By: Eddie Candle M.D.   On: 06/18/2018 15:54   Dg Chest Port 1 View  Result Date: 06/18/2018 CLINICAL DATA:  Fever, vomiting, shortness of breath. EXAM: PORTABLE CHEST 1 VIEW COMPARISON:  Chest x-ray from earlier same day. Chest x-ray dated 05/21/2018. FINDINGS: Heart size is upper normal. Lungs are clear. No pleural effusion or pneumothorax seen. Dialysis catheter is stable in position with tip at the level of the cavoatrial junction. Osseous structures about the chest  are unremarkable. IMPRESSION: No active disease. No evidence of pneumonia or pulmonary edema. Electronically Signed   By: Franki Cabot M.D.   On: 06/18/2018 13:58        Scheduled Meds:  calcium acetate  667 mg Oral TID WC   carbamazepine  700 mg Oral BID   Chlorhexidine Gluconate Cloth  6 each Topical Q0600   cholecalciferol  2,000 Units Oral Daily   [START ON 06/22/2018] darbepoetin (ARANESP) injection - DIALYSIS  100 mcg Intravenous Q Fri-HD   feeding supplement (NEPRO CARB STEADY)  237 mL Oral BID BM   feeding supplement (PRO-STAT SUGAR FREE 64)  30 mL Oral BID   ferrous sulfate  325 mg Oral Q breakfast   heparin  5,000 Units Subcutaneous Q8H   insulin aspart  0-5 Units Subcutaneous QHS   insulin aspart  0-9 Units Subcutaneous TID WC   insulin aspart  6 Units Subcutaneous TID WC   insulin glargine  12 Units Subcutaneous BID   lactulose  10 g Oral Q T,Th,S,Su   lactulose  20 g Oral Q M,W,F   lipase/protease/amylase  12,000 Units Oral TID WC   multivitamin  1 tablet Oral QHS   PHENobarbital  226.8 mg Oral QHS   simvastatin  20 mg Oral QHS   tamsulosin  0.4 mg Oral QHS   Continuous Infusions:  meropenem (MERREM) IV 500 mg (06/19/18 1805)     LOS: 1 day   Time spent=40 mins    Jabrea Kallstrom Arsenio Loader, MD [facility]  If 7PM-7AM, please contact night-coverage [email or web address] 06/20/2018, 8:17 AM

## 2018-06-21 LAB — RENAL FUNCTION PANEL
Albumin: 1.8 g/dL — ABNORMAL LOW (ref 3.5–5.0)
Anion gap: 9 (ref 5–15)
BUN: 41 mg/dL — ABNORMAL HIGH (ref 6–20)
CO2: 27 mmol/L (ref 22–32)
Calcium: 7.7 mg/dL — ABNORMAL LOW (ref 8.9–10.3)
Chloride: 98 mmol/L (ref 98–111)
Creatinine, Ser: 5.91 mg/dL — ABNORMAL HIGH (ref 0.61–1.24)
GFR calc Af Amer: 12 mL/min — ABNORMAL LOW (ref >60)
GFR calc non Af Amer: 10 mL/min — ABNORMAL LOW (ref >60)
Glucose, Bld: 240 mg/dL — ABNORMAL HIGH (ref 70–99)
Phosphorus: 2.9 mg/dL (ref 2.5–4.6)
Potassium: 3.1 mmol/L — ABNORMAL LOW (ref 3.5–5.1)
Sodium: 134 mmol/L — ABNORMAL LOW (ref 135–145)

## 2018-06-21 LAB — CULTURE, BLOOD (ROUTINE X 2): Special Requests: ADEQUATE

## 2018-06-21 LAB — CBC
HCT: 24.7 % — ABNORMAL LOW (ref 39.0–52.0)
Hemoglobin: 7.8 g/dL — ABNORMAL LOW (ref 13.0–17.0)
MCH: 29.1 pg (ref 26.0–34.0)
MCHC: 31.6 g/dL (ref 30.0–36.0)
MCV: 92.2 fL (ref 80.0–100.0)
Platelets: UNDETERMINED 10*3/uL (ref 150–400)
RBC: 2.68 MIL/uL — ABNORMAL LOW (ref 4.22–5.81)
RDW: 16.2 % — ABNORMAL HIGH (ref 11.5–15.5)
WBC: 6.1 10*3/uL (ref 4.0–10.5)
nRBC: 0 % (ref 0.0–0.2)

## 2018-06-21 LAB — HEPATITIS B SURFACE ANTIGEN: Hepatitis B Surface Ag: NEGATIVE

## 2018-06-21 LAB — GLUCOSE, CAPILLARY
Glucose-Capillary: 220 mg/dL — ABNORMAL HIGH (ref 70–99)
Glucose-Capillary: 106 mg/dL — ABNORMAL HIGH (ref 70–99)
Glucose-Capillary: 378 mg/dL — ABNORMAL HIGH (ref 70–99)
Glucose-Capillary: 350 mg/dL — ABNORMAL HIGH (ref 70–99)

## 2018-06-21 MED ORDER — POTASSIUM CHLORIDE CRYS ER 20 MEQ PO TBCR
40.0000 meq | EXTENDED_RELEASE_TABLET | Freq: Once | ORAL | Status: AC
  Administered 2018-06-21: 40 meq via ORAL
  Filled 2018-06-21: qty 2

## 2018-06-21 MED ORDER — DARBEPOETIN ALFA 200 MCG/0.4ML IJ SOSY
PREFILLED_SYRINGE | INTRAMUSCULAR | Status: AC
  Administered 2018-06-21: 200 ug via INTRAVENOUS
  Filled 2018-06-21: qty 0.4

## 2018-06-21 MED ORDER — HEPARIN SODIUM (PORCINE) 1000 UNIT/ML IJ SOLN
INTRAMUSCULAR | Status: AC
  Administered 2018-06-21: 3800 [IU]
  Filled 2018-06-21: qty 4

## 2018-06-21 NOTE — Progress Notes (Signed)
Pharmacy Antibiotic Note  Eddie Davies is a 48 y.o. male admitted on 06/18/2018 with N/V, fever and left sided abdominal tenderness. Pharmacy consulted 06/18/18  for meropenem. Patient has PMH significant for recent history of ESBL (K. Pneumoniae from expectorated sputum on 04/22/18). AKI>> ESRD on HD MWF,  HD started on 05/24/18. Afebrile, Tmax 99.9, WBC 6.1 Klebsiella pneumoniae bacteremia; ESBL Kleb pneumo UTI.  Vancomycin discontinued 06/19/18.  Continues on Meropenem.   Plan: Meropenem 500 mg IV q24h Monitor CBC and clinical progression   Height: 6' 2"  (188 cm) Weight: 234 lb 12.6 oz (106.5 kg) IBW/kg (Calculated) : 82.2  Temp (24hrs), Avg:98.6 F (37 C), Min:98.2 F (36.8 C), Max:99.9 F (37.7 C)  Recent Labs  Lab 06/18/18 1310 06/18/18 1319 06/19/18 0504 06/20/18 0427 06/20/18 1640 06/21/18 0839  WBC  --  12.5* 8.5 6.3 6.2 6.1  CREATININE  --  6.95* 7.69* 4.34* 5.00* 5.91*  LATICACIDVEN 1.1  --   --   --   --   --     Estimated Creatinine Clearance: 20.1 mL/min (A) (by C-G formula based on SCr of 5.91 mg/dL (H)).    Allergies  Allergen Reactions  . Fish Oil Swelling and Other (See Comments)        Antimicrobials this admission: Meropenem 5/25 >>  Vancomycin 5/25 >>5/26  Dose adjustments this admission: N/A  Microbiology results: 5/25 BCx: 2/4 bottles (2/2 sets) positive for Klebsiella pneumoniae (recent history of ESBL Klebsiella in March of 2020 in respiratory culture) 5/25 COVID: negative  5/25 urine: >100,000 colonies/mL ESBL Klebsiella pneumoniae 5/27: BCx: ngtd   Thank you for allowing pharmacy to be a part of this patient's care.  Nicole Cella, RPh Clinical Pharmacist Pager: [phone] Please check AMION for all Pharmacist numbers by unit 06/21/2018 1:18 PM

## 2018-06-21 NOTE — Progress Notes (Signed)
Patient's blood pressure this morning was 99/54 and heart rate 74. Repeat manual was 118/58 with heart rate 73. Baltazar Najjar, NP text paged. Awaiting for orders.

## 2018-06-21 NOTE — Progress Notes (Signed)
PROGRESS NOTE    Eddie Davies  HEN:[id number] DOB: 06-29-1970 DOA: 06/18/2018 PCP: [facility], [facility]   Brief Narrative:  48 year old with history of ESRD on hemodialysis since 05/24/2018, bilateral nonobstructive nephrolithiasis, chronic pancreatitis, diabetes mellitus type 2, cirrhosis, thrombocytopenia, anemia, ESBL/Klebsiella UTI in the past presented with fever abdominal pain.  CT of the abdomen pelvis showed concerns for pyelonephritis/cystitis without hydronephrosis.  COVID-negative.  Bacteremia-Klebsiella.  Currently on meropenem.   Assessment & Plan:   Principal Problem:   Bacteremia due to Klebsiella pneumoniae Active Problems:   Seizures ([place])   Benign essential HTN   Diabetes mellitus type 2, uncontrolled, with complications (HCC)   Thrombocytopenia (HCC)   Fever   ESRD (end stage renal disease) on dialysis (HCC)   Anemia due to chronic kidney disease   Leukocytosis  Bacteremia due to Klebsiella pneumonia; recurrent Urosepsis with concerns for pyelonephritis/cystitis - Likely the source of this is urine.  Initial blood cultures from 5/25+.  Repeat surveillance cultures - pending.  - Sensitivities reviewed, continue IV meropenem. -Continue supportive care.  COVID-negative  Acute kidney injury on hemodialysis - Started on hemodialysis 4/30 on Monday Wednesday Friday schedule.  Nephrology team is following.  Dialysis sessions per their service.  Anemia of chronic disease - Venofer is on hold due to bacteremia.  Continue Aranesp  Uncontrolled insulin-dependent diabetes mellitus type 2 with renal complications -Increase NovoLog to 6 units 3 times daily with meals, increase Lantus to 12 units twice daily - Hemoglobin A1c once ago 8.3.  History of cirrhosis -On lactulose  Chronic thrombocytopenia -No obvious signs of bleeding.  Liver cirrhosis.  Continue to monitor this.  History of seizures -Continue home regimen of phenobarbital and Tegretol   Generalized deconditioning -PT eval-recommending SNF  Hx of Right sided Non obstruction renal stone- unsure of this the culprit for recurrent sepsis and UTI. Spoke with Dr Junious Silk from urology, he doesn't think it is the cause of it. He recommends cont current management and have him follow up outpatient.    DVT prophylaxis: Subcutaneous heparin Code Status: Full code Family Communication: None at bedside Disposition Plan: Cont IV Abx for sepsis and bacteremia.   Consultants:   Nephrology  Procedures:   None  Antimicrobials:   Urine culture- Klebsiella  Blood cultures- Klebsiella   Subjective: Still feels weak overall. But no shortness of breath.   Review of Systems Otherwise negative except as per HPI, including: General = no fevers, chills, dizziness, malaise, fatigue HEENT/EYES = negative for pain, redness, loss of vision, double vision, blurred vision, loss of hearing, sore throat, hoarseness, dysphagia Cardiovascular= negative for chest pain, palpitation, murmurs, lower extremity swelling Respiratory/lungs= negative for shortness of breath, cough, hemoptysis, wheezing, mucus production Gastrointestinal= negative for nausea, vomiting,, abdominal pain, melena, hematemesis Genitourinary= negative for Dysuria, Hematuria, Change in Urinary Frequency MSK = Negative for arthralgia, myalgias, Back Pain, Joint swelling  Neurology= Negative for headache, seizures, numbness, tingling  Psychiatry= Negative for anxiety, depression, suicidal and homocidal ideation Allergy/Immunology= Medication/Food allergy as listed  Skin= Negative for Rash, lesions, ulcers, itching  Objective: Vitals:   06/21/18 1100 06/21/18 1130 06/21/18 1204 06/21/18 1243  BP: 115/60 (!) 166/48 135/66 (!) 138/94  Pulse: 81 84 78 87  Resp:   (!) 24 20  Temp:   98.2 F (36.8 C) 98.2 F (36.8 C)  TempSrc:   Oral Oral  SpO2:   95% 100%  Weight:   106.5 kg   Height:        Intake/Output Summary  (  Last 24 hours) at 06/21/2018 1300 Last data filed at 06/21/2018 1204 Gross per 24 hour  Intake 370.03 ml  Output 2050 ml  Net -1679.97 ml   Filed Weights   06/21/18 0600 06/21/18 0825 06/21/18 1204  Weight: 110.1 kg 109.6 kg 106.5 kg    Examination: Constitutional: NAD, calm, comfortable Eyes: PERRL, lids and conjunctivae normal ENMT: Mucous membranes are moist. Posterior pharynx clear of any exudate or lesions.Normal dentition.  Neck: normal, supple, no masses, no thyromegaly Respiratory: clear to auscultation bilaterally, no wheezing, no crackles. Normal respiratory effort. No accessory muscle use.  Cardiovascular: Regular rate and rhythm, no murmurs / rubs / gallops. No extremity edema. 2+ pedal pulses. No carotid bruits.  Abdomen: no tenderness, no masses palpated. No hepatosplenomegaly. Bowel sounds positive.  Musculoskeletal: no clubbing / cyanosis. No joint deformity upper and lower extremities. Good ROM, no contractures. Normal muscle tone.  Skin: Bilateral lower extremity chronic skin changes-scaly appearing. Neurologic: CN 2-12 grossly intact. Sensation intact, DTR normal. Strength 5/5 in all 4.  Psychiatric: Normal judgment and insight. Alert and oriented x 3. Normal mood.    Data Reviewed:   CBC: Recent Labs  Lab 06/18/18 1319 06/19/18 0504 06/20/18 0427 06/20/18 1640 06/21/18 0839  WBC 12.5* 8.5 6.3 6.2 6.1  NEUTROABS 10.8*  --   --   --   --   HGB 9.1* 8.1* 7.6* 8.5* 7.8*  HCT 28.2* 25.0* 23.8* 26.4* 24.7*  MCV 93.7 91.9 92.2 93.0 92.2  PLT 143* 121* 110* 120* PLATELET CLUMPS NOTED ON SMEAR, UNABLE TO ESTIMATE   Basic Metabolic Panel: Recent Labs  Lab 06/18/18 1319 06/19/18 0504 06/20/18 0427 06/20/18 1640 06/21/18 0839  NA 135 133* 133* 134* 134*  K 3.5 3.4* 2.8* 3.0* 3.1*  CL 97* 98 96* 98 98  CO2 20* 21* 24 24 27   GLUCOSE 418* 375* 379* 240* 240*  BUN 52* 64* 25* 32* 41*  CREATININE 6.95* 7.69* 4.34* 5.00* 5.91*  CALCIUM 7.5* 7.3* 7.4* 7.6*  7.7*  PHOS  --   --   --  2.8 2.9   GFR: Estimated Creatinine Clearance: 20.1 mL/min (A) (by C-G formula based on SCr of 5.91 mg/dL (H)). Liver Function Tests: Recent Labs  Lab 06/18/18 1319 06/19/18 0504 06/20/18 1640 06/21/18 0839  AST 14* 11*  --   --   ALT 11 10  --   --   ALKPHOS 112 100  --   --   BILITOT 0.7 0.8  --   --   PROT 6.2* 5.5*  --   --   ALBUMIN 2.1* 1.9* 2.0* 1.8*   Recent Labs  Lab 06/18/18 1334  LIPASE 12   No results for input(s): AMMONIA in the last 168 hours. Coagulation Profile: Recent Labs  Lab 06/18/18 1319  INR 1.9*   Cardiac Enzymes: No results for input(s): CKTOTAL, CKMB, CKMBINDEX, TROPONINI in the last 168 hours. BNP (last 3 results) No results for input(s): PROBNP in the last 8760 hours. HbA1C: Recent Labs    06/19/18 0504  HGBA1C 7.2*   CBG: Recent Labs  Lab 06/20/18 1121 06/20/18 1653 06/20/18 2119 06/21/18 0656 06/21/18 1240  GLUCAP 335* 208* 220* 220* 106*   Lipid Profile: No results for input(s): CHOL, HDL, LDLCALC, TRIG, CHOLHDL, LDLDIRECT in the last 72 hours. Thyroid Function Tests: No results for input(s): TSH, T4TOTAL, FREET4, T3FREE, THYROIDAB in the last 72 hours. Anemia Panel: No results for input(s): VITAMINB12, FOLATE, FERRITIN, TIBC, IRON, RETICCTPCT in the last 72 hours.  Sepsis Labs: Recent Labs  Lab 06/18/18 1310  LATICACIDVEN 1.1    Recent Results (from the past 240 hour(s))  Culture, blood (Routine x 2)     Status: Abnormal   Collection Time: 06/18/18  1:10 PM  Result Value Ref Range Status   Specimen Description BLOOD LEFT ANTECUBITAL  Final   Special Requests   Final    BOTTLES DRAWN AEROBIC AND ANAEROBIC Blood Culture results may not be optimal due to an excessive volume of blood received in culture bottles   Culture  Setup Time   Final    GRAM NEGATIVE RODS IN BOTH AEROBIC AND ANAEROBIC BOTTLES CRITICAL VALUE NOTED.  VALUE IS CONSISTENT WITH PREVIOUSLY REPORTED AND CALLED VALUE.     Culture (A)  Final    KLEBSIELLA PNEUMONIAE SUSCEPTIBILITIES PERFORMED ON PREVIOUS CULTURE WITHIN THE LAST 5 DAYS. Performed at [facility], [place] [address] [place], [place] [address]    Report Status 06/21/2018 FINAL  Final  Culture, blood (Routine x 2)     Status: Abnormal   Collection Time: 06/18/18  1:35 PM  Result Value Ref Range Status   Specimen Description BLOOD LEFT HAND  Final   Special Requests   Final    BOTTLES DRAWN AEROBIC AND ANAEROBIC Blood Culture adequate volume   Culture  Setup Time   Final    GRAM NEGATIVE RODS IN BOTH AEROBIC AND ANAEROBIC BOTTLES CRITICAL RESULT CALLED TO, READ BACK BY AND VERIFIED WITH: SINCLAIRE PHARMD AT [id number] XN [id number] BY SJW Performed at [facility], [place] [address], [place], [place] [address]    Culture (A)  Final    KLEBSIELLA PNEUMONIAE Confirmed Extended Spectrum Beta-Lactamase Producer (ESBL).  In bloodstream infections from ESBL organisms, carbapenems are preferred over piperacillin/tazobactam. They are shown to have a lower risk of mortality.    Report Status 06/21/2018 FINAL  Final   Organism ID, Bacteria KLEBSIELLA PNEUMONIAE  Final      Susceptibility   Klebsiella pneumoniae - MIC*    AMPICILLIN >=32 RESISTANT Resistant     CEFAZOLIN RESISTANT Resistant     CEFEPIME RESISTANT Resistant     CEFTAZIDIME RESISTANT Resistant     CEFTRIAXONE RESISTANT Resistant     CIPROFLOXACIN 0.5 SENSITIVE Sensitive     GENTAMICIN 8 INTERMEDIATE Intermediate     IMIPENEM <=0.25 SENSITIVE Sensitive     TRIMETH/SULFA >=320 RESISTANT Resistant     AMPICILLIN/SULBACTAM 8 SENSITIVE Sensitive     PIP/TAZO <=4 SENSITIVE Sensitive     Extended ESBL POSITIVE Resistant     * KLEBSIELLA PNEUMONIAE  Blood Culture ID Panel (Reflexed)     Status: Abnormal   Collection Time: 06/18/18  1:35 PM  Result Value Ref Range Status   Enterococcus species NOT DETECTED NOT DETECTED Final   Listeria monocytogenes NOT DETECTED NOT DETECTED Final    Staphylococcus species NOT DETECTED NOT DETECTED Final   Staphylococcus aureus (BCID) NOT DETECTED NOT DETECTED Final   Streptococcus species NOT DETECTED NOT DETECTED Final   Streptococcus agalactiae NOT DETECTED NOT DETECTED Final   Streptococcus pneumoniae NOT DETECTED NOT DETECTED Final   Streptococcus pyogenes NOT DETECTED NOT DETECTED Final   Acinetobacter baumannii NOT DETECTED NOT DETECTED Final   Enterobacteriaceae species DETECTED (A) NOT DETECTED Final    Comment: Enterobacteriaceae represent a large family of gram-negative bacteria, not a single organism. CRITICAL RESULT CALLED TO, READ BACK BY AND VERIFIED WITH: SINCLAIRE PHARMD AT 0911 ON [id number] BY SJW    Enterobacter cloacae complex  NOT DETECTED NOT DETECTED Final   Escherichia coli NOT DETECTED NOT DETECTED Final   Klebsiella oxytoca NOT DETECTED NOT DETECTED Final   Klebsiella pneumoniae DETECTED (A) NOT DETECTED Final    Comment: CRITICAL RESULT CALLED TO, READ BACK BY AND VERIFIED WITH: SINCLAIRE PHARMD AT 0258 ON [id number] BY SJW    Proteus species NOT DETECTED NOT DETECTED Final   Serratia marcescens NOT DETECTED NOT DETECTED Final   Carbapenem resistance NOT DETECTED NOT DETECTED Final   Haemophilus influenzae NOT DETECTED NOT DETECTED Final   Neisseria meningitidis NOT DETECTED NOT DETECTED Final   Pseudomonas aeruginosa NOT DETECTED NOT DETECTED Final   Candida albicans NOT DETECTED NOT DETECTED Final   Candida glabrata NOT DETECTED NOT DETECTED Final   Candida krusei NOT DETECTED NOT DETECTED Final   Candida parapsilosis NOT DETECTED NOT DETECTED Final   Candida tropicalis NOT DETECTED NOT DETECTED Final    Comment: Performed at [facility], [place]. [address], [place], [place] [address]  SARS Coronavirus 2 (CEPHEID- Performed in [facility] hospital lab), Hosp Order     Status: None   Collection Time: 06/18/18  1:58 PM  Result Value Ref Range Status   SARS Coronavirus 2 NEGATIVE NEGATIVE Final    Comment:  (NOTE) If result is NEGATIVE SARS-CoV-2 target nucleic acids are NOT DETECTED. The SARS-CoV-2 RNA is generally detectable in upper and lower  respiratory specimens during the acute phase of infection. The lowest  concentration of SARS-CoV-2 viral copies this assay can detect is 250  copies / mL. A negative result does not preclude SARS-CoV-2 infection  and should not be used as the sole basis for treatment or other  patient management decisions.  A negative result may occur with  improper specimen collection / handling, submission of specimen other  than nasopharyngeal swab, presence of viral mutation(s) within the  areas targeted by this assay, and inadequate number of viral copies  (<250 copies / mL). A negative result must be combined with clinical  observations, patient history, and epidemiological information. If result is POSITIVE SARS-CoV-2 target nucleic acids are DETECTED. The SARS-CoV-2 RNA is generally detectable in upper and lower  respiratory specimens dur ing the acute phase of infection.  Positive  results are indicative of active infection with SARS-CoV-2.  Clinical  correlation with patient history and other diagnostic information is  necessary to determine patient infection status.  Positive results do  not rule out bacterial infection or co-infection with other viruses. If result is PRESUMPTIVE POSTIVE SARS-CoV-2 nucleic acids MAY BE PRESENT.   A presumptive positive result was obtained on the submitted specimen  and confirmed on repeat testing.  While 2019 novel coronavirus  (SARS-CoV-2) nucleic acids may be present in the submitted sample  additional confirmatory testing may be necessary for epidemiological  and / or clinical management purposes  to differentiate between  SARS-CoV-2 and other Sarbecovirus currently known to infect humans.  If clinically indicated additional testing with an alternate test  methodology [phone]) is advised. The SARS-CoV-2 RNA is  generally  detectable in upper and lower respiratory sp ecimens during the acute  phase of infection. The expected result is Negative. Fact Sheet for Patients:  StrictlyIdeas.no Fact Sheet for Healthcare Providers: BankingDealers.co.za This test is not yet approved or cleared by the [place] FDA and has been authorized for detection and/or diagnosis of SARS-CoV-2 by FDA under an Emergency Use Authorization (EUA).  This EUA will remain in effect (meaning this test can be used) for  the duration of the COVID-19 declaration under Section 564(b)(1) of the Act, 21 U.S.C. section 360bbb-3(b)(1), unless the authorization is terminated or revoked sooner. Performed at [facility], [place] [address], [place], [place] [address]   Culture, Urine     Status: Abnormal   Collection Time: 06/18/18 10:45 PM  Result Value Ref Range Status   Specimen Description URINE, CLEAN CATCH  Final   Special Requests   Final    URINE, CLEAN CATCH Performed at [facility], [place] [address] [place], [place] [address]    Culture (A)  Final    >=100,000 COLONIES/mL KLEBSIELLA PNEUMONIAE Confirmed Extended Spectrum Beta-Lactamase Producer (ESBL).  In bloodstream infections from ESBL organisms, carbapenems are preferred over piperacillin/tazobactam. They are shown to have a lower risk of mortality.    Report Status 06/20/2018 FINAL  Final   Organism ID, Bacteria KLEBSIELLA PNEUMONIAE (A)  Final      Susceptibility   Klebsiella pneumoniae - MIC*    AMPICILLIN >=32 RESISTANT Resistant     CEFAZOLIN >=64 RESISTANT Resistant     CEFTRIAXONE RESISTANT Resistant     CIPROFLOXACIN 0.5 SENSITIVE Sensitive     GENTAMICIN >=16 RESISTANT Resistant     IMIPENEM <=0.25 SENSITIVE Sensitive     NITROFURANTOIN 64 INTERMEDIATE Intermediate     TRIMETH/SULFA >=320 RESISTANT Resistant     AMPICILLIN/SULBACTAM 8 SENSITIVE Sensitive     PIP/TAZO <=4 SENSITIVE Sensitive      Extended ESBL POSITIVE Resistant     * >=100,000 COLONIES/mL KLEBSIELLA PNEUMONIAE         Radiology Studies: No results found.      Scheduled Meds: . calcium acetate  667 mg Oral TID WC  . carbamazepine  700 mg Oral BID  . Chlorhexidine Gluconate Cloth  6 each Topical Q0600  . cholecalciferol  2,000 Units Oral Daily  . darbepoetin (ARANESP) injection - DIALYSIS  200 mcg Intravenous Q Thu-HD  . feeding supplement (NEPRO CARB STEADY)  237 mL Oral BID BM  . feeding supplement (PRO-STAT SUGAR FREE 64)  30 mL Oral BID  . ferrous sulfate  325 mg Oral Q breakfast  . heparin  5,000 Units Subcutaneous Q8H  . insulin aspart  0-5 Units Subcutaneous QHS  . insulin aspart  0-9 Units Subcutaneous TID WC  . insulin aspart  6 Units Subcutaneous TID WC  . insulin glargine  12 Units Subcutaneous BID  . lactulose  10 g Oral Q T,Th,S,Su  . lactulose  20 g Oral Q M,W,F  . lipase/protease/amylase  12,000 Units Oral TID WC  . multivitamin  1 tablet Oral QHS  . PHENobarbital  226.8 mg Oral QHS  . potassium chloride  40 mEq Oral Once  . simvastatin  20 mg Oral QHS  . tamsulosin  0.4 mg Oral QHS   Continuous Infusions: . meropenem (MERREM) IV 500 mg (06/20/18 1430)     LOS: 2 days   Time spent=40 mins     Arsenio Loader, MD [facility]  If 7PM-7AM, please contact night-coverage [email or web address] 06/21/2018, 1:00 PM

## 2018-06-21 NOTE — Progress Notes (Addendum)
[facility] Progress Note   Subjective:  Seen on HD - 2L UF goal, tolerating without issues. BP lower early this AM, better now. Denies dyspnea.  Objective Vitals:   06/21/18 0825 06/21/18 0833 06/21/18 0838 06/21/18 0900  BP: 137/75 (!) 148/91 136/78 (!) 148/94  Pulse: 78 74 74 74  Resp: 18 19 (!) 23 (!) 22  Temp: 98.3 F (36.8 C)     TempSrc: Oral     SpO2: 94%     Weight: 109.6 kg     Height:       Physical Exam General:Overweight man, NAD Heart:RRR; no murmur Lungs:CTA anteriorly Abdomen:soft, non-tender Extremities:No LE edema; scaly feet/legs - likely icthyosis Dialysis Access: [facility]  Additional Objective Labs: Basic Metabolic Panel: Recent Labs  Lab 06/19/18 0504 06/20/18 0427 06/20/18 1640  NA 133* 133* 134*  K 3.4* 2.8* 3.0*  CL 98 96* 98  CO2 21* 24 24  GLUCOSE 375* 379* 240*  BUN 64* 25* 32*  CREATININE 7.69* 4.34* 5.00*  CALCIUM 7.3* 7.4* 7.6*  PHOS  --   --  2.8   Liver Function Tests: Recent Labs  Lab 06/18/18 1319 06/19/18 0504 06/20/18 1640  AST 14* 11*  --   ALT 11 10  --   ALKPHOS 112 100  --   BILITOT 0.7 0.8  --   PROT 6.2* 5.5*  --   ALBUMIN 2.1* 1.9* 2.0*   Recent Labs  Lab 06/18/18 1334  LIPASE 12   CBC: Recent Labs  Lab 06/18/18 1319 06/19/18 0504 06/20/18 0427 06/20/18 1640  WBC 12.5* 8.5 6.3 6.2  NEUTROABS 10.8*  --   --   --   HGB 9.1* 8.1* 7.6* 8.5*  HCT 28.2* 25.0* 23.8* 26.4*  MCV 93.7 91.9 92.2 93.0  PLT 143* 121* 110* 120*   Blood Culture    Component Value Date/Time   SDES URINE, CLEAN CATCH 06/18/2018 2245   SPECREQUEST  06/18/2018 2245    URINE, CLEAN CATCH Performed at [facility], [place] [address], [place], [place] [address]    CULT (A) 06/18/2018 2245    >=100,000 COLONIES/mL KLEBSIELLA PNEUMONIAE Confirmed Extended Spectrum Beta-Lactamase Producer (ESBL).  In bloodstream infections from ESBL organisms, carbapenems are preferred over piperacillin/tazobactam. They are  shown to have a lower risk of mortality.    REPTSTATUS 06/20/2018 FINAL 06/18/2018 2245   CBG: Recent Labs  Lab 06/20/18 0732 06/20/18 1121 06/20/18 1653 06/20/18 2119 06/21/18 0656  GLUCAP 372* 335* 208* 220* 220*   Medications: . meropenem (MERREM) IV 500 mg (06/20/18 1430)   . calcium acetate  667 mg Oral TID WC  . carbamazepine  700 mg Oral BID  . Chlorhexidine Gluconate Cloth  6 each Topical Q0600  . cholecalciferol  2,000 Units Oral Daily  . darbepoetin (ARANESP) injection - DIALYSIS  200 mcg Intravenous Q Thu-HD  . feeding supplement (NEPRO CARB STEADY)  237 mL Oral BID BM  . feeding supplement (PRO-STAT SUGAR FREE 64)  30 mL Oral BID  . ferrous sulfate  325 mg Oral Q breakfast  . heparin  5,000 Units Subcutaneous Q8H  . insulin aspart  0-5 Units Subcutaneous QHS  . insulin aspart  0-9 Units Subcutaneous TID WC  . insulin aspart  6 Units Subcutaneous TID WC  . insulin glargine  12 Units Subcutaneous BID  . lactulose  10 g Oral Q T,Th,S,Su  . lactulose  20 g Oral Q M,W,F  . lipase/protease/amylase  12,000 Units Oral TID WC  .  multivitamin  1 tablet Oral QHS  . PHENobarbital  226.8 mg Oral QHS  . potassium chloride  40 mEq Oral Once  . simvastatin  20 mg Oral QHS  . tamsulosin  0.4 mg Oral QHS    Dialysis Orders: AKI/ [facility] MWF 3.5h 400/800 114.5kg leaving below 3K/2.5Ca bath RIJ TDC Hep 3000 Venofer through 6/5  Mircera 100 last 5/15  no VDRA  Labs: hgb 9 18% sat Ca/P slight ^ -recently started on Ca acetate 1 ac iPTH ok Glu high 300 - 500s Cr 5.37 5/13 Cr 4.6 5/18  Assessment/Plan: 1. Pyelonephritis/Klebsiella bacteremia: BCx and UCx 5/25 growing Klebsiella. Repeat BCx 5/27 pending. COVID negative. On meropenem. 2. AKI: On HD since 4/30 on MWF schedule, has been off schedule here. S/p HD 5/26 - HD again today. KCl 103mq given 5/27, using higher 4K bath with dialysis. Givenacute status - continue to avoid NSAIDs, contrast where possible,  ACE/ARBs and baclofen 3. Hypertension/volume: BP slightly high, weights variable - trying to avoid hypotension. 4. Anemia: Hgb 8.5, holding venofer d/t bacteremia - on PO iron. Resuming Aranesp 200 q week. 5. Metabolic bone disease: Corr Ca/Phos ok.No binders or VDRA needed 6. Nutrition: Alb very low. Hxcirrhosis plusmultiple hospitalizations. Continue Nepro/Pro-stat. 7. DM: LastA1c 8.3 in March - sugars seem to be running high in outpatient setting.?Poor control vs infection 8. Cirrhosis:On lactulose. 9. H/o cognitive delay  10. H/o debility:SNF dependent x about 1 year  [name] PHershal Coria5/28/2020, 9:15 AM  [facility] Pager: [phone] Pt seen, examined and agree w A/P as above.  RKelly Splinter MD 06/21/2018, 10:25 AM

## 2018-06-22 LAB — CBC
HCT: 24.8 % — ABNORMAL LOW (ref 39.0–52.0)
Hemoglobin: 8 g/dL — ABNORMAL LOW (ref 13.0–17.0)
MCH: 30 pg (ref 26.0–34.0)
MCHC: 32.3 g/dL (ref 30.0–36.0)
MCV: 92.9 fL (ref 80.0–100.0)
Platelets: 146 K/uL — ABNORMAL LOW (ref 150–400)
RBC: 2.67 MIL/uL — ABNORMAL LOW (ref 4.22–5.81)
RDW: 16.2 % — ABNORMAL HIGH (ref 11.5–15.5)
WBC: 6.8 K/uL (ref 4.0–10.5)
nRBC: 0 % (ref 0.0–0.2)

## 2018-06-22 LAB — BASIC METABOLIC PANEL
Anion gap: 12 (ref 5–15)
BUN: 27 mg/dL — ABNORMAL HIGH (ref 6–20)
CO2: 24 mmol/L (ref 22–32)
Calcium: 7.8 mg/dL — ABNORMAL LOW (ref 8.9–10.3)
Chloride: 99 mmol/L (ref 98–111)
Creatinine, Ser: 4.24 mg/dL — ABNORMAL HIGH (ref 0.61–1.24)
GFR calc Af Amer: 18 mL/min — ABNORMAL LOW (ref >60)
GFR calc non Af Amer: 16 mL/min — ABNORMAL LOW (ref >60)
Glucose, Bld: 245 mg/dL — ABNORMAL HIGH (ref 70–99)
Potassium: 3.6 mmol/L (ref 3.5–5.1)
Sodium: 135 mmol/L (ref 135–145)

## 2018-06-22 LAB — GLUCOSE, CAPILLARY
Glucose-Capillary: 214 mg/dL — ABNORMAL HIGH (ref 70–99)
Glucose-Capillary: 239 mg/dL — ABNORMAL HIGH (ref 70–99)
Glucose-Capillary: 228 mg/dL — ABNORMAL HIGH (ref 70–99)
Glucose-Capillary: 245 mg/dL — ABNORMAL HIGH (ref 70–99)

## 2018-06-22 LAB — MAGNESIUM: Magnesium: 1.9 mg/dL (ref 1.7–2.4)

## 2018-06-22 MED ORDER — CHLORHEXIDINE GLUCONATE CLOTH 2 % EX PADS: 6.0000 | MEDICATED_PAD | Freq: Every day | CUTANEOUS | Status: DC

## 2018-06-22 NOTE — Progress Notes (Signed)
Inpatient Diabetes Program Recommendations  AACE/ADA: New Consensus Statement on Inpatient Glycemic Control (2015)  Target Ranges:  Prepandial:   less than 140 mg/dL      Peak postprandial:   less than 180 mg/dL (1-2 hours)      Critically ill patients:  140 - 180 mg/dL   Lab Results  Component Value Date   GLUCAP 239 (H) 06/22/2018   HGBA1C 7.2 (H) 06/19/2018    Review of Glycemic Control Results for SIMCHA, SPEIR (MRN [id number]) as of 06/22/2018 12:18  Ref. Range 06/21/2018 16:51 06/21/2018 21:25 06/22/2018 07:03 06/22/2018 11:23  Glucose-Capillary Latest Ref Range: 70 - 99 mg/dL 378 (H) 350 (H) 214 (H) 239 (H)   Diabetes history: Type 2 DM Outpatient Diabetes medications: Novolog 0-15 units TID, Basaglar 13 units QHS Current orders for Inpatient glycemic control: Novolog 0-9 units TID, Novolog 0-5 units QHS, Novolog 6 units TID, Lantus 12 units BID  Inpatient Diabetes Program Recommendations:    Of note, patient received Novolog 6 units at 0809 on 5/28, does not appear to have consumed meal, put patient at risk for hypoglycemia. Additionally, patient returned form HD and ate lunch, however, did not received appropriate meal coverage, thus explaining increase in glucose to 378 mg/dL.   Given current trends consider:  - Increase Lantus to 14 units BID.   Thanks, Bronson Curb, MSN, RNC-OB Diabetes Coordinator [phone] (8a-5p)

## 2018-06-22 NOTE — NC FL2 (Signed)
[place] LEVEL OF CARE SCREENING TOOL     IDENTIFICATION  Patient Name: Eddie Davies Birthdate: 27-Apr-1970 Sex: male Admission Date (Current Location): 06/18/2018  [facility] and [place] Number:  Herbalist and Address:  [facility] [place] [place] [address], [place], [place] [address]      Provider Number: [id number]  Attending Physician Name and Address:  Damita Lack, MD  Relative Name and Phone Number:  Roselle Locus [phone]    Current Level of Care: Hospital Recommended Level of Care: [place] Prior Approval Number:    Date Approved/Denied: 04/18/16 PASRR Number: [id number]  Discharge Plan: SNF    Current Diagnoses: Patient Active Problem List   Diagnosis Date Noted  . Bacteremia due to Klebsiella pneumoniae 06/19/2018  . Fever 06/18/2018  . ESRD (end stage renal disease) on dialysis ([place]) 06/18/2018  . Anemia due to chronic kidney disease 06/18/2018  . Leukocytosis 06/18/2018  . Acute metabolic encephalopathy 71/24/5809  . Seizures ([place]) 05/24/2018  . BPH (benign prostatic hyperplasia) 05/24/2018  . Sepsis ([place]) 05/24/2018  . Benign essential HTN 05/24/2018  . HLD (hyperlipidemia) 05/24/2018  . Diabetes mellitus type 2, uncontrolled, with complications ([place]) 98/33/8250  . Thrombocytopenia ([place]) 05/24/2018  . Confusion   . Metabolic acidosis 53/97/6734  . Respiratory failure ([place])   . Acute renal failure superimposed on stage 4 chronic kidney disease ([place])   . End stage liver disease ([place])   . Pressure injury of skin 04/21/2018  . Altered mental status, unspecified 04/21/2018  . HCAP (healthcare-associated pneumonia) 04/21/2018  . Acute renal failure (ARF) ([place]) 04/07/2018    Orientation RESPIRATION BLADDER Height & Weight     Self, Time, Situation, Place    Continent Weight: 234 lb 12.6 oz (106.5 kg) Height:  6' 2"  (188 cm)  BEHAVIORAL SYMPTOMS/MOOD NEUROLOGICAL BOWEL  NUTRITION STATUS      Incontinent Diet(Renal diet, No snacks, fluid restriciton 1236m, thin liquids)  AMBULATORY STATUS COMMUNICATION OF NEEDS Skin   Limited Assist Verbally Bruising, Other (Comment)(dry flaky skin (Arm/leg), brusing on abdomen, eczema (hand/back/face), MASD on scrotum, pressure injury on coccyx)                       Personal Care Assistance Level of Assistance  Bathing, Feeding, Dressing, Total care Bathing Assistance: Limited assistance Feeding assistance: Independent Dressing Assistance: Limited assistance Total Care Assistance: Limited assistance   Functional Limitations Info  Sight, Speech Sight Info: Adequate Hearing Info: Adequate Speech Info: Adequate    SPECIAL CARE FACTORS FREQUENCY  PT (By licensed PT), OT (By licensed OT)     PT Frequency: 5x/wk OT Frequency: 5x/wk            Contractures Contractures Info: Not present    Additional Factors Info  Code Status, Allergies, Insulin Sliding Scale Code Status Info: Full Code Allergies Info:  Fish Oil   Insulin Sliding Scale Info: insulin aspart novolog 0-5 units at bedtime; insulin aspart novolog 0-9 units 3x daily w/meals; insulin glargine (lantus) 12 units 2x daily w/meals       Current Medications (06/22/2018):  This is the current hospital active medication list Current Facility-Administered Medications  Medication Dose Route Frequency Provider Last Rate Last Dose  . acetaminophen (TYLENOL) tablet 650 mg  650 mg Oral Q6H PRN AAline August MD       Or  . acetaminophen (TYLENOL) suppository 650 mg  650 mg Rectal Q6H PRN AAline August MD      .  calcium acetate (PHOSLO) capsule 667 mg  667 mg Oral TID WC Aline August, MD   667 mg at 06/22/18 1228  . carbamazepine (TEGRETOL) chewable tablet 700 mg  700 mg Oral BID Aline August, MD   700 mg at 06/22/18 0818  . Chlorhexidine Gluconate Cloth 2 % PADS 6 each  6 each Topical Q0600 Alric Seton, [name]   6 each at 06/21/18 0800  .  cholecalciferol (VITAMIN D3) tablet 2,000 Units  2,000 Units Oral Daily Aline August, MD   2,000 Units at 06/22/18 [phone]  . Darbepoetin Alfa (ARANESP) injection 200 mcg  200 mcg Intravenous Q Thu-HD Loren Racer, [name]   200 mcg at 06/21/18 1108  . feeding supplement (NEPRO CARB STEADY) liquid 237 mL  237 mL Oral BID BM Alekh, Kshitiz, MD   237 mL at 06/22/18 1403  . feeding supplement (PRO-STAT SUGAR FREE 64) liquid 30 mL  30 mL Oral BID Aline August, MD   30 mL at 06/22/18 0820  . ferrous sulfate tablet 325 mg  325 mg Oral Q breakfast Aline August, MD   325 mg at 06/22/18 0818  . guaiFENesin (MUCINEX) 12 hr tablet 600 mg  600 mg Oral Q12H PRN Aline August, MD      . heparin injection 5,000 Units  5,000 Units Subcutaneous Q8H Aline August, MD   5,000 Units at 06/22/18 1402  . HYDROcodone-acetaminophen (NORCO/VICODIN) 5-325 MG per tablet 1 tablet  1 tablet Oral Q4H PRN Aline August, MD   1 tablet at 06/20/18 2229  . insulin aspart (novoLOG) injection 0-5 Units  0-5 Units Subcutaneous QHS Aline August, MD   4 Units at 06/21/18 2220  . insulin aspart (novoLOG) injection 0-9 Units  0-9 Units Subcutaneous TID WC Aline August, MD   3 Units at 06/22/18 1229  . insulin aspart (novoLOG) injection 6 Units  6 Units Subcutaneous TID WC Vann, Jessica U, DO   6 Units at 06/22/18 1229  . insulin glargine (LANTUS) injection 12 Units  12 Units Subcutaneous BID Eulogio Bear U, DO   12 Units at 06/22/18 1026  . iohexol (OMNIPAQUE) 300 MG/ML solution 100 mL  100 mL Intravenous Once PRN Quintella Reichert, MD      . lactulose (CHRONULAC) 10 GM/15ML solution 10 g  10 g Oral Q T,Th,S,Su Alekh, Kshitiz, MD   10 g at 06/21/18 1331  . lactulose (CHRONULAC) 10 GM/15ML solution 20 g  20 g Oral Q M,W,F Starla Link, Kshitiz, MD   20 g at 06/20/18 0853  . lipase/protease/amylase (CREON) capsule 12,000 Units  12,000 Units Oral TID WC Aline August, MD   12,000 Units at 06/22/18 1228  . meropenem (MERREM) 500 mg in  sodium chloride 0.9 % 100 mL IVPB  500 mg Intravenous Q24H Quintella Reichert, MD 200 mL/hr at 06/22/18 1402 500 mg at 06/22/18 1402  . morphine 2 MG/ML injection 1-2 mg  1-2 mg Intravenous Q3H PRN Aline August, MD   2 mg at 06/21/18 2225  . multivitamin (RENA-VIT) tablet 1 tablet  1 tablet Oral QHS Aline August, MD   1 tablet at 06/21/18 2218  . ondansetron (ZOFRAN) tablet 4 mg  4 mg Oral Q6H PRN Aline August, MD       Or  . ondansetron (ZOFRAN) injection 4 mg  4 mg Intravenous Q6H PRN Alekh, Kshitiz, MD      . PHENobarbital (LUMINAL) tablet 226.8 mg  226.8 mg Oral QHS Alekh, Kshitiz, MD   226.8 mg at 06/21/18 2236  .  senna-docusate (Senokot-S) tablet 1 tablet  1 tablet Oral QHS PRN Aline August, MD      . simvastatin (ZOCOR) tablet 20 mg  20 mg Oral QHS Aline August, MD   20 mg at 06/21/18 2235  . tamsulosin (FLOMAX) capsule 0.4 mg  0.4 mg Oral QHS Aline August, MD   0.4 mg at 06/21/18 2235     Discharge Medications: Please see discharge summary for a list of discharge medications.  Relevant Imaging Results:  Relevant Lab Results:   Additional Information SSN: [id number]  [place], LCSWA

## 2018-06-22 NOTE — TOC Initial Note (Signed)
Transition of Care [facility]) - Initial/Assessment Note    Patient Details  Name: Eddie Davies MRN: [id number] Date of Birth: [birth year]  Transition of [facility]) CM/SW Contact:    Gelene Mink, [place] Phone Number: 06/22/2018, 2:32 PM  Clinical Narrative:        CSW met with the patient at bedside. The patient was alert and oriented. CSW inquired about the patient being in the hospital. He stated that he had a temperature and wasn't able to complete dialysis on Monday. CSW asked if the patient would like to go back to [facility] in [place]. He agreed to return back. CSW asked if he was having any issues with his dialysis, he stated that he wasn't. Patient is agreeable to going back to [facility] once he is medically ready for discharge.   CSW will continue to assist with discharge planning.             Expected Discharge Plan: [place] Barriers to Discharge: Continued Medical Work up   Patient Goals and CMS Choice Patient states their goals for this hospitalization and ongoing recovery are:: Pt will go back to [facility] in [place] once medically stable  CMS Medicare.gov Compare Post Acute Care list provided to:: Other (Comment Required)(Returning back to the facility that he arrived from) Choice offered to / list presented to : Patient  Expected Discharge Plan and Services Expected Discharge Plan: [place] In-house Referral: NA Discharge Planning Services: NA Post Acute Care Choice: [place] Living arrangements for the past 2 months: [place]                 DME Arranged: N/A DME Agency: NA       HH Arranged: NA HH Agency: NA        Prior Living Arrangements/Services Living arrangements for the past 2 months: [place] Lives with:: Facility Resident Patient language and need for interpreter reviewed:: No Do you feel safe going back to the place where you live?: Yes      Need  for Family Participation in Patient Care: No (Comment) Care giver support system in place?: Yes (comment)   Criminal Activity/Legal Involvement Pertinent to Current Situation/Hospitalization: No - Comment as needed  Activities of Daily Living      Permission Sought/Granted Permission sought to share information with : Case Manager Permission granted to share information with : Yes, Verbal Permission Granted     Permission granted to share info w AGENCY: [facility]        Emotional Assessment   Attitude/Demeanor/Rapport: Engaged Affect (typically observed): Accepting Orientation: : Oriented to Self, Oriented to Place, Oriented to  Time, Oriented to Situation Alcohol / Substance Use: Not Applicable Psych Involvement: No (comment)  Admission diagnosis:  poss covid Patient Active Problem List   Diagnosis Date Noted  . Bacteremia due to Klebsiella pneumoniae 06/19/2018  . Fever 06/18/2018  . ESRD (end stage renal disease) on dialysis ([place]) 06/18/2018  . Anemia due to chronic kidney disease 06/18/2018  . Leukocytosis 06/18/2018  . Acute metabolic encephalopathy 94/85/4627  . Seizures ([place]) 05/24/2018  . BPH (benign prostatic hyperplasia) 05/24/2018  . Sepsis ([place]) 05/24/2018  . Benign essential HTN 05/24/2018  . HLD (hyperlipidemia) 05/24/2018  . Diabetes mellitus type 2, uncontrolled, with complications ([place]) 03/50/0938  . Thrombocytopenia ([place]) 05/24/2018  . Confusion   . Metabolic acidosis 18/29/9371  . Respiratory failure ([place])   . Acute renal failure superimposed on stage 4  chronic kidney disease ([place])   . End stage liver disease ([place])   . Pressure injury of skin 04/21/2018  . Altered mental status, unspecified 04/21/2018  . HCAP (healthcare-associated pneumonia) 04/21/2018  . Acute renal failure (ARF) ([place]) 04/07/2018   PCP:  Practice, [place]:  No Pharmacies Listed    Social Determinants of Health (SDOH) Interventions     Readmission Risk Interventions Readmission Risk Prevention Plan 05/28/2018 05/23/2018 04/27/2018  Transportation Screening - Complete Complete  PCP or Specialist Appt within 5-7 Days - - Complete  Home Care Screening - - Complete  Medication Review (RN CM) - - Complete  Medication Review ([place]) - Complete -  [place] or Home Care Consult Complete (No Data) -  SW Recovery Care/Counseling Consult Complete - -  Palliative Care Screening - Not Applicable -  [place] - Complete -  Some recent data might be hidden

## 2018-06-22 NOTE — Progress Notes (Signed)
PROGRESS NOTE    Eddie Davies  URK:[id number] DOB: 1970/03/10 DOA: 06/18/2018 PCP: Practice, [facility]   Brief Narrative:  48 year old with history of ESRD on hemodialysis since 05/24/2018, bilateral nonobstructive nephrolithiasis, chronic pancreatitis, diabetes mellitus type 2, cirrhosis, thrombocytopenia, anemia, ESBL/Klebsiella UTI in the past presented with fever abdominal pain.  CT of the abdomen pelvis showed concerns for pyelonephritis/cystitis without hydronephrosis.  COVID-negative.  Bacteremia-Klebsiella.  Currently on meropenem.   Assessment & Plan:   Principal Problem:   Bacteremia due to Klebsiella pneumoniae Active Problems:   Seizures ([place])   Benign essential HTN   Diabetes mellitus type 2, uncontrolled, with complications (HCC)   Thrombocytopenia (HCC)   Fever   ESRD (end stage renal disease) on dialysis (HCC)   Anemia due to chronic kidney disease   Leukocytosis  Bacteremia due to Klebsiella pneumonia; recurrent Urosepsis with concerns for pyelonephritis/cystitis - Likely the source of this is urine.  Initial blood cultures from 5/25+.  Repeat surveillance cultures -negative to date - Spoke with Dr. Johnnye Sima from infectious disease, recommends 7 days of IV antibiotics followed by 7 days of oral Cipro unless if his clinical status changes.  For now okay to leave tunneled HD catheter in as this with bacteremia and sepsis was even present prior to having tunneled catheter placed.  Tunneled catheter site looks clean and without any obvious evidence of infection at the moment. -Continue supportive care.  COVID-negative  Acute kidney injury on hemodialysis - Started on hemodialysis 4/30 on Monday Wednesday Friday schedule.  Nephrology team is following.  Dialysis sessions per their service.  Anemia of chronic disease - Venofer is on hold due to bacteremia.  Continue Aranesp  Uncontrolled insulin-dependent diabetes mellitus type 2 with renal complications  -Increase NovoLog to 6 units 3 times daily with meals, increase Lantus to 12 units twice daily - Hemoglobin A1c once ago 8.3.  History of cirrhosis -On lactulose  Chronic thrombocytopenia -No obvious signs of bleeding.  Liver cirrhosis.  Continue to monitor this.  History of seizures -Continue home regimen of phenobarbital and Tegretol  Generalized deconditioning -PT eval-recommending SNF  Hx of Right sided Non obstruction renal stone- unsure of this the culprit for recurrent sepsis and UTI. Spoke with Dr Junious Silk from urology, he doesn't think it is the cause of it. He recommends cont current management and have him follow up outpatient.    DVT prophylaxis: Subcutaneous heparin Code Status: Full code Family Communication: None at bedside Disposition Plan: Maintain hospital stay to complete 7-day course of IV antibiotics thereafter we will switch him over to oral and discharge him.  Consultants:   Nephrology  Procedures:   None  Antimicrobials:   Urine culture- Klebsiella  Blood cultures- Klebsiella   Subjective: Patient states he overall still feels slightly weak but denies any shortness of breath or any other complaints.  He is tolerating his dialysis okay.  Review of Systems Otherwise negative except as per HPI, including: General = no fevers, chills, dizziness, malaise, fatigue HEENT/EYES = negative for pain, redness, loss of vision, double vision, blurred vision, loss of hearing, sore throat, hoarseness, dysphagia Cardiovascular= negative for chest pain, palpitation, murmurs, lower extremity swelling Respiratory/lungs= negative for shortness of breath, cough, hemoptysis, wheezing, mucus production Gastrointestinal= negative for nausea, vomiting,, abdominal pain, melena, hematemesis Genitourinary= negative for Dysuria, Hematuria, Change in Urinary Frequency MSK = Negative for arthralgia, myalgias, Back Pain, Joint swelling  Neurology= Negative for headache,  seizures, numbness, tingling  Psychiatry= Negative for anxiety, depression, suicidal and  homocidal ideation Allergy/Immunology= Medication/Food allergy as listed  Skin= Negative for Rash, lesions, ulcers, itching   Objective: Vitals:   06/21/18 2125 06/22/18 0500 06/22/18 0518 06/22/18 0912  BP: (!) 152/91  99/60 114/70  Pulse: 95  84 80  Resp: 18  18 18   Temp: 98.9 F (37.2 C)  98.4 F (36.9 C) 98.3 F (36.8 C)  TempSrc: Oral  Oral Oral  SpO2: 97%  96% 98%  Weight: 106.5 kg 106.5 kg    Height:        Intake/Output Summary (Last 24 hours) at 06/22/2018 1314 Last data filed at 06/22/2018 0900 Gross per 24 hour  Intake 120 ml  Output 50 ml  Net 70 ml   Filed Weights   06/21/18 1204 06/21/18 2125 06/22/18 0500  Weight: 106.5 kg 106.5 kg 106.5 kg    Examination: Constitutional: NAD, calm, comfortable Eyes: PERRL, lids and conjunctivae normal ENMT: Mucous membranes are moist. Posterior pharynx clear of any exudate or lesions.Normal dentition.  Neck: normal, supple, no masses, no thyromegaly. Respiratory: clear to auscultation bilaterally, no wheezing, no crackles. Normal respiratory effort. No accessory muscle use.  Cardiovascular: Regular rate and rhythm, no murmurs / rubs / gallops. No extremity edema. 2+ pedal pulses. No carotid bruits..  Right-sided chest tunneled catheter in place without any evidence of obvious infection or drainage. Abdomen: no tenderness, no masses palpated. No hepatosplenomegaly. Bowel sounds positive.  Musculoskeletal: no clubbing / cyanosis. No joint deformity upper and lower extremities. Good ROM, no contractures. Normal muscle tone.  Skin: Bilateral scaly skin of his lower extremity and some bilateral upper extremity which is chronic Neurologic: CN 2-12 grossly intact. Sensation intact, DTR normal. Strength 4/5 in all 4.  Psychiatric: Normal judgment and insight. Alert and oriented x 3. Normal mood.   Data Reviewed:   CBC: Recent Labs  Lab  06/18/18 1319 06/19/18 0504 06/20/18 0427 06/20/18 1640 06/21/18 0839 06/22/18 0628  WBC 12.5* 8.5 6.3 6.2 6.1 6.8  NEUTROABS 10.8*  --   --   --   --   --   HGB 9.1* 8.1* 7.6* 8.5* 7.8* 8.0*  HCT 28.2* 25.0* 23.8* 26.4* 24.7* 24.8*  MCV 93.7 91.9 92.2 93.0 92.2 92.9  PLT 143* 121* 110* 120* PLATELET CLUMPS NOTED ON SMEAR, UNABLE TO ESTIMATE 481*   Basic Metabolic Panel: Recent Labs  Lab 06/19/18 0504 06/20/18 0427 06/20/18 1640 06/21/18 0839 06/22/18 0628  NA 133* 133* 134* 134* 135  K 3.4* 2.8* 3.0* 3.1* 3.6  CL 98 96* 98 98 99  CO2 21* 24 24 27 24   GLUCOSE 375* 379* 240* 240* 245*  BUN 64* 25* 32* 41* 27*  CREATININE 7.69* 4.34* 5.00* 5.91* 4.24*  CALCIUM 7.3* 7.4* 7.6* 7.7* 7.8*  MG  --   --   --   --  1.9  PHOS  --   --  2.8 2.9  --    GFR: Estimated Creatinine Clearance: 28 mL/min (A) (by C-G formula based on SCr of 4.24 mg/dL (H)). Liver Function Tests: Recent Labs  Lab 06/18/18 1319 06/19/18 0504 06/20/18 1640 06/21/18 0839  AST 14* 11*  --   --   ALT 11 10  --   --   ALKPHOS 112 100  --   --   BILITOT 0.7 0.8  --   --   PROT 6.2* 5.5*  --   --   ALBUMIN 2.1* 1.9* 2.0* 1.8*   Recent Labs  Lab 06/18/18 1334  LIPASE 12  No results for input(s): AMMONIA in the last 168 hours. Coagulation Profile: Recent Labs  Lab 06/18/18 1319  INR 1.9*   Cardiac Enzymes: No results for input(s): CKTOTAL, CKMB, CKMBINDEX, TROPONINI in the last 168 hours. BNP (last 3 results) No results for input(s): PROBNP in the last 8760 hours. HbA1C: No results for input(s): HGBA1C in the last 72 hours. CBG: Recent Labs  Lab 06/21/18 1240 06/21/18 1651 06/21/18 2125 06/22/18 0703 06/22/18 1123  GLUCAP 106* 378* 350* 214* 239*   Lipid Profile: No results for input(s): CHOL, HDL, LDLCALC, TRIG, CHOLHDL, LDLDIRECT in the last 72 hours. Thyroid Function Tests: No results for input(s): TSH, T4TOTAL, FREET4, T3FREE, THYROIDAB in the last 72 hours. Anemia Panel: No  results for input(s): VITAMINB12, FOLATE, FERRITIN, TIBC, IRON, RETICCTPCT in the last 72 hours. Sepsis Labs: Recent Labs  Lab 06/18/18 1310  LATICACIDVEN 1.1    Recent Results (from the past 240 hour(s))  Culture, blood (Routine x 2)     Status: Abnormal   Collection Time: 06/18/18  1:10 PM  Result Value Ref Range Status   Specimen Description BLOOD LEFT ANTECUBITAL  Final   Special Requests   Final    BOTTLES DRAWN AEROBIC AND ANAEROBIC Blood Culture results may not be optimal due to an excessive volume of blood received in culture bottles   Culture  Setup Time   Final    GRAM NEGATIVE RODS IN BOTH AEROBIC AND ANAEROBIC BOTTLES CRITICAL VALUE NOTED.  VALUE IS CONSISTENT WITH PREVIOUSLY REPORTED AND CALLED VALUE.    Culture (A)  Final    KLEBSIELLA PNEUMONIAE SUSCEPTIBILITIES PERFORMED ON PREVIOUS CULTURE WITHIN THE LAST 5 DAYS. Performed at [facility], [place] [address], [place], [place] [address]    Report Status 06/21/2018 FINAL  Final  Culture, blood (Routine x 2)     Status: Abnormal   Collection Time: 06/18/18  1:35 PM  Result Value Ref Range Status   Specimen Description BLOOD LEFT HAND  Final   Special Requests   Final    BOTTLES DRAWN AEROBIC AND ANAEROBIC Blood Culture adequate volume   Culture  Setup Time   Final    GRAM NEGATIVE RODS IN BOTH AEROBIC AND ANAEROBIC BOTTLES CRITICAL RESULT CALLED TO, READ BACK BY AND VERIFIED WITH: SINCLAIRE PHARMD AT 5852 DP [id number] BY SJW Performed at [facility], [place] [address] [place], [place] [address]    Culture (A)  Final    KLEBSIELLA PNEUMONIAE Confirmed Extended Spectrum Beta-Lactamase Producer (ESBL).  In bloodstream infections from ESBL organisms, carbapenems are preferred over piperacillin/tazobactam. They are shown to have a lower risk of mortality.    Report Status 06/21/2018 FINAL  Final   Organism ID, Bacteria KLEBSIELLA PNEUMONIAE  Final      Susceptibility   Klebsiella pneumoniae - MIC*     AMPICILLIN >=32 RESISTANT Resistant     CEFAZOLIN RESISTANT Resistant     CEFEPIME RESISTANT Resistant     CEFTAZIDIME RESISTANT Resistant     CEFTRIAXONE RESISTANT Resistant     CIPROFLOXACIN 0.5 SENSITIVE Sensitive     GENTAMICIN 8 INTERMEDIATE Intermediate     IMIPENEM <=0.25 SENSITIVE Sensitive     TRIMETH/SULFA >=320 RESISTANT Resistant     AMPICILLIN/SULBACTAM 8 SENSITIVE Sensitive     PIP/TAZO <=4 SENSITIVE Sensitive     Extended ESBL POSITIVE Resistant     * KLEBSIELLA PNEUMONIAE  Blood Culture ID Panel (Reflexed)     Status: Abnormal   Collection Time: 06/18/18  1:35  PM  Result Value Ref Range Status   Enterococcus species NOT DETECTED NOT DETECTED Final   Listeria monocytogenes NOT DETECTED NOT DETECTED Final   Staphylococcus species NOT DETECTED NOT DETECTED Final   Staphylococcus aureus (BCID) NOT DETECTED NOT DETECTED Final   Streptococcus species NOT DETECTED NOT DETECTED Final   Streptococcus agalactiae NOT DETECTED NOT DETECTED Final   Streptococcus pneumoniae NOT DETECTED NOT DETECTED Final   Streptococcus pyogenes NOT DETECTED NOT DETECTED Final   Acinetobacter baumannii NOT DETECTED NOT DETECTED Final   Enterobacteriaceae species DETECTED (A) NOT DETECTED Final    Comment: Enterobacteriaceae represent a large family of gram-negative bacteria, not a single organism. CRITICAL RESULT CALLED TO, READ BACK BY AND VERIFIED WITH: SINCLAIRE PHARMD AT 0911 ON [id number] BY SJW    Enterobacter cloacae complex NOT DETECTED NOT DETECTED Final   Escherichia coli NOT DETECTED NOT DETECTED Final   Klebsiella oxytoca NOT DETECTED NOT DETECTED Final   Klebsiella pneumoniae DETECTED (A) NOT DETECTED Final    Comment: CRITICAL RESULT CALLED TO, READ BACK BY AND VERIFIED WITH: SINCLAIRE PHARMD AT 7622 ON [id number] BY SJW    Proteus species NOT DETECTED NOT DETECTED Final   Serratia marcescens NOT DETECTED NOT DETECTED Final   Carbapenem resistance NOT DETECTED NOT DETECTED Final    Haemophilus influenzae NOT DETECTED NOT DETECTED Final   Neisseria meningitidis NOT DETECTED NOT DETECTED Final   Pseudomonas aeruginosa NOT DETECTED NOT DETECTED Final   Candida albicans NOT DETECTED NOT DETECTED Final   Candida glabrata NOT DETECTED NOT DETECTED Final   Candida krusei NOT DETECTED NOT DETECTED Final   Candida parapsilosis NOT DETECTED NOT DETECTED Final   Candida tropicalis NOT DETECTED NOT DETECTED Final    Comment: Performed at [facility], [place] [address], [place], [place] [address]  SARS Coronavirus 2 (CEPHEID- Performed in [facility] lab), Hosp Order     Status: None   Collection Time: 06/18/18  1:58 PM  Result Value Ref Range Status   SARS Coronavirus 2 NEGATIVE NEGATIVE Final    Comment: (NOTE) If result is NEGATIVE SARS-CoV-2 target nucleic acids are NOT DETECTED. The SARS-CoV-2 RNA is generally detectable in upper and lower  respiratory specimens during the acute phase of infection. The lowest  concentration of SARS-CoV-2 viral copies this assay can detect is 250  copies / mL. A negative result does not preclude SARS-CoV-2 infection  and should not be used as the sole basis for treatment or other  patient management decisions.  A negative result may occur with  improper specimen collection / handling, submission of specimen other  than nasopharyngeal swab, presence of viral mutation(s) within the  areas targeted by this assay, and inadequate number of viral copies  (<250 copies / mL). A negative result must be combined with clinical  observations, patient history, and epidemiological information. If result is POSITIVE SARS-CoV-2 target nucleic acids are DETECTED. The SARS-CoV-2 RNA is generally detectable in upper and lower  respiratory specimens dur ing the acute phase of infection.  Positive  results are indicative of active infection with SARS-CoV-2.  Clinical  correlation with patient history and other diagnostic information is   necessary to determine patient infection status.  Positive results do  not rule out bacterial infection or co-infection with other viruses. If result is PRESUMPTIVE POSTIVE SARS-CoV-2 nucleic acids MAY BE PRESENT.   A presumptive positive result was obtained on the submitted specimen  and confirmed on repeat testing.  While 2019 novel coronavirus  (  SARS-CoV-2) nucleic acids may be present in the submitted sample  additional confirmatory testing may be necessary for epidemiological  and / or clinical management purposes  to differentiate between  SARS-CoV-2 and other Sarbecovirus currently known to infect humans.  If clinically indicated additional testing with an alternate test  methodology [id number]) is advised. The SARS-CoV-2 RNA is generally  detectable in upper and lower respiratory sp ecimens during the acute  phase of infection. The expected result is Negative. Fact Sheet for Patients:  StrictlyIdeas.no Fact Sheet for Healthcare Providers: BankingDealers.co.za This test is not yet approved or cleared by the [place] FDA and has been authorized for detection and/or diagnosis of SARS-CoV-2 by FDA under an Emergency Use Authorization (EUA).  This EUA will remain in effect (meaning this test can be used) for the duration of the COVID-19 declaration under Section 564(b)(1) of the Act, 21 U.S.C. section 360bbb-3(b)(1), unless the authorization is terminated or revoked sooner. Performed at [facility], [place] [address], [place], [place] [address]   Culture, Urine     Status: Abnormal   Collection Time: 06/18/18 10:45 PM  Result Value Ref Range Status   Specimen Description URINE, CLEAN CATCH  Final   Special Requests   Final    URINE, CLEAN CATCH Performed at [facility], [place] [address], [place], [place] [address]    Culture (A)  Final    >=100,000 COLONIES/mL KLEBSIELLA PNEUMONIAE Confirmed Extended Spectrum  Beta-Lactamase Producer (ESBL).  In bloodstream infections from ESBL organisms, carbapenems are preferred over piperacillin/tazobactam. They are shown to have a lower risk of mortality.    Report Status 06/20/2018 FINAL  Final   Organism ID, Bacteria KLEBSIELLA PNEUMONIAE (A)  Final      Susceptibility   Klebsiella pneumoniae - MIC*    AMPICILLIN >=32 RESISTANT Resistant     CEFAZOLIN >=64 RESISTANT Resistant     CEFTRIAXONE RESISTANT Resistant     CIPROFLOXACIN 0.5 SENSITIVE Sensitive     GENTAMICIN >=16 RESISTANT Resistant     IMIPENEM <=0.25 SENSITIVE Sensitive     NITROFURANTOIN 64 INTERMEDIATE Intermediate     TRIMETH/SULFA >=320 RESISTANT Resistant     AMPICILLIN/SULBACTAM 8 SENSITIVE Sensitive     PIP/TAZO <=4 SENSITIVE Sensitive     Extended ESBL POSITIVE Resistant     * >=100,000 COLONIES/mL KLEBSIELLA PNEUMONIAE  Culture, blood (routine x 2)     Status: None (Preliminary result)   Collection Time: 06/20/18  1:36 PM  Result Value Ref Range Status   Specimen Description BLOOD RIGHT ANTECUBITAL  Final   Special Requests AEROBIC BOTTLE ONLY Blood Culture adequate volume  Final   Culture   Final    NO GROWTH 1 DAY Performed at [facility], [place] [address], [place], [place] [address]    Report Status PENDING  Incomplete  Culture, blood (routine x 2)     Status: None (Preliminary result)   Collection Time: 06/20/18  1:36 PM  Result Value Ref Range Status   Specimen Description BLOOD RIGHT ANTECUBITAL  Final   Special Requests AEROBIC BOTTLE ONLY Blood Culture adequate volume  Final   Culture   Final    NO GROWTH 1 DAY Performed at [facility], [place] [address] [place], [place] [address]    Report Status PENDING  Incomplete         Radiology Studies: No results found.      Scheduled Meds: . calcium acetate  667 mg Oral TID WC  .  carbamazepine  700 mg Oral BID  . Chlorhexidine Gluconate Cloth  6 each Topical Q0600  . cholecalciferol  2,000 Units  Oral Daily  . darbepoetin (ARANESP) injection - DIALYSIS  200 mcg Intravenous Q Thu-HD  . feeding supplement (NEPRO CARB STEADY)  237 mL Oral BID BM  . feeding supplement (PRO-STAT SUGAR FREE 64)  30 mL Oral BID  . ferrous sulfate  325 mg Oral Q breakfast  . heparin  5,000 Units Subcutaneous Q8H  . insulin aspart  0-5 Units Subcutaneous QHS  . insulin aspart  0-9 Units Subcutaneous TID WC  . insulin aspart  6 Units Subcutaneous TID WC  . insulin glargine  12 Units Subcutaneous BID  . lactulose  10 g Oral Q T,Th,S,Su  . lactulose  20 g Oral Q M,W,F  . lipase/protease/amylase  12,000 Units Oral TID WC  . multivitamin  1 tablet Oral QHS  . PHENobarbital  226.8 mg Oral QHS  . simvastatin  20 mg Oral QHS  . tamsulosin  0.4 mg Oral QHS   Continuous Infusions: . meropenem (MERREM) IV 500 mg (06/21/18 1343)     LOS: 3 days   Time spent=40 mins    Makyi Ledo Arsenio Loader, MD [facility]  If 7PM-7AM, please contact night-coverage [email or web address] 06/22/2018, 1:14 PM

## 2018-06-22 NOTE — Progress Notes (Signed)
[facility] Progress Note   Subjective:  Seen in room, aggravated at "too many people coming in and out".  Afebrile.   Objective Vitals:   06/21/18 2125 06/22/18 0500 06/22/18 0518 06/22/18 0912  BP: (!) 152/91  99/60 114/70  Pulse: 95  84 80  Resp: 18  18 18   Temp: 98.9 F (37.2 C)  98.4 F (36.9 C) 98.3 F (36.8 C)  TempSrc: Oral  Oral Oral  SpO2: 97%  96% 98%  Weight: 106.5 kg 106.5 kg    Height:       Physical Exam General:Overweight man, NAD Heart:RRR; no murmur Lungs:CTA anteriorly Abdomen:soft, non-tender Extremities:No LE edema; scaly feet/legs - likely icthyosis Dialysis Access: [facility]  Additional Objective Labs: Basic Metabolic Panel: Recent Labs  Lab 06/20/18 1640 06/21/18 0839 06/22/18 0628  NA 134* 134* 135  K 3.0* 3.1* 3.6  CL 98 98 99  CO2 24 27 24   GLUCOSE 240* 240* 245*  BUN 32* 41* 27*  CREATININE 5.00* 5.91* 4.24*  CALCIUM 7.6* 7.7* 7.8*  PHOS 2.8 2.9  --    Liver Function Tests: Recent Labs  Lab 06/18/18 1319 06/19/18 0504 06/20/18 1640 06/21/18 0839  AST 14* 11*  --   --   ALT 11 10  --   --   ALKPHOS 112 100  --   --   BILITOT 0.7 0.8  --   --   PROT 6.2* 5.5*  --   --   ALBUMIN 2.1* 1.9* 2.0* 1.8*   Recent Labs  Lab 06/18/18 1334  LIPASE 12   CBC: Recent Labs  Lab 06/18/18 1319 06/19/18 0504 06/20/18 0427 06/20/18 1640 06/21/18 0839 06/22/18 0628  WBC 12.5* 8.5 6.3 6.2 6.1 6.8  NEUTROABS 10.8*  --   --   --   --   --   HGB 9.1* 8.1* 7.6* 8.5* 7.8* 8.0*  HCT 28.2* 25.0* 23.8* 26.4* 24.7* 24.8*  MCV 93.7 91.9 92.2 93.0 92.2 92.9  PLT 143* 121* 110* 120* PLATELET CLUMPS NOTED ON SMEAR, UNABLE TO ESTIMATE 146*   Blood Culture    Component Value Date/Time   SDES BLOOD RIGHT ANTECUBITAL 06/20/2018 1336   SDES BLOOD RIGHT ANTECUBITAL 06/20/2018 1336   SPECREQUEST AEROBIC BOTTLE ONLY Blood Culture adequate volume 06/20/2018 1336   SPECREQUEST AEROBIC BOTTLE ONLY Blood Culture adequate volume  06/20/2018 1336   CULT  06/20/2018 1336    NO GROWTH 1 DAY Performed at [facility], [place] [address] [place], [place] [address]    CULT  06/20/2018 1336    NO GROWTH 1 DAY Performed at [facility], [place] [address], [place], [place] [address]    REPTSTATUS PENDING 06/20/2018 1336   REPTSTATUS PENDING 06/20/2018 1336   CBG: Recent Labs  Lab 06/21/18 1240 06/21/18 1651 06/21/18 2125 06/22/18 0703 06/22/18 1123  GLUCAP 106* 378* 350* 214* 239*   Medications: . meropenem (MERREM) IV 500 mg (06/21/18 1343)   . calcium acetate  667 mg Oral TID WC  . carbamazepine  700 mg Oral BID  . Chlorhexidine Gluconate Cloth  6 each Topical Q0600  . cholecalciferol  2,000 Units Oral Daily  . darbepoetin (ARANESP) injection - DIALYSIS  200 mcg Intravenous Q Thu-HD  . feeding supplement (NEPRO CARB STEADY)  237 mL Oral BID BM  . feeding supplement (PRO-STAT SUGAR FREE 64)  30 mL Oral BID  . ferrous sulfate  325 mg Oral Q breakfast  . heparin  5,000 Units Subcutaneous Q8H  .  insulin aspart  0-5 Units Subcutaneous QHS  . insulin aspart  0-9 Units Subcutaneous TID WC  . insulin aspart  6 Units Subcutaneous TID WC  . insulin glargine  12 Units Subcutaneous BID  . lactulose  10 g Oral Q T,Th,S,Su  . lactulose  20 g Oral Q M,W,F  . lipase/protease/amylase  12,000 Units Oral TID WC  . multivitamin  1 tablet Oral QHS  . PHENobarbital  226.8 mg Oral QHS  . simvastatin  20 mg Oral QHS  . tamsulosin  0.4 mg Oral QHS     AKI/ [facility] MWF 3.5h 400/800 114.5kg leaving below 3K/2.5Ca bath RIJ TDC Hep 3000 Venofer through 6/5  Mircera 100 last 5/15  no VDRA  Labs: hgb 9 18% sat Ca/P slight ^ -recently started on Ca acetate 1 ac iPTH ok Glu high 300 - 500s Cr 5.37 5/13 Cr 4.6 5/18  Assessment/Plan: 1. Pyelonephritis/Klebsiella bacteremia: BCx and UCx 5/25 growing Klebsiella. Repeat BCx negative. To get 7d IV abx then 7d po abx. Return to SNF once done w/ IV abx.  2. AKI:  On HD since 4/30. Had dialysis here off sched on Tues and Thursday. Plan HD tomorrow / Sat , then back to MWF schedle.  3. Hypertension/volume: BP slightly high, weights variable - trying to avoid hypotension. 4. Anemia: Hgb 8.5, holding venofer d/t bacteremia - on PO iron. Resuming Aranesp 200 q week. 5. Metabolic bone disease: Corr Ca/Phos ok.No binders or VDRA needed 6. Nutrition: Alb very low. Hxcirrhosis plusmultiple hospitalizations. Continue Nepro/Pro-stat. 7. DM: LastA1c 8.3 in March  8. Cirrhosis:On lactulose. 9. H/o cognitive delay  10. H/o debility:SNF dependent x about 1 year   Eddie Splinter  MD 06/22/2018, 1:54 PM

## 2018-06-22 NOTE — Progress Notes (Signed)
Physical Therapy Treatment Patient Details Name: Eddie Davies MRN: [id number] DOB: [birth year] Today's Date: 06/22/2018    History of Present Illness Pt is a 48 y.o. male resident at [facility], admitted on 06/18/18 with fever and abdominal pain. CT abdomen concerning for recurrent or ongoing infectious pyelonephritis/cytstisis. Of note, recent admission 4/25-05/31/18 wtih ARF subsequently started on HD. PMH includes ESRD (on HD since 05/24/18), chronic pancreatitis, DM2, liver cirrhosis, anemia, tobacco abuse.    PT Comments    Patient refusing to transfer or attempt OOB mobility, "now that I want to sleep everyone wants to come in". Agreeable to bed level therex. Reinforced form and dosage, discussed goals for ongoing therapy. Cont to rec SNF.      Follow Up Recommendations  SNF;Supervision/Assistance - 24 hour     Equipment Recommendations  None recommended by PT    Recommendations for Other Services       Precautions / Restrictions Precautions Precautions: Fall Precaution Comments: incontinent of stool Restrictions Weight Bearing Restrictions: No    Mobility  Bed Mobility Overal bed mobility: Needs Assistance Bed Mobility: Rolling     Supine to sit: Min assist;HOB elevated   Sit to sidelying: +2 for physical assistance;Mod assist General bed mobility comments: MinA for rolling and repositioning in bed; pt able to slide himself up with BUE support pulling on rails and cues for sequencing. Declined sitting or OOB mobility  Transfers                    Ambulation/Gait                 Stairs             Wheelchair Mobility    Modified Rankin (Stroke Patients Only)       Balance                                            Cognition Arousal/Alertness: Awake/alert Behavior During Therapy: WFL for tasks assessed/performed Overall Cognitive Status: No family/caregiver present to determine baseline cognitive  functioning Area of Impairment: Memory;Following commands;Problem solving;Safety/judgement;Attention;Awareness                 Orientation Level: Disoriented to;Situation;Time Current Attention Level: Sustained Memory: Decreased short-term memory Following Commands: Follows one step commands with increased time Safety/Judgement: Decreased awareness of deficits;Decreased awareness of safety Awareness: Emergent Problem Solving: Slow processing;Difficulty sequencing;Decreased initiation;Requires verbal cues;Requires tactile cues General Comments: Baseline cognitive deficits. Pt upset with PT coming before HD, "Just when one of you leave, the next person comes, I cannot catch a break." Discussed preference for time of day for PT treatment but pt never answering this question. Not forthcoming with details of PLOF. Following commands consistently when he wanted to      Exercises General Exercises - Lower Extremity Ankle Circles/Pumps: 20 reps Quad Sets: 20 reps Heel Slides: 20 reps Hip ABduction/ADduction: 20 reps Straight Leg Raises: 20 reps    General Comments        Pertinent Vitals/Pain      Home Living                      Prior Function            PT Goals (current goals can now be found in the care plan section) Acute Rehab PT Goals PT Goal Formulation:  With patient Time For Goal Achievement: 07/03/18 Potential to Achieve Goals: Fair Progress towards PT goals: Progressing toward goals    Frequency    Min 2X/week      PT Plan Current plan remains appropriate    Co-evaluation              AM-PAC PT "6 Clicks" Mobility   Outcome Measure  Help needed turning from your back to your side while in a flat bed without using bedrails?: A Little Help needed moving from lying on your back to sitting on the side of a flat bed without using bedrails?: A Little Help needed moving to and from a bed to a chair (including a wheelchair)?: A Lot Help  needed standing up from a chair using your arms (e.g., wheelchair or bedside chair)?: A Lot Help needed to walk in hospital room?: A Lot Help needed climbing 3-5 steps with a railing? : Total 6 Click Score: 13    End of Session   Activity Tolerance: Other (comment) Patient left: in bed;with call bell/phone within reach;with bed alarm set Nurse Communication: Mobility status PT Visit Diagnosis: Other abnormalities of gait and mobility (R26.89);Muscle weakness (generalized) (M62.81)     Time: 7371-0626 PT Time Calculation (min) (ACUTE ONLY): 20 min  Charges:  $Therapeutic Exercise: 8-22 mins                    Reinaldo Berber, PT, DPT Acute Rehabilitation Services Pager: [phone] Office: [place] 06/22/2018, 9:44 AM

## 2018-06-23 LAB — BASIC METABOLIC PANEL
Anion gap: 12 (ref 5–15)
BUN: 37 mg/dL — ABNORMAL HIGH (ref 6–20)
CO2: 23 mmol/L (ref 22–32)
Calcium: 7.7 mg/dL — ABNORMAL LOW (ref 8.9–10.3)
Chloride: 97 mmol/L — ABNORMAL LOW (ref 98–111)
Creatinine, Ser: 5.59 mg/dL — ABNORMAL HIGH (ref 0.61–1.24)
GFR calc Af Amer: 13 mL/min — ABNORMAL LOW (ref >60)
GFR calc non Af Amer: 11 mL/min — ABNORMAL LOW (ref >60)
Glucose, Bld: 201 mg/dL — ABNORMAL HIGH (ref 70–99)
Potassium: 3.5 mmol/L (ref 3.5–5.1)
Sodium: 132 mmol/L — ABNORMAL LOW (ref 135–145)

## 2018-06-23 LAB — GLUCOSE, CAPILLARY
Glucose-Capillary: 181 mg/dL — ABNORMAL HIGH (ref 70–99)
Glucose-Capillary: 247 mg/dL — ABNORMAL HIGH (ref 70–99)
Glucose-Capillary: 140 mg/dL — ABNORMAL HIGH (ref 70–99)
Glucose-Capillary: 231 mg/dL — ABNORMAL HIGH (ref 70–99)

## 2018-06-23 LAB — CBC
HCT: 22.9 % — ABNORMAL LOW (ref 39.0–52.0)
Hemoglobin: 7.5 g/dL — ABNORMAL LOW (ref 13.0–17.0)
MCH: 30.1 pg (ref 26.0–34.0)
MCHC: 32.8 g/dL (ref 30.0–36.0)
MCV: 92 fL (ref 80.0–100.0)
Platelets: 185 10*3/uL (ref 150–400)
RBC: 2.49 MIL/uL — ABNORMAL LOW (ref 4.22–5.81)
RDW: 16.1 % — ABNORMAL HIGH (ref 11.5–15.5)
WBC: 7 10*3/uL (ref 4.0–10.5)
nRBC: 0 % (ref 0.0–0.2)

## 2018-06-23 LAB — MAGNESIUM: Magnesium: 2 mg/dL (ref 1.7–2.4)

## 2018-06-23 MED ORDER — INSULIN GLARGINE 100 UNIT/ML ~~LOC~~ SOLN
15.0000 [IU] | Freq: Two times a day (BID) | SUBCUTANEOUS | Status: DC
  Administered 2018-06-23 – 2018-06-25 (×4): 15 [IU] via SUBCUTANEOUS
  Filled 2018-06-23 (×5): qty 0.15

## 2018-06-23 MED ORDER — HEPARIN SODIUM (PORCINE) 1000 UNIT/ML IJ SOLN
INTRAMUSCULAR | Status: AC
  Filled 2018-06-23: qty 4

## 2018-06-23 NOTE — Progress Notes (Addendum)
[facility] Progress Note   Subjective:  Seen in room. Sleepy, denies pain/dyspnea today.  Objective Vitals:   06/22/18 1627 06/22/18 2051 06/23/18 0610 06/23/18 1000  BP: (!) 150/92 (!) 143/85 118/80 124/82  Pulse: 85 89 72   Resp: 18 18 18 18   Temp: 98.5 F (36.9 C) 97.6 F (36.4 C) 97.8 F (36.6 C) 98.2 F (36.8 C)  TempSrc: Oral   Oral  SpO2: 99% 98% 99% 98%  Weight:  106 kg    Height:       Physical Exam General:Overweight man, NAD. + seborrheic dermatitis. Heart:RRR; no murmur Lungs:CTA anteriorly Abdomen:soft, non-tender Extremities:No LE edema; scaly feet/legs (?) icthyosis Dialysis Access:TDC  Additional Objective Labs: Basic Metabolic Panel: Recent Labs  Lab 06/20/18 1640 06/21/18 0839 06/22/18 0628 06/23/18 0456  NA 134* 134* 135 132*  K 3.0* 3.1* 3.6 3.5  CL 98 98 99 97*  CO2 24 27 24 23   GLUCOSE 240* 240* 245* 201*  BUN 32* 41* 27* 37*  CREATININE 5.00* 5.91* 4.24* 5.59*  CALCIUM 7.6* 7.7* 7.8* 7.7*  PHOS 2.8 2.9  --   --    Liver Function Tests: Recent Labs  Lab 06/18/18 1319 06/19/18 0504 06/20/18 1640 06/21/18 0839  AST 14* 11*  --   --   ALT 11 10  --   --   ALKPHOS 112 100  --   --   BILITOT 0.7 0.8  --   --   PROT 6.2* 5.5*  --   --   ALBUMIN 2.1* 1.9* 2.0* 1.8*   CBC: Recent Labs  Lab 06/18/18 1319  06/20/18 0427 06/20/18 1640 06/21/18 0839 06/22/18 0628 06/23/18 0456  WBC 12.5*   < > 6.3 6.2 6.1 6.8 7.0  NEUTROABS 10.8*  --   --   --   --   --   --   HGB 9.1*   < > 7.6* 8.5* 7.8* 8.0* 7.5*  HCT 28.2*   < > 23.8* 26.4* 24.7* 24.8* 22.9*  MCV 93.7   < > 92.2 93.0 92.2 92.9 92.0  PLT 143*   < > 110* 120* PLATELET CLUMPS NOTED ON SMEAR, UNABLE TO ESTIMATE 146* 185   < > = values in this interval not displayed.    Medications: . meropenem (MERREM) IV Stopped (06/22/18 1433)   . calcium acetate  667 mg Oral TID WC  . carbamazepine  700 mg Oral BID  . Chlorhexidine Gluconate Cloth  6 each Topical  Q0600  . cholecalciferol  2,000 Units Oral Daily  . darbepoetin (ARANESP) injection - DIALYSIS  200 mcg Intravenous Q Thu-HD  . feeding supplement (NEPRO CARB STEADY)  237 mL Oral BID BM  . feeding supplement (PRO-STAT SUGAR FREE 64)  30 mL Oral BID  . ferrous sulfate  325 mg Oral Q breakfast  . heparin  5,000 Units Subcutaneous Q8H  . insulin aspart  0-5 Units Subcutaneous QHS  . insulin aspart  0-9 Units Subcutaneous TID WC  . insulin aspart  6 Units Subcutaneous TID WC  . insulin glargine  12 Units Subcutaneous BID  . lactulose  10 g Oral Q T,Th,S,Su  . lactulose  20 g Oral Q M,W,F  . lipase/protease/amylase  12,000 Units Oral TID WC  . multivitamin  1 tablet Oral QHS  . PHENobarbital  226.8 mg Oral QHS  . simvastatin  20 mg Oral QHS  . tamsulosin  0.4 mg Oral QHS    Dialysis Orders: AKI/ [facility] MWF 3.5h 400/800  114.5kg leaving below 3K/2.5Ca bath RIJ TDC Hep 3000 Venofer through 6/5  Mircera 100 last 5/15  no VDRA  Labs: hgb 9 18% sat Ca/P slight ^ -recently started on Ca acetate 1 ac iPTH ok Glu high 300 - 500s Cr 5.37 5/13, Cr 4.6 5/18  Assessment/Plan: 1. Pyelonephritis/Klebsiella bacteremia: BCxand UCx5/25 growing Klebsiella. Repeat BCx negative. To get 7d IV abx then 7d po abx. Return to SNF once done w/ IV abx.  2. AKI: On HD since 4/30. Off schedule this week - for HD today, then back to usual MWF schedule next week. No signs of any recovery at this point. 3. Hypertension/volume: BP controlled - trying to avoid hypotension. 4. Anemia: Hgb87.5, holdingvenoferd/t bacteremia - on PO iron. Continue Aranesp 200 q Thurs. 5. Metabolic bone disease: Corr Ca/Phos ok.No binders or VDRA needed 6. Nutrition: Alb very low. Hxcirrhosis plusmultiple hospitalizations. Continue Nepro/Pro-stat. 7. DM: LastA1c 8.3 in March  8. Cirrhosis:On lactulose. 9. Hx cognitive delay  10. Hx debility:SNF dependent x about 1 year   Veneta Penton, Hershal Coria 06/23/2018, 10:54 AM   [facility] Pager: [phone]  Pt seen, examined and agree w A/P as above.  Kelly Splinter  MD 06/23/2018, 5:08 PM

## 2018-06-23 NOTE — Progress Notes (Signed)
PROGRESS NOTE    Eddie Davies  MQK:[id number] DOB: [birth year] DOA: 06/18/2018 PCP: [facility], [facility]   Brief Narrative:  48 year old with history of ESRD on hemodialysis since 05/24/2018, bilateral nonobstructive nephrolithiasis, chronic pancreatitis, diabetes mellitus type 2, cirrhosis, thrombocytopenia, anemia, ESBL/Klebsiella UTI in the past presented with fever abdominal pain.  CT of the abdomen pelvis showed concerns for pyelonephritis/cystitis without hydronephrosis.  COVID-negative.  Bacteremia-Klebsiella.  Currently on meropenem.  Repeat blood cultures are negative.  Infectious disease recommends 7 days of IV antibiotics followed by 7 days of oral Cipro.  Last day of IV antibiotics 5/31.  Case also discussed with urology given nonobstructive stone, recommends outpatient follow-up.   Assessment & Plan:   Principal Problem:   Bacteremia due to Klebsiella pneumoniae Active Problems:   Seizures ([place])   Benign essential HTN   Diabetes mellitus type 2, uncontrolled, with complications (HCC)   Thrombocytopenia (HCC)   Fever   ESRD (end stage renal disease) on dialysis (HCC)   Anemia due to chronic kidney disease   Leukocytosis  Bacteremia due to Klebsiella pneumonia; recurrent Urosepsis with concerns for pyelonephritis/cystitis - Likely the source of this is urine.  Initial blood cultures from 5/25+.  Repeat surveillance cultures -negative to date - Spoke with Dr. Johnnye Sima from infectious disease, recommends 7 days of IV antibiotics followed by 7 days of oral Cipro unless if his clinical status changes.  For now okay to leave tunneled HD catheter in as this with bacteremia and sepsis was even present prior to having tunneled catheter placed.  Tunneled catheter site looks clean and without any obvious evidence of infection at the moment.  Last day of IV meropenem tomorrow -Continue supportive care.  COVID-negative  Acute kidney injury on hemodialysis - Started on  hemodialysis 4/30 on Monday Wednesday Friday schedule.  Follow nephrology team recommendations.  Dialysis sessions per their service.  Anemia of chronic disease - Venofer is on hold due to bacteremia.  Continue Aranesp  Uncontrolled insulin-dependent diabetes mellitus type 2 with renal complications -Increase NovoLog to 6 units 3 times daily with meals, increase Lantus to 15 units twice daily - Hemoglobin A1c once ago 8.3.  History of cirrhosis -On lactulose  Chronic thrombocytopenia -No obvious signs of bleeding.  Liver cirrhosis.  Continue to monitor this.  History of seizures -Continue home regimen of phenobarbital and Tegretol  Generalized deconditioning -PT eval-recommending SNF  Hx of Right sided Non obstruction renal stone- unsure of this the culprit for recurrent sepsis and UTI. Spoke with Dr Junious Silk from urology, he doesn't think it is the cause of it. He recommends cont current management and have him follow up outpatient.    DVT prophylaxis: Subcutaneous heparin Code Status: Full code Family Communication: None at bedside Disposition Plan: Maintain hospital stay for IV antibiotics.  Last day of IV antibiotics tomorrow and then will switch him over to p.o.  Consultants:   Nephrology  Procedures:   None  Antimicrobials:   Urine culture- Klebsiella  Blood cultures- Klebsiella  Repeat cultures no growth to date   Subjective: Sleepy this morning but no complaints.  He understands why he needs to stay in the hospital another day for IV antibiotics.  Review of Systems Otherwise negative except as per HPI, including: General = no fevers, chills, dizziness, malaise, fatigue HEENT/EYES = negative for pain, redness, loss of vision, double vision, blurred vision, loss of hearing, sore throat, hoarseness, dysphagia Cardiovascular= negative for chest pain, palpitation, murmurs, lower extremity swelling Respiratory/lungs= negative for shortness  of breath, cough,  hemoptysis, wheezing, mucus production Gastrointestinal= negative for nausea, vomiting,, abdominal pain, melena, hematemesis Genitourinary= negative for Dysuria, Hematuria, Change in Urinary Frequency MSK = Negative for arthralgia, myalgias, Back Pain, Joint swelling  Neurology= Negative for headache, seizures, numbness, tingling  Psychiatry= Negative for anxiety, depression, suicidal and homocidal ideation Allergy/Immunology= Medication/Food allergy as listed  Skin= Negative for Rash, lesions, ulcers, itching   Objective: Vitals:   06/22/18 1627 06/22/18 2051 06/23/18 0610 06/23/18 1000  BP: (!) 150/92 (!) 143/85 118/80 124/82  Pulse: 85 89 72   Resp: 18 18 18 18   Temp: 98.5 F (36.9 C) 97.6 F (36.4 C) 97.8 F (36.6 C) 98.2 F (36.8 C)  TempSrc: Oral   Oral  SpO2: 99% 98% 99% 98%  Weight:  106 kg    Height:        Intake/Output Summary (Last 24 hours) at 06/23/2018 1353 Last data filed at 06/23/2018 0911 Gross per 24 hour  Intake 100 ml  Output 1 ml  Net 99 ml   Filed Weights   06/21/18 2125 06/22/18 0500 06/22/18 2051  Weight: 106.5 kg 106.5 kg 106 kg    Examination: Constitutional: NAD, calm, comfortable Eyes: PERRL, lids and conjunctivae normal ENMT: Mucous membranes are moist. Posterior pharynx clear of any exudate or lesions.Normal dentition.  Neck: normal, supple, no masses, no thyromegaly Respiratory: clear to auscultation bilaterally, no wheezing, no crackles. Normal respiratory effort. No accessory muscle use.  Cardiovascular: Regular rate and rhythm, no murmurs / rubs / gallops. No extremity edema. 2+ pedal pulses. No carotid bruits.  Abdomen: no tenderness, no masses palpated. No hepatosplenomegaly. Bowel sounds positive.  Musculoskeletal: no clubbing / cyanosis. No joint deformity upper and lower extremities. Good ROM, no contractures. Normal muscle tone.  Skin: Bilateral lower and upper extremity scaly skin Neurologic: CN 2-12 grossly intact. Sensation  intact, DTR normal. Strength 5/5 in all 4.  Psychiatric: Normal judgment and insight. Alert and oriented x 3. Normal mood.   Data Reviewed:   CBC: Recent Labs  Lab 06/18/18 1319  06/20/18 0427 06/20/18 1640 06/21/18 0839 06/22/18 0628 06/23/18 0456  WBC 12.5*   < > 6.3 6.2 6.1 6.8 7.0  NEUTROABS 10.8*  --   --   --   --   --   --   HGB 9.1*   < > 7.6* 8.5* 7.8* 8.0* 7.5*  HCT 28.2*   < > 23.8* 26.4* 24.7* 24.8* 22.9*  MCV 93.7   < > 92.2 93.0 92.2 92.9 92.0  PLT 143*   < > 110* 120* PLATELET CLUMPS NOTED ON SMEAR, UNABLE TO ESTIMATE 146* 185   < > = values in this interval not displayed.   Basic Metabolic Panel: Recent Labs  Lab 06/20/18 0427 06/20/18 1640 06/21/18 0839 06/22/18 0628 06/23/18 0456  NA 133* 134* 134* 135 132*  K 2.8* 3.0* 3.1* 3.6 3.5  CL 96* 98 98 99 97*  CO2 24 24 27 24 23   GLUCOSE 379* 240* 240* 245* 201*  BUN 25* 32* 41* 27* 37*  CREATININE 4.34* 5.00* 5.91* 4.24* 5.59*  CALCIUM 7.4* 7.6* 7.7* 7.8* 7.7*  MG  --   --   --  1.9 2.0  PHOS  --  2.8 2.9  --   --    GFR: Estimated Creatinine Clearance: 21.2 mL/min (A) (by C-G formula based on SCr of 5.59 mg/dL (H)). Liver Function Tests: Recent Labs  Lab 06/18/18 1319 06/19/18 0504 06/20/18 1640 06/21/18 0839  AST 14*  11*  --   --   ALT 11 10  --   --   ALKPHOS 112 100  --   --   BILITOT 0.7 0.8  --   --   PROT 6.2* 5.5*  --   --   ALBUMIN 2.1* 1.9* 2.0* 1.8*   Recent Labs  Lab 06/18/18 1334  LIPASE 12   No results for input(s): AMMONIA in the last 168 hours. Coagulation Profile: Recent Labs  Lab 06/18/18 1319  INR 1.9*   Cardiac Enzymes: No results for input(s): CKTOTAL, CKMB, CKMBINDEX, TROPONINI in the last 168 hours. BNP (last 3 results) No results for input(s): PROBNP in the last 8760 hours. HbA1C: No results for input(s): HGBA1C in the last 72 hours. CBG: Recent Labs  Lab 06/22/18 1123 06/22/18 1624 06/22/18 2051 06/23/18 0610 06/23/18 1114  GLUCAP 239* 228* 245*  181* 247*   Lipid Profile: No results for input(s): CHOL, HDL, LDLCALC, TRIG, CHOLHDL, LDLDIRECT in the last 72 hours. Thyroid Function Tests: No results for input(s): TSH, T4TOTAL, FREET4, T3FREE, THYROIDAB in the last 72 hours. Anemia Panel: No results for input(s): VITAMINB12, FOLATE, FERRITIN, TIBC, IRON, RETICCTPCT in the last 72 hours. Sepsis Labs: Recent Labs  Lab 06/18/18 1310  LATICACIDVEN 1.1    Recent Results (from the past 240 hour(s))  Culture, blood (Routine x 2)     Status: Abnormal   Collection Time: 06/18/18  1:10 PM  Result Value Ref Range Status   Specimen Description BLOOD LEFT ANTECUBITAL  Final   Special Requests   Final    BOTTLES DRAWN AEROBIC AND ANAEROBIC Blood Culture results may not be optimal due to an excessive volume of blood received in culture bottles   Culture  Setup Time   Final    GRAM NEGATIVE RODS IN BOTH AEROBIC AND ANAEROBIC BOTTLES CRITICAL VALUE NOTED.  VALUE IS CONSISTENT WITH PREVIOUSLY REPORTED AND CALLED VALUE.    Culture (A)  Final    KLEBSIELLA PNEUMONIAE SUSCEPTIBILITIES PERFORMED ON PREVIOUS CULTURE WITHIN THE LAST 5 DAYS. Performed at [facility], [place] [address], [place], [place] [address]    Report Status 06/21/2018 FINAL  Final  Culture, blood (Routine x 2)     Status: Abnormal   Collection Time: 06/18/18  1:35 PM  Result Value Ref Range Status   Specimen Description BLOOD LEFT HAND  Final   Special Requests   Final    BOTTLES DRAWN AEROBIC AND ANAEROBIC Blood Culture adequate volume   Culture  Setup Time   Final    GRAM NEGATIVE RODS IN BOTH AEROBIC AND ANAEROBIC BOTTLES CRITICAL RESULT CALLED TO, READ BACK BY AND VERIFIED WITH: SINCLAIRE PHARMD AT 3329 JJ [id number] BY SJW Performed at [facility], [place] [address] [place], [place] [address]    Culture (A)  Final    KLEBSIELLA PNEUMONIAE Confirmed Extended Spectrum Beta-Lactamase Producer (ESBL).  In bloodstream infections from ESBL organisms, carbapenems  are preferred over piperacillin/tazobactam. They are shown to have a lower risk of mortality.    Report Status 06/21/2018 FINAL  Final   Organism ID, Bacteria KLEBSIELLA PNEUMONIAE  Final      Susceptibility   Klebsiella pneumoniae - MIC*    AMPICILLIN >=32 RESISTANT Resistant     CEFAZOLIN RESISTANT Resistant     CEFEPIME RESISTANT Resistant     CEFTAZIDIME RESISTANT Resistant     CEFTRIAXONE RESISTANT Resistant     CIPROFLOXACIN 0.5 SENSITIVE Sensitive     GENTAMICIN 8 INTERMEDIATE Intermediate  IMIPENEM <=0.25 SENSITIVE Sensitive     TRIMETH/SULFA >=320 RESISTANT Resistant     AMPICILLIN/SULBACTAM 8 SENSITIVE Sensitive     PIP/TAZO <=4 SENSITIVE Sensitive     Extended ESBL POSITIVE Resistant     * KLEBSIELLA PNEUMONIAE  Blood Culture ID Panel (Reflexed)     Status: Abnormal   Collection Time: 06/18/18  1:35 PM  Result Value Ref Range Status   Enterococcus species NOT DETECTED NOT DETECTED Final   Listeria monocytogenes NOT DETECTED NOT DETECTED Final   Staphylococcus species NOT DETECTED NOT DETECTED Final   Staphylococcus aureus (BCID) NOT DETECTED NOT DETECTED Final   Streptococcus species NOT DETECTED NOT DETECTED Final   Streptococcus agalactiae NOT DETECTED NOT DETECTED Final   Streptococcus pneumoniae NOT DETECTED NOT DETECTED Final   Streptococcus pyogenes NOT DETECTED NOT DETECTED Final   Acinetobacter baumannii NOT DETECTED NOT DETECTED Final   Enterobacteriaceae species DETECTED (A) NOT DETECTED Final    Comment: Enterobacteriaceae represent a large family of gram-negative bacteria, not a single organism. CRITICAL RESULT CALLED TO, READ BACK BY AND VERIFIED WITH: SINCLAIRE PHARMD AT 0911 ON [id number] BY SJW    Enterobacter cloacae complex NOT DETECTED NOT DETECTED Final   Escherichia coli NOT DETECTED NOT DETECTED Final   Klebsiella oxytoca NOT DETECTED NOT DETECTED Final   Klebsiella pneumoniae DETECTED (A) NOT DETECTED Final    Comment: CRITICAL RESULT CALLED  TO, READ BACK BY AND VERIFIED WITH: SINCLAIRE PHARMD AT 2841 ON [id number] BY SJW    Proteus species NOT DETECTED NOT DETECTED Final   Serratia marcescens NOT DETECTED NOT DETECTED Final   Carbapenem resistance NOT DETECTED NOT DETECTED Final   Haemophilus influenzae NOT DETECTED NOT DETECTED Final   Neisseria meningitidis NOT DETECTED NOT DETECTED Final   Pseudomonas aeruginosa NOT DETECTED NOT DETECTED Final   Candida albicans NOT DETECTED NOT DETECTED Final   Candida glabrata NOT DETECTED NOT DETECTED Final   Candida krusei NOT DETECTED NOT DETECTED Final   Candida parapsilosis NOT DETECTED NOT DETECTED Final   Candida tropicalis NOT DETECTED NOT DETECTED Final    Comment: Performed at [facility], [place] [address] [place], [place] [address]  SARS Coronavirus 2 (CEPHEID- Performed in [place] hospital lab), Hosp Order     Status: None   Collection Time: 06/18/18  1:58 PM  Result Value Ref Range Status   SARS Coronavirus 2 NEGATIVE NEGATIVE Final    Comment: (NOTE) If result is NEGATIVE SARS-CoV-2 target nucleic acids are NOT DETECTED. The SARS-CoV-2 RNA is generally detectable in upper and lower  respiratory specimens during the acute phase of infection. The lowest  concentration of SARS-CoV-2 viral copies this assay can detect is 250  copies / mL. A negative result does not preclude SARS-CoV-2 infection  and should not be used as the sole basis for treatment or other  patient management decisions.  A negative result may occur with  improper specimen collection / handling, submission of specimen other  than nasopharyngeal swab, presence of viral mutation(s) within the  areas targeted by this assay, and inadequate number of viral copies  (<250 copies / mL). A negative result must be combined with clinical  observations, patient history, and epidemiological information. If result is POSITIVE SARS-CoV-2 target nucleic acids are DETECTED. The SARS-CoV-2 RNA is generally  detectable in upper and lower  respiratory specimens dur ing the acute phase of infection.  Positive  results are indicative of active infection with SARS-CoV-2.  Clinical  correlation with patient history  and other diagnostic information is  necessary to determine patient infection status.  Positive results do  not rule out bacterial infection or co-infection with other viruses. If result is PRESUMPTIVE POSTIVE SARS-CoV-2 nucleic acids MAY BE PRESENT.   A presumptive positive result was obtained on the submitted specimen  and confirmed on repeat testing.  While 2019 novel coronavirus  (SARS-CoV-2) nucleic acids may be present in the submitted sample  additional confirmatory testing may be necessary for epidemiological  and / or clinical management purposes  to differentiate between  SARS-CoV-2 and other Sarbecovirus currently known to infect humans.  If clinically indicated additional testing with an alternate test  methodology [phone]) is advised. The SARS-CoV-2 RNA is generally  detectable in upper and lower respiratory sp ecimens during the acute  phase of infection. The expected result is Negative. Fact Sheet for Patients:  StrictlyIdeas.no Fact Sheet for Healthcare Providers: BankingDealers.co.za This test is not yet approved or cleared by the [place] FDA and has been authorized for detection and/or diagnosis of SARS-CoV-2 by FDA under an Emergency Use Authorization (EUA).  This EUA will remain in effect (meaning this test can be used) for the duration of the COVID-19 declaration under Section 564(b)(1) of the Act, 21 U.S.C. section 360bbb-3(b)(1), unless the authorization is terminated or revoked sooner. Performed at [facility], [place] [address] [place], [place] [address]   Culture, Urine     Status: Abnormal   Collection Time: 06/18/18 10:45 PM  Result Value Ref Range Status   Specimen Description URINE, CLEAN  CATCH  Final   Special Requests   Final    URINE, CLEAN CATCH Performed at [facility], [place] [address], [place], [place] [address]    Culture (A)  Final    >=100,000 COLONIES/mL KLEBSIELLA PNEUMONIAE Confirmed Extended Spectrum Beta-Lactamase Producer (ESBL).  In bloodstream infections from ESBL organisms, carbapenems are preferred over piperacillin/tazobactam. They are shown to have a lower risk of mortality.    Report Status 06/20/2018 FINAL  Final   Organism ID, Bacteria KLEBSIELLA PNEUMONIAE (A)  Final      Susceptibility   Klebsiella pneumoniae - MIC*    AMPICILLIN >=32 RESISTANT Resistant     CEFAZOLIN >=64 RESISTANT Resistant     CEFTRIAXONE RESISTANT Resistant     CIPROFLOXACIN 0.5 SENSITIVE Sensitive     GENTAMICIN >=16 RESISTANT Resistant     IMIPENEM <=0.25 SENSITIVE Sensitive     NITROFURANTOIN 64 INTERMEDIATE Intermediate     TRIMETH/SULFA >=320 RESISTANT Resistant     AMPICILLIN/SULBACTAM 8 SENSITIVE Sensitive     PIP/TAZO <=4 SENSITIVE Sensitive     Extended ESBL POSITIVE Resistant     * >=100,000 COLONIES/mL KLEBSIELLA PNEUMONIAE  Culture, blood (routine x 2)     Status: None (Preliminary result)   Collection Time: 06/20/18  1:36 PM  Result Value Ref Range Status   Specimen Description BLOOD RIGHT ANTECUBITAL  Final   Special Requests AEROBIC BOTTLE ONLY Blood Culture adequate volume  Final   Culture   Final    NO GROWTH 3 DAYS Performed at [facility], [place] [address], [place], [place] [address]    Report Status PENDING  Incomplete  Culture, blood (routine x 2)     Status: None (Preliminary result)   Collection Time: 06/20/18  1:36 PM  Result Value Ref Range Status   Specimen Description BLOOD RIGHT ANTECUBITAL  Final   Special Requests AEROBIC BOTTLE ONLY Blood Culture adequate volume  Final  Culture   Final    NO GROWTH 3 DAYS Performed at [facility], [place] [address] [place],  [address]    Report Status PENDING  Incomplete          Radiology Studies: No results found.      Scheduled Meds: . calcium acetate  667 mg Oral TID WC  . carbamazepine  700 mg Oral BID  . Chlorhexidine Gluconate Cloth  6 each Topical Q0600  . cholecalciferol  2,000 Units Oral Daily  . darbepoetin (ARANESP) injection - DIALYSIS  200 mcg Intravenous Q Thu-HD  . feeding supplement (NEPRO CARB STEADY)  237 mL Oral BID BM  . feeding supplement (PRO-STAT SUGAR FREE 64)  30 mL Oral BID  . ferrous sulfate  325 mg Oral Q breakfast  . heparin  5,000 Units Subcutaneous Q8H  . insulin aspart  0-5 Units Subcutaneous QHS  . insulin aspart  0-9 Units Subcutaneous TID WC  . insulin aspart  6 Units Subcutaneous TID WC  . insulin glargine  12 Units Subcutaneous BID  . lactulose  10 g Oral Q T,Th,S,Su  . lactulose  20 g Oral Q M,W,F  . lipase/protease/amylase  12,000 Units Oral TID WC  . multivitamin  1 tablet Oral QHS  . PHENobarbital  226.8 mg Oral QHS  . simvastatin  20 mg Oral QHS  . tamsulosin  0.4 mg Oral QHS   Continuous Infusions: . meropenem (MERREM) IV Stopped (06/22/18 1433)     LOS: 4 days   Time spent=25 mins     Arsenio Loader, MD [facility]  If 7PM-7AM, please contact night-coverage [email or web address] 06/23/2018, 1:53 PM

## 2018-06-24 LAB — BASIC METABOLIC PANEL
Anion gap: 11 (ref 5–15)
BUN: 24 mg/dL — ABNORMAL HIGH (ref 6–20)
CO2: 22 mmol/L (ref 22–32)
Calcium: 7.7 mg/dL — ABNORMAL LOW (ref 8.9–10.3)
Chloride: 101 mmol/L (ref 98–111)
Creatinine, Ser: 4.2 mg/dL — ABNORMAL HIGH (ref 0.61–1.24)
GFR calc Af Amer: 18 mL/min — ABNORMAL LOW (ref >60)
GFR calc non Af Amer: 16 mL/min — ABNORMAL LOW (ref >60)
Glucose, Bld: 201 mg/dL — ABNORMAL HIGH (ref 70–99)
Potassium: 3.8 mmol/L (ref 3.5–5.1)
Sodium: 134 mmol/L — ABNORMAL LOW (ref 135–145)

## 2018-06-24 LAB — GLUCOSE, CAPILLARY
Glucose-Capillary: 221 mg/dL — ABNORMAL HIGH (ref 70–99)
Glucose-Capillary: 174 mg/dL — ABNORMAL HIGH (ref 70–99)
Glucose-Capillary: 166 mg/dL — ABNORMAL HIGH (ref 70–99)
Glucose-Capillary: 154 mg/dL — ABNORMAL HIGH (ref 70–99)

## 2018-06-24 LAB — MAGNESIUM: Magnesium: 1.9 mg/dL (ref 1.7–2.4)

## 2018-06-24 MED ORDER — DARBEPOETIN ALFA 200 MCG/0.4ML IJ SOSY: 200.0000 ug | PREFILLED_SYRINGE | INTRAMUSCULAR | Status: DC

## 2018-06-24 MED ORDER — CIPROFLOXACIN HCL 500 MG PO TABS: 500.0000 mg | ORAL_TABLET | Freq: Every day | ORAL | 0 refills | Status: AC

## 2018-06-24 NOTE — Progress Notes (Addendum)
[facility] Progress Note   Subjective: Seen in room - very sleepy again, but denies any pain   Objective Vitals:   06/23/18 1650 06/23/18 2058 06/24/18 0427 06/24/18 0921  BP: 134/72 133/85 (!) 143/83 136/78  Pulse: 86 74 79 75  Resp: 20 20 18 18   Temp: 98 F (36.7 C) 98.4 F (36.9 C) 98.5 F (36.9 C) 98.6 F (37 C)  TempSrc: Oral Oral  Oral  SpO2: 98% 97% 97% 98%  Weight:  109.4 kg    Height:       Physical Exam General:Overweight man, NAD. + seborrheic dermatitis. Heart:RRR; no murmur Lungs:CTA anteriorly Abdomen:soft, non-tender Extremities:No LE edema; scaly feet/legs (?) icthyosis Dialysis Access:TDC  Additional Objective Labs: Basic Metabolic Panel: Recent Labs  Lab 06/20/18 1640 06/21/18 0839 06/22/18 0628 06/23/18 0456 06/24/18 0502  NA 134* 134* 135 132* 134*  K 3.0* 3.1* 3.6 3.5 3.8  CL 98 98 99 97* 101  CO2 24 27 24 23 22   GLUCOSE 240* 240* 245* 201* 201*  BUN 32* 41* 27* 37* 24*  CREATININE 5.00* 5.91* 4.24* 5.59* 4.20*  CALCIUM 7.6* 7.7* 7.8* 7.7* 7.7*  PHOS 2.8 2.9  --   --   --    Liver Function Tests: Recent Labs  Lab 06/18/18 1319 06/19/18 0504 06/20/18 1640 06/21/18 0839  AST 14* 11*  --   --   ALT 11 10  --   --   ALKPHOS 112 100  --   --   BILITOT 0.7 0.8  --   --   PROT 6.2* 5.5*  --   --   ALBUMIN 2.1* 1.9* 2.0* 1.8*   Recent Labs  Lab 06/18/18 1334  LIPASE 12   CBC: Recent Labs  Lab 06/18/18 1319  06/20/18 0427 06/20/18 1640 06/21/18 0839 06/22/18 0628 06/23/18 0456  WBC 12.5*   < > 6.3 6.2 6.1 6.8 7.0  NEUTROABS 10.8*  --   --   --   --   --   --   HGB 9.1*   < > 7.6* 8.5* 7.8* 8.0* 7.5*  HCT 28.2*   < > 23.8* 26.4* 24.7* 24.8* 22.9*  MCV 93.7   < > 92.2 93.0 92.2 92.9 92.0  PLT 143*   < > 110* 120* PLATELET CLUMPS NOTED ON SMEAR, UNABLE TO ESTIMATE 146* 185   < > = values in this interval not displayed.   Blood Culture    Component Value Date/Time   SDES BLOOD RIGHT ANTECUBITAL  06/20/2018 1336   SDES BLOOD RIGHT ANTECUBITAL 06/20/2018 1336   SPECREQUEST AEROBIC BOTTLE ONLY Blood Culture adequate volume 06/20/2018 1336   SPECREQUEST AEROBIC BOTTLE ONLY Blood Culture adequate volume 06/20/2018 1336   CULT  06/20/2018 1336    NO GROWTH 3 DAYS Performed at [facility], [place] [address], [place], [place] [address]    CULT  06/20/2018 1336    NO GROWTH 3 DAYS Performed at [facility], [place] [employer] [address] [place], [place] [address]    REPTSTATUS PENDING 06/20/2018 1336   REPTSTATUS PENDING 06/20/2018 1336    CBG: Recent Labs  Lab 06/23/18 0610 06/23/18 1114 06/23/18 1648 06/23/18 2058 06/24/18 0645  GLUCAP 181* 247* 140* 231* 221*   Medications: . meropenem (MERREM) IV 500 mg (06/23/18 1755)   . calcium acetate  667 mg Oral TID WC  . carbamazepine  700 mg Oral BID  . Chlorhexidine Gluconate Cloth  6 each Topical Q0600  . cholecalciferol  2,000 Units Oral Daily  . darbepoetin (ARANESP) injection - DIALYSIS  200 mcg Intravenous Q Thu-HD  . feeding supplement (NEPRO CARB STEADY)  237 mL Oral BID BM  . feeding supplement (PRO-STAT SUGAR FREE 64)  30 mL Oral BID  . ferrous sulfate  325 mg Oral Q breakfast  . heparin  5,000 Units Subcutaneous Q8H  . insulin aspart  0-5 Units Subcutaneous QHS  . insulin aspart  0-9 Units Subcutaneous TID WC  . insulin aspart  6 Units Subcutaneous TID WC  . insulin glargine  15 Units Subcutaneous BID  . lactulose  10 g Oral Q T,Th,S,Su  . lactulose  20 g Oral Q M,W,F  . lipase/protease/amylase  12,000 Units Oral TID WC  . multivitamin  1 tablet Oral QHS  . PHENobarbital  226.8 mg Oral QHS  . simvastatin  20 mg Oral QHS  . tamsulosin  0.4 mg Oral QHS    Dialysis Orders: AKI/ [facility] MWF 3.5h 400/800 114.5kg leaving below 3K/2.5Ca bath RIJ TDC Hep 3000 Venofer through 6/5  Mircera 100 last 5/15  no VDRA  Labs: hgb 9 18% sat Ca/P slight ^ -recently started on Ca acetate 1 ac iPTH ok Glu high 300 -  500s Cr 5.37 5/13, Cr 4.6 5/18  Assessment/Plan: 1. Pyelonephritis/Klebsiella bacteremia: BCxand UCx5/25 growing Klebsiella. Repeat BCx 5/27negative. To get 7d IV Meropenem, then 7d PO abx. Return to SNF once done w/ IV abx. 2. AKI: On HDsince 4/30. Off schedule this week, will return to prior MWF schedule starting 6/1. No signs of any recovery at this point. 3. Hypertension/volume: BP controlled - trying to avoid hypotension. 4. Anemia: Hgb7.5, holdingvenoferd/t bacteremia - on PO iron. Continue Aranesp 200 q Thurs. 5. Metabolic bone disease: Corr Ca/Phos ok.No binders or VDRA needed 6. Nutrition: Alb very low. Hxcirrhosis plusmultiple hospitalizations. Continue Nepro/Pro-stat. 7. DM: LastA1c 8.3% in 03/2018.  8. Cirrhosis:On lactulose. 9. Hx cognitive delay  10. Hx debility:SNF dependent x about 1 year   Veneta Penton, Hershal Coria 06/24/2018, 10:26 AM  [facility] Pager: [phone]  Pt seen, examined and agree w A/P as above. Have d/w primary MD, trying to d/c back to SNF if possible.  Kelly Splinter  MD 06/24/2018, 11:09 AM

## 2018-06-24 NOTE — Progress Notes (Signed)
Pharmacy Antibiotic Note  Eddie Davies is a 48 y.o. male admitted on 06/18/2018 with N/V, fever and left sided abdominal tenderness. Pharmacy consulted 06/18/18  for meropenem. Patient has PMH significant for recent history of ESBL (K. Pneumoniae from expectorated sputum on 04/22/18). AKI>> ESRD on HD  Klebsiella pneumoniae bacteremia; ESBL Kleb pneumo UTI.  Vancomycin discontinued 06/19/18.  Continues on Meropenem.   Plan: Meropenem 500 mg IV q24h Monitor CBC and clinical progression   Height: 6' 2"  (188 cm) Weight: 241 lb 2.9 oz (109.4 kg) IBW/kg (Calculated) : 82.2  Temp (24hrs), Avg:98.3 F (36.8 C), Min:98 F (36.7 C), Max:98.5 F (36.9 C)  Recent Labs  Lab 06/18/18 1310  06/20/18 0427 06/20/18 1640 06/21/18 0839 06/22/18 0628 06/23/18 0456 06/24/18 0502  WBC  --    < > 6.3 6.2 6.1 6.8 7.0  --   CREATININE  --    < > 4.34* 5.00* 5.91* 4.24* 5.59* 4.20*  LATICACIDVEN 1.1  --   --   --   --   --   --   --    < > = values in this interval not displayed.    Estimated Creatinine Clearance: 28.6 mL/min (A) (by C-G formula based on SCr of 4.2 mg/dL (H)).    Allergies  Allergen Reactions  . Fish Oil Swelling and Other (See Comments)        Antimicrobials this admission: Meropenem 5/25 >>  Vancomycin 5/25 >>5/26  Dose adjustments this admission: N/A  Microbiology results: 5/25 BCx: 2/4 bottles (2/2 sets) positive for Klebsiella pneumoniae (recent history of ESBL Klebsiella in March of 2020 in respiratory culture) 5/25 COVID: negative  5/25 urine: >100,000 colonies/mL ESBL Klebsiella pneumoniae 5/27: BCx: ngtd   Thank you for allowing pharmacy to be a part of this patient's care. Anette Guarneri, PharmD Please check AMION for all Pharmacist numbers by unit 06/24/2018 9:02 AM

## 2018-06-24 NOTE — Progress Notes (Signed)
CSW received a phone call from RN stating that the patient is ready for discharge. CSW called and left a voicemail with Cecille Rubin, admissions director about the patient. CSW called the facility to speak with her as well, receptionist said that she is not in and she would be back on Monday. CSW is awaiting a return phone call, patient more likely to be able to discharge back to SNF on Monday once admissions director is back in the office.   CSW will continue to assist with discharge.   Domenic Schwab, MSW, [place]

## 2018-06-24 NOTE — Discharge Summary (Signed)
Physician Discharge Summary  Eddie Davies TML:[id number] DOB: 1970-05-24 DOA: 06/18/2018  PCP: [facility], [place] date: 06/18/2018 Discharge date: 06/24/2018  Ad[facility]: [name] Disposition:  Back to his SNF  Recommendations for Outpatient Follow-up:  1. Follow up with PCP in 1-2 weeks 2. Please obtain BMP/CBC in one week your next doctors visit.  3. Oral Cipro for 7 days, 534m po daily (Renally dosed) 4. Follow up with outpatient Urology in 2-4 weeks for Nonobstructive renal stone and BPH. Issues with Recurrent UTI/Sepsis   Discharge Condition: Stable CODE STATUS: Full Diet recommendation:Renal   Brief/Interim Summary: 48year old with history of ESRD on hemodialysis since 05/24/2018, bilateral nonobstructive nephrolithiasis, chronic pancreatitis, diabetes mellitus type 2, cirrhosis, thrombocytopenia, anemia, ESBL/Klebsiella UTI in the past presented with fever abdominal pain.  CT of the abdomen pelvis showed concerns for pyelonephritis/cystitis without hydronephrosis.  COVID-negative.  Bacteremia-Klebsiella.  Currently on meropenem.  Repeat blood cultures are negative.  Infectious disease recommends 7 days of IV antibiotics followed by 7 days of oral Cipro.  Last day of IV antibiotics 5/31.  7 days of renally dosed Cipro orally. Case also discussed with urology given nonobstructive stone, recommends outpatient follow-up.  Follow-up outpatient as discussed above. Patient stable for discharge.   Discharge Diagnoses:  Principal Problem:   Bacteremia due to Klebsiella pneumoniae Active Problems:   Seizures ([place]   Benign essential HTN   Diabetes mellitus type 2, uncontrolled, with complications (HCC)   Thrombocytopenia (HCC)   Fever   ESRD (end stage renal disease) on dialysis (HCC)   Anemia due to chronic kidney disease   Leukocytosis  Bacteremia due to Klebsiella pneumonia; recurrent Urosepsis with concerns for pyelonephritis/cystitis - Likely  the source of this is urine.  Initial blood cultures from 5/25+.  Repeat surveillance cultures -negative to date - Spoke with Dr. HJohnnye Simafrom infectious disease, recommends 7 days of IV antibiotics followed by 7 days of oral Cipro unless if his clinical status changes.  For now okay to leave tunneled HD catheter in as this with bacteremia and sepsis was even present prior to having tunneled catheter placed.  Tunneled catheter site looks clean and without any obvious evidence of infection at the moment.  Switch to 7 days of PO cipro- renally dose.  -Continue supportive care.  COVID-negative  Acute kidney injury on hemodialysis, MWF schedule.  - Started on hemodialysis 4/30 on Monday Wednesday Friday schedule.  Follow nephrology team recommendations.  Dialysis sessions per their service.  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  History of cirrhosis -On lactulose  Chronic thrombocytopenia -No obvious signs of bleeding.  Liver cirrhosis.  Continue to monitor this.  History of seizures -Continue home regimen of phenobarbital and Tegretol  Generalized deconditioning -PT eval-recommending SNF  Hx of Right sided Non obstruction renal stone- unsure of this the culprit for recurrent sepsis and UTI. Spoke with Dr EJunious Silkfrom urology, he doesn't think it is the cause of it. He recommends cont current management and have him follow up outpatient.    Consultations:  Nephro  Curbsided ID and Urology.   Subjective: No complaints.  Despite of eating his morning breakfast he tells me he is very hungry and would like more food.  Otherwise okay with going back to his facility today.  Discharge Exam: Vitals:   06/23/18 2058 06/24/18 0427  BP:  133/85 (!) 143/83  Pulse: 74 79  Resp: 20 18  Temp: 98.4 F (36.9 C) 98.5 F (36.9 C)   SpO2: 97% 97%   Vitals:   06/23/18 1557 06/23/18 1650 06/23/18 2058 06/24/18 0427  BP: 136/70 134/72 133/85 (!) 143/83  Pulse: 83 86 74 79  Resp: 19 20 20 18   Temp: 98.4 F (36.9 C) 98 F (36.7 C) 98.4 F (36.9 C) 98.5 F (36.9 C)  TempSrc: Oral Oral Oral   SpO2: 98% 98% 97% 97%  Weight: 109.4 kg  109.4 kg   Height:        General: Pt is alert, awake, not in acute distress Cardiovascular: RRR, S1/S2 +, no rubs, no gallops Respiratory: CTA bilaterally, no wheezing, no rhonchi Abdominal: Soft, NT, ND, bowel sounds + Extremities: no edema, no cyanosis B/l LE scaly leisons on legs.   Discharge Instructions  Discharge Instructions    Call MD for:  extreme fatigue   Complete by:  As directed    Call MD for:  redness, tenderness, or signs of infection (pain, swelling, redness, odor or green/yellow discharge around incision site)   Complete by:  As directed    Call MD for:  temperature >100.4   Complete by:  As directed    Diet - low sodium heart healthy   Complete by:  As directed    Increase activity slowly   Complete by:  As directed      Allergies as of 06/24/2018      Reactions   Fish Oil Swelling, Other (See Comments)         Medication List    TAKE these medications   acetaminophen 325 MG tablet Commonly known as:  TYLENOL Take 650 mg by mouth every 8 (eight) hours as needed for fever (pain).   Basaglar KwikPen 100 UNIT/ML Sopn Inject 0.13 mLs (13 Units total) into the skin at bedtime.   calcium acetate 667 MG capsule Commonly known as:  PHOSLO Take 667 mg by mouth 3 (three) times daily with meals.   carbamazepine 200 MG tablet Commonly known as:  TEGRETOL Take 600 mg by mouth 2 (two) times daily. Take with 152m for a total dose of 7020m   carbamazepine 100 MG chewable tablet Commonly known as:  TEGRETOL Chew 100 mg by mouth 2 (two) times daily. Take with 600 mg for a total dose of 700 mg.   ciprofloxacin 500 MG tablet Commonly known as:   Cipro Take 1 tablet (500 mg total) by mouth daily for 7 days.   Darbepoetin Alfa 200 MCG/0.4ML Sosy injection Commonly known as:  ARANESP Inject 0.4 mLs (200 mcg total) into the vein every Thursday with hemodialysis. Start taking on:  June 28, 2018   feeding supplement (NEPRO CARB STEADY) Liqd Take 237 mLs by mouth 2 (two) times daily between meals.   feeding supplement (PRO-STAT SUGAR FREE 64) Liqd Take 30 mLs by mouth 2 (two) times a day.   ferrous sulfate 325 (65 FE) MG tablet Take 325 mg by mouth daily with breakfast.   guaiFENesin 600 MG 12 hr tablet Commonly known as:  MUCINEX Take 600 mg by mouth every 12 (twelve) hours as needed for cough (congestion).   hydrocerin Crea Apply 1 application topically 2 (two) times daily. Apply to body   insulin aspart 100 UNIT/ML injection Commonly known as:  novoLOG 0-15 Units, Subcutaneous, 3 times daily with meals CBG < 70: implement hypoglycemia protocol CBG 70 - 120: 0 units CBG 121 -  150: 2 units CBG 151 - 200: 3 units CBG 201 - 250: 5 units CBG 251 - 300: 8 units CBG 301 - 350: 11 units CBG 351 - 400: 15 units CBG > 400: call MD   lactulose 10 GM/15ML solution Commonly known as:  CHRONULAC Take 15 mLs (10 g total) by mouth 2 (two) times daily. What changed:    how much to take  when to take this  additional instructions   multivitamin Tabs tablet Take 1 tablet by mouth at bedtime.   omeprazole 20 MG tablet Commonly known as:  PRILOSEC OTC Take 20 mg by mouth daily.   PHENobarbital 32.4 MG tablet Commonly known as:  LUMINAL Take 7 tablets (226.8 mg total) by mouth at bedtime.   simvastatin 20 MG tablet Commonly known as:  ZOCOR Take 20 mg by mouth at bedtime.   tamsulosin 0.4 MG Caps capsule Commonly known as:  FLOMAX Take 0.4 mg by mouth at bedtime.   Vitamin D 50 MCG (2000 UT) tablet Take 2,000 Units by mouth daily.      Follow-up Information    [facility], [facility]. Schedule an  appointment as soon as possible for a visit in 1 week(s).   Contact information: [place] [id number] [phone]        Festus Aloe, MD. Schedule an appointment as soon as possible for a visit in 2 week(s).   Specialty:  Urology Why:  Non Obstructive Renal Stone and BPH Contact information: [address] [place] [address] [phone]          Allergies  Allergen Reactions  . Fish Oil Swelling and Other (See Comments)         You were cared for by a hospitalist during your hospital stay. If you have any questions about your discharge medications or the care you received while you were in the hospital after you are discharged, you can call the unit and asked to speak with the hospitalist on call if the hospitalist that took care of you is not available. Once you are discharged, your primary care physician will handle any further medical issues. Please note that no refills for any discharge medications will be authorized once you are discharged, as it is imperative that you return to your primary care physician (or establish a relationship with a primary care physician if you do not have one) for your aftercare needs so that they can reassess your need for medications and monitor your lab values.   Procedures/Studies: Ct Abdomen Pelvis Wo Contrast  Result Date: 06/18/2018 CLINICAL DATA:  Abdominal pain, fever, abscess suspected EXAM: CT ABDOMEN AND PELVIS WITHOUT CONTRAST TECHNIQUE: Multidetector CT imaging of the abdomen and pelvis was performed following the standard protocol without IV contrast. COMPARISON:  05/19/2018 FINDINGS: Lower chest: No acute abnormality.  Artery calcifications. Hepatobiliary: Coarse contour of the liver. Status post cholecystectomy. No biliary dilatation. Pancreas: Pancreatic parenchymal calcifications and atrophy. No pancreatic ductal dilatation or surrounding inflammatory changes. Spleen: Splenomegaly, maximum span  approximately 18.7 cm. Adrenals/Urinary Tract: Adrenal glands are unremarkable. There is extensive perirenal and adjacent retroperitoneal fat stranding, similar in appearance to prior examination. Unchanged 10 mm calculus in the right renal pelvis and punctuate nonobstructive calculi in the left kidney. No hydronephrosis. Mild thickening and fat stranding about the urinary bladder. Stomach/Bowel: Stomach is within normal limits. Appendix is not clearly visualized and may be surgically absent. No evidence of bowel wall thickening, distention, or inflammatory changes. Vascular/Lymphatic: No  significant vascular findings are present. No enlarged abdominal or pelvic lymph nodes. Reproductive: No mass or other abnormality. Other: No abdominal wall hernia or abnormality. Evidence of prior umbilical hernia repair. No abdominopelvic ascites. Musculoskeletal: No acute or significant osseous findings. IMPRESSION: 1. There is extensive perirenal and adjacent retroperitoneal fat stranding, similar in appearance to prior examination. Unchanged 10 mm calculus in the right renal pelvis and punctuate nonobstructive calculi in the left kidney. No hydronephrosis. Mild thickening and fat stranding about the urinary bladder. This constellation of findings is similar to prior examination and may reflect recurrent or ongoing infectious or inflammatory pyelonephritis/cystitis. Correlate with urinalysis. 2.  Stigmata of cirrhosis and splenomegaly. 3.  Stigmata of chronic pancreatitis. Electronically Signed   By: Eddie Candle M.D.   On: 06/18/2018 15:54   Ir Fluoro Guide Cv Line Right  Result Date: 05/30/2018 INDICATION: END-STAGE RENAL DISEASE EXAM: ULTRASOUND GUIDANCE FOR VASCULAR ACCESS RIGHT INTERNAL JUGULAR PERMANENT HEMODIALYSIS CATHETER Date:  05/30/2018 05/30/2018 4:12 pm Radiologist:  Jerilynn Mages. Daryll Brod, MD Guidance:  Ultrasound fluoroscopic FLUOROSCOPY TIME:  Fluoroscopy Time: 0 minutes 36 seconds (9 mGy). MEDICATIONS: 2 g Ancef  administered within 1 hour the procedure ANESTHESIA/SEDATION: Moderate Sedation Time:  None. The patient was continuously monitored during the procedure by the interventional radiology nurse under my direct supervision. CONTRAST:  None. COMPLICATIONS: None immediate. PROCEDURE: Informed consent was obtained from the patient following explanation of the procedure, risks, benefits and alternatives. The patient understands, agrees and consents for the procedure. All questions were addressed. A time out was performed. Maximal barrier sterile technique utilized including caps, mask, sterile gowns, sterile gloves, large sterile drape, hand hygiene, and 2% chlorhexidine scrub. Under sterile conditions and local anesthesia, right internal jugular micropuncture venous access was performed with ultrasound. Images were obtained for documentation. A guide wire was inserted followed by a transitional dilator. Next, a 0.035 guidewire was advanced into the IVC with a 5-French catheter. Measurements were obtained from the right venotomy site to the proximal right atrium. In the right infraclavicular chest, a subcutaneous tunnel was created under sterile conditions and local anesthesia. 1% lidocaine with epinephrine was utilized for this. The 23 cm tip to cuff palindrome catheter was tunneled subcutaneously to the venotomy site and inserted into the SVC/RA junction through a valved peel-away sheath. Position was confirmed with fluoroscopy. Images were obtained for documentation. Blood was aspirated from the catheter followed by saline and heparin flushes. The appropriate volume and strength of heparin was instilled in each lumen. Caps were applied. The catheter was secured at the tunnel site with Gelfoam and a pursestring suture. The venotomy site was closed with subcuticular Vicryl suture. Dermabond was applied to the small right neck incision. A dry sterile dressing was applied. The catheter is ready for use. No immediate  complications. IMPRESSION: Ultrasound and fluoroscopically guided right internal jugular tunneled hemodialysis catheter (23 cm tip to cuff palindrome catheter). Electronically Signed   By: Jerilynn Mages.  Shick M.D.   On: 05/30/2018 16:15   Ir US Guide Vasc Access Right  Result Date: 05/30/2018 INDICATION: END-STAGE RENAL DISEASE EXAM: ULTRASOUND GUIDANCE FOR VASCULAR ACCESS RIGHT INTERNAL JUGULAR PERMANENT HEMODIALYSIS CATHETER Date:  05/30/2018 05/30/2018 4:12 pm Radiologist:  Jerilynn Mages. Daryll Brod, MD Guidance:  Ultrasound fluoroscopic FLUOROSCOPY TIME:  Fluoroscopy Time: 0 minutes 36 seconds (9 mGy). MEDICATIONS: 2 g Ancef administered within 1 hour the procedure ANESTHESIA/SEDATION: Moderate Sedation Time:  None. The patient was continuously monitored during the procedure by the interventional radiology nurse under my direct supervision. CONTRAST:  None. COMPLICATIONS: None immediate. PROCEDURE: Informed consent was obtained from the patient following explanation of the procedure, risks, benefits and alternatives. The patient understands, agrees and consents for the procedure. All questions were addressed. A time out was performed. Maximal barrier sterile technique utilized including caps, mask, sterile gowns, sterile gloves, large sterile drape, hand hygiene, and 2% chlorhexidine scrub. Under sterile conditions and local anesthesia, right internal jugular micropuncture venous access was performed with ultrasound. Images were obtained for documentation. A guide wire was inserted followed by a transitional dilator. Next, a 0.035 guidewire was advanced into the IVC with a 5-French catheter. Measurements were obtained from the right venotomy site to the proximal right atrium. In the right infraclavicular chest, a subcutaneous tunnel was created under sterile conditions and local anesthesia. 1% lidocaine with epinephrine was utilized for this. The 23 cm tip to cuff palindrome catheter was tunneled subcutaneously to the venotomy site  and inserted into the SVC/RA junction through a valved peel-away sheath. Position was confirmed with fluoroscopy. Images were obtained for documentation. Blood was aspirated from the catheter followed by saline and heparin flushes. The appropriate volume and strength of heparin was instilled in each lumen. Caps were applied. The catheter was secured at the tunnel site with Gelfoam and a pursestring suture. The venotomy site was closed with subcuticular Vicryl suture. Dermabond was applied to the small right neck incision. A dry sterile dressing was applied. The catheter is ready for use. No immediate complications. IMPRESSION: Ultrasound and fluoroscopically guided right internal jugular tunneled hemodialysis catheter (23 cm tip to cuff palindrome catheter). Electronically Signed   By: Jerilynn Mages.  Shick M.D.   On: 05/30/2018 16:15   Dg Chest Port 1 View  Result Date: 06/18/2018 CLINICAL DATA:  Fever, vomiting, shortness of breath. EXAM: PORTABLE CHEST 1 VIEW COMPARISON:  Chest x-ray from earlier same day. Chest x-ray dated 05/21/2018. FINDINGS: Heart size is upper normal. Lungs are clear. No pleural effusion or pneumothorax seen. Dialysis catheter is stable in position with tip at the level of the cavoatrial junction. Osseous structures about the chest are unremarkable. IMPRESSION: No active disease. No evidence of pneumonia or pulmonary edema. Electronically Signed   By: Franki Cabot M.D.   On: 06/18/2018 13:58      The results of significant diagnostics from this hospitalization (including imaging, microbiology, ancillary and laboratory) are listed below for reference.     Microbiology: Recent Results (from the past 240 hour(s))  Culture, blood (Routine x 2)     Status: Abnormal   Collection Time: 06/18/18  1:10 PM  Result Value Ref Range Status   Specimen Description BLOOD LEFT ANTECUBITAL  Final   Special Requests   Final    BOTTLES DRAWN AEROBIC AND ANAEROBIC Blood Culture results may not be optimal  due to an excessive volume of blood received in culture bottles   Culture  Setup Time   Final    GRAM NEGATIVE RODS IN BOTH AEROBIC AND ANAEROBIC BOTTLES CRITICAL VALUE NOTED.  VALUE IS CONSISTENT WITH PREVIOUSLY REPORTED AND CALLED VALUE.    Culture (A)  Final    KLEBSIELLA PNEUMONIAE SUSCEPTIBILITIES PERFORMED ON PREVIOUS CULTURE WITHIN THE LAST 5 DAYS. Performed at [facility], [place] [address] [place], [place] [address]    Report Status 06/21/2018 FINAL  Final  Culture, blood (Routine x 2)     Status: Abnormal   Collection Time: 06/18/18  1:35 PM  Result Value Ref Range Status   Specimen Description BLOOD LEFT HAND  Final  Special Requests   Final    BOTTLES DRAWN AEROBIC AND ANAEROBIC Blood Culture adequate volume   Culture  Setup Time   Final    GRAM NEGATIVE RODS IN BOTH AEROBIC AND ANAEROBIC BOTTLES CRITICAL RESULT CALLED TO, READ BACK BY AND VERIFIED WITH: SINCLAIRE PHARMD AT 0938 HW [id number] BY SJW Performed at [facility], [place] [address] [place], [place] [address]    Culture (A)  Final    KLEBSIELLA PNEUMONIAE Confirmed Extended Spectrum Beta-Lactamase Producer (ESBL).  In bloodstream infections from ESBL organisms, carbapenems are preferred over piperacillin/tazobactam. They are shown to have a lower risk of mortality.    Report Status 06/21/2018 FINAL  Final   Organism ID, Bacteria KLEBSIELLA PNEUMONIAE  Final      Susceptibility   Klebsiella pneumoniae - MIC*    AMPICILLIN >=32 RESISTANT Resistant     CEFAZOLIN RESISTANT Resistant     CEFEPIME RESISTANT Resistant     CEFTAZIDIME RESISTANT Resistant     CEFTRIAXONE RESISTANT Resistant     CIPROFLOXACIN 0.5 SENSITIVE Sensitive     GENTAMICIN 8 INTERMEDIATE Intermediate     IMIPENEM <=0.25 SENSITIVE Sensitive     TRIMETH/SULFA >=320 RESISTANT Resistant     AMPICILLIN/SULBACTAM 8 SENSITIVE Sensitive     PIP/TAZO <=4 SENSITIVE Sensitive     Extended ESBL POSITIVE Resistant     * KLEBSIELLA PNEUMONIAE   Blood Culture ID Panel (Reflexed)     Status: Abnormal   Collection Time: 06/18/18  1:35 PM  Result Value Ref Range Status   Enterococcus species NOT DETECTED NOT DETECTED Final   Listeria monocytogenes NOT DETECTED NOT DETECTED Final   Staphylococcus species NOT DETECTED NOT DETECTED Final   Staphylococcus aureus (BCID) NOT DETECTED NOT DETECTED Final   Streptococcus species NOT DETECTED NOT DETECTED Final   Streptococcus agalactiae NOT DETECTED NOT DETECTED Final   Streptococcus pneumoniae NOT DETECTED NOT DETECTED Final   Streptococcus pyogenes NOT DETECTED NOT DETECTED Final   Acinetobacter baumannii NOT DETECTED NOT DETECTED Final   Enterobacteriaceae species DETECTED (A) NOT DETECTED Final    Comment: Enterobacteriaceae represent a large family of gram-negative bacteria, not a single organism. CRITICAL RESULT CALLED TO, READ BACK BY AND VERIFIED WITH: SINCLAIRE PHARMD AT 0911 ON [id number] BY SJW    Enterobacter cloacae complex NOT DETECTED NOT DETECTED Final   Escherichia coli NOT DETECTED NOT DETECTED Final   Klebsiella oxytoca NOT DETECTED NOT DETECTED Final   Klebsiella pneumoniae DETECTED (A) NOT DETECTED Final    Comment: CRITICAL RESULT CALLED TO, READ BACK BY AND VERIFIED WITH: SINCLAIRE PHARMD AT 7510 ON [id number] BY SJW    Proteus species NOT DETECTED NOT DETECTED Final   Serratia marcescens NOT DETECTED NOT DETECTED Final   Carbapenem resistance NOT DETECTED NOT DETECTED Final   Haemophilus influenzae NOT DETECTED NOT DETECTED Final   Neisseria meningitidis NOT DETECTED NOT DETECTED Final   Pseudomonas aeruginosa NOT DETECTED NOT DETECTED Final   Candida albicans NOT DETECTED NOT DETECTED Final   Candida glabrata NOT DETECTED NOT DETECTED Final   Candida krusei NOT DETECTED NOT DETECTED Final   Candida parapsilosis NOT DETECTED NOT DETECTED Final   Candida tropicalis NOT DETECTED NOT DETECTED Final    Comment: Performed at [facility], [place] [address],  [place], [place] [address]  SARS Coronavirus 2 (CEPHEID- Performed in [facility] hospital lab), Hosp Order     Status: None   Collection Time: 06/18/18  1:58 PM  Result Value Ref Range Status  SARS Coronavirus 2 NEGATIVE NEGATIVE Final    Comment: (NOTE) If result is NEGATIVE SARS-CoV-2 target nucleic acids are NOT DETECTED. The SARS-CoV-2 RNA is generally detectable in upper and lower  respiratory specimens during the acute phase of infection. The lowest  concentration of SARS-CoV-2 viral copies this assay can detect is 250  copies / mL. A negative result does not preclude SARS-CoV-2 infection  and should not be used as the sole basis for treatment or other  patient management decisions.  A negative result may occur with  improper specimen collection / handling, submission of specimen other  than nasopharyngeal swab, presence of viral mutation(s) within the  areas targeted by this assay, and inadequate number of viral copies  (<250 copies / mL). A negative result must be combined with clinical  observations, patient history, and epidemiological information. If result is POSITIVE SARS-CoV-2 target nucleic acids are DETECTED. The SARS-CoV-2 RNA is generally detectable in upper and lower  respiratory specimens dur ing the acute phase of infection.  Positive  results are indicative of active infection with SARS-CoV-2.  Clinical  correlation with patient history and other diagnostic information is  necessary to determine patient infection status.  Positive results do  not rule out bacterial infection or co-infection with other viruses. If result is PRESUMPTIVE POSTIVE SARS-CoV-2 nucleic acids MAY BE PRESENT.   A presumptive positive result was obtained on the submitted specimen  and confirmed on repeat testing.  While 2019 novel coronavirus  (SARS-CoV-2) nucleic acids may be present in the submitted sample  additional confirmatory testing may be necessary for epidemiological  and / or  clinical management purposes  to differentiate between  SARS-CoV-2 and other Sarbecovirus currently known to infect humans.  If clinically indicated additional testing with an alternate test  methodology [phone]) is advised. The SARS-CoV-2 RNA is generally  detectable in upper and lower respiratory sp ecimens during the acute  phase of infection. The expected result is Negative. Fact Sheet for Patients:  StrictlyIdeas.no Fact Sheet for Healthcare Providers: BankingDealers.co.za This test is not yet approved or cleared by the [place] FDA and has been authorized for detection and/or diagnosis of SARS-CoV-2 by FDA under an Emergency Use Authorization (EUA).  This EUA will remain in effect (meaning this test can be used) for the duration of the COVID-19 declaration under Section 564(b)(1) of the Act, 21 U.S.C. section 360bbb-3(b)(1), unless the authorization is terminated or revoked sooner. Performed at [facility], [place] [address] [place], [place] [address]   Culture, Urine     Status: Abnormal   Collection Time: 06/18/18 10:45 PM  Result Value Ref Range Status   Specimen Description URINE, CLEAN CATCH  Final   Special Requests   Final    URINE, CLEAN CATCH Performed at [facility], [place] [address] [place], [place] [address]    Culture (A)  Final    >=100,000 COLONIES/mL KLEBSIELLA PNEUMONIAE Confirmed Extended Spectrum Beta-Lactamase Producer (ESBL).  In bloodstream infections from ESBL organisms, carbapenems are preferred over piperacillin/tazobactam. They are shown to have a lower risk of mortality.    Report Status 06/20/2018 FINAL  Final   Organism ID, Bacteria KLEBSIELLA PNEUMONIAE (A)  Final      Susceptibility   Klebsiella pneumoniae - MIC*    AMPICILLIN >=32 RESISTANT Resistant     CEFAZOLIN >=64 RESISTANT Resistant     CEFTRIAXONE RESISTANT Resistant     CIPROFLOXACIN 0.5 SENSITIVE Sensitive      GENTAMICIN >=16 RESISTANT Resistant  IMIPENEM <=0.25 SENSITIVE Sensitive     NITROFURANTOIN 64 INTERMEDIATE Intermediate     TRIMETH/SULFA >=320 RESISTANT Resistant     AMPICILLIN/SULBACTAM 8 SENSITIVE Sensitive     PIP/TAZO <=4 SENSITIVE Sensitive     Extended ESBL POSITIVE Resistant     * >=100,000 COLONIES/mL KLEBSIELLA PNEUMONIAE  Culture, blood (routine x 2)     Status: None (Preliminary result)   Collection Time: 06/20/18  1:36 PM  Result Value Ref Range Status   Specimen Description BLOOD RIGHT ANTECUBITAL  Final   Special Requests AEROBIC BOTTLE ONLY Blood Culture adequate volume  Final   Culture   Final    NO GROWTH 3 DAYS Performed at [facility], [place] [address] [place], [place] [address]    Report Status PENDING  Incomplete  Culture, blood (routine x 2)     Status: None (Preliminary result)   Collection Time: 06/20/18  1:36 PM  Result Value Ref Range Status   Specimen Description BLOOD RIGHT ANTECUBITAL  Final   Special Requests AEROBIC BOTTLE ONLY Blood Culture adequate volume  Final   Culture   Final    NO GROWTH 3 DAYS Performed at [facility], [place] [address], [place], [place] [address]    Report Status PENDING  Incomplete     Labs: BNP (last 3 results) Recent Labs    04/21/18 2209  BNP 400.8*   Basic Metabolic Panel: Recent Labs  Lab 06/20/18 1640 06/21/18 0839 06/22/18 0628 06/23/18 0456 06/24/18 0502  NA 134* 134* 135 132* 134*  K 3.0* 3.1* 3.6 3.5 3.8  CL 98 98 99 97* 101  CO2 24 27 24 23 22   GLUCOSE 240* 240* 245* 201* 201*  BUN 32* 41* 27* 37* 24*  CREATININE 5.00* 5.91* 4.24* 5.59* 4.20*  CALCIUM 7.6* 7.7* 7.8* 7.7* 7.7*  MG  --   --  1.9 2.0 1.9  PHOS 2.8 2.9  --   --   --    Liver Function Tests: Recent Labs  Lab 06/18/18 1319 06/19/18 0504 06/20/18 1640 06/21/18 0839  AST 14* 11*  --   --   ALT 11 10  --   --   ALKPHOS 112 100  --   --   BILITOT 0.7 0.8  --   --   PROT 6.2* 5.5*  --   --   ALBUMIN 2.1* 1.9*  2.0* 1.8*   Recent Labs  Lab 06/18/18 1334  LIPASE 12   No results for input(s): AMMONIA in the last 168 hours. CBC: Recent Labs  Lab 06/18/18 1319  06/20/18 0427 06/20/18 1640 06/21/18 0839 06/22/18 0628 06/23/18 0456  WBC 12.5*   < > 6.3 6.2 6.1 6.8 7.0  NEUTROABS 10.8*  --   --   --   --   --   --   HGB 9.1*   < > 7.6* 8.5* 7.8* 8.0* 7.5*  HCT 28.2*   < > 23.8* 26.4* 24.7* 24.8* 22.9*  MCV 93.7   < > 92.2 93.0 92.2 92.9 92.0  PLT 143*   < > 110* 120* PLATELET CLUMPS NOTED ON SMEAR, UNABLE TO ESTIMATE 146* 185   < > = values in this interval not displayed.   Cardiac Enzymes: No results for input(s): CKTOTAL, CKMB, CKMBINDEX, TROPONINI in the last 168 hours. BNP: Invalid input(s): POCBNP CBG: Recent Labs  Lab 06/23/18 0610 06/23/18 1114 06/23/18 1648 06/23/18 2058 06/24/18 0645  GLUCAP 181* 247* 140* 231* 221*   D-Dimer No  results for input(s): DDIMER in the last 72 hours. Hgb A1c No results for input(s): HGBA1C in the last 72 hours. Lipid Profile No results for input(s): CHOL, HDL, LDLCALC, TRIG, CHOLHDL, LDLDIRECT in the last 72 hours. Thyroid function studies No results for input(s): TSH, T4TOTAL, T3FREE, THYROIDAB in the last 72 hours.  Invalid input(s): FREET3 Anemia work up No results for input(s): VITAMINB12, FOLATE, FERRITIN, TIBC, IRON, RETICCTPCT in the last 72 hours. Urinalysis    Component Value Date/Time   COLORURINE RED (A) 06/18/2018 2258   APPEARANCEUR TURBID (A) 06/18/2018 2258   LABSPEC  06/18/2018 2258    TEST NOT REPORTED DUE TO COLOR INTERFERENCE OF URINE PIGMENT   PHURINE  06/18/2018 2258    TEST NOT REPORTED DUE TO COLOR INTERFERENCE OF URINE PIGMENT   GLUCOSEU (A) 06/18/2018 2258    TEST NOT REPORTED DUE TO COLOR INTERFERENCE OF URINE PIGMENT   HGBUR (A) 06/18/2018 2258    TEST NOT REPORTED DUE TO COLOR INTERFERENCE OF URINE PIGMENT   BILIRUBINUR (A) 06/18/2018 2258    TEST NOT REPORTED DUE TO COLOR INTERFERENCE OF URINE  PIGMENT   KETONESUR (A) 06/18/2018 2258    TEST NOT REPORTED DUE TO COLOR INTERFERENCE OF URINE PIGMENT   PROTEINUR (A) 06/18/2018 2258    TEST NOT REPORTED DUE TO COLOR INTERFERENCE OF URINE PIGMENT   NITRITE (A) 06/18/2018 2258    TEST NOT REPORTED DUE TO COLOR INTERFERENCE OF URINE PIGMENT   LEUKOCYTESUR (A) 06/18/2018 2258    TEST NOT REPORTED DUE TO COLOR INTERFERENCE OF URINE PIGMENT   Sepsis Labs Invalid input(s): PROCALCITONIN,  WBC,  LACTICIDVEN Microbiology Recent Results (from the past 240 hour(s))  Culture, blood (Routine x 2)     Status: Abnormal   Collection Time: 06/18/18  1:10 PM  Result Value Ref Range Status   Specimen Description BLOOD LEFT ANTECUBITAL  Final   Special Requests   Final    BOTTLES DRAWN AEROBIC AND ANAEROBIC Blood Culture results may not be optimal due to an excessive volume of blood received in culture bottles   Culture  Setup Time   Final    GRAM NEGATIVE RODS IN BOTH AEROBIC AND ANAEROBIC BOTTLES CRITICAL VALUE NOTED.  VALUE IS CONSISTENT WITH PREVIOUSLY REPORTED AND CALLED VALUE.    Culture (A)  Final    KLEBSIELLA PNEUMONIAE SUSCEPTIBILITIES PERFORMED ON PREVIOUS CULTURE WITHIN THE LAST 5 DAYS. Performed at [facility], [place] [address] [place], [place] [address]    Report Status 06/21/2018 FINAL  Final  Culture, blood (Routine x 2)     Status: Abnormal   Collection Time: 06/18/18  1:35 PM  Result Value Ref Range Status   Specimen Description BLOOD LEFT HAND  Final   Special Requests   Final    BOTTLES DRAWN AEROBIC AND ANAEROBIC Blood Culture adequate volume   Culture  Setup Time   Final    GRAM NEGATIVE RODS IN BOTH AEROBIC AND ANAEROBIC BOTTLES CRITICAL RESULT CALLED TO, READ BACK BY AND VERIFIED WITH: SINCLAIRE PHARMD AT [id number] PP [id number] BY SJW Performed at [facility], [place] [address] [place], [place] [address]    Culture (A)  Final    KLEBSIELLA PNEUMONIAE Confirmed Extended Spectrum Beta-Lactamase Producer (ESBL).   In bloodstream infections from ESBL organisms, carbapenems are preferred over piperacillin/tazobactam. They are shown to have a lower risk of mortality.    Report Status 06/21/2018 FINAL  Final   Organism ID, Bacteria KLEBSIELLA PNEUMONIAE  Final  Susceptibility   Klebsiella pneumoniae - MIC*    AMPICILLIN >=32 RESISTANT Resistant     CEFAZOLIN RESISTANT Resistant     CEFEPIME RESISTANT Resistant     CEFTAZIDIME RESISTANT Resistant     CEFTRIAXONE RESISTANT Resistant     CIPROFLOXACIN 0.5 SENSITIVE Sensitive     GENTAMICIN 8 INTERMEDIATE Intermediate     IMIPENEM <=0.25 SENSITIVE Sensitive     TRIMETH/SULFA >=320 RESISTANT Resistant     AMPICILLIN/SULBACTAM 8 SENSITIVE Sensitive     PIP/TAZO <=4 SENSITIVE Sensitive     Extended ESBL POSITIVE Resistant     * KLEBSIELLA PNEUMONIAE  Blood Culture ID Panel (Reflexed)     Status: Abnormal   Collection Time: 06/18/18  1:35 PM  Result Value Ref Range Status   Enterococcus species NOT DETECTED NOT DETECTED Final   Listeria monocytogenes NOT DETECTED NOT DETECTED Final   Staphylococcus species NOT DETECTED NOT DETECTED Final   Staphylococcus aureus (BCID) NOT DETECTED NOT DETECTED Final   Streptococcus species NOT DETECTED NOT DETECTED Final   Streptococcus agalactiae NOT DETECTED NOT DETECTED Final   Streptococcus pneumoniae NOT DETECTED NOT DETECTED Final   Streptococcus pyogenes NOT DETECTED NOT DETECTED Final   Acinetobacter baumannii NOT DETECTED NOT DETECTED Final   Enterobacteriaceae species DETECTED (A) NOT DETECTED Final    Comment: Enterobacteriaceae represent a large family of gram-negative bacteria, not a single organism. CRITICAL RESULT CALLED TO, READ BACK BY AND VERIFIED WITH: SINCLAIRE PHARMD AT 0911 ON [id number] BY SJW    Enterobacter cloacae complex NOT DETECTED NOT DETECTED Final   Escherichia coli NOT DETECTED NOT DETECTED Final   Klebsiella oxytoca NOT DETECTED NOT DETECTED Final   Klebsiella pneumoniae DETECTED  (A) NOT DETECTED Final    Comment: CRITICAL RESULT CALLED TO, READ BACK BY AND VERIFIED WITH: SINCLAIRE PHARMD AT 0388 ON [id number] BY SJW    Proteus species NOT DETECTED NOT DETECTED Final   Serratia marcescens NOT DETECTED NOT DETECTED Final   Carbapenem resistance NOT DETECTED NOT DETECTED Final   Haemophilus influenzae NOT DETECTED NOT DETECTED Final   Neisseria meningitidis NOT DETECTED NOT DETECTED Final   Pseudomonas aeruginosa NOT DETECTED NOT DETECTED Final   Candida albicans NOT DETECTED NOT DETECTED Final   Candida glabrata NOT DETECTED NOT DETECTED Final   Candida krusei NOT DETECTED NOT DETECTED Final   Candida parapsilosis NOT DETECTED NOT DETECTED Final   Candida tropicalis NOT DETECTED NOT DETECTED Final    Comment: Performed at [facility], [place] [address], [place],  [address]  SARS Coronavirus 2 (CEPHEID- Performed in [place] [facility] lab), Hosp Order     Status: None   Collection Time: 06/18/18  1:58 PM  Result Value Ref Range Status   SARS Coronavirus 2 NEGATIVE NEGATIVE Final    Comment: (NOTE) If result is NEGATIVE SARS-CoV-2 target nucleic acids are NOT DETECTED. The SARS-CoV-2 RNA is generally detectable in upper and lower  respiratory specimens during the acute phase of infection. The lowest  concentration of SARS-CoV-2 viral copies this assay can detect is 250  copies / mL. A negative result does not preclude SARS-CoV-2 infection  and should not be used as the sole basis for treatment or other  patient management decisions.  A negative result may occur with  improper specimen collection / handling, submission of specimen other  than nasopharyngeal swab, presence of viral mutation(s) within the  areas targeted by this assay, and inadequate number of viral copies  (<250 copies / mL). A negative  result must be combined with clinical  observations, patient history, and epidemiological information. If result is POSITIVE SARS-CoV-2 target nucleic  acids are DETECTED. The SARS-CoV-2 RNA is generally detectable in upper and lower  respiratory specimens dur ing the acute phase of infection.  Positive  results are indicative of active infection with SARS-CoV-2.  Clinical  correlation with patient history and other diagnostic information is  necessary to determine patient infection status.  Positive results do  not rule out bacterial infection or co-infection with other viruses. If result is PRESUMPTIVE POSTIVE SARS-CoV-2 nucleic acids MAY BE PRESENT.   A presumptive positive result was obtained on the submitted specimen  and confirmed on repeat testing.  While 2019 novel coronavirus  (SARS-CoV-2) nucleic acids may be present in the submitted sample  additional confirmatory testing may be necessary for epidemiological  and / or clinical management purposes  to differentiate between  SARS-CoV-2 and other Sarbecovirus currently known to infect humans.  If clinically indicated additional testing with an alternate test  methodology [phone]) is advised. The SARS-CoV-2 RNA is generally  detectable in upper and lower respiratory sp ecimens during the acute  phase of infection. The expected result is Negative. Fact Sheet for Patients:  StrictlyIdeas.no Fact Sheet for Healthcare Providers: BankingDealers.co.za This test is not yet approved or cleared by the [place] FDA and has been authorized for detection and/or diagnosis of SARS-CoV-2 by FDA under an Emergency Use Authorization (EUA).  This EUA will remain in effect (meaning this test can be used) for the duration of the COVID-19 declaration under Section 564(b)(1) of the Act, 21 U.S.C. section 360bbb-3(b)(1), unless the authorization is terminated or revoked sooner. Performed at [facility], [place] [address], [place], [place] [address]   Culture, Urine     Status: Abnormal   Collection Time: 06/18/18 10:45 PM  Result Value Ref  Range Status   Specimen Description URINE, CLEAN CATCH  Final   Special Requests   Final    URINE, CLEAN CATCH Performed at [facility], [place] [address], [place],  [address]    Culture (A)  Final    >=100,000 COLONIES/mL KLEBSIELLA PNEUMONIAE Confirmed Extended Spectrum Beta-Lactamase Producer (ESBL).  In bloodstream infections from ESBL organisms, carbapenems are preferred over piperacillin/tazobactam. They are shown to have a lower risk of mortality.    Report Status 06/20/2018 FINAL  Final   Organism ID, Bacteria KLEBSIELLA PNEUMONIAE (A)  Final      Susceptibility   Klebsiella pneumoniae - MIC*    AMPICILLIN >=32 RESISTANT Resistant     CEFAZOLIN >=64 RESISTANT Resistant     CEFTRIAXONE RESISTANT Resistant     CIPROFLOXACIN 0.5 SENSITIVE Sensitive     GENTAMICIN >=16 RESISTANT Resistant     IMIPENEM <=0.25 SENSITIVE Sensitive     NITROFURANTOIN 64 INTERMEDIATE Intermediate     TRIMETH/SULFA >=320 RESISTANT Resistant     AMPICILLIN/SULBACTAM 8 SENSITIVE Sensitive     PIP/TAZO <=4 SENSITIVE Sensitive     Extended ESBL POSITIVE Resistant     * >=100,000 COLONIES/mL KLEBSIELLA PNEUMONIAE  Culture, blood (routine x 2)     Status: None (Preliminary result)   Collection Time: 06/20/18  1:36 PM  Result Value Ref Range Status   Specimen Description BLOOD RIGHT ANTECUBITAL  Final   Special Requests AEROBIC BOTTLE ONLY Blood Culture adequate volume  Final   Culture   Final    NO GROWTH 3 DAYS Performed at [facility], [place],  [place] [address]    Report Status PENDING  Incomplete  Culture, blood (routine x 2)     Status: None (Preliminary result)   Collection Time: 06/20/18  1:36 PM  Result Value Ref Range Status   Specimen Description BLOOD RIGHT ANTECUBITAL  Final   Special Requests AEROBIC BOTTLE ONLY Blood Culture adequate volume  Final   Culture   Final    NO GROWTH 3 DAYS Performed at [facility], [place] [address], [place],  [place] [address]    Report Status PENDING  Incomplete     Time coordinating discharge:  I have spent 35 minutes face to face with the patient and on the ward discussing the patients care, assessment, plan and disposition with other care givers. >50% of the time was devoted counseling the patient about the risks and benefits of treatment/Discharge disposition and coordinating care.   SIGNED:   Damita Lack, MD  [facility] 06/24/2018, 8:12 AM   If 7PM-7AM, please contact night-coverage [email or web address]

## 2018-06-25 DIAGNOSIS — B961 Klebsiella pneumoniae [K. pneumoniae] as the cause of diseases classified elsewhere: Secondary | ICD-10-CM

## 2018-06-25 HISTORY — DX: Klebsiella pneumoniae (k. pneumoniae) as the cause of diseases classified elsewhere: B96.1

## 2018-06-25 LAB — RENAL FUNCTION PANEL
Albumin: 1.8 g/dL — ABNORMAL LOW (ref 3.5–5.0)
Anion gap: 11 (ref 5–15)
BUN: 38 mg/dL — ABNORMAL HIGH (ref 6–20)
CO2: 22 mmol/L (ref 22–32)
Calcium: 8 mg/dL — ABNORMAL LOW (ref 8.9–10.3)
Chloride: 101 mmol/L (ref 98–111)
Creatinine, Ser: 5.9 mg/dL — ABNORMAL HIGH (ref 0.61–1.24)
GFR calc Af Amer: 12 mL/min — ABNORMAL LOW (ref >60)
GFR calc non Af Amer: 10 mL/min — ABNORMAL LOW (ref >60)
Glucose, Bld: 322 mg/dL — ABNORMAL HIGH (ref 70–99)
Phosphorus: 4.4 mg/dL (ref 2.5–4.6)
Potassium: 3.8 mmol/L (ref 3.5–5.1)
Sodium: 134 mmol/L — ABNORMAL LOW (ref 135–145)

## 2018-06-25 LAB — CBC
HCT: 25.2 % — ABNORMAL LOW (ref 39.0–52.0)
Hemoglobin: 7.8 g/dL — ABNORMAL LOW (ref 13.0–17.0)
MCH: 29.5 pg (ref 26.0–34.0)
MCHC: 31 g/dL (ref 30.0–36.0)
MCV: 95.5 fL (ref 80.0–100.0)
Platelets: 277 10*3/uL (ref 150–400)
RBC: 2.64 MIL/uL — ABNORMAL LOW (ref 4.22–5.81)
RDW: 16.5 % — ABNORMAL HIGH (ref 11.5–15.5)
WBC: 6.3 10*3/uL (ref 4.0–10.5)
nRBC: 0 % (ref 0.0–0.2)

## 2018-06-25 LAB — CULTURE, BLOOD (ROUTINE X 2)
Culture: NO GROWTH
Culture: NO GROWTH
Special Requests: ADEQUATE
Special Requests: ADEQUATE

## 2018-06-25 LAB — MAGNESIUM: Magnesium: 2.1 mg/dL (ref 1.7–2.4)

## 2018-06-25 MED ORDER — HEPARIN SODIUM (PORCINE) 1000 UNIT/ML IJ SOLN
INTRAMUSCULAR | Status: AC
  Filled 2018-06-25: qty 4

## 2018-06-25 MED ORDER — HEPARIN SODIUM (PORCINE) 1000 UNIT/ML DIALYSIS: 20.0000 [IU]/kg | INTRAMUSCULAR | Status: DC | PRN

## 2018-06-25 MED ORDER — PHENOBARBITAL 32.4 MG PO TABS
226.8000 mg | ORAL_TABLET | Freq: Once | ORAL | Status: AC
  Administered 2018-06-25: 226.8 mg via ORAL
  Filled 2018-06-25 (×2): qty 7

## 2018-06-25 NOTE — Progress Notes (Signed)
[facility] Progress Note   Subjective: Seen in room -eating all of breakfast - no complaints.  He doesn't know d/c plan  Objective Vitals:   06/24/18 0921 06/24/18 1647 06/24/18 2113 06/25/18 0433  BP: 136/78 (!) 141/80 126/79 (!) 149/87  Pulse: 75 80 84 72  Resp: 18 18 16 17   Temp: 98.6 F (37 C) 98.4 F (36.9 C) 98.8 F (37.1 C) 97.8 F (36.6 C)  TempSrc: Oral Oral Oral   SpO2: 98% 97% 97% 98%  Weight:   109.2 kg   Height:       Physical Exam General:Overweight man, NAD. + seborrheic dermatitis. Heart:RRR; no murmur Lungs:CTA anteriorly Abdomen:soft, non-tender Extremities:No LE edema; scaly feet/legs ichthyosis, skin lax Dialysis Access:right IJ [facility] exit clean  Additional Objective Labs: Basic Metabolic Panel: Recent Labs  Lab 06/20/18 1640 06/21/18 0839 06/22/18 0628 06/23/18 0456 06/24/18 0502  NA 134* 134* 135 132* 134*  K 3.0* 3.1* 3.6 3.5 3.8  CL 98 98 99 97* 101  CO2 24 27 24 23 22   GLUCOSE 240* 240* 245* 201* 201*  BUN 32* 41* 27* 37* 24*  CREATININE 5.00* 5.91* 4.24* 5.59* 4.20*  CALCIUM 7.6* 7.7* 7.8* 7.7* 7.7*  PHOS 2.8 2.9  --   --   --    Liver Function Tests: Recent Labs  Lab 06/18/18 1319 06/19/18 0504 06/20/18 1640 06/21/18 0839  AST 14* 11*  --   --   ALT 11 10  --   --   ALKPHOS 112 100  --   --   BILITOT 0.7 0.8  --   --   PROT 6.2* 5.5*  --   --   ALBUMIN 2.1* 1.9* 2.0* 1.8*   Recent Labs  Lab 06/18/18 1334  LIPASE 12   CBC: Recent Labs  Lab 06/18/18 1319  06/20/18 0427 06/20/18 1640 06/21/18 0839 06/22/18 0628 06/23/18 0456  WBC 12.5*   < > 6.3 6.2 6.1 6.8 7.0  NEUTROABS 10.8*  --   --   --   --   --   --   HGB 9.1*   < > 7.6* 8.5* 7.8* 8.0* 7.5*  HCT 28.2*   < > 23.8* 26.4* 24.7* 24.8* 22.9*  MCV 93.7   < > 92.2 93.0 92.2 92.9 92.0  PLT 143*   < > 110* 120* PLATELET CLUMPS NOTED ON SMEAR, UNABLE TO ESTIMATE 146* 185   < > = values in this interval not displayed.   Blood Culture     Component Value Date/Time   SDES BLOOD RIGHT ANTECUBITAL 06/20/2018 1336   SDES BLOOD RIGHT ANTECUBITAL 06/20/2018 1336   SPECREQUEST AEROBIC BOTTLE ONLY Blood Culture adequate volume 06/20/2018 1336   SPECREQUEST AEROBIC BOTTLE ONLY Blood Culture adequate volume 06/20/2018 1336   CULT  06/20/2018 1336    NO GROWTH 5 DAYS Performed at [facility], [place] [address], [place], [place] [address]    CULT  06/20/2018 1336    NO GROWTH 5 DAYS Performed at [facility], [place] [address], [place],  [address]    REPTSTATUS 06/25/2018 FINAL 06/20/2018 1336   REPTSTATUS 06/25/2018 FINAL 06/20/2018 1336    CBG: Recent Labs  Lab 06/23/18 2058 06/24/18 0645 06/24/18 1206 06/24/18 1652 06/24/18 2114  GLUCAP 231* 221* 174* 166* 154*   Medications: . meropenem (MERREM) IV 500 mg (06/24/18 1747)   . calcium acetate  667 mg Oral TID WC  . carbamazepine  700 mg Oral BID  .  Chlorhexidine Gluconate Cloth  6 each Topical Q0600  . cholecalciferol  2,000 Units Oral Daily  . darbepoetin (ARANESP) injection - DIALYSIS  200 mcg Intravenous Q Thu-HD  . feeding supplement (NEPRO CARB STEADY)  237 mL Oral BID BM  . feeding supplement (PRO-STAT SUGAR FREE 64)  30 mL Oral BID  . ferrous sulfate  325 mg Oral Q breakfast  . heparin  5,000 Units Subcutaneous Q8H  . insulin aspart  0-5 Units Subcutaneous QHS  . insulin aspart  0-9 Units Subcutaneous TID WC  . insulin aspart  6 Units Subcutaneous TID WC  . insulin glargine  15 Units Subcutaneous BID  . lactulose  10 g Oral Q T,Th,S,Su  . lactulose  20 g Oral Q M,W,F  . lipase/protease/amylase  12,000 Units Oral TID WC  . multivitamin  1 tablet Oral QHS  . PHENobarbital  226.8 mg Oral QHS  . simvastatin  20 mg Oral QHS  . tamsulosin  0.4 mg Oral QHS    Dialysis Orders: AKI/ [facility] MWF 3.5h 400/800 114.5kg leaving below 3K/2.5Ca bath RIJ TDC Hep 3000 Venofer through 6/5  [facility] 100 last 5/15  no VDRA  Labs: hgb 9 18%  sat Ca/P slight ^ -recently started on Ca acetate 1 ac iPTH ok Glu high 300 - 500s Cr 5.37 5/13, Cr 4.6 5/18  Assessment/Plan: 1. Pyelonephritis/Klebsiella bacteremia: BCxand UCx5/25 growing Klebsiella. Repeat BCx 5/27negative. To get 7d IV Meropenem, then 7d PO abx. Return to SNF once done w/ IV abx.Repeat [facility] 5/27 negative  2. AKI: On HDsince 4/30. Off schedule last week, will return to prior MWF schedule starting 6/1. No signs of any recovery at this point.Plan HD today - check labs pre HD today  3. Hypertension/volume: BP controlled - trying to avoid hypotension. 4. Anemia: Hgb7.5, holdingvenofer in hospital d/t bacteremia. Surveillance BC negative - resume at d/c  -  Continue Aranesp 200 q Thurs.- d/c oral Fe 5. Metabolic bone disease: Corr Ca/Phos ok.No binders or VDRA needed 6. Nutrition: Alb very low. Hxcirrhosis plusmultiple hospitalizations. Continue Nepro/Pro-stat. 7. DM: LastA1c 8.3% in 03/2018.  8. Cirrhosis:On lactulose. 9. Hx cognitive delay  10. Hx debility:SNF dependent x about 1 year   Amalia Hailey, [name] 06/25/2018, 8:22 AM  [facility] Pager: [phone]

## 2018-06-25 NOTE — Progress Notes (Signed)
DISCHARGE NOTE SNF [facility] to be discharged Skilled nursing facility per MD order. Patient verbalized understanding.   IV catheter discontinued intact. Site without signs and symptoms of complications. Dressing and pressure applied. Pt denies pain at the site currently. No complaints noted.  Patient free of lines, drains, and wounds.   Discharge packet assembled. An After Visit Summary (AVS) was printed and given to the EMS personnel. Patient escorted via stretcher and discharged to [facility] via ambulance. Report called to accepting facility; all questions and concerns addressed.   Aneta Mins BSN, RN3

## 2018-06-25 NOTE — Progress Notes (Signed)
Reesa Chew, MD notified regarding patients phenobarbital prescription. MD stated that he would not be able to renew the prescription and that the primary, MD at [facility] would have to address this matter. MD gave order to give bedtime dose of phenobarbital prior to patients discharge. Orders followed and Otila Kluver, RN notified at [facility]. Will continue to monitor.

## 2018-06-25 NOTE — Progress Notes (Signed)
Renal Navigator arranged for patient to have a later OP HD seat time after discharge from the hospital today, however, per CSW/C. [facility] is unable to transport patient to dialysis today (see CSW note from 06/25/18) and he will require inpt HD prior to discharge.  Renal Navigator notified Nephrologist/Dr. Posey Pronto and OP HD clinic/[facility]. Renal Navigator appreciates CSW's collaboration with Renal Navigator today in trying to arrange OP HD for patient after discharge.  Alphonzo Cruise, [place] Renal Navigator [phone]

## 2018-06-25 NOTE — TOC Progression Note (Signed)
Transition of Care [facility]) - Progression Note    Patient Details  Name: Eddie Davies MRN: [id number] Date of Birth: [birth year]  Transition of Care [facility]) CM/SW [place], [place] Phone Number: 06/25/2018, 10:49 AM  Clinical Narrative:     CSW called and spoke with Jaclyn Shaggy, renal navigator CSW. CSW explained that the patient ready for discharge and he has an appointment to dialyze at 11:30am. She stated that she would call the center to see if they can get an appointment at a later time. Jaclyn Shaggy called the CSW back and stated that if they could get the patient there by 12:00 he would be able to dialyze. CSW stated that she would call the facility to see if they would be able to transport him.   CSW called [facility] to see if they would be able to get the patient to his dialysis appointment by noon. Margarita Grizzle stated that the facilitie's Lucianne Lei driver was off today and they have to bring a 2nd party transportation service to transport the patients to dialysis. She stated that they would not be able to take the patient and bring him back. She asked if the patient could dialyze at the hospital before discharge. CSW stated that she would check with the MD to make sure that was okay.   CSW called and spoke with [facility] and Dr. Reesa Chew. Jaclyn Shaggy stated that she would speak with Dr. Posey Pronto and inform him that the patient would need to dialyze. CSW called and spoke with Dr. Reesa Chew. He stated that it was fine if the patient had to dialyze. He stated that would make any changes to the patient's discharge if they were needed.   CSW informed Margarita Grizzle with [facility]. The patient will discharge once he has completed dialysis.   Domenic Schwab, MSW, LCSW-A Clinical Social Worker [place] [facility]    Expected Discharge Plan: Skilled Nursing Facility Barriers to Discharge: No Barriers Identified  Expected Discharge Plan and Services Expected Discharge Plan: [place] In-house  Referral: NA Discharge Planning Services: NA Post Acute Care Choice: Independence Living arrangements for the past 2 months: [place] Expected Discharge Date: 06/24/18               DME Arranged: N/A DME Agency: NA       HH Arranged: NA HH Agency: NA         Social Determinants of Health (SDOH) Interventions    Readmission Risk Interventions Readmission Risk Prevention Plan 05/28/2018 05/23/2018 04/27/2018  Transportation Screening - Complete Complete  PCP or Specialist Appt within 5-7 Days - - Complete  Home Care Screening - - Complete  Medication Review (RN CM) - - Complete  Medication Review (RN Transport planner) - Complete -  [place] or Home Care Consult Complete (No Data) -  SW Recovery Care/Counseling Consult Complete - -  Palliative Care Screening - Not Applicable -  Skilled Nursing Facility - Complete -  Some recent data might be hidden

## 2018-06-25 NOTE — Progress Notes (Signed)
Report called to Otila Kluver, Therapist, sports at [facility]. RN asked for refill prescription on patients phenobarbital. Reesa Chew, MD notified.

## 2018-06-25 NOTE — TOC Transition Note (Signed)
Transition of Care [facility]) - CM/SW Discharge Note   Patient Details  Name: Eddie Davies MRN: [id number] Date of Birth: 1970-07-19  Transition of Care [facility]) CM/SW Contact:  Gelene Mink, [place] Phone Number: 06/25/2018, 11:15 AM   Clinical Narrative:     Patient will DC to: [facility] Anticipated DC date: 06/25/2018 Family notified: Yes Transport by: Corey Harold   Per MD patient ready for DC to . RN, patient, patient's family, and facility notified of DC. Discharge Summary and FL2 sent to facility. RN to call report prior to discharge [phone]). The patient will go to room 202.  DC packet on chart. Ambulance transport requested for patient.   CSW will sign off for now as social work intervention is no longer needed. Please consult [place] again if new needs arise.  Zharia Conrow, LCSW-A [place]/Clinical Social Work Department Cell: [phone]     Final next level of care: [place] Barriers to Discharge: No Barriers Identified   Patient Goals and CMS Choice Patient states their goals for this hospitalization and ongoing recovery are:: Pt will return to [facility] facility CMS Medicare.gov Compare Post Acute Care list provided to:: Other (Comment Required)(pt will return back to his facility) Choice offered to / list presented to : NA  Discharge Placement   Existing PASRR number confirmed : 06/22/18          Patient chooses bed at: [facility] Patient to be transferred to facility by: [place] Name of family member notified: Earlie Lou, sister Patient and family notified of of transfer: 06/25/18  Discharge Plan and Services In-house Referral: NA Discharge Planning Services: NA Post Acute Care Choice: [place]          DME Arranged: N/A DME Agency: NA       HH Arranged: NA HH Agency: NA        Social Determinants of Health (SDOH) Interventions     Readmission Risk Interventions Readmission  Risk Prevention Plan 05/28/2018 05/23/2018 04/27/2018  Transportation Screening - Complete Complete  PCP or Specialist Appt within 5-7 Days - - Complete  Home Care Screening - - Complete  Medication Review (RN CM) - - Complete  Medication Review (RN Care Manager) - Complete -  [place] or Home Care Consult Complete (No Data) -  SW Recovery Care/Counseling Consult Complete - -  Palliative Care Screening - Not Applicable -  [place] - Complete -  Some recent data might be hidden

## 2018-06-25 NOTE — Procedures (Signed)
Patient seen on Hemodialysis. BP 137/86   Pulse 79   Temp 97.8 F (36.6 C)   Resp 17   Ht 6' 2"  (1.88 m)   Wt (P) 109.3 kg   SpO2 98%   BMI (P) 30.94 kg/m   QB 400, UF goal 1.5L Tolerating treatment without complaints at this time.   Elmarie Shiley MD [facility]. Office # [phone] Pager # [phone] 1:54 PM

## 2018-06-25 NOTE — Progress Notes (Signed)
Physical Therapy Treatment Patient Details Name: Eddie Davies MRN: [id number] DOB: 09-11-1970 Today's Date: 06/25/2018    History of Present Illness Pt is a 48 y.o. male resident at [facility], admitted on 06/18/18 with fever and abdominal pain. CT abdomen concerning for recurrent or ongoing infectious pyelonephritis/cytstisis. Of note, recent admission 4/25-05/31/18 wtih ARF subsequently started on HD. PMH includes ESRD (on HD since 05/24/18), chronic pancreatitis, DM2, liver cirrhosis, anemia, tobacco abuse.    PT Comments    Pt received in bed. Declining OOB due to awaiting transport to HD. Pt agreeable to LE exercises in bed. Transporter arrived during session to take pt to HD.   Follow Up Recommendations  SNF;Supervision/Assistance - 24 hour     Equipment Recommendations  None recommended by PT    Recommendations for Other Services       Precautions / Restrictions Precautions Precautions: Fall;Other (comment) Precaution Comments: incontinent of stool    Mobility  Bed Mobility Overal bed mobility: Needs Assistance Bed Mobility: Rolling Rolling: Min assist            Transfers                    Ambulation/Gait                 Stairs             Wheelchair Mobility    Modified Rankin (Stroke Patients Only)       Balance                                            Cognition Arousal/Alertness: Awake/alert Behavior During Therapy: WFL for tasks assessed/performed Overall Cognitive Status: No family/caregiver present to determine baseline cognitive functioning                                        Exercises General Exercises - Lower Extremity Ankle Circles/Pumps: AROM;20 reps;Both Heel Slides: AROM;20 reps;Right;Left Hip ABduction/ADduction: AROM;Both;20 reps Other Exercises Other Exercises: bridging x 5    General Comments        Pertinent Vitals/Pain Pain Assessment:  Faces Faces Pain Scale: Hurts little more Pain Location: low back Pain Descriptors / Indicators: Sore Pain Intervention(s): Monitored during session    Home Living                      Prior Function            PT Goals (current goals can now be found in the care plan section) Acute Rehab PT Goals Patient Stated Goal: get some sleep PT Goal Formulation: With patient Time For Goal Achievement: 07/03/18 Potential to Achieve Goals: Fair Progress towards PT goals: Progressing toward goals    Frequency    Min 2X/week      PT Plan Current plan remains appropriate    Co-evaluation              AM-PAC PT "6 Clicks" Mobility   Outcome Measure  Help needed turning from your back to your side while in a flat bed without using bedrails?: A Little Help needed moving from lying on your back to sitting on the side of a flat bed without using bedrails?: A Little Help needed moving to and from a  bed to a chair (including a wheelchair)?: A Lot Help needed standing up from a chair using your arms (e.g., wheelchair or bedside chair)?: A Lot Help needed to walk in hospital room?: A Lot Help needed climbing 3-5 steps with a railing? : Total 6 Click Score: 13    End of Session   Activity Tolerance: Patient tolerated treatment well Patient left: in bed;Other (comment)(transporting to HD) Nurse Communication: Mobility status PT Visit Diagnosis: Other abnormalities of gait and mobility (R26.89);Muscle weakness (generalized) (M62.81)     Time: 4540-9811 PT Time Calculation (min) (ACUTE ONLY): 11 min  Charges:  $Therapeutic Exercise: 8-22 mins                     Lorrin Goodell, PT  Office # [phone] Pager [phone]    Lorriane Shire 06/25/2018, 10:36 AM

## 2018-06-25 NOTE — Progress Notes (Signed)
Patient seen and examined at the bedside.  Unable to return yesterday as his facility was not able to review his case to accept him for admission.  No acute events overnight.  Today patient can go with prior to that case manager working to coordinate his outpatient dialysis to ensure he makes it on time if not he will have to get dialyzed inpatient before getting back to [facility].  Discharge paperwork completed 5/31.  No change since then.  Call with further questions as needed.  Gerlean Ren MD [facility]

## 2018-06-26 LAB — GLUCOSE, CAPILLARY: Glucose-Capillary: 186 mg/dL — ABNORMAL HIGH (ref 70–99)

## 2018-07-09 DIAGNOSIS — R52 Pain, unspecified: Secondary | ICD-10-CM

## 2018-07-09 HISTORY — DX: Pain, unspecified: R52

## 2018-08-09 DIAGNOSIS — A0472 Enterocolitis due to Clostridium difficile, not specified as recurrent: Secondary | ICD-10-CM

## 2018-08-09 HISTORY — DX: Enterocolitis due to Clostridium difficile, not specified as recurrent: A04.72

## 2018-08-28 DIAGNOSIS — D631 Anemia in chronic kidney disease: Secondary | ICD-10-CM

## 2018-08-28 DIAGNOSIS — N2581 Secondary hyperparathyroidism of renal origin: Secondary | ICD-10-CM | POA: Insufficient documentation

## 2018-08-28 DIAGNOSIS — N189 Chronic kidney disease, unspecified: Secondary | ICD-10-CM

## 2018-08-28 HISTORY — DX: Anemia in chronic kidney disease: N18.9

## 2018-08-28 HISTORY — DX: Secondary hyperparathyroidism of renal origin: N25.81

## 2018-09-12 DIAGNOSIS — Z23 Encounter for immunization: Secondary | ICD-10-CM | POA: Insufficient documentation

## 2018-10-04 DIAGNOSIS — D631 Anemia in chronic kidney disease: Secondary | ICD-10-CM

## 2018-10-04 DIAGNOSIS — N186 End stage renal disease: Secondary | ICD-10-CM

## 2018-10-16 ENCOUNTER — Other Ambulatory Visit: Payer: Self-pay

## 2018-10-16 ENCOUNTER — Ambulatory Visit (INDEPENDENT_AMBULATORY_CARE_PROVIDER_SITE_OTHER)
Admission: RE | Admit: 2018-10-16 | Discharge: 2018-10-16 | Disposition: A | Discharge status: Home / Self Care | Payer: Medicare Other | Source: Ambulatory Visit | Attending: Family | Admitting: Family

## 2018-10-16 ENCOUNTER — Other Ambulatory Visit: Payer: Self-pay | Admitting: *Deleted

## 2018-10-16 ENCOUNTER — Ambulatory Visit (INDEPENDENT_AMBULATORY_CARE_PROVIDER_SITE_OTHER): Payer: Medicare Other | Admitting: Vascular Surgery

## 2018-10-16 ENCOUNTER — Ambulatory Visit (HOSPITAL_COMMUNITY)
Admission: RE | Admit: 2018-10-16 | Discharge: 2018-10-16 | Disposition: A | Discharge status: Home / Self Care | Payer: Medicare Other | Source: Ambulatory Visit | Attending: Family | Admitting: Family

## 2018-10-16 ENCOUNTER — Encounter: Payer: Self-pay | Admitting: Vascular Surgery

## 2018-10-16 ENCOUNTER — Encounter: Payer: Self-pay | Admitting: *Deleted

## 2018-10-16 VITALS — BP 119/80 | HR 74 | Temp 97.8°F | Resp 20 | Ht 74.0 in | Wt 238.0 lb

## 2018-10-16 DIAGNOSIS — N186 End stage renal disease: Secondary | ICD-10-CM

## 2018-10-16 DIAGNOSIS — Z992 Dependence on renal dialysis: Secondary | ICD-10-CM | POA: Insufficient documentation

## 2018-10-16 NOTE — Progress Notes (Signed)
Vascular and Vein Specialist of [place]  Patient name: Eddie Davies MRN: [id number] DOB: 08-25-1970 Sex: male  REASON FOR CONSULT: Discuss access for hemodialysis  HPI: Eddie Davies is a 48 y.o. male, who is here today for discussion of hemodialysis.  He has multitude of medical problems including end-stage renal disease.  Also has liver failure.  He is here today with his caregiver.  He is in a wheelchair.  He is able to answer questions.  He currently has hemodialysis on Monday Wednesday and Friday via a right IJ tunneled catheter which was placed by interventional radiology at [facility] on 05/30/2018.  He reports no difficulty with the catheter.  There is no history of infection.  He is left-handed.  He does not have a pacemaker.   Past Medical History:  Diagnosis Date  . Cirrhosis ([place]) 04/2018  . Diabetes mellitus without complication ([place])   . Hypertension   . Liver failure ([place])   . Metabolic acidosis 49/6759  . Renal failure     History reviewed. No pertinent family history.  SOCIAL HISTORY: Social History   Socioeconomic History  . Marital status: Single    Spouse name: Not on file  . Number of children: Not on file  . Years of education: Not on file  . Highest education level: Not on file  Occupational History  . Not on file  Social Needs  . Financial resource strain: Not on file  . Food insecurity    Worry: Not on file    Inability: Not on file  . Transportation needs    Medical: Not on file    Non-medical: Not on file  Tobacco Use  . Smoking status: Current Some Day Smoker    Packs/day: 0.25    Types: Cigarettes  . Smokeless tobacco: Never Used  Substance and Sexual Activity  . Alcohol use: Not Currently  . Drug use: Never  . Sexual activity: Not on file  Lifestyle  . Physical activity    Days per week: Not on file    Minutes per session: Not on file  . Stress: Not on file   Relationships  . Social Herbalist on phone: Not on file    Gets together: Not on file    Attends religious service: Not on file    Active member of club or organization: Not on file    Attends meetings of clubs or organizations: Not on file    Relationship status: Not on file  . Intimate partner violence    Fear of current or ex partner: Not on file    Emotionally abused: Not on file    Physically abused: Not on file    Forced sexual activity: Not on file  Other Topics Concern  . Not on file  Social History Narrative  . Not on file    Allergies  Allergen Reactions  . Fish Oil Swelling and Other (See Comments)         Current Outpatient Medications  Medication Sig Dispense Refill  . acetaminophen (TYLENOL) 325 MG tablet Take 650 mg by mouth every 8 (eight) hours as needed for fever (pain).    Marland Kitchen allopurinol (ZYLOPRIM) 100 MG tablet Take by mouth.    . Amino Acids-Protein Hydrolys (FEEDING SUPPLEMENT, PRO-STAT SUGAR FREE 64,) LIQD Take 30 mLs by mouth 2 (two) times a day.    . busPIRone (BUSPAR) 5 MG tablet     . calcium acetate (PHOSLO) 667  MG capsule Take 667 mg by mouth 3 (three) times daily with meals.    . carbamazepine (TEGRETOL) 100 MG chewable tablet Chew 100 mg by mouth 2 (two) times daily. Take with 600 mg for a total dose of 700 mg.    . carbamazepine (TEGRETOL) 200 MG tablet Take 600 mg by mouth 2 (two) times daily. Take with 142m for a total dose of 7063m    . Marland Kitchenholecalciferol (VITAMIN D) 50 MCG (2000 UT) tablet Take 2,000 Units by mouth daily.    . cloNIDine (CATAPRES) 0.1 MG tablet Take by mouth.    . Darbepoetin Alfa (ARANESP) 200 MCG/0.4ML SOSY injection Inject 0.4 mLs (200 mcg total) into the vein every Thursday with hemodialysis. 1.68 mL   . ENULOSE 10 GM/15ML SOLN     . ferrous sulfate 325 (65 FE) MG tablet Take 325 mg by mouth daily with breakfast.    . guaiFENesin (MUCINEX) 600 MG 12 hr tablet Take 600 mg by mouth every 12 (twelve) hours as  needed for cough (congestion).    . hydrocerin (EUCERIN) CREA Apply 1 application topically 2 (two) times daily. Apply to body  0  . insulin aspart (NOVOLOG) 100 UNIT/ML injection 0-15 Units, Subcutaneous, 3 times daily with meals CBG < 70: implement hypoglycemia protocol CBG 70 - 120: 0 units CBG 121 - 150: 2 units CBG 151 - 200: 3 units CBG 201 - 250: 5 units CBG 251 - 300: 8 units CBG 301 - 350: 11 units CBG 351 - 400: 15 units CBG > 400: call MD 10 mL 11  . Insulin Glargine (BASAGLAR KWIKPEN) 100 UNIT/ML SOPN Inject 0.13 mLs (13 Units total) into the skin at bedtime.    . insulin lispro (ADMELOG) 100 UNIT/ML injection Inject into the skin.    . Marland Kitchenactulose (CHRONULAC) 10 GM/15ML solution Take 15 mLs (10 g total) by mouth 2 (two) times daily. (Patient taking differently: Take 10-20 g by mouth See admin instructions. Give 10g twice daily on TUE, THU, SAT, SUN and 20g once daily on MON, WED, FRI.) 236 mL 0  . multivitamin (RENA-VIT) TABS tablet Take 1 tablet by mouth at bedtime.  0  . Nutritional Supplements (FEEDING SUPPLEMENT, NEPRO CARB STEADY,) LIQD Take 237 mLs by mouth 2 (two) times daily between meals.  0  . omeprazole (PRILOSEC OTC) 20 MG tablet Take 20 mg by mouth daily.     . Marland Kitchenmeprazole (PRILOSEC) 20 MG capsule     . ondansetron (ZOFRAN) 4 MG tablet     . PHENobarbital (LUMINAL) 32.4 MG tablet Take 7 tablets (226.8 mg total) by mouth at bedtime. 90 tablet 0  . RENVELA 800 MG tablet     . simvastatin (ZOCOR) 20 MG tablet Take 20 mg by mouth at bedtime.   3  . tamsulosin (FLOMAX) 0.4 MG CAPS capsule Take 0.4 mg by mouth at bedtime.    . TRADJENTA 5 MG TABS tablet      No current facility-administered medications for this visit.     REVIEW OF SYSTEMS:  [X]  denotes positive finding, [ ]  denotes negative finding Cardiac  Comments:  Chest pain or chest pressure:    Shortness of breath upon exertion:    Short of breath when lying flat:    Irregular heart rhythm:        Vascular     Pain in calf, thigh, or hip brought on by ambulation:    Pain in feet at night that wakes you up from your sleep:  Blood clot in your veins:    Leg swelling:         Pulmonary    Oxygen at home:    Productive cough:     Wheezing:         Neurologic    Sudden weakness in arms or legs:     Sudden numbness in arms or legs:     Sudden onset of difficulty speaking or slurred speech:    Temporary loss of vision in one eye:     Problems with dizziness:         Gastrointestinal    Blood in stool:     Vomited blood:         Genitourinary    Burning when urinating:     Blood in urine:        Psychiatric    Major depression:         Hematologic    Bleeding problems:    Problems with blood clotting too easily:        Skin    Rashes or ulcers:        Constitutional    Fever or chills:      PHYSICAL EXAM: Vitals:   10/16/18 1032  BP: 119/80  Pulse: 74  Resp: 20  Temp: 97.8 F (36.6 C)  SpO2: 100%  Weight: 238 lb (108 kg)  Height: 6' 2"  (1.88 m)    GENERAL: The patient is a well-nourished male, in no acute distress. The vital signs are documented above. CARDIOVASCULAR: 2+ radial pulses bilaterally.  Easily visible cephalic vein at the right wrist.  Not apparent at the left wrist. PULMONARY: There is good air exchange  ABDOMEN: Soft and non-tender  MUSCULOSKELETAL: There are no major deformities or cyanosis. NEUROLOGIC: No focal weakness or paresthesias are detected. SKIN: There are no ulcers or rashes noted. PSYCHIATRIC: The patient has a normal affect.  DATA:  Duplex of his upper extremity arteries and veins reveal very large cephalic vein from his right wrist proximally.  Small to moderate left forearm cephalic vein but better caliber from the antecubital space forward.  Normal triphasic flow in the radial arteries bilaterally  MEDICAL ISSUES: Discussed options with the patient regarding hemodialysis.  He does appear to be an excellent candidate for a  right arm fistula which fortunately is is non-dominant arm.  We will coordinate this at his earliest convenience on a Tuesday or Thursday nondialysis day.  He understands this will be done as an outpatient and will take a minimum of 3 months for maturation   Rosetta Posner, MD [facility] Vascular and Vein Specialists of [facility] Tel [phone] Pager [phone]

## 2018-10-22 ENCOUNTER — Encounter (HOSPITAL_COMMUNITY): Payer: Self-pay | Admitting: *Deleted

## 2018-10-22 DIAGNOSIS — N186 End stage renal disease: Secondary | ICD-10-CM | POA: Diagnosis not present

## 2018-10-22 DIAGNOSIS — F17200 Nicotine dependence, unspecified, uncomplicated: Secondary | ICD-10-CM | POA: Diagnosis not present

## 2018-10-22 DIAGNOSIS — I120 Hypertensive chronic kidney disease with stage 5 chronic kidney disease or end stage renal disease: Secondary | ICD-10-CM | POA: Diagnosis not present

## 2018-10-22 DIAGNOSIS — Z79899 Other long term (current) drug therapy: Secondary | ICD-10-CM | POA: Diagnosis not present

## 2018-10-22 DIAGNOSIS — R569 Unspecified convulsions: Secondary | ICD-10-CM | POA: Diagnosis not present

## 2018-10-22 DIAGNOSIS — Z794 Long term (current) use of insulin: Secondary | ICD-10-CM | POA: Diagnosis not present

## 2018-10-22 DIAGNOSIS — K7290 Hepatic failure, unspecified without coma: Secondary | ICD-10-CM | POA: Diagnosis not present

## 2018-10-22 DIAGNOSIS — Z20828 Contact with and (suspected) exposure to other viral communicable diseases: Secondary | ICD-10-CM | POA: Diagnosis not present

## 2018-10-22 DIAGNOSIS — K7460 Unspecified cirrhosis of liver: Secondary | ICD-10-CM | POA: Diagnosis not present

## 2018-10-22 DIAGNOSIS — E1122 Type 2 diabetes mellitus with diabetic chronic kidney disease: Secondary | ICD-10-CM | POA: Diagnosis not present

## 2018-10-22 DIAGNOSIS — Z992 Dependence on renal dialysis: Secondary | ICD-10-CM | POA: Diagnosis not present

## 2018-10-22 NOTE — Pre-Procedure Instructions (Signed)
    Eddie Davies  10/22/2018      Your procedure is scheduled on Tuesday September 29..  Report to [facility], Main Entrance or Entrance "A" at A.M.   Call this number if you have problems the morning of surgery: [phone]  This is the number for the Pre- Surgical Desk.  >>>>>Please send patient's Medication Record with medications administrated documentation. ( this information is required prior to OR. This includes medications that may have been on hold for surgery)<<<<<   Remember:  Do not eat or drink after midnight.  Tonight give 1/2 of Lantus Insulin=10units               Do not give Tradjenta  in am    Take these medicines the morning of surgery with A SIP OF WATER:  allopurinol (ZYLOPRIM)              carbamazepine (TEGRETOL)              omeprazole (PRILOSEC)  Give if needed: cloNIDine (CATAPRES)  acetaminophen (TYLENOL) guaiFENesin (MUCINEX)   .  o Check blood sugar the morning of  surgery when you wake up and every 2 hours until you get to the Short Stay unit. . If your blood sugar is less than 70 mg/dL, you will need to treat for low blood sugar: o Do not take insulin. o Treat a low blood sugar (less than 70 mg/dL) with  cup of clear juice (cranberry or apple), 4 glucose tablets, OR glucose gel. o  Recheck blood sugar in 15 minutes after treatment (to make sure it is greater than 70 mg/dL). If your blood sugar is not greater than 70 mg/dL on recheck, call [phone] o  for further instructions. . Report your blood sugar to the short stay nurse when you get to Short Stay. . If your CBG is greater than 220 mg/dL, you may take  of your sliding scale (correction) dose of insulin.              Shower, wear clean clothes, brush teeth  Do not wear jewelry, make-up or nail polish.  Do not wear lotions, powders, or perfumes, or deodorant.  Men may shave face and neck.  Do not bring valuables to the hospital.  [facility] is not responsible for  any belongings or valuables.  Contacts, dentures or bridgework may not be worn into surgery.  Leave your suitcase in the car.  After surgery it may be   Orders per [facility]

## 2018-10-22 NOTE — Progress Notes (Addendum)
I spoke to Mr.Luczak nurse, , Museum/gallery conservator. Amber reports that patient's CBG runs high at times, patient eats most anything he wants. Mr.Hoose smokes, I asked Amber to tell patient to not smoke anymore today or in a.m.  Medical transport is bring patient and will drive him back to [facility]. Amber checked with the transportation company and he will be able to drive patient through Covid testing at [facility] and have patient at [facility] by 1030.  I faxed instructions to [facility] and went over the instructions with Amber.

## 2018-10-23 ENCOUNTER — Ambulatory Visit (HOSPITAL_COMMUNITY)
Admission: RE | Admit: 2018-10-23 | Discharge: 2018-10-23 | Disposition: A | Discharge status: Home / Self Care | Payer: Medicare Other | Attending: Vascular Surgery | Admitting: Vascular Surgery

## 2018-10-23 ENCOUNTER — Other Ambulatory Visit: Payer: Self-pay

## 2018-10-23 ENCOUNTER — Encounter (HOSPITAL_COMMUNITY): Payer: Self-pay

## 2018-10-23 ENCOUNTER — Ambulatory Visit (HOSPITAL_COMMUNITY): Payer: Medicare Other | Admitting: Anesthesiology

## 2018-10-23 ENCOUNTER — Encounter (HOSPITAL_COMMUNITY)
Admission: RE | Disposition: A | Discharge status: Home / Self Care | Payer: Self-pay | Source: Home / Self Care | Attending: Vascular Surgery

## 2018-10-23 DIAGNOSIS — Z992 Dependence on renal dialysis: Secondary | ICD-10-CM | POA: Diagnosis not present

## 2018-10-23 DIAGNOSIS — Z794 Long term (current) use of insulin: Secondary | ICD-10-CM | POA: Insufficient documentation

## 2018-10-23 DIAGNOSIS — E1122 Type 2 diabetes mellitus with diabetic chronic kidney disease: Secondary | ICD-10-CM | POA: Insufficient documentation

## 2018-10-23 DIAGNOSIS — K7460 Unspecified cirrhosis of liver: Secondary | ICD-10-CM | POA: Insufficient documentation

## 2018-10-23 DIAGNOSIS — F17200 Nicotine dependence, unspecified, uncomplicated: Secondary | ICD-10-CM | POA: Insufficient documentation

## 2018-10-23 DIAGNOSIS — K7290 Hepatic failure, unspecified without coma: Secondary | ICD-10-CM | POA: Insufficient documentation

## 2018-10-23 DIAGNOSIS — N186 End stage renal disease: Secondary | ICD-10-CM | POA: Diagnosis not present

## 2018-10-23 DIAGNOSIS — I120 Hypertensive chronic kidney disease with stage 5 chronic kidney disease or end stage renal disease: Secondary | ICD-10-CM | POA: Insufficient documentation

## 2018-10-23 DIAGNOSIS — Z20828 Contact with and (suspected) exposure to other viral communicable diseases: Secondary | ICD-10-CM | POA: Insufficient documentation

## 2018-10-23 DIAGNOSIS — Z79899 Other long term (current) drug therapy: Secondary | ICD-10-CM | POA: Insufficient documentation

## 2018-10-23 DIAGNOSIS — R569 Unspecified convulsions: Secondary | ICD-10-CM | POA: Insufficient documentation

## 2018-10-23 HISTORY — PX: AV FISTULA PLACEMENT: SHX1204

## 2018-10-23 HISTORY — DX: Pneumonia, unspecified organism: J18.9

## 2018-10-23 HISTORY — DX: End stage renal disease: N18.6

## 2018-10-23 LAB — COMPREHENSIVE METABOLIC PANEL
ALT: 20 U/L (ref 0–44)
AST: 22 U/L (ref 15–41)
Albumin: 3.1 g/dL — ABNORMAL LOW (ref 3.5–5.0)
Alkaline Phosphatase: 145 U/L — ABNORMAL HIGH (ref 38–126)
Anion gap: 12 (ref 5–15)
BUN: 25 mg/dL — ABNORMAL HIGH (ref 6–20)
CO2: 23 mmol/L (ref 22–32)
Calcium: 8.4 mg/dL — ABNORMAL LOW (ref 8.9–10.3)
Chloride: 104 mmol/L (ref 98–111)
Creatinine, Ser: 3.32 mg/dL — ABNORMAL HIGH (ref 0.61–1.24)
GFR calc Af Amer: 24 mL/min — ABNORMAL LOW (ref >60)
GFR calc non Af Amer: 21 mL/min — ABNORMAL LOW (ref >60)
Glucose, Bld: 206 mg/dL — ABNORMAL HIGH (ref 70–99)
Potassium: 3.7 mmol/L (ref 3.5–5.1)
Sodium: 139 mmol/L (ref 135–145)
Total Bilirubin: 0.4 mg/dL (ref 0.3–1.2)
Total Protein: 7.3 g/dL (ref 6.5–8.1)

## 2018-10-23 LAB — GLUCOSE, CAPILLARY
Glucose-Capillary: 188 mg/dL — ABNORMAL HIGH (ref 70–99)
Glucose-Capillary: 185 mg/dL — ABNORMAL HIGH (ref 70–99)
Glucose-Capillary: 195 mg/dL — ABNORMAL HIGH (ref 70–99)

## 2018-10-23 LAB — SARS CORONAVIRUS 2 BY RT PCR (HOSPITAL ORDER, PERFORMED IN ~~LOC~~ HOSPITAL LAB): SARS Coronavirus 2: NEGATIVE

## 2018-10-23 SURGERY — ARTERIOVENOUS (AV) FISTULA CREATION
Anesthesia: Monitor Anesthesia Care | Site: Arm Upper | Laterality: Right

## 2018-10-23 MED ORDER — PROPOFOL 500 MG/50ML IV EMUL
INTRAVENOUS | Status: DC | PRN
  Administered 2018-10-23: 100 ug/kg/min via INTRAVENOUS

## 2018-10-23 MED ORDER — MIDAZOLAM HCL 2 MG/2ML IJ SOLN
INTRAMUSCULAR | Status: DC | PRN
  Administered 2018-10-23 (×2): 1 mg via INTRAVENOUS

## 2018-10-23 MED ORDER — LIDOCAINE-EPINEPHRINE (PF) 1 %-1:200000 IJ SOLN
INTRAMUSCULAR | Status: AC
  Filled 2018-10-23: qty 30

## 2018-10-23 MED ORDER — OXYCODONE HCL 5 MG/5ML PO SOLN: 5.0000 mg | Freq: Once | ORAL | Status: DC | PRN

## 2018-10-23 MED ORDER — CEFAZOLIN SODIUM-DEXTROSE 2-4 GM/100ML-% IV SOLN
INTRAVENOUS | Status: AC
  Filled 2018-10-23: qty 100

## 2018-10-23 MED ORDER — HYDROCODONE-ACETAMINOPHEN 5-325 MG PO TABS: 1.0000 | ORAL_TABLET | Freq: Four times a day (QID) | ORAL | 0 refills | Status: DC | PRN

## 2018-10-23 MED ORDER — PROPOFOL 10 MG/ML IV BOLUS
INTRAVENOUS | Status: DC | PRN
  Administered 2018-10-23: 15 mg via INTRAVENOUS

## 2018-10-23 MED ORDER — FENTANYL CITRATE (PF) 100 MCG/2ML IJ SOLN: 25.0000 ug | INTRAMUSCULAR | Status: DC | PRN

## 2018-10-23 MED ORDER — SODIUM CHLORIDE 0.9 % IV SOLN
INTRAVENOUS | Status: DC
  Administered 2018-10-23: 11:00:00 via INTRAVENOUS

## 2018-10-23 MED ORDER — CHLORHEXIDINE GLUCONATE 4 % EX LIQD: 60.0000 mL | Freq: Once | CUTANEOUS | Status: DC

## 2018-10-23 MED ORDER — CEFAZOLIN SODIUM-DEXTROSE 2-4 GM/100ML-% IV SOLN
2.0000 g | INTRAVENOUS | Status: AC
  Administered 2018-10-23: 14:00:00 2 g via INTRAVENOUS

## 2018-10-23 MED ORDER — LIDOCAINE-EPINEPHRINE (PF) 1 %-1:200000 IJ SOLN
INTRAMUSCULAR | Status: DC | PRN
  Administered 2018-10-23: 2 mL

## 2018-10-23 MED ORDER — FENTANYL CITRATE (PF) 250 MCG/5ML IJ SOLN
INTRAMUSCULAR | Status: AC
  Filled 2018-10-23: qty 5

## 2018-10-23 MED ORDER — ACETAMINOPHEN 160 MG/5ML PO SOLN: 1000.0000 mg | Freq: Once | ORAL | Status: DC | PRN

## 2018-10-23 MED ORDER — 0.9 % SODIUM CHLORIDE (POUR BTL) OPTIME
TOPICAL | Status: DC | PRN
  Administered 2018-10-23: 1000 mL

## 2018-10-23 MED ORDER — MIDAZOLAM HCL 2 MG/2ML IJ SOLN
INTRAMUSCULAR | Status: AC
  Filled 2018-10-23: qty 2

## 2018-10-23 MED ORDER — ONDANSETRON HCL 4 MG/2ML IJ SOLN
INTRAMUSCULAR | Status: DC | PRN
  Administered 2018-10-23: 4 mg via INTRAVENOUS

## 2018-10-23 MED ORDER — SODIUM CHLORIDE 0.9 % IV SOLN
INTRAVENOUS | Status: AC
  Filled 2018-10-23: qty 1.2

## 2018-10-23 MED ORDER — OXYCODONE HCL 5 MG PO TABS: 5.0000 mg | ORAL_TABLET | Freq: Once | ORAL | Status: DC | PRN

## 2018-10-23 MED ORDER — FENTANYL CITRATE (PF) 100 MCG/2ML IJ SOLN
INTRAMUSCULAR | Status: DC | PRN
  Administered 2018-10-23 (×3): 25 ug via INTRAVENOUS
  Administered 2018-10-23: 50 ug via INTRAVENOUS

## 2018-10-23 MED ORDER — SODIUM CHLORIDE 0.9 % IV SOLN
INTRAVENOUS | Status: DC | PRN
  Administered 2018-10-23: 500 mL

## 2018-10-23 MED ORDER — ACETAMINOPHEN 10 MG/ML IV SOLN: 1000.0000 mg | Freq: Once | INTRAVENOUS | Status: DC | PRN

## 2018-10-23 MED ORDER — ACETAMINOPHEN 500 MG PO TABS: 1000.0000 mg | ORAL_TABLET | Freq: Once | ORAL | Status: DC | PRN

## 2018-10-23 SURGICAL SUPPLY — 34 items
ADH SKN CLS APL DERMABOND .7 (GAUZE/BANDAGES/DRESSINGS) ×1
ARMBAND PINK RESTRICT EXTREMIT (MISCELLANEOUS) ×3 IMPLANT
CANISTER SUCT 3000ML PPV (MISCELLANEOUS) ×3 IMPLANT
CLIP VESOCCLUDE MED 6/CT (CLIP) ×3 IMPLANT
CLIP VESOCCLUDE SM WIDE 6/CT (CLIP) ×3 IMPLANT
COVER PROBE W GEL 5X96 (DRAPES) ×2 IMPLANT
COVER WAND RF STERILE (DRAPES) ×1 IMPLANT
DERMABOND ADVANCED (GAUZE/BANDAGES/DRESSINGS) ×2
DERMABOND ADVANCED .7 DNX12 (GAUZE/BANDAGES/DRESSINGS) ×1 IMPLANT
ELECT REM PT RETURN 9FT ADLT (ELECTROSURGICAL) ×3
ELECTRODE REM PT RTRN 9FT ADLT (ELECTROSURGICAL) ×1 IMPLANT
GLOVE BIO SURGEON STRL SZ7.5 (GLOVE) ×3 IMPLANT
GLOVE BIOGEL PI IND STRL 6.5 (GLOVE) IMPLANT
GLOVE BIOGEL PI IND STRL 7.5 (GLOVE) IMPLANT
GLOVE BIOGEL PI INDICATOR 6.5 (GLOVE) ×2
GLOVE BIOGEL PI INDICATOR 7.5 (GLOVE) ×2
GLOVE SURG SS PI 7.5 STRL IVOR (GLOVE) ×2 IMPLANT
GOWN STRL REUS W/ TWL LRG LVL3 (GOWN DISPOSABLE) ×2 IMPLANT
GOWN STRL REUS W/ TWL XL LVL3 (GOWN DISPOSABLE) ×1 IMPLANT
GOWN STRL REUS W/TWL LRG LVL3 (GOWN DISPOSABLE) ×3
GOWN STRL REUS W/TWL XL LVL3 (GOWN DISPOSABLE) ×6
INSERT FOGARTY SM (MISCELLANEOUS) IMPLANT
KIT BASIN OR (CUSTOM PROCEDURE TRAY) ×3 IMPLANT
KIT TURNOVER KIT B (KITS) ×3 IMPLANT
NS IRRIG 1000ML POUR BTL (IV SOLUTION) ×3 IMPLANT
PACK CV ACCESS (CUSTOM PROCEDURE TRAY) ×3 IMPLANT
PAD ARMBOARD 7.5X6 YLW CONV (MISCELLANEOUS) ×6 IMPLANT
SUT MNCRL AB 4-0 PS2 18 (SUTURE) ×3 IMPLANT
SUT PROLENE 6 0 BV (SUTURE) ×3 IMPLANT
SUT VIC AB 3-0 SH 27 (SUTURE) ×3
SUT VIC AB 3-0 SH 27X BRD (SUTURE) ×1 IMPLANT
TOWEL GREEN STERILE (TOWEL DISPOSABLE) ×3 IMPLANT
UNDERPAD 30X30 (UNDERPADS AND DIAPERS) ×3 IMPLANT
WATER STERILE IRR 1000ML POUR (IV SOLUTION) ×3 IMPLANT

## 2018-10-23 NOTE — Anesthesia Preprocedure Evaluation (Signed)
Anesthesia Evaluation  Patient identified by MRN, date of birth, ID band Patient awake    Reviewed: Allergy & Precautions, NPO status , Patient's Chart, lab work & pertinent test results  Airway Mallampati: III  TM Distance: >3 FB Neck ROM: Full    Dental  (+) Dental Advisory Given   Pulmonary neg shortness of breath, neg recent URI, Current Smoker and Patient abstained from smoking.,    breath sounds clear to auscultation       Cardiovascular hypertension,  Rhythm:Regular     Neuro/Psych Seizures -,  PSYCHIATRIC DISORDERS    GI/Hepatic Neg liver ROS,   Endo/Other  diabetes  Renal/GU ESRF and DialysisRenal disease     Musculoskeletal   Abdominal   Peds  Hematology   Anesthesia Other Findings   Reproductive/Obstetrics                             Anesthesia Physical Anesthesia Plan  ASA: IV  Anesthesia Plan: MAC   Post-op Pain Management:    Induction: Intravenous  PONV Risk Score and Plan: 0 and Treatment may vary due to age or medical condition and Propofol infusion  Airway Management Planned: Nasal Cannula  Additional Equipment: None  Intra-op Plan:   Post-operative Plan:   Informed Consent: I have reviewed the patients History and Physical, chart, labs and discussed the procedure including the risks, benefits and alternatives for the proposed anesthesia with the patient or authorized representative who has indicated his/her understanding and acceptance.     Dental advisory given  Plan Discussed with: CRNA and Surgeon  Anesthesia Plan Comments:         Anesthesia Quick Evaluation

## 2018-10-23 NOTE — Op Note (Signed)
    Patient name: Eddie Davies MRN: [id number] DOB: [birth year] Sex: male  10/23/2018 Pre-operative Diagnosis: End-stage renal disease Post-operative diagnosis:  Same Surgeon:  Annamarie Major Assistants: Arlee Muslim Procedure:   Right brachiocephalic fistula Anesthesia: MAC Blood Loss: Minimal Specimens: None  Findings: Excellent cephalic vein in the upper arm measuring 4-5 mm.  There was a healthy appearing 3.5 mm brachial artery.  Indications: The patient is on dialysis via catheter.  He is left-handed.  Preoperative vein mapping revealed an adequate cephalic vein.  He comes in today for fistula creation  Procedure:  The patient was identified in the holding area and taken to [place] [facility]  The patient was then placed supine on the table. MAC anesthesia was administered.  The patient was prepped and draped in the usual sterile fashion.  A time out was called and antibiotics were administered.  Ultrasound was used to evaluate the course of the cephalic vein in the upper arm.  There was a significant size difference between the forearm cephalic vein in the upper arm cephalic vein and therefore elected to proceed with a brachiocephalic fistula.  1% lidocaine was used for local anesthesia.  A transverse incision was made just proximal to the antecubital crease.  I first dissected out the brachial artery.  This was a disease free 3 mm artery which was circled with Vesseloops proximally and distally.  I then dissected out the cephalic vein.  This is a very healthy vein measuring 4-5 mm.  It was marked for orientation and then ligated distally.  The vein distended nicely.  The brachial artery was then occluded with vascular clamps and a #11 blade was used to make an arteriotomy which was extended longitudinally with Potts scissors.  The vein was cut the appropriate length and then spatulated to fit the size of the arteriotomy.  A running anastomosis was created with 6-0 Prolene.  Prior to  completion the appropriate flushing maneuvers were performed and the anastomosis was completed.  After completion, there was a brisk radial and ulnar Doppler signal at the wrist.  There was an excellent thrill within the fistula.  The wound was irrigated.  Hemostasis was achieved.  The incision was closed with 2 layers of Vicryl followed by Dermabond.  There were no complications.   Disposition: To PACU stable.   Theotis Burrow, M.D., [facility] [place] Office: [phone] Pager:  [phone]

## 2018-10-23 NOTE — Transfer of Care (Signed)
Immediate Anesthesia Transfer of Care Note  Patient: Eddie Davies  Procedure(s) Performed: ARTERIOVENOUS (AV) FISTULA CREATION RIGHT ARM (Right Arm Upper)  Patient Location: PACU  Anesthesia Type:MAC  Level of Consciousness: awake, alert  and patient cooperative  Airway & Oxygen Therapy: Patient Spontanous Breathing  Post-op Assessment: Report given to RN and Post -op Vital signs reviewed and stable  Post vital signs: Reviewed and stable  Last Vitals:  Vitals Value Taken Time  BP 168/87 10/23/18 1518  Temp    Pulse 77 10/23/18 1519  Resp    SpO2 95 % 10/23/18 1519  Vitals shown include unvalidated device data.  Last Pain:  Vitals:   10/23/18 1059  TempSrc:   PainSc: 0-No pain      Patients Stated Pain Goal: 4 (78/24/23 5361)  Complications: No apparent anesthesia complications

## 2018-10-23 NOTE — H&P (Signed)
   History and Physical Update  The patient was interviewed and re-examined.  The patient's previous History and Physical has been reviewed and is unchanged from recent office visit. Plan for right arm avf today in OR.   Apolonio Cutting C. Donzetta Matters, MD [facility] [place] Office: [phone] Pager: [phone]   10/23/2018, 11:55 AM

## 2018-10-23 NOTE — Interval H&P Note (Signed)
History and Physical Interval Note:  10/23/2018 1:36 PM  Eddie Davies  has presented today for surgery, with the diagnosis of END STAGE RENAL DISEASE FOR HEMODIALYSIS ACCESS.  The various methods of treatment have been discussed with the patient and family. After consideration of risks, benefits and other options for treatment, the patient has consented to  Procedure(s): ARTERIOVENOUS (AV) FISTULA CREATION RIGHT ARM (Right) as a surgical intervention.  The patient's history has been reviewed, patient examined, no change in status, stable for surgery.  I have reviewed the patient's chart and labs.  Questions were answered to the patient's satisfaction.     Annamarie Major

## 2018-10-23 NOTE — Discharge Instructions (Signed)
° °  [facility]  ° °Discharge Instructions ° °AV Fistula or Graft Surgery for Dialysis Access ° °Please refer to the following instructions for your post-procedure care. Your surgeon or physician assistant will discuss any changes with you. ° °Activity ° °You may drive the day following your surgery, if you are comfortable and no longer taking prescription pain medication. Resume full activity as the soreness in your incision resolves. ° °Bathing/Showering ° °You may shower after you go home. Keep your incision dry for 48 hours. Do not soak in a bathtub, hot tub, or swim until the incision heals completely. You may not shower if you have a hemodialysis catheter. ° °Incision Care ° °Clean your incision with mild soap and water after 48 hours. Pat the area dry with a clean towel. You do not need a bandage unless otherwise instructed. Do not apply any ointments or creams to your incision. You may have skin glue on your incision. Do not peel it off. It will come off on its own in about one week. Your arm may swell a bit after surgery. To reduce swelling use pillows to elevate your arm so it is above your heart. Your doctor will tell you if you need to lightly wrap your arm with an ACE bandage. ° °Diet ° °Resume your normal diet. There are not special food restrictions following this procedure. In order to heal from your surgery, it is CRITICAL to get adequate nutrition. Your body requires vitamins, minerals, and protein. Vegetables are the best source of vitamins and minerals. Vegetables also provide the perfect balance of protein. Processed food has little nutritional value, so try to avoid this. ° °Medications ° °Resume taking all of your medications. If your incision is causing pain, you may take over-the counter pain relievers such as acetaminophen (Tylenol). If you were prescribed a stronger pain medication, please be aware these medications can cause nausea and constipation. Prevent  nausea by taking the medication with a snack or meal. Avoid constipation by drinking plenty of fluids and eating foods with high amount of fiber, such as fruits, vegetables, and grains. Do not take Tylenol if you are taking prescription pain medications. ° ° ° ° °Follow up °Your surgeon may want to see you in the office following your access surgery. If so, this will be arranged at the time of your surgery. ° °Please call us immediately for any of the following conditions: ° °Increased pain, redness, drainage (pus) from your incision site °Fever of 101 degrees or higher °Severe or worsening pain at your incision site °Hand pain or numbness. ° °Reduce your risk of vascular disease: ° °Stop smoking. If you would like help, call [facility] at 1-800-QUIT-NOW (1-800-784-8669) or [facility] [place] at 336-586-4000 ° °Manage your cholesterol °Maintain a desired weight °Control your diabetes °Keep your blood pressure down ° °Dialysis ° °It will take several weeks to several months for your new dialysis access to be ready for use. Your surgeon will determine when it is OK to use it. Your nephrologist will continue to direct your dialysis. You can continue to use your Permcath until your new access is ready for use. ° °If you have any questions, please call the office at [phone]. ° °

## 2018-10-23 NOTE — Anesthesia Procedure Notes (Signed)
Procedure Name: MAC Date/Time: 10/23/2018 2:05 PM Performed by: Elayne Snare, CRNA Pre-anesthesia Checklist: Patient identified, Emergency Drugs available, Suction available and Patient being monitored Patient Re-evaluated:Patient Re-evaluated prior to induction Oxygen Delivery Method: Simple face mask

## 2018-10-24 ENCOUNTER — Encounter (HOSPITAL_COMMUNITY): Payer: Self-pay | Admitting: Surgery

## 2018-10-24 LAB — POCT I-STAT, CHEM 8
BUN: 27 mg/dL — ABNORMAL HIGH (ref 6–20)
Calcium, Ion: 1.1 mmol/L — ABNORMAL LOW (ref 1.15–1.40)
Chloride: 106 mmol/L (ref 98–111)
Creatinine, Ser: 3.1 mg/dL — ABNORMAL HIGH (ref 0.61–1.24)
Glucose, Bld: 197 mg/dL — ABNORMAL HIGH (ref 70–99)
HCT: 34 % — ABNORMAL LOW (ref 39.0–52.0)
Hemoglobin: 11.6 g/dL — ABNORMAL LOW (ref 13.0–17.0)
Potassium: 3.4 mmol/L — ABNORMAL LOW (ref 3.5–5.1)
Sodium: 140 mmol/L (ref 135–145)
TCO2: 21 mmol/L — ABNORMAL LOW (ref 22–32)

## 2018-10-24 NOTE — Anesthesia Postprocedure Evaluation (Signed)
Anesthesia Post Note  Patient: Eddie Davies  Procedure(s) Performed: ARTERIOVENOUS (AV) FISTULA CREATION RIGHT ARM (Right Arm Upper)     Patient location during evaluation: PACU Anesthesia Type: MAC Level of consciousness: awake and alert Pain management: pain level controlled Vital Signs Assessment: post-procedure vital signs reviewed and stable Respiratory status: spontaneous breathing, nonlabored ventilation, respiratory function stable and patient connected to nasal cannula oxygen Cardiovascular status: blood pressure returned to baseline and stable Postop Assessment: no apparent nausea or vomiting Anesthetic complications: no    Last Vitals:  Vitals:   10/23/18 1545 10/23/18 1550  BP:  (!) 167/85  Pulse:  71  Resp:  16  Temp: 36.7 C   SpO2:  98%    Last Pain:  Vitals:   10/23/18 1520  TempSrc:   PainSc: 4                  Chelsey L Woodrum

## 2018-11-27 ENCOUNTER — Encounter (HOSPITAL_COMMUNITY): Payer: Self-pay

## 2018-11-27 ENCOUNTER — Other Ambulatory Visit: Payer: Self-pay

## 2018-11-27 ENCOUNTER — Inpatient Hospital Stay (HOSPITAL_COMMUNITY)
Admission: EM | Admit: 2018-11-27 | Discharge: 2018-12-05 | DRG: 690 | Disposition: A | Discharge status: Skilled Nursing Facility | Payer: Medicare Other | Attending: Internal Medicine | Admitting: Internal Medicine

## 2018-11-27 DIAGNOSIS — N10 Acute tubulo-interstitial nephritis: Secondary | ICD-10-CM | POA: Diagnosis not present

## 2018-11-27 DIAGNOSIS — N200 Calculus of kidney: Secondary | ICD-10-CM | POA: Diagnosis present

## 2018-11-27 DIAGNOSIS — E1165 Type 2 diabetes mellitus with hyperglycemia: Secondary | ICD-10-CM | POA: Diagnosis present

## 2018-11-27 DIAGNOSIS — K7210 Chronic hepatic failure without coma: Secondary | ICD-10-CM | POA: Diagnosis present

## 2018-11-27 DIAGNOSIS — Z20828 Contact with and (suspected) exposure to other viral communicable diseases: Secondary | ICD-10-CM | POA: Diagnosis present

## 2018-11-27 DIAGNOSIS — R109 Unspecified abdominal pain: Secondary | ICD-10-CM | POA: Diagnosis not present

## 2018-11-27 DIAGNOSIS — Z91018 Allergy to other foods: Secondary | ICD-10-CM

## 2018-11-27 DIAGNOSIS — E118 Type 2 diabetes mellitus with unspecified complications: Secondary | ICD-10-CM | POA: Diagnosis present

## 2018-11-27 DIAGNOSIS — G40909 Epilepsy, unspecified, not intractable, without status epilepticus: Secondary | ICD-10-CM | POA: Diagnosis present

## 2018-11-27 DIAGNOSIS — M109 Gout, unspecified: Secondary | ICD-10-CM | POA: Diagnosis present

## 2018-11-27 DIAGNOSIS — N400 Enlarged prostate without lower urinary tract symptoms: Secondary | ICD-10-CM | POA: Diagnosis present

## 2018-11-27 DIAGNOSIS — K7290 Hepatic failure, unspecified without coma: Secondary | ICD-10-CM | POA: Diagnosis present

## 2018-11-27 DIAGNOSIS — F17210 Nicotine dependence, cigarettes, uncomplicated: Secondary | ICD-10-CM | POA: Diagnosis present

## 2018-11-27 DIAGNOSIS — R0989 Other specified symptoms and signs involving the circulatory and respiratory systems: Secondary | ICD-10-CM

## 2018-11-27 DIAGNOSIS — N12 Tubulo-interstitial nephritis, not specified as acute or chronic: Secondary | ICD-10-CM

## 2018-11-27 DIAGNOSIS — Z992 Dependence on renal dialysis: Secondary | ICD-10-CM

## 2018-11-27 DIAGNOSIS — N186 End stage renal disease: Secondary | ICD-10-CM | POA: Diagnosis present

## 2018-11-27 DIAGNOSIS — K219 Gastro-esophageal reflux disease without esophagitis: Secondary | ICD-10-CM | POA: Diagnosis present

## 2018-11-27 DIAGNOSIS — I120 Hypertensive chronic kidney disease with stage 5 chronic kidney disease or end stage renal disease: Secondary | ICD-10-CM | POA: Diagnosis present

## 2018-11-27 DIAGNOSIS — E876 Hypokalemia: Secondary | ICD-10-CM | POA: Diagnosis not present

## 2018-11-27 DIAGNOSIS — E785 Hyperlipidemia, unspecified: Secondary | ICD-10-CM | POA: Diagnosis present

## 2018-11-27 DIAGNOSIS — D631 Anemia in chronic kidney disease: Secondary | ICD-10-CM | POA: Diagnosis present

## 2018-11-27 DIAGNOSIS — I10 Essential (primary) hypertension: Secondary | ICD-10-CM | POA: Diagnosis present

## 2018-11-27 DIAGNOSIS — R066 Hiccough: Secondary | ICD-10-CM | POA: Diagnosis not present

## 2018-11-27 DIAGNOSIS — K7460 Unspecified cirrhosis of liver: Secondary | ICD-10-CM | POA: Diagnosis present

## 2018-11-27 DIAGNOSIS — E1122 Type 2 diabetes mellitus with diabetic chronic kidney disease: Secondary | ICD-10-CM | POA: Diagnosis present

## 2018-11-27 DIAGNOSIS — Z794 Long term (current) use of insulin: Secondary | ICD-10-CM

## 2018-11-27 DIAGNOSIS — N2581 Secondary hyperparathyroidism of renal origin: Secondary | ICD-10-CM | POA: Diagnosis present

## 2018-11-27 LAB — CBC
HCT: 26.1 % — ABNORMAL LOW (ref 39.0–52.0)
Hemoglobin: 8.8 g/dL — ABNORMAL LOW (ref 13.0–17.0)
MCH: 31.5 pg (ref 26.0–34.0)
MCHC: 33.7 g/dL (ref 30.0–36.0)
MCV: 93.5 fL (ref 80.0–100.0)
Platelets: 141 10*3/uL — ABNORMAL LOW (ref 150–400)
RBC: 2.79 MIL/uL — ABNORMAL LOW (ref 4.22–5.81)
RDW: 14.5 % (ref 11.5–15.5)
WBC: 8.2 10*3/uL (ref 4.0–10.5)
nRBC: 0 % (ref 0.0–0.2)

## 2018-11-27 LAB — LIPASE, BLOOD: Lipase: 11 U/L (ref 11–51)

## 2018-11-27 LAB — COMPREHENSIVE METABOLIC PANEL
ALT: 14 U/L (ref 0–44)
AST: 17 U/L (ref 15–41)
Albumin: 2.7 g/dL — ABNORMAL LOW (ref 3.5–5.0)
Alkaline Phosphatase: 125 U/L (ref 38–126)
Anion gap: 16 — ABNORMAL HIGH (ref 5–15)
BUN: 66 mg/dL — ABNORMAL HIGH (ref 6–20)
CO2: 19 mmol/L — ABNORMAL LOW (ref 22–32)
Calcium: 7.9 mg/dL — ABNORMAL LOW (ref 8.9–10.3)
Chloride: 100 mmol/L (ref 98–111)
Creatinine, Ser: 7.61 mg/dL — ABNORMAL HIGH (ref 0.61–1.24)
GFR calc Af Amer: 9 mL/min — ABNORMAL LOW (ref >60)
GFR calc non Af Amer: 8 mL/min — ABNORMAL LOW (ref >60)
Glucose, Bld: 336 mg/dL — ABNORMAL HIGH (ref 70–99)
Potassium: 3.1 mmol/L — ABNORMAL LOW (ref 3.5–5.1)
Sodium: 135 mmol/L (ref 135–145)
Total Bilirubin: 0.7 mg/dL (ref 0.3–1.2)
Total Protein: 6.5 g/dL (ref 6.5–8.1)

## 2018-11-27 MED ORDER — ONDANSETRON 4 MG PO TBDP
4.0000 mg | ORAL_TABLET | Freq: Once | ORAL | Status: AC
  Administered 2018-11-27: 23:00:00 4 mg via ORAL
  Filled 2018-11-27: qty 1

## 2018-11-27 MED ORDER — SODIUM CHLORIDE 0.9% FLUSH
3.0000 mL | Freq: Once | INTRAVENOUS | Status: AC
  Administered 2018-11-28: 02:00:00 3 mL via INTRAVENOUS

## 2018-11-27 NOTE — ED Triage Notes (Addendum)
Per PTAR pt presents w/abd pain, N/V, hyperglycemia and missed dialysis yesterday. Pt from [facility]

## 2018-11-28 ENCOUNTER — Emergency Department (HOSPITAL_COMMUNITY): Payer: Medicare Other

## 2018-11-28 ENCOUNTER — Inpatient Hospital Stay (HOSPITAL_COMMUNITY): Payer: Medicare Other

## 2018-11-28 ENCOUNTER — Encounter (HOSPITAL_COMMUNITY): Payer: Self-pay | Admitting: Internal Medicine

## 2018-11-28 DIAGNOSIS — N10 Acute tubulo-interstitial nephritis: Secondary | ICD-10-CM | POA: Diagnosis present

## 2018-11-28 DIAGNOSIS — Z20828 Contact with and (suspected) exposure to other viral communicable diseases: Secondary | ICD-10-CM | POA: Diagnosis present

## 2018-11-28 DIAGNOSIS — N186 End stage renal disease: Secondary | ICD-10-CM | POA: Diagnosis present

## 2018-11-28 DIAGNOSIS — E876 Hypokalemia: Secondary | ICD-10-CM | POA: Diagnosis not present

## 2018-11-28 DIAGNOSIS — G40909 Epilepsy, unspecified, not intractable, without status epilepticus: Secondary | ICD-10-CM | POA: Diagnosis present

## 2018-11-28 DIAGNOSIS — N12 Tubulo-interstitial nephritis, not specified as acute or chronic: Secondary | ICD-10-CM | POA: Diagnosis present

## 2018-11-28 DIAGNOSIS — Z794 Long term (current) use of insulin: Secondary | ICD-10-CM | POA: Diagnosis not present

## 2018-11-28 DIAGNOSIS — N2581 Secondary hyperparathyroidism of renal origin: Secondary | ICD-10-CM | POA: Diagnosis present

## 2018-11-28 DIAGNOSIS — R109 Unspecified abdominal pain: Secondary | ICD-10-CM | POA: Diagnosis present

## 2018-11-28 DIAGNOSIS — M109 Gout, unspecified: Secondary | ICD-10-CM | POA: Diagnosis present

## 2018-11-28 DIAGNOSIS — N200 Calculus of kidney: Secondary | ICD-10-CM | POA: Diagnosis present

## 2018-11-28 DIAGNOSIS — I10 Essential (primary) hypertension: Secondary | ICD-10-CM | POA: Diagnosis not present

## 2018-11-28 DIAGNOSIS — E1165 Type 2 diabetes mellitus with hyperglycemia: Secondary | ICD-10-CM | POA: Diagnosis not present

## 2018-11-28 DIAGNOSIS — E118 Type 2 diabetes mellitus with unspecified complications: Secondary | ICD-10-CM | POA: Diagnosis not present

## 2018-11-28 DIAGNOSIS — F17210 Nicotine dependence, cigarettes, uncomplicated: Secondary | ICD-10-CM | POA: Diagnosis present

## 2018-11-28 DIAGNOSIS — I120 Hypertensive chronic kidney disease with stage 5 chronic kidney disease or end stage renal disease: Secondary | ICD-10-CM | POA: Diagnosis present

## 2018-11-28 DIAGNOSIS — K7460 Unspecified cirrhosis of liver: Secondary | ICD-10-CM | POA: Diagnosis present

## 2018-11-28 DIAGNOSIS — E785 Hyperlipidemia, unspecified: Secondary | ICD-10-CM | POA: Diagnosis present

## 2018-11-28 DIAGNOSIS — K219 Gastro-esophageal reflux disease without esophagitis: Secondary | ICD-10-CM | POA: Diagnosis present

## 2018-11-28 DIAGNOSIS — Z91018 Allergy to other foods: Secondary | ICD-10-CM | POA: Diagnosis not present

## 2018-11-28 DIAGNOSIS — N400 Enlarged prostate without lower urinary tract symptoms: Secondary | ICD-10-CM | POA: Diagnosis present

## 2018-11-28 DIAGNOSIS — Z992 Dependence on renal dialysis: Secondary | ICD-10-CM | POA: Diagnosis not present

## 2018-11-28 DIAGNOSIS — R066 Hiccough: Secondary | ICD-10-CM | POA: Diagnosis not present

## 2018-11-28 DIAGNOSIS — E1122 Type 2 diabetes mellitus with diabetic chronic kidney disease: Secondary | ICD-10-CM | POA: Diagnosis present

## 2018-11-28 DIAGNOSIS — K7290 Hepatic failure, unspecified without coma: Secondary | ICD-10-CM | POA: Diagnosis present

## 2018-11-28 DIAGNOSIS — D631 Anemia in chronic kidney disease: Secondary | ICD-10-CM | POA: Diagnosis present

## 2018-11-28 LAB — GLUCOSE, CAPILLARY
Glucose-Capillary: 356 mg/dL — ABNORMAL HIGH (ref 70–99)
Glucose-Capillary: 289 mg/dL — ABNORMAL HIGH (ref 70–99)
Glucose-Capillary: 304 mg/dL — ABNORMAL HIGH (ref 70–99)

## 2018-11-28 LAB — URINALYSIS, MICROSCOPIC (REFLEX)
RBC / HPF: >50 RBC/hpf (ref 0–5)
Squamous Epithelial / HPF: NONE SEEN (ref 0–5)
WBC, UA: >50 WBC/hpf (ref 0–5)

## 2018-11-28 LAB — URINALYSIS, ROUTINE W REFLEX MICROSCOPIC

## 2018-11-28 LAB — BASIC METABOLIC PANEL
Anion gap: 22 — ABNORMAL HIGH (ref 5–15)
BUN: 77 mg/dL — ABNORMAL HIGH (ref 6–20)
CO2: 16 mmol/L — ABNORMAL LOW (ref 22–32)
Calcium: 7.7 mg/dL — ABNORMAL LOW (ref 8.9–10.3)
Chloride: 100 mmol/L (ref 98–111)
Creatinine, Ser: 8.56 mg/dL — ABNORMAL HIGH (ref 0.61–1.24)
GFR calc Af Amer: 8 mL/min — ABNORMAL LOW (ref >60)
GFR calc non Af Amer: 7 mL/min — ABNORMAL LOW (ref >60)
Glucose, Bld: 524 mg/dL (ref 70–99)
Potassium: 4.2 mmol/L (ref 3.5–5.1)
Sodium: 138 mmol/L (ref 135–145)

## 2018-11-28 LAB — MAGNESIUM: Magnesium: 2.2 mg/dL (ref 1.7–2.4)

## 2018-11-28 LAB — SARS CORONAVIRUS 2 (TAT 6-24 HRS): SARS Coronavirus 2: NEGATIVE

## 2018-11-28 LAB — CBG MONITORING, ED: Glucose-Capillary: 497 mg/dL — ABNORMAL HIGH (ref 70–99)

## 2018-11-28 MED ORDER — CARBAMAZEPINE 200 MG PO TABS
800.0000 mg | ORAL_TABLET | Freq: Two times a day (BID) | ORAL | Status: DC
  Administered 2018-11-28 – 2018-12-05 (×15): 800 mg via ORAL
  Filled 2018-11-28 (×15): qty 4

## 2018-11-28 MED ORDER — INSULIN ASPART 100 UNIT/ML ~~LOC~~ SOLN
15.0000 [IU] | Freq: Once | SUBCUTANEOUS | Status: AC
  Administered 2018-11-28: 15 [IU] via SUBCUTANEOUS

## 2018-11-28 MED ORDER — POTASSIUM CHLORIDE CRYS ER 20 MEQ PO TBCR: 40.0000 meq | EXTENDED_RELEASE_TABLET | Freq: Once | ORAL | Status: DC

## 2018-11-28 MED ORDER — ALLOPURINOL 100 MG PO TABS
100.0000 mg | ORAL_TABLET | Freq: Every day | ORAL | Status: DC
  Administered 2018-11-29 – 2018-12-05 (×7): 100 mg via ORAL
  Filled 2018-11-28 (×8): qty 1

## 2018-11-28 MED ORDER — ONDANSETRON HCL 4 MG/2ML IJ SOLN
4.0000 mg | Freq: Once | INTRAMUSCULAR | Status: AC
  Administered 2018-11-28: 4 mg via INTRAVENOUS

## 2018-11-28 MED ORDER — HEPARIN SODIUM (PORCINE) 1000 UNIT/ML DIALYSIS
3000.0000 [IU] | Freq: Once | INTRAMUSCULAR | Status: AC
  Administered 2018-11-28: 19:00:00 3000 [IU] via INTRAVENOUS_CENTRAL

## 2018-11-28 MED ORDER — PRO-STAT SUGAR FREE PO LIQD
30.0000 mL | Freq: Two times a day (BID) | ORAL | Status: DC
  Administered 2018-11-28 – 2018-12-05 (×9): 30 mL via ORAL
  Filled 2018-11-28 (×14): qty 30

## 2018-11-28 MED ORDER — CALCIUM ACETATE (PHOS BINDER) 667 MG PO CAPS
2001.0000 mg | ORAL_CAPSULE | Freq: Three times a day (TID) | ORAL | Status: DC
  Administered 2018-11-29 – 2018-12-05 (×13): 2001 mg via ORAL
  Filled 2018-11-28 (×16): qty 3

## 2018-11-28 MED ORDER — LACTULOSE 10 GM/15ML PO SOLN
30.0000 g | Freq: Two times a day (BID) | ORAL | Status: DC
  Administered 2018-11-30 – 2018-12-04 (×6): 30 g via ORAL
  Filled 2018-11-28 (×9): qty 45

## 2018-11-28 MED ORDER — INSULIN ASPART 100 UNIT/ML ~~LOC~~ SOLN
0.0000 [IU] | Freq: Three times a day (TID) | SUBCUTANEOUS | Status: DC
  Administered 2018-11-28: 8 [IU] via SUBCUTANEOUS
  Administered 2018-11-29: 11 [IU] via SUBCUTANEOUS
  Administered 2018-11-29: 18:00:00 5 [IU] via SUBCUTANEOUS
  Administered 2018-11-29: 11 [IU] via SUBCUTANEOUS
  Administered 2018-11-30: 5 [IU] via SUBCUTANEOUS
  Administered 2018-11-30: 11 [IU] via SUBCUTANEOUS
  Administered 2018-12-01: 8 [IU] via SUBCUTANEOUS
  Administered 2018-12-01: 5 [IU] via SUBCUTANEOUS
  Administered 2018-12-01: 2 [IU] via SUBCUTANEOUS
  Administered 2018-12-02: 11 [IU] via SUBCUTANEOUS
  Administered 2018-12-02: 2 [IU] via SUBCUTANEOUS
  Administered 2018-12-02: 11 [IU] via SUBCUTANEOUS
  Administered 2018-12-03: 3 [IU] via SUBCUTANEOUS
  Administered 2018-12-03: 11 [IU] via SUBCUTANEOUS
  Administered 2018-12-04 (×3): 5 [IU] via SUBCUTANEOUS
  Administered 2018-12-05: 10:00:00 11 [IU] via SUBCUTANEOUS

## 2018-11-28 MED ORDER — AMLODIPINE BESYLATE 5 MG PO TABS
5.0000 mg | ORAL_TABLET | Freq: Every day | ORAL | Status: DC
  Administered 2018-11-28 – 2018-12-04 (×7): 5 mg via ORAL
  Filled 2018-11-28 (×7): qty 1

## 2018-11-28 MED ORDER — INSULIN ASPART 100 UNIT/ML ~~LOC~~ SOLN: 0.0000 [IU] | Freq: Three times a day (TID) | SUBCUTANEOUS | Status: DC

## 2018-11-28 MED ORDER — HEPARIN SODIUM (PORCINE) 5000 UNIT/ML IJ SOLN
5000.0000 [IU] | Freq: Three times a day (TID) | INTRAMUSCULAR | Status: DC
  Administered 2018-11-28 – 2018-12-05 (×21): 5000 [IU] via SUBCUTANEOUS
  Filled 2018-11-28 (×21): qty 1

## 2018-11-28 MED ORDER — INSULIN ASPART 100 UNIT/ML ~~LOC~~ SOLN
0.0000 [IU] | Freq: Every day | SUBCUTANEOUS | Status: DC
  Administered 2018-11-28: 4 [IU] via SUBCUTANEOUS
  Administered 2018-11-29: 22:00:00 3 [IU] via SUBCUTANEOUS
  Administered 2018-11-30 – 2018-12-02 (×3): 2 [IU] via SUBCUTANEOUS
  Administered 2018-12-03: 5 [IU] via SUBCUTANEOUS
  Administered 2018-12-04: 3 [IU] via SUBCUTANEOUS

## 2018-11-28 MED ORDER — INSULIN GLARGINE 100 UNIT/ML ~~LOC~~ SOLN: 20.0000 [IU] | Freq: Every day | SUBCUTANEOUS | Status: DC

## 2018-11-28 MED ORDER — ONDANSETRON HCL 4 MG/2ML IJ SOLN
4.0000 mg | Freq: Four times a day (QID) | INTRAMUSCULAR | Status: DC | PRN
  Administered 2018-11-28 – 2018-12-04 (×7): 4 mg via INTRAVENOUS
  Filled 2018-11-28 (×5): qty 2

## 2018-11-28 MED ORDER — NEPRO/CARBSTEADY PO LIQD
237.0000 mL | Freq: Two times a day (BID) | ORAL | Status: DC
  Administered 2018-11-29 – 2018-12-03 (×4): 237 mL via ORAL

## 2018-11-28 MED ORDER — PHENOBARBITAL 97.2 MG PO TABS
259.2000 mg | ORAL_TABLET | Freq: Every day | ORAL | Status: DC
  Administered 2018-11-28 – 2018-12-04 (×7): 259.2 mg via ORAL
  Filled 2018-11-28 (×7): qty 2

## 2018-11-28 MED ORDER — HEPARIN SODIUM (PORCINE) 1000 UNIT/ML IJ SOLN
INTRAMUSCULAR | Status: AC
  Administered 2018-11-28: 1000 [IU]
  Filled 2018-11-28: qty 3

## 2018-11-28 MED ORDER — SIMVASTATIN 20 MG PO TABS
20.0000 mg | ORAL_TABLET | Freq: Every day | ORAL | Status: DC
  Administered 2018-11-28 – 2018-12-04 (×7): 20 mg via ORAL
  Filled 2018-11-28 (×7): qty 1

## 2018-11-28 MED ORDER — CLONIDINE HCL 0.1 MG PO TABS: 0.1000 mg | ORAL_TABLET | Freq: Two times a day (BID) | ORAL | Status: DC | PRN

## 2018-11-28 MED ORDER — INSULIN GLARGINE 100 UNIT/ML ~~LOC~~ SOLN
24.0000 [IU] | Freq: Every day | SUBCUTANEOUS | Status: DC
  Filled 2018-11-28: qty 0.24

## 2018-11-28 MED ORDER — CHLORHEXIDINE GLUCONATE CLOTH 2 % EX PADS
6.0000 | MEDICATED_PAD | Freq: Every day | CUTANEOUS | Status: DC
  Administered 2018-11-28 – 2018-12-05 (×8): 6 via TOPICAL

## 2018-11-28 MED ORDER — ADULT MULTIVITAMIN W/MINERALS CH
1.0000 | ORAL_TABLET | Freq: Every day | ORAL | Status: DC
  Administered 2018-11-28 – 2018-12-04 (×7): 1 via ORAL
  Filled 2018-11-28 (×7): qty 1

## 2018-11-28 MED ORDER — FERROUS SULFATE 325 (65 FE) MG PO TABS
325.0000 mg | ORAL_TABLET | Freq: Every day | ORAL | Status: DC
  Administered 2018-12-02 – 2018-12-05 (×4): 325 mg via ORAL
  Filled 2018-11-28 (×7): qty 1

## 2018-11-28 MED ORDER — MORPHINE SULFATE (PF) 2 MG/ML IV SOLN
1.0000 mg | INTRAVENOUS | Status: DC | PRN
  Administered 2018-11-28 – 2018-12-04 (×4): 1 mg via INTRAVENOUS
  Filled 2018-11-28 (×5): qty 1

## 2018-11-28 MED ORDER — INSULIN ASPART 100 UNIT/ML ~~LOC~~ SOLN
10.0000 [IU] | Freq: Once | SUBCUTANEOUS | Status: AC
  Administered 2018-11-28: 09:00:00 10 [IU] via SUBCUTANEOUS

## 2018-11-28 MED ORDER — INSULIN GLARGINE 100 UNIT/ML ~~LOC~~ SOLN
13.0000 [IU] | Freq: Every day | SUBCUTANEOUS | Status: DC
  Filled 2018-11-28: qty 0.13

## 2018-11-28 MED ORDER — MORPHINE SULFATE (PF) 4 MG/ML IV SOLN
4.0000 mg | Freq: Once | INTRAVENOUS | Status: AC
  Administered 2018-11-28: 02:00:00 4 mg via INTRAVENOUS
  Filled 2018-11-28: qty 1

## 2018-11-28 MED ORDER — SODIUM CHLORIDE 0.9 % IV SOLN: 1.0000 g | Freq: Three times a day (TID) | INTRAVENOUS | Status: DC

## 2018-11-28 MED ORDER — ONDANSETRON HCL 4 MG/2ML IJ SOLN
INTRAMUSCULAR | Status: AC
  Filled 2018-11-28: qty 2

## 2018-11-28 MED ORDER — ACETAMINOPHEN 325 MG PO TABS
650.0000 mg | ORAL_TABLET | Freq: Three times a day (TID) | ORAL | Status: DC | PRN
  Administered 2018-12-01: 650 mg via ORAL
  Filled 2018-11-28: qty 2

## 2018-11-28 MED ORDER — HEPARIN SODIUM (PORCINE) 1000 UNIT/ML IJ SOLN
INTRAMUSCULAR | Status: AC
  Administered 2018-11-28: 3000 [IU] via INTRAVENOUS_CENTRAL
  Filled 2018-11-28: qty 4

## 2018-11-28 MED ORDER — INSULIN GLARGINE 100 UNIT/ML ~~LOC~~ SOLN
20.0000 [IU] | Freq: Every day | SUBCUTANEOUS | Status: DC
  Administered 2018-11-28 – 2018-11-29 (×2): 20 [IU] via SUBCUTANEOUS
  Filled 2018-11-28 (×3): qty 0.2

## 2018-11-28 MED ORDER — SODIUM CHLORIDE 0.9 % IV SOLN
500.0000 mg | INTRAVENOUS | Status: DC
  Administered 2018-11-28 – 2018-12-01 (×4): 500 mg via INTRAVENOUS
  Filled 2018-11-28 (×4): qty 0.5

## 2018-11-28 MED ORDER — ONDANSETRON HCL 4 MG/2ML IJ SOLN
4.0000 mg | Freq: Once | INTRAMUSCULAR | Status: AC
  Administered 2018-11-28: 4 mg via INTRAVENOUS
  Filled 2018-11-28: qty 2

## 2018-11-28 MED ORDER — VITAMIN D 25 MCG (1000 UNIT) PO TABS
2000.0000 [IU] | ORAL_TABLET | Freq: Every day | ORAL | Status: DC
  Administered 2018-11-29 – 2018-12-05 (×7): 2000 [IU] via ORAL
  Filled 2018-11-28 (×8): qty 2

## 2018-11-28 MED ORDER — PANTOPRAZOLE SODIUM 20 MG PO TBEC
20.0000 mg | DELAYED_RELEASE_TABLET | Freq: Every day | ORAL | Status: DC
  Administered 2018-11-29 – 2018-12-01 (×3): 20 mg via ORAL
  Filled 2018-11-28 (×4): qty 1

## 2018-11-28 MED ORDER — TAMSULOSIN HCL 0.4 MG PO CAPS
0.4000 mg | ORAL_CAPSULE | Freq: Every day | ORAL | Status: DC
  Administered 2018-11-28 – 2018-12-04 (×7): 0.4 mg via ORAL
  Filled 2018-11-28 (×7): qty 1

## 2018-11-28 NOTE — Progress Notes (Signed)
CRITICAL VALUE ALERT  Critical Value:  524   Date & Time Notied:  11/28/2018 at 56   Provider Notified: Dr. Tana Coast  Orders Received/Actions taken: new orders received for lantus 20 units and novolog 15 units to be given. Will continue to monitor patient closely

## 2018-11-28 NOTE — ED Notes (Signed)
ED TO INPATIENT HANDOFF REPORT  ED Nurse Name and Phone #:   S Name/Age/Gender Eddie Davies 48 y.o. male Room/Bed: 035C/035C  Code Status   Code Status: Full Code  Home/SNF/Other Skilled nursing facility Patient oriented to: self, place, time and situation Is this baseline? Yes   Triage Complete: Triage complete  Chief Complaint abd pain  Triage Note Per PT[facility] pt presents w/abd pain, N/V, hyperglycemia and missed dialysis yesterday. Pt from [facility]    Allergies Allergies  Allergen Reactions  . Fish Oil Swelling and Other (See Comments)         Level of Care/Admitting Diagnosis ED Disposition    ED Disposition Condition [facility] Area: [place] [[id number]]  Level of Care: Telemetry Medical [104]  Covid Evaluation: Asymptomatic Screening Protocol (No Symptoms)  Diagnosis: Pyelonephritis [903009]  Admitting Physician: Shela Leff [[id number]]  Attending Physician: Shela Leff [[id number]]  Estimated length of stay: past midnight tomorrow  Certification:: I certify this patient will need inpatient services for at least 2 midnights  PT Class (Do Not Modify): Inpatient [101]  PT Acc Code (Do Not Modify): Private [1]       B Medical/Surgery History Past Medical History:  Diagnosis Date  . Cirrhosis ([place]) 04/2018  . Diabetes mellitus without complication (HCC)    type II  . ESRD (end stage renal disease) ([place])    MWF   . Hypertension   . Liver failure ([place])   . Metabolic acidosis 45/6256  . Pneumonia    Past Surgical History:  Procedure Laterality Date  . AV FISTULA PLACEMENT Right 10/23/2018   Procedure: ARTERIOVENOUS (AV) FISTULA CREATION RIGHT ARM;  Surgeon: [name], MD;  Location: [place];  Service: Vascular;  Laterality: Right;  . CHOLECYSTECTOMY    . IR FLUORO GUIDE CV LINE RIGHT  05/30/2018  . IR US GUIDE VASC ACCESS RIGHT  05/30/2018     A IV Location/Drains/Wounds Patient  Lines/Drains/Airways Status   Active Line/Drains/Airways    Name:   Placement date:   Placement time:   Site:   Days:   Peripheral IV 11/28/18 Left Hand   11/28/18    0146    Hand   less than 1   Fistula / Graft Right Forearm Arteriovenous fistula   10/23/18    1455    Forearm   36   Hemodialysis Catheter Right Internal jugular Double-lumen;Permanent   05/30/18    1552    Internal jugular   182   Incision (Closed) 10/23/18 Arm Right   10/23/18    1432     36   Pressure Injury 04/21/18 Stage II -  Partial thickness loss of dermis presenting as a shallow open ulcer with a red, pink wound bed without slough. redness, possible previous injury   04/21/18    2200     221   Wound / Incision (Open or Dehisced) 04/13/18 Scrotum Posterior;Medial   04/13/18    1100    Scrotum   229          Intake/Output Last 24 hours  Intake/Output Summary (Last 24 hours) at 11/28/2018 3893 Last data filed at 11/28/2018 7342 Gross per 24 hour  Intake -  Output 400 ml  Net -400 ml    Labs/Imaging Results for orders placed or performed during the hospital encounter of 11/27/18 (from the past 48 hour(s))  Lipase, blood     Status: None   Collection Time: 11/27/18  8:19 PM  Result Value Ref Range   Lipase 11 11 - 51 U/L    Comment: Performed at [facility], [place] [address], [place], [place] [address]  Comprehensive metabolic panel     Status: Abnormal   Collection Time: 11/27/18  8:19 PM  Result Value Ref Range   Sodium 135 135 - 145 mmol/L   Potassium 3.1 (L) 3.5 - 5.1 mmol/L   Chloride 100 98 - 111 mmol/L   CO2 19 (L) 22 - 32 mmol/L   Glucose, Bld 336 (H) 70 - 99 mg/dL   BUN 66 (H) 6 - 20 mg/dL   Creatinine, Ser 7.61 (H) 0.61 - 1.24 mg/dL   Calcium 7.9 (L) 8.9 - 10.3 mg/dL   Total Protein 6.5 6.5 - 8.1 g/dL   Albumin 2.7 (L) 3.5 - 5.0 g/dL   AST 17 15 - 41 U/L   ALT 14 0 - 44 U/L   Alkaline Phosphatase 125 38 - 126 U/L   Total Bilirubin 0.7 0.3 - 1.2 mg/dL   GFR calc non Af Amer 8 (L) >60  mL/min   GFR calc Af Amer 9 (L) >60 mL/min   Anion gap 16 (H) 5 - 15    Comment: Performed at [facility], [place] [address], [place], [place] [address]  CBC     Status: Abnormal   Collection Time: 11/27/18  8:19 PM  Result Value Ref Range   WBC 8.2 4.0 - 10.5 K/uL   RBC 2.79 (L) 4.22 - 5.81 MIL/uL   Hemoglobin 8.8 (L) 13.0 - 17.0 g/dL   HCT 26.1 (L) 39.0 - 52.0 %   MCV 93.5 80.0 - 100.0 fL   MCH 31.5 26.0 - 34.0 pg   MCHC 33.7 30.0 - 36.0 g/dL   RDW 14.5 11.5 - 15.5 %   Platelets 141 (L) 150 - 400 K/uL   nRBC 0.0 0.0 - 0.2 %    Comment: Performed at [facility], [place] [address], [place], [place] [address]  Urinalysis, Routine w reflex microscopic     Status: Abnormal   Collection Time: 11/28/18  4:31 AM  Result Value Ref Range   Color, Urine RED (A) YELLOW    Comment: BIOCHEMICALS MAY BE AFFECTED BY COLOR CORRECTED ON 11/04 AT 0541: PREVIOUSLY REPORTED AS RED    APPearance TURBID (A) CLEAR   Specific Gravity, Urine  1.005 - 1.030    TEST NOT REPORTED DUE TO COLOR INTERFERENCE OF URINE PIGMENT   pH  5.0 - 8.0    TEST NOT REPORTED DUE TO COLOR INTERFERENCE OF URINE PIGMENT   Glucose, UA (A) NEGATIVE mg/dL    TEST NOT REPORTED DUE TO COLOR INTERFERENCE OF URINE PIGMENT   Hgb urine dipstick (A) NEGATIVE    TEST NOT REPORTED DUE TO COLOR INTERFERENCE OF URINE PIGMENT   Bilirubin Urine (A) NEGATIVE    TEST NOT REPORTED DUE TO COLOR INTERFERENCE OF URINE PIGMENT   Ketones, ur (A) NEGATIVE mg/dL    TEST NOT REPORTED DUE TO COLOR INTERFERENCE OF URINE PIGMENT   Protein, ur (A) NEGATIVE mg/dL    TEST NOT REPORTED DUE TO COLOR INTERFERENCE OF URINE PIGMENT   Nitrite (A) NEGATIVE    TEST NOT REPORTED DUE TO COLOR INTERFERENCE OF URINE PIGMENT   Leukocytes,Ua (A) NEGATIVE    TEST NOT REPORTED DUE TO COLOR INTERFERENCE OF URINE PIGMENT    Comment: Performed at [facility], [place] [address], [place], [place]  [address]  Urinalysis, Microscopic (reflex)     Status:  Abnormal   Collection Time: 11/28/18  4:31 AM  Result Value Ref Range   RBC / HPF >50 0 - 5 RBC/hpf   WBC, UA >50 0 - 5 WBC/hpf   Bacteria, UA MANY (A) NONE SEEN   Squamous Epithelial / LPF NONE SEEN 0 - 5   Non Squamous Epithelial PRESENT (A) NONE SEEN   WBC Clumps PRESENT     Comment: Performed at [facility], [place] [address], [place], [place] [address]  CBG monitoring, ED     Status: Abnormal   Collection Time: 11/28/18  8:24 AM  Result Value Ref Range   Glucose-Capillary 497 (H) 70 - 99 mg/dL   Ct Abdomen Pelvis Wo Contrast  Result Date: 11/28/2018 CLINICAL DATA:  Abdominal pain, nausea, vomiting. EXAM: CT ABDOMEN AND PELVIS WITHOUT CONTRAST TECHNIQUE: Multidetector CT imaging of the abdomen and pelvis was performed following the standard protocol without IV contrast. COMPARISON:  06/18/2018 FINDINGS: Lower chest: Lung bases are clear. No effusions. Heart is normal size. Calcifications in the visualized right coronary artery. Hepatobiliary: No focal liver abnormality is seen. Status post cholecystectomy. No biliary dilatation. Pancreas: Calcifications throughout the pancreas compatible with chronic pancreatitis. Pancreatic atrophy. Findings are stable. Spleen: Splenomegaly with craniocaudal length measuring 18.8 cm, stable. Adrenals/Urinary Tract: Adrenal glands normal. Right renal pelvic stone measures 13 mm, stable. There is extensive bilateral perinephric stranding, similar to prior study. Areas of low-density noted throughout the left kidney which were not definitively seen on prior study. No hydronephrosis. Urinary bladder grossly unremarkable. Small amount of gas in the urinary bladder, presumably from catheterization. Stomach/Bowel: Stomach, large and small bowel grossly unremarkable. Vascular/Lymphatic: No evidence of aneurysm or adenopathy. Reproductive: No visible focal abnormality. Other: No free fluid or free air. Musculoskeletal: No acute bony abnormality. IMPRESSION:  Extensive perinephric stranding is similar prior study. There are areas of ill-defined low-density throughout the left kidney which were not definitively seen on prior study. Cannot exclude areas of infection/pyelonephritis. Recommend clinical correlation. 13 mm right renal pelvic stone. No hydronephrosis. Changes of chronic pancreatitis with calcifications and pancreatic atrophy. Stable splenomegaly. Electronically Signed   By: Rolm Baptise M.D.   On: 11/28/2018 01:38   Dg Chest Port 1 View  Result Date: 11/28/2018 CLINICAL DATA:  Chest pain and abnormal breath sounds. EXAM: PORTABLE CHEST 1 VIEW COMPARISON:  08/03/2018 FINDINGS: Right IJ dialysis catheter unchanged. Lungs are adequately inflated without lobar consolidation or effusion. Cardiomediastinal silhouette and remainder of the exam is unchanged. IMPRESSION: No active disease. Electronically Signed   By: Marin Olp M.D.   On: 11/28/2018 07:54    Pending Labs Unresulted Labs (From admission, onward)    Start     Ordered   11/29/18 9924  Basic metabolic panel  Tomorrow morning,   R     11/28/18 0722   11/28/18 2683  Basic metabolic panel  Once,   STAT     11/28/18 0722   11/28/18 0722  Magnesium  Add-on,   AD     11/28/18 0722   11/28/18 0645  SARS CORONAVIRUS 2 (TAT 6-24 HRS) Nasopharyngeal Nasopharyngeal Swab  (Asymptomatic/Tier 2)  ONCE - STAT,   STAT    Question Answer Comment  Is this test for diagnosis or screening Screening   Symptomatic for COVID-19 as defined by CDC No   Hospitalized for COVID-19 No   Admitted to ICU for COVID-19 No   Previously tested for COVID-19 Yes  Resident in a congregate (group) care setting Yes   Employed in healthcare setting No      11/28/18 0644   11/28/18 0415  Urine culture  ONCE - STAT,   STAT     11/28/18 0414   11/28/18 0415  Blood culture (routine x 2)  BLOOD CULTURE X 2,   STAT     11/28/18 0414          Vitals/Pain Today's Vitals   11/28/18 0645 11/28/18 0719 11/28/18 0815  11/28/18 0830  BP: (!) 154/71 (!) 157/79 118/80 (!) 141/78  Pulse: 90 90 87 88  Resp: 18 20 18 19   Temp:      TempSrc:      SpO2: 100% 100% 92% 94%  Weight:      Height:      PainSc:        Isolation Precautions No active isolations  Medications Medications  meropenem (MERREM) 500 mg in sodium chloride 0.9 % 100 mL IVPB (0 mg Intravenous Stopped 11/28/18 0636)  morphine 2 MG/ML injection 1 mg (has no administration in time range)  ondansetron (ZOFRAN) injection 4 mg (has no administration in time range)  acetaminophen (TYLENOL) tablet 650 mg (has no administration in time range)  allopurinol (ZYLOPRIM) tablet 100 mg (has no administration in time range)  amLODipine (NORVASC) tablet 5 mg (has no administration in time range)  cloNIDine (CATAPRES) tablet 0.1 mg (has no administration in time range)  simvastatin (ZOCOR) tablet 20 mg (has no administration in time range)  PHENobarbital (LUMINAL) tablet 259.2 mg (has no administration in time range)  Basaglar KwikPen KwikPen 24 Units (has no administration in time range)  calcium acetate (PHOSLO) capsule 2,001 mg (has no administration in time range)  lactulose (CHRONULAC) 10 GM/15ML solution 30 g (has no administration in time range)  pantoprazole (PROTONIX) EC tablet 20 mg (has no administration in time range)  tamsulosin (FLOMAX) capsule 0.4 mg (has no administration in time range)  ferrous sulfate tablet 325 mg (has no administration in time range)  carbamazepine (TEGRETOL) tablet 800 mg (has no administration in time range)  feeding supplement (PRO-STAT SUGAR FREE 64) liquid 30 mL (has no administration in time range)  cholecalciferol (VITAMIN D3) tablet 2,000 Units (has no administration in time range)  multivitamin with minerals tablet 1 tablet (has no administration in time range)  feeding supplement (NEPRO CARB STEADY) liquid 237 mL (has no administration in time range)  heparin injection 5,000 Units (5,000 Units Subcutaneous  Given 11/28/18 0900)  insulin aspart (novoLOG) injection 0-5 Units (has no administration in time range)  insulin aspart (novoLOG) injection 0-15 Units (has no administration in time range)  insulin glargine (LANTUS) injection 13 Units (has no administration in time range)  sodium chloride flush (NS) 0.9 % injection 3 mL (3 mLs Intravenous Given 11/28/18 0150)  ondansetron (ZOFRAN-ODT) disintegrating tablet 4 mg (4 mg Oral Given 11/27/18 2308)  ondansetron (ZOFRAN) injection 4 mg (4 mg Intravenous Given 11/28/18 0150)  morphine 4 MG/ML injection 4 mg (4 mg Intravenous Given 11/28/18 0150)  ondansetron (ZOFRAN) injection 4 mg (4 mg Intravenous Given 11/28/18 0444)  insulin aspart (novoLOG) injection 10 Units (10 Units Subcutaneous Given 11/28/18 0859)    Mobility non-ambulatory High fall risk   Focused Assessments Cardiac Assessment Handoff:  Cardiac Rhythm: Normal sinus rhythm Lab Results  Component Value Date   CKTOTAL 26 (L) 04/24/2018   No results found for: DDIMER Does the Patient currently have chest pain? No  R Recommendations: See Admitting Provider Note  Report given to:   Additional Notes:

## 2018-11-28 NOTE — Plan of Care (Signed)
  Problem: Nutrition: Goal: Adequate nutrition will be maintained Outcome: Progressing   Problem: Pain Managment: Goal: General experience of comfort will improve Outcome: Progressing   Problem: Safety: Goal: Ability to remain free from injury will improve Outcome: Progressing   

## 2018-11-28 NOTE — ED Notes (Signed)
Dr. Marlowe Sax paged regarding hyperglycemia

## 2018-11-28 NOTE — Progress Notes (Signed)
Patient admitted to unit from ED, VSS. Patient settled in bed and oriented to unit and hospital routine. Pt resting in bed with call bell in reach and side rails up. Instructed patient to utilize call bell for assistance. Will continue to monitor closely for remainder of shift.

## 2018-11-28 NOTE — Progress Notes (Signed)
Patient transported off unit to dialysis.

## 2018-11-28 NOTE — Progress Notes (Signed)
Patient noon FSGB 356, patient received 15 units of novolog at 1052. Patient not wanting to eat, still having nausea, zofran was given. Spoke with MD. Will recheck blood glucose in 30 minutes and will correct according to sliding scale at this time per MD. Will continue to monitor patient closely for remainder of shift.

## 2018-11-28 NOTE — Consult Note (Addendum)
[facility] Renal Consultation Note    Indication for Consultation:  Management of ESRD/hemodialysis, anemia, hypertension/volume, and secondary hyperparathyroidism.  HPI: Eddie Davies is a 48 y.o. male with a PMH including ESRD on dialysis, cirrhosis, T2DM, and HTN, who presented to the ED this AM with abdominal pain. He reported abdominal pain, nausea and vomiting since Saturday. Reports he was not able to go to dialysis on Monday due to the pain. Pain primarily in the L flank. In the ED, CT concerning for persistent stranding of left kidney with fluid collections concerning for pyelonephritis UA consistent with UTI. Culture sent. No leukocytosis. Blood cultures were ordered and patient was started on meropenem, morphine and zofran. T 98.31F, Pulse 88, BP 148/78. K+ 3.1, BUN 66, Cr 7.61, WBC 8.2, hgb 8.8. Chest x-ray with no active disease.   At present, patient reports ongoing abdominal pain and nausea. Also reports hiccups since this AM. Reports vomiting once this AM. Denies constipation and diarrhea. He denies dysuria but tells me he does not make much urine. He denies SOB, dyspnea, cough, CP, palpitations, and peripheral edema. Outpatient HD notes were reviewed. Pt tends to have large IDWG but is generally complaint with treatments. Weight today is 3.4kg above outpatient EDW.   Past Medical History:  Diagnosis Date  . Cirrhosis ([place]) 04/2018  . Diabetes mellitus without complication (HCC)    type II  . ESRD (end stage renal disease) ([place])    MWF   . Hypertension   . Liver failure ([place])   . Metabolic acidosis 17/0017  . Pneumonia    Past Surgical History:  Procedure Laterality Date  . AV FISTULA PLACEMENT Right 10/23/2018   Procedure: ARTERIOVENOUS (AV) FISTULA CREATION RIGHT ARM;  Surgeon: [name], MD;  Location: [place];  Service: Vascular;  Laterality: Right;  . CHOLECYSTECTOMY    . IR FLUORO GUIDE CV LINE RIGHT  05/30/2018  . IR US GUIDE VASC ACCESS  RIGHT  05/30/2018   History reviewed. No pertinent family history. Social History:  reports that he has been smoking cigarettes. He has been smoking about 0.25 packs per day. He has never used smokeless tobacco. He reports previous alcohol use. He reports that he does not use drugs.  ROS: As per HPI otherwise negative.  Physical Exam: Vitals:   11/28/18 0719 11/28/18 0815 11/28/18 0830 11/28/18 0937  BP: (!) 157/79 118/80 (!) 141/78 (!) 148/78  Pulse: 90 87 88 88  Resp: 20 18 19 18   Temp:    98.6 F (37 C)  TempSrc:    Oral  SpO2: 100% 92% 94% 100%  Weight:      Height:    6' 2"  (1.88 m)     General: Well developed, well nourished, in NAD Head: Normocephalic, atraumatic, sclera non-icteric, mucus membranes are moist. Neck: JVD not elevated. Lungs: Clear bilaterally to auscultation without wheezes, rales, or rhonchi. Breathing is unlabored on RA. Heart: RRR with normal S1, S2. No murmurs, rubs, or gallops appreciated. Abdomen: Soft, diffuse tenderness LUQ and LLE, no rebound tenderness. Normoactive BS Musculoskeletal:  Strength and tone appear normal for age. Lower extremities: Trace edema b/l lower extremities Neuro: Alert and oriented X 3. Moves all extremities spontaneously. Psych:  Responds to questions appropriately with a normal affect. Dialysis Access: RIJ TDC without erythema/drainage. RUE maturing AVF+ bruit  Allergies  Allergen Reactions  . Fish Oil Swelling and Other (See Comments)        Prior to Admission medications   Medication Sig  Start Date End Date Taking? Authorizing Provider  acetaminophen (TYLENOL) 325 MG tablet Take 650 mg by mouth every 8 (eight) hours as needed for fever (pain).   Yes [provider]  allopurinol (ZYLOPRIM) 100 MG tablet Take 100 mg by mouth daily.  08/01/18  Yes [provider]  Amino Acids-Protein Hydrolys (FEEDING SUPPLEMENT, PRO-STAT SUGAR FREE 64,) LIQD Take 30 mLs by mouth 2 (two) times a day.   Yes [provider]  amLODipine (NORVASC) 5 MG tablet Take 5 mg by mouth at bedtime.   Yes [provider]  calcium acetate (PHOSLO) 667 MG capsule Take 2,001 mg by mouth 3 (three) times daily with meals.    Yes [provider]  carbamazepine (TEGRETOL) 200 MG tablet Take 800 mg by mouth 2 (two) times daily.   Yes [provider]  Cholecalciferol (VITAMIN D) 50 MCG (2000 UT) tablet Take 2,000 Units by mouth daily.   Yes [provider]  cloNIDine (CATAPRES) 0.1 MG tablet Take 0.1 mg by mouth 2 (two) times daily as needed (SBP ABOVE 170).  07/25/18  Yes [provider]  ferrous sulfate 325 (65 FE) MG tablet Take 325 mg by mouth daily with breakfast.   Yes [provider]  guaiFENesin (MUCINEX) 600 MG 12 hr tablet Take 600 mg by mouth every 12 (twelve) hours as needed for cough (congestion).   Yes [provider]  HYDROcodone-acetaminophen (NORCO) 5-325 MG tablet Take 1 tablet by mouth every 6 (six) hours as needed for moderate pain. 10/23/18  Yes Dagoberto Ligas, [name]  insulin aspart (NOVOLOG) 100 UNIT/ML injection Inject 0-15 Units into the skin 4 (four) times daily -  with meals and at bedtime. 70-120=0 units, 121-150=2 units, 151-200=3 units, 201-250=5 units, 251-300=8 units, 301-350=11 units, 351-400=15 units, >400 call MD   Yes [provider]  Insulin Glargine (BASAGLAR KWIKPEN) 100 UNIT/ML SOPN Inject 0.13 mLs (13 Units total) into the skin at bedtime. Patient taking differently: Inject 24 Units into the skin at bedtime.  05/31/18  Yes [name], [name] K, MD  lactulose (CHRONULAC) 10 GM/15ML solution Take 15 mLs (10 g total) by mouth 2 (two) times daily. Patient taking differently: Take 30 g by mouth 2 (two) times daily.  05/31/18  Yes [name], [name] K, MD  lidocaine-prilocaine (EMLA) cream Apply 1 application topically every Monday, Wednesday, and Friday.   Yes [provider]  Multiple Vitamin (MULTIVITAMIN WITH MINERALS) TABS  tablet Take 1 tablet by mouth at bedtime.   Yes [provider]  Nutritional Supplements (FEEDING SUPPLEMENT, NEPRO CARB STEADY,) LIQD Take 237 mLs by mouth 2 (two) times daily between meals. 04/30/18  Yes [name], [name], MD  omeprazole (PRILOSEC) 20 MG capsule Take 20 mg by mouth daily.  07/04/18  Yes [provider]  ondansetron (ZOFRAN) 4 MG tablet Take 4 mg by mouth every 8 (eight) hours as needed for nausea or vomiting.  08/03/18  Yes [provider]  PHENobarbital (LUMINAL) 32.4 MG tablet Take 7 tablets (226.8 mg total) by mouth at bedtime. Patient taking differently: Take 259.2 mg by mouth at bedtime. 4 TABLETS 05/31/18  Yes [name], [name] K, MD  simvastatin (ZOCOR) 20 MG tablet Take 20 mg by mouth at bedtime.    Yes [provider]  tamsulosin (FLOMAX) 0.4 MG CAPS capsule Take 0.4 mg by mouth at bedtime.   Yes [provider]  TRADJENTA 5 MG TABS tablet Take 5 mg by mouth daily.  10/16/18  Yes [provider]  Darbepoetin Alfa (ARANESP) 200 MCG/0.4ML SOSY injection Inject 0.4 mLs (200 mcg total) into the vein every Thursday with hemodialysis. Patient not taking: Reported on 11/28/2018 06/28/18   Damita Lack, MD   Current Facility-Administered Medications  Medication Dose Route Frequency Provider Last Rate Last Dose  . acetaminophen (TYLENOL) tablet 650 mg  650 mg Oral Q8H PRN Shela Leff, MD      . allopurinol (ZYLOPRIM) tablet 100 mg  100 mg Oral Daily Shela Leff, MD      . amLODipine (NORVASC) tablet 5 mg  5 mg Oral QHS Shela Leff, MD      . calcium acetate (PHOSLO) capsule 2,001 mg  2,001 mg Oral TID WC Shela Leff, MD      . carbamazepine (TEGRETOL) tablet 800 mg  800 mg Oral BID Shela Leff, MD      . cholecalciferol (VITAMIN D3) tablet 2,000 Units  2,000 Units Oral Daily Shela Leff, MD      . cloNIDine (CATAPRES) tablet 0.1 mg  0.1 mg Oral BID PRN Shela Leff, MD      . feeding  supplement (NEPRO CARB STEADY) liquid 237 mL  237 mL Oral BID BM Shela Leff, MD      . feeding supplement (PRO-STAT SUGAR FREE 64) liquid 30 mL  30 mL Oral BID Shela Leff, MD      . ferrous sulfate tablet 325 mg  325 mg Oral Q breakfast Shela Leff, MD      . heparin injection 5,000 Units  5,000 Units Subcutaneous Q8H Shela Leff, MD   5,000 Units at 11/28/18 0900  . insulin aspart (novoLOG) injection 0-15 Units  0-15 Units Subcutaneous TID WC [name], [name] K, MD      . insulin aspart (novoLOG) injection 0-5 Units  0-5 Units Subcutaneous QHS [name], [name] K, MD      . insulin glargine (LANTUS) injection 13 Units  13 Units Subcutaneous Daily [name], [name] K, MD      . insulin glargine (LANTUS) injection 24 Units  24 Units Subcutaneous QHS Shela Leff, MD      . lactulose (CHRONULAC) 10 GM/15ML solution 30 g  30 g Oral BID Shela Leff, MD      . meropenem (MERREM) 500 mg in sodium chloride 0.9 % 100 mL IVPB  500 mg Intravenous Q24H Shela Leff, MD   Stopped at 11/28/18 0636  . morphine 2 MG/ML injection 1 mg  1 mg Intravenous Q4H PRN Shela Leff, MD      . multivitamin with minerals tablet 1 tablet  1 tablet Oral QHS Shela Leff, MD      . ondansetron (ZOFRAN) injection 4 mg  4 mg Intravenous Q6H PRN Shela Leff, MD      . pantoprazole (PROTONIX) EC tablet 20 mg  20 mg Oral Daily Shela Leff, MD      . PHENobarbital (LUMINAL) tablet 259.2 mg  259.2 mg Oral QHS Shela Leff, MD      . simvastatin (ZOCOR) tablet 20 mg  20 mg Oral QHS Shela Leff, MD      . tamsulosin (FLOMAX) capsule 0.4 mg  0.4 mg Oral QHS Shela Leff, MD       Labs: Basic Metabolic Panel: Recent Labs  Lab 11/27/18 2019  NA 135  K 3.1*  CL 100  CO2 19*  GLUCOSE 336*  BUN 66*  CREATININE 7.61*  CALCIUM 7.9*   Liver Function Tests: Recent Labs  Lab 11/27/18 2019  AST 17  ALT 14  ALKPHOS 125  BILITOT 0.7  PROT 6.5   ALBUMIN 2.7*   Recent Labs  Lab 11/27/18 2019  LIPASE 11   CBC: Recent Labs  Lab 11/27/18 2019  WBC 8.2  HGB 8.8*  HCT 26.1*  MCV 93.5  PLT 141*   CBG: Recent Labs  Lab 11/28/18 0824  GLUCAP 497*   Studies/Results: Ct Abdomen Pelvis Wo Contrast  Result Date: 11/28/2018 CLINICAL DATA:  Abdominal pain, nausea, vomiting. EXAM: CT ABDOMEN AND PELVIS WITHOUT CONTRAST TECHNIQUE: Multidetector CT imaging of the abdomen and pelvis was performed following the standard protocol without IV contrast. COMPARISON:  06/18/2018 FINDINGS: Lower chest: Lung bases are clear. No effusions. Heart is normal size. Calcifications in the visualized right coronary artery. Hepatobiliary: No focal liver abnormality is seen. Status post cholecystectomy. No biliary dilatation. Pancreas: Calcifications throughout the pancreas compatible with chronic pancreatitis. Pancreatic atrophy. Findings are stable. Spleen: Splenomegaly with craniocaudal length measuring 18.8 cm, stable. Adrenals/Urinary Tract: Adrenal glands normal. Right renal pelvic stone measures 13 mm, stable. There is extensive bilateral perinephric stranding, similar to prior study. Areas of low-density noted throughout the left kidney which were not definitively seen on prior study. No hydronephrosis. Urinary bladder grossly unremarkable. Small amount of gas in the urinary bladder, presumably from catheterization. Stomach/Bowel: Stomach, large and small bowel grossly unremarkable. Vascular/Lymphatic: No evidence of aneurysm or adenopathy. Reproductive: No visible focal abnormality. Other: No free fluid or free air. Musculoskeletal: No acute bony abnormality. IMPRESSION: Extensive perinephric stranding is similar prior study. There are areas of ill-defined low-density throughout the left kidney which were not definitively seen on prior study. Cannot exclude areas of infection/pyelonephritis. Recommend clinical correlation. 13 mm right renal pelvic stone. No  hydronephrosis. Changes of chronic pancreatitis with calcifications and pancreatic atrophy. Stable splenomegaly. Electronically Signed   By: Rolm Baptise M.D.   On: 11/28/2018 01:38   Dg Chest Port 1 View  Result Date: 11/28/2018 CLINICAL DATA:  Chest pain and abnormal breath sounds. EXAM: PORTABLE CHEST 1 VIEW COMPARISON:  08/03/2018 FINDINGS: Right IJ dialysis catheter unchanged. Lungs are adequately inflated without lobar consolidation or effusion. Cardiomediastinal silhouette and remainder of the exam is unchanged. IMPRESSION: No active disease. Electronically Signed   By: Marin Olp M.D.   On: 11/28/2018 07:54    Dialysis Orders: Center: [facility]  on MWF. Time: 4h 43mn, 180NRe, BFR 400, DFR 800, EDW 105.5kg, 3K, 2.25Ca, TDC Heparin 3000 units Mircera 1568m IV q 2 weeks- last dose 11/21/2018 Binder: calcium acetate 4 caps TID AC and 2 with snacks  Assessment/Plan: 1.  Acute pyelonephritis: CT concerning for left pyelonephritis and with nonobstructive right renal pelvic stone. Afebrile and without leukocytosis. History of klebsiella UTI/bacteremia in 05/2018. On Meropenem, urine and blood cultures pending. On zofran and morning prn nausea/pain. Per primary.  2.  ESRD:  MWF, missed treatment 11/2 due to pain/nausea. Will plan for HD today. K+ 3.1, use 4K bath.  3.  Hypertension/volume: Mildly hypertensive and 3.4kg above EDW. CXR clear, no peripheral edema on exam. Plan for HD today with UF goal 3L. Continue home amlodipine and clonidine.  4.  Anemia: hemoglobin 8.8, not due for ESA yet. Hold IV Fe in the setting of sepsis. Follow hemoglobin.  5.  Metabolic bone disease: Calcium 8.9 corrected. Last outpatient phos 8.4. Continue calcium acetate binder.  6.  Nutrition:  Renal/carb modified diet as tolerated. Continue protein supplement.  7. T2DM: On insulin, per primary.   SaAnice PaganiniPA-C 11/28/2018, 9:41 AM  [facility] Pager: [phone]  I have  seen and examined this patient and agree with plan and assessment in the above note with renal recommendations/intervention highlighted.  Pt not feeling well since Friday.  Hemodynamically stable.  Cont with HD qMWF while he remains and inpatient. Hgb dropping will cont to follow and transfuse prn. [place] John A Shannah Conteh,MD 11/28/2018 2:13 PM

## 2018-11-28 NOTE — ED Notes (Signed)
Patient transported to CT 

## 2018-11-28 NOTE — H&P (Signed)
History and Physical    Eddie Davies YCX:[id number] DOB: 1970-10-10 DOA: 11/27/2018  PCP: [facility] coming from: Nursing home  Chief Complaint: Abdominal pain  HPI: Eddie Davies is a 48 y.o. male with medical history significant of type 2 diabetes, liver cirrhosis, ESRD on HD MWF, hypertension, history of ESBL/Klebsiella UTI presenting with a chief complaint of abdominal/flank pain. Patient reports 4-day history of left-sided abdominal pain and flank pain.  He has been vomiting for the past 1 day and has not been able to keep any food down.  No diarrhea.  No dysuria, urinary frequency, or urgency.  No fevers or chills.  States he missed dialysis on Monday as he was not feeling well.  Denies cough or shortness of breath.  No other complaints.  ED Course: Afebrile hemodynamically stable.  No signs of sepsis.  No leukocytosis.  Labs with mild hypokalemia and hyperglycemia.  UA incomplete due to color interference of urine pigment.  However, microscopy with greater than 50 WBCs and many bacteria.  Blood culture x2 pending.  Urine culture pending.  CT abdomen pelvis with findings concerning for left renal pyelonephritis and a 13 mm right renal pelvic stone.  No hydronephrosis. Received morphine, Zofran, and meropenem.  Review of Systems:  All systems reviewed and apart from history of presenting illness, are negative.  Past Medical History:  Diagnosis Date  . Cirrhosis ([place]) 04/2018  . Diabetes mellitus without complication (HCC)    type II  . ESRD (end stage renal disease) ([place])    MWF   . Hypertension   . Liver failure ([place])   . Metabolic acidosis 49/7026  . Pneumonia     Past Surgical History:  Procedure Laterality Date  . AV FISTULA PLACEMENT Right 10/23/2018   Procedure: ARTERIOVENOUS (AV) FISTULA CREATION RIGHT ARM;  Surgeon: [name], MD;  Location: [facility];  Service: Vascular;  Laterality: Right;  . CHOLECYSTECTOMY    . IR  FLUORO GUIDE CV LINE RIGHT  05/30/2018  . IR US GUIDE VASC ACCESS RIGHT  05/30/2018     reports that he has been smoking cigarettes. He has been smoking about 0.25 packs per day. He has never used smokeless tobacco. He reports previous alcohol use. He reports that he does not use drugs.  Allergies  Allergen Reactions  . Fish Oil Swelling and Other (See Comments)         History reviewed. No pertinent family history.  Prior to Admission medications   Medication Sig Start Date End Date Taking? Authorizing Provider  acetaminophen (TYLENOL) 325 MG tablet Take 650 mg by mouth every 8 (eight) hours as needed for fever (pain).   Yes [provider]  allopurinol (ZYLOPRIM) 100 MG tablet Take 100 mg by mouth daily.  08/01/18  Yes [provider]  Amino Acids-Protein Hydrolys (FEEDING SUPPLEMENT, PRO-STAT SUGAR FREE 64,) LIQD Take 30 mLs by mouth 2 (two) times a day.   Yes [provider]  amLODipine (NORVASC) 5 MG tablet Take 5 mg by mouth at bedtime.   Yes [provider]  calcium acetate (PHOSLO) 667 MG capsule Take 2,001 mg by mouth 3 (three) times daily with meals.    Yes [provider]  carbamazepine (TEGRETOL) 200 MG tablet Take 800 mg by mouth 2 (two) times daily.   Yes [provider]  Cholecalciferol (VITAMIN D) 50 MCG (2000 UT) tablet Take 2,000 Units by mouth daily.   Yes [provider]  cloNIDine (CATAPRES)  0.1 MG tablet Take 0.1 mg by mouth 2 (two) times daily as needed (SBP ABOVE 170).  07/25/18  Yes [provider]  ferrous sulfate 325 (65 FE) MG tablet Take 325 mg by mouth daily with breakfast.   Yes [provider]  guaiFENesin (MUCINEX) 600 MG 12 hr tablet Take 600 mg by mouth every 12 (twelve) hours as needed for cough (congestion).   Yes [provider]  HYDROcodone-acetaminophen (NORCO) 5-325 MG tablet Take 1 tablet by mouth every 6 (six) hours as needed for moderate pain. 10/23/18  Yes Dagoberto Ligas, [name]  insulin aspart (NOVOLOG) 100 UNIT/ML injection Inject 0-15 Units into the skin 4 (four) times daily -  with meals and at bedtime. 70-120=0 units, 121-150=2 units, 151-200=3 units, 201-250=5 units, 251-300=8 units, 301-350=11 units, 351-400=15 units, >400 call MD   Yes [provider]  Insulin Glargine (BASAGLAR KWIKPEN) 100 UNIT/ML SOPN Inject 0.13 mLs (13 Units total) into the skin at bedtime. Patient taking differently: Inject 24 Units into the skin at bedtime.  05/31/18  Yes [name], [name] K, MD  lactulose (CHRONULAC) 10 GM/15ML solution Take 15 mLs (10 g total) by mouth 2 (two) times daily. Patient taking differently: Take 30 g by mouth 2 (two) times daily.  05/31/18  Yes [name], [name] K, MD  lidocaine-prilocaine (EMLA) cream Apply 1 application topically every Monday, Wednesday, and Friday.   Yes [provider]  Multiple Vitamin (MULTIVITAMIN WITH MINERALS) TABS tablet Take 1 tablet by mouth at bedtime.   Yes [provider]  Nutritional Supplements (FEEDING SUPPLEMENT, NEPRO CARB STEADY,) LIQD Take 237 mLs by mouth 2 (two) times daily between meals. 04/30/18  Yes [name], [name], MD  omeprazole (PRILOSEC) 20 MG capsule Take 20 mg by mouth daily.  07/04/18  Yes [provider]  ondansetron (ZOFRAN) 4 MG tablet Take 4 mg by mouth every 8 (eight) hours as needed for nausea or vomiting.  08/03/18  Yes [provider]  PHENobarbital (LUMINAL) 32.4 MG tablet Take 7 tablets (226.8 mg total) by mouth at bedtime. Patient taking differently: Take 259.2 mg by mouth at bedtime. 4 TABLETS 05/31/18  Yes [name], [name] K, MD  simvastatin (ZOCOR) 20 MG tablet Take 20 mg by mouth at bedtime.    Yes [provider]  tamsulosin (FLOMAX) 0.4 MG CAPS capsule Take 0.4 mg by mouth at bedtime.   Yes [provider]  TRADJENTA 5 MG TABS tablet Take 5 mg by mouth daily.  10/16/18  Yes [provider]  Darbepoetin Alfa (ARANESP) 200 MCG/0.4ML  SOSY injection Inject 0.4 mLs (200 mcg total) into the vein every Thursday with hemodialysis. Patient not taking: Reported on 11/28/2018 06/28/18   Damita Lack, MD    Physical Exam: Vitals:   11/28/18 0545 11/28/18 0630 11/28/18 0645 11/28/18 0719  BP: (!) 148/77 (!) 150/75 (!) 154/71 (!) 157/79  Pulse: 92 90 90 90  Resp: 17 19 18 20   Temp:      TempSrc:      SpO2: 96% 92% 100% 100%  Weight:      Height:        Physical Exam  Constitutional: He is oriented to person, place, and time. He appears well-developed and well-nourished.  HENT:  Head: Normocephalic.  Eyes: Right eye exhibits no discharge. Left eye exhibits no discharge.  Neck: Neck supple.  Cardiovascular: Normal rate, regular rhythm and intact distal pulses.  Pulmonary/Chest: Effort normal and breath sounds normal. No respiratory distress. He has no  wheezes. He has no rales.  Abdominal: Soft. Bowel sounds are normal. He exhibits no distension. There is abdominal tenderness. There is no rebound and no guarding.  Left upper and lower quadrants tender to palpation  Musculoskeletal:        General: No edema.  Neurological: He is alert and oriented to person, place, and time.  Skin: Skin is warm and dry.     Labs on Admission: I have personally reviewed following labs and imaging studies  CBC: Recent Labs  Lab 11/27/18 2019  WBC 8.2  HGB 8.8*  HCT 26.1*  MCV 93.5  PLT 163*   Basic Metabolic Panel: Recent Labs  Lab 11/27/18 2019  NA 135  K 3.1*  CL 100  CO2 19*  GLUCOSE 336*  BUN 66*  CREATININE 7.61*  CALCIUM 7.9*   GFR: Estimated Creatinine Clearance: 15.6 mL/min (A) (by C-G formula based on SCr of 7.61 mg/dL (H)). Liver Function Tests: Recent Labs  Lab 11/27/18 2019  AST 17  ALT 14  ALKPHOS 125  BILITOT 0.7  PROT 6.5  ALBUMIN 2.7*   Recent Labs  Lab 11/27/18 2019  LIPASE 11   No results for input(s): AMMONIA in the last 168 hours. Coagulation Profile: No results for input(s):  INR, PROTIME in the last 168 hours. Cardiac Enzymes: No results for input(s): CKTOTAL, CKMB, CKMBINDEX, TROPONINI in the last 168 hours. BNP (last 3 results) No results for input(s): PROBNP in the last 8760 hours. HbA1C: No results for input(s): HGBA1C in the last 72 hours. CBG: No results for input(s): GLUCAP in the last 168 hours. Lipid Profile: No results for input(s): CHOL, HDL, LDLCALC, TRIG, CHOLHDL, LDLDIRECT in the last 72 hours. Thyroid Function Tests: No results for input(s): TSH, T4TOTAL, FREET4, T3FREE, THYROIDAB in the last 72 hours. Anemia Panel: No results for input(s): VITAMINB12, FOLATE, FERRITIN, TIBC, IRON, RETICCTPCT in the last 72 hours. Urine analysis:    Component Value Date/Time   COLORURINE RED (A) 11/28/2018 0431   APPEARANCEUR TURBID (A) 11/28/2018 0431   LABSPEC  11/28/2018 0431    TEST NOT REPORTED DUE TO COLOR INTERFERENCE OF URINE PIGMENT   PHURINE  11/28/2018 0431    TEST NOT REPORTED DUE TO COLOR INTERFERENCE OF URINE PIGMENT   GLUCOSEU (A) 11/28/2018 0431    TEST NOT REPORTED DUE TO COLOR INTERFERENCE OF URINE PIGMENT   HGBUR (A) 11/28/2018 0431    TEST NOT REPORTED DUE TO COLOR INTERFERENCE OF URINE PIGMENT   BILIRUBINUR (A) 11/28/2018 0431    TEST NOT REPORTED DUE TO COLOR INTERFERENCE OF URINE PIGMENT   KETONESUR (A) 11/28/2018 0431    TEST NOT REPORTED DUE TO COLOR INTERFERENCE OF URINE PIGMENT   PROTEINUR (A) 11/28/2018 0431    TEST NOT REPORTED DUE TO COLOR INTERFERENCE OF URINE PIGMENT   NITRITE (A) 11/28/2018 0431    TEST NOT REPORTED DUE TO COLOR INTERFERENCE OF URINE PIGMENT   LEUKOCYTESUR (A) 11/28/2018 0431    TEST NOT REPORTED DUE TO COLOR INTERFERENCE OF URINE PIGMENT    Radiological Exams on Admission: Ct Abdomen Pelvis Wo Contrast  Result Date: 11/28/2018 CLINICAL DATA:  Abdominal pain, nausea, vomiting. EXAM: CT ABDOMEN AND PELVIS WITHOUT CONTRAST TECHNIQUE: Multidetector CT imaging of the abdomen and pelvis was performed  following the standard protocol without IV contrast. COMPARISON:  06/18/2018 FINDINGS: Lower chest: Lung bases are clear. No effusions. Heart is normal size. Calcifications in the visualized right coronary artery. Hepatobiliary: No focal liver abnormality is seen. Status post  cholecystectomy. No biliary dilatation. Pancreas: Calcifications throughout the pancreas compatible with chronic pancreatitis. Pancreatic atrophy. Findings are stable. Spleen: Splenomegaly with craniocaudal length measuring 18.8 cm, stable. Adrenals/Urinary Tract: Adrenal glands normal. Right renal pelvic stone measures 13 mm, stable. There is extensive bilateral perinephric stranding, similar to prior study. Areas of low-density noted throughout the left kidney which were not definitively seen on prior study. No hydronephrosis. Urinary bladder grossly unremarkable. Small amount of gas in the urinary bladder, presumably from catheterization. Stomach/Bowel: Stomach, large and small bowel grossly unremarkable. Vascular/Lymphatic: No evidence of aneurysm or adenopathy. Reproductive: No visible focal abnormality. Other: No free fluid or free air. Musculoskeletal: No acute bony abnormality. IMPRESSION: Extensive perinephric stranding is similar prior study. There are areas of ill-defined low-density throughout the left kidney which were not definitively seen on prior study. Cannot exclude areas of infection/pyelonephritis. Recommend clinical correlation. 13 mm right renal pelvic stone. No hydronephrosis. Changes of chronic pancreatitis with calcifications and pancreatic atrophy. Stable splenomegaly. Electronically Signed   By: Rolm Baptise M.D.   On: 11/28/2018 01:38    EKG: Independently reviewed.  Sinus tachycardia, heart rate 106.  Left posterior fascicular block, incomplete right bundle branch block.  No significant change since prior tracing.  Assessment/Plan Principal Problem:   Pyelonephritis Active Problems:   End stage liver  disease (HCC)   BPH (benign prostatic hyperplasia)   Benign essential HTN   Diabetes mellitus type 2, uncontrolled, with complications (HCC)  Acute pyelonephritis, intractable nausea and vomiting Afebrile and hemodynamically stable.  No signs of sepsis.  No leukocytosis.  UA with evidence of pyuria and bacteriuria.  CT abdomen pelvis with findings concerning for left renal pyelonephritis.  History of Klebsiella UTI/bacteremia in May 2020. -Meropenem based on prior urine culture sensitivity results -Zofran as needed for nausea/vomiting -Morphine as needed for pain -Urine culture pending -Blood culture x2 pending -Check lactic acid level  Nonobstructing right renal pelvic stone CT with a 13 mm right renal pelvic stone, slightly increased in size compared to prior CT from May 2020.  No hydronephrosis. -Ensure urology follow-up  Hyperglycemia, insulin-dependent type 2 diabetes Blood glucose 336.  Has mild acidosis with bicarb 19 and anion gap borderline elevated at 16.  Unclear if he has urine ketones as results incomplete due to color interference of urine pigment. -Avoid IV fluid given he is a ESRD and missed his last dialysis session. -NovoLog 10 units at this time -Continue home Basaglar 24 units daily at bedtime -Sliding scale insulin sensitive with meals and CBG checks -Check beta hydroxybutyrate level  ESRD on HD MWF Patient missed dialysis on Monday.  No signs of respiratory distress.  Mild rales appreciated at the bases.  Does not appear overtly volume overloaded on exam otherwise.  Potassium 3.1. -Chest x-ray -Consult nephrology in a.m. for dialysis -Continue home renal supplements  History of seizures -Continue home phenobarbital and Tegretol  Hypertension -Continue home amlodipine and clonidine  Gout -Continue home allopurinol  End-stage liver disease -Continue home lactulose  GERD -Continue PPI  Hyperlipidemia -Continue Zocor  BPH -Continue Flomax  DVT  prophylaxis: Subcutaneous heparin Code Status: Full code Family Communication: No family available. Disposition Plan: Anticipate discharge after clinical improvement. Consults called: None Admission status: It is my clinical opinion that admission to INPATIENT is reasonable and necessary in this 48 y.o. male . presenting with intractable nausea and vomiting secondary to acute pyelonephritis.  On IV antibiotic.  Work-up pending.  High risk of decompensation.  Given the aforementioned, the predictability of  an adverse outcome is felt to be significant. I expect that the patient will require at least 2 midnights in the hospital to treat this condition.   The medical decision making on this patient was of high complexity and the patient is at high risk for clinical deterioration, therefore this is a level 3 visit.  Shela Leff MD [facility] Pager [phone]  If 7PM-7AM, please contact night-coverage [email or web address] Password [facility]  11/28/2018, 7:37 AM

## 2018-11-28 NOTE — ED Provider Notes (Signed)
[facility] Provider Note   CSN: [id number] Arrival date & time: 11/27/18  2001     History   Chief Complaint Chief Complaint  Patient presents with  . Abdominal Pain    HPI Eddie Davies is a 48 y.o. male.     HPI  This a 48 year old male with a history of cirrhosis, diabetes, end-stage renal disease on dialysis Monday Wednesday and Friday, hypertension who presents with abdominal pain and vomiting.  Patient reports onset of symptoms on Saturday.  He felt so poorly that he did not go to dialysis on Monday.  He reports multiple episodes of nonbilious, nonbloody emesis.  He reports left-sided abdominal pain.  He has had pain like this in the past but cannot tell me what was wrong with him.  He has had normal bowel movements and some diarrhea.  No blood in his stools.  He rates his pain at 10 out of 10.  Nothing seems to make it better or worse.  He states that he "maybe had blood in my urine."  Patient does have a history of bacteremia from Klebsiella UTI previously.  Also known to have kidney stones.  Past Medical History:  Diagnosis Date  . Cirrhosis ([place]) 04/2018  . Diabetes mellitus without complication (HCC)    type II  . ESRD (end stage renal disease) ([place])    MWF   . Hypertension   . Liver failure ([place])   . Metabolic acidosis 36/6440  . Pneumonia     Patient Active Problem List   Diagnosis Date Noted  . Pyelonephritis 11/28/2018  . Bacteremia due to Klebsiella pneumoniae 06/19/2018  . Fever 06/18/2018  . ESRD (end stage renal disease) on dialysis ([place]) 06/18/2018  . Anemia due to chronic kidney disease 06/18/2018  . Leukocytosis 06/18/2018  . Acute metabolic encephalopathy 34/74/2595  . Seizures ([place]) 05/24/2018  . BPH (benign prostatic hyperplasia) 05/24/2018  . Sepsis ([place]) 05/24/2018  . Benign essential HTN 05/24/2018  . HLD (hyperlipidemia) 05/24/2018  . Diabetes mellitus type 2, uncontrolled, with  complications ([place]) 63/87/5643  . Thrombocytopenia ([place]) 05/24/2018  . Confusion   . Metabolic acidosis 32/95/1884  . Respiratory failure ([place])   . Acute renal failure superimposed on stage 4 chronic kidney disease ([place])   . End stage liver disease ([place])   . Pressure injury of skin 04/21/2018  . Altered mental status, unspecified 04/21/2018  . HCAP (healthcare-associated pneumonia) 04/21/2018  . Acute renal failure (ARF) ([place]) 04/07/2018    Past Surgical History:  Procedure Laterality Date  . AV FISTULA PLACEMENT Right 10/23/2018   Procedure: ARTERIOVENOUS (AV) FISTULA CREATION RIGHT ARM;  Surgeon: [name], MD;  Location: [facility];  Service: Vascular;  Laterality: Right;  . CHOLECYSTECTOMY    . IR FLUORO GUIDE CV LINE RIGHT  05/30/2018  . IR US GUIDE VASC ACCESS RIGHT  05/30/2018        Home Medications    Prior to Admission medications   Medication Sig Start Date End Date Taking? Authorizing Provider  acetaminophen (TYLENOL) 325 MG tablet Take 650 mg by mouth every 8 (eight) hours as needed for fever (pain).   Yes [provider]  allopurinol (ZYLOPRIM) 100 MG tablet Take 100 mg by mouth daily.  08/01/18  Yes [provider]  Amino Acids-Protein Hydrolys (FEEDING SUPPLEMENT, PRO-STAT SUGAR FREE 64,) LIQD Take 30 mLs by mouth 2 (two) times a day.   Yes [provider]  amLODipine (NORVASC) 5 MG tablet  Take 5 mg by mouth at bedtime.   Yes [provider]  calcium acetate (PHOSLO) 667 MG capsule Take 2,001 mg by mouth 3 (three) times daily with meals.    Yes [provider]  carbamazepine (TEGRETOL) 200 MG tablet Take 800 mg by mouth 2 (two) times daily.   Yes [provider]  Cholecalciferol (VITAMIN D) 50 MCG (2000 UT) tablet Take 2,000 Units by mouth daily.   Yes [provider]  cloNIDine (CATAPRES) 0.1 MG tablet Take 0.1 mg by mouth 2 (two) times daily as needed (SBP ABOVE 170).  07/25/18  Yes [provider]   ferrous sulfate 325 (65 FE) MG tablet Take 325 mg by mouth daily with breakfast.   Yes [provider]  guaiFENesin (MUCINEX) 600 MG 12 hr tablet Take 600 mg by mouth every 12 (twelve) hours as needed for cough (congestion).   Yes [provider]  HYDROcodone-acetaminophen (NORCO) 5-325 MG tablet Take 1 tablet by mouth every 6 (six) hours as needed for moderate pain. 10/23/18  Yes Dagoberto Ligas, [name]  insulin aspart (NOVOLOG) 100 UNIT/ML injection Inject 0-15 Units into the skin 4 (four) times daily -  with meals and at bedtime. 70-120=0 units, 121-150=2 units, 151-200=3 units, 201-250=5 units, 251-300=8 units, 301-350=11 units, 351-400=15 units, >400 call MD   Yes [provider]  Insulin Glargine (BASAGLAR KWIKPEN) 100 UNIT/ML SOPN Inject 0.13 mLs (13 Units total) into the skin at bedtime. Patient taking differently: Inject 24 Units into the skin at bedtime.  05/31/18  Yes [name], [name] K, MD  lactulose (CHRONULAC) 10 GM/15ML solution Take 15 mLs (10 g total) by mouth 2 (two) times daily. Patient taking differently: Take 30 g by mouth 2 (two) times daily.  05/31/18  Yes [name], [name] K, MD  lidocaine-prilocaine (EMLA) cream Apply 1 application topically every Monday, Wednesday, and Friday.   Yes [provider]  Multiple Vitamin (MULTIVITAMIN WITH MINERALS) TABS tablet Take 1 tablet by mouth at bedtime.   Yes [provider]  Nutritional Supplements (FEEDING SUPPLEMENT, NEPRO CARB STEADY,) LIQD Take 237 mLs by mouth 2 (two) times daily between meals. 04/30/18  Yes [name], [name], MD  omeprazole (PRILOSEC) 20 MG capsule Take 20 mg by mouth daily.  07/04/18  Yes [provider]  ondansetron (ZOFRAN) 4 MG tablet Take 4 mg by mouth every 8 (eight) hours as needed for nausea or vomiting.  08/03/18  Yes [provider]  PHENobarbital (LUMINAL) 32.4 MG tablet Take 7 tablets (226.8 mg total) by mouth at bedtime. Patient taking differently: Take  259.2 mg by mouth at bedtime. 4 TABLETS 05/31/18  Yes [name], [name] K, MD  simvastatin (ZOCOR) 20 MG tablet Take 20 mg by mouth at bedtime.    Yes [provider]  tamsulosin (FLOMAX) 0.4 MG CAPS capsule Take 0.4 mg by mouth at bedtime.   Yes [provider]  TRADJENTA 5 MG TABS tablet Take 5 mg by mouth daily.  10/16/18  Yes [provider]  Darbepoetin Alfa (ARANESP) 200 MCG/0.4ML SOSY injection Inject 0.4 mLs (200 mcg total) into the vein every Thursday with hemodialysis. Patient not taking: Reported on 11/28/2018 06/28/18   Damita Lack, MD    Family History No family history on file.  Social History Social History   Tobacco Use  . Smoking status: Current Some Day Smoker    Packs/day: 0.25    Types: Cigarettes  . Smokeless tobacco: Never Used  Substance Use Topics  . Alcohol  use: Not Currently  . Drug use: Never     Allergies   Fish oil   Review of Systems Review of Systems  Constitutional: Negative for fever.  Respiratory: Negative for shortness of breath.   Cardiovascular: Negative for chest pain.  Gastrointestinal: Positive for abdominal pain, diarrhea, nausea and vomiting.  Genitourinary: Positive for hematuria. Negative for dysuria.  All other systems reviewed and are negative.    Physical Exam Updated Vital Signs BP (!) 150/75   Pulse 90   Temp 99.3 F (37.4 C) (Oral)   Resp 19   Ht 1.88 m (6' 2" )   Wt 108.9 kg   SpO2 92%   BMI 30.81 kg/m   Physical Exam Vitals signs and nursing note reviewed.  Constitutional:      Comments: Appears older than stated age  HENT:     Head: Normocephalic and atraumatic.  Eyes:     Pupils: Pupils are equal, round, and reactive to light.  Neck:     Musculoskeletal: Neck supple.  Cardiovascular:     Rate and Rhythm: Regular rhythm. Tachycardia present.     Heart sounds: Normal heart sounds. No murmur.  Pulmonary:     Effort: Pulmonary effort is normal. No respiratory distress.      Breath sounds: Normal breath sounds. No wheezing.  Abdominal:     General: Abdomen is protuberant. Bowel sounds are normal.     Palpations: Abdomen is soft.     Tenderness: There is abdominal tenderness in the left upper quadrant and left lower quadrant. There is left CVA tenderness. There is no guarding or rebound.  Musculoskeletal:     Right lower leg: No edema.     Left lower leg: No edema.  Lymphadenopathy:     Cervical: No cervical adenopathy.  Skin:    General: Skin is warm and dry.     Comments: Dry cracked skin bilateral lower extremities  Neurological:     Mental Status: He is alert and oriented to person, place, and time.  Psychiatric:        Mood and Affect: Mood normal.      ED Treatments / Results  Labs (all labs ordered are listed, but only abnormal results are displayed) Labs Reviewed  COMPREHENSIVE METABOLIC PANEL - Abnormal; Notable for the following components:      Result Value   Potassium 3.1 (*)    CO2 19 (*)    Glucose, Bld 336 (*)    BUN 66 (*)    Creatinine, Ser 7.61 (*)    Calcium 7.9 (*)    Albumin 2.7 (*)    GFR calc non Af Amer 8 (*)    GFR calc Af Amer 9 (*)    Anion gap 16 (*)    All other components within normal limits  CBC - Abnormal; Notable for the following components:   RBC 2.79 (*)    Hemoglobin 8.8 (*)    HCT 26.1 (*)    Platelets 141 (*)    All other components within normal limits  URINALYSIS, ROUTINE W REFLEX MICROSCOPIC - Abnormal; Notable for the following components:   Color, Urine RED (*)    APPearance TURBID (*)    Glucose, UA   (*)    Value: TEST NOT REPORTED DUE TO COLOR INTERFERENCE OF URINE PIGMENT   Hgb urine dipstick   (*)    Value: TEST NOT REPORTED DUE TO COLOR INTERFERENCE OF URINE PIGMENT   Bilirubin Urine   (*)  Value: TEST NOT REPORTED DUE TO COLOR INTERFERENCE OF URINE PIGMENT   Ketones, ur   (*)    Value: TEST NOT REPORTED DUE TO COLOR INTERFERENCE OF URINE PIGMENT   Protein, ur   (*)    Value: TEST  NOT REPORTED DUE TO COLOR INTERFERENCE OF URINE PIGMENT   Nitrite   (*)    Value: TEST NOT REPORTED DUE TO COLOR INTERFERENCE OF URINE PIGMENT   Leukocytes,Ua   (*)    Value: TEST NOT REPORTED DUE TO COLOR INTERFERENCE OF URINE PIGMENT   All other components within normal limits  URINALYSIS, MICROSCOPIC (REFLEX) - Abnormal; Notable for the following components:   Bacteria, UA MANY (*)    Non Squamous Epithelial PRESENT (*)    All other components within normal limits  URINE CULTURE  CULTURE, BLOOD (ROUTINE X 2)  CULTURE, BLOOD (ROUTINE X 2)  SARS CORONAVIRUS 2 (TAT 6-24 HRS)  LIPASE, BLOOD    EKG EKG Interpretation  Date/Time:  Tuesday November 27 2018 20:15:49 EST Ventricular Rate:  106 PR Interval:  176 QRS Duration: 106 QT Interval:  336 QTC Calculation: 446 R Axis:   129 Text Interpretation: Sinus tachycardia Low voltage QRS Incomplete right bundle branch block Left posterior fascicular block Abnormal ECG No significant change since last tracing Confirmed by Thayer Jew [id number]) on 11/28/2018 12:38:28 AM   Radiology Ct Abdomen Pelvis Wo Contrast  Result Date: 11/28/2018 CLINICAL DATA:  Abdominal pain, nausea, vomiting. EXAM: CT ABDOMEN AND PELVIS WITHOUT CONTRAST TECHNIQUE: Multidetector CT imaging of the abdomen and pelvis was performed following the standard protocol without IV contrast. COMPARISON:  06/18/2018 FINDINGS: Lower chest: Lung bases are clear. No effusions. Heart is normal size. Calcifications in the visualized right coronary artery. Hepatobiliary: No focal liver abnormality is seen. Status post cholecystectomy. No biliary dilatation. Pancreas: Calcifications throughout the pancreas compatible with chronic pancreatitis. Pancreatic atrophy. Findings are stable. Spleen: Splenomegaly with craniocaudal length measuring 18.8 cm, stable. Adrenals/Urinary Tract: Adrenal glands normal. Right renal pelvic stone measures 13 mm, stable. There is extensive bilateral  perinephric stranding, similar to prior study. Areas of low-density noted throughout the left kidney which were not definitively seen on prior study. No hydronephrosis. Urinary bladder grossly unremarkable. Small amount of gas in the urinary bladder, presumably from catheterization. Stomach/Bowel: Stomach, large and small bowel grossly unremarkable. Vascular/Lymphatic: No evidence of aneurysm or adenopathy. Reproductive: No visible focal abnormality. Other: No free fluid or free air. Musculoskeletal: No acute bony abnormality. IMPRESSION: Extensive perinephric stranding is similar prior study. There are areas of ill-defined low-density throughout the left kidney which were not definitively seen on prior study. Cannot exclude areas of infection/pyelonephritis. Recommend clinical correlation. 13 mm right renal pelvic stone. No hydronephrosis. Changes of chronic pancreatitis with calcifications and pancreatic atrophy. Stable splenomegaly. Electronically Signed   By: Rolm Baptise M.D.   On: 11/28/2018 01:38    Procedures Procedures (including critical care time)  Medications Ordered in ED Medications  meropenem (MERREM) 500 mg in sodium chloride 0.9 % 100 mL IVPB (0 mg Intravenous Stopped 11/28/18 0636)  sodium chloride flush (NS) 0.9 % injection 3 mL (3 mLs Intravenous Given 11/28/18 0150)  ondansetron (ZOFRAN-ODT) disintegrating tablet 4 mg (4 mg Oral Given 11/27/18 2308)  ondansetron (ZOFRAN) injection 4 mg (4 mg Intravenous Given 11/28/18 0150)  morphine 4 MG/ML injection 4 mg (4 mg Intravenous Given 11/28/18 0150)  ondansetron (ZOFRAN) injection 4 mg (4 mg Intravenous Given 11/28/18 0444)     Initial Impression / Assessment and  Plan / ED Course  I have reviewed the triage vital signs and the nursing notes.  Pertinent labs & imaging results that were available during my care of the patient were reviewed by me and considered in my medical decision making (see chart for details).        Patient  presents with nausea, vomiting, abdominal pain.  On my evaluation he is chronically ill-appearing but nontoxic.  Afebrile.  Pain is mostly in the left flank.  History of pyelonephritis also with Klebsiella bacteremia.  He is tender on exam in the left flank and CVA region.  Will repeat CT scan.  Patient given pain and nausea medication.  Clinically not septic appearing will hold off on antibiotics.  Have requested urinalysis.  CT concerning for persistent stranding of the left kidney.  Patient also with fluid collections concerning for pyelonephritis.  Urinalysis is quite turbid and bloody.  He does have greater than 50 red cells and greater than 50 white cells with many bacteria.  Urine culture sent.  Repeat blood cultures obtained as well.  I cover the patient with meropenem.  Feel he will need admission given CT findings and evidence of recurrent pyelonephritis.  Discussed with Dr. Marlowe Sax, who will admit.  Final Clinical Impressions(s) / ED Diagnoses   Final diagnoses:  Pyelonephritis    ED Discharge Orders    None       Merryl Hacker, MD 11/28/18 [phone]

## 2018-11-28 NOTE — ED Notes (Signed)
Unable to obtain 2nd set of blood cultures.

## 2018-11-28 NOTE — ED Notes (Signed)
Pt called to room x3. No response.

## 2018-11-28 NOTE — Procedures (Signed)
I was present at this dialysis session. I have reviewed the session itself and made appropriate changes.   Vital signs in last 24 hours:  Temp:  [98.6 F (37 C)-99.3 F (37.4 C)] 98.6 F (37 C) (11/04 0937) Pulse Rate:  [87-108] 88 (11/04 0937) Resp:  [12-25] 18 (11/04 0937) BP: (115-157)/(61-82) 148/78 (11/04 0937) SpO2:  [92 %-100 %] 100 % (11/04 0937) Weight:  [108.9 kg] 108.9 kg (11/04 0151) Weight change:  Filed Weights   11/28/18 0151  Weight: 108.9 kg    Recent Labs  Lab 11/28/18 0956  NA 138  K 4.2  CL 100  CO2 16*  GLUCOSE 524*  BUN 77*  CREATININE 8.56*  CALCIUM 7.7*    Recent Labs  Lab 11/27/18 2019  WBC 8.2  HGB 8.8*  HCT 26.1*  MCV 93.5  PLT 141*    Scheduled Meds: . heparin      . allopurinol  100 mg Oral Daily  . amLODipine  5 mg Oral QHS  . calcium acetate  2,001 mg Oral TID WC  . carbamazepine  800 mg Oral BID  . Chlorhexidine Gluconate Cloth  6 each Topical Q0600  . cholecalciferol  2,000 Units Oral Daily  . feeding supplement (NEPRO CARB STEADY)  237 mL Oral BID BM  . feeding supplement (PRO-STAT SUGAR FREE 64)  30 mL Oral BID  . ferrous sulfate  325 mg Oral Q breakfast  . heparin  5,000 Units Subcutaneous Q8H  . insulin aspart  0-15 Units Subcutaneous TID WC  . insulin aspart  0-5 Units Subcutaneous QHS  . insulin glargine  20 Units Subcutaneous Daily  . lactulose  30 g Oral BID  . multivitamin with minerals  1 tablet Oral QHS  . pantoprazole  20 mg Oral Daily  . PHENobarbital  259.2 mg Oral QHS  . simvastatin  20 mg Oral QHS  . tamsulosin  0.4 mg Oral QHS   Continuous Infusions: . meropenem (MERREM) IV Stopped (11/28/18 0636)   PRN Meds:.acetaminophen, cloNIDine, morphine injection, ondansetron (ZOFRAN) IV   Donetta Potts,  MD 11/28/2018, 2:15 PM

## 2018-11-29 DIAGNOSIS — E118 Type 2 diabetes mellitus with unspecified complications: Secondary | ICD-10-CM

## 2018-11-29 DIAGNOSIS — I10 Essential (primary) hypertension: Secondary | ICD-10-CM

## 2018-11-29 DIAGNOSIS — E1165 Type 2 diabetes mellitus with hyperglycemia: Secondary | ICD-10-CM

## 2018-11-29 LAB — BASIC METABOLIC PANEL
Anion gap: 16 — ABNORMAL HIGH (ref 5–15)
BUN: 29 mg/dL — ABNORMAL HIGH (ref 6–20)
CO2: 24 mmol/L (ref 22–32)
Calcium: 7.8 mg/dL — ABNORMAL LOW (ref 8.9–10.3)
Chloride: 93 mmol/L — ABNORMAL LOW (ref 98–111)
Creatinine, Ser: 4.92 mg/dL — ABNORMAL HIGH (ref 0.61–1.24)
GFR calc Af Amer: 15 mL/min — ABNORMAL LOW (ref >60)
GFR calc non Af Amer: 13 mL/min — ABNORMAL LOW (ref >60)
Glucose, Bld: 305 mg/dL — ABNORMAL HIGH (ref 70–99)
Potassium: 3 mmol/L — ABNORMAL LOW (ref 3.5–5.1)
Sodium: 133 mmol/L — ABNORMAL LOW (ref 135–145)

## 2018-11-29 LAB — GLUCOSE, CAPILLARY
Glucose-Capillary: 316 mg/dL — ABNORMAL HIGH (ref 70–99)
Glucose-Capillary: 331 mg/dL — ABNORMAL HIGH (ref 70–99)
Glucose-Capillary: 227 mg/dL — ABNORMAL HIGH (ref 70–99)
Glucose-Capillary: 271 mg/dL — ABNORMAL HIGH (ref 70–99)

## 2018-11-29 LAB — CBC
HCT: 24.3 % — ABNORMAL LOW (ref 39.0–52.0)
Hemoglobin: 8.2 g/dL — ABNORMAL LOW (ref 13.0–17.0)
MCH: 31.4 pg (ref 26.0–34.0)
MCHC: 33.7 g/dL (ref 30.0–36.0)
MCV: 93.1 fL (ref 80.0–100.0)
Platelets: 155 10*3/uL (ref 150–400)
RBC: 2.61 MIL/uL — ABNORMAL LOW (ref 4.22–5.81)
RDW: 14.6 % (ref 11.5–15.5)
WBC: 13.2 10*3/uL — ABNORMAL HIGH (ref 4.0–10.5)
nRBC: 0 % (ref 0.0–0.2)

## 2018-11-29 MED ORDER — INSULIN ASPART 100 UNIT/ML ~~LOC~~ SOLN
4.0000 [IU] | Freq: Three times a day (TID) | SUBCUTANEOUS | Status: DC
  Administered 2018-11-29 – 2018-12-05 (×15): 4 [IU] via SUBCUTANEOUS

## 2018-11-29 MED ORDER — POTASSIUM CHLORIDE CRYS ER 20 MEQ PO TBCR
40.0000 meq | EXTENDED_RELEASE_TABLET | Freq: Once | ORAL | Status: AC
  Administered 2018-11-29: 40 meq via ORAL
  Filled 2018-11-29: qty 2

## 2018-11-29 MED ORDER — CHLORPROMAZINE HCL 25 MG PO TABS
25.0000 mg | ORAL_TABLET | Freq: Three times a day (TID) | ORAL | Status: DC | PRN
  Administered 2018-11-29 – 2018-12-04 (×10): 25 mg via ORAL
  Filled 2018-11-29 (×14): qty 1

## 2018-11-29 MED ORDER — INSULIN GLARGINE 100 UNIT/ML ~~LOC~~ SOLN
24.0000 [IU] | Freq: Every day | SUBCUTANEOUS | Status: DC
  Administered 2018-11-30 – 2018-12-01 (×2): 24 [IU] via SUBCUTANEOUS
  Filled 2018-11-29 (×2): qty 0.24

## 2018-11-29 NOTE — Progress Notes (Addendum)
[facility] Progress Note   Subjective:   Patient seen and examined at bedside. Tired this AM. Reports ongoing nausea and hiccups. RN reports multiple BMs overnight. Pt denies SOB, orthopnea, CP, dizziness, palpitations, and edema. BP soft overnight after HD.   Objective Vitals:   11/28/18 1822 11/28/18 1927 11/28/18 2058 11/29/18 0529  BP: (!) 97/53 98/69 (!) 82/49 (!) 103/58  Pulse: 98 89 98 90  Resp: 20 (!) 22 19 19   Temp: 99.8 F (37.7 C) 99.7 F (37.6 C) 99.6 F (37.6 C) 99 F (37.2 C)  TempSrc: Oral Oral Oral Oral  SpO2: 100% 97% 97% 98%  Weight: 104 kg     Height:       Physical Exam General: Well developed, well nourished male. NAD Heart: RRR, no murmurs, rubs or gallops Lungs: CTA bilaterally without wheezing, rhonchi or rales Abdomen: Soft, non-tender, non-distended. + BS. + Hiccups Extremities: No peripheral edema Dialysis Access: RIJ TDC, RUE AVF + bruit  Additional Objective Labs: Basic Metabolic Panel: Recent Labs  Lab 11/27/18 2019 11/28/18 0956 11/29/18 0120  NA 135 138 133*  K 3.1* 4.2 3.0*  CL 100 100 93*  CO2 19* 16* 24  GLUCOSE 336* 524* 305*  BUN 66* 77* 29*  CREATININE 7.61* 8.56* 4.92*  CALCIUM 7.9* 7.7* 7.8*   Liver Function Tests: Recent Labs  Lab 11/27/18 2019  AST 17  ALT 14  ALKPHOS 125  BILITOT 0.7  PROT 6.5  ALBUMIN 2.7*   Recent Labs  Lab 11/27/18 2019  LIPASE 11   CBC: Recent Labs  Lab 11/27/18 2019 11/29/18 0120  WBC 8.2 13.2*  HGB 8.8* 8.2*  HCT 26.1* 24.3*  MCV 93.5 93.1  PLT 141* 155   Blood Culture    Component Value Date/Time   SDES BLOOD RIGHT HAND 11/28/2018 0526   SPECREQUEST  11/28/2018 0526    BOTTLES DRAWN AEROBIC AND ANAEROBIC Blood Culture results may not be optimal due to an inadequate volume of blood received in culture bottles   CULT  11/28/2018 0526    NO GROWTH < 12 HOURS Performed at [facility], [address] [place],  [address]    REPTSTATUS PENDING  11/28/2018 0526    CBG: Recent Labs  Lab 11/28/18 0824 11/28/18 1224 11/28/18 1324 11/28/18 2143 11/29/18 0757  GLUCAP 497* 356* 289* 304* 316*    Studies/Results: Ct Abdomen Pelvis Wo Contrast  Result Date: 11/28/2018 CLINICAL DATA:  Abdominal pain, nausea, vomiting. EXAM: CT ABDOMEN AND PELVIS WITHOUT CONTRAST TECHNIQUE: Multidetector CT imaging of the abdomen and pelvis was performed following the standard protocol without IV contrast. COMPARISON:  06/18/2018 FINDINGS: Lower chest: Lung bases are clear. No effusions. Heart is normal size. Calcifications in the visualized right coronary artery. Hepatobiliary: No focal liver abnormality is seen. Status post cholecystectomy. No biliary dilatation. Pancreas: Calcifications throughout the pancreas compatible with chronic pancreatitis. Pancreatic atrophy. Findings are stable. Spleen: Splenomegaly with craniocaudal length measuring 18.8 cm, stable. Adrenals/Urinary Tract: Adrenal glands normal. Right renal pelvic stone measures 13 mm, stable. There is extensive bilateral perinephric stranding, similar to prior study. Areas of low-density noted throughout the left kidney which were not definitively seen on prior study. No hydronephrosis. Urinary bladder grossly unremarkable. Small amount of gas in the urinary bladder, presumably from catheterization. Stomach/Bowel: Stomach, large and small bowel grossly unremarkable. Vascular/Lymphatic: No evidence of aneurysm or adenopathy. Reproductive: No visible focal abnormality. Other: No free fluid or free air. Musculoskeletal: No acute bony abnormality. IMPRESSION: Extensive  perinephric stranding is similar prior study. There are areas of ill-defined low-density throughout the left kidney which were not definitively seen on prior study. Cannot exclude areas of infection/pyelonephritis. Recommend clinical correlation. 13 mm right renal pelvic stone. No hydronephrosis. Changes of chronic pancreatitis with  calcifications and pancreatic atrophy. Stable splenomegaly. Electronically Signed   By: Rolm Baptise M.D.   On: 11/28/2018 01:38   Dg Chest Port 1 View  Result Date: 11/28/2018 CLINICAL DATA:  Chest pain and abnormal breath sounds. EXAM: PORTABLE CHEST 1 VIEW COMPARISON:  08/03/2018 FINDINGS: Right IJ dialysis catheter unchanged. Lungs are adequately inflated without lobar consolidation or effusion. Cardiomediastinal silhouette and remainder of the exam is unchanged. IMPRESSION: No active disease. Electronically Signed   By: Marin Olp M.D.   On: 11/28/2018 07:54   Medications: . meropenem (MERREM) IV 500 mg (11/29/18 0434)   . allopurinol  100 mg Oral Daily  . amLODipine  5 mg Oral QHS  . calcium acetate  2,001 mg Oral TID WC  . carbamazepine  800 mg Oral BID  . Chlorhexidine Gluconate Cloth  6 each Topical Q0600  . cholecalciferol  2,000 Units Oral Daily  . feeding supplement (NEPRO CARB STEADY)  237 mL Oral BID BM  . feeding supplement (PRO-STAT SUGAR FREE 64)  30 mL Oral BID  . ferrous sulfate  325 mg Oral Q breakfast  . heparin  5,000 Units Subcutaneous Q8H  . insulin aspart  0-15 Units Subcutaneous TID WC  . insulin aspart  0-5 Units Subcutaneous QHS  . insulin glargine  20 Units Subcutaneous Daily  . lactulose  30 g Oral BID  . multivitamin with minerals  1 tablet Oral QHS  . pantoprazole  20 mg Oral Daily  . PHENobarbital  259.2 mg Oral QHS  . simvastatin  20 mg Oral QHS  . tamsulosin  0.4 mg Oral QHS    Dialysis Orders: Center: [facility]  on MWF. Time: 4h 59mn, 180NRe, BFR 400, DFR 800, EDW 105.5kg, 3K, 2.25Ca, TDC Heparin 3000 units Mircera 1544m IV q 2 weeks- last dose 11/21/2018 Binder: calcium acetate 4 caps TID AC and 2 with snacks  Assessment/Plan: 1. Acute pyelonephritis: CT concerning for left pyelonephritis and with nonobstructive right renal pelvic stone. Afebrile and without leukocytosis. History of klebsiella UTI/bacteremia in 05/2018. On  Meropenem, urine and blood cultures 1/2 positive for likely contaminant. On zofran and morning prn nausea/pain. Per primary.  2.  ESRD:  MWF, missed treatment 11/2 due to pain/nausea. Used 4K bath yesterday, K+ 3.0 this AM. Will give potassium 40 mEq today, follow potassium.  3.  Hypertension/volume: BP soft after HD, now below EDW by weights here. Minimal PO intake, will plan minimal UF with HD tomorrow. CXR clear on admission, no peripheral edema on exam.  4.  Anemia: Hemoglobin 8.2, ESA recently dosed. Hold IV Fe in the setting of sepsis. Follow hemoglobin.  5.  Metabolic bone disease: Calcium 8.8 corrected. Last outpatient phos 8.4. Continue calcium acetate binder.  6.  Nutrition:  Renal/carb modified diet as tolerated. Continue protein supplement.  7. T2DM: On insulin, per primary.    SaAnice PaganiniPA-C 11/29/2018, 8:55 AM  [facility] Pager: [phone] have seen and examined this patient and agree with plan and assessment in the above note with renal recommendations/intervention highlighted.  Continue with iv antibiotics and HD on his regular schedule of MWF. JoBroadus John Aking Klabunde,MD 11/29/2018 12:06 PM

## 2018-11-29 NOTE — Progress Notes (Signed)
PHARMACY - PHYSICIAN COMMUNICATION CRITICAL VALUE ALERT - BLOOD CULTURE IDENTIFICATION (BCID)  Eddie Davies is an 48 y.o. male who presented to [facility] on 11/27/2018 with a chief complaint of abdominal pain/pyelonephritis   Assessment:  1/2 blood cultures growing Gram positive cocci in clusters, likely contaminant.  Name of physician (or Provider) Contacted: B Kyere  Current antibiotics: Meropenem   Changes to prescribed antibiotics recommended:   None at this time. F/U blood cultures   Caryl Pina 11/29/2018  6:21 AM

## 2018-11-29 NOTE — Progress Notes (Signed)
[facility]  PROGRESS NOTE  Eddie Davies ZHY:[id number] DOB: [birth year] DOA: 11/27/2018 PCP: Practice, [facility]   Brief HPI:   48 year old male with a history of diabetes mellitus type 2, liver cirrhosis, ESRD on hemodialysis Monday Wednesday Friday, hypertension, history of ESBL/Klebsiella UTI presents with chief complaint of abdominal/flank pain.  He reported 4-day history of left abdominal pain and flank pain.  In the ED urine microscopy showed WBCs 50 with many bacteria.  CT abdomen pelvis findings were concerning for left renal pyelonephritis with 13 mm right renal pelvic stone.  Patient started on meropenem as he had history of Klebsiella UTI bacteremia, ESBL in May 2020.    Subjective   Patient seen and examined, denies any abdominal pain no nausea vomiting.   Assessment/Plan:     1. Acute pyelonephritis-seen on CT abdomen pelvis which was concerning for left renal pyelonephritis.  Patient has history of Klebsiella UTI, ESBL.  Started on meropenem.  Follow urine culture results.  Urine culture is growing greater than 100,000 colonies per mL of gram-negative rods.  2. Nonobstructing right renal pelvic stone-CT showed 13 mm right renal pelvic stone with slight increase in size compared to prior CT from May 2020.  There is no hydronephrosis.  He will need a urology follow-up as discharge.  3. Type II diabetes mellitus-glucose is elevated, increase Lantus to 24 units subcu daily, will add 4 units NovoLog meal coverage.    4. ESRD-patient on hemodialysis Monday Wednesday Friday, missed treatment on 11/26/2018 due to pain and nausea.  Hemodialyzed yesterday.  Nephrology following.  Potassium is 3.  0, potassium replacement per nephrology.  5. History of seizures-continue phenobarb, Tylenol.  6. Hypertension-continue amlodipine, clonidine.  7. ESRD-continue lactulose  8. GERD-continue PPI  9. Hyperlipidemia-continue Zocor  10. BPH-continue  Flomax     CBG: Recent Labs  Lab 11/28/18 1224 11/28/18 1324 11/28/18 2143 11/29/18 0757 11/29/18 1232  GLUCAP 356* 289* 304* 316* 331*    CBC: Recent Labs  Lab 11/27/18 2019 11/29/18 0120  WBC 8.2 13.2*  HGB 8.8* 8.2*  HCT 26.1* 24.3*  MCV 93.5 93.1  PLT 141* 295    Basic Metabolic Panel: Recent Labs  Lab 11/27/18 2019 11/28/18 0956 11/29/18 0120  NA 135 138 133*  K 3.1* 4.2 3.0*  CL 100 100 93*  CO2 19* 16* 24  GLUCOSE 336* 524* 305*  BUN 66* 77* 29*  CREATININE 7.61* 8.56* 4.92*  CALCIUM 7.9* 7.7* 7.8*  MG  --  2.2  --      DVT prophylaxis: Heparin  Code Status: Full code  Family Communication: No family at bedside  Disposition Plan: likely home when medically ready for discharge Pressure Injury 04/21/18 Stage II -  Partial thickness loss of dermis presenting as a shallow open ulcer with a red, pink wound bed without slough. redness, possible previous injury (Active)  04/21/18 2200  Location: Coccyx  Location Orientation: Medial  Staging: Stage II -  Partial thickness loss of dermis presenting as a shallow open ulcer with a red, pink wound bed without slough.  Wound Description (Comments): redness, possible previous injury  Present on Admission: Yes     BMI  Estimated body mass index is 29.44 kg/m as calculated from the following:   Height as of this encounter: 6' 2"  (1.88 m).   Weight as of this encounter: 104 kg.  Scheduled medications:  . allopurinol  100 mg Oral Daily  . amLODipine  5 mg Oral QHS  . calcium  acetate  2,001 mg Oral TID WC  . carbamazepine  800 mg Oral BID  . Chlorhexidine Gluconate Cloth  6 each Topical Q0600  . cholecalciferol  2,000 Units Oral Daily  . feeding supplement (NEPRO CARB STEADY)  237 mL Oral BID BM  . feeding supplement (PRO-STAT SUGAR FREE 64)  30 mL Oral BID  . ferrous sulfate  325 mg Oral Q breakfast  . heparin  5,000 Units Subcutaneous Q8H  . insulin aspart  0-15 Units Subcutaneous TID WC  .  insulin aspart  0-5 Units Subcutaneous QHS  . insulin glargine  20 Units Subcutaneous Daily  . lactulose  30 g Oral BID  . multivitamin with minerals  1 tablet Oral QHS  . pantoprazole  20 mg Oral Daily  . PHENobarbital  259.2 mg Oral QHS  . simvastatin  20 mg Oral QHS  . tamsulosin  0.4 mg Oral QHS    Consultants:  Nephrology  Procedures:     Antibiotics:   Anti-infectives (From admission, onward)   Start     Dose/Rate Route Frequency Ordered Stop   11/28/18 0600  meropenem (MERREM) 1 g in sodium chloride 0.9 % 100 mL IVPB  Status:  Discontinued     1 g 200 mL/hr over 30 Minutes Intravenous Every 8 hours 11/28/18 0419 11/28/18 0420   11/28/18 0500  meropenem (MERREM) 500 mg in sodium chloride 0.9 % 100 mL IVPB     500 mg 200 mL/hr over 30 Minutes Intravenous Every 24 hours 11/28/18 0420         Objective   Vitals:   11/28/18 1927 11/28/18 2058 11/29/18 0529 11/29/18 1312  BP: 98/69 (!) 82/49 (!) 103/58 115/66  Pulse: 89 98 90 85  Resp: (!) 22 19 19 20   Temp: 99.7 F (37.6 C) 99.6 F (37.6 C) 99 F (37.2 C) 98.6 F (37 C)  TempSrc: Oral Oral Oral Oral  SpO2: 97% 97% 98% 97%  Weight:      Height:        Intake/Output Summary (Last 24 hours) at 11/29/2018 1345 Last data filed at 11/29/2018 0600 Gross per 24 hour  Intake 506.72 ml  Output 2143 ml  Net -1636.28 ml   Filed Weights   11/28/18 0151 11/28/18 1401 11/28/18 1822  Weight: 108.9 kg 107 kg 104 kg     Physical Examination:    General: Appears in no acute distress  Cardiovascular: S1-S2, regular, no murmur auscultated  Respiratory: Clear to auscultation bilaterally, no wheezing or crackles  Abdomen: Abdomen is soft, nontender, no organomegaly  Extremities: No edema in the lower extremities  Neurologic: No focal deficit noted     Data Reviewed: I have personally reviewed following labs and imaging studies   Recent Results (from the past 240 hour(s))  Urine culture     Status:  Abnormal (Preliminary result)   Collection Time: 11/28/18  4:11 AM   Specimen: Urine, Random  Result Value Ref Range Status   Specimen Description URINE, RANDOM  Final   Special Requests NONE  Final   Culture (A)  Final    >=100,000 COLONIES/mL GRAM NEGATIVE RODS IDENTIFICATION AND SUSCEPTIBILITIES TO FOLLOW Performed at [facility], [address], [place], [place] [address]    Report Status PENDING  Incomplete  Blood culture (routine x 2)     Status: None (Preliminary result)   Collection Time: 11/28/18  4:15 AM   Specimen: BLOOD LEFT HAND  Result Value Ref Range Status   Specimen  Description BLOOD LEFT HAND  Final   Special Requests   Final    BOTTLES DRAWN AEROBIC AND ANAEROBIC Blood Culture results may not be optimal due to an inadequate volume of blood received in culture bottles   Culture  Setup Time   Final    GRAM POSITIVE COCCI IN CLUSTERS IN BOTH AEROBIC AND ANAEROBIC BOTTLES CRITICAL RESULT CALLED TO, READ BACK BY AND VERIFIED WITH: G. ABBOTT,PHARMD 0425 11/29/2018 Mena Goes Performed at [facility], [address] [place], [place] [address]    Culture GRAM POSITIVE COCCI  Final   Report Status PENDING  Incomplete  Blood culture (routine x 2)     Status: None (Preliminary result)   Collection Time: 11/28/18  5:26 AM   Specimen: BLOOD RIGHT HAND  Result Value Ref Range Status   Specimen Description BLOOD RIGHT HAND  Final   Special Requests   Final    BOTTLES DRAWN AEROBIC AND ANAEROBIC Blood Culture results may not be optimal due to an inadequate volume of blood received in culture bottles   Culture   Final    NO GROWTH 1 DAY Performed at [facility], [place] [address], [place], [place] [address]    Report Status PENDING  Incomplete  SARS CORONAVIRUS 2 (TAT 6-24 HRS) Nasopharyngeal Nasopharyngeal Swab     Status: None   Collection Time: 11/28/18  6:48 AM   Specimen: Nasopharyngeal Swab  Result Value Ref Range Status   SARS Coronavirus 2  NEGATIVE NEGATIVE Final    Comment: (NOTE) SARS-CoV-2 target nucleic acids are NOT DETECTED. The SARS-CoV-2 RNA is generally detectable in upper and lower respiratory specimens during the acute phase of infection. Negative results do not preclude SARS-CoV-2 infection, do not rule out co-infections with other pathogens, and should not be used as the sole basis for treatment or other patient management decisions. Negative results must be combined with clinical observations, patient history, and epidemiological information. The expected result is Negative. Fact Sheet for Patients: SugarRoll.be Fact Sheet for Healthcare Providers: https://www.woods-mathews.com/ This test is not yet approved or cleared by the [place] FDA and  has been authorized for detection and/or diagnosis of SARS-CoV-2 by FDA under an Emergency Use Authorization (EUA). This EUA will remain  in effect (meaning this test can be used) for the duration of the COVID-19 declaration under Section 56 4(b)(1) of the Act, 21 U.S.C. section 360bbb-3(b)(1), unless the authorization is terminated or revoked sooner. Performed at [facility], [place] [address], [place], [place] [address]      Liver Function Tests: Recent Labs  Lab 11/27/18 2019  AST 17  ALT 14  ALKPHOS 125  BILITOT 0.7  PROT 6.5  ALBUMIN 2.7*   Recent Labs  Lab 11/27/18 2019  LIPASE 11   No results for input(s): AMMONIA in the last 168 hours.  Cardiac Enzymes: No results for input(s): CKTOTAL, CKMB, CKMBINDEX, TROPONINI in the last 168 hours. BNP (last 3 results) Recent Labs    04/21/18 2209  BNP 254.9*    ProBNP (last 3 results) No results for input(s): PROBNP in the last 8760 hours.    Studies: Ct Abdomen Pelvis Wo Contrast  Result Date: 11/28/2018 CLINICAL DATA:  Abdominal pain, nausea, vomiting. EXAM: CT ABDOMEN AND PELVIS WITHOUT CONTRAST TECHNIQUE: Multidetector CT imaging of the  abdomen and pelvis was performed following the standard protocol without IV contrast. COMPARISON:  06/18/2018 FINDINGS: Lower chest: Lung bases are clear. No effusions. Heart is normal size. Calcifications  in the visualized right coronary artery. Hepatobiliary: No focal liver abnormality is seen. Status post cholecystectomy. No biliary dilatation. Pancreas: Calcifications throughout the pancreas compatible with chronic pancreatitis. Pancreatic atrophy. Findings are stable. Spleen: Splenomegaly with craniocaudal length measuring 18.8 cm, stable. Adrenals/Urinary Tract: Adrenal glands normal. Right renal pelvic stone measures 13 mm, stable. There is extensive bilateral perinephric stranding, similar to prior study. Areas of low-density noted throughout the left kidney which were not definitively seen on prior study. No hydronephrosis. Urinary bladder grossly unremarkable. Small amount of gas in the urinary bladder, presumably from catheterization. Stomach/Bowel: Stomach, large and small bowel grossly unremarkable. Vascular/Lymphatic: No evidence of aneurysm or adenopathy. Reproductive: No visible focal abnormality. Other: No free fluid or free air. Musculoskeletal: No acute bony abnormality. IMPRESSION: Extensive perinephric stranding is similar prior study. There are areas of ill-defined low-density throughout the left kidney which were not definitively seen on prior study. Cannot exclude areas of infection/pyelonephritis. Recommend clinical correlation. 13 mm right renal pelvic stone. No hydronephrosis. Changes of chronic pancreatitis with calcifications and pancreatic atrophy. Stable splenomegaly. Electronically Signed   By: Rolm Baptise M.D.   On: 11/28/2018 01:38   Dg Chest Port 1 View  Result Date: 11/28/2018 CLINICAL DATA:  Chest pain and abnormal breath sounds. EXAM: PORTABLE CHEST 1 VIEW COMPARISON:  08/03/2018 FINDINGS: Right IJ dialysis catheter unchanged. Lungs are adequately inflated without lobar  consolidation or effusion. Cardiomediastinal silhouette and remainder of the exam is unchanged. IMPRESSION: No active disease. Electronically Signed   By: Marin Olp M.D.   On: 11/28/2018 07:54     Admission status: Inpatient: Based on patients clinical presentation and evaluation of above clinical data, I have made determination that patient meets Inpatient criteria at this time.   [facility] Pager [phone]. If 7PM-7AM, please contact night-coverage at [email or web address], Office  [phone]  password [email or web address]  11/29/2018, 1:45 PM  LOS: 1 day

## 2018-11-29 NOTE — NC FL2 (Signed)
[place] LEVEL OF CARE SCREENING TOOL     IDENTIFICATION  Patient Name: Eddie Davies Birthdate: [birth year] Sex: male Admission Date (Current Location): 11/27/2018  [place] and [place] Number:  Oval Linsey [id number] [place] and Address:  [facility], [place] [address] [place], [place] [address]      Provider Number: [id number]  Attending Physician Name and Address:  Oswald Hillock, MD  Relative Name and Phone Number:       Current Level of Care: Hospital Recommended Level of Care: [place] Prior Approval Number:    Date Approved/Denied:   PASRR Number: [id number]  Discharge Plan: SNF    Current Diagnoses: Patient Active Problem List   Diagnosis Date Noted  . Pyelonephritis 11/28/2018  . Bacteremia due to Klebsiella pneumoniae 06/19/2018  . Fever 06/18/2018  . ESRD (end stage renal disease) on dialysis ([place]) 06/18/2018  . Anemia due to chronic kidney disease 06/18/2018  . Leukocytosis 06/18/2018  . Acute metabolic encephalopathy 11/09/5100  . Seizures ([place]) 05/24/2018  . BPH (benign prostatic hyperplasia) 05/24/2018  . Sepsis ([place]) 05/24/2018  . Benign essential HTN 05/24/2018  . HLD (hyperlipidemia) 05/24/2018  . Diabetes mellitus type 2, uncontrolled, with complications ([place]) 58/52/7782  . Thrombocytopenia ([place]) 05/24/2018  . Confusion   . Metabolic acidosis 42/35/3614  . Respiratory failure ([place])   . Acute renal failure superimposed on stage 4 chronic kidney disease ([place])   . End stage liver disease ([place])   . Pressure injury of skin 04/21/2018  . Altered mental status, unspecified 04/21/2018  . HCAP (healthcare-associated pneumonia) 04/21/2018  . Acute renal failure (ARF) ([place]) 04/07/2018    Orientation RESPIRATION BLADDER Height & Weight     Self, Time, Situation, Place  Normal Continent Weight: 229 lb 4.5 oz (104 kg) Height:  6' 2"  (188 cm)  BEHAVIORAL SYMPTOMS/MOOD NEUROLOGICAL BOWEL  NUTRITION STATUS      Continent Diet(see discharge summary)  AMBULATORY STATUS COMMUNICATION OF NEEDS Skin   Extensive Assist Verbally Other (Comment)(cracking on bilateral heels and legs)                       Personal Care Assistance Level of Assistance  Bathing, Dressing, Feeding Bathing Assistance: Maximum assistance Feeding assistance: Independent Dressing Assistance: Maximum assistance     Functional Limitations Info  Sight, Hearing, Speech Sight Info: Adequate Hearing Info: Adequate Speech Info: Adequate    SPECIAL CARE FACTORS FREQUENCY  PT (By licensed PT), OT (By licensed OT)     PT Frequency: 5x week OT Frequency: 5x week            Contractures Contractures Info: Not present    Additional Factors Info  Code Status, Allergies, Isolation Precautions, Insulin Sliding Scale Code Status Info: Full Code Allergies Info: fish oil   Insulin Sliding Scale Info: insulin aspart (novoLOG) injection 0-15 Units 3x daily with meals; insulin aspart (novoLOG) injection 0-5 Units daily at bedtime; insulin glargine (LANTUS) injection 20 Units daily Isolation Precautions Info: ESBL     Current Medications (11/29/2018):  This is the current hospital active medication list Current Facility-Administered Medications  Medication Dose Route Frequency Provider Last Rate Last Dose  . acetaminophen (TYLENOL) tablet 650 mg  650 mg Oral Q8H PRN Shela Leff, MD      . allopurinol (ZYLOPRIM) tablet 100 mg  100 mg Oral Daily Shela Leff, MD   100 mg at 11/29/18 1020  . amLODipine (NORVASC) tablet 5 mg  5 mg  Oral Edrick Oh, MD   5 mg at 11/28/18 2135  . calcium acetate (PHOSLO) capsule 2,001 mg  2,001 mg Oral TID WC Shela Leff, MD      . carbamazepine (TEGRETOL) tablet 800 mg  800 mg Oral BID Shela Leff, MD   800 mg at 11/29/18 1021  . Chlorhexidine Gluconate Cloth 2 % PADS 6 each  6 each Topical Q0600 Janalee Dane, [name]   6 each at  11/29/18 [phone]  . cholecalciferol (VITAMIN D3) tablet 2,000 Units  2,000 Units Oral Daily Shela Leff, MD   2,000 Units at 11/29/18 1020  . cloNIDine (CATAPRES) tablet 0.1 mg  0.1 mg Oral BID PRN Shela Leff, MD      . feeding supplement (NEPRO CARB STEADY) liquid 237 mL  237 mL Oral BID BM Shela Leff, MD   237 mL at 11/29/18 1021  . feeding supplement (PRO-STAT SUGAR FREE 64) liquid 30 mL  30 mL Oral BID Shela Leff, MD   30 mL at 11/29/18 1021  . ferrous sulfate tablet 325 mg  325 mg Oral Q breakfast Shela Leff, MD      . heparin injection 5,000 Units  5,000 Units Subcutaneous Q8H Shela Leff, MD   5,000 Units at 11/29/18 0503  . insulin aspart (novoLOG) injection 0-15 Units  0-15 Units Subcutaneous TID WC [name], [name] K, MD   11 Units at 11/29/18 0844  . insulin aspart (novoLOG) injection 0-5 Units  0-5 Units Subcutaneous QHS [name], [name] K, MD   4 Units at 11/28/18 2144  . insulin glargine (LANTUS) injection 20 Units  20 Units Subcutaneous Daily [name], [name] K, MD   20 Units at 11/29/18 1020  . lactulose (CHRONULAC) 10 GM/15ML solution 30 g  30 g Oral BID Shela Leff, MD   Stopped at 11/28/18 2134  . meropenem (MERREM) 500 mg in sodium chloride 0.9 % 100 mL IVPB  500 mg Intravenous Q24H Shela Leff, MD 200 mL/hr at 11/29/18 0434 500 mg at 11/29/18 0434  . morphine 2 MG/ML injection 1 mg  1 mg Intravenous Q4H PRN Shela Leff, MD   1 mg at 11/28/18 1007  . multivitamin with minerals tablet 1 tablet  1 tablet Oral QHS Shela Leff, MD   1 tablet at 11/28/18 2134  . ondansetron (ZOFRAN) injection 4 mg  4 mg Intravenous Q6H PRN Shela Leff, MD   4 mg at 11/29/18 1025  . pantoprazole (PROTONIX) EC tablet 20 mg  20 mg Oral Daily Shela Leff, MD   20 mg at 11/29/18 1020  . PHENobarbital (LUMINAL) tablet 259.2 mg  259.2 mg Oral QHS Shela Leff, MD   259.2 mg at 11/28/18 2313  . simvastatin (ZOCOR) tablet 20 mg   20 mg Oral QHS Shela Leff, MD   20 mg at 11/28/18 2135  . tamsulosin (FLOMAX) capsule 0.4 mg  0.4 mg Oral QHS Shela Leff, MD   0.4 mg at 11/28/18 2135     Discharge Medications: Please see discharge summary for a list of discharge medications.  Relevant Imaging Results:  Relevant Lab Results:   Additional Information SSN: [id number]  Alexander [place], [place]

## 2018-11-29 NOTE — Progress Notes (Signed)
Inpatient Diabetes Program Recommendations  AACE/ADA: New Consensus Statement on Inpatient Glycemic Control (2015)  Target Ranges:  Prepandial:   less than 140 mg/dL      Peak postprandial:   less than 180 mg/dL (1-2 hours)      Critically ill patients:  140 - 180 mg/dL   Lab Results  Component Value Date   GLUCAP 316 (H) 11/29/2018   HGBA1C 7.2 (H) 06/19/2018    Review of Glycemic Control Results for MENA, LIENAU (MRN [id number]) as of 11/29/2018 09:39  Ref. Range 11/28/2018 13:24 11/28/2018 21:43 11/29/2018 07:57  Glucose-Capillary Latest Ref Range: 70 - 99 mg/dL 289 (H) 304 (H) 316 (H)   Diabetes history: Type 2 DM Outpatient Diabetes medications: Novolog 0-15 units QID, Basaglar 24 units QHS, Tradjenta 5 mg QD Current orders for Inpatient glycemic control: Lantus 20 units QD, Novolog 0-15 units TID, Novolog 0-5 units QHS  Inpatient Diabetes Program Recommendations:    Noted hyperglycemia yesterday and subsequent order changes.   Consider:  -Increasing basal insulin to Lantus 24 units QD -Adding Novolog 4 units TID (Assuming that patient is consuming 50% of meal).   Thanks, Bronson Curb, MSN, RNC-OB Diabetes Coordinator [phone] (8a-5p)

## 2018-11-29 NOTE — TOC Initial Note (Signed)
Transition of Care [facility]) - Initial/Assessment Note    Patient Details  Name: Eddie Davies MRN: [id number] Date of Birth: [birth year]  [facility] CM/SW Contact:    Alexander Mt, [place] Phone Number: 11/29/2018, 10:41 AM  Clinical Narrative:                 CSW met with pt at bedside. Introduced self, role, reason for visit. Pt confirms he is from [facility] in [place] where he has lived for over a year. Readmission risk assessment complete. Plan for return to [facility] at discharge. Pt declines for CSW to call family but is okay with CSW updating [facility].   CSW will continue to follow.   Expected Discharge Plan: Skilled Nursing Facility Barriers to Discharge: Continued Medical Work up   Patient Goals and CMS Choice   CMS Medicare.gov Compare Post Acute Care list provided to:: Patient Choice offered to / list presented to : Patient  Expected Discharge Plan and Services Expected Discharge Plan: [place] In-house Referral: Clinical Social Work Discharge Planning Services: CM Consult Post Acute Care Choice: Resumption of Svcs/PTA Provider, [place] Living arrangements for the past 2 months: [place]  Prior Living Arrangements/Services Living arrangements for the past 2 months: [place] Lives with:: Facility Resident Patient language and need for interpreter reviewed:: Yes(no needs) Do you feel safe going back to the place where you live?: Yes      Need for Family Participation in Patient Care: Yes (Comment)(support/supervision) Care giver support system in place?: Yes (comment)(family/facility staff)   Criminal Activity/Legal Involvement Pertinent to Current Situation/Hospitalization: No - Comment as needed  Activities of Daily Living      Permission Sought/Granted Permission sought to share information with : Facility Sport and exercise psychologist, Family Supports Permission  granted to share information with : Yes, Verbal Permission Granted     Permission granted to share info w AGENCY: [facility]        Emotional Assessment Appearance:: Appears stated age Attitude/Demeanor/Rapport: Guarded Affect (typically observed): Flat, Accepting, Guarded Orientation: : Oriented to Self, Oriented to  Time, Oriented to Place, Oriented to Situation Alcohol / Substance Use: Not Applicable Psych Involvement: No (comment)  Admission diagnosis:  [place] [R09.89] Pyelonephritis [N12] Patient Active Problem List   Diagnosis Date Noted  . Pyelonephritis 11/28/2018  . Bacteremia due to Klebsiella pneumoniae 06/19/2018  . Fever 06/18/2018  . ESRD (end stage renal disease) on dialysis ([place]) 06/18/2018  . Anemia due to chronic kidney disease 06/18/2018  . Leukocytosis 06/18/2018  . Acute metabolic encephalopathy 38/10/1749  . Seizures ([place]) 05/24/2018  . BPH (benign prostatic hyperplasia) 05/24/2018  . Sepsis ([place]) 05/24/2018  . Benign essential HTN 05/24/2018  . HLD (hyperlipidemia) 05/24/2018  . Diabetes mellitus type 2, uncontrolled, with complications ([place]) 02/58/5277  . Thrombocytopenia ([place]) 05/24/2018  . Confusion   . Metabolic acidosis 82/42/3536  . Respiratory failure ([place])   . Acute renal failure superimposed on stage 4 chronic kidney disease ([place])   . End stage liver disease ([place])   . Pressure injury of skin 04/21/2018  . Altered mental status, unspecified 04/21/2018  . HCAP (healthcare-associated pneumonia) 04/21/2018  . Acute renal failure (ARF) ([place]) 04/07/2018   PCP:  Practice, [place]:  No Pharmacies Listed    Social Determinants of Health (SDOH) Interventions    Readmission Risk Interventions Readmission Risk Prevention Plan 11/29/2018 05/28/2018 05/23/2018  Transportation Screening Complete - Complete  PCP or Specialist Appt  within 5-7 Days - - -  Home Care Screening - - -  Medication Review (RN CM) - - -  Medication  Review (RN Care Manager) Complete - Complete  PCP or Specialist appointment within 3-5 days of discharge Not Complete - -  PCP/Specialist Appt Not Complete comments pt is SNF resident - -  [place] or [place] Not Complete Complete (No Data)  [place] or Home Care Consult Pt Refusal Comments pt is SNF resident - -  SW Recovery Care/Counseling Consult Complete Complete -  Palliative Care Screening Not Applicable - Not Applicable  Skilled Nursing Facility Complete - Complete  Some recent data might be hidden

## 2018-11-30 LAB — HEPATITIS B E ANTIGEN: Hep B E Ag: NEGATIVE

## 2018-11-30 LAB — CBC
HCT: 21.7 % — ABNORMAL LOW (ref 39.0–52.0)
Hemoglobin: 7.3 g/dL — ABNORMAL LOW (ref 13.0–17.0)
MCH: 31.5 pg (ref 26.0–34.0)
MCHC: 33.6 g/dL (ref 30.0–36.0)
MCV: 93.5 fL (ref 80.0–100.0)
Platelets: 126 10*3/uL — ABNORMAL LOW (ref 150–400)
RBC: 2.32 MIL/uL — ABNORMAL LOW (ref 4.22–5.81)
RDW: 14.6 % (ref 11.5–15.5)
WBC: 10.9 10*3/uL — ABNORMAL HIGH (ref 4.0–10.5)
nRBC: 0.2 % (ref 0.0–0.2)

## 2018-11-30 LAB — CULTURE, BLOOD (ROUTINE X 2)

## 2018-11-30 LAB — BASIC METABOLIC PANEL
Anion gap: 16 — ABNORMAL HIGH (ref 5–15)
BUN: 51 mg/dL — ABNORMAL HIGH (ref 6–20)
CO2: 22 mmol/L (ref 22–32)
Calcium: 7.6 mg/dL — ABNORMAL LOW (ref 8.9–10.3)
Chloride: 94 mmol/L — ABNORMAL LOW (ref 98–111)
Creatinine, Ser: 7.61 mg/dL — ABNORMAL HIGH (ref 0.61–1.24)
GFR calc Af Amer: 9 mL/min — ABNORMAL LOW (ref >60)
GFR calc non Af Amer: 8 mL/min — ABNORMAL LOW (ref >60)
Glucose, Bld: 385 mg/dL — ABNORMAL HIGH (ref 70–99)
Potassium: 3.2 mmol/L — ABNORMAL LOW (ref 3.5–5.1)
Sodium: 132 mmol/L — ABNORMAL LOW (ref 135–145)

## 2018-11-30 LAB — GLUCOSE, CAPILLARY
Glucose-Capillary: 206 mg/dL — ABNORMAL HIGH (ref 70–99)
Glucose-Capillary: 320 mg/dL — ABNORMAL HIGH (ref 70–99)
Glucose-Capillary: 248 mg/dL — ABNORMAL HIGH (ref 70–99)

## 2018-11-30 MED ORDER — HEPARIN SODIUM (PORCINE) 1000 UNIT/ML IJ SOLN
INTRAMUSCULAR | Status: AC
  Filled 2018-11-30: qty 4

## 2018-11-30 MED ORDER — HEPARIN SODIUM (PORCINE) 1000 UNIT/ML IJ SOLN
INTRAMUSCULAR | Status: AC
  Administered 2018-11-30: 3000 [IU]
  Filled 2018-11-30: qty 3

## 2018-11-30 MED ORDER — HEPARIN SODIUM (PORCINE) 1000 UNIT/ML DIALYSIS: 3000.0000 [IU] | Freq: Once | INTRAMUSCULAR | Status: DC

## 2018-11-30 NOTE — Procedures (Signed)
I was present at this dialysis session. I have reviewed the session itself and made appropriate changes.   Vital signs in last 24 hours:  Temp:  [98.6 F (37 C)-100.2 F (37.9 C)] 99.6 F (37.6 C) (11/06 0701) Pulse Rate:  [85-102] 90 (11/06 0830) Resp:  [15-22] 20 (11/06 0830) BP: (96-123)/(49-68) 105/58 (11/06 0830) SpO2:  [97 %-98 %] 97 % (11/06 0701) Weight:  [104.4 kg] 104.4 kg (11/06 0701) Weight change: -2.6 kg Filed Weights   11/28/18 1401 11/28/18 1822 11/30/18 0701  Weight: 107 kg 104 kg 104.4 kg    Recent Labs  Lab 11/29/18 0120  NA 133*  K 3.0*  CL 93*  CO2 24  GLUCOSE 305*  BUN 29*  CREATININE 4.92*  CALCIUM 7.8*    Recent Labs  Lab 11/27/18 2019 11/29/18 0120  WBC 8.2 13.2*  HGB 8.8* 8.2*  HCT 26.1* 24.3*  MCV 93.5 93.1  PLT 141* 155    Scheduled Meds: . allopurinol  100 mg Oral Daily  . amLODipine  5 mg Oral QHS  . calcium acetate  2,001 mg Oral TID WC  . carbamazepine  800 mg Oral BID  . Chlorhexidine Gluconate Cloth  6 each Topical Q0600  . cholecalciferol  2,000 Units Oral Daily  . feeding supplement (NEPRO CARB STEADY)  237 mL Oral BID BM  . feeding supplement (PRO-STAT SUGAR FREE 64)  30 mL Oral BID  . ferrous sulfate  325 mg Oral Q breakfast  . [START ON 12/01/2018] heparin  3,000 Units Dialysis Once in dialysis  . heparin  5,000 Units Subcutaneous Q8H  . insulin aspart  0-15 Units Subcutaneous TID WC  . insulin aspart  0-5 Units Subcutaneous QHS  . insulin aspart  4 Units Subcutaneous TID WC  . insulin glargine  24 Units Subcutaneous Daily  . lactulose  30 g Oral BID  . multivitamin with minerals  1 tablet Oral QHS  . pantoprazole  20 mg Oral Daily  . PHENobarbital  259.2 mg Oral QHS  . simvastatin  20 mg Oral QHS  . tamsulosin  0.4 mg Oral QHS   Continuous Infusions: . meropenem (MERREM) IV 500 mg (11/30/18 0514)   PRN Meds:.acetaminophen, chlorproMAZINE, cloNIDine, morphine injection, ondansetron (ZOFRAN) IV    D[facility]  [facility]: 4h 73mn, 180NRe, BFR 400, DFR 800, EDW 105.5kg, 3K, 2.25Ca, TDC Heparin 3000 units Mircera 158m IV q 2 weeks- last dose 11/21/2018 Binder: calcium acetate 4 caps TID AC and 2 with snacks  Assessment/Plan: 1. Acute pyelonephritis: CT concerning for left pyelonephritis and with nonobstructive right renal pelvic stone. Afebrile and without leukocytosis. History of klebsiella UTI/bacteremia in 05/2018. On Meropenem, urine and blood cultures 1/2 positive for likely contaminant. On zofran and morning prn nausea/pain. Per primary. 2. ESRD:MWF, missed treatment 11/2 due to pain/nausea. Used 4K bath yesterday, K+ 3.0 this AM. Will give potassium 40 mEq today, follow potassium.  3. Hypertension/volume:BP soft after HD, now below EDW by weights here. Minimal PO intake, will plan minimal UF with HD tomorrow. CXR clear on admission, no peripheral edema on exam.  4. Anemia:Hemoglobin 8.2, ESA recently dosed. Hold IV Fe in the setting of sepsis. Follow hemoglobin. 5. Metabolic bone disease:Calcium 8.8 corrected. Last outpatient phos 8.4. Continue calcium acetate binder. 6. Nutrition:Renal/carb modified diet as tolerated. Continue protein supplement.  7. T2DM: On insulin, per primary.  JoDonetta Potts MD 11/30/2018, 9:09 AM

## 2018-11-30 NOTE — Progress Notes (Addendum)
[facility]  PROGRESS NOTE  Eddie Davies HER:[id number] DOB: [birth year] DOA: 11/27/2018 PCP: Practice, [facility]   Brief HPI:   48 year old male with a history of diabetes mellitus type 2, liver cirrhosis, ESRD on hemodialysis Monday Wednesday Friday, hypertension, history of ESBL/Klebsiella UTI presents with chief complaint of abdominal/flank pain.  He reported 4-day history of left abdominal pain and flank pain.  In the ED urine microscopy showed WBCs 50 with many bacteria.  CT abdomen pelvis findings were concerning for left renal pyelonephritis with 13 mm right renal pelvic stone.  Patient started on meropenem as he had history of Klebsiella UTI bacteremia, ESBL in May 2020.    Subjective   Patient seen and examined during dialysis.  Denies any complaints.   Assessment/Plan:     1. Acute pyelonephritis-seen on CT abdomen pelvis which was concerning for left renal pyelonephritis.  Patient has history of Klebsiella UTI, ESBL.  Started on meropenem.  Follow urine culture results.  Urine culture is growing greater than 100,000 colonies per mL of gram-negative rods.  Follow culture and sensitivity.  2. Nonobstructing right renal pelvic stone-CT showed 13 mm right renal pelvic stone with slight increase in size compared to prior CT from May 2020.  There is no hydronephrosis.  He will need a urology follow-up as discharge.  3. Type II diabetes mellitus-glucose is elevated, increased Lantus to 24 units subcu daily, also added 4 units NovoLog meal coverage.    4. ESRD-patient on hemodialysis Monday Wednesday Friday, missed treatment on 11/26/2018 due to pain and nausea.  Hemodialyzed yesterday.  Nephrology following.  Potassium is 3.2, potassium replacement per nephrology.  5. History of seizures-continue phenobarb, Tegretol  6. Hypertension-continue amlodipine, clonidine.  7. ESRD-continue lactulose  8. GERD-continue PPI  9. Hyperlipidemia-continue  Zocor  10. BPH-continue Flomax  11. Anemia of chronic disease-secondary to ESRD, hemoglobin is down to 7.3, was given ESA per nephrology.  Follow CBC in a.m.     CBG: Recent Labs  Lab 11/28/18 2143 11/29/18 0757 11/29/18 1232 11/29/18 1723 11/29/18 2149  GLUCAP 304* 316* 331* 227* 271*    CBC: Recent Labs  Lab 11/27/18 2019 11/29/18 0120 11/30/18 0500  WBC 8.2 13.2* 10.9*  HGB 8.8* 8.2* 7.3*  HCT 26.1* 24.3* 21.7*  MCV 93.5 93.1 93.5  PLT 141* 155 126*    Basic Metabolic Panel: Recent Labs  Lab 11/27/18 2019 11/28/18 0956 11/29/18 0120 11/30/18 0500  NA 135 138 133* 132*  K 3.1* 4.2 3.0* 3.2*  CL 100 100 93* 94*  CO2 19* 16* 24 22  GLUCOSE 336* 524* 305* 385*  BUN 66* 77* 29* 51*  CREATININE 7.61* 8.56* 4.92* 7.61*  CALCIUM 7.9* 7.7* 7.8* 7.6*  MG  --  2.2  --   --      DVT prophylaxis: Heparin  Code Status: Full code  Family Communication: No family at bedside  Disposition Plan: likely home when medically ready for discharge Pressure Injury 04/21/18 Stage II -  Partial thickness loss of dermis presenting as a shallow open ulcer with a red, pink wound bed without slough. redness, possible previous injury (Active)  04/21/18 2200  Location: Coccyx  Location Orientation: Medial  Staging: Stage II -  Partial thickness loss of dermis presenting as a shallow open ulcer with a red, pink wound bed without slough.  Wound Description (Comments): redness, possible previous injury  Present on Admission: Yes     BMI  Estimated body mass index is 29.55 kg/m as calculated  from the following:   Height as of this encounter: 6' 2"  (1.88 m).   Weight as of this encounter: 104.4 kg.  Scheduled medications:  . allopurinol  100 mg Oral Daily  . amLODipine  5 mg Oral QHS  . calcium acetate  2,001 mg Oral TID WC  . carbamazepine  800 mg Oral BID  . Chlorhexidine Gluconate Cloth  6 each Topical Q0600  . cholecalciferol  2,000 Units Oral Daily  . feeding  supplement (NEPRO CARB STEADY)  237 mL Oral BID BM  . feeding supplement (PRO-STAT SUGAR FREE 64)  30 mL Oral BID  . ferrous sulfate  325 mg Oral Q breakfast  . [START ON 12/01/2018] heparin  3,000 Units Dialysis Once in dialysis  . heparin  5,000 Units Subcutaneous Q8H  . insulin aspart  0-15 Units Subcutaneous TID WC  . insulin aspart  0-5 Units Subcutaneous QHS  . insulin aspart  4 Units Subcutaneous TID WC  . insulin glargine  24 Units Subcutaneous Daily  . lactulose  30 g Oral BID  . multivitamin with minerals  1 tablet Oral QHS  . pantoprazole  20 mg Oral Daily  . PHENobarbital  259.2 mg Oral QHS  . simvastatin  20 mg Oral QHS  . tamsulosin  0.4 mg Oral QHS    Consultants:  Nephrology  Procedures:     Antibiotics:   Anti-infectives (From admission, onward)   Start     Dose/Rate Route Frequency Ordered Stop   11/28/18 0600  meropenem (MERREM) 1 g in sodium chloride 0.9 % 100 mL IVPB  Status:  Discontinued     1 g 200 mL/hr over 30 Minutes Intravenous Every 8 hours 11/28/18 0419 11/28/18 0420   11/28/18 0500  meropenem (MERREM) 500 mg in sodium chloride 0.9 % 100 mL IVPB     500 mg 200 mL/hr over 30 Minutes Intravenous Every 24 hours 11/28/18 0420         Objective   Vitals:   11/30/18 0800 11/30/18 0830 11/30/18 0900 11/30/18 0930  BP: (!) 104/49 (!) 105/58 (!) 111/59 (!) 107/55  Pulse: 91 90 94 94  Resp: 15 20    Temp:      TempSrc:      SpO2:      Weight:      Height:       No intake or output data in the 24 hours ending 11/30/18 1049 Filed Weights   11/28/18 1401 11/28/18 1822 11/30/18 0701  Weight: 107 kg 104 kg 104.4 kg     Physical Examination:    General-appears in no acute distress  Heart-S1-S2, regular, no murmur auscultated  Lungs-clear to auscultation bilaterally, no wheezing or crackles auscultated  Abdomen-soft, nontender, no organomegaly  Extremities-no edema in the lower extremities  Neuro-alert, oriented x3, no focal  deficit noted    Data Reviewed: I have personally reviewed following labs and imaging studies   Recent Results (from the past 240 hour(s))  Urine culture     Status: Abnormal (Preliminary result)   Collection Time: 11/28/18  4:11 AM   Specimen: Urine, Random  Result Value Ref Range Status   Specimen Description URINE, RANDOM  Final   Special Requests NONE  Final   Culture (A)  Final    >=100,000 COLONIES/mL GRAM NEGATIVE RODS IDENTIFICATION AND SUSCEPTIBILITIES TO FOLLOW CULTURE REINCUBATED FOR BETTER GROWTH Performed at [facility], [address] [place], [place] [address]    Report Status PENDING  Incomplete  Blood culture (routine x 2)     Status: None (Preliminary result)   Collection Time: 11/28/18  4:15 AM   Specimen: BLOOD LEFT HAND  Result Value Ref Range Status   Specimen Description BLOOD LEFT HAND  Final   Special Requests   Final    BOTTLES DRAWN AEROBIC AND ANAEROBIC Blood Culture results may not be optimal due to an inadequate volume of blood received in culture bottles   Culture  Setup Time   Final    GRAM POSITIVE COCCI IN CLUSTERS IN BOTH AEROBIC AND ANAEROBIC BOTTLES CRITICAL RESULT CALLED TO, READ BACK BY AND VERIFIED WITH: G. ABBOTT,PHARMD 0425 11/29/2018 Mena Goes Performed at [facility], [place] [address] [place], [place] [address]    Culture GRAM POSITIVE COCCI  Final   Report Status PENDING  Incomplete  Blood culture (routine x 2)     Status: None (Preliminary result)   Collection Time: 11/28/18  5:26 AM   Specimen: BLOOD RIGHT HAND  Result Value Ref Range Status   Specimen Description BLOOD RIGHT HAND  Final   Special Requests   Final    BOTTLES DRAWN AEROBIC AND ANAEROBIC Blood Culture results may not be optimal due to an inadequate volume of blood received in culture bottles   Culture   Final    NO GROWTH 1 DAY Performed at [facility], [place] [address], [place], [place] [address]    Report Status PENDING  Incomplete  SARS  CORONAVIRUS 2 (TAT 6-24 HRS) Nasopharyngeal Nasopharyngeal Swab     Status: None   Collection Time: 11/28/18  6:48 AM   Specimen: Nasopharyngeal Swab  Result Value Ref Range Status   SARS Coronavirus 2 NEGATIVE NEGATIVE Final    Comment: (NOTE) SARS-CoV-2 target nucleic acids are NOT DETECTED. The SARS-CoV-2 RNA is generally detectable in upper and lower respiratory specimens during the acute phase of infection. Negative results do not preclude SARS-CoV-2 infection, do not rule out co-infections with other pathogens, and should not be used as the sole basis for treatment or other patient management decisions. Negative results must be combined with clinical observations, patient history, and epidemiological information. The expected result is Negative. Fact Sheet for Patients: SugarRoll.be Fact Sheet for Healthcare Providers: https://www.woods-mathews.com/ This test is not yet approved or cleared by the [place] FDA and  has been authorized for detection and/or diagnosis of SARS-CoV-2 by FDA under an Emergency Use Authorization (EUA). This EUA will remain  in effect (meaning this test can be used) for the duration of the COVID-19 declaration under Section 56 4(b)(1) of the Act, 21 U.S.C. section 360bbb-3(b)(1), unless the authorization is terminated or revoked sooner. Performed at [facility], [place] [address], [place], [place] [address]      Liver Function Tests: Recent Labs  Lab 11/27/18 2019  AST 17  ALT 14  ALKPHOS 125  BILITOT 0.7  PROT 6.5  ALBUMIN 2.7*   Recent Labs  Lab 11/27/18 2019  LIPASE 11   No results for input(s): AMMONIA in the last 168 hours.  Cardiac Enzymes: No results for input(s): CKTOTAL, CKMB, CKMBINDEX, TROPONINI in the last 168 hours. BNP (last 3 results) Recent Labs    04/21/18 2209  BNP 254.9*    ProBNP (last 3 results) No results for input(s): PROBNP in the last 8760  hours.    Studies: No results found.   Admission status: Inpatient: Based on patients clinical presentation and evaluation of above clinical data, I have made  determination that patient meets Inpatient criteria at this time.   [facility] Pager [phone]. If 7PM-7AM, please contact night-coverage at [email or web address], Office  [phone]  password [email or web address]  11/30/2018, 10:49 AM  LOS: 2 days

## 2018-12-01 LAB — BASIC METABOLIC PANEL
Anion gap: 15 (ref 5–15)
BUN: 28 mg/dL — ABNORMAL HIGH (ref 6–20)
CO2: 21 mmol/L — ABNORMAL LOW (ref 22–32)
Calcium: 7.9 mg/dL — ABNORMAL LOW (ref 8.9–10.3)
Chloride: 98 mmol/L (ref 98–111)
Creatinine, Ser: 5.56 mg/dL — ABNORMAL HIGH (ref 0.61–1.24)
GFR calc Af Amer: 13 mL/min — ABNORMAL LOW (ref >60)
GFR calc non Af Amer: 11 mL/min — ABNORMAL LOW (ref >60)
Glucose, Bld: 259 mg/dL — ABNORMAL HIGH (ref 70–99)
Potassium: 2.7 mmol/L — CL (ref 3.5–5.1)
Sodium: 134 mmol/L — ABNORMAL LOW (ref 135–145)

## 2018-12-01 LAB — URINE CULTURE: Culture: >=100000 — AB

## 2018-12-01 LAB — GLUCOSE, CAPILLARY
Glucose-Capillary: 269 mg/dL — ABNORMAL HIGH (ref 70–99)
Glucose-Capillary: 213 mg/dL — ABNORMAL HIGH (ref 70–99)
Glucose-Capillary: 148 mg/dL — ABNORMAL HIGH (ref 70–99)
Glucose-Capillary: 235 mg/dL — ABNORMAL HIGH (ref 70–99)

## 2018-12-01 MED ORDER — SODIUM CHLORIDE 0.9 % IV SOLN
1.0000 g | Freq: Two times a day (BID) | INTRAVENOUS | Status: DC
  Administered 2018-12-01 – 2018-12-02 (×2): 1 g via INTRAVENOUS
  Filled 2018-12-01 (×3): qty 1

## 2018-12-01 MED ORDER — POTASSIUM CHLORIDE CRYS ER 20 MEQ PO TBCR
30.0000 meq | EXTENDED_RELEASE_TABLET | Freq: Three times a day (TID) | ORAL | Status: AC
  Administered 2018-12-01 (×2): 30 meq via ORAL
  Filled 2018-12-01 (×2): qty 1

## 2018-12-01 MED ORDER — INSULIN GLARGINE 100 UNIT/ML ~~LOC~~ SOLN
28.0000 [IU] | Freq: Every day | SUBCUTANEOUS | Status: DC
  Administered 2018-12-02 – 2018-12-05 (×4): 28 [IU] via SUBCUTANEOUS
  Filled 2018-12-01 (×4): qty 0.28

## 2018-12-01 NOTE — Progress Notes (Addendum)
[facility] Progress Note   Subjective:   Completed HD yesterday net UF 1L. Febrile overnight. Seen in room. Still with hiccups, abd discomfort, nausea.   Objective Vitals:   11/30/18 1125 11/30/18 1253 11/30/18 2200 12/01/18 0527  BP: (!) 104/58 99/60 123/66 110/61  Pulse: 88 93 92 92  Resp: 20 19 16 18   Temp: 98.9 F (37.2 C) 98.7 F (37.1 C) 100.2 F (37.9 C) (!) 101.2 F (38.4 C)  TempSrc: Oral Oral Oral Oral  SpO2: 98% 99% 96% 97%  Weight: 102.3 kg     Height:       Physical Exam General: WNWD male NAD  Heart: RRR,  Lungs: CTA bilaterally  Abdomen: Soft, non-tender, non-distended.  Extremities: No LE edema  Dialysis Access: RIJ TDC, RUE AVF + bruit  Additional Objective Labs: Basic Metabolic Panel: Recent Labs  Lab 11/29/18 0120 11/30/18 0500 12/01/18 0950  NA 133* 132* 134*  K 3.0* 3.2* 2.7*  CL 93* 94* 98  CO2 24 22 21*  GLUCOSE 305* 385* 259*  BUN 29* 51* 28*  CREATININE 4.92* 7.61* 5.56*  CALCIUM 7.8* 7.6* 7.9*   Liver Function Tests: Recent Labs  Lab 11/27/18 2019  AST 17  ALT 14  ALKPHOS 125  BILITOT 0.7  PROT 6.5  ALBUMIN 2.7*   Recent Labs  Lab 11/27/18 2019  LIPASE 11   CBC: Recent Labs  Lab 11/27/18 2019 11/29/18 0120 11/30/18 0500  WBC 8.2 13.2* 10.9*  HGB 8.8* 8.2* 7.3*  HCT 26.1* 24.3* 21.7*  MCV 93.5 93.1 93.5  PLT 141* 155 126*   Blood Culture    Component Value Date/Time   SDES BLOOD RIGHT HAND 11/28/2018 0526   SPECREQUEST  11/28/2018 0526    BOTTLES DRAWN AEROBIC AND ANAEROBIC Blood Culture results may not be optimal due to an inadequate volume of blood received in culture bottles   CULT  11/28/2018 0526    NO GROWTH 3 DAYS Performed at [facility], [place] [address], [place], [place] [address]    REPTSTATUS PENDING 11/28/2018 0526    CBG: Recent Labs  Lab 11/30/18 1245 11/30/18 1658 11/30/18 2221 12/01/18 0755 12/01/18 1207  GLUCAP 206* 320* 248* 269* 213*     Studies/Results: No results found. Medications: . meropenem (MERREM) IV     . allopurinol  100 mg Oral Daily  . amLODipine  5 mg Oral QHS  . calcium acetate  2,001 mg Oral TID WC  . carbamazepine  800 mg Oral BID  . Chlorhexidine Gluconate Cloth  6 each Topical Q0600  . cholecalciferol  2,000 Units Oral Daily  . feeding supplement (NEPRO CARB STEADY)  237 mL Oral BID BM  . feeding supplement (PRO-STAT SUGAR FREE 64)  30 mL Oral BID  . ferrous sulfate  325 mg Oral Q breakfast  . heparin  5,000 Units Subcutaneous Q8H  . insulin aspart  0-15 Units Subcutaneous TID WC  . insulin aspart  0-5 Units Subcutaneous QHS  . insulin aspart  4 Units Subcutaneous TID WC  . [START ON 12/02/2018] insulin glargine  28 Units Subcutaneous Daily  . lactulose  30 g Oral BID  . multivitamin with minerals  1 tablet Oral QHS  . pantoprazole  20 mg Oral Daily  . PHENobarbital  259.2 mg Oral QHS  . potassium chloride  30 mEq Oral TID  . simvastatin  20 mg Oral QHS  . tamsulosin  0.4 mg Oral QHS    Dialysis Orders: Center: [facility]  on MWF. Time: 4h 54mn, 180NRe, BFR 400, DFR 800, EDW 105.5kg, 3K, 2.25Ca, TDC Heparin 3000 units Mircera 157m IV q 2 weeks- last dose 11/21/2018 Binder: calcium acetate 4 caps TID AC and 2 with snacks  Assessment/Plan: 1. Acute pyelonephritis: CT concerning for left pyelonephritis and with nonobstructive right renal pelvic stone. Hx of ESBL UTI.  Urine cx + >100,000 col Klebsiella. On Meropenem per primary.  2. ESRD:  HD MWF. Using 4K bath. Next HD 11/9.  3. Hypokalemia. Supplementing. 30 mEq KCl x2 ordered.  4. Hypertension/volume:  Below EDW by weights here. Post HD wt 102kg. 5. Anemia: Hemoglobin 8.2 >7.3. Transfuse prn. Next ESA due 11/11.  Holding IV Fe in the setting of sepsis. 6. Metabolic bone disease: Calcium 8.8 corrected.  Continue calcium acetate binder.  7. Seizures: on Tegretol, phenobarbital  8. T2DM: On insulin, per primary.    OgLynnda ChildA-C [facility] Pager [phone]/07/2018,12:50 PM  Pt seen, examined and agree w A/P as above. WBC down but temps going up. On broad spec abx.  RoKelly SplinterMD 12/01/2018, 1:58 PM

## 2018-12-01 NOTE — Progress Notes (Signed)
Pt noted with temp 101.2. PRN Tylenol 640m given at 2134. Notified on call doctor Bodenheimer via text page.

## 2018-12-01 NOTE — TOC Progression Note (Signed)
Transition of Care [facility]) - Progression Note    Patient Details  Name: Eddie Davies MRN: [id number] Date of Birth: 05-17-1970  Transition [facility] CM/SW [place], [place] Phone Number: 12/01/2018, 9:20 AM  Clinical Narrative:    Continuing to follow for transition back to [facility].  Requested a new COVID swab for pt.    Expected Discharge Plan: [place] Barriers to Discharge: Continued Medical Work up  Expected Discharge Plan and Services Expected Discharge Plan: [place] In-house Referral: Clinical Social Work Discharge Planning Services: CM Consult Post Acute Care Choice: Resumption of Svcs/PTA Provider, [place] Living arrangements for the past 2 months: [place]  Social Determinants of Health (SDOH) Interventions    Readmission Risk Interventions Readmission Risk Prevention Plan 11/29/2018 05/28/2018 05/23/2018  Transportation Screening Complete - Complete  PCP or Specialist Appt within 5-7 Days - - -  Home Care Screening - - -  Medication Review (RN CM) - - -  Medication Review (RN Transport planner) Complete - Complete  PCP or Specialist appointment within 3-5 days of discharge Not Complete - -  PCP/Specialist Appt Not Complete comments pt is SNF resident - -  [place] or [facility] Not Complete Complete (No Data)  [place] or Home Care Consult Pt Refusal Comments pt is SNF resident - -  SW Recovery Care/Counseling Consult Complete Complete -  Palliative Care Screening Not Applicable - Not Applicable  Skilled Nursing Facility Complete - Complete  Some recent data might be hidden

## 2018-12-01 NOTE — Progress Notes (Addendum)
[facility]  PROGRESS NOTE  Eddie Davies UEA:[id number] DOB: [birth year] DOA: 11/27/2018 PCP: [facility]   Brief HPI:   48 year old male with a history of diabetes mellitus type 2, liver cirrhosis, ESRD on hemodialysis Monday Wednesday Friday, hypertension, history of ESBL/Klebsiella UTI presents with chief complaint of abdominal/flank pain.  He reported 4-day history of left abdominal pain and flank pain.  In the ED urine microscopy showed WBCs 50 with many bacteria.  CT abdomen pelvis findings were concerning for left renal pyelonephritis with 13 mm right renal pelvic stone.  Patient started on meropenem as he had history of Klebsiella UTI bacteremia, ESBL in May 2020.    Subjective   Patient seen and examined, complains of hiccups. Still febrile.   Assessment/Plan:     1. Acute pyelonephritis-seen on CT abdomen pelvis which was concerning for left renal pyelonephritis.  Patient has history of Klebsiella UTI, ESBL.  Started on meropenem.  Follow urine culture results.  Urine culture is growing greater than 100,000 colonies per mL of ESBL Klebsiella pneumoniae  2. Nonobstructing right renal pelvic stone-CT showed 13 mm right renal pelvic stone with slight increase in size compared to prior CT from May 2020.  There is no hydronephrosis.  He will need a urology follow-up as discharge.  3. Type II diabetes mellitus-glucose is still elevated, despite increasing Lantus to 24 units subcu daily.  Will change Lantus to 28 units subcu daily, continue 4 units NovoLog meal coverage 3 times daily.  Sliding scale insulin.     4. ESRD-patient on hemodialysis Monday Wednesday Friday, missed treatment on 11/26/2018 due to pain and nausea.  Hemodialyzed yesterday.  Nephrology following.  Potassium is 3.2, potassium replacement per nephrology.  5. Hiccups- continue Thorazine 25 mg tid prn  6. History of seizures-continue phenobarb, Tegretol  7. Hypertension-continue  amlodipine, clonidine.  8. ESRD-continue lactulose  9. GERD-continue PPI  10. Hyperlipidemia-continue Zocor  11. BPH-continue Flomax  12. Anemia of chronic disease-secondary to ESRD, hemoglobin is down to 7.3, was given ESA per nephrology.  Follow CBC in a.m.     CBG: Recent Labs  Lab 11/29/18 2149 11/30/18 1245 11/30/18 1658 11/30/18 2221 12/01/18 0755  GLUCAP 271* 206* 320* 248* 269*    CBC: Recent Labs  Lab 11/27/18 2019 11/29/18 0120 11/30/18 0500  WBC 8.2 13.2* 10.9*  HGB 8.8* 8.2* 7.3*  HCT 26.1* 24.3* 21.7*  MCV 93.5 93.1 93.5  PLT 141* 155 126*    Basic Metabolic Panel: Recent Labs  Lab 11/27/18 2019 11/28/18 0956 11/29/18 0120 11/30/18 0500  NA 135 138 133* 132*  K 3.1* 4.2 3.0* 3.2*  CL 100 100 93* 94*  CO2 19* 16* 24 22  GLUCOSE 336* 524* 305* 385*  BUN 66* 77* 29* 51*  CREATININE 7.61* 8.56* 4.92* 7.61*  CALCIUM 7.9* 7.7* 7.8* 7.6*  MG  --  2.2  --   --      DVT prophylaxis: Heparin  Code Status: Full code  Family Communication: No family at bedside  Disposition Plan: likely home when medically ready for discharge Pressure Injury 04/21/18 Stage II -  Partial thickness loss of dermis presenting as a shallow open ulcer with a red, pink wound bed without slough. redness, possible previous injury (Active)  04/21/18 2200  Location: Coccyx  Location Orientation: Medial  Staging: Stage II -  Partial thickness loss of dermis presenting as a shallow open ulcer with a red, pink wound bed without slough.  Wound Description (Comments): redness, possible  previous injury  Present on Admission: Yes     BMI  Estimated body mass index is 28.96 kg/m as calculated from the following:   Height as of this encounter: 6' 2"  (1.88 m).   Weight as of this encounter: 102.3 kg.  Scheduled medications:  . allopurinol  100 mg Oral Daily  . amLODipine  5 mg Oral QHS  . calcium acetate  2,001 mg Oral TID WC  . carbamazepine  800 mg Oral BID  .  Chlorhexidine Gluconate Cloth  6 each Topical Q0600  . cholecalciferol  2,000 Units Oral Daily  . feeding supplement (NEPRO CARB STEADY)  237 mL Oral BID BM  . feeding supplement (PRO-STAT SUGAR FREE 64)  30 mL Oral BID  . ferrous sulfate  325 mg Oral Q breakfast  . heparin  5,000 Units Subcutaneous Q8H  . insulin aspart  0-15 Units Subcutaneous TID WC  . insulin aspart  0-5 Units Subcutaneous QHS  . insulin aspart  4 Units Subcutaneous TID WC  . insulin glargine  24 Units Subcutaneous Daily  . lactulose  30 g Oral BID  . multivitamin with minerals  1 tablet Oral QHS  . pantoprazole  20 mg Oral Daily  . PHENobarbital  259.2 mg Oral QHS  . simvastatin  20 mg Oral QHS  . tamsulosin  0.4 mg Oral QHS    Consultants:  Nephrology  Procedures:     Antibiotics:   Anti-infectives (From admission, onward)   Start     Dose/Rate Route Frequency Ordered Stop   11/28/18 0600  meropenem (MERREM) 1 g in sodium chloride 0.9 % 100 mL IVPB  Status:  Discontinued     1 g 200 mL/hr over 30 Minutes Intravenous Every 8 hours 11/28/18 0419 11/28/18 0420   11/28/18 0500  meropenem (MERREM) 500 mg in sodium chloride 0.9 % 100 mL IVPB     500 mg 200 mL/hr over 30 Minutes Intravenous Every 24 hours 11/28/18 0420         Objective   Vitals:   11/30/18 1125 11/30/18 1253 11/30/18 2200 12/01/18 0527  BP: (!) 104/58 99/60 123/66 110/61  Pulse: 88 93 92 92  Resp: 20 19 16 18   Temp: 98.9 F (37.2 C) 98.7 F (37.1 C) 100.2 F (37.9 C) (!) 101.2 F (38.4 C)  TempSrc: Oral Oral Oral Oral  SpO2: 98% 99% 96% 97%  Weight: 102.3 kg     Height:        Intake/Output Summary (Last 24 hours) at 12/01/2018 1011 Last data filed at 12/01/2018 0900 Gross per 24 hour  Intake 0 ml  Output 1000 ml  Net -1000 ml   Filed Weights   11/28/18 1822 11/30/18 0701 11/30/18 1125  Weight: 104 kg 104.4 kg 102.3 kg     Physical Examination:    General-appears in no acute distress  Heart-S1-S2, regular,  no murmur auscultated  Lungs-clear to auscultation bilaterally, no wheezing or crackles auscultated  Abdomen-soft, nontender, no organomegaly  Extremities-no edema in the lower extremities  Neuro-alert, oriented x3, no focal deficit noted    Data Reviewed: I have personally reviewed following labs and imaging studies   Recent Results (from the past 240 hour(s))  Urine culture     Status: Abnormal   Collection Time: 11/28/18  4:11 AM   Specimen: Urine, Random  Result Value Ref Range Status   Specimen Description URINE, RANDOM  Final   Special Requests   Final    NONE Performed at [facility], [place] [address], [place], [place] [address]    Culture (A)  Final    >=100,000 COLONIES/mL KLEBSIELLA PNEUMONIAE Confirmed Extended Spectrum Beta-Lactamase Producer (ESBL).  In bloodstream infections from ESBL organisms, carbapenems are preferred over piperacillin/tazobactam. They are shown to have a lower risk of mortality.    Report Status 12/01/2018 FINAL  Final   Organism ID, Bacteria KLEBSIELLA PNEUMONIAE (A)  Final      Susceptibility   Klebsiella pneumoniae - MIC*    AMPICILLIN >=32 RESISTANT Resistant     CEFAZOLIN RESISTANT Resistant     CEFTRIAXONE RESISTANT Resistant     CIPROFLOXACIN 0.5 SENSITIVE Sensitive     GENTAMICIN 8 INTERMEDIATE Intermediate     IMIPENEM <=0.25 SENSITIVE Sensitive     NITROFURANTOIN 64 INTERMEDIATE Intermediate     TRIMETH/SULFA >=320 RESISTANT Resistant     AMPICILLIN/SULBACTAM 8 SENSITIVE Sensitive     PIP/TAZO <=4 SENSITIVE Sensitive     Extended ESBL POSITIVE Resistant     * >=100,000 COLONIES/mL KLEBSIELLA PNEUMONIAE  Blood culture (routine x 2)     Status: Abnormal   Collection Time: 11/28/18  4:15 AM   Specimen: BLOOD LEFT HAND  Result Value Ref Range Status   Specimen Description BLOOD LEFT HAND  Final   Special Requests   Final    BOTTLES DRAWN AEROBIC AND ANAEROBIC Blood Culture results may not be optimal due to an inadequate  volume of blood received in culture bottles   Culture  Setup Time   Final    GRAM POSITIVE COCCI IN CLUSTERS IN BOTH AEROBIC AND ANAEROBIC BOTTLES CRITICAL RESULT CALLED TO, READ BACK BY AND VERIFIED WITH: G. ABBOTT,PHARMD 0425 11/29/2018 T. TYSOR    Culture (A)  Final    STAPHYLOCOCCUS SPECIES (COAGULASE NEGATIVE) THE SIGNIFICANCE OF ISOLATING THIS ORGANISM FROM A SINGLE SET OF BLOOD CULTURES WHEN MULTIPLE SETS ARE DRAWN IS UNCERTAIN. PLEASE NOTIFY THE MICROBIOLOGY DEPARTMENT WITHIN ONE WEEK IF SPECIATION AND SENSITIVITIES ARE REQUIRED. Performed at [facility], [place] [address] [place], [place] [address]    Report Status 11/30/2018 FINAL  Final  Blood culture (routine x 2)     Status: None (Preliminary result)   Collection Time: 11/28/18  5:26 AM   Specimen: BLOOD RIGHT HAND  Result Value Ref Range Status   Specimen Description BLOOD RIGHT HAND  Final   Special Requests   Final    BOTTLES DRAWN AEROBIC AND ANAEROBIC Blood Culture results may not be optimal due to an inadequate volume of blood received in culture bottles   Culture   Final    NO GROWTH 3 DAYS Performed at [facility], [place] [address] [place], [place] [address]    Report Status PENDING  Incomplete  SARS CORONAVIRUS 2 (TAT 6-24 HRS) Nasopharyngeal Nasopharyngeal Swab     Status: None   Collection Time: 11/28/18  6:48 AM   Specimen: Nasopharyngeal Swab  Result Value Ref Range Status   SARS Coronavirus 2 NEGATIVE NEGATIVE Final    Comment: (NOTE) SARS-CoV-2 target nucleic acids are NOT DETECTED. The SARS-CoV-2 RNA is generally detectable in upper and lower respiratory specimens during the acute phase of infection. Negative results do not preclude SARS-CoV-2 infection, do not rule out co-infections with other pathogens, and should not be used as the sole basis for treatment or other patient management decisions. Negative results must be combined with clinical observations, patient history, and  epidemiological information. The expected result is Negative. Fact Sheet for Patients: SugarRoll.be Fact  Sheet for Healthcare Providers: https://www.woods-mathews.com/ This test is not yet approved or cleared by the [place] FDA and  has been authorized for detection and/or diagnosis of SARS-CoV-2 by FDA under an Emergency Use Authorization (EUA). This EUA will remain  in effect (meaning this test can be used) for the duration of the COVID-19 declaration under Section 56 4(b)(1) of the Act, 21 U.S.C. section 360bbb-3(b)(1), unless the authorization is terminated or revoked sooner. Performed at [facility], [place] [address] [place], [place] [address]      Liver Function Tests: Recent Labs  Lab 11/27/18 2019  AST 17  ALT 14  ALKPHOS 125  BILITOT 0.7  PROT 6.5  ALBUMIN 2.7*   Recent Labs  Lab 11/27/18 2019  LIPASE 11   No results for input(s): AMMONIA in the last 168 hours.  Cardiac Enzymes: No results for input(s): CKTOTAL, CKMB, CKMBINDEX, TROPONINI in the last 168 hours. BNP (last 3 results) Recent Labs    04/21/18 2209  BNP 254.9*    ProBNP (last 3 results) No results for input(s): PROBNP in the last 8760 hours.    Studies: No results found.   Admission status: Inpatient: Based on patients clinical presentation and evaluation of above clinical data, I have made determination that patient meets Inpatient criteria at this time.   [facility] Pager [phone]. If 7PM-7AM, please contact night-coverage at [email or web address], Office  [phone]  password [email or web address]  12/01/2018, 10:11 AM  LOS: 3 days

## 2018-12-02 LAB — BASIC METABOLIC PANEL
Anion gap: 17 — ABNORMAL HIGH (ref 5–15)
BUN: 39 mg/dL — ABNORMAL HIGH (ref 6–20)
CO2: 20 mmol/L — ABNORMAL LOW (ref 22–32)
Calcium: 8 mg/dL — ABNORMAL LOW (ref 8.9–10.3)
Chloride: 96 mmol/L — ABNORMAL LOW (ref 98–111)
Creatinine, Ser: 7.09 mg/dL — ABNORMAL HIGH (ref 0.61–1.24)
GFR calc Af Amer: 10 mL/min — ABNORMAL LOW (ref >60)
GFR calc non Af Amer: 8 mL/min — ABNORMAL LOW (ref >60)
Glucose, Bld: 362 mg/dL — ABNORMAL HIGH (ref 70–99)
Potassium: 3.5 mmol/L (ref 3.5–5.1)
Sodium: 133 mmol/L — ABNORMAL LOW (ref 135–145)

## 2018-12-02 LAB — GLUCOSE, CAPILLARY
Glucose-Capillary: 320 mg/dL — ABNORMAL HIGH (ref 70–99)
Glucose-Capillary: 313 mg/dL — ABNORMAL HIGH (ref 70–99)
Glucose-Capillary: 124 mg/dL — ABNORMAL HIGH (ref 70–99)
Glucose-Capillary: 208 mg/dL — ABNORMAL HIGH (ref 70–99)

## 2018-12-02 LAB — SARS CORONAVIRUS 2 (TAT 6-24 HRS): SARS Coronavirus 2: NEGATIVE

## 2018-12-02 LAB — POTASSIUM: Potassium: 3.5 mmol/L (ref 3.5–5.1)

## 2018-12-02 MED ORDER — FAMOTIDINE 20 MG PO TABS
20.0000 mg | ORAL_TABLET | Freq: Every day | ORAL | Status: DC
  Administered 2018-12-02 – 2018-12-05 (×4): 20 mg via ORAL
  Filled 2018-12-02 (×4): qty 1

## 2018-12-02 MED ORDER — SODIUM CHLORIDE 0.9 % IV SOLN
500.0000 mg | INTRAVENOUS | Status: DC
  Administered 2018-12-03: 500 mg via INTRAVENOUS
  Filled 2018-12-02: qty 0.5

## 2018-12-02 MED ORDER — PANTOPRAZOLE SODIUM 40 MG PO TBEC: 40.0000 mg | DELAYED_RELEASE_TABLET | Freq: Every day | ORAL | Status: DC

## 2018-12-02 NOTE — Progress Notes (Addendum)
[facility] Progress Note   Subjective:   Seen in room. Afebrile overnight. Hiccups continue. No new complaints.   Objective Vitals:   12/01/18 1502 12/01/18 2121 12/01/18 2300 12/02/18 0504  BP: 107/62 124/71  115/68  Pulse: 86 93  92  Resp: 18 18  19   Temp: 99.3 F (37.4 C) (!) 101.2 F (38.4 C) 98.6 F (37 C) 99.5 F (37.5 C)  TempSrc: Axillary Oral Oral Oral  SpO2: 96% 98%  94%  Weight:      Height:       Physical Exam General: WNWD male NAD  Heart: RRR,  Lungs: CTA bilaterally  Abdomen: Soft, non-tender, non-distended.  Extremities: No LE edema  Dialysis Access: RIJ TDC, RUE AVF + bruit  Additional Objective Labs: Basic Metabolic Panel: Recent Labs  Lab 11/30/18 0500 12/01/18 0950 12/02/18 0708  NA 132* 134* 133*  K 3.2* 2.7* 3.5  3.5  CL 94* 98 96*  CO2 22 21* 20*  GLUCOSE 385* 259* 362*  BUN 51* 28* 39*  CREATININE 7.61* 5.56* 7.09*  CALCIUM 7.6* 7.9* 8.0*   Liver Function Tests: Recent Labs  Lab 11/27/18 2019  AST 17  ALT 14  ALKPHOS 125  BILITOT 0.7  PROT 6.5  ALBUMIN 2.7*   Recent Labs  Lab 11/27/18 2019  LIPASE 11   CBC: Recent Labs  Lab 11/27/18 2019 11/29/18 0120 11/30/18 0500  WBC 8.2 13.2* 10.9*  HGB 8.8* 8.2* 7.3*  HCT 26.1* 24.3* 21.7*  MCV 93.5 93.1 93.5  PLT 141* 155 126*   Blood Culture    Component Value Date/Time   SDES BLOOD RIGHT HAND 11/28/2018 0526   SPECREQUEST  11/28/2018 0526    BOTTLES DRAWN AEROBIC AND ANAEROBIC Blood Culture results may not be optimal due to an inadequate volume of blood received in culture bottles   CULT  11/28/2018 0526    NO GROWTH 4 DAYS Performed at [facility], [place] [address] [place], [place] [address]    REPTSTATUS PENDING 11/28/2018 0526    CBG: Recent Labs  Lab 12/01/18 0755 12/01/18 1207 12/01/18 1701 12/01/18 2118 12/02/18 0754  GLUCAP 269* 213* 148* 235* 320*    Studies/Results: No results found. Medications: . meropenem (MERREM)  IV 1 g (12/02/18 0517)   . allopurinol  100 mg Oral Daily  . amLODipine  5 mg Oral QHS  . calcium acetate  2,001 mg Oral TID WC  . carbamazepine  800 mg Oral BID  . Chlorhexidine Gluconate Cloth  6 each Topical Q0600  . cholecalciferol  2,000 Units Oral Daily  . famotidine  20 mg Oral Daily  . feeding supplement (NEPRO CARB STEADY)  237 mL Oral BID BM  . feeding supplement (PRO-STAT SUGAR FREE 64)  30 mL Oral BID  . ferrous sulfate  325 mg Oral Q breakfast  . heparin  5,000 Units Subcutaneous Q8H  . insulin aspart  0-15 Units Subcutaneous TID WC  . insulin aspart  0-5 Units Subcutaneous QHS  . insulin aspart  4 Units Subcutaneous TID WC  . insulin glargine  28 Units Subcutaneous Daily  . lactulose  30 g Oral BID  . multivitamin with minerals  1 tablet Oral QHS  . PHENobarbital  259.2 mg Oral QHS  . simvastatin  20 mg Oral QHS  . tamsulosin  0.4 mg Oral QHS    Dialysis: [facility] MWF  4:15   400/800  105.5kg  3K/2.25 bath TDC / R AVF maturing Hep 3000 Mircera  135mg IV q 2 weeks- last dose 11/21/2018 Binder: calcium acetate 4 caps TID AC and 2 with snacks  Assessment/Plan: 1. Acute pyelonephritis: CT concerning for left pyelonephritis. Nonobstructive right renal pelvic stone --F/u urology as OP. Hx of ESBL Klebsiella UTI.  Urine cx + >100,000 col Klebsiella. On Meropenem per primary.  2. [facility]  [facility] Using 4K bath. Next HD 11/9.  3. Hypokalemia. 30 mEq KCl x2 dosed yesterday.  K+ improved 3.5 this am.  4. Hypertension/volume:  Below EDW by weights here. Post HD wt 102kg. 5. Anemia: Hemoglobin 8.2 >7.3. Transfuse prn. Next ESA dose due 11/11.  Holding IV Fe in the setting of sepsis. 6. Metabolic bone disease: Calcium 8.0.   Continue calcium acetate binder. No VDRA.  7. Seizures: on Tegretol, phenobarbital  8. T2DM: On insulin, per primary.   OLynnda ChildPA-C [facility] Pager [phone]/08/2018,12:16 PM  Pt seen, examined  and agree w A/P as above.  RKelly Splinter MD 12/02/2018, 3:18 PM

## 2018-12-02 NOTE — Progress Notes (Signed)
[facility]  PROGRESS NOTE  Eddie Davies OZH:[id number] DOB: [birth year] DOA: 11/27/2018 PCP: [facility], [facility]   Brief HPI:   48 year old male with a history of diabetes mellitus type 2, liver cirrhosis, ESRD on hemodialysis Monday Wednesday Friday, hypertension, history of ESBL/Klebsiella UTI presents with chief complaint of abdominal/flank pain.  He reported 4-day history of left abdominal pain and flank pain.  In the ED urine microscopy showed WBCs 50 with many bacteria.  CT abdomen pelvis findings were concerning for left renal pyelonephritis with 13 mm right renal pelvic stone.  Patient started on meropenem as he had history of Klebsiella UTI bacteremia, ESBL in May 2020.    Subjective   Patient seen and examined, still complains of hiccups.  He was started on Thorazine as needed 2 days ago.   Assessment/Plan:     1. Acute pyelonephritis-seen on CT abdomen pelvis which was concerning for left renal pyelonephritis.  Patient has history of Klebsiella UTI, ESBL.  Started on meropenem.  Follow urine culture results.  Urine culture is growing greater than 100,000 colonies per mL of ESBL Klebsiella pneumoniae  2. Nonobstructing right renal pelvic stone-CT showed 13 mm right renal pelvic stone with slight increase in size compared to prior CT from May 2020.  There is no hydronephrosis.  Called and discussed with neurologist on-call, Dr. Gloriann Loan recommends follow-up with urology as outpatient once infection is cleared.  3. Type II diabetes mellitus-glucose is still elevated, despite increasing Lantus to 24 units subcu daily.  Continue  Lantus to 28 units subcu daily, continue 4 units NovoLog meal coverage 3 times daily.  Sliding scale insulin.     4. ESRD-patient on hemodialysis Monday Wednesday Friday, missed treatment on 11/26/2018 due to pain and nausea.  Hemodialyzed yesterday.  Nephrology following.    5. Hiccups- continue Thorazine 25 mg tid prn.  Patient was  started on Protonix, will discontinue Protonix, start Pepcid 20 mg p.o. daily.  6. History of seizures-continue phenobarb, Tegretol  7. Hypertension-continue amlodipine, clonidine.  8. ESRD-continue lactulose  9. GERD-continue PPI  10. Hyperlipidemia-continue Zocor  11. BPH-continue Flomax  12. Anemia of chronic disease-secondary to ESRD, hemoglobin is down to 7.3, was given ESA per nephrology.  Follow CBC in a.m.     CBG: Recent Labs  Lab 12/01/18 0755 12/01/18 1207 12/01/18 1701 12/01/18 2118 12/02/18 0754  GLUCAP 269* 213* 148* 235* 320*    CBC: Recent Labs  Lab 11/27/18 2019 11/29/18 0120 11/30/18 0500  WBC 8.2 13.2* 10.9*  HGB 8.8* 8.2* 7.3*  HCT 26.1* 24.3* 21.7*  MCV 93.5 93.1 93.5  PLT 141* 155 126*    Basic Metabolic Panel: Recent Labs  Lab 11/28/18 0956 11/29/18 0120 11/30/18 0500 12/01/18 0950 12/02/18 0708  NA 138 133* 132* 134* 133*  K 4.2 3.0* 3.2* 2.7* 3.5  3.5  CL 100 93* 94* 98 96*  CO2 16* 24 22 21* 20*  GLUCOSE 524* 305* 385* 259* 362*  BUN 77* 29* 51* 28* 39*  CREATININE 8.56* 4.92* 7.61* 5.56* 7.09*  CALCIUM 7.7* 7.8* 7.6* 7.9* 8.0*  MG 2.2  --   --   --   --      DVT prophylaxis: Heparin  Code Status: Full code  Family Communication: No family at bedside  Disposition Plan: likely home when medically ready for discharge Pressure Injury 04/21/18 Stage II -  Partial thickness loss of dermis presenting as a shallow open ulcer with a red, pink wound bed without slough. redness, possible  previous injury (Active)  04/21/18 2200  Location: Coccyx  Location Orientation: Medial  Staging: Stage II -  Partial thickness loss of dermis presenting as a shallow open ulcer with a red, pink wound bed without slough.  Wound Description (Comments): redness, possible previous injury  Present on Admission: Yes     BMI  Estimated body mass index is 28.96 kg/m as calculated from the following:   Height as of this encounter: 6' 2"   (1.88 m).   Weight as of this encounter: 102.3 kg.  Scheduled medications:  . allopurinol  100 mg Oral Daily  . amLODipine  5 mg Oral QHS  . calcium acetate  2,001 mg Oral TID WC  . carbamazepine  800 mg Oral BID  . Chlorhexidine Gluconate Cloth  6 each Topical Q0600  . cholecalciferol  2,000 Units Oral Daily  . famotidine  20 mg Oral Daily  . feeding supplement (NEPRO CARB STEADY)  237 mL Oral BID BM  . feeding supplement (PRO-STAT SUGAR FREE 64)  30 mL Oral BID  . ferrous sulfate  325 mg Oral Q breakfast  . heparin  5,000 Units Subcutaneous Q8H  . insulin aspart  0-15 Units Subcutaneous TID WC  . insulin aspart  0-5 Units Subcutaneous QHS  . insulin aspart  4 Units Subcutaneous TID WC  . insulin glargine  28 Units Subcutaneous Daily  . lactulose  30 g Oral BID  . multivitamin with minerals  1 tablet Oral QHS  . PHENobarbital  259.2 mg Oral QHS  . simvastatin  20 mg Oral QHS  . tamsulosin  0.4 mg Oral QHS    Consultants:  Nephrology  Procedures:     Antibiotics:   Anti-infectives (From admission, onward)   Start     Dose/Rate Route Frequency Ordered Stop   12/01/18 1800  meropenem (MERREM) 1 g in sodium chloride 0.9 % 100 mL IVPB     1 g 200 mL/hr over 30 Minutes Intravenous Every 12 hours 12/01/18 1101     11/28/18 0600  meropenem (MERREM) 1 g in sodium chloride 0.9 % 100 mL IVPB  Status:  Discontinued     1 g 200 mL/hr over 30 Minutes Intravenous Every 8 hours 11/28/18 0419 11/28/18 0420   11/28/18 0500  meropenem (MERREM) 500 mg in sodium chloride 0.9 % 100 mL IVPB  Status:  Discontinued     500 mg 200 mL/hr over 30 Minutes Intravenous Every 24 hours 11/28/18 0420 12/01/18 1101       Objective   Vitals:   12/01/18 1502 12/01/18 2121 12/01/18 2300 12/02/18 0504  BP: 107/62 124/71  115/68  Pulse: 86 93  92  Resp: 18 18  19   Temp: 99.3 F (37.4 C) (!) 101.2 F (38.4 C) 98.6 F (37 C) 99.5 F (37.5 C)  TempSrc: Axillary Oral Oral Oral  SpO2: 96% 98%   94%  Weight:      Height:        Intake/Output Summary (Last 24 hours) at 12/02/2018 1028 Last data filed at 12/01/2018 2235 Gross per 24 hour  Intake 300 ml  Output 50 ml  Net 250 ml   Filed Weights   11/28/18 1822 11/30/18 0701 11/30/18 1125  Weight: 104 kg 104.4 kg 102.3 kg     Physical Examination:  General-appears in no acute distress Heart-S1-S2, regular, no murmur auscultated Lungs-clear to auscultation bilaterally, no wheezing or crackles auscultated Abdomen-soft, nontender, no organomegaly Extremities-no edema in the lower extremities Neuro-alert, oriented x3, no focal  deficit noted   Data Reviewed: I have personally reviewed following labs and imaging studies   Recent Results (from the past 240 hour(s))  Urine culture     Status: Abnormal   Collection Time: 11/28/18  4:11 AM   Specimen: Urine, Random  Result Value Ref Range Status   Specimen Description URINE, RANDOM  Final   Special Requests   Final    NONE Performed at [facility], [address] [place], [place] [address]    Culture (A)  Final    >=100,000 COLONIES/mL KLEBSIELLA PNEUMONIAE Confirmed Extended Spectrum Beta-Lactamase Producer (ESBL).  In bloodstream infections from ESBL organisms, carbapenems are preferred over piperacillin/tazobactam. They are shown to have a lower risk of mortality.    Report Status 12/01/2018 FINAL  Final   Organism ID, Bacteria KLEBSIELLA PNEUMONIAE (A)  Final      Susceptibility   Klebsiella pneumoniae - MIC*    AMPICILLIN >=32 RESISTANT Resistant     CEFAZOLIN RESISTANT Resistant     CEFTRIAXONE RESISTANT Resistant     CIPROFLOXACIN 0.5 SENSITIVE Sensitive     GENTAMICIN 8 INTERMEDIATE Intermediate     IMIPENEM <=0.25 SENSITIVE Sensitive     NITROFURANTOIN 64 INTERMEDIATE Intermediate     TRIMETH/SULFA >=320 RESISTANT Resistant     AMPICILLIN/SULBACTAM 8 SENSITIVE Sensitive     PIP/TAZO <=4 SENSITIVE Sensitive     Extended ESBL POSITIVE Resistant      * >=100,000 COLONIES/mL KLEBSIELLA PNEUMONIAE  Blood culture (routine x 2)     Status: Abnormal   Collection Time: 11/28/18  4:15 AM   Specimen: BLOOD LEFT HAND  Result Value Ref Range Status   Specimen Description BLOOD LEFT HAND  Final   Special Requests   Final    BOTTLES DRAWN AEROBIC AND ANAEROBIC Blood Culture results may not be optimal due to an inadequate volume of blood received in culture bottles   Culture  Setup Time   Final    GRAM POSITIVE COCCI IN CLUSTERS IN BOTH AEROBIC AND ANAEROBIC BOTTLES CRITICAL RESULT CALLED TO, READ BACK BY AND VERIFIED WITH: G. ABBOTT,PHARMD 0425 11/29/2018 T. TYSOR    Culture (A)  Final    STAPHYLOCOCCUS SPECIES (COAGULASE NEGATIVE) THE SIGNIFICANCE OF ISOLATING THIS ORGANISM FROM A SINGLE SET OF BLOOD CULTURES WHEN MULTIPLE SETS ARE DRAWN IS UNCERTAIN. PLEASE NOTIFY THE MICROBIOLOGY DEPARTMENT WITHIN ONE WEEK IF SPECIATION AND SENSITIVITIES ARE REQUIRED. Performed at [facility], [place] [address] [place], [place] [address]    Report Status 11/30/2018 FINAL  Final  Blood culture (routine x 2)     Status: None (Preliminary result)   Collection Time: 11/28/18  5:26 AM   Specimen: BLOOD RIGHT HAND  Result Value Ref Range Status   Specimen Description BLOOD RIGHT HAND  Final   Special Requests   Final    BOTTLES DRAWN AEROBIC AND ANAEROBIC Blood Culture results may not be optimal due to an inadequate volume of blood received in culture bottles   Culture   Final    NO GROWTH 4 DAYS Performed at [facility], [place] [address], [place], [place] [address]    Report Status PENDING  Incomplete  SARS CORONAVIRUS 2 (TAT 6-24 HRS) Nasopharyngeal Nasopharyngeal Swab     Status: None   Collection Time: 11/28/18  6:48 AM   Specimen: Nasopharyngeal Swab  Result Value Ref Range Status   SARS Coronavirus 2 NEGATIVE NEGATIVE Final    Comment: (NOTE) SARS-CoV-2 target nucleic acids are NOT DETECTED.  The SARS-CoV-2 RNA is generally detectable in  upper and lower respiratory specimens during the acute phase of infection. Negative results do not preclude SARS-CoV-2 infection, do not rule out co-infections with other pathogens, and should not be used as the sole basis for treatment or other patient management decisions. Negative results must be combined with clinical observations, patient history, and epidemiological information. The expected result is Negative. Fact Sheet for Patients: SugarRoll.be Fact Sheet for Healthcare Providers: https://www.woods-mathews.com/ This test is not yet approved or cleared by the [place] FDA and  has been authorized for detection and/or diagnosis of SARS-CoV-2 by FDA under an Emergency Use Authorization (EUA). This EUA will remain  in effect (meaning this test can be used) for the duration of the COVID-19 declaration under Section 56 4(b)(1) of the Act, 21 U.S.C. section 360bbb-3(b)(1), unless the authorization is terminated or revoked sooner. Performed at [facility], [place] [address], [place], [place] [address]   SARS CORONAVIRUS 2 (TAT 6-24 HRS) Nasopharyngeal Nasopharyngeal Swab     Status: None   Collection Time: 12/01/18  9:26 AM   Specimen: Nasopharyngeal Swab  Result Value Ref Range Status   SARS Coronavirus 2 NEGATIVE NEGATIVE Final    Comment: (NOTE) SARS-CoV-2 target nucleic acids are NOT DETECTED. The SARS-CoV-2 RNA is generally detectable in upper and lower respiratory specimens during the acute phase of infection. Negative results do not preclude SARS-CoV-2 infection, do not rule out co-infections with other pathogens, and should not be used as the sole basis for treatment or other patient management decisions. Negative results must be combined with clinical observations, patient history, and epidemiological information. The expected result is Negative. Fact Sheet for Patients: SugarRoll.be Fact  Sheet for Healthcare Providers: https://www.woods-mathews.com/ This test is not yet approved or cleared by the [place] FDA and  has been authorized for detection and/or diagnosis of SARS-CoV-2 by FDA under an Emergency Use Authorization (EUA). This EUA will remain  in effect (meaning this test can be used) for the duration of the COVID-19 declaration under Section 56 4(b)(1) of the Act, 21 U.S.C. section 360bbb-3(b)(1), unless the authorization is terminated or revoked sooner. Performed at [facility], [place] [address], [place], [place] [address]      Liver Function Tests: Recent Labs  Lab 11/27/18 2019  AST 17  ALT 14  ALKPHOS 125  BILITOT 0.7  PROT 6.5  ALBUMIN 2.7*   Recent Labs  Lab 11/27/18 2019  LIPASE 11   No results for input(s): AMMONIA in the last 168 hours.  Cardiac Enzymes: No results for input(s): CKTOTAL, CKMB, CKMBINDEX, TROPONINI in the last 168 hours. BNP (last 3 results) Recent Labs    04/21/18 2209  BNP 254.9*    ProBNP (last 3 results) No results for input(s): PROBNP in the last 8760 hours.    Studies: No results found.   Admission status: Inpatient: Based on patients clinical presentation and evaluation of above clinical data, I have made determination that patient meets Inpatient criteria at this time.   [facility] Pager [phone]. If 7PM-7AM, please contact night-coverage at [email or web address], Office  [phone]  password [email or web address]  12/02/2018, 10:28 AM  LOS: 4 days

## 2018-12-02 NOTE — Progress Notes (Signed)
I have spoken with Lab staff.  Lab staff informs me that she is not able to receive lab work from patient.  She informs me that she will have day shift staff try and stick patient again.

## 2018-12-03 LAB — CBC WITH DIFFERENTIAL/PLATELET
Abs Immature Granulocytes: 0.08 10*3/uL — ABNORMAL HIGH (ref 0.00–0.07)
Basophils Absolute: 0 10*3/uL (ref 0.0–0.1)
Basophils Relative: 0 %
Eosinophils Absolute: 0.1 10*3/uL (ref 0.0–0.5)
Eosinophils Relative: 1 %
HCT: 21.1 % — ABNORMAL LOW (ref 39.0–52.0)
Hemoglobin: 6.9 g/dL — CL (ref 13.0–17.0)
Immature Granulocytes: 1 %
Lymphocytes Relative: 17 %
Lymphs Abs: 1.6 10*3/uL (ref 0.7–4.0)
MCH: 31.4 pg (ref 26.0–34.0)
MCHC: 32.7 g/dL (ref 30.0–36.0)
MCV: 95.9 fL (ref 80.0–100.0)
Monocytes Absolute: 0.7 10*3/uL (ref 0.1–1.0)
Monocytes Relative: 7 %
Neutro Abs: 6.9 10*3/uL (ref 1.7–7.7)
Neutrophils Relative %: 74 %
Platelets: 177 10*3/uL (ref 150–400)
RBC: 2.2 MIL/uL — ABNORMAL LOW (ref 4.22–5.81)
RDW: 14.2 % (ref 11.5–15.5)
WBC: 9.4 10*3/uL (ref 4.0–10.5)
nRBC: 0 % (ref 0.0–0.2)

## 2018-12-03 LAB — RENAL FUNCTION PANEL
Albumin: 2 g/dL — ABNORMAL LOW (ref 3.5–5.0)
Anion gap: 15 (ref 5–15)
BUN: 48 mg/dL — ABNORMAL HIGH (ref 6–20)
CO2: 20 mmol/L — ABNORMAL LOW (ref 22–32)
Calcium: 7.8 mg/dL — ABNORMAL LOW (ref 8.9–10.3)
Chloride: 97 mmol/L — ABNORMAL LOW (ref 98–111)
Creatinine, Ser: 8.62 mg/dL — ABNORMAL HIGH (ref 0.61–1.24)
GFR calc Af Amer: 8 mL/min — ABNORMAL LOW (ref >60)
GFR calc non Af Amer: 7 mL/min — ABNORMAL LOW (ref >60)
Glucose, Bld: 314 mg/dL — ABNORMAL HIGH (ref 70–99)
Phosphorus: 4.5 mg/dL (ref 2.5–4.6)
Potassium: 3.4 mmol/L — ABNORMAL LOW (ref 3.5–5.1)
Sodium: 132 mmol/L — ABNORMAL LOW (ref 135–145)

## 2018-12-03 LAB — CULTURE, BLOOD (ROUTINE X 2): Culture: NO GROWTH

## 2018-12-03 LAB — GLUCOSE, CAPILLARY
Glucose-Capillary: 200 mg/dL — ABNORMAL HIGH (ref 70–99)
Glucose-Capillary: 313 mg/dL — ABNORMAL HIGH (ref 70–99)
Glucose-Capillary: 359 mg/dL — ABNORMAL HIGH (ref 70–99)

## 2018-12-03 LAB — PREPARE RBC (CROSSMATCH)

## 2018-12-03 MED ORDER — SODIUM CHLORIDE 0.9% IV SOLUTION: Freq: Once | INTRAVENOUS | Status: DC

## 2018-12-03 MED ORDER — SODIUM CHLORIDE 0.9 % IV SOLN: 100.0000 mL | INTRAVENOUS | Status: DC | PRN

## 2018-12-03 MED ORDER — HEPARIN SODIUM (PORCINE) 1000 UNIT/ML IJ SOLN
INTRAMUSCULAR | Status: AC
  Administered 2018-12-03: 12:00:00 1000 [IU]
  Filled 2018-12-03: qty 4

## 2018-12-03 MED ORDER — HEPARIN SODIUM (PORCINE) 1000 UNIT/ML DIALYSIS: 1000.0000 [IU] | INTRAMUSCULAR | Status: DC | PRN

## 2018-12-03 MED ORDER — ONDANSETRON HCL 4 MG/2ML IJ SOLN
INTRAMUSCULAR | Status: AC
  Administered 2018-12-03: 11:00:00 4 mg via INTRAVENOUS
  Filled 2018-12-03: qty 2

## 2018-12-03 MED ORDER — LIDOCAINE-PRILOCAINE 2.5-2.5 % EX CREA: 1.0000 "application " | TOPICAL_CREAM | CUTANEOUS | Status: DC | PRN

## 2018-12-03 MED ORDER — PENTAFLUOROPROP-TETRAFLUOROETH EX AERO: 1.0000 "application " | INHALATION_SPRAY | CUTANEOUS | Status: DC | PRN

## 2018-12-03 MED ORDER — SODIUM CHLORIDE 0.9 % IV SOLN
500.0000 mg | Freq: Every day | INTRAVENOUS | Status: AC
  Administered 2018-12-03 – 2018-12-04 (×2): 500 mg via INTRAVENOUS
  Filled 2018-12-03 (×2): qty 0.5

## 2018-12-03 MED ORDER — ALTEPLASE 2 MG IJ SOLR: 2.0000 mg | Freq: Once | INTRAMUSCULAR | Status: DC | PRN

## 2018-12-03 MED ORDER — LIDOCAINE HCL (PF) 1 % IJ SOLN: 5.0000 mL | INTRAMUSCULAR | Status: DC | PRN

## 2018-12-03 NOTE — Progress Notes (Signed)
MD called regarding low hemoglobin, no new order at this time. MD will notify nephrology .

## 2018-12-03 NOTE — Progress Notes (Signed)
Inpatient Diabetes Program Recommendations  AACE/ADA: New Consensus Statement on Inpatient Glycemic Control (2015)  Target Ranges:  Prepandial:   less than 140 mg/dL      Peak postprandial:   less than 180 mg/dL (1-2 hours)      Critically ill patients:  140 - 180 mg/dL   Lab Results  Component Value Date   GLUCAP 200 (H) 12/03/2018   HGBA1C 7.2 (H) 06/19/2018    Review of Glycemic Control Results for BERGEN, MAGNER (MRN [id number]) as of 12/03/2018 14:48  Ref. Range 12/02/2018 07:54 12/02/2018 12:21 12/02/2018 17:16 12/02/2018 20:54 12/03/2018 12:08  Glucose-Capillary Latest Ref Range: 70 - 99 mg/dL 320 (H) 313 (H) 124 (H) 208 (H) 200 (H)   Diabetes history: DM 2 Outpatient Diabetes medications: Basaglar 24 units, Novolog 0-15 units tid, Tradjenta 5 mg Daily Current orders for Inpatient glycemic control:  Lantus 28 units Novolog 0-15 units tid + hs  Novolog 4 units tid meal coverage  Inpatient Diabetes Program Recommendations:    Glucose trends elevated. Consider increasing Lantus to 30-32 units.  Thanks,  Tama Headings RN, MSN, BC-ADM Inpatient Diabetes Coordinator Team Pager [phone] (8a-5p)

## 2018-12-03 NOTE — Progress Notes (Signed)
[facility] Progress Note   Subjective:   Seen on HD, no new c/o  Objective Vitals:   12/03/18 0909 12/03/18 0939 12/03/18 1009 12/03/18 1039  BP: (!) 117/56 (!) 101/55 107/68 123/71  Pulse: 85 89 62 92  Resp: 18 20 20 18   Temp:      TempSrc:      SpO2:      Weight:      Height:       Physical Exam General: WNWD male NAD  Heart: RRR,  Lungs: CTA bilaterally  Abdomen: Soft, non-tender, non-distended.  Extremities: No LE edema  Dialysis Access: RIJ TDC, RUE AVF + bruit  Additional Objective Labs: Basic Metabolic Panel: Recent Labs  Lab 11/30/18 0500 12/01/18 0950 12/02/18 0708  NA 132* 134* 133*  K 3.2* 2.7* 3.5  3.5  CL 94* 98 96*  CO2 22 21* 20*  GLUCOSE 385* 259* 362*  BUN 51* 28* 39*  CREATININE 7.61* 5.56* 7.09*  CALCIUM 7.6* 7.9* 8.0*   Liver Function Tests: Recent Labs  Lab 11/27/18 2019  AST 17  ALT 14  ALKPHOS 125  BILITOT 0.7  PROT 6.5  ALBUMIN 2.7*   Recent Labs  Lab 11/27/18 2019  LIPASE 11   CBC: Recent Labs  Lab 11/27/18 2019 11/29/18 0120 11/30/18 0500  WBC 8.2 13.2* 10.9*  HGB 8.8* 8.2* 7.3*  HCT 26.1* 24.3* 21.7*  MCV 93.5 93.1 93.5  PLT 141* 155 126*   Blood Culture    Component Value Date/Time   SDES BLOOD RIGHT HAND 11/28/2018 0526   SPECREQUEST  11/28/2018 0526    BOTTLES DRAWN AEROBIC AND ANAEROBIC Blood Culture results may not be optimal due to an inadequate volume of blood received in culture bottles   CULT  11/28/2018 0526    NO GROWTH 5 DAYS Performed at [facility], [place] [address], [place], [place] [address]    REPTSTATUS 12/03/2018 FINAL 11/28/2018 0526    CBG: Recent Labs  Lab 12/01/18 2118 12/02/18 0754 12/02/18 1221 12/02/18 1716 12/02/18 2054  GLUCAP 235* 320* 313* 124* 208*    Studies/Results: No results found. Medications: . sodium chloride    . sodium chloride    . meropenem (MERREM) IV 500 mg (12/03/18 0602)   . allopurinol  100 mg Oral Daily  . amLODipine   5 mg Oral QHS  . calcium acetate  2,001 mg Oral TID WC  . carbamazepine  800 mg Oral BID  . Chlorhexidine Gluconate Cloth  6 each Topical Q0600  . cholecalciferol  2,000 Units Oral Daily  . famotidine  20 mg Oral Daily  . feeding supplement (NEPRO CARB STEADY)  237 mL Oral BID BM  . feeding supplement (PRO-STAT SUGAR FREE 64)  30 mL Oral BID  . ferrous sulfate  325 mg Oral Q breakfast  . heparin  5,000 Units Subcutaneous Q8H  . insulin aspart  0-15 Units Subcutaneous TID WC  . insulin aspart  0-5 Units Subcutaneous QHS  . insulin aspart  4 Units Subcutaneous TID WC  . insulin glargine  28 Units Subcutaneous Daily  . lactulose  30 g Oral BID  . multivitamin with minerals  1 tablet Oral QHS  . ondansetron      . PHENobarbital  259.2 mg Oral QHS  . simvastatin  20 mg Oral QHS  . tamsulosin  0.4 mg Oral QHS    Dialysis: [facility] MWF  4h 27mn   400/800   105.5kg   3K/2.25Ca  TDC  Hep 3000 Mircera 11mg IV q 2 weeks- last dose 11/21/2018 Binder: calcium acetate 4 caps TID AC and 2 with snacks  Assessment/Plan: 1. Acute pyelonephritis: CT concerning for left pyelonephritis and with nonobstructive right renal pelvic stone. Hx of mult prior admissions for UTI/ pyelo.  Urine cx here + >100,000 col Klebsiella. On Meropenem per primary.  2. ESRD:  HD MWF. HD today.  3. Hypokalemia. Supplementing 4. Hypertension/volume:  close to dry wt or under, exam wnl  5. Anemia: Hemoglobin 8.2 >7.3. Transfuse prn. Next ESA due 11/11.  Holding IV Fe in the setting of sepsis. 6. Metabolic bone disease: Calcium 8.8 corrected.  Continue calcium acetate binder.  7. Seizures: on Tegretol, phenobarbital  8. T2DM: On insulin, per primary.    RKelly Splinter MD 12/03/2018, 11:42 AM

## 2018-12-03 NOTE — Progress Notes (Signed)
PROGRESS NOTE  Eddie Davies TKZ:[id number] DOB: [birth year] DOA: 11/27/2018 PCP: [facility]  HPI/Recap of past 73 hours:  48 year old male with history of diabetes type 2, liver cirrhosis end-stage renal disease on hemodialysis on Monday Wednesday and Friday, hypertension, history of ES BL/Klebsiella urinary tract infection who was admitted with abdominal and flank pain of 4 days duration his urine microscopy in the emergency room showed WBC of 50 with many bacteria CT scan was scan of the abdomen and pelvis showed concerning for left renal pyelonephritis with a 13 mm right renal pelvic stone nonobstructing.  He has a history of UTI bacteremia with ESBL in May 2020.  He has hiccup and is being treated with Thorazine  Subjective: Patient seen and examined at bedside.  Complaining that he still has constant hiccups.  Patient is getting Thorazine  Assessment/Plan: Principal Problem:   Pyelonephritis Active Problems:   End stage liver disease (HCC)   BPH (benign prostatic hyperplasia)   Benign essential HTN   Diabetes mellitus type 2, uncontrolled, with complications (HCC)  #1 acute pyelonephritis seen on CT scan of the abdomen and pelvis with evidence concerning for left renal pelvic pyelonephritis and history of Klebsiella UTI.  He was started on meropenem IV he will continue with that and will monitor his CBC  2.  End-stage renal disease on hemodialysis on Monday Wednesday and Friday.  He had hemodialysis today  3.  Anemia of chronic disease with acute worsening.  His admitting hemoglobin was 8.2 today is 6.9 this might be just dilutional but both consulted with nephrology he agreed with giving him 1 unit of packed RBC we will recheck in the morning  4.  Type 2 diabetes mellitus on insulin continue insulin therapy  Hypertension uncontrolled continue amlodipine and clonidine  6.  History of seizure disorder continue phenobarb and Tegretol  7.  Hiccups.   We will continue Thorazine 3 times daily we will continue Pepcid   Code Status: Full  Severity of Illness: The appropriate patient status for this patient is INPATIENT. Inpatient status is judged to be reasonable and necessary in order to provide the required intensity of service to ensure the patient's safety. The patient's presenting symptoms, physical exam findings, and initial radiographic and laboratory data in the context of their chronic comorbidities is felt to place them at high risk for further clinical deterioration. Furthermore, it is not anticipated that the patient will be medically stable for discharge from the hospital within 2 midnights of admission. The following factors support the patient status of inpatient.   " The patient's presenting symptoms include urinary tract infection. " The worrisome physical exam findings include urinary tract infection with possible pyelonephritis. " The initial radiographic and laboratory data are worrisome because of pyelonephritis/sepsis. " The chronic co-morbidities include end-stage renal disease on hemodialysis history of ESBL/Klebsiella UTI.   * I certify that at the point of admission it is my clinical judgment that the patient will require inpatient hospital care spanning beyond 2 midnights from the point of admission due to high intensity of service, high risk for further deterioration and high frequency of surveillance required.*    Family Communication: Discussed with patient  Disposition Plan: Back to SNF when stable   Consultants:  Nephrology  Procedures:  Hemodialysis  Antimicrobials:  Meropenem  DVT prophylaxis: Heparin   Objective: Vitals:   12/02/18 1517 12/02/18 1959 12/03/18 0605 12/03/18 0700  BP: 129/76 116/78 121/67 130/71  Pulse: 83 97 92 83  Resp: 19 18 15 16   Temp: 98.4 F (36.9 C) 98.3 F (36.8 C) 98.5 F (36.9 C) 97.8 F (36.6 C)  TempSrc: Axillary Axillary Oral Axillary  SpO2: 100% 97% 93%  99%  Weight:    104.2 kg  Height:        Intake/Output Summary (Last 24 hours) at 12/03/2018 0830 Last data filed at 12/03/2018 0602 Gross per 24 hour  Intake 520 ml  Output 50 ml  Net 470 ml   Filed Weights   11/30/18 0701 11/30/18 1125 12/03/18 0700  Weight: 104.4 kg 102.3 kg 104.2 kg   Body mass index is 29.49 kg/m.  Exam:  . General: 48 y.o. year-old male well developed well nourished in no acute distress.  Alert and oriented x3. . Cardiovascular: Regular rate and rhythm with no rubs or gallops.  No thyromegaly or JVD noted.   Marland Kitchen Respiratory: Clear to auscultation with no wheezes or rales. Good inspiratory effort. . Abdomen: Soft nontender nondistended with normal bowel sounds x4 quadrants. . Musculoskeletal: No lower extremity edema. 2/4 pulses in all 4 extremities. . Skin: No ulcerative lesions noted or rashes, . Psychiatry: Mood is appropriate for condition and setting    Data Reviewed: CBC: Recent Labs  Lab 11/27/18 2019 11/29/18 0120 11/30/18 0500  WBC 8.2 13.2* 10.9*  HGB 8.8* 8.2* 7.3*  HCT 26.1* 24.3* 21.7*  MCV 93.5 93.1 93.5  PLT 141* 155 438*   Basic Metabolic Panel: Recent Labs  Lab 11/28/18 0956 11/29/18 0120 11/30/18 0500 12/01/18 0950 12/02/18 0708  NA 138 133* 132* 134* 133*  K 4.2 3.0* 3.2* 2.7* 3.5  3.5  CL 100 93* 94* 98 96*  CO2 16* 24 22 21* 20*  GLUCOSE 524* 305* 385* 259* 362*  BUN 77* 29* 51* 28* 39*  CREATININE 8.56* 4.92* 7.61* 5.56* 7.09*  CALCIUM 7.7* 7.8* 7.6* 7.9* 8.0*  MG 2.2  --   --   --   --    GFR: Estimated Creatinine Clearance: 16.4 mL/min (A) (by C-G formula based on SCr of 7.09 mg/dL (H)). Liver Function Tests: Recent Labs  Lab 11/27/18 2019  AST 17  ALT 14  ALKPHOS 125  BILITOT 0.7  PROT 6.5  ALBUMIN 2.7*   Recent Labs  Lab 11/27/18 2019  LIPASE 11   No results for input(s): AMMONIA in the last 168 hours. Coagulation Profile: No results for input(s): INR, PROTIME in the last 168 hours. Cardiac  Enzymes: No results for input(s): CKTOTAL, CKMB, CKMBINDEX, TROPONINI in the last 168 hours. BNP (last 3 results) No results for input(s): PROBNP in the last 8760 hours. HbA1C: No results for input(s): HGBA1C in the last 72 hours. CBG: Recent Labs  Lab 12/01/18 2118 12/02/18 0754 12/02/18 1221 12/02/18 1716 12/02/18 2054  GLUCAP 235* 320* 313* 124* 208*   Lipid Profile: No results for input(s): CHOL, HDL, LDLCALC, TRIG, CHOLHDL, LDLDIRECT in the last 72 hours. Thyroid Function Tests: No results for input(s): TSH, T4TOTAL, FREET4, T3FREE, THYROIDAB in the last 72 hours. Anemia Panel: No results for input(s): VITAMINB12, FOLATE, FERRITIN, TIBC, IRON, RETICCTPCT in the last 72 hours. Urine analysis:    Component Value Date/Time   COLORURINE RED (A) 11/28/2018 0431   APPEARANCEUR TURBID (A) 11/28/2018 0431   LABSPEC  11/28/2018 0431    TEST NOT REPORTED DUE TO COLOR INTERFERENCE OF URINE PIGMENT   PHURINE  11/28/2018 0431    TEST NOT REPORTED DUE TO COLOR INTERFERENCE OF URINE PIGMENT   GLUCOSEU (A) 11/28/2018  0431    TEST NOT REPORTED DUE TO COLOR INTERFERENCE OF URINE PIGMENT   HGBUR (A) 11/28/2018 0431    TEST NOT REPORTED DUE TO COLOR INTERFERENCE OF URINE PIGMENT   BILIRUBINUR (A) 11/28/2018 0431    TEST NOT REPORTED DUE TO COLOR INTERFERENCE OF URINE PIGMENT   KETONESUR (A) 11/28/2018 0431    TEST NOT REPORTED DUE TO COLOR INTERFERENCE OF URINE PIGMENT   PROTEINUR (A) 11/28/2018 0431    TEST NOT REPORTED DUE TO COLOR INTERFERENCE OF URINE PIGMENT   NITRITE (A) 11/28/2018 0431    TEST NOT REPORTED DUE TO COLOR INTERFERENCE OF URINE PIGMENT   LEUKOCYTESUR (A) 11/28/2018 0431    TEST NOT REPORTED DUE TO COLOR INTERFERENCE OF URINE PIGMENT   Sepsis Labs: @LABRCNTIP (procalcitonin:4,lacticidven:4)  ) Recent Results (from the past 240 hour(s))  Urine culture     Status: Abnormal   Collection Time: 11/28/18  4:11 AM   Specimen: Urine, Random  Result Value Ref Range  Status   Specimen Description URINE, RANDOM  Final   Special Requests   Final    NONE Performed at [facility], [address], [place], [place] [address]    Culture (A)  Final    >=100,000 COLONIES/mL KLEBSIELLA PNEUMONIAE Confirmed Extended Spectrum Beta-Lactamase Producer (ESBL).  In bloodstream infections from ESBL organisms, carbapenems are preferred over piperacillin/tazobactam. They are shown to have a lower risk of mortality.    Report Status 12/01/2018 FINAL  Final   Organism ID, Bacteria KLEBSIELLA PNEUMONIAE (A)  Final      Susceptibility   Klebsiella pneumoniae - MIC*    AMPICILLIN >=32 RESISTANT Resistant     CEFAZOLIN RESISTANT Resistant     CEFTRIAXONE RESISTANT Resistant     CIPROFLOXACIN 0.5 SENSITIVE Sensitive     GENTAMICIN 8 INTERMEDIATE Intermediate     IMIPENEM <=0.25 SENSITIVE Sensitive     NITROFURANTOIN 64 INTERMEDIATE Intermediate     TRIMETH/SULFA >=320 RESISTANT Resistant     AMPICILLIN/SULBACTAM 8 SENSITIVE Sensitive     PIP/TAZO <=4 SENSITIVE Sensitive     Extended ESBL POSITIVE Resistant     * >=100,000 COLONIES/mL KLEBSIELLA PNEUMONIAE  Blood culture (routine x 2)     Status: Abnormal   Collection Time: 11/28/18  4:15 AM   Specimen: BLOOD LEFT HAND  Result Value Ref Range Status   Specimen Description BLOOD LEFT HAND  Final   Special Requests   Final    BOTTLES DRAWN AEROBIC AND ANAEROBIC Blood Culture results may not be optimal due to an inadequate volume of blood received in culture bottles   Culture  Setup Time   Final    GRAM POSITIVE COCCI IN CLUSTERS IN BOTH AEROBIC AND ANAEROBIC BOTTLES CRITICAL RESULT CALLED TO, READ BACK BY AND VERIFIED WITH: G. ABBOTT,PHARMD 0425 11/29/2018 T. TYSOR    Culture (A)  Final    STAPHYLOCOCCUS SPECIES (COAGULASE NEGATIVE) THE SIGNIFICANCE OF ISOLATING THIS ORGANISM FROM A SINGLE SET OF BLOOD CULTURES WHEN MULTIPLE SETS ARE DRAWN IS UNCERTAIN. PLEASE NOTIFY THE MICROBIOLOGY DEPARTMENT WITHIN ONE WEEK  IF SPECIATION AND SENSITIVITIES ARE REQUIRED. Performed at [facility], [place] [address], [place], [place] [address]    Report Status 11/30/2018 FINAL  Final  Blood culture (routine x 2)     Status: None   Collection Time: 11/28/18  5:26 AM   Specimen: BLOOD RIGHT HAND  Result Value Ref Range Status   Specimen Description BLOOD RIGHT HAND  Final   Special Requests  Final    BOTTLES DRAWN AEROBIC AND ANAEROBIC Blood Culture results may not be optimal due to an inadequate volume of blood received in culture bottles   Culture   Final    NO GROWTH 5 DAYS Performed at [facility], [place] [address] [place], [place] [address]    Report Status 12/03/2018 FINAL  Final  SARS CORONAVIRUS 2 (TAT 6-24 HRS) Nasopharyngeal Nasopharyngeal Swab     Status: None   Collection Time: 11/28/18  6:48 AM   Specimen: Nasopharyngeal Swab  Result Value Ref Range Status   SARS Coronavirus 2 NEGATIVE NEGATIVE Final    Comment: (NOTE) SARS-CoV-2 target nucleic acids are NOT DETECTED. The SARS-CoV-2 RNA is generally detectable in upper and lower respiratory specimens during the acute phase of infection. Negative results do not preclude SARS-CoV-2 infection, do not rule out co-infections with other pathogens, and should not be used as the sole basis for treatment or other patient management decisions. Negative results must be combined with clinical observations, patient history, and epidemiological information. The expected result is Negative. Fact Sheet for Patients: SugarRoll.be Fact Sheet for Healthcare Providers: https://www.woods-mathews.com/ This test is not yet approved or cleared by the [place] FDA and  has been authorized for detection and/or diagnosis of SARS-CoV-2 by FDA under an Emergency Use Authorization (EUA). This EUA will remain  in effect (meaning this test can be used) for the duration of the COVID-19 declaration under Section 56  4(b)(1) of the Act, 21 U.S.C. section 360bbb-3(b)(1), unless the authorization is terminated or revoked sooner. Performed at [facility], [place] [address] [place], [place] [address]   SARS CORONAVIRUS 2 (TAT 6-24 HRS) Nasopharyngeal Nasopharyngeal Swab     Status: None   Collection Time: 12/01/18  9:26 AM   Specimen: Nasopharyngeal Swab  Result Value Ref Range Status   SARS Coronavirus 2 NEGATIVE NEGATIVE Final    Comment: (NOTE) SARS-CoV-2 target nucleic acids are NOT DETECTED. The SARS-CoV-2 RNA is generally detectable in upper and lower respiratory specimens during the acute phase of infection. Negative results do not preclude SARS-CoV-2 infection, do not rule out co-infections with other pathogens, and should not be used as the sole basis for treatment or other patient management decisions. Negative results must be combined with clinical observations, patient history, and epidemiological information. The expected result is Negative. Fact Sheet for Patients: SugarRoll.be Fact Sheet for Healthcare Providers: https://www.woods-mathews.com/ This test is not yet approved or cleared by the [place] FDA and  has been authorized for detection and/or diagnosis of SARS-CoV-2 by FDA under an Emergency Use Authorization (EUA). This EUA will remain  in effect (meaning this test can be used) for the duration of the COVID-19 declaration under Section 56 4(b)(1) of the Act, 21 U.S.C. section 360bbb-3(b)(1), unless the authorization is terminated or revoked sooner. Performed at [facility], [place] [address], [place], [place] [address]       Studies: No results found.  Scheduled Meds: . allopurinol  100 mg Oral Daily  . amLODipine  5 mg Oral QHS  . calcium acetate  2,001 mg Oral TID WC  . carbamazepine  800 mg Oral BID  . Chlorhexidine Gluconate Cloth  6 each Topical Q0600  . cholecalciferol  2,000 Units Oral Daily  .  famotidine  20 mg Oral Daily  . feeding supplement (NEPRO CARB STEADY)  237 mL Oral BID BM  . feeding supplement (PRO-STAT SUGAR FREE 64)  30 mL Oral BID  . ferrous  sulfate  325 mg Oral Q breakfast  . heparin  5,000 Units Subcutaneous Q8H  . insulin aspart  0-15 Units Subcutaneous TID WC  . insulin aspart  0-5 Units Subcutaneous QHS  . insulin aspart  4 Units Subcutaneous TID WC  . insulin glargine  28 Units Subcutaneous Daily  . lactulose  30 g Oral BID  . multivitamin with minerals  1 tablet Oral QHS  . PHENobarbital  259.2 mg Oral QHS  . simvastatin  20 mg Oral QHS  . tamsulosin  0.4 mg Oral QHS    Continuous Infusions: . sodium chloride    . sodium chloride    . meropenem (MERREM) IV 500 mg (12/03/18 0602)     LOS: 5 days     Cristal Deer, MD [facility]  To reach me or the doctor on call, go to: [email or web address] Password [email or web address]  12/03/2018, 8:30 AM

## 2018-12-03 NOTE — TOC Progression Note (Signed)
Transition of Care [facility]) - Progression Note    Patient Details  Name: [name] MRN: [id number] Date of Birth: 1970-10-20  Transition of Care [facility]) CM/SW [place], [place] Phone Number: 12/03/2018, 12:12 PM  Clinical Narrative:    Messaged MD regarding discharge timing. Pt from SNF, last neg COVID resulted 11/8, will be valid if pt discharges today or tomorrow.  Await response.    Expected Discharge Plan: [place] Barriers to Discharge: Continued Medical Work up  Expected Discharge Plan and Services Expected Discharge Plan: [place] In-house Referral: Clinical Social Work Discharge Planning Services: CM Consult Post Acute Care Choice: Resumption of Svcs/PTA Provider, [place] Living arrangements for the past 2 months: [place]   Social Determinants of Health (SDOH) Interventions    Readmission Risk Interventions Readmission Risk Prevention Plan 11/29/2018 05/28/2018 05/23/2018  Transportation Screening Complete - Complete  PCP or Specialist Appt within 5-7 Days - - -  Home Care Screening - - -  Medication Review (RN CM) - - -  Medication Review (RN Transport planner) Complete - Complete  PCP or Specialist appointment within 3-5 days of discharge Not Complete - -  PCP/Specialist Appt Not Complete comments pt is SNF resident - -  [place] or [place] Not Complete Complete (No Data)  [place] or Home Care Consult Pt Refusal Comments pt is SNF resident - -  SW Recovery Care/Counseling Consult Complete Complete -  Palliative Care Screening Not Applicable - Not Applicable  Skilled Nursing Facility Complete - Complete  Some recent data might be hidden

## 2018-12-04 LAB — TYPE AND SCREEN
ABO/RH(D): A POS
Antibody Screen: NEGATIVE
Unit division: 0

## 2018-12-04 LAB — BPAM RBC
Blood Product Expiration Date: 202012072359
ISSUE DATE / TIME: 202011092156
Unit Type and Rh: 6200

## 2018-12-04 LAB — RENAL FUNCTION PANEL
Albumin: 2 g/dL — ABNORMAL LOW (ref 3.5–5.0)
Anion gap: 14 (ref 5–15)
BUN: 31 mg/dL — ABNORMAL HIGH (ref 6–20)
CO2: 22 mmol/L (ref 22–32)
Calcium: 7.9 mg/dL — ABNORMAL LOW (ref 8.9–10.3)
Chloride: 101 mmol/L (ref 98–111)
Creatinine, Ser: 6.27 mg/dL — ABNORMAL HIGH (ref 0.61–1.24)
GFR calc Af Amer: 11 mL/min — ABNORMAL LOW (ref >60)
GFR calc non Af Amer: 10 mL/min — ABNORMAL LOW (ref >60)
Glucose, Bld: 302 mg/dL — ABNORMAL HIGH (ref 70–99)
Phosphorus: 4.1 mg/dL (ref 2.5–4.6)
Potassium: 4.3 mmol/L (ref 3.5–5.1)
Sodium: 137 mmol/L (ref 135–145)

## 2018-12-04 LAB — SARS CORONAVIRUS 2 (TAT 6-24 HRS): SARS Coronavirus 2: NEGATIVE

## 2018-12-04 LAB — GLUCOSE, CAPILLARY
Glucose-Capillary: 224 mg/dL — ABNORMAL HIGH (ref 70–99)
Glucose-Capillary: 235 mg/dL — ABNORMAL HIGH (ref 70–99)
Glucose-Capillary: 245 mg/dL — ABNORMAL HIGH (ref 70–99)
Glucose-Capillary: 261 mg/dL — ABNORMAL HIGH (ref 70–99)

## 2018-12-04 LAB — HEMOGLOBIN AND HEMATOCRIT, BLOOD
HCT: 26.5 % — ABNORMAL LOW (ref 39.0–52.0)
Hemoglobin: 8.6 g/dL — ABNORMAL LOW (ref 13.0–17.0)

## 2018-12-04 MED ORDER — DARBEPOETIN ALFA 100 MCG/0.5ML IJ SOSY: 100.0000 ug | PREFILLED_SYRINGE | INTRAMUSCULAR | Status: DC

## 2018-12-04 MED ORDER — CHLORHEXIDINE GLUCONATE CLOTH 2 % EX PADS: 6.0000 | MEDICATED_PAD | Freq: Every day | CUTANEOUS | Status: DC

## 2018-12-04 MED ORDER — DARBEPOETIN ALFA 100 MCG/0.5ML IJ SOSY
100.0000 ug | PREFILLED_SYRINGE | INTRAMUSCULAR | Status: DC
  Filled 2018-12-04: qty 0.5

## 2018-12-04 NOTE — TOC Transition Note (Addendum)
Transition of Care [facility]) - CM/SW Discharge Note   Patient Details  Name: Ariston Grandison MRN: [id number] Date of Birth: 10-30-1970  Transition of Care [facility]) CM/SW Contact:  [place], [place] Phone Number: 12/04/2018, 11:18 AM   Clinical Narrative:    12:06am- CSW spoke with Lorrie at [facility], even though [place] resulted 11/8 it was swabbed 11/7 therefore pt will need new COVID.   11:18am- Aware pt stable for dc per MD.  CSW f/u with Karena Addison at [facility] and [place], admissions liaison. Last neg COVID resulted on 11/8.    Final next level of care: Skilled Nursing Facility Barriers to Discharge: Continued Medical Work up   Patient Goals and CMS Choice   CMS Medicare.gov Compare Post Acute Care list provided to:: Patient Choice offered to / list presented to : Patient  Discharge Placement   Existing PASRR number confirmed : 11/29/18          Patient chooses bed at: [facility] and Rehab Patient to be transferred to facility by: [place] Name of family member notified: pt declines Patient and family notified of of transfer: 12/04/18  Discharge Plan and Services In-house Referral: Clinical Social Work Discharge Planning Services: CM Consult Post Acute Care Choice: Resumption of Product/process development scientist, [place]           Social Determinants of Health (SDOH) Interventions     Readmission Risk Interventions Readmission Risk Prevention Plan 11/29/2018 05/28/2018 05/23/2018  Transportation Screening Complete - Complete  PCP or Specialist Appt within 5-7 Days - - -  Home Care Screening - - -  Medication Review (RN CM) - - -  Medication Review (RN Transport planner) Complete - Complete  PCP or Specialist appointment within 3-5 days of discharge Not Complete - -  PCP/Specialist Appt Not Complete comments pt is SNF resident - -  [place] or [place] Not Complete Complete (No Data)  [place] or Home Care Consult Pt Refusal Comments pt is  SNF resident - -  SW Recovery Care/Counseling Consult Complete Complete -  Palliative Care Screening Not Applicable - Not Applicable  Skilled Nursing Facility Complete - Complete  Some recent data might be hidden

## 2018-12-04 NOTE — Progress Notes (Signed)
[facility] Progress Note   Subjective:   Patient seen in room. Still reporting hiccups, nausea and abdominal pain. Tmax 100.1 overnight. Denies SOB, orthopnea, cough, CP, palpitations, diarrhea. Reports no issues with HD.   Objective Vitals:   12/03/18 2040 12/03/18 2146 12/03/18 2227 12/04/18 0625  BP: 126/70 132/77 136/70 122/73  Pulse: 87 87 82 79  Resp:  18 18 18   Temp: 100.1 F (37.8 C) 99.9 F (37.7 C) 98.5 F (36.9 C) 99.1 F (37.3 C)  TempSrc: Oral Oral Oral Oral  SpO2: 100% 100% 99% 98%  Weight:      Height:       Physical Exam General: Well developed, well nourished male, alert and in NAD Heart:  RRR, no murmurs, rubs or gallops Lungs: CTA bilaterally without wheezing,rhonchi or rales Abdomen: Soft, non-tender, +BS Extremities: No edema b/l lower extremities Dialysis Access: RIJ TDC, RUE AVF + bruit  Additional Objective Labs: Basic Metabolic Panel: Recent Labs  Lab 12/01/18 0950 12/02/18 0708 12/03/18 0947  NA 134* 133* 132*  K 2.7* 3.5  3.5 3.4*  CL 98 96* 97*  CO2 21* 20* 20*  GLUCOSE 259* 362* 314*  BUN 28* 39* 48*  CREATININE 5.56* 7.09* 8.62*  CALCIUM 7.9* 8.0* 7.8*  PHOS  --   --  4.5   Liver Function Tests: Recent Labs  Lab 11/27/18 2019 12/03/18 0947  AST 17  --   ALT 14  --   ALKPHOS 125  --   BILITOT 0.7  --   PROT 6.5  --   ALBUMIN 2.7* 2.0*   Recent Labs  Lab 11/27/18 2019  LIPASE 11   CBC: Recent Labs  Lab 11/27/18 2019 11/29/18 0120 11/30/18 0500 12/03/18 1232 12/04/18 0654  WBC 8.2 13.2* 10.9* 9.4  --   NEUTROABS  --   --   --  6.9  --   HGB 8.8* 8.2* 7.3* 6.9* 8.6*  HCT 26.1* 24.3* 21.7* 21.1* 26.5*  MCV 93.5 93.1 93.5 95.9  --   PLT 141* 155 126* 177  --    Blood Culture    Component Value Date/Time   SDES BLOOD RIGHT HAND 11/28/2018 0526   SPECREQUEST  11/28/2018 0526    BOTTLES DRAWN AEROBIC AND ANAEROBIC Blood Culture results may not be optimal due to an inadequate volume of blood  received in culture bottles   CULT  11/28/2018 0526    NO GROWTH 5 DAYS Performed at [facility], [address] [place], [place] [address]    REPTSTATUS 12/03/2018 FINAL 11/28/2018 0526   CBG: Recent Labs  Lab 12/03/18 1208 12/03/18 1702 12/03/18 2120 12/04/18 0812 12/04/18 1152  GLUCAP 200* 313* 359* 224* 235*   Medications: . sodium chloride    . sodium chloride     . sodium chloride   Intravenous Once  . allopurinol  100 mg Oral Daily  . amLODipine  5 mg Oral QHS  . calcium acetate  2,001 mg Oral TID WC  . carbamazepine  800 mg Oral BID  . Chlorhexidine Gluconate Cloth  6 each Topical Q0600  . cholecalciferol  2,000 Units Oral Daily  . famotidine  20 mg Oral Daily  . feeding supplement (NEPRO CARB STEADY)  237 mL Oral BID BM  . feeding supplement (PRO-STAT SUGAR FREE 64)  30 mL Oral BID  . ferrous sulfate  325 mg Oral Q breakfast  . heparin  5,000 Units Subcutaneous Q8H  . insulin aspart  0-15 Units Subcutaneous  TID WC  . insulin aspart  0-5 Units Subcutaneous QHS  . insulin aspart  4 Units Subcutaneous TID WC  . insulin glargine  28 Units Subcutaneous Daily  . lactulose  30 g Oral BID  . multivitamin with minerals  1 tablet Oral QHS  . PHENobarbital  259.2 mg Oral QHS  . simvastatin  20 mg Oral QHS  . tamsulosin  0.4 mg Oral QHS    Dialysis Orders: [facility] MWF  4h 61mn   400/800   105.5kg   3K/2.25Ca  TDC   Hep 3000 Mircera 1524m IV q 2 weeks- last dose 11/21/2018 Binder: calcium acetate 4 caps TID AC and 2 with snacks  Assessment/Plan: 1. Acute pyelonephritis: CT concerning for left pyelonephritis and with nonobstructive right renal pelvic stone. Hx of mult prior admissions for UTI/ pyelo.  Urine cx here + >100,000 col Klebsiella. On Meropenem per primary.  2. ESRD:HD MWF. Next HD 12/05/2018. 3. Hypokalemia: Has required PO supplementation. K+ 3.4 yesterday, will repeat RFP today. Supplement as needed.  4. Hypertension/volume: BP adequately  controlled. Now under EDW, decreased appetite. UF with HD tomorrow as tolerated. 5. Anemia:Hemoglobin 6.9 yesterday, transfused 1 unit PRBC and now 8.6 this AM. Transfuse prn. Next ESA due 11/11.  Holding IV Fe in the setting of sepsis. 6. Metabolic bone disease:Calcium 9.4 corrected.  Phos 4.5. Continue calcium acetate binder. 7. Seizures: on Tegretol, phenobarbital  8. T2DM: On insulin, per primary. 9. Hiccups: Since admission. On thorazine and Pepcid. Per primary.   SaAnice PaganiniPA-C 12/04/2018, 12:09 PM  [facility] Pager: [phone]

## 2018-12-04 NOTE — Progress Notes (Signed)
Inpatient Diabetes Program Recommendations  AACE/ADA: New Consensus Statement on Inpatient Glycemic Control (2015)  Target Ranges:  Prepandial:   less than 140 mg/dL      Peak postprandial:   less than 180 mg/dL (1-2 hours)      Critically ill patients:  140 - 180 mg/dL   Lab Results  Component Value Date   GLUCAP 224 (H) 12/04/2018   HGBA1C 7.2 (H) 06/19/2018    Review of Glycemic Control Results for Eddie Davies, Eddie Davies (MRN [id number]) as of 12/03/2018 14:48  Ref. Range 12/02/2018 07:54 12/02/2018 12:21 12/02/2018 17:16 12/02/2018 20:54 12/03/2018 12:08  Glucose-Capillary Latest Ref Range: 70 - 99 mg/dL 320 (H) 313 (H) 124 (H) 208 (H) 200 (H)   Diabetes history: DM 2 Outpatient Diabetes medications: Basaglar 24 units, Novolog 0-15 units tid, Tradjenta 5 mg Daily Current orders for Inpatient glycemic control:  Lantus 28 units Novolog 0-15 units tid + hs  Novolog 4 units tid meal coverage  Inpatient Diabetes Program Recommendations:    Glucose trends elevated.  -  Consider increasing Lantus to 30-32 units. -  Consider increasing Novolog meal coverage to 6 units tid.  Thanks,  Tama Headings RN, MSN, BC-ADM Inpatient Diabetes Coordinator Team Pager [phone] (8a-5p)

## 2018-12-04 NOTE — Progress Notes (Signed)
PROGRESS NOTE  Eddie Davies OXB:[id number] DOB: [birth year] DOA: 11/27/2018 PCP: Practice, [facility]  HPI/Recap of past 57 hours:  48 year old male with history of diabetes type 2, liver cirrhosis end-stage renal disease on hemodialysis on Monday Wednesday and Friday, hypertension, history of ES BL/Klebsiella urinary tract infection who was admitted with abdominal and flank pain of 4 days duration his urine microscopy in the emergency room showed WBC of 50 with many bacteria CT scan was scan of the abdomen and pelvis showed concerning for left renal pyelonephritis with a 13 mm right renal pelvic stone nonobstructing.  He has a history of UTI bacteremia with ESBL in May 2020.  He has hiccup and is being treated with Thorazine  Subjective: Patient seen and examined at bedside.  Complaining that he still has constant hiccups.  Patient is getting Thorazine and  December 04, 2018 subjective: Patient seen and examined at bedside he complained he still has the hiccups.  And he wanted to know what is the cause of it.  I explained to him.  It seems like the Thorazine is starting to work he is not hiccuping as much as she was yesterday  Assessment/Plan: Principal Problem:   Pyelonephritis Active Problems:   End stage liver disease (HCC)   BPH (benign prostatic hyperplasia)   Benign essential HTN   Diabetes mellitus type 2, uncontrolled, with complications ([place])  #1 acute pyelonephritis seen on CT scan of the abdomen and pelvis with evidence concerning for left renal pelvic pyelonephritis and history of Klebsiella UTI.  He was started on meropenem IV he will continue with that and will monitor his CBC he has completed his antibiotics he is ready to be discharged home back to the nursing home COVID-19 ordered  2.  End-stage renal disease on hemodialysis on Monday Wednesday and Friday.  He had hemodialysis today  3.  Anemia of chronic disease with acute worsening.  His admitting  hemoglobin was 8.2 today is 6.9 this might be just dilutional but both consulted with nephrology he agreed with giving him 1 unit of packed RBC we will recheck in the morning  4.  Type 2 diabetes mellitus on insulin continue insulin therapy  Hypertension uncontrolled continue amlodipine and clonidine  6.  History of seizure disorder continue phenobarb and Tegretol  7.  Hiccups.  We will continue Thorazine 3 times daily we will continue Pepcid slightly improved we will give him information about he   Code Status: Full  Severity of Illness: The appropriate patient status for this patient is INPATIENT. Inpatient status is judged to be reasonable and necessary in order to provide the required intensity of service to ensure the patient's safety. The patient's presenting symptoms, physical exam findings, and initial radiographic and laboratory data in the context of their chronic comorbidities is felt to place them at high risk for further clinical deterioration. Furthermore, it is not anticipated that the patient will be medically stable for discharge from the hospital within 2 midnights of admission. The following factors support the patient status of inpatient.   " The patient's presenting symptoms include urinary tract infection. " The worrisome physical exam findings include urinary tract infection with possible pyelonephritis. " The initial radiographic and laboratory data are worrisome because of pyelonephritis/sepsis. " The chronic co-morbidities include end-stage renal disease on hemodialysis history of ESBL/Klebsiella UTI.   * I certify that at the point of admission it is my clinical judgment that the patient will require inpatient hospital care spanning beyond  2 midnights from the point of admission due to high intensity of service, high risk for further deterioration and high frequency of surveillance required.*    Family Communication: Discussed with patient  Disposition Plan: Back  to SNF tomorrow is completed his antibiotics and COVID-19 test has been reordered   Consultants:  Nephrology  Procedures:  Hemodialysis  Antimicrobials:  Meropenem  DVT prophylaxis: Heparin   Objective: Vitals:   12/03/18 2040 12/03/18 2146 12/03/18 2227 12/04/18 0625  BP: 126/70 132/77 136/70 122/73  Pulse: 87 87 82 79  Resp:  18 18 18   Temp: 100.1 F (37.8 C) 99.9 F (37.7 C) 98.5 F (36.9 C) 99.1 F (37.3 C)  TempSrc: Oral Oral Oral Oral  SpO2: 100% 100% 99% 98%  Weight:      Height:        Intake/Output Summary (Last 24 hours) at 12/04/2018 1027 Last data filed at 12/03/2018 1109 Gross per 24 hour  Intake -  Output 1002 ml  Net -1002 ml   Filed Weights   11/30/18 0701 11/30/18 1125 12/03/18 0700  Weight: 104.4 kg 102.3 kg 104.2 kg   Body mass index is 29.49 kg/m.  Exam:  . General: 48 y.o. year-old male well developed well nourished in no acute distress.  Alert and oriented x3. . Cardiovascular: Regular rate and rhythm with no rubs or gallops.  No thyromegaly or JVD noted.   Marland Kitchen Respiratory: Clear to auscultation with no wheezes or rales. Good inspiratory effort. . Abdomen: Soft nontender nondistended with normal bowel sounds x4 quadrants. . Musculoskeletal: No lower extremity edema. 2/4 pulses in all 4 extremities. . Skin: No ulcerative lesions noted or rashes, . Psychiatry: Mood is appropriate for condition and setting    Data Reviewed: CBC: Recent Labs  Lab 11/27/18 2019 11/29/18 0120 11/30/18 0500 12/03/18 1232 12/04/18 0654  WBC 8.2 13.2* 10.9* 9.4  --   NEUTROABS  --   --   --  6.9  --   HGB 8.8* 8.2* 7.3* 6.9* 8.6*  HCT 26.1* 24.3* 21.7* 21.1* 26.5*  MCV 93.5 93.1 93.5 95.9  --   PLT 141* 155 126* 177  --    Basic Metabolic Panel: Recent Labs  Lab 11/28/18 0956 11/29/18 0120 11/30/18 0500 12/01/18 0950 12/02/18 0708 12/03/18 0947  NA 138 133* 132* 134* 133* 132*  K 4.2 3.0* 3.2* 2.7* 3.5  3.5 3.4*  CL 100 93* 94* 98 96*  97*  CO2 16* 24 22 21* 20* 20*  GLUCOSE 524* 305* 385* 259* 362* 314*  BUN 77* 29* 51* 28* 39* 48*  CREATININE 8.56* 4.92* 7.61* 5.56* 7.09* 8.62*  CALCIUM 7.7* 7.8* 7.6* 7.9* 8.0* 7.8*  MG 2.2  --   --   --   --   --   PHOS  --   --   --   --   --  4.5   GFR: Estimated Creatinine Clearance: 13.5 mL/min (A) (by C-G formula based on SCr of 8.62 mg/dL (H)). Liver Function Tests: Recent Labs  Lab 11/27/18 2019 12/03/18 0947  AST 17  --   ALT 14  --   ALKPHOS 125  --   BILITOT 0.7  --   PROT 6.5  --   ALBUMIN 2.7* 2.0*   Recent Labs  Lab 11/27/18 2019  LIPASE 11   No results for input(s): AMMONIA in the last 168 hours. Coagulation Profile: No results for input(s): INR, PROTIME in the last 168 hours. Cardiac Enzymes: No results for  input(s): CKTOTAL, CKMB, CKMBINDEX, TROPONINI in the last 168 hours. BNP (last 3 results) No results for input(s): PROBNP in the last 8760 hours. HbA1C: No results for input(s): HGBA1C in the last 72 hours. CBG: Recent Labs  Lab 12/02/18 2054 12/03/18 1208 12/03/18 1702 12/03/18 2120 12/04/18 0812  GLUCAP 208* 200* 313* 359* 224*   Lipid Profile: No results for input(s): CHOL, HDL, LDLCALC, TRIG, CHOLHDL, LDLDIRECT in the last 72 hours. Thyroid Function Tests: No results for input(s): TSH, T4TOTAL, FREET4, T3FREE, THYROIDAB in the last 72 hours. Anemia Panel: No results for input(s): VITAMINB12, FOLATE, FERRITIN, TIBC, IRON, RETICCTPCT in the last 72 hours. Urine analysis:    Component Value Date/Time   COLORURINE RED (A) 11/28/2018 0431   APPEARANCEUR TURBID (A) 11/28/2018 0431   LABSPEC  11/28/2018 0431    TEST NOT REPORTED DUE TO COLOR INTERFERENCE OF URINE PIGMENT   PHURINE  11/28/2018 0431    TEST NOT REPORTED DUE TO COLOR INTERFERENCE OF URINE PIGMENT   GLUCOSEU (A) 11/28/2018 0431    TEST NOT REPORTED DUE TO COLOR INTERFERENCE OF URINE PIGMENT   HGBUR (A) 11/28/2018 0431    TEST NOT REPORTED DUE TO COLOR INTERFERENCE OF  URINE PIGMENT   BILIRUBINUR (A) 11/28/2018 0431    TEST NOT REPORTED DUE TO COLOR INTERFERENCE OF URINE PIGMENT   KETONESUR (A) 11/28/2018 0431    TEST NOT REPORTED DUE TO COLOR INTERFERENCE OF URINE PIGMENT   PROTEINUR (A) 11/28/2018 0431    TEST NOT REPORTED DUE TO COLOR INTERFERENCE OF URINE PIGMENT   NITRITE (A) 11/28/2018 0431    TEST NOT REPORTED DUE TO COLOR INTERFERENCE OF URINE PIGMENT   LEUKOCYTESUR (A) 11/28/2018 0431    TEST NOT REPORTED DUE TO COLOR INTERFERENCE OF URINE PIGMENT   Sepsis Labs: @LABRCNTIP (procalcitonin:4,lacticidven:4)  ) Recent Results (from the past 240 hour(s))  Urine culture     Status: Abnormal   Collection Time: 11/28/18  4:11 AM   Specimen: Urine, Random  Result Value Ref Range Status   Specimen Description URINE, RANDOM  Final   Special Requests   Final    NONE Performed at [facility], [place] [address], [place], [place] [address]    Culture (A)  Final    >=100,000 COLONIES/mL KLEBSIELLA PNEUMONIAE Confirmed Extended Spectrum Beta-Lactamase Producer (ESBL).  In bloodstream infections from ESBL organisms, carbapenems are preferred over piperacillin/tazobactam. They are shown to have a lower risk of mortality.    Report Status 12/01/2018 FINAL  Final   Organism ID, Bacteria KLEBSIELLA PNEUMONIAE (A)  Final      Susceptibility   Klebsiella pneumoniae - MIC*    AMPICILLIN >=32 RESISTANT Resistant     CEFAZOLIN RESISTANT Resistant     CEFTRIAXONE RESISTANT Resistant     CIPROFLOXACIN 0.5 SENSITIVE Sensitive     GENTAMICIN 8 INTERMEDIATE Intermediate     IMIPENEM <=0.25 SENSITIVE Sensitive     NITROFURANTOIN 64 INTERMEDIATE Intermediate     TRIMETH/SULFA >=320 RESISTANT Resistant     AMPICILLIN/SULBACTAM 8 SENSITIVE Sensitive     PIP/TAZO <=4 SENSITIVE Sensitive     Extended ESBL POSITIVE Resistant     * >=100,000 COLONIES/mL KLEBSIELLA PNEUMONIAE  Blood culture (routine x 2)     Status: Abnormal   Collection Time: 11/28/18  4:15 AM    Specimen: BLOOD LEFT HAND  Result Value Ref Range Status   Specimen Description BLOOD LEFT HAND  Final   Special Requests   Final    BOTTLES DRAWN AEROBIC  AND ANAEROBIC Blood Culture results may not be optimal due to an inadequate volume of blood received in culture bottles   Culture  Setup Time   Final    GRAM POSITIVE COCCI IN CLUSTERS IN BOTH AEROBIC AND ANAEROBIC BOTTLES CRITICAL RESULT CALLED TO, READ BACK BY AND VERIFIED WITH: G. ABBOTT,PHARMD 0425 11/29/2018 T. TYSOR    Culture (A)  Final    STAPHYLOCOCCUS SPECIES (COAGULASE NEGATIVE) THE SIGNIFICANCE OF ISOLATING THIS ORGANISM FROM A SINGLE SET OF BLOOD CULTURES WHEN MULTIPLE SETS ARE DRAWN IS UNCERTAIN. PLEASE NOTIFY THE MICROBIOLOGY DEPARTMENT WITHIN ONE WEEK IF SPECIATION AND SENSITIVITIES ARE REQUIRED. Performed at [facility], [place] [address] [place], [place] [address]    Report Status 11/30/2018 FINAL  Final  Blood culture (routine x 2)     Status: None   Collection Time: 11/28/18  5:26 AM   Specimen: BLOOD RIGHT HAND  Result Value Ref Range Status   Specimen Description BLOOD RIGHT HAND  Final   Special Requests   Final    BOTTLES DRAWN AEROBIC AND ANAEROBIC Blood Culture results may not be optimal due to an inadequate volume of blood received in culture bottles   Culture   Final    NO GROWTH 5 DAYS Performed at [facility], [place] [address] [place], [place] [address]    Report Status 12/03/2018 FINAL  Final  SARS CORONAVIRUS 2 (TAT 6-24 HRS) Nasopharyngeal Nasopharyngeal Swab     Status: None   Collection Time: 11/28/18  6:48 AM   Specimen: Nasopharyngeal Swab  Result Value Ref Range Status   SARS Coronavirus 2 NEGATIVE NEGATIVE Final    Comment: (NOTE) SARS-CoV-2 target nucleic acids are NOT DETECTED. The SARS-CoV-2 RNA is generally detectable in upper and lower respiratory specimens during the acute phase of infection. Negative results do not preclude SARS-CoV-2 infection, do not rule out  co-infections with other pathogens, and should not be used as the sole basis for treatment or other patient management decisions. Negative results must be combined with clinical observations, patient history, and epidemiological information. The expected result is Negative. Fact Sheet for Patients: SugarRoll.be Fact Sheet for Healthcare Providers: https://www.woods-mathews.com/ This test is not yet approved or cleared by the [place] FDA and  has been authorized for detection and/or diagnosis of SARS-CoV-2 by FDA under an Emergency Use Authorization (EUA). This EUA will remain  in effect (meaning this test can be used) for the duration of the COVID-19 declaration under Section 56 4(b)(1) of the Act, 21 U.S.C. section 360bbb-3(b)(1), unless the authorization is terminated or revoked sooner. Performed at [facility], [place] [address], [place], [place] [address]   SARS CORONAVIRUS 2 (TAT 6-24 HRS) Nasopharyngeal Nasopharyngeal Swab     Status: None   Collection Time: 12/01/18  9:26 AM   Specimen: Nasopharyngeal Swab  Result Value Ref Range Status   SARS Coronavirus 2 NEGATIVE NEGATIVE Final    Comment: (NOTE) SARS-CoV-2 target nucleic acids are NOT DETECTED. The SARS-CoV-2 RNA is generally detectable in upper and lower respiratory specimens during the acute phase of infection. Negative results do not preclude SARS-CoV-2 infection, do not rule out co-infections with other pathogens, and should not be used as the sole basis for treatment or other patient management decisions. Negative results must be combined with clinical observations, patient history, and epidemiological information. The expected result is Negative. Fact Sheet for Patients: SugarRoll.be Fact Sheet for Healthcare Providers: https://www.woods-mathews.com/ This test is not yet approved or cleared by the  [place] Architectural technologist and   has been authorized for detection and/or diagnosis of SARS-CoV-2 by FDA under an Print production planner (EUA). This EUA will remain  in effect (meaning this test can be used) for the duration of the COVID-19 declaration under Section 56 4(b)(1) of the Act, 21 U.S.C. section 360bbb-3(b)(1), unless the authorization is terminated or revoked sooner. Performed at [facility], [place] [address], [place], [place] [address]       Studies: No results found.  Scheduled Meds: . sodium chloride   Intravenous Once  . allopurinol  100 mg Oral Daily  . amLODipine  5 mg Oral QHS  . calcium acetate  2,001 mg Oral TID WC  . carbamazepine  800 mg Oral BID  . Chlorhexidine Gluconate Cloth  6 each Topical Q0600  . cholecalciferol  2,000 Units Oral Daily  . famotidine  20 mg Oral Daily  . feeding supplement (NEPRO CARB STEADY)  237 mL Oral BID BM  . feeding supplement (PRO-STAT SUGAR FREE 64)  30 mL Oral BID  . ferrous sulfate  325 mg Oral Q breakfast  . heparin  5,000 Units Subcutaneous Q8H  . insulin aspart  0-15 Units Subcutaneous TID WC  . insulin aspart  0-5 Units Subcutaneous QHS  . insulin aspart  4 Units Subcutaneous TID WC  . insulin glargine  28 Units Subcutaneous Daily  . lactulose  30 g Oral BID  . multivitamin with minerals  1 tablet Oral QHS  . PHENobarbital  259.2 mg Oral QHS  . simvastatin  20 mg Oral QHS  . tamsulosin  0.4 mg Oral QHS    Continuous Infusions: . sodium chloride    . sodium chloride       LOS: 6 days     Cristal Deer, MD [facility]  To reach me or the doctor on call, go to: [email or web address] Password [email or web address]  12/04/2018, 10:27 AM

## 2018-12-05 ENCOUNTER — Other Ambulatory Visit: Payer: Self-pay

## 2018-12-05 DIAGNOSIS — Z992 Dependence on renal dialysis: Secondary | ICD-10-CM

## 2018-12-05 DIAGNOSIS — N186 End stage renal disease: Secondary | ICD-10-CM

## 2018-12-05 LAB — GLUCOSE, CAPILLARY: Glucose-Capillary: 318 mg/dL — ABNORMAL HIGH (ref 70–99)

## 2018-12-05 MED ORDER — BASAGLAR KWIKPEN 100 UNIT/ML ~~LOC~~ SOPN: 28.0000 [IU] | PEN_INJECTOR | Freq: Every day | SUBCUTANEOUS | 0 refills | Status: DC

## 2018-12-05 MED ORDER — PHENOBARBITAL 64.8 MG PO TABS: 259.2000 mg | ORAL_TABLET | Freq: Every day | ORAL | 0 refills | Status: DC

## 2018-12-05 MED ORDER — HYDROCODONE-ACETAMINOPHEN 5-325 MG PO TABS: 1.0000 | ORAL_TABLET | Freq: Four times a day (QID) | ORAL | 0 refills | Status: DC | PRN

## 2018-12-05 NOTE — Social Work (Signed)
Clinical Social Worker facilitated patient discharge including contacting patient family and facility to confirm patient discharge plans.  Clinical information faxed to facility and family agreeable with plan.  CSW arranged ambulance transport via [facility] to [facility] (then pt will be picked up by SNF to return to [facility]) RN to call [phone]  with report prior to discharge.  Clinical Social Worker will sign off for now as social work intervention is no longer needed. Please consult [place] again if new need arises.  [name], MSW, [place] Social Worker [phone]

## 2018-12-05 NOTE — Progress Notes (Signed)
Renal Navigator was notified yesterday that patient is stable for discharge, but awaiting an updated COVID test in order to go back to his SNF. Renal Navigator has arranged for patient's HD treatment to take place at his OP HD clinic/[facility] today given COVID has come back negative and he is being discharged. Navigator appreciates CSW/I. Chasse's assistance. Navigator contacted [facility] Transportation/Carolyn who will pick patient up from OP HD clinic after treatment.  Patient is cleared for discharge from OP HD standpoint and patient will not get HD in the hospital today prior to discharge.  Alphonzo Cruise, [place] Renal Navigator [phone]

## 2018-12-05 NOTE — Progress Notes (Signed)
Attempted to call report to [facility], left message and awaiting return phone call.

## 2018-12-05 NOTE — TOC Transition Note (Signed)
Transition of Care [facility]) - CM/SW Discharge Note   Patient Details  Name: Eddie Davies MRN: [id number] Date of Birth: [birth year]  [facility] CM/SW Contact:  Alexander Mt, [place] Phone Number: 12/05/2018, 8:50 AM   Clinical Narrative:    Pt plan for discharge to [facility] for OP dialysis treatment as per arrangements made by CSW renal navigator Garth Bigness. COVID neg has resulted, await dc summary as requested by CSW to be completed by 10am. PTAR called and bedside RN Lauren aware.   Will contact and update Lorrie with [facility] of plan.    Final next level of care: Skilled Nursing Facility Barriers to Discharge: Continued Medical Work up   Patient Goals and CMS Choice   CMS Medicare.gov Compare Post Acute Care list provided to:: Patient Choice offered to / list presented to : Patient  Discharge Placement   Existing PASRR number confirmed : 11/29/18          Patient chooses bed at: [facility] Patient to be transferred to facility by: [place] Name of family member notified: pt declines Patient and family notified of of transfer: 12/04/18  Discharge Plan and Services In-house Referral: Clinical Social Work Discharge Planning Services: CM Consult Post Acute Care Choice: Resumption of Product/process development scientist, [place]                               Social Determinants of Health (SDOH) Interventions     Readmission Risk Interventions Readmission Risk Prevention Plan 11/29/2018 05/28/2018 05/23/2018  Transportation Screening Complete - Complete  PCP or Specialist Appt within 5-7 Days - - -  Home Care Screening - - -  Medication Review (RN CM) - - -  Medication Review (RN Transport planner) Complete - Complete  PCP or Specialist appointment within 3-5 days of discharge Not Complete - -  PCP/Specialist Appt Not Complete comments pt is SNF resident - -  [place] or [place] Not Complete Complete (No Data)   [place] or Home Care Consult Pt Refusal Comments pt is SNF resident - -  SW Recovery Care/Counseling Consult Complete Complete -  Palliative Care Screening Not Applicable - Not Applicable  Skilled Nursing Facility Complete - Complete  Some recent data might be hidden

## 2018-12-05 NOTE — Discharge Summary (Signed)
Physician Discharge Summary  Eddie Davies GDJ:[id number] DOB: 10-May-1970 DOA: 11/27/2018  PCP: [facility] [place] date: 11/27/2018 Discharge date: 12/05/2018  Admitted From: [facility] Disposition: [facility]  Recommendations for Outpatient Follow-up:  1. Follow up with PCP in 1-2 weeks 2. Increase patient's long-acting insulin to 28 units subcutaneously daily, continue to monitor glucose and adjust dose accordingly  Home Health: No Equipment/Devices: Left chest HD permacath  Discharge Condition: Stable CODE STATUS: Full code Diet recommendation: Heart Healthy / Carb Modified   History of present illness:  48 year old male with history of diabetes type 2, liver cirrhosis end-stage renal disease on hemodialysis on Monday Wednesday and Friday, hypertension, history of ES BL/Klebsiella urinary tract infection who was admitted with abdominal and flank pain of 4 days duration his urine microscopy in the emergency room showed WBC of 50 with many bacteria CT scan was scan of the abdomen and pelvis showed concerning for left renal pyelonephritis with a 13 mm right renal pelvic stone nonobstructing.  He has a history of UTI bacteremia with ESBL in May 2020.   Hospital course:  acute pyelonephritis  seen on CT scan of the abdomen and pelvis with evidence concerning for left renal pelvic pyelonephritis and history of Klebsiella UTI.  Urine culture notable for ESBL Klebsiella pneumonia.  Patient completed a 7-day course of meropenem IV while hospitalized.  End-stage renal disease on hemodialysis  Nephrology was consulted and followed during hospital course.  Has left chest HD permacath in place.  Patient continued on his normal hemodialysis on Monday Wednesdays and Fridays and will continue outpatient follow-up in his HD clinic.  Anemia of chronic disease Hemoglobin down to 6.9 during hospitalization, received 1 unit PRBC.  Hemoglobin at time of discharge stable.   Recommend repeat CBC in 1 week.  Type 2 diabetes mellitus Increase home long-acting insulin from 24 to 28 units.  Continue home Tradjenta 57m PO daily.  Continue monitor glucose closely and adjust insulin regimen as needed.  Hypertension  continue amlodipine and clonidine  History of seizure disorder  continue phenobarb and Tegretol  Discharge Diagnoses:  Active Problems:   End stage liver disease (HCC)   BPH (benign prostatic hyperplasia)   Benign essential HTN   Diabetes mellitus type 2, uncontrolled, with complications (HCC)   ESRD (end stage renal disease) on dialysis ([facility]    Discharge Instructions  Discharge Instructions    Call MD for:  difficulty breathing, headache or visual disturbances   Complete by: As directed    Call MD for:  extreme fatigue   Complete by: As directed    Call MD for:  persistant dizziness or light-headedness   Complete by: As directed    Call MD for:  persistant nausea and vomiting   Complete by: As directed    Call MD for:  severe uncontrolled pain   Complete by: As directed    Call MD for:  temperature >100.4   Complete by: As directed    Diet - low sodium heart healthy   Complete by: As directed    Increase activity slowly   Complete by: As directed      Allergies as of 12/05/2018      Reactions   Fish Oil Swelling, Other (See Comments)         Medication List    TAKE these medications   acetaminophen 325 MG tablet Commonly known as: TYLENOL Take 650 mg by mouth every 8 (eight) hours as needed for fever (pain).  allopurinol 100 MG tablet Commonly known as: ZYLOPRIM Take 100 mg by mouth daily.   amLODipine 5 MG tablet Commonly known as: NORVASC Take 5 mg by mouth at bedtime.   Basaglar KwikPen 100 UNIT/ML Sopn Inject 0.28 mLs (28 Units total) into the skin at bedtime. What changed: how much to take   calcium acetate 667 MG capsule Commonly known as: PHOSLO Take 2,001 mg by mouth 3 (three) times daily with  meals.   carbamazepine 200 MG tablet Commonly known as: TEGRETOL Take 800 mg by mouth 2 (two) times daily.   cloNIDine 0.1 MG tablet Commonly known as: CATAPRES Take 0.1 mg by mouth 2 (two) times daily as needed (SBP ABOVE 170).   Darbepoetin Alfa 200 MCG/0.4ML Sosy injection Commonly known as: ARANESP Inject 0.4 mLs (200 mcg total) into the vein every Thursday with hemodialysis.   feeding supplement (NEPRO CARB STEADY) Liqd Take 237 mLs by mouth 2 (two) times daily between meals.   feeding supplement (PRO-STAT SUGAR FREE 64) Liqd Take 30 mLs by mouth 2 (two) times a day.   ferrous sulfate 325 (65 FE) MG tablet Take 325 mg by mouth daily with breakfast.   guaiFENesin 600 MG 12 hr tablet Commonly known as: MUCINEX Take 600 mg by mouth every 12 (twelve) hours as needed for cough (congestion).   HYDROcodone-acetaminophen 5-325 MG tablet Commonly known as: Norco Take 1 tablet by mouth every 6 (six) hours as needed for moderate pain.   insulin aspart 100 UNIT/ML injection Commonly known as: novoLOG Inject 0-15 Units into the skin 4 (four) times daily -  with meals and at bedtime. 70-120=0 units, 121-150=2 units, 151-200=3 units, 201-250=5 units, 251-300=8 units, 301-350=11 units, 351-400=15 units, >400 call MD   lactulose 10 GM/15ML solution Commonly known as: CHRONULAC Take 15 mLs (10 g total) by mouth 2 (two) times daily. What changed: how much to take   lidocaine-prilocaine cream Commonly known as: EMLA Apply 1 application topically every Monday, Wednesday, and Friday.   multivitamin with minerals Tabs tablet Take 1 tablet by mouth at bedtime.   omeprazole 20 MG capsule Commonly known as: PRILOSEC Take 20 mg by mouth daily.   ondansetron 4 MG tablet Commonly known as: ZOFRAN Take 4 mg by mouth every 8 (eight) hours as needed for nausea or vomiting.   PHENobarbital 64.8 MG tablet Commonly known as: LUMINAL Take 4 tablets (259.2 mg total) by mouth at  bedtime. What changed:   medication strength  how much to take   simvastatin 20 MG tablet Commonly known as: ZOCOR Take 20 mg by mouth at bedtime.   tamsulosin 0.4 MG Caps capsule Commonly known as: FLOMAX Take 0.4 mg by mouth at bedtime.   Tradjenta 5 MG Tabs tablet Generic drug: linagliptin Take 5 mg by mouth daily.   Vitamin D 50 MCG (2000 UT) tablet Take 2,000 Units by mouth daily.      Follow-up Information    Lucas Mallow, MD. Schedule an appointment as soon as possible for a visit in 4 week(s).   Specialty: Urology Contact information: [place] [id number] [phone]          Allergies  Allergen Reactions  . Fish Oil Swelling and Other (See Comments)         Consultations:  Nephrology   Procedures/Studies: Ct Abdomen Pelvis Wo Contrast  Result Date: 11/28/2018 CLINICAL DATA:  Abdominal pain, nausea, vomiting. EXAM: CT ABDOMEN AND PELVIS WITHOUT CONTRAST TECHNIQUE: Multidetector CT imaging of  the abdomen and pelvis was performed following the standard protocol without IV contrast. COMPARISON:  06/18/2018 FINDINGS: Lower chest: Lung bases are clear. No effusions. Heart is normal size. Calcifications in the visualized right coronary artery. Hepatobiliary: No focal liver abnormality is seen. Status post cholecystectomy. No biliary dilatation. Pancreas: Calcifications throughout the pancreas compatible with chronic pancreatitis. Pancreatic atrophy. Findings are stable. Spleen: Splenomegaly with craniocaudal length measuring 18.8 cm, stable. Adrenals/Urinary Tract: Adrenal glands normal. Right renal pelvic stone measures 13 mm, stable. There is extensive bilateral perinephric stranding, similar to prior study. Areas of low-density noted throughout the left kidney which were not definitively seen on prior study. No hydronephrosis. Urinary bladder grossly unremarkable. Small amount of gas in the urinary bladder, presumably from  catheterization. Stomach/Bowel: Stomach, large and small bowel grossly unremarkable. Vascular/Lymphatic: No evidence of aneurysm or adenopathy. Reproductive: No visible focal abnormality. Other: No free fluid or free air. Musculoskeletal: No acute bony abnormality. IMPRESSION: Extensive perinephric stranding is similar prior study. There are areas of ill-defined low-density throughout the left kidney which were not definitively seen on prior study. Cannot exclude areas of infection/pyelonephritis. Recommend clinical correlation. 13 mm right renal pelvic stone. No hydronephrosis. Changes of chronic pancreatitis with calcifications and pancreatic atrophy. Stable splenomegaly. Electronically Signed   By: Rolm Baptise M.D.   On: 11/28/2018 01:38   Dg Chest Port 1 View  Result Date: 11/28/2018 CLINICAL DATA:  Chest pain and abnormal breath sounds. EXAM: PORTABLE CHEST 1 VIEW COMPARISON:  08/03/2018 FINDINGS: Right IJ dialysis catheter unchanged. Lungs are adequately inflated without lobar consolidation or effusion. Cardiomediastinal silhouette and remainder of the exam is unchanged. IMPRESSION: No active disease. Electronically Signed   By: Marin Olp M.D.   On: 11/28/2018 07:54      Subjective: Patient seen and examined at bedside, resting comfortably.  Ready for discharge back to SNF with continue HD outpatient today.  No other complaints or concerns at this time.  Denies headache, no fever/chills/night sweats, no nausea/vomiting/diarrhea, no chest pain, no palpitations, no cough/congestion.  No acute events overnight per nursing staff.   Discharge Exam: Vitals:   12/04/18 1554 12/04/18 2208  BP: 119/72 137/75  Pulse: 80 80  Resp: 18 19  Temp: 98.2 F (36.8 C) 99 F (37.2 C)  SpO2: 98% 98%   Vitals:   12/03/18 2227 12/04/18 0625 12/04/18 1554 12/04/18 2208  BP: 136/70 122/73 119/72 137/75  Pulse: 82 79 80 80  Resp: 18 18 18 19   Temp: 98.5 F (36.9 C) 99.1 F (37.3 C) 98.2 F (36.8 C)  99 F (37.2 C)  TempSrc: Oral Oral Oral Oral  SpO2: 99% 98% 98% 98%  Weight:      Height:        General: Pt is alert, awake, not in acute distress Cardiovascular: RRR, S1/S2 +, no rubs, no gallops Respiratory: CTA bilaterally, no wheezing, no rhonchi Abdominal: Soft, NT, ND, bowel sounds + Extremities: no edema, no cyanosis    The results of significant diagnostics from this hospitalization (including imaging, microbiology, ancillary and laboratory) are listed below for reference.     Microbiology: Recent Results (from the past 240 hour(s))  Urine culture     Status: Abnormal   Collection Time: 11/28/18  4:11 AM   Specimen: Urine, Random  Result Value Ref Range Status   Specimen Description URINE, RANDOM  Final   Special Requests   Final    NONE Performed at [facility], [address] [place], [place] [address]  Culture (A)  Final    >=100,000 COLONIES/mL KLEBSIELLA PNEUMONIAE Confirmed Extended Spectrum Beta-Lactamase Producer (ESBL).  In bloodstream infections from ESBL organisms, carbapenems are preferred over piperacillin/tazobactam. They are shown to have a lower risk of mortality.    Report Status 12/01/2018 FINAL  Final   Organism ID, Bacteria KLEBSIELLA PNEUMONIAE (A)  Final      Susceptibility   Klebsiella pneumoniae - MIC*    AMPICILLIN >=32 RESISTANT Resistant     CEFAZOLIN RESISTANT Resistant     CEFTRIAXONE RESISTANT Resistant     CIPROFLOXACIN 0.5 SENSITIVE Sensitive     GENTAMICIN 8 INTERMEDIATE Intermediate     IMIPENEM <=0.25 SENSITIVE Sensitive     NITROFURANTOIN 64 INTERMEDIATE Intermediate     TRIMETH/SULFA >=320 RESISTANT Resistant     AMPICILLIN/SULBACTAM 8 SENSITIVE Sensitive     PIP/TAZO <=4 SENSITIVE Sensitive     Extended ESBL POSITIVE Resistant     * >=100,000 COLONIES/mL KLEBSIELLA PNEUMONIAE  Blood culture (routine x 2)     Status: Abnormal   Collection Time: 11/28/18  4:15 AM   Specimen: BLOOD LEFT HAND  Result Value Ref  Range Status   Specimen Description BLOOD LEFT HAND  Final   Special Requests   Final    BOTTLES DRAWN AEROBIC AND ANAEROBIC Blood Culture results may not be optimal due to an inadequate volume of blood received in culture bottles   Culture  Setup Time   Final    GRAM POSITIVE COCCI IN CLUSTERS IN BOTH AEROBIC AND ANAEROBIC BOTTLES CRITICAL RESULT CALLED TO, READ BACK BY AND VERIFIED WITH: G. ABBOTT,PHARMD 0425 11/29/2018 T. TYSOR    Culture (A)  Final    STAPHYLOCOCCUS SPECIES (COAGULASE NEGATIVE) THE SIGNIFICANCE OF ISOLATING THIS ORGANISM FROM A SINGLE SET OF BLOOD CULTURES WHEN MULTIPLE SETS ARE DRAWN IS UNCERTAIN. PLEASE NOTIFY THE MICROBIOLOGY DEPARTMENT WITHIN ONE WEEK IF SPECIATION AND SENSITIVITIES ARE REQUIRED. Performed at [facility], [place] [address] [place], [place] [address]    Report Status 11/30/2018 FINAL  Final  Blood culture (routine x 2)     Status: None   Collection Time: 11/28/18  5:26 AM   Specimen: BLOOD RIGHT HAND  Result Value Ref Range Status   Specimen Description BLOOD RIGHT HAND  Final   Special Requests   Final    BOTTLES DRAWN AEROBIC AND ANAEROBIC Blood Culture results may not be optimal due to an inadequate volume of blood received in culture bottles   Culture   Final    NO GROWTH 5 DAYS Performed at [facility], [place] [address] [place], [place] [address]    Report Status 12/03/2018 FINAL  Final  SARS CORONAVIRUS 2 (TAT 6-24 HRS) Nasopharyngeal Nasopharyngeal Swab     Status: None   Collection Time: 11/28/18  6:48 AM   Specimen: Nasopharyngeal Swab  Result Value Ref Range Status   SARS Coronavirus 2 NEGATIVE NEGATIVE Final    Comment: (NOTE) SARS-CoV-2 target nucleic acids are NOT DETECTED. The SARS-CoV-2 RNA is generally detectable in upper and lower respiratory specimens during the acute phase of infection. Negative results do not preclude SARS-CoV-2 infection, do not rule out co-infections with other pathogens, and should not be  used as the sole basis for treatment or other patient management decisions. Negative results must be combined with clinical observations, patient history, and epidemiological information. The expected result is Negative. Fact Sheet for Patients: SugarRoll.be Fact Sheet for Healthcare Providers: https://www.woods-mathews.com/ This test is not yet approved or cleared by  the [employer] and  has been authorized for detection and/or diagnosis of SARS-CoV-2 by FDA under an Emergency Use Authorization (EUA). This EUA will remain  in effect (meaning this test can be used) for the duration of the COVID-19 declaration under Section 56 4(b)(1) of the Act, 21 U.S.C. section 360bbb-3(b)(1), unless the authorization is terminated or revoked sooner. Performed at [facility], [place] [address], [place], [place] [address]   SARS CORONAVIRUS 2 (TAT 6-24 HRS) Nasopharyngeal Nasopharyngeal Swab     Status: None   Collection Time: 12/01/18  9:26 AM   Specimen: Nasopharyngeal Swab  Result Value Ref Range Status   SARS Coronavirus 2 NEGATIVE NEGATIVE Final    Comment: (NOTE) SARS-CoV-2 target nucleic acids are NOT DETECTED. The SARS-CoV-2 RNA is generally detectable in upper and lower respiratory specimens during the acute phase of infection. Negative results do not preclude SARS-CoV-2 infection, do not rule out co-infections with other pathogens, and should not be used as the sole basis for treatment or other patient management decisions. Negative results must be combined with clinical observations, patient history, and epidemiological information. The expected result is Negative. Fact Sheet for Patients: SugarRoll.be Fact Sheet for Healthcare Providers: https://www.woods-mathews.com/ This test is not yet approved or cleared by the [place] FDA and  has been authorized for detection and/or diagnosis of  SARS-CoV-2 by FDA under an Emergency Use Authorization (EUA). This EUA will remain  in effect (meaning this test can be used) for the duration of the COVID-19 declaration under Section 56 4(b)(1) of the Act, 21 U.S.C. section 360bbb-3(b)(1), unless the authorization is terminated or revoked sooner. Performed at [facility], [place] [address] [place], [place] [address]   SARS CORONAVIRUS 2 (TAT 6-24 HRS) Nasopharyngeal Nasopharyngeal Swab     Status: None   Collection Time: 12/04/18  4:40 PM   Specimen: Nasopharyngeal Swab  Result Value Ref Range Status   SARS Coronavirus 2 NEGATIVE NEGATIVE Final    Comment: (NOTE) SARS-CoV-2 target nucleic acids are NOT DETECTED. The SARS-CoV-2 RNA is generally detectable in upper and lower respiratory specimens during the acute phase of infection. Negative results do not preclude SARS-CoV-2 infection, do not rule out co-infections with other pathogens, and should not be used as the sole basis for treatment or other patient management decisions. Negative results must be combined with clinical observations, patient history, and epidemiological information. The expected result is Negative. Fact Sheet for Patients: SugarRoll.be Fact Sheet for Healthcare Providers: https://www.woods-mathews.com/ This test is not yet approved or cleared by the [place] FDA and  has been authorized for detection and/or diagnosis of SARS-CoV-2 by FDA under an Emergency Use Authorization (EUA). This EUA will remain  in effect (meaning this test can be used) for the duration of the COVID-19 declaration under Section 56 4(b)(1) of the Act, 21 U.S.C. section 360bbb-3(b)(1), unless the authorization is terminated or revoked sooner. Performed at [facility], [place] [address] [place], [place] [address]      Labs: BNP (last 3 results) Recent Labs    04/21/18 2209  BNP 237.6*   Basic Metabolic Panel: Recent  Labs  Lab 11/28/18 0956  11/30/18 0500 12/01/18 0950 12/02/18 0708 12/03/18 0947 12/04/18 1414  NA 138   < > 132* 134* 133* 132* 137  K 4.2   < > 3.2* 2.7* 3.5  3.5 3.4* 4.3  CL 100   < > 94* 98 96* 97* 101  CO2 16*   < >  22 21* 20* 20* 22  GLUCOSE 524*   < > 385* 259* 362* 314* 302*  BUN 77*   < > 51* 28* 39* 48* 31*  CREATININE 8.56*   < > 7.61* 5.56* 7.09* 8.62* 6.27*  CALCIUM 7.7*   < > 7.6* 7.9* 8.0* 7.8* 7.9*  MG 2.2  --   --   --   --   --   --   PHOS  --   --   --   --   --  4.5 4.1   < > = values in this interval not displayed.   Liver Function Tests: Recent Labs  Lab 12/03/18 0947 12/04/18 1414  ALBUMIN 2.0* 2.0*   No results for input(s): LIPASE, AMYLASE in the last 168 hours. No results for input(s): AMMONIA in the last 168 hours. CBC: Recent Labs  Lab 11/29/18 0120 11/30/18 0500 12/03/18 1232 12/04/18 0654  WBC 13.2* 10.9* 9.4  --   NEUTROABS  --   --  6.9  --   HGB 8.2* 7.3* 6.9* 8.6*  HCT 24.3* 21.7* 21.1* 26.5*  MCV 93.1 93.5 95.9  --   PLT 155 126* 177  --    Cardiac Enzymes: No results for input(s): CKTOTAL, CKMB, CKMBINDEX, TROPONINI in the last 168 hours. BNP: Invalid input(s): POCBNP CBG: Recent Labs  Lab 12/03/18 2120 12/04/18 0812 12/04/18 1152 12/04/18 1652 12/04/18 2206  GLUCAP 359* 224* 235* 245* 261*   D-Dimer No results for input(s): DDIMER in the last 72 hours. Hgb A1c No results for input(s): HGBA1C in the last 72 hours. Lipid Profile No results for input(s): CHOL, HDL, LDLCALC, TRIG, CHOLHDL, LDLDIRECT in the last 72 hours. Thyroid function studies No results for input(s): TSH, T4TOTAL, T3FREE, THYROIDAB in the last 72 hours.  Invalid input(s): FREET3 Anemia work up No results for input(s): VITAMINB12, FOLATE, FERRITIN, TIBC, IRON, RETICCTPCT in the last 72 hours. Urinalysis    Component Value Date/Time   COLORURINE RED (A) 11/28/2018 0431   APPEARANCEUR TURBID (A) 11/28/2018 0431   LABSPEC  11/28/2018 0431     TEST NOT REPORTED DUE TO COLOR INTERFERENCE OF URINE PIGMENT   PHURINE  11/28/2018 0431    TEST NOT REPORTED DUE TO COLOR INTERFERENCE OF URINE PIGMENT   GLUCOSEU (A) 11/28/2018 0431    TEST NOT REPORTED DUE TO COLOR INTERFERENCE OF URINE PIGMENT   HGBUR (A) 11/28/2018 0431    TEST NOT REPORTED DUE TO COLOR INTERFERENCE OF URINE PIGMENT   BILIRUBINUR (A) 11/28/2018 0431    TEST NOT REPORTED DUE TO COLOR INTERFERENCE OF URINE PIGMENT   KETONESUR (A) 11/28/2018 0431    TEST NOT REPORTED DUE TO COLOR INTERFERENCE OF URINE PIGMENT   PROTEINUR (A) 11/28/2018 0431    TEST NOT REPORTED DUE TO COLOR INTERFERENCE OF URINE PIGMENT   NITRITE (A) 11/28/2018 0431    TEST NOT REPORTED DUE TO COLOR INTERFERENCE OF URINE PIGMENT   LEUKOCYTESUR (A) 11/28/2018 0431    TEST NOT REPORTED DUE TO COLOR INTERFERENCE OF URINE PIGMENT   Sepsis Labs Invalid input(s): PROCALCITONIN,  WBC,  LACTICIDVEN Microbiology Recent Results (from the past 240 hour(s))  Urine culture     Status: Abnormal   Collection Time: 11/28/18  4:11 AM   Specimen: Urine, Random  Result Value Ref Range Status   Specimen Description URINE, RANDOM  Final   Special Requests   Final    NONE Performed at [facility], [place] [address] [place], [place] [address]  Culture (A)  Final    >=100,000 COLONIES/mL KLEBSIELLA PNEUMONIAE Confirmed Extended Spectrum Beta-Lactamase Producer (ESBL).  In bloodstream infections from ESBL organisms, carbapenems are preferred over piperacillin/tazobactam. They are shown to have a lower risk of mortality.    Report Status 12/01/2018 FINAL  Final   Organism ID, Bacteria KLEBSIELLA PNEUMONIAE (A)  Final      Susceptibility   Klebsiella pneumoniae - MIC*    AMPICILLIN >=32 RESISTANT Resistant     CEFAZOLIN RESISTANT Resistant     CEFTRIAXONE RESISTANT Resistant     CIPROFLOXACIN 0.5 SENSITIVE Sensitive     GENTAMICIN 8 INTERMEDIATE Intermediate     IMIPENEM <=0.25 SENSITIVE Sensitive      NITROFURANTOIN 64 INTERMEDIATE Intermediate     TRIMETH/SULFA >=320 RESISTANT Resistant     AMPICILLIN/SULBACTAM 8 SENSITIVE Sensitive     PIP/TAZO <=4 SENSITIVE Sensitive     Extended ESBL POSITIVE Resistant     * >=100,000 COLONIES/mL KLEBSIELLA PNEUMONIAE  Blood culture (routine x 2)     Status: Abnormal   Collection Time: 11/28/18  4:15 AM   Specimen: BLOOD LEFT HAND  Result Value Ref Range Status   Specimen Description BLOOD LEFT HAND  Final   Special Requests   Final    BOTTLES DRAWN AEROBIC AND ANAEROBIC Blood Culture results may not be optimal due to an inadequate volume of blood received in culture bottles   Culture  Setup Time   Final    GRAM POSITIVE COCCI IN CLUSTERS IN BOTH AEROBIC AND ANAEROBIC BOTTLES CRITICAL RESULT CALLED TO, READ BACK BY AND VERIFIED WITH: G. ABBOTT,PHARMD 0425 11/29/2018 T. TYSOR    Culture (A)  Final    STAPHYLOCOCCUS SPECIES (COAGULASE NEGATIVE) THE SIGNIFICANCE OF ISOLATING THIS ORGANISM FROM A SINGLE SET OF BLOOD CULTURES WHEN MULTIPLE SETS ARE DRAWN IS UNCERTAIN. PLEASE NOTIFY THE MICROBIOLOGY DEPARTMENT WITHIN ONE WEEK IF SPECIATION AND SENSITIVITIES ARE REQUIRED. Performed at [facility], [place] [address], [place], [place] [address]    Report Status 11/30/2018 FINAL  Final  Blood culture (routine x 2)     Status: None   Collection Time: 11/28/18  5:26 AM   Specimen: BLOOD RIGHT HAND  Result Value Ref Range Status   Specimen Description BLOOD RIGHT HAND  Final   Special Requests   Final    BOTTLES DRAWN AEROBIC AND ANAEROBIC Blood Culture results may not be optimal due to an inadequate volume of blood received in culture bottles   Culture   Final    NO GROWTH 5 DAYS Performed at [facility], [place] [address] [place],  [address]    Report Status 12/03/2018 FINAL  Final  SARS CORONAVIRUS 2 (TAT 6-24 HRS) Nasopharyngeal Nasopharyngeal Swab     Status: None   Collection Time: 11/28/18  6:48 AM   Specimen: Nasopharyngeal  Swab  Result Value Ref Range Status   SARS Coronavirus 2 NEGATIVE NEGATIVE Final    Comment: (NOTE) SARS-CoV-2 target nucleic acids are NOT DETECTED. The SARS-CoV-2 RNA is generally detectable in upper and lower respiratory specimens during the acute phase of infection. Negative results do not preclude SARS-CoV-2 infection, do not rule out co-infections with other pathogens, and should not be used as the sole basis for treatment or other patient management decisions. Negative results must be combined with clinical observations, patient history, and epidemiological information. The expected result is Negative. Fact Sheet for Patients: SugarRoll.be Fact Sheet for Healthcare Providers: https://www.woods-mathews.com/ This test is not yet approved or cleared by  the [employer] and  has been authorized for detection and/or diagnosis of SARS-CoV-2 by FDA under an Emergency Use Authorization (EUA). This EUA will remain  in effect (meaning this test can be used) for the duration of the COVID-19 declaration under Section 56 4(b)(1) of the Act, 21 U.S.C. section 360bbb-3(b)(1), unless the authorization is terminated or revoked sooner. Performed at [facility], [place] [address] [place], [place] [address]   SARS CORONAVIRUS 2 (TAT 6-24 HRS) Nasopharyngeal Nasopharyngeal Swab     Status: None   Collection Time: 12/01/18  9:26 AM   Specimen: Nasopharyngeal Swab  Result Value Ref Range Status   SARS Coronavirus 2 NEGATIVE NEGATIVE Final    Comment: (NOTE) SARS-CoV-2 target nucleic acids are NOT DETECTED. The SARS-CoV-2 RNA is generally detectable in upper and lower respiratory specimens during the acute phase of infection. Negative results do not preclude SARS-CoV-2 infection, do not rule out co-infections with other pathogens, and should not be used as the sole basis for treatment or other patient management decisions. Negative results must  be combined with clinical observations, patient history, and epidemiological information. The expected result is Negative. Fact Sheet for Patients: SugarRoll.be Fact Sheet for Healthcare Providers: https://www.woods-mathews.com/ This test is not yet approved or cleared by the [place] FDA and  has been authorized for detection and/or diagnosis of SARS-CoV-2 by FDA under an Emergency Use Authorization (EUA). This EUA will remain  in effect (meaning this test can be used) for the duration of the COVID-19 declaration under Section 56 4(b)(1) of the Act, 21 U.S.C. section 360bbb-3(b)(1), unless the authorization is terminated or revoked sooner. Performed at [facility], [place] [address] [place], [place] [address]   SARS CORONAVIRUS 2 (TAT 6-24 HRS) Nasopharyngeal Nasopharyngeal Swab     Status: None   Collection Time: 12/04/18  4:40 PM   Specimen: Nasopharyngeal Swab  Result Value Ref Range Status   SARS Coronavirus 2 NEGATIVE NEGATIVE Final    Comment: (NOTE) SARS-CoV-2 target nucleic acids are NOT DETECTED. The SARS-CoV-2 RNA is generally detectable in upper and lower respiratory specimens during the acute phase of infection. Negative results do not preclude SARS-CoV-2 infection, do not rule out co-infections with other pathogens, and should not be used as the sole basis for treatment or other patient management decisions. Negative results must be combined with clinical observations, patient history, and epidemiological information. The expected result is Negative. Fact Sheet for Patients: SugarRoll.be Fact Sheet for Healthcare Providers: https://www.woods-mathews.com/ This test is not yet approved or cleared by the [place] FDA and  has been authorized for detection and/or diagnosis of SARS-CoV-2 by FDA under an Emergency Use Authorization (EUA). This EUA will remain  in effect  (meaning this test can be used) for the duration of the COVID-19 declaration under Section 56 4(b)(1) of the Act, 21 U.S.C. section 360bbb-3(b)(1), unless the authorization is terminated or revoked sooner. Performed at [facility], [place] [address], [place], [place] [address]      Time coordinating discharge: Over 30 minutes  SIGNED:   Eric J [place] DO  [facility] 12/05/2018, 8:58 AM

## 2018-12-05 NOTE — Progress Notes (Signed)
Report given to [place] at [facility].

## 2018-12-07 ENCOUNTER — Encounter (HOSPITAL_COMMUNITY): Payer: Medicare Other

## 2018-12-11 ENCOUNTER — Other Ambulatory Visit: Payer: Self-pay

## 2018-12-11 ENCOUNTER — Ambulatory Visit (HOSPITAL_COMMUNITY)
Admission: RE | Admit: 2018-12-11 | Discharge: 2018-12-11 | Disposition: A | Discharge status: Home / Self Care | Payer: No Typology Code available for payment source | Source: Ambulatory Visit | Attending: Family | Admitting: Family

## 2018-12-11 ENCOUNTER — Ambulatory Visit (INDEPENDENT_AMBULATORY_CARE_PROVIDER_SITE_OTHER): Payer: Self-pay | Admitting: Physician Assistant

## 2018-12-11 VITALS — BP 113/71 | HR 85 | Temp 97.7°F | Resp 18

## 2018-12-11 DIAGNOSIS — N186 End stage renal disease: Secondary | ICD-10-CM | POA: Insufficient documentation

## 2018-12-11 DIAGNOSIS — Z992 Dependence on renal dialysis: Secondary | ICD-10-CM | POA: Diagnosis not present

## 2018-12-11 LAB — GLUCOSE, CAPILLARY: Glucose-Capillary: 313 mg/dL — ABNORMAL HIGH (ref 70–99)

## 2018-12-11 NOTE — Progress Notes (Signed)
POST OPERATIVE OFFICE NOTE    CC:  F/u for surgery  HPI:  This is a 48 y.o. male who is s/p Right brachiocephalic fistula placed 07/22/34 by Dr. Trula Slade.  The patient is on dialysis via right TDcatheter placed by IR.  He denise pain, loss of motor and loss of sensation.    Allergies  Allergen Reactions  . Fish Oil Swelling and Other (See Comments)         Current Outpatient Medications  Medication Sig Dispense Refill  . acetaminophen (TYLENOL) 325 MG tablet Take 650 mg by mouth every 8 (eight) hours as needed for fever (pain).    Marland Kitchen allopurinol (ZYLOPRIM) 100 MG tablet Take 100 mg by mouth daily.     . Amino Acids-Protein Hydrolys (FEEDING SUPPLEMENT, PRO-STAT SUGAR FREE 64,) LIQD Take 30 mLs by mouth 2 (two) times a day.    Marland Kitchen amLODipine (NORVASC) 5 MG tablet Take 5 mg by mouth at bedtime.    . calcium acetate (PHOSLO) 667 MG capsule Take 2,001 mg by mouth 3 (three) times daily with meals.     . carbamazepine (TEGRETOL) 200 MG tablet Take 800 mg by mouth 2 (two) times daily.    . Cholecalciferol (VITAMIN D) 50 MCG (2000 UT) tablet Take 2,000 Units by mouth daily.    . cloNIDine (CATAPRES) 0.1 MG tablet Take 0.1 mg by mouth 2 (two) times daily as needed (SBP ABOVE 170).     . Darbepoetin Alfa (ARANESP) 200 MCG/0.4ML SOSY injection Inject 0.4 mLs (200 mcg total) into the vein every Thursday with hemodialysis. 1.68 mL   . ferrous sulfate 325 (65 FE) MG tablet Take 325 mg by mouth daily with breakfast.    . guaiFENesin (MUCINEX) 600 MG 12 hr tablet Take 600 mg by mouth every 12 (twelve) hours as needed for cough (congestion).    Marland Kitchen HYDROcodone-acetaminophen (NORCO) 5-325 MG tablet Take 1 tablet by mouth every 6 (six) hours as needed for moderate pain. 30 tablet 0  . insulin aspart (NOVOLOG) 100 UNIT/ML injection Inject 0-15 Units into the skin 4 (four) times daily -  with meals and at bedtime. 70-120=0 units, 121-150=2 units, 151-200=3 units, 201-250=5 units, 251-300=8 units, 301-350=11  units, 351-400=15 units, >400 call MD    . Insulin Glargine (BASAGLAR KWIKPEN) 100 UNIT/ML SOPN Inject 0.28 mLs (28 Units total) into the skin at bedtime. 8.4 mL 0  . lactulose (CHRONULAC) 10 GM/15ML solution Take 15 mLs (10 g total) by mouth 2 (two) times daily. (Patient taking differently: Take 30 g by mouth 2 (two) times daily. ) 236 mL 0  . lidocaine-prilocaine (EMLA) cream Apply 1 application topically every Monday, Wednesday, and Friday.    . Multiple Vitamin (MULTIVITAMIN WITH MINERALS) TABS tablet Take 1 tablet by mouth at bedtime.    . Nutritional Supplements (FEEDING SUPPLEMENT, NEPRO CARB STEADY,) LIQD Take 237 mLs by mouth 2 (two) times daily between meals.  0  . omeprazole (PRILOSEC) 20 MG capsule Take 20 mg by mouth daily.     . ondansetron (ZOFRAN) 4 MG tablet Take 4 mg by mouth every 8 (eight) hours as needed for nausea or vomiting.     Marland Kitchen PHENobarbital (LUMINAL) 64.8 MG tablet Take 4 tablets (259.2 mg total) by mouth at bedtime. 120 tablet 0  . simvastatin (ZOCOR) 20 MG tablet Take 20 mg by mouth at bedtime.   3  . tamsulosin (FLOMAX) 0.4 MG CAPS capsule Take 0.4 mg by mouth at bedtime.    . TRADJENTA 5  MG TABS tablet Take 5 mg by mouth daily.      No current facility-administered medications for this visit.      ROS:  See HPI  Physical Exam:    Incision:  Well healed incision right UE. Extremities:  Grip 5/5, sensation intact Lungs non labored breathing  +------------+----------+-------------+----------+--------------------------+ OUTFLOW VEINPSV (cm/s)Diameter (cm)Depth (cm)         Describe          +------------+----------+-------------+----------+--------------------------+ Prox UA        115        0.77        0.70         branch 0.35cm        +------------+----------+-------------+----------+--------------------------+ Mid UA         120        0.87        0.40   branch x 3 largest 0.443cm  +------------+----------+-------------+----------+--------------------------+ Dist UA        112        1.10        0.56                              +------------+----------+-------------+----------+--------------------------+ AC Fossa       524        0.84        0.71                              +------------+----------+-------------+----------+--------------------------+  Assessment/Plan:  This is a 48 y.o. male who is s/p:Right brachiocephalic fistula   The fistula is maturing very well.  It will be ready to be accessed as of 01/28/2019.  He will f/u as needed.     Roxy Horseman, [name] [facility] [phone]  Clinic MD:  Carlis Abbott

## 2018-12-25 ENCOUNTER — Inpatient Hospital Stay
Admission: EM | Admit: 2018-12-25 | Payer: Medicare Other | Source: Other Acute Inpatient Hospital | Admitting: Internal Medicine

## 2018-12-27 DIAGNOSIS — Z8619 Personal history of other infectious and parasitic diseases: Secondary | ICD-10-CM

## 2018-12-27 DIAGNOSIS — A4150 Gram-negative sepsis, unspecified: Secondary | ICD-10-CM

## 2018-12-27 DIAGNOSIS — N186 End stage renal disease: Secondary | ICD-10-CM

## 2018-12-27 DIAGNOSIS — Z992 Dependence on renal dialysis: Secondary | ICD-10-CM | POA: Insufficient documentation

## 2018-12-27 HISTORY — DX: Gram-negative sepsis, unspecified: A41.50

## 2018-12-27 HISTORY — DX: Personal history of other infectious and parasitic diseases: Z86.19

## 2018-12-27 HISTORY — DX: Dependence on renal dialysis: N18.6

## 2019-01-09 DIAGNOSIS — A419 Sepsis, unspecified organism: Secondary | ICD-10-CM | POA: Insufficient documentation

## 2019-01-09 DIAGNOSIS — R6521 Severe sepsis with septic shock: Secondary | ICD-10-CM

## 2019-01-09 DIAGNOSIS — K7210 Chronic hepatic failure without coma: Secondary | ICD-10-CM

## 2019-01-09 DIAGNOSIS — U071 COVID-19: Secondary | ICD-10-CM

## 2019-01-09 DIAGNOSIS — Z8619 Personal history of other infectious and parasitic diseases: Secondary | ICD-10-CM | POA: Insufficient documentation

## 2019-01-09 HISTORY — DX: Sepsis, unspecified organism: A41.9

## 2019-01-09 HISTORY — DX: COVID-19: U07.1

## 2019-01-09 HISTORY — DX: Sepsis, unspecified organism: R65.21

## 2019-01-09 HISTORY — DX: Chronic hepatic failure without coma: K72.10

## 2019-01-21 DIAGNOSIS — R627 Adult failure to thrive: Secondary | ICD-10-CM

## 2019-01-21 DIAGNOSIS — G7281 Critical illness myopathy: Secondary | ICD-10-CM | POA: Insufficient documentation

## 2019-01-21 DIAGNOSIS — E43 Unspecified severe protein-calorie malnutrition: Secondary | ICD-10-CM

## 2019-01-21 HISTORY — DX: Unspecified severe protein-calorie malnutrition: E43

## 2019-01-21 HISTORY — DX: Adult failure to thrive: R62.7

## 2019-02-05 ENCOUNTER — Ambulatory Visit: Payer: Medicare Other | Admitting: Neurology

## 2019-02-05 DIAGNOSIS — R109 Unspecified abdominal pain: Secondary | ICD-10-CM

## 2019-02-06 DIAGNOSIS — D689 Coagulation defect, unspecified: Secondary | ICD-10-CM

## 2019-02-06 HISTORY — DX: Coagulation defect, unspecified: D68.9

## 2019-02-14 DIAGNOSIS — N99840 Postprocedural hematoma of a genitourinary system organ or structure following a genitourinary system procedure: Secondary | ICD-10-CM

## 2019-02-14 HISTORY — DX: Postprocedural hematoma of a genitourinary system organ or structure following a genitourinary system procedure: N99.840

## 2019-02-15 MED ORDER — INSULIN LISPRO 100 UNIT/ML ~~LOC~~ SOLN
1.00 | SUBCUTANEOUS | Status: DC
Start: 2019-02-13 — End: 2019-02-15

## 2019-02-15 MED ORDER — SODIUM CHLORIDE 0.9 % IV SOLN
10.00 | INTRAVENOUS | Status: DC
Start: ? — End: 2019-02-15

## 2019-02-15 MED ORDER — INSULIN GLARGINE 100 UNIT/ML ~~LOC~~ SOLN
1.00 | SUBCUTANEOUS | Status: DC
Start: 2019-02-13 — End: 2019-02-15

## 2019-02-15 MED ORDER — ONDANSETRON HCL 4 MG/2ML IJ SOLN
4.00 | INTRAMUSCULAR | Status: DC
Start: ? — End: 2019-02-15

## 2019-02-15 MED ORDER — ACETAMINOPHEN 325 MG PO TABS
650.00 | ORAL_TABLET | ORAL | Status: DC
Start: ? — End: 2019-02-15

## 2019-02-15 MED ORDER — PHENOBARBITAL 30 MG PO TABS
259.20 | ORAL_TABLET | ORAL | Status: DC
Start: 2019-02-13 — End: 2019-02-15

## 2019-02-15 MED ORDER — GENERIC EXTERNAL MEDICATION
500.00 | Status: DC
Start: 2019-02-13 — End: 2019-02-15

## 2019-02-15 MED ORDER — GENERIC EXTERNAL MEDICATION
Status: DC
Start: ? — End: 2019-02-15

## 2019-02-15 MED ORDER — EPOETIN ALFA-EPBX 10000 UNIT/ML IJ SOLN
100.00 | INTRAMUSCULAR | Status: DC
Start: 2019-02-15 — End: 2019-02-15

## 2019-02-15 MED ORDER — SODIUM CHLORIDE 0.9 % IJ SOLN
50.00 | INTRAMUSCULAR | Status: DC
Start: ? — End: 2019-02-15

## 2019-02-15 MED ORDER — ALTEPLASE 2 MG IJ SOLR
2.00 | INTRAMUSCULAR | Status: DC
Start: ? — End: 2019-02-15

## 2019-02-15 MED ORDER — GENERIC EXTERNAL MEDICATION
5.00 | Status: DC
Start: ? — End: 2019-02-15

## 2019-02-15 MED ORDER — CARBAMAZEPINE ER 100 MG PO TB12
100.00 | ORAL_TABLET | ORAL | Status: DC
Start: 2019-02-13 — End: 2019-02-15

## 2019-02-15 MED ORDER — SENNOSIDES-DOCUSATE SODIUM 8.6-50 MG PO TABS
2.00 | ORAL_TABLET | ORAL | Status: DC
Start: 2019-02-13 — End: 2019-02-15

## 2019-02-15 MED ORDER — TAMSULOSIN HCL 0.4 MG PO CAPS
0.40 | ORAL_CAPSULE | ORAL | Status: DC
Start: 2019-02-13 — End: 2019-02-15

## 2019-02-15 MED ORDER — ALBUMIN HUMAN 25 % IV SOLN
12.50 | INTRAVENOUS | Status: DC
Start: ? — End: 2019-02-15

## 2019-02-15 MED ORDER — SODIUM BICARBONATE 650 MG PO TABS
650.00 | ORAL_TABLET | ORAL | Status: DC
Start: 2019-02-13 — End: 2019-02-15

## 2019-02-15 MED ORDER — LABETALOL HCL 5 MG/ML IV SOLN
10.00 | INTRAVENOUS | Status: DC
Start: ? — End: 2019-02-15

## 2019-02-15 MED ORDER — ALLOPURINOL 100 MG PO TABS
100.00 | ORAL_TABLET | ORAL | Status: DC
Start: 2019-02-14 — End: 2019-02-15

## 2019-02-15 MED ORDER — DIPHENHYDRAMINE HCL 50 MG/ML IJ SOLN
12.50 | INTRAMUSCULAR | Status: DC
Start: ? — End: 2019-02-15

## 2019-02-15 MED ORDER — PRAVASTATIN SODIUM 40 MG PO TABS
40.00 | ORAL_TABLET | ORAL | Status: DC
Start: 2019-02-13 — End: 2019-02-15

## 2019-02-15 MED ORDER — CALCIUM ACETATE (PHOS BINDER) 667 MG PO CAPS
2001.00 | ORAL_CAPSULE | ORAL | Status: DC
Start: 2019-02-13 — End: 2019-02-15

## 2019-02-15 MED ORDER — CARBAMAZEPINE ER 400 MG PO TB12
800.00 | ORAL_TABLET | ORAL | Status: DC
Start: 2019-02-13 — End: 2019-02-15

## 2019-02-15 MED ORDER — GUAIFENESIN 400 MG PO TABS
400.00 | ORAL_TABLET | ORAL | Status: DC
Start: ? — End: 2019-02-15

## 2019-02-15 MED ORDER — INSULIN LISPRO 100 UNIT/ML ~~LOC~~ SOLN
1.00 | SUBCUTANEOUS | Status: DC
Start: ? — End: 2019-02-15

## 2019-02-15 MED ORDER — LACTULOSE 10 GM/15ML PO SOLN
20.00 | ORAL | Status: DC
Start: 2019-02-13 — End: 2019-02-15

## 2019-02-15 MED ORDER — INSULIN GLARGINE 100 UNIT/ML ~~LOC~~ SOLN
1.00 | SUBCUTANEOUS | Status: DC
Start: ? — End: 2019-02-15

## 2019-02-15 MED ORDER — OXYCODONE HCL 5 MG PO TABS
5.00 | ORAL_TABLET | ORAL | Status: DC
Start: ? — End: 2019-02-15

## 2019-02-15 MED ORDER — PANTOPRAZOLE SODIUM 20 MG PO TBEC
20.00 | DELAYED_RELEASE_TABLET | ORAL | Status: DC
Start: 2019-02-14 — End: 2019-02-15

## 2019-02-15 MED ORDER — VANCOMYCIN HCL 125 MG PO CAPS
125.00 | ORAL_CAPSULE | ORAL | Status: DC
Start: 2019-02-13 — End: 2019-02-15

## 2019-02-15 MED ORDER — SODIUM CHLORIDE 0.9 % IV SOLN
150.00 | INTRAVENOUS | Status: DC
Start: ? — End: 2019-02-15

## 2019-02-25 DIAGNOSIS — Z8616 Personal history of COVID-19: Secondary | ICD-10-CM | POA: Insufficient documentation

## 2019-02-25 HISTORY — DX: Personal history of COVID-19: Z86.16

## 2019-03-12 ENCOUNTER — Emergency Department (HOSPITAL_COMMUNITY): Payer: Medicare Other

## 2019-03-12 ENCOUNTER — Encounter (HOSPITAL_COMMUNITY): Payer: Self-pay

## 2019-03-12 ENCOUNTER — Other Ambulatory Visit: Payer: Self-pay

## 2019-03-12 ENCOUNTER — Inpatient Hospital Stay (HOSPITAL_COMMUNITY)
Admission: EM | Admit: 2019-03-12 | Discharge: 2019-03-23 | DRG: 862 | Disposition: A | Discharge status: Skilled Nursing Facility | Payer: Medicare Other | Source: Skilled Nursing Facility | Attending: Internal Medicine | Admitting: Internal Medicine

## 2019-03-12 DIAGNOSIS — E663 Overweight: Secondary | ICD-10-CM | POA: Diagnosis present

## 2019-03-12 DIAGNOSIS — R7881 Bacteremia: Secondary | ICD-10-CM | POA: Diagnosis present

## 2019-03-12 DIAGNOSIS — E785 Hyperlipidemia, unspecified: Secondary | ICD-10-CM | POA: Diagnosis present

## 2019-03-12 DIAGNOSIS — Z792 Long term (current) use of antibiotics: Secondary | ICD-10-CM

## 2019-03-12 DIAGNOSIS — Z87442 Personal history of urinary calculi: Secondary | ICD-10-CM

## 2019-03-12 DIAGNOSIS — B961 Klebsiella pneumoniae [K. pneumoniae] as the cause of diseases classified elsewhere: Secondary | ICD-10-CM | POA: Diagnosis present

## 2019-03-12 DIAGNOSIS — E7220 Disorder of urea cycle metabolism, unspecified: Secondary | ICD-10-CM

## 2019-03-12 DIAGNOSIS — E876 Hypokalemia: Secondary | ICD-10-CM | POA: Diagnosis not present

## 2019-03-12 DIAGNOSIS — T8143XA Infection following a procedure, organ and space surgical site, initial encounter: Principal | ICD-10-CM | POA: Diagnosis present

## 2019-03-12 DIAGNOSIS — T888XXA Other specified complications of surgical and medical care, not elsewhere classified, initial encounter: Secondary | ICD-10-CM

## 2019-03-12 DIAGNOSIS — A498 Other bacterial infections of unspecified site: Secondary | ICD-10-CM | POA: Diagnosis present

## 2019-03-12 DIAGNOSIS — I120 Hypertensive chronic kidney disease with stage 5 chronic kidney disease or end stage renal disease: Secondary | ICD-10-CM | POA: Diagnosis present

## 2019-03-12 DIAGNOSIS — Y836 Removal of other organ (partial) (total) as the cause of abnormal reaction of the patient, or of later complication, without mention of misadventure at the time of the procedure: Secondary | ICD-10-CM | POA: Diagnosis present

## 2019-03-12 DIAGNOSIS — A419 Sepsis, unspecified organism: Secondary | ICD-10-CM | POA: Diagnosis present

## 2019-03-12 DIAGNOSIS — R569 Unspecified convulsions: Secondary | ICD-10-CM

## 2019-03-12 DIAGNOSIS — Z992 Dependence on renal dialysis: Secondary | ICD-10-CM

## 2019-03-12 DIAGNOSIS — D631 Anemia in chronic kidney disease: Secondary | ICD-10-CM | POA: Diagnosis present

## 2019-03-12 DIAGNOSIS — K6819 Other retroperitoneal abscess: Secondary | ICD-10-CM

## 2019-03-12 DIAGNOSIS — E118 Type 2 diabetes mellitus with unspecified complications: Secondary | ICD-10-CM | POA: Diagnosis present

## 2019-03-12 DIAGNOSIS — Z1612 Extended spectrum beta lactamase (ESBL) resistance: Secondary | ICD-10-CM | POA: Diagnosis present

## 2019-03-12 DIAGNOSIS — K7210 Chronic hepatic failure without coma: Secondary | ICD-10-CM | POA: Diagnosis present

## 2019-03-12 DIAGNOSIS — E8889 Other specified metabolic disorders: Secondary | ICD-10-CM | POA: Diagnosis present

## 2019-03-12 DIAGNOSIS — D6959 Other secondary thrombocytopenia: Secondary | ICD-10-CM | POA: Diagnosis present

## 2019-03-12 DIAGNOSIS — Z794 Long term (current) use of insulin: Secondary | ICD-10-CM

## 2019-03-12 DIAGNOSIS — E1165 Type 2 diabetes mellitus with hyperglycemia: Secondary | ICD-10-CM | POA: Diagnosis present

## 2019-03-12 DIAGNOSIS — N2581 Secondary hyperparathyroidism of renal origin: Secondary | ICD-10-CM | POA: Diagnosis present

## 2019-03-12 DIAGNOSIS — K7290 Hepatic failure, unspecified without coma: Secondary | ICD-10-CM | POA: Diagnosis present

## 2019-03-12 DIAGNOSIS — Z8616 Personal history of COVID-19: Secondary | ICD-10-CM

## 2019-03-12 DIAGNOSIS — Z79899 Other long term (current) drug therapy: Secondary | ICD-10-CM

## 2019-03-12 DIAGNOSIS — N186 End stage renal disease: Secondary | ICD-10-CM | POA: Diagnosis present

## 2019-03-12 DIAGNOSIS — Z905 Acquired absence of kidney: Secondary | ICD-10-CM

## 2019-03-12 DIAGNOSIS — Z6825 Body mass index (BMI) 25.0-25.9, adult: Secondary | ICD-10-CM

## 2019-03-12 DIAGNOSIS — E1122 Type 2 diabetes mellitus with diabetic chronic kidney disease: Secondary | ICD-10-CM | POA: Diagnosis present

## 2019-03-12 DIAGNOSIS — K7460 Unspecified cirrhosis of liver: Secondary | ICD-10-CM | POA: Diagnosis present

## 2019-03-12 DIAGNOSIS — K6811 Postprocedural retroperitoneal abscess: Secondary | ICD-10-CM | POA: Diagnosis present

## 2019-03-12 DIAGNOSIS — L219 Seborrheic dermatitis, unspecified: Secondary | ICD-10-CM | POA: Diagnosis present

## 2019-03-12 DIAGNOSIS — F17210 Nicotine dependence, cigarettes, uncomplicated: Secondary | ICD-10-CM | POA: Diagnosis present

## 2019-03-12 DIAGNOSIS — G40909 Epilepsy, unspecified, not intractable, without status epilepticus: Secondary | ICD-10-CM | POA: Diagnosis present

## 2019-03-12 DIAGNOSIS — N200 Calculus of kidney: Secondary | ICD-10-CM | POA: Diagnosis present

## 2019-03-12 DIAGNOSIS — I10 Essential (primary) hypertension: Secondary | ICD-10-CM | POA: Diagnosis present

## 2019-03-12 LAB — COMPREHENSIVE METABOLIC PANEL
ALT: 8 U/L (ref 0–44)
AST: 10 U/L — ABNORMAL LOW (ref 15–41)
Albumin: 1.8 g/dL — ABNORMAL LOW (ref 3.5–5.0)
Alkaline Phosphatase: 124 U/L (ref 38–126)
Anion gap: 13 (ref 5–15)
BUN: 40 mg/dL — ABNORMAL HIGH (ref 6–20)
CO2: 23 mmol/L (ref 22–32)
Calcium: 7.7 mg/dL — ABNORMAL LOW (ref 8.9–10.3)
Chloride: 104 mmol/L (ref 98–111)
Creatinine, Ser: 5.72 mg/dL — ABNORMAL HIGH (ref 0.61–1.24)
GFR calc Af Amer: 12 mL/min — ABNORMAL LOW (ref >60)
GFR calc non Af Amer: 11 mL/min — ABNORMAL LOW (ref >60)
Glucose, Bld: 59 mg/dL — ABNORMAL LOW (ref 70–99)
Potassium: 2.6 mmol/L — CL (ref 3.5–5.1)
Sodium: 140 mmol/L (ref 135–145)
Total Bilirubin: 0.8 mg/dL (ref 0.3–1.2)
Total Protein: 6.6 g/dL (ref 6.5–8.1)

## 2019-03-12 LAB — CBC WITH DIFFERENTIAL/PLATELET
Abs Immature Granulocytes: 0.07 10*3/uL (ref 0.00–0.07)
Basophils Absolute: 0 10*3/uL (ref 0.0–0.1)
Basophils Relative: 0 %
Eosinophils Absolute: 0.1 10*3/uL (ref 0.0–0.5)
Eosinophils Relative: 1 %
HCT: 25.9 % — ABNORMAL LOW (ref 39.0–52.0)
Hemoglobin: 8.3 g/dL — ABNORMAL LOW (ref 13.0–17.0)
Immature Granulocytes: 1 %
Lymphocytes Relative: 9 %
Lymphs Abs: 0.8 10*3/uL (ref 0.7–4.0)
MCH: 30.2 pg (ref 26.0–34.0)
MCHC: 32 g/dL (ref 30.0–36.0)
MCV: 94.2 fL (ref 80.0–100.0)
Monocytes Absolute: 0.6 10*3/uL (ref 0.1–1.0)
Monocytes Relative: 6 %
Neutro Abs: 7 10*3/uL (ref 1.7–7.7)
Neutrophils Relative %: 83 %
Platelets: 228 10*3/uL (ref 150–400)
RBC: 2.75 MIL/uL — ABNORMAL LOW (ref 4.22–5.81)
RDW: 16.4 % — ABNORMAL HIGH (ref 11.5–15.5)
WBC: 8.5 10*3/uL (ref 4.0–10.5)
nRBC: 0 % (ref 0.0–0.2)

## 2019-03-12 LAB — LIPASE, BLOOD: Lipase: 13 U/L (ref 11–51)

## 2019-03-12 LAB — AMMONIA: Ammonia: 61 umol/L — ABNORMAL HIGH (ref 9–35)

## 2019-03-12 LAB — LACTIC ACID, PLASMA: Lactic Acid, Venous: 0.8 mmol/L (ref 0.5–1.9)

## 2019-03-12 MED ORDER — POTASSIUM CHLORIDE CRYS ER 20 MEQ PO TBCR
40.0000 meq | EXTENDED_RELEASE_TABLET | Freq: Once | ORAL | Status: AC
  Administered 2019-03-13: 40 meq via ORAL
  Filled 2019-03-12: qty 2

## 2019-03-12 MED ORDER — DEXTROSE 50 % IV SOLN
0.5000 | Freq: Once | INTRAVENOUS | Status: AC
  Administered 2019-03-12: 23:00:00 25 mL via INTRAVENOUS
  Filled 2019-03-12: qty 50

## 2019-03-12 MED ORDER — PIPERACILLIN-TAZOBACTAM 3.375 G IVPB: 3.3750 g | Freq: Once | INTRAVENOUS | Status: DC

## 2019-03-12 MED ORDER — LACTATED RINGERS IV SOLN: INTRAVENOUS | Status: DC

## 2019-03-12 MED ORDER — SODIUM CHLORIDE 0.9 % IV BOLUS
250.0000 mL | Freq: Once | INTRAVENOUS | Status: AC
  Administered 2019-03-12: 250 mL via INTRAVENOUS

## 2019-03-12 MED ORDER — ACETAMINOPHEN 325 MG PO TABS
650.0000 mg | ORAL_TABLET | Freq: Once | ORAL | Status: AC
  Administered 2019-03-13: 650 mg via ORAL
  Filled 2019-03-12: qty 2

## 2019-03-12 MED ORDER — POTASSIUM CHLORIDE 10 MEQ/100ML IV SOLN
10.0000 meq | INTRAVENOUS | Status: AC
  Administered 2019-03-12 – 2019-03-13 (×3): 10 meq via INTRAVENOUS
  Filled 2019-03-12 (×3): qty 100

## 2019-03-12 MED ORDER — SODIUM CHLORIDE 0.9 % IV SOLN
500.0000 mg | INTRAVENOUS | Status: DC
  Administered 2019-03-13 – 2019-03-16 (×4): 500 mg via INTRAVENOUS
  Filled 2019-03-12 (×5): qty 0.5

## 2019-03-12 MED ORDER — IOHEXOL 300 MG/ML  SOLN
100.0000 mL | Freq: Once | INTRAMUSCULAR | Status: AC | PRN
  Administered 2019-03-12: 100 mL via INTRAVENOUS

## 2019-03-12 MED ORDER — SODIUM CHLORIDE 0.9 % IV SOLN
1.0000 g | Freq: Once | INTRAVENOUS | Status: AC
  Administered 2019-03-13: 1 g via INTRAVENOUS
  Filled 2019-03-12: qty 1

## 2019-03-12 NOTE — Progress Notes (Signed)
Pharmacy Antibiotic Note  Eddie Davies is a 49 y.o. male admitted on 03/12/2019 with sepsis, hx of ESBL Kleb pneumo in UCx 11/28/18 - sens to unasyn/merrem//zosyn, however with concern for sepsis and hx of blood stream infections will go with Merrem for now.   ESRD-HD, usually MWF Pharmacy has been consulted for Merrem dosing.  Plan: Merrem 500 mg IV every 24 hours Monitor HD schedule, Cx and clinical progression to narrow  Height: 6' 2"  (188 cm) Weight: 200 lb (90.7 kg) IBW/kg (Calculated) : 82.2  Temp (24hrs), Avg:101.1 F (38.4 C), Min:100 F (37.8 C), Max:102.8 F (39.3 C)  No results for input(s): WBC, CREATININE, LATICACIDVEN, VANCOTROUGH, VANCOPEAK, VANCORANDOM, GENTTROUGH, GENTPEAK, GENTRANDOM, TOBRATROUGH, TOBRAPEAK, TOBRARND, AMIKACINPEAK, AMIKACINTROU, AMIKACIN in the last 168 hours.  CrCl cannot be calculated (Patient's most recent lab result is older than the maximum 21 days allowed.).    Allergies  Allergen Reactions  . Fish Oil Swelling and Other (See Comments)        Eddie Davies, PharmD Clinical Pharmacist Please check AMION for all [name] numbers 03/12/2019 10:11 PM

## 2019-03-12 NOTE — ED Provider Notes (Signed)
[facility] EMERGENCY DEPARTMENT Provider Note   CSN: [id number] Arrival date & time: 03/12/19  2035     History Chief Complaint  Patient presents with  . Emesis    Eddie Davies is a 49 y.o. male.  Patient from [place] facility, history of end-stage renal disease on hemodialysis Monday, Wednesday, Friday in [place], history of diabetes, history of cirrhosis, recent admissions for complicated emphysematous pyelonephritis --presents with 2 to 3-day history of nausea and vomiting.  Patient states that he has been unable to keep down any liquids or solids.  He has refused dialysis and oral medications at facility.  Temperature 100.6 F on arrival.  Patient denies any abdominal pain.  States that he does make urine.  He had a stent placed recently removed, and states that he did have hematuria up until 4-5 days ago but this is now better.  He states that he has some pain "where they took out my kidney".  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 Cirrhosis ([place]) 04/2018  . Diabetes mellitus without complication (HCC)    type II  . ESRD (end stage renal disease) ([place])    MWF   . Hypertension   . Liver failure ([place])   . Metabolic acidosis 93/5701  . Pneumonia      Patient Active Problem List   Diagnosis Date Noted  . Bacteremia due to Klebsiella pneumoniae 06/19/2018  . Fever 06/18/2018  . ESRD (end stage renal disease) on dialysis ([place]) 06/18/2018  . Anemia due to chronic kidney disease 06/18/2018  . Leukocytosis 06/18/2018  . Acute metabolic encephalopathy 77/93/9030  . Seizures ([place]) 05/24/2018  . BPH (benign prostatic hyperplasia) 05/24/2018  . Sepsis ([place]) 05/24/2018  . Benign essential HTN 05/24/2018  . HLD (hyperlipidemia) 05/24/2018  . Diabetes mellitus type 2, uncontrolled, with complications ([place]) 10/16/3005  . Thrombocytopenia ([place]) 05/24/2018  . Confusion   . Metabolic acidosis 62/26/3335  . Respiratory failure ([place])   . Acute renal failure superimposed on stage 4 chronic kidney disease ([place])   . End stage liver disease ([place])   . Pressure injury of skin 04/21/2018  . Altered mental status, unspecified 04/21/2018  . HCAP (healthcare-associated pneumonia) 04/21/2018  . Acute renal failure (ARF) ([place]) 04/07/2018    Past Surgical History:  Procedure Laterality Date  . AV FISTULA PLACEMENT Right 10/23/2018   Procedure: ARTERIOVENOUS (AV) FISTULA CREATION RIGHT ARM;  Surgeon: [name], MD;  Location: [facility];  Service: Vascular;  Laterality: Right;  . CHOLECYSTECTOMY    . IR FLUORO GUIDE CV LINE RIGHT  05/30/2018  . IR US GUIDE VASC ACCESS RIGHT  05/30/2018       History reviewed. No pertinent family history.  Social History   Tobacco Use  . Smoking status: Current Some Day Smoker    Packs/day: 0.25    Types: Cigarettes  . Smokeless tobacco: Never Used  Substance Use Topics  . Alcohol  use: Not Currently  . Drug use: Never    Home Medications Prior to Admission medications   Medication Sig Start Date End Date Taking? Authorizing Provider  acetaminophen (TYLENOL) 325 MG tablet Take 650 mg by mouth every 8 (eight) hours as needed for fever (pain).    [provider]  allopurinol (ZYLOPRIM) 100 MG tablet  Take 100 mg by mouth daily.  08/01/18   [provider]  Amino Acids-Protein Hydrolys (FEEDING SUPPLEMENT, PRO-STAT SUGAR FREE 64,) LIQD Take 30 mLs by mouth 2 (two) times a day.    [provider]  amLODipine (NORVASC) 5 MG tablet Take 5 mg by mouth at bedtime.    [provider]  calcium acetate (PHOSLO) 667 MG capsule Take 2,001 mg by mouth 3 (three) times daily with meals.     [provider]  carbamazepine (TEGRETOL) 200 MG tablet Take 800 mg by mouth 2 (two) times daily.    [provider]  Cholecalciferol (VITAMIN D) 50 MCG (2000 UT) tablet Take 2,000 Units by mouth daily.    [provider]  cloNIDine (CATAPRES) 0.1 MG tablet Take 0.1 mg by mouth 2 (two) times daily as needed (SBP ABOVE 170).  07/25/18   [provider]  Darbepoetin Alfa (ARANESP) 200 MCG/0.4ML SOSY injection Inject 0.4 mLs (200 mcg total) into the vein every Thursday with hemodialysis. 06/28/18   Amin, Jeanella Flattery, MD  ferrous sulfate 325 (65 FE) MG tablet Take 325 mg by mouth daily with breakfast.    [provider]  guaiFENesin (MUCINEX) 600 MG 12 hr tablet Take 600 mg by mouth every 12 (twelve) hours as needed for cough (congestion).    [provider]  HYDROcodone-acetaminophen (NORCO) 5-325 MG tablet Take 1 tablet by mouth every 6 (six) hours as needed for moderate pain. 12/05/18   [place] Eric J, DO  insulin aspart (NOVOLOG) 100 UNIT/ML injection Inject 0-15 Units into the skin 4 (four) times daily -  with meals and at bedtime. 70-120=0 units, 121-150=2 units, 151-200=3 units, 201-250=5 units, 251-300=8 units, 301-350=11 units, 351-400=15 units, >400 call MD    [provider]  Insulin Glargine (BASAGLAR KWIKPEN) 100 UNIT/ML SOPN Inject 0.28 mLs (28 Units total) into the skin at bedtime. 12/05/18 01/04/19  [place] Donnamarie Poag, DO  lactulose (CHRONULAC) 10 GM/15ML solution Take 15 mLs (10 g total) by mouth 2 (two) times daily. Patient taking  differently: Take 30 g by mouth 2 (two) times daily.  05/31/18   [name], Vernelle Emerald, MD  lidocaine-prilocaine (EMLA) cream Apply 1 application topically every Monday, Wednesday, and Friday.    [provider]  Multiple Vitamin (MULTIVITAMIN WITH MINERALS) TABS tablet Take 1 tablet by mouth at bedtime.    [provider]  Nutritional Supplements (FEEDING SUPPLEMENT, NEPRO CARB STEADY,) LIQD Take 237 mLs by mouth 2 (two) times daily between meals. 04/30/18   [name], [name], MD  omeprazole (PRILOSEC) 20 MG capsule Take 20 mg by mouth daily.  07/04/18   [provider]  ondansetron (ZOFRAN) 4 MG tablet Take 4 mg by mouth every 8 (eight) hours as needed for nausea or vomiting.  08/03/18   [provider]  PHENobarbital (LUMINAL) 64.8 MG tablet Take 4 tablets (259.2 mg total) by mouth at bedtime. 12/05/18 01/04/19  [place] Donnamarie Poag, DO  simvastatin (ZOCOR) 20 MG tablet Take 20 mg by mouth at bedtime.     [provider]  tamsulosin (FLOMAX) 0.4 MG CAPS capsule Take 0.4 mg by mouth  at bedtime.    [provider]  TRADJENTA 5 MG TABS tablet Take 5 mg by mouth daily.  10/16/18   [provider]    Allergies    Fish oil  Review of Systems   Review of Systems  Constitutional: Positive for chills, fatigue and fever.  HENT: Negative for rhinorrhea and sore throat.   Eyes: Negative for redness.  Respiratory: Negative for cough.   Cardiovascular: Negative for chest pain.  Gastrointestinal: Positive for nausea and vomiting. Negative for abdominal pain and diarrhea.  Genitourinary: Positive for flank pain. Negative for dysuria.  Musculoskeletal: Negative for myalgias.  Skin: Negative for rash.  Neurological: Negative for headaches.    Physical Exam Updated Vital Signs BP (!) 157/82 (BP Location: Right Arm)   Pulse 88   Temp (!) 100.6 F (38.1 C) (Oral)   Resp 18   Ht 6' 2"  (1.88 m)   Wt 90.7 kg   SpO2 98%   BMI 25.68 kg/m   Physical  Exam Vitals and nursing note reviewed.  Constitutional:      Appearance: He is well-developed.  HENT:     Head: Normocephalic and atraumatic.     Mouth/Throat:     Mouth: Mucous membranes are dry.  Eyes:     General:        Right eye: No discharge.        Left eye: No discharge.     Conjunctiva/sclera: Conjunctivae normal.  Cardiovascular:     Rate and Rhythm: Normal rate and regular rhythm.     Heart sounds: Normal heart sounds.     Comments: Dialysis catheter in right upper chest wall, no surrounding erythema or purulence. Pulmonary:     Effort: Pulmonary effort is normal.     Breath sounds: Normal breath sounds.  Abdominal:     Palpations: Abdomen is soft.     Tenderness: There is no abdominal tenderness. There is no guarding or rebound.  Musculoskeletal:     Cervical back: Normal range of motion and neck supple.  Skin:    General: Skin is warm and dry.     Comments: Thin, dry, flaking skin on extremities. Severe seborrheic dermatitis of scalp.  Neurological:     Mental Status: He is alert.     ED Results / Procedures / Treatments   Labs (all labs ordered are listed, but only abnormal results are displayed) Labs Reviewed  COMPREHENSIVE METABOLIC PANEL - Abnormal; Notable for the following components:      Result Value   Potassium 2.6 (*)    Glucose, Bld 59 (*)    BUN 40 (*)    Creatinine, Ser 5.72 (*)    Calcium 7.7 (*)    Albumin 1.8 (*)    AST 10 (*)    GFR calc non Af Amer 11 (*)    GFR calc Af Amer 12 (*)    All other components within normal limits  AMMONIA - Abnormal; Notable for the following components:   Ammonia 61 (*)    All other components within normal limits  CULTURE, BLOOD (ROUTINE X 2)  CULTURE, BLOOD (ROUTINE X 2)  URINE CULTURE  LACTIC ACID, PLASMA  LIPASE, BLOOD  LACTIC ACID, PLASMA  CBC WITH DIFFERENTIAL/PLATELET  URINALYSIS, ROUTINE W REFLEX MICROSCOPIC  CBC WITH DIFFERENTIAL/PLATELET  PROTIME-INR  APTT  MAGNESIUM    EKG EKG  Interpretation  Date/Time:  Tuesday March 12 2019 20:45:35 EST Ventricular Rate:  94 PR Interval:    QRS Duration: 110  QT Interval:  355 QTC Calculation: 444 R Axis:   -18 Text Interpretation: Atrial flutter with predominant 3:1 AV block Borderline left axis deviation RSR' in V1 or V2, probably normal variant Since last tracing rate faster Confirmed by Noemi Chapel [phone]) on 03/12/2019 10:36:00 PM   Radiology DG Chest Port 1 View  Result Date: 03/12/2019 CLINICAL DATA:  49 year old male with chest pain shortness of breath fever nausea vomiting. EXAM: PORTABLE CHEST 1 VIEW COMPARISON:  Portable chest 12/25/2018 and earlier. FINDINGS: Portable AP semi upright view at 2234 hours. Lower lung volumes. Mediastinal contours remain within normal limits. Stable dual lumen right chest dialysis type catheter. Visualized tracheal air column is within normal limits. Crowding of lung markings. No pneumothorax, pleural effusion or consolidation. Increased pulmonary vascular congestion but no overt edema. No acute osseous abnormality identified. IMPRESSION: Lower lung volumes and increased pulmonary vascular congestion since December. No other acute cardiopulmonary abnormality. Electronically Signed   By: Genevie Ann M.D.   On: 03/12/2019 22:41    Procedures Procedures (including critical care time)  Medications Ordered in ED Medications  meropenem (MERREM) 1 g in sodium chloride 0.9 % 100 mL IVPB (has no administration in time range)  meropenem (MERREM) 500 mg in sodium chloride 0.9 % 100 mL IVPB (has no administration in time range)  potassium chloride 10 mEq in 100 mL IVPB (10 mEq Intravenous New Bag/Given 03/12/19 2324)  lactated ringers infusion (has no administration in time range)  sodium chloride 0.9 % bolus 250 mL (0 mLs Intravenous Stopped 03/12/19 2314)  iohexol (OMNIPAQUE) 300 MG/ML solution 100 mL (100 mLs Intravenous Contrast Given 03/12/19 2246)  dextrose 50 % solution 25 mL (25 mLs  Intravenous Given 03/12/19 2319)    ED Course  I have reviewed the triage vital signs and the nursing notes.  Pertinent labs & imaging results that were available during my care of the patient were reviewed by me and considered in my medical decision making (see chart for details).  Patient seen and examined.  Extensive review of recent hospitalization records and care everywhere.  Work-up initiated. Fluids ordered.    Vital signs reviewed and are as follows: BP (!) 157/82 (BP Location: Right Arm)   Pulse 88   Temp (!) 100.6 F (38.1 C) (Oral)   Resp 18   Ht 6' 2"  (1.88 m)   Wt 90.7 kg   SpO2 98%   BMI 25.68 kg/m   Discussed antibiotic selection with pharmacist who has reviewed previous sensitivities.  Will start patient on meropenem.  Patient will need admission.  No Covid due to positive test in December 2020.  10:38 PM Discussed with Dr. Sabra Heck who has seen.   11:27 PM CT demonstrates recollection of retroperitoneal fluid.   CRITICAL CARE Performed by: Carlisle Cater [name] Total critical care time: 45 minutes Critical care time was exclusive of separately billable procedures and treating other patients. Critical care was necessary to treat or prevent imminent or life-threatening deterioration. Critical care was time spent personally by me on the following activities: development of treatment plan with patient and/or surrogate as well as nursing, discussions with consultants, evaluation of patient's response to treatment, examination of patient, obtaining history from patient or surrogate, ordering and performing treatments and interventions, ordering and review of laboratory studies, ordering and review of radiographic studies, pulse oximetry and re-evaluation of patient's condition.     MDM Rules/Calculators/A&P  Pending completion of work-up.    Final Clinical Impression(s) / ED Diagnoses Final diagnoses:  Sepsis without acute organ dysfunction,  due to unspecified organism ([place])  Hypokalemia  Hyperammonemia [facility])    Rx / DC Orders ED Discharge Orders    None       Carlisle Cater, Hershal Coria 03/12/19 2327    Noemi Chapel, MD 03/15/19 1309

## 2019-03-12 NOTE — ED Triage Notes (Signed)
Patient brought from [facility] home with complaints of vomiting x3days. Nursing home states patient has refused his last two dialysis appointments and refused all of his medications the past few days.

## 2019-03-12 NOTE — ED Provider Notes (Signed)
Medical screening examination/treatment/procedure(s) were conducted as a shared visit with non-physician practitioner(s) and myself.  I personally evaluated the patient during the encounter.  Clinical Impression:   Final diagnoses:  Sepsis without acute organ dysfunction, due to unspecified organism ([place])  Hypokalemia  Hyperammonemia ([place])    This unfortunate patient is a 49 year old male recently admitted to the hospital, ended up requiring a nephrectomy and presents today from a nursing facility with fever and pain at the incision site.  The patient is mildly somnolent but arousable redirectable answers questions.  He appears generally weak, he is on dialysis.  On exam the patient has an abdomen which is essentially soft but tender over the left incision site, there is no surrounding cellulitis.  He is not tachycardic, I do not auscultate any obvious murmurs, his lungs are clear and he does not appear to be in respiratory distress.  Mucous membranes do appear dry.  This patient will need admission to the hospital for what appears to be recurrent sepsis.  He is critically ill, please see critical care documentation.  He is not hypotensive, lactic acid 0.8.  CT scan pending     Noemi Chapel, MD 03/15/19 1308

## 2019-03-13 ENCOUNTER — Other Ambulatory Visit: Payer: Self-pay

## 2019-03-13 DIAGNOSIS — F17210 Nicotine dependence, cigarettes, uncomplicated: Secondary | ICD-10-CM | POA: Diagnosis present

## 2019-03-13 DIAGNOSIS — D631 Anemia in chronic kidney disease: Secondary | ICD-10-CM | POA: Diagnosis present

## 2019-03-13 DIAGNOSIS — K7460 Unspecified cirrhosis of liver: Secondary | ICD-10-CM | POA: Diagnosis present

## 2019-03-13 DIAGNOSIS — L219 Seborrheic dermatitis, unspecified: Secondary | ICD-10-CM | POA: Diagnosis present

## 2019-03-13 DIAGNOSIS — K7290 Hepatic failure, unspecified without coma: Secondary | ICD-10-CM

## 2019-03-13 DIAGNOSIS — I10 Essential (primary) hypertension: Secondary | ICD-10-CM

## 2019-03-13 DIAGNOSIS — Z1612 Extended spectrum beta lactamase (ESBL) resistance: Secondary | ICD-10-CM | POA: Diagnosis not present

## 2019-03-13 DIAGNOSIS — Y836 Removal of other organ (partial) (total) as the cause of abnormal reaction of the patient, or of later complication, without mention of misadventure at the time of the procedure: Secondary | ICD-10-CM | POA: Diagnosis present

## 2019-03-13 DIAGNOSIS — D6959 Other secondary thrombocytopenia: Secondary | ICD-10-CM | POA: Diagnosis present

## 2019-03-13 DIAGNOSIS — A419 Sepsis, unspecified organism: Secondary | ICD-10-CM

## 2019-03-13 DIAGNOSIS — Z992 Dependence on renal dialysis: Secondary | ICD-10-CM | POA: Diagnosis not present

## 2019-03-13 DIAGNOSIS — I120 Hypertensive chronic kidney disease with stage 5 chronic kidney disease or end stage renal disease: Secondary | ICD-10-CM | POA: Diagnosis present

## 2019-03-13 DIAGNOSIS — A498 Other bacterial infections of unspecified site: Secondary | ICD-10-CM | POA: Diagnosis present

## 2019-03-13 DIAGNOSIS — Z905 Acquired absence of kidney: Secondary | ICD-10-CM | POA: Diagnosis not present

## 2019-03-13 DIAGNOSIS — K6819 Other retroperitoneal abscess: Secondary | ICD-10-CM

## 2019-03-13 DIAGNOSIS — N200 Calculus of kidney: Secondary | ICD-10-CM | POA: Diagnosis present

## 2019-03-13 DIAGNOSIS — R7881 Bacteremia: Secondary | ICD-10-CM | POA: Diagnosis present

## 2019-03-13 DIAGNOSIS — E118 Type 2 diabetes mellitus with unspecified complications: Secondary | ICD-10-CM | POA: Diagnosis not present

## 2019-03-13 DIAGNOSIS — N3090 Cystitis, unspecified without hematuria: Secondary | ICD-10-CM | POA: Diagnosis not present

## 2019-03-13 DIAGNOSIS — E1122 Type 2 diabetes mellitus with diabetic chronic kidney disease: Secondary | ICD-10-CM | POA: Diagnosis present

## 2019-03-13 DIAGNOSIS — B961 Klebsiella pneumoniae [K. pneumoniae] as the cause of diseases classified elsewhere: Secondary | ICD-10-CM | POA: Diagnosis present

## 2019-03-13 DIAGNOSIS — E1165 Type 2 diabetes mellitus with hyperglycemia: Secondary | ICD-10-CM

## 2019-03-13 DIAGNOSIS — E876 Hypokalemia: Secondary | ICD-10-CM | POA: Diagnosis present

## 2019-03-13 DIAGNOSIS — Z8616 Personal history of COVID-19: Secondary | ICD-10-CM | POA: Diagnosis not present

## 2019-03-13 DIAGNOSIS — G40909 Epilepsy, unspecified, not intractable, without status epilepticus: Secondary | ICD-10-CM | POA: Diagnosis present

## 2019-03-13 DIAGNOSIS — K6811 Postprocedural retroperitoneal abscess: Secondary | ICD-10-CM | POA: Diagnosis present

## 2019-03-13 DIAGNOSIS — E785 Hyperlipidemia, unspecified: Secondary | ICD-10-CM | POA: Diagnosis present

## 2019-03-13 DIAGNOSIS — T8143XA Infection following a procedure, organ and space surgical site, initial encounter: Secondary | ICD-10-CM | POA: Diagnosis present

## 2019-03-13 DIAGNOSIS — R569 Unspecified convulsions: Secondary | ICD-10-CM

## 2019-03-13 DIAGNOSIS — Z87442 Personal history of urinary calculi: Secondary | ICD-10-CM | POA: Diagnosis not present

## 2019-03-13 DIAGNOSIS — E8889 Other specified metabolic disorders: Secondary | ICD-10-CM | POA: Diagnosis present

## 2019-03-13 DIAGNOSIS — N186 End stage renal disease: Secondary | ICD-10-CM | POA: Diagnosis present

## 2019-03-13 DIAGNOSIS — N2581 Secondary hyperparathyroidism of renal origin: Secondary | ICD-10-CM | POA: Diagnosis present

## 2019-03-13 HISTORY — DX: Other retroperitoneal abscess: K68.19

## 2019-03-13 LAB — RENAL FUNCTION PANEL
Albumin: 1.5 g/dL — ABNORMAL LOW (ref 3.5–5.0)
Anion gap: 12 (ref 5–15)
BUN: 43 mg/dL — ABNORMAL HIGH (ref 6–20)
CO2: 21 mmol/L — ABNORMAL LOW (ref 22–32)
Calcium: 7.8 mg/dL — ABNORMAL LOW (ref 8.9–10.3)
Chloride: 103 mmol/L (ref 98–111)
Creatinine, Ser: 5.91 mg/dL — ABNORMAL HIGH (ref 0.61–1.24)
GFR calc Af Amer: 12 mL/min — ABNORMAL LOW (ref >60)
GFR calc non Af Amer: 10 mL/min — ABNORMAL LOW (ref >60)
Glucose, Bld: 102 mg/dL — ABNORMAL HIGH (ref 70–99)
Phosphorus: 5.6 mg/dL — ABNORMAL HIGH (ref 2.5–4.6)
Potassium: 3.2 mmol/L — ABNORMAL LOW (ref 3.5–5.1)
Sodium: 136 mmol/L (ref 135–145)

## 2019-03-13 LAB — URINALYSIS, ROUTINE W REFLEX MICROSCOPIC
Bacteria, UA: NONE SEEN
Bilirubin Urine: NEGATIVE
Glucose, UA: NEGATIVE mg/dL
Hgb urine dipstick: NEGATIVE
Ketones, ur: NEGATIVE mg/dL
Nitrite: NEGATIVE
Protein, ur: 30 mg/dL — AB
Specific Gravity, Urine: 1.014 (ref 1.005–1.030)
pH: 6 (ref 5.0–8.0)

## 2019-03-13 LAB — GLUCOSE, CAPILLARY
Glucose-Capillary: 99 mg/dL (ref 70–99)
Glucose-Capillary: 84 mg/dL (ref 70–99)
Glucose-Capillary: 150 mg/dL — ABNORMAL HIGH (ref 70–99)

## 2019-03-13 LAB — APTT: aPTT: 39 seconds — ABNORMAL HIGH (ref 24–36)

## 2019-03-13 LAB — COMPREHENSIVE METABOLIC PANEL
ALT: 7 U/L (ref 0–44)
AST: 10 U/L — ABNORMAL LOW (ref 15–41)
Albumin: 1.5 g/dL — ABNORMAL LOW (ref 3.5–5.0)
Alkaline Phosphatase: 108 U/L (ref 38–126)
Anion gap: 16 — ABNORMAL HIGH (ref 5–15)
BUN: 41 mg/dL — ABNORMAL HIGH (ref 6–20)
CO2: 21 mmol/L — ABNORMAL LOW (ref 22–32)
Calcium: 7.5 mg/dL — ABNORMAL LOW (ref 8.9–10.3)
Chloride: 101 mmol/L (ref 98–111)
Creatinine, Ser: 5.85 mg/dL — ABNORMAL HIGH (ref 0.61–1.24)
GFR calc Af Amer: 12 mL/min — ABNORMAL LOW (ref >60)
GFR calc non Af Amer: 10 mL/min — ABNORMAL LOW (ref >60)
Glucose, Bld: 81 mg/dL (ref 70–99)
Potassium: 2.7 mmol/L — CL (ref 3.5–5.1)
Sodium: 138 mmol/L (ref 135–145)
Total Bilirubin: 0.4 mg/dL (ref 0.3–1.2)
Total Protein: 5.6 g/dL — ABNORMAL LOW (ref 6.5–8.1)

## 2019-03-13 LAB — CBC
HCT: 25.3 % — ABNORMAL LOW (ref 39.0–52.0)
Hemoglobin: 8.2 g/dL — ABNORMAL LOW (ref 13.0–17.0)
MCH: 30.1 pg (ref 26.0–34.0)
MCHC: 32.4 g/dL (ref 30.0–36.0)
MCV: 93 fL (ref 80.0–100.0)
Platelets: 216 10*3/uL (ref 150–400)
RBC: 2.72 MIL/uL — ABNORMAL LOW (ref 4.22–5.81)
RDW: 16.5 % — ABNORMAL HIGH (ref 11.5–15.5)
WBC: 8.5 10*3/uL (ref 4.0–10.5)
nRBC: 0 % (ref 0.0–0.2)

## 2019-03-13 LAB — HEMOGLOBIN A1C
Hgb A1c MFr Bld: 6.5 % — ABNORMAL HIGH (ref 4.8–5.6)
Mean Plasma Glucose: 139.85 mg/dL

## 2019-03-13 LAB — PROTIME-INR
INR: 1.7 — ABNORMAL HIGH (ref 0.8–1.2)
Prothrombin Time: 20 seconds — ABNORMAL HIGH (ref 11.4–15.2)

## 2019-03-13 LAB — CBG MONITORING, ED
Glucose-Capillary: 68 mg/dL — ABNORMAL LOW (ref 70–99)
Glucose-Capillary: 95 mg/dL (ref 70–99)
Glucose-Capillary: 79 mg/dL (ref 70–99)

## 2019-03-13 LAB — MAGNESIUM: Magnesium: 1.8 mg/dL (ref 1.7–2.4)

## 2019-03-13 LAB — HEPATITIS B SURFACE ANTIGEN: Hepatitis B Surface Ag: NONREACTIVE

## 2019-03-13 LAB — LACTIC ACID, PLASMA: Lactic Acid, Venous: 0.8 mmol/L (ref 0.5–1.9)

## 2019-03-13 MED ORDER — SIMVASTATIN 20 MG PO TABS
20.0000 mg | ORAL_TABLET | Freq: Every day | ORAL | Status: DC
  Administered 2019-03-13 – 2019-03-23 (×10): 20 mg via ORAL
  Filled 2019-03-13 (×10): qty 1

## 2019-03-13 MED ORDER — FERROUS SULFATE 325 (65 FE) MG PO TABS
325.0000 mg | ORAL_TABLET | Freq: Every day | ORAL | Status: DC
  Administered 2019-03-14 – 2019-03-23 (×8): 325 mg via ORAL
  Filled 2019-03-13 (×8): qty 1

## 2019-03-13 MED ORDER — POTASSIUM CHLORIDE CRYS ER 20 MEQ PO TBCR
40.0000 meq | EXTENDED_RELEASE_TABLET | Freq: Once | ORAL | Status: AC
  Administered 2019-03-13: 40 meq via ORAL
  Filled 2019-03-13: qty 4

## 2019-03-13 MED ORDER — TAMSULOSIN HCL 0.4 MG PO CAPS
0.4000 mg | ORAL_CAPSULE | Freq: Every day | ORAL | Status: DC
  Administered 2019-03-13 – 2019-03-23 (×10): 0.4 mg via ORAL
  Filled 2019-03-13 (×10): qty 1

## 2019-03-13 MED ORDER — INSULIN ASPART 100 UNIT/ML ~~LOC~~ SOLN
0.0000 [IU] | SUBCUTANEOUS | Status: DC
  Administered 2019-03-13: 1 [IU] via SUBCUTANEOUS
  Administered 2019-03-14: 2 [IU] via SUBCUTANEOUS
  Administered 2019-03-14: 3 [IU] via SUBCUTANEOUS
  Administered 2019-03-14 (×2): 2 [IU] via SUBCUTANEOUS
  Administered 2019-03-14: 5 [IU] via SUBCUTANEOUS
  Administered 2019-03-15 (×2): 3 [IU] via SUBCUTANEOUS
  Administered 2019-03-15: 2 [IU] via SUBCUTANEOUS
  Administered 2019-03-15 – 2019-03-16 (×2): 5 [IU] via SUBCUTANEOUS
  Administered 2019-03-16: 3 [IU] via SUBCUTANEOUS
  Administered 2019-03-16: 9 [IU] via SUBCUTANEOUS
  Administered 2019-03-16: 2 [IU] via SUBCUTANEOUS
  Administered 2019-03-16: 5 [IU] via SUBCUTANEOUS
  Administered 2019-03-17: 21:00:00 2 [IU] via SUBCUTANEOUS
  Administered 2019-03-17: 3 [IU] via SUBCUTANEOUS
  Administered 2019-03-17: 2 [IU] via SUBCUTANEOUS
  Administered 2019-03-17 (×2): 3 [IU] via SUBCUTANEOUS
  Administered 2019-03-17 – 2019-03-18 (×2): 5 [IU] via SUBCUTANEOUS
  Administered 2019-03-18: 2 [IU] via SUBCUTANEOUS
  Administered 2019-03-18: 5 [IU] via SUBCUTANEOUS
  Administered 2019-03-18 (×2): 2 [IU] via SUBCUTANEOUS
  Administered 2019-03-18: 1 [IU] via SUBCUTANEOUS
  Administered 2019-03-18 – 2019-03-19 (×3): 3 [IU] via SUBCUTANEOUS
  Administered 2019-03-19: 1 [IU] via SUBCUTANEOUS
  Administered 2019-03-19 – 2019-03-20 (×2): 2 [IU] via SUBCUTANEOUS
  Administered 2019-03-20: 18:00:00 9 [IU] via SUBCUTANEOUS

## 2019-03-13 MED ORDER — ADULT MULTIVITAMIN W/MINERALS CH
1.0000 | ORAL_TABLET | Freq: Every day | ORAL | Status: DC
  Administered 2019-03-13 – 2019-03-23 (×10): 1 via ORAL
  Filled 2019-03-13 (×10): qty 1

## 2019-03-13 MED ORDER — ONDANSETRON HCL 4 MG PO TABS: 4.0000 mg | ORAL_TABLET | Freq: Four times a day (QID) | ORAL | Status: DC | PRN

## 2019-03-13 MED ORDER — FLUOXETINE HCL 20 MG PO CAPS
20.0000 mg | ORAL_CAPSULE | Freq: Every day | ORAL | Status: DC
  Administered 2019-03-13 – 2019-03-23 (×10): 20 mg via ORAL
  Filled 2019-03-13 (×12): qty 1

## 2019-03-13 MED ORDER — ONDANSETRON HCL 4 MG/2ML IJ SOLN
4.0000 mg | Freq: Four times a day (QID) | INTRAMUSCULAR | Status: DC | PRN
  Administered 2019-03-13 – 2019-03-19 (×2): 4 mg via INTRAVENOUS
  Filled 2019-03-13 (×2): qty 2

## 2019-03-13 MED ORDER — CLONIDINE HCL 0.1 MG PO TABS: 0.1000 mg | ORAL_TABLET | Freq: Two times a day (BID) | ORAL | Status: DC | PRN

## 2019-03-13 MED ORDER — CHLORHEXIDINE GLUCONATE CLOTH 2 % EX PADS
6.0000 | MEDICATED_PAD | Freq: Every day | CUTANEOUS | Status: DC
  Administered 2019-03-13 – 2019-03-23 (×11): 6 via TOPICAL

## 2019-03-13 MED ORDER — LACTULOSE 10 GM/15ML PO SOLN
20.0000 g | Freq: Two times a day (BID) | ORAL | Status: DC
  Administered 2019-03-13 – 2019-03-21 (×6): 20 g via ORAL
  Filled 2019-03-13 (×17): qty 30

## 2019-03-13 MED ORDER — ACETAMINOPHEN 325 MG PO TABS
650.0000 mg | ORAL_TABLET | Freq: Four times a day (QID) | ORAL | Status: DC | PRN
  Administered 2019-03-13 – 2019-03-16 (×4): 650 mg via ORAL
  Filled 2019-03-13 (×5): qty 2

## 2019-03-13 MED ORDER — CARBAMAZEPINE 200 MG PO TABS
900.0000 mg | ORAL_TABLET | Freq: Two times a day (BID) | ORAL | Status: DC
  Administered 2019-03-13 – 2019-03-23 (×20): 900 mg via ORAL
  Filled 2019-03-13 (×23): qty 4.5

## 2019-03-13 MED ORDER — CARBAMAZEPINE 200 MG PO TABS: 800.0000 mg | ORAL_TABLET | Freq: Two times a day (BID) | ORAL | Status: DC

## 2019-03-13 MED ORDER — DEXTROSE 50 % IV SOLN
12.5000 g | INTRAVENOUS | Status: AC
  Administered 2019-03-13: 12.5 g via INTRAVENOUS

## 2019-03-13 MED ORDER — LACTULOSE 10 GM/15ML PO SOLN: 30.0000 g | Freq: Two times a day (BID) | ORAL | Status: DC

## 2019-03-13 MED ORDER — PHENOBARBITAL 97.2 MG PO TABS
259.2000 mg | ORAL_TABLET | Freq: Every day | ORAL | Status: DC
  Administered 2019-03-13 – 2019-03-16 (×4): 259.2 mg via ORAL
  Administered 2019-03-17 – 2019-03-23 (×6): 243 mg via ORAL
  Filled 2019-03-13: qty 8
  Filled 2019-03-13 (×2): qty 3
  Filled 2019-03-13: qty 8
  Filled 2019-03-13 (×3): qty 3
  Filled 2019-03-13: qty 8
  Filled 2019-03-13: qty 3
  Filled 2019-03-13: qty 8

## 2019-03-13 MED ORDER — ACETAMINOPHEN 650 MG RE SUPP: 650.0000 mg | Freq: Four times a day (QID) | RECTAL | Status: DC | PRN

## 2019-03-13 MED ORDER — ALLOPURINOL 100 MG PO TABS
100.0000 mg | ORAL_TABLET | Freq: Every day | ORAL | Status: DC
  Administered 2019-03-14 – 2019-03-23 (×9): 100 mg via ORAL
  Filled 2019-03-13 (×11): qty 1

## 2019-03-13 MED ORDER — CARBAMAZEPINE 200 MG PO TABS: 900.0000 mg | ORAL_TABLET | Freq: Two times a day (BID) | ORAL | Status: DC

## 2019-03-13 MED ORDER — SODIUM BICARBONATE 650 MG PO TABS
650.0000 mg | ORAL_TABLET | Freq: Three times a day (TID) | ORAL | Status: DC
  Administered 2019-03-13 – 2019-03-14 (×2): 650 mg via ORAL
  Filled 2019-03-13 (×3): qty 1

## 2019-03-13 MED ORDER — PANTOPRAZOLE SODIUM 40 MG PO TBEC
40.0000 mg | DELAYED_RELEASE_TABLET | Freq: Every day | ORAL | Status: DC
  Administered 2019-03-14 – 2019-03-23 (×8): 40 mg via ORAL
  Filled 2019-03-13 (×9): qty 1

## 2019-03-13 MED ORDER — PHENOBARBITAL 64.8 MG PO TABS
259.2000 mg | ORAL_TABLET | Freq: Once | ORAL | Status: AC
  Administered 2019-03-13: 259.2 mg via ORAL
  Filled 2019-03-13: qty 4

## 2019-03-13 MED ORDER — CALCIUM ACETATE (PHOS BINDER) 667 MG PO CAPS
2001.0000 mg | ORAL_CAPSULE | Freq: Three times a day (TID) | ORAL | Status: DC
  Administered 2019-03-16: 2001 mg via ORAL
  Filled 2019-03-13 (×5): qty 3

## 2019-03-13 NOTE — Progress Notes (Signed)
Pharmacy Antibiotic Note  Trelon Plush is a 49 y.o. male admitted on 03/12/2019 with sepsis, hx of ESBL Kleb pneumo in UCx 11/28/18 - sens to unasyn/merrem//zosyn, however with concern for sepsis and hx of blood stream infections will go with Merrem for now.   ESRD-HD, usually MWF Pharmacy has been consulted for Merrem dosing.  Plan: Continue meropenem 500 mg IV every 24 hours F/u ability to narrow *Pharmacy will sign off as no dose adjustments are anticipated. Thank you for the consult!  Height: 6' 2"  (188 cm) Weight: 200 lb (90.7 kg) IBW/kg (Calculated) : 82.2  Temp (24hrs), Avg:100.1 F (37.8 C), Min:98.5 F (36.9 C), Max:102.8 F (39.3 C)  Recent Labs  Lab 03/12/19 2051 03/12/19 2331 03/13/19 0423  WBC  --  8.5 8.5  CREATININE 5.72*  --  5.85*  LATICACIDVEN 0.8  --   --     Estimated Creatinine Clearance: 18 mL/min (A) (by C-G formula based on SCr of 5.85 mg/dL (H)).    Allergies  Allergen Reactions  . Fish Oil Swelling and Other (See Comments)        Salome Arnt, PharmD, BCPS Clinical Pharmacist Please see AMION for all pharmacy numbers 03/13/2019 8:04 AM

## 2019-03-13 NOTE — NC FL2 (Signed)
[place] LEVEL OF CARE SCREENING TOOL     IDENTIFICATION  Patient Name: Eddie Davies Birthdate: [birth year] Sex: male Admission Date (Current Location): 03/12/2019  [facility] [place] and [place] Number:  Herbalist and Address:  [facility] [place], [place] [address], [place], [place] [address]      Provider Number: [id number]  Attending Physician Name and Address:  Harold Hedge, MD  Relative Name and Phone Number:       Current Level of Care: Hospital Recommended Level of Care: [place] Prior Approval Number:    Date Approved/Denied:   PASRR Number: [id number]  Discharge Plan: SNF    Current Diagnoses: Patient Active Problem List   Diagnosis Date Noted  . Retroperitoneal abscess ([place]) 03/13/2019  . Infection due to ESBL-producing Klebsiella pneumoniae 03/13/2019  . Bacteremia due to Klebsiella pneumoniae 06/19/2018  . Fever 06/18/2018  . ESRD (end stage renal disease) on dialysis ([place]) 06/18/2018  . Anemia due to chronic kidney disease 06/18/2018  . Leukocytosis 06/18/2018  . Acute metabolic encephalopathy 62/22/9798  . Seizures ([place]) 05/24/2018  . BPH (benign prostatic hyperplasia) 05/24/2018  . Sepsis ([place]) 05/24/2018  . Benign essential HTN 05/24/2018  . HLD (hyperlipidemia) 05/24/2018  . Diabetes mellitus type 2, uncontrolled, with complications ([place]) 92/11/9415  . Thrombocytopenia ([place]) 05/24/2018  . Confusion   . Metabolic acidosis 40/81/4481  . Respiratory failure ([place])   . Acute renal failure superimposed on stage 4 chronic kidney disease ([place])   . End stage liver disease ([place])   . Pressure injury of skin 04/21/2018  . Altered mental status, unspecified 04/21/2018  . HCAP (healthcare-associated pneumonia) 04/21/2018  . Acute renal failure (ARF) ([place]) 04/07/2018    Orientation RESPIRATION BLADDER Height & Weight     Self, Time, Situation, Place  Normal Incontinent, External catheter Weight:  200 lb (90.7 kg) Height:  6' 2"  (188 cm)  BEHAVIORAL SYMPTOMS/MOOD NEUROLOGICAL BOWEL NUTRITION STATUS      Incontinent Diet(see discharge summary)  AMBULATORY STATUS COMMUNICATION OF NEEDS Skin   Extensive Assist Verbally PU Stage and Appropriate Care, Other (Comment)(cracking/dry; stage 1 PU)                       Personal Care Assistance Level of Assistance  Feeding, Dressing, Bathing Bathing Assistance: Maximum assistance Feeding assistance: Independent Dressing Assistance: Maximum assistance     Functional Limitations Info  Sight, Hearing, Speech Sight Info: Adequate Hearing Info: Adequate Speech Info: Adequate    SPECIAL CARE FACTORS FREQUENCY                       Contractures Contractures Info: Not present    Additional Factors Info  Code Status, Allergies, Insulin Sliding Scale, Isolation Precautions, Psychotropic Code Status Info: Full Code Allergies Info: fish oil Psychotropic Info: FLUoxetine (PROZAC) capsule 20 mg daily PO; Insulin Sliding Scale Info: insulin aspart (novoLOG) injection 0-9 Units every 4 hours Isolation Precautions Info: contact for ESBL     Current Medications (03/13/2019):  This is the current hospital active medication list Current Facility-Administered Medications  Medication Dose Route Frequency Provider Last Rate Last Admin  . acetaminophen (TYLENOL) tablet 650 mg  650 mg Oral Q6H PRN Etta Quill, DO       Or  . acetaminophen (TYLENOL) suppository 650 mg  650 mg Rectal Q6H PRN Etta Quill, DO      . allopurinol (ZYLOPRIM) tablet 100 mg  100  mg Oral Daily Jennette Kettle M, DO      . calcium acetate (PHOSLO) capsule 2,001 mg  2,001 mg Oral TID WC Etta Quill, DO      . carbamazepine (TEGRETOL) tablet 900 mg  900 mg Oral BID Jennette Kettle M, DO   900 mg at 03/13/19 1042  . Chlorhexidine Gluconate Cloth 2 % PADS 6 each  6 each Topical Q0600 Loren Racer, [name]   6 each at 03/13/19 1043  . ferrous sulfate  tablet 325 mg  325 mg Oral Q breakfast Jennette Kettle M, DO      . FLUoxetine (PROZAC) capsule 20 mg  20 mg Oral Daily Jennette Kettle M, DO   20 mg at 03/13/19 1042  . insulin aspart (novoLOG) injection 0-9 Units  0-9 Units Subcutaneous Q4H Alcario Drought, Jared M, DO      . lactulose (CHRONULAC) 10 GM/15ML solution 20 g  20 g Oral BID Jennette Kettle M, DO      . meropenem (MERREM) 500 mg in sodium chloride 0.9 % 100 mL IVPB  500 mg Intravenous Q24H Bertis Ruddy, RPH      . multivitamin with minerals tablet 1 tablet  1 tablet Oral QHS Jennette Kettle M, DO      . ondansetron [facility]) tablet 4 mg  4 mg Oral Q6H PRN Etta Quill, DO       Or  . ondansetron [facility]) injection 4 mg  4 mg Intravenous Q6H PRN Etta Quill, DO      . pantoprazole (PROTONIX) EC tablet 40 mg  40 mg Oral Daily Jennette Kettle M, DO      . PHENobarbital (LUMINAL) tablet 259.2 mg  259.2 mg Oral QHS Jennette Kettle M, DO      . simvastatin (ZOCOR) tablet 20 mg  20 mg Oral QHS Jennette Kettle M, DO      . sodium bicarbonate tablet 650 mg  650 mg Oral TID Etta Quill, DO      . tamsulosin [facility]) capsule 0.4 mg  0.4 mg Oral QHS Etta Quill, DO         Discharge Medications: Please see discharge summary for a list of discharge medications.  Relevant Imaging Results:  Relevant Lab Results:   Additional Information SSN: [id number]  [place]

## 2019-03-13 NOTE — H&P (Signed)
Chief Complaint: Nausea / Vomiting  Referring Physician(s): Dr. Cassandria Santee  Supervising Physician: Arne Cleveland  Patient Status: Eddie Davies [facility] - In-pt  History of Present Illness: Eddie Davies is a 49 y.o. male History of ESRD on HD recently admitted for left sided pyelonephritis, sepsis s/p left sided radical nephrectomy and right double J stent placement at OSH. Presented to [facility] ED with nausea, vomiting fever found to have a left retroperitoneal collection. Team is requesting abscess drain placement to the left retroperitoneal abscess.   Past Medical History:  Diagnosis Date  . Cirrhosis ([place]) 04/2018  . Diabetes mellitus without complication (HCC)    type II  . ESRD (end stage renal disease) ([place])    MWF   . Hypertension   . Liver failure ([place])   . Metabolic acidosis 99/8338  . Pneumonia     Past Surgical History:  Procedure Laterality Date  . AV FISTULA PLACEMENT Right 10/23/2018   Procedure: ARTERIOVENOUS (AV) FISTULA CREATION RIGHT ARM;  Surgeon: [name], MD;  Location: [place];  Service: Vascular;  Laterality: Right;  . CHOLECYSTECTOMY    . IR FLUORO GUIDE CV LINE RIGHT  05/30/2018  . IR US GUIDE VASC ACCESS RIGHT  05/30/2018    Allergies: Fish oil  Medications: Prior to Admission medications   Medication Sig Start Date End Date Taking? Authorizing Provider  acetaminophen (TYLENOL) 325 MG tablet Take 650 mg by mouth every 8 (eight) hours as needed for fever (pain).   Yes [provider]  allopurinol (ZYLOPRIM) 100 MG tablet Take 100 mg by mouth daily.  08/01/18  Yes [provider]  calcium acetate (PHOSLO) 667 MG capsule Take 2,001 mg by mouth 3 (three) times daily with meals.    Yes [provider]  carbamazepine (CARBATROL) 100 MG 12 hr capsule Take 100 mg by mouth 2 (two) times daily. 02/23/19  Yes [provider]  carbamazepine (TEGRETOL XR) 400 MG 12 hr tablet Take 800 mg by mouth 2 (two) times daily.    Yes [provider]  cloNIDine (CATAPRES) 0.1 MG tablet Take 0.1 mg by mouth 2 (two) times daily as needed (SBP ABOVE 170).  07/25/18  Yes [provider]  DULoxetine (CYMBALTA) 20 MG capsule Take 20 mg by mouth daily.   Yes [provider]  ferrous sulfate 325 (65 FE) MG tablet Take 325 mg by mouth daily with breakfast.   Yes [provider]  guaiFENesin (MUCINEX) 600 MG 12 hr tablet Take 600 mg by mouth every 12 (twelve) hours as needed for cough (congestion).   Yes [provider]  insulin aspart (NOVOLOG) 100 UNIT/ML injection Inject 0-15 Units into the skin 4 (four) times daily -  with meals and at bedtime. 70-120=0 units, 121-150=2 units, 151-200=3 units, 201-250=5 units, 251-300=8 units, 301-350=11 units, 351-400=15 units, >400 call MD   Yes [provider]  Insulin Glargine (BASAGLAR KWIKPEN) 100 UNIT/ML SOPN Inject 0.28 mLs (28 Units total) into the skin at bedtime. Patient taking differently: Inject 20 Units into the skin at bedtime.  12/05/18 03/13/19 Yes [place] Eric J, DO  Ipratropium-Albuterol (COMBIVENT) 20-100 MCG/ACT AERS respimat Inhale 1 puff into the lungs every 6 (six) hours as needed for wheezing.   Yes [provider]  lactulose (CHRONULAC) 10 GM/15ML solution Take 15 mLs (10 g total) by mouth 2 (two) times daily. Patient taking differently: Take 30 g by mouth 2 (two) times daily.  05/31/18  Yes [name], [name]  K, MD  lidocaine-prilocaine (EMLA) cream Apply 1 application topically every Monday, Wednesday, and Friday.   Yes [provider]  Multiple Vitamin (MULTIVITAMIN WITH MINERALS) TABS tablet Take 1 tablet by mouth at bedtime.   Yes [provider]  omeprazole (PRILOSEC) 20 MG capsule Take 20 mg by mouth daily.  07/04/18  Yes [provider]  ondansetron (ZOFRAN) 4 MG tablet Take 4 mg by mouth every 8 (eight) hours as needed for nausea or vomiting.  08/03/18  Yes [provider]    PHENobarbital (LUMINAL) 64.8 MG tablet Take 4 tablets (259.2 mg total) by mouth at bedtime. 12/05/18 03/13/19 Yes [place] Eric J, DO  PROTEIN PO Take 30 mLs by mouth in the morning and at bedtime.   Yes [provider]  simvastatin (ZOCOR) 20 MG tablet Take 20 mg by mouth at bedtime.    Yes [provider]  sodium bicarbonate 650 MG tablet Take 650 mg by mouth 3 (three) times daily.   Yes [provider]  tamsulosin (FLOMAX) 0.4 MG CAPS capsule Take 0.4 mg by mouth at bedtime.   Yes [provider]  Darbepoetin Alfa (ARANESP) 200 MCG/0.4ML SOSY injection Inject 0.4 mLs (200 mcg total) into the vein every Thursday with hemodialysis. 06/28/18   Amin, Jeanella Flattery, MD  HYDROcodone-acetaminophen (NORCO) 5-325 MG tablet Take 1 tablet by mouth every 6 (six) hours as needed for moderate pain. 12/05/18   [place] Eric J, DO     History reviewed. No pertinent family history.  Social History   Socioeconomic History  . Marital status: Single    Spouse name: Not on file  . Number of children: Not on file  . Years of education: Not on file  . Highest education level: Not on file  Occupational History  . Not on file  Tobacco Use  . Smoking status: Current Some Day Smoker    Packs/day: 0.25    Types: Cigarettes  . Smokeless tobacco: Never Used  Substance and Sexual Activity  . Alcohol use: Not Currently  . Drug use: Never  . Sexual activity: Not on file  Other Topics Concern  . Not on file  Social History Narrative  . Not on file   Social Determinants of Health   Financial Resource Strain:   . Difficulty of Paying Living Expenses: Not on file  Food Insecurity:   . Worried About Charity fundraiser in the Last Year: Not on file  . Ran Out of Food in the Last Year: Not on file  Transportation Needs:   . Lack of Transportation (Medical): Not on file  . Lack of Transportation (Non-Medical): Not on file  Physical Activity:   . Days of Exercise per Week:  Not on file  . Minutes of Exercise per Session: Not on file  Stress:   . Feeling of Stress : Not on file  Social Connections:   . Frequency of Communication with Friends and Family: Not on file  . Frequency of Social Gatherings with Friends and Family: Not on file  . Attends Religious Services: Not on file  . Active Member of Clubs or Organizations: Not on file  . Attends Archivist Meetings: Not on file  . Marital Status: Not on file   Review of Systems: A 12 point ROS discussed and pertinent positives are indicated in the HPI above.  All other systems are negative.  Review of Systems  Constitutional: Negative for fever.  HENT: Negative for congestion.   Respiratory:  Negative for cough and shortness of breath.   Cardiovascular: Negative for chest pain.  Gastrointestinal: Negative for abdominal pain.  Genitourinary: Positive for flank pain ( left).  Neurological: Negative for headaches.  Psychiatric/Behavioral: Negative for behavioral problems and confusion.    Vital Signs: BP (!) 142/82 (BP Location: Left Arm)   Pulse 80   Temp 98.5 F (36.9 C) (Oral)   Resp 19   Ht 6' 2"  (1.88 m)   Wt 200 lb (90.7 kg)   SpO2 100%   BMI 25.68 kg/m   Physical Exam Vitals and nursing note reviewed.  Constitutional:      Appearance: He is well-developed.  HENT:     Head: Normocephalic.  Cardiovascular:     Rate and Rhythm: Normal rate and regular rhythm.  Pulmonary:     Effort: Pulmonary effort is normal.     Breath sounds: Normal breath sounds.  Musculoskeletal:        General: Normal range of motion.     Cervical back: Normal range of motion.  Skin:    General: Skin is dry.  Neurological:     Mental Status: He is alert and oriented to person, place, and time.     Imaging: CT ABDOMEN PELVIS W CONTRAST  Result Date: 03/12/2019 CLINICAL DATA:  Pyelonephritis, sepsis, renal stent, left nephrectomy EXAM: CT ABDOMEN AND PELVIS WITH CONTRAST TECHNIQUE: Multidetector CT  imaging of the abdomen and pelvis was performed using the standard protocol following bolus administration of intravenous contrast. CONTRAST:  136m OMNIPAQUE IOHEXOL 300 MG/ML  SOLN COMPARISON:  02/04/2019 FINDINGS: Lower chest: Small bilateral pleural effusions. Coronary artery calcifications and/or stents. Hepatobiliary: No focal liver abnormality is seen. Status post cholecystectomy. No biliary dilatation. Pancreas: Atrophic pancreas with parenchymal calcifications, in keeping with chronic stigmata of pancreatitis. No pancreatic ductal dilatation or surrounding inflammatory changes. Spleen: Splenomegaly. Adrenals/Urinary Tract: Adrenal glands are unremarkable. Redemonstrated postoperative findings of left nephrectomy. There has been interval enlargement of a left retroperitoneal air and fluid collection involving the left psoas, now measuring at least 17.4 x 5.9 cm (series 3, image 53). A previously seen right-sided double-J ureteral stent is been removed. A 1.3 cm calculus remains in the right renal pelvis with additional small right-sided calyceal calculi. Bladder is unremarkable. Stomach/Bowel: Stomach is within normal limits. No evidence of bowel wall thickening, distention, or inflammatory changes. Large stool balls in the distal sigmoid and rectum. Vascular/Lymphatic: No significant vascular findings are present. Multiple prominent retroperitoneal lymph nodes similar to prior examination. Reproductive: No mass or other significant abnormality. Other: Anasarca. No abdominopelvic ascites. Musculoskeletal: No acute or significant osseous findings. Intramedullary nail fixation of the left femoral neck. IMPRESSION: 1. Status post left nephrectomy. Interval enlargement of left retroperitoneal air and fluid collection involving the left psoas, now measuring at least 17.4 x 5.9 cm and consistent with worsened abscess. 2. Interval removal of right-sided double-J ureteral stent. A 1.3 cm calculus remains in the  right renal pelvis with additional small right-sided calyceal calculi. No hydronephrosis. 3. Large stool balls in the distal sigmoid and rectum. 4. Small bilateral pleural effusions. 5. Anasarca. 6. Splenomegaly. 7. Calcific stigmata of chronic pancreatitis. Electronically Signed   By: AEddie CandleM.D.   On: 03/12/2019 23:19   DG Chest Port 1 View  Result Date: 03/12/2019 CLINICAL DATA:  49year old male with chest pain shortness of breath fever nausea vomiting. EXAM: PORTABLE CHEST 1 VIEW COMPARISON:  Portable chest 12/25/2018 and earlier. FINDINGS: Portable AP semi upright view at 2234 hours. Lower  lung volumes. Mediastinal contours remain within normal limits. Stable dual lumen right chest dialysis type catheter. Visualized tracheal air column is within normal limits. Crowding of lung markings. No pneumothorax, pleural effusion or consolidation. Increased pulmonary vascular congestion but no overt edema. No acute osseous abnormality identified. IMPRESSION: Lower lung volumes and increased pulmonary vascular congestion since December. No other acute cardiopulmonary abnormality. Electronically Signed   By: Genevie Ann M.D.   On: 03/12/2019 22:41    Labs:  CBC: Recent Labs    11/30/18 0500 11/30/18 0500 12/03/18 1232 12/04/18 0654 03/12/19 2331 03/13/19 0423  WBC 10.9*  --  9.4  --  8.5 8.5  HGB 7.3*   < > 6.9* 8.6* 8.3* 8.2*  HCT 21.7*   < > 21.1* 26.5* 25.9* 25.3*  PLT 126*  --  177  --  228 216   < > = values in this interval not displayed.    COAGS: Recent Labs    05/19/18 1339 05/19/18 1339 05/21/18 0419 05/29/18 0500 06/18/18 1319 03/12/19 2331  INR 1.6*   < > 1.7* 1.3* 1.9* 1.7*  APTT 47*  --   --   --   --  39*   < > = values in this interval not displayed.    BMP: Recent Labs    12/03/18 0947 12/04/18 1414 03/12/19 2051 03/13/19 0423  NA 132* 137 140 138  K 3.4* 4.3 2.6* 2.7*  CL 97* 101 104 101  CO2 20* 22 23 21*  GLUCOSE 314* 302* 59* 81  BUN 48* 31* 40* 41*   CALCIUM 7.8* 7.9* 7.7* 7.5*  CREATININE 8.62* 6.27* 5.72* 5.85*  GFRNONAA 7* 10* 11* 10*  GFRAA 8* 11* 12* 12*    LIVER FUNCTION TESTS: Recent Labs    10/23/18 1043 10/23/18 1043 11/27/18 2019 11/27/18 2019 12/03/18 0947 12/04/18 1414 03/12/19 2051 03/13/19 0423  BILITOT 0.4  --  0.7  --   --   --  0.8 0.4  AST 22  --  17  --   --   --  10* 10*  ALT 20  --  14  --   --   --  8 7  ALKPHOS 145*  --  125  --   --   --  124 108  PROT 7.3  --  6.5  --   --   --  6.6 5.6*  ALBUMIN 3.1*   < > 2.7*   < > 2.0* 2.0* 1.8* 1.5*   < > = values in this interval not displayed.    TUMOR MARKERS: No results for input(s): AFPTM, CEA, CA199, CHROMGRNA in the last 8760 hours.  Assessment and Plan:  49 y.o, male inpatient. History of ESRD on HD recently admitted for left sided pyelonephritis, sepsis s/p left sided radical nephrectomy and right double J stent placement at OSH. Presented to [facility] ED with nausea and vomiting found to have a left retroperitoneal collection in the post surgical bed. Team is requesting abscess drain placement to the left retroperitoneal abscess.  Pertinent Imaging 2.16.21 -CT abd pelvis reads Status post left nephrectomy. Interval enlargement of left retroperitoneal air and fluid collection involving the left psoas, now measuring at least 17.4 x 5.9 cm and consistent with worsened abscess.  Pertinent IR History 5.6.20 - RIJ perm cath placement  Pertinent Allergies none  Potassium 2.7 all other labs and medications are within acceptable parameters. Blood cultures pending  Patient is afebrile on last temperature check .  Risks  and benefits discussed with the patient including bleeding, infection, damage to adjacent structures, bowel perforation/fistula connection, and sepsis.  All of the patient's questions were answered, patient is agreeable to proceed.  Consent signed and in chart.    Thank you for this interesting consult.  I greatly enjoyed meeting  Higinio Grow and look forward to participating in their care.  A copy of this report was sent to the requesting provider on this date.  Electronically Signed: Avel Peace, NP 03/13/2019, 10:38 AM   I spent a total of 40 Minutes    in face to face in clinical consultation, greater than 50% of which was counseling/coordinating care for retroperitoneal abscess drain placement

## 2019-03-13 NOTE — Progress Notes (Signed)
Received report from Raina Mina, RN, awaiting patient's arrival to unit.

## 2019-03-13 NOTE — ED Notes (Signed)
Date and time results received: 03/13/19 05:30  Test: potassium Critical Value: 2.7  Name of Provider Notified: Jennette Kettle DO  Orders Received? Or Actions Taken?: Orders Received - See Orders for details

## 2019-03-13 NOTE — Consult Note (Signed)
Reason for Consult: Left Retroperitoneal Abscess, End-Stage Renal Disease, Urolithiasis,   Referring Physician: Noemi Chapel MD  Eddie Davies is an 49 y.o. male.   HPI:   1 - Left Retroperitoneal Abscess - 17cm mixed fluid, gas retroperitoneal abscess / fluid collection by CT 02/2019 on eval fevers/malise/flank pain. He is s/p left open flank nephrectomy at [facility]12/2020 for infected kidney complicated by recurrent fluid collections. Had Drain in place at [facility] 01/2019 and subsequently removed. WBC 8.5. Fevers to 103. Prior UCX 11/2018 Klebsiella sens zosyn, imipenem.   2 -  End-Stage Renal Disease - on hemnodialysis via Rt SVC cath for medical renal disease.   3 -  Urolithiasis - chronic non-obstructing Rt 38m renal pelvis stone on imaging x many, stable by CT 02/2019  PMH sig for cirrhosis, DM2 (A1c 6's), appy, hernia repair. [place] " [place]" is seen in consultation for large left retroperitoneal abnscess. His albumin is 1.8 and ammonia 60.    Past Medical History:  Diagnosis Date  . Cirrhosis ([facility] 04/2018  . Diabetes mellitus without complication (HCC)    type II  . [place] ([place] stage [place] disease) ([place]    MWF   . Hypertension   . Liver failure ([place]   . Metabolic acidosis 070/9628 . Pneumonia     Past Surgical History:  Procedure Laterality Date  . AV FISTULA PLACEMENT Right 10/23/2018   Procedure: ARTERIOVENOUS (AV) FISTULA CREATION RIGHT ARM;  Surgeon: BSerafina Mitchell MD;  [facility]  Service: Vascular;  Laterality: Right;  . CHOLECYSTECTOMY    . IR FLUORO GUIDE CV LINE RIGHT  05/30/2018  . IR [place] VASC ACCESS RIGHT  05/30/2018    History reviewed. No pertinent family history.  Social History:  reports that he has been smoking cigarettes. He has been smoking about 0.25 packs per day. He has never used smokeless tobacco. He reports previous alcohol use. He reports that he does not use drugs.  Allergies:  Allergies  Allergen  Reactions  . Fish Oil Swelling and Other (See Comments)         Medications: I have reviewed the patient's current medications.  Results for orders placed or performed during the hospital encounter of 03/12/19 (from the past 48 hour(s))  Lactic acid, plasma     Status: None   Collection Time: 03/12/19  8:51 PM  Result Value Ref Range   Lactic Acid, Venous 0.8 0.5 - 1.9 mmol/L    Comment: Performed at [facility] [address] [place] [address] Comprehensive metabolic panel     Status: Abnormal   Collection Time: 03/12/19  8:51 PM  Result Value Ref Range   Sodium 140 135 - 145 mmol/L   Potassium 2.6 (LL) 3.5 - 5.1 mmol/L    Comment: CRITICAL RESULT CALLED TO, READ BACK BY AND VERIFIED WITH: BECK B,RN 03/12/19 2227 WAYK    Chloride 104 98 - 111 mmol/L   CO2 23 22 - 32 mmol/L   Glucose, Bld 59 (L) 70 - 99 mg/dL   BUN 40 (H) 6 - 20 mg/dL   Creatinine, Ser 5.72 (H) 0.61 - 1.24 mg/dL   Calcium 7.7 (L) 8.9 - 10.3 mg/dL   Total Protein 6.6 6.5 - 8.1 g/dL   Albumin 1.8 (L) 3.5 - 5.0 g/dL   AST 10 (L) 15 - 41 U/L   ALT 8 0 - 44 U/L   Alkaline Phosphatase 124 38 - 126  U/L   Total Bilirubin 0.8 0.3 - 1.2 mg/dL   GFR calc non Af Amer 11 (L) >60 mL/min   GFR calc Af Amer 12 (L) >60 mL/min   Anion gap 13 5 - 15    Comment: Performed at [facility] [address], [place], [place] [address]  Lipase, blood     Status: None   Collection Time: 03/12/19  8:51 PM  Result Value Ref Range   Lipase 13 11 - 51 U/L    Comment: Performed at [facility], [place] [address], [place], [place] [address]  Ammonia     Status: Abnormal   Collection Time: 03/12/19  8:51 PM  Result Value Ref Range   Ammonia 61 (H) 9 - 35 umol/L    Comment: Performed at [facility] [address], [place], [place] [address]  CBC with Differential/Platelet     Status: Abnormal   Collection Time: 03/12/19 11:31 PM  Result Value Ref Range   WBC 8.5 4.0 - 10.5 K/uL   RBC 2.75 (L)  4.22 - 5.81 MIL/uL   Hemoglobin 8.3 (L) 13.0 - 17.0 g/dL   HCT 25.9 (L) 39.0 - 52.0 %   MCV 94.2 80.0 - 100.0 fL   MCH 30.2 26.0 - 34.0 pg   MCHC 32.0 30.0 - 36.0 g/dL   RDW 16.4 (H) 11.5 - 15.5 %   Platelets 228 150 - 400 K/uL   nRBC 0.0 0.0 - 0.2 %   Neutrophils Relative % 83 %   Neutro Abs 7.0 1.7 - 7.7 K/uL   Lymphocytes Relative 9 %   Lymphs Abs 0.8 0.7 - 4.0 K/uL   Monocytes Relative 6 %   Monocytes Absolute 0.6 0.1 - 1.0 K/uL   Eosinophils Relative 1 %   Eosinophils Absolute 0.1 0.0 - 0.5 K/uL   Basophils Relative 0 %   Basophils Absolute 0.0 0.0 - 0.1 K/uL   Immature Granulocytes 1 %   Abs Immature Granulocytes 0.07 0.00 - 0.07 K/uL    Comment: Performed at [facility] [address], [place], [place] [address]  Protime-INR     Status: Abnormal   Collection Time: 03/12/19 11:31 PM  Result Value Ref Range   Prothrombin Time 20.0 (H) 11.4 - 15.2 seconds   INR 1.7 (H) 0.8 - 1.2    Comment: (NOTE) INR goal varies based on device and disease states. Performed at [facility], [place] [address] [place], [place] [address]   APTT     Status: Abnormal   Collection Time: 03/12/19 11:31 PM  Result Value Ref Range   aPTT 39 (H) 24 - 36 seconds    Comment:        IF BASELINE aPTT IS ELEVATED, SUGGEST PATIENT RISK ASSESSMENT BE USED TO DETERMINE APPROPRIATE ANTICOAGULANT THERAPY. Performed at [facility], [place] [address] [place], [place] [address]   Magnesium     Status: None   Collection Time: 03/12/19 11:31 PM  Result Value Ref Range   Magnesium 1.8 1.7 - 2.4 mg/dL    Comment: Performed at [facility], [place] [address], [place], [place] [address]  Hemoglobin A1c     Status: Abnormal   Collection Time: 03/12/19 11:31 PM  Result Value Ref Range   Hgb A1c MFr Bld 6.5 (H) 4.8 - 5.6 %    Comment: (NOTE) Pre diabetes:          5.7%-6.4% Diabetes:              >  6.4% Glycemic control for   <7.0% adults with diabetes    Mean Plasma Glucose 139.85  mg/dL    Comment: Performed at [facility] [address], [place], [place] [address]    CT ABDOMEN PELVIS W CONTRAST  Result Date: 03/12/2019 CLINICAL DATA:  Pyelonephritis, sepsis, renal stent, left nephrectomy EXAM: CT ABDOMEN AND PELVIS WITH CONTRAST TECHNIQUE: Multidetector CT imaging of the abdomen and pelvis was performed using the standard protocol following bolus administration of intravenous contrast. CONTRAST:  172m OMNIPAQUE IOHEXOL 300 MG/ML  SOLN COMPARISON:  02/04/2019 FINDINGS: Lower chest: Small bilateral pleural effusions. Coronary artery calcifications and/or stents. Hepatobiliary: No focal liver abnormality is seen. Status post cholecystectomy. No biliary dilatation. Pancreas: Atrophic pancreas with parenchymal calcifications, in keeping with chronic stigmata of pancreatitis. No pancreatic ductal dilatation or surrounding inflammatory changes. Spleen: Splenomegaly. Adrenals/Urinary Tract: Adrenal glands are unremarkable. Redemonstrated postoperative findings of left nephrectomy. There has been interval enlargement of a left retroperitoneal air and fluid collection involving the left psoas, now measuring at least 17.4 x 5.9 cm (series 3, image 53). A previously seen right-sided double-J ureteral stent is been removed. A 1.3 cm calculus remains in the right renal pelvis with additional small right-sided calyceal calculi. Bladder is unremarkable. Stomach/Bowel: Stomach is within normal limits. No evidence of bowel wall thickening, distention, or inflammatory changes. Large stool balls in the distal sigmoid and rectum. Vascular/Lymphatic: No significant vascular findings are present. Multiple prominent retroperitoneal lymph nodes similar to prior examination. Reproductive: No mass or other significant abnormality. Other: Anasarca. No abdominopelvic ascites. Musculoskeletal: No acute or significant osseous findings. Intramedullary nail fixation of the left femoral neck. IMPRESSION: 1.  Status post left nephrectomy. Interval enlargement of left retroperitoneal air and fluid collection involving the left psoas, now measuring at least 17.4 x 5.9 cm and consistent with worsened abscess. 2. Interval removal of right-sided double-J ureteral stent. A 1.3 cm calculus remains in the right renal pelvis with additional small right-sided calyceal calculi. No hydronephrosis. 3. Large stool balls in the distal sigmoid and rectum. 4. Small bilateral pleural effusions. 5. Anasarca. 6. Splenomegaly. 7. Calcific stigmata of chronic pancreatitis. Electronically Signed   By: AEddie CandleM.D.   On: 03/12/2019 23:19   DG Chest Port 1 View  Result Date: 03/12/2019 CLINICAL DATA:  49year old male with chest pain shortness of breath fever nausea vomiting. EXAM: PORTABLE CHEST 1 VIEW COMPARISON:  Portable chest 12/25/2018 and earlier. FINDINGS: Portable AP semi upright view at 2234 hours. Lower lung volumes. Mediastinal contours remain within normal limits. Stable dual lumen right chest dialysis type catheter. Visualized tracheal air column is within normal limits. Crowding of lung markings. No pneumothorax, pleural effusion or consolidation. Increased pulmonary vascular congestion but no overt edema. No acute osseous abnormality identified. IMPRESSION: Lower lung volumes and increased pulmonary vascular congestion since December. No other acute cardiopulmonary abnormality. Electronically Signed   By: HGenevie AnnM.D.   On: 03/12/2019 22:41    Review of Systems  Constitutional: Positive for fatigue and fever.  HENT: Negative.   Eyes: Negative.   Respiratory: Negative.   Cardiovascular: Negative.   Gastrointestinal: Positive for abdominal distention.  Endocrine: Negative.   Genitourinary: Negative.   Musculoskeletal: Negative.   Allergic/Immunologic: Negative.   Neurological: Negative.   Hematological: Negative.   Psychiatric/Behavioral: Negative.   All other systems reviewed and are negative.  Blood  pressure 132/74, pulse 91, temperature (!) 102.8 F (39.3 C), temperature source Rectal, resp. rate 17, height 6' 2"  (1.88 m),  weight 90.7 kg, SpO2 95 %. Physical Exam  Constitutional:  Appears much older than stated age. Icteric.   Cardiovascular: Normal rate.  Respiratory: Effort normal.  GI:  Large truncal obesity. Some ascites.   Genitourinary:    Genitourinary Comments: Left flank incision well healed w/o drainage. Some tenderness, no erythema / site reaction.    Musculoskeletal:     Cervical back: Normal range of motion.  Neurological: He is alert.  Skin: Skin is warm.  Psychiatric: He has a normal mood and affect.    Assessment/Plan:  1 - Left Retroperitoneal Abscess - Pt extremely comorbid with major metabolic derangement. Prognosis poor. He is not canddiate for any sort of major surgery / open washout. Rec IR perc drain x 2 (at least), Fluid CX,  IV ABX.   2 -  End-Stage Renal Disease - per neprhlology.   3 -  Urolithiasis - stable non-obstructing rt renal stone, observe.   Alexis Frock 03/13/2019, 1:32 AM

## 2019-03-13 NOTE — Consult Note (Signed)
[facility] Renal Consultation Note    Indication for Consultation:  Management of ESRD/hemodialysis, anemia, hypertension/volume, and secondary hyperparathyroidism. PCP:  HPI: Eddie Davies is a 49 y.o. male with ESRD, HTN, T2DM, and ongoing/recurrent Klebsiella infection, as thoroughly detailed in H&P (copied to below).   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  Pt reports that has had intermittent pains and vomiting since last admission. Yesterday, his SNF sent him to the ED for worsening symptoms, fever, and in setting of missed dialysis 2/15.  In ED, initial labs showed K 2.6 -> 2.7, BUN 40, Cr 5.72, Ca 7.7, Alb 1.8, WBC 8.5, Hgb 8.2. CXR with increased vascular congestion. Abd/pelvic CT showed "left retroperitoneal air and fluid collection involving the left psoas, now measuring at least 17.4 x 5.9 cm" and R nonobstructing 15m renal pelvis stone. BCx drawn - start on meropenem based on prior C&S. Urology consulted - note indicates that he is not a candidate for open washout.  Today, seen in room - denies pain, vomiting since admit. No dyspnea.  [facility] HD was [facility]12. Has AVF which is ready for use - attempted twice and marginally successful - has TDC as well.  Past Medical History:  Diagnosis Date  . [place]  . Diabetes mellitus  without complication (HCC)    type II  . ESRD (end stage renal disease) ([facility]    MWF   . Hypertension   . Liver failure ([facility]   . Metabolic acidosis 021/1941 . Pneumonia    Past Surgical History:  Procedure Laterality Date  . AV FISTULA PLACEMENT Right 10/23/2018   Procedure: ARTERIOVENOUS (AV) FISTULA CREATION RIGHT ARM;  Surgeon: BSerafina Mitchell MD;  [place] [facility]  Service: Vascular;  Laterality: Right;  . CHOLECYSTECTOMY    . IR FLUORO GUIDE CV LINE RIGHT  05/30/2018  . IR [place] VASC ACCESS RIGHT  05/30/2018   History reviewed. No pertinent family history. Social History:  reports that he has been smoking cigarettes. He has been smoking about 0.25 packs per day. He has never used smokeless tobacco. He reports previous alcohol use. He reports that he does not use drugs.  ROS: As per HPI otherwise negative.  Physical Exam: Vitals:   03/13/19 0500 03/13/19 0530 03/13/19 0542 03/13/19 0631  BP: 104/67 114/64  (!) 142/82  Pulse: 78 74  80  Resp: 16 20  19   Temp:   98.7 F (37.1 C) 98.5 F (36.9 C)  TempSrc:   Oral Oral  SpO2: 98% 98%  100%  Weight:      Height:         General: Overweight man, NAD Skin: Diffuse dryness, ?ichthyosis Head: Normocephalic, atraumatic, sclera non-icteric. Neck: Supple without lymphadenopathy/masses. JVD not elevated. Lungs: Clear bilaterally to auscultation without wheezes, rales, or rhonchi. Breathing is unlabored. Heart: RRR with normal S1, S2. No murmurs, rubs, or gallops appreciated. Abdomen: Soft, non-tender, non-distended with normoactive bowel sounds.  Musculoskeletal:  Strength and tone appear  normal for age. Lower extremities: No edema or ischemic changes, no open wounds. Neuro: Alert and oriented X 3. Moves all extremities spontaneously. Psych:  Responds to questions appropriately with a normal affect. Dialysis Access: TDC + RUE AVF + thrill  Allergies  Allergen Reactions  . Fish Oil Swelling and Other (See Comments)         Prior to Admission medications   Medication Sig Start Date End Date Taking? Authorizing Provider  acetaminophen (TYLENOL) 325 MG tablet Take 650 mg by mouth every 8 (eight) hours as needed for fever (pain).   Yes [provider]  allopurinol (ZYLOPRIM) 100 MG tablet Take 100 mg by mouth daily.  08/01/18  Yes [provider]  calcium acetate (PHOSLO) 667 MG capsule Take 2,001 mg by mouth 3 (three) times daily with meals.    Yes [provider]  carbamazepine (CARBATROL) 100 MG 12 hr capsule Take 100 mg by mouth 2 (two) times daily. 02/23/19  Yes [provider]  carbamazepine (TEGRETOL XR) 400 MG 12 hr tablet Take 800 mg by mouth 2 (two) times daily.   Yes [provider]  cloNIDine (CATAPRES) 0.1 MG tablet Take 0.1 mg by mouth 2 (two) times daily as needed (SBP ABOVE 170).  07/25/18  Yes [provider]  DULoxetine (CYMBALTA) 20 MG capsule Take 20 mg by mouth daily.   Yes [provider]  ferrous sulfate 325 (65 FE) MG tablet Take 325 mg by mouth daily with breakfast.   Yes [provider]  guaiFENesin (MUCINEX) 600 MG 12 hr tablet Take 600 mg by mouth every 12 (twelve) hours as needed for cough (congestion).   Yes [provider]  insulin aspart (NOVOLOG) 100 UNIT/ML injection Inject 0-15 Units into the skin 4 (four) times daily -  with meals and at bedtime. 70-120=0 units, 121-150=2 units, 151-200=3 units, 201-250=5 units, 251-300=8 units, 301-350=11 units, 351-400=15 units, >400 call MD   Yes [provider]  Insulin Glargine (BASAGLAR KWIKPEN) 100 UNIT/ML SOPN Inject 0.28 mLs (28 Units total) into the skin at bedtime. Patient taking differently: Inject 20 Units into the skin at bedtime.  12/05/18 03/13/19 Yes [place] Eric J, DO  Ipratropium-Albuterol (COMBIVENT) 20-100 MCG/ACT AERS respimat Inhale 1 puff into the lungs every 6 (six) hours as needed for wheezing.   Yes [provider]  lactulose  (CHRONULAC) 10 GM/15ML solution Take 15 mLs (10 g total) by mouth 2 (two) times daily. Patient taking differently: Take 30 g by mouth 2 (two) times daily.  05/31/18  Yes [name], [name] K, MD  lidocaine-prilocaine (EMLA) cream Apply 1 application topically every Monday, Wednesday, and Friday.   Yes [provider]  Multiple Vitamin (MULTIVITAMIN WITH MINERALS) TABS tablet Take 1 tablet by mouth at bedtime.   Yes [provider]  omeprazole (PRILOSEC) 20 MG capsule Take 20 mg by mouth daily.  07/04/18  Yes [provider]  ondansetron (ZOFRAN) 4 MG tablet Take 4 mg by mouth every 8 (eight) hours as needed for nausea or vomiting.  08/03/18  Yes [provider]  PHENobarbital (LUMINAL) 64.8 MG tablet Take 4 tablets (259.2 mg total) by mouth at bedtime. 12/05/18 03/13/19 Yes [place] Eric J, DO  PROTEIN PO Take 30 mLs by mouth in the morning and at bedtime.   Yes [provider]  simvastatin (ZOCOR) 20 MG tablet Take 20 mg by mouth at bedtime.    Yes [provider]  sodium bicarbonate 650 MG tablet Take 650 mg  by mouth 3 (three) times daily.   Yes [provider]  tamsulosin (FLOMAX) 0.4 MG CAPS capsule Take 0.4 mg by mouth at bedtime.   Yes [provider]  Darbepoetin Alfa (ARANESP) 200 MCG/0.4ML SOSY injection Inject 0.4 mLs (200 mcg total) into the vein every Thursday with hemodialysis. 06/28/18   Amin, Jeanella Flattery, MD  HYDROcodone-acetaminophen (NORCO) 5-325 MG tablet Take 1 tablet by mouth every 6 (six) hours as needed for moderate pain. 12/05/18   [place] Eric J, DO   Current Facility-Administered Medications  Medication Dose Route Frequency Provider Last Rate Last Admin  . acetaminophen (TYLENOL) tablet 650 mg  650 mg Oral Q6H PRN Etta Quill, DO       Or  . acetaminophen (TYLENOL) suppository 650 mg  650 mg Rectal Q6H PRN Etta Quill, DO      . allopurinol (ZYLOPRIM) tablet 100 mg  100 mg Oral Daily Jennette Kettle M,  DO      . calcium acetate (PHOSLO) capsule 2,001 mg  2,001 mg Oral TID WC Etta Quill, DO      . carbamazepine (TEGRETOL) tablet 900 mg  900 mg Oral BID Jennette Kettle M, DO   900 mg at 03/13/19 1042  . Chlorhexidine Gluconate Cloth 2 % PADS 6 each  6 each Topical Q0600 Loren Racer, [name]   6 each at 03/13/19 1043  . ferrous sulfate tablet 325 mg  325 mg Oral Q breakfast Jennette Kettle M, DO      . FLUoxetine (PROZAC) capsule 20 mg  20 mg Oral Daily Jennette Kettle M, DO   20 mg at 03/13/19 1042  . insulin aspart (novoLOG) injection 0-9 Units  0-9 Units Subcutaneous Q4H Alcario Drought, Jared M, DO      . lactulose (CHRONULAC) 10 GM/15ML solution 20 g  20 g Oral BID Jennette Kettle M, DO      . meropenem (MERREM) 500 mg in sodium chloride 0.9 % 100 mL IVPB  500 mg Intravenous Q24H Bertis Ruddy, RPH      . multivitamin with minerals tablet 1 tablet  1 tablet Oral QHS Jennette Kettle M, DO      . ondansetron [facility]) tablet 4 mg  4 mg Oral Q6H PRN Etta Quill, DO       Or  . ondansetron [facility]) injection 4 mg  4 mg Intravenous Q6H PRN Etta Quill, DO      . pantoprazole (PROTONIX) EC tablet 40 mg  40 mg Oral Daily Jennette Kettle M, DO      . PHENobarbital (LUMINAL) tablet 259.2 mg  259.2 mg Oral QHS Jennette Kettle M, DO      . simvastatin (ZOCOR) tablet 20 mg  20 mg Oral QHS Jennette Kettle M, DO      . sodium bicarbonate tablet 650 mg  650 mg Oral TID Etta Quill, DO      . tamsulosin [facility]) capsule 0.4 mg  0.4 mg Oral QHS Etta Quill, DO       Labs: Basic Metabolic Panel: Recent Labs  Lab 03/12/19 2051 03/13/19 0423  NA 140 138  K 2.6* 2.7*  CL 104 101  CO2 23 21*  GLUCOSE 59* 81  BUN 40* 41*  CREATININE 5.72* 5.85*  CALCIUM 7.7* 7.5*   Liver Function Tests: Recent Labs  Lab 03/12/19 2051 03/13/19 0423  AST 10* 10*  ALT 8 7  ALKPHOS 124 108  BILITOT 0.8 0.4  PROT 6.6 5.6*  ALBUMIN  1.8* 1.5*   Recent Labs  Lab 03/12/19 2051  LIPASE 13    Recent Labs  Lab 03/12/19 2051  AMMONIA 61*   CBC: Recent Labs  Lab 03/12/19 2331 03/13/19 0423  WBC 8.5 8.5  NEUTROABS 7.0  --   HGB 8.3* 8.2*  HCT 25.9* 25.3*  MCV 94.2 93.0  PLT 228 216   CBG: Recent Labs  Lab 03/13/19 0200 03/13/19 0240 03/13/19 0356 03/13/19 0757  GLUCAP 68* 95 79 99   Studies/Results: CT ABDOMEN PELVIS W CONTRAST  Result Date: 03/12/2019 CLINICAL DATA:  Pyelonephritis, sepsis, renal stent, left nephrectomy EXAM: CT ABDOMEN AND PELVIS WITH CONTRAST TECHNIQUE: Multidetector CT imaging of the abdomen and pelvis was performed using the standard protocol following bolus administration of intravenous contrast. CONTRAST:  181m OMNIPAQUE IOHEXOL 300 MG/ML  SOLN COMPARISON:  02/04/2019 FINDINGS: Lower chest: Small bilateral pleural effusions. Coronary artery calcifications and/or stents. Hepatobiliary: No focal liver abnormality is seen. Status post cholecystectomy. No biliary dilatation. Pancreas: Atrophic pancreas with parenchymal calcifications, in keeping with chronic stigmata of pancreatitis. No pancreatic ductal dilatation or surrounding inflammatory changes. Spleen: Splenomegaly. Adrenals/Urinary Tract: Adrenal glands are unremarkable. Redemonstrated postoperative findings of left nephrectomy. There has been interval enlargement of a left retroperitoneal air and fluid collection involving the left psoas, now measuring at least 17.4 x 5.9 cm (series 3, image 53). A previously seen right-sided double-J ureteral stent is been removed. A 1.3 cm calculus remains in the right renal pelvis with additional small right-sided calyceal calculi. Bladder is unremarkable. Stomach/Bowel: Stomach is within normal limits. No evidence of bowel wall thickening, distention, or inflammatory changes. Large stool balls in the distal sigmoid and rectum. Vascular/Lymphatic: No significant vascular findings are present. Multiple prominent retroperitoneal lymph nodes similar to prior  examination. Reproductive: No mass or other significant abnormality. Other: Anasarca. No abdominopelvic ascites. Musculoskeletal: No acute or significant osseous findings. Intramedullary nail fixation of the left femoral neck. IMPRESSION: 1. Status post left nephrectomy. Interval enlargement of left retroperitoneal air and fluid collection involving the left psoas, now measuring at least 17.4 x 5.9 cm and consistent with worsened abscess. 2. Interval removal of right-sided double-J ureteral stent. A 1.3 cm calculus remains in the right renal pelvis with additional small right-sided calyceal calculi. No hydronephrosis. 3. Large stool balls in the distal sigmoid and rectum. 4. Small bilateral pleural effusions. 5. Anasarca. 6. Splenomegaly. 7. Calcific stigmata of chronic pancreatitis. Electronically Signed   By: AEddie CandleM.D.   On: 03/12/2019 23:19   DG Chest Port 1 View  Result Date: 03/12/2019 CLINICAL DATA:  49year old male with chest pain shortness of breath fever nausea vomiting. EXAM: PORTABLE CHEST 1 VIEW COMPARISON:  Portable chest 12/25/2018 and earlier. FINDINGS: Portable AP semi upright view at 2234 hours. Lower lung volumes. Mediastinal contours remain within normal limits. Stable dual lumen right chest dialysis type catheter. Visualized tracheal air column is within normal limits. Crowding of lung markings. No pneumothorax, pleural effusion or consolidation. Increased pulmonary vascular congestion but no overt edema. No acute osseous abnormality identified. IMPRESSION: Lower lung volumes and increased pulmonary vascular congestion since December. No other acute cardiopulmonary abnormality. Electronically Signed   By: HGenevie AnnM.D.   On: 03/12/2019 22:41   [facility]/800, EDW 94kg, 3K/2.25Ca, TDC, heparin 3000 bolus - Mircera 1551m IV q 2 weeks (2/10)  Assessment/Plan: 1. L retroperitoneal abscess: Recurrent/ongoing from prior hospitalization after  nephrectomy. Urology consulted - not felt to be candidate for  open wash-out, for IR drain and Meropenem. 2.  ESRD: Will continue HD on MWF schedule - HD today. K very low on admit - supplemented, will repeat and plan to use high K dialysis bath to correct. 3.  Hypertension/volume: BP borderline - no overt edema on exam, CXR with ^ vasc congestion - UF as tolerated. 4.  Anemia: Hgb 8.2 - just given ESA as outpt - follow for now. 5.  Metabolic bone disease: CorrCa ok, Phos pending. Follow 6.  T2DM 7.  R kidney stone: Per CT - non-obstructing at the moment, asymptomatic.  Veneta Penton, [name] 03/13/2019, 11:43 AM  [facility] Pager: [phone]

## 2019-03-13 NOTE — Social Work (Addendum)
Pt from [facility] as a LTC pt.  CSW continuing to follow for support with disposition when medically appropriate.  [name], MSW, [place] [employer]

## 2019-03-13 NOTE — Procedures (Signed)
Patient seen and examined on Hemodialysis. BP 113/65   Pulse 87   Temp 98.2 F (36.8 C) (Oral)   Resp 18   Ht 6' 2"  (1.88 m)   Wt 93.4 kg   SpO2 98%   BMI 26.44 kg/m   QB 400 via TDC UF goal 2.5L  Has an AVF which was tried x 2 as OP but unsuccessful, appears to be very deep.  Tolerating treatment without complaints at this time.   Madelon Lips MD [facility] 4:41 PM

## 2019-03-13 NOTE — H&P (Signed)
History and Physical    Eddie Davies VQM:[id number] DOB: 03-May-1970 DOA: 03/12/2019  PCP: Practice, [facility]  Patient coming from: SNF  I have personally briefly reviewed patient's old medical records in [place]  Chief Complaint: N/V  HPI: Eddie Davies is a 49 y.o. male with medical history significant of ESRD on HD MWF, ESLD, ESBL.  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  Patient presents to ED from SNF with 2-3 day h/o N/V, unable to keep down solids or liquids.  Does make urine, had hematuria up until 4-5 days ago but this resolved.  Reports some pain "where they took out my kidney".  ED Course: Tm 102.x.  CT abd/pelvis reveals enlarging retroperitoneal abscess compared to 1/11.  Started on merrem, hospitalist asked to admit.   Review of Systems: As per HPI, otherwise all review of systems negative.  Past Medical History:  Diagnosis Date  . Cirrhosis ([place]) 04/2018  . Diabetes mellitus without complication (HCC)    type II  . ESRD (end stage renal disease) ([place])    MWF   . Hypertension   . Liver failure ([place])   . Metabolic acidosis 93/2671  . Pneumonia     Past Surgical History:  Procedure Laterality Date  . AV FISTULA PLACEMENT Right 10/23/2018   Procedure: ARTERIOVENOUS  (AV) FISTULA CREATION RIGHT ARM;  Surgeon: [name], MD;  Location: [facility];  Service: Vascular;  Laterality: Right;  . CHOLECYSTECTOMY    . IR FLUORO GUIDE CV LINE RIGHT  05/30/2018  . IR US GUIDE VASC ACCESS RIGHT  05/30/2018     reports that he has been smoking cigarettes. He has been smoking about 0.25 packs per day. He has never used smokeless tobacco. He reports previous alcohol use. He reports that he does not use drugs.  Allergies  Allergen Reactions  . Fish Oil Swelling and Other (See Comments)         History reviewed. No pertinent family history.   Prior to Admission medications   Medication Sig Start Date End Date Taking? Authorizing Provider  acetaminophen (TYLENOL) 325 MG tablet Take 650 mg by mouth every 8 (eight) hours as needed for fever (pain).    [provider]  allopurinol (ZYLOPRIM) 100 MG tablet Take 100 mg by mouth daily.  08/01/18   [provider]  Amino Acids-Protein Hydrolys (FEEDING SUPPLEMENT, PRO-STAT SUGAR FREE 64,) LIQD Take 30 mLs by mouth 2 (two) times a day.    [provider]  amLODipine (NORVASC) 5 MG tablet Take 5 mg by mouth at bedtime.    [provider]  calcium acetate (PHOSLO) 667 MG capsule Take 2,001 mg by mouth 3 (three) times daily with meals.     [provider]  carbamazepine (TEGRETOL) 200 MG tablet Take 800 mg by mouth 2 (two) times daily.    [provider]  Cholecalciferol (VITAMIN D) 50 MCG (2000 UT) tablet Take 2,000  Units by mouth daily.    [provider]  cloNIDine (CATAPRES) 0.1 MG tablet Take 0.1 mg by mouth 2 (two) times daily as needed (SBP ABOVE 170).  07/25/18   [provider]  Darbepoetin Alfa (ARANESP) 200 MCG/0.4ML SOSY injection Inject 0.4 mLs (200 mcg total) into the vein every Thursday with hemodialysis. 06/28/18   Amin, Jeanella Flattery, MD  ferrous sulfate 325 (65 FE) MG tablet Take 325 mg by mouth daily with breakfast.    [provider]    guaiFENesin (MUCINEX) 600 MG 12 hr tablet Take 600 mg by mouth every 12 (twelve) hours as needed for cough (congestion).    [provider]  HYDROcodone-acetaminophen (NORCO) 5-325 MG tablet Take 1 tablet by mouth every 6 (six) hours as needed for moderate pain. 12/05/18   [place] Eric J, DO  insulin aspart (NOVOLOG) 100 UNIT/ML injection Inject 0-15 Units into the skin 4 (four) times daily -  with meals and at bedtime. 70-120=0 units, 121-150=2 units, 151-200=3 units, 201-250=5 units, 251-300=8 units, 301-350=11 units, 351-400=15 units, >400 call MD    [provider]  Insulin Glargine (BASAGLAR KWIKPEN) 100 UNIT/ML SOPN Inject 0.28 mLs (28 Units total) into the skin at bedtime. 12/05/18 01/04/19  [place] Donnamarie Poag, DO  lactulose (CHRONULAC) 10 GM/15ML solution Take 15 mLs (10 g total) by mouth 2 (two) times daily. Patient taking differently: Take 30 g by mouth 2 (two) times daily.  05/31/18   [name], Vernelle Emerald, MD  lidocaine-prilocaine (EMLA) cream Apply 1 application topically every Monday, Wednesday, and Friday.    [provider]  Multiple Vitamin (MULTIVITAMIN WITH MINERALS) TABS tablet Take 1 tablet by mouth at bedtime.    [provider]  Nutritional Supplements (FEEDING SUPPLEMENT, NEPRO CARB STEADY,) LIQD Take 237 mLs by mouth 2 (two) times daily between meals. 04/30/18   [name], [name], MD  omeprazole (PRILOSEC) 20 MG capsule Take 20 mg by mouth daily.  07/04/18   [provider]  ondansetron (ZOFRAN) 4 MG tablet Take 4 mg by mouth every 8 (eight) hours as needed for nausea or vomiting.  08/03/18   [provider]  PHENobarbital (LUMINAL) 64.8 MG tablet Take 4 tablets (259.2 mg total) by mouth at bedtime. 12/05/18 01/04/19  [place] Donnamarie Poag, DO  simvastatin (ZOCOR) 20 MG tablet Take 20 mg by mouth at bedtime.     [provider]  tamsulosin (FLOMAX) 0.4 MG CAPS capsule Take 0.4 mg by mouth at bedtime.    [provider]   TRADJENTA 5 MG TABS tablet Take 5 mg by mouth daily.  10/16/18   [provider]    Physical Exam: Vitals:   03/12/19 2047 03/12/19 2048 03/12/19 2146 03/13/19 0049  BP: (!) 151/84 (!) 157/82  132/74  Pulse: 94 88  91  Resp: (!) 22 18  17   Temp: 100 F (37.8 C) (!) 100.6 F (38.1 C) (!) 102.8 F (39.3 C)   TempSrc: Oral Oral Rectal   SpO2: 100% 98%  95%  Weight:  90.7 kg    Height:  6' 2"  (1.88 m)      Constitutional: NAD, calm, comfortable Eyes: PERRL, lids and conjunctivae normal ENMT: Mucous membranes are moist. Posterior pharynx clear of any exudate or lesions.Normal dentition.  Neck: normal, supple, no masses, no thyromegaly Respiratory: clear to auscultation bilaterally, no wheezing, no crackles. Normal respiratory effort. No accessory muscle use.  Cardiovascular: Regular rate and rhythm, no murmurs / rubs / gallops. No extremity edema.  2+ pedal pulses. No carotid bruits.  Abdomen: no tenderness, no masses palpated. No hepatosplenomegaly. Bowel sounds positive.  Musculoskeletal: no clubbing / cyanosis. No joint deformity upper and lower extremities. Good ROM, no contractures. Normal muscle tone.  Skin: no rashes, lesions, ulcers. No induration Neurologic: CN 2-12 grossly intact. Sensation intact, DTR normal. Strength 5/5 in all 4.  Psychiatric: Normal judgment and insight. Alert and oriented x 3. Normal mood.    Labs on Admission: I have personally reviewed following labs and imaging studies  CBC: Recent Labs  Lab 03/12/19 2331  WBC 8.5  NEUTROABS 7.0  HGB 8.3*  HCT 25.9*  MCV 94.2  PLT 237   Basic Metabolic Panel: Recent Labs  Lab 03/12/19 2051 03/12/19 2331  NA 140  --   K 2.6*  --   CL 104  --   CO2 23  --   GLUCOSE 59*  --   BUN 40*  --   CREATININE 5.72*  --   CALCIUM 7.7*  --   MG  --  1.8   GFR: Estimated Creatinine Clearance: 18.4 mL/min (A) (by C-G formula based on SCr of 5.72 mg/dL (H)). Liver Function Tests: Recent Labs  Lab  03/12/19 2051  AST 10*  ALT 8  ALKPHOS 124  BILITOT 0.8  PROT 6.6  ALBUMIN 1.8*   Recent Labs  Lab 03/12/19 2051  LIPASE 13   Recent Labs  Lab 03/12/19 2051  AMMONIA 61*   Coagulation Profile: Recent Labs  Lab 03/12/19 2331  INR 1.7*   Cardiac Enzymes: No results for input(s): CKTOTAL, CKMB, CKMBINDEX, TROPONINI in the last 168 hours. BNP (last 3 results) No results for input(s): PROBNP in the last 8760 hours. HbA1C: Recent Labs    03/12/19 2331  HGBA1C 6.5*   CBG: No results for input(s): GLUCAP in the last 168 hours. Lipid Profile: No results for input(s): CHOL, HDL, LDLCALC, TRIG, CHOLHDL, LDLDIRECT in the last 72 hours. Thyroid Function Tests: No results for input(s): TSH, T4TOTAL, FREET4, T3FREE, THYROIDAB in the last 72 hours. Anemia Panel: No results for input(s): VITAMINB12, FOLATE, FERRITIN, TIBC, IRON, RETICCTPCT in the last 72 hours. Urine analysis:    Component Value Date/Time   COLORURINE RED (A) 11/28/2018 0431   APPEARANCEUR TURBID (A) 11/28/2018 0431   LABSPEC  11/28/2018 0431    TEST NOT REPORTED DUE TO COLOR INTERFERENCE OF URINE PIGMENT   PHURINE  11/28/2018 0431    TEST NOT REPORTED DUE TO COLOR INTERFERENCE OF URINE PIGMENT   GLUCOSEU (A) 11/28/2018 0431    TEST NOT REPORTED DUE TO COLOR INTERFERENCE OF URINE PIGMENT   HGBUR (A) 11/28/2018 0431    TEST NOT REPORTED DUE TO COLOR INTERFERENCE OF URINE PIGMENT   BILIRUBINUR (A) 11/28/2018 0431    TEST NOT REPORTED DUE TO COLOR INTERFERENCE OF URINE PIGMENT   KETONESUR (A) 11/28/2018 0431    TEST NOT REPORTED DUE TO COLOR INTERFERENCE OF URINE PIGMENT   PROTEINUR (A) 11/28/2018 0431    TEST NOT REPORTED DUE TO COLOR INTERFERENCE OF URINE PIGMENT   NITRITE (A) 11/28/2018 0431    TEST NOT REPORTED DUE TO COLOR INTERFERENCE OF URINE PIGMENT   LEUKOCYTESUR (A) 11/28/2018 0431    TEST NOT REPORTED DUE TO COLOR INTERFERENCE OF URINE PIGMENT    Radiological Exams on Admission: CT ABDOMEN  PELVIS W CONTRAST  Result Date: 03/12/2019 CLINICAL DATA:  Pyelonephritis, sepsis, renal stent, left nephrectomy EXAM: CT ABDOMEN AND PELVIS WITH CONTRAST TECHNIQUE: Multidetector CT imaging of  the abdomen and pelvis was performed using the standard protocol following bolus administration of intravenous contrast. CONTRAST:  182m OMNIPAQUE IOHEXOL 300 MG/ML  SOLN COMPARISON:  02/04/2019 FINDINGS: Lower chest: Small bilateral pleural effusions. Coronary artery calcifications and/or stents. Hepatobiliary: No focal liver abnormality is seen. Status post cholecystectomy. No biliary dilatation. Pancreas: Atrophic pancreas with parenchymal calcifications, in keeping with chronic stigmata of pancreatitis. No pancreatic ductal dilatation or surrounding inflammatory changes. Spleen: Splenomegaly. Adrenals/Urinary Tract: Adrenal glands are unremarkable. Redemonstrated postoperative findings of left nephrectomy. There has been interval enlargement of a left retroperitoneal air and fluid collection involving the left psoas, now measuring at least 17.4 x 5.9 cm (series 3, image 53). A previously seen right-sided double-J ureteral stent is been removed. A 1.3 cm calculus remains in the right renal pelvis with additional small right-sided calyceal calculi. Bladder is unremarkable. Stomach/Bowel: Stomach is within normal limits. No evidence of bowel wall thickening, distention, or inflammatory changes. Large stool balls in the distal sigmoid and rectum. Vascular/Lymphatic: No significant vascular findings are present. Multiple prominent retroperitoneal lymph nodes similar to prior examination. Reproductive: No mass or other significant abnormality. Other: Anasarca. No abdominopelvic ascites. Musculoskeletal: No acute or significant osseous findings. Intramedullary nail fixation of the left femoral neck. IMPRESSION: 1. Status post left nephrectomy. Interval enlargement of left retroperitoneal air and fluid collection involving the  left psoas, now measuring at least 17.4 x 5.9 cm and consistent with worsened abscess. 2. Interval removal of right-sided double-J ureteral stent. A 1.3 cm calculus remains in the right renal pelvis with additional small right-sided calyceal calculi. No hydronephrosis. 3. Large stool balls in the distal sigmoid and rectum. 4. Small bilateral pleural effusions. 5. Anasarca. 6. Splenomegaly. 7. Calcific stigmata of chronic pancreatitis. Electronically Signed   By: AEddie CandleM.D.   On: 03/12/2019 23:19   DG Chest Port 1 View  Result Date: 03/12/2019 CLINICAL DATA:  49year old male with chest pain shortness of breath fever nausea vomiting. EXAM: PORTABLE CHEST 1 VIEW COMPARISON:  Portable chest 12/25/2018 and earlier. FINDINGS: Portable AP semi upright view at 2234 hours. Lower lung volumes. Mediastinal contours remain within normal limits. Stable dual lumen right chest dialysis type catheter. Visualized tracheal air column is within normal limits. Crowding of lung markings. No pneumothorax, pleural effusion or consolidation. Increased pulmonary vascular congestion but no overt edema. No acute osseous abnormality identified. IMPRESSION: Lower lung volumes and increased pulmonary vascular congestion since December. No other acute cardiopulmonary abnormality. Electronically Signed   By: HGenevie AnnM.D.   On: 03/12/2019 22:41    EKG: Independently reviewed.  Assessment/Plan Principal Problem:   Retroperitoneal abscess (HCC) Active Problems:   End stage liver disease (HCC)   Seizures (HCC)   Sepsis ([place]   Benign essential HTN   Diabetes mellitus type 2, uncontrolled, with complications ([place]   ESRD (end stage renal disease) on dialysis ([place]   Infection due to ESBL-producing Klebsiella pneumoniae    1. Retroperitoneal abscess due to ESBL K.Pneumo - 1. Of note the abscess on 1/11 grew out ESBL K.Pneumo ([facility]), sensitive to carbapenems, but intermediate to aminoglycosides. 2. Pt  failed ABx and IR drainage last month, abscess now bigger than it was on 1/11 and pt with fevers. 3. Starting merrem. 4. Dr. MCarrie Mewto see in AM 5. Will keep NPO in case he wants to take to OR for open washout 6. May want to get ID consult in AM. 7. BCx pending 2. ESLD - 1. Continue home lactulose 3.  ESRD - 1. Call nephro in AM for routine IP dialysis during stay 4. Seizure disorder - 1. Continue Tegretol and Phenobarbital 5. DM2 - 1. Hold home insulin while NPO 2. Sensitive scale SSI Q4H 6. HTN - 1. Cont flomax 2. Looks like they stopped amlodipine and just have him on PRN clonidine now 3. Monitor BPs and add a PRN if needed, though 132/74 currently  DVT prophylaxis: SCDs Code Status: Full Family Communication: No family in room Disposition Plan: SNF after admit Consults called: EDP spoke with Dr. Carrie Mew Admission status: Admit to inpatient  Severity of Illness: The appropriate patient status for this patient is INPATIENT. Inpatient status is judged to be reasonable and necessary in order to provide the required intensity of service to ensure the patient's safety. The patient's presenting symptoms, physical exam findings, and initial radiographic and laboratory data in the context of their chronic comorbidities is felt to place them at high risk for further clinical deterioration. Furthermore, it is not anticipated that the patient will be medically stable for discharge from the hospital within 2 midnights of admission. The following factors support the patient status of inpatient.   IP status for treatment of retroperitoneal abscess due to ESBL K.Pneumo.  Needs merrem and drainage.   * I certify that at the point of admission it is my clinical judgment that the patient will require inpatient hospital care spanning beyond 2 midnights from the point of admission due to high intensity of service, high risk for further deterioration and high frequency of surveillance  required.*    Annagrace Carr M. DO [facility]  How to contact the [facility] Attending or Consulting provider [place] or covering provider during after hours [place], for this patient?  1. Check the care team in [facility] and look for a) attending/consulting [facility] provider listed and b) the [facility] team listed 2. Log into [email or web address]  Amion Physician Scheduling and messaging for groups and whole hospitals  On call and physician scheduling software for group practices, residents, hospitalists and other medical providers for call, clinic, rotation and shift schedules. OnCall [employer] is a hospital-wide system for scheduling doctors and paging doctors on call. EasyPlot is for scientific plotting and data analysis.  [email or web address]  and use [place]'s universal password to access. If you do not have the password, please contact the hospital operator.  3. Locate the [facility] provider you are looking for under [facility] and page to a number that you can be directly reached. 4. If you still have difficulty reaching the provider, please page the [facility] (Director on Call) for the Hospitalists listed on amion for assistance.  03/13/2019, 1:31 AM

## 2019-03-13 NOTE — Progress Notes (Signed)
Eddie Davies is a 49 y.o. male with a history of ESRD on HD, ESBL who was admitted early this morning by Dr. Alcario Drought for retroperitoneal abscess from SNF.  He was recently admitted for left-sided pyelonephritis and sepsis status post left-sided radical nephrectomy and right double-J stent placement at OSH who presented with N/V/fever.  Has since been evaluated by nephrology and patient underwent HD today.  Also had IR drain placed today.  See below from H&P: Patient has been battling K.Pneumo for past several months, recent admits include:  11/3-11/11/2018 at [facility] for sepsis related to L sided pyelo  12/26/2018-01/29/2019 Admission to [facility] for sepsis, concern for dialysis cath infection, klebsiella bacteremia, left-sided emphysematous pyelonephritis with right renal pelvis stone, left-sided radical nephrectomy and right stent performed on 01/09/2019, subsequently in ICU on vasopressors, + Covid test on 01/03/2019, uncontrolled blood sugars without DKA  01/12-01/20/21 Admission to [facility] for retroperitoneal fluid collection, IV abx with IR drain placement 02/06/19, with improvement on repeat imaging  03/07/2019 R ureteral stent removal.  Currently, patient states he is still having some discomfort from where he had a procedure done.  Otherwise no issues  A/P  1. Retroperitoneal abscess -further recommendations per IR and urology.  Currently on meropenem.  Will keep n.p.o. after midnight for any procedures 2. 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 ESRD on HD M, W, F -had HD today.  Continue following with nephro 4. Seizure disorder continue Tegretol and phenobarbital 5. Diabetes -continue insulin 6. Hypertension continue current regimen 7. Hypokalemia -repleted by nephro    Harold Hedge, DO [facility] Pager [phone]

## 2019-03-13 NOTE — Progress Notes (Signed)
Patient arrived to unit on stretcher accomapanied by Raina Mina, RN. Patient alert and oriented X4. Large, yellow bowel movement noted. Cleaned up prior to transfer to bed. Skin check performed with transferring RN. Stage 1 pressure ulcer noted on coccyx. Mepilex applied. Skin from head to toe noted to be dry and flaky. Patient oriented to room. NPO continues at present. Telemetry monitoring applied. Continue ongoing monitoring and plan of care.

## 2019-03-13 NOTE — Plan of Care (Signed)
  Problem: Education: Goal: Knowledge of General Education information will improve Description: Including pain rating scale, medication(s)/side effects and non-pharmacologic comfort measures Outcome: Progressing   Problem: Health Behavior/Discharge Planning: Goal: Ability to manage health-related needs will improve Outcome: Progressing   Problem: Elimination: Goal: Will not experience complications related to bowel motility Outcome: Progressing   Problem: Pain Managment: Goal: General experience of comfort will improve Outcome: Progressing   Problem: Safety: Goal: Ability to remain free from injury will improve Outcome: Progressing   Problem: Skin Integrity: Goal: Risk for impaired skin integrity will decrease Outcome: Progressing

## 2019-03-13 NOTE — Progress Notes (Signed)
Pt returned to 6N18 via bed after dialysis. Will continue to monitor.

## 2019-03-13 NOTE — ED Provider Notes (Signed)
Assumed care from PA Geiple at shift change.  See prior notes for full H&P.  Briefly, 49 y.o. 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  Found to have repeat fluid collection on CT today.  Has been hemodynamically stable.  Plan: Labs need to be redrawn.  Will require admission.  Require admission at [facility], however patient is requesting admission to [facility] at this time.  Results for orders placed or performed during the hospital encounter of 03/12/19  Lactic acid, plasma  Result Value Ref Range   Lactic Acid, Venous 0.8 0.5 - 1.9 mmol/L  Comprehensive metabolic panel  Result Value Ref Range   Sodium 140 135 - 145 mmol/L   Potassium 2.6 (LL) 3.5 - 5.1 mmol/L   Chloride 104 98 - 111 mmol/L   CO2 23 22 - 32 mmol/L   Glucose, Bld 59 (L) 70 - 99 mg/dL   BUN 40 (H) 6 - 20 mg/dL   Creatinine, Ser 5.72 (H) 0.61 - 1.24 mg/dL   Calcium 7.7 (L) 8.9 - 10.3 mg/dL   Total Protein 6.6 6.5 - 8.1 g/dL   Albumin 1.8 (L) 3.5 - 5.0 g/dL   AST 10 (L) 15 - 41 U/L   ALT 8 0 - 44 U/L   Alkaline Phosphatase 124 38 - 126 U/L   Total Bilirubin 0.8 0.3 - 1.2 mg/dL   GFR calc non Af Amer 11 (L) >60 mL/min   GFR calc Af Amer 12 (L) >60 mL/min   Anion gap 13 5 - 15  Lipase, blood  Result Value Ref Range   Lipase 13 11 - 51 U/L  Ammonia  Result Value Ref Range   Ammonia 61 (H) 9 - 35 umol/L  CBC with Differential/Platelet  Result Value Ref Range   WBC 8.5 4.0 - 10.5 K/uL   RBC 2.75 (L) 4.22 - 5.81 MIL/uL   Hemoglobin 8.3 (L) 13.0 - 17.0 g/dL   HCT 25.9 (L) 39.0 - 52.0 %   MCV 94.2 80.0 - 100.0 fL   MCH 30.2 26.0 - 34.0 pg   MCHC 32.0 30.0 - 36.0 g/dL   RDW 16.4 (H) 11.5 - 15.5 %   Platelets 228 150 - 400 K/uL   nRBC 0.0 0.0 - 0.2 %   Neutrophils Relative % 83 %   Neutro Abs 7.0 1.7 - 7.7 K/uL   Lymphocytes  Relative 9 %   Lymphs Abs 0.8 0.7 - 4.0 K/uL   Monocytes Relative 6 %   Monocytes Absolute 0.6 0.1 - 1.0 K/uL   Eosinophils Relative 1 %   Eosinophils Absolute 0.1 0.0 - 0.5 K/uL   Basophils Relative 0 %   Basophils Absolute 0.0 0.0 - 0.1 K/uL   Immature Granulocytes 1 %   Abs Immature Granulocytes 0.07 0.00 - 0.07 K/uL  Protime-INR  Result Value Ref Range   Prothrombin Time 20.0 (H) 11.4 - 15.2 seconds   INR 1.7 (H) 0.8 - 1.2  APTT  Result Value Ref Range   aPTT 39 (H) 24 - 36 seconds  Magnesium  Result Value Ref Range   Magnesium 1.8 1.7 - 2.4 mg/dL  Hemoglobin A1c  Result Value Ref Range   Hgb A1c MFr Bld 6.5 (H) 4.8 - 5.6 %   Mean Plasma Glucose 139.85 mg/dL   CT ABDOMEN PELVIS W CONTRAST  Result Date: 03/12/2019 CLINICAL DATA:  Pyelonephritis, sepsis, renal stent, left nephrectomy EXAM: CT ABDOMEN AND PELVIS WITH CONTRAST TECHNIQUE: Multidetector CT imaging of the abdomen and pelvis was performed using the standard protocol following bolus administration of intravenous contrast. CONTRAST:  174m OMNIPAQUE IOHEXOL 300 MG/ML  SOLN COMPARISON:  02/04/2019 FINDINGS: Lower chest: Small bilateral pleural effusions. Coronary artery calcifications and/or stents. Hepatobiliary: No focal liver abnormality is seen. Status post cholecystectomy. No biliary dilatation. Pancreas: Atrophic pancreas with parenchymal calcifications, in keeping with chronic stigmata of pancreatitis. No pancreatic ductal dilatation or surrounding inflammatory changes. Spleen: Splenomegaly. Adrenals/Urinary Tract: Adrenal glands are unremarkable. Redemonstrated postoperative findings of left nephrectomy. There has been interval enlargement of a left retroperitoneal air and fluid collection involving the left psoas, now measuring at least 17.4 x 5.9 cm (series 3, image 53). A previously seen right-sided double-J ureteral stent is been removed. A 1.3 cm calculus remains in the right renal pelvis with additional small  right-sided calyceal calculi. Bladder is unremarkable. Stomach/Bowel: Stomach is within normal limits. No evidence of bowel wall thickening, distention, or inflammatory changes. Large stool balls in the distal sigmoid and rectum. Vascular/Lymphatic: No significant vascular findings are present. Multiple prominent retroperitoneal lymph nodes similar to prior examination. Reproductive: No mass or other significant abnormality. Other: Anasarca. No abdominopelvic ascites. Musculoskeletal: No acute or significant osseous findings. Intramedullary nail fixation of the left femoral neck. IMPRESSION: 1. Status post left nephrectomy. Interval enlargement of left retroperitoneal air and fluid collection involving the left psoas, now measuring at least 17.4 x 5.9 cm and consistent with worsened abscess. 2. Interval removal of right-sided double-J ureteral stent. A 1.3 cm calculus remains in the right renal pelvis with additional small right-sided calyceal calculi. No hydronephrosis. 3. Large stool balls in the distal sigmoid and rectum. 4. Small bilateral pleural effusions. 5. Anasarca. 6. Splenomegaly. 7. Calcific stigmata of chronic pancreatitis. Electronically Signed   By: AEddie CandleM.D.   On: 03/12/2019 23:19   DG Chest Port 1 View  Result Date: 03/12/2019 CLINICAL DATA:  49year old male with chest pain shortness of breath fever nausea vomiting. EXAM: PORTABLE CHEST 1 VIEW COMPARISON:  Portable chest 12/25/2018 and earlier. FINDINGS: Portable AP semi upright view at 2234 hours. Lower lung volumes. Mediastinal contours remain within normal limits. Stable dual lumen right chest dialysis type catheter. Visualized tracheal air column is within normal limits. Crowding of lung markings. No pneumothorax, pleural effusion or consolidation. Increased pulmonary vascular congestion but no overt edema. No acute osseous abnormality identified. IMPRESSION: Lower lung volumes and increased pulmonary vascular congestion since  December. No other acute cardiopulmonary abnormality. Electronically Signed   By: HGenevie AnnM.D.   On: 03/12/2019 22:41    Discussed with hospitalist, Dr. GHurley Cisco- will admit for ongoing care.  Requested consultation with surgical services for recommendations.  Initially discussed with general surgery, Dr. WDonne Hazel- recommended discussion with urology.  Discussed with urology, Dr. MTresa Moore- will see patient in consult in AM but likely recommendation will be repeat drain with IR.  This was communicated to admitting physician.   SLarene Pickett [name] 03/13/19 [address] KDelice Bison DO 03/13/19 [phone]

## 2019-03-14 ENCOUNTER — Ambulatory Visit: Payer: Medicare Other | Admitting: Neurology

## 2019-03-14 ENCOUNTER — Inpatient Hospital Stay (HOSPITAL_COMMUNITY): Payer: Medicare Other

## 2019-03-14 DIAGNOSIS — Z905 Acquired absence of kidney: Secondary | ICD-10-CM

## 2019-03-14 DIAGNOSIS — D696 Thrombocytopenia, unspecified: Secondary | ICD-10-CM

## 2019-03-14 DIAGNOSIS — Z936 Other artificial openings of urinary tract status: Secondary | ICD-10-CM

## 2019-03-14 DIAGNOSIS — N12 Tubulo-interstitial nephritis, not specified as acute or chronic: Secondary | ICD-10-CM

## 2019-03-14 DIAGNOSIS — K861 Other chronic pancreatitis: Secondary | ICD-10-CM

## 2019-03-14 DIAGNOSIS — Z8619 Personal history of other infectious and parasitic diseases: Secondary | ICD-10-CM

## 2019-03-14 DIAGNOSIS — K7460 Unspecified cirrhosis of liver: Secondary | ICD-10-CM

## 2019-03-14 DIAGNOSIS — K6819 Other retroperitoneal abscess: Secondary | ICD-10-CM

## 2019-03-14 DIAGNOSIS — Z888 Allergy status to other drugs, medicaments and biological substances status: Secondary | ICD-10-CM

## 2019-03-14 DIAGNOSIS — Z720 Tobacco use: Secondary | ICD-10-CM

## 2019-03-14 DIAGNOSIS — Z992 Dependence on renal dialysis: Secondary | ICD-10-CM

## 2019-03-14 DIAGNOSIS — N186 End stage renal disease: Secondary | ICD-10-CM

## 2019-03-14 DIAGNOSIS — Z8719 Personal history of other diseases of the digestive system: Secondary | ICD-10-CM

## 2019-03-14 DIAGNOSIS — B961 Klebsiella pneumoniae [K. pneumoniae] as the cause of diseases classified elsewhere: Secondary | ICD-10-CM

## 2019-03-14 DIAGNOSIS — Z1612 Extended spectrum beta lactamase (ESBL) resistance: Secondary | ICD-10-CM

## 2019-03-14 DIAGNOSIS — R7881 Bacteremia: Secondary | ICD-10-CM

## 2019-03-14 DIAGNOSIS — N3090 Cystitis, unspecified without hematuria: Secondary | ICD-10-CM

## 2019-03-14 LAB — CBC
HCT: 25.6 % — ABNORMAL LOW (ref 39.0–52.0)
Hemoglobin: 8.4 g/dL — ABNORMAL LOW (ref 13.0–17.0)
MCH: 30.3 pg (ref 26.0–34.0)
MCHC: 32.8 g/dL (ref 30.0–36.0)
MCV: 92.4 fL (ref 80.0–100.0)
Platelets: 200 10*3/uL (ref 150–400)
RBC: 2.77 MIL/uL — ABNORMAL LOW (ref 4.22–5.81)
RDW: 17 % — ABNORMAL HIGH (ref 11.5–15.5)
WBC: 9.1 10*3/uL (ref 4.0–10.5)
nRBC: 0 % (ref 0.0–0.2)

## 2019-03-14 LAB — GLUCOSE, CAPILLARY
Glucose-Capillary: 118 mg/dL — ABNORMAL HIGH (ref 70–99)
Glucose-Capillary: 165 mg/dL — ABNORMAL HIGH (ref 70–99)
Glucose-Capillary: 173 mg/dL — ABNORMAL HIGH (ref 70–99)
Glucose-Capillary: 181 mg/dL — ABNORMAL HIGH (ref 70–99)
Glucose-Capillary: 209 mg/dL — ABNORMAL HIGH (ref 70–99)
Glucose-Capillary: 214 mg/dL — ABNORMAL HIGH (ref 70–99)
Glucose-Capillary: 248 mg/dL — ABNORMAL HIGH (ref 70–99)
Glucose-Capillary: 297 mg/dL — ABNORMAL HIGH (ref 70–99)

## 2019-03-14 LAB — BASIC METABOLIC PANEL
Anion gap: 12 (ref 5–15)
BUN: 16 mg/dL (ref 6–20)
CO2: 24 mmol/L (ref 22–32)
Calcium: 7.5 mg/dL — ABNORMAL LOW (ref 8.9–10.3)
Chloride: 98 mmol/L (ref 98–111)
Creatinine, Ser: 3.21 mg/dL — ABNORMAL HIGH (ref 0.61–1.24)
GFR calc Af Amer: 25 mL/min — ABNORMAL LOW (ref >60)
GFR calc non Af Amer: 22 mL/min — ABNORMAL LOW (ref >60)
Glucose, Bld: 278 mg/dL — ABNORMAL HIGH (ref 70–99)
Potassium: 2.9 mmol/L — ABNORMAL LOW (ref 3.5–5.1)
Sodium: 134 mmol/L — ABNORMAL LOW (ref 135–145)

## 2019-03-14 LAB — IRON AND TIBC: Iron: 19 ug/dL — ABNORMAL LOW (ref 45–182)

## 2019-03-14 LAB — URINE CULTURE

## 2019-03-14 LAB — FERRITIN: Ferritin: 949 ng/mL — ABNORMAL HIGH (ref 24–336)

## 2019-03-14 MED ORDER — FENTANYL CITRATE (PF) 100 MCG/2ML IJ SOLN
INTRAMUSCULAR | Status: AC | PRN
  Administered 2019-03-14 (×3): 25 ug via INTRAVENOUS

## 2019-03-14 MED ORDER — SODIUM CHLORIDE 0.9% FLUSH
5.0000 mL | Freq: Three times a day (TID) | INTRAVENOUS | Status: DC
  Administered 2019-03-15 – 2019-03-23 (×23): 5 mL

## 2019-03-14 MED ORDER — POTASSIUM CHLORIDE CRYS ER 20 MEQ PO TBCR
40.0000 meq | EXTENDED_RELEASE_TABLET | Freq: Two times a day (BID) | ORAL | Status: AC
  Administered 2019-03-14 (×2): 40 meq via ORAL
  Filled 2019-03-14 (×2): qty 2

## 2019-03-14 MED ORDER — INSULIN DETEMIR 100 UNIT/ML ~~LOC~~ SOLN
8.0000 [IU] | Freq: Every day | SUBCUTANEOUS | Status: DC
  Administered 2019-03-14 – 2019-03-19 (×6): 8 [IU] via SUBCUTANEOUS
  Filled 2019-03-14 (×6): qty 0.08

## 2019-03-14 MED ORDER — FENTANYL CITRATE (PF) 100 MCG/2ML IJ SOLN
INTRAMUSCULAR | Status: AC
  Filled 2019-03-14: qty 4

## 2019-03-14 MED ORDER — MIDAZOLAM HCL 2 MG/2ML IJ SOLN
INTRAMUSCULAR | Status: AC
  Filled 2019-03-14: qty 4

## 2019-03-14 MED ORDER — MIDAZOLAM HCL 2 MG/2ML IJ SOLN
INTRAMUSCULAR | Status: AC | PRN
  Administered 2019-03-14 (×3): 0.5 mg via INTRAVENOUS

## 2019-03-14 NOTE — Plan of Care (Signed)

## 2019-03-14 NOTE — Consult Note (Signed)
Date of Admission:  03/12/2019          Reason for Consult: Retroperitoneal abscess   Referring Provider: Dr. Neysa Bonito   Assessment:  1. Retroperitoneal abscess 2. Hx of recurrent ESBL klebsiella bacteremia (at [facility] in May) and at [place] removal of his HD catheter 3. And single renal disease on hemodialysis 4. Left sided pyelonephritis complicated by emphysematous pyelonephritis with right renal pelvis stone, left-sided radical nephrectomy and right stent performed on 01/09/2019, 5. Retroperitoneal abscess drained in January of 2020 6. Hx of Clostridium difficile colitis 7. History of diabetes mellitus 8. History of chronic pancreatitis 9. Cirrhosis with thrombocytopenia 10. Problems with nephrolithiasis  Plan:  1. Agree with IR guided drainage of abscess 2. Merrem reasonable given history 3. He NEEDS TO HAVE HD CATHTER REMOVED and new one placed 4. If culprit is ESBL Klebsiella again will need a separate line for IV carbapenem 5.   Principal Problem:   Retroperitoneal abscess ([place]) Active Problems:   End stage liver disease (HCC)   Seizures ([place])   Sepsis ([place])   Benign essential HTN   Diabetes mellitus type 2, uncontrolled, with complications ([place])   ESRD (end stage renal disease) on dialysis ([place])   Infection due to ESBL-producing Klebsiella pneumoniae   Scheduled Meds: . allopurinol  100 mg Oral Daily  . calcium acetate  2,001 mg Oral TID WC  . carbamazepine  900 mg Oral BID  . Chlorhexidine Gluconate Cloth  6 each Topical Q0600  . fentaNYL      . ferrous sulfate  325 mg Oral Q breakfast  . FLUoxetine  20 mg Oral Daily  . insulin aspart  0-9 Units Subcutaneous Q4H  . lactulose  20 g Oral BID  . midazolam      . multivitamin with minerals  1 tablet Oral QHS  . pantoprazole  40 mg Oral Daily  . PHENobarbital  259.2 mg Oral QHS  . potassium chloride  40 mEq Oral BID  . simvastatin  20 mg Oral QHS  . tamsulosin  0.4 mg Oral QHS   Continuous  Infusions: . meropenem (MERREM) IV Stopped (03/13/19 2239)   PRN Meds:.acetaminophen **OR** acetaminophen, ondansetron **OR** ondansetron (ZOFRAN) IV  HPI: Eddie Davies is a 49 y.o. male Eddie Davies is a 49 y.o. male with medical history significant of ESRD on HD MWF, ESLD, ESBL.  Eddie Davies has been dealing with persistent, and recurrent infection with ESBL Klebsiella pneumonia cystitis and bacteremia.  His blood cultures cleared but his hemodialysis catheter was placed in the springtime remained in place.  He was then given additional 7 days of ciprofloxacin.  Apparently someone from infectious disease was consulted about him over the phone but is not formally seen by any of [place].  Certainly if I have been called I would have never recommended "treating through an HD catheter" with a MDR GNR such as ESBL seal pneumonia.  November he was also admitted to [facility] with sepsis related to left-sided pyelonephritis then grew the same Klebsiella pneumonia species.  12/26/2018-01/29/2019 Admission to [facility] for sepsis, concern for dialysis cath infection, klebsiella bacteremia, left-sided emphysematous pyelonephritis with right renal pelvis stone, left-sided radical nephrectomy and right stent performed on 01/09/2019,  He had a rocky course with admission to the ICU subsequently in ICU on vasopressors, + Covid test on 01/03/2019,   He appears of only been treated with intravenous antibiotics while as an inpatient and never gone home with IV antibiotics  01/12-01/20/21 Admission to [facility] for retroperitoneal fluid collection, IV abx with IR drain placement 02/06/19, with improvement on repeat imaging  03/07/2019 R ureteral stent removal.  Patient presents to ED from SNF with 2-3 day h/o N/V, unable to keep down solids or liquids.  Does make urine, had hematuria up until 4-5 days ago but this resolved.  Reports some pain "where they took out my kidney".  In the ER is febrile to  over 100 to the CT of abdomen pelvis showed enlarging retroperitoneal abscess   Blood cultures were taken in the evening of the 16th and urine culture taken the 17th. He was since started on merrem  He has now had IR guided aspirate of his abscess performed.  One of the MOST important things we can do for him is REMOVE his HD catheter given recurrent bacteremias w MDR organism and large piece of plastic in his intravascular space for nearly a year.   Review of Systems: Review of Systems  Constitutional: Positive for fever. Negative for chills, diaphoresis, malaise/fatigue and weight loss.  HENT: Negative for congestion, hearing loss, sore throat and tinnitus.   Eyes: Negative for blurred vision and double vision.  Respiratory: Negative for cough, sputum production, shortness of breath and wheezing.   Cardiovascular: Negative for chest pain, palpitations and leg swelling.  Gastrointestinal: Negative for abdominal pain, blood in stool, constipation, diarrhea, heartburn, melena, nausea and vomiting.  Genitourinary: Positive for flank pain. Negative for dysuria and hematuria.  Musculoskeletal: Positive for myalgias. Negative for back pain, falls and joint pain.  Skin: Negative for itching and rash.  Neurological: Negative for dizziness, sensory change, focal weakness, loss of consciousness, weakness and headaches.  Endo/Heme/Allergies: Does not bruise/bleed easily.  Psychiatric/Behavioral: Negative for depression, memory loss and suicidal ideas. The patient is not nervous/anxious.     Past Medical History:  Diagnosis Date  . Cirrhosis ([place]) 04/2018  . Diabetes mellitus without complication (HCC)    type II  . ESRD (end stage renal disease) ([place])    MWF   . Hypertension   . Liver failure ([place])   . Metabolic acidosis 98/3382  . Pneumonia     Social History   Tobacco Use  . Smoking status: Current Some Day Smoker    Packs/day: 0.25    Types: Cigarettes  . Smokeless tobacco:  Never Used  Substance Use Topics  . Alcohol use: Not Currently  . Drug use: Never    History reviewed. No pertinent family history. Allergies  Allergen Reactions  . Fish Oil Swelling and Other (See Comments)         OBJECTIVE: Blood pressure 117/64, pulse 82, temperature 98.2 F (36.8 C), temperature source Oral, resp. rate 18, height 6' 2"  (1.88 m), weight 91.7 kg, SpO2 100 %.  Physical Exam Constitutional:      Appearance: He is obese. He is ill-appearing.  HENT:     Head: Atraumatic.  Skin:    Findings: Rash present. Rash is scaling.  Neurological:     General: No focal deficit present.     Mental Status: He is oriented to person, place, and time.  Psychiatric:        Attention and Perception: Attention normal.        Mood and Affect: Mood is depressed.        Speech: Speech is delayed.        Behavior: Behavior normal. Behavior is cooperative.        Thought Content: Thought content normal.  Lab Results Lab Results  Component Value Date   WBC 9.1 03/14/2019   HGB 8.4 (L) 03/14/2019   HCT 25.6 (L) 03/14/2019   MCV 92.4 03/14/2019   PLT 200 03/14/2019    Lab Results  Component Value Date   CREATININE 3.21 (H) 03/14/2019   BUN 16 03/14/2019   NA 134 (L) 03/14/2019   K 2.9 (L) 03/14/2019   CL 98 03/14/2019   CO2 24 03/14/2019    Lab Results  Component Value Date   ALT 7 03/13/2019   AST 10 (L) 03/13/2019   ALKPHOS 108 03/13/2019   BILITOT 0.4 03/13/2019     Microbiology: Recent Results (from the past 240 hour(s))  Blood Culture (routine x 2)     Status: None (Preliminary result)   Collection Time: 03/12/19  9:21 PM   Specimen: BLOOD  Result Value Ref Range Status   Specimen Description BLOOD RIGHT ANTECUBITAL  Final   Special Requests   Final    BOTTLES DRAWN AEROBIC AND ANAEROBIC Blood Culture adequate volume   Culture   Final    NO GROWTH 2 DAYS Performed at [facility], [address], [place], [place] [address]    Report Status  PENDING  Incomplete  Blood Culture (routine x 2)     Status: None (Preliminary result)   Collection Time: 03/12/19  9:21 PM   Specimen: BLOOD RIGHT HAND  Result Value Ref Range Status   Specimen Description BLOOD RIGHT HAND  Final   Special Requests   Final    BOTTLES DRAWN AEROBIC AND ANAEROBIC Blood Culture adequate volume   Culture   Final    NO GROWTH 2 DAYS Performed at [facility], [place] [address] [place], [place] [address]    Report Status PENDING  Incomplete  Urine culture     Status: Abnormal   Collection Time: 03/13/19  7:24 AM   Specimen: In/Out Cath Urine  Result Value Ref Range Status   Specimen Description IN/OUT CATH URINE  Final   Special Requests   Final    NONE Performed at [facility], [place] [address] [place], [place] [address]    Culture MULTIPLE SPECIES PRESENT, SUGGEST RECOLLECTION (A)  Final   Report Status 03/14/2019 FINAL  Final  Aerobic/Anaerobic Culture (surgical/deep wound)     Status: None (Preliminary result)   Collection Time: 03/14/19 11:01 AM   Specimen: Abscess  Result Value Ref Range Status   Specimen Description ABSCESS  Final   Special Requests NONE  Final   Gram Stain   Final    ABUNDANT WBC PRESENT, PREDOMINANTLY PMN NO ORGANISMS SEEN Performed at [facility], [place] [address] [place],  [address]    Culture PENDING  Incomplete   Report Status PENDING  Incomplete    Alcide Evener, [place] [facility] [phone] pager  03/14/2019, 2:56 PM

## 2019-03-14 NOTE — Progress Notes (Signed)
Inpatient Diabetes Program Recommendations  AACE/ADA: New Consensus Statement on Inpatient Glycemic Control (2015)  Target Ranges:  Prepandial:   less than 140 mg/dL      Peak postprandial:   less than 180 mg/dL (1-2 hours)      Critically ill patients:  140 - 180 mg/dL   Lab Results  Component Value Date   GLUCAP 165 (H) 03/14/2019   HGBA1C 6.5 (H) 03/12/2019    Review of Glycemic Control Results for Eddie Davies, Eddie Davies (MRN [id number]) as of 03/14/2019 11:55  Ref. Range 03/13/2019 19:40 03/14/2019 00:07 03/14/2019 04:31 03/14/2019 08:02  Glucose-Capillary Latest Ref Range: 70 - 99 mg/dL 150 (H) 297 (H) 173 (H) 165 (H)   Diabetes history: DM 2 Outpatient Diabetes medications: Basaglar 20 units, Novolog 0-15 units qid, Tradjenta 5 mg Daily Current orders for Inpatient glycemic control:  Novolog 0-9 Q4H  Inpatient Diabetes Program Recommendations:    Consider adding portion of basal back to regimen, Levemir 8 units QD (to start now).   Thanks, Bronson Curb, MSN, RNC-OB Diabetes Coordinator [phone] (8a-5p)

## 2019-03-14 NOTE — Progress Notes (Signed)
PROGRESS NOTE    Eddie Davies    Code Status: Full Code  ZMO:[id number] DOB: 31-May-1970 DOA: 03/12/2019 LOS: 1 days  PCP: [facility] CC:  Chief Complaint  Patient presents with  . Emesis       Hospital Summary  Eddie Davies is a 49 y.o. male with a history of ESRD on HD, ESBL who was admitted early this morning by Dr. Alcario Drought for retroperitoneal abscess from SNF.  He was recently admitted for left-sided pyelonephritis and sepsis status post left-sided radical nephrectomy and right double-J stent placement at OSH who presented with N/V/fever.  Has since been evaluated by nephrology and patient underwent HD today.   From H&P: Patient has been battling K.Pneumo for past several months, recent admits include:  11/3-11/11/2018 at [facility] for sepsis related to L sided pyelo  12/26/2018-01/29/2019 Admission to [facility] for sepsis, concern for dialysis cath infection, klebsiella bacteremia, left-sided emphysematous pyelonephritis with right renal pelvis stone, left-sided radical nephrectomy and right stent performed on 01/09/2019, subsequently in ICU on vasopressors, + Covid test on 01/03/2019, uncontrolled blood sugars without DKA  01/12-01/20/21 Admission to [facility] for retroperitoneal fluid collection, IV abx with IR drain placement 02/06/19, with improvement on repeat imaging  03/07/2019 R ureteral stent removal.  2/18: Status post CT-guided drain placement x2.  ID consulted.  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 Left retroperitoneal abscess with history of ESBL Klebsiella bacteremia status post CT-guided drain placement x2 via  IR, POD 0 1. Currently on meropenem 2. ID consulted today: Needs to have HD catheter removed and new one placed and will need a separate line for IV Carbapenem if cultures are positive for ESBL Klebsiella 2. 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 ESRD on HD M, W, F  1. had HD 2/17 with 1.5 L UF.  Continue following with nephro 2. Next HD tomorrow 4. Seizure disorder continue Tegretol and phenobarbital 5. Diabetes -add Levemir 8 units daily, continue sliding scale 6. Metabolic bone disease 1. On PhosLo 7. Anemia 1. Was given ESA as outpatient 2. neprho repeated iron studies 8. Hypertension continue current regimen and volume removal per nephro 9. Chronic nonobstructing right urolithiasis -stable, urology on board 10. Hypokalemia -repleted by nephro 11. Hyperlipidemia -on statin   DVT prophylaxis: SCDs Family Communication: No family at bedside Disposition Plan:   Patient came from:   SNF                                                                                          Anticipated d/c place: TBD  Barriers to d/c: Medical stability, consultants data  Pressure injury documentation   Pressure Injury 04/21/18 Stage II -  Partial thickness loss of dermis presenting as a shallow open ulcer with a red, pink wound bed without  slough. redness, possible previous injury (Active)  04/21/18 2200  Location: Coccyx  Location Orientation: Medial  Staging: Stage II -  Partial thickness loss of dermis presenting as a shallow open ulcer with a red, pink wound bed without slough.  Wound Description (Comments): redness, possible previous injury  Present on Admission: Yes     Consultants  ID Neurology Nephrology IR  Procedures  2/17 HD, on MWF schedule 2/18 CT-guided drain placement x2  Antibiotics   Anti-infectives (From admission, onward)   Start     Dose/Rate Route Frequency Ordered Stop   03/13/19 2200  meropenem (MERREM) 500 mg in sodium chloride 0.9 % 100 mL IVPB     500 mg 200 mL/hr  over 30 Minutes Intravenous Every 24 hours 03/12/19 2210     03/12/19 2215  piperacillin-tazobactam (ZOSYN) IVPB 3.375 g  Status:  Discontinued     3.375 g 12.5 mL/hr over 240 Minutes Intravenous  Once 03/12/19 2205 03/12/19 2208   03/12/19 2215  meropenem (MERREM) 1 g in sodium chloride 0.9 % 100 mL IVPB     1 g 200 mL/hr over 30 Minutes Intravenous  Once 03/12/19 2210 03/13/19 0223        Subjective   Admits that he has some low back pain related to his recent procedure as well as abscess but otherwise denies any complaints  Objective   Vitals:   03/14/19 1045 03/14/19 1050 03/14/19 1105 03/14/19 1325  BP: (!) 94/59 99/60 107/63 117/64  Pulse: 85 86 85 82  Resp: 18 18 17 18   Temp:    98.2 F (36.8 C)  TempSrc:    Oral  SpO2: 100% 97% 97% 100%  Weight:      Height:        Intake/Output Summary (Last 24 hours) at 03/14/2019 1743 Last data filed at 03/14/2019 1634 Gross per 24 hour  Intake 200 ml  Output 585 ml  Net -385 ml   Filed Weights   03/12/19 2048 03/13/19 1220 03/13/19 1640  Weight: 90.7 kg 93.4 kg 91.7 kg    Examination:  Physical Exam Vitals and nursing note reviewed.  Constitutional:      Appearance: Normal appearance.  HENT:     Head: Normocephalic and atraumatic.  Eyes:     Conjunctiva/sclera: Conjunctivae normal.  Cardiovascular:     Rate and Rhythm: Normal rate and regular rhythm.  Pulmonary:     Effort: Pulmonary effort is normal.     Breath sounds: Normal breath sounds.  Abdominal:     General: Abdomen is flat.     Palpations: Abdomen is soft.  Musculoskeletal:     Comments: 2 drains with/serosanguineous drainage, no purulence  Skin:    Coloration: Skin is not jaundiced or pale.  Neurological:     Mental Status: He is alert. Mental status is at baseline.  Psychiatric:        Mood and Affect: Mood normal.        Behavior: Behavior normal.     Data Reviewed: I have personally reviewed following labs and imaging  studies  CBC: Recent Labs  Lab 03/12/19 2331 03/13/19 0423 03/14/19 0154  WBC 8.5 8.5 9.1  NEUTROABS 7.0  --   --   HGB 8.3* 8.2* 8.4*  HCT 25.9* 25.3* 25.6*  MCV 94.2 93.0 92.4  PLT 228 216 268   Basic Metabolic Panel: Recent Labs  Lab 03/12/19 2051 03/12/19 2331 03/13/19 0423 03/13/19 1230 03/14/19 0154  NA 140  --  138 136  134*  K 2.6*  --  2.7* 3.2* 2.9*  CL 104  --  101 103 98  CO2 23  --  21* 21* 24  GLUCOSE 59*  --  81 102* 278*  BUN 40*  --  41* 43* 16  CREATININE 5.72*  --  5.85* 5.91* 3.21*  CALCIUM 7.7*  --  7.5* 7.8* 7.5*  MG  --  1.8  --   --   --   PHOS  --   --   --  5.6*  --    GFR: Estimated Creatinine Clearance: 32.7 mL/min (A) (by C-G formula based on SCr of 3.21 mg/dL (H)). Liver Function Tests: Recent Labs  Lab 03/12/19 2051 03/13/19 0423 03/13/19 1230  AST 10* 10*  --   ALT 8 7  --   ALKPHOS 124 108  --   BILITOT 0.8 0.4  --   PROT 6.6 5.6*  --   ALBUMIN 1.8* 1.5* 1.5*   Recent Labs  Lab 03/12/19 2051  LIPASE 13   Recent Labs  Lab 03/12/19 2051  AMMONIA 61*   Coagulation Profile: Recent Labs  Lab 03/12/19 2331  INR 1.7*   Cardiac Enzymes: No results for input(s): CKTOTAL, CKMB, CKMBINDEX, TROPONINI in the last 168 hours. BNP (last 3 results) No results for input(s): PROBNP in the last 8760 hours. HbA1C: Recent Labs    03/12/19 2331  HGBA1C 6.5*   CBG: Recent Labs  Lab 03/14/19 0007 03/14/19 0431 03/14/19 0802 03/14/19 1239 03/14/19 1623  GLUCAP 297* 173* 165* 118* 214*   Lipid Profile: No results for input(s): CHOL, HDL, LDLCALC, TRIG, CHOLHDL, LDLDIRECT in the last 72 hours. Thyroid Function Tests: No results for input(s): TSH, T4TOTAL, FREET4, T3FREE, THYROIDAB in the last 72 hours. Anemia Panel: Recent Labs    03/14/19 1444  FERRITIN 949*  TIBC NOT CALCULATED  IRON 19*   Sepsis Labs: Recent Labs  Lab 03/12/19 2051 03/13/19 1139  LATICACIDVEN 0.8 0.8    Recent Results (from the past 240  hour(s))  Blood Culture (routine x 2)     Status: None (Preliminary result)   Collection Time: 03/12/19  9:21 PM   Specimen: BLOOD  Result Value Ref Range Status   Specimen Description BLOOD RIGHT ANTECUBITAL  Final   Special Requests   Final    BOTTLES DRAWN AEROBIC AND ANAEROBIC Blood Culture adequate volume   Culture   Final    NO GROWTH 2 DAYS Performed at [facility], [place] [address] [place], [place] [address]    Report Status PENDING  Incomplete  Blood Culture (routine x 2)     Status: None (Preliminary result)   Collection Time: 03/12/19  9:21 PM   Specimen: BLOOD RIGHT HAND  Result Value Ref Range Status   Specimen Description BLOOD RIGHT HAND  Final   Special Requests   Final    BOTTLES DRAWN AEROBIC AND ANAEROBIC Blood Culture adequate volume   Culture   Final    NO GROWTH 2 DAYS Performed at [facility], [place] [address], [place], [place] [address]    Report Status PENDING  Incomplete  Urine culture     Status: Abnormal   Collection Time: 03/13/19  7:24 AM   Specimen: In/Out Cath Urine  Result Value Ref Range Status   Specimen Description IN/OUT CATH URINE  Final   Special Requests   Final    NONE Performed at [facility], [place] [address], [place], [place]  [address]    Culture MULTIPLE SPECIES PRESENT, SUGGEST RECOLLECTION (A)  Final   Report Status 03/14/2019 FINAL  Final  Aerobic/Anaerobic Culture (surgical/deep wound)     Status: None (Preliminary result)   Collection Time: 03/14/19 11:01 AM   Specimen: Abscess  Result Value Ref Range Status   Specimen Description ABSCESS  Final   Special Requests NONE  Final   Gram Stain   Final    ABUNDANT WBC PRESENT, PREDOMINANTLY PMN NO ORGANISMS SEEN Performed at [facility], [place] [address], [place], [place] [address]    Culture PENDING  Incomplete   Report Status PENDING  Incomplete         Radiology Studies: CT ABDOMEN PELVIS W CONTRAST  Result Date: 03/12/2019 CLINICAL DATA:   Pyelonephritis, sepsis, renal stent, left nephrectomy EXAM: CT ABDOMEN AND PELVIS WITH CONTRAST TECHNIQUE: Multidetector CT imaging of the abdomen and pelvis was performed using the standard protocol following bolus administration of intravenous contrast. CONTRAST:  182m OMNIPAQUE IOHEXOL 300 MG/ML  SOLN COMPARISON:  02/04/2019 FINDINGS: Lower chest: Small bilateral pleural effusions. Coronary artery calcifications and/or stents. Hepatobiliary: No focal liver abnormality is seen. Status post cholecystectomy. No biliary dilatation. Pancreas: Atrophic pancreas with parenchymal calcifications, in keeping with chronic stigmata of pancreatitis. No pancreatic ductal dilatation or surrounding inflammatory changes. Spleen: Splenomegaly. Adrenals/Urinary Tract: Adrenal glands are unremarkable. Redemonstrated postoperative findings of left nephrectomy. There has been interval enlargement of a left retroperitoneal air and fluid collection involving the left psoas, now measuring at least 17.4 x 5.9 cm (series 3, image 53). A previously seen right-sided double-J ureteral stent is been removed. A 1.3 cm calculus remains in the right renal pelvis with additional small right-sided calyceal calculi. Bladder is unremarkable. Stomach/Bowel: Stomach is within normal limits. No evidence of bowel wall thickening, distention, or inflammatory changes. Large stool balls in the distal sigmoid and rectum. Vascular/Lymphatic: No significant vascular findings are present. Multiple prominent retroperitoneal lymph nodes similar to prior examination. Reproductive: No mass or other significant abnormality. Other: Anasarca. No abdominopelvic ascites. Musculoskeletal: No acute or significant osseous findings. Intramedullary nail fixation of the left femoral neck. IMPRESSION: 1. Status post left nephrectomy. Interval enlargement of left retroperitoneal air and fluid collection involving the left psoas, now measuring at least 17.4 x 5.9 cm and  consistent with worsened abscess. 2. Interval removal of right-sided double-J ureteral stent. A 1.3 cm calculus remains in the right renal pelvis with additional small right-sided calyceal calculi. No hydronephrosis. 3. Large stool balls in the distal sigmoid and rectum. 4. Small bilateral pleural effusions. 5. Anasarca. 6. Splenomegaly. 7. Calcific stigmata of chronic pancreatitis. Electronically Signed   By: AEddie CandleM.D.   On: 03/12/2019 23:19   DG Chest Port 1 View  Result Date: 03/12/2019 CLINICAL DATA:  49year old male with chest pain shortness of breath fever nausea vomiting. EXAM: PORTABLE CHEST 1 VIEW COMPARISON:  Portable chest 12/25/2018 and earlier. FINDINGS: Portable AP semi upright view at 2234 hours. Lower lung volumes. Mediastinal contours remain within normal limits. Stable dual lumen right chest dialysis type catheter. Visualized tracheal air column is within normal limits. Crowding of lung markings. No pneumothorax, pleural effusion or consolidation. Increased pulmonary vascular congestion but no overt edema. No acute osseous abnormality identified. IMPRESSION: Lower lung volumes and increased pulmonary vascular congestion since December. No other acute cardiopulmonary abnormality. Electronically Signed   By: HGenevie AnnM.D.   On: 03/12/2019 22:41   CT IMAGE GUIDED FLUID DRAIN BY CATHETER  Result Date: 03/14/2019  INDICATION: 49 year old with history of nephrectomy and recurrent left retroperitoneal abscess. EXAM: CT-GUIDED PLACEMENT OF LEFT RETROPERITONEAL ABSCESS DRAIN X2 MEDICATIONS: The patient is currently admitted to the hospital and receiving intravenous antibiotics. ANESTHESIA/SEDATION: Fentanyl 50 mcg IV; Versed 1.0 mg IV Moderate Sedation Time:  45 minutes The patient was continuously monitored during the procedure by the interventional radiology nurse under my direct supervision. COMPLICATIONS: None immediate. PROCEDURE: Informed written consent was obtained from the patient  after a thorough discussion of the procedural risks, benefits and alternatives. All questions were addressed. Maximal Sterile Barrier Technique was utilized including caps, mask, sterile gowns, sterile gloves, sterile drape, hand hygiene and skin antiseptic. A timeout was performed prior to the initiation of the procedure. Patient was placed prone. CT images through the abdomen were obtained. Large air-fluid collection in the left retroperitoneum. The left flank was prepped with chlorhexidine and sterile field was created. Skin and soft tissues were anesthetized with 1% lidocaine. Using CT guidance, 18 gauge trocar needle was directed into the large collection. Yellow purulent fluid was aspirated. Superstiff Amplatz wire was placed through the needle and the tract was dilated to accommodate a 14 [place] biliary type drain. Large amount of yellow purulent fluid was removed. Follow up CT images were obtained. CT images demonstrated residual air-fluid collection posterior to the spleen and upper portion of the collection. Skin was anesthetized with 1% lidocaine and a new incision was made. New 18 gauge trocar needle was directed into the upper portion of the residual collection with CT guidance. Large amount of air was aspirated. Superstiff Amplatz wire was advanced into the collection and the tract was dilated to accommodate a 12 [place] multipurpose drain. Large amount of gas and bloody purulent fluid was aspirated. Follow up CT images were obtained. Both catheters sutured to skin and attached to a suction bulb. StatLock was placed. FINDINGS: Large complex air-fluid collection in the left retroperitoneum with some extension into the left lateral subcutaneous tissues. A 14 French biliary type drain was placed in the midportion of the collection. 430 mL of yellow purulent fluid was removed from this drain. Follow up CT images demonstrated residual air-fluid collection along the superior aspect of the collection posterior  to the spleen. Second drain was placed in the superior aspect of the collection and 100 mL purulent fluid and a large amount of gas was removed. Final CT images demonstrate marked decompression of the abscess cavities. A small amount of residual gas and fluid. Again noted is high-density material in the right renal collecting system compatible with nephrolithiasis. There is a focal stone at the right UPJ that measures roughly 1.3 cm. IMPRESSION: CT-guided placement of two percutaneous drains within the large left retroperitoneal abscess. Total of 530 mL of purulent fluid was removed from the abscess. Fluid was sent for culture. Patient will need follow-up CT images when the drainage decreases to look for any residual collections. Electronically Signed   By: Markus Daft M.D.   On: 03/14/2019 14:10   CT IMAGE GUIDED DRAINAGE BY PERCUTANEOUS CATHETER  Result Date: 03/14/2019 INDICATION: 49 year old with history of nephrectomy and recurrent left retroperitoneal abscess. EXAM: CT-GUIDED PLACEMENT OF LEFT RETROPERITONEAL ABSCESS DRAIN X2 MEDICATIONS: The patient is currently admitted to the hospital and receiving intravenous antibiotics. ANESTHESIA/SEDATION: Fentanyl 50 mcg IV; Versed 1.0 mg IV Moderate Sedation Time:  45 minutes The patient was continuously monitored during the procedure by the interventional radiology nurse under my direct supervision. COMPLICATIONS: None immediate. PROCEDURE: Informed written consent was obtained from the  patient after a thorough discussion of the procedural risks, benefits and alternatives. All questions were addressed. Maximal Sterile Barrier Technique was utilized including caps, mask, sterile gowns, sterile gloves, sterile drape, hand hygiene and skin antiseptic. A timeout was performed prior to the initiation of the procedure. Patient was placed prone. CT images through the abdomen were obtained. Large air-fluid collection in the left retroperitoneum. The left flank was prepped  with chlorhexidine and sterile field was created. Skin and soft tissues were anesthetized with 1% lidocaine. Using CT guidance, 18 gauge trocar needle was directed into the large collection. Yellow purulent fluid was aspirated. Superstiff Amplatz wire was placed through the needle and the tract was dilated to accommodate a 14 [place] biliary type drain. Large amount of yellow purulent fluid was removed. Follow up CT images were obtained. CT images demonstrated residual air-fluid collection posterior to the spleen and upper portion of the collection. Skin was anesthetized with 1% lidocaine and a new incision was made. New 18 gauge trocar needle was directed into the upper portion of the residual collection with CT guidance. Large amount of air was aspirated. Superstiff Amplatz wire was advanced into the collection and the tract was dilated to accommodate a 12 [place] multipurpose drain. Large amount of gas and bloody purulent fluid was aspirated. Follow up CT images were obtained. Both catheters sutured to skin and attached to a suction bulb. StatLock was placed. FINDINGS: Large complex air-fluid collection in the left retroperitoneum with some extension into the left lateral subcutaneous tissues. A 14 French biliary type drain was placed in the midportion of the collection. 430 mL of yellow purulent fluid was removed from this drain. Follow up CT images demonstrated residual air-fluid collection along the superior aspect of the collection posterior to the spleen. Second drain was placed in the superior aspect of the collection and 100 mL purulent fluid and a large amount of gas was removed. Final CT images demonstrate marked decompression of the abscess cavities. A small amount of residual gas and fluid. Again noted is high-density material in the right renal collecting system compatible with nephrolithiasis. There is a focal stone at the right UPJ that measures roughly 1.3 cm. IMPRESSION: CT-guided placement of two  percutaneous drains within the large left retroperitoneal abscess. Total of 530 mL of purulent fluid was removed from the abscess. Fluid was sent for culture. Patient will need follow-up CT images when the drainage decreases to look for any residual collections. Electronically Signed   By: Markus Daft M.D.   On: 03/14/2019 14:10        Scheduled Meds: . allopurinol  100 mg Oral Daily  . calcium acetate  2,001 mg Oral TID WC  . carbamazepine  900 mg Oral BID  . Chlorhexidine Gluconate Cloth  6 each Topical Q0600  . fentaNYL      . ferrous sulfate  325 mg Oral Q breakfast  . FLUoxetine  20 mg Oral Daily  . insulin aspart  0-9 Units Subcutaneous Q4H  . lactulose  20 g Oral BID  . midazolam      . multivitamin with minerals  1 tablet Oral QHS  . pantoprazole  40 mg Oral Daily  . PHENobarbital  259.2 mg Oral QHS  . potassium chloride  40 mEq Oral BID  . simvastatin  20 mg Oral QHS  . tamsulosin  0.4 mg Oral QHS   Continuous Infusions: . meropenem (MERREM) IV Stopped (03/13/19 2239)     Time spent: 26 minutes with over  50% of the time coordinating the patient's care    Harold Hedge, DO [facility] Pager [phone]  Call night coverage person covering after 7pm

## 2019-03-14 NOTE — Progress Notes (Addendum)
[facility] Progress Note   Subjective:   Seen in room - looks comfortable. S/p IR drain x 2 to abscess his AM - ~545m pus removed, currently draining is bloody, cloudy fluid. Denies CP or dyspnea. S/p HD yesterday - 1.5L UF without issues.  Objective Vitals:   03/14/19 1040 03/14/19 1045 03/14/19 1050 03/14/19 1105  BP: 95/60 (!) 94/59 99/60 107/63  Pulse: 86 85 86 85  Resp: 19 18 18 17   Temp:      TempSrc:      SpO2: 100% 100% 97% 97%  Weight:      Height:       Physical Exam General: Overweight man, NAD. Skin very dry. Heart: RRR; no murmur Lungs: CTAB Abdomen: soft, non-tender. Two drains in L flank - cloudy, bloody output  Extremities: No LE edema Dialysis Access:  TDC + RUE AVF + thrill  Additional Objective Labs: Basic Metabolic Panel: Recent Labs  Lab 03/13/19 0423 03/13/19 1230 03/14/19 0154  NA 138 136 134*  K 2.7* 3.2* 2.9*  CL 101 103 98  CO2 21* 21* 24  GLUCOSE 81 102* 278*  BUN 41* 43* 16  CREATININE 5.85* 5.91* 3.21*  CALCIUM 7.5* 7.8* 7.5*  PHOS  --  5.6*  --    Liver Function Tests: Recent Labs  Lab 03/12/19 2051 03/13/19 0423 03/13/19 1230  AST 10* 10*  --   ALT 8 7  --   ALKPHOS 124 108  --   BILITOT 0.8 0.4  --   PROT 6.6 5.6*  --   ALBUMIN 1.8* 1.5* 1.5*   Recent Labs  Lab 03/12/19 2051  LIPASE 13   CBC: Recent Labs  Lab 03/12/19 2331 03/13/19 0423 03/14/19 0154  WBC 8.5 8.5 9.1  NEUTROABS 7.0  --   --   HGB 8.3* 8.2* 8.4*  HCT 25.9* 25.3* 25.6*  MCV 94.2 93.0 92.4  PLT 228 216 200   Blood Culture    Component Value Date/Time   SDES IN/OUT CATH URINE 03/13/2019 0724   SPECREQUEST  03/13/2019 0724    NONE Performed at [facility] [address], [place]  [id number]   CULT MULTIPLE SPECIES PRESENT, SUGGEST RECOLLECTION (A) 03/13/2019 0724   REPTSTATUS 03/14/2019 FINAL 03/13/2019 0724   Studies/Results: CT ABDOMEN PELVIS W CONTRAST  Result Date: 03/12/2019 CLINICAL DATA:   Pyelonephritis, sepsis, renal stent, left nephrectomy EXAM: CT ABDOMEN AND PELVIS WITH CONTRAST TECHNIQUE: Multidetector CT imaging of the abdomen and pelvis was performed using the standard protocol following bolus administration of intravenous contrast. CONTRAST:  1037mOMNIPAQUE IOHEXOL 300 MG/ML  SOLN COMPARISON:  02/04/2019 FINDINGS: Lower chest: Small bilateral pleural effusions. Coronary artery calcifications and/or stents. Hepatobiliary: No focal liver abnormality is seen. Status post cholecystectomy. No biliary dilatation. Pancreas: Atrophic pancreas with parenchymal calcifications, in keeping with chronic stigmata of pancreatitis. No pancreatic ductal dilatation or surrounding inflammatory changes. Spleen: Splenomegaly. Adrenals/Urinary Tract: Adrenal glands are unremarkable. Redemonstrated postoperative findings of left nephrectomy. There has been interval enlargement of a left retroperitoneal air and fluid collection involving the left psoas, now measuring at least 17.4 x 5.9 cm (series 3, image 53). A previously seen right-sided double-J ureteral stent is been removed. A 1.3 cm calculus remains in the right renal pelvis with additional small right-sided calyceal calculi. Bladder is unremarkable. Stomach/Bowel: Stomach is within normal limits. No evidence of bowel wall thickening, distention, or inflammatory changes. Large stool balls in the distal sigmoid and rectum. Vascular/Lymphatic: No significant vascular  findings are present. Multiple prominent retroperitoneal lymph nodes similar to prior examination. Reproductive: No mass or other significant abnormality. Other: Anasarca. No abdominopelvic ascites. Musculoskeletal: No acute or significant osseous findings. Intramedullary nail fixation of the left femoral neck. IMPRESSION: 1. Status post left nephrectomy. Interval enlargement of left retroperitoneal air and fluid collection involving the left psoas, now measuring at least 17.4 x 5.9 cm and  consistent with worsened abscess. 2. Interval removal of right-sided double-J ureteral stent. A 1.3 cm calculus remains in the right renal pelvis with additional small right-sided calyceal calculi. No hydronephrosis. 3. Large stool balls in the distal sigmoid and rectum. 4. Small bilateral pleural effusions. 5. Anasarca. 6. Splenomegaly. 7. Calcific stigmata of chronic pancreatitis. Electronically Signed   By: Eddie Candle M.D.   On: 03/12/2019 23:19   DG Chest Port 1 View  Result Date: 03/12/2019 CLINICAL DATA:  49 year old male with chest pain shortness of breath fever nausea vomiting. EXAM: PORTABLE CHEST 1 VIEW COMPARISON:  Portable chest 12/25/2018 and earlier. FINDINGS: Portable AP semi upright view at 2234 hours. Lower lung volumes. Mediastinal contours remain within normal limits. Stable dual lumen right chest dialysis type catheter. Visualized tracheal air column is within normal limits. Crowding of lung markings. No pneumothorax, pleural effusion or consolidation. Increased pulmonary vascular congestion but no overt edema. No acute osseous abnormality identified. IMPRESSION: Lower lung volumes and increased pulmonary vascular congestion since December. No other acute cardiopulmonary abnormality. Electronically Signed   By: Genevie Ann M.D.   On: 03/12/2019 22:41   Medications: . meropenem (MERREM) IV Stopped (03/13/19 2239)   . allopurinol  100 mg Oral Daily  . calcium acetate  2,001 mg Oral TID WC  . carbamazepine  900 mg Oral BID  . Chlorhexidine Gluconate Cloth  6 each Topical Q0600  . fentaNYL      . ferrous sulfate  325 mg Oral Q breakfast  . FLUoxetine  20 mg Oral Daily  . insulin aspart  0-9 Units Subcutaneous Q4H  . lactulose  20 g Oral BID  . midazolam      . multivitamin with minerals  1 tablet Oral QHS  . pantoprazole  40 mg Oral Daily  . PHENobarbital  259.2 mg Oral QHS  . simvastatin  20 mg Oral QHS  . sodium bicarbonate  650 mg Oral TID  . tamsulosin  0.4 mg Oral QHS     Dialysis Orders: MWF at [facility] 4:15hr, 400/800, EDW 94kg, 3K/2.25Ca, TDC, heparin 3000 bolus - Mircera 190mg IV q 2 weeks (2/10)  Assessment/Plan: 1. 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 Urology consulted - not felt to be candidate for open wash-out, S/p IR drain and Meropenem. ID consult pending. 2.  ESRD: Will continue HD on MWF schedule - next HD 2/19. Used 4K bath for prior hypoK - remains low - for another PO 411m BID today. Will d/c PO bicarb. 3.  Hypertension/volume: BP low/stable - no excess volume on exam. 4.  Anemia: Hgb 8.4 - just given ESA as outpt - follow for now. Repeat iron studies. 5.  Metabolic bone disease: CorrCa ok, borderline Phos - continue home binders (Phoslo). 6.  T2DM 7.  R kidney stone: Per CT - non-obstructing at the moment, asymptomatic.  KaVeneta PentonPA-C 03/14/2019, 12:18 PM  [facility] Pager: [phone]

## 2019-03-14 NOTE — Progress Notes (Signed)
Subjective/Chief Complaint:   1 - Left Retroperitoneal Abscess - 17cm mixed fluid, gas retroperitoneal abscess / fluid collection by CT 02/2019 on eval fevers/malise/flank pain. He is s/p left open flank nephrectomy at [facility]12/2020 for infected kidney complicated by recurrent fluid collections. Had Drain in place at [facility] 01/2019 and subsequently removed. WBC 8.5. Fevers to 103. Prior UCX 11/2018 Klebsiella sens zosyn, imipenem. Placed on empiric Merrem 2/16. FInal CX klebsiella again seens cipro, some cephs and kept on Merrem.   2 -  End-Stage Renal Disease - on hemnodialysis via Rt SVC cath for medical renal disease.   3 -  Urolithiasis - chronic non-obstructing Rt 54m renal pelvis stone on imaging x many, stable by CT 02/2019  PMH sig for cirrhosis, DM2 (A1c 6's), appy, hernia repair. [place] " [place]" is stable. Fevers and infectious parameters stable. Abscess drain output trending down overall but persistent.    Objective:  Intake/Output from previous day: 02/17 0701 - 02/18 0700 In: 200 [IV Piggyback:200] Out: 1619 [Urine:25] Intake/Output this shift: No intake/output data recorded.  Physical Exam  Constitutional:  Appears much older than stated age. Icteric.   Cardiovascular: Normal rate.  Respiratory: Effort normal.  GI:  Large truncal obesity. Some ascites.   Genitourinary:    Genitourinary Comments: Left flank incision well healed w/o drainage. Some tenderness, no erythema / site reaction.   JP drain x 2 in place with non-foul serosanguinous fluid at this point.  Musculoskeletal:     Cervical back: Normal range of motion.  Neurological: He is alert.  Skin: Skin is warm.  Psychiatric: He has a normal mood and affect.    Lab Results:  Recent Labs    03/13/19 0423 03/14/19 0154  WBC 8.5 9.1  HGB 8.2* 8.4*  HCT 25.3* 25.6*  PLT 216 200   BMET Recent Labs    03/13/19 1230 03/14/19 0154  NA 136 134*  K 3.2* 2.9*  CL 103 98   CO2 21* 24  GLUCOSE 102* 278*  BUN 43* 16  CREATININE 5.91* 3.21*  CALCIUM 7.8* 7.5*   PT/INR Recent Labs    03/12/19 2331  LABPROT 20.0*  INR 1.7*   ABG No results for input(s): PHART, HCO3 in the last 72 hours.  Invalid input(s): PCO2, PO2  Studies/Results: CT ABDOMEN PELVIS W CONTRAST  Result Date: 03/12/2019 CLINICAL DATA:  Pyelonephritis, sepsis, renal stent, left nephrectomy EXAM: CT ABDOMEN AND PELVIS WITH CONTRAST TECHNIQUE: Multidetector CT imaging of the abdomen and pelvis was performed using the standard protocol following bolus administration of intravenous contrast. CONTRAST:  1019mOMNIPAQUE IOHEXOL 300 MG/ML  SOLN COMPARISON:  02/04/2019 FINDINGS: Lower chest: Small bilateral pleural effusions. Coronary artery calcifications and/or stents. Hepatobiliary: No focal liver abnormality is seen. Status post cholecystectomy. No biliary dilatation. Pancreas: Atrophic pancreas with parenchymal calcifications, in keeping with chronic stigmata of pancreatitis. No pancreatic ductal dilatation or surrounding inflammatory changes. Spleen: Splenomegaly. Adrenals/Urinary Tract: Adrenal glands are unremarkable. Redemonstrated postoperative findings of left nephrectomy. There has been interval enlargement of a left retroperitoneal air and fluid collection involving the left psoas, now measuring at least 17.4 x 5.9 cm (series 3, image 53). A previously seen right-sided double-J ureteral stent is been removed. A 1.3 cm calculus remains in the right renal pelvis with additional small right-sided calyceal calculi. Bladder is unremarkable. Stomach/Bowel: Stomach is within normal limits. No evidence of bowel wall thickening, distention, or inflammatory changes. Large stool balls in the distal sigmoid and rectum. Vascular/Lymphatic:  No significant vascular findings are present. Multiple prominent retroperitoneal lymph nodes similar to prior examination. Reproductive: No mass or other significant  abnormality. Other: Anasarca. No abdominopelvic ascites. Musculoskeletal: No acute or significant osseous findings. Intramedullary nail fixation of the left femoral neck. IMPRESSION: 1. Status post left nephrectomy. Interval enlargement of left retroperitoneal air and fluid collection involving the left psoas, now measuring at least 17.4 x 5.9 cm and consistent with worsened abscess. 2. Interval removal of right-sided double-J ureteral stent. A 1.3 cm calculus remains in the right renal pelvis with additional small right-sided calyceal calculi. No hydronephrosis. 3. Large stool balls in the distal sigmoid and rectum. 4. Small bilateral pleural effusions. 5. Anasarca. 6. Splenomegaly. 7. Calcific stigmata of chronic pancreatitis. Electronically Signed   By: Eddie Candle M.D.   On: 03/12/2019 23:19   DG Chest Port 1 View  Result Date: 03/12/2019 CLINICAL DATA:  49 year old male with chest pain shortness of breath fever nausea vomiting. EXAM: PORTABLE CHEST 1 VIEW COMPARISON:  Portable chest 12/25/2018 and earlier. FINDINGS: Portable AP semi upright view at 2234 hours. Lower lung volumes. Mediastinal contours remain within normal limits. Stable dual lumen right chest dialysis type catheter. Visualized tracheal air column is within normal limits. Crowding of lung markings. No pneumothorax, pleural effusion or consolidation. Increased pulmonary vascular congestion but no overt edema. No acute osseous abnormality identified. IMPRESSION: Lower lung volumes and increased pulmonary vascular congestion since December. No other acute cardiopulmonary abnormality. Electronically Signed   By: Genevie Ann M.D.   On: 03/12/2019 22:41    Anti-infectives: Anti-infectives (From admission, onward)   Start     Dose/Rate Route Frequency Ordered Stop   03/13/19 2200  meropenem (MERREM) 500 mg in sodium chloride 0.9 % 100 mL IVPB     500 mg 200 mL/hr over 30 Minutes Intravenous Every 24 hours 03/12/19 2210     03/12/19 2215   piperacillin-tazobactam (ZOSYN) IVPB 3.375 g  Status:  Discontinued     3.375 g 12.5 mL/hr over 240 Minutes Intravenous  Once 03/12/19 2205 03/12/19 2208   03/12/19 2215  meropenem (MERREM) 1 g in sodium chloride 0.9 % 100 mL IVPB     1 g 200 mL/hr over 30 Minutes Intravenous  Once 03/12/19 2210 03/13/19 0223      Assessment/Plan:  1 - Left Retroperitoneal Abscess - Again, this is very diffuclt problem in highly comorbid patient with multiple factors working agianst him. Still feel aggressive perc drain into collection  ABX. Consider transition to even PO Cipro which he could continue at DC. Rec drains stay in place until output remains >60m per day. Certainly can be discharged with them and [place]   2 -  End-Stage Renal Disease - per neprhlology.   3 -  Urolithiasis - stable non-obstructing rt renal stone, observe.    TAlexis Frock2/18/2021

## 2019-03-14 NOTE — Procedures (Signed)
Interventional Radiology Procedure:   Indications: History of left nephrectomy with recurrent large left flank abscess  Procedure: CT guided drain placement x 2  Findings: Placed 14 Fr biliary drain in lower part of collection.  Placed 12 fr drain in upper portion.  Removed greater than 500 ml of yellow purulent fluid.  Majority of collection decompressed after drain placement  Complications: none     EBL: less than 20 ml  Plan: Send fluid for culture.  Will needs f/u CT in a few days.  Only area that may need additional drainage is in left subcutaneous tissues.     Durelle Zepeda R. Anselm Pancoast, MD  Pager: [phone]

## 2019-03-15 ENCOUNTER — Inpatient Hospital Stay (HOSPITAL_COMMUNITY): Payer: Medicare Other

## 2019-03-15 HISTORY — PX: IR REMOVAL TUN CV CATH W/O FL: IMG2289

## 2019-03-15 LAB — CBC
HCT: 25.1 % — ABNORMAL LOW (ref 39.0–52.0)
Hemoglobin: 8 g/dL — ABNORMAL LOW (ref 13.0–17.0)
MCH: 30.4 pg (ref 26.0–34.0)
MCHC: 31.9 g/dL (ref 30.0–36.0)
MCV: 95.4 fL (ref 80.0–100.0)
Platelets: 198 10*3/uL (ref 150–400)
RBC: 2.63 MIL/uL — ABNORMAL LOW (ref 4.22–5.81)
RDW: 17.1 % — ABNORMAL HIGH (ref 11.5–15.5)
WBC: 6 10*3/uL (ref 4.0–10.5)
nRBC: 0 % (ref 0.0–0.2)

## 2019-03-15 LAB — RENAL FUNCTION PANEL
Albumin: 1.5 g/dL — ABNORMAL LOW (ref 3.5–5.0)
Anion gap: 12 (ref 5–15)
BUN: 27 mg/dL — ABNORMAL HIGH (ref 6–20)
CO2: 23 mmol/L (ref 22–32)
Calcium: 7.7 mg/dL — ABNORMAL LOW (ref 8.9–10.3)
Chloride: 101 mmol/L (ref 98–111)
Creatinine, Ser: 4.49 mg/dL — ABNORMAL HIGH (ref 0.61–1.24)
GFR calc Af Amer: 17 mL/min — ABNORMAL LOW (ref >60)
GFR calc non Af Amer: 14 mL/min — ABNORMAL LOW (ref >60)
Glucose, Bld: 275 mg/dL — ABNORMAL HIGH (ref 70–99)
Phosphorus: 4 mg/dL (ref 2.5–4.6)
Potassium: 3.4 mmol/L — ABNORMAL LOW (ref 3.5–5.1)
Sodium: 136 mmol/L (ref 135–145)

## 2019-03-15 LAB — GLUCOSE, CAPILLARY
Glucose-Capillary: 260 mg/dL — ABNORMAL HIGH (ref 70–99)
Glucose-Capillary: 150 mg/dL — ABNORMAL HIGH (ref 70–99)
Glucose-Capillary: 214 mg/dL — ABNORMAL HIGH (ref 70–99)
Glucose-Capillary: 199 mg/dL — ABNORMAL HIGH (ref 70–99)

## 2019-03-15 MED ORDER — LIDOCAINE HCL 1 % IJ SOLN
INTRAMUSCULAR | Status: AC
  Filled 2019-03-15: qty 20

## 2019-03-15 MED ORDER — LIDOCAINE HCL 1 % IJ SOLN
INTRAMUSCULAR | Status: AC | PRN
  Administered 2019-03-15: 5 mL

## 2019-03-15 MED ORDER — HEPARIN SODIUM (PORCINE) 1000 UNIT/ML DIALYSIS
3000.0000 [IU] | INTRAMUSCULAR | Status: DC | PRN
  Filled 2019-03-15: qty 3

## 2019-03-15 MED ORDER — CHLORHEXIDINE GLUCONATE 4 % EX LIQD
CUTANEOUS | Status: AC
  Filled 2019-03-15: qty 15

## 2019-03-15 MED ORDER — NEPRO/CARBSTEADY PO LIQD
237.0000 mL | Freq: Three times a day (TID) | ORAL | Status: DC
  Administered 2019-03-15 – 2019-03-23 (×16): 237 mL via ORAL

## 2019-03-15 NOTE — Procedures (Signed)
PROCEDURE SUMMARY:  Successful removal of tunneled hemodialysis catheter.  Patient tolerated well.  EBL < 5 mL  See full dictation in Imaging for details.  Sidney Kann S Terrea Bruster [name] 03/15/2019 2:22 PM

## 2019-03-15 NOTE — Plan of Care (Signed)
  Problem: Coping: Goal: Level of anxiety will decrease Outcome: Progressing   Problem: Pain Managment: Goal: General experience of comfort will improve Outcome: Progressing   Problem: Safety: Goal: Ability to remain free from injury will improve Outcome: Progressing   Problem: Skin Integrity: Goal: Risk for impaired skin integrity will decrease Outcome: Progressing

## 2019-03-15 NOTE — Progress Notes (Signed)
PROGRESS NOTE    Eddie Davies    Code Status: Full Code  GEZ:[id number] DOB: 31-May-1970 DOA: 03/12/2019 LOS: 2 days  PCP: [facility] CC:  Chief Complaint  Patient presents with  . Emesis       Hospital Summary  Eddie Davies is a 49 y.o. male with a history of ESRD on HD, ESBL who was admitted early this morning by Dr. Alcario Drought for retroperitoneal abscess from SNF.  He was recently admitted for left-sided pyelonephritis and sepsis status post left-sided radical nephrectomy and right double-J stent placement at OSH who presented with N/V/fever.  Has since been evaluated by nephrology and patient underwent HD today.   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  2/18: Status post CT-guided drain placement x2.  ID consulted. 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 Left retroperitoneal abscess with history of ESBL Klebsiella bacteremia  status post CT-guided drain placement x2 via IR, POD 1 1. Currently on meropenem 2. ID on board 3. Cultures no growth to date from abscess 4. HD catheter removed today, per ID: Will need new one placed and will need a separate line for IV Carbapenem if cultures are positive for ESBL Klebsiella 5. Repeat CT scan when drain output<10 to 15 mL/day 2. 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 ESRD on HD M, W, F  1. had second inpatient HD treatment today  2. HD catheter removed as above 3. Continue following with nephro 4. Seizure disorder continue Tegretol and phenobarbital 5. Diabetes -continue Levemir 8 units daily, continue sliding scale 6. Metabolic bone disease 1. On PhosLo 7. Anemia 1. Was given ESA as outpatient 8. Hypertension continue current regimen and volume removal per nephro 9. Chronic nonobstructing right urolithiasis -stable, urology on board 10. Hypokalemia -repleted by nephro 11. Hyperlipidemia -on statin   DVT prophylaxis: SCDs Family Communication: No family at bedside Disposition Plan:   Patient came from:   SNF                                                                                          Anticipated d/c place: TBD  Barriers to d/c: Medical stability, consultants data  Pressure injury documentation   Pressure  Injury 04/21/18 Stage II -  Partial thickness loss of dermis presenting as a shallow open ulcer with a red, pink wound bed without slough. redness, possible previous injury (Active)  04/21/18 2200  Location: Coccyx  Location Orientation: Medial  Staging: Stage II -  Partial thickness loss of dermis presenting as a shallow open ulcer with a red, pink wound bed without slough.  Wound Description (Comments): redness, possible previous injury  Present on Admission: Yes     Consultants  ID urology Nephrology IR  Procedures  2/17 HD, on MWF schedule 2/18 CT-guided drain placement x2 2/19 HD catheter removal  Antibiotics   Anti-infectives (From  admission, onward)   Start     Dose/Rate Route Frequency Ordered Stop   03/13/19 2200  meropenem (MERREM) 500 mg in sodium chloride 0.9 % 100 mL IVPB     500 mg 200 mL/hr over 30 Minutes Intravenous Every 24 hours 03/12/19 2210     03/12/19 2215  piperacillin-tazobactam (ZOSYN) IVPB 3.375 g  Status:  Discontinued     3.375 g 12.5 mL/hr over 240 Minutes Intravenous  Once 03/12/19 2205 03/12/19 2208   03/12/19 2215  meropenem (MERREM) 1 g in sodium chloride 0.9 % 100 mL IVPB     1 g 200 mL/hr over 30 Minutes Intravenous  Once 03/12/19 2210 03/13/19 0223        Subjective   Patient seen at bedside after HD treatment and IR procedure this morning.  Tolerated procedure well.  Complains of some discomfort where the drains are but otherwise no complaints.  Objective   Vitals:   03/15/19 1245 03/15/19 1300 03/15/19 1330 03/15/19 1456  BP: 105/63 105/61 (!) 103/59 119/68  Pulse: 87 93 88 96  Resp: 20 19 18 18   Temp:   (!) 97.4 F (36.3 C) 97.6 F (36.4 C)  TempSrc:   Axillary Oral  SpO2:   100% 100%  Weight:   88.2 kg   Height:        Intake/Output Summary (Last 24 hours) at 03/15/2019 1824 Last data filed at 03/15/2019 1540 Gross per 24 hour  Intake 360 ml  Output 1691 ml  Net -1331 ml   Filed Weights   03/13/19 1640 03/15/19 0935 03/15/19 1330  Weight: 91.7 kg 89.7 kg 88.2 kg    Examination:  Physical Exam Vitals and nursing note reviewed.  Constitutional:      Appearance: Normal appearance.  HENT:     Head: Normocephalic and atraumatic.  Eyes:     Conjunctiva/sclera: Conjunctivae normal.  Cardiovascular:     Rate and Rhythm: Normal rate and regular rhythm.  Pulmonary:     Effort: Pulmonary effort is normal.     Breath sounds: Normal breath sounds.  Abdominal:     General: Abdomen is flat.     Palpations: Abdomen is soft.  Musculoskeletal:        General: No swelling or tenderness.     Comments: Drains with bloody/serosanguineous drainage, no purulence    Skin:    Comments: Diffusely dry skin  Neurological:     Mental Status: He is alert. Mental status is at baseline.  Psychiatric:        Mood and Affect: Mood normal.        Behavior: Behavior normal.     Data Reviewed: I have personally reviewed following labs and imaging studies  CBC: Recent Labs  Lab 03/12/19 2331 03/13/19 0423 03/14/19 0154 03/15/19 0241  WBC 8.5 8.5 9.1 6.0  NEUTROABS 7.0  --   --   --  HGB 8.3* 8.2* 8.4* 8.0*  HCT 25.9* 25.3* 25.6* 25.1*  MCV 94.2 93.0 92.4 95.4  PLT 228 216 200 956   Basic Metabolic Panel: Recent Labs  Lab 03/12/19 2051 03/12/19 2331 03/13/19 0423 03/13/19 1230 03/14/19 0154 03/15/19 0241  NA 140  --  138 136 134* 136  K 2.6*  --  2.7* 3.2* 2.9* 3.4*  CL 104  --  101 103 98 101  CO2 23  --  21* 21* 24 23  GLUCOSE 59*  --  81 102* 278* 275*  BUN 40*  --  41* 43* 16 27*  CREATININE 5.72*  --  5.85* 5.91* 3.21* 4.49*  CALCIUM 7.7*  --  7.5* 7.8* 7.5* 7.7*  MG  --  1.8  --   --   --   --   PHOS  --   --   --  5.6*  --  4.0   GFR: Estimated Creatinine Clearance: 23.4 mL/min (A) (by C-G formula based on SCr of 4.49 mg/dL (H)). Liver Function Tests: Recent Labs  Lab 03/12/19 2051 03/13/19 0423 03/13/19 1230 03/15/19 0241  AST 10* 10*  --   --   ALT 8 7  --   --   ALKPHOS 124 108  --   --   BILITOT 0.8 0.4  --   --   PROT 6.6 5.6*  --   --   ALBUMIN 1.8* 1.5* 1.5* 1.5*   Recent Labs  Lab 03/12/19 2051  LIPASE 13   Recent Labs  Lab 03/12/19 2051  AMMONIA 61*   Coagulation Profile: Recent Labs  Lab 03/12/19 2331  INR 1.7*   Cardiac Enzymes: No results for input(s): CKTOTAL, CKMB, CKMBINDEX, TROPONINI in the last 168 hours. BNP (last 3 results) No results for input(s): PROBNP in the last 8760 hours. HbA1C: Recent Labs    03/12/19 2331  HGBA1C 6.5*   CBG: Recent Labs  Lab 03/14/19 2003 03/14/19 2357 03/15/19 0435 03/15/19 0809 03/15/19 1547  GLUCAP 181* 248* 260* 150* 214*   Lipid  Profile: No results for input(s): CHOL, HDL, LDLCALC, TRIG, CHOLHDL, LDLDIRECT in the last 72 hours. Thyroid Function Tests: No results for input(s): TSH, T4TOTAL, FREET4, T3FREE, THYROIDAB in the last 72 hours. Anemia Panel: Recent Labs    03/14/19 1444  FERRITIN 949*  TIBC NOT CALCULATED  IRON 19*   Sepsis Labs: Recent Labs  Lab 03/12/19 2051 03/13/19 1139  LATICACIDVEN 0.8 0.8    Recent Results (from the past 240 hour(s))  Blood Culture (routine x 2)     Status: None (Preliminary result)   Collection Time: 03/12/19  9:21 PM   Specimen: BLOOD  Result Value Ref Range Status   Specimen Description BLOOD RIGHT ANTECUBITAL  Final   Special Requests   Final    BOTTLES DRAWN AEROBIC AND ANAEROBIC Blood Culture adequate volume   Culture   Final    NO GROWTH 3 DAYS Performed at [facility], [place] [address], [place], [place] [address]    Report Status PENDING  Incomplete  Blood Culture (routine x 2)     Status: None (Preliminary result)   Collection Time: 03/12/19  9:21 PM   Specimen: BLOOD RIGHT HAND  Result Value Ref Range Status   Specimen Description BLOOD RIGHT HAND  Final   Special Requests   Final    BOTTLES DRAWN AEROBIC AND ANAEROBIC Blood Culture adequate volume   Culture   Final    NO GROWTH 3 DAYS  Performed at [facility], [place] [address], [place], [place] [address]    Report Status PENDING  Incomplete  Urine culture     Status: Abnormal   Collection Time: 03/13/19  7:24 AM   Specimen: In/Out Cath Urine  Result Value Ref Range Status   Specimen Description IN/OUT CATH URINE  Final   Special Requests   Final    NONE Performed at [facility], [place] [address], [place], [place] [address]    Culture MULTIPLE SPECIES PRESENT, SUGGEST RECOLLECTION (A)  Final   Report Status 03/14/2019 FINAL  Final  Aerobic/Anaerobic Culture (surgical/deep wound)     Status: None (Preliminary result)   Collection Time: 03/14/19 11:01 AM   Specimen: Abscess   Result Value Ref Range Status   Specimen Description ABSCESS  Final   Special Requests NONE  Final   Gram Stain   Final    ABUNDANT WBC PRESENT, PREDOMINANTLY PMN NO ORGANISMS SEEN    Culture   Final    MODERATE GRAM NEGATIVE RODS CULTURE REINCUBATED FOR BETTER GROWTH Performed at [facility], [place] [address] [place], [place] [address]    Report Status PENDING  Incomplete         Radiology Studies: IR Removal Tun Cv Cath W/O FL  Result Date: 03/15/2019 INDICATION: End-stage renal disease on hemodialysis. Retroperitoneal abscesses status post drain placement. Bacteremia. Request for removal of tunneled hemodialysis catheter. EXAM: REMOVAL OF TUNNELED HEMODIALYSIS CATHETER MEDICATIONS: 1% lidocaine 2 mL COMPLICATIONS: None immediate. PROCEDURE: Informed written consent was obtained from the patient following an explanation of the procedure, risks, benefits and alternatives to treatment. A time out was performed prior to the initiation of the procedure. Maximal barrier sterile technique was utilized including caps, mask, sterile gowns, sterile gloves, large sterile drape, hand hygiene, and ChloraPrep. 1% lidocaine with epinephrine was injected under sterile conditions along the subcutaneous tunnel. Utilizing a combination of blunt dissection and gentle traction, the catheter was removed intact. Hemostasis was obtained with manual compression. A dressing was placed. The patient tolerated the procedure well without immediate post procedural complication. IMPRESSION: Successful removal of tunneled dialysis catheter. Read by: Gareth Eagle, [name] Electronically Signed   By: Jerilynn Mages.  Shick M.D.   On: 03/15/2019 14:21   CT IMAGE GUIDED FLUID DRAIN BY CATHETER  Result Date: 03/14/2019 INDICATION: 49 year old with history of nephrectomy and recurrent left retroperitoneal abscess. EXAM: CT-GUIDED PLACEMENT OF LEFT RETROPERITONEAL ABSCESS DRAIN X2 MEDICATIONS: The patient is currently admitted to the  hospital and receiving intravenous antibiotics. ANESTHESIA/SEDATION: Fentanyl 50 mcg IV; Versed 1.0 mg IV Moderate Sedation Time:  45 minutes The patient was continuously monitored during the procedure by the interventional radiology nurse under my direct supervision. COMPLICATIONS: None immediate. PROCEDURE: Informed written consent was obtained from the patient after a thorough discussion of the procedural risks, benefits and alternatives. All questions were addressed. Maximal Sterile Barrier Technique was utilized including caps, mask, sterile gowns, sterile gloves, sterile drape, hand hygiene and skin antiseptic. A timeout was performed prior to the initiation of the procedure. Patient was placed prone. CT images through the abdomen were obtained. Large air-fluid collection in the left retroperitoneum. The left flank was prepped with chlorhexidine and sterile field was created. Skin and soft tissues were anesthetized with 1% lidocaine. Using CT guidance, 18 gauge trocar needle was directed into the large collection. Yellow purulent fluid was aspirated. Superstiff Amplatz wire was placed through the needle and the tract was dilated to accommodate a 14 [place]  biliary type drain. Large amount of yellow purulent fluid was removed. Follow up CT images were obtained. CT images demonstrated residual air-fluid collection posterior to the spleen and upper portion of the collection. Skin was anesthetized with 1% lidocaine and a new incision was made. New 18 gauge trocar needle was directed into the upper portion of the residual collection with CT guidance. Large amount of air was aspirated. Superstiff Amplatz wire was advanced into the collection and the tract was dilated to accommodate a 12 [place] multipurpose drain. Large amount of gas and bloody purulent fluid was aspirated. Follow up CT images were obtained. Both catheters sutured to skin and attached to a suction bulb. StatLock was placed. FINDINGS: Large complex  air-fluid collection in the left retroperitoneum with some extension into the left lateral subcutaneous tissues. A 14 French biliary type drain was placed in the midportion of the collection. 430 mL of yellow purulent fluid was removed from this drain. Follow up CT images demonstrated residual air-fluid collection along the superior aspect of the collection posterior to the spleen. Second drain was placed in the superior aspect of the collection and 100 mL purulent fluid and a large amount of gas was removed. Final CT images demonstrate marked decompression of the abscess cavities. A small amount of residual gas and fluid. Again noted is high-density material in the right renal collecting system compatible with nephrolithiasis. There is a focal stone at the right UPJ that measures roughly 1.3 cm. IMPRESSION: CT-guided placement of two percutaneous drains within the large left retroperitoneal abscess. Total of 530 mL of purulent fluid was removed from the abscess. Fluid was sent for culture. Patient will need follow-up CT images when the drainage decreases to look for any residual collections. Electronically Signed   By: Markus Daft M.D.   On: 03/14/2019 14:10   CT IMAGE GUIDED DRAINAGE BY PERCUTANEOUS CATHETER  Result Date: 03/14/2019 INDICATION: 49 year old with history of nephrectomy and recurrent left retroperitoneal abscess. EXAM: CT-GUIDED PLACEMENT OF LEFT RETROPERITONEAL ABSCESS DRAIN X2 MEDICATIONS: The patient is currently admitted to the hospital and receiving intravenous antibiotics. ANESTHESIA/SEDATION: Fentanyl 50 mcg IV; Versed 1.0 mg IV Moderate Sedation Time:  45 minutes The patient was continuously monitored during the procedure by the interventional radiology nurse under my direct supervision. COMPLICATIONS: None immediate. PROCEDURE: Informed written consent was obtained from the patient after a thorough discussion of the procedural risks, benefits and alternatives. All questions were  addressed. Maximal Sterile Barrier Technique was utilized including caps, mask, sterile gowns, sterile gloves, sterile drape, hand hygiene and skin antiseptic. A timeout was performed prior to the initiation of the procedure. Patient was placed prone. CT images through the abdomen were obtained. Large air-fluid collection in the left retroperitoneum. The left flank was prepped with chlorhexidine and sterile field was created. Skin and soft tissues were anesthetized with 1% lidocaine. Using CT guidance, 18 gauge trocar needle was directed into the large collection. Yellow purulent fluid was aspirated. Superstiff Amplatz wire was placed through the needle and the tract was dilated to accommodate a 14 [place] biliary type drain. Large amount of yellow purulent fluid was removed. Follow up CT images were obtained. CT images demonstrated residual air-fluid collection posterior to the spleen and upper portion of the collection. Skin was anesthetized with 1% lidocaine and a new incision was made. New 18 gauge trocar needle was directed into the upper portion of the residual collection with CT guidance. Large amount of air was aspirated. Superstiff Amplatz wire was advanced into the  collection and the tract was dilated to accommodate a 12 [place] multipurpose drain. Large amount of gas and bloody purulent fluid was aspirated. Follow up CT images were obtained. Both catheters sutured to skin and attached to a suction bulb. StatLock was placed. FINDINGS: Large complex air-fluid collection in the left retroperitoneum with some extension into the left lateral subcutaneous tissues. A 14 French biliary type drain was placed in the midportion of the collection. 430 mL of yellow purulent fluid was removed from this drain. Follow up CT images demonstrated residual air-fluid collection along the superior aspect of the collection posterior to the spleen. Second drain was placed in the superior aspect of the collection and 100 mL  purulent fluid and a large amount of gas was removed. Final CT images demonstrate marked decompression of the abscess cavities. A small amount of residual gas and fluid. Again noted is high-density material in the right renal collecting system compatible with nephrolithiasis. There is a focal stone at the right UPJ that measures roughly 1.3 cm. IMPRESSION: CT-guided placement of two percutaneous drains within the large left retroperitoneal abscess. Total of 530 mL of purulent fluid was removed from the abscess. Fluid was sent for culture. Patient will need follow-up CT images when the drainage decreases to look for any residual collections. Electronically Signed   By: Markus Daft M.D.   On: 03/14/2019 14:10        Scheduled Meds: . allopurinol  100 mg Oral Daily  . calcium acetate  2,001 mg Oral TID WC  . carbamazepine  900 mg Oral BID  . Chlorhexidine Gluconate Cloth  6 each Topical Q0600  . feeding supplement (NEPRO CARB STEADY)  237 mL Oral TID BM  . ferrous sulfate  325 mg Oral Q breakfast  . FLUoxetine  20 mg Oral Daily  . insulin aspart  0-9 Units Subcutaneous Q4H  . insulin detemir  8 Units Subcutaneous Daily  . lactulose  20 g Oral BID  . lidocaine      . multivitamin with minerals  1 tablet Oral QHS  . pantoprazole  40 mg Oral Daily  . PHENobarbital  259.2 mg Oral QHS  . simvastatin  20 mg Oral QHS  . sodium chloride flush  5 mL Intracatheter Q8H  . tamsulosin  0.4 mg Oral QHS   Continuous Infusions: . meropenem (MERREM) IV 500 mg (03/14/19 2123)     Time spent: 24 minutes with over 50% of the time coordinating the patient's care    Harold Hedge, DO [facility] Pager [phone]  Call night coverage person covering after 7pm

## 2019-03-15 NOTE — Progress Notes (Signed)
Referring Physician(s): Dr. Cassandria Santee  Supervising Physician: Daryll Brod  Patient Status:  Eddie Davies - In-pt  Chief Complaint:  Retroperitoneal fluid collection S/P drains x 2 by Dr.Henn 03/14/19  Brief History:  Eddie Davies is a 49 y.o. male with history of ESRD on HD who was admitted for left sided pyelonephritis, sepsis, s/p left sided radical nephrectomy and right double J stent placement at OSH.  He presented to [facility] ED with nausea, vomiting fever and was found to have a left retroperitoneal  fluid collection.   He underwent drain placement by Dr. Anselm Pancoast yesterday.  Culture is pending.  Subjective:  No complaints. Down for removal of HD catheter.  Allergies: Fish oil  Medications: Prior to Admission medications   Medication Sig Start Date End Date Taking? Authorizing Provider  acetaminophen (TYLENOL) 325 MG tablet Take 650 mg by mouth every 8 (eight) hours as needed for fever (pain).   Yes [provider]  allopurinol (ZYLOPRIM) 100 MG tablet Take 100 mg by mouth daily.  08/01/18  Yes [provider]  calcium acetate (PHOSLO) 667 MG capsule Take 2,001 mg by mouth 3 (three) times daily with meals.    Yes [provider]  carbamazepine (CARBATROL) 100 MG 12 hr capsule Take 100 mg by mouth 2 (two) times daily. 02/23/19  Yes [provider]  carbamazepine (TEGRETOL XR) 400 MG 12 hr tablet Take 800 mg by mouth 2 (two) times daily.   Yes [provider]  cloNIDine (CATAPRES) 0.1 MG tablet Take 0.1 mg by mouth 2 (two) times daily as needed (SBP ABOVE 170).  07/25/18  Yes [provider]  DULoxetine (CYMBALTA) 20 MG capsule Take 20 mg by mouth daily.   Yes [provider]  ferrous sulfate 325 (65 FE) MG tablet Take 325 mg by mouth daily with breakfast.   Yes [provider]  guaiFENesin (MUCINEX) 600 MG 12 hr tablet Take 600 mg by mouth every 12 (twelve) hours as needed for cough (congestion).    Yes [provider]  insulin aspart (NOVOLOG) 100 UNIT/ML injection Inject 0-15 Units into the skin 4 (four) times daily -  with meals and at bedtime. 70-120=0 units, 121-150=2 units, 151-200=3 units, 201-250=5 units, 251-300=8 units, 301-350=11 units, 351-400=15 units, >400 call MD   Yes [provider]  Insulin Glargine (BASAGLAR KWIKPEN) 100 UNIT/ML SOPN Inject 0.28 mLs (28 Units total) into the skin at bedtime. Patient taking differently: Inject 20 Units into the skin at bedtime.  12/05/18 03/13/19 Yes [place] Eric J, DO  Ipratropium-Albuterol (COMBIVENT) 20-100 MCG/ACT AERS respimat Inhale 1 puff into the lungs every 6 (six) hours as needed for wheezing.   Yes [provider]  lactulose (CHRONULAC) 10 GM/15ML solution Take 15 mLs (10 g total) by mouth 2 (two) times daily. Patient taking differently: Take 30 g by mouth 2 (two) times daily.  05/31/18  Yes [name], [name] K, MD  lidocaine-prilocaine (EMLA) cream Apply 1 application topically every Monday, Wednesday, and Friday.   Yes [provider]  Multiple Vitamin (MULTIVITAMIN WITH MINERALS) TABS tablet Take 1 tablet by mouth at bedtime.   Yes [provider]  omeprazole (PRILOSEC) 20 MG capsule Take 20 mg by mouth daily.  07/04/18  Yes [provider]  ondansetron (ZOFRAN) 4 MG tablet Take 4 mg by mouth every 8 (eight) hours as needed for nausea or vomiting.  08/03/18  Yes [provider]  PHENobarbital (LUMINAL) 64.8 MG tablet Take 4 tablets (259.2  mg total) by mouth at bedtime. 12/05/18 03/13/19 Yes [place] Eric J, DO  PROTEIN PO Take 30 mLs by mouth in the morning and at bedtime.   Yes [provider]  simvastatin (ZOCOR) 20 MG tablet Take 20 mg by mouth at bedtime.    Yes [provider]  sodium bicarbonate 650 MG tablet Take 650 mg by mouth 3 (three) times daily.   Yes [provider]  tamsulosin (FLOMAX) 0.4 MG CAPS capsule Take 0.4 mg by mouth at bedtime.    Yes [provider]  Darbepoetin Alfa (ARANESP) 200 MCG/0.4ML SOSY injection Inject 0.4 mLs (200 mcg total) into the vein every Thursday with hemodialysis. 06/28/18   Amin, Jeanella Flattery, MD  HYDROcodone-acetaminophen (NORCO) 5-325 MG tablet Take 1 tablet by mouth every 6 (six) hours as needed for moderate pain. 12/05/18   [place] Donnamarie Poag, DO     Vital Signs: BP (!) 103/59   Pulse 88   Temp (!) 97.4 F (36.3 C) (Axillary)   Resp 18   Ht 6' 2"  (1.88 m)   Wt 88.2 kg   SpO2 100%   BMI 24.97 kg/m   Physical Exam Constitutional:      Appearance: Normal appearance.  HENT:     Head: Normocephalic and atraumatic.  Cardiovascular:     Rate and Rhythm: Normal rate.  Pulmonary:     Effort: Pulmonary effort is normal. No respiratory distress.  Abdominal:     Palpations: Abdomen is soft.     Tenderness: There is no abdominal tenderness.  Musculoskeletal:       Arms:     Comments: Right UE arteriovenous fistula. Feels mature and has palpable thrill.  Skin:    General: Skin is warm and dry.  Neurological:     General: No focal deficit present.     Mental Status: He is alert and oriented to person, place, and time.  Psychiatric:        Mood and Affect: Mood normal.        Behavior: Behavior normal.        Thought Content: Thought content normal.        Judgment: Judgment normal.   Retroperitoneal drains x 2 #1 ~ 105 mL output, thin red drainage #2 ~ 35 mL output, thin red drainage in bulb Flushed easily after removing clot from the tubing.  Imaging: CT ABDOMEN PELVIS W CONTRAST  Result Date: 03/12/2019 CLINICAL DATA:  Pyelonephritis, sepsis, renal stent, left nephrectomy EXAM: CT ABDOMEN AND PELVIS WITH CONTRAST TECHNIQUE: Multidetector CT imaging of the abdomen and pelvis was performed using the standard protocol following bolus administration of intravenous contrast. CONTRAST:  136m OMNIPAQUE IOHEXOL 300 MG/ML  SOLN COMPARISON:  02/04/2019 FINDINGS: Lower chest: Small  bilateral pleural effusions. Coronary artery calcifications and/or stents. Hepatobiliary: No focal liver abnormality is seen. Status post cholecystectomy. No biliary dilatation. Pancreas: Atrophic pancreas with parenchymal calcifications, in keeping with chronic stigmata of pancreatitis. No pancreatic ductal dilatation or surrounding inflammatory changes. Spleen: Splenomegaly. Adrenals/Urinary Tract: Adrenal glands are unremarkable. Redemonstrated postoperative findings of left nephrectomy. There has been interval enlargement of a left retroperitoneal air and fluid collection involving the left psoas, now measuring at least 17.4 x 5.9 cm (series 3, image 53). A previously seen right-sided double-J ureteral stent is been removed. A 1.3 cm calculus remains in the right renal pelvis with additional small right-sided calyceal calculi. Bladder is unremarkable. Stomach/Bowel: Stomach is within normal limits. No evidence of bowel wall thickening, distention, or inflammatory changes.  Large stool balls in the distal sigmoid and rectum. Vascular/Lymphatic: No significant vascular findings are present. Multiple prominent retroperitoneal lymph nodes similar to prior examination. Reproductive: No mass or other significant abnormality. Other: Anasarca. No abdominopelvic ascites. Musculoskeletal: No acute or significant osseous findings. Intramedullary nail fixation of the left femoral neck. IMPRESSION: 1. Status post left nephrectomy. Interval enlargement of left retroperitoneal air and fluid collection involving the left psoas, now measuring at least 17.4 x 5.9 cm and consistent with worsened abscess. 2. Interval removal of right-sided double-J ureteral stent. A 1.3 cm calculus remains in the right renal pelvis with additional small right-sided calyceal calculi. No hydronephrosis. 3. Large stool balls in the distal sigmoid and rectum. 4. Small bilateral pleural effusions. 5. Anasarca. 6. Splenomegaly. 7. Calcific stigmata of  chronic pancreatitis. Electronically Signed   By: Eddie Candle M.D.   On: 03/12/2019 23:19   DG Chest Port 1 View  Result Date: 03/12/2019 CLINICAL DATA:  49 year old male with chest pain shortness of breath fever nausea vomiting. EXAM: PORTABLE CHEST 1 VIEW COMPARISON:  Portable chest 12/25/2018 and earlier. FINDINGS: Portable AP semi upright view at 2234 hours. Lower lung volumes. Mediastinal contours remain within normal limits. Stable dual lumen right chest dialysis type catheter. Visualized tracheal air column is within normal limits. Crowding of lung markings. No pneumothorax, pleural effusion or consolidation. Increased pulmonary vascular congestion but no overt edema. No acute osseous abnormality identified. IMPRESSION: Lower lung volumes and increased pulmonary vascular congestion since December. No other acute cardiopulmonary abnormality. Electronically Signed   By: Genevie Ann M.D.   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 EXAM: CT-GUIDED PLACEMENT OF LEFT RETROPERITONEAL ABSCESS DRAIN X2 MEDICATIONS: The patient is currently admitted to the hospital and receiving intravenous antibiotics. ANESTHESIA/SEDATION: Fentanyl 50 mcg IV; Versed 1.0 mg IV Moderate Sedation Time:  45 minutes The patient was continuously monitored during the procedure by the interventional radiology nurse under my direct supervision. COMPLICATIONS: None immediate. PROCEDURE: Informed written consent was obtained from the patient after a thorough discussion of the procedural risks, benefits and alternatives. All questions were addressed. Maximal Sterile Barrier Technique was utilized including caps, mask, sterile gowns, sterile gloves, sterile drape, hand hygiene and skin antiseptic. A timeout was performed prior to the initiation of the procedure. Patient was placed prone. CT images  through the abdomen were obtained. Large air-fluid collection in the left retroperitoneum. The left flank was prepped with chlorhexidine and sterile field was created. Skin and soft tissues were anesthetized with 1% lidocaine. Using CT guidance, 18 gauge trocar needle was directed into the large collection. Yellow purulent fluid was aspirated. Superstiff Amplatz wire was placed through the needle and the tract was dilated to accommodate a 14 [place] biliary type drain. Large amount of yellow purulent fluid was removed. Follow up CT images were obtained. CT images demonstrated residual air-fluid collection posterior to the spleen and upper portion of the collection. Skin was anesthetized with 1% lidocaine and a new incision was made. New 18 gauge trocar needle was directed into the upper portion of the residual collection with CT guidance. Large amount of air was aspirated. Superstiff Amplatz wire was advanced into the collection and the tract was dilated to accommodate a 12 [place] multipurpose drain. Large amount of gas and bloody purulent fluid was aspirated. Follow up CT images were obtained. Both catheters sutured to skin and attached to a  suction bulb. StatLock was placed. FINDINGS: Large complex air-fluid collection in the left retroperitoneum with some extension into the left lateral subcutaneous tissues. A 14 French biliary type drain was placed in the midportion of the collection. 430 mL of yellow purulent fluid was removed from this drain. Follow up CT images demonstrated residual air-fluid collection along the superior aspect of the collection posterior to the spleen. Second drain was placed in the superior aspect of the collection and 100 mL purulent fluid and a large amount of gas was removed. Final CT images demonstrate marked decompression of the abscess cavities. A small amount of residual gas and fluid. Again noted is high-density material in the right renal collecting system compatible with  nephrolithiasis. There is a focal stone at the right UPJ that measures roughly 1.3 cm. IMPRESSION: CT-guided placement of two percutaneous drains within the large left retroperitoneal abscess. Total of 530 mL of purulent fluid was removed from the abscess. Fluid was sent for culture. Patient will need follow-up CT images when the drainage decreases to look for any residual collections. Electronically Signed   By: Markus Daft M.D.   On: 03/14/2019 14:10   CT IMAGE GUIDED DRAINAGE BY PERCUTANEOUS CATHETER  Result Date: 03/14/2019 INDICATION: 49 year old with history of nephrectomy and recurrent left retroperitoneal abscess. EXAM: CT-GUIDED PLACEMENT OF LEFT RETROPERITONEAL ABSCESS DRAIN X2 MEDICATIONS: The patient is currently admitted to the hospital and receiving intravenous antibiotics. ANESTHESIA/SEDATION: Fentanyl 50 mcg IV; Versed 1.0 mg IV Moderate Sedation Time:  45 minutes The patient was continuously monitored during the procedure by the interventional radiology nurse under my direct supervision. COMPLICATIONS: None immediate. PROCEDURE: Informed written consent was obtained from the patient after a thorough discussion of the procedural risks, benefits and alternatives. All questions were addressed. Maximal Sterile Barrier Technique was utilized including caps, mask, sterile gowns, sterile gloves, sterile drape, hand hygiene and skin antiseptic. A timeout was performed prior to the initiation of the procedure. Patient was placed prone. CT images through the abdomen were obtained. Large air-fluid collection in the left retroperitoneum. The left flank was prepped with chlorhexidine and sterile field was created. Skin and soft tissues were anesthetized with 1% lidocaine. Using CT guidance, 18 gauge trocar needle was directed into the large collection. Yellow purulent fluid was aspirated. Superstiff Amplatz wire was placed through the needle and the tract was dilated to accommodate a 14 [place] biliary type  drain. Large amount of yellow purulent fluid was removed. Follow up CT images were obtained. CT images demonstrated residual air-fluid collection posterior to the spleen and upper portion of the collection. Skin was anesthetized with 1% lidocaine and a new incision was made. New 18 gauge trocar needle was directed into the upper portion of the residual collection with CT guidance. Large amount of air was aspirated. Superstiff Amplatz wire was advanced into the collection and the tract was dilated to accommodate a 12 [place] multipurpose drain. Large amount of gas and bloody purulent fluid was aspirated. Follow up CT images were obtained. Both catheters sutured to skin and attached to a suction bulb. StatLock was placed. FINDINGS: Large complex air-fluid collection in the left retroperitoneum with some extension into the left lateral subcutaneous tissues. A 14 French biliary type drain was placed in the midportion of the collection. 430 mL of yellow purulent fluid was removed from this drain. Follow up CT images demonstrated residual air-fluid collection along the superior aspect of the collection posterior to the spleen. Second drain was placed in the superior aspect of  the collection and 100 mL purulent fluid and a large amount of gas was removed. Final CT images demonstrate marked decompression of the abscess cavities. A small amount of residual gas and fluid. Again noted is high-density material in the right renal collecting system compatible with nephrolithiasis. There is a focal stone at the right UPJ that measures roughly 1.3 cm. IMPRESSION: CT-guided placement of two percutaneous drains within the large left retroperitoneal abscess. Total of 530 mL of purulent fluid was removed from the abscess. Fluid was sent for culture. Patient will need follow-up CT images when the drainage decreases to look for any residual collections. Electronically Signed   By: Markus Daft M.D.   On: 03/14/2019 14:10     Labs:  CBC: Recent Labs    03/12/19 2331 03/13/19 0423 03/14/19 0154 03/15/19 0241  WBC 8.5 8.5 9.1 6.0  HGB 8.3* 8.2* 8.4* 8.0*  HCT 25.9* 25.3* 25.6* 25.1*  PLT 228 216 200 198    COAGS: Recent Labs    05/19/18 1339 05/19/18 1339 05/21/18 0419 05/29/18 0500 06/18/18 1319 03/12/19 2331  INR 1.6*   < > 1.7* 1.3* 1.9* 1.7*  APTT 47*  --   --   --   --  39*   < > = values in this interval not displayed.    BMP: Recent Labs    03/13/19 0423 03/13/19 1230 03/14/19 0154 03/15/19 0241  NA 138 136 134* 136  K 2.7* 3.2* 2.9* 3.4*  CL 101 103 98 101  CO2 21* 21* 24 23  GLUCOSE 81 102* 278* 275*  BUN 41* 43* 16 27*  CALCIUM 7.5* 7.8* 7.5* 7.7*  CREATININE 5.85* 5.91* 3.21* 4.49*  GFRNONAA 10* 10* 22* 14*  GFRAA 12* 12* 25* 17*    LIVER FUNCTION TESTS: Recent Labs    10/23/18 1043 10/23/18 1043 11/27/18 2019 12/03/18 0947 03/12/19 2051 03/13/19 0423 03/13/19 1230 03/15/19 0241  BILITOT 0.4  --  0.7  --  0.8 0.4  --   --   AST 22  --  17  --  10* 10*  --   --   ALT 20  --  14  --  8 7  --   --   ALKPHOS 145*  --  125  --  124 108  --   --   PROT 7.3  --  6.5  --  6.6 5.6*  --   --   ALBUMIN 3.1*   < > 2.7*   < > 1.8* 1.5* 1.5* 1.5*   < > = values in this interval not displayed.    Assessment and Plan:  Left retroperitoneal fluid collection, s/p drains x 2 by Dr. Anselm Pancoast.  Clot removed from drain #2, should drain better now.  Both drains flushed easily.  Continue routine drain care with flushes.  Recommend CT scan when output < 10-15 mL per day.  Patient tolerated HD catheter removal well. Sterile dressing placed.  Hopefully can now use right arm AV fistula for HD.  Electronically Signed: Murrell Redden, [name] 03/15/2019, 2:22 PM    I spent a total of 15 Minutes at the the patient's bedside AND on the patient's hospital floor or unit, greater than 50% of which was counseling/coordinating care for f/u drains.

## 2019-03-15 NOTE — Progress Notes (Signed)
Subjective:  Seen on HD tolerating 1.5 uf goal so far   Objective Vital signs in last 24 hours: Vitals:   03/15/19 1015 03/15/19 1030 03/15/19 1045 03/15/19 1100  BP: (!) 99/41 111/66 110/64 (!) 117/43  Pulse: 71 74 72 83  Resp: 17 (!) 22 20 18   Temp:      TempSrc:      SpO2:      Weight:      Height:       Weight change:   Physical Exam General: on hd ,NAD.chroniclly ill appearing , Skin very dry. Heart: RRR; no murmur Lungs: CTAB Abdomen: soft, non-tender. Two drains in L flank - now scant  bloody output   Extremities: No LE edema Dialysis Access:  TDC patent on hd/ +bruuit  RUE AVF +   Dialysis Orders: MWF at [facility] 4:15hr, 400/800, EDW 94kg, 3K/2.25Ca, TDC, heparin 3000 bolus - Mircera 136mg IV q 2 weeks (2/10)  [facility] 400/800, EDW 94kg, 3K/2.25Ca, TDC, heparin 3000 bolus - Mircera 1569m IV q 2 weeks (2/10)  Assessment/Plan: 1. 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 Urology consulted - not felt to be candidate for open wash-out, S/p IR drain and Meropenem. ID saw and recommend removal perm cath  = will remove after HD today with Line holiday  Until possible  Monday hd follow up labs  2. ESRD:Will continue HD on MWF schedule -  K. 3.4 this am /4K bath  3. Hypertension/volume:BP low/stable - no excess volume on exam. 4. Anemia:Hgb 8.4>8.0  - just given ESA as outpt - follow for now. Repeat iron studies.low iron  Hold IV iron load in setting of Infection  5. Metabolic bone disease:CorrCa 8.9 ,  Phos 4.0  - continue home binders (Phoslo). 6. T2DM- per admit  7. R kidney stone: Per CT - non-obstructing at the moment, asymptomatic.  DaErnest HaberPA-C [facility] Beeper [phone]/19/2021,11:17 AM  LOS: 2 days   Labs: Basic Metabolic Panel: Recent Labs  Lab 03/13/19 1230 03/14/19 0154 03/15/19 0241  NA 136 134* 136  K 3.2* 2.9* 3.4*  CL  103 98 101  CO2 21* 24 23  GLUCOSE 102* 278* 275*  BUN 43* 16 27*  CREATININE 5.91* 3.21* 4.49*  CALCIUM 7.8* 7.5* 7.7*  PHOS 5.6*  --  4.0   Liver Function Tests: Recent Labs  Lab 03/12/19 2051 03/12/19 2051 03/13/19 0423 03/13/19 1230 03/15/19 0241  AST 10*  --  10*  --   --   ALT 8  --  7  --   --   ALKPHOS 124  --  108  --   --   BILITOT 0.8  --  0.4  --   --   PROT 6.6  --  5.6*  --   --   ALBUMIN 1.8*   < > 1.5* 1.5* 1.5*   < > = values in this interval not displayed.   Recent Labs  Lab 03/12/19 2051  LIPASE 13   Recent Labs  Lab 03/12/19 2051  AMMONIA 61*   CBC: Recent Labs  Lab 03/12/19 2331 03/12/19 2331 03/13/19 0423 03/14/19 0154 03/15/19 0241  WBC 8.5   < > 8.5 9.1 6.0  NEUTROABS 7.0  --   --   --   --   HGB 8.3*   < > 8.2* 8.4* 8.0*  HCT 25.9*   < > 25.3* 25.6* 25.1*  MCV 94.2  --  93.0 92.4 95.4  PLT 228   < > 216 200 198   < > = values in this interval not displayed.   Cardiac Enzymes: No results for input(s): CKTOTAL, CKMB, CKMBINDEX, TROPONINI in the last 168 hours. CBG: Recent Labs  Lab 03/14/19 1752 03/14/19 2003 03/14/19 2357 03/15/19 0435 03/15/19 0809  GLUCAP 209* 181* 248* 260* 150*    Studies/Results: CT IMAGE GUIDED FLUID DRAIN BY CATHETER  Result Date: 03/14/2019 INDICATION: 49 year old with history of nephrectomy and recurrent left retroperitoneal abscess. EXAM: CT-GUIDED PLACEMENT OF LEFT RETROPERITONEAL ABSCESS DRAIN X2 MEDICATIONS: The patient is currently admitted to the hospital and receiving intravenous antibiotics. ANESTHESIA/SEDATION: Fentanyl 50 mcg IV; Versed 1.0 mg IV Moderate Sedation Time:  45 minutes The patient was continuously monitored during the procedure by the interventional radiology nurse under my direct supervision. COMPLICATIONS: None immediate. PROCEDURE: Informed written consent was obtained from the patient after a thorough discussion of the procedural risks, benefits and alternatives. All  questions were addressed. Maximal Sterile Barrier Technique was utilized including caps, mask, sterile gowns, sterile gloves, sterile drape, hand hygiene and skin antiseptic. A timeout was performed prior to the initiation of the procedure. Patient was placed prone. CT images through the abdomen were obtained. Large air-fluid collection in the left retroperitoneum. The left flank was prepped with chlorhexidine and sterile field was created. Skin and soft tissues were anesthetized with 1% lidocaine. Using CT guidance, 18 gauge trocar needle was directed into the large collection. Yellow purulent fluid was aspirated. Superstiff Amplatz wire was placed through the needle and the tract was dilated to accommodate a 14 [place] biliary type drain. Large amount of yellow purulent fluid was removed. Follow up CT images were obtained. CT images demonstrated residual air-fluid collection posterior to the spleen and upper portion of the collection. Skin was anesthetized with 1% lidocaine and a new incision was made. New 18 gauge trocar needle was directed into the upper portion of the residual collection with CT guidance. Large amount of air was aspirated. Superstiff Amplatz wire was advanced into the collection and the tract was dilated to accommodate a 12 [place] multipurpose drain. Large amount of gas and bloody purulent fluid was aspirated. Follow up CT images were obtained. Both catheters sutured to skin and attached to a suction bulb. StatLock was placed. FINDINGS: Large complex air-fluid collection in the left retroperitoneum with some extension into the left lateral subcutaneous tissues. A 14 French biliary type drain was placed in the midportion of the collection. 430 mL of yellow purulent fluid was removed from this drain. Follow up CT images demonstrated residual air-fluid collection along the superior aspect of the collection posterior to the spleen. Second drain was placed in the superior aspect of the collection and  100 mL purulent fluid and a large amount of gas was removed. Final CT images demonstrate marked decompression of the abscess cavities. A small amount of residual gas and fluid. Again noted is high-density material in the right renal collecting system compatible with nephrolithiasis. There is a focal stone at the right UPJ that measures roughly 1.3 cm. IMPRESSION: CT-guided placement of two percutaneous drains within the large left retroperitoneal abscess. Total of 530 mL of purulent fluid was removed from the abscess. Fluid was sent for culture. Patient will need follow-up CT images when the drainage decreases to look for any residual collections. Electronically Signed   By: Markus Daft M.D.   On: 03/14/2019 14:10   CT IMAGE GUIDED DRAINAGE BY PERCUTANEOUS CATHETER  Result  Date: 03/14/2019 INDICATION: 49 year old with history of nephrectomy and recurrent left retroperitoneal abscess. EXAM: CT-GUIDED PLACEMENT OF LEFT RETROPERITONEAL ABSCESS DRAIN X2 MEDICATIONS: The patient is currently admitted to the hospital and receiving intravenous antibiotics. ANESTHESIA/SEDATION: Fentanyl 50 mcg IV; Versed 1.0 mg IV Moderate Sedation Time:  45 minutes The patient was continuously monitored during the procedure by the interventional radiology nurse under my direct supervision. COMPLICATIONS: None immediate. PROCEDURE: Informed written consent was obtained from the patient after a thorough discussion of the procedural risks, benefits and alternatives. All questions were addressed. Maximal Sterile Barrier Technique was utilized including caps, mask, sterile gowns, sterile gloves, sterile drape, hand hygiene and skin antiseptic. A timeout was performed prior to the initiation of the procedure. Patient was placed prone. CT images through the abdomen were obtained. Large air-fluid collection in the left retroperitoneum. The left flank was prepped with chlorhexidine and sterile field was created. Skin and soft tissues were  anesthetized with 1% lidocaine. Using CT guidance, 18 gauge trocar needle was directed into the large collection. Yellow purulent fluid was aspirated. Superstiff Amplatz wire was placed through the needle and the tract was dilated to accommodate a 14 [place] biliary type drain. Large amount of yellow purulent fluid was removed. Follow up CT images were obtained. CT images demonstrated residual air-fluid collection posterior to the spleen and upper portion of the collection. Skin was anesthetized with 1% lidocaine and a new incision was made. New 18 gauge trocar needle was directed into the upper portion of the residual collection with CT guidance. Large amount of air was aspirated. Superstiff Amplatz wire was advanced into the collection and the tract was dilated to accommodate a 12 [place] multipurpose drain. Large amount of gas and bloody purulent fluid was aspirated. Follow up CT images were obtained. Both catheters sutured to skin and attached to a suction bulb. StatLock was placed. FINDINGS: Large complex air-fluid collection in the left retroperitoneum with some extension into the left lateral subcutaneous tissues. A 14 French biliary type drain was placed in the midportion of the collection. 430 mL of yellow purulent fluid was removed from this drain. Follow up CT images demonstrated residual air-fluid collection along the superior aspect of the collection posterior to the spleen. Second drain was placed in the superior aspect of the collection and 100 mL purulent fluid and a large amount of gas was removed. Final CT images demonstrate marked decompression of the abscess cavities. A small amount of residual gas and fluid. Again noted is high-density material in the right renal collecting system compatible with nephrolithiasis. There is a focal stone at the right UPJ that measures roughly 1.3 cm. IMPRESSION: CT-guided placement of two percutaneous drains within the large left retroperitoneal abscess. Total of  530 mL of purulent fluid was removed from the abscess. Fluid was sent for culture. Patient will need follow-up CT images when the drainage decreases to look for any residual collections. Electronically Signed   By: Markus Daft M.D.   On: 03/14/2019 14:10   Medications: . meropenem (MERREM) IV 500 mg (03/14/19 2123)   . allopurinol  100 mg Oral Daily  . calcium acetate  2,001 mg Oral TID WC  . carbamazepine  900 mg Oral BID  . Chlorhexidine Gluconate Cloth  6 each Topical Q0600  . ferrous sulfate  325 mg Oral Q breakfast  . FLUoxetine  20 mg Oral Daily  . insulin aspart  0-9 Units Subcutaneous Q4H  . insulin detemir  8 Units Subcutaneous Daily  . lactulose  20 g Oral BID  . multivitamin with minerals  1 tablet Oral QHS  . pantoprazole  40 mg Oral Daily  . PHENobarbital  259.2 mg Oral QHS  . simvastatin  20 mg Oral QHS  . sodium chloride flush  5 mL Intracatheter Q8H  . tamsulosin  0.4 mg Oral QHS

## 2019-03-16 LAB — CBC
HCT: 25.2 % — ABNORMAL LOW (ref 39.0–52.0)
Hemoglobin: 8 g/dL — ABNORMAL LOW (ref 13.0–17.0)
MCH: 29.9 pg (ref 26.0–34.0)
MCHC: 31.7 g/dL (ref 30.0–36.0)
MCV: 94 fL (ref 80.0–100.0)
Platelets: 184 10*3/uL (ref 150–400)
RBC: 2.68 MIL/uL — ABNORMAL LOW (ref 4.22–5.81)
RDW: 16.7 % — ABNORMAL HIGH (ref 11.5–15.5)
WBC: 5.3 10*3/uL (ref 4.0–10.5)
nRBC: 0 % (ref 0.0–0.2)

## 2019-03-16 LAB — BASIC METABOLIC PANEL
Anion gap: 10 (ref 5–15)
BUN: 22 mg/dL — ABNORMAL HIGH (ref 6–20)
CO2: 25 mmol/L (ref 22–32)
Calcium: 7.7 mg/dL — ABNORMAL LOW (ref 8.9–10.3)
Chloride: 101 mmol/L (ref 98–111)
Creatinine, Ser: 3.49 mg/dL — ABNORMAL HIGH (ref 0.61–1.24)
GFR calc Af Amer: 23 mL/min — ABNORMAL LOW (ref >60)
GFR calc non Af Amer: 20 mL/min — ABNORMAL LOW (ref >60)
Glucose, Bld: 293 mg/dL — ABNORMAL HIGH (ref 70–99)
Potassium: 3 mmol/L — ABNORMAL LOW (ref 3.5–5.1)
Sodium: 136 mmol/L (ref 135–145)

## 2019-03-16 LAB — GLUCOSE, CAPILLARY
Glucose-Capillary: 186 mg/dL — ABNORMAL HIGH (ref 70–99)
Glucose-Capillary: 245 mg/dL — ABNORMAL HIGH (ref 70–99)
Glucose-Capillary: 251 mg/dL — ABNORMAL HIGH (ref 70–99)
Glucose-Capillary: 255 mg/dL — ABNORMAL HIGH (ref 70–99)
Glucose-Capillary: 371 mg/dL — ABNORMAL HIGH (ref 70–99)
Glucose-Capillary: 87 mg/dL (ref 70–99)

## 2019-03-16 LAB — RENAL FUNCTION PANEL
Albumin: 1.6 g/dL — ABNORMAL LOW (ref 3.5–5.0)
Anion gap: 11 (ref 5–15)
BUN: 16 mg/dL (ref 6–20)
CO2: 25 mmol/L (ref 22–32)
Calcium: 7.7 mg/dL — ABNORMAL LOW (ref 8.9–10.3)
Chloride: 101 mmol/L (ref 98–111)
Creatinine, Ser: 2.78 mg/dL — ABNORMAL HIGH (ref 0.61–1.24)
GFR calc Af Amer: 30 mL/min — ABNORMAL LOW (ref >60)
GFR calc non Af Amer: 26 mL/min — ABNORMAL LOW (ref >60)
Glucose, Bld: 188 mg/dL — ABNORMAL HIGH (ref 70–99)
Phosphorus: 2 mg/dL — ABNORMAL LOW (ref 2.5–4.6)
Potassium: 2.7 mmol/L — CL (ref 3.5–5.1)
Sodium: 137 mmol/L (ref 135–145)

## 2019-03-16 MED ORDER — POTASSIUM CHLORIDE CRYS ER 20 MEQ PO TBCR
40.0000 meq | EXTENDED_RELEASE_TABLET | Freq: Once | ORAL | Status: AC
  Administered 2019-03-16: 40 meq via ORAL
  Filled 2019-03-16: qty 2

## 2019-03-16 MED ORDER — K PHOS MONO-SOD PHOS DI & MONO 155-852-130 MG PO TABS
500.0000 mg | ORAL_TABLET | Freq: Two times a day (BID) | ORAL | Status: AC
  Filled 2019-03-16 (×2): qty 2

## 2019-03-16 MED ORDER — POTASSIUM PHOSPHATE MONOBASIC 500 MG PO TABS
500.0000 mg | ORAL_TABLET | Freq: Two times a day (BID) | ORAL | Status: DC
  Filled 2019-03-16: qty 1

## 2019-03-16 NOTE — Progress Notes (Signed)
Subjective:  No cos , Permcath removed after HD yest for line Holiday.  Objective Vital signs in last 24 hours: Vitals:   03/15/19 1330 03/15/19 1456 03/15/19 2021 03/16/19 0452  BP: (!) 103/59 119/68 125/76 115/72  Pulse: 88 96 84 73  Resp: 18 18 18 18   Temp: (!) 97.4 F (36.3 C) 97.6 F (36.4 C) 99 F (37.2 C) 97.7 F (36.5 C)  TempSrc: Axillary Oral Oral Oral  SpO2: 100% 100% 100% 100%  Weight: 88.2 kg     Height:       Weight change:   Physical Exam General:,NAD.chroniclly ill appearing male  , Skin very dry. Heart:RRR; no murmur Lungs:CTAB Abdomen:soft, non-tender. Two drains in L flank - now scant  bloody output Extremities:trace LE edema Dialysis Access:TDC out with site dressing dry /clean //  + bruit   RUE AVF +   Dialysis Orders: MWF at [facility] 4:15hr, 400/800, EDW 94kg, 3K/2.25Ca, TDC, heparin 3000 bolus - Mircera 113mg IV q 2 weeks (2/10)  [facility], EDW 94kg, 3K/2.25Ca, TDC, heparin 3000 bolus - Mircera 1592m IV q 2 weeks (2/10)  Assessment/Plan: 1. 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 Urology consulted - not felt to be candidate for open wash-out,S/pIR drain and Meropenem. ID saw and recommend removal perm cath  = removed 03/15/19 for  Line holiday  Until possible  Monday //follow up labs  2. ESRD:Will continue HD on MWF schedule- used 4kbath yest on hd ,  K. 2.7   this am @1 :35AM  Will give po supplement / fu labs  3. Hypertension/volume:BPstable - no excess volume on exam below edw yest  To 88.2 kg  BUT with pre wt 89.7  wts not ACCURATE  With 1676 cc uf total. . 4. Anemia:Hgb 8.4>8.0 >8.0 this am - j given ESA as outpt 03/06/19  - follow for now. Due on 24th Repeat iron studies.low iron  Hold IV iron load in setting of Infection  5. Metabolic bone disease:CorrCa 8.9 , Phos 4.0  >2.0 will hold phoslo binder give supplement  - 6. T2DM- per admit  7. R kidney stone: Per CT - non-obstructing at the moment, asymptomatic.  DaErnest HaberPA-C [facility] Beeper [phone]/20/2021,10:38 AM  LOS: 3 days   Labs: Basic Metabolic Panel: Recent Labs  Lab 03/13/19 1230 03/13/19 1230 03/14/19 0154 03/15/19 0241 03/16/19 0135  NA 136   < > 134* 136 137  K 3.2*   < > 2.9* 3.4* 2.7*  CL 103   < > 98 101 101  CO2 21*   < > 24 23 25   GLUCOSE 102*   < > 278* 275* 188*  BUN 43*   < > 16 27* 16  CREATININE 5.91*   < > 3.21* 4.49* 2.78*  CALCIUM 7.8*   < > 7.5* 7.7* 7.7*  PHOS 5.6*  --   --  4.0 2.0*   < > = values in this interval not displayed.   Liver Function Tests: Recent Labs  Lab 03/12/19 2051 03/12/19 2051 03/13/19 0423 03/13/19 0423 03/13/19 1230 03/15/19 0241 03/16/19 0135  AST 10*  --  10*  --   --   --   --   ALT 8  --  7  --   --   --   --   ALKPHOS 124  --  108  --   --   --   --   BILITOT 0.8  --  0.4  --   --   --   --   PROT 6.6  --  5.6*  --   --   --   --   ALBUMIN 1.8*   < > 1.5*   < > 1.5* 1.5* 1.6*   < > = values in this interval not displayed.   Recent Labs  Lab 03/12/19 2051  LIPASE 13   Recent Labs  Lab 03/12/19 2051  AMMONIA 61*   CBC: Recent Labs  Lab 03/12/19 2331 03/12/19 2331 03/13/19 0423 03/13/19 0423 03/14/19 0154 03/15/19 0241 03/16/19 0135  WBC 8.5   < > 8.5   < > 9.1 6.0 5.3  NEUTROABS 7.0  --   --   --   --   --   --   HGB 8.3*   < > 8.2*   < > 8.4* 8.0* 8.0*  HCT 25.9*   < > 25.3*   < > 25.6* 25.1* 25.2*  MCV 94.2  --  93.0  --  92.4 95.4 94.0  PLT 228   < > 216   < > 200 198 184   < > = values in this interval not displayed.   Cardiac Enzymes: No results for input(s): CKTOTAL, CKMB, CKMBINDEX, TROPONINI in the last 168 hours. CBG: Recent Labs  Lab 03/15/19 1547 03/15/19 2019 03/16/19 0006 03/16/19 0450 03/16/19 0810  GLUCAP 214* 199* 251* 87 186*    Studies/Results: IR Removal Tun Cv Cath W/O FL  Result Date:  03/15/2019 INDICATION: End-stage renal disease on hemodialysis. Retroperitoneal abscesses status post drain placement. Bacteremia. Request for removal of tunneled hemodialysis catheter. EXAM: REMOVAL OF TUNNELED HEMODIALYSIS CATHETER MEDICATIONS: 1% lidocaine 2 mL COMPLICATIONS: None immediate. PROCEDURE: Informed written consent was obtained from the patient following an explanation of the procedure, risks, benefits and alternatives to treatment. A time out was performed prior to the initiation of the procedure. Maximal barrier sterile technique was utilized including caps, mask, sterile gowns, sterile gloves, large sterile drape, hand hygiene, and ChloraPrep. 1% lidocaine with epinephrine was injected under sterile conditions along the subcutaneous tunnel. Utilizing a combination of blunt dissection and gentle traction, the catheter was removed intact. Hemostasis was obtained with manual compression. A dressing was placed. The patient tolerated the procedure well without immediate post procedural complication. IMPRESSION: Successful removal of tunneled dialysis catheter. Read by: Gareth Eagle, [name] Electronically Signed   By: Jerilynn Mages.  Shick M.D.   On: 03/15/2019 14:21   CT IMAGE GUIDED FLUID DRAIN BY CATHETER  Result Date: 03/14/2019 INDICATION: 49 year old with history of nephrectomy and recurrent left retroperitoneal abscess. EXAM: CT-GUIDED PLACEMENT OF LEFT RETROPERITONEAL ABSCESS DRAIN X2 MEDICATIONS: The patient is currently admitted to the hospital and receiving intravenous antibiotics. ANESTHESIA/SEDATION: Fentanyl 50 mcg IV; Versed 1.0 mg IV Moderate Sedation Time:  45 minutes The patient was continuously monitored during the procedure by the interventional radiology nurse under my direct supervision. COMPLICATIONS: None immediate. PROCEDURE: Informed written consent was obtained from the patient after a thorough discussion of the procedural risks, benefits and alternatives. All questions were addressed.  Maximal Sterile Barrier Technique was utilized including caps, mask, sterile gowns, sterile gloves, sterile drape, hand hygiene and skin antiseptic. A timeout was performed prior to the initiation of the procedure. Patient was placed prone. CT images through the abdomen were obtained. Large air-fluid collection in the left retroperitoneum. The left flank was prepped with chlorhexidine and sterile field was created. Skin and soft tissues were anesthetized with 1% lidocaine. Using CT  guidance, 18 gauge trocar needle was directed into the large collection. Yellow purulent fluid was aspirated. Superstiff Amplatz wire was placed through the needle and the tract was dilated to accommodate a 14 [place] biliary type drain. Large amount of yellow purulent fluid was removed. Follow up CT images were obtained. CT images demonstrated residual air-fluid collection posterior to the spleen and upper portion of the collection. Skin was anesthetized with 1% lidocaine and a new incision was made. New 18 gauge trocar needle was directed into the upper portion of the residual collection with CT guidance. Large amount of air was aspirated. Superstiff Amplatz wire was advanced into the collection and the tract was dilated to accommodate a 12 [place] multipurpose drain. Large amount of gas and bloody purulent fluid was aspirated. Follow up CT images were obtained. Both catheters sutured to skin and attached to a suction bulb. StatLock was placed. FINDINGS: Large complex air-fluid collection in the left retroperitoneum with some extension into the left lateral subcutaneous tissues. A 14 French biliary type drain was placed in the midportion of the collection. 430 mL of yellow purulent fluid was removed from this drain. Follow up CT images demonstrated residual air-fluid collection along the superior aspect of the collection posterior to the spleen. Second drain was placed in the superior aspect of the collection and 100 mL purulent fluid and  a large amount of gas was removed. Final CT images demonstrate marked decompression of the abscess cavities. A small amount of residual gas and fluid. Again noted is high-density material in the right renal collecting system compatible with nephrolithiasis. There is a focal stone at the right UPJ that measures roughly 1.3 cm. IMPRESSION: CT-guided placement of two percutaneous drains within the large left retroperitoneal abscess. Total of 530 mL of purulent fluid was removed from the abscess. Fluid was sent for culture. Patient will need follow-up CT images when the drainage decreases to look for any residual collections. Electronically Signed   By: Markus Daft M.D.   On: 03/14/2019 14:10   CT IMAGE GUIDED DRAINAGE BY PERCUTANEOUS CATHETER  Result Date: 03/14/2019 INDICATION: 49 year old with history of nephrectomy and recurrent left retroperitoneal abscess. EXAM: CT-GUIDED PLACEMENT OF LEFT RETROPERITONEAL ABSCESS DRAIN X2 MEDICATIONS: The patient is currently admitted to the hospital and receiving intravenous antibiotics. ANESTHESIA/SEDATION: Fentanyl 50 mcg IV; Versed 1.0 mg IV Moderate Sedation Time:  45 minutes The patient was continuously monitored during the procedure by the interventional radiology nurse under my direct supervision. COMPLICATIONS: None immediate. PROCEDURE: Informed written consent was obtained from the patient after a thorough discussion of the procedural risks, benefits and alternatives. All questions were addressed. Maximal Sterile Barrier Technique was utilized including caps, mask, sterile gowns, sterile gloves, sterile drape, hand hygiene and skin antiseptic. A timeout was performed prior to the initiation of the procedure. Patient was placed prone. CT images through the abdomen were obtained. Large air-fluid collection in the left retroperitoneum. The left flank was prepped with chlorhexidine and sterile field was created. Skin and soft tissues were anesthetized with 1% lidocaine.  Using CT guidance, 18 gauge trocar needle was directed into the large collection. Yellow purulent fluid was aspirated. Superstiff Amplatz wire was placed through the needle and the tract was dilated to accommodate a 14 [place] biliary type drain. Large amount of yellow purulent fluid was removed. Follow up CT images were obtained. CT images demonstrated residual air-fluid collection posterior to the spleen and upper portion of the collection. Skin was anesthetized with 1% lidocaine and a new  incision was made. New 18 gauge trocar needle was directed into the upper portion of the residual collection with CT guidance. Large amount of air was aspirated. Superstiff Amplatz wire was advanced into the collection and the tract was dilated to accommodate a 12 [place] multipurpose drain. Large amount of gas and bloody purulent fluid was aspirated. Follow up CT images were obtained. Both catheters sutured to skin and attached to a suction bulb. StatLock was placed. FINDINGS: Large complex air-fluid collection in the left retroperitoneum with some extension into the left lateral subcutaneous tissues. A 14 French biliary type drain was placed in the midportion of the collection. 430 mL of yellow purulent fluid was removed from this drain. Follow up CT images demonstrated residual air-fluid collection along the superior aspect of the collection posterior to the spleen. Second drain was placed in the superior aspect of the collection and 100 mL purulent fluid and a large amount of gas was removed. Final CT images demonstrate marked decompression of the abscess cavities. A small amount of residual gas and fluid. Again noted is high-density material in the right renal collecting system compatible with nephrolithiasis. There is a focal stone at the right UPJ that measures roughly 1.3 cm. IMPRESSION: CT-guided placement of two percutaneous drains within the large left retroperitoneal abscess. Total of 530 mL of purulent fluid was  removed from the abscess. Fluid was sent for culture. Patient will need follow-up CT images when the drainage decreases to look for any residual collections. Electronically Signed   By: Markus Daft M.D.   On: 03/14/2019 14:10   Medications: . meropenem (MERREM) IV Stopped (03/15/19 2152)   . allopurinol  100 mg Oral Daily  . calcium acetate  2,001 mg Oral TID WC  . carbamazepine  900 mg Oral BID  . Chlorhexidine Gluconate Cloth  6 each Topical Q0600  . feeding supplement (NEPRO CARB STEADY)  237 mL Oral TID BM  . ferrous sulfate  325 mg Oral Q breakfast  . FLUoxetine  20 mg Oral Daily  . insulin aspart  0-9 Units Subcutaneous Q4H  . insulin detemir  8 Units Subcutaneous Daily  . lactulose  20 g Oral BID  . multivitamin with minerals  1 tablet Oral QHS  . pantoprazole  40 mg Oral Daily  . PHENobarbital  259.2 mg Oral QHS  . simvastatin  20 mg Oral QHS  . sodium chloride flush  5 mL Intracatheter Q8H  . tamsulosin  0.4 mg Oral QHS

## 2019-03-16 NOTE — Progress Notes (Signed)
Lab called critical Potassium value of 2.7.  Provider Bodenheimer texted paged and notified of critical lab value.

## 2019-03-16 NOTE — Progress Notes (Signed)
Referring Physician(s): Sheran Luz  Supervising Physician: Daryll Brod  Patient Status:  [facility] - In-pt  Chief Complaint: Retroperitoneal fluid collection S/P drains x 2 by Dr.Henn 03/14/19   Subjective: Pt without new c/o; some minimal soreness at RP drain sites; denies N/V   Allergies: Fish oil  Medications: Prior to Admission medications   Medication Sig Start Date End Date Taking? Authorizing Provider  acetaminophen (TYLENOL) 325 MG tablet Take 650 mg by mouth every 8 (eight) hours as needed for fever (pain).   Yes [provider]  allopurinol (ZYLOPRIM) 100 MG tablet Take 100 mg by mouth daily.  08/01/18  Yes [provider]  calcium acetate (PHOSLO) 667 MG capsule Take 2,001 mg by mouth 3 (three) times daily with meals.    Yes [provider]  carbamazepine (CARBATROL) 100 MG 12 hr capsule Take 100 mg by mouth 2 (two) times daily. 02/23/19  Yes [provider]  carbamazepine (TEGRETOL XR) 400 MG 12 hr tablet Take 800 mg by mouth 2 (two) times daily.   Yes [provider]  cloNIDine (CATAPRES) 0.1 MG tablet Take 0.1 mg by mouth 2 (two) times daily as needed (SBP ABOVE 170).  07/25/18  Yes [provider]  DULoxetine (CYMBALTA) 20 MG capsule Take 20 mg by mouth daily.   Yes [provider]  ferrous sulfate 325 (65 FE) MG tablet Take 325 mg by mouth daily with breakfast.   Yes [provider]  guaiFENesin (MUCINEX) 600 MG 12 hr tablet Take 600 mg by mouth every 12 (twelve) hours as needed for cough (congestion).   Yes [provider]  insulin aspart (NOVOLOG) 100 UNIT/ML injection Inject 0-15 Units into the skin 4 (four) times daily -  with meals and at bedtime. 70-120=0 units, 121-150=2 units, 151-200=3 units, 201-250=5 units, 251-300=8 units, 301-350=11 units, 351-400=15 units, >400 call MD   Yes [provider]  Insulin Glargine (BASAGLAR KWIKPEN) 100 UNIT/ML SOPN Inject 0.28 mLs (28 Units  total) into the skin at bedtime. Patient taking differently: Inject 20 Units into the skin at bedtime.  12/05/18 03/13/19 Yes [place] Eric J, DO  Ipratropium-Albuterol (COMBIVENT) 20-100 MCG/ACT AERS respimat Inhale 1 puff into the lungs every 6 (six) hours as needed for wheezing.   Yes [provider]  lactulose (CHRONULAC) 10 GM/15ML solution Take 15 mLs (10 g total) by mouth 2 (two) times daily. Patient taking differently: Take 30 g by mouth 2 (two) times daily.  05/31/18  Yes [name], [name] K, MD  lidocaine-prilocaine (EMLA) cream Apply 1 application topically every Monday, Wednesday, and Friday.   Yes [provider]  Multiple Vitamin (MULTIVITAMIN WITH MINERALS) TABS tablet Take 1 tablet by mouth at bedtime.   Yes [provider]  omeprazole (PRILOSEC) 20 MG capsule Take 20 mg by mouth daily.  07/04/18  Yes [provider]  ondansetron (ZOFRAN) 4 MG tablet Take 4 mg by mouth every 8 (eight) hours as needed for nausea or vomiting.  08/03/18  Yes [provider]  PHENobarbital (LUMINAL) 64.8 MG tablet Take 4 tablets (259.2 mg total) by mouth at bedtime. 12/05/18 03/13/19 Yes [place] Eric J, DO  PROTEIN PO Take 30 mLs by mouth in the morning and at bedtime.   Yes [provider]  simvastatin (ZOCOR) 20 MG tablet Take 20 mg by mouth at bedtime.    Yes [provider]  sodium bicarbonate 650 MG tablet Take 650 mg by mouth 3 (three) times daily.   Yes [provider]  tamsulosin (FLOMAX) 0.4 MG CAPS capsule Take 0.4 mg by mouth at bedtime.   Yes [provider]  Darbepoetin Alfa (ARANESP) 200 MCG/0.4ML SOSY injection Inject 0.4 mLs (200 mcg total) into the vein every Thursday with hemodialysis. 06/28/18   Amin, Jeanella Flattery, MD  HYDROcodone-acetaminophen (NORCO) 5-325 MG tablet Take 1 tablet by mouth every 6 (six) hours as needed for moderate pain. 12/05/18   [place] Donnamarie Poag, DO     Vital Signs: BP 126/73 (BP Location: Left  Arm)   Pulse 78   Temp 97.7 F (36.5 C) (Oral)   Resp 16   Ht 6' 2"  (1.88 m)   Wt 194 lb 7.1 oz (88.2 kg)   SpO2 100%   BMI 24.97 kg/m   Physical Exam awake/alert; left RP drains intact, OP ranging from 5-40 cc bloody fluid, insertion sites mildly tender; prev rt neck HD cath site ok  Imaging: CT ABDOMEN PELVIS W CONTRAST  Result Date: 03/12/2019 CLINICAL DATA:  Pyelonephritis, sepsis, renal stent, left nephrectomy EXAM: CT ABDOMEN AND PELVIS WITH CONTRAST TECHNIQUE: Multidetector CT imaging of the abdomen and pelvis was performed using the standard protocol following bolus administration of intravenous contrast. CONTRAST:  174m OMNIPAQUE IOHEXOL 300 MG/ML  SOLN COMPARISON:  02/04/2019 FINDINGS: Lower chest: Small bilateral pleural effusions. Coronary artery calcifications and/or stents. Hepatobiliary: No focal liver abnormality is seen. Status post cholecystectomy. No biliary dilatation. Pancreas: Atrophic pancreas with parenchymal calcifications, in keeping with chronic stigmata of pancreatitis. No pancreatic ductal dilatation or surrounding inflammatory changes. Spleen: Splenomegaly. Adrenals/Urinary Tract: Adrenal glands are unremarkable. Redemonstrated postoperative findings of left nephrectomy. There has been interval enlargement of a left retroperitoneal air and fluid collection involving the left psoas, now measuring at least 17.4 x 5.9 cm (series 3, image 53). A previously seen right-sided double-J ureteral stent is been removed. A 1.3 cm calculus remains in the right renal pelvis with additional small right-sided calyceal calculi. Bladder is unremarkable. Stomach/Bowel: Stomach is within normal limits. No evidence of bowel wall thickening, distention, or inflammatory changes. Large stool balls in the distal sigmoid and rectum. Vascular/Lymphatic: No significant vascular findings are present. Multiple prominent retroperitoneal lymph nodes similar to prior examination. Reproductive: No mass  or other significant abnormality. Other: Anasarca. No abdominopelvic ascites. Musculoskeletal: No acute or significant osseous findings. Intramedullary nail fixation of the left femoral neck. IMPRESSION: 1. Status post left nephrectomy. Interval enlargement of left retroperitoneal air and fluid collection involving the left psoas, now measuring at least 17.4 x 5.9 cm and consistent with worsened abscess. 2. Interval removal of right-sided double-J ureteral stent. A 1.3 cm calculus remains in the right renal pelvis with additional small right-sided calyceal calculi. No hydronephrosis. 3. Large stool balls in the distal sigmoid and rectum. 4. Small bilateral pleural effusions. 5. Anasarca. 6. Splenomegaly. 7. Calcific stigmata of chronic pancreatitis. Electronically Signed   By: AEddie CandleM.D.   On: 03/12/2019 23:19   IR Removal Tun Cv Cath W/O FL  Result Date: 03/15/2019 INDICATION: End-stage renal disease on hemodialysis. Retroperitoneal abscesses status post drain placement. Bacteremia. Request for removal of tunneled hemodialysis catheter. EXAM: REMOVAL OF TUNNELED HEMODIALYSIS CATHETER MEDICATIONS: 1% lidocaine 2 mL COMPLICATIONS: None immediate. PROCEDURE: Informed written consent was obtained from the patient following an explanation of the procedure, risks, benefits and alternatives to treatment. A time out was performed prior to the initiation of the procedure. Maximal barrier sterile technique was utilized including caps, mask, sterile gowns, sterile gloves, large sterile drape, hand hygiene,  and ChloraPrep. 1% lidocaine with epinephrine was injected under sterile conditions along the subcutaneous tunnel. Utilizing a combination of blunt dissection and gentle traction, the catheter was removed intact. Hemostasis was obtained with manual compression. A dressing was placed. The patient tolerated the procedure well without immediate post procedural complication. IMPRESSION: Successful removal of tunneled  dialysis catheter. Read by: Gareth Eagle, [name] Electronically Signed   By: Jerilynn Mages.  Shick M.D.   On: 03/15/2019 14:21   DG Chest Port 1 View  Result Date: 03/12/2019 CLINICAL DATA:  49 year old male with chest pain shortness of breath fever nausea vomiting. EXAM: PORTABLE CHEST 1 VIEW COMPARISON:  Portable chest 12/25/2018 and earlier. FINDINGS: Portable AP semi upright view at 2234 hours. Lower lung volumes. Mediastinal contours remain within normal limits. Stable dual lumen right chest dialysis type catheter. Visualized tracheal air column is within normal limits. Crowding of lung markings. No pneumothorax, pleural effusion or consolidation. Increased pulmonary vascular congestion but no overt edema. No acute osseous abnormality identified. IMPRESSION: Lower lung volumes and increased pulmonary vascular congestion since December. No other acute cardiopulmonary abnormality. Electronically Signed   By: Genevie Ann M.D.   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 EXAM: CT-GUIDED PLACEMENT OF LEFT RETROPERITONEAL ABSCESS DRAIN X2 MEDICATIONS: The patient is currently admitted to the hospital and receiving intravenous antibiotics. ANESTHESIA/SEDATION: Fentanyl 50 mcg IV; Versed 1.0 mg IV Moderate Sedation Time:  45 minutes The patient was continuously monitored during the procedure by the interventional radiology nurse under my direct supervision. COMPLICATIONS: None immediate. PROCEDURE: Informed written consent was obtained from the patient after a thorough discussion of the procedural risks, benefits and alternatives. All questions were addressed. Maximal Sterile Barrier Technique was utilized including caps, mask, sterile gowns, sterile gloves, sterile drape, hand hygiene and skin antiseptic. A timeout was performed prior to the initiation of the procedure. Patient was placed  prone. CT images through the abdomen were obtained. Large air-fluid collection in the left retroperitoneum. The left flank was prepped with chlorhexidine and sterile field was created. Skin and soft tissues were anesthetized with 1% lidocaine. Using CT guidance, 18 gauge trocar needle was directed into the large collection. Yellow purulent fluid was aspirated. Superstiff Amplatz wire was placed through the needle and the tract was dilated to accommodate a 14 [place] biliary type drain. Large amount of yellow purulent fluid was removed. Follow up CT images were obtained. CT images demonstrated residual air-fluid collection posterior to the spleen and upper portion of the collection. Skin was anesthetized with 1% lidocaine and a new incision was made. New 18 gauge trocar needle was directed into the upper portion of the residual collection with CT guidance. Large amount of air was aspirated. Superstiff Amplatz wire was advanced into the collection and the tract was dilated to accommodate a 12 [place] multipurpose drain. Large amount of gas and bloody purulent fluid was aspirated. Follow up CT images were obtained. Both catheters sutured to skin and attached to a suction bulb. StatLock was placed. FINDINGS: Large complex air-fluid collection in the left retroperitoneum with some extension into the left lateral subcutaneous tissues. A 14 French biliary type drain was placed in the midportion of the collection. 430 mL of yellow purulent fluid was removed from this drain. Follow up CT images demonstrated residual air-fluid collection along the superior aspect of the collection posterior to the spleen. Second drain was placed in the  superior aspect of the collection and 100 mL purulent fluid and a large amount of gas was removed. Final CT images demonstrate marked decompression of the abscess cavities. A small amount of residual gas and fluid. Again noted is high-density material in the right renal collecting system  compatible with nephrolithiasis. There is a focal stone at the right UPJ that measures roughly 1.3 cm. IMPRESSION: CT-guided placement of two percutaneous drains within the large left retroperitoneal abscess. Total of 530 mL of purulent fluid was removed from the abscess. Fluid was sent for culture. Patient will need follow-up CT images when the drainage decreases to look for any residual collections. Electronically Signed   By: Markus Daft M.D.   On: 03/14/2019 14:10   CT IMAGE GUIDED DRAINAGE BY PERCUTANEOUS CATHETER  Result Date: 03/14/2019 INDICATION: 49 year old with history of nephrectomy and recurrent left retroperitoneal abscess. EXAM: CT-GUIDED PLACEMENT OF LEFT RETROPERITONEAL ABSCESS DRAIN X2 MEDICATIONS: The patient is currently admitted to the hospital and receiving intravenous antibiotics. ANESTHESIA/SEDATION: Fentanyl 50 mcg IV; Versed 1.0 mg IV Moderate Sedation Time:  45 minutes The patient was continuously monitored during the procedure by the interventional radiology nurse under my direct supervision. COMPLICATIONS: None immediate. PROCEDURE: Informed written consent was obtained from the patient after a thorough discussion of the procedural risks, benefits and alternatives. All questions were addressed. Maximal Sterile Barrier Technique was utilized including caps, mask, sterile gowns, sterile gloves, sterile drape, hand hygiene and skin antiseptic. A timeout was performed prior to the initiation of the procedure. Patient was placed prone. CT images through the abdomen were obtained. Large air-fluid collection in the left retroperitoneum. The left flank was prepped with chlorhexidine and sterile field was created. Skin and soft tissues were anesthetized with 1% lidocaine. Using CT guidance, 18 gauge trocar needle was directed into the large collection. Yellow purulent fluid was aspirated. Superstiff Amplatz wire was placed through the needle and the tract was dilated to accommodate a 14 [place]  biliary type drain. Large amount of yellow purulent fluid was removed. Follow up CT images were obtained. CT images demonstrated residual air-fluid collection posterior to the spleen and upper portion of the collection. Skin was anesthetized with 1% lidocaine and a new incision was made. New 18 gauge trocar needle was directed into the upper portion of the residual collection with CT guidance. Large amount of air was aspirated. Superstiff Amplatz wire was advanced into the collection and the tract was dilated to accommodate a 12 [place] multipurpose drain. Large amount of gas and bloody purulent fluid was aspirated. Follow up CT images were obtained. Both catheters sutured to skin and attached to a suction bulb. StatLock was placed. FINDINGS: Large complex air-fluid collection in the left retroperitoneum with some extension into the left lateral subcutaneous tissues. A 14 French biliary type drain was placed in the midportion of the collection. 430 mL of yellow purulent fluid was removed from this drain. Follow up CT images demonstrated residual air-fluid collection along the superior aspect of the collection posterior to the spleen. Second drain was placed in the superior aspect of the collection and 100 mL purulent fluid and a large amount of gas was removed. Final CT images demonstrate marked decompression of the abscess cavities. A small amount of residual gas and fluid. Again noted is high-density material in the right renal collecting system compatible with nephrolithiasis. There is a focal stone at the right UPJ that measures roughly 1.3 cm. IMPRESSION: CT-guided placement of two percutaneous drains within the large left retroperitoneal  abscess. Total of 530 mL of purulent fluid was removed from the abscess. Fluid was sent for culture. Patient will need follow-up CT images when the drainage decreases to look for any residual collections. Electronically Signed   By: Markus Daft M.D.   On: 03/14/2019 14:10     Labs:  CBC: Recent Labs    03/13/19 0423 03/14/19 0154 03/15/19 0241 03/16/19 0135  WBC 8.5 9.1 6.0 5.3  HGB 8.2* 8.4* 8.0* 8.0*  HCT 25.3* 25.6* 25.1* 25.2*  PLT 216 200 198 184    COAGS: Recent Labs    05/19/18 1339 05/19/18 1339 05/21/18 0419 05/29/18 0500 06/18/18 1319 03/12/19 2331  INR 1.6*   < > 1.7* 1.3* 1.9* 1.7*  APTT 47*  --   --   --   --  39*   < > = values in this interval not displayed.    BMP: Recent Labs    03/14/19 0154 03/15/19 0241 03/16/19 0135 03/16/19 1443  NA 134* 136 137 136  K 2.9* 3.4* 2.7* 3.0*  CL 98 101 101 101  CO2 24 23 25 25   GLUCOSE 278* 275* 188* 293*  BUN 16 27* 16 22*  CALCIUM 7.5* 7.7* 7.7* 7.7*  CREATININE 3.21* 4.49* 2.78* 3.49*  GFRNONAA 22* 14* 26* 20*  GFRAA 25* 17* 30* 23*    LIVER FUNCTION TESTS: Recent Labs    10/23/18 1043 10/23/18 1043 11/27/18 2019 12/03/18 0947 03/12/19 2051 03/12/19 2051 03/13/19 0423 03/13/19 1230 03/15/19 0241 03/16/19 0135  BILITOT 0.4  --  0.7  --  0.8  --  0.4  --   --   --   AST 22  --  17  --  10*  --  10*  --   --   --   ALT 20  --  14  --  8  --  7  --   --   --   ALKPHOS 145*  --  125  --  124  --  108  --   --   --   PROT 7.3  --  6.5  --  6.6  --  5.6*  --   --   --   ALBUMIN 3.1*   < > 2.7*   < > 1.8*   < > 1.5* 1.5* 1.5* 1.6*   < > = values in this interval not displayed.    Assessment and Plan: Pt s/p left RP fluid coll/abscess drains x2 2/18; afebrile; creat 3.49, WBC nl; hgb 8, drain fluid cx pend; cont current tx, monitor labs closely, drain irrigation; obtain f/u CT once drain OP < 10-15 cc/ day for 2-3 days   Electronically Signed: D. Rowe Robert, [name] 03/16/2019, 3:29 PM   I spent a total of 15 minutes at the the patient's bedside AND on the patient's hospital floor or unit, greater than 50% of which was counseling/coordinating care for retroperitoneal fluid collection drains    Patient ID: Eddie Davies, male   DOB: 08/05/1970, 49  y.o.   MRN: [id number]

## 2019-03-16 NOTE — Progress Notes (Signed)
PROGRESS NOTE    Eddie Davies    Code Status: Full Code  XKG:[id number] DOB: 03-21-1970 DOA: 03/12/2019 LOS: 3 days  PCP: [facility] CC:  Chief Complaint  Patient presents with  . Emesis       Hospital Summary  Eddie Davies is a 49 y.o. male with a history of ESRD on HD, ESBL who was admitted early this morning by Dr. Alcario Drought for retroperitoneal abscess from SNF.  He was recently admitted for left-sided pyelonephritis and sepsis status post left-sided radical nephrectomy and right double-J stent placement at OSH who presented with N/V/fever.  Has since been evaluated by nephrology and patient underwent HD today.   From H&P: Patient has been battling K.Pneumo for past several months, recent admits include:  11/3-11/11/2018 at [facility] for sepsis related to L sided pyelo  12/26/2018-01/29/2019 [facility], concern for dialysis cath infection, klebsiella bacteremia, left-sided emphysematous pyelonephritis with right renal pelvis stone, left-sided radical nephrectomy and right stent performed on 01/09/2019, subsequently in ICU on vasopressors, + Covid test on 01/03/2019, uncontrolled blood sugars without DKA  01/12-01/20/21 Admission to [facility] for retroperitoneal fluid collection, IV abx with IR drain placement 02/06/19, with improvement on repeat imaging  03/07/2019 R ureteral stent removal.  2/18: Status post CT-guided drain placement x2.  ID consulted. 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 Left retroperitoneal GNR abscess with history of ESBL Klebsiella bacteremia    1. 2/18: status post CT-guided drain placement x2 via IR 2. 2/19: Tunneled HD catheter removed via IR 3. Currently on meropenem 4. ID on board, appreciate further recommendations 5. Moderate Gram Negative Rods on culture, continue to monitor 6. per ID: Will need new one placed and will need a separate line for IV Carbapenem if cultures are positive for ESBL Klebsiella 7. Repeat CT scan when drain output<10 to 15 mL/day 2. Hypokalemia, replaced PO and follow up this afternoon 3. 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 ESRD on HD M, W, F  1. HD catheter removed as above 2. Continue following with nephro 5. Seizure disorder continue Tegretol and phenobarbital 6. Diabetes -continue Levemir 8 units daily, continue sliding scale 7. Metabolic bone disease 1. On PhosLo 8. Normocytic Anemia 1. Was given ESA as outpatient 9. Hypertension continue current regimen and volume removal per nephro 10. Chronic nonobstructing right urolithiasis -stable, urology on board 11. Hyperlipidemia -on statin   DVT prophylaxis: SCDs Family Communication: No family at bedside Disposition Plan:   Patient came from:   SNF                                                                                          Anticipated d/c place: TBD  Barriers to d/c: Medical stability,  consultants   Pressure injury documentation   Pressure Injury 04/21/18 Stage II -  Partial thickness loss of dermis presenting as a shallow open ulcer with a red, pink wound bed without slough. redness, possible previous injury (Active)  04/21/18 2200  Location: Coccyx  Location Orientation: Medial  Staging: Stage II -  Partial thickness loss of dermis presenting as a shallow open ulcer with a red, pink wound bed without slough.  Wound Description (Comments): redness, possible previous injury  Present on Admission: Yes     Consultants  ID urology Nephrology IR  Procedures  2/17 HD, on MWF schedule 2/18 CT-guided drain placement x2 2/19 HD  catheter removal  Antibiotics   Anti-infectives (From admission, onward)   Start     Dose/Rate Route Frequency Ordered Stop   03/13/19 2200  meropenem (MERREM) 500 mg in sodium chloride 0.9 % 100 mL IVPB     500 mg 200 mL/hr over 30 Minutes Intravenous Every 24 hours 03/12/19 2210     03/12/19 2215  piperacillin-tazobactam (ZOSYN) IVPB 3.375 g  Status:  Discontinued     3.375 g 12.5 mL/hr over 240 Minutes Intravenous  Once 03/12/19 2205 03/12/19 2208   03/12/19 2215  meropenem (MERREM) 1 g in sodium chloride 0.9 % 100 mL IVPB     1 g 200 mL/hr over 30 Minutes Intravenous  Once 03/12/19 2210 03/13/19 0223        Subjective   Seen at bedside sleeping no acute distress resting comfortably.  Denied any complaints.  No overnight events.  Objective   Vitals:   03/15/19 1456 03/15/19 2021 03/16/19 0452 03/16/19 1330  BP: 119/68 125/76 115/72 126/73  Pulse: 96 84 73 78  Resp: 18 18 18 16   Temp: 97.6 F (36.4 C) 99 F (37.2 C) 97.7 F (36.5 C) 97.7 F (36.5 C)  TempSrc: Oral Oral Oral Oral  SpO2: 100% 100% 100% 100%  Weight:      Height:        Intake/Output Summary (Last 24 hours) at 03/16/2019 1348 Last data filed at 03/16/2019 1124 Gross per 24 hour  Intake 972 ml  Output 306 ml  Net 666 ml   Filed Weights   03/13/19 1640 03/15/19 0935 03/15/19 1330  Weight: 91.7 kg 89.7 kg 88.2 kg    Examination:  Physical Exam Vitals and nursing note reviewed.  Constitutional:      Comments: Chronically ill-appearing Diffusely dry skin  HENT:     Head: Normocephalic and atraumatic.  Eyes:     Conjunctiva/sclera: Conjunctivae normal.  Cardiovascular:     Rate and Rhythm: Normal rate and regular rhythm.  Pulmonary:     Effort: Pulmonary effort is normal.     Breath sounds: Normal breath sounds.  Abdominal:     General: Abdomen is flat.     Palpations: Abdomen is soft.  Musculoskeletal:        General: No swelling or tenderness.     Comments: Low back dressing  C/D/I Decreased drainage  Skin:    Comments: Diffusely dry skin  Neurological:     Mental Status: He is alert. Mental status is at baseline.  Psychiatric:        Mood and Affect: Mood normal.        Behavior: Behavior normal.     Data Reviewed: I have personally reviewed following labs and imaging studies  CBC: Recent Labs  Lab 03/12/19 2331 03/13/19 0423 03/14/19 0154 03/15/19 0241 03/16/19 0135  WBC 8.5 8.5 9.1  6.0 5.3  NEUTROABS 7.0  --   --   --   --   HGB 8.3* 8.2* 8.4* 8.0* 8.0*  HCT 25.9* 25.3* 25.6* 25.1* 25.2*  MCV 94.2 93.0 92.4 95.4 94.0  PLT 228 216 200 198 030   Basic Metabolic Panel: Recent Labs  Lab 03/12/19 2051 03/12/19 2331 03/13/19 0423 03/13/19 1230 03/14/19 0154 03/15/19 0241 03/16/19 0135  NA   < >  --  138 136 134* 136 137  K   < >  --  2.7* 3.2* 2.9* 3.4* 2.7*  CL   < >  --  101 103 98 101 101  CO2   < >  --  21* 21* 24 23 25   GLUCOSE   < >  --  81 102* 278* 275* 188*  BUN   < >  --  41* 43* 16 27* 16  CREATININE   < >  --  5.85* 5.91* 3.21* 4.49* 2.78*  CALCIUM   < >  --  7.5* 7.8* 7.5* 7.7* 7.7*  MG  --  1.8  --   --   --   --   --   PHOS  --   --   --  5.6*  --  4.0 2.0*   < > = values in this interval not displayed.   GFR: Estimated Creatinine Clearance: 37.8 mL/min (A) (by C-G formula based on SCr of 2.78 mg/dL (H)). Liver Function Tests: Recent Labs  Lab 03/12/19 2051 03/13/19 0423 03/13/19 1230 03/15/19 0241 03/16/19 0135  AST 10* 10*  --   --   --   ALT 8 7  --   --   --   ALKPHOS 124 108  --   --   --   BILITOT 0.8 0.4  --   --   --   PROT 6.6 5.6*  --   --   --   ALBUMIN 1.8* 1.5* 1.5* 1.5* 1.6*   Recent Labs  Lab 03/12/19 2051  LIPASE 13   Recent Labs  Lab 03/12/19 2051  AMMONIA 61*   Coagulation Profile: Recent Labs  Lab 03/12/19 2331  INR 1.7*   Cardiac Enzymes: No results for input(s): CKTOTAL, CKMB, CKMBINDEX, TROPONINI in the last 168 hours. BNP (last 3 results) No results for input(s):  PROBNP in the last 8760 hours. HbA1C: No results for input(s): HGBA1C in the last 72 hours. CBG: Recent Labs  Lab 03/15/19 2019 03/16/19 0006 03/16/19 0450 03/16/19 0810 03/16/19 1201  GLUCAP 199* 251* 87 186* 255*   Lipid Profile: No results for input(s): CHOL, HDL, LDLCALC, TRIG, CHOLHDL, LDLDIRECT in the last 72 hours. Thyroid Function Tests: No results for input(s): TSH, T4TOTAL, FREET4, T3FREE, THYROIDAB in the last 72 hours. Anemia Panel: Recent Labs    03/14/19 1444  FERRITIN 949*  TIBC NOT CALCULATED  IRON 19*   Sepsis Labs: Recent Labs  Lab 03/12/19 2051 03/13/19 1139  LATICACIDVEN 0.8 0.8    Recent Results (from the past 240 hour(s))  Blood Culture (routine x 2)     Status: None (Preliminary result)   Collection Time: 03/12/19  9:21 PM   Specimen: BLOOD  Result Value Ref Range Status   Specimen Description BLOOD RIGHT ANTECUBITAL  Final   Special Requests   Final    BOTTLES DRAWN AEROBIC AND ANAEROBIC Blood Culture adequate volume Performed at [facility], [place] [address] [place], [place] [address]    Culture NO GROWTH 4 DAYS  Final   Report Status PENDING  Incomplete  Blood Culture (routine x 2)     Status: None (Preliminary result)   Collection Time: 03/12/19  9:21 PM   Specimen: BLOOD RIGHT HAND  Result Value Ref Range Status   Specimen Description BLOOD RIGHT HAND  Final   Special Requests   Final    BOTTLES DRAWN AEROBIC AND ANAEROBIC Blood Culture adequate volume Performed at [facility], [place] [address] [place], [place] [address]    Culture NO GROWTH 4 DAYS  Final   Report Status PENDING  Incomplete  Urine culture     Status: Abnormal   Collection Time: 03/13/19  7:24 AM   Specimen: In/Out Cath Urine  Result Value Ref Range Status   Specimen Description IN/OUT CATH URINE  Final   Special Requests   Final    NONE Performed at [facility], [place] [address], [place], [place] [address]    Culture MULTIPLE SPECIES  PRESENT, SUGGEST RECOLLECTION (A)  Final   Report Status 03/14/2019 FINAL  Final  Aerobic/Anaerobic Culture (surgical/deep wound)     Status: None (Preliminary result)   Collection Time: 03/14/19 11:01 AM   Specimen: Abscess  Result Value Ref Range Status   Specimen Description ABSCESS  Final   Special Requests NONE  Final   Gram Stain   Final    ABUNDANT WBC PRESENT, PREDOMINANTLY PMN NO ORGANISMS SEEN Performed at [facility], [place] [address], [place], [place] [address]    Culture   Final    MODERATE GRAM NEGATIVE RODS IDENTIFICATION AND SUSCEPTIBILITIES TO FOLLOW CULTURE REINCUBATED FOR BETTER GROWTH    Report Status PENDING  Incomplete         Radiology Studies: IR Removal Tun Cv Cath W/O FL  Result Date: 03/15/2019 INDICATION: End-stage renal disease on hemodialysis. Retroperitoneal abscesses status post drain placement. Bacteremia. Request for removal of tunneled hemodialysis catheter. EXAM: REMOVAL OF TUNNELED HEMODIALYSIS CATHETER MEDICATIONS: 1% lidocaine 2 mL COMPLICATIONS: None immediate. PROCEDURE: Informed written consent was obtained from the patient following an explanation of the procedure, risks, benefits and alternatives to treatment. A time out was performed prior to the initiation of the procedure. Maximal barrier sterile technique was utilized including caps, mask, sterile gowns, sterile gloves, large sterile drape, hand hygiene, and ChloraPrep. 1% lidocaine with epinephrine was injected under sterile conditions along the subcutaneous tunnel. Utilizing a combination of blunt dissection and gentle traction, the catheter was removed intact. Hemostasis was obtained with manual compression. A dressing was placed. The patient tolerated the procedure well without immediate post procedural complication. IMPRESSION: Successful removal of tunneled dialysis catheter. Read by: Gareth Eagle, [name] Electronically Signed   By: Jerilynn Mages.  Shick M.D.   On: 03/15/2019 14:21         Scheduled Meds: . allopurinol  100 mg Oral Daily  . carbamazepine  900 mg Oral BID  . Chlorhexidine Gluconate Cloth  6 each Topical Q0600  . feeding supplement (NEPRO CARB STEADY)  237 mL Oral TID BM  . ferrous sulfate  325 mg Oral Q breakfast  . FLUoxetine  20 mg Oral Daily  . insulin aspart  0-9 Units Subcutaneous Q4H  . insulin detemir  8 Units Subcutaneous Daily  . lactulose  20 g Oral BID  . multivitamin with minerals  1 tablet Oral QHS  . pantoprazole  40 mg Oral Daily  . PHENobarbital  259.2 mg Oral QHS  . potassium phosphate (monobasic)  500 mg Oral BID  WC  . simvastatin  20 mg Oral QHS  . sodium chloride flush  5 mL Intracatheter Q8H  . tamsulosin  0.4 mg Oral QHS   Continuous Infusions: . meropenem (MERREM) IV Stopped (03/15/19 2152)     Time spent: 27mnutes with over 50% of the time coordinating the patient's care    JHarold Hedge DO [facility] Pager [phone] Call night coverage person covering after 7pm

## 2019-03-17 LAB — GLUCOSE, CAPILLARY
Glucose-Capillary: 170 mg/dL — ABNORMAL HIGH (ref 70–99)
Glucose-Capillary: 176 mg/dL — ABNORMAL HIGH (ref 70–99)
Glucose-Capillary: 217 mg/dL — ABNORMAL HIGH (ref 70–99)
Glucose-Capillary: 217 mg/dL — ABNORMAL HIGH (ref 70–99)
Glucose-Capillary: 227 mg/dL — ABNORMAL HIGH (ref 70–99)
Glucose-Capillary: 238 mg/dL — ABNORMAL HIGH (ref 70–99)
Glucose-Capillary: 286 mg/dL — ABNORMAL HIGH (ref 70–99)

## 2019-03-17 LAB — RENAL FUNCTION PANEL
Albumin: 1.5 g/dL — ABNORMAL LOW (ref 3.5–5.0)
Anion gap: 10 (ref 5–15)
BUN: 33 mg/dL — ABNORMAL HIGH (ref 6–20)
CO2: 25 mmol/L (ref 22–32)
Calcium: 7.7 mg/dL — ABNORMAL LOW (ref 8.9–10.3)
Chloride: 102 mmol/L (ref 98–111)
Creatinine, Ser: 4.01 mg/dL — ABNORMAL HIGH (ref 0.61–1.24)
GFR calc Af Amer: 19 mL/min — ABNORMAL LOW (ref >60)
GFR calc non Af Amer: 17 mL/min — ABNORMAL LOW (ref >60)
Glucose, Bld: 250 mg/dL — ABNORMAL HIGH (ref 70–99)
Phosphorus: 3.6 mg/dL (ref 2.5–4.6)
Potassium: 3 mmol/L — ABNORMAL LOW (ref 3.5–5.1)
Sodium: 137 mmol/L (ref 135–145)

## 2019-03-17 LAB — CULTURE, BLOOD (ROUTINE X 2)
Culture: NO GROWTH
Culture: NO GROWTH
Special Requests: ADEQUATE
Special Requests: ADEQUATE

## 2019-03-17 MED ORDER — SODIUM CHLORIDE 0.9 % IV SOLN
500.0000 mg | INTRAVENOUS | Status: DC
  Administered 2019-03-17 – 2019-03-22 (×6): 500 mg via INTRAVENOUS
  Filled 2019-03-17 (×7): qty 0.5

## 2019-03-17 MED ORDER — HYDROMORPHONE HCL 1 MG/ML IJ SOLN
0.5000 mg | Freq: Four times a day (QID) | INTRAMUSCULAR | Status: DC | PRN
  Administered 2019-03-17 – 2019-03-23 (×10): 0.5 mg via INTRAVENOUS
  Filled 2019-03-17 (×10): qty 1

## 2019-03-17 MED ORDER — MAGNESIUM OXIDE 400 (241.3 MG) MG PO TABS
200.0000 mg | ORAL_TABLET | Freq: Two times a day (BID) | ORAL | Status: AC
  Administered 2019-03-17 (×2): 200 mg via ORAL
  Filled 2019-03-17 (×2): qty 1

## 2019-03-17 MED ORDER — POTASSIUM CHLORIDE CRYS ER 20 MEQ PO TBCR
30.0000 meq | EXTENDED_RELEASE_TABLET | Freq: Two times a day (BID) | ORAL | Status: AC
  Administered 2019-03-17 (×2): 30 meq via ORAL
  Filled 2019-03-17 (×2): qty 1

## 2019-03-17 NOTE — Progress Notes (Signed)
      INFECTIOUS DISEASE ATTENDING ADDENDUM:   Date: 03/17/2019  Patient name: Eddie Davies  Medical record number: [id number]  Date of birth: 1970-11-02   He has Klebsiella in abcess that is NOT ESBL   #1 Prior ESBL bacteremia  Would give him 2 weeks of carbapenem therapy from date of HD line removal  (he will need separate line for this if still in the hospital)  #2 Abscess:    After finishing this would switch to cefepime with HD and complete at least another 2 weeks of IV cefepime w HD and be sure his abscess has resolved and drains removed.  He has previously been [facility] but we can follow him here in [place] if the patient would like  Please let us know  Otherwise I will sign off  Please call with further questions.    Alcide Evener 03/17/2019, 3:55 PM

## 2019-03-17 NOTE — Progress Notes (Addendum)
Subjective:  Mild discomfort at flank drain sites    Objective Vital signs in last 24 hours: Vitals:   03/16/19 0452 03/16/19 1330 03/16/19 2008 03/17/19 0449  BP: 115/72 126/73 138/83 132/85  Pulse: 73 78 75 84  Resp: 18 16 17 16   Temp: 97.7 F (36.5 C) 97.7 F (36.5 C) 98.1 F (36.7 C) 98.3 F (36.8 C)  TempSrc: Oral Oral Oral Oral  SpO2: 100% 100% 100% 99%  Weight:      Height:       Weight change:   Physical Exam General:,NAD.chroniclly ill appearing male  ,Skin very dry. Heart:RRR; no murmur Lungs:CTAB Abdomen:soft, non-tender. Two drains in L flank - scantbloody output Extremities:trace LE edema Dialysis Access:TDCout with site dressing dry /clean // + bruit RUE AVF +   Dialysis Orders: MWF at [facility] 4:15hr, 400/800, EDW 94kg, 3K/2.25Ca, TDC, heparin 3000 bolus - Mircera 185mg IV q 2 weeks (2/10)  [facility], EDW 94kg, 3K/2.25Ca, TDC, heparin 3000 bolus - Mircera 1592m IV q 2 weeks (2/10)  Assessment/Plan: 1. 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 Urology consulted - not felt to be candidate for open wash-out,S/pIR drain and Meropenem. IDsaw and recommend removal perm cath = removed 03/15/19 for  Line holiday IR consult to reinsert tomor  for HD tomr . 2. ESRD:Will continue HD on MWF schedule-used 4kbath yest on hd ,  3. Hypokalemia  k 3.0 use k supplement   Use 4k bath on HD  4. Hypo magnesium - po replacement 5. Hypertension/volume:BPstable - no excess volume on exam below edw yest  To 88.2 kg  BUT with pre wt 89.7  wts not ACCURATE  With 1676 cc uf total. . 6. Anemia:Hgb 8.4>8.0>8.0 this am - just given ESA as outpt 03/06/19  - follow for now. Due on 24th Repeat iron studies.low iron Hold IV iron load in setting of Infection  7. Metabolic bone diMHDQQIW:LNLGXQ1.1,HERD.0>2.0 will hold phoslo binder give supplement - and  3.6 this am  8. T2DM- per admit 1. R kidney stone: Per CT - non-obstructing at the moment, asymptomatic  DaErnest HaberPA-C [id number] 6569/21/2021,1:12 PM  LOS: 4 days   Labs: Basic Metabolic Panel: Recent Labs  Lab 03/15/19 0241 03/15/19 0241 03/16/19 0135 03/16/19 1443 03/17/19 0259  NA 136   < > 137 136 137  K 3.4*   < > 2.7* 3.0* 3.0*  CL 101   < > 101 101 102  CO2 23   < > 25 25 25   GLUCOSE 275*   < > 188* 293* 250*  BUN 27*   < > 16 22* 33*  CREATININE 4.49*   < > 2.78* 3.49* 4.01*  CALCIUM 7.7*   < > 7.7* 7.7* 7.7*  PHOS 4.0  --  2.0*  --  3.6   < > = values in this interval not displayed.   Liver Function Tests: Recent Labs  Lab 03/12/19 2051 03/12/19 2051 03/13/19 0423 03/13/19 1230 03/15/19 0241 03/16/19 0135 03/17/19 0259  AST 10*  --  10*  --   --   --   --   ALT 8  --  7  --   --   --   --   ALKPHOS 124  --  108  --   --   --   --   BILITOT 0.8  --  0.4  --   --   --   --  PROT 6.6  --  5.6*  --   --   --   --   ALBUMIN 1.8*   < > 1.5*   < > 1.5* 1.6* 1.5*   < > = values in this interval not displayed.   Recent Labs  Lab 03/12/19 2051  LIPASE 13   Recent Labs  Lab 03/12/19 2051  AMMONIA 61*   CBC: Recent Labs  Lab 03/12/19 2331 03/12/19 2331 03/13/19 0423 03/13/19 0423 03/14/19 0154 03/15/19 0241 03/16/19 0135  WBC 8.5   < > 8.5   < > 9.1 6.0 5.3  NEUTROABS 7.0  --   --   --   --   --   --   HGB 8.3*   < > 8.2*   < > 8.4* 8.0* 8.0*  HCT 25.9*   < > 25.3*   < > 25.6* 25.1* 25.2*  MCV 94.2  --  93.0  --  92.4 95.4 94.0  PLT 228   < > 216   < > 200 198 184   < > = values in this interval not displayed.   Cardiac Enzymes: No results for input(s): CKTOTAL, CKMB, CKMBINDEX, TROPONINI in the last 168 hours. CBG: Recent Labs  Lab 03/16/19 2359 03/17/19 0447 03/17/19 0750 03/17/19 1129 03/17/19 1159  GLUCAP 286* 217* 176* 238* 227*    Studies/Results: IR Removal Tun Cv Cath W/O FL  Result  Date: 03/15/2019 INDICATION: End-stage renal disease on hemodialysis. Retroperitoneal abscesses status post drain placement. Bacteremia. Request for removal of tunneled hemodialysis catheter. EXAM: REMOVAL OF TUNNELED HEMODIALYSIS CATHETER MEDICATIONS: 1% lidocaine 2 mL COMPLICATIONS: None immediate. PROCEDURE: Informed written consent was obtained from the patient following an explanation of the procedure, risks, benefits and alternatives to treatment. A time out was performed prior to the initiation of the procedure. Maximal barrier sterile technique was utilized including caps, mask, sterile gowns, sterile gloves, large sterile drape, hand hygiene, and ChloraPrep. 1% lidocaine with epinephrine was injected under sterile conditions along the subcutaneous tunnel. Utilizing a combination of blunt dissection and gentle traction, the catheter was removed intact. Hemostasis was obtained with manual compression. A dressing was placed. The patient tolerated the procedure well without immediate post procedural complication. IMPRESSION: Successful removal of tunneled dialysis catheter. Read by: Gareth Eagle, [name] Electronically Signed   By: Jerilynn Mages.  Shick M.D.   On: 03/15/2019 14:21   Medications: . meropenem (MERREM) IV 500 mg (03/16/19 2236)   . allopurinol  100 mg Oral Daily  . carbamazepine  900 mg Oral BID  . Chlorhexidine Gluconate Cloth  6 each Topical Q0600  . feeding supplement (NEPRO CARB STEADY)  237 mL Oral TID BM  . ferrous sulfate  325 mg Oral Q breakfast  . FLUoxetine  20 mg Oral Daily  . insulin aspart  0-9 Units Subcutaneous Q4H  . insulin detemir  8 Units Subcutaneous Daily  . lactulose  20 g Oral BID  . multivitamin with minerals  1 tablet Oral QHS  . pantoprazole  40 mg Oral Daily  . PHENobarbital  259.2 mg Oral QHS  . phosphorus  500 mg Oral BID WC  . simvastatin  20 mg Oral QHS  . sodium chloride flush  5 mL Intracatheter Q8H  . tamsulosin  0.4 mg Oral QHS

## 2019-03-17 NOTE — Progress Notes (Signed)
Referring Physician(s): Gardner,J/Upton,E  Supervising Physician: Corrie Mckusick  Patient Status:  [facility] - In-pt  Chief Complaint: Retroperitoneal fluid collections S/P drains x 2 by Dr.Henn 03/14/19 Back pain  Subjective: Patient currently without new c/o; still has some left flank discomfort secondary to drains   Allergies: Fish oil  Medications: Prior to Admission medications   Medication Sig Start Date End Date Taking? Authorizing Provider  acetaminophen (TYLENOL) 325 MG tablet Take 650 mg by mouth every 8 (eight) hours as needed for fever (pain).   Yes [provider]  allopurinol (ZYLOPRIM) 100 MG tablet Take 100 mg by mouth daily.  08/01/18  Yes [provider]  calcium acetate (PHOSLO) 667 MG capsule Take 2,001 mg by mouth 3 (three) times daily with meals.    Yes [provider]  carbamazepine (CARBATROL) 100 MG 12 hr capsule Take 100 mg by mouth 2 (two) times daily. 02/23/19  Yes [provider]  carbamazepine (TEGRETOL XR) 400 MG 12 hr tablet Take 800 mg by mouth 2 (two) times daily.   Yes [provider]  cloNIDine (CATAPRES) 0.1 MG tablet Take 0.1 mg by mouth 2 (two) times daily as needed (SBP ABOVE 170).  07/25/18  Yes [provider]  DULoxetine (CYMBALTA) 20 MG capsule Take 20 mg by mouth daily.   Yes [provider]  ferrous sulfate 325 (65 FE) MG tablet Take 325 mg by mouth daily with breakfast.   Yes [provider]  guaiFENesin (MUCINEX) 600 MG 12 hr tablet Take 600 mg by mouth every 12 (twelve) hours as needed for cough (congestion).   Yes [provider]  insulin aspart (NOVOLOG) 100 UNIT/ML injection Inject 0-15 Units into the skin 4 (four) times daily -  with meals and at bedtime. 70-120=0 units, 121-150=2 units, 151-200=3 units, 201-250=5 units, 251-300=8 units, 301-350=11 units, 351-400=15 units, >400 call MD   Yes [provider]  Insulin Glargine (BASAGLAR KWIKPEN) 100  UNIT/ML SOPN Inject 0.28 mLs (28 Units total) into the skin at bedtime. Patient taking differently: Inject 20 Units into the skin at bedtime.  12/05/18 03/13/19 Yes [place] Eric J, DO  Ipratropium-Albuterol (COMBIVENT) 20-100 MCG/ACT AERS respimat Inhale 1 puff into the lungs every 6 (six) hours as needed for wheezing.   Yes [provider]  lactulose (CHRONULAC) 10 GM/15ML solution Take 15 mLs (10 g total) by mouth 2 (two) times daily. Patient taking differently: Take 30 g by mouth 2 (two) times daily.  05/31/18  Yes [name], [name] K, MD  lidocaine-prilocaine (EMLA) cream Apply 1 application topically every Monday, Wednesday, and Friday.   Yes [provider]  Multiple Vitamin (MULTIVITAMIN WITH MINERALS) TABS tablet Take 1 tablet by mouth at bedtime.   Yes [provider]  omeprazole (PRILOSEC) 20 MG capsule Take 20 mg by mouth daily.  07/04/18  Yes [provider]  ondansetron (ZOFRAN) 4 MG tablet Take 4 mg by mouth every 8 (eight) hours as needed for nausea or vomiting.  08/03/18  Yes [provider]  PHENobarbital (LUMINAL) 64.8 MG tablet Take 4 tablets (259.2 mg total) by mouth at bedtime. 12/05/18 03/13/19 Yes [place] Eric J, DO  PROTEIN PO Take 30 mLs by mouth in the morning and at bedtime.   Yes [provider]  simvastatin (ZOCOR) 20 MG tablet Take 20 mg by mouth at bedtime.    Yes [provider]  sodium bicarbonate 650 MG tablet Take 650 mg by mouth 3 (three) times daily.  Yes [provider]  tamsulosin (FLOMAX) 0.4 MG CAPS capsule Take 0.4 mg by mouth at bedtime.   Yes [provider]  Darbepoetin Alfa (ARANESP) 200 MCG/0.4ML SOSY injection Inject 0.4 mLs (200 mcg total) into the vein every Thursday with hemodialysis. 06/28/18   Amin, Jeanella Flattery, MD  HYDROcodone-acetaminophen (NORCO) 5-325 MG tablet Take 1 tablet by mouth every 6 (six) hours as needed for moderate pain. 12/05/18   [place] Donnamarie Poag, DO      Vital Signs: BP 132/85 (BP Location: Left Arm)   Pulse 84   Temp 98.3 F (36.8 C) (Oral)   Resp 16   Ht 6' 2"  (1.88 m)   Wt 194 lb 7.1 oz (88.2 kg)   SpO2 99%   BMI 24.97 kg/m   Physical Exam awake/alert; chest- sl dim BS bases; heart- RRR; abd- soft,+BS,NT; left RP drains intact, insertion sites ok, mildly tender, OP ranging from 75-140 cc blood-tinged fluid  Imaging: IR Removal Tun Cv Cath W/O FL  Result Date: 03/15/2019 INDICATION: End-stage renal disease on hemodialysis. Retroperitoneal abscesses status post drain placement. Bacteremia. Request for removal of tunneled hemodialysis catheter. EXAM: REMOVAL OF TUNNELED HEMODIALYSIS CATHETER MEDICATIONS: 1% lidocaine 2 mL COMPLICATIONS: None immediate. PROCEDURE: Informed written consent was obtained from the patient following an explanation of the procedure, risks, benefits and alternatives to treatment. A time out was performed prior to the initiation of the procedure. Maximal barrier sterile technique was utilized including caps, mask, sterile gowns, sterile gloves, large sterile drape, hand hygiene, and ChloraPrep. 1% lidocaine with epinephrine was injected under sterile conditions along the subcutaneous tunnel. Utilizing a combination of blunt dissection and gentle traction, the catheter was removed intact. Hemostasis was obtained with manual compression. A dressing was placed. The patient tolerated the procedure well without immediate post procedural complication. IMPRESSION: Successful removal of tunneled dialysis catheter. Read by: Gareth Eagle, [name] Electronically Signed   By: Jerilynn Mages.  Shick M.D.   On: 03/15/2019 14:21   CT IMAGE GUIDED FLUID DRAIN BY CATHETER  Result Date: 03/14/2019 INDICATION: 49 year old with history of nephrectomy and recurrent left retroperitoneal abscess. EXAM: CT-GUIDED PLACEMENT OF LEFT RETROPERITONEAL ABSCESS DRAIN X2 MEDICATIONS: The patient is currently admitted to the hospital and receiving intravenous  antibiotics. ANESTHESIA/SEDATION: Fentanyl 50 mcg IV; Versed 1.0 mg IV Moderate Sedation Time:  45 minutes The patient was continuously monitored during the procedure by the interventional radiology nurse under my direct supervision. COMPLICATIONS: None immediate. PROCEDURE: Informed written consent was obtained from the patient after a thorough discussion of the procedural risks, benefits and alternatives. All questions were addressed. Maximal Sterile Barrier Technique was utilized including caps, mask, sterile gowns, sterile gloves, sterile drape, hand hygiene and skin antiseptic. A timeout was performed prior to the initiation of the procedure. Patient was placed prone. CT images through the abdomen were obtained. Large air-fluid collection in the left retroperitoneum. The left flank was prepped with chlorhexidine and sterile field was created. Skin and soft tissues were anesthetized with 1% lidocaine. Using CT guidance, 18 gauge trocar needle was directed into the large collection. Yellow purulent fluid was aspirated. Superstiff Amplatz wire was placed through the needle and the tract was dilated to accommodate a 14 [place] biliary type drain. Large amount of yellow purulent fluid was removed. Follow up CT images were obtained. CT images demonstrated residual air-fluid collection posterior to the spleen and upper portion of the collection. Skin was anesthetized with 1% lidocaine and a new incision was made. New 18 gauge trocar needle was  directed into the upper portion of the residual collection with CT guidance. Large amount of air was aspirated. Superstiff Amplatz wire was advanced into the collection and the tract was dilated to accommodate a 12 [place] multipurpose drain. Large amount of gas and bloody purulent fluid was aspirated. Follow up CT images were obtained. Both catheters sutured to skin and attached to a suction bulb. StatLock was placed. FINDINGS: Large complex air-fluid collection in the left  retroperitoneum with some extension into the left lateral subcutaneous tissues. A 14 French biliary type drain was placed in the midportion of the collection. 430 mL of yellow purulent fluid was removed from this drain. Follow up CT images demonstrated residual air-fluid collection along the superior aspect of the collection posterior to the spleen. Second drain was placed in the superior aspect of the collection and 100 mL purulent fluid and a large amount of gas was removed. Final CT images demonstrate marked decompression of the abscess cavities. A small amount of residual gas and fluid. Again noted is high-density material in the right renal collecting system compatible with nephrolithiasis. There is a focal stone at the right UPJ that measures roughly 1.3 cm. IMPRESSION: CT-guided placement of two percutaneous drains within the large left retroperitoneal abscess. Total of 530 mL of purulent fluid was removed from the abscess. Fluid was sent for culture. Patient will need follow-up CT images when the drainage decreases to look for any residual collections. Electronically Signed   By: Markus Daft M.D.   On: 03/14/2019 14:10   CT IMAGE GUIDED DRAINAGE BY PERCUTANEOUS CATHETER  Result Date: 03/14/2019 INDICATION: 49 year old with history of nephrectomy and recurrent left retroperitoneal abscess. EXAM: CT-GUIDED PLACEMENT OF LEFT RETROPERITONEAL ABSCESS DRAIN X2 MEDICATIONS: The patient is currently admitted to the hospital and receiving intravenous antibiotics. ANESTHESIA/SEDATION: Fentanyl 50 mcg IV; Versed 1.0 mg IV Moderate Sedation Time:  45 minutes The patient was continuously monitored during the procedure by the interventional radiology nurse under my direct supervision. COMPLICATIONS: None immediate. PROCEDURE: Informed written consent was obtained from the patient after a thorough discussion of the procedural risks, benefits and alternatives. All questions were addressed. Maximal Sterile Barrier  Technique was utilized including caps, mask, sterile gowns, sterile gloves, sterile drape, hand hygiene and skin antiseptic. A timeout was performed prior to the initiation of the procedure. Patient was placed prone. CT images through the abdomen were obtained. Large air-fluid collection in the left retroperitoneum. The left flank was prepped with chlorhexidine and sterile field was created. Skin and soft tissues were anesthetized with 1% lidocaine. Using CT guidance, 18 gauge trocar needle was directed into the large collection. Yellow purulent fluid was aspirated. Superstiff Amplatz wire was placed through the needle and the tract was dilated to accommodate a 14 [place] biliary type drain. Large amount of yellow purulent fluid was removed. Follow up CT images were obtained. CT images demonstrated residual air-fluid collection posterior to the spleen and upper portion of the collection. Skin was anesthetized with 1% lidocaine and a new incision was made. New 18 gauge trocar needle was directed into the upper portion of the residual collection with CT guidance. Large amount of air was aspirated. Superstiff Amplatz wire was advanced into the collection and the tract was dilated to accommodate a 12 [place] multipurpose drain. Large amount of gas and bloody purulent fluid was aspirated. Follow up CT images were obtained. Both catheters sutured to skin and attached to a suction bulb. StatLock was placed. FINDINGS: Large complex air-fluid collection in the left  retroperitoneum with some extension into the left lateral subcutaneous tissues. A 14 French biliary type drain was placed in the midportion of the collection. 430 mL of yellow purulent fluid was removed from this drain. Follow up CT images demonstrated residual air-fluid collection along the superior aspect of the collection posterior to the spleen. Second drain was placed in the superior aspect of the collection and 100 mL purulent fluid and a large amount of gas  was removed. Final CT images demonstrate marked decompression of the abscess cavities. A small amount of residual gas and fluid. Again noted is high-density material in the right renal collecting system compatible with nephrolithiasis. There is a focal stone at the right UPJ that measures roughly 1.3 cm. IMPRESSION: CT-guided placement of two percutaneous drains within the large left retroperitoneal abscess. Total of 530 mL of purulent fluid was removed from the abscess. Fluid was sent for culture. Patient will need follow-up CT images when the drainage decreases to look for any residual collections. Electronically Signed   By: Markus Daft M.D.   On: 03/14/2019 14:10    Labs:  CBC: Recent Labs    03/13/19 0423 03/14/19 0154 03/15/19 0241 03/16/19 0135  WBC 8.5 9.1 6.0 5.3  HGB 8.2* 8.4* 8.0* 8.0*  HCT 25.3* 25.6* 25.1* 25.2*  PLT 216 200 198 184    COAGS: Recent Labs    05/19/18 1339 05/19/18 1339 05/21/18 0419 05/29/18 0500 06/18/18 1319 03/12/19 2331  INR 1.6*   < > 1.7* 1.3* 1.9* 1.7*  APTT 47*  --   --   --   --  39*   < > = values in this interval not displayed.    BMP: Recent Labs    03/15/19 0241 03/16/19 0135 03/16/19 1443 03/17/19 0259  NA 136 137 136 137  K 3.4* 2.7* 3.0* 3.0*  CL 101 101 101 102  CO2 23 25 25 25   GLUCOSE 275* 188* 293* 250*  BUN 27* 16 22* 33*  CALCIUM 7.7* 7.7* 7.7* 7.7*  CREATININE 4.49* 2.78* 3.49* 4.01*  GFRNONAA 14* 26* 20* 17*  GFRAA 17* 30* 23* 19*    LIVER FUNCTION TESTS: Recent Labs    10/23/18 1043 10/23/18 1043 11/27/18 2019 12/03/18 0947 03/12/19 2051 03/12/19 2051 03/13/19 0423 03/13/19 0423 03/13/19 1230 03/15/19 0241 03/16/19 0135 03/17/19 0259  BILITOT 0.4  --  0.7  --  0.8  --  0.4  --   --   --   --   --   AST 22  --  17  --  10*  --  10*  --   --   --   --   --   ALT 20  --  14  --  8  --  7  --   --   --   --   --   ALKPHOS 145*  --  125  --  124  --  108  --   --   --   --   --   PROT 7.3  --  6.5   --  6.6  --  5.6*  --   --   --   --   --   ALBUMIN 3.1*   < > 2.7*   < > 1.8*   < > 1.5*   < > 1.5* 1.5* 1.6* 1.5*   < > = values in this interval not displayed.    Assessment and Plan: Pt s/p left RP fluid coll/abscess drains  x2 2/18; s/p rt IJ HD cath removal 2/19 secondary to bacteremia; afebrile; creat 4.01, K 3.0- replace;  Last WBC nl; hgb 8, drain fluid cx with klebsiella- pt on IV merrem; cont current tx, monitor labs closely, drain irrigation; obtain f/u CT once drain OP < 10-15 cc/ day for 2-3 days;  Request received for replacement of TDC following recent line holiday; plans d/w pt with his understanding and consent. Tent planned for 2/22. Check am labs.  Electronically Signed: D. Rowe Robert, [name] 03/17/2019, 3:29 PM   I spent a total of 20 minutes at the the patient's bedside AND on the patient's hospital floor or unit, greater than 50% of which was counseling/coordinating care for retroperitoneal abscess drains/dialysis catheter placement    Patient ID: Eddie Davies, male   DOB: 12-Dec-1970, 49 y.o.   MRN: [id number]

## 2019-03-17 NOTE — Progress Notes (Signed)
PROGRESS NOTE    Eddie Davies    Code Status: Full Code  FXJ:[id number] DOB: 1970/03/19 DOA: 03/12/2019 LOS: 4 days  PCP: [facility] CC:  Chief Complaint  Patient presents with  . Emesis       Hospital Summary  Eddie Davies is a 49 y.o. male with a history of ESRD on HD, ESBL who was admitted early this morning by Dr. Alcario Drought for retroperitoneal abscess from SNF.  He was recently admitted for left-sided pyelonephritis and sepsis status post left-sided radical nephrectomy and right double-J stent placement at OSH who presented with N/V/fever.  Has since been evaluated by nephrology and patient underwent HD today.   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  2/18: Status post CT-guided drain placement x2.  ID consulted. 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 Left retroperitoneal Klebsiella pneumonia abscess with history of ESBL  Klebsiella bacteremia  1. 2/18: status post CT-guided drain placement x2 via IR 2. 2/19: Tunneled HD catheter removed via IR 3. Currently on meropenem 4. ID on board, appreciate further recommendations 5. Repeat CT scan when drain output<10 to 15 mL/day for 2-3 days 6. Still having pain, not improved with Tylenol. Will add renally dosed dilaudid due to ESRD 2. Hypokalemia, per nephro 3. 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 ESRD on HD M, W, F  1. HD catheter removed as above, s/p 48 hour line holiday 2. TDC reinserted with IR in AM and HD tomorrow 3. Continue following with nephro 5. Seizure disorder continue Tegretol and phenobarbital 6. Diabetes -continue Levemir 8 units daily, continue sliding scale 7. Metabolic bone diseaseOn PhosLo 8. Normocytic Anemia Was given ESA as outpatient 9. Hypertension continue current regimen and volume removal per nephro 10. Chronic nonobstructing right urolithiasis -stable, urology on board 11. Hyperlipidemia -on statin   DVT prophylaxis: Subcu Heparin Family Communication: No family at bedside Disposition Plan:   Patient came from:   SNF                                                                                          Anticipated d/c place: TBD  Barriers to d/c: Medical stability, consultants   Pressure injury documentation  Pressure Injury 04/21/18 Stage II -  Partial thickness loss of dermis presenting as a shallow open ulcer with a red, pink wound bed without slough. redness, possible previous injury (Active)  04/21/18 2200  Location: Coccyx  Location Orientation: Medial  Staging: Stage II -  Partial thickness loss of dermis presenting as a shallow open ulcer with a red, pink wound bed without slough.  Wound Description (Comments): redness, possible previous injury  Present on Admission: Yes     Consultants  ID urology Nephrology IR  Procedures  2/17 HD, on MWF schedule 2/18 CT-guided drain placement x2 2/19 HD catheter removal   Antibiotics   Anti-infectives (From admission, onward)   Start     Dose/Rate Route Frequency Ordered Stop   03/13/19 2200  meropenem (MERREM) 500 mg in sodium chloride 0.9 % 100 mL IVPB     500 mg 200 mL/hr over 30 Minutes Intravenous Every 24 hours 03/12/19 2210     03/12/19 2215  piperacillin-tazobactam (ZOSYN) IVPB 3.375 g  Status:  Discontinued     3.375 g 12.5 mL/hr over 240 Minutes Intravenous  Once 03/12/19 2205 03/12/19 2208   03/12/19 2215  meropenem (MERREM) 1 g in sodium chloride 0.9 % 100 mL IVPB     1 g 200 mL/hr over 30 Minutes Intravenous  Once 03/12/19 2210 03/13/19 0223        Subjective   Seen at bedside sleeping no acute distress resting comfortably.  Denied any complaints.  No overnight events.  Objective   Vitals:   03/16/19 0452 03/16/19 1330 03/16/19 2008 03/17/19 0449  BP: 115/72 126/73 138/83 132/85  Pulse: 73 78 75 84  Resp: 18 16 17 16   Temp: 97.7 F (36.5 C) 97.7 F (36.5 C) 98.1 F (36.7 C) 98.3 F (36.8 C)  TempSrc: Oral Oral Oral Oral  SpO2: 100% 100% 100% 99%  Weight:      Height:        Intake/Output Summary (Last 24 hours) at 03/17/2019 1547 Last data filed at 03/17/2019 0900 Gross per 24 hour  Intake 380 ml  Output 90 ml  Net 290 ml   Filed Weights   03/13/19 1640 03/15/19 0935 03/15/19 1330  Weight: 91.7 kg 89.7 kg 88.2 kg    Examination:  Physical Exam Vitals and nursing note reviewed.  Constitutional:      Appearance: Normal appearance.  HENT:     Head: Normocephalic and atraumatic.  Eyes:     Conjunctiva/sclera: Conjunctivae normal.  Cardiovascular:     Rate and Rhythm: Normal rate and regular rhythm.  Pulmonary:     Effort: Pulmonary effort is normal.     Breath sounds: Normal breath sounds.  Abdominal:     General: Abdomen is flat.     Palpations: Abdomen is soft.  Musculoskeletal:        General: No swelling.  Skin:    General: Skin is dry.     Coloration: Skin is not jaundiced.  Neurological:      Mental Status: He is alert. Mental status is at baseline.  Psychiatric:        Mood and Affect: Mood normal.        Behavior: Behavior normal.     Data Reviewed: I have personally reviewed following labs and imaging studies  CBC: Recent Labs  Lab 03/12/19 2331 03/13/19 0423 03/14/19 0154 03/15/19 0241 03/16/19 0135  WBC 8.5 8.5 9.1 6.0 5.3  NEUTROABS 7.0  --   --   --   --  HGB 8.3* 8.2* 8.4* 8.0* 8.0*  HCT 25.9* 25.3* 25.6* 25.1* 25.2*  MCV 94.2 93.0 92.4 95.4 94.0  PLT 228 216 200 198 662   Basic Metabolic Panel: Recent Labs  Lab 03/12/19 2331 03/13/19 0423 03/13/19 1230 03/13/19 1230 03/14/19 0154 03/15/19 0241 03/16/19 0135 03/16/19 1443 03/17/19 0259  NA  --    < > 136   < > 134* 136 137 136 137  K  --    < > 3.2*   < > 2.9* 3.4* 2.7* 3.0* 3.0*  CL  --    < > 103   < > 98 101 101 101 102  CO2  --    < > 21*   < > 24 23 25 25 25   GLUCOSE  --    < > 102*   < > 278* 275* 188* 293* 250*  BUN  --    < > 43*   < > 16 27* 16 22* 33*  CREATININE  --    < > 5.91*   < > 3.21* 4.49* 2.78* 3.49* 4.01*  CALCIUM  --    < > 7.8*   < > 7.5* 7.7* 7.7* 7.7* 7.7*  MG 1.8  --   --   --   --   --   --   --   --   PHOS  --   --  5.6*  --   --  4.0 2.0*  --  3.6   < > = values in this interval not displayed.   GFR: Estimated Creatinine Clearance: 26.2 mL/min (A) (by C-G formula based on SCr of 4.01 mg/dL (H)). Liver Function Tests: Recent Labs  Lab 03/12/19 2051 03/12/19 2051 03/13/19 0423 03/13/19 1230 03/15/19 0241 03/16/19 0135 03/17/19 0259  AST 10*  --  10*  --   --   --   --   ALT 8  --  7  --   --   --   --   ALKPHOS 124  --  108  --   --   --   --   BILITOT 0.8  --  0.4  --   --   --   --   PROT 6.6  --  5.6*  --   --   --   --   ALBUMIN 1.8*   < > 1.5* 1.5* 1.5* 1.6* 1.5*   < > = values in this interval not displayed.   Recent Labs  Lab 03/12/19 2051  LIPASE 13   Recent Labs  Lab 03/12/19 2051  AMMONIA 61*   Coagulation Profile: Recent Labs  Lab  03/12/19 2331  INR 1.7*   Cardiac Enzymes: No results for input(s): CKTOTAL, CKMB, CKMBINDEX, TROPONINI in the last 168 hours. BNP (last 3 results) No results for input(s): PROBNP in the last 8760 hours. HbA1C: No results for input(s): HGBA1C in the last 72 hours. CBG: Recent Labs  Lab 03/16/19 2359 03/17/19 0447 03/17/19 0750 03/17/19 1129 03/17/19 1159  GLUCAP 286* 217* 176* 238* 227*   Lipid Profile: No results for input(s): CHOL, HDL, LDLCALC, TRIG, CHOLHDL, LDLDIRECT in the last 72 hours. Thyroid Function Tests: No results for input(s): TSH, T4TOTAL, FREET4, T3FREE, THYROIDAB in the last 72 hours. Anemia Panel: No results for input(s): VITAMINB12, FOLATE, FERRITIN, TIBC, IRON, RETICCTPCT in the last 72 hours. Sepsis Labs: Recent Labs  Lab 03/12/19 2051 03/13/19 1139  LATICACIDVEN 0.8 0.8    Recent Results (from the past 240 hour(s))  Blood Culture (routine x 2)     Status: None   Collection Time: 03/12/19  9:21 PM   Specimen: BLOOD  Result Value Ref Range Status   Specimen Description BLOOD RIGHT ANTECUBITAL  Final   Special Requests   Final    BOTTLES DRAWN AEROBIC AND ANAEROBIC Blood Culture adequate volume   Culture   Final    NO GROWTH 5 DAYS Performed at [facility], [address] [place], [place] [address]    Report Status 03/17/2019 FINAL  Final  Blood Culture (routine x 2)     Status: None   Collection Time: 03/12/19  9:21 PM   Specimen: BLOOD RIGHT HAND  Result Value Ref Range Status   Specimen Description BLOOD RIGHT HAND  Final   Special Requests   Final    BOTTLES DRAWN AEROBIC AND ANAEROBIC Blood Culture adequate volume   Culture   Final    NO GROWTH 5 DAYS Performed at [facility], [place] [address], [place], [place] [address]    Report Status 03/17/2019 FINAL  Final  Urine culture     Status: Abnormal   Collection Time: 03/13/19  7:24 AM   Specimen: In/Out Cath Urine  Result Value Ref Range Status   Specimen Description  IN/OUT CATH URINE  Final   Special Requests   Final    NONE Performed at [facility], [place] [address], [place], [place] [address]    Culture MULTIPLE SPECIES PRESENT, SUGGEST RECOLLECTION (A)  Final   Report Status 03/14/2019 FINAL  Final  Aerobic/Anaerobic Culture (surgical/deep wound)     Status: None (Preliminary result)   Collection Time: 03/14/19 11:01 AM   Specimen: Abscess  Result Value Ref Range Status   Specimen Description ABSCESS  Final   Special Requests NONE  Final   Gram Stain   Final    ABUNDANT WBC PRESENT, PREDOMINANTLY PMN NO ORGANISMS SEEN Performed at [facility], [place] [address] [place], [place] [address]    Culture   Final    MODERATE KLEBSIELLA PNEUMONIAE NO ANAEROBES ISOLATED; CULTURE IN PROGRESS FOR 5 DAYS    Report Status PENDING  Incomplete   Organism ID, Bacteria KLEBSIELLA PNEUMONIAE  Final      Susceptibility   Klebsiella pneumoniae - MIC*    AMPICILLIN >=32 RESISTANT Resistant     CEFAZOLIN 16 SENSITIVE Sensitive     CEFEPIME <=0.12 SENSITIVE Sensitive     CEFTAZIDIME <=1 SENSITIVE Sensitive     CEFTRIAXONE <=0.25 SENSITIVE Sensitive     CIPROFLOXACIN 1 SENSITIVE Sensitive     GENTAMICIN 8 INTERMEDIATE Intermediate     IMIPENEM 1 SENSITIVE Sensitive     TRIMETH/SULFA >=320 RESISTANT Resistant     AMPICILLIN/SULBACTAM >=32 RESISTANT Resistant     PIP/TAZO 64 INTERMEDIATE Intermediate     * MODERATE KLEBSIELLA PNEUMONIAE         Radiology Studies: No results found.      Scheduled Meds: . allopurinol  100 mg Oral Daily  . carbamazepine  900 mg Oral BID  . Chlorhexidine Gluconate Cloth  6 each Topical Q0600  . feeding supplement (NEPRO CARB STEADY)  237 mL Oral TID BM  . ferrous sulfate  325 mg Oral Q breakfast  . FLUoxetine  20 mg Oral Daily  . insulin aspart  0-9 Units Subcutaneous Q4H  . insulin detemir  8 Units Subcutaneous Daily  . lactulose  20 g Oral BID  . magnesium oxide  200 mg  Oral BID  . multivitamin  with minerals  1 tablet Oral QHS  . pantoprazole  40 mg Oral Daily  . PHENobarbital  259.2 mg Oral QHS  . phosphorus  500 mg Oral BID WC  . potassium chloride  30 mEq Oral BID  . simvastatin  20 mg Oral QHS  . sodium chloride flush  5 mL Intracatheter Q8H  . tamsulosin  0.4 mg Oral QHS   Continuous Infusions: . meropenem (MERREM) IV 500 mg (03/16/19 2236)     Time spent: 15 minutes with over 50% of the time coordinating the patient's care    Harold Hedge, DO [facility] Pager [phone]  Call night coverage person covering after 7pm

## 2019-03-18 ENCOUNTER — Inpatient Hospital Stay (HOSPITAL_COMMUNITY): Payer: Medicare Other

## 2019-03-18 HISTORY — PX: IR US GUIDE VASC ACCESS RIGHT: IMG2390

## 2019-03-18 HISTORY — PX: IR FLUORO GUIDE CV LINE RIGHT: IMG2283

## 2019-03-18 LAB — CBC WITH DIFFERENTIAL/PLATELET
Abs Immature Granulocytes: 0.03 10*3/uL (ref 0.00–0.07)
Basophils Absolute: 0 10*3/uL (ref 0.0–0.1)
Basophils Relative: 1 %
Eosinophils Absolute: 0.1 10*3/uL (ref 0.0–0.5)
Eosinophils Relative: 2 %
HCT: 25.2 % — ABNORMAL LOW (ref 39.0–52.0)
Hemoglobin: 8 g/dL — ABNORMAL LOW (ref 13.0–17.0)
Immature Granulocytes: 1 %
Lymphocytes Relative: 28 %
Lymphs Abs: 1 10*3/uL (ref 0.7–4.0)
MCH: 30.4 pg (ref 26.0–34.0)
MCHC: 31.7 g/dL (ref 30.0–36.0)
MCV: 95.8 fL (ref 80.0–100.0)
Monocytes Absolute: 0.2 10*3/uL (ref 0.1–1.0)
Monocytes Relative: 6 %
Neutro Abs: 2.3 10*3/uL (ref 1.7–7.7)
Neutrophils Relative %: 62 %
Platelets: 133 10*3/uL — ABNORMAL LOW (ref 150–400)
RBC: 2.63 MIL/uL — ABNORMAL LOW (ref 4.22–5.81)
RDW: 16.2 % — ABNORMAL HIGH (ref 11.5–15.5)
WBC: 3.7 10*3/uL — ABNORMAL LOW (ref 4.0–10.5)
nRBC: 0 % (ref 0.0–0.2)

## 2019-03-18 LAB — BASIC METABOLIC PANEL
Anion gap: 13 (ref 5–15)
BUN: 47 mg/dL — ABNORMAL HIGH (ref 6–20)
CO2: 21 mmol/L — ABNORMAL LOW (ref 22–32)
Calcium: 8 mg/dL — ABNORMAL LOW (ref 8.9–10.3)
Chloride: 103 mmol/L (ref 98–111)
Creatinine, Ser: 4.68 mg/dL — ABNORMAL HIGH (ref 0.61–1.24)
GFR calc Af Amer: 16 mL/min — ABNORMAL LOW (ref >60)
GFR calc non Af Amer: 14 mL/min — ABNORMAL LOW (ref >60)
Glucose, Bld: 163 mg/dL — ABNORMAL HIGH (ref 70–99)
Potassium: 3.8 mmol/L (ref 3.5–5.1)
Sodium: 137 mmol/L (ref 135–145)

## 2019-03-18 LAB — GLUCOSE, CAPILLARY
Glucose-Capillary: 136 mg/dL — ABNORMAL HIGH (ref 70–99)
Glucose-Capillary: 159 mg/dL — ABNORMAL HIGH (ref 70–99)
Glucose-Capillary: 185 mg/dL — ABNORMAL HIGH (ref 70–99)
Glucose-Capillary: 190 mg/dL — ABNORMAL HIGH (ref 70–99)
Glucose-Capillary: 224 mg/dL — ABNORMAL HIGH (ref 70–99)
Glucose-Capillary: 272 mg/dL — ABNORMAL HIGH (ref 70–99)
Glucose-Capillary: 281 mg/dL — ABNORMAL HIGH (ref 70–99)

## 2019-03-18 LAB — PROTIME-INR
INR: 1.2 (ref 0.8–1.2)
Prothrombin Time: 15.2 seconds (ref 11.4–15.2)

## 2019-03-18 MED ORDER — GELATIN ABSORBABLE 12-7 MM EX MISC
CUTANEOUS | Status: AC
  Filled 2019-03-18: qty 1

## 2019-03-18 MED ORDER — FENTANYL CITRATE (PF) 100 MCG/2ML IJ SOLN
INTRAMUSCULAR | Status: AC | PRN
  Administered 2019-03-18: 12.5 ug via INTRAVENOUS

## 2019-03-18 MED ORDER — LIDOCAINE HCL 1 % IJ SOLN
INTRAMUSCULAR | Status: AC
  Filled 2019-03-18: qty 20

## 2019-03-18 MED ORDER — CEFAZOLIN SODIUM-DEXTROSE 2-4 GM/100ML-% IV SOLN: 2.0000 g | Freq: Once | INTRAVENOUS | Status: DC

## 2019-03-18 MED ORDER — CEFAZOLIN SODIUM-DEXTROSE 2-4 GM/100ML-% IV SOLN
INTRAVENOUS | Status: AC
  Administered 2019-03-18: 2 g via INTRAVENOUS
  Filled 2019-03-18: qty 100

## 2019-03-18 MED ORDER — HEPARIN SODIUM (PORCINE) 1000 UNIT/ML IJ SOLN
INTRAMUSCULAR | Status: AC
  Filled 2019-03-18: qty 1

## 2019-03-18 MED ORDER — FENTANYL CITRATE (PF) 100 MCG/2ML IJ SOLN
INTRAMUSCULAR | Status: AC
  Filled 2019-03-18: qty 2

## 2019-03-18 MED ORDER — HEPARIN SODIUM (PORCINE) 1000 UNIT/ML IJ SOLN
INTRAMUSCULAR | Status: AC
  Filled 2019-03-18: qty 4

## 2019-03-18 MED ORDER — MIDAZOLAM HCL 2 MG/2ML IJ SOLN
INTRAMUSCULAR | Status: AC | PRN
  Administered 2019-03-18: 0.5 mg via INTRAVENOUS

## 2019-03-18 MED ORDER — MIDAZOLAM HCL 2 MG/2ML IJ SOLN
INTRAMUSCULAR | Status: AC
  Filled 2019-03-18: qty 2

## 2019-03-18 MED ORDER — LIDOCAINE HCL 1 % IJ SOLN
INTRAMUSCULAR | Status: AC | PRN
  Administered 2019-03-18: 10 mL

## 2019-03-18 NOTE — Progress Notes (Signed)
PROGRESS NOTE    Eddie Davies    Code Status: Full Code  TTS:[id number] DOB: 07-Apr-1970 DOA: 03/12/2019 LOS: 5 days  PCP: [facility] CC:  Chief Complaint  Patient presents with  . Emesis       Hospital Summary  Eddie Davies is a 49 y.o. male with a history of ESRD on HD, ESBL who was admitted early this morning by Dr. Alcario Drought for retroperitoneal abscess from SNF.  He was recently admitted for left-sided pyelonephritis and sepsis status post left-sided radical nephrectomy and right double-J stent placement at OSH who presented with N/V/fever.  Has since been evaluated by nephrology and patient underwent HD today.   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  2/18: Status post CT-guided drain placement x2.  ID consulted. 2/19: Tunneled HD catheter removed via IR 2/22: R IJ TDC placed by IR  A & P   Principal Problem:   Retroperitoneal abscess ([place]) Active Problems:   End stage liver disease ([place])   Seizures ([place])   Sepsis ([place])   Benign essential HTN   Diabetes mellitus type 2, uncontrolled, with complications ([place])   ESRD (end stage renal disease) on dialysis ([place])   Infection due to ESBL-producing Klebsiella pneumoniae    1. Left retroperitoneal Klebsiella pneumonia  abscess with history of ESBL Klebsiella bacteremia  1. 2/18: status post CT-guided drain placement x2 via IR 2. 2/19: Tunneled HD catheter removed via IR 3. 2/22: R IJ TDC placed 4. Per ID: 2 weeks of Carbapenem therapy from date of HD line removal (2/19->3/4) then switch to cefepime with HD and complete an additional 2 weeks. 5. Repeat CT scan when drain output<10 to 15 mL/day for 2-3 days 2. Hypokalemia, resolved 3. 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 ESRD on HD M, W, F  1. HD catheter removed as above, s/p 48 hour line holiday, replaced today 2. HD today 3. nephro onboard 5. Seizure disorder continue Tegretol and phenobarbital 6. Diabetes -continue Levemir 8 units daily, continue sliding scale 7. Metabolic bone diseaseOn PhosLo 8. Normocytic Anemia Was given ESA as outpatient 9. Hypertension continue current regimen and volume removal per nephro 10. Chronic nonobstructing right urolithiasis -stable, urology on board 11. Hyperlipidemia -on statin   DVT prophylaxis: Subcu Heparin Family Communication: No family at bedside Disposition Plan:   Patient came from:   SNF                                                                                          Anticipated d/c place: TBD  Barriers to d/c: Medical stability, consultants, PT  Pressure injury documentation   Pressure Injury 04/21/18 Stage II -  Partial thickness loss of dermis presenting as a shallow open ulcer with a red, pink wound bed without slough. redness, possible previous injury (Active)  04/21/18 2200  Location: Coccyx  Location Orientation: Medial  Staging: Stage II -  Partial thickness loss of dermis presenting as a shallow open ulcer with a red, pink wound bed without slough.  Wound Description (Comments): redness, possible previous injury  Present on Admission: Yes     Consultants  ID urology Nephrology IR  Procedures  2/17 HD, on MWF schedule 2/18 CT-guided drain placement x2 2/19 HD catheter removal 2/22 R  IJ tunneled dialysis catheter placed by IR  Antibiotics   Anti-infectives (From admission, onward)   Start     Dose/Rate Route Frequency Ordered Stop   03/18/19 1015  ceFAZolin (ANCEF) IVPB 2g/100 mL premix  Status:  Discontinued     2 g 200 mL/hr over 30 Minutes Intravenous  Once 03/18/19 1006 03/18/19 1128   03/17/19 2200  meropenem (MERREM) 500 mg in sodium chloride 0.9 % 100 mL IVPB     500 mg 200 mL/hr over 30 Minutes Intravenous Every 24 hours 03/17/19 1608     03/13/19 2200  meropenem (MERREM) 500 mg in sodium chloride 0.9 % 100 mL IVPB  Status:  Discontinued     500 mg 200 mL/hr over 30 Minutes Intravenous Every 24 hours 03/12/19 2210 03/17/19 1550   03/12/19 2215  piperacillin-tazobactam (ZOSYN) IVPB 3.375 g  Status:  Discontinued     3.375 g 12.5 mL/hr over 240 Minutes Intravenous  Once 03/12/19 2205 03/12/19 2208   03/12/19 2215  meropenem (MERREM) 1 g in sodium chloride 0.9 % 100 mL IVPB     1 g 200 mL/hr over 30 Minutes Intravenous  Once 03/12/19 2210 03/13/19 0223        Subjective   Seen and examined in HD. Reporting that he is hungry since he has not eaten much since yesterday for upcoming procedure. Otherwise no complaints. I explained to him the current plan in regards to antibiotics. He understood. No overnight events.   Objective   Vitals:   03/18/19 1315 03/18/19 1320 03/18/19 1330 03/18/19 1400  BP: 121/75 125/81 126/82 (!) 147/90  Pulse: 67 64 67 85  Resp: 17 18 15 16   Temp: 98.9 F (37.2 C)     TempSrc: Oral     SpO2: 100%     Weight: 91 kg     Height:        Intake/Output Summary (Last 24 hours) at 03/18/2019 1441 Last data filed at 03/18/2019 1209 Gross per 24 hour  Intake 845 ml  Output 231 ml  Net 614 ml   Filed Weights   03/15/19 0935 03/15/19 1330 03/18/19 1315  Weight: 89.7 kg 88.2 kg 91 kg    Examination:  Physical Exam Vitals and nursing note reviewed.  Constitutional:      Appearance: Normal appearance.  HENT:     Head:  Normocephalic and atraumatic.  Cardiovascular:     Rate and Rhythm: Normal rate and regular rhythm.  Pulmonary:     Effort: Pulmonary effort is normal.     Breath sounds: Normal breath sounds.  Abdominal:     General: Abdomen is flat.     Palpations: Abdomen is soft.  Musculoskeletal:     Comments: Right chest dialysis cath  Skin:    General: Skin is dry.  Neurological:  Mental Status: He is alert. Mental status is at baseline.  Psychiatric:        Mood and Affect: Mood normal.        Behavior: Behavior normal.     Data Reviewed: I have personally reviewed following labs and imaging studies  CBC: Recent Labs  Lab 03/12/19 2331 03/12/19 2331 03/13/19 0423 03/14/19 0154 03/15/19 0241 03/16/19 0135 03/18/19 0559  WBC 8.5   < > 8.5 9.1 6.0 5.3 3.7*  NEUTROABS 7.0  --   --   --   --   --  2.3  HGB 8.3*   < > 8.2* 8.4* 8.0* 8.0* 8.0*  HCT 25.9*   < > 25.3* 25.6* 25.1* 25.2* 25.2*  MCV 94.2   < > 93.0 92.4 95.4 94.0 95.8  PLT 228   < > 216 200 198 184 133*   < > = values in this interval not displayed.   Basic Metabolic Panel: Recent Labs  Lab 03/12/19 2331 03/13/19 0423 03/13/19 1230 03/14/19 0154 03/15/19 0241 03/16/19 0135 03/16/19 1443 03/17/19 0259 03/18/19 0559  NA  --    < > 136   < > 136 137 136 137 137  K  --    < > 3.2*   < > 3.4* 2.7* 3.0* 3.0* 3.8  CL  --    < > 103   < > 101 101 101 102 103  CO2  --    < > 21*   < > 23 25 25 25  21*  GLUCOSE  --    < > 102*   < > 275* 188* 293* 250* 163*  BUN  --    < > 43*   < > 27* 16 22* 33* 47*  CREATININE  --    < > 5.91*   < > 4.49* 2.78* 3.49* 4.01* 4.68*  CALCIUM  --    < > 7.8*   < > 7.7* 7.7* 7.7* 7.7* 8.0*  MG 1.8  --   --   --   --   --   --   --   --   PHOS  --   --  5.6*  --  4.0 2.0*  --  3.6  --    < > = values in this interval not displayed.   GFR: Estimated Creatinine Clearance: 22.4 mL/min (A) (by C-G formula based on SCr of 4.68 mg/dL (H)). Liver Function Tests: Recent Labs  Lab  03/12/19 2051 03/12/19 2051 03/13/19 0423 03/13/19 1230 03/15/19 0241 03/16/19 0135 03/17/19 0259  AST 10*  --  10*  --   --   --   --   ALT 8  --  7  --   --   --   --   ALKPHOS 124  --  108  --   --   --   --   BILITOT 0.8  --  0.4  --   --   --   --   PROT 6.6  --  5.6*  --   --   --   --   ALBUMIN 1.8*   < > 1.5* 1.5* 1.5* 1.6* 1.5*   < > = values in this interval not displayed.   Recent Labs  Lab 03/12/19 2051  LIPASE 13   Recent Labs  Lab 03/12/19 2051  AMMONIA 61*   Coagulation Profile: Recent Labs  Lab 03/12/19 2331 03/18/19 0559  INR 1.7* 1.2   Cardiac Enzymes: No results  for input(s): CKTOTAL, CKMB, CKMBINDEX, TROPONINI in the last 168 hours. BNP (last 3 results) No results for input(s): PROBNP in the last 8760 hours. HbA1C: No results for input(s): HGBA1C in the last 72 hours. CBG: Recent Labs  Lab 03/17/19 2005 03/18/19 0034 03/18/19 0430 03/18/19 0745 03/18/19 1151  GLUCAP 170* 159* 136* 185* 190*   Lipid Profile: No results for input(s): CHOL, HDL, LDLCALC, TRIG, CHOLHDL, LDLDIRECT in the last 72 hours. Thyroid Function Tests: No results for input(s): TSH, T4TOTAL, FREET4, T3FREE, THYROIDAB in the last 72 hours. Anemia Panel: No results for input(s): VITAMINB12, FOLATE, FERRITIN, TIBC, IRON, RETICCTPCT in the last 72 hours. Sepsis Labs: Recent Labs  Lab 03/12/19 2051 03/13/19 1139  LATICACIDVEN 0.8 0.8    Recent Results (from the past 240 hour(s))  Blood Culture (routine x 2)     Status: None   Collection Time: 03/12/19  9:21 PM   Specimen: BLOOD  Result Value Ref Range Status   Specimen Description BLOOD RIGHT ANTECUBITAL  Final   Special Requests   Final    BOTTLES DRAWN AEROBIC AND ANAEROBIC Blood Culture adequate volume   Culture   Final    NO GROWTH 5 DAYS Performed at [facility], [address] [place], [place] [address]    Report Status 03/17/2019 FINAL  Final  Blood Culture (routine x 2)     Status: None    Collection Time: 03/12/19  9:21 PM   Specimen: BLOOD RIGHT HAND  Result Value Ref Range Status   Specimen Description BLOOD RIGHT HAND  Final   Special Requests   Final    BOTTLES DRAWN AEROBIC AND ANAEROBIC Blood Culture adequate volume   Culture   Final    NO GROWTH 5 DAYS Performed at [facility], [place] [address], [place], [place] [address]    Report Status 03/17/2019 FINAL  Final  Urine culture     Status: Abnormal   Collection Time: 03/13/19  7:24 AM   Specimen: In/Out Cath Urine  Result Value Ref Range Status   Specimen Description IN/OUT CATH URINE  Final   Special Requests   Final    NONE Performed at [facility], [place] [address], [place], [place] [address]    Culture MULTIPLE SPECIES PRESENT, SUGGEST RECOLLECTION (A)  Final   Report Status 03/14/2019 FINAL  Final  Aerobic/Anaerobic Culture (surgical/deep wound)     Status: None (Preliminary result)   Collection Time: 03/14/19 11:01 AM   Specimen: Abscess  Result Value Ref Range Status   Specimen Description ABSCESS  Final   Special Requests NONE  Final   Gram Stain   Final    ABUNDANT WBC PRESENT, PREDOMINANTLY PMN NO ORGANISMS SEEN Performed at [facility], [place] [address], [place], [place] [address]    Culture   Final    MODERATE KLEBSIELLA PNEUMONIAE NO ANAEROBES ISOLATED; CULTURE IN PROGRESS FOR 5 DAYS    Report Status PENDING  Incomplete   Organism ID, Bacteria KLEBSIELLA PNEUMONIAE  Final      Susceptibility   Klebsiella pneumoniae - MIC*    AMPICILLIN >=32 RESISTANT Resistant     CEFAZOLIN 16 SENSITIVE Sensitive     CEFEPIME <=0.12 SENSITIVE Sensitive     CEFTAZIDIME <=1 SENSITIVE Sensitive     CEFTRIAXONE <=0.25 SENSITIVE Sensitive     CIPROFLOXACIN 1 SENSITIVE Sensitive     GENTAMICIN 8 INTERMEDIATE Intermediate     IMIPENEM 1 SENSITIVE Sensitive     TRIMETH/SULFA >=  320 RESISTANT Resistant     AMPICILLIN/SULBACTAM >=32 RESISTANT Resistant     PIP/TAZO 45 INTERMEDIATE Intermediate      * MODERATE KLEBSIELLA PNEUMONIAE         Radiology Studies: IR Fluoro Guide CV Line Right  Result Date: 03/18/2019 INDICATION: 49 year old male with a history of renal failure, line holiday EXAM: TUNNELED CENTRAL VENOUS HEMODIALYSIS CATHETER PLACEMENT WITH ULTRASOUND AND FLUOROSCOPIC GUIDANCE MEDICATIONS: 2 g Ancef. The antibiotic was given in an appropriate time interval prior to skin puncture. ANESTHESIA/SEDATION: Moderate (conscious) sedation was employed during this procedure. A total of Versed 0.5 mg and Fentanyl 12.5 mcg was administered intravenously. Moderate Sedation Time: 15 minutes. The patient's level of consciousness and vital signs were monitored continuously by radiology nursing throughout the procedure under my direct supervision. FLUOROSCOPY TIME:  Fluoroscopy Time: 1 minutes 0 seconds (7 mGy). COMPLICATIONS: None PROCEDURE: Informed written consent was obtained from the patient after a discussion of the risks, benefits, and alternatives to treatment. Questions regarding the procedure were encouraged and answered. The right neck and chest were prepped with chlorhexidine in a sterile fashion, and a sterile drape was applied covering the operative field. Maximum barrier sterile technique with sterile gowns and gloves were used for the procedure. A timeout was performed prior to the initiation of the procedure. After creating a small venotomy incision, a micropuncture kit was utilized to access the right internal jugular vein under direct, real-time ultrasound guidance after the overlying soft tissues were anesthetized with 1% lidocaine with epinephrine. Ultrasound image documentation was performed. The microwire was marked to measure appropriate internal catheter length. External tunneled length was estimated. A total tip to cuff length of 19 cm was selected. Skin and subcutaneous tissues of chest wall below the clavicle were generously infiltrated with 1% lidocaine for local anesthesia.  A small stab incision was made with 11 blade scalpel. The selected hemodialysis catheter was tunneled in a retrograde fashion from the anterior chest wall to the venotomy incision. A guidewire was advanced to the level of the IVC and the micropuncture sheath was exchanged for a peel-away sheath. The catheter was then placed through the peel-away sheath with tips ultimately positioned within the superior aspect of the right atrium. Final catheter positioning was confirmed and documented with a spot radiographic image. The catheter aspirates and flushes normally. The catheter was flushed with appropriate volume heparin dwells. The catheter exit site was secured with a 0-Prolene retention suture. The venotomy incision was closed Derma bond and sterile dressing. Dressings were applied at the chest wall. Patient tolerated the procedure well and remained hemodynamically stable throughout. No complications were encountered and no significant blood loss encountered. IMPRESSION: Status post right IJ tunneled hemodialysis catheter. Catheter ready for use. Signed, Dulcy Fanny. Dellia Nims, RPVI [facility] Electronically Signed   By: Corrie Mckusick D.O.   On: 03/18/2019 12:28   IR US Guide Vasc Access Right  Result Date: 03/18/2019 INDICATION: 49 year old male with a history of renal failure, line holiday EXAM: TUNNELED CENTRAL VENOUS HEMODIALYSIS CATHETER PLACEMENT WITH ULTRASOUND AND FLUOROSCOPIC GUIDANCE MEDICATIONS: 2 g Ancef. The antibiotic was given in an appropriate time interval prior to skin puncture. ANESTHESIA/SEDATION: Moderate (conscious) sedation was employed during this procedure. A total of Versed 0.5 mg and Fentanyl 12.5 mcg was administered intravenously. Moderate Sedation Time: 15 minutes. The patient's level of consciousness and vital signs were monitored continuously by radiology nursing throughout the procedure under my direct supervision.  FLUOROSCOPY TIME:  Fluoroscopy  Time: 1 minutes 0 seconds (7 mGy). COMPLICATIONS: None PROCEDURE: Informed written consent was obtained from the patient after a discussion of the risks, benefits, and alternatives to treatment. Questions regarding the procedure were encouraged and answered. The right neck and chest were prepped with chlorhexidine in a sterile fashion, and a sterile drape was applied covering the operative field. Maximum barrier sterile technique with sterile gowns and gloves were used for the procedure. A timeout was performed prior to the initiation of the procedure. After creating a small venotomy incision, a micropuncture kit was utilized to access the right internal jugular vein under direct, real-time ultrasound guidance after the overlying soft tissues were anesthetized with 1% lidocaine with epinephrine. Ultrasound image documentation was performed. The microwire was marked to measure appropriate internal catheter length. External tunneled length was estimated. A total tip to cuff length of 19 cm was selected. Skin and subcutaneous tissues of chest wall below the clavicle were generously infiltrated with 1% lidocaine for local anesthesia. A small stab incision was made with 11 blade scalpel. The selected hemodialysis catheter was tunneled in a retrograde fashion from the anterior chest wall to the venotomy incision. A guidewire was advanced to the level of the IVC and the micropuncture sheath was exchanged for a peel-away sheath. The catheter was then placed through the peel-away sheath with tips ultimately positioned within the superior aspect of the right atrium. Final catheter positioning was confirmed and documented with a spot radiographic image. The catheter aspirates and flushes normally. The catheter was flushed with appropriate volume heparin dwells. The catheter exit site was secured with a 0-Prolene retention suture. The venotomy incision was closed Derma bond and sterile dressing.  Dressings were applied at the chest wall. Patient tolerated the procedure well and remained hemodynamically stable throughout. No complications were encountered and no significant blood loss encountered. IMPRESSION: Status post right IJ tunneled hemodialysis catheter. Catheter ready for use. Signed, Dulcy Fanny. Dellia Nims, RPVI [facility] Radiology Electronically Signed   By: Corrie Mckusick D.O.   On: 03/18/2019 12:28        Scheduled Meds: . allopurinol  100 mg Oral Daily  . carbamazepine  900 mg Oral BID  . Chlorhexidine Gluconate Cloth  6 each Topical Q0600  . feeding supplement (NEPRO CARB STEADY)  237 mL Oral TID BM  . ferrous sulfate  325 mg Oral Q breakfast  . FLUoxetine  20 mg Oral Daily  . gelatin adsorbable      . heparin      . insulin aspart  0-9 Units Subcutaneous Q4H  . insulin detemir  8 Units Subcutaneous Daily  . lactulose  20 g Oral BID  . lidocaine      . multivitamin with minerals  1 tablet Oral QHS  . pantoprazole  40 mg Oral Daily  . PHENobarbital  243 mg Oral QHS  . simvastatin  20 mg Oral QHS  . sodium chloride flush  5 mL Intracatheter Q8H  . tamsulosin  0.4 mg Oral QHS   Continuous Infusions: . meropenem (MERREM) IV 500 mg (03/17/19 2240)     Time spent: 16 minutes with over 50% of the time coordinating the patient's care    Harold Hedge, DO [facility] Pager [phone]  Call night coverage person covering after 7pm

## 2019-03-18 NOTE — Progress Notes (Signed)
PT Cancellation Note  Patient Details Name: Eddie Davies MRN: [id number] DOB: 1970/02/05   Cancelled Treatment:    Reason Eval/Treat Not Completed: Patient at procedure or test/unavailable(pt leaving for IR)   Sharena Dibenedetto B Winifred Balogh 03/18/2019, 9:23 AM  Bayard Males, PT Acute Rehabilitation Services Pager: [phone] Office: [phone]

## 2019-03-18 NOTE — Progress Notes (Signed)
[facility] Progress Note   Subjective: Seen at onset of HD. S/p new TDC placement this morning. 2L UFG and tolerating so far. No CP or dyspnea.    Objective Vitals:   03/18/19 1105 03/18/19 1110 03/18/19 1115 03/18/19 1145  BP: (!) 122/92 120/78 127/83 131/81  Pulse: 66 69 71   Resp: 12 15 16 18   Temp:    98.1 F (36.7 C)  TempSrc:    Oral  SpO2: 100% 100% 100% 99%  Weight:      Height:       Physical Exam General: Chronically ill appearing man, NAD. Diffuse dry skin Heart: RRR; no murmur Lungs: CTAB Abdomen: soft, non-tender. Two JP drains to L flank - minimal serosang output today. Extremities: No LE edema Dialysis Access:  [facility] with large bandage in R chest  Additional Objective Labs: Basic Metabolic Panel: Recent Labs  Lab 03/15/19 0241 03/15/19 0241 03/16/19 0135 03/16/19 0135 03/16/19 1443 03/17/19 0259 03/18/19 0559  NA 136   < > 137   < > 136 137 137  K 3.4*   < > 2.7*   < > 3.0* 3.0* 3.8  CL 101   < > 101   < > 101 102 103  CO2 23   < > 25   < > 25 25 21*  GLUCOSE 275*   < > 188*   < > 293* 250* 163*  BUN 27*   < > 16   < > 22* 33* 47*  CREATININE 4.49*   < > 2.78*   < > 3.49* 4.01* 4.68*  CALCIUM 7.7*   < > 7.7*   < > 7.7* 7.7* 8.0*  PHOS 4.0  --  2.0*  --   --  3.6  --    < > = values in this interval not displayed.   Liver Function Tests: Recent Labs  Lab 03/12/19 2051 03/12/19 2051 03/13/19 0423 03/13/19 1230 03/15/19 0241 03/16/19 0135 03/17/19 0259  AST 10*  --  10*  --   --   --   --   ALT 8  --  7  --   --   --   --   ALKPHOS 124  --  108  --   --   --   --   BILITOT 0.8  --  0.4  --   --   --   --   PROT 6.6  --  5.6*  --   --   --   --   ALBUMIN 1.8*   < > 1.5*   < > 1.5* 1.6* 1.5*   < > = values in this interval not displayed.   CBC: Recent Labs  Lab 03/12/19 2331 03/12/19 2331 03/13/19 0423 03/13/19 0423 03/14/19 0154 03/14/19 0154 03/15/19 0241 03/16/19 0135 03/18/19 0559  WBC 8.5   < > 8.5   < >  9.1   < > 6.0 5.3 3.7*  NEUTROABS 7.0  --   --   --   --   --   --   --  2.3  HGB 8.3*   < > 8.2*   < > 8.4*   < > 8.0* 8.0* 8.0*  HCT 25.9*   < > 25.3*   < > 25.6*   < > 25.1* 25.2* 25.2*  MCV 94.2   < > 93.0  --  92.4  --  95.4 94.0 95.8  PLT 228   < > 216   < >  200   < > 198 184 133*   < > = values in this interval not displayed.   Blood Culture    Component Value Date/Time   SDES ABSCESS 03/14/2019 1101   SPECREQUEST NONE 03/14/2019 1101   CULT  03/14/2019 1101    MODERATE KLEBSIELLA PNEUMONIAE NO ANAEROBES ISOLATED; CULTURE IN PROGRESS FOR 5 DAYS    REPTSTATUS PENDING 03/14/2019 1101   Studies/Results: IR Fluoro Guide CV Line Right  Result Date: 03/18/2019 INDICATION: 49 year old male with a history of renal failure, line holiday EXAM: TUNNELED CENTRAL VENOUS HEMODIALYSIS CATHETER PLACEMENT WITH ULTRASOUND AND FLUOROSCOPIC GUIDANCE MEDICATIONS: 2 g Ancef. The antibiotic was given in an appropriate time interval prior to skin puncture. ANESTHESIA/SEDATION: Moderate (conscious) sedation was employed during this procedure. A total of Versed 0.5 mg and Fentanyl 12.5 mcg was administered intravenously. Moderate Sedation Time: 15 minutes. The patient's level of consciousness and vital signs were monitored continuously by radiology nursing throughout the procedure under my direct supervision. FLUOROSCOPY TIME:  Fluoroscopy Time: 1 minutes 0 seconds (7 mGy). COMPLICATIONS: None PROCEDURE: Informed written consent was obtained from the patient after a discussion of the risks, benefits, and alternatives to treatment. Questions regarding the procedure were encouraged and answered. The right neck and chest were prepped with chlorhexidine in a sterile fashion, and a sterile drape was applied covering the operative field. Maximum barrier sterile technique with sterile gowns and gloves were used for the procedure. A timeout was performed prior to the initiation of the procedure. After creating a small  venotomy incision, a micropuncture kit was utilized to access the right internal jugular vein under direct, real-time ultrasound guidance after the overlying soft tissues were anesthetized with 1% lidocaine with epinephrine. Ultrasound image documentation was performed. The microwire was marked to measure appropriate internal catheter length. External tunneled length was estimated. A total tip to cuff length of 19 cm was selected. Skin and subcutaneous tissues of chest wall below the clavicle were generously infiltrated with 1% lidocaine for local anesthesia. A small stab incision was made with 11 blade scalpel. The selected hemodialysis catheter was tunneled in a retrograde fashion from the anterior chest wall to the venotomy incision. A guidewire was advanced to the level of the IVC and the micropuncture sheath was exchanged for a peel-away sheath. The catheter was then placed through the peel-away sheath with tips ultimately positioned within the superior aspect of the right atrium. Final catheter positioning was confirmed and documented with a spot radiographic image. The catheter aspirates and flushes normally. The catheter was flushed with appropriate volume heparin dwells. The catheter exit site was secured with a 0-Prolene retention suture. The venotomy incision was closed Derma bond and sterile dressing. Dressings were applied at the chest wall. Patient tolerated the procedure well and remained hemodynamically stable throughout. No complications were encountered and no significant blood loss encountered. IMPRESSION: Status post right IJ tunneled hemodialysis catheter. Catheter ready for use. Signed, Dulcy Fanny. Dellia Nims, RPVI [facility] Radiology Electronically Signed   By: Corrie Mckusick D.O.   On: 03/18/2019 12:28   IR US Guide Vasc Access Right  Result Date: 03/18/2019 INDICATION: 49 year old male with a history of renal failure, line holiday EXAM:  TUNNELED CENTRAL VENOUS HEMODIALYSIS CATHETER PLACEMENT WITH ULTRASOUND AND FLUOROSCOPIC GUIDANCE MEDICATIONS: 2 g Ancef. The antibiotic was given in an appropriate time interval prior to skin puncture. ANESTHESIA/SEDATION: Moderate (conscious) sedation was employed during this procedure. A total of Versed 0.5 mg and Fentanyl 12.5  mcg was administered intravenously. Moderate Sedation Time: 15 minutes. The patient's level of consciousness and vital signs were monitored continuously by radiology nursing throughout the procedure under my direct supervision. FLUOROSCOPY TIME:  Fluoroscopy Time: 1 minutes 0 seconds (7 mGy). COMPLICATIONS: None PROCEDURE: Informed written consent was obtained from the patient after a discussion of the risks, benefits, and alternatives to treatment. Questions regarding the procedure were encouraged and answered. The right neck and chest were prepped with chlorhexidine in a sterile fashion, and a sterile drape was applied covering the operative field. Maximum barrier sterile technique with sterile gowns and gloves were used for the procedure. A timeout was performed prior to the initiation of the procedure. After creating a small venotomy incision, a micropuncture kit was utilized to access the right internal jugular vein under direct, real-time ultrasound guidance after the overlying soft tissues were anesthetized with 1% lidocaine with epinephrine. Ultrasound image documentation was performed. The microwire was marked to measure appropriate internal catheter length. External tunneled length was estimated. A total tip to cuff length of 19 cm was selected. Skin and subcutaneous tissues of chest wall below the clavicle were generously infiltrated with 1% lidocaine for local anesthesia. A small stab incision was made with 11 blade scalpel. The selected hemodialysis catheter was tunneled in a retrograde fashion from the anterior chest wall to the venotomy incision. A guidewire was advanced to  the level of the IVC and the micropuncture sheath was exchanged for a peel-away sheath. The catheter was then placed through the peel-away sheath with tips ultimately positioned within the superior aspect of the right atrium. Final catheter positioning was confirmed and documented with a spot radiographic image. The catheter aspirates and flushes normally. The catheter was flushed with appropriate volume heparin dwells. The catheter exit site was secured with a 0-Prolene retention suture. The venotomy incision was closed Derma bond and sterile dressing. Dressings were applied at the chest wall. Patient tolerated the procedure well and remained hemodynamically stable throughout. No complications were encountered and no significant blood loss encountered. IMPRESSION: Status post right IJ tunneled hemodialysis catheter. Catheter ready for use. Signed, Dulcy Fanny. Dellia Nims, RPVI [facility] Interventional Radiology Specialists [facility] Radiology Electronically Signed   By: Corrie Mckusick D.O.   On: 03/18/2019 12:28   Medications: . meropenem (MERREM) IV 500 mg (03/17/19 2240)   . allopurinol  100 mg Oral Daily  . carbamazepine  900 mg Oral BID  . Chlorhexidine Gluconate Cloth  6 each Topical Q0600  . feeding supplement (NEPRO CARB STEADY)  237 mL Oral TID BM  . ferrous sulfate  325 mg Oral Q breakfast  . FLUoxetine  20 mg Oral Daily  . gelatin adsorbable      . heparin      . insulin aspart  0-9 Units Subcutaneous Q4H  . insulin detemir  8 Units Subcutaneous Daily  . lactulose  20 g Oral BID  . lidocaine      . multivitamin with minerals  1 tablet Oral QHS  . pantoprazole  40 mg Oral Daily  . PHENobarbital  243 mg Oral QHS  . simvastatin  20 mg Oral QHS  . sodium chloride flush  5 mL Intracatheter Q8H  . 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 Urology consulted -  not felt to be candidate for open wash-out,S/pIR drain and Meropenem. IDsaw and recommend removal TDC for line holiday - done. New TDC placement this AM. Per ID - 2 more weeks of Mero then Cefepime q HD for another 2 weeks. 2. ESRD:Will continue HD on MWF schedule-HD today. 3.  Hypokalemia: Improved today - using 4K bath with HD. 4. Hypertension/volume:BPstable - well under EDW per weights, will get standing weight if able. 5. Anemia:Hgb 8 - last given ESA as outpt2/10/21- follow for now. Repeating iron studies. 6. Metabolic bone CXWNPIO:PPUGGP6.6,TPEL 3.6 - binders held as of 2/20 - follow. 7. T2DM: Per primary. 8.  R kidney stone: Per CT - non-obstructing at the moment, asymptomatic   Eddie Penton, [name] 03/18/2019, 1:29 PM  [facility] Pager: [phone]

## 2019-03-18 NOTE — Procedures (Signed)
Interventional Radiology Procedure Note  Procedure: Placement of a right IJ approach tunneled HD catheter. 19cm tip to cuff.  Tip is positioned at the superior cavoatrial junction and catheter is ready for immediate use.  Complications: None Recommendations:  - Ok to shower tomorrow - Do not submerge for 7 days - Routine line care   Signed,  Dulcy Fanny. Earleen Newport, DO

## 2019-03-19 LAB — CBC
HCT: 25.3 % — ABNORMAL LOW (ref 39.0–52.0)
Hemoglobin: 8.1 g/dL — ABNORMAL LOW (ref 13.0–17.0)
MCH: 30 pg (ref 26.0–34.0)
MCHC: 32 g/dL (ref 30.0–36.0)
MCV: 93.7 fL (ref 80.0–100.0)
Platelets: 154 10*3/uL (ref 150–400)
RBC: 2.7 MIL/uL — ABNORMAL LOW (ref 4.22–5.81)
RDW: 16.4 % — ABNORMAL HIGH (ref 11.5–15.5)
WBC: 4.5 10*3/uL (ref 4.0–10.5)
nRBC: 0 % (ref 0.0–0.2)

## 2019-03-19 LAB — BASIC METABOLIC PANEL
Anion gap: 8 (ref 5–15)
BUN: 25 mg/dL — ABNORMAL HIGH (ref 6–20)
CO2: 25 mmol/L (ref 22–32)
Calcium: 7.5 mg/dL — ABNORMAL LOW (ref 8.9–10.3)
Chloride: 105 mmol/L (ref 98–111)
Creatinine, Ser: 2.8 mg/dL — ABNORMAL HIGH (ref 0.61–1.24)
GFR calc Af Amer: 30 mL/min — ABNORMAL LOW (ref >60)
GFR calc non Af Amer: 26 mL/min — ABNORMAL LOW (ref >60)
Glucose, Bld: 154 mg/dL — ABNORMAL HIGH (ref 70–99)
Potassium: 3.5 mmol/L (ref 3.5–5.1)
Sodium: 138 mmol/L (ref 135–145)

## 2019-03-19 LAB — AEROBIC/ANAEROBIC CULTURE W GRAM STAIN (SURGICAL/DEEP WOUND)

## 2019-03-19 LAB — GLUCOSE, CAPILLARY
Glucose-Capillary: 103 mg/dL — ABNORMAL HIGH (ref 70–99)
Glucose-Capillary: 136 mg/dL — ABNORMAL HIGH (ref 70–99)
Glucose-Capillary: 157 mg/dL — ABNORMAL HIGH (ref 70–99)
Glucose-Capillary: 238 mg/dL — ABNORMAL HIGH (ref 70–99)
Glucose-Capillary: 243 mg/dL — ABNORMAL HIGH (ref 70–99)
Glucose-Capillary: 258 mg/dL — ABNORMAL HIGH (ref 70–99)
Glucose-Capillary: 85 mg/dL (ref 70–99)

## 2019-03-19 LAB — SARS CORONAVIRUS 2 (TAT 6-24 HRS): SARS Coronavirus 2: NEGATIVE

## 2019-03-19 MED ORDER — INSULIN DETEMIR 100 UNIT/ML ~~LOC~~ SOLN
10.0000 [IU] | Freq: Every day | SUBCUTANEOUS | Status: DC
  Administered 2019-03-20 – 2019-03-23 (×4): 10 [IU] via SUBCUTANEOUS
  Filled 2019-03-19 (×5): qty 0.1

## 2019-03-19 NOTE — Plan of Care (Signed)

## 2019-03-19 NOTE — Progress Notes (Signed)
PT Cancellation Note  Patient Details Name: Eddie Davies MRN: [id number] DOB: 1970-12-11   Cancelled Treatment:    Reason Eval/Treat Not Completed: Patient declined, no reason specified(pt stated, "I don't want to" when asked to participate in bed level exercises and bed mobility. Benefits of mobility explained. Pt stated SNF was using [facility] to transfer him to [facility] for dialysis. Will follow.)   Philomena Doheny PT 03/19/2019  Acute Rehabilitation Services Pager [phone] Office [phone]

## 2019-03-19 NOTE — Progress Notes (Signed)
[facility] Progress Note   Subjective:  Delayed note - seen in room this morning ~9am. No overnight complaints. No CP/dyspnea.   Objective Vitals:   03/18/19 1650 03/18/19 1751 03/18/19 2058 03/19/19 0429  BP: 111/64 127/72 116/68 122/78  Pulse: 83 79 82 77  Resp: _0 Temp: 98.1 F (36.7 C) 98 F (36.7 C) 98.4 F (36.9 C) 98 F (36.7 C)  TempSrc: Oral Oral Oral Oral  SpO2: 98% 100% 100% 100%  Weight: 90.2 kg     Height:       Physical Exam General: Chronically ill appearing man, NAD. Diffuse dry skin Heart: RRR; no murmur Lungs: CTAB Abdomen: soft, non-tender. Two JP drains to L flank - minimal serosang output today. Extremities: No LE edema Dialysis Access:  TDC in R chest, maturing RUE AVF + thrill  Additional Objective Labs: Basic Metabolic Panel: Recent Labs  Lab 03/15/19 0241 03/15/19 0241 03/16/19 0135 03/16/19 1443 03/17/19 0259 03/18/19 0559 03/19/19 0122  NA 136   < > 137   < > 137 137 138  K 3.4*   < > 2.7*   < > 3.0* 3.8 3.5  CL 101   < > 101   < > 102 103 105  CO2 23   < > 25   < > 25 21* 25  GLUCOSE 275*   < > 188*   < > 250* 163* 154*  BUN 27*   < > 16   < > 33* 47* 25*  CREATININE 4.49*   < > 2.78*   < > 4.01* 4.68* 2.80*  CALCIUM 7.7*   < > 7.7*   < > 7.7* 8.0* 7.5*  PHOS 4.0  --  2.0*  --  3.6  --   --    < > = values in this interval not displayed.   Liver Function Tests: Recent Labs  Lab 03/12/19 2051 03/12/19 2051 03/13/19 0423 03/13/19 1230 03/15/19 0241 03/16/19 0135 03/17/19 0259  AST 10*  --  10*  --   --   --   --   ALT 8  --  7  --   --   --   --   ALKPHOS 124  --  108  --   --   --   --   BILITOT 0.8  --  0.4  --   --   --   --   PROT 6.6  --  5.6*  --   --   --   --   ALBUMIN 1.8*   < > 1.5*   < > 1.5* 1.6* 1.5*   < > = values in this interval not displayed.   Recent Labs  Lab 03/12/19 2051  LIPASE 13   CBC: Recent Labs  Lab 03/12/19 2331 03/13/19 0423 03/14/19 0154 03/14/19 0154  03/15/19 0241 03/15/19 0241 03/16/19 0135 03/18/19 0559 03/19/19 0122  WBC 8.5   < > 9.1   < > 6.0   < > 5.3 3.7* 4.5  NEUTROABS 7.0  --   --   --   --   --   --  2.3  --   HGB 8.3*   < > 8.4*   < > 8.0*   < > 8.0* 8.0* 8.1*  HCT 25.9*   < > 25.6*   < > 25.1*   < > 25.2* 25.2* 25.3*  MCV 94.2   < > 92.4  --  95.4  --  94.0 95.8 93.7  PLT 228   < > 200   < > 198   < > 184 133* 154   < > = values in this interval not displayed.   CBG: Recent Labs  Lab 03/18/19 2327 03/19/19 0426 03/19/19 0646 03/19/19 0755 03/19/19 1200  GLUCAP 224* 85 103* 136* 243*   Studies/Results: IR Fluoro Guide CV Line Right  Result Date: 03/18/2019 INDICATION: 49 year old male with a history of renal failure, line holiday EXAM: TUNNELED CENTRAL VENOUS HEMODIALYSIS CATHETER PLACEMENT WITH ULTRASOUND AND FLUOROSCOPIC GUIDANCE MEDICATIONS: 2 g Ancef. The antibiotic was given in an appropriate time interval prior to skin puncture. ANESTHESIA/SEDATION: Moderate (conscious) sedation was employed during this procedure. A total of Versed 0.5 mg and Fentanyl 12.5 mcg was administered intravenously. Moderate Sedation Time: 15 minutes. The patient's level of consciousness and vital signs were monitored continuously by radiology nursing throughout the procedure under my direct supervision. FLUOROSCOPY TIME:  Fluoroscopy Time: 1 minutes 0 seconds (7 mGy). COMPLICATIONS: None PROCEDURE: Informed written consent was obtained from the patient after a discussion of the risks, benefits, and alternatives to treatment. Questions regarding the procedure were encouraged and answered. The right neck and chest were prepped with chlorhexidine in a sterile fashion, and a sterile drape was applied covering the operative field. Maximum barrier sterile technique with sterile gowns and gloves were used for the procedure. A timeout was performed prior to the initiation of the procedure. After creating a small venotomy incision, a micropuncture kit  was utilized to access the right internal jugular vein under direct, real-time ultrasound guidance after the overlying soft tissues were anesthetized with 1% lidocaine with epinephrine. Ultrasound image documentation was performed. The microwire was marked to measure appropriate internal catheter length. External tunneled length was estimated. A total tip to cuff length of 19 cm was selected. Skin and subcutaneous tissues of chest wall below the clavicle were generously infiltrated with 1% lidocaine for local anesthesia. A small stab incision was made with 11 blade scalpel. The selected hemodialysis catheter was tunneled in a retrograde fashion from the anterior chest wall to the venotomy incision. A guidewire was advanced to the level of the IVC and the micropuncture sheath was exchanged for a peel-away sheath. The catheter was then placed through the peel-away sheath with tips ultimately positioned within the superior aspect of the right atrium. Final catheter positioning was confirmed and documented with a spot radiographic image. The catheter aspirates and flushes normally. The catheter was flushed with appropriate volume heparin dwells. The catheter exit site was secured with a 0-Prolene retention suture. The venotomy incision was closed Derma bond and sterile dressing. Dressings were applied at the chest wall. Patient tolerated the procedure well and remained hemodynamically stable throughout. No complications were encountered and no significant blood loss encountered. IMPRESSION: Status post right IJ tunneled hemodialysis catheter. Catheter ready for use. Signed, Dulcy Fanny. Dellia Nims, RPVI Vascular and Interventional Radiology Specialists [facility] Radiology Electronically Signed   By: Corrie Mckusick D.O.   On: 03/18/2019 12:28   IR US Guide Vasc Access Right  Result Date: 03/18/2019 INDICATION: 49 year old male with a history of renal failure, line holiday EXAM: TUNNELED CENTRAL VENOUS HEMODIALYSIS  CATHETER PLACEMENT WITH ULTRASOUND AND FLUOROSCOPIC GUIDANCE MEDICATIONS: 2 g Ancef. The antibiotic was given in an appropriate time interval prior to skin puncture. ANESTHESIA/SEDATION: Moderate (conscious) sedation was employed during this procedure. A total of Versed 0.5 mg and Fentanyl 12.5 mcg was administered intravenously. Moderate Sedation Time: 15 minutes. The patient's  level of consciousness and vital signs were monitored continuously by radiology nursing throughout the procedure under my direct supervision. FLUOROSCOPY TIME:  Fluoroscopy Time: 1 minutes 0 seconds (7 mGy). COMPLICATIONS: None PROCEDURE: Informed written consent was obtained from the patient after a discussion of the risks, benefits, and alternatives to treatment. Questions regarding the procedure were encouraged and answered. The right neck and chest were prepped with chlorhexidine in a sterile fashion, and a sterile drape was applied covering the operative field. Maximum barrier sterile technique with sterile gowns and gloves were used for the procedure. A timeout was performed prior to the initiation of the procedure. After creating a small venotomy incision, a micropuncture kit was utilized to access the right internal jugular vein under direct, real-time ultrasound guidance after the overlying soft tissues were anesthetized with 1% lidocaine with epinephrine. Ultrasound image documentation was performed. The microwire was marked to measure appropriate internal catheter length. External tunneled length was estimated. A total tip to cuff length of 19 cm was selected. Skin and subcutaneous tissues of chest wall below the clavicle were generously infiltrated with 1% lidocaine for local anesthesia. A small stab incision was made with 11 blade scalpel. The selected hemodialysis catheter was tunneled in a retrograde fashion from the anterior chest wall to the venotomy incision. A guidewire was advanced to the level of the IVC and the  micropuncture sheath was exchanged for a peel-away sheath. The catheter was then placed through the peel-away sheath with tips ultimately positioned within the superior aspect of the right atrium. Final catheter positioning was confirmed and documented with a spot radiographic image. The catheter aspirates and flushes normally. The catheter was flushed with appropriate volume heparin dwells. The catheter exit site was secured with a 0-Prolene retention suture. The venotomy incision was closed Derma bond and sterile dressing. Dressings were applied at the chest wall. Patient tolerated the procedure well and remained hemodynamically stable throughout. No complications were encountered and no significant blood loss encountered. IMPRESSION: Status post right IJ tunneled hemodialysis catheter. Catheter ready for use. Signed, Dulcy Fanny. Dellia Nims, RPVI Vascular and Interventional Radiology Specialists [facility] Radiology Electronically Signed   By: Corrie Mckusick D.O.   On: 03/18/2019 12:28   Medications: . meropenem (MERREM) IV 500 mg (03/18/19 2136)   . allopurinol  100 mg Oral Daily  . carbamazepine  900 mg Oral BID  . Chlorhexidine Gluconate Cloth  6 each Topical Q0600  . feeding supplement (NEPRO CARB STEADY)  237 mL Oral TID BM  . ferrous sulfate  325 mg Oral Q breakfast  . FLUoxetine  20 mg Oral Daily  . insulin aspart  0-9 Units Subcutaneous Q4H  . insulin detemir  8 Units Subcutaneous Daily  . lactulose  20 g Oral BID  . multivitamin with minerals  1 tablet Oral QHS  . pantoprazole  40 mg Oral Daily  . PHENobarbital  243 mg Oral QHS  . simvastatin  20 mg Oral QHS  . sodium chloride flush  5 mL Intracatheter Q8H  . tamsulosin  0.4 mg Oral QHS    Dialysis Orders: MWF at [facility] 4:15hr, 400/800, EDW 94kg, 3K/2.25Ca, TDC, heparin 3000 bolus - Mircera 185mg IV q 2 weeks (2/10)  Assessment/Plan: 1. L retroperitoneal abscess: Recurrent/ongoing from prior hospitalization after  nephrectomy. Urology consulted - not felt to be candidate for open wash-out,S/pIR drain and Meropenem. IDsaw and recommend removal TDC for line holiday - done. New TDC placement this AM. Per ID - 2 more weeks of  Mero then Cefepime q HD for another 2 weeks. 2. ESRD:Will continue HD on MWF schedule-next HD 2/24. 3.  Hypokalemia: Improved - using 4K bath with HD. 4. Hypertension/volume:BPstable - well under EDW per weights, will get standing weight if able. 5. Anemia:Hgb 8.1 - last given ESA as outpt2/10/21- follow for now. Repeating iron studies. 6. Metabolic bone disease:CorrCa/Phos ok - binders held as of 2/20 - follow. 7. T2DM: Per primary. 8.  R kidney stone: Per CT - non-obstructing, asymptomatic   Veneta Penton, [name] 03/19/2019, 1:35 PM  [facility] Pager: [phone]

## 2019-03-19 NOTE — TOC Initial Note (Signed)
Transition of Care [facility]) - Initial/Assessment Note    Patient Details  Name: Eddie Davies MRN: [id number] Date of Birth: 10-17-1970  Transition of Care [facility]) CM/SW Contact:    Alexander Mt, LCSW Phone Number: 03/19/2019, 9:59 AM  Clinical Narrative:                 CSW spoke with pt at bedside. Introduced self, role, reason for visit. Pt confirms he is from [facility] and plan to return. Offered to contact family/friends with updates and pt declines at this time. CSW explained that depending on when he was medically stable we could either dc him to dialysis and then to [facility] or if it was a non dialysis day that we can send him back straight to SNF. He states understanding. TOC team available as needed to facilitate d/c. Pt will need new COVID swab within 72 hrs prior to discharge.   Expected Discharge Plan: [place] Barriers to Discharge: Continued Medical Work up   Patient Goals and CMS Choice Patient states their goals for this hospitalization and ongoing recovery are:: return to [email or web address].gov Compare Post Acute Care list provided to:: (LTC resident) Choice offered to / list presented to : Patient  Expected Discharge Plan and Services Expected Discharge Plan: [place] In-house Referral: Clinical Social Work Discharge Planning Services: CM Consult Post Acute Care Choice: [place], Resumption of Svcs/PTA Provider Living arrangements for the past 2 months: [place]  Prior Living Arrangements/Services Living arrangements for the past 2 months: [place] Lives with:: Facility Resident Patient language and need for interpreter reviewed:: Yes(no needs) Do you feel safe going back to the place where you live?: Yes      Need for Family Participation in Patient Care: Yes (Comment) Care giver support system in place?: Yes (comment)   Criminal Activity/Legal  Involvement Pertinent to Current Situation/Hospitalization: No - Comment as needed  Activities of Daily Living Home Assistive Devices/Equipment: Other (Comment)(hoyer lift) ADL Screening (condition at time of admission) Patient's cognitive ability adequate to safely complete daily activities?: Yes Is the patient deaf or have difficulty hearing?: No Does the patient have difficulty seeing, even when wearing glasses/contacts?: No Does the patient have difficulty concentrating, remembering, or making decisions?: No Patient able to express need for assistance with ADLs?: Yes Does the patient have difficulty dressing or bathing?: Yes Independently performs ADLs?: No Communication: Independent Dressing (OT): Needs assistance Is this a change from baseline?: Pre-admission baseline Grooming: Needs assistance Is this a change from baseline?: Pre-admission baseline Feeding: Independent Bathing: Needs assistance Is this a change from baseline?: Pre-admission baseline Toileting: Needs assistance Is this a change from baseline?: Pre-admission baseline In/Out Bed: Dependent Is this a change from baseline?: Pre-admission baseline Walks in Home: Dependent Is this a change from baseline?: Pre-admission baseline Does the patient have difficulty walking or climbing stairs?: Yes Weakness of Legs: Both Weakness of Arms/Hands: None  Permission Sought/Granted Permission sought to share information with : Family Supports Permission granted to share information with : No, Yes, Verbal Permission Granted(pt declined family contact, okay with SNF contact)     Permission granted to share info w AGENCY: [facility]    Emotional Assessment Appearance:: Appears stated age Attitude/Demeanor/Rapport: Lethargic Affect (typically observed): Accepting, Flat Orientation: : Oriented to Self, Oriented to Place, Oriented to  Time, Oriented to Situation Alcohol / Substance Use: Not Applicable Psych Involvement:  No (comment)(n/a at this time)  Admission diagnosis:  Hypokalemia [E87.6] Hyperammonemia (HCC) [E72.20] Retroperitoneal abscess ([place]) [K68.19] Sepsis without acute organ dysfunction, due to unspecified organism [facility]) [A41.9] Patient Active Problem List   Diagnosis Date Noted  . Retroperitoneal abscess ([place]) 03/13/2019  . Infection due to ESBL-producing Klebsiella pneumoniae 03/13/2019  . Bacteremia due to Klebsiella pneumoniae 06/19/2018  . Fever 06/18/2018  . ESRD (end stage renal disease) on dialysis ([place]) 06/18/2018  . Anemia due to chronic kidney disease 06/18/2018  . Leukocytosis 06/18/2018  . Acute metabolic encephalopathy 82/51/8984  . Seizures ([place]) 05/24/2018  . BPH (benign prostatic hyperplasia) 05/24/2018  . Sepsis ([place]) 05/24/2018  . Benign essential HTN 05/24/2018  . HLD (hyperlipidemia) 05/24/2018  . Diabetes mellitus type 2, uncontrolled, with complications ([place]) 21/03/1279  . Thrombocytopenia ([place]) 05/24/2018  . Confusion   . Metabolic acidosis 18/86/7737  . Respiratory failure ([place])   . Acute renal failure superimposed on stage 4 chronic kidney disease ([place])   . End stage liver disease ([place])   . Pressure injury of skin 04/21/2018  . Altered mental status, unspecified 04/21/2018  . HCAP (healthcare-associated pneumonia) 04/21/2018  . Acute renal failure (ARF) ([place]) 04/07/2018   PCP:  Practice, [place]:  No Pharmacies Listed  Readmission Risk Interventions Readmission Risk Prevention Plan 03/19/2019 11/29/2018 05/28/2018  Transportation Screening Complete Complete -  PCP or Specialist Appt within 5-7 Days - - -  Home Care Screening - - -  Medication Review (RN CM) - - -  Medication Review (RN Transport planner) Referral to Pharmacy Complete -  PCP or Specialist appointment within 3-5 days of discharge Not Complete Not Complete -  PCP/Specialist Appt Not Complete comments LTC SNF resident pt is SNF resident -  [place] or [place] Not  Complete Not Complete Complete  HRI or Home Care Consult Pt Refusal Comments LTC SNF resident pt is SNF resident -  SW Recovery Care/Counseling Consult Complete Complete Complete  Palliative Care Screening Not Applicable Not Applicable -  Skilled Nursing Facility Complete Complete -  Some recent data might be hidden

## 2019-03-19 NOTE — Progress Notes (Addendum)
PROGRESS NOTE    Eddie Davies    Code Status: Full Code  ATF:[id number] DOB: 09-Jun-1970 DOA: 03/12/2019 LOS: 6 days  PCP: [facility] CC:  Chief Complaint  Patient presents with  . Emesis       Hospital Summary  Eddie Davies is a 49 y.o. male with a history of ESRD on HD, ESBL who was admitted early this morning by Dr. Alcario Drought for retroperitoneal abscess from SNF.  He was recently admitted for left-sided pyelonephritis and sepsis status post left-sided radical nephrectomy and right double-J stent placement at OSH who presented with N/V/fever.  Has since been evaluated by nephrology and patient underwent HD today.   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 for retroperitoneal fluid collection, IV abx with IR drain placement 02/06/19, with improvement on repeat imaging  03/07/2019 R ureteral stent removal.  2/18: Status post CT-guided drain placement x2.  ID consulted. 2/19: Tunneled HD catheter removed via IR 2/22: R IJ TDC placed by IR  A & P   Principal Problem:   Retroperitoneal abscess ([place]) Active Problems:   End stage liver disease ([place])   Seizures ([place])   Sepsis ([place])   Benign essential HTN   Diabetes mellitus type 2, uncontrolled, with complications ([place])   ESRD (end stage renal disease) on dialysis ([place])   Infection due to ESBL-producing Klebsiella pneumoniae    1. Left retroperitoneal Klebsiella pneumonia  abscess with history of ESBL Klebsiella bacteremia  1. 2/18: status post CT-guided drain placement x2 via IR 2. 2/19: Tunneled HD catheter removed via IR for 48 hr line holiday 3. 2/22: R IJ TDC placed 4. Per ID: 2 weeks of Carbapenem therapy from date of HD line removal (2/19->3/4) then switch to cefepime with HD and complete an additional 2 weeks and be sure his abscesses have resolved and drains removed. Will need a separate line for this prior to discharge 5. Repeat CT scan when drain output<10 to 15 mL/day for 2-3 days, currently 65 mL and 55 mL respectively NOTE: Must have discharge instructions in DC Summary to SNF for CT scan if he is discharged prior to getting this study 2. Hypokalemia, resolved 3. 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 ESRD on HD M, W, F  1. HD tomorrow  5. Seizure disorder continue Tegretol and phenobarbital 6. Diabetes -increase Levemir to 10 units daily, continue sliding scale 7. Metabolic bone disease On PhosLo 8. Normocytic Anemia Was given ESA as outpatient 9. Hypertension continue current regimen and volume removal per nephro 10. Chronic nonobstructing right urolithiasis -stable, urology signed off, follow up outpatient 11. Hyperlipidemia -on statin    DVT prophylaxis: Subcu Heparin Family Communication: No family at bedside, does not wish for family to be called Disposition Plan:   Patient came from:   SNF  Anticipated d/c place: SNF  Barriers to d/c:  Likely medically ready for discharge in the next 24-48 hours pending improved drainage, additional IV access for antibiotics. Will need repeat covid testing when medically appropriate for discharge.    Pressure injury documentation   Pressure Injury 04/21/18 Stage II -  Partial thickness loss of dermis presenting as a shallow open ulcer with a red, pink wound bed without slough. redness, possible previous injury (Active)    04/21/18 2200  Location: Coccyx  Location Orientation: Medial  Staging: Stage II -  Partial thickness loss of dermis presenting as a shallow open ulcer with a red, pink wound bed without slough.  Wound Description (Comments): redness, possible previous injury  Present on Admission: Yes     Consultants  ID urology Nephrology IR  Procedures  2/17 HD, on MWF schedule 2/18 CT-guided drain placement x2 2/19 HD catheter removal 2/22 R IJ tunneled dialysis catheter placed by IR  Antibiotics   Anti-infectives (From admission, onward)   Start     Dose/Rate Route Frequency Ordered Stop   03/18/19 1015  ceFAZolin (ANCEF) IVPB 2g/100 mL premix  Status:  Discontinued     2 g 200 mL/hr over 30 Minutes Intravenous  Once 03/18/19 1006 03/18/19 1128   03/17/19 2200  meropenem (MERREM) 500 mg in sodium chloride 0.9 % 100 mL IVPB     500 mg 200 mL/hr over 30 Minutes Intravenous Every 24 hours 03/17/19 1608     03/13/19 2200  meropenem (MERREM) 500 mg in sodium chloride 0.9 % 100 mL IVPB  Status:  Discontinued     500 mg 200 mL/hr over 30 Minutes Intravenous Every 24 hours 03/12/19 2210 03/17/19 1550   03/12/19 2215  piperacillin-tazobactam (ZOSYN) IVPB 3.375 g  Status:  Discontinued     3.375 g 12.5 mL/hr over 240 Minutes Intravenous  Once 03/12/19 2205 03/12/19 2208   03/12/19 2215  meropenem (MERREM) 1 g in sodium chloride 0.9 % 100 mL IVPB     1 g 200 mL/hr over 30 Minutes Intravenous  Once 03/12/19 2210 03/13/19 0223        Subjective   Patient seen and examined at bedside no acute distress and resting comfortably.  No events overnight.  Tolerating diet.  Denies any chest pain, shortness of breath, fever, nausea, vomiting, urinary complaints.  Otherwise ROS negative   Objective   Vitals:   03/18/19 1650 03/18/19 1751 03/18/19 2058 03/19/19 0429  BP: 111/64 127/72 116/68 122/78  Pulse: 83 79 82 77  Resp: _0 Temp: 98.1 F (36.7 C) 98 F (36.7 C) 98.4 F (36.9  C) 98 F (36.7 C)  TempSrc: Oral Oral Oral Oral  SpO2: 98% 100% 100% 100%  Weight: 90.2 kg     Height:        Intake/Output Summary (Last 24 hours) at 03/19/2019 1508 Last data filed at 03/19/2019 0925 Gross per 24 hour  Intake 680 ml  Output 1997 ml  Net -1317 ml   Filed Weights   03/15/19 1330 03/18/19 1315 03/18/19 1650  Weight: 88.2 kg 91 kg 90.2 kg    Examination:  Physical Exam Vitals and nursing note reviewed.  Constitutional:      Comments: Chronically ill appearing  HENT:     Head: Normocephalic and atraumatic.  Eyes:     Conjunctiva/sclera: Conjunctivae normal.  Cardiovascular:     Rate and Rhythm: Normal rate and regular rhythm.  Pulmonary:     Effort: Pulmonary effort  is normal.     Breath sounds: Normal breath sounds.  Abdominal:     General: Abdomen is flat.     Palpations: Abdomen is soft.  Musculoskeletal:        General: No swelling or tenderness.  Skin:    General: Skin is dry.     Coloration: Skin is not jaundiced or pale.  Neurological:     Mental Status: He is alert. Mental status is at baseline.  Psychiatric:        Mood and Affect: Mood normal.        Behavior: Behavior normal.     Data Reviewed: I have personally reviewed following labs and imaging studies  CBC: Recent Labs  Lab 03/12/19 2331 03/13/19 0423 03/14/19 0154 03/15/19 0241 03/16/19 0135 03/18/19 0559 03/19/19 0122  WBC 8.5   < > 9.1 6.0 5.3 3.7* 4.5  NEUTROABS 7.0  --   --   --   --  2.3  --   HGB 8.3*   < > 8.4* 8.0* 8.0* 8.0* 8.1*  HCT 25.9*   < > 25.6* 25.1* 25.2* 25.2* 25.3*  MCV 94.2   < > 92.4 95.4 94.0 95.8 93.7  PLT 228   < > 200 198 184 133* 154   < > = values in this interval not displayed.   Basic Metabolic Panel: Recent Labs  Lab 03/12/19 2331 03/13/19 0423 03/13/19 1230 03/14/19 0154 03/15/19 0241 03/15/19 0241 03/16/19 0135 03/16/19 1443 03/17/19 0259 03/18/19 0559 03/19/19 0122  NA  --    < > 136   < > 136   < > 137 136 137 137 138   K  --    < > 3.2*   < > 3.4*   < > 2.7* 3.0* 3.0* 3.8 3.5  CL  --    < > 103   < > 101   < > 101 101 102 103 105  CO2  --    < > 21*   < > 23   < > _0 21* 25  GLUCOSE  --    < > 102*   < > 275*   < > 188* 293* 250* 163* 154*  BUN  --    < > 43*   < > 27*   < > 16 22* 33* 47* 25*  CREATININE  --    < > 5.91*   < > 4.49*   < > 2.78* 3.49* 4.01* 4.68* 2.80*  CALCIUM  --    < > 7.8*   < > 7.7*   < > 7.7* 7.7* 7.7* 8.0* 7.5*  MG 1.8  --   --   --   --   --   --   --   --   --   --   PHOS  --   --  5.6*  --  4.0  --  2.0*  --  3.6  --   --    < > = values in this interval not displayed.   GFR: Estimated Creatinine Clearance: 37.5 mL/min (A) (by C-G formula based on SCr of 2.8 mg/dL (H)). Liver Function Tests: Recent Labs  Lab 03/12/19 2051 03/12/19 2051 03/13/19 0423 03/13/19 1230 03/15/19 0241 03/16/19 0135 03/17/19 0259  AST 10*  --  10*  --   --   --   --   ALT 8  --  7  --   --   --   --  ALKPHOS 124  --  108  --   --   --   --   BILITOT 0.8  --  0.4  --   --   --   --   PROT 6.6  --  5.6*  --   --   --   --   ALBUMIN 1.8*   < > 1.5* 1.5* 1.5* 1.6* 1.5*   < > = values in this interval not displayed.   Recent Labs  Lab 03/12/19 2051  LIPASE 13   Recent Labs  Lab 03/12/19 2051  AMMONIA 61*   Coagulation Profile: Recent Labs  Lab 03/12/19 2331 03/18/19 0559  INR 1.7* 1.2   Cardiac Enzymes: No results for input(s): CKTOTAL, CKMB, CKMBINDEX, TROPONINI in the last 168 hours. BNP (last 3 results) No results for input(s): PROBNP in the last 8760 hours. HbA1C: No results for input(s): HGBA1C in the last 72 hours. CBG: Recent Labs  Lab 03/18/19 2327 03/19/19 0426 03/19/19 0646 03/19/19 0755 03/19/19 1200  GLUCAP 224* 85 103* 136* 243*   Lipid Profile: No results for input(s): CHOL, HDL, LDLCALC, TRIG, CHOLHDL, LDLDIRECT in the last 72 hours. Thyroid Function Tests: No results for input(s): TSH, T4TOTAL, FREET4, T3FREE, THYROIDAB in the last 72  hours. Anemia Panel: No results for input(s): VITAMINB12, FOLATE, FERRITIN, TIBC, IRON, RETICCTPCT in the last 72 hours. Sepsis Labs: Recent Labs  Lab 03/12/19 2051 03/13/19 1139  LATICACIDVEN 0.8 0.8    Recent Results (from the past 240 hour(s))  Blood Culture (routine x 2)     Status: None   Collection Time: 03/12/19  9:21 PM   Specimen: BLOOD  Result Value Ref Range Status   Specimen Description BLOOD RIGHT ANTECUBITAL  Final   Special Requests   Final    BOTTLES DRAWN AEROBIC AND ANAEROBIC Blood Culture adequate volume   Culture   Final    NO GROWTH 5 DAYS Performed at [facility], [address] [place], [place] [address]    Report Status 03/17/2019 FINAL  Final  Blood Culture (routine x 2)     Status: None   Collection Time: 03/12/19  9:21 PM   Specimen: BLOOD RIGHT HAND  Result Value Ref Range Status   Specimen Description BLOOD RIGHT HAND  Final   Special Requests   Final    BOTTLES DRAWN AEROBIC AND ANAEROBIC Blood Culture adequate volume   Culture   Final    NO GROWTH 5 DAYS Performed at [facility], [place] [address] [place], [place] [address]    Report Status 03/17/2019 FINAL  Final  Urine culture     Status: Abnormal   Collection Time: 03/13/19  7:24 AM   Specimen: In/Out Cath Urine  Result Value Ref Range Status   Specimen Description IN/OUT CATH URINE  Final   Special Requests   Final    NONE Performed at [facility], [place] [address], [place],  [address]    Culture MULTIPLE SPECIES PRESENT, SUGGEST RECOLLECTION (A)  Final   Report Status 03/14/2019 FINAL  Final  Aerobic/Anaerobic Culture (surgical/deep wound)     Status: None   Collection Time: 03/14/19 11:01 AM   Specimen: Abscess  Result Value Ref Range Status   Specimen Description ABSCESS  Final   Special Requests NONE  Final   Gram Stain   Final    ABUNDANT WBC PRESENT, PREDOMINANTLY PMN NO ORGANISMS SEEN    Culture   Final    MODERATE  KLEBSIELLA PNEUMONIAE NO  ANAEROBES ISOLATED Performed at [facility], [place] [address] [place], [place] [address]    Report Status 03/19/2019 FINAL  Final   Organism ID, Bacteria KLEBSIELLA PNEUMONIAE  Final      Susceptibility   Klebsiella pneumoniae - MIC*    AMPICILLIN >=32 RESISTANT Resistant     CEFAZOLIN 16 SENSITIVE Sensitive     CEFEPIME <=0.12 SENSITIVE Sensitive     CEFTAZIDIME <=1 SENSITIVE Sensitive     CEFTRIAXONE <=0.25 SENSITIVE Sensitive     CIPROFLOXACIN 1 SENSITIVE Sensitive     GENTAMICIN 8 INTERMEDIATE Intermediate     IMIPENEM 1 SENSITIVE Sensitive     TRIMETH/SULFA >=320 RESISTANT Resistant     AMPICILLIN/SULBACTAM >=32 RESISTANT Resistant     PIP/TAZO 64 INTERMEDIATE Intermediate     * MODERATE KLEBSIELLA PNEUMONIAE         Radiology Studies: IR Fluoro Guide CV Line Right  Result Date: 03/18/2019 INDICATION: 49 year old male with a history of renal failure, line holiday EXAM: TUNNELED CENTRAL VENOUS HEMODIALYSIS CATHETER PLACEMENT WITH ULTRASOUND AND FLUOROSCOPIC GUIDANCE MEDICATIONS: 2 g Ancef. The antibiotic was given in an appropriate time interval prior to skin puncture. ANESTHESIA/SEDATION: Moderate (conscious) sedation was employed during this procedure. A total of Versed 0.5 mg and Fentanyl 12.5 mcg was administered intravenously. Moderate Sedation Time: 15 minutes. The patient's level of consciousness and vital signs were monitored continuously by radiology nursing throughout the procedure under my direct supervision. FLUOROSCOPY TIME:  Fluoroscopy Time: 1 minutes 0 seconds (7 mGy). COMPLICATIONS: None PROCEDURE: Informed written consent was obtained from the patient after a discussion of the risks, benefits, and alternatives to treatment. Questions regarding the procedure were encouraged and answered. The right neck and chest were prepped with chlorhexidine in a sterile fashion, and a sterile drape was applied covering the operative field. Maximum barrier sterile technique  with sterile gowns and gloves were used for the procedure. A timeout was performed prior to the initiation of the procedure. After creating a small venotomy incision, a micropuncture kit was utilized to access the right internal jugular vein under direct, real-time ultrasound guidance after the overlying soft tissues were anesthetized with 1% lidocaine with epinephrine. Ultrasound image documentation was performed. The microwire was marked to measure appropriate internal catheter length. External tunneled length was estimated. A total tip to cuff length of 19 cm was selected. Skin and subcutaneous tissues of chest wall below the clavicle were generously infiltrated with 1% lidocaine for local anesthesia. A small stab incision was made with 11 blade scalpel. The selected hemodialysis catheter was tunneled in a retrograde fashion from the anterior chest wall to the venotomy incision. A guidewire was advanced to the level of the IVC and the micropuncture sheath was exchanged for a peel-away sheath. The catheter was then placed through the peel-away sheath with tips ultimately positioned within the superior aspect of the right atrium. Final catheter positioning was confirmed and documented with a spot radiographic image. The catheter aspirates and flushes normally. The catheter was flushed with appropriate volume heparin dwells. The catheter exit site was secured with a 0-Prolene retention suture. The venotomy incision was closed Derma bond and sterile dressing. Dressings were applied at the chest wall. Patient tolerated the procedure well and remained hemodynamically stable throughout. No complications were encountered and no significant blood loss encountered. IMPRESSION: Status post right IJ tunneled hemodialysis catheter. Catheter ready for use. Signed, Dulcy Fanny. Dellia Nims, [place] [facility] Radiology Electronically Signed  By: Corrie Mckusick D.O.   On: 03/18/2019  12:28   IR US Guide Vasc Access Right  Result Date: 03/18/2019 INDICATION: 49 year old male with a history of renal failure, line holiday EXAM: TUNNELED CENTRAL VENOUS HEMODIALYSIS CATHETER PLACEMENT WITH ULTRASOUND AND FLUOROSCOPIC GUIDANCE MEDICATIONS: 2 g Ancef. The antibiotic was given in an appropriate time interval prior to skin puncture. ANESTHESIA/SEDATION: Moderate (conscious) sedation was employed during this procedure. A total of Versed 0.5 mg and Fentanyl 12.5 mcg was administered intravenously. Moderate Sedation Time: 15 minutes. The patient's level of consciousness and vital signs were monitored continuously by radiology nursing throughout the procedure under my direct supervision. FLUOROSCOPY TIME:  Fluoroscopy Time: 1 minutes 0 seconds (7 mGy). COMPLICATIONS: None PROCEDURE: Informed written consent was obtained from the patient after a discussion of the risks, benefits, and alternatives to treatment. Questions regarding the procedure were encouraged and answered. The right neck and chest were prepped with chlorhexidine in a sterile fashion, and a sterile drape was applied covering the operative field. Maximum barrier sterile technique with sterile gowns and gloves were used for the procedure. A timeout was performed prior to the initiation of the procedure. After creating a small venotomy incision, a micropuncture kit was utilized to access the right internal jugular vein under direct, real-time ultrasound guidance after the overlying soft tissues were anesthetized with 1% lidocaine with epinephrine. Ultrasound image documentation was performed. The microwire was marked to measure appropriate internal catheter length. External tunneled length was estimated. A total tip to cuff length of 19 cm was selected. Skin and subcutaneous tissues of chest wall below the clavicle were generously infiltrated with 1% lidocaine for local anesthesia. A small stab incision was made with 11 blade scalpel. The  selected hemodialysis catheter was tunneled in a retrograde fashion from the anterior chest wall to the venotomy incision. A guidewire was advanced to the level of the IVC and the micropuncture sheath was exchanged for a peel-away sheath. The catheter was then placed through the peel-away sheath with tips ultimately positioned within the superior aspect of the right atrium. Final catheter positioning was confirmed and documented with a spot radiographic image. The catheter aspirates and flushes normally. The catheter was flushed with appropriate volume heparin dwells. The catheter exit site was secured with a 0-Prolene retention suture. The venotomy incision was closed Derma bond and sterile dressing. Dressings were applied at the chest wall. Patient tolerated the procedure well and remained hemodynamically stable throughout. No complications were encountered and no significant blood loss encountered. IMPRESSION: Status post right IJ tunneled hemodialysis catheter. Catheter ready for use. Signed, Dulcy Fanny. Dellia Nims, RPVI [facility] Radiology Electronically Signed   By: Corrie Mckusick D.O.   On: 03/18/2019 12:28        Scheduled Meds: . allopurinol  100 mg Oral Daily  . carbamazepine  900 mg Oral BID  . Chlorhexidine Gluconate Cloth  6 each Topical Q0600  . feeding supplement (NEPRO CARB STEADY)  237 mL Oral TID BM  . ferrous sulfate  325 mg Oral Q breakfast  . FLUoxetine  20 mg Oral Daily  . insulin aspart  0-9 Units Subcutaneous Q4H  . insulin detemir  8 Units Subcutaneous Daily  . lactulose  20 g Oral BID  . multivitamin with minerals  1 tablet Oral QHS  . pantoprazole  40 mg Oral Daily  . PHENobarbital  243 mg Oral QHS  . simvastatin  20 mg Oral QHS  . sodium chloride flush  5 mL Intracatheter Q8H  . tamsulosin  0.4 mg Oral QHS   Continuous Infusions: . meropenem (MERREM) IV 500 mg (03/18/19 2136)     Time spent: 15 minutes with over  50% of the time coordinating the patient's care    Harold Hedge, DO [facility] Pager [phone]  Call night coverage person covering after 7pm

## 2019-03-20 LAB — BASIC METABOLIC PANEL
Anion gap: 10 (ref 5–15)
BUN: 41 mg/dL — ABNORMAL HIGH (ref 6–20)
CO2: 21 mmol/L — ABNORMAL LOW (ref 22–32)
Calcium: 7.6 mg/dL — ABNORMAL LOW (ref 8.9–10.3)
Chloride: 104 mmol/L (ref 98–111)
Creatinine, Ser: 3.89 mg/dL — ABNORMAL HIGH (ref 0.61–1.24)
GFR calc Af Amer: 20 mL/min — ABNORMAL LOW (ref >60)
GFR calc non Af Amer: 17 mL/min — ABNORMAL LOW (ref >60)
Glucose, Bld: 283 mg/dL — ABNORMAL HIGH (ref 70–99)
Potassium: 3.7 mmol/L (ref 3.5–5.1)
Sodium: 135 mmol/L (ref 135–145)

## 2019-03-20 LAB — GLUCOSE, CAPILLARY
Glucose-Capillary: 152 mg/dL — ABNORMAL HIGH (ref 70–99)
Glucose-Capillary: 154 mg/dL — ABNORMAL HIGH (ref 70–99)
Glucose-Capillary: 191 mg/dL — ABNORMAL HIGH (ref 70–99)
Glucose-Capillary: 242 mg/dL — ABNORMAL HIGH (ref 70–99)
Glucose-Capillary: 342 mg/dL — ABNORMAL HIGH (ref 70–99)
Glucose-Capillary: 410 mg/dL — ABNORMAL HIGH (ref 70–99)

## 2019-03-20 LAB — CBC
HCT: 26.4 % — ABNORMAL LOW (ref 39.0–52.0)
Hemoglobin: 8.4 g/dL — ABNORMAL LOW (ref 13.0–17.0)
MCH: 30 pg (ref 26.0–34.0)
MCHC: 31.8 g/dL (ref 30.0–36.0)
MCV: 94.3 fL (ref 80.0–100.0)
Platelets: 149 10*3/uL — ABNORMAL LOW (ref 150–400)
RBC: 2.8 MIL/uL — ABNORMAL LOW (ref 4.22–5.81)
RDW: 16.3 % — ABNORMAL HIGH (ref 11.5–15.5)
WBC: 4.1 10*3/uL (ref 4.0–10.5)
nRBC: 0 % (ref 0.0–0.2)

## 2019-03-20 MED ORDER — INSULIN ASPART 100 UNIT/ML ~~LOC~~ SOLN
0.0000 [IU] | Freq: Three times a day (TID) | SUBCUTANEOUS | Status: DC
  Administered 2019-03-21: 18:00:00 1 [IU] via SUBCUTANEOUS
  Administered 2019-03-21 – 2019-03-22 (×2): 2 [IU] via SUBCUTANEOUS
  Administered 2019-03-22: 7 [IU] via SUBCUTANEOUS
  Administered 2019-03-23: 3 [IU] via SUBCUTANEOUS
  Administered 2019-03-23: 18:00:00 1 [IU] via SUBCUTANEOUS

## 2019-03-20 MED ORDER — INSULIN ASPART 100 UNIT/ML ~~LOC~~ SOLN
2.0000 [IU] | Freq: Three times a day (TID) | SUBCUTANEOUS | Status: DC
  Administered 2019-03-21 – 2019-03-23 (×7): 2 [IU] via SUBCUTANEOUS

## 2019-03-20 MED ORDER — INSULIN ASPART 100 UNIT/ML ~~LOC~~ SOLN
0.0000 [IU] | Freq: Every day | SUBCUTANEOUS | Status: DC
  Administered 2019-03-20: 2 [IU] via SUBCUTANEOUS

## 2019-03-20 MED ORDER — INSULIN ASPART 100 UNIT/ML ~~LOC~~ SOLN
2.0000 [IU] | Freq: Once | SUBCUTANEOUS | Status: AC
  Administered 2019-03-20: 2 [IU] via SUBCUTANEOUS

## 2019-03-20 MED ORDER — HEPARIN SODIUM (PORCINE) 1000 UNIT/ML IJ SOLN
INTRAMUSCULAR | Status: AC
  Administered 2019-03-20: 4000 [IU]
  Filled 2019-03-20: qty 4

## 2019-03-20 NOTE — Progress Notes (Signed)
Inpatient Diabetes Program Recommendations  AACE/ADA: New Consensus Statement on Inpatient Glycemic Control (2015)  Target Ranges:  Prepandial:   less than 140 mg/dL      Peak postprandial:   less than 180 mg/dL (1-2 hours)      Critically ill patients:  140 - 180 mg/dL   Lab Results  Component Value Date   GLUCAP 342 (H) 03/20/2019   HGBA1C 6.5 (H) 03/12/2019    Review of Glycemic Control  Results for DAMYEN, KNOLL (MRN [id number]) as of 03/20/2019 11:06  Ref. Range 03/19/2019 07:55 03/19/2019 12:00 03/19/2019 16:01 03/19/2019 20:04 03/19/2019 23:52 03/20/2019 03:54  Glucose-Capillary Latest Ref Range: 70 - 99 mg/dL 136 (H) 243 (H) 238 (H) 157 (H) 258 (H) 342 (H)   Diabetes history: DM 2 Outpatient Diabetes medications:  Basaglar 20 units q HS, Novolog 0-15 units qid Current orders for Inpatient glycemic control:  Levemir 10 units daily, Novolog sensitive q 4 hours  Inpatient Diabetes Program Recommendations:    Consider changing Novolog correction to tid with meals and HS.  Also consider adding Novolog meal coverage 2 units tid with meals (hold if patient eats less than 50%).   Thanks  Adah Perl, RN, BC-ADM Inpatient Diabetes Coordinator Pager [phone] (8a-5p)

## 2019-03-20 NOTE — Progress Notes (Signed)
[facility] Progress Note   Subjective:  Seen on HD - 1.5L net UFG and tolerating. C/o L flank pain - no CP or dyspnea.  Objective Vitals:   03/20/19 0730 03/20/19 0800 03/20/19 0830 03/20/19 0900  BP: (!) 142/82 123/74 126/76 128/72  Pulse: 66 66 74 75  Resp: 16 17 16 16   Temp:      TempSrc:      SpO2: 99% 99%    Weight:      Height:       Physical Exam General:Chronically ill appearing man, NAD. Diffuse dry skin Heart:RRR; no murmur Lungs:CTAB Abdomen:soft, non-tender. Two JP drains to L flank - serosang output present Extremities:No LE edema Dialysis Access:TDC in R chest, maturing RUE AVF + thrill  Additional Objective Labs: Basic Metabolic Panel: Recent Labs  Lab 03/15/19 0241 03/15/19 0241 03/16/19 0135 03/16/19 1443 03/17/19 0259 03/17/19 0259 03/18/19 0559 03/19/19 0122 03/20/19 0152  NA 136   < > 137   < > 137   < > 137 138 135  K 3.4*   < > 2.7*   < > 3.0*   < > 3.8 3.5 3.7  CL 101   < > 101   < > 102   < > 103 105 104  CO2 23   < > 25   < > 25   < > 21* 25 21*  GLUCOSE 275*   < > 188*   < > 250*   < > 163* 154* 283*  BUN 27*   < > 16   < > 33*   < > 47* 25* 41*  CREATININE 4.49*   < > 2.78*   < > 4.01*   < > 4.68* 2.80* 3.89*  CALCIUM 7.7*   < > 7.7*   < > 7.7*   < > 8.0* 7.5* 7.6*  PHOS 4.0  --  2.0*  --  3.6  --   --   --   --    < > = values in this interval not displayed.   Liver Function Tests: Recent Labs  Lab 03/15/19 0241 03/16/19 0135 03/17/19 0259  ALBUMIN 1.5* 1.6* 1.5*   CBC: Recent Labs  Lab 03/15/19 0241 03/15/19 0241 03/16/19 0135 03/16/19 0135 03/18/19 0559 03/19/19 0122 03/20/19 0152  WBC 6.0   < > 5.3   < > 3.7* 4.5 4.1  NEUTROABS  --   --   --   --  2.3  --   --   HGB 8.0*   < > 8.0*   < > 8.0* 8.1* 8.4*  HCT 25.1*   < > 25.2*   < > 25.2* 25.3* 26.4*  MCV 95.4  --  94.0  --  95.8 93.7 94.3  PLT 198   < > 184   < > 133* 154 149*   < > = values in this interval not displayed.   Blood  Culture    Component Value Date/Time   SDES ABSCESS 03/14/2019 1101   SPECREQUEST NONE 03/14/2019 1101   CULT  03/14/2019 1101    MODERATE KLEBSIELLA PNEUMONIAE NO ANAEROBES ISOLATED Performed at [facility], [place] [address], [place], [place] [address]    REPTSTATUS 03/19/2019 FINAL 03/14/2019 1101   CBG: Recent Labs  Lab 03/19/19 1200 03/19/19 1601 03/19/19 2004 03/19/19 2352 03/20/19 0354  GLUCAP 243* 238* 157* 258* 342*   Studies/Results: IR Fluoro Guide CV Line Right  Result Date: 03/18/2019 INDICATION: 49 year old male with a  history of renal failure, line holiday EXAM: TUNNELED CENTRAL VENOUS HEMODIALYSIS CATHETER PLACEMENT WITH ULTRASOUND AND FLUOROSCOPIC GUIDANCE MEDICATIONS: 2 g Ancef. The antibiotic was given in an appropriate time interval prior to skin puncture. ANESTHESIA/SEDATION: Moderate (conscious) sedation was employed during this procedure. A total of Versed 0.5 mg and Fentanyl 12.5 mcg was administered intravenously. Moderate Sedation Time: 15 minutes. The patient's level of consciousness and vital signs were monitored continuously by radiology nursing throughout the procedure under my direct supervision. FLUOROSCOPY TIME:  Fluoroscopy Time: 1 minutes 0 seconds (7 mGy). COMPLICATIONS: None PROCEDURE: Informed written consent was obtained from the patient after a discussion of the risks, benefits, and alternatives to treatment. Questions regarding the procedure were encouraged and answered. The right neck and chest were prepped with chlorhexidine in a sterile fashion, and a sterile drape was applied covering the operative field. Maximum barrier sterile technique with sterile gowns and gloves were used for the procedure. A timeout was performed prior to the initiation of the procedure. After creating a small venotomy incision, a micropuncture kit was utilized to access the right internal jugular vein under direct, real-time ultrasound guidance after the overlying soft  tissues were anesthetized with 1% lidocaine with epinephrine. Ultrasound image documentation was performed. The microwire was marked to measure appropriate internal catheter length. External tunneled length was estimated. A total tip to cuff length of 19 cm was selected. Skin and subcutaneous tissues of chest wall below the clavicle were generously infiltrated with 1% lidocaine for local anesthesia. A small stab incision was made with 11 blade scalpel. The selected hemodialysis catheter was tunneled in a retrograde fashion from the anterior chest wall to the venotomy incision. A guidewire was advanced to the level of the IVC and the micropuncture sheath was exchanged for a peel-away sheath. The catheter was then placed through the peel-away sheath with tips ultimately positioned within the superior aspect of the right atrium. Final catheter positioning was confirmed and documented with a spot radiographic image. The catheter aspirates and flushes normally. The catheter was flushed with appropriate volume heparin dwells. The catheter exit site was secured with a 0-Prolene retention suture. The venotomy incision was closed Derma bond and sterile dressing. Dressings were applied at the chest wall. Patient tolerated the procedure well and remained hemodynamically stable throughout. No complications were encountered and no significant blood loss encountered. IMPRESSION: Status post right IJ tunneled hemodialysis catheter. Catheter ready for use. Signed, Dulcy Fanny. Dellia Nims, RPVI [facility] Electronically Signed   By: Corrie Mckusick D.O.   On: 03/18/2019 12:28   IR US Guide Vasc Access Right  Result Date: 03/18/2019 INDICATION: 49 year old male with a history of renal failure, line holiday EXAM: TUNNELED CENTRAL VENOUS HEMODIALYSIS CATHETER PLACEMENT WITH ULTRASOUND AND FLUOROSCOPIC GUIDANCE MEDICATIONS: 2 g Ancef. The antibiotic was given in an appropriate  time interval prior to skin puncture. ANESTHESIA/SEDATION: Moderate (conscious) sedation was employed during this procedure. A total of Versed 0.5 mg and Fentanyl 12.5 mcg was administered intravenously. Moderate Sedation Time: 15 minutes. The patient's level of consciousness and vital signs were monitored continuously by radiology nursing throughout the procedure under my direct supervision. FLUOROSCOPY TIME:  Fluoroscopy Time: 1 minutes 0 seconds (7 mGy). COMPLICATIONS: None PROCEDURE: Informed written consent was obtained from the patient after a discussion of the risks, benefits, and alternatives to treatment. Questions regarding the procedure were encouraged and answered. The right neck and chest were prepped with chlorhexidine in a sterile fashion, and a sterile  drape was applied covering the operative field. Maximum barrier sterile technique with sterile gowns and gloves were used for the procedure. A timeout was performed prior to the initiation of the procedure. After creating a small venotomy incision, a micropuncture kit was utilized to access the right internal jugular vein under direct, real-time ultrasound guidance after the overlying soft tissues were anesthetized with 1% lidocaine with epinephrine. Ultrasound image documentation was performed. The microwire was marked to measure appropriate internal catheter length. External tunneled length was estimated. A total tip to cuff length of 19 cm was selected. Skin and subcutaneous tissues of chest wall below the clavicle were generously infiltrated with 1% lidocaine for local anesthesia. A small stab incision was made with 11 blade scalpel. The selected hemodialysis catheter was tunneled in a retrograde fashion from the anterior chest wall to the venotomy incision. A guidewire was advanced to the level of the IVC and the micropuncture sheath was exchanged for a peel-away sheath. The catheter was then placed through the peel-away sheath with tips  ultimately positioned within the superior aspect of the right atrium. Final catheter positioning was confirmed and documented with a spot radiographic image. The catheter aspirates and flushes normally. The catheter was flushed with appropriate volume heparin dwells. The catheter exit site was secured with a 0-Prolene retention suture. The venotomy incision was closed Derma bond and sterile dressing. Dressings were applied at the chest wall. Patient tolerated the procedure well and remained hemodynamically stable throughout. No complications were encountered and no significant blood loss encountered. IMPRESSION: Status post right IJ tunneled hemodialysis catheter. Catheter ready for use. Signed, Dulcy Fanny. Dellia Nims, RPVI [facility] Electronically Signed   By: Corrie Mckusick D.O.   On: 03/18/2019 12:28   Medications: . meropenem (MERREM) IV 500 mg (03/19/19 2212)   . allopurinol  100 mg Oral Daily  . carbamazepine  900 mg Oral BID  . Chlorhexidine Gluconate Cloth  6 each Topical Q0600  . feeding supplement (NEPRO CARB STEADY)  237 mL Oral TID BM  . ferrous sulfate  325 mg Oral Q breakfast  . FLUoxetine  20 mg Oral Daily  . insulin aspart  0-9 Units Subcutaneous Q4H  . insulin detemir  10 Units Subcutaneous Daily  . lactulose  20 g Oral BID  . multivitamin with minerals  1 tablet Oral QHS  . pantoprazole  40 mg Oral Daily  . PHENobarbital  243 mg Oral QHS  . simvastatin  20 mg Oral QHS  . sodium chloride flush  5 mL Intracatheter Q8H  . tamsulosin  0.4 mg Oral QHS    Dialysis Orders: MWF at [facility] 4:15hr, 400/800, EDW 94kg, 3K/2.25Ca, TDC, heparin 3000 bolus - Mircera 114mg IV q 2 weeks (2/10)  Assessment/Plan: 1. 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 Urology consulted - not felt to be candidate for open wash-out,S/pIR drain and Meropenem. IDsaw and recommend removalTDC  for line holiday - done. New TDC placement this AM. Per ID - 2 more weeks of Mero then Cefepime q HD for another 2 weeks. 2. ESRD:Will continue HD on MWF schedule-HD today. 3. Hypokalemia: Improved - using 3-4K bath with HD. 4. Hypertension/volume:BPstable- well under EDW per weights, will get standing weight if able. 5. Anemia:Hgb8.4 - last givenESA as outpt2/10/21.Repeating iron studies. 6. Metabolic bone disease:CorrCa/Phos ok - binders held as of 2/20 - follow. 7. T2DM: Per primary. 8. R kidney stone: Per CT - non-obstructing, asymptomatic 9.  Hx  seizure disorder 10. Liver disease: Lactulose BID.  Veneta Penton, [name] 03/20/2019, 9:18 AM  [facility] Pager: [phone]

## 2019-03-20 NOTE — Plan of Care (Signed)
Pt alert and oriented x4 able to express needs c/o 10/10 pain to left flank and buttock iv pain med given. Appetite adequate on carb modified renal diet. Able to turn in bed. 2 JP drains present and draining bright red blood. Remains on contact precautions for ESBL. Went to dialysis today removed 1.5L of fluid today dressing to left calf was changed by dialysis nurse.  Demonstrates proper use of call belll, bed low skid resistant socks presently on patient. Plan to d/c back to [facility] when stable. Will continue to monitor

## 2019-03-20 NOTE — Progress Notes (Signed)
Pharmacy Antibiotic Note  Agustine Rossitto is a 49 y.o. male admitted on 03/12/2019 now with L retropeitoneal abscess.  Pharmacy has been consulted for Merrem dosing.  ID: Sepsis/UTI:  hx of ESBL Kleb pneumo in UCx 11/28/18 - sens to unasyn/merrem//zosyn, however with concern for sepsis and hx of blood stream infections will go with Merrem for now.    -2/18  L retropeitoneal abscess (Recurrent/ongoing from prior hospitalization after nephrectomy)  s/p drain placement by IR -2/19 HD cath removed -2/22 s/p right IJ tunnel HD catheter placed -ID noted on 2/21 Klebsiella in abcess that is NOT ESBL, ID rec'ds to give 2 weeks of carbapenem from date of HD line removal, then switch to cefepime with HD to complete another 2 weeks of IV abx.  Merrem 2/17 >> (3/5) Cefepime start 3/6 x 2 weeks per ID recs  Plan: Merrem 500 mg IV every 24 hours (stop 3/5)    Height: 6' 2"  (188 cm) Weight: 195 lb 5.2 oz (88.6 kg) IBW/kg (Calculated) : 82.2  Temp (24hrs), Avg:97.9 F (36.6 C), Min:97.5 F (36.4 C), Max:98.4 F (36.9 C)  Recent Labs  Lab 03/15/19 0241 03/15/19 0241 03/16/19 0135 03/16/19 0135 03/16/19 1443 03/17/19 0259 03/18/19 0559 03/19/19 0122 03/20/19 0152  WBC 6.0  --  5.3  --   --   --  3.7* 4.5 4.1  CREATININE 4.49*   < > 2.78*   < > 3.49* 4.01* 4.68* 2.80* 3.89*   < > = values in this interval not displayed.    Estimated Creatinine Clearance: 27 mL/min (A) (by C-G formula based on SCr of 3.89 mg/dL (H)).    Allergies  Allergen Reactions  . Fish Oil Swelling and Other (See Comments)         Hassen Bruun S. Alford Highland, PharmD, BCPS Clinical Staff Pharmacist [email or web address]  Wayland Salinas 03/20/2019 12:28 PM

## 2019-03-20 NOTE — Plan of Care (Signed)

## 2019-03-20 NOTE — Progress Notes (Signed)
PROGRESS NOTE    Eddie Davies  FHL:[id number] DOB: November 26, 1970 DOA: 03/12/2019 PCP: [facility]  Brief Narrative:  Eddie Nash Phillipsis a 49 y.o.malewith a history of ESRD on HD, ESBLwho was admitted early this morning by Dr. Nilda Simmer retroperitoneal abscess from SNF.He was recently admitted for left-sided pyelonephritis and sepsis status post left-sided radical nephrectomy and right double-J stent placement at OSH who presented with N/V/fever. Has since been evaluated by nephrology and patient underwent HD and has been dialyzing.   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  2/18: Status post CT-guided drain placement x2.  ID consulted. 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 Will need a separate line for this prior to discharge and will order a Midline if ok with Nephrology  -Repeat CT scan when drain output<10 to 15 mL/day for 2-3 days, currently 65 mL and 55 mL respectively NOTE: Must have discharge instructions in DC Summary to SNF for CT scan if he is discharged prior to getting this study -Interventional radiology evaluated and output continues to be significantly least 20 to 30 mL/day in the drains -Patient is getting close to being discharged soon to SNF and anticipating discharging in the next 24 to 48 hours; Repeat SARS-CoV-2 Test negative  -Continues to have Back Pain   Hypokalemia, resolved and is now 3.7  ESLD on lactulose -Check LFTs in AM   ESRD on HD M, W, F -HD today -Patient's BUN/Cr is now 41/3.89 -Continue to Monitor and Trend Renal Fxn -Repeat CMP In AM    Seizure disorder continue Tegretol and phenobarbital  Diabetes-increase Levemir to 10 units daily, continue sliding scale and adjust; Now change q4h SSI to AC/HS and added 2 units Novolog TIDwm -CBG's ranging from 373-428  Metabolic bone disease On PhosLo  Normocytic Anemia/Anemia of CKD -Was given ESA as outpatient -Patient's Hgb/Hct went from 8.4/26.4 -Continue to Monitor for S/Sx of Bleeding; Currently no Overt Bleeding noted -Repeat CBC in AM   Hypertension continue current regimen and volume removal per nephro  Chronic nonobstructing right urolithiasis -stable, urology signed off, follow up outpatient  Hyperlipidemia -on statin  Thrombocytopenia -Mild as Platelet Count was 149 -Continue to Monitor for S/Sx of Bleeding; Currently no Overt Bleeding noted  -Repeat CBC in AM   DVT prophylaxis: Heparin 5,000 units sq q8h Code Status: FULL CODE  Family Communication: (Specify name, relationship & date discussed. NO "discussed with patient") Disposition Plan: (specify when and where you expect patient to be discharged). Include barriers to DC in this tab.  Consultants:   Nephrology  Urology  Interventional Radiology   Infectious Diseases    Procedures:  2/17 HD, on MWF schedule 2/18 CT-guided drain placement x2 2/19 HD catheter removal 2/22 R IJ tunneled dialysis catheter placed by IR  Antimicrobials:  Anti-infectives (From admission, onward)   Start     Dose/Rate Route Frequency Ordered Stop   03/18/19 1015  ceFAZolin (ANCEF) IVPB 2g/100 mL premix  Status:  Discontinued     2 g 200 mL/hr over 30 Minutes Intravenous  Once 03/18/19 1006 03/18/19 1128   03/17/19 2200  meropenem (MERREM) 500 mg in sodium chloride 0.9 % 100 mL IVPB     500 mg 200 mL/hr over 30 Minutes Intravenous Every 24 hours 03/17/19 1608     03/13/19 2200  meropenem (MERREM) 500 mg in sodium chloride 0.9 % 100 mL IVPB  Status:  Discontinued     500 mg 200 mL/hr over 30 Minutes Intravenous Every 24 hours 03/12/19 2210 03/17/19 1550   03/12/19 2215  piperacillin-tazobactam (ZOSYN) IVPB 3.375 g  Status:  Discontinued     3.375 g 12.5 mL/hr over 240 Minutes Intravenous  Once 03/12/19 2205 03/12/19 2208   03/12/19 2215  meropenem (MERREM) 1 g in sodium chloride 0.9 % 100 mL IVPB     1 g 200 mL/hr over 30 Minutes Intravenous  Once 03/12/19 2210 03/13/19 0223     Subjective: While he was in dialysis he is complaining of some back pain.  No nausea or vomiting.  No other concerns or plans at this time.  Still having some significant output from his drains.  Had no other concerns or complaints and felt fatigued.  Objective: Vitals:   03/20/19 1030 03/20/19 1100 03/20/19 1115 03/20/19 1208  BP: 116/65 (!) 112/56 125/73 123/73  Pulse: 76 77 76 84  Resp:   17 18   Temp:   97.9 F (36.6 C) 98.1 F (36.7 C)  TempSrc:   Oral Oral  SpO2:   99% 100%  Weight:   88.6 kg   Height:        Intake/Output Summary (Last 24 hours) at 03/20/2019 1832 Last data filed at 03/20/2019 1307 Gross per 24 hour  Intake 10 ml  Output 1570 ml  Net -1560 ml   Filed Weights   03/18/19 1650 03/20/19 0710 03/20/19 1115  Weight: 90.2 kg 91.2 kg 88.6 kg   Examination: Physical Exam:  Constitutional: WN/WD overweight Caucasian male who is chronically ill-appearing NAD but does appear uncomfortable Eyes: Lids and conjunctivae normal, sclerae anicteric  ENMT: External Ears, Nose appear normal. Grossly normal hearing.  Neck: Appears normal, supple, no cervical masses, normal ROM, no appreciable thyromegaly; no JVD Respiratory: Diminished to auscultation bilaterally, no wheezing, rales, rhonchi or crackles. Normal respiratory effort and patient is not tachypenic. No accessory muscle use.  Cardiovascular: RRR, no murmurs / rubs / gallops. S1 and S2 auscultated.  Abdomen: Soft, non-tender, Distended. No masses palpated. No appreciable hepatosplenomegaly. Bowel sounds positive x4.  GU: Deferred. Musculoskeletal: No clubbing / cyanosis of digits/nails. No joint deformity upper  and lower extremities.  Skin: No rashes, lesions, ulcers on a limited skin evaluation. No induration; Warm and dry.  Neurologic: CN 2-12 grossly intact with no focal deficits. Romberg sign and cerebellar reflexes not assessed.  Psychiatric: Normal judgment and insight. Alert and oriented x 3. Anxious mood and appropriate affect.   Data Reviewed: I have personally reviewed following labs and imaging studies  CBC: Recent Labs  Lab 03/15/19 0241 03/16/19 0135 03/18/19 0559 03/19/19 0122 03/20/19 0152  WBC 6.0 5.3 3.7* 4.5 4.1  NEUTROABS  --   --  2.3  --   --   HGB 8.0* 8.0* 8.0* 8.1* 8.4*  HCT 25.1* 25.2* 25.2* 25.3* 26.4*  MCV 95.4 94.0 95.8 93.7 94.3  PLT 198 184 133* 154 149*   Basic  Metabolic Panel: Recent Labs  Lab 03/15/19 0241 03/15/19 0241 03/16/19 0135 03/16/19 0135 03/16/19 1443 03/17/19 0259 03/18/19 0559 03/19/19 0122 03/20/19 0152  NA 136   < > 137   < > 136 137 137 138 135  K 3.4*   < > 2.7*   < > 3.0* 3.0* 3.8 3.5 3.7  CL 101   < > 101   < > 101 102 103 105 104  CO2 23   < > 25   < > 25 25 21* 25 21*  GLUCOSE 275*   < > 188*   < > 293* 250* 163* 154* 283*  BUN 27*   < > 16   < > 22* 33* 47* 25* 41*  CREATININE 4.49*   < > 2.78*   < > 3.49* 4.01* 4.68* 2.80* 3.89*  CALCIUM 7.7*   < > 7.7*   < > 7.7* 7.7* 8.0* 7.5* 7.6*  PHOS 4.0  --  2.0*  --   --  3.6  --   --   --    < > = values in this interval not displayed.   GFR: Estimated Creatinine Clearance: 27 mL/min (A) (by C-G formula based on SCr of 3.89 mg/dL (H)). Liver Function Tests: Recent Labs  Lab 03/15/19 0241 03/16/19 0135 03/17/19 0259  ALBUMIN 1.5* 1.6* 1.5*   No results for input(s): LIPASE, AMYLASE in the last 168 hours. No results for input(s): AMMONIA in the last 168 hours. Coagulation Profile: Recent Labs  Lab 03/18/19 0559  INR 1.2   Cardiac Enzymes: No results for input(s): CKTOTAL, CKMB, CKMBINDEX, TROPONINI in the last 168 hours. BNP (last 3 results) No results for input(s): PROBNP in the last 8760 hours. HbA1C: No results for input(s): HGBA1C in the last 72 hours. CBG: Recent Labs  Lab 03/19/19 2004 03/19/19 2352 03/20/19 0354 03/20/19 1205 03/20/19 1615  GLUCAP 157* 258* 342* 191* 410*   Lipid Profile: No results for input(s): CHOL, HDL, LDLCALC, TRIG, CHOLHDL, LDLDIRECT in the last 72 hours. Thyroid Function Tests: No results for input(s): TSH, T4TOTAL, FREET4, T3FREE, THYROIDAB in the last 72 hours. Anemia Panel: No results for input(s): VITAMINB12, FOLATE, FERRITIN, TIBC, IRON, RETICCTPCT in the last 72 hours. Sepsis Labs: No results for input(s): PROCALCITON, LATICACIDVEN in the last 168 hours.  Recent Results (from the past 240 hour(s))  Blood  Culture (routine x 2)     Status: None   Collection Time: 03/12/19  9:21 PM   Specimen: BLOOD  Result Value Ref Range Status   Specimen Description BLOOD RIGHT ANTECUBITAL  Final   Special Requests   Final    BOTTLES DRAWN AEROBIC AND ANAEROBIC Blood Culture adequate volume   Culture  Final    NO GROWTH 5 DAYS Performed at [facility], [place] [address], [place], [place] [address]    Report Status 03/17/2019 FINAL  Final  Blood Culture (routine x 2)     Status: None   Collection Time: 03/12/19  9:21 PM   Specimen: BLOOD RIGHT HAND  Result Value Ref Range Status   Specimen Description BLOOD RIGHT HAND  Final   Special Requests   Final    BOTTLES DRAWN AEROBIC AND ANAEROBIC Blood Culture adequate volume   Culture   Final    NO GROWTH 5 DAYS Performed at [facility], [place] [address] [place], [place] [address]    Report Status 03/17/2019 FINAL  Final  Urine culture     Status: Abnormal   Collection Time: 03/13/19  7:24 AM   Specimen: In/Out Cath Urine  Result Value Ref Range Status   Specimen Description IN/OUT CATH URINE  Final   Special Requests   Final    NONE Performed at [facility], [place] [address], [place], [place] [address]    Culture MULTIPLE SPECIES PRESENT, SUGGEST RECOLLECTION (A)  Final   Report Status 03/14/2019 FINAL  Final  Aerobic/Anaerobic Culture (surgical/deep wound)     Status: None   Collection Time: 03/14/19 11:01 AM   Specimen: Abscess  Result Value Ref Range Status   Specimen Description ABSCESS  Final   Special Requests NONE  Final   Gram Stain   Final    ABUNDANT WBC PRESENT, PREDOMINANTLY PMN NO ORGANISMS SEEN    Culture   Final    MODERATE KLEBSIELLA PNEUMONIAE NO ANAEROBES ISOLATED Performed at [facility], [place] [address] [place], [place] [address]    Report Status 03/19/2019 FINAL  Final   Organism ID, Bacteria KLEBSIELLA PNEUMONIAE  Final      Susceptibility   Klebsiella pneumoniae - MIC*    AMPICILLIN >=32  RESISTANT Resistant     CEFAZOLIN 16 SENSITIVE Sensitive     CEFEPIME <=0.12 SENSITIVE Sensitive     CEFTAZIDIME <=1 SENSITIVE Sensitive     CEFTRIAXONE <=0.25 SENSITIVE Sensitive     CIPROFLOXACIN 1 SENSITIVE Sensitive     GENTAMICIN 8 INTERMEDIATE Intermediate     IMIPENEM 1 SENSITIVE Sensitive     TRIMETH/SULFA >=320 RESISTANT Resistant     AMPICILLIN/SULBACTAM >=32 RESISTANT Resistant     PIP/TAZO 64 INTERMEDIATE Intermediate     * MODERATE KLEBSIELLA PNEUMONIAE  SARS CORONAVIRUS 2 (TAT 6-24 HRS) Nasopharyngeal Nasopharyngeal Swab     Status: None   Collection Time: 03/19/19  6:50 PM   Specimen: Nasopharyngeal Swab  Result Value Ref Range Status   SARS Coronavirus 2 NEGATIVE NEGATIVE Final    Comment: (NOTE) SARS-CoV-2 target nucleic acids are NOT DETECTED. The SARS-CoV-2 RNA is generally detectable in upper and lower respiratory specimens during the acute phase of infection. Negative results do not preclude SARS-CoV-2 infection, do not rule out co-infections with other pathogens, and should not be used as the sole basis for treatment or other patient management decisions. Negative results must be combined with clinical observations, patient history, and epidemiological information. The expected result is Negative. Fact Sheet for Patients: SugarRoll.be Fact Sheet for Healthcare Providers: https://www.woods-mathews.com/ This test is not yet approved or cleared by the [place] FDA and  has been authorized for detection and/or diagnosis of SARS-CoV-2 by FDA under an Emergency Use Authorization (EUA). This EUA will remain  in effect (meaning this test can be used)  for the duration of the COVID-19 declaration under Section 56 4(b)(1) of the Act, 21 U.S.C. section 360bbb-3(b)(1), unless the authorization is terminated or revoked sooner. Performed at [facility], [place] [address], [place], [place] [address]      RN Pressure  Injury Documentation and I am in current Agreement with RN's Assessment Pressure Injury 04/21/18 Stage II -  Partial thickness loss of dermis presenting as a shallow open ulcer with a red, pink wound bed without slough. redness, possible previous injury (Active)  04/21/18 2200  Location: Coccyx  Location Orientation: Medial  Staging: Stage II -  Partial thickness loss of dermis presenting as a shallow open ulcer with a red, pink wound bed without slough.  Wound Description (Comments): redness, possible previous injury  Present on Admission: Yes   Estimated body mass index is 25.08 kg/m as calculated from the following:   Height as of this encounter: 6' 2"  (1.88 m).   Weight as of this encounter: 88.6 kg.  Malnutrition Type:      Malnutrition Characteristics:      Nutrition Interventions:       Radiology Studies: No results found.  Scheduled Meds: . allopurinol  100 mg Oral Daily  . carbamazepine  900 mg Oral BID  . Chlorhexidine Gluconate Cloth  6 each Topical Q0600  . feeding supplement (NEPRO CARB STEADY)  237 mL Oral TID BM  . ferrous sulfate  325 mg Oral Q breakfast  . FLUoxetine  20 mg Oral Daily  . insulin aspart  0-9 Units Subcutaneous Q4H  . insulin detemir  10 Units Subcutaneous Daily  . lactulose  20 g Oral BID  . multivitamin with minerals  1 tablet Oral QHS  . pantoprazole  40 mg Oral Daily  . PHENobarbital  243 mg Oral QHS  . simvastatin  20 mg Oral QHS  . sodium chloride flush  5 mL Intracatheter Q8H  . tamsulosin  0.4 mg Oral QHS   Continuous Infusions: . meropenem (MERREM) IV 500 mg (03/19/19 2212)    LOS: 7 days   Kerney Elbe, DO [facility] PAGER is on AMION  If 7PM-7AM, please contact night-coverage [email or web address]

## 2019-03-20 NOTE — Progress Notes (Signed)
Referring Physician(s): * No referring provider recorded for this case *  Supervising Physician: Corrie Mckusick  Patient Status:  [facility] - In-pt  Chief Complaint: Retroperitoneal fluid collections  Subjective: Patient resting comfortably in bed.  No complaints during visit.  Drains stable.   Allergies: Fish oil  Medications: Prior to Admission medications   Medication Sig Start Date End Date Taking? Authorizing Provider  acetaminophen (TYLENOL) 325 MG tablet Take 650 mg by mouth every 8 (eight) hours as needed for fever (pain).   Yes [provider]  allopurinol (ZYLOPRIM) 100 MG tablet Take 100 mg by mouth daily.  08/01/18  Yes [provider]  calcium acetate (PHOSLO) 667 MG capsule Take 2,001 mg by mouth 3 (three) times daily with meals.    Yes [provider]  carbamazepine (CARBATROL) 100 MG 12 hr capsule Take 100 mg by mouth 2 (two) times daily. 02/23/19  Yes [provider]  carbamazepine (TEGRETOL XR) 400 MG 12 hr tablet Take 800 mg by mouth 2 (two) times daily.   Yes [provider]  cloNIDine (CATAPRES) 0.1 MG tablet Take 0.1 mg by mouth 2 (two) times daily as needed (SBP ABOVE 170).  07/25/18  Yes [provider]  DULoxetine (CYMBALTA) 20 MG capsule Take 20 mg by mouth daily.   Yes [provider]  ferrous sulfate 325 (65 FE) MG tablet Take 325 mg by mouth daily with breakfast.   Yes [provider]  guaiFENesin (MUCINEX) 600 MG 12 hr tablet Take 600 mg by mouth every 12 (twelve) hours as needed for cough (congestion).   Yes [provider]  insulin aspart (NOVOLOG) 100 UNIT/ML injection Inject 0-15 Units into the skin 4 (four) times daily -  with meals and at bedtime. 70-120=0 units, 121-150=2 units, 151-200=3 units, 201-250=5 units, 251-300=8 units, 301-350=11 units, 351-400=15 units, >400 call MD   Yes [provider]  Insulin Glargine (BASAGLAR KWIKPEN) 100 UNIT/ML SOPN Inject 0.28 mLs  (28 Units total) into the skin at bedtime. Patient taking differently: Inject 20 Units into the skin at bedtime.  12/05/18 03/13/19 Yes [place] Eric J, DO  Ipratropium-Albuterol (COMBIVENT) 20-100 MCG/ACT AERS respimat Inhale 1 puff into the lungs every 6 (six) hours as needed for wheezing.   Yes [provider]  lactulose (CHRONULAC) 10 GM/15ML solution Take 15 mLs (10 g total) by mouth 2 (two) times daily. Patient taking differently: Take 30 g by mouth 2 (two) times daily.  05/31/18  Yes [name], [name] K, MD  lidocaine-prilocaine (EMLA) cream Apply 1 application topically every Monday, Wednesday, and Friday.   Yes [provider]  Multiple Vitamin (MULTIVITAMIN WITH MINERALS) TABS tablet Take 1 tablet by mouth at bedtime.   Yes [provider]  omeprazole (PRILOSEC) 20 MG capsule Take 20 mg by mouth daily.  07/04/18  Yes [provider]  ondansetron (ZOFRAN) 4 MG tablet Take 4 mg by mouth every 8 (eight) hours as needed for nausea or vomiting.  08/03/18  Yes [provider]  PHENobarbital (LUMINAL) 64.8 MG tablet Take 4 tablets (259.2 mg total) by mouth at bedtime. 12/05/18 03/13/19 Yes [place] Eric J, DO  PROTEIN PO Take 30 mLs by mouth in the morning and at bedtime.   Yes [provider]  simvastatin (ZOCOR) 20 MG tablet Take 20 mg by mouth at bedtime.    Yes [provider]  sodium bicarbonate 650 MG tablet Take 650 mg by mouth 3 (three) times daily.   Yes [provider]  tamsulosin (FLOMAX) 0.4 MG CAPS capsule Take 0.4 mg by mouth at bedtime.   Yes [provider]  Darbepoetin Alfa (ARANESP) 200 MCG/0.4ML SOSY injection Inject 0.4 mLs (200 mcg total) into the vein every Thursday with hemodialysis. 06/28/18   Amin, Jeanella Flattery, MD  HYDROcodone-acetaminophen (NORCO) 5-325 MG tablet Take 1 tablet by mouth every 6 (six) hours as needed for moderate pain. 12/05/18   [place] Donnamarie Poag, DO     Vital Signs: BP 123/73 (BP  Location: Left Arm)   Pulse 84   Temp 98.1 F (36.7 C) (Oral)   Resp 18   Ht 6' 2"  (1.88 m)   Wt 195 lb 5.2 oz (88.6 kg)   SpO2 100%   BMI 25.08 kg/m   Physical Exam  NAD, alert Abdomen/Flank:  Left upper RP drain in place with bloody fluid in bulb.  Insertion site c/d/i. Flushes easily, mild resistance with aspiration.  Left lateral RP drain in place with thin, bloody fluid in bulb. Insertion site c/d/i.  Flushes and aspirates easily.   Imaging: IR Fluoro Guide CV Line Right  Result Date: 03/18/2019 INDICATION: 49 year old male with a history of renal failure, line holiday EXAM: TUNNELED CENTRAL VENOUS HEMODIALYSIS CATHETER PLACEMENT WITH ULTRASOUND AND FLUOROSCOPIC GUIDANCE MEDICATIONS: 2 g Ancef. The antibiotic was given in an appropriate time interval prior to skin puncture. ANESTHESIA/SEDATION: Moderate (conscious) sedation was employed during this procedure. A total of Versed 0.5 mg and Fentanyl 12.5 mcg was administered intravenously. Moderate Sedation Time: 15 minutes. The patient's level of consciousness and vital signs were monitored continuously by radiology nursing throughout the procedure under my direct supervision. FLUOROSCOPY TIME:  Fluoroscopy Time: 1 minutes 0 seconds (7 mGy). COMPLICATIONS: None PROCEDURE: Informed written consent was obtained from the patient after a discussion of the risks, benefits, and alternatives to treatment. Questions regarding the procedure were encouraged and answered. The right neck and chest were prepped with chlorhexidine in a sterile fashion, and a sterile drape was applied covering the operative field. Maximum barrier sterile technique with sterile gowns and gloves were used for the procedure. A timeout was performed prior to the initiation of the procedure. After creating a small venotomy incision, a micropuncture kit was utilized to access the right internal jugular vein under direct, real-time ultrasound guidance after the overlying soft tissues  were anesthetized with 1% lidocaine with epinephrine. Ultrasound image documentation was performed. The microwire was marked to measure appropriate internal catheter length. External tunneled length was estimated. A total tip to cuff length of 19 cm was selected. Skin and subcutaneous tissues of chest wall below the clavicle were generously infiltrated with 1% lidocaine for local anesthesia. A small stab incision was made with 11 blade scalpel. The selected hemodialysis catheter was tunneled in a retrograde fashion from the anterior chest wall to the venotomy incision. A guidewire was advanced to the level of the IVC and the micropuncture sheath was exchanged for a peel-away sheath. The catheter was then placed through the peel-away sheath with tips ultimately positioned within the superior aspect of the right atrium. Final catheter positioning was confirmed and documented with a spot radiographic image. The catheter aspirates and flushes normally. The catheter was flushed with appropriate volume heparin dwells. The catheter exit site was secured with a 0-Prolene retention suture. The venotomy incision was closed Derma bond and sterile dressing. Dressings were applied at the chest wall. Patient tolerated the procedure well and remained hemodynamically stable throughout. No complications were encountered and no significant  blood loss encountered. IMPRESSION: Status post right IJ tunneled hemodialysis catheter. Catheter ready for use. Signed, Dulcy Fanny. Dellia Nims, RPVI [facility] Radiology Electronically Signed   By: Corrie Mckusick D.O.   On: 03/18/2019 12:28   IR US Guide Vasc Access Right  Result Date: 03/18/2019 INDICATION: 49 year old male with a history of renal failure, line holiday EXAM: TUNNELED CENTRAL VENOUS HEMODIALYSIS CATHETER PLACEMENT WITH ULTRASOUND AND FLUOROSCOPIC GUIDANCE MEDICATIONS: 2 g Ancef. The antibiotic was given in an appropriate time  interval prior to skin puncture. ANESTHESIA/SEDATION: Moderate (conscious) sedation was employed during this procedure. A total of Versed 0.5 mg and Fentanyl 12.5 mcg was administered intravenously. Moderate Sedation Time: 15 minutes. The patient's level of consciousness and vital signs were monitored continuously by radiology nursing throughout the procedure under my direct supervision. FLUOROSCOPY TIME:  Fluoroscopy Time: 1 minutes 0 seconds (7 mGy). COMPLICATIONS: None PROCEDURE: Informed written consent was obtained from the patient after a discussion of the risks, benefits, and alternatives to treatment. Questions regarding the procedure were encouraged and answered. The right neck and chest were prepped with chlorhexidine in a sterile fashion, and a sterile drape was applied covering the operative field. Maximum barrier sterile technique with sterile gowns and gloves were used for the procedure. A timeout was performed prior to the initiation of the procedure. After creating a small venotomy incision, a micropuncture kit was utilized to access the right internal jugular vein under direct, real-time ultrasound guidance after the overlying soft tissues were anesthetized with 1% lidocaine with epinephrine. Ultrasound image documentation was performed. The microwire was marked to measure appropriate internal catheter length. External tunneled length was estimated. A total tip to cuff length of 19 cm was selected. Skin and subcutaneous tissues of chest wall below the clavicle were generously infiltrated with 1% lidocaine for local anesthesia. A small stab incision was made with 11 blade scalpel. The selected hemodialysis catheter was tunneled in a retrograde fashion from the anterior chest wall to the venotomy incision. A guidewire was advanced to the level of the IVC and the micropuncture sheath was exchanged for a peel-away sheath. The catheter was then placed through the peel-away sheath with tips ultimately  positioned within the superior aspect of the right atrium. Final catheter positioning was confirmed and documented with a spot radiographic image. The catheter aspirates and flushes normally. The catheter was flushed with appropriate volume heparin dwells. The catheter exit site was secured with a 0-Prolene retention suture. The venotomy incision was closed Derma bond and sterile dressing. Dressings were applied at the chest wall. Patient tolerated the procedure well and remained hemodynamically stable throughout. No complications were encountered and no significant blood loss encountered. IMPRESSION: Status post right IJ tunneled hemodialysis catheter. Catheter ready for use. Signed, Dulcy Fanny. Dellia Nims, RPVI [facility] Radiology Electronically Signed   By: Corrie Mckusick D.O.   On: 03/18/2019 12:28    Labs:  CBC: Recent Labs    03/16/19 0135 03/18/19 0559 03/19/19 0122 03/20/19 0152  WBC 5.3 3.7* 4.5 4.1  HGB 8.0* 8.0* 8.1* 8.4*  HCT 25.2* 25.2* 25.3* 26.4*  PLT 184 133* 154 149*    COAGS: Recent Labs    05/19/18 1339 05/21/18 0419 05/29/18 0500 06/18/18 1319 03/12/19 2331 03/18/19 0559  INR 1.6*   < > 1.3* 1.9* 1.7* 1.2  APTT 47*  --   --   --  39*  --    < > = values in  this interval not displayed.    BMP: Recent Labs    03/17/19 0259 03/18/19 0559 03/19/19 0122 03/20/19 0152  NA 137 137 138 135  K 3.0* 3.8 3.5 3.7  CL 102 103 105 104  CO2 25 21* 25 21*  GLUCOSE 250* 163* 154* 283*  BUN 33* 47* 25* 41*  CALCIUM 7.7* 8.0* 7.5* 7.6*  CREATININE 4.01* 4.68* 2.80* 3.89*  GFRNONAA 17* 14* 26* 17*  GFRAA 19* 16* 30* 20*    LIVER FUNCTION TESTS: Recent Labs    10/23/18 1043 10/23/18 1043 11/27/18 2019 12/03/18 0947 03/12/19 2051 03/12/19 2051 03/13/19 0423 03/13/19 0423 03/13/19 1230 03/15/19 0241 03/16/19 0135 03/17/19 0259  BILITOT 0.4  --  0.7  --  0.8  --  0.4  --   --   --   --   --   AST 22  --  17   --  10*  --  10*  --   --   --   --   --   ALT 20  --  14  --  8  --  7  --   --   --   --   --   ALKPHOS 145*  --  125  --  124  --  108  --   --   --   --   --   PROT 7.3  --  6.5  --  6.6  --  5.6*  --   --   --   --   --   ALBUMIN 3.1*   < > 2.7*   < > 1.8*   < > 1.5*   < > 1.5* 1.5* 1.6* 1.5*   < > = values in this interval not displayed.    Assessment and Plan: Retroperitoneal abscesses s/p drainage x2 Tunneled HD catheter replacement 2/22 Patient remains stable.  Drain remain in place.  Output continues to be significant with 20-30+ mL/day from each drain.  Patient may be nearing discharge.  Will place outpatient drain care and follow-up orders.  Continue current management.   Electronically Signed: Docia Barrier, PA 03/20/2019, 2:28 PM   I spent a total of 15 Minutes at the the patient's bedside AND on the patient's hospital floor or unit, greater than 50% of which was counseling/coordinating care for retroperitoneal abscesses.

## 2019-03-20 NOTE — Progress Notes (Signed)
PT Cancellation Note  Patient Details Name: Eddie Davies MRN: [id number] DOB: December 10, 1970   Cancelled Treatment:    Reason Eval/Treat Not Completed: Patient at procedure or test/unavailable(pt at HD. Will follow.)  Philomena Doheny PT 03/20/2019  Acute Rehabilitation Services Pager [phone] Office [phone]

## 2019-03-21 LAB — CBC WITH DIFFERENTIAL/PLATELET
Abs Immature Granulocytes: 0.03 10*3/uL (ref 0.00–0.07)
Basophils Absolute: 0 10*3/uL (ref 0.0–0.1)
Basophils Relative: 1 %
Eosinophils Absolute: 0.1 10*3/uL (ref 0.0–0.5)
Eosinophils Relative: 2 %
HCT: 25.9 % — ABNORMAL LOW (ref 39.0–52.0)
Hemoglobin: 8.4 g/dL — ABNORMAL LOW (ref 13.0–17.0)
Immature Granulocytes: 1 %
Lymphocytes Relative: 24 %
Lymphs Abs: 1.4 10*3/uL (ref 0.7–4.0)
MCH: 30.1 pg (ref 26.0–34.0)
MCHC: 32.4 g/dL (ref 30.0–36.0)
MCV: 92.8 fL (ref 80.0–100.0)
Monocytes Absolute: 0.4 10*3/uL (ref 0.1–1.0)
Monocytes Relative: 7 %
Neutro Abs: 3.9 10*3/uL (ref 1.7–7.7)
Neutrophils Relative %: 65 %
Platelets: 146 10*3/uL — ABNORMAL LOW (ref 150–400)
RBC: 2.79 MIL/uL — ABNORMAL LOW (ref 4.22–5.81)
RDW: 16.2 % — ABNORMAL HIGH (ref 11.5–15.5)
WBC: 5.9 10*3/uL (ref 4.0–10.5)
nRBC: 0 % (ref 0.0–0.2)

## 2019-03-21 LAB — COMPREHENSIVE METABOLIC PANEL
ALT: 9 U/L (ref 0–44)
AST: 16 U/L (ref 15–41)
Albumin: 1.8 g/dL — ABNORMAL LOW (ref 3.5–5.0)
Alkaline Phosphatase: 122 U/L (ref 38–126)
Anion gap: 9 (ref 5–15)
BUN: 28 mg/dL — ABNORMAL HIGH (ref 6–20)
CO2: 26 mmol/L (ref 22–32)
Calcium: 7.7 mg/dL — ABNORMAL LOW (ref 8.9–10.3)
Chloride: 99 mmol/L (ref 98–111)
Creatinine, Ser: 2.74 mg/dL — ABNORMAL HIGH (ref 0.61–1.24)
GFR calc Af Amer: 30 mL/min — ABNORMAL LOW (ref >60)
GFR calc non Af Amer: 26 mL/min — ABNORMAL LOW (ref >60)
Glucose, Bld: 140 mg/dL — ABNORMAL HIGH (ref 70–99)
Potassium: 3.4 mmol/L — ABNORMAL LOW (ref 3.5–5.1)
Sodium: 134 mmol/L — ABNORMAL LOW (ref 135–145)
Total Bilirubin: 0.4 mg/dL (ref 0.3–1.2)
Total Protein: 6.2 g/dL — ABNORMAL LOW (ref 6.5–8.1)

## 2019-03-21 LAB — GLUCOSE, CAPILLARY
Glucose-Capillary: 116 mg/dL — ABNORMAL HIGH (ref 70–99)
Glucose-Capillary: 153 mg/dL — ABNORMAL HIGH (ref 70–99)
Glucose-Capillary: 86 mg/dL (ref 70–99)
Glucose-Capillary: 147 mg/dL — ABNORMAL HIGH (ref 70–99)
Glucose-Capillary: 166 mg/dL — ABNORMAL HIGH (ref 70–99)

## 2019-03-21 LAB — PHOSPHORUS: Phosphorus: 3.9 mg/dL (ref 2.5–4.6)

## 2019-03-21 LAB — MAGNESIUM: Magnesium: 2.1 mg/dL (ref 1.7–2.4)

## 2019-03-21 MED ORDER — DARBEPOETIN ALFA 150 MCG/0.3ML IJ SOSY
150.0000 ug | PREFILLED_SYRINGE | INTRAMUSCULAR | Status: DC
  Administered 2019-03-22: 150 ug via INTRAVENOUS
  Filled 2019-03-21: qty 0.3

## 2019-03-21 NOTE — Progress Notes (Signed)
Referring Physician(s): Jennette Kettle  Supervising Physician: Corrie Mckusick  Patient Status:  [facility] - In-pt  Chief Complaint: Follow up left retroperitoneal drains x 2 placed 03/14/19 by Dr. Anselm Pancoast  Subjective:  Patient laying in bed, stool present on linens and lower back. He states he is in pain all over and "nothing has changed." He had an omelette this morning that he was disappointed in because it was mostly peppers and onions. He is not sure when he is going home but hopes it is soon and that his drains can be removed before he leaves.  Allergies: Fish oil  Medications: Prior to Admission medications   Medication Sig Start Date End Date Taking? Authorizing Provider  acetaminophen (TYLENOL) 325 MG tablet Take 650 mg by mouth every 8 (eight) hours as needed for fever (pain).   Yes [provider]  allopurinol (ZYLOPRIM) 100 MG tablet Take 100 mg by mouth daily.  08/01/18  Yes [provider]  calcium acetate (PHOSLO) 667 MG capsule Take 2,001 mg by mouth 3 (three) times daily with meals.    Yes [provider]  carbamazepine (CARBATROL) 100 MG 12 hr capsule Take 100 mg by mouth 2 (two) times daily. 02/23/19  Yes [provider]  carbamazepine (TEGRETOL XR) 400 MG 12 hr tablet Take 800 mg by mouth 2 (two) times daily.   Yes [provider]  cloNIDine (CATAPRES) 0.1 MG tablet Take 0.1 mg by mouth 2 (two) times daily as needed (SBP ABOVE 170).  07/25/18  Yes [provider]  DULoxetine (CYMBALTA) 20 MG capsule Take 20 mg by mouth daily.   Yes [provider]  ferrous sulfate 325 (65 FE) MG tablet Take 325 mg by mouth daily with breakfast.   Yes [provider]  guaiFENesin (MUCINEX) 600 MG 12 hr tablet Take 600 mg by mouth every 12 (twelve) hours as needed for cough (congestion).   Yes [provider]  insulin aspart (NOVOLOG) 100 UNIT/ML injection Inject 0-15 Units into the skin 4 (four) times daily -  with  meals and at bedtime. 70-120=0 units, 121-150=2 units, 151-200=3 units, 201-250=5 units, 251-300=8 units, 301-350=11 units, 351-400=15 units, >400 call MD   Yes [provider]  Insulin Glargine (BASAGLAR KWIKPEN) 100 UNIT/ML SOPN Inject 0.28 mLs (28 Units total) into the skin at bedtime. Patient taking differently: Inject 20 Units into the skin at bedtime.  12/05/18 03/13/19 Yes [place] Eric J, DO  Ipratropium-Albuterol (COMBIVENT) 20-100 MCG/ACT AERS respimat Inhale 1 puff into the lungs every 6 (six) hours as needed for wheezing.   Yes [provider]  lactulose (CHRONULAC) 10 GM/15ML solution Take 15 mLs (10 g total) by mouth 2 (two) times daily. Patient taking differently: Take 30 g by mouth 2 (two) times daily.  05/31/18  Yes [name], [name] K, MD  lidocaine-prilocaine (EMLA) cream Apply 1 application topically every Monday, Wednesday, and Friday.   Yes [provider]  Multiple Vitamin (MULTIVITAMIN WITH MINERALS) TABS tablet Take 1 tablet by mouth at bedtime.   Yes [provider]  omeprazole (PRILOSEC) 20 MG capsule Take 20 mg by mouth daily.  07/04/18  Yes [provider]  ondansetron (ZOFRAN) 4 MG tablet Take 4 mg by mouth every 8 (eight) hours as needed for nausea or vomiting.  08/03/18  Yes [provider]  PHENobarbital (LUMINAL) 64.8 MG tablet Take 4 tablets (259.2 mg total) by mouth at bedtime. 12/05/18 03/13/19 Yes [place] Eric J, DO  PROTEIN PO Take 30  mLs by mouth in the morning and at bedtime.   Yes [provider]  simvastatin (ZOCOR) 20 MG tablet Take 20 mg by mouth at bedtime.    Yes [provider]  sodium bicarbonate 650 MG tablet Take 650 mg by mouth 3 (three) times daily.   Yes [provider]  tamsulosin (FLOMAX) 0.4 MG CAPS capsule Take 0.4 mg by mouth at bedtime.   Yes [provider]  Darbepoetin Alfa (ARANESP) 200 MCG/0.4ML SOSY injection Inject 0.4 mLs (200 mcg total) into the vein  every Thursday with hemodialysis. 06/28/18   Amin, Jeanella Flattery, MD  HYDROcodone-acetaminophen (NORCO) 5-325 MG tablet Take 1 tablet by mouth every 6 (six) hours as needed for moderate pain. 12/05/18   [place] Donnamarie Poag, DO     Vital Signs: BP 115/74 (BP Location: Left Arm)   Pulse 65   Temp (!) 97.5 F (36.4 C) (Oral)   Resp 16   Ht _0  (1.88 m)   Wt 195 lb 5.2 oz (88.6 kg)   SpO2 100%   BMI 25.08 kg/m   Physical Exam Vitals and nursing note reviewed.  Constitutional:      General: He is not in acute distress.    Appearance: He is ill-appearing.  HENT:     Head: Normocephalic.  Cardiovascular:     Rate and Rhythm: Normal rate.  Pulmonary:     Effort: Pulmonary effort is normal.  Abdominal:     Comments: (+) left flank drain labeled #1 inverted on exam with scant bloody output - flushed/aspirated and suction bulb charged by pinching with return of ~10 cc of bloody fluid. Insertion site unremarkable. (+) left flank drain labeled #2 with ~30 cc of bloody output, flushed/aspirated. Insertion site unremarkable.   Skin:    General: Skin is warm and dry.  Neurological:     Mental Status: He is alert. Mental status is at baseline.     Imaging: IR Fluoro Guide CV Line Right  Result Date: 03/18/2019 INDICATION: 49 year old male with a history of renal failure, line holiday EXAM: TUNNELED CENTRAL VENOUS HEMODIALYSIS CATHETER PLACEMENT WITH ULTRASOUND AND FLUOROSCOPIC GUIDANCE MEDICATIONS: 2 g Ancef. The antibiotic was given in an appropriate time interval prior to skin puncture. ANESTHESIA/SEDATION: Moderate (conscious) sedation was employed during this procedure. A total of Versed 0.5 mg and Fentanyl 12.5 mcg was administered intravenously. Moderate Sedation Time: 15 minutes. The patient's level of consciousness and vital signs were monitored continuously by radiology nursing throughout the procedure under my direct supervision. FLUOROSCOPY TIME:  Fluoroscopy Time: 1 minutes 0 seconds  (7 mGy). COMPLICATIONS: None PROCEDURE: Informed written consent was obtained from the patient after a discussion of the risks, benefits, and alternatives to treatment. Questions regarding the procedure were encouraged and answered. The right neck and chest were prepped with chlorhexidine in a sterile fashion, and a sterile drape was applied covering the operative field. Maximum barrier sterile technique with sterile gowns and gloves were used for the procedure. A timeout was performed prior to the initiation of the procedure. After creating a small venotomy incision, a micropuncture kit was utilized to access the right internal jugular vein under direct, real-time ultrasound guidance after the overlying soft tissues were anesthetized with 1% lidocaine with epinephrine. Ultrasound image documentation was performed. The microwire was marked to measure appropriate internal catheter length. External tunneled length was estimated. A total tip to cuff length of 19 cm was selected. Skin and subcutaneous tissues of chest wall below the clavicle were generously  infiltrated with 1% lidocaine for local anesthesia. A small stab incision was made with 11 blade scalpel. The selected hemodialysis catheter was tunneled in a retrograde fashion from the anterior chest wall to the venotomy incision. A guidewire was advanced to the level of the IVC and the micropuncture sheath was exchanged for a peel-away sheath. The catheter was then placed through the peel-away sheath with tips ultimately positioned within the superior aspect of the right atrium. Final catheter positioning was confirmed and documented with a spot radiographic image. The catheter aspirates and flushes normally. The catheter was flushed with appropriate volume heparin dwells. The catheter exit site was secured with a 0-Prolene retention suture. The venotomy incision was closed Derma bond and sterile dressing. Dressings were applied at the chest wall. Patient  tolerated the procedure well and remained hemodynamically stable throughout. No complications were encountered and no significant blood loss encountered. IMPRESSION: Status post right IJ tunneled hemodialysis catheter. Catheter ready for use. Signed, Dulcy Fanny. Dellia Nims, RPVI [facility] Special[facility] Radiology Electronically Signed   By: Corrie Mckusick D.O.   On: 03/18/2019 12:28   IR US Guide Vasc Access Right  Result Date: 03/18/2019 INDICATION: 49 year old male with a history of renal failure, line holiday EXAM: TUNNELED CENTRAL VENOUS HEMODIALYSIS CATHETER PLACEMENT WITH ULTRASOUND AND FLUOROSCOPIC GUIDANCE MEDICATIONS: 2 g Ancef. The antibiotic was given in an appropriate time interval prior to skin puncture. ANESTHESIA/SEDATION: Moderate (conscious) sedation was employed during this procedure. A total of Versed 0.5 mg and Fentanyl 12.5 mcg was administered intravenously. Moderate Sedation Time: 15 minutes. The patient's level of consciousness and vital signs were monitored continuously by radiology nursing throughout the procedure under my direct supervision. FLUOROSCOPY TIME:  Fluoroscopy Time: 1 minutes 0 seconds (7 mGy). COMPLICATIONS: None PROCEDURE: Informed written consent was obtained from the patient after a discussion of the risks, benefits, and alternatives to treatment. Questions regarding the procedure were encouraged and answered. The right neck and chest were prepped with chlorhexidine in a sterile fashion, and a sterile drape was applied covering the operative field. Maximum barrier sterile technique with sterile gowns and gloves were used for the procedure. A timeout was performed prior to the initiation of the procedure. After creating a small venotomy incision, a micropuncture kit was utilized to access the right internal jugular vein under direct, real-time ultrasound guidance after the overlying soft tissues were anesthetized with 1% lidocaine with  epinephrine. Ultrasound image documentation was performed. The microwire was marked to measure appropriate internal catheter length. External tunneled length was estimated. A total tip to cuff length of 19 cm was selected. Skin and subcutaneous tissues of chest wall below the clavicle were generously infiltrated with 1% lidocaine for local anesthesia. A small stab incision was made with 11 blade scalpel. The selected hemodialysis catheter was tunneled in a retrograde fashion from the anterior chest wall to the venotomy incision. A guidewire was advanced to the level of the IVC and the micropuncture sheath was exchanged for a peel-away sheath. The catheter was then placed through the peel-away sheath with tips ultimately positioned within the superior aspect of the right atrium. Final catheter positioning was confirmed and documented with a spot radiographic image. The catheter aspirates and flushes normally. The catheter was flushed with appropriate volume heparin dwells. The catheter exit site was secured with a 0-Prolene retention suture. The venotomy incision was closed Derma bond and sterile dressing. Dressings were applied at the chest wall. Patient tolerated the procedure well and remained hemodynamically  stable throughout. No complications were encountered and no significant blood loss encountered. IMPRESSION: Status post right IJ tunneled hemodialysis catheter. Catheter ready for use. Signed, Dulcy Fanny. Dellia Nims, RPVI [facility] Interventional Radiology Specialists [facility] Radiology Electronically Signed   By: Corrie Mckusick D.O.   On: 03/18/2019 12:28    Labs:  CBC: Recent Labs    03/18/19 0559 03/19/19 0122 03/20/19 0152 03/21/19 0146  WBC 3.7* 4.5 4.1 5.9  HGB 8.0* 8.1* 8.4* 8.4*  HCT 25.2* 25.3* 26.4* 25.9*  PLT 133* 154 149* 146*    COAGS: Recent Labs    05/19/18 1339 05/21/18 0419 05/29/18 0500 06/18/18 1319 03/12/19 2331 03/18/19 0559  INR 1.6*   < > 1.3* 1.9* 1.7* 1.2    APTT 47*  --   --   --  39*  --    < > = values in this interval not displayed.    BMP: Recent Labs    03/18/19 0559 03/19/19 0122 03/20/19 0152 03/21/19 0146  NA 137 138 135 134*  K 3.8 3.5 3.7 3.4*  CL 103 105 104 99  CO2 21* 25 21* 26  GLUCOSE 163* 154* 283* 140*  BUN 47* 25* 41* 28*  CALCIUM 8.0* 7.5* 7.6* 7.7*  CREATININE 4.68* 2.80* 3.89* 2.74*  GFRNONAA 14* 26* 17* 26*  GFRAA 16* 30* 20* 30*    LIVER FUNCTION TESTS: Recent Labs    11/27/18 2019 12/03/18 0947 03/12/19 2051 03/12/19 2051 03/13/19 0423 03/13/19 1230 03/15/19 0241 03/16/19 0135 03/17/19 0259 03/21/19 0146  BILITOT 0.7  --  0.8  --  0.4  --   --   --   --  0.4  AST 17  --  10*  --  10*  --   --   --   --  16  ALT 14  --  8  --  7  --   --   --   --  9  ALKPHOS 125  --  124  --  108  --   --   --   --  122  PROT 6.5  --  6.6  --  5.6*  --   --   --   --  6.2*  ALBUMIN 2.7*   < > 1.8*   < > 1.5*   < > 1.5* 1.6* 1.5* 1.8*   < > = values in this interval not displayed.    Assessment and Plan:  49 y/o M s/p left retroperitoneal drains x 2 follow up. Patient continues to endorse soreness at drain sites - he is not significantly tender to palpation on my exam today, suspect discomfort from laying on drains.   Drain labeled #1 inverted initially on exam with scant serosanguineous output - flushed/aspirted and returned to pinch suction with immediate return of 10+ cc serosanguineous fluid. Per I/O 45 cc output in last 24H. Drain labeled #2 to appropriate suction on exam with ~30 cc serosanguineous output. Per I/O 20 cc output in last 24H.  Continue TID flushes with 5 cc NS, record drain output Qshift, dressing changes QD or PRN when soiled. Please do not invert the suction bulbs as this does not provide appropriate suction - pinch bulb between thumb and forefinger before closing valve to charge bulb.    IR will continue to follow - d/c instructions/orders for follow up have been placed as per chart  patient my be nearing d/c. The drains will need to be flushed QD with 5 cc NS,  record output from each at least once daily, dressing changes every 2-3 days or as needed if soiled, do not submerge drains. He will follow up with IR clinic 10-14 days post d/c - scheduler will call with appointment date/time.  Please call with questions or concerns.   Electronically Signed: Joaquim Nam, [name] 03/21/2019, 2:15 PM   I spent a total of 15 Minutes at the the patient's bedside AND on the patient's hospital floor or unit, greater than 50% of which was counseling/coordinating care for left retroperitoneal drains x 2.

## 2019-03-21 NOTE — Plan of Care (Signed)
Progressing well with care plan.

## 2019-03-21 NOTE — Progress Notes (Signed)
PT Cancellation Note  Patient Details Name: Eddie Davies MRN: [id number] DOB: 1970-08-18   Cancelled Treatment:    Reason Eval/Treat Not Completed: Patient declined, no reason specified Pt stated he was too tired, unable to work with therapy today. Pt educated on importance of movement, refuses to work 3 times.   Ann Held PT, DPT Acute Rehab [place] P: [place] 03/21/2019, 3:06 PM

## 2019-03-21 NOTE — Progress Notes (Addendum)
PHARMACY CONSULT NOTE FOR:  OUTPATIENT  PARENTERAL ANTIBIOTIC THERAPY (OPAT)  Indication: klebsiella pneumoniae retroperitoneal abscess  Regimen: meropenem 522m IV q24h  End date: 03/28/2019  IV antibiotic discharge orders are pended. To discharging provider:  please sign these orders via discharge navigator,  Select New Orders & click on the button choice - Manage This Unsigned Work.     Thank you for allowing pharmacy to be a part of this patient's care.  GCristela Felt PharmD PGY1 Pharmacy Resident [employer]: [phone] 03/21/2019, 2:06 PM

## 2019-03-21 NOTE — TOC Progression Note (Signed)
Transition of Care [facility]) - Progression Note    Patient Details  Name: Eddie Davies MRN: [id number] Date of Birth: 1970/11/19  Transition of Care [facility]) CM/SW [place], [place] Phone Number: 03/21/2019, 1:30 PM  Clinical Narrative:    [facility] team continuing to follow for transition back to [facility] when ready.    Expected Discharge Plan: [place] Barriers to Discharge: Continued Medical Work up  Expected Discharge Plan and Services Expected Discharge Plan: [place] In-house Referral: Clinical Social Work Discharge Planning Services: CM Consult Post Acute Care Choice: [place], Resumption of Svcs/PTA Provider Living arrangements for the past 2 months: [place]  Readmission Risk Interventions Readmission Risk Prevention Plan 03/19/2019 11/29/2018 05/28/2018  Transportation Screening Complete Complete -  PCP or Specialist Appt within 5-7 Days - - -  Home Care Screening - - -  Medication Review (RN CM) - - -  Medication Review (RN Transport planner) Referral to Pharmacy Complete -  PCP or Specialist appointment within 3-5 days of discharge Not Complete Not Complete -  PCP/Specialist Appt Not Complete comments LTC SNF resident pt is SNF resident -  [place] or [place] Not Complete Not Complete Complete  HRI or Home Care Consult Pt Refusal Comments LTC SNF resident pt is SNF resident -  SW Recovery Care/Counseling Consult Complete Complete Complete  Palliative Care Screening Not Applicable Not Applicable -  Skilled Nursing Facility Complete Complete -  Some recent data might be hidden

## 2019-03-21 NOTE — Progress Notes (Signed)
PROGRESS NOTE    Eddie Davies  ERD:[id number] DOB: 30-Jun-1970 DOA: 03/12/2019 PCP: Practice, [facility]  Brief Narrative:  Eddie Davies a 49 y.o.malewith a history of ESRD on HD, ESBLwho was admitted early this morning by Dr. Nilda Simmer retroperitoneal abscess from SNF.He was recently admitted for left-sided pyelonephritis and sepsis status post left-sided radical nephrectomy and right double-J stent placement at OSH who presented with N/V/fever. Has since been evaluated by nephrology and patient underwent HD and has been dialyzing.   From H&P: Patient has been battling K.Pneumo for past several months, recent admits include:  11/3-11/11/2018 at [facility] for sepsis related to L sided pyelo  12/26/2018-01/29/2019 Admission to [facility] for sepsis, concern for dialysis cath infection, klebsiella bacteremia, left-sided emphysematous pyelonephritis with right renal pelvis stone, left-sided radical nephrectomy and right stent performed on 01/09/2019, subsequently in ICU on vasopressors, + Covid test on 01/03/2019, uncontrolled blood sugars without DKA  01/12-01/20/21 Admission to [facility] for retroperitoneal fluid collection, IV abx with IR drain placement 02/06/19, with improvement on repeat imaging  03/07/2019 R ureteral stent removal.  2/18: Status post CT-guided drain placement x2.  ID consulted. 2/19: Tunneled HD catheter removed via IR 2/22: R IJ TDC placed by IR  Today the patient is doing okay and is nearing discharge.  OPAT IV Abx orders have been pended and IR 3 times daily flushes with 5 cc of normal saline and recording drain output every shift dressing changes every day or when soiled and they recommend not tender to suction bulbs.  Evaluated and recommending continuing empiric meropenem.  Anticipating discharge home after dialysis in the a.m.  Assessment & Plan:   Principal Problem:   Retroperitoneal abscess ([place]) Active Problems:   End stage liver disease (HCC)   Seizures (HCC)   Sepsis ([place])   Benign essential HTN   Diabetes mellitus type 2, uncontrolled, with complications ([place])   ESRD (end stage renal disease) on dialysis ([place])   Infection due to ESBL-producing Klebsiella pneumoniae  Left retroperitoneal Klebsiella pneumonia abscess with history of ESBL Klebsiella bacteremia  -2/18: status post CT-guided drain placement x2 via IR -2/19: Tunneled HD catheter removed via IR for 48 hr line holiday -2/22: R IJ TDC placed -Has not had a nephrectomy and urology felt he is not a candidate to have an open washout -Per ID: 2 weeks of Carbapenem therapy from date of HD line removal (2/19->3/4) then switch to cefepime with HD and complete an additional 2 weeks and be sure his abscesses have resolved and drains removed. Will need a separate line for this prior to discharge and will order a Midline if ok with Nephrology  -Repeat CT scan when drain output<10 to 15 mL/day for 2-3 days, currently 65 mL and 55 mL respectively NOTE: Must have discharge instructions in DC Summary to SNF for CT scan if he is discharged prior to getting this study -Interventional radiology evaluated and output continues to be significantly least 20 to 30 mL/day in the drains -Patient is getting close to being discharged soon to SNF and anticipating discharging in the next 24 to 48 hours; Repeat SARS-CoV-2 Test negative  -Continues to have Back Pain   Hypokalemia -Potassium this morning was 3.4 -Replete with p.o. potassium chloride 40 mEq -Continue monitor and replete as necessary -Repeat CMP in a.m.  ESLD on lactulose -Checked LFTs and are within normal limits  ESRD on HD M, W, F -HD yesterday -Patient's BUN/Cr was 41/3.89 and today is 28/2.74 -Continue  to Monitor and Trend Renal Fxn -Repeat CMP In AM    Seizure disorder  -continue Tegretol and phenobarbital  Diabetes -C/w Levemir to 10 units daily, continue sliding scale and adjust; Now  change q4h SSI to AC/HS and added 2 units Novolog TIDwm -CBG's ranging from 48-250  Metabolic bone disease  -C/w PhosLo  Normocytic Anemia/Anemia of CKD -Was given ESA as outpatient -Patient's Hgb/Hct is stable at 8.4/25.9 -Continue to Monitor for S/Sx of Bleeding; Currently no Overt Bleeding noted -Repeat CBC in AM   Hypertension  -continue current regimen and volume removal per nephro  Chronic nonobstructing right urolithiasis -stable by CT, -Follow up outpatient with Urology   Hyperlipidemia  -On statin  Thrombocytopenia -Mild as Platelet Count is now 146 -Continue to Monitor for S/Sx of Bleeding; Currently no Overt Bleeding noted -Repeat CBC in AM   DVT prophylaxis: Heparin 5,000 units sq q8h Code Status: FULL CODE  Family Communication: No family present at bedside  Disposition Plan: Anticipate D/C back to SNF in the next 24-48 hours  Consultants:   Nephrology  Urology  Interventional Radiology   Infectious Diseases    Procedures:  2/17 HD, on MWF schedule 2/18 CT-guided drain placement x2 2/19 HD catheter removal 2/22 R IJ tunneled dialysis catheter placed by IR  Antimicrobials:  Anti-infectives (From admission, onward)   Start     Dose/Rate Route Frequency Ordered Stop   03/18/19 1015  ceFAZolin (ANCEF) IVPB 2g/100 mL premix  Status:  Discontinued     2 g 200 mL/hr over 30 Minutes Intravenous  Once 03/18/19 1006 03/18/19 1128   03/17/19 2200  meropenem (MERREM) 500 mg in sodium chloride 0.9 % 100 mL IVPB     500 mg 200 mL/hr over 30 Minutes Intravenous Every 24 hours 03/17/19 1608     03/13/19 2200  meropenem (MERREM) 500 mg in sodium chloride 0.9 % 100 mL IVPB  Status:  Discontinued     500 mg 200 mL/hr over 30 Minutes Intravenous Every 24 hours 03/12/19 2210 03/17/19 1550   03/12/19 2215  piperacillin-tazobactam (ZOSYN) IVPB 3.375 g  Status:  Discontinued     3.375 g 12.5 mL/hr over 240 Minutes Intravenous  Once 03/12/19 2205 03/12/19 2208    03/12/19 2215  meropenem (MERREM) 1 g in sodium chloride 0.9 % 100 mL IVPB     1 g 200 mL/hr over 30 Minutes Intravenous  Once 03/12/19 2210 03/13/19 0223     Subjective: Seen and examined at bedside he is still complaining of some pain.  No nausea or vomiting.  Wanted to watch TV and be left alone.  No other concerns or complaints at this time.  Objective: Vitals:   03/20/19 1208 03/20/19 2149 03/21/19 0656 03/21/19 1545  BP: 123/73 128/76 115/74 107/68  Pulse: 84 73 65 63  Resp: 18 16 16 16   Temp: 98.1 F (36.7 C) 97.9 F (36.6 C) (!) 97.5 F (36.4 C) (!) 97.5 F (36.4 C)  TempSrc: Oral Oral Oral Oral  SpO2: 100% 100% 100% 99%  Weight:      Height:        Intake/Output Summary (Last 24 hours) at 03/21/2019 1725 Last data filed at 03/21/2019 1541 Gross per 24 hour  Intake 700 ml  Output 280 ml  Net 420 ml   Filed Weights   03/18/19 1650 03/20/19 0710 03/20/19 1115  Weight: 90.2 kg 91.2 kg 88.6 kg   Examination: Physical Exam:  Constitutional: WN/WD overweight Caucasian male who is chronically ill-appearing  laying in bed in no acute distress but does appear uncomfortable and somewhat depressed appearing Eyes: Lids and conjunctivae normal, sclerae anicteric  ENMT: External Ears, Nose appear normal. Grossly normal hearing.  Neck: Appears normal, supple, no cervical masses, normal ROM, no appreciable thyromegaly; no JVD Respiratory: Diminished to auscultation bilaterally, no wheezing, rales, rhonchi or crackles. Normal respiratory effort and patient is not tachypenic. No accessory muscle use.  Unlabored breathing Cardiovascular: RRR, no murmurs / rubs / gallops. S1 and S2 auscultated.  Abdomen: Soft, slightly tender to palpate, mildly distended secondary body habitus. Bowel sounds positive.  GU: Deferred.  Has 2 JP drains coming from his back Musculoskeletal: No clubbing / cyanosis of digits/nails. No joint deformity upper and lower extremities.  Skin: No rashes, lesions,  ulcers on limited skin evaluation. No induration; Warm and dry.  Neurologic: CN 2-12 grossly intact with no focal deficits. Romberg sign cerebellar reflexes not assessed.  Psychiatric: Normal judgment and insight. Alert and oriented x 3.  Depressed appearing mood and flat affect  Data Reviewed: I have personally reviewed following labs and imaging studies  CBC: Recent Labs  Lab 03/16/19 0135 03/18/19 0559 03/19/19 0122 03/20/19 0152 03/21/19 0146  WBC 5.3 3.7* 4.5 4.1 5.9  NEUTROABS  --  2.3  --   --  3.9  HGB 8.0* 8.0* 8.1* 8.4* 8.4*  HCT 25.2* 25.2* 25.3* 26.4* 25.9*  MCV 94.0 95.8 93.7 94.3 92.8  PLT 184 133* 154 149* 902*   Basic Metabolic Panel: Recent Labs  Lab 03/15/19 0241 03/15/19 0241 03/16/19 0135 03/16/19 1443 03/17/19 0259 03/18/19 0559 03/19/19 0122 03/20/19 0152 03/21/19 0146  NA 136   < > 137   < > 137 137 138 135 134*  K 3.4*   < > 2.7*   < > 3.0* 3.8 3.5 3.7 3.4*  CL 101   < > 101   < > 102 103 105 104 99  CO2 23   < > 25   < > 25 21* 25 21* 26  GLUCOSE 275*   < > 188*   < > 250* 163* 154* 283* 140*  BUN 27*   < > 16   < > 33* 47* 25* 41* 28*  CREATININE 4.49*   < > 2.78*   < > 4.01* 4.68* 2.80* 3.89* 2.74*  CALCIUM 7.7*   < > 7.7*   < > 7.7* 8.0* 7.5* 7.6* 7.7*  MG  --   --   --   --   --   --   --   --  2.1  PHOS 4.0  --  2.0*  --  3.6  --   --   --  3.9   < > = values in this interval not displayed.   GFR: Estimated Creatinine Clearance: 38.3 mL/min (A) (by C-G formula based on SCr of 2.74 mg/dL (H)). Liver Function Tests: Recent Labs  Lab 03/15/19 0241 03/16/19 0135 03/17/19 0259 03/21/19 0146  AST  --   --   --  16  ALT  --   --   --  9  ALKPHOS  --   --   --  122  BILITOT  --   --   --  0.4  PROT  --   --   --  6.2*  ALBUMIN 1.5* 1.6* 1.5* 1.8*   No results for input(s): LIPASE, AMYLASE in the last 168 hours. No results for input(s): AMMONIA in the last 168 hours. Coagulation Profile: Recent  Labs  Lab 03/18/19 0559  INR 1.2    Cardiac Enzymes: No results for input(s): CKTOTAL, CKMB, CKMBINDEX, TROPONINI in the last 168 hours. BNP (last 3 results) No results for input(s): PROBNP in the last 8760 hours. HbA1C: No results for input(s): HGBA1C in the last 72 hours. CBG: Recent Labs  Lab 03/20/19 2339 03/21/19 0437 03/21/19 0814 03/21/19 1151 03/21/19 1618  GLUCAP 152* 116* 153* 86 147*   Lipid Profile: No results for input(s): CHOL, HDL, LDLCALC, TRIG, CHOLHDL, LDLDIRECT in the last 72 hours. Thyroid Function Tests: No results for input(s): TSH, T4TOTAL, FREET4, T3FREE, THYROIDAB in the last 72 hours. Anemia Panel: No results for input(s): VITAMINB12, FOLATE, FERRITIN, TIBC, IRON, RETICCTPCT in the last 72 hours. Sepsis Labs: No results for input(s): PROCALCITON, LATICACIDVEN in the last 168 hours.  Recent Results (from the past 240 hour(s))  Blood Culture (routine x 2)     Status: None   Collection Time: 03/12/19  9:21 PM   Specimen: BLOOD  Result Value Ref Range Status   Specimen Description BLOOD RIGHT ANTECUBITAL  Final   Special Requests   Final    BOTTLES DRAWN AEROBIC AND ANAEROBIC Blood Culture adequate volume   Culture   Final    NO GROWTH 5 DAYS Performed at [facility], [address], [place], [place] [address]    Report Status 03/17/2019 FINAL  Final  Blood Culture (routine x 2)     Status: None   Collection Time: 03/12/19  9:21 PM   Specimen: BLOOD RIGHT HAND  Result Value Ref Range Status   Specimen Description BLOOD RIGHT HAND  Final   Special Requests   Final    BOTTLES DRAWN AEROBIC AND ANAEROBIC Blood Culture adequate volume   Culture   Final    NO GROWTH 5 DAYS Performed at [facility], [place] [address] [place], [place] [address]    Report Status 03/17/2019 FINAL  Final  Urine culture     Status: Abnormal   Collection Time: 03/13/19  7:24 AM   Specimen: In/Out Cath Urine  Result Value Ref Range Status   Specimen Description IN/OUT CATH URINE  Final    Special Requests   Final    NONE Performed at [facility], [place] [address] [place], [place] [address]    Culture MULTIPLE SPECIES PRESENT, SUGGEST RECOLLECTION (A)  Final   Report Status 03/14/2019 FINAL  Final  Aerobic/Anaerobic Culture (surgical/deep wound)     Status: None   Collection Time: 03/14/19 11:01 AM   Specimen: Abscess  Result Value Ref Range Status   Specimen Description ABSCESS  Final   Special Requests NONE  Final   Gram Stain   Final    ABUNDANT WBC PRESENT, PREDOMINANTLY PMN NO ORGANISMS SEEN    Culture   Final    MODERATE KLEBSIELLA PNEUMONIAE NO ANAEROBES ISOLATED Performed at [facility], [place] [address], [place], [place] [address]    Report Status 03/19/2019 FINAL  Final   Organism ID, Bacteria KLEBSIELLA PNEUMONIAE  Final      Susceptibility   Klebsiella pneumoniae - MIC*    AMPICILLIN >=32 RESISTANT Resistant     CEFAZOLIN 16 SENSITIVE Sensitive     CEFEPIME <=0.12 SENSITIVE Sensitive     CEFTAZIDIME <=1 SENSITIVE Sensitive     CEFTRIAXONE <=0.25 SENSITIVE Sensitive     CIPROFLOXACIN 1 SENSITIVE Sensitive     GENTAMICIN 8 INTERMEDIATE Intermediate     IMIPENEM 1 SENSITIVE Sensitive  TRIMETH/SULFA >=320 RESISTANT Resistant     AMPICILLIN/SULBACTAM >=32 RESISTANT Resistant     PIP/TAZO 64 INTERMEDIATE Intermediate     * MODERATE KLEBSIELLA PNEUMONIAE  SARS CORONAVIRUS 2 (TAT 6-24 HRS) Nasopharyngeal Nasopharyngeal Swab     Status: None   Collection Time: 03/19/19  6:50 PM   Specimen: Nasopharyngeal Swab  Result Value Ref Range Status   SARS Coronavirus 2 NEGATIVE NEGATIVE Final    Comment: (NOTE) SARS-CoV-2 target nucleic acids are NOT DETECTED. The SARS-CoV-2 RNA is generally detectable in upper and lower respiratory specimens during the acute phase of infection. Negative results do not preclude SARS-CoV-2 infection, do not rule out co-infections with other pathogens, and should not be used as the sole basis for treatment or other  patient management decisions. Negative results must be combined with clinical observations, patient history, and epidemiological information. The expected result is Negative. Fact Sheet for Patients: SugarRoll.be Fact Sheet for Healthcare Providers: https://www.woods-mathews.com/ This test is not yet approved or cleared by the [place] FDA and  has been authorized for detection and/or diagnosis of SARS-CoV-2 by FDA under an Emergency Use Authorization (EUA). This EUA will remain  in effect (meaning this test can be used) for the duration of the COVID-19 declaration under Section 56 4(b)(1) of the Act, 21 U.S.C. section 360bbb-3(b)(1), unless the authorization is terminated or revoked sooner. Performed at [facility], [place] [address] [place], [place] [address]      RN Pressure Injury Documentation and I am in current Agreement with RN's Assessment Pressure Injury 04/21/18 Stage II -  Partial thickness loss of dermis presenting as a shallow open ulcer with a red, pink wound bed without slough. redness, possible previous injury (Active)  04/21/18 2200  Location: Coccyx  Location Orientation: Medial  Staging: Stage II -  Partial thickness loss of dermis presenting as a shallow open ulcer with a red, pink wound bed without slough.  Wound Description (Comments): redness, possible previous injury  Present on Admission: Yes   Estimated body mass index is 25.08 kg/m as calculated from the following:   Height as of this encounter: 6' 2"  (1.88 m).   Weight as of this encounter: 88.6 kg.  Malnutrition Type:      Malnutrition Characteristics:      Nutrition Interventions:       Radiology Studies: No results found.  Scheduled Meds: . allopurinol  100 mg Oral Daily  . carbamazepine  900 mg Oral BID  . Chlorhexidine Gluconate Cloth  6 each Topical Q0600  . [START ON 03/22/2019] darbepoetin (ARANESP) injection - DIALYSIS  150  mcg Intravenous Q Fri-HD  . feeding supplement (NEPRO CARB STEADY)  237 mL Oral TID BM  . ferrous sulfate  325 mg Oral Q breakfast  . FLUoxetine  20 mg Oral Daily  . insulin aspart  0-5 Units Subcutaneous QHS  . insulin aspart  0-9 Units Subcutaneous TID WC  . insulin aspart  2 Units Subcutaneous TID WC  . insulin detemir  10 Units Subcutaneous Daily  . lactulose  20 g Oral BID  . multivitamin with minerals  1 tablet Oral QHS  . pantoprazole  40 mg Oral Daily  . PHENobarbital  243 mg Oral QHS  . simvastatin  20 mg Oral QHS  . sodium chloride flush  5 mL Intracatheter Q8H  . tamsulosin  0.4 mg Oral QHS   Continuous Infusions: . meropenem (MERREM) IV 500 mg (03/20/19 2226)    LOS: 8 days   Georgina Quint  Lise Auer, DO [facility] PAGER is on AMION  If 7PM-7AM, please contact night-coverage [email or web address]

## 2019-03-21 NOTE — Progress Notes (Signed)
[facility] Progress Note   Subjective:  Seen in room - very lethargic/hard to wake this morning. Spoke with his RN who reports that was previously awake and ordering breakfast - often hard to wake from sleep. She will monitor him. She also reports one if his JP drains is malfunctioning - directed to call IR to assess.  Objective Vitals:   03/20/19 1115 03/20/19 1208 03/20/19 2149 03/21/19 0656  BP: 125/73 123/73 128/76 115/74  Pulse: 76 84 73 65  Resp: 17 18 16 16   Temp: 97.9 F (36.6 C) 98.1 F (36.7 C) 97.9 F (36.6 C) (!) 97.5 F (36.4 C)  TempSrc: Oral Oral Oral Oral  SpO2: 99% 100% 100% 100%  Weight: 88.6 kg     Height:       Physical Exam General:Chronically ill appearing man, lethargic today. Diffuse dry skin Heart:RRR; no murmur Lungs:CTAB Abdomen:soft, non-tender. Two JP drains to L flank - serosang output present Extremities:No LE edema Dialysis Access:TDC in R chest, maturing RUE AVF + thrill  Additional Objective Labs: Basic Metabolic Panel: Recent Labs  Lab 03/16/19 0135 03/16/19 1443 03/17/19 0259 03/18/19 0559 03/19/19 0122 03/20/19 0152 03/21/19 0146  NA 137   < > 137   < > 138 135 134*  K 2.7*   < > 3.0*   < > 3.5 3.7 3.4*  CL 101   < > 102   < > 105 104 99  CO2 25   < > 25   < > 25 21* 26  GLUCOSE 188*   < > 250*   < > 154* 283* 140*  BUN 16   < > 33*   < > 25* 41* 28*  CREATININE 2.78*   < > 4.01*   < > 2.80* 3.89* 2.74*  CALCIUM 7.7*   < > 7.7*   < > 7.5* 7.6* 7.7*  PHOS 2.0*  --  3.6  --   --   --  3.9   < > = values in this interval not displayed.   Liver Function Tests: Recent Labs  Lab 03/16/19 0135 03/17/19 0259 03/21/19 0146  AST  --   --  16  ALT  --   --  9  ALKPHOS  --   --  122  BILITOT  --   --  0.4  PROT  --   --  6.2*  ALBUMIN 1.6* 1.5* 1.8*   CBC: Recent Labs  Lab 03/16/19 0135 03/16/19 0135 03/18/19 0559 03/18/19 0559 03/19/19 0122 03/20/19 0152 03/21/19 0146  WBC 5.3   < > 3.7*   <  > 4.5 4.1 5.9  NEUTROABS  --   --  2.3  --   --   --  3.9  HGB 8.0*   < > 8.0*   < > 8.1* 8.4* 8.4*  HCT 25.2*   < > 25.2*   < > 25.3* 26.4* 25.9*  MCV 94.0  --  95.8  --  93.7 94.3 92.8  PLT 184   < > 133*   < > 154 149* 146*   < > = values in this interval not displayed.   Blood Culture    Component Value Date/Time   SDES ABSCESS 03/14/2019 1101   SPECREQUEST NONE 03/14/2019 1101   CULT  03/14/2019 1101    MODERATE KLEBSIELLA PNEUMONIAE NO ANAEROBES ISOLATED Performed at [facility], [place] [address], [place], [place] [address]    REPTSTATUS 03/19/2019 FINAL 03/14/2019 1101  Medications: . meropenem (MERREM) IV 500 mg (03/20/19 2226)   . allopurinol  100 mg Oral Daily  . carbamazepine  900 mg Oral BID  . Chlorhexidine Gluconate Cloth  6 each Topical Q0600  . feeding supplement (NEPRO CARB STEADY)  237 mL Oral TID BM  . ferrous sulfate  325 mg Oral Q breakfast  . FLUoxetine  20 mg Oral Daily  . insulin aspart  0-5 Units Subcutaneous QHS  . insulin aspart  0-9 Units Subcutaneous TID WC  . insulin aspart  2 Units Subcutaneous TID WC  . insulin detemir  10 Units Subcutaneous Daily  . lactulose  20 g Oral BID  . multivitamin with minerals  1 tablet Oral QHS  . pantoprazole  40 mg Oral Daily  . PHENobarbital  243 mg Oral QHS  . simvastatin  20 mg Oral QHS  . sodium chloride flush  5 mL Intracatheter Q8H  . tamsulosin  0.4 mg Oral QHS    Dialysis Orders: MWF at [facility] 4:15hr, 400/800, EDW 94kg, 3K/2.25Ca, TDC, heparin 3000 bolus - Mircera 147mg IV q 2 weeks (2/10)  Assessment/Plan: 1. 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 Urology consulted - not felt to be candidate for open wash-out,S/pIR drains and Meropenem. IDsaw and recommend removalTDC for line holiday - [facility] Per ID - 2 more weeks of Mero (thru 3/4) then Cefepime q HD for another 2 weeks. 2. ESRD:Will continue HD on MWF  schedule-next tomorrow 2/26. 3. Hypokalemia: Improved - using 3-4K bath with HD. 4. Hypertension/volume:BPstable- well under EDW per weights, will get standing weight if able. 5. Anemia:Hgb8.4- will resume ESA tomorrow with HD (Aranesp 150). 6. Metabolic bone disease:CorrCa/Phos ok- binders held as of 2/20 - follow. 7. T2DM:Per primary. 8. R kidney stone: Per CT - non-obstructing,asymptomatic 9.  Hx seizure disorder 10. Liver disease: Lactulose BID. 11. Nutrition: Alb remains low - continue Nepro + high protein diet.  [name] [name] 03/21/2019, 10:44 AM  [name]

## 2019-03-22 ENCOUNTER — Inpatient Hospital Stay (HOSPITAL_COMMUNITY): Payer: Medicare Other

## 2019-03-22 HISTORY — PX: IR FLUORO GUIDE CV LINE RIGHT: IMG2283

## 2019-03-22 HISTORY — PX: IR US GUIDE VASC ACCESS RIGHT: IMG2390

## 2019-03-22 LAB — CBC WITH DIFFERENTIAL/PLATELET
Abs Immature Granulocytes: 0.03 10*3/uL (ref 0.00–0.07)
Basophils Absolute: 0 10*3/uL (ref 0.0–0.1)
Basophils Relative: 1 %
Eosinophils Absolute: 0.1 10*3/uL (ref 0.0–0.5)
Eosinophils Relative: 2 %
HCT: 25.2 % — ABNORMAL LOW (ref 39.0–52.0)
Hemoglobin: 8.1 g/dL — ABNORMAL LOW (ref 13.0–17.0)
Immature Granulocytes: 1 %
Lymphocytes Relative: 29 %
Lymphs Abs: 1.3 10*3/uL (ref 0.7–4.0)
MCH: 30.2 pg (ref 26.0–34.0)
MCHC: 32.1 g/dL (ref 30.0–36.0)
MCV: 94 fL (ref 80.0–100.0)
Monocytes Absolute: 0.3 10*3/uL (ref 0.1–1.0)
Monocytes Relative: 6 %
Neutro Abs: 2.7 10*3/uL (ref 1.7–7.7)
Neutrophils Relative %: 61 %
Platelets: 135 10*3/uL — ABNORMAL LOW (ref 150–400)
RBC: 2.68 MIL/uL — ABNORMAL LOW (ref 4.22–5.81)
RDW: 16.4 % — ABNORMAL HIGH (ref 11.5–15.5)
WBC: 4.3 10*3/uL (ref 4.0–10.5)
nRBC: 0 % (ref 0.0–0.2)

## 2019-03-22 LAB — MAGNESIUM: Magnesium: 2.6 mg/dL — ABNORMAL HIGH (ref 1.7–2.4)

## 2019-03-22 LAB — GLUCOSE, CAPILLARY
Glucose-Capillary: 163 mg/dL — ABNORMAL HIGH (ref 70–99)
Glucose-Capillary: 178 mg/dL — ABNORMAL HIGH (ref 70–99)
Glucose-Capillary: 369 mg/dL — ABNORMAL HIGH (ref 70–99)
Glucose-Capillary: 178 mg/dL — ABNORMAL HIGH (ref 70–99)

## 2019-03-22 LAB — SARS CORONAVIRUS 2 (TAT 6-24 HRS): SARS Coronavirus 2: NEGATIVE

## 2019-03-22 LAB — PHOSPHORUS: Phosphorus: 6.6 mg/dL — ABNORMAL HIGH (ref 2.5–4.6)

## 2019-03-22 MED ORDER — DARBEPOETIN ALFA 150 MCG/0.3ML IJ SOSY
PREFILLED_SYRINGE | INTRAMUSCULAR | Status: AC
  Filled 2019-03-22: qty 0.3

## 2019-03-22 MED ORDER — HEPARIN SODIUM (PORCINE) 1000 UNIT/ML IJ SOLN
INTRAMUSCULAR | Status: AC
  Administered 2019-03-22: 4000 [IU]
  Filled 2019-03-22: qty 4

## 2019-03-22 MED ORDER — LIDOCAINE HCL 1 % IJ SOLN
INTRAMUSCULAR | Status: AC
  Filled 2019-03-22: qty 20

## 2019-03-22 MED ORDER — LIDOCAINE HCL 1 % IJ SOLN
INTRAMUSCULAR | Status: DC | PRN
  Administered 2019-03-22: 5 mL

## 2019-03-22 MED ORDER — CHLORHEXIDINE GLUCONATE 4 % EX LIQD
CUTANEOUS | Status: AC
  Filled 2019-03-22: qty 15

## 2019-03-22 MED ORDER — CHLORHEXIDINE GLUCONATE 4 % EX LIQD
CUTANEOUS | Status: DC | PRN
  Administered 2019-03-22: 1 via TOPICAL

## 2019-03-22 NOTE — Progress Notes (Signed)
Pt transfer to IR for PICC line placement, no s/s of distress noted.

## 2019-03-22 NOTE — Procedures (Signed)
Interventional Radiology Procedure Note  Procedure: Placement of a right IJ approach single lumen cuffed central venous catheter.  Tip is positioned at the superior cavoatrial junction and catheter is ready for immediate use.  Complications: None Recommendations:  - Ok to use - Do not submerge - Routine line care   Signed,  Dulcy Fanny. Earleen Newport, DO

## 2019-03-22 NOTE — Progress Notes (Signed)
PT Cancellation Note  Patient Details Name: Eddie Davies MRN: [id number] DOB: 08/09/1970   Cancelled Treatment:    Reason Eval/Treat Not Completed: Patient declined, no reason specified;Other (comment). Pt refusing to participate. PT signing off at this time as pt has refused multiple times on multiple days. Please re-consult if pt is ever willing to participate in therapy while admitted.    [place] 03/22/2019, 4:24 PM

## 2019-03-22 NOTE — Progress Notes (Signed)
Pt transfer to dialysis, alert and oriented ,no s/s of distress.

## 2019-03-22 NOTE — Progress Notes (Signed)
 [facility] Progress Note   Subjective: Seen on HD, doesn't feel well.   Objective Vitals:   03/22/19 0820 03/22/19 0825 03/22/19 0845 03/22/19 0900  BP: 122/68 125/73 135/76 121/74  Pulse: (!) 59 (!) 56 62 60  Resp: 13 14 14 13   Temp: 97.9 F (36.6 C)     TempSrc: Oral     SpO2: 95%     Weight: 89.7 kg     Height:       Physical Exam General: Chronically ill appearing male in NAD Skin: Dry, scaling skin, poor turgor.  Heart: S1,S2 RRR Lungs: CTAB A/P Abdomen: soft, non-distended. JP drains X 2-serosanganius drainage.  Extremities: no LE edema.  Dialysis Access: Maturing L AVF Strong bruit. RIJ TDC positional.     Additional Objective Labs: Basic Metabolic Panel: Recent Labs  Lab 03/17/19 0259 03/18/19 0559 03/20/19 0152 03/21/19 0146 03/22/19 0257  NA 137   < > 135 134* 136  K 3.0*   < > 3.7 3.4* 3.7  CL 102   < > 104 99 100  CO2 25   < > 21* 26 22  GLUCOSE 250*   < > 283* 140* 159*  BUN 33*   < > 41* 28* 47*  CREATININE 4.01*   < > 3.89* 2.74* 3.97*  CALCIUM 7.7*   < > 7.6* 7.7* 7.9*  PHOS 3.6  --   --  3.9 6.6*   < > = values in this interval not displayed.   Liver Function Tests: Recent Labs  Lab 03/17/19 0259 03/21/19 0146 03/22/19 0257  AST  --  16 24  ALT  --  9 8  ALKPHOS  --  122 111  BILITOT  --  0.4 0.6  PROT  --  6.2* 5.9*  ALBUMIN 1.5* 1.8* 1.8*   No results for input(s): LIPASE, AMYLASE in the last 168 hours. CBC: Recent Labs  Lab 03/18/19 0559 03/18/19 0559 03/19/19 0122 03/19/19 0122 03/20/19 0152 03/21/19 0146 03/22/19 0257  WBC 3.7*   < > 4.5   < > 4.1 5.9 4.3  NEUTROABS 2.3  --   --   --   --  3.9 2.7  HGB 8.0*   < > 8.1*   < > 8.4* 8.4* 8.1*  HCT 25.2*   < > 25.3*   < > 26.4* 25.9* 25.2*  MCV 95.8  --  93.7  --  94.3 92.8 94.0  PLT 133*   < > 154   < > 149* 146* 135*   < > = values in this interval not displayed.   Blood Culture    Component Value Date/Time   SDES ABSCESS 03/14/2019 1101   SPECREQUEST NONE 03/14/2019 1101   CULT  03/14/2019 1101    MODERATE KLEBSIELLA PNEUMONIAE NO ANAEROBES ISOLATED Performed at [facility], [place] [address] [place], [place] [address]    REPTSTATUS 03/19/2019 FINAL 03/14/2019 1101    Cardiac Enzymes: No results for input(s): CKTOTAL, CKMB, CKMBINDEX, TROPONINI in the last 168 hours. CBG: Recent Labs  Lab 03/21/19 0814 03/21/19 1151 03/21/19 1618 03/21/19 2002 03/22/19 0630  GLUCAP 153* 86 147* 166* 163*   Iron Studies: No results for input(s): IRON, TIBC, TRANSFERRIN, FERRITIN in the last 72 hours. @lablastinr3 @ Studies/Results: No results found. Medications: . meropenem (MERREM) IV 500 mg (03/21/19 2130)   . allopurinol  100 mg Oral Daily  . carbamazepine  900 mg Oral BID  . Chlorhexidine Gluconate Cloth  6 each Topical Q0600  .  darbepoetin (ARANESP) injection - DIALYSIS  150 mcg Intravenous Q Fri-HD  . feeding supplement (NEPRO CARB STEADY)  237 mL Oral TID BM  . ferrous sulfate  325 mg Oral Q breakfast  . FLUoxetine  20 mg Oral Daily  . insulin aspart  0-5 Units Subcutaneous QHS  . insulin aspart  0-9 Units Subcutaneous TID WC  . insulin aspart  2 Units Subcutaneous TID WC  . insulin detemir  10 Units Subcutaneous Daily  . lactulose  20 g Oral BID  . multivitamin with minerals  1 tablet Oral QHS  . pantoprazole  40 mg Oral Daily  . PHENobarbital  243 mg Oral QHS  . simvastatin  20 mg Oral QHS  . sodium chloride flush  5 mL Intracatheter Q8H  . tamsulosin  0.4 mg Oral QHS     Dialysis Orders: MWF at [facility] 4:15hr, 400/800, EDW 94kg, 3K/2.25Ca, TDC,  -heparin 3000 units IV TIW - Mircera 19mg IV q 2 weeks (2/10)  Assessment/Plan: 1. 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 Urology consulted - not felt to be candidate for open wash-out,S/pIR drains and Meropenem. IDsaw and recommend removalTDC for line holiday - [facility] Per  ID - 2 more weeks of Mero (thru 3/4) then Cefepime q HD for another 2 weeks. 2. ESRD:Will continue HD on MWF schedule-Tolerating HD without issues. 3. Hypokalemia: K+ 3.7 using 4.0 K bath.  4. Hypertension/volume:BPstable- well under EDW per weights. Pre wt . 89.7 kg attempting Net UF 1.5. Says he can't stand up for weights.  5. Anemia:Hgb8.1- will resume ESA today with HD (Aranesp 150 mcg IV). 6. Metabolic bone disease:CorrCa/Phos ok- binders held as of 2/20 - follow. 7. T2DM:Per primary. 8. R kidney stone: Per CT - non-obstructing,asymptomatic 9. Hx seizure disorder 10. Liver disease:Lactulose BID. 11. Nutrition: Alb remains low - continue Nepro + high protein diet.   H.  NP-C 03/22/2019, 9:36 AM  [name]

## 2019-03-22 NOTE — Progress Notes (Signed)
PROGRESS NOTE    Eddie Davies  WUJ:[id number] DOB: 05-25-1970 DOA: 03/12/2019 PCP: [facility], [facility]  Brief Narrative:  Eddie Nash Phillipsis a 49 y.o.malewith a history of ESRD on HD, ESBLwho was admitted early this morning by Dr. Nilda Simmer retroperitoneal abscess from SNF.He was recently admitted for left-sided pyelonephritis and sepsis status post left-sided radical nephrectomy and right double-J stent placement at OSH who presented with N/V/fever. Has since been evaluated by nephrology and patient underwent HD and has been dialyzing.   From H&P: Patient has been battling K.Pneumo for past several months, recent admits include:  11/3-11/11/2018 at [facility] for sepsis related to L sided pyelo  12/26/2018-01/29/2019 Admission to [facility] for sepsis, concern for dialysis cath infection, klebsiella bacteremia, left-sided emphysematous pyelonephritis with right renal pelvis stone, left-sided radical nephrectomy and right stent performed on 01/09/2019, subsequently in ICU on vasopressors, + Covid test on 01/03/2019, uncontrolled blood sugars without DKA  01/12-01/20/21 Admission to [facility] for retroperitoneal fluid collection, IV abx with IR drain placement 02/06/19, with improvement on repeat imaging  03/07/2019 R ureteral stent removal.  2/18: Status post CT-guided drain placement x2.  ID consulted. 2/19: Tunneled HD catheter removed via IR 2/22: R IJ TDC placed by IR  OPAT IV Abx orders have been pended and IR 3 times daily flushes with 5 cc of normal saline and recording drain output every shift dressing changes every day or when soiled and they recommend not tender to suction bulbs.  Evaluated and recommending continuing empiric meropenem.  Patient underwent a right IJ PICC today on 03/22/2019 after his dialysis session for his maintenance of antibiotics for outpatient setting.  Patient states that he did not feel well today and nephrology was  concerned about his abscess so wanted reimaging of his abdomen to evaluate his abscess was CT of the abdomen pelvis was obtained and currently being done.  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 Will need a separate line for this prior to discharge and will order a Midline if ok with Nephrology  -Interventional radiology evaluated and output continues to be significantly least 20 to 30 mL/day in the drains -Patient is not feeling well and given the concern we will repeat a CT of the abdomen pelvis to evaluate his his abscess -Patient is getting close to being discharged soon to SNF and anticipating discharging in the next 24 to 48 hours; Repeat SARS-CoV-2 Test negative  -Continues to have Back Pain so we will be repeating his CT of the abdomen pelvis to reassess his drains as well and as well as his abscess -Underwent a right IJ central venous catheter  with a single-lumen for his IV meropenem for his discharge planning -follow-up on CT of the abdomen pelvis  Hypokalemia  -Potassium yesterday morning was 3.4 and today is 3.7 -Continue monitor and replete as  necessary -Repeat CMP in a.m.  ESLD on lactulose -Checked LFTs and are within normal limits as his AST is 24 and his ALT is 8  ESRD on HD M, W, F -HD the day before yesterday and he is dialyzing today -Patient's BUN/Cr this a.m. prior to dialysis was 47/3.97 -Continue to Monitor and Trend Renal Fxn -Repeat CMP In AM    Seizure disorder  -continue Tegretol and phenobarbital  Diabetes -C/w Levemir to 10 units daily, continue sliding scale and adjust; Now change q4h SSI to AC/HS and added 2 units Novolog TIDwm -CBG's ranging from 867-619  Metabolic bone disease  -C/w PhosLo  Normocytic Anemia/Anemia of CKD -Was given ESA as outpatient -Patient's Hgb/Hct is stable at 8.4/25.9 yesterday and slightly dropped today to 8.1/5.2 -Continue to Monitor for S/Sx of Bleeding; Currently no Overt Bleeding noted -Repeat CBC in AM   Hypertension  -continue current regimen and volume removal per nephro  Chronic nonobstructing right urolithiasis -stable by last CT but will repeat today given his continued fatigue and back pain -Follow up outpatient with Urology   Hyperlipidemia  -On statin  Thrombocytopenia -Mild as Platelet Count is now 135 and slightly down from yesterday from 146 -Continue to Monitor for S/Sx of Bleeding; Currently no Overt Bleeding noted -Repeat CBC in AM   DVT prophylaxis: Heparin 5,000 units sq q8h Code Status: FULL CODE  Family Communication: No family present at bedside  Disposition Plan: Anticipate D/C back to SNF in the next 24-48 hours if he is medically stable however because of his fatigue will reassess his CT of the abdomen pelvis to evaluate his abscesses and will need nephrology clearance as well as IR clearance prior to discharge;  Repeat Covid testing is negative  Consultants:   Nephrology  Urology  Interventional Radiology   Infectious Diseases    Procedures:  2/17 HD, on MWF schedule 2/18 CT-guided drain placement x2 2/19 HD catheter  removal 2/22 R IJ tunneled dialysis catheter placed by IR  Antimicrobials:  Anti-infectives (From admission, onward)   Start     Dose/Rate Route Frequency Ordered Stop   03/18/19 1015  ceFAZolin (ANCEF) IVPB 2g/100 mL premix  Status:  Discontinued     2 g 200 mL/hr over 30 Minutes Intravenous  Once 03/18/19 1006 03/18/19 1128   03/17/19 2200  meropenem (MERREM) 500 mg in sodium chloride 0.9 % 100 mL IVPB     500 mg 200 mL/hr over 30 Minutes Intravenous Every 24 hours 03/17/19 1608     03/13/19 2200  meropenem (MERREM) 500 mg in sodium chloride 0.9 % 100 mL IVPB  Status:  Discontinued     500 mg 200 mL/hr over 30 Minutes Intravenous Every 24 hours 03/12/19 2210 03/17/19 1550   03/12/19 2215  piperacillin-tazobactam (ZOSYN) IVPB 3.375 g  Status:  Discontinued     3.375 g 12.5 mL/hr over 240 Minutes Intravenous  Once 03/12/19 2205 03/12/19 2208   03/12/19 2215  meropenem (MERREM) 1 g in sodium chloride 0.9 % 100 mL IVPB     1 g 200 mL/hr over 30 Minutes Intravenous  Once 03/12/19 2210 03/13/19 0223     Subjective: Seen and examined at bedside in dialysis and he still complaining of pain and he did not feel well.  He did not  give me much of a subjective history and wanted to rest.  Unclear what his actual cognitive baseline is but he did not interact with me very much today and could be from his lethargy and fatigue.  Objective: Vitals:   03/22/19 1130 03/22/19 1200 03/22/19 1225 03/22/19 1330  BP: 116/71 108/63 109/62 110/64  Pulse: 74 81 82 87  Resp:   14 17  Temp:   97.9 F (36.6 C) 98.2 F (36.8 C)  TempSrc:   Oral Oral  SpO2:   95% 100%  Weight:  88.2 kg    Height:        Intake/Output Summary (Last 24 hours) at 03/22/2019 1854 Last data filed at 03/22/2019 1445 Gross per 24 hour  Intake 600 ml  Output 1150 ml  Net -550 ml   Filed Weights   03/20/19 1115 03/22/19 0820 03/22/19 1200  Weight: 88.6 kg 89.7 kg 88.2 kg   Examination: Physical Exam:  Constitutional:  WN/WD overweight Caucasian male who is chronically ill-appearing and laying in the dialysis bed and no apparent distress but does appear uncomfortable and depressed and fatigued.   Eyes: Lids and conjunctivae normal, sclerae anicteric  ENMT: External Ears, Nose appear normal. Grossly normal hearing.  Neck: Appears normal, supple, no cervical masses, normal ROM, no appreciable thyromegaly; no JVD Respiratory: Diminished to auscultation bilaterally, no wheezing, rales, rhonchi or crackles. Normal respiratory effort and patient is not tachypenic. No accessory muscle use.  Labored breathing Cardiovascular: RRR, no murmurs / rubs / gallops. S1 and S2 auscultated.  Abdomen: Soft, tender, distended secondary body habitus. Bowel sounds positive.  GU: Deferred.  Has 2 JP drains coming from his back and one is more bloody than the other Musculoskeletal: No clubbing / cyanosis of digits/nails. No joint deformity upper and lower extremities. Good ROM, no contractures. Normal strength and muscle tone.  Skin: No rashes, lesions, ulcers or limited skin evaluation. No induration; Warm and dry.  Neurologic: CN 2-12 grossly intact with no focal deficits. Romberg sign and cerebellar reflexes not assessed.  Psychiatric: Normal judgment and insight. Somnolent and slightly drowsy.  Withdrawn and depressed appearing mood and flat affect  Data Reviewed: I have personally reviewed following labs and imaging studies  CBC: Recent Labs  Lab 03/18/19 0559 03/19/19 0122 03/20/19 0152 03/21/19 0146 03/22/19 0257  WBC 3.7* 4.5 4.1 5.9 4.3  NEUTROABS 2.3  --   --  3.9 2.7  HGB 8.0* 8.1* 8.4* 8.4* 8.1*  HCT 25.2* 25.3* 26.4* 25.9* 25.2*  MCV 95.8 93.7 94.3 92.8 94.0  PLT 133* 154 149* 146* 175*   Basic Metabolic Panel: Recent Labs  Lab 03/16/19 0135 03/16/19 1443 03/17/19 0259 03/17/19 0259 03/18/19 0559 03/19/19 0122 03/20/19 0152 03/21/19 0146 03/22/19 0257  NA 137   < > 137   < > 137 138 135 134* 136  K  2.7*   < > 3.0*   < > 3.8 3.5 3.7 3.4* 3.7  CL 101   < > 102   < > 103 105 104 99 100  CO2 25   < > 25   < > 21* 25 21* 26 22  GLUCOSE 188*   < > 250*   < > 163* 154* 283* 140* 159*  BUN 16   < > 33*   < > 47* 25* 41* 28* 47*  CREATININE 2.78*   < > 4.01*   < > 4.68* 2.80* 3.89* 2.74* 3.97*  CALCIUM 7.7*   < > 7.7*   < >  8.0* 7.5* 7.6* 7.7* 7.9*  MG  --   --   --   --   --   --   --  2.1 2.6*  PHOS 2.0*  --  3.6  --   --   --   --  3.9 6.6*   < > = values in this interval not displayed.   GFR: Estimated Creatinine Clearance: 26.5 mL/min (A) (by C-G formula based on SCr of 3.97 mg/dL (H)). Liver Function Tests: Recent Labs  Lab 03/16/19 0135 03/17/19 0259 03/21/19 0146 03/22/19 0257  AST  --   --  16 24  ALT  --   --  9 8  ALKPHOS  --   --  122 111  BILITOT  --   --  0.4 0.6  PROT  --   --  6.2* 5.9*  ALBUMIN 1.6* 1.5* 1.8* 1.8*   No results for input(s): LIPASE, AMYLASE in the last 168 hours. No results for input(s): AMMONIA in the last 168 hours. Coagulation Profile: Recent Labs  Lab 03/18/19 0559  INR 1.2   Cardiac Enzymes: No results for input(s): CKTOTAL, CKMB, CKMBINDEX, TROPONINI in the last 168 hours. BNP (last 3 results) No results for input(s): PROBNP in the last 8760 hours. HbA1C: No results for input(s): HGBA1C in the last 72 hours. CBG: Recent Labs  Lab 03/21/19 1618 03/21/19 2002 03/22/19 0630 03/22/19 1324 03/22/19 1625  GLUCAP 147* 166* 163* 178* 369*   Lipid Profile: No results for input(s): CHOL, HDL, LDLCALC, TRIG, CHOLHDL, LDLDIRECT in the last 72 hours. Thyroid Function Tests: No results for input(s): TSH, T4TOTAL, FREET4, T3FREE, THYROIDAB in the last 72 hours. Anemia Panel: No results for input(s): VITAMINB12, FOLATE, FERRITIN, TIBC, IRON, RETICCTPCT in the last 72 hours. Sepsis Labs: No results for input(s): PROCALCITON, LATICACIDVEN in the last 168 hours.  Recent Results (from the past 240 hour(s))  Blood Culture (routine x 2)      Status: None   Collection Time: 03/12/19  9:21 PM   Specimen: BLOOD  Result Value Ref Range Status   Specimen Description BLOOD RIGHT ANTECUBITAL  Final   Special Requests   Final    BOTTLES DRAWN AEROBIC AND ANAEROBIC Blood Culture adequate volume   Culture   Final    NO GROWTH 5 DAYS Performed at [facility], [address], [place], [place] [address]    Report Status 03/17/2019 FINAL  Final  Blood Culture (routine x 2)     Status: None   Collection Time: 03/12/19  9:21 PM   Specimen: BLOOD RIGHT HAND  Result Value Ref Range Status   Specimen Description BLOOD RIGHT HAND  Final   Special Requests   Final    BOTTLES DRAWN AEROBIC AND ANAEROBIC Blood Culture adequate volume   Culture   Final    NO GROWTH 5 DAYS Performed at [facility], [place] [address] [place], [place] [address]    Report Status 03/17/2019 FINAL  Final  Urine culture     Status: Abnormal   Collection Time: 03/13/19  7:24 AM   Specimen: In/Out Cath Urine  Result Value Ref Range Status   Specimen Description IN/OUT CATH URINE  Final   Special Requests   Final    NONE Performed at [facility], [place] [address], [place], [place] [address]    Culture MULTIPLE SPECIES PRESENT, SUGGEST RECOLLECTION (A)  Final   Report Status 03/14/2019 FINAL  Final  Aerobic/Anaerobic Culture (surgical/deep wound)  Status: None   Collection Time: 03/14/19 11:01 AM   Specimen: Abscess  Result Value Ref Range Status   Specimen Description ABSCESS  Final   Special Requests NONE  Final   Gram Stain   Final    ABUNDANT WBC PRESENT, PREDOMINANTLY PMN NO ORGANISMS SEEN    Culture   Final    MODERATE KLEBSIELLA PNEUMONIAE NO ANAEROBES ISOLATED Performed at [facility], [address], [place], [place] [address]    Report Status 03/19/2019 FINAL  Final   Organism ID, Bacteria KLEBSIELLA PNEUMONIAE  Final      Susceptibility   Klebsiella pneumoniae - MIC*    AMPICILLIN >=32 RESISTANT Resistant      CEFAZOLIN 16 SENSITIVE Sensitive     CEFEPIME <=0.12 SENSITIVE Sensitive     CEFTAZIDIME <=1 SENSITIVE Sensitive     CEFTRIAXONE <=0.25 SENSITIVE Sensitive     CIPROFLOXACIN 1 SENSITIVE Sensitive     GENTAMICIN 8 INTERMEDIATE Intermediate     IMIPENEM 1 SENSITIVE Sensitive     TRIMETH/SULFA >=320 RESISTANT Resistant     AMPICILLIN/SULBACTAM >=32 RESISTANT Resistant     PIP/TAZO 64 INTERMEDIATE Intermediate     * MODERATE KLEBSIELLA PNEUMONIAE  SARS CORONAVIRUS 2 (TAT 6-24 HRS) Nasopharyngeal Nasopharyngeal Swab     Status: None   Collection Time: 03/19/19  6:50 PM   Specimen: Nasopharyngeal Swab  Result Value Ref Range Status   SARS Coronavirus 2 NEGATIVE NEGATIVE Final    Comment: (NOTE) SARS-CoV-2 target nucleic acids are NOT DETECTED. The SARS-CoV-2 RNA is generally detectable in upper and lower respiratory specimens during the acute phase of infection. Negative results do not preclude SARS-CoV-2 infection, do not rule out co-infections with other pathogens, and should not be used as the sole basis for treatment or other patient management decisions. Negative results must be combined with clinical observations, patient history, and epidemiological information. The expected result is Negative. Fact Sheet for Patients: SugarRoll.be Fact Sheet for Healthcare Providers: https://www.woods-mathews.com/ This test is not yet approved or cleared by the [place] FDA and  has been authorized for detection and/or diagnosis of SARS-CoV-2 by FDA under an Emergency Use Authorization (EUA). This EUA will remain  in effect (meaning this test can be used) for the duration of the COVID-19 declaration under Section 56 4(b)(1) of the Act, 21 U.S.C. section 360bbb-3(b)(1), unless the authorization is terminated or revoked sooner. Performed at [facility], [place] [address] [place], [place] [address]   SARS CORONAVIRUS 2 (TAT 6-24 HRS)  Nasopharyngeal Nasopharyngeal Swab     Status: None   Collection Time: 03/22/19 10:28 AM   Specimen: Nasopharyngeal Swab  Result Value Ref Range Status   SARS Coronavirus 2 NEGATIVE NEGATIVE Final    Comment: (NOTE) SARS-CoV-2 target nucleic acids are NOT DETECTED. The SARS-CoV-2 RNA is generally detectable in upper and lower respiratory specimens during the acute phase of infection. Negative results do not preclude SARS-CoV-2 infection, do not rule out co-infections with other pathogens, and should not be used as the sole basis for treatment or other patient management decisions. Negative results must be combined with clinical observations, patient history, and epidemiological information. The expected result is Negative. Fact Sheet for Patients: SugarRoll.be Fact Sheet for Healthcare Providers: https://www.woods-mathews.com/ This test is not yet approved or cleared by the [place] FDA and  has been authorized for detection and/or diagnosis of SARS-CoV-2 by FDA under an Emergency Use Authorization (EUA). This EUA will remain  in effect (meaning this test can  be used) for the duration of the COVID-19 declaration under Section 56 4(b)(1) of the Act, 21 U.S.C. section 360bbb-3(b)(1), unless the authorization is terminated or revoked sooner. Performed at [facility], [place] [address], [place],  [address]      RN Pressure Injury Documentation and I am in current Agreement with RN's Assessment Pressure Injury 04/21/18 Stage II -  Partial thickness loss of dermis presenting as a shallow open ulcer with a red, pink wound bed without slough. redness, possible previous injury (Active)  04/21/18 2200  Location: Coccyx  Location Orientation: Medial  Staging: Stage II -  Partial thickness loss of dermis presenting as a shallow open ulcer with a red, pink wound bed without slough.  Wound Description (Comments): redness, possible  previous injury  Present on Admission: Yes   Radiology Studies: No results found.  Scheduled Meds: . allopurinol  100 mg Oral Daily  . carbamazepine  900 mg Oral BID  . Chlorhexidine Gluconate Cloth  6 each Topical Q0600  . Darbepoetin Alfa      . darbepoetin (ARANESP) injection - DIALYSIS  150 mcg Intravenous Q Fri-HD  . feeding supplement (NEPRO CARB STEADY)  237 mL Oral TID BM  . ferrous sulfate  325 mg Oral Q breakfast  . FLUoxetine  20 mg Oral Daily  . insulin aspart  0-5 Units Subcutaneous QHS  . insulin aspart  0-9 Units Subcutaneous TID WC  . insulin aspart  2 Units Subcutaneous TID WC  . insulin detemir  10 Units Subcutaneous Daily  . lactulose  20 g Oral BID  . lidocaine      . multivitamin with minerals  1 tablet Oral QHS  . pantoprazole  40 mg Oral Daily  . PHENobarbital  243 mg Oral QHS  . simvastatin  20 mg Oral QHS  . sodium chloride flush  5 mL Intracatheter Q8H  . tamsulosin  0.4 mg Oral QHS   Continuous Infusions: . meropenem (MERREM) IV 500 mg (03/21/19 2130)    LOS: 9 days   Kerney Elbe, DO [facility] PAGER is on AMION  If 7PM-7AM, please contact night-coverage [email or web address]

## 2019-03-22 NOTE — Progress Notes (Signed)
Pt for CT abdomen, contrast given 1st bottle.

## 2019-03-22 NOTE — Progress Notes (Signed)
Pt 2nd bottle of contrast completed, CT scan informed.

## 2019-03-22 NOTE — TOC Progression Note (Signed)
Transition of Care [facility]) - Progression Note    Patient Details  Name: Tiernan Suto MRN: [id number] Date of Birth: [birth year]  Transition of Care [facility]) CM/SW [place], [place] Phone Number: 03/22/2019, 11:47 AM  Clinical Narrative:    Confirmed that SNF can complete iv abx at facility. [facility] requesting picc to be placed. MD aware. Pt needs new COVID, MD also aware. [facility] following for weekend discharge.    Expected Discharge Plan: [place] Barriers to Discharge: Continued Medical Work up  Expected Discharge Plan and Services Expected Discharge Plan: [place] In-house Referral: Clinical Social Work Discharge Planning Services: CM Consult Post Acute Care Choice: [place], Resumption of Svcs/PTA Provider Living arrangements for the past 2 months: [place]               Readmission Risk Interventions Readmission Risk Prevention Plan 03/19/2019 11/29/2018 05/28/2018  Transportation Screening Complete Complete -  PCP or Specialist Appt within 5-7 Days - - -  Home Care Screening - - -  Medication Review (RN CM) - - -  Medication Review (RN Transport planner) Referral to Pharmacy Complete -  PCP or Specialist appointment within 3-5 days of discharge Not Complete Not Complete -  PCP/Specialist Appt Not Complete comments LTC SNF resident pt is SNF resident -  [place] or [place] Not Complete Not Complete Complete  HRI or Home Care Consult Pt Refusal Comments LTC SNF resident pt is SNF resident -  SW Recovery Care/Counseling Consult Complete Complete Complete  Palliative Care Screening Not Applicable Not Applicable -  Skilled Nursing Facility Complete Complete -  Some recent data might be hidden

## 2019-03-22 NOTE — Progress Notes (Signed)
Pt had right IJ.

## 2019-03-22 NOTE — Progress Notes (Signed)
Referring Physician(s): Eddie Davies,Davies/Eddie Davies,E  Supervising Physician: Eddie Davies  Patient Status:  [facility] - In-pt  Chief Complaint: Left flank pain   Subjective: Pt cont to have some left flank soreness; denies N/V; not very talkative/interactive  Allergies: Fish oil  Medications: Prior to Admission medications   Medication Sig Start Date End Date Taking? Authorizing Provider  acetaminophen (TYLENOL) 325 MG tablet Take 650 mg by mouth every 8 (eight) hours as needed for fever (pain).   Yes [provider]  allopurinol (ZYLOPRIM) 100 MG tablet Take 100 mg by mouth daily.  08/01/18  Yes [provider]  calcium acetate (PHOSLO) 667 MG capsule Take 2,001 mg by mouth 3 (three) times daily with meals.    Yes [provider]  carbamazepine (CARBATROL) 100 MG 12 hr capsule Take 100 mg by mouth 2 (two) times daily. 02/23/19  Yes [provider]  carbamazepine (TEGRETOL XR) 400 MG 12 hr tablet Take 800 mg by mouth 2 (two) times daily.   Yes [provider]  cloNIDine (CATAPRES) 0.1 MG tablet Take 0.1 mg by mouth 2 (two) times daily as needed (SBP ABOVE 170).  07/25/18  Yes [provider]  DULoxetine (CYMBALTA) 20 MG capsule Take 20 mg by mouth daily.   Yes [provider]  ferrous sulfate 325 (65 FE) MG tablet Take 325 mg by mouth daily with breakfast.   Yes [provider]  guaiFENesin (MUCINEX) 600 MG 12 hr tablet Take 600 mg by mouth every 12 (twelve) hours as needed for cough (congestion).   Yes [provider]  insulin aspart (NOVOLOG) 100 UNIT/ML injection Inject 0-15 Units into the skin 4 (four) times daily -  with meals and at bedtime. 70-120=0 units, 121-150=2 units, 151-200=3 units, 201-250=5 units, 251-300=8 units, 301-350=11 units, 351-400=15 units, >400 call MD   Yes [provider]  Insulin Glargine (BASAGLAR KWIKPEN) 100 UNIT/ML SOPN Inject 0.28 mLs (28 Units total) into the skin at  bedtime. Patient taking differently: Inject 20 Units into the skin at bedtime.  12/05/18 03/13/19 Yes Eddie [place] Eddie Davies, Eddie Davies  Ipratropium-Albuterol (COMBIVENT) 20-100 MCG/ACT AERS respimat Inhale 1 puff into the lungs every 6 (six) hours as needed for wheezing.   Yes [provider]  lactulose (CHRONULAC) 10 GM/15ML solution Take 15 mLs (10 g total) by mouth 2 (two) times daily. Patient taking differently: Take 30 g by mouth 2 (two) times daily.  05/31/18  Yes Eddie Davies, Eddie K, MD  lidocaine-prilocaine (EMLA) cream Apply 1 application topically every Monday, Wednesday, and Friday.   Yes [provider]  Multiple Vitamin (MULTIVITAMIN WITH MINERALS) TABS tablet Take 1 tablet by mouth at bedtime.   Yes [provider]  omeprazole (PRILOSEC) 20 MG capsule Take 20 mg by mouth daily.  07/04/18  Yes [provider]  ondansetron (ZOFRAN) 4 MG tablet Take 4 mg by mouth every 8 (eight) hours as needed for nausea or vomiting.  08/03/18  Yes [provider]  PHENobarbital (LUMINAL) 64.8 MG tablet Take 4 tablets (259.2 mg total) by mouth at bedtime. 12/05/18 03/13/19 Yes Eddie [place] Eddie Davies, Eddie Davies  PROTEIN PO Take 30 mLs by mouth in the morning and at bedtime.   Yes [provider]  simvastatin (ZOCOR) 20 MG tablet Take 20 mg by mouth at bedtime.    Yes [provider]  sodium bicarbonate 650 MG tablet Take 650 mg by mouth 3 (three) times daily.   Yes [provider]  tamsulosin (FLOMAX) 0.4 MG CAPS  capsule Take 0.4 mg by mouth at bedtime.   Yes [provider]  Darbepoetin Alfa (ARANESP) 200 MCG/0.4ML SOSY injection Inject 0.4 mLs (200 mcg total) into the vein every Thursday with hemodialysis. 06/28/18   Eddie Davies, Eddie Flattery, MD  HYDROcodone-acetaminophen (NORCO) 5-325 MG tablet Take 1 tablet by mouth every 6 (six) hours as needed for moderate pain. 12/05/18   Eddie [place] Eddie Poag, Eddie Davies     Vital Signs: BP 110/64 (BP Location: Left Arm)   Pulse 87    Temp 98.2 F (36.8 C) (Oral)   Resp 17   Ht 6' 2"  (1.88 m)   Wt 194 lb 7.1 oz (88.2 kg)   SpO2 100%   BMI 24.97 kg/m   Physical Exam awake/alert; left flank drains intact; OP from drain 1 none, drain 2  40 cc bloody fluid; drain 1 JP not holding charge(just fills with air indicating either displacement or in air filled cavity now) Imaging: No results found.  Labs:  CBC: Recent Labs    03/19/19 0122 03/20/19 0152 03/21/19 0146 03/22/19 0257  WBC 4.5 4.1 5.9 4.3  HGB 8.1* 8.4* 8.4* 8.1*  HCT 25.3* 26.4* 25.9* 25.2*  PLT 154 149* 146* 135*    COAGS: Recent Labs    05/19/18 1339 05/21/18 0419 05/29/18 0500 06/18/18 1319 03/12/19 2331 03/18/19 0559  INR 1.6*   < > 1.3* 1.9* 1.7* 1.2  APTT 47*  --   --   --  39*  --    < > = values in this interval not displayed.    BMP: Recent Labs    03/19/19 0122 03/20/19 0152 03/21/19 0146 03/22/19 0257  NA 138 135 134* 136  Davies 3.5 3.7 3.4* 3.7  CL 105 104 99 100  CO2 25 21* 26 22  GLUCOSE 154* 283* 140* 159*  BUN 25* 41* 28* 47*  CALCIUM 7.5* 7.6* 7.7* 7.9*  CREATININE 2.80* 3.89* 2.74* 3.97*  GFRNONAA 26* 17* 26* 17*  GFRAA 30* 20* 30* 19*    LIVER FUNCTION TESTS: Recent Labs    03/12/19 2051 03/12/19 2051 03/13/19 0423 03/13/19 1230 03/16/19 0135 03/17/19 0259 03/21/19 0146 03/22/19 0257  BILITOT 0.8  --  0.4  --   --   --  0.4 0.6  AST 10*  --  10*  --   --   --  16 24  ALT 8  --  7  --   --   --  9 8  ALKPHOS 124  --  108  --   --   --  122 111  PROT 6.6  --  5.6*  --   --   --  6.2* 5.9*  ALBUMIN 1.8*   < > 1.5*   < > 1.6* 1.5* 1.8* 1.8*   < > = values in this interval not displayed.    Assessment and Plan: Pt s/p left RP fluid coll/abscess drains x2 2/18; s/p rt IJ HD cath removal 2/19 secondary to bacteremia; afebrile; drain fluid cx with klebsiella; WBC nl; hgb 8.1(8.4),plts 135k, creat 3.97; rt IJ HD cath ok; for f/u CT today to reassess RP drains; also for tunneled IJ CVC for OP antbx- pt  aware and consent signed.   Electronically Signed: D. Rowe Robert, [name] 03/22/2019, 2:38 PM   I spent a total of 15 minutes at the the patient's bedside AND on the patient's hospital floor or unit, greater than 50% of which was counseling/coordinating care for left retroperitoneal abscess drains  Patient ID: Eddie Davies, male   DOB: 01-30-1970, 49 y.o.   MRN: [id number]

## 2019-03-23 ENCOUNTER — Inpatient Hospital Stay (HOSPITAL_COMMUNITY): Payer: Medicare Other

## 2019-03-23 LAB — COMPREHENSIVE METABOLIC PANEL
ALT: 8 U/L (ref 0–44)
ALT: 10 U/L (ref 0–44)
AST: 24 U/L (ref 15–41)
AST: 25 U/L (ref 15–41)
Albumin: 1.8 g/dL — ABNORMAL LOW (ref 3.5–5.0)
Albumin: 1.7 g/dL — ABNORMAL LOW (ref 3.5–5.0)
Alkaline Phosphatase: 111 U/L (ref 38–126)
Alkaline Phosphatase: 109 U/L (ref 38–126)
Anion gap: 14 (ref 5–15)
Anion gap: 6 (ref 5–15)
BUN: 47 mg/dL — ABNORMAL HIGH (ref 6–20)
BUN: 35 mg/dL — ABNORMAL HIGH (ref 6–20)
CO2: 22 mmol/L (ref 22–32)
CO2: 25 mmol/L (ref 22–32)
Calcium: 7.9 mg/dL — ABNORMAL LOW (ref 8.9–10.3)
Calcium: 7.5 mg/dL — ABNORMAL LOW (ref 8.9–10.3)
Chloride: 100 mmol/L (ref 98–111)
Chloride: 102 mmol/L (ref 98–111)
Creatinine, Ser: 3.97 mg/dL — ABNORMAL HIGH (ref 0.61–1.24)
Creatinine, Ser: 3.1 mg/dL — ABNORMAL HIGH (ref 0.61–1.24)
GFR calc Af Amer: 19 mL/min — ABNORMAL LOW (ref >60)
GFR calc Af Amer: 26 mL/min — ABNORMAL LOW (ref >60)
GFR calc non Af Amer: 17 mL/min — ABNORMAL LOW (ref >60)
GFR calc non Af Amer: 23 mL/min — ABNORMAL LOW (ref >60)
Glucose, Bld: 159 mg/dL — ABNORMAL HIGH (ref 70–99)
Glucose, Bld: 227 mg/dL — ABNORMAL HIGH (ref 70–99)
Potassium: 3.7 mmol/L (ref 3.5–5.1)
Potassium: 3.4 mmol/L — ABNORMAL LOW (ref 3.5–5.1)
Sodium: 136 mmol/L (ref 135–145)
Sodium: 133 mmol/L — ABNORMAL LOW (ref 135–145)
Total Bilirubin: 0.6 mg/dL (ref 0.3–1.2)
Total Bilirubin: 0.6 mg/dL (ref 0.3–1.2)
Total Protein: 5.9 g/dL — ABNORMAL LOW (ref 6.5–8.1)
Total Protein: 5.5 g/dL — ABNORMAL LOW (ref 6.5–8.1)

## 2019-03-23 LAB — GLUCOSE, CAPILLARY
Glucose-Capillary: 211 mg/dL — ABNORMAL HIGH (ref 70–99)
Glucose-Capillary: 81 mg/dL (ref 70–99)
Glucose-Capillary: 132 mg/dL — ABNORMAL HIGH (ref 70–99)

## 2019-03-23 LAB — CBC WITH DIFFERENTIAL/PLATELET
Abs Immature Granulocytes: 0.03 10*3/uL (ref 0.00–0.07)
Basophils Absolute: 0 10*3/uL (ref 0.0–0.1)
Basophils Relative: 1 %
Eosinophils Absolute: 0 10*3/uL (ref 0.0–0.5)
Eosinophils Relative: 1 %
HCT: 24.2 % — ABNORMAL LOW (ref 39.0–52.0)
Hemoglobin: 7.8 g/dL — ABNORMAL LOW (ref 13.0–17.0)
Immature Granulocytes: 1 %
Lymphocytes Relative: 29 %
Lymphs Abs: 1.2 10*3/uL (ref 0.7–4.0)
MCH: 30.4 pg (ref 26.0–34.0)
MCHC: 32.2 g/dL (ref 30.0–36.0)
MCV: 94.2 fL (ref 80.0–100.0)
Monocytes Absolute: 0.3 10*3/uL (ref 0.1–1.0)
Monocytes Relative: 7 %
Neutro Abs: 2.5 10*3/uL (ref 1.7–7.7)
Neutrophils Relative %: 61 %
Platelets: 144 10*3/uL — ABNORMAL LOW (ref 150–400)
RBC: 2.57 MIL/uL — ABNORMAL LOW (ref 4.22–5.81)
RDW: 16.4 % — ABNORMAL HIGH (ref 11.5–15.5)
WBC: 4 10*3/uL (ref 4.0–10.5)
nRBC: 0 % (ref 0.0–0.2)

## 2019-03-23 LAB — PHOSPHORUS: Phosphorus: 4.7 mg/dL — ABNORMAL HIGH (ref 2.5–4.6)

## 2019-03-23 LAB — MAGNESIUM: Magnesium: 2.1 mg/dL (ref 1.7–2.4)

## 2019-03-23 MED ORDER — CEFEPIME IV (FOR PTA / DISCHARGE USE ONLY): 2.0000 g | INTRAVENOUS | 0 refills | Status: AC | PRN

## 2019-03-23 MED ORDER — SENNOSIDES-DOCUSATE SODIUM 8.6-50 MG PO TABS: 1.0000 | ORAL_TABLET | Freq: Every day | ORAL | Status: DC

## 2019-03-23 MED ORDER — IOHEXOL 300 MG/ML  SOLN
100.0000 mL | Freq: Once | INTRAMUSCULAR | Status: AC | PRN
  Administered 2019-03-23: 100 mL via INTRAVENOUS

## 2019-03-23 MED ORDER — MEROPENEM IV (FOR PTA / DISCHARGE USE ONLY): 500.0000 mg | INTRAVENOUS | 0 refills | Status: AC

## 2019-03-23 MED ORDER — POLYETHYLENE GLYCOL 3350 17 G PO PACK
17.0000 g | PACK | Freq: Two times a day (BID) | ORAL | Status: DC
  Administered 2019-03-23: 17 g via ORAL
  Filled 2019-03-23: qty 1

## 2019-03-23 MED ORDER — BISACODYL 10 MG RE SUPP: 10.0000 mg | Freq: Every day | RECTAL | Status: DC | PRN

## 2019-03-23 MED ORDER — SENNOSIDES-DOCUSATE SODIUM 8.6-50 MG PO TABS
1.0000 | ORAL_TABLET | Freq: Two times a day (BID) | ORAL | Status: DC
  Administered 2019-03-23: 1 via ORAL
  Filled 2019-03-23: qty 1

## 2019-03-23 MED ORDER — POTASSIUM CHLORIDE CRYS ER 20 MEQ PO TBCR
40.0000 meq | EXTENDED_RELEASE_TABLET | Freq: Once | ORAL | Status: AC
  Administered 2019-03-23: 40 meq via ORAL
  Filled 2019-03-23: qty 2

## 2019-03-23 MED ORDER — POLYETHYLENE GLYCOL 3350 17 G PO PACK: 17.0000 g | PACK | Freq: Every day | ORAL | 0 refills | Status: DC | PRN

## 2019-03-23 NOTE — Progress Notes (Addendum)
Patient discharged to [facility] via [place] transporter, reported to nurse [place]. Patient's right hemodialysis catheter and right single lumen internal jugular was clamped, dry and intact.

## 2019-03-23 NOTE — Progress Notes (Signed)
[facility] Progress Note   Subjective:  Seen in room. Denies CP or dyspnea. Less pain in L flank - repeat CT done 2/26 showed improved abscess. Has central line for IV antibiotics - possibly will be discharged today.  Objective Vitals:   03/22/19 1225 03/22/19 1330 03/22/19 2100 03/23/19 0511  BP: 109/62 110/64 139/80 116/74  Pulse: 82 87 82 71  Resp: 14 17 17 17   Temp: 97.9 F (36.6 C) 98.2 F (36.8 C) 98.4 F (36.9 C) 98.1 F (36.7 C)  TempSrc: Oral Oral Oral Oral  SpO2: 95% 100% 100% 99%  Weight:      Height:       Physical Exam General: Chronically ill appearing man, NAD Heart: RRR; no murmur Lungs: CTAB Abdomen: soft, non-tender. 2 JP drains to L flank Extremities: No LE edema Dialysis Access: TDC in R chest, R sided central line, maturing L AVF  Additional Objective Labs: Basic Metabolic Panel: Recent Labs  Lab 03/21/19 0146 03/22/19 0257 03/23/19 0503  NA 134* 136 133*  K 3.4* 3.7 3.4*  CL 99 100 102  CO2 26 22 25   GLUCOSE 140* 159* 227*  BUN 28* 47* 35*  CREATININE 2.74* 3.97* 3.10*  CALCIUM 7.7* 7.9* 7.5*  PHOS 3.9 6.6* 4.7*   Liver Function Tests: Recent Labs  Lab 03/21/19 0146 03/22/19 0257 03/23/19 0503  AST 16 24 25   ALT 9 8 10   ALKPHOS 122 111 109  BILITOT 0.4 0.6 0.6  PROT 6.2* 5.9* 5.5*  ALBUMIN 1.8* 1.8* 1.7*   CBC: Recent Labs  Lab 03/19/19 0122 03/19/19 0122 03/20/19 0152 03/20/19 0152 03/21/19 0146 03/22/19 0257 03/23/19 0503  WBC 4.5   < > 4.1   < > 5.9 4.3 4.0  NEUTROABS  --   --   --   --  3.9 2.7 2.5  HGB 8.1*   < > 8.4*   < > 8.4* 8.1* 7.8*  HCT 25.3*   < > 26.4*   < > 25.9* 25.2* 24.2*  MCV 93.7  --  94.3  --  92.8 94.0 94.2  PLT 154   < > 149*   < > 146* 135* 144*   < > = values in this interval not displayed.   Studies/Results: CT ABDOMEN PELVIS W CONTRAST  Result Date: 03/23/2019 CLINICAL DATA:  History of left nephrectomy and left retroperitoneal abscess, interval drain placement EXAM: CT  ABDOMEN AND PELVIS WITH CONTRAST TECHNIQUE: Multidetector CT imaging of the abdomen and pelvis was performed using the standard protocol following bolus administration of intravenous contrast. CONTRAST:  123m OMNIPAQUE IOHEXOL 300 MG/ML  SOLN COMPARISON:  03/12/2019 FINDINGS: Lower chest: Persistent small left pleural effusion and left basilar atelectasis. Hepatobiliary: No focal liver abnormality is seen. Status post cholecystectomy. No biliary dilatation. Pancreas: Diffuse pancreatic atrophy again noted, with multiple coarse parenchymal calcifications consistent with sequela of chronic calcific pancreatitis. Stable 2.3 cm cyst within the pancreatic body consistent with pseudocyst. Spleen: Stable enlargement of the spleen. No focal parenchymal abnormalities. Adrenals/Urinary Tract: Stable nonobstructing 13 mm calculus within the right renal pelvis. Small right renal cortical cyst is noted unchanged. Otherwise the right kidney is unremarkable. The left kidney is surgically absent. The left-sided retroperitoneal abscess seen on prior study has been drained in the interim, with pigtail drainage catheter in place. There is persistent gas and fluid superior and medial to the drainage catheter, measuring 3.8 x 3.3 cm reference image 39. Overall, significant improvement since prior study. Bladder is decompressed which limits  its evaluation. No adrenal masses. Stomach/Bowel: No bowel obstruction or ileus. Persistent large amount of stool within the rectal vault. Vascular/Lymphatic: Multiple retroperitoneal lymph nodes are again noted, likely reactive. No vascular abnormalities. Reproductive: Prostate is unremarkable. Other: Diffuse anasarca again noted. No free intraperitoneal fluid or free gas. Musculoskeletal: No acute or destructive bony lesions. Postsurgical changes left hip. Reconstructed images demonstrate no additional findings. IMPRESSION: 1. Significant improvement in the left-sided retroperitoneal abscess after  pigtail drainage catheter placement. A small amount of gas and fluid persist along the superomedial aspect of the prior fluid collection. 2. Stable nonobstructing right renal calculus. 3. Large amount of stool within the distal sigmoid colon and rectum. 4. Stable trace left pleural effusion. 5. Anasarca. 6. Stable splenomegaly. 7. Stable sequela of chronic calcific pancreatitis. Electronically Signed   By: Randa Ngo M.D.   On: 03/23/2019 01:45   IR Fluoro Guide CV Line Right  Result Date: 03/23/2019 INDICATION: 49 year old male with a history of infection referred for tunneled central venous catheter. EXAM: TUNNELED PICC LINE WITH ULTRASOUND AND FLUOROSCOPIC GUIDANCE MEDICATIONS: None ANESTHESIA/SEDATION: None FLUOROSCOPY TIME:  Fluoroscopy Time: 0 minutes 30 seconds (5 mGy). COMPLICATIONS: None PROCEDURE: After written informed consent was obtained, patient was placed in the supine position on angiographic table. Patency of the right internal jugular vein was confirmed with ultrasound with image documentation. Patient was prepped and draped in the usual sterile fashion including the right neck and right superior chest. Using ultrasound guidance, the skin and subcutaneous tissues overlying the right internal jugular vein were generously infiltrated with 1% lidocaine without epinephrine. Using ultrasound guidance, the right internal jugular vein was punctured with a micropuncture needle, and an 018 wire was advanced into the right heart confirming venous access. A small stab incision was made with an 11 blade scalpel. Peel-away sheath was placed over the wire, and then the wire was removed, marking the wire for estimation of internal catheter length. The chest wall was then generously infiltrated with 1% lidocaine for local anesthesia along the tissue tract. Small stab incision was made with 11 blade scalpel, and then the catheter was back tunneled to the puncture site at the right internal jugular vein.  Catheter was pulled through the tract, with the catheter amputated at 20 cm. Catheter was advanced through the peel-away sheath, and the peel-away sheath was removed. Final image was stored. The catheter was anchored to the chest wall with 2 retention sutures, and Derma bond was used to seal the right internal jugular vein incision site and at the right chest wall. Patient tolerated the procedure well and remained hemodynamically stable throughout. No complications were encountered and no significant blood loss was encountered. FINDINGS: After catheter placement, the tip lies within the superior cavoatrial junction. The catheter aspirates and flushes normally and is ready for immediate use. IMPRESSION: Status post right IJ tunneled cuffed central venous catheter. Signed, Dulcy Fanny. Dellia Nims, RPVI [facility] Radiology Electronically Signed   By: Corrie Mckusick D.O.   On: 03/23/2019 08:34   IR US Guide Vasc Access Right  Result Date: 03/23/2019 INDICATION: 49 year old male with a history of infection referred for tunneled central venous catheter. EXAM: TUNNELED PICC LINE WITH ULTRASOUND AND FLUOROSCOPIC GUIDANCE MEDICATIONS: None ANESTHESIA/SEDATION: None FLUOROSCOPY TIME:  Fluoroscopy Time: 0 minutes 30 seconds (5 mGy). COMPLICATIONS: None PROCEDURE: After written informed consent was obtained, patient was placed in the supine position on angiographic table. Patency of the right internal jugular vein was confirmed with ultrasound with  image documentation. Patient was prepped and draped in the usual sterile fashion including the right neck and right superior chest. Using ultrasound guidance, the skin and subcutaneous tissues overlying the right internal jugular vein were generously infiltrated with 1% lidocaine without epinephrine. Using ultrasound guidance, the right internal jugular vein was punctured with a micropuncture needle, and an 018 wire was advanced into  the right heart confirming venous access. A small stab incision was made with an 11 blade scalpel. Peel-away sheath was placed over the wire, and then the wire was removed, marking the wire for estimation of internal catheter length. The chest wall was then generously infiltrated with 1% lidocaine for local anesthesia along the tissue tract. Small stab incision was made with 11 blade scalpel, and then the catheter was back tunneled to the puncture site at the right internal jugular vein. Catheter was pulled through the tract, with the catheter amputated at 20 cm. Catheter was advanced through the peel-away sheath, and the peel-away sheath was removed. Final image was stored. The catheter was anchored to the chest wall with 2 retention sutures, and Derma bond was used to seal the right internal jugular vein incision site and at the right chest wall. Patient tolerated the procedure well and remained hemodynamically stable throughout. No complications were encountered and no significant blood loss was encountered. FINDINGS: After catheter placement, the tip lies within the superior cavoatrial junction. The catheter aspirates and flushes normally and is ready for immediate use. IMPRESSION: Status post right IJ tunneled cuffed central venous catheter. Signed, Dulcy Fanny. Dellia Nims, RPVI [facility] Radiology Electronically Signed   By: Corrie Mckusick D.O.   On: 03/23/2019 08:34   Medications: . meropenem (MERREM) IV 500 mg (03/22/19 2252)   . allopurinol  100 mg Oral Daily  . carbamazepine  900 mg Oral BID  . Chlorhexidine Gluconate Cloth  6 each Topical Q0600  . darbepoetin (ARANESP) injection - DIALYSIS  150 mcg Intravenous Q Fri-HD  . feeding supplement (NEPRO CARB STEADY)  237 mL Oral TID BM  . ferrous sulfate  325 mg Oral Q breakfast  . FLUoxetine  20 mg Oral Daily  . insulin aspart  0-5 Units Subcutaneous QHS  . insulin aspart  0-9 Units Subcutaneous TID WC   . insulin aspart  2 Units Subcutaneous TID WC  . insulin detemir  10 Units Subcutaneous Daily  . lactulose  20 g Oral BID  . multivitamin with minerals  1 tablet Oral QHS  . pantoprazole  40 mg Oral Daily  . PHENobarbital  243 mg Oral QHS  . polyethylene glycol  17 g Oral BID  . senna-docusate  1 tablet Oral BID  . simvastatin  20 mg Oral QHS  . sodium chloride flush  5 mL Intracatheter Q8H  . tamsulosin  0.4 mg Oral QHS    Dialysis Orders: MWF at [facility] 4:15hr, 400/800, EDW 94kg, 3K/2.25Ca, TDC, heparin 3000 bolus - Mircera 124mg IV q 2 weeks (2/10)  Assessment/Plan: 1. 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 Urology consulted - not felt to be candidate for open wash-out,s/pIR drainsand Meropenem. IDsaw and recommend removalTDC for line holiday - done. New TDC placement2/22. Per ID - 2 more weeks of Mero(thru 3/4)then Cefepime q HD for another 2 weeks. 2. ESRD:Will continue HD on MWF schedule-next 3/1. 3. Hypokalemia: Recurrent issue - continuing 4K bath. 4. Hypertension/volume:BPstable, will lower EDW on d/c. 5. Anemia:Hgb7.8 - Aranesp given 2/26. 6. Metabolic bone disease:CorrCa/Phos  ok- binders held as of 2/20 - follow. 7. T2DM:Per primary. 8. R kidney stone: Per CT - non-obstructing,asymptomatic 9. Hx seizure disorder 10. Liver disease:Lactulose BID. 11. Nutrition: Alb remains low - continue Nepro + high protein diet.  Veneta Penton, [name] 03/23/2019, 11:27 AM  [employer]

## 2019-03-23 NOTE — Progress Notes (Signed)
PHARMACY CONSULT NOTE FOR:  OUTPATIENT  PARENTERAL ANTIBIOTIC THERAPY (OPAT)  Indication: klebsiella pneumoniae retroperitoneal abscess  Regimen: meropenem 533m IV q24h until 03/28/2019 then start cefepime 2g IV with each HD on 03/29/2019 until 04/12/2019  IV antibiotic discharge orders are pended. To discharging provider:  please sign these orders via discharge navigator,  Select New Orders & click on the button choice - Manage This Unsigned Work.    Thank you for allowing pharmacy to be a part of this patient's care.  ABrendolyn Patty PharmD PGY2 Pharmacy Resident Phone [phone] 03/23/2019   12:50 PM

## 2019-03-23 NOTE — Discharge Summary (Signed)
Physician Discharge Summary  Eddie Davies NOI:[id number] DOB: January 18, 1971 DOA: 03/12/2019  PCP: [facility], [place] date: 03/12/2019 Discharge date: 03/23/2019  Admitted From: SNF Disposition: SNF  Recommendations for Outpatient Follow-up:  1. Follow up with PCP in 1-2 weeks 2. Follow up with Interventional Radiology Dr. Anselm Pancoast in 1-2 weeks 3. Follow up with Urology in the outpatient setting 4. Follow up with Nephrology and Continue Maintenance of Dialysis 5. Follow up with Infectious Diseases and Continue Abx as indicated by them  6. Repeat CT scan of the Abd/Pelvis when drain output<10 to 15 mL/day for 2-3 days or when indicated by Interventional Radiology at Follow up 7. Please obtain CMP/CBC, Mag, Phos in one week 8. Please follow up on the following pending results:  Home Health: No Equipment/Devices: Has a Hoyer to Transfer him to [facility] for Dialysis   Discharge Condition: Guarded CODE STATUS: FULL CODE Diet recommendation: Renal Diet 1200 mL Fluid Restriction   Brief/Interim Summary: Eddie Nash Phillipsis a 49 y.o.malewith a history of ESRD on HD, ESBLwho was admitted early this morning by Dr. Nilda Simmer retroperitoneal abscess from SNF.He was recently admitted for left-sided pyelonephritis and sepsis status post left-sided radical nephrectomy and right double-J stent placement at OSH who presented with N/V/fever. Has since been evaluated by nephrology and patient underwent HD and has been dialyzing.   From H&P: Patient has been battling K.Pneumo for past several months, recent admits include:  11/3-11/11/2018 at [facility] for sepsis related to L sided pyelo  12/26/2018-01/29/2019 Admission to [name] for sepsis, concern for dialysis cath infection, klebsiella bacteremia, left-sided emphysematous pyelonephritis with right renal pelvis stone, left-sided radical nephrectomy and right stent performed on 01/09/2019, subsequently in ICU on  vasopressors, + Covid test on 01/03/2019, uncontrolled blood sugars without DKA  01/12-01/20/21 Admission to [facility] for retroperitoneal fluid collection, IV abx with IR drain placement 02/06/19, with improvement on repeat imaging  03/07/2019 R ureteral stent removal.  2/18: Status post CT-guided drain placement x2. ID consulted. 2/19: Tunneled HD catheter removed via IR 2/22: R IJ TDC placed by IR  OPAT IV Abx orders have been pended and IR recommended 3 times daily flushes with 5 cc of normal saline and recording drain output every shift dressing changes every day or when soiled and they recommend not tender to suction bulbs. ID  Evaluated and recommending continuing empiric meropenem and then changing to IV Cefepime.  Patient underwent a right IJ PICC on 03/22/2019 after his dialysis session for his maintenance of antibiotics for outpatient setting.  Repeat CT Done yesterday and is improving.  He is stable for discharge and will need to follow-up with nephrology, IR, ID, and Urology in the outpatient setting.   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  Left retroperitoneal Klebsiella pneumonia abscess with history of ESBL Klebsiella bacteremia  -2/18: status post CT-guided drain placement x2 via IR -2/19: Tunneled HD catheter removed via IRfor 48 hr line holiday -2/22: R IJ TDC placed -Has not had a nephrectomy and urology felt he is not a candidate to have an open washout -Per ID: 2 weeks of Carbapenem therapy from date of HD line removal (2/19->3/4) then switch to  cefepime with HD and complete an additional 2 weeksand be sure his abscesses have resolved and drains  removed.Will need a separate line for this prior to discharge and will order a Midline if ok with Nephrology  -Interventional radiology evaluated and output continues to elevated -Continue TID flushes with 5 cc NS, record drain output Qshift, dressing changes QD or PRN when soiled. Please do not invert the suction bulbs as this does not provide appropriate suction; pinch bulb between thumb and forefinger before closing valve to charge bulb. -The drains will need to be flushed QD with 5 cc NS, record output from each at least once daily, dressing changes every 2-3 days or as needed if soiled, do not submerge drains. He will follow up with IR clinic 10-14 days post  -Repeat a CT of the abdomen pelvis to evaluate his his abscess yesterday showed "Significant improvement in the left-sided retroperitoneal abscess after pigtail drainage catheter placement. A small amount of gas and fluid persist along the superomedial aspect of the prior fluid collection. Stable nonobstructing right renal calculus. Large amount of stool within the distal sigmoid colon and rectum. Stable trace left pleural effusion. Anasarca. Stable splenomegaly. Stable sequela of chronic calcific pancreatitis" -Repeat SARS-CoV-2 Test negative  -Underwent a right IJ central venous catheter with a single-lumen for his IV meropenem and IV Cefepime for his discharge planning -Repeat CT scan when drain output<10 to 15 mL/day for 2-3 days or when indicated by Interventional Radiology at Follow up  Hypokalemia  -Potassium this AM was 3.4 -Replete with po KCl 40 mEQ x1  -Continue monitor and replete as necessary -Repeat CMP within 1 week   ESLD on lactulose -Checked LFTs and are within normal limits as his AST is 25 and his ALT is 10  ESRD on HD M, W, F -HD the day before yesterday and he is dialyzing today -Patient's BUN/Cr now today is 35/3.10 after dialysis  -Continue to Monitor and Trend Renal Fxn -Repeat CMP In AM   Seizure  disorder -continue Tegretol and phenobarbital  Diabetes -C/wLevemirto 10units daily, continue sliding scale and adjust; Now change q4h SSI to AC/HS and added 2 units Novolog TIDwm; Resume Home Insulin regimen  -CBG's ranging from 974-163  Metabolic bone disease  -C/w PhosLo  Normocytic Anemia/Anemia of CKD -Was given ESA as outpatient -Patient's Hgb/Hct was stable at 8.4/25.9 the day before yesterday and slightly dropped yesterday to 8.1/5.2 and today is 7.8/24.2 -Continue to Monitor for S/Sx of Bleeding; Currently no Overt Bleeding noted -Repeat CBC in AM   Hypertension  -continue current regimen and volume removal per nephro  Chronic nonobstructing right urolithiasis -stable by last CT but will repeat today given his continued fatigue and back pain -Follow up outpatient with Urology   Hyperlipidemia  -On statin  Thrombocytopenia -Mild as Platelet Count is now 144 -Continue to Monitor for S/Sx of Bleeding; Currently no Overt Bleeding noted -Repeat CBC in AM   Discharge Instructions  Discharge Instructions    Discharge instructions   Complete by: As directed    Drain to likely remain in place at discharge. Continue to flush twice daily with 5 mL sterile saline.  Change dressing as needed in order to keep drain site clean and dry.  Do not submerge drain.  Schedulers will contact you with date and time of appointment in Interventional Radiology drain clinic.   Home infusion instructions   Complete by: As directed    Instructions: Flushing of vascular access device: 0.9% NaCl pre/post medication  administration and prn patency; Heparin 100 u/ml, 70m for implanted ports and Heparin 10u/ml, 529mfor all other central venous catheters.   Home infusion instructions   Complete by: As directed    Instructions: Flushing of vascular access device: 0.9% NaCl pre/post medication administration and prn patency; Heparin 100 u/ml, 32m732mor implanted ports and Heparin 10u/ml,  32ml37mr all other central venous catheters.     Allergies as of 03/23/2019      Reactions   Fish Oil Swelling, Other (See Comments)         Medication List    TAKE these medications   acetaminophen 325 MG tablet Commonly known as: TYLENOL Take 650 mg by mouth every 8 (eight) hours as needed for fever (pain).   allopurinol 100 MG tablet Commonly known as: ZYLOPRIM Take 100 mg by mouth daily.   Basaglar KwikPen 100 UNIT/ML Sopn Inject 0.28 mLs (28 Units total) into the skin at bedtime. What changed: how much to take   calcium acetate 667 MG capsule Commonly known as: PHOSLO Take 2,001 mg by mouth 3 (three) times daily with meals.   carbamazepine 400 MG 12 hr tablet Commonly known as: TEGRETOL XR Take 800 mg by mouth 2 (two) times daily.   carbamazepine 100 MG 12 hr capsule Commonly known as: CARBATROL Take 100 mg by mouth 2 (two) times daily.   ceFEPime  IVPB Commonly known as: MAXIPIME Inject 2 g into the vein every dialysis for up to 14 days (with HD every MWF). Indication: retropeitoneal abscess Last Day of Therapy:  04/12/2019 Labs - Once weekly:  CBC/D and BMP, Labs - Every other week:  ESR and CRP Start taking on: March 29, 2019   cloNIDine 0.1 MG tablet Commonly known as: CATAPRES Take 0.1 mg by mouth 2 (two) times daily as needed (SBP ABOVE 170).   Darbepoetin Alfa 200 MCG/0.4ML Sosy injection Commonly known as: ARANESP Inject 0.4 mLs (200 mcg total) into the vein every Thursday with hemodialysis.   DULoxetine 20 MG capsule Commonly known as: CYMBALTA Take 20 mg by mouth daily.   ferrous sulfate 325 (65 FE) MG tablet Take 325 mg by mouth daily with breakfast.   guaiFENesin 600 MG 12 hr tablet Commonly known as: MUCINEX Take 600 mg by mouth every 12 (twelve) hours as needed for cough (congestion).   HYDROcodone-acetaminophen 5-325 MG tablet Commonly known as: Norco Take 1 tablet by mouth every 6 (six) hours as needed for moderate pain.   insulin  aspart 100 UNIT/ML injection Commonly known as: novoLOG Inject 0-15 Units into the skin 4 (four) times daily -  with meals and at bedtime. 70-120=0 units, 121-150=2 units, 151-200=3 units, 201-250=5 units, 251-300=8 units, 301-350=11 units, 351-400=15 units, >400 call MD   Ipratropium-Albuterol 20-100 MCG/ACT Aers respimat Commonly known as: COMBIVENT Inhale 1 puff into the lungs every 6 (six) hours as needed for wheezing.   lactulose 10 GM/15ML solution Commonly known as: CHRONULAC Take 15 mLs (10 g total) by mouth 2 (two) times daily. What changed: how much to take   lidocaine-prilocaine cream Commonly known as: EMLA Apply 1 application topically every Monday, Wednesday, and Friday.   meropenem  IVPB Commonly known as: MERREM Inject 500 mg into the vein daily for 5 days. Indication: retropeitoneal abscess Last Day of Therapy:  03/28/2019 Labs - Once weekly:  CBC/D and BMP, Labs - Every other week:  ESR and CRP   multivitamin with minerals Tabs tablet Take 1 tablet by mouth at bedtime.  omeprazole 20 MG capsule Commonly known as: PRILOSEC Take 20 mg by mouth daily.   ondansetron 4 MG tablet Commonly known as: ZOFRAN Take 4 mg by mouth every 8 (eight) hours as needed for nausea or vomiting.   PHENobarbital 64.8 MG tablet Commonly known as: LUMINAL Take 4 tablets (259.2 mg total) by mouth at bedtime.   polyethylene glycol 17 g packet Commonly known as: MIRALAX / GLYCOLAX Take 17 g by mouth daily as needed.   PROTEIN PO Take 30 mLs by mouth in the morning and at bedtime.   senna-docusate 8.6-50 MG tablet Commonly known as: Senokot-S Take 1 tablet by mouth at bedtime.   simvastatin 20 MG tablet Commonly known as: ZOCOR Take 20 mg by mouth at bedtime.   sodium bicarbonate 650 MG tablet Take 650 mg by mouth 3 (three) times daily.   tamsulosin 0.4 MG Caps capsule Commonly known as: FLOMAX Take 0.4 mg by mouth at bedtime.            Home Infusion Instuctions   (From admission, onward)         Start     Ordered   03/23/19 0000  Home infusion instructions    Question:  Instructions  Answer:  Flushing of vascular access device: 0.9% NaCl pre/post medication administration and prn patency; Heparin 100 u/ml, 53m for implanted ports and Heparin 10u/ml, 530mfor all other central venous catheters.   03/23/19 1250   03/23/19 0000  Home infusion instructions    Question:  Instructions  Answer:  Flushing of vascular access device: 0.9% NaCl pre/post medication administration and prn patency; Heparin 100 u/ml, 57m54mor implanted ports and Heparin 10u/ml, 57ml157mr all other central venous catheters.   03/23/19 1318          Contact information for follow-up providers    [facility].   Specialties: Interventional Radiology, Radiology Why: Schedulers will contact you with date and time of follow-up appointment.  Contact information: [address] [phone]    [facility]   Why: Follow up within 1-2 weeks  Contact information: [phone]        Contact information for after-discharge care    [facility]   Service: Skilled Nursing Contact information: [address] [place] [place] [phone]             Allergies  Allergen Reactions  . Fish Oil Swelling and Other (See Comments)         Consultations:  Nephrology  Urology  Interventional Radiology   Infectious Diseases   Procedures/Studies: CT ABDOMEN PELVIS W CONTRAST  Result Date: 03/23/2019 CLINICAL DATA:  History of left nephrectomy and left retroperitoneal abscess, interval drain placement EXAM: CT ABDOMEN AND PELVIS WITH CONTRAST TECHNIQUE: Multidetector CT imaging of the abdomen and pelvis was performed using the standard protocol following bolus  administration of intravenous contrast. CONTRAST:  100mL24mIPAQUE IOHEXOL 300 MG/ML  SOLN COMPARISON:  03/12/2019 FINDINGS: Lower chest: Persistent small left pleural effusion and left basilar atelectasis. Hepatobiliary: No focal liver abnormality is seen. Status post cholecystectomy. No biliary dilatation. Pancreas: Diffuse pancreatic atrophy again noted, with multiple coarse parenchymal calcifications consistent with sequela of chronic calcific pancreatitis. Stable 2.3 cm cyst within the pancreatic body consistent with pseudocyst. Spleen: Stable enlargement  of the spleen. No focal parenchymal abnormalities. Adrenals/Urinary Tract: Stable nonobstructing 13 mm calculus within the right renal pelvis. Small right renal cortical cyst is noted unchanged. Otherwise the right kidney is unremarkable. The left kidney is surgically absent. The left-sided retroperitoneal abscess seen on prior study has been drained in the interim, with pigtail drainage catheter in place. There is persistent gas and fluid superior and medial to the drainage catheter, measuring 3.8 x 3.3 cm reference image 39. Overall, significant improvement since prior study. Bladder is decompressed which limits its evaluation. No adrenal masses. Stomach/Bowel: No bowel obstruction or ileus. Persistent large amount of stool within the rectal vault. Vascular/Lymphatic: Multiple retroperitoneal lymph nodes are again noted, likely reactive. No vascular abnormalities. Reproductive: Prostate is unremarkable. Other: Diffuse anasarca again noted. No free intraperitoneal fluid or free gas. Musculoskeletal: No acute or destructive bony lesions. Postsurgical changes left hip. Reconstructed images demonstrate no additional findings. IMPRESSION: 1. Significant improvement in the left-sided retroperitoneal abscess after pigtail drainage catheter placement. A small amount of gas and fluid persist along the superomedial aspect of the prior fluid collection. 2. Stable  nonobstructing right renal calculus. 3. Large amount of stool within the distal sigmoid colon and rectum. 4. Stable trace left pleural effusion. 5. Anasarca. 6. Stable splenomegaly. 7. Stable sequela of chronic calcific pancreatitis. Electronically Signed   By: Randa Ngo M.D.   On: 03/23/2019 01:45   CT ABDOMEN PELVIS W CONTRAST  Result Date: 03/12/2019 CLINICAL DATA:  Pyelonephritis, sepsis, renal stent, left nephrectomy EXAM: CT ABDOMEN AND PELVIS WITH CONTRAST TECHNIQUE: Multidetector CT imaging of the abdomen and pelvis was performed using the standard protocol following bolus administration of intravenous contrast. CONTRAST:  163m OMNIPAQUE IOHEXOL 300 MG/ML  SOLN COMPARISON:  02/04/2019 FINDINGS: Lower chest: Small bilateral pleural effusions. Coronary artery calcifications and/or stents. Hepatobiliary: No focal liver abnormality is seen. Status post cholecystectomy. No biliary dilatation. Pancreas: Atrophic pancreas with parenchymal calcifications, in keeping with chronic stigmata of pancreatitis. No pancreatic ductal dilatation or surrounding inflammatory changes. Spleen: Splenomegaly. Adrenals/Urinary Tract: Adrenal glands are unremarkable. Redemonstrated postoperative findings of left nephrectomy. There has been interval enlargement of a left retroperitoneal air and fluid collection involving the left psoas, now measuring at least 17.4 x 5.9 cm (series 3, image 53). A previously seen right-sided double-J ureteral stent is been removed. A 1.3 cm calculus remains in the right renal pelvis with additional small right-sided calyceal calculi. Bladder is unremarkable. Stomach/Bowel: Stomach is within normal limits. No evidence of bowel wall thickening, distention, or inflammatory changes. Large stool balls in the distal sigmoid and rectum. Vascular/Lymphatic: No significant vascular findings are present. Multiple prominent retroperitoneal lymph nodes similar to prior examination. Reproductive: No mass  or other significant abnormality. Other: Anasarca. No abdominopelvic ascites. Musculoskeletal: No acute or significant osseous findings. Intramedullary nail fixation of the left femoral neck. IMPRESSION: 1. Status post left nephrectomy. Interval enlargement of left retroperitoneal air and fluid collection involving the left psoas, now measuring at least 17.4 x 5.9 cm and consistent with worsened abscess. 2. Interval removal of right-sided double-J ureteral stent. A 1.3 cm calculus remains in the right renal pelvis with additional small right-sided calyceal calculi. No hydronephrosis. 3. Large stool balls in the distal sigmoid and rectum. 4. Small bilateral pleural effusions. 5. Anasarca. 6. Splenomegaly. 7. Calcific stigmata of chronic pancreatitis. Electronically Signed   By: AEddie CandleM.D.   On: 03/12/2019 23:19   IR Fluoro Guide CV Line Right  Result Date: 03/23/2019 INDICATION: 49year old male with a  history of infection referred for tunneled central venous catheter. EXAM: TUNNELED PICC LINE WITH ULTRASOUND AND FLUOROSCOPIC GUIDANCE MEDICATIONS: None ANESTHESIA/SEDATION: None FLUOROSCOPY TIME:  Fluoroscopy Time: 0 minutes 30 seconds (5 mGy). COMPLICATIONS: None PROCEDURE: After written informed consent was obtained, patient was placed in the supine position on angiographic table. Patency of the right internal jugular vein was confirmed with ultrasound with image documentation. Patient was prepped and draped in the usual sterile fashion including the right neck and right superior chest. Using ultrasound guidance, the skin and subcutaneous tissues overlying the right internal jugular vein were generously infiltrated with 1% lidocaine without epinephrine. Using ultrasound guidance, the right internal jugular vein was punctured with a micropuncture needle, and an 018 wire was advanced into the right heart confirming venous access. A small stab incision was made with an 11 blade scalpel. Peel-away sheath was  placed over the wire, and then the wire was removed, marking the wire for estimation of internal catheter length. The chest wall was then generously infiltrated with 1% lidocaine for local anesthesia along the tissue tract. Small stab incision was made with 11 blade scalpel, and then the catheter was back tunneled to the puncture site at the right internal jugular vein. Catheter was pulled through the tract, with the catheter amputated at 20 cm. Catheter was advanced through the peel-away sheath, and the peel-away sheath was removed. Final image was stored. The catheter was anchored to the chest wall with 2 retention sutures, and Derma bond was used to seal the right internal jugular vein incision site and at the right chest wall. Patient tolerated the procedure well and remained hemodynamically stable throughout. No complications were encountered and no significant blood loss was encountered. FINDINGS: After catheter placement, the tip lies within the superior cavoatrial junction. The catheter aspirates and flushes normally and is ready for immediate use. IMPRESSION: Status post right IJ tunneled cuffed central venous catheter. Signed, Dulcy Fanny. Dellia Nims, RPVI [facility] Radiology Electronically Signed   By: Corrie Mckusick D.O.   On: 03/23/2019 08:34   IR Fluoro Guide CV Line Right  Result Date: 03/18/2019 INDICATION: 49 year old male with a history of renal failure, line holiday EXAM: TUNNELED CENTRAL VENOUS HEMODIALYSIS CATHETER PLACEMENT WITH ULTRASOUND AND FLUOROSCOPIC GUIDANCE MEDICATIONS: 2 g Ancef. The antibiotic was given in an appropriate time interval prior to skin puncture. ANESTHESIA/SEDATION: Moderate (conscious) sedation was employed during this procedure. A total of Versed 0.5 mg and Fentanyl 12.5 mcg was administered intravenously. Moderate Sedation Time: 15 minutes. The patient's level of consciousness and vital signs were monitored continuously  by radiology nursing throughout the procedure under my direct supervision. FLUOROSCOPY TIME:  Fluoroscopy Time: 1 minutes 0 seconds (7 mGy). COMPLICATIONS: None PROCEDURE: Informed written consent was obtained from the patient after a discussion of the risks, benefits, and alternatives to treatment. Questions regarding the procedure were encouraged and answered. The right neck and chest were prepped with chlorhexidine in a sterile fashion, and a sterile drape was applied covering the operative field. Maximum barrier sterile technique with sterile gowns and gloves were used for the procedure. A timeout was performed prior to the initiation of the procedure. After creating a small venotomy incision, a micropuncture kit was utilized to access the right internal jugular vein under direct, real-time ultrasound guidance after the overlying soft tissues were anesthetized with 1% lidocaine with epinephrine. Ultrasound image documentation was performed. The microwire was marked to measure appropriate internal catheter length. External tunneled length was estimated. A  total tip to cuff length of 19 cm was selected. Skin and subcutaneous tissues of chest wall below the clavicle were generously infiltrated with 1% lidocaine for local anesthesia. A small stab incision was made with 11 blade scalpel. The selected hemodialysis catheter was tunneled in a retrograde fashion from the anterior chest wall to the venotomy incision. A guidewire was advanced to the level of the IVC and the micropuncture sheath was exchanged for a peel-away sheath. The catheter was then placed through the peel-away sheath with tips ultimately positioned within the superior aspect of the right atrium. Final catheter positioning was confirmed and documented with a spot radiographic image. The catheter aspirates and flushes normally. The catheter was flushed with appropriate volume heparin dwells. The catheter exit site was secured with a 0-Prolene retention  suture. The venotomy incision was closed Derma bond and sterile dressing. Dressings were applied at the chest wall. Patient tolerated the procedure well and remained hemodynamically stable throughout. No complications were encountered and no significant blood loss encountered. IMPRESSION: Status post right IJ tunneled hemodialysis catheter. Catheter ready for use. Signed, Dulcy Fanny. Dellia Nims, RPVI [facility] Radiology Electronically Signed   By: Corrie Mckusick D.O.   On: 03/18/2019 12:28   IR Removal Tun Cv Cath W/O FL  Result Date: 03/15/2019 INDICATION: End-stage renal disease on hemodialysis. Retroperitoneal abscesses status post drain placement. Bacteremia. Request for removal of tunneled hemodialysis catheter. EXAM: REMOVAL OF TUNNELED HEMODIALYSIS CATHETER MEDICATIONS: 1% lidocaine 2 mL COMPLICATIONS: None immediate. PROCEDURE: Informed written consent was obtained from the patient following an explanation of the procedure, risks, benefits and alternatives to treatment. A time out was performed prior to the initiation of the procedure. Maximal barrier sterile technique was utilized including caps, mask, sterile gowns, sterile gloves, large sterile drape, hand hygiene, and ChloraPrep. 1% lidocaine with epinephrine was injected under sterile conditions along the subcutaneous tunnel. Utilizing a combination of blunt dissection and gentle traction, the catheter was removed intact. Hemostasis was obtained with manual compression. A dressing was placed. The patient tolerated the procedure well without immediate post procedural complication. IMPRESSION: Successful removal of tunneled dialysis catheter. Read by: Gareth Eagle, [name] Electronically Signed   By: Jerilynn Mages.  Shick M.D.   On: 03/15/2019 14:21   IR US Guide Vasc Access Right  Result Date: 03/23/2019 INDICATION: 49 year old male with a history of infection referred for tunneled central venous catheter. EXAM:  TUNNELED PICC LINE WITH ULTRASOUND AND FLUOROSCOPIC GUIDANCE MEDICATIONS: None ANESTHESIA/SEDATION: None FLUOROSCOPY TIME:  Fluoroscopy Time: 0 minutes 30 seconds (5 mGy). COMPLICATIONS: None PROCEDURE: After written informed consent was obtained, patient was placed in the supine position on angiographic table. Patency of the right internal jugular vein was confirmed with ultrasound with image documentation. Patient was prepped and draped in the usual sterile fashion including the right neck and right superior chest. Using ultrasound guidance, the skin and subcutaneous tissues overlying the right internal jugular vein were generously infiltrated with 1% lidocaine without epinephrine. Using ultrasound guidance, the right internal jugular vein was punctured with a micropuncture needle, and an 018 wire was advanced into the right heart confirming venous access. A small stab incision was made with an 11 blade scalpel. Peel-away sheath was placed over the wire, and then the wire was removed, marking the wire for estimation of internal catheter length. The chest wall was then generously infiltrated with 1% lidocaine for local anesthesia along the tissue tract. Small stab incision was made with 11 blade scalpel, and then  the catheter was back tunneled to the puncture site at the right internal jugular vein. Catheter was pulled through the tract, with the catheter amputated at 20 cm. Catheter was advanced through the peel-away sheath, and the peel-away sheath was removed. Final image was stored. The catheter was anchored to the chest wall with 2 retention sutures, and Derma bond was used to seal the right internal jugular vein incision site and at the right chest wall. Patient tolerated the procedure well and remained hemodynamically stable throughout. No complications were encountered and no significant blood loss was encountered. FINDINGS: After catheter placement, the tip lies within the superior cavoatrial junction. The  catheter aspirates and flushes normally and is ready for immediate use. IMPRESSION: Status post right IJ tunneled cuffed central venous catheter. Signed, Dulcy Fanny. Dellia Nims, RPVI [facility] Electronically Signed   By: Corrie Mckusick D.O.   On: 03/23/2019 08:34   IR US Guide Vasc Access Right  Result Date: 03/18/2019 INDICATION: 49 year old male with a history of renal failure, line holiday EXAM: TUNNELED CENTRAL VENOUS HEMODIALYSIS CATHETER PLACEMENT WITH ULTRASOUND AND FLUOROSCOPIC GUIDANCE MEDICATIONS: 2 g Ancef. The antibiotic was given in an appropriate time interval prior to skin puncture. ANESTHESIA/SEDATION: Moderate (conscious) sedation was employed during this procedure. A total of Versed 0.5 mg and Fentanyl 12.5 mcg was administered intravenously. Moderate Sedation Time: 15 minutes. The patient's level of consciousness and vital signs were monitored continuously by radiology nursing throughout the procedure under my direct supervision. FLUOROSCOPY TIME:  Fluoroscopy Time: 1 minutes 0 seconds (7 mGy). COMPLICATIONS: None PROCEDURE: Informed written consent was obtained from the patient after a discussion of the risks, benefits, and alternatives to treatment. Questions regarding the procedure were encouraged and answered. The right neck and chest were prepped with chlorhexidine in a sterile fashion, and a sterile drape was applied covering the operative field. Maximum barrier sterile technique with sterile gowns and gloves were used for the procedure. A timeout was performed prior to the initiation of the procedure. After creating a small venotomy incision, a micropuncture kit was utilized to access the right internal jugular vein under direct, real-time ultrasound guidance after the overlying soft tissues were anesthetized with 1% lidocaine with epinephrine. Ultrasound image documentation was performed. The microwire was marked to measure  appropriate internal catheter length. External tunneled length was estimated. A total tip to cuff length of 19 cm was selected. Skin and subcutaneous tissues of chest wall below the clavicle were generously infiltrated with 1% lidocaine for local anesthesia. A small stab incision was made with 11 blade scalpel. The selected hemodialysis catheter was tunneled in a retrograde fashion from the anterior chest wall to the venotomy incision. A guidewire was advanced to the level of the IVC and the micropuncture sheath was exchanged for a peel-away sheath. The catheter was then placed through the peel-away sheath with tips ultimately positioned within the superior aspect of the right atrium. Final catheter positioning was confirmed and documented with a spot radiographic image. The catheter aspirates and flushes normally. The catheter was flushed with appropriate volume heparin dwells. The catheter exit site was secured with a 0-Prolene retention suture. The venotomy incision was closed Derma bond and sterile dressing. Dressings were applied at the chest wall. Patient tolerated the procedure well and remained hemodynamically stable throughout. No complications were encountered and no significant blood loss encountered. IMPRESSION: Status post right IJ tunneled hemodialysis catheter. Catheter ready for use. Signed, Dulcy Fanny. Earleen Newport DO, RPVI Vascular and  Interventional Radiology Specialists [facility] Electronically Signed   By: Corrie Mckusick D.O.   On: 03/18/2019 12:28   DG Chest Port 1 View  Result Date: 03/12/2019 CLINICAL DATA:  49 year old male with chest pain shortness of breath fever nausea vomiting. EXAM: PORTABLE CHEST 1 VIEW COMPARISON:  Portable chest 12/25/2018 and earlier. FINDINGS: Portable AP semi upright view at 2234 hours. Lower lung volumes. Mediastinal contours remain within normal limits. Stable dual lumen right chest dialysis type catheter. Visualized tracheal air column is within normal  limits. Crowding of lung markings. No pneumothorax, pleural effusion or consolidation. Increased pulmonary vascular congestion but no overt edema. No acute osseous abnormality identified. IMPRESSION: Lower lung volumes and increased pulmonary vascular congestion since December. No other acute cardiopulmonary abnormality. Electronically Signed   By: Genevie Ann M.D.   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 EXAM: CT-GUIDED PLACEMENT OF LEFT RETROPERITONEAL ABSCESS DRAIN X2 MEDICATIONS: The patient is currently admitted to the hospital and receiving intravenous antibiotics. ANESTHESIA/SEDATION: Fentanyl 50 mcg IV; Versed 1.0 mg IV Moderate Sedation Time:  45 minutes The patient was continuously monitored during the procedure by the interventional radiology nurse under my direct supervision. COMPLICATIONS: None immediate. PROCEDURE: Informed written consent was obtained from the patient after a thorough discussion of the procedural risks, benefits and alternatives. All questions were addressed. Maximal Sterile Barrier Technique was utilized including caps, mask, sterile gowns, sterile gloves, sterile drape, hand hygiene and skin antiseptic. A timeout was performed prior to the initiation of the procedure. Patient was placed prone. CT images through the abdomen were obtained. Large air-fluid collection in the left retroperitoneum. The left flank was prepped with chlorhexidine and sterile field was created. Skin and soft tissues were anesthetized with 1% lidocaine. Using CT guidance, 18 gauge trocar needle was directed into the large collection. Yellow purulent fluid was aspirated. Superstiff Amplatz wire was placed through the needle and the tract was dilated to accommodate a 14 [place] biliary type drain. Large amount of yellow purulent fluid was removed. Follow up CT images  were obtained. CT images demonstrated residual air-fluid collection posterior to the spleen and upper portion of the collection. Skin was anesthetized with 1% lidocaine and a new incision was made. New 18 gauge trocar needle was directed into the upper portion of the residual collection with CT guidance. Large amount of air was aspirated. Superstiff Amplatz wire was advanced into the collection and the tract was dilated to accommodate a 12 [place] multipurpose drain. Large amount of gas and bloody purulent fluid was aspirated. Follow up CT images were obtained. Both catheters sutured to skin and attached to a suction bulb. StatLock was placed. FINDINGS: Large complex air-fluid collection in the left retroperitoneum with some extension into the left lateral subcutaneous tissues. A 14 French biliary type drain was placed in the midportion of the collection. 430 mL of yellow purulent fluid was removed from this drain. Follow up CT images demonstrated residual air-fluid collection along the superior aspect of the collection posterior to the spleen. Second drain was placed in the superior aspect of the collection and 100 mL purulent fluid and a large amount of gas was removed. Final CT images demonstrate marked decompression of the abscess cavities. A small amount of residual gas and fluid. Again noted is high-density material in the right renal collecting system compatible with nephrolithiasis. There is a focal stone at the  right UPJ that measures roughly 1.3 cm. IMPRESSION: CT-guided placement of two percutaneous drains within the large left retroperitoneal abscess. Total of 530 mL of purulent fluid was removed from the abscess. Fluid was sent for culture. Patient will need follow-up CT images when the drainage decreases to look for any residual collections. Electronically Signed   By: Markus Daft M.D.   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 EXAM: CT-GUIDED PLACEMENT OF LEFT RETROPERITONEAL ABSCESS DRAIN X2 MEDICATIONS: The patient is currently admitted to the hospital and receiving intravenous antibiotics. ANESTHESIA/SEDATION: Fentanyl 50 mcg IV; Versed 1.0 mg IV Moderate Sedation Time:  45 minutes The patient was continuously monitored during the procedure by the interventional radiology nurse under my direct supervision. COMPLICATIONS: None immediate. PROCEDURE: Informed written consent was obtained from the patient after a thorough discussion of the procedural risks, benefits and alternatives. All questions were addressed. Maximal Sterile Barrier Technique was utilized including caps, mask, sterile gowns, sterile gloves, sterile drape, hand hygiene and skin antiseptic. A timeout was performed prior to the initiation of the procedure. Patient was placed prone. CT images through the abdomen were obtained. Large air-fluid collection in the left retroperitoneum. The left flank was prepped with chlorhexidine and sterile field was created. Skin and soft tissues were anesthetized with 1% lidocaine. Using CT guidance, 18 gauge trocar needle was directed into the large collection. Yellow purulent fluid was aspirated. Superstiff Amplatz wire was placed through the needle and the tract was dilated to accommodate a 14 [place] biliary type drain. Large amount of yellow purulent fluid was removed. Follow up CT images were obtained. CT images demonstrated residual air-fluid collection posterior to the spleen and upper portion of the collection. Skin was anesthetized with 1% lidocaine and a new incision was made. New 18 gauge trocar needle was directed into the upper portion of the residual collection with CT guidance. Large amount of air was aspirated. Superstiff Amplatz wire was advanced into the collection and the tract was dilated to accommodate a 12 [place] multipurpose drain.  Large amount of gas and bloody purulent fluid was aspirated. Follow up CT images were obtained. Both catheters sutured to skin and attached to a suction bulb. StatLock was placed. FINDINGS: Large complex air-fluid collection in the left retroperitoneum with some extension into the left lateral subcutaneous tissues. A 14 French biliary type drain was placed in the midportion of the collection. 430 mL of yellow purulent fluid was removed from this drain. Follow up CT images demonstrated residual air-fluid collection along the superior aspect of the collection posterior to the spleen. Second drain was placed in the superior aspect of the collection and 100 mL purulent fluid and a large amount of gas was removed. Final CT images demonstrate marked decompression of the abscess cavities. A small amount of residual gas and fluid. Again noted is high-density material in the right renal collecting system compatible with nephrolithiasis. There is a focal stone at the right UPJ that measures roughly 1.3 cm. IMPRESSION: CT-guided placement of two percutaneous drains within the large left retroperitoneal abscess. Total of 530 mL of purulent fluid was removed from the abscess. Fluid was sent for culture. Patient will need follow-up CT images when the drainage decreases to look for any residual collections. Electronically Signed   By: Markus Daft M.D.   On: 03/14/2019 14:10    2/17 HD, on MWF schedule 2/18  CT-guided drain placement x2 2/19 HD catheter removal 2/22 R IJ tunneled dialysis catheter placed by IR 2/26/ R IJ CVC   Subjective: Examined at bedside and states that he is sleepy as he did not get much sleep last night due to him going down for his CT scan late last night.  States he started sleeping at 4:00 and wanted to rest.  Still complains of pain but not as bad.  No nausea or vomiting.  No other concerns or complaints at this time is deemed stable for discharge will need to follow-up with infectious diseases,  interventional radiology, nephrology, as well as urology in the outpatient setting and patient is understandable agreeable with the plan of care.   Discharge Exam: Vitals:   03/22/19 2100 03/23/19 0511  BP: 139/80 116/74  Pulse: 82 71  Resp: 17 17  Temp: 98.4 F (36.9 C) 98.1 F (36.7 C)  SpO2: 100% 99%   Vitals:   03/22/19 1225 03/22/19 1330 03/22/19 2100 03/23/19 0511  BP: 109/62 110/64 139/80 116/74  Pulse: 82 87 82 71  Resp: 14 17 17 17   Temp: 97.9 F (36.6 C) 98.2 F (36.8 C) 98.4 F (36.9 C) 98.1 F (36.7 C)  TempSrc: Oral Oral Oral Oral  SpO2: 95% 100% 100% 99%  Weight:      Height:       General: Pt is alert, awake, not in acute distress Cardiovascular: RRR, S1/S2 +, no rubs, no gallops Respiratory: Slightly diminished bilaterally, no wheezing, no rhonchi Abdominal: Soft, mildly tender, distended secondary body habitus, bowel sounds + Extremities: 1+ edema, no cyanosis  The results of significant diagnostics from this hospitalization (including imaging, microbiology, ancillary and laboratory) are listed below for reference.    Microbiology: Recent Results (from the past 240 hour(s))  Aerobic/Anaerobic Culture (surgical/deep wound)     Status: None   Collection Time: 03/14/19 11:01 AM   Specimen: Abscess  Result Value Ref Range Status   Specimen Description ABSCESS  Final   Special Requests NONE  Final   Gram Stain   Final    ABUNDANT WBC PRESENT, PREDOMINANTLY PMN NO ORGANISMS SEEN    Culture   Final    MODERATE KLEBSIELLA PNEUMONIAE NO ANAEROBES ISOLATED Performed at [facility], [address], [place], [place] [address]    Report Status 03/19/2019 FINAL  Final   Organism ID, Bacteria KLEBSIELLA PNEUMONIAE  Final      Susceptibility   Klebsiella pneumoniae - MIC*    AMPICILLIN >=32 RESISTANT Resistant     CEFAZOLIN 16 SENSITIVE Sensitive     CEFEPIME <=0.12 SENSITIVE Sensitive     CEFTAZIDIME <=1 SENSITIVE Sensitive     CEFTRIAXONE <=0.25  SENSITIVE Sensitive     CIPROFLOXACIN 1 SENSITIVE Sensitive     GENTAMICIN 8 INTERMEDIATE Intermediate     IMIPENEM 1 SENSITIVE Sensitive     TRIMETH/SULFA >=320 RESISTANT Resistant     AMPICILLIN/SULBACTAM >=32 RESISTANT Resistant     PIP/TAZO 64 INTERMEDIATE Intermediate     * MODERATE KLEBSIELLA PNEUMONIAE  SARS CORONAVIRUS 2 (TAT 6-24 HRS) Nasopharyngeal Nasopharyngeal Swab     Status: None   Collection Time: 03/19/19  6:50 PM   Specimen: Nasopharyngeal Swab  Result Value Ref Range Status   SARS Coronavirus 2 NEGATIVE NEGATIVE Final    Comment: (NOTE) SARS-CoV-2 target nucleic acids are NOT DETECTED. The SARS-CoV-2 RNA is generally detectable in upper and lower respiratory specimens during the acute phase of infection. Negative results do not preclude SARS-CoV-2  infection, do not rule out co-infections with other pathogens, and should not be used as the sole basis for treatment or other patient management decisions. Negative results must be combined with clinical observations, patient history, and epidemiological information. The expected result is Negative. Fact Sheet for Patients: SugarRoll.be Fact Sheet for Healthcare Providers: https://www.woods-mathews.com/ This test is not yet approved or cleared by the [place] FDA and  has been authorized for detection and/or diagnosis of SARS-CoV-2 by FDA under an Emergency Use Authorization (EUA). This EUA will remain  in effect (meaning this test can be used) for the duration of the COVID-19 declaration under Section 56 4(b)(1) of the Act, 21 U.S.C. section 360bbb-3(b)(1), unless the authorization is terminated or revoked sooner. Performed at [facility], [place] [address], [place], [place] [address]   SARS CORONAVIRUS 2 (TAT 6-24 HRS) Nasopharyngeal Nasopharyngeal Swab     Status: None   Collection Time: 03/22/19 10:28 AM   Specimen: Nasopharyngeal Swab  Result Value Ref Range  Status   SARS Coronavirus 2 NEGATIVE NEGATIVE Final    Comment: (NOTE) SARS-CoV-2 target nucleic acids are NOT DETECTED. The SARS-CoV-2 RNA is generally detectable in upper and lower respiratory specimens during the acute phase of infection. Negative results do not preclude SARS-CoV-2 infection, do not rule out co-infections with other pathogens, and should not be used as the sole basis for treatment or other patient management decisions. Negative results must be combined with clinical observations, patient history, and epidemiological information. The expected result is Negative. Fact Sheet for Patients: SugarRoll.be Fact Sheet for Healthcare Providers: https://www.woods-mathews.com/ This test is not yet approved or cleared by the [place] FDA and  has been authorized for detection and/or diagnosis of SARS-CoV-2 by FDA under an Emergency Use Authorization (EUA). This EUA will remain  in effect (meaning this test can be used) for the duration of the COVID-19 declaration under Section 56 4(b)(1) of the Act, 21 U.S.C. section 360bbb-3(b)(1), unless the authorization is terminated or revoked sooner. Performed at [facility], [place] [address], [place], [place] [address]     Labs: BNP (last 3 results) Recent Labs    04/21/18 2209  BNP 179.1*   Basic Metabolic Panel: Recent Labs  Lab 03/17/19 0259 03/18/19 0559 03/19/19 0122 03/20/19 0152 03/21/19 0146 03/22/19 0257 03/23/19 0503  NA 137   < > 138 135 134* 136 133*  K 3.0*   < > 3.5 3.7 3.4* 3.7 3.4*  CL 102   < > 105 104 99 100 102  CO2 25   < > 25 21* 26 22 25   GLUCOSE 250*   < > 154* 283* 140* 159* 227*  BUN 33*   < > 25* 41* 28* 47* 35*  CREATININE 4.01*   < > 2.80* 3.89* 2.74* 3.97* 3.10*  CALCIUM 7.7*   < > 7.5* 7.6* 7.7* 7.9* 7.5*  MG  --   --   --   --  2.1 2.6* 2.1  PHOS 3.6  --   --   --  3.9 6.6* 4.7*   < > = values in this interval not displayed.   Liver  Function Tests: Recent Labs  Lab 03/17/19 0259 03/21/19 0146 03/22/19 0257 03/23/19 0503  AST  --  16 24 25   ALT  --  9 8 10   ALKPHOS  --  122 111 109  BILITOT  --  0.4 0.6 0.6  PROT  --  6.2* 5.9* 5.5*  ALBUMIN 1.5* 1.8* 1.8*  1.7*   No results for input(s): LIPASE, AMYLASE in the last 168 hours. No results for input(s): AMMONIA in the last 168 hours. CBC: Recent Labs  Lab 03/18/19 0559 03/18/19 0559 03/19/19 0122 03/20/19 0152 03/21/19 0146 03/22/19 0257 03/23/19 0503  WBC 3.7*   < > 4.5 4.1 5.9 4.3 4.0  NEUTROABS 2.3  --   --   --  3.9 2.7 2.5  HGB 8.0*   < > 8.1* 8.4* 8.4* 8.1* 7.8*  HCT 25.2*   < > 25.3* 26.4* 25.9* 25.2* 24.2*  MCV 95.8   < > 93.7 94.3 92.8 94.0 94.2  PLT 133*   < > 154 149* 146* 135* 144*   < > = values in this interval not displayed.   Cardiac Enzymes: No results for input(s): CKTOTAL, CKMB, CKMBINDEX, TROPONINI in the last 168 hours. BNP: Invalid input(s): POCBNP CBG: Recent Labs  Lab 03/22/19 1324 03/22/19 1625 03/22/19 2058 03/23/19 0741 03/23/19 1158  GLUCAP 178* 369* 178* 211* 81   D-Dimer No results for input(s): DDIMER in the last 72 hours. Hgb A1c No results for input(s): HGBA1C in the last 72 hours. Lipid Profile No results for input(s): CHOL, HDL, LDLCALC, TRIG, CHOLHDL, LDLDIRECT in the last 72 hours. Thyroid function studies No results for input(s): TSH, T4TOTAL, T3FREE, THYROIDAB in the last 72 hours.  Invalid input(s): FREET3 Anemia work up No results for input(s): VITAMINB12, FOLATE, FERRITIN, TIBC, IRON, RETICCTPCT in the last 72 hours. Urinalysis    Component Value Date/Time   COLORURINE YELLOW 03/13/2019 [place] 03/13/2019 0837   LABSPEC 1.014 03/13/2019 0837   PHURINE 6.0 03/13/2019 0837   GLUCOSEU NEGATIVE 03/13/2019 0837   HGBUR NEGATIVE 03/13/2019 0837   [place] NEGATIVE 03/13/2019 0837   [place] 03/13/2019 0837   PROTEINUR 30 (A) 03/13/2019 0837   NITRITE NEGATIVE  03/13/2019 0837   LEUKOCYTESUR TRACE (A) 03/13/2019 0837   Sepsis Labs Invalid input(s): PROCALCITONIN,  WBC,  LACTICIDVEN Microbiology Recent Results (from the past 240 hour(s))  Aerobic/Anaerobic Culture (surgical/deep wound)     Status: None   Collection Time: 03/14/19 11:01 AM   Specimen: Abscess  Result Value Ref Range Status   Specimen Description ABSCESS  Final   Special Requests NONE  Final   Gram Stain   Final    ABUNDANT WBC PRESENT, PREDOMINANTLY PMN NO ORGANISMS SEEN    Culture   Final    MODERATE KLEBSIELLA PNEUMONIAE NO ANAEROBES ISOLATED Performed at [facility], [place] [address] [place], [place] [address]    Report Status 03/19/2019 FINAL  Final   Organism ID, Bacteria KLEBSIELLA PNEUMONIAE  Final      Susceptibility   Klebsiella pneumoniae - MIC*    AMPICILLIN >=32 RESISTANT Resistant     CEFAZOLIN 16 SENSITIVE Sensitive     CEFEPIME <=0.12 SENSITIVE Sensitive     CEFTAZIDIME <=1 SENSITIVE Sensitive     CEFTRIAXONE <=0.25 SENSITIVE Sensitive     CIPROFLOXACIN 1 SENSITIVE Sensitive     GENTAMICIN 8 INTERMEDIATE Intermediate     IMIPENEM 1 SENSITIVE Sensitive     TRIMETH/SULFA >=320 RESISTANT Resistant     AMPICILLIN/SULBACTAM >=32 RESISTANT Resistant     PIP/TAZO 64 INTERMEDIATE Intermediate     * MODERATE KLEBSIELLA PNEUMONIAE  SARS CORONAVIRUS 2 (TAT 6-24 HRS) Nasopharyngeal Nasopharyngeal Swab     Status: None   Collection Time: 03/19/19  6:50 PM   Specimen: Nasopharyngeal Swab  Result Value Ref Range Status   SARS Coronavirus  2 NEGATIVE NEGATIVE Final    Comment: (NOTE) SARS-CoV-2 target nucleic acids are NOT DETECTED. The SARS-CoV-2 RNA is generally detectable in upper and lower respiratory specimens during the acute phase of infection. Negative results do not preclude SARS-CoV-2 infection, do not rule out co-infections with other pathogens, and should not be used as the sole basis for treatment or other patient management  decisions. Negative results must be combined with clinical observations, patient history, and epidemiological information. The expected result is Negative. Fact Sheet for Patients: SugarRoll.be Fact Sheet for Healthcare Providers: https://www.woods-mathews.com/ This test is not yet approved or cleared by the [place] FDA and  has been authorized for detection and/or diagnosis of SARS-CoV-2 by FDA under an Emergency Use Authorization (EUA). This EUA will remain  in effect (meaning this test can be used) for the duration of the COVID-19 declaration under Section 56 4(b)(1) of the Act, 21 U.S.C. section 360bbb-3(b)(1), unless the authorization is terminated or revoked sooner. Performed at [facility], [place] [address] [place], [place] [address]   SARS CORONAVIRUS 2 (TAT 6-24 HRS) Nasopharyngeal Nasopharyngeal Swab     Status: None   Collection Time: 03/22/19 10:28 AM   Specimen: Nasopharyngeal Swab  Result Value Ref Range Status   SARS Coronavirus 2 NEGATIVE NEGATIVE Final    Comment: (NOTE) SARS-CoV-2 target nucleic acids are NOT DETECTED. The SARS-CoV-2 RNA is generally detectable in upper and lower respiratory specimens during the acute phase of infection. Negative results do not preclude SARS-CoV-2 infection, do not rule out co-infections with other pathogens, and should not be used as the sole basis for treatment or other patient management decisions. Negative results must be combined with clinical observations, patient history, and epidemiological information. The expected result is Negative. Fact Sheet for Patients: SugarRoll.be Fact Sheet for Healthcare Providers: https://www.woods-mathews.com/ This test is not yet approved or cleared by the [place] FDA and  has been authorized for detection and/or diagnosis of SARS-CoV-2 by FDA under an Emergency Use Authorization (EUA). This  EUA will remain  in effect (meaning this test can be used) for the duration of the COVID-19 declaration under Section 56 4(b)(1) of the Act, 21 U.S.C. section 360bbb-3(b)(1), unless the authorization is terminated or revoked sooner. Performed at [facility], [place] [address], [place], [place] [address]    Time coordinating discharge: 35 minutes  SIGNED:  Kerney Elbe, DO [facility] 03/23/2019, 1:22 PM Pager is on [place]  If 7PM-7AM, please contact night-coverage [email or web address] Password [email or web address]

## 2019-03-23 NOTE — Progress Notes (Signed)
Pharmacy Antibiotic Note  Eddie Davies is a 49 y.o. male with recurrent/ongoing ESBL Kleb pneumo infection admitted on 03/12/2019 now with L retropeitoneal abscess.  Pharmacy was been consulted for meropenem dosing.  Patient is s/p [facility] replacement on 2/22. Plan to continue meropenem for 2 more weeks after line removal (3/5)followed by cefepime starting 3/6 for another 2 weeks.  2/17 meropenem  (3/5) [cefepime start 3/6 x 2 weeks per ID recs]  Plan: Continue meropenem 500 mg IV every 24 hours (stop date 3/5, OPAT entered)   Height: 6' 2"  (188 cm) Weight: 194 lb 7.1 oz (88.2 kg) IBW/kg (Calculated) : 82.2  Temp (24hrs), Avg:98.2 F (36.8 C), Min:97.9 F (36.6 C), Max:98.4 F (36.9 C)  Recent Labs  Lab 03/19/19 0122 03/20/19 0152 03/21/19 0146 03/22/19 0257 03/23/19 0503  WBC 4.5 4.1 5.9 4.3 4.0  CREATININE 2.80* 3.89* 2.74* 3.97* 3.10*    Estimated Creatinine Clearance: 33.9 mL/min (A) (by C-G formula based on SCr of 3.1 mg/dL (H)).    Allergies  Allergen Reactions  . Fish Oil Swelling and Other (See Comments)         Brendolyn Patty, PharmD PGY2 Pharmacy Resident Phone [phone]  03/23/2019   8:44 AM

## 2019-03-23 NOTE — TOC Transition Note (Signed)
Transition of Care [facility]) - CM/SW Discharge Note   Patient Details  Name: Jayvien Rowlette MRN: [id number] Date of Birth: [birth year]  [facility]) CM/SW Contact:  Gabrielle Dare Phone Number: 03/23/2019, 2:45 PM   Clinical Narrative:    Patient will Discharge To: [facility] Anticipated DC Date:March 23, 2019 Family Notified: Yes sister Earlie Lou, [phone] Transport BT:[facility]   Per MD patient ready for DC to [facility]. RN, patient, patient's family, and facility notified of DC. Assessment, Fl2/Pasrr, and Discharge Summary sent to facility. RN given number for report [phone]. Room 205). DC packet on chart. Ambulance transport requested for patient.   CSW signing off.  Reed Breech [facility] [phone]     Final next level of care: Skilled Nursing Facility Barriers to Discharge: Continued Medical Work up   Patient Goals and CMS Choice Patient states their goals for this hospitalization and ongoing recovery are:: return to [facility].gov Compare Post Acute Care list provided to:: (LTC resident) Choice offered to / list presented to : Patient  Discharge Placement              Patient chooses bed at: [facility]) Patient to be transferred to facility by: [place] Name of family member notified: Earlie Lou Patient and family notified of of transfer: 03/23/19  Discharge Plan and Services In-house Referral: Clinical Social Work Discharge Planning Services: CM Consult Post Acute Care Choice: [name], Resumption of Svcs/PTA Provider                               Social Determinants of Health (SDOH) Interventions     Readmission Risk Interventions Readmission Risk Prevention Plan 03/19/2019 11/29/2018 05/28/2018  Transportation Screening Complete Complete -  PCP or Specialist Appt within 5-7 Days - - -  Home Care Screening - - -  Medication Review (RN CM) - - -  Medication Review (RN  Transport planner) Referral to Pharmacy Complete -  PCP or Specialist appointment within 3-5 days of discharge Not Complete Not Complete -  PCP/Specialist Appt Not Complete comments LTC SNF resident pt is SNF resident -  [place] or [place] Not Complete Not Complete Complete  HRI or Home Care Consult Pt Refusal Comments LTC SNF resident pt is SNF resident -  SW Recovery Care/Counseling Consult Complete Complete Complete  Palliative Care Screening Not Applicable Not Applicable -  Skilled Nursing Facility Complete Complete -  Some recent data might be hidden

## 2019-03-25 ENCOUNTER — Other Ambulatory Visit (HOSPITAL_COMMUNITY): Payer: Self-pay | Admitting: Student

## 2019-03-25 DIAGNOSIS — K6819 Other retroperitoneal abscess: Secondary | ICD-10-CM

## 2019-04-04 ENCOUNTER — Ambulatory Visit (HOSPITAL_COMMUNITY)
Admission: RE | Admit: 2019-04-04 | Discharge: 2019-04-04 | Disposition: A | Discharge status: Home / Self Care | Payer: Medicare Other | Source: Ambulatory Visit | Attending: Student | Admitting: Student

## 2019-04-04 ENCOUNTER — Other Ambulatory Visit: Payer: Self-pay

## 2019-04-04 DIAGNOSIS — K6819 Other retroperitoneal abscess: Secondary | ICD-10-CM | POA: Insufficient documentation

## 2019-04-04 DIAGNOSIS — Z4803 Encounter for change or removal of drains: Secondary | ICD-10-CM | POA: Insufficient documentation

## 2019-04-04 HISTORY — PX: IR SINUS/FIST TUBE CHK-NON GI: IMG673

## 2019-04-04 MED ORDER — IOHEXOL 300 MG/ML  SOLN
50.0000 mL | Freq: Once | INTRAMUSCULAR | Status: AC | PRN
  Administered 2019-04-04: 10 mL

## 2019-04-04 MED ORDER — IOHEXOL 300 MG/ML  SOLN
75.0000 mL | Freq: Once | INTRAMUSCULAR | Status: AC | PRN
  Administered 2019-04-04: 75 mL via INTRAVENOUS

## 2019-04-04 MED ORDER — LIDOCAINE HCL 1 % IJ SOLN
INTRAMUSCULAR | Status: AC
  Filled 2019-04-04: qty 20

## 2019-04-04 NOTE — Procedures (Signed)
Pre procedural Dx: Left sided RP abscess. Post procedural Dx: Same  Contrast injection demonstrates opacification of decompressed abscess cavity without definitive evidence of an enteric fistula.   Given resolution of RP abscess on CT AP performed earlier same day and lack of a fistula, the decision was made to remove the remaining drain.  The drainage catheter was removed intact.  EBL: Trace Complications: None immediate  Ronny Bacon, MD Pager #: [phone]

## 2019-04-06 DIAGNOSIS — T7840XA Allergy, unspecified, initial encounter: Secondary | ICD-10-CM

## 2019-04-06 HISTORY — DX: Allergy, unspecified, initial encounter: T78.40XA

## 2019-04-10 DIAGNOSIS — E8779 Other fluid overload: Secondary | ICD-10-CM | POA: Insufficient documentation

## 2019-04-10 HISTORY — DX: Other fluid overload: E87.79

## 2019-04-11 DIAGNOSIS — E8770 Fluid overload, unspecified: Secondary | ICD-10-CM | POA: Insufficient documentation

## 2019-04-17 NOTE — Progress Notes (Signed)
[facility]    Provider:  Dr [name] Requesting Provider: [name], MD Primary Care Provider:  Practice, [facility] Family  CC:  Hx of epilepsy  HPI:  Eddie Davies is a 49 y.o. male here as requested by [name] MD for seizures. PMHx ESRD on HD, ESBL nephrectomy, type 2 diabetes, hypertension, heart failure, urea cycle metabolism disorder, noncompliance, obstructive sleep apnea, epilepsy, morbid severe obesity with hypoventilation, muscle wasting and atrophy, polyneuropathy, abnormalities of gait and mobility, hyperlipidemia, depression, anxiety, gout, tobacco use with recent admission for retroperitoneal abscess.  I reviewed Dr. Frutoso Schatz notes, there is no documentation on history of seizures in her documentation.  Patient had an EEG in March 2020 with showed a diffuse disturbance no epileptiform activity was noted(reviewed report).  I do not see any other notes from neurology in epic or in care everywhere.  He is on carbamazepine and phenobarbital but I cannot find any  history of seizures.  He has had seizures since the age of 12 when he was diagnosed with epilepsy after reported head injury, he states he have never lost consciousness, he says he remembers them all, he is a poor historian, he feels the medications work for him. He has been on the same medications for years, he is stable. He says his medications are given to him by staff. He says his seizures are everything goes in slow motion for 30 seconds at the most but no loss of consciousness or falls or generalized activity of clonic tonic movements. E does not have anymore information, again a poor historian.   Reviewed notes, labs and imaging from outside physicians, which showed:  02/20/2019: Carbamazepine level 5.7 (8-12) Phenobarb level 9.5 (15-40)  Reviewed CT Scan head report 03/14/2018:   1. Mild image degradation due to patient motion. Within this technical constraint, no acute  hemorrhage or evidence for acute large vessel territory ischemia. Although no evidence of acute infarction, mass, or hemorrhage is seen, CT is relatively insensitive for the detection of hypoxia/ischemia within the first 24-48 hours, and an MRI scan may be indicated.  2. Suggestion of asymmetrically small left cerebral hemisphere that is likely long-standing given that there is associated expansion of the diploic space of the left calvarium, particularly the parietal and occipital calvarium.  3. Asymmetric expansion of extra-axial CSF spaces along the left frontal and parietal convexity could be due to enlarged subarachnoid spaces or potentially subdural hygroma. This exerts no substantial mass effect.  4. Generalized cerebral volume loss is advanced for patient's age.  5. Near complete opacification of right mastoid air cells with trabecular thickening and sclerosis suggesting at least a component of chronic inflammation. There are fluid levels in scattered left mastoid air cells without coalescence. While nonspecific, this should be correlated for signs/symptoms of otomastoiditis.  CT Head 01/22/2018:  .  Brain: No evidence of acute large vascular territory infarct.  No mass effect, mass lesion, or hydrocephalus. No acute hemorrhage. Subtle areas of linear cortical hyperdensity in the left temporal lobe are favored to the secondary to areas of cortical necrosis associated with parenchymal volume loss.  CT of the head 05/21/2010:  INTERPRETATION: Moderate atrophy for age. Negative for mass or mass effect. Negative for  extra-axial fluid collection. Negative for evidence of hemorrhage.  Changes of ethmoid sinus disease are identified bilaterally.   CONCLUSION: Moderate atrophy for age. Changes of ethmoid sinus disease.  CT Head 09/2009: CONCLUSION: Asymmetric left supratentorial parenchymal atrophy versus a left convexity hygroma. Given the  patient's age and presentation, recommend MRI of the  brain to further characterize.    Review of Systems: Patient complains of symptoms per HPI as well as the following symptoms: weakness, seizures,numbness. Pertinent negatives and positives per HPI. All others negative.   Social History   Socioeconomic History  . Marital status: Single    Spouse name: Not on file  . Number of children: 0  . Years of education: Not on file  . Highest education level: High school graduate  Occupational History  . Not on file  Tobacco Use  . Smoking status: Current Some Day Smoker    Packs/day: 0.25    Types: Cigarettes  . Smokeless tobacco: Never Used  Substance and Sexual Activity  . Alcohol use: Not Currently  . Drug use: Never  . Sexual activity: Not on file  Other Topics Concern  . Not on file  Social History Narrative   L[facility] at [facility]   Right handed   Caffeine: "none hardly"   Social Determinants of Health   Financial Resource Strain:   . Difficulty of Paying Living Expenses:   Food Insecurity:   . Worried About Charity fundraiser in the Last Year:   . Arboriculturist in the Last Year:   Transportation Needs:   . Film/video editor (Medical):   Marland Kitchen Lack of Transportation (Non-Medical):   Physical Activity:   . Days of Exercise per Week:   . Minutes of Exercise per Session:   Stress:   . Feeling of Stress :   Social Connections:   . Frequency of Communication with Friends and Family:   . Frequency of Social Gatherings with Friends and Family:   . Attends Religious Services:   . Active Member of Clubs or Organizations:   . Attends Archivist Meetings:   Marland Kitchen Marital Status:   Intimate Partner Violence:   . Fear of Current or Ex-Partner:   . Emotionally Abused:   Marland Kitchen Physically Abused:   . Sexually Abused:     Family History  Problem Relation Age of Onset  . Seizures Neg Hx     Past Medical History:  Diagnosis Date  . Anxiety disorder, unspecified   . Cirrhosis ([place]) 04/2018  . Diabetes  mellitus without complication ([place])    type II  . Enterocolitis due to Clostridium difficile, not specified as recurrent 08/09/2018   admission  . Epilepsy, unspecified, not intractable, without status epilepticus ([place])   . ESRD (end stage renal disease) ([place])    MWF   . Gout, unspecified   . Heart failure, unspecified ([place])   . Hypertension   . Klebsiella pneumoniae (k. pneumoniae) as the cause of diseases classified elsewhere 06/25/2018   admission  . Liver failure ([place])   . Major depressive disorder, single episode, unspecified   . Metabolic acidosis 62/3762  . Pneumonia   . Polyneuropathy, unspecified   . Tobacco use   . Vitamin D deficiency     Patient Active Problem List   Diagnosis Date Noted  . Retroperitoneal abscess ([place]) 03/13/2019  . Infection due to ESBL-producing Klebsiella pneumoniae 03/13/2019  . Bacteremia due to Klebsiella pneumoniae 06/19/2018  . Fever 06/18/2018  . ESRD (end stage renal disease) on dialysis ([place]) 06/18/2018  . Anemia due to chronic kidney disease 06/18/2018  . Leukocytosis 06/18/2018  . Acute metabolic encephalopathy 83/15/1761  . Seizures ([place]) 05/24/2018  . BPH (benign prostatic hyperplasia) 05/24/2018  . Sepsis ([place]) 05/24/2018  . Benign  essential HTN 05/24/2018  . HLD (hyperlipidemia) 05/24/2018  . Diabetes mellitus type 2, uncontrolled, with complications ([place]) 69/48/5462  . Thrombocytopenia ([place]) 05/24/2018  . Confusion   . Metabolic acidosis 70/35/0093  . Respiratory failure ([place])   . Acute renal failure superimposed on stage 4 chronic kidney disease ([place])   . End stage liver disease ([place])   . Pressure injury of skin 04/21/2018  . Altered mental status, unspecified 04/21/2018  . HCAP (healthcare-associated pneumonia) 04/21/2018  . Acute renal failure (ARF) ([place]) 04/07/2018    Past Surgical History:  Procedure Laterality Date  . APPENDECTOMY  1981  . AV FISTULA PLACEMENT Right 10/23/2018   Procedure: ARTERIOVENOUS (AV)  FISTULA CREATION RIGHT ARM;  Surgeon: [name], MD;  Location: [facility];  Service: Vascular;  Laterality: Right;  . CHOLECYSTECTOMY    . HERNIA REPAIR  2007  . IR FLUORO GUIDE CV LINE RIGHT  05/30/2018  . IR FLUORO GUIDE CV LINE RIGHT  03/18/2019  . IR FLUORO GUIDE CV LINE RIGHT  03/22/2019  . IR REMOVAL TUN CV CATH W/O FL  03/15/2019  . IR SINUS/FIST TUBE CHK-NON GI  04/04/2019  . IR US GUIDE VASC ACCESS RIGHT  05/30/2018  . IR US GUIDE VASC ACCESS RIGHT  03/18/2019  . IR US GUIDE VASC ACCESS RIGHT  03/22/2019  . NEPHRECTOMY Left 2020    Current Outpatient Medications  Medication Sig Dispense Refill  . acetaminophen (TYLENOL) 325 MG tablet Take 650 mg by mouth every 8 (eight) hours as needed for fever (pain).    Marland Kitchen allopurinol (ZYLOPRIM) 100 MG tablet Take 100 mg by mouth daily.     . carbamazepine (CARBATROL) 100 MG 12 hr capsule Take 100 mg by mouth 2 (two) times daily.    . carbamazepine (TEGRETOL XR) 400 MG 12 hr tablet Take 800 mg by mouth 2 (two) times daily.    . cloNIDine (CATAPRES) 0.1 MG tablet Take 0.1 mg by mouth 2 (two) times daily as needed (SBP ABOVE 170).     . DULoxetine (CYMBALTA) 20 MG capsule Take 20 mg by mouth daily.    Marland Kitchen guaiFENesin (MUCINEX) 600 MG 12 hr tablet Take 600 mg by mouth every 12 (twelve) hours as needed for cough (congestion).    Marland Kitchen HYDROcodone-acetaminophen (NORCO) 5-325 MG tablet Take 1 tablet by mouth every 6 (six) hours as needed for moderate pain. 30 tablet 0  . insulin aspart (NOVOLOG) 100 UNIT/ML injection Inject 0-15 Units into the skin 4 (four) times daily -  with meals and at bedtime. 70-120=0 units, 121-150=2 units, 151-200=3 units, 201-250=5 units, 251-300=8 units, 301-350=11 units, 351-400=15 units, >400 call MD    . Insulin Glargine (BASAGLAR KWIKPEN) 100 UNIT/ML SOPN Inject 0.28 mLs (28 Units total) into the skin at bedtime. (Patient taking differently: Inject 26 Units into the skin at bedtime. ) 8.4 mL 0  . Ipratropium-Albuterol (COMBIVENT)  20-100 MCG/ACT AERS respimat Inhale 1 puff into the lungs every 6 (six) hours as needed for wheezing.    Marland Kitchen ketoconazole (NIZORAL) 2 % shampoo APPLY TO SCALP (SHAMPOO) TOPICALLY EVERY DAY SHIFT EVERY TUE, FRI, SUN FOR DRY SCALP (BATH DAYS) APPLY TO SCALP 3X A WEEK & RINSE.    Marland Kitchen lactulose (CHRONULAC) 10 GM/15ML solution Take 15 mLs (10 g total) by mouth 2 (two) times daily. (Patient taking differently: Take 30 g by mouth 2 (two) times daily. ) 236 mL 0  . lidocaine-prilocaine (EMLA) cream Apply 1 application topically every Monday, Wednesday, and Friday.    Marland Kitchen  Multiple Vitamin (MULTIVITAMIN WITH MINERALS) TABS tablet Take 1 tablet by mouth at bedtime.    Marland Kitchen omeprazole (PRILOSEC) 20 MG capsule Take 20 mg by mouth daily.     . ondansetron (ZOFRAN) 4 MG tablet Take 4 mg by mouth every 8 (eight) hours as needed for nausea or vomiting.     Marland Kitchen PHENobarbital (LUMINAL) 64.8 MG tablet Take 4 tablets (259.2 mg total) by mouth at bedtime. 120 tablet 0  . polyethylene glycol (MIRALAX / GLYCOLAX) 17 g packet Take 17 g by mouth daily as needed. 14 each 0  . Sennosides (SENNA LAX PO) Take 1 tablet by mouth at bedtime. FOR CONSTIPATION.    . sevelamer (RENAGEL) 800 MG tablet Take 1,600 mg by mouth 3 (three) times daily with meals. FOR CKD.    . simvastatin (ZOCOR) 20 MG tablet Take 20 mg by mouth at bedtime.   3  . sodium chloride flush 0.9 % SOLN injection Inject 10 mLs into the vein. ONE TIME A DAY FOR SASH/SAS TECHNIQUE PRIOR TO MED ADMINISTRATION.    . tamsulosin (FLOMAX) 0.4 MG CAPS capsule Take 0.4 mg by mouth at bedtime.    Marland Kitchen UNABLE TO FIND Med Name: HEPARIN LOCK FLUSH SOLUTION 10 UNIT/ML (HEPARIN LOCK FLUSH) Use 5 mL intravenously one time a day for SASH technique after administration of saline.     No current facility-administered medications for this visit.    Allergies as of 04/18/2019 - Review Complete 04/18/2019  Allergen Reaction Noted  . Fish oil Swelling and Other (See Comments) 04/24/2013     Vitals: BP 111/75 (BP Location: Left Arm, Patient Position: Sitting)   Pulse 84   Temp 98 F (36.7 C) Comment: taken at front  Ht 6' 2"  (1.88 m)   Wt 197 lb 15.6 oz (89.8 kg)   BMI 25.42 kg/m  Last Weight:  Wt Readings from Last 1 Encounters:  04/18/19 197 lb 15.6 oz (89.8 kg)   Last Height:   Ht Readings from Last 1 Encounters:  04/18/19 6' 2"  (1.88 m)   Physical exam: Exam: Gen: NAD, conversant, poor dentition and poorly groomed,psychomotor slowing              CV: RRR, no MRG. No Carotid Bruits. No peripheral edema, warm, nontender Eyes: Conjunctivae clear without exudates or hemorrhage  Neuro: Detailed Neurologic Exam  Speech:    Speech is normal; fluent and spontaneous with normal comprehension.  Cognition:    The patient is oriented to person, place, and time;     recent and remote memory impaired;     language fluent;     normal attention, concentration.    Impaired fund of knowledge. Cranial Nerves: Left pupil 54m and responsive. Right is 231mand sluggish. Attempted fundoscopy could not visualize. Visual fields are full to finger confrontation. Extraocular movements are intact. Trigeminal sensation is intact and the muscles of mastication are normal. The face is symmetric. The palate elevates in the midline. Hearing intact. Voice is normal. Shoulder shrug is normal. The tongue has normal motion without fasciculations.   Coordination:    No dysmetria or ataxia noted, difficulty with FTN right due to weakness  Gait:    Cannot stand unattended, wheelchair  Motor Observation:    no involuntary movements noted. Tone:    Normal muscle tone.    Posture:    Posture is normal.     Strength:  Mild weakness right arm and right leg more proximally 3+/5 prox and 4-4+distally. left hand weakness  of ulnar digit 5.           Sensation: intact to LT     Reflex Exam:  DTR's:    Deep tendon reflexes in the upper and lower extremities are hyporeflexic bilaterally;  absent lowers, trace biceps.   Toes:    The toes are equivocal bilaterally.   Clonus:    Clonus is absent.    Assessment/Plan 49 y.o. male here as requested by [name] MD for seizures. PMHx ESRD on HD, ESBL nephrectomy, type 2 diabetes, hypertension, heart failure, urea cycle metabolism disorder, noncompliance, obstructive sleep apnea, epilepsy, morbid severe obesity with hypoventilation, muscle wasting and atrophy, polyneuropathy, abnormalities of gait and mobility, hyperlipidemia, depression, anxiety, gout, tobacco use with recent admission for retroperitoneal abscess.   -Patient is a poor historian, he reports seizures since a child and has been on current seizure medication for years. I cannot find any history for his seizures. At this time I would continue current dose of AEDs, last levels were slightly low however patient is stable so will not adjust any medications at this time.  -Patient's AEDmedications can be prescribed by his primary care, he can return to neurology clinic as needed.  - Long term use of phenobarb can cause osteoporosis, recommend follow up with primary care for surveillance and treatment as appropriate    Cc: Verl Blalock, NP,  Practice, [place], [place]  [facility] [address] [place], [place] [address]  Phone [phone] Fax [phone]  I spent 60 minutes of face-to-face and non-face-to-face time with patient on the  1. Nonintractable epilepsy without status epilepticus, unspecified epilepsy type ([place])    diagnosis.  This included previsit chart review, lab review, study review, order entry, electronic health record documentation, patient education on the different diagnostic and therapeutic options, counseling and coordination of care, risks and benefits of management, compliance, or risk factor reduction

## 2019-04-18 ENCOUNTER — Ambulatory Visit (INDEPENDENT_AMBULATORY_CARE_PROVIDER_SITE_OTHER): Payer: Medicare Other | Admitting: Neurology

## 2019-04-18 ENCOUNTER — Other Ambulatory Visit: Payer: Self-pay

## 2019-04-18 ENCOUNTER — Encounter: Payer: Self-pay | Admitting: Neurology

## 2019-04-18 VITALS — BP 111/75 | HR 84 | Temp 98.0°F | Ht 74.0 in | Wt 198.0 lb

## 2019-04-18 DIAGNOSIS — G40909 Epilepsy, unspecified, not intractable, without status epilepticus: Secondary | ICD-10-CM

## 2019-04-18 NOTE — Patient Instructions (Signed)
Continue current doses of tegretol/carbatrol and phenobarbitol

## 2019-08-26 ENCOUNTER — Emergency Department (HOSPITAL_COMMUNITY): Payer: Medicare Other

## 2019-08-26 ENCOUNTER — Other Ambulatory Visit: Payer: Self-pay

## 2019-08-26 ENCOUNTER — Emergency Department (HOSPITAL_COMMUNITY)
Admission: EM | Admit: 2019-08-26 | Discharge: 2019-08-26 | Disposition: A | Discharge status: Home / Self Care | Payer: Medicare Other | Attending: Emergency Medicine | Admitting: Emergency Medicine

## 2019-08-26 ENCOUNTER — Encounter (HOSPITAL_COMMUNITY): Payer: Self-pay | Admitting: Emergency Medicine

## 2019-08-26 DIAGNOSIS — N186 End stage renal disease: Secondary | ICD-10-CM | POA: Diagnosis not present

## 2019-08-26 DIAGNOSIS — I509 Heart failure, unspecified: Secondary | ICD-10-CM | POA: Diagnosis not present

## 2019-08-26 DIAGNOSIS — Z20822 Contact with and (suspected) exposure to covid-19: Secondary | ICD-10-CM | POA: Insufficient documentation

## 2019-08-26 DIAGNOSIS — I132 Hypertensive heart and chronic kidney disease with heart failure and with stage 5 chronic kidney disease, or end stage renal disease: Secondary | ICD-10-CM | POA: Insufficient documentation

## 2019-08-26 DIAGNOSIS — Z794 Long term (current) use of insulin: Secondary | ICD-10-CM | POA: Diagnosis not present

## 2019-08-26 DIAGNOSIS — F17210 Nicotine dependence, cigarettes, uncomplicated: Secondary | ICD-10-CM | POA: Diagnosis not present

## 2019-08-26 DIAGNOSIS — E1122 Type 2 diabetes mellitus with diabetic chronic kidney disease: Secondary | ICD-10-CM | POA: Diagnosis not present

## 2019-08-26 DIAGNOSIS — R079 Chest pain, unspecified: Secondary | ICD-10-CM | POA: Diagnosis present

## 2019-08-26 DIAGNOSIS — Z79899 Other long term (current) drug therapy: Secondary | ICD-10-CM | POA: Diagnosis not present

## 2019-08-26 DIAGNOSIS — R1084 Generalized abdominal pain: Secondary | ICD-10-CM | POA: Diagnosis not present

## 2019-08-26 DIAGNOSIS — Z992 Dependence on renal dialysis: Secondary | ICD-10-CM | POA: Insufficient documentation

## 2019-08-26 LAB — BASIC METABOLIC PANEL
Anion gap: 15 (ref 5–15)
BUN: 73 mg/dL — ABNORMAL HIGH (ref 6–20)
CO2: 15 mmol/L — ABNORMAL LOW (ref 22–32)
Calcium: 7.2 mg/dL — ABNORMAL LOW (ref 8.9–10.3)
Chloride: 104 mmol/L (ref 98–111)
Creatinine, Ser: 9.26 mg/dL — ABNORMAL HIGH (ref 0.61–1.24)
GFR calc Af Amer: 7 mL/min — ABNORMAL LOW (ref >60)
GFR calc non Af Amer: 6 mL/min — ABNORMAL LOW (ref >60)
Glucose, Bld: 255 mg/dL — ABNORMAL HIGH (ref 70–99)
Potassium: 4.7 mmol/L (ref 3.5–5.1)
Sodium: 134 mmol/L — ABNORMAL LOW (ref 135–145)

## 2019-08-26 LAB — CBC
HCT: 32.7 % — ABNORMAL LOW (ref 39.0–52.0)
Hemoglobin: 10.6 g/dL — ABNORMAL LOW (ref 13.0–17.0)
MCH: 34.4 pg — ABNORMAL HIGH (ref 26.0–34.0)
MCHC: 32.4 g/dL (ref 30.0–36.0)
MCV: 106.2 fL — ABNORMAL HIGH (ref 80.0–100.0)
Platelets: 134 10*3/uL — ABNORMAL LOW (ref 150–400)
RBC: 3.08 MIL/uL — ABNORMAL LOW (ref 4.22–5.81)
RDW: 15.4 % (ref 11.5–15.5)
WBC: 5.5 10*3/uL (ref 4.0–10.5)
nRBC: 0 % (ref 0.0–0.2)

## 2019-08-26 LAB — URINALYSIS, ROUTINE W REFLEX MICROSCOPIC
Bilirubin Urine: NEGATIVE
Glucose, UA: >=500 mg/dL — AB
Hgb urine dipstick: NEGATIVE
Ketones, ur: NEGATIVE mg/dL
Nitrite: NEGATIVE
Protein, ur: 30 mg/dL — AB
Specific Gravity, Urine: 1.009 (ref 1.005–1.030)
pH: 6 (ref 5.0–8.0)

## 2019-08-26 LAB — HEPATIC FUNCTION PANEL
ALT: 13 U/L (ref 0–44)
AST: 12 U/L — ABNORMAL LOW (ref 15–41)
Albumin: 2.9 g/dL — ABNORMAL LOW (ref 3.5–5.0)
Alkaline Phosphatase: 137 U/L — ABNORMAL HIGH (ref 38–126)
Bilirubin, Direct: <0.1 mg/dL (ref 0.0–0.2)
Total Bilirubin: 1 mg/dL (ref 0.3–1.2)
Total Protein: 6.4 g/dL — ABNORMAL LOW (ref 6.5–8.1)

## 2019-08-26 LAB — TROPONIN I (HIGH SENSITIVITY)
Troponin I (High Sensitivity): 4 ng/L (ref <18)
Troponin I (High Sensitivity): 4 ng/L (ref <18)

## 2019-08-26 LAB — LIPASE, BLOOD: Lipase: 19 U/L (ref 11–51)

## 2019-08-26 LAB — SARS CORONAVIRUS 2 BY RT PCR (HOSPITAL ORDER, PERFORMED IN ~~LOC~~ HOSPITAL LAB): SARS Coronavirus 2: NEGATIVE

## 2019-08-26 LAB — AMMONIA: Ammonia: 31 umol/L (ref 9–35)

## 2019-08-26 MED ORDER — MORPHINE SULFATE (PF) 2 MG/ML IV SOLN
2.0000 mg | Freq: Once | INTRAVENOUS | Status: AC
  Administered 2019-08-26: 2 mg via INTRAVENOUS
  Filled 2019-08-26: qty 1

## 2019-08-26 MED ORDER — SODIUM CHLORIDE 0.9% FLUSH
3.0000 mL | Freq: Once | INTRAVENOUS | Status: AC
  Administered 2019-08-26: 3 mL via INTRAVENOUS

## 2019-08-26 MED ORDER — IOHEXOL 300 MG/ML  SOLN
100.0000 mL | Freq: Once | INTRAMUSCULAR | Status: AC | PRN
  Administered 2019-08-26: 100 mL via INTRAVENOUS

## 2019-08-26 NOTE — ED Notes (Signed)
Report given to [facility]. [facility] arrived to transport patient.

## 2019-08-26 NOTE — ED Provider Notes (Signed)
49 yo male w/ ESRD on dialysis MWF (missed Friday), makes small amount urine, here for abdominal pain x  2 days.   Presenting from SNF.  Reporting nausea and diarrhea.  Also describing chest pressure.  Patient is a poor historian.  He is moaning on the stretcher with eyes closed.  His vitals were wnl here.  Lungs CTAB.  He does have diffuse abdominal tenderness (obese abdomen) with +rebound tenderness.  He has a sig hx of pyelonephritis and retroperitoneal abscess within past year and we will need repeat CT imaging here.  WBC wnl. He does report getting the covid vaccines in the past  ECG nonischemic and delta trops are flat - doubtful of ACS No hx or risk factors for AAA.  On arrival in ED no clear presentation for sepsis.  Update - CT imaging demonstrates no acute infection or abscess formation.  Likely some stool impaction.  Patient repeatedly insists he had "normal BM" yesterday.  Advised his facility to offer suppository for next 2-3 days. Labs at baseline for him. PA provider discuss with nephrology team who felt the patient was reasonable for discharge with scheduled dialysis tomorrow.   Wyvonnia Dusky, MD 08/26/19 [phone]

## 2019-08-26 NOTE — ED Triage Notes (Signed)
Patient arrives to ED with [facility] from [facility] with complaints of generalized chest pain that started yesterday morning. Yesterday the pain was intermittent and today the pain is constant. Patient denies N/V/D. Patient is a M,W,F dialysis patient and missed this past friday. Patient denies SOB and is NAD in triage.

## 2019-08-26 NOTE — ED Notes (Signed)
PTAR called @ 1608-per [place], RN called by Levada Dy

## 2019-08-26 NOTE — ED Provider Notes (Addendum)
[facility]  EMERGENCY DEPARTMENT Provider Note   CSN: [id number] Arrival date & time: 08/26/19  5631     History Chief Complaint  Patient presents with  . Chest Pain    Eddie Davies is a 49 y.o. male.  HPI 49 year old male with an extensive medical history including ESRD MWF, cirrhosis, DM type II, epilepsy, hypertension, pyelonephritis presents to the ER with complaints of generalized chest pain, abdominal pain with onset on Saturday morning.  He reports some associated nausea, last bowel movement was this morning and normal.  Patient is a poor historian.  He states that the chest pain and abdominal pain was intermittent originally, however is becoming more constant.  He is sleeping in the ER bed, is alert and oriented, but still moaning.  Denies any fevers or chills.  No active vomiting.  Patient does make a small amount of urine.  He states that he does have a history of constipation, and takes lactulose.  Spoke with nursing staff at [facility] the patient resides.  Nursing staff reports that the patient was complaining of chest pain when he was standing outside and smoking a cigarette in the middle of the night from Sunday into Monday.  They denied any diaphoresis or tachypnea.  Nursing staff reports that the patient is a night owl, stays up throughout the night, however still largely sleeps throughout the day.  She says that he will awaken take his medicines, however will then fall back to sleep.     Past Medical History:  Diagnosis Date  . Anxiety disorder, unspecified   . Cirrhosis ([place]) 04/2018  . Diabetes mellitus without complication ([place])    type II  . Enterocolitis due to Clostridium difficile, not specified as recurrent 08/09/2018   admission  . Epilepsy, unspecified, not intractable, without status epilepticus ([place])   . ESRD (end stage renal disease) ([place])    MWF   . Gout, unspecified   . Heart failure, unspecified ([place])   .  Hypertension   . Klebsiella pneumoniae (k. pneumoniae) as the cause of diseases classified elsewhere 06/25/2018   admission  . Liver failure ([place])   . Major depressive disorder, single episode, unspecified   . Metabolic acidosis 49/7026  . Pneumonia   . Polyneuropathy, unspecified   . Tobacco use   . Vitamin D deficiency     Patient Active Problem List   Diagnosis Date Noted  . Retroperitoneal abscess ([place]) 03/13/2019  . Infection due to ESBL-producing Klebsiella pneumoniae 03/13/2019  . Bacteremia due to Klebsiella pneumoniae 06/19/2018  . Fever 06/18/2018  . ESRD (end stage renal disease) on dialysis ([place]) 06/18/2018  . Anemia due to chronic kidney disease 06/18/2018  . Leukocytosis 06/18/2018  . Acute metabolic encephalopathy 37/85/8850  . Seizures ([place]) 05/24/2018  . BPH (benign prostatic hyperplasia) 05/24/2018  . Sepsis ([place]) 05/24/2018  . Benign essential HTN 05/24/2018  . HLD (hyperlipidemia) 05/24/2018  . Diabetes mellitus type 2, uncontrolled, with complications ([place]) 27/74/1287  . Thrombocytopenia ([place]) 05/24/2018  . Confusion   . Metabolic acidosis 86/76/7209  . Respiratory failure ([place])   . Acute renal failure superimposed on stage 4 chronic kidney disease ([place])   . End stage liver disease ([place])   . Pressure injury of skin 04/21/2018  . Altered mental status, unspecified 04/21/2018  . HCAP (healthcare-associated pneumonia) 04/21/2018  . Acute renal failure (ARF) ([place]) 04/07/2018    Past Surgical History:  Procedure Laterality Date  . APPENDECTOMY  1981  . AV FISTULA  PLACEMENT Right 10/23/2018   Procedure: ARTERIOVENOUS (AV) FISTULA CREATION RIGHT ARM;  Surgeon: [name], MD;  Location: [facility];  Service: Vascular;  Laterality: Right;  . CHOLECYSTECTOMY    . HERNIA REPAIR  2007  . IR FLUORO GUIDE CV LINE RIGHT  05/30/2018  . IR FLUORO GUIDE CV LINE RIGHT  03/18/2019  . IR FLUORO GUIDE CV LINE RIGHT  03/22/2019  . IR REMOVAL TUN CV CATH W/O FL  03/15/2019    . IR SINUS/FIST TUBE CHK-NON GI  04/04/2019  . IR US GUIDE VASC ACCESS RIGHT  05/30/2018  . IR US GUIDE VASC ACCESS RIGHT  03/18/2019  . IR US GUIDE VASC ACCESS RIGHT  03/22/2019  . NEPHRECTOMY Left 2020       Family History  Problem Relation Age of Onset  . Seizures Neg Hx     Social History   Tobacco Use  . Smoking status: Current Some Day Smoker    Packs/day: 0.25    Types: Cigarettes  . Smokeless tobacco: Never Used  Vaping Use  . Vaping Use: Never used  Substance Use Topics  . Alcohol use: Not Currently  . Drug use: Never    Home Medications Prior to Admission medications   Medication Sig Start Date End Date Taking? Authorizing Provider  acetaminophen (TYLENOL) 325 MG tablet Take 650 mg by mouth every 8 (eight) hours as needed for fever (pain).    [provider]  allopurinol (ZYLOPRIM) 100 MG tablet Take 100 mg by mouth daily.  08/01/18   [provider]  carbamazepine (CARBATROL) 100 MG 12 hr capsule Take 100 mg by mouth 2 (two) times daily. 02/23/19   [provider]  carbamazepine (TEGRETOL XR) 400 MG 12 hr tablet Take 800 mg by mouth 2 (two) times daily.    [provider]  cloNIDine (CATAPRES) 0.1 MG tablet Take 0.1 mg by mouth 2 (two) times daily as needed (SBP ABOVE 170).  07/25/18   [provider]  DULoxetine (CYMBALTA) 20 MG capsule Take 20 mg by mouth daily.    [provider]  guaiFENesin (MUCINEX) 600 MG 12 hr tablet Take 600 mg by mouth every 12 (twelve) hours as needed for cough (congestion).    [provider]  HYDROcodone-acetaminophen (NORCO) 5-325 MG tablet Take 1 tablet by mouth every 6 (six) hours as needed for moderate pain. 12/05/18   [place] Eric J, DO  insulin aspart (NOVOLOG) 100 UNIT/ML injection Inject 0-15 Units into the skin 4 (four) times daily -  with meals and at bedtime. 70-120=0 units, 121-150=2 units, 151-200=3 units, 201-250=5 units, 251-300=8 units, 301-350=11 units, 351-400=15  units, >400 call MD    [provider]  Insulin Glargine (BASAGLAR KWIKPEN) 100 UNIT/ML SOPN Inject 0.28 mLs (28 Units total) into the skin at bedtime. Patient taking differently: Inject 26 Units into the skin at bedtime.  12/05/18 04/18/19  [place] Eric J, DO  Ipratropium-Albuterol (COMBIVENT) 20-100 MCG/ACT AERS respimat Inhale 1 puff into the lungs every 6 (six) hours as needed for wheezing.    [provider]  ketoconazole (NIZORAL) 2 % shampoo APPLY TO SCALP (SHAMPOO) TOPICALLY EVERY DAY SHIFT EVERY TUE, FRI, SUN FOR DRY SCALP (BATH DAYS) APPLY TO SCALP 3X A WEEK & RINSE. 04/11/19   [provider]  lactulose (CHRONULAC) 10 GM/15ML solution Take 15 mLs (10 g total) by mouth 2 (two) times daily. Patient taking differently: Take 30 g by mouth 2 (two) times daily.  05/31/18   [name],  [name] K, MD  lidocaine-prilocaine (EMLA) cream Apply 1 application topically every Monday, Wednesday, and Friday.    [provider]  Multiple Vitamin (MULTIVITAMIN WITH MINERALS) TABS tablet Take 1 tablet by mouth at bedtime.    [provider]  omeprazole (PRILOSEC) 20 MG capsule Take 20 mg by mouth daily.  07/04/18   [provider]  ondansetron (ZOFRAN) 4 MG tablet Take 4 mg by mouth every 8 (eight) hours as needed for nausea or vomiting.  08/03/18   [provider]  PHENobarbital (LUMINAL) 64.8 MG tablet Take 4 tablets (259.2 mg total) by mouth at bedtime. 12/05/18 04/18/19  [place] Donnamarie Poag, DO  polyethylene glycol (MIRALAX / GLYCOLAX) 17 g packet Take 17 g by mouth daily as needed. 03/23/19   Sheikh, Omair Latif, DO  Sennosides (SENNA LAX PO) Take 1 tablet by mouth at bedtime. FOR CONSTIPATION.    [provider]  sevelamer (RENAGEL) 800 MG tablet Take 1,600 mg by mouth 3 (three) times daily with meals. FOR CKD.    [provider]  simvastatin (ZOCOR) 20 MG tablet Take 20 mg by mouth at bedtime.     [provider]  sodium chloride  flush 0.9 % SOLN injection Inject 10 mLs into the vein. ONE TIME A DAY FOR SASH/SAS TECHNIQUE PRIOR TO MED ADMINISTRATION.    [provider]  tamsulosin (FLOMAX) 0.4 MG CAPS capsule Take 0.4 mg by mouth at bedtime.    [provider]  UNABLE TO FIND Med Name: HEPARIN LOCK FLUSH SOLUTION 10 UNIT/ML (HEPARIN LOCK FLUSH) Use 5 mL intravenously one time a day for SASH technique after administration of saline.    [provider]    Allergies    Fish oil  Review of Systems   Review of Systems  Constitutional: Negative for chills and fever.  HENT: Negative for ear pain and sore throat.   Eyes: Negative for pain and visual disturbance.  Respiratory: Negative for cough and shortness of breath.   Cardiovascular: Positive for chest pain. Negative for palpitations.  Gastrointestinal: Positive for abdominal pain. Negative for vomiting.  Genitourinary: Negative for dysuria and hematuria.  Musculoskeletal: Negative for arthralgias and back pain.  Skin: Negative for color change and rash.  Neurological: Negative for seizures and syncope.  All other systems reviewed and are negative.   Physical Exam Updated Vital Signs BP 110/70 (BP Location: Right Arm)   Pulse 71   Temp 98.2 F (36.8 C) (Oral)   Resp 16   Ht 6' 2"  (1.88 m)   Wt (!) 96.4 kg   SpO2 99%   BMI 27.29 kg/m   Physical Exam Vitals and nursing note reviewed.  Constitutional:      General: He is not in acute distress.    Appearance: He is well-developed. He is obese. He is ill-appearing (chronically ill appearing ). He is not toxic-appearing or diaphoretic.     Comments: Pt sleeping initially on entering the room, alert and oriented  X 3, however moaning when not speaking  HENT:     Head: Normocephalic and atraumatic.  Eyes:     Conjunctiva/sclera: Conjunctivae normal.     Pupils: Pupils are equal, round, and reactive to light.  Cardiovascular:     Rate and Rhythm: Normal rate and regular rhythm.      Pulses:          Radial pulses are 2+ on the right side and 2+ on the left side.     Heart sounds:  Normal heart sounds. No murmur heard.   Pulmonary:     Effort: Pulmonary effort is normal. No respiratory distress.     Breath sounds: Normal breath sounds.  Chest:     Chest wall: No tenderness.  Abdominal:     Palpations: Abdomen is soft.     Tenderness: There is abdominal tenderness. There is rebound.     Comments: Rebound, distended abdomen  Musculoskeletal:     Cervical back: Neck supple.     Right lower leg: No tenderness. No edema.     Left lower leg: No tenderness. No edema.  Skin:    General: Skin is warm and dry.     Capillary Refill: Capillary refill takes less than 2 seconds.     Comments: Dry scaly skin on arms and legs.  Neurological:     General: No focal deficit present.     Mental Status: He is alert and oriented to person, place, and time.     Cranial Nerves: No cranial nerve deficit.     ED Results / Procedures / Treatments   Labs (all labs ordered are listed, but only abnormal results are displayed) Labs Reviewed  BASIC METABOLIC PANEL - Abnormal; Notable for the following components:      Result Value   Sodium 134 (*)    CO2 15 (*)    Glucose, Bld 255 (*)    BUN 73 (*)    Creatinine, Ser 9.26 (*)    Calcium 7.2 (*)    GFR calc non Af Amer 6 (*)    GFR calc Af Amer 7 (*)    All other components within normal limits  CBC - Abnormal; Notable for the following components:   RBC 3.08 (*)    Hemoglobin 10.6 (*)    HCT 32.7 (*)    MCV 106.2 (*)    MCH 34.4 (*)    Platelets 134 (*)    All other components within normal limits  HEPATIC FUNCTION PANEL - Abnormal; Notable for the following components:   Total Protein 6.4 (*)    Albumin 2.9 (*)    AST 12 (*)    Alkaline Phosphatase 137 (*)    All other components within normal limits  SARS CORONAVIRUS 2 BY RT PCR (HOSPITAL ORDER, [facility])  LIPASE, BLOOD  URINALYSIS,  ROUTINE W REFLEX MICROSCOPIC  AMMONIA  TROPONIN I (HIGH SENSITIVITY)  TROPONIN I (HIGH SENSITIVITY)    EKG EKG Interpretation  Date/Time:  Monday August 26 2019 07:08:11 EDT Ventricular Rate:  72 PR Interval:  206 QRS Duration: 118 QT Interval:  416 QTC Calculation: 455 R Axis:   -39 Text Interpretation: Normal sinus rhythm Left axis deviation Low voltage QRS Cannot rule out Anterior infarct , age undetermined Abnormal ECG No STEMI Confirmed by Octaviano Glow [phone]) on 08/26/2019 10:46:43 AM   Radiology DG Chest 2 View  Result Date: 08/26/2019 CLINICAL DATA:  Chest pain EXAM: CHEST - 2 VIEW COMPARISON:  March 12, 2019 FINDINGS: RIGHT-sided dialysis catheter no longer in place. Film rotated to the LEFT. Cardiomediastinal contours and hilar structures are normal. Scarring or atelectasis seen at the LEFT lung base. No signs of effusion. Visualized skeletal structures on limited assessment show no acute process. IMPRESSION: Scarring or atelectasis at the LEFT lung base. No acute cardiopulmonary disease. Electronically Signed   By: Zetta Bills M.D.   On: 08/26/2019 08:08   CT ABDOMEN PELVIS W CONTRAST  Result Date: 08/26/2019 CLINICAL DATA:  49 year old  male with chest pain, abdominal distension. EXAM: CT ABDOMEN AND PELVIS WITH CONTRAST TECHNIQUE: Multidetector CT imaging of the abdomen and pelvis was performed using the standard protocol following bolus administration of intravenous contrast. CONTRAST:  120m OMNIPAQUE IOHEXOL 300 MG/ML  SOLN COMPARISON:  CT Abdomen and Pelvis 04/04/2019. FINDINGS: Lower chest: Chronic volume loss at the left lung base with atelectasis and/or scarring. Cardiac size at the upper limits of normal. Calcified coronary artery atherosclerosis is evident on the right (series 5, image 5). No pericardial or pleural effusion. Hepatobiliary: Absent gallbladder as before.  Negative liver. Pancreas: Chronic pancreatic atrophy and calcific pancreatitis with a chronic  but mildly larger now nearly 3 cm cystic lesion of the pancreatic tail (series 3, image 30). No acute peripancreatic inflammation. Spleen: Stable mild splenomegaly. Adrenals/Urinary Tract: Adrenal glands remain within normal limits. Absent left kidney. Resolved left renal space abscess/collection since March. Bulky chronic nephrolithiasis at the right renal pelvis and ureteropelvic junction measuring up to 17 mm. Continued perinephric and collecting system inflammation of the right kidney, and on delayed images there is little contrast excretion although there is no overt hydro nephrosis. Additional right nephrolithiasis. The right ureter is fairly decompressed. Diminutive and unremarkable bladder. Stomach/Bowel: Similar circumferential wall thickening in the rectosigmoid colon with superimposed retained stool, although the stool ball size is decreased. Redundant sigmoid colon tracking into the right lower quadrant. Mild retained stool upstream of the distal sigmoid. No other large bowel inflammation. Diminutive or absent appendix. No dilated small bowel. Terminal ileum within normal limits. Decompressed stomach. No free air, free fluid. Vascular/Lymphatic: Major arterial structures are patent. Mild atherosclerosis, primarily in the iliac vessels. Portal venous system appears patent. No lymphadenopathy. Reproductive: Negative. Other: Presacral soft tissue stranding similar to the March exam. No free fluid. Musculoskeletal: New since March but subacute appearing but ununited left lateral 8th rib fracture (series 5, image 32). The visible spine and pelvis appears stable. Chronic left femur ORIF. IMPRESSION: 1. New since March but subacute appearing left lateral 8th rib fracture. 2. 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 Chronic right nephrolithiasis including bulky calculus of ureteropelvic junction with chronic pararenal inflammation. But no acute hydronephrosis. 3.  Chronic calcific pancreatitis. A 3 cm cystic lesion of the pancreatic tail has mildly increased since January, although pseudocyst is favored over cystic neoplasm. As described on the March CT recommend follow-up MRI/MRCP in 2022 (please see that report). 4. Chronic fecal impaction suspected, minimally improved since March. 5. Calcified coronary artery atherosclerosis. Electronically Signed   By: HGenevie AnnM.D.   On: 08/26/2019 13:26    Procedures Procedures (including critical care time)  Medications Ordered in ED Medications  sodium chloride flush (NS) 0.9 % injection 3 mL (3 mLs Intravenous Given 08/26/19 1344)  morphine 2 MG/ML injection 2 mg (2 mg Intravenous Given 08/26/19 1145)  iohexol (OMNIPAQUE) 300 MG/ML solution 100 mL (100 mLs Intravenous Contrast Given 08/26/19 1253)    ED Course  I have reviewed the triage vital signs and the nursing notes.  Pertinent labs & imaging results that were available during my care of the patient were reviewed by me and considered in my medical decision making (see chart for details).    MDM Rules/Calculators/A&P                         49year old male with chest pain and abdominal pain which started this morning On presentation, the patient is sleeping in  the ER bed, awakens and is able to answer questions, oriented x3, however is moaning when not speaking.  He will quickly fall back asleep if not directly addressed.   VITALS: WNL  PE:  rebound generalized tenderness to the abdomen on Dr. Jones Broom exam, grossly neurologically intact.  Moving all 4 extremities without difficulty.  Lung sounds clear, with equal chest rise.  DDX: Wide DDx, including ACS, PE, pneumonia, diverticulitis, pyelonephritis, retroperitoneal abscess, appendicitis, SBO/LBO, dissection  LABS:  CBC without leukocytosis, globin of 10.6 which is actually improved from previous values.  Platelets of 134 which also appears to be stable. BMP with mild hyponatremia, glucose of 255,  BUN/creatinine consistent with ESRD   Delta troponin negative.  Lipase normal. Normal ammonia.  EKG without significant changes from prior  IMAGING:  Chest x-ray with scarring/atelectasis in the left lung base, however no other signs of acute processes Given abdominal tenderness on Dr.Trifan's exam, ordered CT of the abdomen which showed no significant findings other than evidence of chronic impaction.  MDM: Without evidence of intra-abdominal pathology other than chronic impaction.  Patient states that he had a normal bowel movement this morning.  Likely would benefit from a Dulcolax enema, which I relayed to his nursing facility.  Cardiac work-up reassuring.  No evidence of ACS.  Doubt PE as the patient is not hypoxic, tachycardic or tachypneic. Patient is afebrile, no flank pain, no CT evidence of pyelonephritis.  Unable to get urine sample given patient does not make urine.  Per discussion with the SNF, the patient normally stays up all night, and sleeps throughout the day.  They state that he generally will awaken during the day if prompted, will answer questions, but will then will quickly fall back asleep.  Patient presenting with similar presentation here.  I do not think he needs an altered mental status work-up as this appears to be his baseline.  Covid negative.  Suspicion for AAA lateral ACS no symptoms or risk factors.  CONSULTS: Nephrology PA [facility] who relates that in-house dialysis is booked for today, and he does not need emergent dialysis in the hospital.  They will arrange for outpatient dialysis tomorrow, and have contacted his SNF.   DISPOSITION: Patient stable for discharge back to SNF, with pending outpatient dialysis tomorrow and strict return precautions.  All the patient's questions have been answered to his satisfaction, he voices understanding and is agreeable to this plan.  At this stage in the ED course, the patient is medically screened and stable for  discharge.  Patient was seen and evaluated by Dr. Langston Masker who is agreeable to the above plan and disposition. Final Clinical Impression(s) / ED Diagnoses Final diagnoses:  Chest pain, unspecified type  Generalized abdominal pain    Rx / DC Orders ED Discharge Orders    None          Garald Balding, [name] 08/26/19 1624    Wyvonnia Dusky, MD 08/26/19 1756

## 2019-08-26 NOTE — ED Notes (Signed)
Attempted to call report multiple times to [facility]. Unsuccessful reaching nurse that takes care of patient

## 2019-08-26 NOTE — Discharge Instructions (Addendum)
Your work-up today was overall reassuring.  You likely benefit from a Dulcolax enema clear out your chronic impaction.  You are set up with outpatient dialysis tomorrow.  Return to the ER if your symptoms worsen.  Please follow-up with your primary care doctor about the symptoms you are experiencing today.

## 2019-08-26 NOTE — ED Notes (Signed)
Pt reports he makes very little urine, unable to provide sample.

## 2019-12-31 ENCOUNTER — Other Ambulatory Visit: Payer: Self-pay

## 2019-12-31 ENCOUNTER — Encounter: Payer: Self-pay | Admitting: Sports Medicine

## 2019-12-31 ENCOUNTER — Ambulatory Visit (INDEPENDENT_AMBULATORY_CARE_PROVIDER_SITE_OTHER): Payer: Medicare Other | Admitting: Sports Medicine

## 2019-12-31 DIAGNOSIS — B351 Tinea unguium: Secondary | ICD-10-CM | POA: Diagnosis not present

## 2019-12-31 DIAGNOSIS — I739 Peripheral vascular disease, unspecified: Secondary | ICD-10-CM | POA: Diagnosis not present

## 2019-12-31 DIAGNOSIS — M79674 Pain in right toe(s): Secondary | ICD-10-CM | POA: Diagnosis not present

## 2019-12-31 DIAGNOSIS — M79671 Pain in right foot: Secondary | ICD-10-CM | POA: Diagnosis not present

## 2019-12-31 DIAGNOSIS — N186 End stage renal disease: Secondary | ICD-10-CM | POA: Diagnosis not present

## 2019-12-31 DIAGNOSIS — L853 Xerosis cutis: Secondary | ICD-10-CM

## 2019-12-31 DIAGNOSIS — Z992 Dependence on renal dialysis: Secondary | ICD-10-CM

## 2019-12-31 DIAGNOSIS — M79675 Pain in left toe(s): Secondary | ICD-10-CM

## 2019-12-31 DIAGNOSIS — E1142 Type 2 diabetes mellitus with diabetic polyneuropathy: Secondary | ICD-10-CM | POA: Diagnosis not present

## 2019-12-31 DIAGNOSIS — M79672 Pain in left foot: Secondary | ICD-10-CM

## 2019-12-31 NOTE — Progress Notes (Signed)
Subjective: Eddie Davies is a 49 y.o. male patient with history of diabetes who presents to office today complaining of long,mildly painful nails; unable to trim. Patient states that the glucose reading this morning was not recorded but after lunch was 452 mg/dl as recorded by [facility] and saw PCP Dr. Junius Argyle and NP today. Patient denies any new changes in medication or new problems. Patient admits chronic numbness, burning or tingling in the legs and dry skin. Reports family history of this skin condition.   Review of Systems  All other systems reviewed and are negative.    Patient Active Problem List   Diagnosis Date Noted  . Fluid overload, unspecified 04/11/2019  . Allergy, unspecified, initial encounter 04/06/2019  . Retroperitoneal abscess ([place]) 03/13/2019  . Infection due to ESBL-producing Klebsiella pneumoniae 03/13/2019  . Personal history of COVID-19 02/25/2019  . Postprocedural hematoma of a genitourinary system organ or structure following a genitourinary system procedure 02/14/2019  . Coagulopathy ([place]) 02/06/2019  . Failure to thrive in adult 01/21/2019  . 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 Protein-calorie malnutrition, severe ([place]) 01/21/2019  . COVID-19 virus infection 01/09/2019  . History of ESBL Klebsiella pneumoniae infection 01/09/2019  . Septic shock ([place]) 01/09/2019  . ESRD on hemodialysis ([place]) 12/27/2018  . Gram negative sepsis ([place]) 12/27/2018  . History of Clostridioides difficile colitis 12/27/2018  . Encounter for immunization 09/12/2018  . Secondary hyperparathyroidism of renal origin ([name]) 08/28/2018  . Pain, unspecified 07/09/2018  . Bacteremia due to Klebsiella pneumoniae 06/19/2018  . Fever 06/18/2018  . ESRD (end stage renal disease) on dialysis ([place]) 06/18/2018  . Anemia due to chronic kidney disease 06/18/2018  . Leukocytosis 06/18/2018  . Diarrhea, unspecified 06/08/2018  . Dyspnea, unspecified 06/01/2018  .  Other cirrhosis of liver ([place]) 06/01/2018  . Acute metabolic encephalopathy 27/03/5007  . Seizures ([place]) 05/24/2018  . BPH (benign prostatic hyperplasia) 05/24/2018  . Sepsis ([place]) 05/24/2018  . Benign essential HTN 05/24/2018  . HLD (hyperlipidemia) 05/24/2018  . Diabetes mellitus type 2, uncontrolled, with complications ([place]) 38/18/2993  . Thrombocytopenia ([place]) 05/24/2018  . Confusion   . Metabolic acidosis 71/69/6789  . Iron deficiency anemia 05/11/2018  . Vitamin D deficiency 05/11/2018  . Respiratory failure ([place])   . Acute renal failure superimposed on stage 4 chronic kidney disease ([place])   . End stage liver disease ([place])   . Pressure injury of skin 04/21/2018  . Altered mental status, unspecified 04/21/2018  . HCAP (healthcare-associated pneumonia) 04/21/2018  . Acute renal failure (ARF) ([place]) 04/07/2018  . Closed fracture of left upper extremity with routine healing 03/20/2018  . Difficult intravenous access 03/16/2018  . Hyperammonemia ([place]) 03/14/2018  . Hypocalcemia 03/08/2018  . AKI (acute kidney injury) ([place]) 03/08/2018  . Physical deconditioning 03/08/2018  . Pressure injury of lower back, stage 2 ([place]) 03/08/2018  . Closed intertrochanteric fracture of left femur ([place]) 01/21/2018  . Fall 01/21/2018  . Fracture of left ulna 01/21/2018  . Fracture of left tibia 01/21/2018  . Trauma 01/21/2018  . Diabetic ulcer of toe associated with type 2 diabetes mellitus, limited to breakdown of skin ([place]) 02/14/2017  . Anemia 12/08/2016  . Diabetic ketoacidosis ([place]) 12/08/2016  . GERD (gastroesophageal reflux disease) 12/08/2016  . Hyponatremia 12/08/2016  . 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  . Open wound of left foot except toes with complication 38/10/1749  . Benign hypertension with chronic kidney disease, stage III ([place]) 06/22/2016  . Abscess of scrotum 09/19/2014  . Cellulitis of toe  of right foot 09/19/2014  . Axillary abscess 02/03/2014  . Hyperkalemia 02/03/2014    Current Outpatient Medications on File Prior to Visit  Medication Sig Dispense Refill  . calcium carbonate (OS-CAL) 1250 (500 Ca) MG chewable tablet Chew by mouth.    . ferric citrate (AURYXIA) 1 GM 210 MG(Fe) tablet     . ibuprofen (ADVIL) 200 MG tablet Take by mouth.    . insulin lispro (ADMELOG SOLOSTAR) 100 UNIT/ML KwikPen     . Methoxy PEG-Epoetin Beta (MIRCERA IJ) Mircera    . sevelamer carbonate (RENVELA) 800 MG tablet     . acetaminophen (TYLENOL) 325 MG tablet Take 650 mg by mouth every 8 (eight) hours as needed for fever (pain).    Marland Kitchen allopurinol (ZYLOPRIM) 100 MG tablet Take 100 mg by mouth daily.     . Amino Acids-Protein Hydrolys (FEEDING SUPPLEMENT, PRO-STAT 64,) LIQD Take 30 mLs by mouth 3 (three) times daily with meals.    . calcium carbonate (TUMS) 500 MG chewable tablet Chew 1,000 mg by mouth in the morning, at noon, and at bedtime.    . carbamazepine (CARBATROL) 100 MG 12 hr capsule Take 100 mg by mouth 2 (two) times daily.    . carbamazepine (TEGRETOL XR) 400 MG 12 hr tablet Take 800 mg by mouth 2 (two) times daily.    . cephALEXin (KEFLEX) 250 MG capsule Take by mouth.    . cloNIDine (CATAPRES) 0.1 MG tablet Take 0.1 mg by mouth 2 (two) times daily as needed (SBP ABOVE 170).     . cyclobenzaprine (FLEXERIL) 5 MG tablet Take 5 mg by mouth at bedtime as needed.    . DULoxetine (CYMBALTA) 20 MG capsule Take 20 mg by mouth daily.    Marland Kitchen guaiFENesin (MUCINEX) 600 MG 12 hr tablet Take 600 mg by mouth every 12 (twelve) hours as needed for cough (congestion).    Marland Kitchen HYDROcodone-acetaminophen (NORCO) 5-325 MG tablet Take 1 tablet by mouth every 6 (six) hours as needed for moderate pain. 30 tablet 0  . insulin aspart (NOVOLOG) 100 UNIT/ML injection Inject 0-15 Units into the skin 4 (four) times daily -  with meals and at bedtime. 70-120=0 units, 121-150=2 units, 151-200=3 units, 201-250=5 units, 251-300=8 units, 301-350=11 units, 351-400=15 units, >400 call MD    . Insulin Glargine  (BASAGLAR KWIKPEN) 100 UNIT/ML SOPN Inject 0.28 mLs (28 Units total) into the skin at bedtime. (Patient taking differently: Inject 26 Units into the skin at bedtime. ) 8.4 mL 0  . Ipratropium-Albuterol (COMBIVENT) 20-100 MCG/ACT AERS respimat Inhale 1 puff into the lungs every 6 (six) hours as needed for wheezing.    Marland Kitchen ketoconazole (NIZORAL) 2 % shampoo Apply 1 application topically See admin instructions. APPLY TO SCALP (SHAMPOO) TOPICALLY EVERY DAY SHIFT EVERY TUE, FRI, SUN FOR DRY SCALP (BATH DAYS) APPLY TO SCALP 3X A WEEK & RINSE.    Marland Kitchen lactulose (CHRONULAC) 10 GM/15ML solution Take 15 mLs (10 g total) by mouth 2 (two) times daily. (Patient taking differently: Take 30 g by mouth 2 (two) times daily. ) 236 mL 0  . lidocaine-prilocaine (EMLA) cream Apply 1 application topically every Monday, Wednesday, and Friday.    . Multiple Vitamin (MULTIVITAMIN WITH MINERALS) TABS tablet Take 1 tablet by mouth at bedtime.    Marland Kitchen omeprazole (PRILOSEC) 20 MG capsule Take 20 mg by mouth daily.     . ondansetron (ZOFRAN) 4 MG tablet Take 4 mg by mouth every 8 (eight) hours as needed for nausea or vomiting.     Marland Kitchen  PHENobarbital (LUMINAL) 64.8 MG tablet Take 4 tablets (259.2 mg total) by mouth at bedtime. 120 tablet 0  . polyethylene glycol (MIRALAX / GLYCOLAX) 17 g packet Take 17 g by mouth daily as needed. 14 each 0  . Sennosides (SENNA LAX PO) Take 1 tablet by mouth at bedtime. FOR CONSTIPATION.    . simvastatin (ZOCOR) 20 MG tablet Take 20 mg by mouth at bedtime.   3  . tamsulosin (FLOMAX) 0.4 MG CAPS capsule Take 0.4 mg by mouth at bedtime.    . TRADJENTA 5 MG TABS tablet Take 5 mg by mouth daily.     No current facility-administered medications on file prior to visit.   Allergies  Allergen Reactions  . Fish Oil Swelling and Other (See Comments)       . Ethyl Acetate     No results found for this or any previous visit (from the past 2160 hour(s)).  Objective: General: Patient is awake, alert, and  oriented x 3 and in no acute distress.  Integument: Skin is warm, dry and supple bilateral. Nails are tender, long, thickened and  dystrophic with subungual debris, consistent with onychomycosis, 1-5 bilateral worse at left 2nd toenail. No acute signs of infection. +Minimal callus sub met 1 on right.  No open lesions present bilateral. Severe dry skin with fish scale appearance to both legs. Remaining integument unremarkable.  Vasculature:  Dorsalis Pedis pulse 1/4 bilateral. Posterior Tibial pulse  0/4 bilateral.  Capillary fill time <3 sec 1-5 bilateral. No hair growth to the level of the digits. Temperature gradient within normal limits. No varicosities present bilateral. No edema present bilateral.   Neurology: Gross sensation present via light touch bilateral.  Musculoskeletal: Asymptomatic pes planus pedal deformities noted bilateral. Muscular strength 5/5 in all lower extremity muscular groups bilateral without pain on range of motion . No tenderness with calf compression bilateral.  Assessment and Plan: Problem List Items Addressed This Visit      Genitourinary   ESRD on hemodialysis ([facility])    Other Visit Diagnoses    Pain due to onychomycosis of toenails of both feet    -  Primary   Relevant Medications   cephALEXin (KEFLEX) 250 MG capsule   Xerosis of skin       Foot pain, bilateral       Diabetic polyneuropathy associated with type 2 diabetes mellitus (HCC)       Relevant Medications   cyclobenzaprine (FLEXERIL) 5 MG tablet   insulin lispro (ADMELOG SOLOSTAR) 100 UNIT/ML KwikPen   PVD (peripheral vascular disease) ([place])           -Examined patient. -Discussed and educated patient on diabetic foot care, especially with  regards to the vascular, neurological and musculoskeletal systems.  -Stressed the importance of good glycemic control and the detriment of not  controlling glucose levels in relation to the foot. -Mechanically debrided all nails 1-5 bilateral using  sterile nail nipper and filed with dremel without incident  -At no additional charge debrided callus x1 on right using chisel blade without incident -Recommend daily skin cream for dry skin -Recommend for PCP to consider increasing Cymbalta that he is on for any nerve pains -Answered all patient questions -Patient to return  in 3 months for at risk foot care -Patient advised to call the office if any problems or questions arise in the meantime.  Landis Martins, DPM

## 2020-01-02 NOTE — Addendum Note (Signed)
Addended by: [place]ford Mon R on: 01/02/2020 10:24 AM   Modules accepted: Orders

## 2020-01-27 ENCOUNTER — Other Ambulatory Visit: Payer: Self-pay

## 2020-01-27 ENCOUNTER — Emergency Department (HOSPITAL_COMMUNITY)
Admission: EM | Admit: 2020-01-27 | Discharge: 2020-01-28 | Disposition: A | Discharge status: Home / Self Care | Payer: Medicare Other | Attending: Emergency Medicine | Admitting: Emergency Medicine

## 2020-01-27 ENCOUNTER — Emergency Department (HOSPITAL_COMMUNITY): Payer: Medicare Other

## 2020-01-27 DIAGNOSIS — I132 Hypertensive heart and chronic kidney disease with heart failure and with stage 5 chronic kidney disease, or end stage renal disease: Secondary | ICD-10-CM | POA: Diagnosis not present

## 2020-01-27 DIAGNOSIS — Z79899 Other long term (current) drug therapy: Secondary | ICD-10-CM | POA: Diagnosis not present

## 2020-01-27 DIAGNOSIS — Z8616 Personal history of COVID-19: Secondary | ICD-10-CM | POA: Insufficient documentation

## 2020-01-27 DIAGNOSIS — L97509 Non-pressure chronic ulcer of other part of unspecified foot with unspecified severity: Secondary | ICD-10-CM | POA: Insufficient documentation

## 2020-01-27 DIAGNOSIS — F17210 Nicotine dependence, cigarettes, uncomplicated: Secondary | ICD-10-CM | POA: Insufficient documentation

## 2020-01-27 DIAGNOSIS — I509 Heart failure, unspecified: Secondary | ICD-10-CM | POA: Insufficient documentation

## 2020-01-27 DIAGNOSIS — E11621 Type 2 diabetes mellitus with foot ulcer: Secondary | ICD-10-CM | POA: Insufficient documentation

## 2020-01-27 DIAGNOSIS — E1122 Type 2 diabetes mellitus with diabetic chronic kidney disease: Secondary | ICD-10-CM | POA: Insufficient documentation

## 2020-01-27 DIAGNOSIS — E1110 Type 2 diabetes mellitus with ketoacidosis without coma: Secondary | ICD-10-CM | POA: Insufficient documentation

## 2020-01-27 DIAGNOSIS — N186 End stage renal disease: Secondary | ICD-10-CM | POA: Diagnosis not present

## 2020-01-27 DIAGNOSIS — Z794 Long term (current) use of insulin: Secondary | ICD-10-CM | POA: Insufficient documentation

## 2020-01-27 DIAGNOSIS — I209 Angina pectoris, unspecified: Secondary | ICD-10-CM | POA: Insufficient documentation

## 2020-01-27 DIAGNOSIS — R079 Chest pain, unspecified: Secondary | ICD-10-CM

## 2020-01-27 HISTORY — DX: Chest pain, unspecified: R07.9

## 2020-01-27 LAB — CBC
HCT: 25.1 % — ABNORMAL LOW (ref 39.0–52.0)
Hemoglobin: 8.9 g/dL — ABNORMAL LOW (ref 13.0–17.0)
MCH: 37.6 pg — ABNORMAL HIGH (ref 26.0–34.0)
MCHC: 35.5 g/dL (ref 30.0–36.0)
MCV: 105.9 fL — ABNORMAL HIGH (ref 80.0–100.0)
Platelets: 122 10*3/uL — ABNORMAL LOW (ref 150–400)
RBC: 2.37 MIL/uL — ABNORMAL LOW (ref 4.22–5.81)
RDW: 15.9 % — ABNORMAL HIGH (ref 11.5–15.5)
WBC: 4.7 10*3/uL (ref 4.0–10.5)
nRBC: 0.4 % — ABNORMAL HIGH (ref 0.0–0.2)

## 2020-01-27 LAB — BASIC METABOLIC PANEL
Anion gap: 18 — ABNORMAL HIGH (ref 5–15)
BUN: 57 mg/dL — ABNORMAL HIGH (ref 6–20)
CO2: 16 mmol/L — ABNORMAL LOW (ref 22–32)
Calcium: 7 mg/dL — ABNORMAL LOW (ref 8.9–10.3)
Chloride: 94 mmol/L — ABNORMAL LOW (ref 98–111)
Creatinine, Ser: 10.56 mg/dL — ABNORMAL HIGH (ref 0.61–1.24)
GFR, Estimated: 5 mL/min — ABNORMAL LOW (ref >60)
Glucose, Bld: 268 mg/dL — ABNORMAL HIGH (ref 70–99)
Potassium: 4.4 mmol/L (ref 3.5–5.1)
Sodium: 128 mmol/L — ABNORMAL LOW (ref 135–145)

## 2020-01-27 LAB — TROPONIN I (HIGH SENSITIVITY)
Troponin I (High Sensitivity): 5 ng/L (ref <18)
Troponin I (High Sensitivity): 4 ng/L (ref <18)

## 2020-01-27 MED ORDER — NITROGLYCERIN 0.4 MG SL SUBL
0.4000 mg | SUBLINGUAL_TABLET | SUBLINGUAL | Status: DC | PRN
  Administered 2020-01-27: 0.4 mg via SUBLINGUAL
  Filled 2020-01-27: qty 1

## 2020-01-27 NOTE — ED Triage Notes (Signed)
Dialysis pt here for eval of L sided cp with inspiration. MWF dialysis patient, missed Friday's tx d/t getting teeth pulled on Thursday. Due today but came to ED instead.

## 2020-01-27 NOTE — ED Provider Notes (Signed)
[facility] EMERGENCY DEPARTMENT Provider Note   CSN: [id number] Arrival date & time: 01/27/20  3546     History Chief Complaint  Patient presents with  . Chest Pain    Eddie Davies is a 50 y.o. male.  HPI  HPI: A 50 year old patient with a history of treated diabetes, hypertension, hypercholesterolemia and obesity presents for evaluation of chest pain. Initial onset of pain was more than 6 hours ago. The patient's chest pain is described as heaviness/pressure/tightness and is not worse with exertion. The patient's chest pain is middle- or left-sided, is not well-localized, is not sharp and does not radiate to the arms/jaw/neck. The patient does not complain of nausea and denies diaphoresis. The patient has smoked in the past 90 days. The patient has no history of stroke, has no history of peripheral artery disease and has no relevant family history of coronary artery disease (first degree relative at less than age 22).    Patient smokes between half a pack to pack and a half a day.  He has been doing so for the last 20+ years.  He reports that he started having left-sided chest pain with inspiration.  The chest pain is radiating to the back.  Now the pain is not worse with inspiration, it does sitting on the left side.  The pain is described as squeezing pain.  At the moment, nothing is making it worse or better.  Patient denies any associated diaphoresis, shortness of breath, nausea.  He has had similar pain in the past but never received further care.  Patient has not received dialysis since Friday.  Pt has no hx of PE, DVT and denies any exogenous hormone (testosterone / estrogen) use, long distance travels or surgery in the past 6 weeks, active cancer, recent immobilization.  Patient also denies any numbness, tingling, weakness in his legs.  Past medical history is significant for ESLD, ESRD, diabetes.  Past Medical History:  Diagnosis Date  . Anxiety  disorder, unspecified   . Cirrhosis ([place]) 04/2018  . Diabetes mellitus without complication ([place])    type II  . Enterocolitis due to Clostridium difficile, not specified as recurrent 08/09/2018   admission  . Epilepsy, unspecified, not intractable, without status epilepticus ([place])   . ESRD (end stage renal disease) ([place])    MWF   . Gout, unspecified   . Heart failure, unspecified ([place])   . Hypertension   . Klebsiella pneumoniae (k. pneumoniae) as the cause of diseases classified elsewhere 06/25/2018   admission  . Liver failure ([place])   . Major depressive disorder, single episode, unspecified   . Metabolic acidosis 56/8127  . Pneumonia   . Polyneuropathy, unspecified   . Tobacco use   . Vitamin D deficiency     Patient Active Problem List   Diagnosis Date Noted  . Fluid overload, unspecified 04/11/2019  . Allergy, unspecified, initial encounter 04/06/2019  . Retroperitoneal abscess ([place]) 03/13/2019  . Infection due to ESBL-producing Klebsiella pneumoniae 03/13/2019  . Personal history of COVID-19 02/25/2019  . Postprocedural hematoma of a genitourinary system organ or structure following a genitourinary system procedure 02/14/2019  . Coagulopathy ([place]) 02/06/2019  . Failure to thrive in adult 01/21/2019  . 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 Protein-calorie malnutrition, severe ([place]) 01/21/2019  . COVID-19 virus infection 01/09/2019  . History of ESBL Klebsiella pneumoniae infection 01/09/2019  . Septic shock ([place]) 01/09/2019  . ESRD on hemodialysis ([place]) 12/27/2018  . Gram negative sepsis ([place]) 12/27/2018  .  History of Clostridioides difficile colitis 12/27/2018  . Encounter for immunization 09/12/2018  . Secondary hyperparathyroidism of renal origin ([place]) 08/28/2018  . Pain, unspecified 07/09/2018  . Bacteremia due to Klebsiella pneumoniae 06/19/2018  . Fever 06/18/2018  . ESRD (end stage renal disease) on dialysis ([place]) 06/18/2018  . Anemia due to chronic  kidney disease 06/18/2018  . Leukocytosis 06/18/2018  . Diarrhea, unspecified 06/08/2018  . Dyspnea, unspecified 06/01/2018  . Other cirrhosis of liver ([place]) 06/01/2018  . Acute metabolic encephalopathy 02/63/7858  . Seizures ([place]) 05/24/2018  . BPH (benign prostatic hyperplasia) 05/24/2018  . Sepsis ([place]) 05/24/2018  . Benign essential HTN 05/24/2018  . HLD (hyperlipidemia) 05/24/2018  . Diabetes mellitus type 2, uncontrolled, with complications ([place]) 85/02/7739  . Thrombocytopenia ([place]) 05/24/2018  . Confusion   . Metabolic acidosis 28/78/6767  . Iron deficiency anemia 05/11/2018  . Vitamin D deficiency 05/11/2018  . Respiratory failure ([place])   . Acute renal failure superimposed on stage 4 chronic kidney disease ([place])   . End stage liver disease ([place])   . Pressure injury of skin 04/21/2018  . Altered mental status, unspecified 04/21/2018  . HCAP (healthcare-associated pneumonia) 04/21/2018  . Acute renal failure (ARF) ([place]) 04/07/2018  . Closed fracture of left upper extremity with routine healing 03/20/2018  . Difficult intravenous access 03/16/2018  . Hyperammonemia ([place]) 03/14/2018  . Hypocalcemia 03/08/2018  . AKI (acute kidney injury) ([place]) 03/08/2018  . Physical deconditioning 03/08/2018  . Pressure injury of lower back, stage 2 ([place]) 03/08/2018  . Closed intertrochanteric fracture of left femur ([place]) 01/21/2018  . Fall 01/21/2018  . Fracture of left ulna 01/21/2018  . Fracture of left tibia 01/21/2018  . Trauma 01/21/2018  . Diabetic ulcer of toe associated with type 2 diabetes mellitus, limited to breakdown of skin ([place]) 02/14/2017  . Anemia 12/08/2016  . Diabetic ketoacidosis ([place]) 12/08/2016  . GERD (gastroesophageal reflux disease) 12/08/2016  . Hyponatremia 12/08/2016  . 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  . Open wound of left foot except toes with complication 20/94/7096  . Benign hypertension with chronic kidney disease, stage III ([place]) 06/22/2016  . Abscess of  scrotum 09/19/2014  . Cellulitis of toe of right foot 09/19/2014  . Axillary abscess 02/03/2014  . Hyperkalemia 02/03/2014    Past Surgical History:  Procedure Laterality Date  . APPENDECTOMY  1981  . AV FISTULA PLACEMENT Right 10/23/2018   Procedure: ARTERIOVENOUS (AV) FISTULA CREATION RIGHT ARM;  Surgeon: [name], MD;  Location: [facility];  Service: Vascular;  Laterality: Right;  . CHOLECYSTECTOMY    . HERNIA REPAIR  2007  . IR FLUORO GUIDE CV LINE RIGHT  05/30/2018  . IR FLUORO GUIDE CV LINE RIGHT  03/18/2019  . IR FLUORO GUIDE CV LINE RIGHT  03/22/2019  . IR REMOVAL TUN CV CATH W/O FL  03/15/2019  . IR SINUS/FIST TUBE CHK-NON GI  04/04/2019  . IR US GUIDE VASC ACCESS RIGHT  05/30/2018  . IR US GUIDE VASC ACCESS RIGHT  03/18/2019  . IR US GUIDE VASC ACCESS RIGHT  03/22/2019  . NEPHRECTOMY Left 2020       Family History  Problem Relation Age of Onset  . Seizures Neg Hx     Social History   Tobacco Use  . Smoking status: Current Some Day Smoker    Packs/day: 0.25    Types: Cigarettes  . Smokeless tobacco: Never Used  Vaping Use  . Vaping Use: Never used  Substance Use Topics  . Alcohol use: Not Currently  .  Drug use: Never    Home Medications Prior to Admission medications   Medication Sig Start Date End Date Taking? Authorizing Provider  acetaminophen (TYLENOL) 325 MG tablet Take 650 mg by mouth every 8 (eight) hours as needed for fever (pain).   Yes [provider]  allopurinol (ZYLOPRIM) 100 MG tablet Take 100 mg by mouth daily.  08/01/18  Yes [provider]  Amino Acids-Protein Hydrolys (FEEDING SUPPLEMENT, PRO-STAT 64,) LIQD Take 30 mLs by mouth in the morning and at bedtime.   Yes [provider]  calcium carbonate (TUMS - DOSED IN MG ELEMENTAL CALCIUM) 500 MG chewable tablet Chew 2,000 mg by mouth as directed. Give 4 tablet (2000 mg) TID & Give 5 tablets (2500 mg) on MWF 07/19/19  Yes [provider]  carbamazepine (TEGRETOL XR)  100 MG 12 hr tablet Take 100 mg by mouth 2 (two) times daily.   Yes [provider]  carbamazepine (TEGRETOL XR) 400 MG 12 hr tablet Take 800 mg by mouth 2 (two) times daily.   Yes [provider]  chlorhexidine (PERIDEX) 0.12 % solution Use as directed 15 mLs in the mouth or throat 2 (two) times daily. 01/23/20  Yes [provider]  DULoxetine (CYMBALTA) 20 MG capsule Take 20 mg by mouth daily.   Yes [provider]  HYDROcodone-acetaminophen (NORCO) 5-325 MG tablet Take 1 tablet by mouth every 6 (six) hours as needed for moderate pain. 12/05/18  Yes [place] Eric J, DO  insulin aspart (NOVOLOG) 100 UNIT/ML injection Inject 0-15 Units into the skin as directed. Inject 5 units daily with meals TID & Inject TID Per Sliding Scale with meals If 121-200=2 units, 201-250=5 units, 251-300=8 units, 301-350=11 units, 351-400=15 units >400 call MD   Yes [provider]  Insulin Glargine (BASAGLAR KWIKPEN) 100 UNIT/ML SOPN Inject 0.28 mLs (28 Units total) into the skin at bedtime. Patient taking differently: Inject 35 Units into the skin at bedtime. 12/05/18 08/26/19 Yes [place] Eric J, DO  Insulin Glargine (BASAGLAR KWIKPEN) 100 UNIT/ML Inject 35 Units into the skin at bedtime.   Yes [provider]  ketoconazole (NIZORAL) 2 % shampoo Apply 1 application topically See admin instructions. APPLY TO SCALP (SHAMPOO) TOPICALLY EVERY DAY SHIFT EVERY TUE, FRI, SUN FOR DRY SCALP (BATH DAYS) APPLY TO SCALP 3X A WEEK & RINSE. 04/11/19  Yes [provider]  lactulose (CHRONULAC) 10 GM/15ML solution Take 15 mLs (10 g total) by mouth 2 (two) times daily. 05/31/18  Yes [name], [name] K, MD  Multiple Vitamin (MULTIVITAMIN WITH MINERALS) TABS tablet Take 1 tablet by mouth at bedtime.   Yes [provider]  Nutritional Supplements (NEPRO PO) Take 1 Bottle by mouth at bedtime.   Yes [provider]  omeprazole (PRILOSEC) 20 MG capsule Take 20 mg by mouth  daily.  07/04/18  Yes [provider]  penicillin v potassium (VEETID) 500 MG tablet Take 500 mg by mouth 4 (four) times daily. 01/23/20  Yes [provider]  PHENobarbital (LUMINAL) 64.8 MG tablet Take 4 tablets (259.2 mg total) by mouth at bedtime. 12/05/18 08/26/19 Yes [place] Eric J, DO  PHENobarbital (LUMINAL) 64.8 MG tablet Take 259.2 mg by mouth at bedtime. Take 4 tablets (259.20 mg) at bedtime   Yes [provider]  simvastatin (ZOCOR) 20 MG tablet Take 20 mg by mouth at bedtime.    Yes [provider]  tamsulosin (FLOMAX) 0.4 MG CAPS capsule Take 0.4 mg by mouth at bedtime.   Yes  [provider]  TRADJENTA 5 MG TABS tablet Take 5 mg by mouth daily. 08/19/19  Yes [provider]  cyclobenzaprine (FLEXERIL) 5 MG tablet Take 5 mg by mouth daily as needed for muscle spasms. 12/02/19   [provider]  guaiFENesin (MUCINEX) 600 MG 12 hr tablet Take 600 mg by mouth every 12 (twelve) hours as needed for cough (congestion).    [provider]  Ipratropium-Albuterol (COMBIVENT) 20-100 MCG/ACT AERS respimat Inhale 1 puff into the lungs every 6 (six) hours as needed for wheezing.    [provider]  Lidocaine 3 % CREA Apply 1 application topically 3 (three) times a week. MWF    [provider]  lidocaine-prilocaine (EMLA) cream Apply 1 application topically every Monday, Wednesday, and Friday.    [provider]  ondansetron (ZOFRAN) 4 MG tablet Take 4 mg by mouth every 8 (eight) hours as needed for nausea or vomiting.  08/03/18   [provider]  polyethylene glycol (MIRALAX / GLYCOLAX) 17 g packet Take 17 g by mouth daily as needed. Patient taking differently: Take 17 g by mouth daily as needed for moderate constipation. 03/23/19   Raiford Noble Latif, DO  promethazine (PHENERGAN) 25 MG/ML injection Inject 25 mg into the muscle every 6 (six) hours as needed for nausea or vomiting.    [provider]    Allergies    Fish oil and Ethyl acetate  Review of Systems   Review of Systems  Constitutional: Positive for activity change. Negative for appetite change and diaphoresis.  Respiratory: Negative for cough and shortness of breath.   Cardiovascular: Positive for chest pain.  Gastrointestinal: Negative for abdominal pain, nausea and vomiting.  Allergic/Immunologic: Positive for immunocompromised state.  Hematological: Does not bruise/bleed easily.  All other systems reviewed and are negative.   Physical Exam Updated Vital Signs BP 120/83   Pulse 81   Temp 98 F (36.7 C) (Oral)   Resp 14   SpO2 98%   Physical Exam Vitals and nursing note reviewed.  Constitutional:      Appearance: He is well-developed.  HENT:     Head: Normocephalic and atraumatic.  Eyes:     Extraocular Movements: EOM normal.     Conjunctiva/sclera: Conjunctivae normal.     Pupils: Pupils are equal, round, and reactive to light.  Cardiovascular:     Rate and Rhythm: Normal rate and regular rhythm.     Pulses:          Posterior tibial pulses are 2+ on the right side and 2+ on the left side.     Heart sounds: Normal heart sounds.     Comments: 2+ and equal femoral pulse bilaterally Pulmonary:     Effort: Pulmonary effort is normal. No respiratory distress.     Breath sounds: Normal breath sounds. No wheezing.  Abdominal:     General: Bowel sounds are normal. There is no distension.     Palpations: Abdomen is soft.     Tenderness: There is no abdominal tenderness. There is no guarding or rebound.  Musculoskeletal:     Cervical back: Normal range of motion and neck supple.     Right lower leg: No tenderness. No edema.     Left lower leg: No tenderness. No edema.  Skin:    General: Skin is warm.  Neurological:     Mental Status: He is alert and oriented to person, place, and time.     ED Results / Procedures /  Treatments   Labs (all labs ordered are listed, but only abnormal  results are displayed) Labs Reviewed  BASIC METABOLIC PANEL - Abnormal; Notable for the following components:      Result Value   Sodium 128 (*)    Chloride 94 (*)    CO2 16 (*)    Glucose, Bld 268 (*)    BUN 57 (*)    Creatinine, Ser 10.56 (*)    Calcium 7.0 (*)    GFR, Estimated 5 (*)    Anion gap 18 (*)    All other components within normal limits  CBC - Abnormal; Notable for the following components:   RBC 2.37 (*)    Hemoglobin 8.9 (*)    HCT 25.1 (*)    MCV 105.9 (*)    MCH 37.6 (*)    RDW 15.9 (*)    Platelets 122 (*)    nRBC 0.4 (*)    All other components within normal limits  TROPONIN I (HIGH SENSITIVITY)  TROPONIN I (HIGH SENSITIVITY)    EKG EKG Interpretation  Date/Time:  Monday January 27 2020 09:08:16 EST Ventricular Rate:  76 PR Interval:  216 QRS Duration: 124 QT Interval:  420 QTC Calculation: 472 R Axis:   29 Text Interpretation: Sinus rhythm with 1st degree A-V block Non-specific intra-ventricular conduction delay Borderline ECG No acute changes No significant change since last tracing Confirmed by [name] [phone]) on 01/27/2020 9:48:19 PM   Radiology DG Chest 2 View  Result Date: 01/27/2020 CLINICAL DATA:  Left-sided chest pain with inspiration. EXAM: CHEST - 2 VIEW COMPARISON:  08/26/2019 and CT chest 04/21/2016. FINDINGS: Trachea is midline. Heart size stable. Mild basilar predominant interstitial prominence and indistinctness. Lungs are low in volume. No definite pleural fluid. IMPRESSION: Suspect mild basilar dependent pulmonary edema. Difficult to exclude a viral or atypical pneumonia. Electronically Signed   By: Lorin Picket M.D.   On: 01/27/2020 09:41    Procedures Procedures (including critical care time)  Medications Ordered in ED Medications  nitroGLYCERIN (NITROSTAT) SL tablet 0.4 mg (0.4 mg Sublingual Given 01/27/20 2334)    ED Course  I have reviewed the triage vital signs and the nursing notes.  Pertinent labs & imaging  results that were available during my care of the patient were reviewed by me and considered in my medical decision making (see chart for details).  Clinical Course as of 01/28/20 0006  Mon Jan 27, 2020  2350 Patient reassessed.  He was given 1 nitro and he reports that his pain had resolved.  He is pain-free now.  Plan is to discharge the patient with cardiology follow-up.  I will send a message to cardiovascular to get the patient in the soon as possible for an outpatient evaluation. Patient will return to the ER if he starts having severe chest pain again.  Patient reassessed. Pt is comfortable at this time.  Results of the workup discussed. Strict ER return precautions discussed. Follow up instruction discussed, and pt agrees with the plan and is comfortable with it.  [AN]  Tue Jan 28, 2020  0002 Given the pain resolved with nitro, suspicion for PE is now even lower.  We will not proceed with CT PE.  Instead patient will be advised to follow-up with cardiology.  I have sent a message to cardiovascular to see if patient can be provided with a close follow-up. [AN]    Clinical Course User Index [AN] [name], MD   MDM Rules/Calculators/A&P HEAR Score: 5  50 year old male comes in a chief complaint of chest pain.  He has history of ESRD, ESLD, COVID-19 in 2021, diabetes, heavy smoking history.  No history of CAD, PE.  Patient reports that he started having pain that was worse with deep inspiration.  Currently he is having milder pain, and it is no longer aggravated with deep inspiration.  There is no cough.  Patient denies any COVID-19-like symptoms or exposure.  He resides at a nursing facility.  He has not had dialysis since Friday.  He denies any shortness of breath with laying flat.  The chest pain is no other concerning constitutional such as nausea, diaphoresis.   With his extensive smoking history, ESLD we considered ACS, PE, dissection in the  differential diagnosis.  Patient's cardiovascular exam is reassuring.  PE also considered in the differential diagnosis.  We will give him nitroglycerin.  If he gets pain-free, then we will presume that the pain is angina equivalent.  His high-sensitivity troponins are normal 82 and that will have bearing on disposition.  If patient does not get chest pain-free, then we will proceed with CT PE and decide if he needs admission for ACS rule out with a hear score of 5 and persistent chest pain.  Final Clinical Impression(s) / ED Diagnoses Final diagnoses:  Angina pectoris [facility])    Rx / DC Orders ED Discharge Orders    None       [name], MD 01/28/20 0006

## 2020-01-27 NOTE — Discharge Instructions (Signed)
We saw you in the ER for the chest pain/shortness of breath. All of our cardiac workup is normal, including labs, EKG and chest X-RAY are normal. We are not sure what is causing your discomfort, but we feel comfortable sending you home at this time. The workup in the ER is not complete, and you should follow up with cardiologist as requested.  Please return to the ER if you have worsening chest pain, shortness of breath, pain radiating to your jaw, shoulder, or back, sweats or fainting. Otherwise see the Cardiologist or your primary care doctor as requested.

## 2020-01-27 NOTE — ED Provider Notes (Incomplete)
[facility] EMERGENCY DEPARTMENT Provider Note   CSN: [id number] Arrival date & time: 01/27/20  6734     History Chief Complaint  Patient presents with  . Chest Pain    Eddie Davies is a 50 y.o. male.  HPI  HPI: A 50 year old patient with a history of treated diabetes, hypertension, hypercholesterolemia and obesity presents for evaluation of chest pain. Initial onset of pain was more than 6 hours ago. The patient's chest pain is described as heaviness/pressure/tightness and is not worse with exertion. The patient's chest pain is middle- or left-sided, is not well-localized, is not sharp and does not radiate to the arms/jaw/neck. The patient does not complain of nausea and denies diaphoresis. The patient has smoked in the past 90 days. The patient has no history of stroke, has no history of peripheral artery disease and has no relevant family history of coronary artery disease (first degree relative at less than age 56).    Patient smokes between half a pack to pack and a half a day.  He has been doing so for the last 20+ years.  He reports that he started having left-sided chest pain with inspiration.  The chest pain is radiating to the back.  Now the pain is not worse with inspiration, it does sitting on the left side.  The pain is described as squeezing pain.  At the moment, nothing is making it worse or better.  Patient denies any associated diaphoresis, shortness of breath, nausea.  He has had similar pain in the past but never received further care.  Patient has not received dialysis since Friday.  Pt has no hx of PE, DVT and denies any exogenous hormone (testosterone / estrogen) use, long distance travels or surgery in the past 6 weeks, active cancer, recent immobilization.  Patient also denies any numbness, tingling, weakness in his legs.  Past medical history is significant for ESLD, ESRD, diabetes.  Past Medical History:  Diagnosis Date  . Anxiety  disorder, unspecified   . Cirrhosis ([place]) 04/2018  . Diabetes mellitus without complication ([place])    type II  . Enterocolitis due to Clostridium difficile, not specified as recurrent 08/09/2018   admission  . Epilepsy, unspecified, not intractable, without status epilepticus ([place])   . ESRD (end stage renal disease) ([place])    MWF   . Gout, unspecified   . Heart failure, unspecified ([place])   . Hypertension   . Klebsiella pneumoniae (k. pneumoniae) as the cause of diseases classified elsewhere 06/25/2018   admission  . Liver failure ([place])   . Major depressive disorder, single episode, unspecified   . Metabolic acidosis 19/3790  . Pneumonia   . Polyneuropathy, unspecified   . Tobacco use   . Vitamin D deficiency     Patient Active Problem List   Diagnosis Date Noted  . Fluid overload, unspecified 04/11/2019  . Allergy, unspecified, initial encounter 04/06/2019  . Retroperitoneal abscess ([place]) 03/13/2019  . Infection due to ESBL-producing Klebsiella pneumoniae 03/13/2019  . Personal history of COVID-19 02/25/2019  . Postprocedural hematoma of a genitourinary system organ or structure following a genitourinary system procedure 02/14/2019  . Coagulopathy ([place]) 02/06/2019  . Failure to thrive in adult 01/21/2019  . 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 Protein-calorie malnutrition, severe ([place]) 01/21/2019  . COVID-19 virus infection 01/09/2019  . History of ESBL Klebsiella pneumoniae infection 01/09/2019  . Septic shock ([place]) 01/09/2019  . ESRD on hemodialysis ([place]) 12/27/2018  . Gram negative sepsis ([place]) 12/27/2018  .  History of Clostridioides difficile colitis 12/27/2018  . Encounter for immunization 09/12/2018  . Secondary hyperparathyroidism of renal origin ([place]) 08/28/2018  . Pain, unspecified 07/09/2018  . Bacteremia due to Klebsiella pneumoniae 06/19/2018  . Fever 06/18/2018  . ESRD (end stage renal disease) on dialysis ([place]) 06/18/2018  . Anemia due to chronic  kidney disease 06/18/2018  . Leukocytosis 06/18/2018  . Diarrhea, unspecified 06/08/2018  . Dyspnea, unspecified 06/01/2018  . Other cirrhosis of liver ([place]) 06/01/2018  . Acute metabolic encephalopathy 81/27/5170  . Seizures ([place]) 05/24/2018  . BPH (benign prostatic hyperplasia) 05/24/2018  . Sepsis ([place]) 05/24/2018  . Benign essential HTN 05/24/2018  . HLD (hyperlipidemia) 05/24/2018  . Diabetes mellitus type 2, uncontrolled, with complications ([place]) 01/74/9449  . Thrombocytopenia ([place]) 05/24/2018  . Confusion   . Metabolic acidosis 67/59/1638  . Iron deficiency anemia 05/11/2018  . Vitamin D deficiency 05/11/2018  . Respiratory failure ([place])   . Acute renal failure superimposed on stage 4 chronic kidney disease ([place])   . End stage liver disease ([place])   . Pressure injury of skin 04/21/2018  . Altered mental status, unspecified 04/21/2018  . HCAP (healthcare-associated pneumonia) 04/21/2018  . Acute renal failure (ARF) ([place]) 04/07/2018  . Closed fracture of left upper extremity with routine healing 03/20/2018  . Difficult intravenous access 03/16/2018  . Hyperammonemia ([place]) 03/14/2018  . Hypocalcemia 03/08/2018  . AKI (acute kidney injury) ([place]) 03/08/2018  . Physical deconditioning 03/08/2018  . Pressure injury of lower back, stage 2 ([place]) 03/08/2018  . Closed intertrochanteric fracture of left femur ([place]) 01/21/2018  . Fall 01/21/2018  . Fracture of left ulna 01/21/2018  . Fracture of left tibia 01/21/2018  . Trauma 01/21/2018  . Diabetic ulcer of toe associated with type 2 diabetes mellitus, limited to breakdown of skin ([place]) 02/14/2017  . Anemia 12/08/2016  . Diabetic ketoacidosis ([place]) 12/08/2016  . GERD (gastroesophageal reflux disease) 12/08/2016  . Hyponatremia 12/08/2016  . 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  . Open wound of left foot except toes with complication 46/65/9935  . Benign hypertension with chronic kidney disease, stage III ([place]) 06/22/2016  . Abscess of  scrotum 09/19/2014  . Cellulitis of toe of right foot 09/19/2014  . Axillary abscess 02/03/2014  . Hyperkalemia 02/03/2014    Past Surgical History:  Procedure Laterality Date  . APPENDECTOMY  1981  . AV FISTULA PLACEMENT Right 10/23/2018   Procedure: ARTERIOVENOUS (AV) FISTULA CREATION RIGHT ARM;  Surgeon: [name], MD;  Location: [facility];  Service: Vascular;  Laterality: Right;  . CHOLECYSTECTOMY    . HERNIA REPAIR  2007  . IR FLUORO GUIDE CV LINE RIGHT  05/30/2018  . IR FLUORO GUIDE CV LINE RIGHT  03/18/2019  . IR FLUORO GUIDE CV LINE RIGHT  03/22/2019  . IR REMOVAL TUN CV CATH W/O FL  03/15/2019  . IR SINUS/FIST TUBE CHK-NON GI  04/04/2019  . IR US GUIDE VASC ACCESS RIGHT  05/30/2018  . IR US GUIDE VASC ACCESS RIGHT  03/18/2019  . IR US GUIDE VASC ACCESS RIGHT  03/22/2019  . NEPHRECTOMY Left 2020       Family History  Problem Relation Age of Onset  . Seizures Neg Hx     Social History   Tobacco Use  . Smoking status: Current Some Day Smoker    Packs/day: 0.25    Types: Cigarettes  . Smokeless tobacco: Never Used  Vaping Use  . Vaping Use: Never used  Substance Use Topics  . Alcohol use: Not Currently  .  Drug use: Never    Home Medications Prior to Admission medications   Medication Sig Start Date End Date Taking? Authorizing Provider  acetaminophen (TYLENOL) 325 MG tablet Take 650 mg by mouth every 8 (eight) hours as needed for fever (pain).   Yes [provider]  allopurinol (ZYLOPRIM) 100 MG tablet Take 100 mg by mouth daily.  08/01/18  Yes [provider]  Amino Acids-Protein Hydrolys (FEEDING SUPPLEMENT, PRO-STAT 64,) LIQD Take 30 mLs by mouth in the morning and at bedtime.   Yes [provider]  calcium carbonate (TUMS - DOSED IN MG ELEMENTAL CALCIUM) 500 MG chewable tablet Chew 2,000 mg by mouth as directed. Give 4 tablet (2000 mg) TID & Give 5 tablets (2500 mg) on MWF 07/19/19  Yes [provider]  carbamazepine (TEGRETOL XR)  100 MG 12 hr tablet Take 100 mg by mouth 2 (two) times daily.   Yes [provider]  carbamazepine (TEGRETOL XR) 400 MG 12 hr tablet Take 800 mg by mouth 2 (two) times daily.   Yes [provider]  chlorhexidine (PERIDEX) 0.12 % solution Use as directed 15 mLs in the mouth or throat 2 (two) times daily. 01/23/20  Yes [provider]  DULoxetine (CYMBALTA) 20 MG capsule Take 20 mg by mouth daily.   Yes [provider]  HYDROcodone-acetaminophen (NORCO) 5-325 MG tablet Take 1 tablet by mouth every 6 (six) hours as needed for moderate pain. 12/05/18  Yes [place] Eric J, DO  insulin aspart (NOVOLOG) 100 UNIT/ML injection Inject 0-15 Units into the skin as directed. Inject 5 units daily with meals TID & Inject TID Per Sliding Scale with meals If 121-200=2 units, 201-250=5 units, 251-300=8 units, 301-350=11 units, 351-400=15 units >400 call MD   Yes [provider]  Insulin Glargine (BASAGLAR KWIKPEN) 100 UNIT/ML SOPN Inject 0.28 mLs (28 Units total) into the skin at bedtime. Patient taking differently: Inject 35 Units into the skin at bedtime. 12/05/18 08/26/19 Yes [place] Eric J, DO  Insulin Glargine (BASAGLAR KWIKPEN) 100 UNIT/ML Inject 35 Units into the skin at bedtime.   Yes [provider]  ketoconazole (NIZORAL) 2 % shampoo Apply 1 application topically See admin instructions. APPLY TO SCALP (SHAMPOO) TOPICALLY EVERY DAY SHIFT EVERY TUE, FRI, SUN FOR DRY SCALP (BATH DAYS) APPLY TO SCALP 3X A WEEK & RINSE. 04/11/19  Yes [provider]  lactulose (CHRONULAC) 10 GM/15ML solution Take 15 mLs (10 g total) by mouth 2 (two) times daily. 05/31/18  Yes [name], [name] K, MD  Multiple Vitamin (MULTIVITAMIN WITH MINERALS) TABS tablet Take 1 tablet by mouth at bedtime.   Yes [provider]  Nutritional Supplements (NEPRO PO) Take 1 Bottle by mouth at bedtime.   Yes [provider]  omeprazole (PRILOSEC) 20 MG capsule Take 20 mg by mouth  daily.  07/04/18  Yes [provider]  penicillin v potassium (VEETID) 500 MG tablet Take 500 mg by mouth 4 (four) times daily. 01/23/20  Yes [provider]  PHENobarbital (LUMINAL) 64.8 MG tablet Take 4 tablets (259.2 mg total) by mouth at bedtime. 12/05/18 08/26/19 Yes [place] Eric J, DO  PHENobarbital (LUMINAL) 64.8 MG tablet Take 259.2 mg by mouth at bedtime. Take 4 tablets (259.20 mg) at bedtime   Yes [provider]  simvastatin (ZOCOR) 20 MG tablet Take 20 mg by mouth at bedtime.    Yes [provider]  tamsulosin (FLOMAX) 0.4 MG CAPS capsule Take 0.4 mg by mouth at bedtime.   Yes  [provider]  TRADJENTA 5 MG TABS tablet Take 5 mg by mouth daily. 08/19/19  Yes [provider]  cyclobenzaprine (FLEXERIL) 5 MG tablet Take 5 mg by mouth daily as needed for muscle spasms. 12/02/19   [provider]  guaiFENesin (MUCINEX) 600 MG 12 hr tablet Take 600 mg by mouth every 12 (twelve) hours as needed for cough (congestion).    [provider]  Ipratropium-Albuterol (COMBIVENT) 20-100 MCG/ACT AERS respimat Inhale 1 puff into the lungs every 6 (six) hours as needed for wheezing.    [provider]  Lidocaine 3 % CREA Apply 1 application topically 3 (three) times a week. MWF    [provider]  lidocaine-prilocaine (EMLA) cream Apply 1 application topically every Monday, Wednesday, and Friday.    [provider]  ondansetron (ZOFRAN) 4 MG tablet Take 4 mg by mouth every 8 (eight) hours as needed for nausea or vomiting.  08/03/18   [provider]  polyethylene glycol (MIRALAX / GLYCOLAX) 17 g packet Take 17 g by mouth daily as needed. Patient taking differently: Take 17 g by mouth daily as needed for moderate constipation. 03/23/19   Raiford Noble Latif, DO  promethazine (PHENERGAN) 25 MG/ML injection Inject 25 mg into the muscle every 6 (six) hours as needed for nausea or vomiting.    [provider]    Allergies    Fish oil and Ethyl acetate  Review of Systems   Review of Systems  Constitutional: Positive for activity change. Negative for appetite change and diaphoresis.  Respiratory: Negative for cough and shortness of breath.   Cardiovascular: Positive for chest pain.  Gastrointestinal: Negative for abdominal pain, nausea and vomiting.  Genitourinary: Negative for dysuria.  Allergic/Immunologic: Positive for immunocompromised state.  Hematological: Does not bruise/bleed easily.  All other systems reviewed and are negative.   Physical Exam Updated Vital Signs BP 120/83   Pulse 81   Temp 98 F (36.7 C) (Oral)   Resp 14   SpO2 98%   Physical Exam  ED Results / Procedures / Treatments   Labs (all labs ordered are listed, but only abnormal results are displayed) Labs Reviewed  BASIC METABOLIC PANEL - Abnormal; Notable for the following components:      Result Value   Sodium 128 (*)    Chloride 94 (*)    CO2 16 (*)    Glucose, Bld 268 (*)    BUN 57 (*)    Creatinine, Ser 10.56 (*)    Calcium 7.0 (*)    GFR, Estimated 5 (*)    Anion gap 18 (*)    All other components within normal limits  CBC - Abnormal; Notable for the following components:   RBC 2.37 (*)    Hemoglobin 8.9 (*)    HCT 25.1 (*)    MCV 105.9 (*)    MCH 37.6 (*)    RDW 15.9 (*)    Platelets 122 (*)    nRBC 0.4 (*)    All other components within normal limits  TROPONIN I (HIGH SENSITIVITY)  TROPONIN I (HIGH SENSITIVITY)    EKG EKG Interpretation  Date/Time:  Monday January 27 2020 09:08:16 EST Ventricular Rate:  76 PR Interval:  216 QRS Duration: 124 QT Interval:  420 QTC Calculation: 472 R Axis:   29 Text Interpretation: Sinus rhythm with 1st degree A-V block Non-specific intra-ventricular conduction delay Borderline ECG No acute changes No significant change since last tracing Confirmed by Kathrynn Humble, Nazar Kuan [phone]) on  01/27/2020 9:48:19 PM   Radiology DG Chest 2  View  Result Date: 01/27/2020 CLINICAL DATA:  Left-sided chest pain with inspiration. EXAM: CHEST - 2 VIEW COMPARISON:  08/26/2019 and CT chest 04/21/2016. FINDINGS: Trachea is midline. Heart size stable. Mild basilar predominant interstitial prominence and indistinctness. Lungs are low in volume. No definite pleural fluid. IMPRESSION: Suspect mild basilar dependent pulmonary edema. Difficult to exclude a viral or atypical pneumonia. Electronically Signed   By: Lorin Picket M.D.   On: 01/27/2020 09:41    Procedures Procedures (including critical care time)  Medications Ordered in ED Medications  nitroGLYCERIN (NITROSTAT) SL tablet 0.4 mg (0.4 mg Sublingual Given 01/27/20 2334)    ED Course  I have reviewed the triage vital signs and the nursing notes.  Pertinent labs & imaging results that were available during my care of the patient were reviewed by me and considered in my medical decision making (see chart for details).  Clinical Course as of 01/28/20 0002  Mon Jan 27, 2020  2350 Patient reassessed.  He was given 1 nitro and he reports that his pain had resolved.  He is pain-free now.  Plan is to discharge the patient with cardiology follow-up.  I will send a message to cardiovascular to get the patient in the soon as possible for an outpatient evaluation. Patient will return to the ER if he starts having severe chest pain again.  Patient reassessed. Pt is comfortable at this time.  Results of the workup discussed. Strict ER return precautions discussed. Follow up instruction discussed, and pt agrees with the plan and is comfortable with it.  [AN]  Tue Jan 28, 2020  0002 Given the pain resolved with nitro, suspicion for PE is now even lower.  We will not proceed with CT PE.  Instead patient will be advised to follow-up with cardiology.  I have sent a message to cardiovascular to see if patient can be provided with a close follow-up. [AN]    Clinical Course User Index [AN] [name], MD   MDM Rules/Calculators/A&P HEAR Score: 5                        *** Final Clinical Impression(s) / ED Diagnoses Final diagnoses:  Angina pectoris ([place])    Rx / DC Orders ED Discharge Orders    None

## 2020-01-28 NOTE — ED Notes (Signed)
Pt to be discharged; report called to [facility] at [facility].

## 2020-01-28 NOTE — ED Notes (Signed)
Called [facility] 

## 2020-01-28 NOTE — ED Notes (Signed)
Pt states pain is completely relieved.

## 2020-01-31 ENCOUNTER — Other Ambulatory Visit: Payer: Self-pay

## 2020-01-31 DIAGNOSIS — Z720 Tobacco use: Secondary | ICD-10-CM | POA: Insufficient documentation

## 2020-01-31 DIAGNOSIS — G40909 Epilepsy, unspecified, not intractable, without status epilepticus: Secondary | ICD-10-CM | POA: Insufficient documentation

## 2020-01-31 DIAGNOSIS — K7290 Hepatic failure, unspecified without coma: Secondary | ICD-10-CM | POA: Insufficient documentation

## 2020-01-31 DIAGNOSIS — G629 Polyneuropathy, unspecified: Secondary | ICD-10-CM | POA: Insufficient documentation

## 2020-01-31 DIAGNOSIS — M109 Gout, unspecified: Secondary | ICD-10-CM | POA: Insufficient documentation

## 2020-01-31 DIAGNOSIS — N186 End stage renal disease: Secondary | ICD-10-CM | POA: Insufficient documentation

## 2020-01-31 DIAGNOSIS — Z992 Dependence on renal dialysis: Secondary | ICD-10-CM | POA: Insufficient documentation

## 2020-01-31 DIAGNOSIS — J189 Pneumonia, unspecified organism: Secondary | ICD-10-CM | POA: Insufficient documentation

## 2020-01-31 DIAGNOSIS — I509 Heart failure, unspecified: Secondary | ICD-10-CM | POA: Insufficient documentation

## 2020-01-31 DIAGNOSIS — F419 Anxiety disorder, unspecified: Secondary | ICD-10-CM | POA: Insufficient documentation

## 2020-01-31 DIAGNOSIS — F329 Major depressive disorder, single episode, unspecified: Secondary | ICD-10-CM | POA: Insufficient documentation

## 2020-02-04 ENCOUNTER — Other Ambulatory Visit: Payer: Self-pay

## 2020-02-04 ENCOUNTER — Encounter: Payer: Self-pay | Admitting: Cardiology

## 2020-02-04 ENCOUNTER — Ambulatory Visit (INDEPENDENT_AMBULATORY_CARE_PROVIDER_SITE_OTHER): Payer: Medicare Other | Admitting: Cardiology

## 2020-02-04 VITALS — BP 104/62 | HR 72 | Ht 74.0 in | Wt 244.6 lb

## 2020-02-04 DIAGNOSIS — E118 Type 2 diabetes mellitus with unspecified complications: Secondary | ICD-10-CM

## 2020-02-04 DIAGNOSIS — I10 Essential (primary) hypertension: Secondary | ICD-10-CM

## 2020-02-04 DIAGNOSIS — I129 Hypertensive chronic kidney disease with stage 1 through stage 4 chronic kidney disease, or unspecified chronic kidney disease: Secondary | ICD-10-CM

## 2020-02-04 DIAGNOSIS — R079 Chest pain, unspecified: Secondary | ICD-10-CM | POA: Diagnosis not present

## 2020-02-04 DIAGNOSIS — N183 Chronic kidney disease, stage 3 unspecified: Secondary | ICD-10-CM

## 2020-02-04 DIAGNOSIS — R072 Precordial pain: Secondary | ICD-10-CM

## 2020-02-04 NOTE — Patient Instructions (Signed)
Medication Instructions:  Your physician recommends that you continue on your current medications as directed. Please refer to the Current Medication list given to you today.  *If you need a refill on your cardiac medications before your next appointment, please call your pharmacy*   Lab Work: Your physician recommends that you return for lab work in: [place], CBC If you have labs (blood work) drawn today and your tests are completely normal, you will receive your results only by: Marland Kitchen MyChart Message (if you have MyChart) OR . A paper copy in the mail If you have any lab test that is abnormal or we need to change your treatment, we will call you to review the results.   Testing/Procedures:   [facility] [address] [place], [place] [address] Phone:  [phone]    Please arrive 15 minutes prior to your appointment time for registration and insurance purposes.  The test will take approximately 3 to 4 hours to complete; you may bring reading material.  If someone comes with you to your appointment, they will need to remain in the main lobby due to limited space in the testing area. **If you are pregnant or breastfeeding, please notify the nuclear lab prior to your appointment**  How to prepare for your Myocardial Perfusion Test: . Do not eat or drink 3 hours prior to your test, except you may have water. . Do not consume products containing caffeine (regular or decaffeinated) 12 hours prior to your test. (ex: coffee, chocolate, sodas, tea). . Do bring a list of your current medications with you.  If not listed below, you may take your medications as normal. . HOLD diabetic medication the morning of the test: Tradjenta. Take half of long acting insulin the night before test. . Do wear comfortable clothes (no dresses or overalls) and walking shoes, tennis shoes preferred (No heels or open toe shoes are allowed). . Do NOT wear cologne, perfume,  aftershave, or lotions (deodorant is allowed). . If these instructions are not followed, your test will have to be rescheduled.  Please report to [address] for your test.  If you have questions or concerns about your appointment, you can call the [facility] at [phone].  If you cannot keep your appointment, please provide 24 hours notification to the Nuclear Lab, to avoid a possible $50 charge to your account.    Follow-Up: At [facility], you and your health needs are our priority.  As part of our continuing mission to provide you with exceptional heart care, we have created designated Provider Care Teams.  These Care Teams include your primary Cardiologist (physician) and Advanced Practice Providers (APPs -  Physician Assistants and Nurse Practitioners) who all work together to provide you with the care you need, when you need it.  We recommend signing up for the patient portal called "MyChart".  Sign up information is provided on this After Visit Summary.  MyChart is used to connect with patients for Virtual Visits (Telemedicine).  Patients are able to view lab/test results, encounter notes, upcoming appointments, etc.  Non-urgent messages can be sent to your provider as well.   To learn more about what you can do with MyChart, go to [email or web address].    Your next appointment:   3 month(s)  The format for your next appointment:   In Person  Provider:   Berniece Salines, DO   Other Instructions

## 2020-02-04 NOTE — Progress Notes (Signed)
Cardiology Office Note:    Date:  02/04/2020   ID:  Eddie Davies, DOB 07/30/1970, MRN [id number]  PCP:  [name]  Cardiologist:  Berniece Salines, DO  Electrophysiologist:  None   Referring MD: [name], MD   " I am not sure why I am here"  History of Present Illness:    Eddie Davies is a 50 y.o. male with a hx of diabetes mellitus ESRD he is on hemodialysis Monday Wednesday Fridays, hypertension, vitamin D deficiency, the patient lives at [facility] and has been there for several years now.  The patient is a very poor historian he would not tell me why he was referred to see cardiology therefore our team had to call the nurse at [facility].  The nurse did report that the patient had chest pain several days ago which was described as sharp and left-sided.  He was taken to the emergency department due to low blood pressure we could not tolerate nitroglycerin and at that time he was evaluated and discharged back to [facility].  And asked to see cardiology.  The patient was seen at the [facility] emergency department on January 27, 2019.  During that visit he had high-sensitivity troponin x2 which were negative.  With his EKG not showing any evidence of myocardial infarction.  Once this was reviewed with the patient he could not remember the chest pain and tells me today he is not experiencing any chest pain.  Past Medical History:  Diagnosis Date  . Anxiety disorder, unspecified   . Cirrhosis ([place]) 04/2018  . Diabetes mellitus without complication ([place])    type II  . Enterocolitis due to Clostridium difficile, not specified as recurrent 08/09/2018   admission  . Epilepsy, unspecified, not intractable, without status epilepticus ([place])   . ESRD (end stage renal disease) ([place])    MWF   . Gout, unspecified   . Heart failure, unspecified ([place])   . Hypertension   . Klebsiella pneumoniae (k. pneumoniae) as the cause of diseases  classified elsewhere 06/25/2018   admission  . Liver failure ([place])   . Major depressive disorder, single episode, unspecified   . Metabolic acidosis 30/8657  . Pneumonia   . Polyneuropathy, unspecified   . Tobacco use   . Vitamin D deficiency     Past Surgical History:  Procedure Laterality Date  . APPENDECTOMY  1981  . AV FISTULA PLACEMENT Right 10/23/2018   Procedure: ARTERIOVENOUS (AV) FISTULA CREATION RIGHT ARM;  Surgeon: [name], MD;  Location: [place];  Service: Vascular;  Laterality: Right;  . CHOLECYSTECTOMY    . HERNIA REPAIR  2007  . IR FLUORO GUIDE CV LINE RIGHT  05/30/2018  . IR FLUORO GUIDE CV LINE RIGHT  03/18/2019  . IR FLUORO GUIDE CV LINE RIGHT  03/22/2019  . IR REMOVAL TUN CV CATH W/O FL  03/15/2019  . IR SINUS/FIST TUBE CHK-NON GI  04/04/2019  . IR US GUIDE VASC ACCESS RIGHT  05/30/2018  . IR US GUIDE VASC ACCESS RIGHT  03/18/2019  . IR US GUIDE VASC ACCESS RIGHT  03/22/2019  . NEPHRECTOMY Left 2020    Current Medications: Current Meds  Medication Sig  . acetaminophen (TYLENOL) 325 MG tablet Take 650 mg by mouth every 8 (eight) hours as needed for fever (pain).  Marland Kitchen allopurinol (ZYLOPRIM) 100 MG tablet Take 100 mg by mouth daily.   . Amino Acids-Protein Hydrolys (FEEDING SUPPLEMENT, PRO-STAT 64,) LIQD Take 30  mLs by mouth in the morning and at bedtime.  . calcium carbonate (OS-CAL - DOSED IN MG OF ELEMENTAL CALCIUM) 1250 (500 Ca) MG tablet Take by mouth.  . calcium carbonate (TUMS - DOSED IN MG ELEMENTAL CALCIUM) 500 MG chewable tablet Chew 2,000 mg by mouth as directed. Give 4 tablet (2000 mg) TID & Give 5 tablets (2500 mg) on MWF  . carbamazepine (TEGRETOL XR) 100 MG 12 hr tablet Take 100 mg by mouth 2 (two) times daily.  . carbamazepine (TEGRETOL XR) 400 MG 12 hr tablet Take 800 mg by mouth 2 (two) times daily.  . chlorhexidine (PERIDEX) 0.12 % solution Use as directed 15 mLs in the mouth or throat 2 (two) times daily.  . cyclobenzaprine (FLEXERIL) 5 MG tablet  Take 5 mg by mouth daily as needed for muscle spasms.  . DULoxetine (CYMBALTA) 20 MG capsule Take 20 mg by mouth daily.  Marland Kitchen guaiFENesin (MUCINEX) 600 MG 12 hr tablet Take 600 mg by mouth every 12 (twelve) hours as needed for cough (congestion).  Marland Kitchen HYDROcodone-acetaminophen (NORCO) 5-325 MG tablet Take 1 tablet by mouth every 6 (six) hours as needed for moderate pain.  Marland Kitchen insulin aspart (NOVOLOG) 100 UNIT/ML injection Inject 0-15 Units into the skin as directed. Inject 5 units daily with meals TID & Inject TID Per Sliding Scale with meals If 121-200=2 units, 201-250=5 units, 251-300=8 units, 301-350=11 units, 351-400=15 units >400 call MD  . Insulin Glargine (BASAGLAR KWIKPEN) 100 UNIT/ML Inject 35 Units into the skin at bedtime.  . Ipratropium-Albuterol (COMBIVENT) 20-100 MCG/ACT AERS respimat Inhale 1 puff into the lungs every 6 (six) hours as needed for wheezing.  Marland Kitchen ketoconazole (NIZORAL) 2 % shampoo Apply 1 application topically See admin instructions. APPLY TO SCALP (SHAMPOO) TOPICALLY EVERY DAY SHIFT EVERY TUE, FRI, SUN FOR DRY SCALP (BATH DAYS) APPLY TO SCALP 3X A WEEK & RINSE.  Marland Kitchen lactulose (CHRONULAC) 10 GM/15ML solution Take 15 mLs (10 g total) by mouth 2 (two) times daily.  . Lidocaine 3 % CREA Apply 1 application topically 3 (three) times a week. MWF  . lidocaine-prilocaine (EMLA) cream Apply 1 application topically every Monday, Wednesday, and Friday.  . Methoxy PEG-Epoetin Beta (MIRCERA IJ) Mircera  . Multiple Vitamin (MULTIVITAMIN WITH MINERALS) TABS tablet Take 1 tablet by mouth at bedtime.  . Nutritional Supplements (NEPRO PO) Take 1 Bottle by mouth at bedtime.  Marland Kitchen omeprazole (PRILOSEC) 20 MG capsule Take 20 mg by mouth daily.   . ondansetron (ZOFRAN) 4 MG tablet Take 4 mg by mouth every 8 (eight) hours as needed for nausea or vomiting.   . penicillin v potassium (VEETID) 500 MG tablet Take 500 mg by mouth 4 (four) times daily.  Marland Kitchen PHENobarbital (LUMINAL) 64.8 MG tablet Take 259.2 mg by  mouth at bedtime. Take 4 tablets (259.20 mg) at bedtime  . polyethylene glycol (MIRALAX / GLYCOLAX) 17 g packet Take 17 g by mouth daily as needed. (Patient taking differently: Take 17 g by mouth daily as needed for moderate constipation.)  . promethazine (PHENERGAN) 25 MG/ML injection Inject 25 mg into the muscle every 6 (six) hours as needed for nausea or vomiting.  . simvastatin (ZOCOR) 20 MG tablet Take 20 mg by mouth at bedtime.   . tamsulosin (FLOMAX) 0.4 MG CAPS capsule Take 0.4 mg by mouth at bedtime.  . TRADJENTA 5 MG TABS tablet Take 5 mg by mouth daily.     Allergies:   Fish oil and Ethyl acetate   Social History  Socioeconomic History  . Marital status: Single    Spouse name: Not on file  . Number of children: 0  . Years of education: Not on file  . Highest education level: High school graduate  Occupational History  . Not on file  Tobacco Use  . Smoking status: Current Some Day Smoker    Packs/day: 0.25    Types: Cigarettes  . Smokeless tobacco: Never Used  Vaping Use  . Vaping Use: Never used  Substance and Sexual Activity  . Alcohol use: Not Currently  . Drug use: Never  . Sexual activity: Not on file  Other Topics Concern  . Not on file  Social History Narrative   Lives at [facility]   Right handed   Caffeine: "none hardly"   Social Determinants of Health   Financial Resource Strain: Not on file  Food Insecurity: Not on file  Transportation Needs: Not on file  Physical Activity: Not on file  Stress: Not on file  Social Connections: Not on file     Family History: The patient's family history is negative for Seizures.  ROS:   Review of Systems  Constitution: Negative for decreased appetite, fever and weight gain.  HENT: Negative for congestion, ear discharge, hoarse voice and sore throat.   Eyes: Negative for discharge, redness, vision loss in right eye and visual halos.  Cardiovascular: Negative for chest pain, dyspnea on exertion,  leg swelling, orthopnea and palpitations.  Respiratory: Negative for cough, hemoptysis, shortness of breath and snoring.   Endocrine: Negative for heat intolerance and polyphagia.  Hematologic/Lymphatic: Negative for bleeding problem. Does not bruise/bleed easily.  Skin: Negative for flushing, nail changes, rash and suspicious lesions.  Musculoskeletal: Negative for arthritis, joint pain, muscle cramps, myalgias, neck pain and stiffness.  Gastrointestinal: Negative for abdominal pain, bowel incontinence, diarrhea and excessive appetite.  Genitourinary: Negative for decreased libido, genital sores and incomplete emptying.  Neurological: Negative for brief paralysis, focal weakness, headaches and loss of balance.  Psychiatric/Behavioral: Negative for altered mental status, depression and suicidal ideas.  Allergic/Immunologic: Negative for HIV exposure and persistent infections.    EKGs/Labs/Other Studies Reviewed:    The following studies were reviewed today:   EKG:  The ekg ordered today demonstrates sinus rhythm, heart rate 72 bpm with right-sided interventricular conduction defect.  Recent Labs: 03/23/2019: Magnesium 2.1 08/26/2019: ALT 13 01/27/2020: BUN 57; Creatinine, Ser 10.56; Hemoglobin 8.9; Platelets 122; Potassium 4.4; Sodium 128  Recent Lipid Panel    Component Value Date/Time   CHOL 76 05/24/2018 0528   TRIG 123 05/24/2018 0528   HDL 35 (L) 05/24/2018 0528   CHOLHDL 2.2 05/24/2018 0528   VLDL 25 05/24/2018 0528   LDLCALC 16 05/24/2018 0528    Physical Exam:    VS:  BP 104/62 (BP Location: Left Arm, Patient Position: Sitting)   Pulse 72   Ht 6' 2"  (1.88 m)   Wt 244 lb 9.6 oz (110.9 kg)   BMI 31.40 kg/m     Wt Readings from Last 3 Encounters:  02/04/20 244 lb 9.6 oz (110.9 kg)  08/26/19 (!) 212 lb 8.4 oz (96.4 kg)  04/18/19 197 lb 15.6 oz (89.8 kg)     GEN: Well nourished, well developed in no acute distress HEENT: Normal NECK: No JVD; No carotid  bruits LYMPHATICS: No lymphadenopathy CARDIAC: S1S2 noted,RRR, no murmurs, rubs, gallops RESPIRATORY:  Clear to auscultation without rales, wheezing or rhonchi  ABDOMEN: Soft, non-tender, non-distended, +bowel sounds, no guarding. EXTREMITIES: No edema, No cyanosis,  no clubbing MUSCULOSKELETAL:  No deformity  SKIN: Warm and dry NEUROLOGIC:  Alert and oriented x 3, non-focal PSYCHIATRIC:  Normal affect, good insight  ASSESSMENT:    1. Hypertension, unspecified type   2. Chest pain of uncertain etiology   3. Benign hypertension with chronic kidney disease, stage III ([place])   4. Benign essential HTN   5. Type 2 diabetes mellitus with unspecified complications ([place])   6. Precordial pain    PLAN:      He has multiple risk factors for coronary artery disease I like to proceed an ischemic evaluation in this patient we will discuss pharmacologic nuclear stress test will be ordered.  Discussed with the patient about the testing he is agreeable to proceed with this.  His EKG showed sinus rhythm with intermittent accelerated junctional and right-sided interventricular conduction defect we will continue to monitor this.  He has no lightheadedness no dizziness.  He missed dialysis as well we will continue to monitor.  This is being managed by his primary care doctor.  No adjustments for antidiabetic medications were made today.  The patient is in agreement with the above plan. The patient left the office in stable condition.  The patient will follow up in 3 months or sooner if needed.   Medication Adjustments/Labs and Tests Ordered: Current medicines are reviewed at length with the patient today.  Concerns regarding medicines are outlined above.  Orders Placed This Encounter  Procedures  . Basic metabolic panel  . CBC  . Magnesium  . MYOCARDIAL PERFUSION IMAGING  . EKG 12-Lead   No orders of the defined types were placed in this encounter.   Patient Instructions  Medication  Instructions:  Your physician recommends that you continue on your current medications as directed. Please refer to the Current Medication list given to you today.  *If you need a refill on your cardiac medications before your next appointment, please call your pharmacy*   Lab Work: Your physician recommends that you return for lab work in: [place], CBC If you have labs (blood work) drawn today and your tests are completely normal, you will receive your results only by: Marland Kitchen MyChart Message (if you have MyChart) OR . A paper copy in the mail If you have any lab test that is abnormal or we need to change your treatment, we will call you to review the results.   Testing/Procedures:   [facility] [address] [place], [place] [address] Phone:  [phone]    Please arrive 15 minutes prior to your appointment time for registration and insurance purposes.  The test will take approximately 3 to 4 hours to complete; you may bring reading material.  If someone comes with you to your appointment, they will need to remain in the main lobby due to limited space in the testing area. **If you are pregnant or breastfeeding, please notify the nuclear lab prior to your appointment**  How to prepare for your Myocardial Perfusion Test: . Do not eat or drink 3 hours prior to your test, except you may have water. . Do not consume products containing caffeine (regular or decaffeinated) 12 hours prior to your test. (ex: coffee, chocolate, sodas, tea). . Do bring a list of your current medications with you.  If not listed below, you may take your medications as normal. . HOLD diabetic medication the morning of the test: Tradjenta. Take half of long acting insulin the night before test. . Do wear comfortable clothes (  no dresses or overalls) and walking shoes, tennis shoes preferred (No heels or open toe shoes are allowed). . Do NOT wear cologne, perfume, aftershave, or  lotions (deodorant is allowed). . If these instructions are not followed, your test will have to be rescheduled.  Please report to [address] for your test.  If you have questions or concerns about your appointment, you can call the [facility] at [phone].  If you cannot keep your appointment, please provide 24 hours notification to the Nuclear Lab, to avoid a possible $50 charge to your account.    Follow-Up: At [facility], you and your health needs are our priority.  As part of our continuing mission to provide you with exceptional heart care, we have created designated Provider Care Teams.  These Care Teams include your primary Cardiologist (physician) and Advanced Practice Providers (APPs -  Physician Assistants and Nurse Practitioners) who all work together to provide you with the care you need, when you need it.  We recommend signing up for the patient portal called "MyChart".  Sign up information is provided on this After Visit Summary.  MyChart is used to connect with patients for Virtual Visits (Telemedicine).  Patients are able to view lab/test results, encounter notes, upcoming appointments, etc.  Non-urgent messages can be sent to your provider as well.   To learn more about what you can do with MyChart, go to [email or web address].    Your next appointment:   3 month(s)  The format for your next appointment:   In Person  Provider:   Berniece Salines, DO   Other Instructions      Adopting a Healthy Lifestyle.  Know what a healthy weight is for you (roughly BMI <25) and aim to maintain this   Aim for 7+ servings of fruits and vegetables daily   65-80+ fluid ounces of water or unsweet tea for healthy kidneys   Limit to max 1 drink of alcohol per day; avoid smoking/tobacco   Limit animal fats in diet for cholesterol and heart health - choose grass fed whenever available   Avoid highly processed foods, and foods high in  saturated/trans fats   Aim for low stress - take time to unwind and care for your mental health   Aim for 150 min of moderate intensity exercise weekly for heart health, and weights twice weekly for bone health   Aim for 7-9 hours of sleep daily   When it comes to diets, agreement about the perfect plan isnt easy to find, even among the experts. Experts at [employer] developed an idea known as the Healthy Eating Plate. Just imagine a plate divided into logical, healthy portions.   The emphasis is on diet quality:   Load up on vegetables and fruits - one-half of your plate: Aim for color and variety, and remember that potatoes dont count.   Go for whole grains - one-quarter of your plate: Whole wheat, barley, wheat berries, quinoa, oats, brown rice, and foods made with them. If you want pasta, go with whole wheat pasta.   Protein power - one-quarter of your plate: Fish, chicken, beans, and nuts are all healthy, versatile protein sources. Limit red meat.   The diet, however, does go beyond the plate, offering a few other suggestions.   Use healthy plant oils, such as olive, canola, soy, corn, sunflower and peanut. Check the labels, and avoid partially hydrogenated oil, which have unhealthy trans fats.  If youre thirsty, drink water. Coffee and tea are good in moderation, but skip sugary drinks and limit milk and dairy products to one or two daily servings.   The type of carbohydrate in the diet is more important than the amount. Some sources of carbohydrates, such as vegetables, fruits, whole grains, and beans-are healthier than others.   Finally, stay active  Signed, Berniece Salines, DO  02/04/2020 9:46 AM    Eddie Davies

## 2020-02-04 NOTE — Progress Notes (Signed)
EK

## 2020-02-05 ENCOUNTER — Telehealth: Payer: Self-pay

## 2020-02-05 LAB — BASIC METABOLIC PANEL
BUN/Creatinine Ratio: 5 — ABNORMAL LOW (ref 9–20)
BUN: 50 mg/dL — ABNORMAL HIGH (ref 6–24)
CO2: 13 mmol/L — ABNORMAL LOW (ref 20–29)
Calcium: 7.3 mg/dL — ABNORMAL LOW (ref 8.7–10.2)
Chloride: 99 mmol/L (ref 96–106)
Creatinine, Ser: 9.26 mg/dL — ABNORMAL HIGH (ref 0.76–1.27)
GFR calc Af Amer: 7 mL/min/{1.73_m2} — ABNORMAL LOW (ref >59)
GFR calc non Af Amer: 6 mL/min/{1.73_m2} — ABNORMAL LOW (ref >59)
Glucose: 358 mg/dL — ABNORMAL HIGH (ref 65–99)
Potassium: 4.2 mmol/L (ref 3.5–5.2)
Sodium: 135 mmol/L (ref 134–144)

## 2020-02-05 LAB — CBC
Hematocrit: 22.2 % — ABNORMAL LOW (ref 37.5–51.0)
Hemoglobin: 7.8 g/dL — ABNORMAL LOW (ref 13.0–17.7)
MCH: 35.3 pg — ABNORMAL HIGH (ref 26.6–33.0)
MCHC: 35.1 g/dL (ref 31.5–35.7)
MCV: 101 fL — ABNORMAL HIGH (ref 79–97)
Platelets: 100 10*3/uL — CL (ref 150–450)
RBC: 2.21 x10E6/uL — CL (ref 4.14–5.80)
RDW: 14 % (ref 11.6–15.4)
WBC: 5 10*3/uL (ref 3.4–10.8)

## 2020-02-05 LAB — MAGNESIUM: Magnesium: 2.6 mg/dL — ABNORMAL HIGH (ref 1.6–2.3)

## 2020-02-05 NOTE — Telephone Encounter (Signed)
Spoke with the patient's nurse, at the nursing facility where the patient lives. Detailed instructions given. Asked to call back with any questions. S.Bernetta Sutley EMTP

## 2020-02-12 ENCOUNTER — Telehealth (HOSPITAL_COMMUNITY): Payer: Self-pay | Admitting: *Deleted

## 2020-02-12 NOTE — Telephone Encounter (Signed)
Left message on voicemail in reference to upcoming appointment scheduled for 02/18/2020. Phone number given for a call back so details instructions can be given. Eddie Davies

## 2020-02-14 ENCOUNTER — Telehealth: Payer: Self-pay | Admitting: Cardiology

## 2020-02-14 NOTE — Telephone Encounter (Signed)
Spoke to [facility] and they would like the instructions prior the [place] faxed to their office at [phone]

## 2020-02-14 NOTE — Telephone Encounter (Signed)
Your physician has requested that you have a lexiscan myoview. For further information please visit [email or web address]. Please follow instruction sheet, as given.  The test will take approximately 3 to 4 hours to complete; you may bring reading material.  If someone comes with you to your appointment, they will need to remain in the main lobby due to limited space in the testing area.   How to prepare for your Myocardial Perfusion Test: . Do not eat or drink 3 hours prior to your test, except you may have water. . Do not consume products containing caffeine (regular or decaffeinated) 12 hours prior to your test. (ex: coffee, chocolate, sodas, tea). . Do bring a list of your current medications with you.  If not listed below, you may take your medications as normal. . Do wear comfortable clothes (no dresses or overalls) and walking shoes, tennis shoes preferred (No heels or open toe shoes are allowed). . Do NOT wear cologne, perfume, aftershave, or lotions (deodorant is allowed). . If these instructions are not followed, your test will have to be rescheduled.  Instructions printed and faxed to [facility]

## 2020-03-03 ENCOUNTER — Telehealth (HOSPITAL_COMMUNITY): Payer: Self-pay | Admitting: *Deleted

## 2020-03-03 NOTE — Telephone Encounter (Signed)
Patient's nurse given detailed instructions per Myocardial Perfusion Study Information Sheet for the test on 03/10/20. Patient notified to arrive 15 minutes early and that it is imperative to arrive on time for appointment to keep from having the test rescheduled.  If you need to cancel or reschedule your appointment, please call the office within 24 hours of your appointment. . Patient verbalized understanding.Kirstie Peri

## 2020-03-10 ENCOUNTER — Other Ambulatory Visit: Payer: Self-pay

## 2020-03-10 ENCOUNTER — Ambulatory Visit (INDEPENDENT_AMBULATORY_CARE_PROVIDER_SITE_OTHER): Payer: Medicare Other

## 2020-03-10 DIAGNOSIS — R079 Chest pain, unspecified: Secondary | ICD-10-CM | POA: Diagnosis not present

## 2020-03-10 MED ORDER — ADENOSINE (DIAGNOSTIC) 3 MG/ML IV SOLN
0.5600 mg/kg | Freq: Once | INTRAVENOUS | Status: AC
  Administered 2020-03-10: 60 mg via INTRAVENOUS

## 2020-03-10 MED ORDER — TECHNETIUM TC 99M TETROFOSMIN IV KIT
31.0000 | PACK | Freq: Once | INTRAVENOUS | Status: AC | PRN
  Administered 2020-03-10: 31 via INTRAVENOUS

## 2020-03-10 MED ORDER — TECHNETIUM TC 99M TETROFOSMIN IV KIT
11.0000 | PACK | Freq: Once | INTRAVENOUS | Status: AC | PRN
  Administered 2020-03-10: 11 via INTRAVENOUS

## 2020-03-12 ENCOUNTER — Telehealth: Payer: Self-pay

## 2020-03-12 LAB — MYOCARDIAL PERFUSION IMAGING
LV dias vol: 168 mL (ref 62–150)
LV sys vol: 64 mL
Peak HR: 85 {beats}/min
Rest HR: 64 {beats}/min
SDS: 1
SRS: 0
SSS: 1
TID: 1.11

## 2020-03-12 NOTE — Telephone Encounter (Signed)
-----   Message from Berniece Salines, DO sent at 03/12/2020 11:20 AM EST ----- Doristine Devoid news, stress test normal

## 2020-03-12 NOTE — Telephone Encounter (Signed)
Spoke with patients nurse regarding results and recommendation.  She verbalizes understanding and is agreeable to plan of care. Advised for her or the patient to call back with any issues or concerns.

## 2020-03-31 ENCOUNTER — Encounter: Payer: Self-pay | Admitting: Sports Medicine

## 2020-03-31 ENCOUNTER — Ambulatory Visit (INDEPENDENT_AMBULATORY_CARE_PROVIDER_SITE_OTHER): Payer: Medicare Other | Admitting: Sports Medicine

## 2020-03-31 ENCOUNTER — Other Ambulatory Visit: Payer: Self-pay

## 2020-03-31 DIAGNOSIS — M79672 Pain in left foot: Secondary | ICD-10-CM

## 2020-03-31 DIAGNOSIS — L853 Xerosis cutis: Secondary | ICD-10-CM

## 2020-03-31 DIAGNOSIS — B351 Tinea unguium: Secondary | ICD-10-CM

## 2020-03-31 DIAGNOSIS — M79675 Pain in left toe(s): Secondary | ICD-10-CM | POA: Diagnosis not present

## 2020-03-31 DIAGNOSIS — M79671 Pain in right foot: Secondary | ICD-10-CM

## 2020-03-31 DIAGNOSIS — M79674 Pain in right toe(s): Secondary | ICD-10-CM

## 2020-03-31 NOTE — Progress Notes (Signed)
Subjective: Eddie Davies is a 50 y.o. male patient with history of diabetes who presents to office today complaining of long,mildly painful nails; unable to trim. Patient states that the glucose reading this morning was not recorded but after lunch was 93 mg/dl as recorded by [facility] and last A1c 6.8.  Saw PCP Dr. Junius Argyle 1 month ago.    Patient Active Problem List   Diagnosis Date Noted  . Tobacco use   . Polyneuropathy, unspecified   . Pneumonia   . Major depressive disorder, single episode, unspecified   . Liver failure ([place])   . Heart failure, unspecified ([place])   . Gout, unspecified   . ESRD (end stage renal disease) ([place])   . Epilepsy, unspecified, not intractable, without status epilepticus ([place])   . Anxiety disorder, unspecified   . Chest pain, unspecified 01/27/2020  . Other fluid overload 04/10/2019  . Allergy, unspecified, initial encounter 04/06/2019  . Retroperitoneal abscess ([place]) 03/13/2019  . Infection due to ESBL-producing Klebsiella pneumoniae 03/13/2019  . Personal history of COVID-19 02/25/2019  . Postprocedural hematoma of a genitourinary system organ or structure following a genitourinary system procedure 02/14/2019  . Coagulopathy ([place]) 02/06/2019  . Failure to thrive in adult 01/21/2019  . 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 Protein-calorie malnutrition, severe ([place]) 01/21/2019  . End stage liver disease ([place]) 01/09/2019  . COVID-19 virus infection 01/09/2019  . History of ESBL Klebsiella pneumoniae infection 01/09/2019  . Septic shock ([place]) 01/09/2019  . ESRD on hemodialysis ([place]) 12/27/2018  . Gram negative sepsis ([place]) 12/27/2018  . History of Clostridioides difficile colitis 12/27/2018  . Anemia in chronic kidney disease 08/28/2018  . Secondary hyperparathyroidism of renal origin ([place]) 08/28/2018  . Enterocolitis due to Clostridium difficile, not specified as recurrent 08/09/2018  . Pain, unspecified 07/09/2018  .  Klebsiella pneumoniae (k. pneumoniae) as the cause of diseases classified elsewhere 06/25/2018  . Bacteremia due to Klebsiella pneumoniae 06/19/2018  . Fever 06/18/2018  . Anemia in ESRD (end-stage renal disease) ([place]) 06/18/2018  . Anemia due to chronic kidney disease 06/18/2018  . Leukocytosis 06/18/2018  . Diarrhea, unspecified 06/08/2018  . Dyspnea, unspecified 06/01/2018  . Encounter for screening for respiratory tuberculosis 06/01/2018  . Other cirrhosis of liver ([place]) 06/01/2018  . Seizures ([place]) 05/24/2018  . Benign prostatic hyperplasia without lower urinary tract symptoms 05/24/2018  . Sepsis ([place]) 05/24/2018  . Benign essential HTN 05/24/2018  . Thrombocytopenia ([place]) 05/24/2018  . Metabolic acidosis 67/61/9509  . Metabolic bone disease 32/67/1245  . Iron deficiency anemia 05/11/2018  . Vitamin D deficiency 05/11/2018  . Cirrhosis ([place]) 04/2018  . Respiratory failure ([place])   . Pressure injury of skin 04/21/2018  . Altered mental status, unspecified 04/21/2018  . HCAP (healthcare-associated pneumonia) 04/21/2018  . Anemia due to chronic kidney disease, on chronic dialysis ([place]) 04/07/2018  . Closed fracture of left upper extremity with routine healing 03/20/2018  . Difficult intravenous access 03/16/2018  . Delirium 03/14/2018  . Hyperammonemia ([place]) 03/14/2018  . Hypocalcemia 03/08/2018  . Urinary calculus, unspecified 03/08/2018  . Physical deconditioning 03/08/2018  . Pressure injury of lower back, stage 2 ([place]) 03/08/2018  . HTN (hypertension) 03/08/2018  . Other hypoglycemia 03/08/2018  . Closed intertrochanteric fracture of left femur ([place]) 01/21/2018  . Fall 01/21/2018  . Fracture of left ulna 01/21/2018  . Fracture of left tibia 01/21/2018  . Trauma 01/21/2018  . Diabetic ulcer of toe associated with type 2 diabetes mellitus, limited to breakdown of skin ([place])  02/14/2017  . Hyperosmolar non-ketotic state in patient with type 2 diabetes mellitus ([place])  02/14/2017  . Anemia 12/08/2016  . Diabetic ketoacidosis ([place]) 12/08/2016  . GERD (gastroesophageal reflux disease) 12/08/2016  . Hyponatremia 12/08/2016  . 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  . Open wound of left foot except toes with complication 22/29/7989  . Hyperlipidemia 06/22/2016  . Type 2 diabetes mellitus with unspecified complications ([place]) 21/19/4174  . Benign hypertension with chronic kidney disease, stage III ([place]) 06/22/2016  . Abscess of scrotum 09/19/2014  . Cellulitis of toe of right foot 09/19/2014  . Axillary abscess 02/03/2014  . Hypokalemia 02/03/2014   Current Outpatient Medications on File Prior to Visit  Medication Sig Dispense Refill  . aspirin (BAYER LOW DOSE) 81 MG EC tablet Take by mouth.    . Cholecalciferol 125 MCG (5000 UT) TABS Take by mouth.    . cyanocobalamin 1000 MCG tablet Take by mouth.    Marland Kitchen LOPERAMIDE HCL PO Take by mouth.    Marland Kitchen acetaminophen (TYLENOL) 325 MG tablet Take 650 mg by mouth every 8 (eight) hours as needed for fever (pain).    Marland Kitchen allopurinol (ZYLOPRIM) 100 MG tablet Take 100 mg by mouth daily.     . Amino Acids-Protein Hydrolys (FEEDING SUPPLEMENT, PRO-STAT 64,) LIQD Take 30 mLs by mouth in the morning and at bedtime.    . calcium carbonate (OS-CAL - DOSED IN MG OF ELEMENTAL CALCIUM) 1250 (500 Ca) MG tablet Take by mouth.    . calcium carbonate (TUMS - DOSED IN MG ELEMENTAL CALCIUM) 500 MG chewable tablet Chew 2,000 mg by mouth as directed. Give 4 tablet (2000 mg) TID & Give 5 tablets (2500 mg) on MWF    . carbamazepine (TEGRETOL XR) 100 MG 12 hr tablet Take 100 mg by mouth 2 (two) times daily.    . carbamazepine (TEGRETOL XR) 400 MG 12 hr tablet Take 800 mg by mouth 2 (two) times daily.    . chlorhexidine (PERIDEX) 0.12 % solution Use as directed 15 mLs in the mouth or throat 2 (two) times daily.    . cyclobenzaprine (FLEXERIL) 5 MG tablet Take 5 mg by mouth daily as needed for muscle spasms.    . DULoxetine (CYMBALTA) 20 MG capsule Take 20  mg by mouth daily.    Marland Kitchen guaiFENesin (MUCINEX) 600 MG 12 hr tablet Take 600 mg by mouth every 12 (twelve) hours as needed for cough (congestion).    Marland Kitchen HYDROcodone-acetaminophen (NORCO) 5-325 MG tablet Take 1 tablet by mouth every 6 (six) hours as needed for moderate pain. 30 tablet 0  . insulin aspart (NOVOLOG) 100 UNIT/ML injection Inject 0-15 Units into the skin as directed. Inject 5 units daily with meals TID & Inject TID Per Sliding Scale with meals If 121-200=2 units, 201-250=5 units, 251-300=8 units, 301-350=11 units, 351-400=15 units >400 call MD    . Insulin Glargine (BASAGLAR KWIKPEN) 100 UNIT/ML SOPN Inject 0.28 mLs (28 Units total) into the skin at bedtime. (Patient taking differently: Inject 35 Units into the skin at bedtime.) 8.4 mL 0  . Insulin Glargine (BASAGLAR KWIKPEN) 100 UNIT/ML Inject 35 Units into the skin at bedtime.    . Ipratropium-Albuterol (COMBIVENT) 20-100 MCG/ACT AERS respimat Inhale 1 puff into the lungs every 6 (six) hours as needed for wheezing.    Marland Kitchen ketoconazole (NIZORAL) 2 % shampoo Apply 1 application topically See admin instructions. APPLY TO SCALP (SHAMPOO) TOPICALLY EVERY DAY SHIFT EVERY TUE, FRI, SUN FOR DRY SCALP (BATH DAYS) APPLY TO SCALP 3X A  WEEK & RINSE.    Marland Kitchen lactulose (CHRONULAC) 10 GM/15ML solution Take 15 mLs (10 g total) by mouth 2 (two) times daily. 236 mL 0  . Lidocaine 3 % CREA Apply 1 application topically 3 (three) times a week. MWF    . lidocaine-prilocaine (EMLA) cream Apply 1 application topically every Monday, Wednesday, and Friday.    . Methoxy PEG-Epoetin Beta (MIRCERA IJ) Mircera    . Multiple Vitamin (MULTIVITAMIN WITH MINERALS) TABS tablet Take 1 tablet by mouth at bedtime.    . Nutritional Supplements (NEPRO PO) Take 1 Bottle by mouth at bedtime.    Marland Kitchen omeprazole (PRILOSEC) 20 MG capsule Take 20 mg by mouth daily.     . ondansetron (ZOFRAN) 4 MG tablet Take 4 mg by mouth every 8 (eight) hours as needed for nausea or vomiting.     .  penicillin v potassium (VEETID) 500 MG tablet Take 500 mg by mouth 4 (four) times daily.    Marland Kitchen PHENobarbital (LUMINAL) 64.8 MG tablet Take 4 tablets (259.2 mg total) by mouth at bedtime. 120 tablet 0  . PHENobarbital (LUMINAL) 64.8 MG tablet Take 259.2 mg by mouth at bedtime. Take 4 tablets (259.20 mg) at bedtime    . polyethylene glycol (MIRALAX / GLYCOLAX) 17 g packet Take 17 g by mouth daily as needed. (Patient taking differently: Take 17 g by mouth daily as needed for moderate constipation.) 14 each 0  . promethazine (PHENERGAN) 25 MG/ML injection Inject 25 mg into the muscle every 6 (six) hours as needed for nausea or vomiting.    . simvastatin (ZOCOR) 20 MG tablet Take 20 mg by mouth at bedtime.   3  . tamsulosin (FLOMAX) 0.4 MG CAPS capsule Take 0.4 mg by mouth at bedtime.    . TRADJENTA 5 MG TABS tablet Take 5 mg by mouth daily.     No current facility-administered medications on file prior to visit.   Allergies  Allergen Reactions  . Fish Oil Swelling and Other (See Comments)       . Ethyl Acetate     Recent Results (from the past 2160 hour(s))  Basic metabolic panel     Status: Abnormal   Collection Time: 01/27/20  9:27 AM  Result Value Ref Range   Sodium 128 (L) 135 - 145 mmol/L   Potassium 4.4 3.5 - 5.1 mmol/L   Chloride 94 (L) 98 - 111 mmol/L   CO2 16 (L) 22 - 32 mmol/L   Glucose, Bld 268 (H) 70 - 99 mg/dL    Comment: Glucose reference range applies only to samples taken after fasting for at least 8 hours.   BUN 57 (H) 6 - 20 mg/dL   Creatinine, Ser 10.56 (H) 0.61 - 1.24 mg/dL   Calcium 7.0 (L) 8.9 - 10.3 mg/dL   GFR, Estimated 5 (L) >60 mL/min    Comment: (NOTE) Calculated using the CKD-EPI Creatinine Equation (2021)    Anion gap 18 (H) 5 - 15    Comment: Performed at [facility] [address], [place], [place] [address]  CBC     Status: Abnormal   Collection Time: 01/27/20  9:27 AM  Result Value Ref Range   WBC 4.7 4.0 - 10.5 K/uL   RBC 2.37 (L) 4.22 -  5.81 MIL/uL   Hemoglobin 8.9 (L) 13.0 - 17.0 g/dL   HCT 25.1 (L) 39.0 - 52.0 %   MCV 105.9 (H) 80.0 - 100.0 fL   MCH 37.6 (H) 26.0 - 34.0  pg   MCHC 35.5 30.0 - 36.0 g/dL   RDW 15.9 (H) 11.5 - 15.5 %   Platelets 122 (L) 150 - 400 K/uL   nRBC 0.4 (H) 0.0 - 0.2 %    Comment: Performed at [facility] [address], [place], [place] [address]  Troponin I (High Sensitivity)     Status: None   Collection Time: 01/27/20  9:27 AM  Result Value Ref Range   Troponin I (High Sensitivity) 5 <18 ng/L    Comment: (NOTE) Elevated high sensitivity troponin I (hsTnI) values and significant  changes across serial measurements may suggest ACS but many other  chronic and acute conditions are known to elevate hsTnI results.  Refer to the "Links" section for chest pain algorithms and additional  guidance. Performed at [facility], [place] [address], [place], [place] [address]   Troponin I (High Sensitivity)     Status: None   Collection Time: 01/27/20  4:42 PM  Result Value Ref Range   Troponin I (High Sensitivity) 4 <18 ng/L    Comment: (NOTE) Elevated high sensitivity troponin I (hsTnI) values and significant  changes across serial measurements may suggest ACS but many other  chronic and acute conditions are known to elevate hsTnI results.  Refer to the "Links" section for chest pain algorithms and additional  guidance. Performed at [facility], [place] [address] [place], [place] [address]   Basic metabolic panel     Status: Abnormal   Collection Time: 02/04/20  9:37 AM  Result Value Ref Range   Glucose 358 (H) 65 - 99 mg/dL   BUN 50 (H) 6 - 24 mg/dL   Creatinine, Ser 9.26 (H) 0.76 - 1.27 mg/dL   GFR calc non Af Amer 6 (L) >59 mL/min/1.73   GFR calc Af Amer 7 (L) >59 mL/min/1.73    Comment: **In accordance with recommendations from the NKF-ASN Task force,**   [facility] is in the process of updating its eGFR calculation to the   2021 CKD-EPI creatinine equation that estimates  kidney function   without a race variable.    BUN/Creatinine Ratio 5 (L) 9 - 20   Sodium 135 134 - 144 mmol/L   Potassium 4.2 3.5 - 5.2 mmol/L   Chloride 99 96 - 106 mmol/L   CO2 13 (L) 20 - 29 mmol/L    Comment: **Verified by repeat analysis**   Calcium 7.3 (L) 8.7 - 10.2 mg/dL  CBC     Status: Abnormal   Collection Time: 02/04/20  9:37 AM  Result Value Ref Range   WBC 5.0 3.4 - 10.8 x10E3/uL   RBC 2.21 (LL) 4.14 - 5.80 x10E6/uL   Hemoglobin 7.8 (L) 13.0 - 17.7 g/dL   Hematocrit 22.2 (L) 37.5 - 51.0 %   MCV 101 (H) 79 - 97 fL   MCH 35.3 (H) 26.6 - 33.0 pg   MCHC 35.1 31.5 - 35.7 g/dL   RDW 14.0 11.6 - 15.4 %   Platelets 100 (LL) 150 - 450 x10E3/uL  Magnesium     Status: Abnormal   Collection Time: 02/04/20  9:37 AM  Result Value Ref Range   Magnesium 2.6 (H) 1.6 - 2.3 mg/dL  MYOCARDIAL PERFUSION IMAGING     Status: None   Collection Time: 03/10/20 11:25 AM  Result Value Ref Range   Rest HR 64 bpm   Rest BP 102/62 mmHg   Peak HR 85 bpm   Peak BP 128/70 mmHg  SSS 1    SRS 0    SDS 1    TID 1.11    LV sys vol 64 mL   LV dias vol 168 62 - 150 mL    Objective: General: Patient is awake, alert, and oriented x 3 and in no acute distress.  Integument: Skin is warm, dry and supple bilateral. Nails are tender, long, thickened and  dystrophic with subungual debris, consistent with onychomycosis, 1-5 bilateral worse at left 2nd toenail. No acute signs of infection. +Minimal callus sub met 1 on right.  No open lesions present bilateral. Severe dry skin with fish scale appearance to both legs. Remaining integument unremarkable.  Vasculature:  Dorsalis Pedis pulse 1/4 bilateral. Posterior Tibial pulse  0/4 bilateral.  Capillary fill time <3 sec 1-5 bilateral. No hair growth to the level of the digits. Temperature gradient within normal limits. No varicosities present bilateral. No edema present bilateral.   Neurology: Gross sensation present via light touch  bilateral.  Musculoskeletal: Asymptomatic pes planus pedal deformities noted bilateral. Muscular strength 5/5 in all lower extremity muscular groups bilateral without pain on range of motion . No tenderness with calf compression bilateral.  Assessment and Plan: Problem List Items Addressed This Visit   None   Visit Diagnoses    Pain due to onychomycosis of toenails of both feet    -  Primary   Xerosis of skin       Foot pain, bilateral           -Examined patient. -Discussed and educated patient on diabetic foot care, especially with  regards to the vascular, neurological and musculoskeletal systems.  -Mechanically debrided all nails 1-5 bilateral using sterile nail nipper and filed with dremel without incident  -At no additional charge debrided callus x1 on right using chisel blade without incident -Recommend continue with skin cream for dry skin -Patient to return  in 3 months for at risk foot care -Patient advised to call the office if any problems or questions arise in the meantime.  Landis Martins, DPM

## 2020-05-04 DIAGNOSIS — I10 Essential (primary) hypertension: Secondary | ICD-10-CM | POA: Insufficient documentation

## 2020-05-04 DIAGNOSIS — E119 Type 2 diabetes mellitus without complications: Secondary | ICD-10-CM | POA: Insufficient documentation

## 2020-05-05 ENCOUNTER — Other Ambulatory Visit: Payer: Self-pay

## 2020-05-05 ENCOUNTER — Ambulatory Visit (INDEPENDENT_AMBULATORY_CARE_PROVIDER_SITE_OTHER): Payer: Medicare Other | Admitting: Cardiology

## 2020-05-05 ENCOUNTER — Encounter: Payer: Self-pay | Admitting: Cardiology

## 2020-05-05 VITALS — BP 118/64 | HR 82 | Ht 74.0 in | Wt 228.3 lb

## 2020-05-05 DIAGNOSIS — Z720 Tobacco use: Secondary | ICD-10-CM

## 2020-05-05 DIAGNOSIS — E118 Type 2 diabetes mellitus with unspecified complications: Secondary | ICD-10-CM | POA: Diagnosis not present

## 2020-05-05 DIAGNOSIS — I10 Essential (primary) hypertension: Secondary | ICD-10-CM | POA: Diagnosis not present

## 2020-05-05 DIAGNOSIS — N186 End stage renal disease: Secondary | ICD-10-CM

## 2020-05-05 DIAGNOSIS — Z992 Dependence on renal dialysis: Secondary | ICD-10-CM

## 2020-05-05 DIAGNOSIS — E559 Vitamin D deficiency, unspecified: Secondary | ICD-10-CM

## 2020-05-05 NOTE — Patient Instructions (Signed)

## 2020-05-05 NOTE — Progress Notes (Signed)
Cardiology Office Note:    Date:  05/05/2020   ID:  Eddie Davies, DOB 09-16-1970, MRN [id number]  PCP:  [facility]  Cardiologist:  Berniece Salines, DO  Electrophysiologist:  None   Referring MD: [facility], [place] [facility]  History of Present Illness:    Eddie Davies is a 50 y.o. male with a hx of diabetes mellitus ESRD he is on hemodialysis Monday Wednesday Fridays, hypertension, vitamin D deficiency, the patient lives at [facility] and has been there for several years now.  The patient is a very poor historian.  At his last visit in February 04, 2020 chart review reported that the patient did have some chest pain.  Due to his risk factors the patient was advised to have a pharmacologic nuclear stress test.  He was able to undergo this testing which reported to be normal.  He offers no complaints today.  Past Medical History:  Diagnosis Date  . Anxiety disorder, unspecified   . Cirrhosis ([place]) 04/2018  . Diabetes mellitus without complication ([place])    type II  . Enterocolitis due to Clostridium difficile, not specified as recurrent 08/09/2018   admission  . Epilepsy, unspecified, not intractable, without status epilepticus ([place])   . ESRD (end stage renal disease) ([place])    MWF   . Gout, unspecified   . Heart failure, unspecified ([place])   . Hypertension   . Klebsiella pneumoniae (k. pneumoniae) as the cause of diseases classified elsewhere 06/25/2018   admission  . Liver failure ([place])   . Major depressive disorder, single episode, unspecified   . Metabolic acidosis 28/3662  . Pneumonia   . Polyneuropathy, unspecified   . Tobacco use   . Vitamin D deficiency     Past Surgical History:  Procedure Laterality Date  . APPENDECTOMY  1981  . AV FISTULA PLACEMENT Right 10/23/2018   Procedure: ARTERIOVENOUS (AV) FISTULA CREATION RIGHT ARM;  Surgeon: [name], MD;  Location: [place];  Service: Vascular;  Laterality:  Right;  . CHOLECYSTECTOMY    . HERNIA REPAIR  2007  . IR FLUORO GUIDE CV LINE RIGHT  05/30/2018  . IR FLUORO GUIDE CV LINE RIGHT  03/18/2019  . IR FLUORO GUIDE CV LINE RIGHT  03/22/2019  . IR REMOVAL TUN CV CATH W/O FL  03/15/2019  . IR SINUS/FIST TUBE CHK-NON GI  04/04/2019  . IR US GUIDE VASC ACCESS RIGHT  05/30/2018  . IR US GUIDE VASC ACCESS RIGHT  03/18/2019  . IR US GUIDE VASC ACCESS RIGHT  03/22/2019  . NEPHRECTOMY Left 2020    Current Medications: No outpatient medications have been marked as taking for the 05/05/20 encounter (Office Visit) with Berniece Salines, DO.     Allergies:   Fish oil and Ethyl acetate   Social History   Socioeconomic History  . Marital status: Single    Spouse name: Not on file  . Number of children: 0  . Years of education: Not on file  . Highest education level: High school graduate  Occupational History  . Not on file  Tobacco Use  . Smoking status: Current Some Day Smoker    Packs/day: 0.25    Types: Cigarettes  . Smokeless tobacco: Never Used  Vaping Use  . Vaping Use: Never used  Substance and Sexual Activity  . Alcohol use: Not Currently  . Drug use: Never  . Sexual activity: Not on file  Other Topics Concern  . Not on  file  Social History Narrative   Lives at [facility]   Right handed   Caffeine: "none hardly"   Social Determinants of Health   Financial Resource Strain: Not on file  Food Insecurity: Not on file  Transportation Needs: Not on file  Physical Activity: Not on file  Stress: Not on file  Social Connections: Not on file     Family History: The patient's family history is negative for Seizures.  ROS:   Review of Systems  Constitution: Negative for decreased appetite, fever and weight gain.  HENT: Negative for congestion, ear discharge, hoarse voice and sore throat.   Eyes: Negative for discharge, redness, vision loss in right eye and visual halos.  Cardiovascular: Negative for chest pain, dyspnea on  exertion, leg swelling, orthopnea and palpitations.  Respiratory: Negative for cough, hemoptysis, shortness of breath and snoring.   Endocrine: Negative for heat intolerance and polyphagia.  Hematologic/Lymphatic: Negative for bleeding problem. Does not bruise/bleed easily.  Skin: Negative for flushing, nail changes, rash and suspicious lesions.  Musculoskeletal: Negative for arthritis, joint pain, muscle cramps, myalgias, neck pain and stiffness.  Gastrointestinal: Negative for abdominal pain, bowel incontinence, diarrhea and excessive appetite.  Genitourinary: Negative for decreased libido, genital sores and incomplete emptying.  Neurological: Negative for brief paralysis, focal weakness, headaches and loss of balance.  Psychiatric/Behavioral: Negative for altered mental status, depression and suicidal ideas.  Allergic/Immunologic: Negative for HIV exposure and persistent infections.    EKGs/Labs/Other Studies Reviewed:    The following studies were reviewed today:   EKG: None today  Pharmacologic stress test March 10, 2020  The left ventricular ejection fraction is normal (55-65%).  Nuclear stress EF: 62%.  There was no ST segment deviation noted during stress.  The study is normal.  This is a low risk study.   Recent Labs: 08/26/2019: ALT 13 02/04/2020: BUN 50; Creatinine, Ser 9.26; Hemoglobin 7.8; Magnesium 2.6; Platelets 100; Potassium 4.2; Sodium 135  Recent Lipid Panel    Component Value Date/Time   CHOL 76 05/24/2018 0528   TRIG 123 05/24/2018 0528   HDL 35 (L) 05/24/2018 0528   CHOLHDL 2.2 05/24/2018 0528   VLDL 25 05/24/2018 0528   LDLCALC 16 05/24/2018 0528    Physical Exam:    VS:  BP 118/64   Pulse 82   Ht 6' 2"  (1.88 m)   Wt 228 lb 4.8 oz (103.6 kg)   SpO2 97%   BMI 29.31 kg/m     Wt Readings from Last 3 Encounters:  05/05/20 228 lb 4.8 oz (103.6 kg)  03/10/20 244 lb (110.7 kg)  02/04/20 244 lb 9.6 oz (110.9 kg)     GEN: Well nourished,  well developed in no acute distress HEENT: Normal NECK: No JVD; No carotid bruits LYMPHATICS: No lymphadenopathy CARDIAC: S1S2 noted,RRR, no murmurs, rubs, gallops RESPIRATORY:  Clear to auscultation without rales, wheezing or rhonchi  ABDOMEN: Soft, non-tender, non-distended, +bowel sounds, no guarding. EXTREMITIES: No edema, No cyanosis, no clubbing MUSCULOSKELETAL:  No deformity  SKIN: Warm and dry NEUROLOGIC:  Alert and oriented x 3, non-focal PSYCHIATRIC:  Normal affect, good insight  ASSESSMENT:    1. Benign essential HTN   2. Type 2 diabetes mellitus with unspecified complications ([place])   3. ESRD on hemodialysis ([place])   4. Tobacco use   5. Vitamin D deficiency    PLAN:     1.  He appears to be doing well from a cardiovascular standpoint no further testing will require at this  time.  2.  This is being managed by his primary care doctor.  No adjustments for antidiabetic medications were made today.  3.  Continue with his hemodialysis on schedule days.  4.  Blood pressure is acceptable, continue with current antihypertensive regimen which includes  5.  Smoking cessation advised  The patient is in agreement with the above plan. The patient left the office in stable condition.  The patient will follow up in   Medication Adjustments/Labs and Tests Ordered: Current medicines are reviewed at length with the patient today.  Concerns regarding medicines are outlined above.  No orders of the defined types were placed in this encounter.  No orders of the defined types were placed in this encounter.   Patient Instructions  Medication Instructions:  Your physician recommends that you continue on your current medications as directed. Please refer to the Current Medication list given to you today.  *If you need a refill on your cardiac medications before your next appointment, please call your pharmacy*   Lab Work: None If you have labs (blood work) drawn today and your  tests are completely normal, you will receive your results only by: Marland Kitchen MyChart Message (if you have MyChart) OR . A paper copy in the mail If you have any lab test that is abnormal or we need to change your treatment, we will call you to review the results.   Testing/Procedures: None   Follow-Up: At [facility], you and your health needs are our priority.  As part of our continuing mission to provide you with exceptional heart care, we have created designated Provider Care Teams.  These Care Teams include your primary Cardiologist (physician) and Advanced Practice Providers (APPs -  Physician Assistants and Nurse Practitioners) who all work together to provide you with the care you need, when you need it.  We recommend signing up for the patient portal called "MyChart".  Sign up information is provided on this After Visit Summary.  MyChart is used to connect with patients for Virtual Visits (Telemedicine).  Patients are able to view lab/test results, encounter notes, upcoming appointments, etc.  Non-urgent messages can be sent to your provider as well.   To learn more about what you can do with MyChart, go to [email or web address].    Your next appointment:   1 year(s)  The format for your next appointment:   In Person  Provider:   Berniece Salines, DO   Other Instructions      Adopting a Healthy Lifestyle.  Know what a healthy weight is for you (roughly BMI <25) and aim to maintain this   Aim for 7+ servings of fruits and vegetables daily   65-80+ fluid ounces of water or unsweet tea for healthy kidneys   Limit to max 1 drink of alcohol per day; avoid smoking/tobacco   Limit animal fats in diet for cholesterol and heart health - choose grass fed whenever available   Avoid highly processed foods, and foods high in saturated/trans fats   Aim for low stress - take time to unwind and care for your mental health   Aim for 150 min of moderate intensity exercise weekly for  heart health, and weights twice weekly for bone health   Aim for 7-9 hours of sleep daily   When it comes to diets, agreement about the perfect plan isnt easy to find, even among the experts. Experts at the [employer] developed an idea known as the Healthy Eating Plate. Just  imagine a plate divided into logical, healthy portions.   The emphasis is on diet quality:   Load up on vegetables and fruits - one-half of your plate: Aim for color and variety, and remember that potatoes dont count.   Go for whole grains - one-quarter of your plate: Whole wheat, barley, wheat berries, quinoa, oats, brown rice, and foods made with them. If you want pasta, go with whole wheat pasta.   Protein power - one-quarter of your plate: Fish, chicken, beans, and nuts are all healthy, versatile protein sources. Limit red meat.   The diet, however, does go beyond the plate, offering a few other suggestions.   Use healthy plant oils, such as olive, canola, soy, corn, sunflower and peanut. Check the labels, and avoid partially hydrogenated oil, which have unhealthy trans fats.   If youre thirsty, drink water. Coffee and tea are good in moderation, but skip sugary drinks and limit milk and dairy products to one or two daily servings.   The type of carbohydrate in the diet is more important than the amount. Some sources of carbohydrates, such as vegetables, fruits, whole grains, and beans-are healthier than others.   Finally, stay active  Signed, Berniece Salines, DO  05/05/2020 11:34 AM    [place]

## 2020-06-10 IMAGING — DX PORTABLE CHEST - 1 VIEW
1 series · 1 of 1 positions shown · non-contrast
Comparison: Earlier today.

CLINICAL DATA: Acute respiratory failure.

EXAM:
PORTABLE CHEST 1 VIEW

[chest ap]
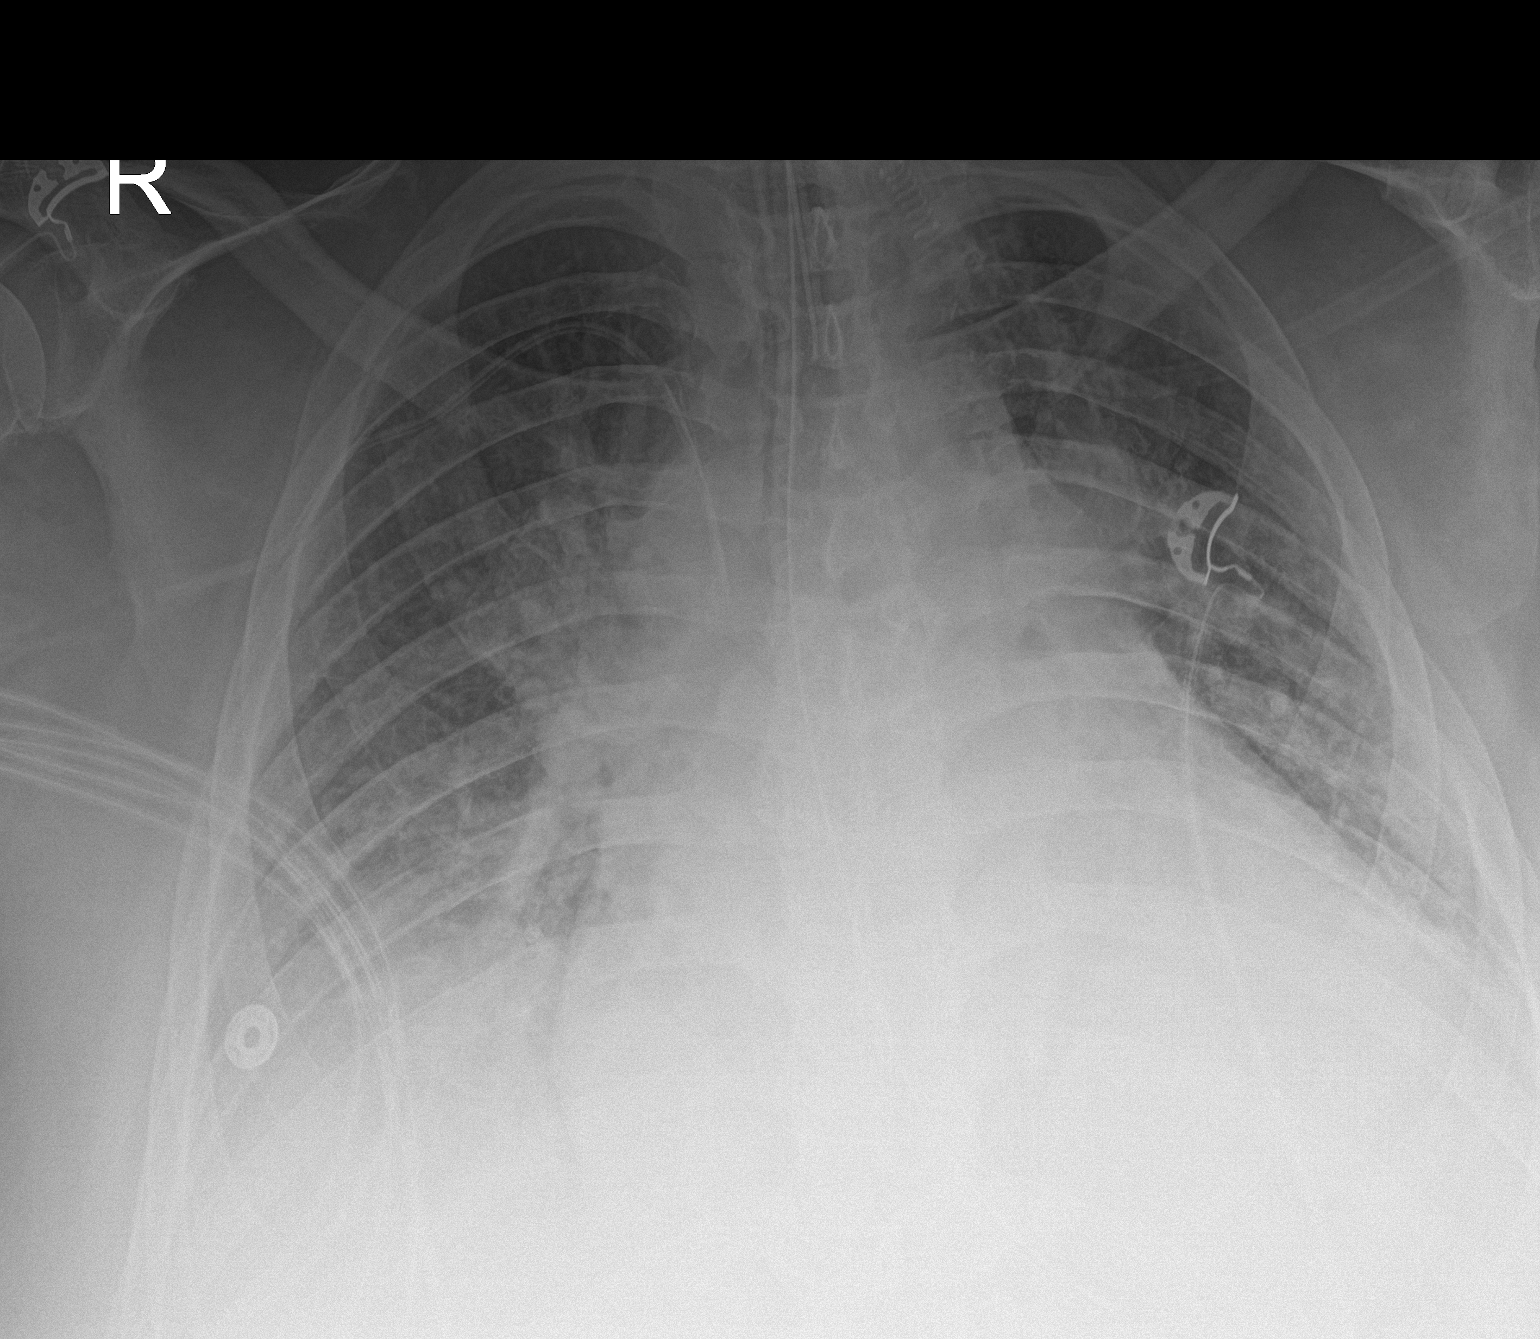

[1 of 1 positions shown; findings below may reference images not displayed]

FINDINGS: Endotracheal tube in satisfactory position. Stable right central
venous catheter. Nasogastric tube extending into the stomach.

Progressive enlargement of the cardiac silhouette. Diffuse
prominence of the pulmonary vasculature and interstitial markings
with less bilateral airspace opacity with an improved inspiration.
Unremarkable bones.
IMPRESSION: Mildly progressive cardiomegaly with grossly stable changes of
congestive heart failure.

## 2020-06-10 IMAGING — US RENAL/URINARY TRACT ULTRASOUND
1 series · 14 of 25 positions shown · non-contrast
Comparison: Yesterday.

CLINICAL DATA: Acute renal failure.

EXAM:
RENAL / URINARY TRACT ULTRASOUND COMPLETE

[Series 1: renal/urinary tract ultrasound · 14 of 29 slices shown]
[im 1/29]
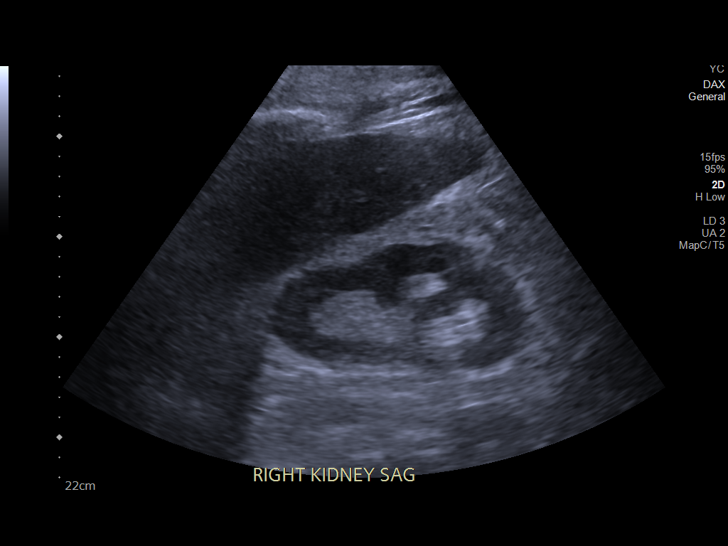
[im 3/29]
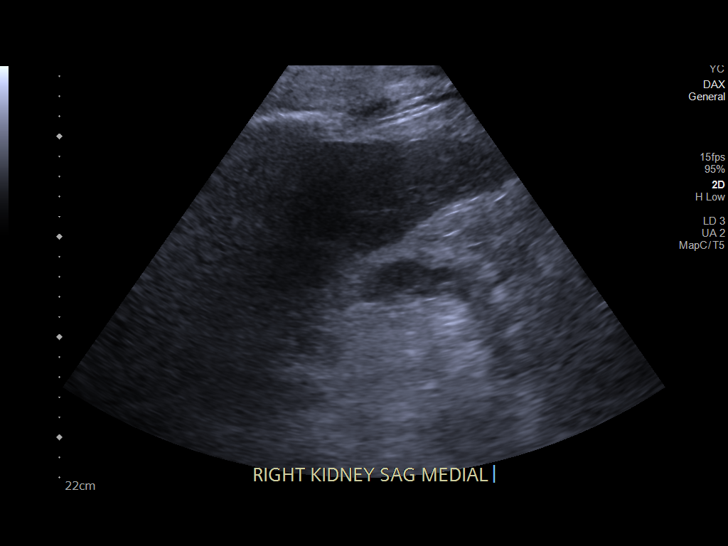
[im 5/29]
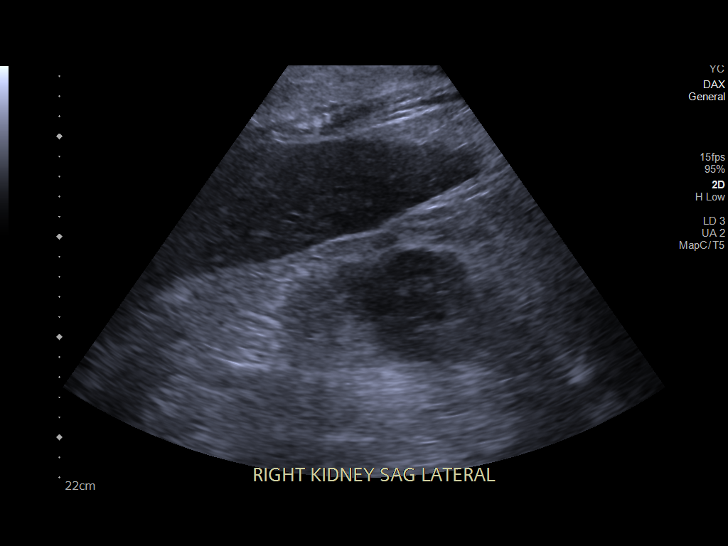
[im 8/29]
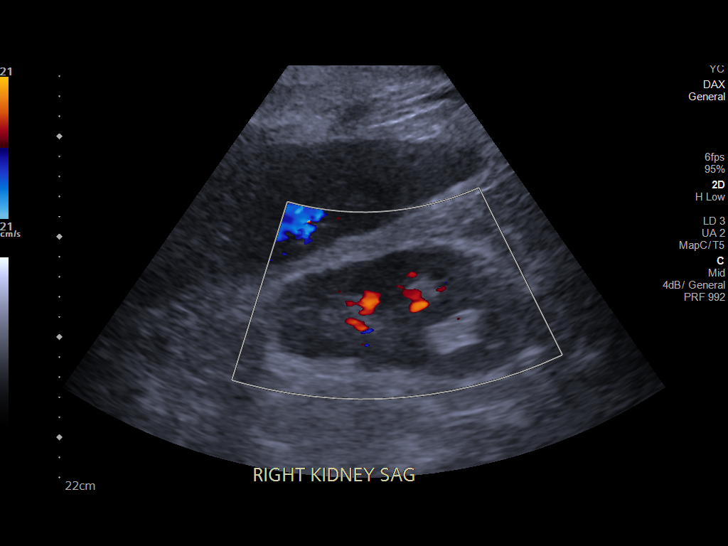
[im 10/29]
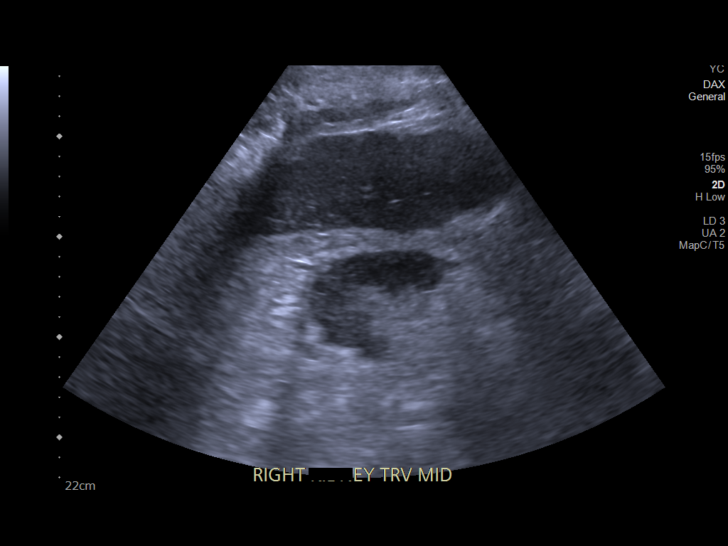
[im 11/29]
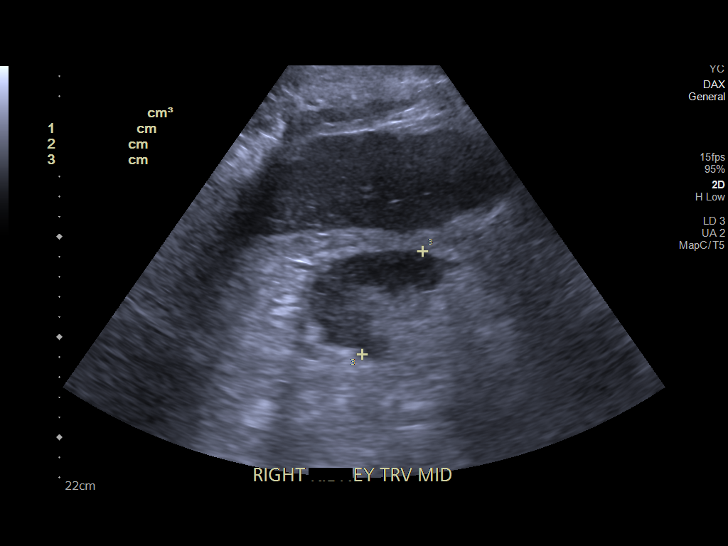
[im 13/29]
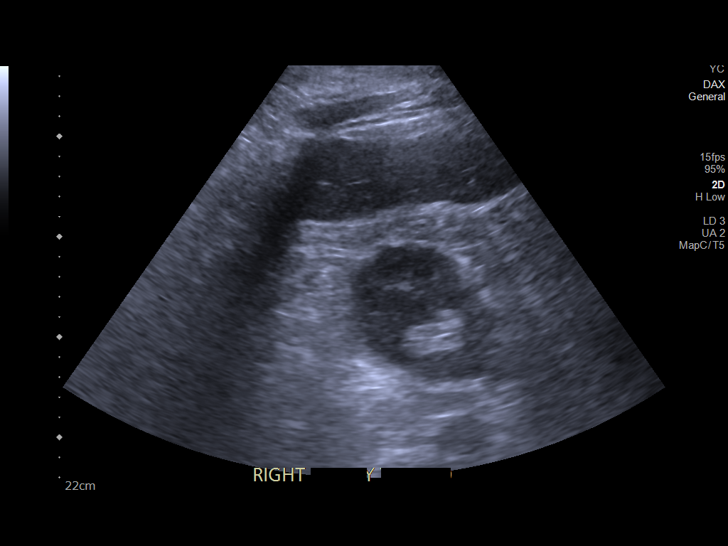
[im 16/29]
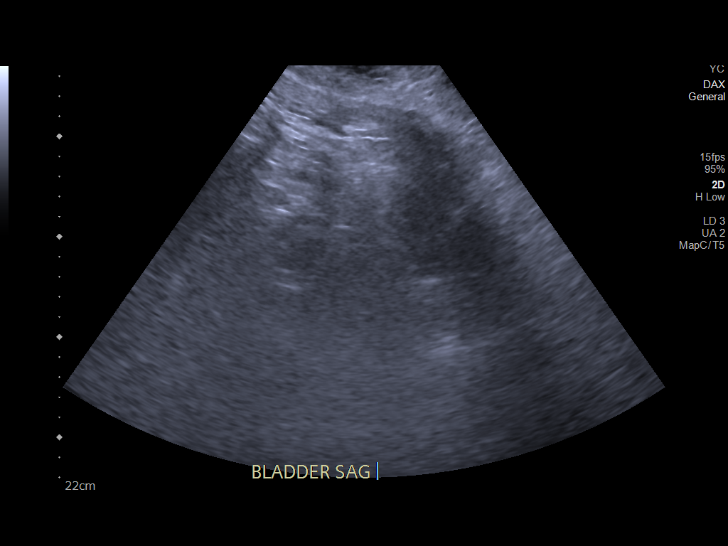
[im 18/29]
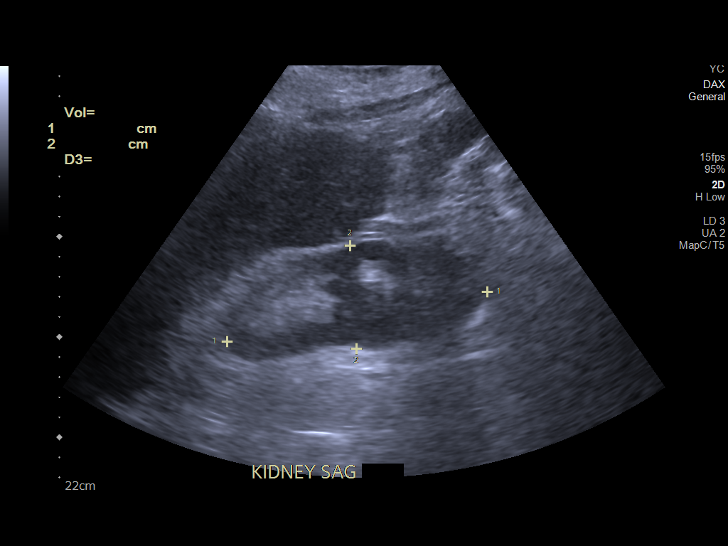
[im 19/29]
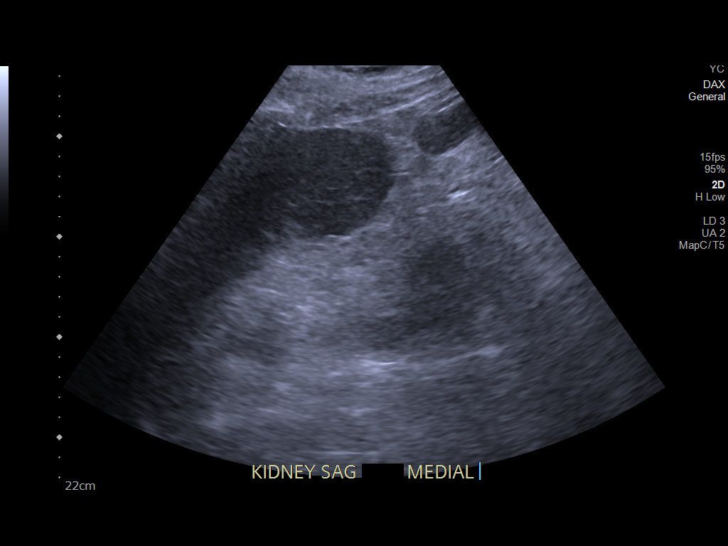
[im 22/29]
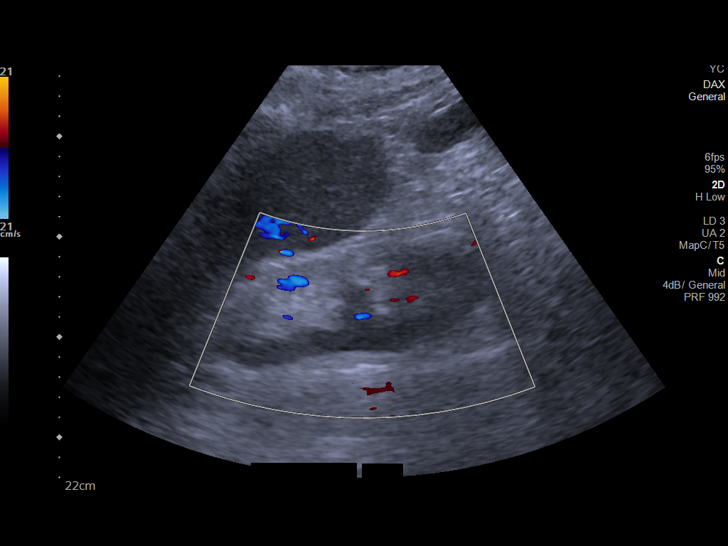
[im 24/29]
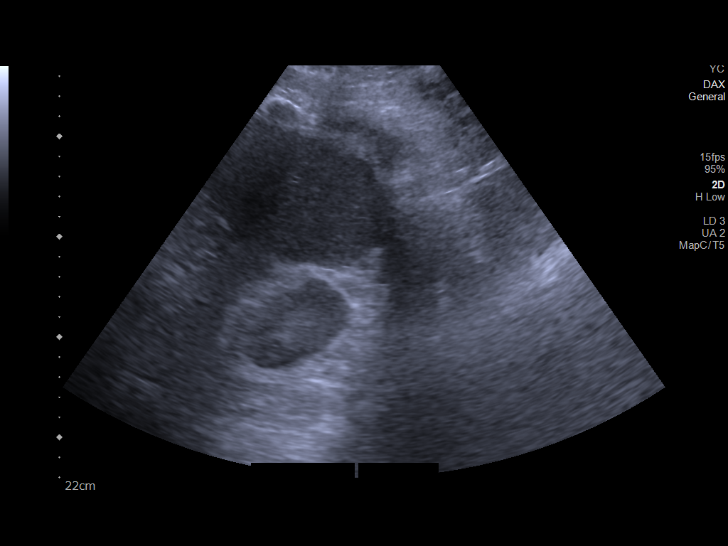
[im 26/29]
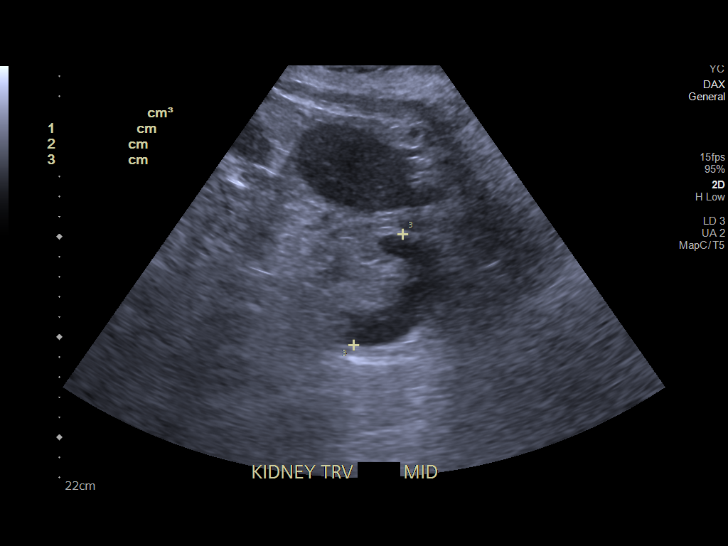
[im 29/29]
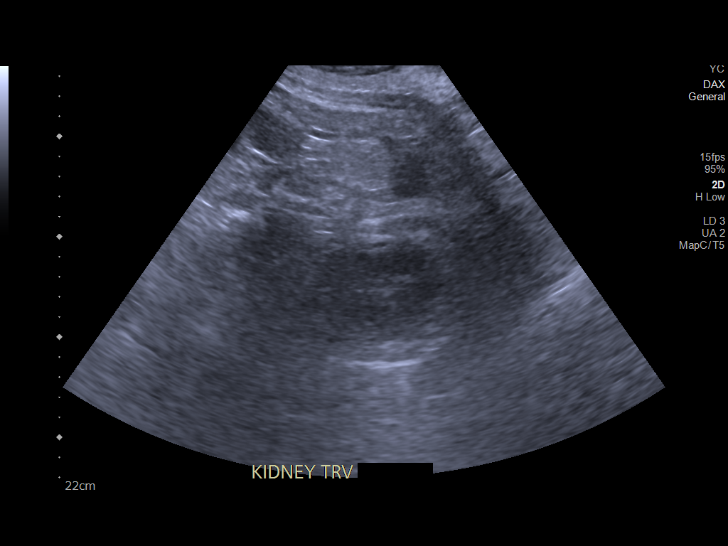

[14 of 25 positions shown; findings below may reference images not displayed]

FINDINGS: Right Kidney:

Renal measurements: 13.2 x 6.8 x 6 0 cm = volume: 281 mL .
Echogenicity within normal limits. No mass or hydronephrosis
visualized.

Left Kidney:

Renal measurements: 13.2 x 6 0 x 5.2 cm = volume: 215 mL.
Echogenicity within normal limits. No mass or hydronephrosis
visualized.

Bladder:

Not visualized with a Foley catheter in place.
IMPRESSION: 1. Normal kidneys without hydronephrosis.
2. Nonvisualized urinary bladder with a Foley catheter in place.

## 2020-06-11 IMAGING — DX CHEST  1 VIEW
1 series · 1 of 1 positions shown · non-contrast
Comparison: 04/08/2018

CLINICAL DATA: Hemodialysis catheter insertion.

EXAM:
CHEST  1 VIEW

[chest]
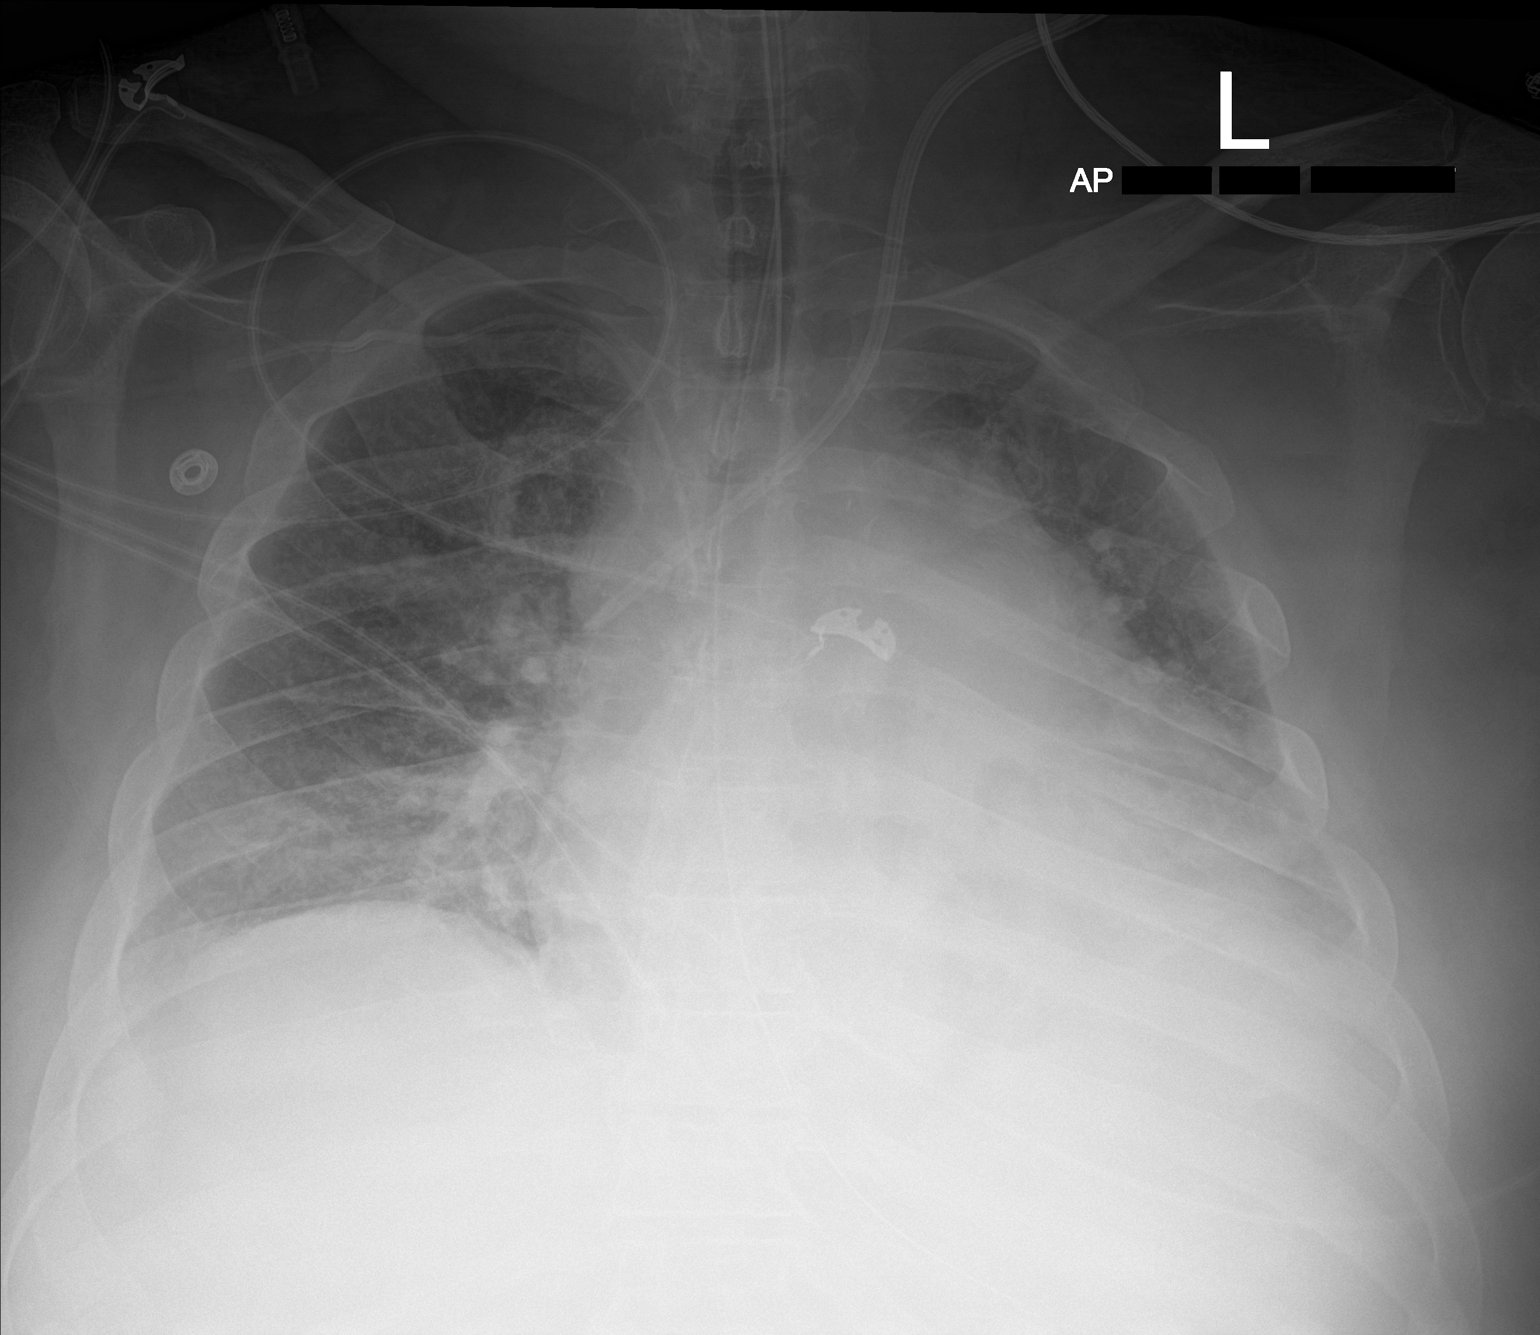

[1 of 1 positions shown; findings below may reference images not displayed]

FINDINGS: There is a right IJ central venous catheter in the mid SVC. The
catheter tip is long lateral wall near the brachiocephalic SVC
junction.

The endotracheal tube is in good position at the mid tracheal level.
There is an NG tube coursing down the esophagus and into the
stomach.

Stable cardiac enlargement and prominent mediastinum. Persistent
pulmonary edema and pleural effusions. Slight improved lung aeration
when compared to earlier film.
IMPRESSION: 1. Left IJ central venous catheter tip in the SVC near the
brachiocephalic SVC junction.
2. Right subclavian central venous catheter is stable.
3. Stable ET and NG tubes.
4. Persistent cardiac enlargement, central vascular congestion and
pulmonary edema but slight improved aeration since earlier film.
5. Small effusions.

## 2020-06-11 IMAGING — DX PORTABLE CHEST - 1 VIEW
1 series · 1 of 1 positions shown · non-contrast
Comparison: 04/07/2018

CLINICAL DATA: Respiratory failure

EXAM:
PORTABLE CHEST 1 VIEW

[chest ap]
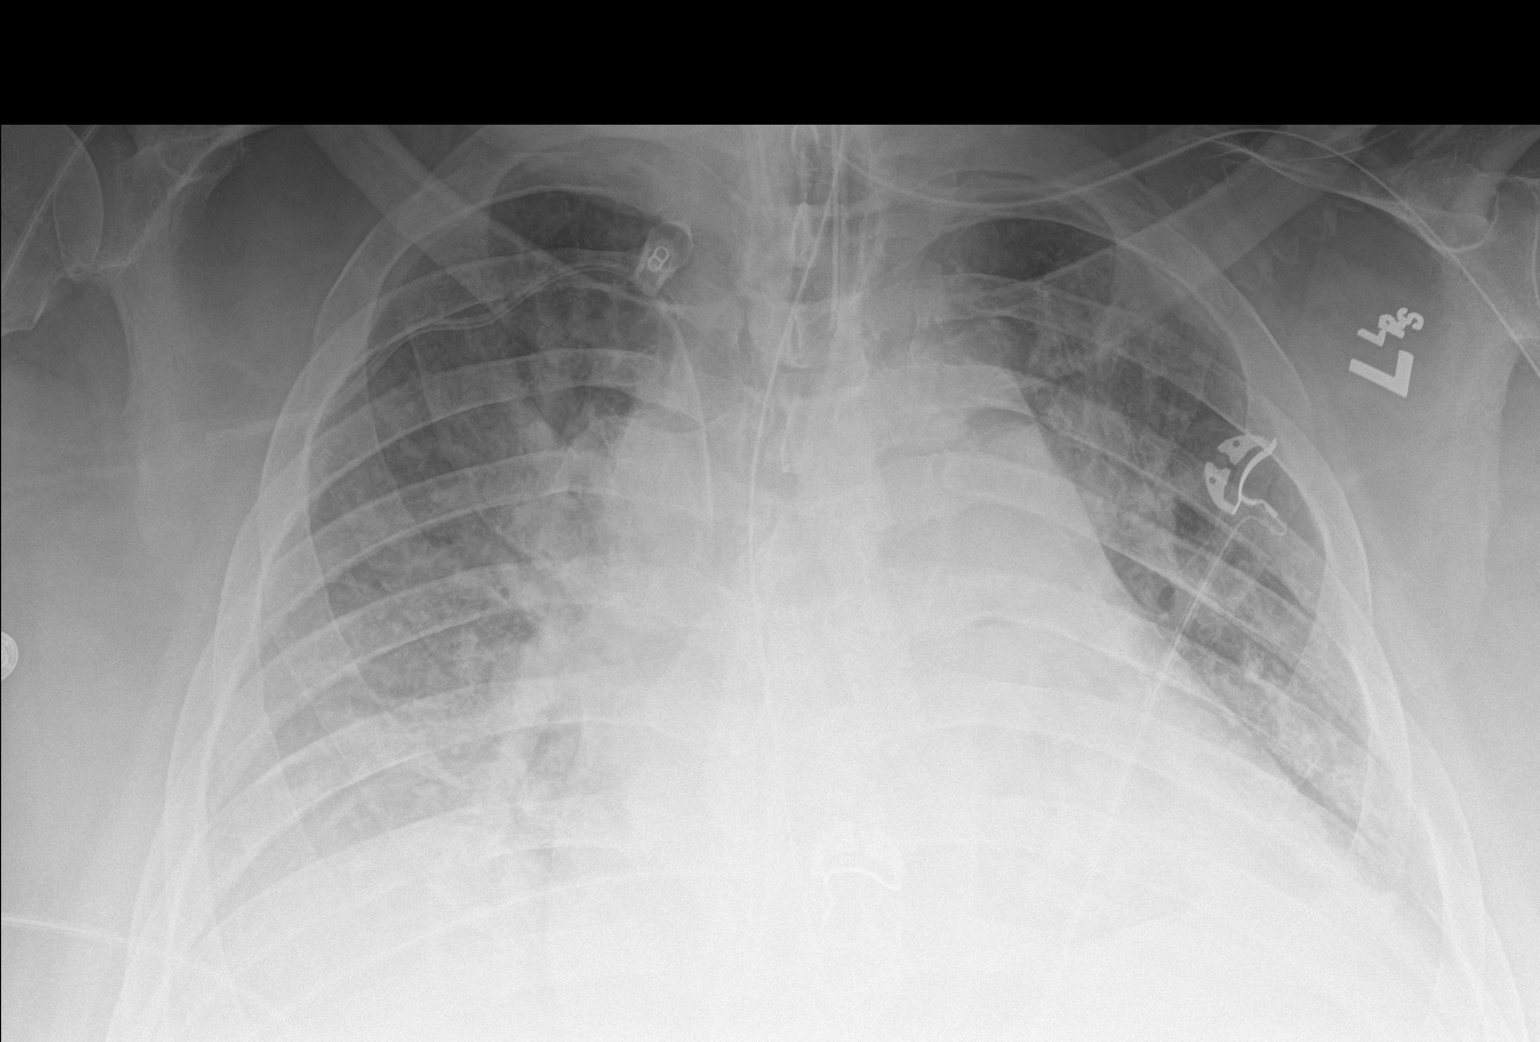

[1 of 1 positions shown; findings below may reference images not displayed]

FINDINGS: Support Apparatus:

--Endotracheal tube: Tip at the level of the clavicular heads.

--Enteric tube:Tip and sideport are below the field of view.

--Catheter(s):Right subclavian vein approach central venous catheter
tip is at the lower SVC

--Other: None

Unchanged cardiomegaly and mild pulmonary edema. Failing pleural
effusions are also unchanged.
IMPRESSION: Unchanged appearance of the chest with findings of congestive heart
failure. Unchanged support apparatus.

## 2020-06-30 ENCOUNTER — Ambulatory Visit: Payer: Medicare Other | Admitting: Sports Medicine

## 2020-07-22 IMAGING — DX PORTABLE CHEST - 1 VIEW
1 series · 1 of 1 positions shown · non-contrast
Comparison: Portable chest x-ray earlier today at [DATE] a.m. and
previously.

CLINICAL DATA: Bedside central venous catheter placement.

EXAM:
PORTABLE CHEST 1 VIEW [DATE] p.m.:

[chest ap]
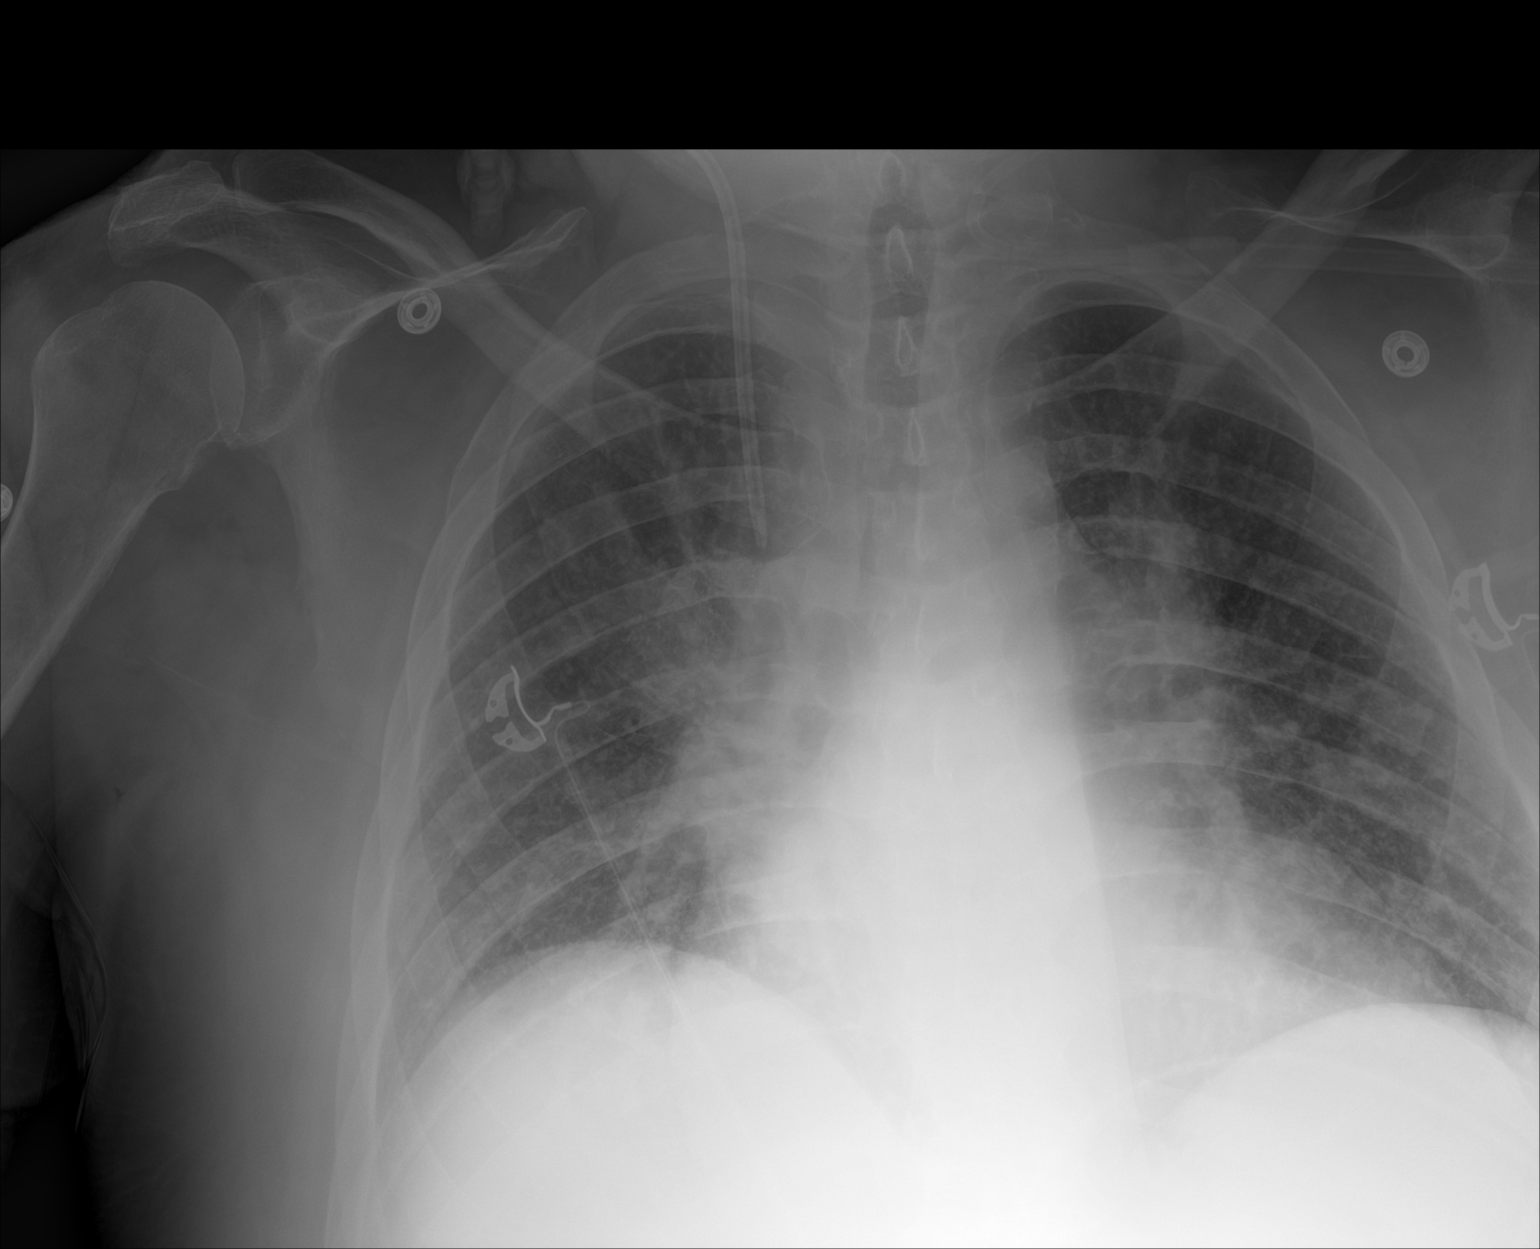

[1 of 1 positions shown; findings below may reference images not displayed]

FINDINGS: RIGHT jugular central venous catheter tip projects over the UPPER
SVC. No evidence of pneumothorax or mediastinal hematoma. Near
expiratory image which accounts for the bibasilar atelectasis. New
mild perihilar airspace pulmonary edema since the examination
earlier this morning.
IMPRESSION: 1. RIGHT jugular central venous catheter tip projects over the SVC.
No acute complicating features.
2. New mild perihilar airspace pulmonary edema since the examination
earlier this morning.

## 2020-08-04 ENCOUNTER — Encounter (HOSPITAL_COMMUNITY): Payer: Self-pay | Admitting: *Deleted

## 2020-08-04 ENCOUNTER — Emergency Department (HOSPITAL_COMMUNITY): Payer: Medicare Other

## 2020-08-04 ENCOUNTER — Inpatient Hospital Stay (HOSPITAL_COMMUNITY)
Admission: EM | Admit: 2020-08-04 | Discharge: 2020-08-05 | DRG: 637 | Disposition: A | Discharge status: Skilled Nursing Facility | Payer: Medicare Other | Source: Skilled Nursing Facility | Attending: Pulmonary Disease | Admitting: Pulmonary Disease

## 2020-08-04 DIAGNOSIS — Z8616 Personal history of COVID-19: Secondary | ICD-10-CM | POA: Diagnosis not present

## 2020-08-04 DIAGNOSIS — G40909 Epilepsy, unspecified, not intractable, without status epilepticus: Secondary | ICD-10-CM | POA: Diagnosis present

## 2020-08-04 DIAGNOSIS — Z993 Dependence on wheelchair: Secondary | ICD-10-CM

## 2020-08-04 DIAGNOSIS — Z992 Dependence on renal dialysis: Secondary | ICD-10-CM | POA: Diagnosis not present

## 2020-08-04 DIAGNOSIS — D696 Thrombocytopenia, unspecified: Secondary | ICD-10-CM | POA: Diagnosis present

## 2020-08-04 DIAGNOSIS — F17210 Nicotine dependence, cigarettes, uncomplicated: Secondary | ICD-10-CM | POA: Diagnosis present

## 2020-08-04 DIAGNOSIS — D631 Anemia in chronic kidney disease: Secondary | ICD-10-CM | POA: Diagnosis present

## 2020-08-04 DIAGNOSIS — E785 Hyperlipidemia, unspecified: Secondary | ICD-10-CM | POA: Diagnosis present

## 2020-08-04 DIAGNOSIS — Z9115 Patient's noncompliance with renal dialysis: Secondary | ICD-10-CM

## 2020-08-04 DIAGNOSIS — N186 End stage renal disease: Secondary | ICD-10-CM | POA: Diagnosis present

## 2020-08-04 DIAGNOSIS — N400 Enlarged prostate without lower urinary tract symptoms: Secondary | ICD-10-CM | POA: Diagnosis present

## 2020-08-04 DIAGNOSIS — Z20822 Contact with and (suspected) exposure to covid-19: Secondary | ICD-10-CM | POA: Diagnosis present

## 2020-08-04 DIAGNOSIS — E1142 Type 2 diabetes mellitus with diabetic polyneuropathy: Secondary | ICD-10-CM | POA: Diagnosis present

## 2020-08-04 DIAGNOSIS — K219 Gastro-esophageal reflux disease without esophagitis: Secondary | ICD-10-CM | POA: Diagnosis present

## 2020-08-04 DIAGNOSIS — E559 Vitamin D deficiency, unspecified: Secondary | ICD-10-CM | POA: Diagnosis present

## 2020-08-04 DIAGNOSIS — Z905 Acquired absence of kidney: Secondary | ICD-10-CM

## 2020-08-04 DIAGNOSIS — L409 Psoriasis, unspecified: Secondary | ICD-10-CM | POA: Diagnosis present

## 2020-08-04 DIAGNOSIS — K7460 Unspecified cirrhosis of liver: Secondary | ICD-10-CM | POA: Diagnosis present

## 2020-08-04 DIAGNOSIS — Z7982 Long term (current) use of aspirin: Secondary | ICD-10-CM

## 2020-08-04 DIAGNOSIS — E0810 Diabetes mellitus due to underlying condition with ketoacidosis without coma: Secondary | ICD-10-CM | POA: Diagnosis not present

## 2020-08-04 DIAGNOSIS — G9341 Metabolic encephalopathy: Secondary | ICD-10-CM | POA: Diagnosis present

## 2020-08-04 DIAGNOSIS — M109 Gout, unspecified: Secondary | ICD-10-CM | POA: Diagnosis present

## 2020-08-04 DIAGNOSIS — F419 Anxiety disorder, unspecified: Secondary | ICD-10-CM | POA: Diagnosis present

## 2020-08-04 DIAGNOSIS — E1110 Type 2 diabetes mellitus with ketoacidosis without coma: Secondary | ICD-10-CM

## 2020-08-04 DIAGNOSIS — E1122 Type 2 diabetes mellitus with diabetic chronic kidney disease: Secondary | ICD-10-CM | POA: Diagnosis present

## 2020-08-04 DIAGNOSIS — E1010 Type 1 diabetes mellitus with ketoacidosis without coma: Secondary | ICD-10-CM | POA: Diagnosis not present

## 2020-08-04 DIAGNOSIS — F329 Major depressive disorder, single episode, unspecified: Secondary | ICD-10-CM | POA: Diagnosis present

## 2020-08-04 DIAGNOSIS — I120 Hypertensive chronic kidney disease with stage 5 chronic kidney disease or end stage renal disease: Secondary | ICD-10-CM | POA: Diagnosis present

## 2020-08-04 DIAGNOSIS — R739 Hyperglycemia, unspecified: Secondary | ICD-10-CM

## 2020-08-04 DIAGNOSIS — Z7401 Bed confinement status: Secondary | ICD-10-CM

## 2020-08-04 DIAGNOSIS — Z794 Long term (current) use of insulin: Secondary | ICD-10-CM

## 2020-08-04 DIAGNOSIS — R001 Bradycardia, unspecified: Secondary | ICD-10-CM | POA: Diagnosis present

## 2020-08-04 DIAGNOSIS — E875 Hyperkalemia: Secondary | ICD-10-CM | POA: Diagnosis present

## 2020-08-04 DIAGNOSIS — Z79899 Other long term (current) drug therapy: Secondary | ICD-10-CM

## 2020-08-04 DIAGNOSIS — E8889 Other specified metabolic disorders: Secondary | ICD-10-CM | POA: Diagnosis present

## 2020-08-04 HISTORY — DX: Type 2 diabetes mellitus with ketoacidosis without coma: E11.10

## 2020-08-04 LAB — CBC WITH DIFFERENTIAL/PLATELET
Abs Immature Granulocytes: 0.1 10*3/uL — ABNORMAL HIGH (ref 0.00–0.07)
Basophils Absolute: 0.1 10*3/uL (ref 0.0–0.1)
Basophils Relative: 1 %
Eosinophils Absolute: 0.1 10*3/uL (ref 0.0–0.5)
Eosinophils Relative: 2 %
HCT: 30.8 % — ABNORMAL LOW (ref 39.0–52.0)
Hemoglobin: 10 g/dL — ABNORMAL LOW (ref 13.0–17.0)
Immature Granulocytes: 2 %
Lymphocytes Relative: 22 %
Lymphs Abs: 1.3 10*3/uL (ref 0.7–4.0)
MCH: 35.1 pg — ABNORMAL HIGH (ref 26.0–34.0)
MCHC: 32.5 g/dL (ref 30.0–36.0)
MCV: 108.1 fL — ABNORMAL HIGH (ref 80.0–100.0)
Monocytes Absolute: 0.5 10*3/uL (ref 0.1–1.0)
Monocytes Relative: 8 %
Neutro Abs: 4 10*3/uL (ref 1.7–7.7)
Neutrophils Relative %: 65 %
Platelets: 77 10*3/uL — ABNORMAL LOW (ref 150–400)
RBC: 2.85 MIL/uL — ABNORMAL LOW (ref 4.22–5.81)
RDW: 14.6 % (ref 11.5–15.5)
WBC: 6 10*3/uL (ref 4.0–10.5)
nRBC: 0 % (ref 0.0–0.2)

## 2020-08-04 LAB — DIFFERENTIAL
Abs Immature Granulocytes: 0.07 10*3/uL (ref 0.00–0.07)
Basophils Absolute: 0.1 10*3/uL (ref 0.0–0.1)
Basophils Relative: 1 %
Eosinophils Absolute: 0.1 10*3/uL (ref 0.0–0.5)
Eosinophils Relative: 2 %
Immature Granulocytes: 2 %
Lymphocytes Relative: 28 %
Lymphs Abs: 1.2 10*3/uL (ref 0.7–4.0)
Monocytes Absolute: 0.3 10*3/uL (ref 0.1–1.0)
Monocytes Relative: 6 %
Neutro Abs: 2.7 10*3/uL (ref 1.7–7.7)
Neutrophils Relative %: 61 %

## 2020-08-04 LAB — CBC
HCT: 31.3 % — ABNORMAL LOW (ref 39.0–52.0)
HCT: 29 % — ABNORMAL LOW (ref 39.0–52.0)
Hemoglobin: 9.9 g/dL — ABNORMAL LOW (ref 13.0–17.0)
Hemoglobin: 9.8 g/dL — ABNORMAL LOW (ref 13.0–17.0)
MCH: 34.4 pg — ABNORMAL HIGH (ref 26.0–34.0)
MCH: 34.8 pg — ABNORMAL HIGH (ref 26.0–34.0)
MCHC: 31.6 g/dL (ref 30.0–36.0)
MCHC: 33.8 g/dL (ref 30.0–36.0)
MCV: 108.7 fL — ABNORMAL HIGH (ref 80.0–100.0)
MCV: 102.8 fL — ABNORMAL HIGH (ref 80.0–100.0)
Platelets: 76 10*3/uL — ABNORMAL LOW (ref 150–400)
Platelets: 62 10*3/uL — ABNORMAL LOW (ref 150–400)
RBC: 2.88 MIL/uL — ABNORMAL LOW (ref 4.22–5.81)
RBC: 2.82 MIL/uL — ABNORMAL LOW (ref 4.22–5.81)
RDW: 14.7 % (ref 11.5–15.5)
RDW: 14.4 % (ref 11.5–15.5)
WBC: 6.3 10*3/uL (ref 4.0–10.5)
WBC: 4.4 10*3/uL (ref 4.0–10.5)
nRBC: 0 % (ref 0.0–0.2)
nRBC: 0 % (ref 0.0–0.2)

## 2020-08-04 LAB — LACTIC ACID, PLASMA
Lactic Acid, Venous: 1.7 mmol/L (ref 0.5–1.9)
Lactic Acid, Venous: 1.2 mmol/L (ref 0.5–1.9)

## 2020-08-04 LAB — I-STAT VENOUS BLOOD GAS, ED
Acid-base deficit: 19 mmol/L — ABNORMAL HIGH (ref 0.0–2.0)
Bicarbonate: 9.8 mmol/L — ABNORMAL LOW (ref 20.0–28.0)
Calcium, Ion: 0.92 mmol/L — ABNORMAL LOW (ref 1.15–1.40)
HCT: 29 % — ABNORMAL LOW (ref 39.0–52.0)
Hemoglobin: 9.9 g/dL — ABNORMAL LOW (ref 13.0–17.0)
O2 Saturation: 98 %
Potassium: 6.8 mmol/L (ref 3.5–5.1)
Sodium: 125 mmol/L — ABNORMAL LOW (ref 135–145)
TCO2: 11 mmol/L — ABNORMAL LOW (ref 22–32)
pCO2, Ven: 31.6 mmHg — ABNORMAL LOW (ref 44.0–60.0)
pH, Ven: 7.101 — CL (ref 7.250–7.430)
pO2, Ven: 143 mmHg — ABNORMAL HIGH (ref 32.0–45.0)

## 2020-08-04 LAB — I-STAT ARTERIAL BLOOD GAS, ED
Acid-base deficit: 15 mmol/L — ABNORMAL HIGH (ref 0.0–2.0)
Bicarbonate: 12.8 mmol/L — ABNORMAL LOW (ref 20.0–28.0)
Calcium, Ion: 1.06 mmol/L — ABNORMAL LOW (ref 1.15–1.40)
HCT: 26 % — ABNORMAL LOW (ref 39.0–52.0)
Hemoglobin: 8.8 g/dL — ABNORMAL LOW (ref 13.0–17.0)
O2 Saturation: 97 %
Patient temperature: 97.9
Potassium: 4.6 mmol/L (ref 3.5–5.1)
Sodium: 130 mmol/L — ABNORMAL LOW (ref 135–145)
TCO2: 14 mmol/L — ABNORMAL LOW (ref 22–32)
pCO2 arterial: 35.6 mmHg (ref 32.0–48.0)
pH, Arterial: 7.162 — CL (ref 7.350–7.450)
pO2, Arterial: 109 mmHg — ABNORMAL HIGH (ref 83.0–108.0)

## 2020-08-04 LAB — COMPREHENSIVE METABOLIC PANEL
ALT: 20 U/L (ref 0–44)
AST: 23 U/L (ref 15–41)
Albumin: 2.8 g/dL — ABNORMAL LOW (ref 3.5–5.0)
Alkaline Phosphatase: 124 U/L (ref 38–126)
Anion gap: 16 — ABNORMAL HIGH (ref 5–15)
BUN: 73 mg/dL — ABNORMAL HIGH (ref 6–20)
CO2: 10 mmol/L — ABNORMAL LOW (ref 22–32)
Calcium: 6.7 mg/dL — ABNORMAL LOW (ref 8.9–10.3)
Chloride: 98 mmol/L (ref 98–111)
Creatinine, Ser: 12.61 mg/dL — ABNORMAL HIGH (ref 0.61–1.24)
GFR, Estimated: 4 mL/min — ABNORMAL LOW (ref >60)
Glucose, Bld: 758 mg/dL (ref 70–99)
Potassium: 6.8 mmol/L (ref 3.5–5.1)
Sodium: 124 mmol/L — ABNORMAL LOW (ref 135–145)
Total Bilirubin: 0.8 mg/dL (ref 0.3–1.2)
Total Protein: 6.1 g/dL — ABNORMAL LOW (ref 6.5–8.1)

## 2020-08-04 LAB — CBG MONITORING, ED
Glucose-Capillary: >600 mg/dL (ref 70–99)
Glucose-Capillary: 543 mg/dL (ref 70–99)
Glucose-Capillary: 517 mg/dL (ref 70–99)
Glucose-Capillary: 455 mg/dL — ABNORMAL HIGH (ref 70–99)

## 2020-08-04 LAB — MRSA NEXT GEN BY PCR, NASAL: MRSA by PCR Next Gen: NOT DETECTED

## 2020-08-04 LAB — I-STAT CHEM 8, ED
BUN: 91 mg/dL — ABNORMAL HIGH (ref 6–20)
Calcium, Ion: 0.91 mmol/L — ABNORMAL LOW (ref 1.15–1.40)
Chloride: 102 mmol/L (ref 98–111)
Creatinine, Ser: 13.8 mg/dL — ABNORMAL HIGH (ref 0.61–1.24)
Glucose, Bld: >700 mg/dL (ref 70–99)
HCT: 30 % — ABNORMAL LOW (ref 39.0–52.0)
Hemoglobin: 10.2 g/dL — ABNORMAL LOW (ref 13.0–17.0)
Potassium: 6.9 mmol/L (ref 3.5–5.1)
Sodium: 125 mmol/L — ABNORMAL LOW (ref 135–145)
TCO2: 12 mmol/L — ABNORMAL LOW (ref 22–32)

## 2020-08-04 LAB — BETA-HYDROXYBUTYRIC ACID
Beta-Hydroxybutyric Acid: 0.36 mmol/L — ABNORMAL HIGH (ref 0.05–0.27)
Beta-Hydroxybutyric Acid: 0.09 mmol/L (ref 0.05–0.27)

## 2020-08-04 LAB — LIPASE, BLOOD: Lipase: 19 U/L (ref 11–51)

## 2020-08-04 LAB — PROTIME-INR
INR: 1.3 — ABNORMAL HIGH (ref 0.8–1.2)
Prothrombin Time: 16.2 seconds — ABNORMAL HIGH (ref 11.4–15.2)

## 2020-08-04 LAB — PROCALCITONIN: Procalcitonin: 0.27 ng/mL

## 2020-08-04 LAB — HIV ANTIBODY (ROUTINE TESTING W REFLEX): HIV Screen 4th Generation wRfx: NONREACTIVE

## 2020-08-04 LAB — RESP PANEL BY RT-PCR (FLU A&B, COVID) ARPGX2
Influenza A by PCR: NEGATIVE
Influenza B by PCR: NEGATIVE
SARS Coronavirus 2 by RT PCR: NEGATIVE

## 2020-08-04 LAB — APTT: aPTT: 47 seconds — ABNORMAL HIGH (ref 24–36)

## 2020-08-04 LAB — CREATININE, SERUM
Creatinine, Ser: 12.06 mg/dL — ABNORMAL HIGH (ref 0.61–1.24)
GFR, Estimated: 5 mL/min — ABNORMAL LOW (ref >60)

## 2020-08-04 LAB — GLUCOSE, CAPILLARY
Glucose-Capillary: 101 mg/dL — ABNORMAL HIGH (ref 70–99)
Glucose-Capillary: 105 mg/dL — ABNORMAL HIGH (ref 70–99)
Glucose-Capillary: 120 mg/dL — ABNORMAL HIGH (ref 70–99)
Glucose-Capillary: 123 mg/dL — ABNORMAL HIGH (ref 70–99)
Glucose-Capillary: 169 mg/dL — ABNORMAL HIGH (ref 70–99)
Glucose-Capillary: 175 mg/dL — ABNORMAL HIGH (ref 70–99)
Glucose-Capillary: 195 mg/dL — ABNORMAL HIGH (ref 70–99)
Glucose-Capillary: 210 mg/dL — ABNORMAL HIGH (ref 70–99)
Glucose-Capillary: 227 mg/dL — ABNORMAL HIGH (ref 70–99)
Glucose-Capillary: 294 mg/dL — ABNORMAL HIGH (ref 70–99)
Glucose-Capillary: 359 mg/dL — ABNORMAL HIGH (ref 70–99)
Glucose-Capillary: 88 mg/dL (ref 70–99)

## 2020-08-04 LAB — PHENOBARBITAL LEVEL: Phenobarbital: 12.9 ug/mL — ABNORMAL LOW (ref 15.0–30.0)

## 2020-08-04 LAB — PHOSPHORUS: Phosphorus: 11.5 mg/dL — ABNORMAL HIGH (ref 2.5–4.6)

## 2020-08-04 LAB — AMYLASE: Amylase: 69 U/L (ref 28–100)

## 2020-08-04 LAB — CARBAMAZEPINE LEVEL, TOTAL: Carbamazepine Lvl: 5.1 ug/mL (ref 4.0–12.0)

## 2020-08-04 LAB — CORTISOL: Cortisol, Plasma: 11.8 ug/dL

## 2020-08-04 MED ORDER — DARBEPOETIN ALFA 60 MCG/0.3ML IJ SOSY
PREFILLED_SYRINGE | INTRAMUSCULAR | Status: AC
  Administered 2020-08-04: 60 ug via INTRAVENOUS
  Filled 2020-08-04: qty 0.3

## 2020-08-04 MED ORDER — CHLORHEXIDINE GLUCONATE CLOTH 2 % EX PADS
6.0000 | MEDICATED_PAD | Freq: Every day | CUTANEOUS | Status: DC
  Administered 2020-08-04 (×2): 6 via TOPICAL

## 2020-08-04 MED ORDER — DARBEPOETIN ALFA 60 MCG/0.3ML IJ SOSY: 60.0000 ug | PREFILLED_SYRINGE | Freq: Once | INTRAMUSCULAR | Status: AC

## 2020-08-04 MED ORDER — HEPARIN SODIUM (PORCINE) 5000 UNIT/ML IJ SOLN
5000.0000 [IU] | Freq: Three times a day (TID) | INTRAMUSCULAR | Status: DC
  Administered 2020-08-04 – 2020-08-05 (×5): 5000 [IU] via SUBCUTANEOUS
  Filled 2020-08-04 (×4): qty 1

## 2020-08-04 MED ORDER — SODIUM CHLORIDE 0.9 % IV SOLN
2.0000 g | Freq: Once | INTRAVENOUS | Status: AC
  Administered 2020-08-04: 2 g via INTRAVENOUS
  Filled 2020-08-04: qty 2

## 2020-08-04 MED ORDER — DOCUSATE SODIUM 100 MG PO CAPS: 100.0000 mg | ORAL_CAPSULE | Freq: Two times a day (BID) | ORAL | Status: DC | PRN

## 2020-08-04 MED ORDER — INSULIN GLARGINE 100 UNIT/ML ~~LOC~~ SOLN
30.0000 [IU] | Freq: Every day | SUBCUTANEOUS | Status: DC
  Administered 2020-08-04: 30 [IU] via SUBCUTANEOUS
  Filled 2020-08-04 (×2): qty 0.3

## 2020-08-04 MED ORDER — VANCOMYCIN VARIABLE DOSE PER UNSTABLE RENAL FUNCTION (PHARMACIST DOSING): Status: DC

## 2020-08-04 MED ORDER — SODIUM BICARBONATE 8.4 % IV SOLN
50.0000 meq | Freq: Once | INTRAVENOUS | Status: AC
  Administered 2020-08-04: 50 meq via INTRAVENOUS
  Filled 2020-08-04: qty 50

## 2020-08-04 MED ORDER — VANCOMYCIN HCL IN DEXTROSE 1-5 GM/200ML-% IV SOLN
1000.0000 mg | Freq: Once | INTRAVENOUS | Status: AC
  Administered 2020-08-04: 1000 mg via INTRAVENOUS
  Filled 2020-08-04: qty 200

## 2020-08-04 MED ORDER — SODIUM ZIRCONIUM CYCLOSILICATE 10 G PO PACK
10.0000 g | PACK | Freq: Once | ORAL | Status: AC
  Administered 2020-08-04: 10 g via ORAL
  Filled 2020-08-04: qty 1

## 2020-08-04 MED ORDER — DEXTROSE 50 % IV SOLN
0.0000 mL | INTRAVENOUS | Status: DC | PRN
Start: 2020-08-04 — End: 2020-08-04

## 2020-08-04 MED ORDER — CARBAMAZEPINE ER 200 MG PO TB12
900.0000 mg | ORAL_TABLET | Freq: Two times a day (BID) | ORAL | Status: DC
  Administered 2020-08-04 – 2020-08-05 (×3): 900 mg via ORAL
  Filled 2020-08-04 (×3): qty 1

## 2020-08-04 MED ORDER — SODIUM CHLORIDE 0.9 % IV SOLN
1.0000 g | INTRAVENOUS | Status: DC
  Filled 2020-08-04: qty 1

## 2020-08-04 MED ORDER — POLYETHYLENE GLYCOL 3350 17 G PO PACK: 17.0000 g | PACK | Freq: Every day | ORAL | Status: DC | PRN

## 2020-08-04 MED ORDER — PANTOPRAZOLE SODIUM 40 MG IV SOLR
40.0000 mg | Freq: Every day | INTRAVENOUS | Status: DC
  Administered 2020-08-04: 40 mg via INTRAVENOUS
  Filled 2020-08-04 (×2): qty 40

## 2020-08-04 MED ORDER — INSULIN ASPART 100 UNIT/ML IJ SOLN
1.0000 [IU] | INTRAMUSCULAR | Status: DC
Start: 2020-08-04 — End: 2020-08-05
  Administered 2020-08-04: 3 [IU] via SUBCUTANEOUS

## 2020-08-04 MED ORDER — HEPARIN SODIUM (PORCINE) 1000 UNIT/ML DIALYSIS: 1500.0000 [IU] | Freq: Once | INTRAMUSCULAR | Status: DC

## 2020-08-04 MED ORDER — CALCIUM GLUCONATE 10 % IV SOLN
1.0000 g | Freq: Once | INTRAVENOUS | Status: AC
  Administered 2020-08-04: 1 g via INTRAVENOUS
  Filled 2020-08-04: qty 10

## 2020-08-04 MED ORDER — ACETAMINOPHEN 325 MG PO TABS
650.0000 mg | ORAL_TABLET | Freq: Four times a day (QID) | ORAL | Status: DC | PRN
  Administered 2020-08-04 – 2020-08-05 (×2): 650 mg via ORAL
  Filled 2020-08-04 (×2): qty 2

## 2020-08-04 MED ORDER — LACTATED RINGERS IV BOLUS
20.0000 mL/kg | Freq: Once | INTRAVENOUS | Status: AC
  Administered 2020-08-04: 2072 mL via INTRAVENOUS

## 2020-08-04 MED ORDER — DEXTROSE IN LACTATED RINGERS 5 % IV SOLN: INTRAVENOUS | Status: DC

## 2020-08-04 MED ORDER — INSULIN REGULAR(HUMAN) IN NACL 100-0.9 UT/100ML-% IV SOLN
INTRAVENOUS | Status: DC
  Administered 2020-08-04: 11.5 [IU]/h via INTRAVENOUS
  Filled 2020-08-04: qty 100

## 2020-08-04 MED ORDER — LACTATED RINGERS IV SOLN: INTRAVENOUS | Status: DC

## 2020-08-04 MED ORDER — DEXTROSE 50 % IV SOLN: 0.0000 mL | INTRAVENOUS | Status: DC | PRN

## 2020-08-04 MED ORDER — INSULIN ASPART 100 UNIT/ML IJ SOLN
10.0000 [IU] | Freq: Once | INTRAMUSCULAR | Status: AC
  Administered 2020-08-04: 10 [IU] via INTRAVENOUS

## 2020-08-04 MED ORDER — PHENOBARBITAL 32.4 MG PO TABS
259.2000 mg | ORAL_TABLET | Freq: Every day | ORAL | Status: DC
  Administered 2020-08-04 – 2020-08-05 (×2): 259.2 mg via ORAL
  Filled 2020-08-04 (×2): qty 8

## 2020-08-04 MED ORDER — ENOXAPARIN SODIUM 40 MG/0.4ML IJ SOSY
40.0000 mg | PREFILLED_SYRINGE | INTRAMUSCULAR | Status: DC
  Administered 2020-08-04: 40 mg via SUBCUTANEOUS
  Filled 2020-08-04: qty 0.4

## 2020-08-04 MED ORDER — VANCOMYCIN HCL 2000 MG/400ML IV SOLN
2000.0000 mg | Freq: Once | INTRAVENOUS | Status: AC
  Administered 2020-08-04: 2000 mg via INTRAVENOUS
  Filled 2020-08-04: qty 400

## 2020-08-04 NOTE — Consult Note (Signed)
Renal Service Consult Note [facility]  Eddie Davies 08/04/2020 Sol Blazing, MD Requesting Physician: Dr. Carlis Abbott, Carle[facility] Jews.   Reason for Consult: ESRD pt w/ AMS, missed HD HPI: The patient is a 50 y.o. year-old presented to ED from SNF in [facility] for eval of N/V.  Last HD was 1 wk prior.  Pt c/o being tired and legs hurting. Pt is WC bound. In ED BS was 758 and K 6.9, he was rx'd w/ IV Ca, 10u IV insulin and 1 amp bicarb and started on insulin infusion.  Asked to see for dialysis.   Pt lying in bed in ED, lethargic, doesn't given any history.    ROS - n/a   Past Medical History  Past Medical History:  Diagnosis Date   Anxiety disorder, unspecified    Cirrhosis ([place]) 04/2018   Diabetes mellitus without complication ([place])    type II   Enterocolitis due to Clostridium difficile, not specified as recurrent 08/09/2018   admission   Epilepsy, unspecified, not intractable, without status epilepticus ([place])    ESRD (end stage renal disease) ([place])    MWF    Gout, unspecified    Heart failure, unspecified ([place])    Hypertension    Klebsiella pneumoniae (k. pneumoniae) as the cause of diseases classified elsewhere 06/25/2018   admission   Liver failure ([place])    Major depressive disorder, single episode, unspecified    Metabolic acidosis 78/2956   Pneumonia    Polyneuropathy, unspecified    Tobacco use    Vitamin D deficiency    Past Surgical History  Past Surgical History:  Procedure Laterality Date   APPENDECTOMY  1981   AV FISTULA PLACEMENT Right 10/23/2018   Procedure: ARTERIOVENOUS (AV) FISTULA CREATION RIGHT ARM;  Surgeon: [name], MD;  Location: [place];  Service: Vascular;  Laterality: Right;   CHOLECYSTECTOMY     HERNIA REPAIR  2007   IR FLUORO GUIDE CV LINE RIGHT  05/30/2018   IR FLUORO GUIDE CV LINE RIGHT  03/18/2019   IR FLUORO GUIDE CV LINE RIGHT  03/22/2019   IR REMOVAL TUN CV CATH W/O FL  03/15/2019   IR SINUS/FIST TUBE CHK-NON GI   04/04/2019   IR US GUIDE VASC ACCESS RIGHT  05/30/2018   IR US GUIDE VASC ACCESS RIGHT  03/18/2019   IR US GUIDE VASC ACCESS RIGHT  03/22/2019   NEPHRECTOMY Left 2020   Family History  Family History  Problem Relation Age of Onset   Seizures Neg Hx    Social History  reports that he has been smoking cigarettes. He has been smoking an average of 0.25 packs per day. He has never used smokeless tobacco. He reports previous alcohol use. He reports that he does not use drugs. Allergies  Allergies  Allergen Reactions   Fish Oil Swelling and Other (See Comments)        Ethyl Acetate    Home medications Prior to Admission medications   Medication Sig Start Date End Date Taking? Authorizing Provider  acetaminophen (TYLENOL) 325 MG tablet Take 650 mg by mouth every 8 (eight) hours as needed for fever (pain).   Yes [provider]  allopurinol (ZYLOPRIM) 100 MG tablet Take 100 mg by mouth daily.  08/01/18  Yes [provider]  Amino Acids-Protein Hydrolys (FEEDING SUPPLEMENT, PRO-STAT 64,) LIQD Take 30 mLs by mouth in the morning and at bedtime.   Yes [provider]  calcium carbonate (TUMS - DOSED IN MG ELEMENTAL  CALCIUM) 500 MG chewable tablet Chew 2,000 mg by mouth as directed. Give 4 tablet (2000 mg) TID & Give 5 tablets (2500 mg) on MWF 07/19/19  Yes [provider]  carbamazepine (TEGRETOL XR) 100 MG 12 hr tablet Take 100 mg by mouth 2 (two) times daily.   Yes [provider]  carbamazepine (TEGRETOL XR) 400 MG 12 hr tablet Take 800 mg by mouth 2 (two) times daily.   Yes [provider]  cyclobenzaprine (FLEXERIL) 5 MG tablet Take 5 mg by mouth daily as needed for muscle spasms. 12/02/19  Yes [provider]  DULoxetine (CYMBALTA) 20 MG capsule Take 20 mg by mouth daily.   Yes [provider]  HYDROcodone-acetaminophen (NORCO) 5-325 MG tablet Take 1 tablet by mouth every 6 (six) hours as needed for moderate pain. 12/05/18  Yes  [place] Eric J, DO  insulin aspart (NOVOLOG) 100 UNIT/ML injection Inject 0-15 Units into the skin as directed. Inject 5 units daily with meals TID & Inject TID Per Sliding Scale with meals If 121-200=2 units, 201-250=5 units, 251-300=8 units, 301-350=11 units, 351-400=15 units >400 call MD   Yes [provider]  Insulin Glargine (BASAGLAR KWIKPEN) 100 UNIT/ML Inject 30 Units into the skin at bedtime.   Yes [provider]  Ipratropium-Albuterol (COMBIVENT) 20-100 MCG/ACT AERS respimat Inhale 1 puff into the lungs every 6 (six) hours as needed for wheezing.   Yes [provider]  ketoconazole (NIZORAL) 2 % shampoo Apply 1 application topically See admin instructions. APPLY TO SCALP (SHAMPOO) TOPICALLY EVERY DAY SHIFT EVERY TUE, FRI, SUN FOR DRY SCALP (BATH DAYS) APPLY TO SCALP 3X A WEEK & RINSE. 04/11/19  Yes [provider]  lactulose (CHRONULAC) 10 GM/15ML solution Take 15 mLs (10 g total) by mouth 2 (two) times daily. 05/31/18  Yes [name], [name] K, MD  lidocaine-prilocaine (EMLA) cream Apply 1 application topically every Monday, Wednesday, and Friday.   Yes [provider]  Multiple Vitamin (MULTIVITAMIN WITH MINERALS) TABS tablet Take 1 tablet by mouth at bedtime.   Yes [provider]  Nutritional Supplements (NEPRO PO) Take 1 Bottle by mouth at bedtime.   Yes [provider]  omeprazole (PRILOSEC) 20 MG capsule Take 20 mg by mouth daily.  07/04/18  Yes [provider]  ondansetron (ZOFRAN) 4 MG tablet Take 4 mg by mouth every 8 (eight) hours as needed for nausea or vomiting.  08/03/18  Yes [provider]  PHENobarbital (LUMINAL) 64.8 MG tablet Take 4 tablets (259.2 mg total) by mouth at bedtime. 12/05/18 08/04/20 Yes [place] Eric J, DO  polyethylene glycol (MIRALAX / GLYCOLAX) 17 g packet Take 17 g by mouth daily as needed. Patient taking differently: Take 17 g by mouth daily as needed for moderate constipation. 03/23/19   Yes Sheikh, Omair Latif, DO  promethazine (PHENERGAN) 25 MG/ML injection Inject 25 mg into the muscle every 6 (six) hours as needed for nausea or vomiting.   Yes [provider]  simvastatin (ZOCOR) 20 MG tablet Take 20 mg by mouth at bedtime.    Yes [provider]  tamsulosin (FLOMAX) 0.4 MG CAPS capsule Take 0.4 mg by mouth at bedtime.   Yes [provider]  TRADJENTA 5 MG TABS tablet Take 5 mg by mouth daily. 08/19/19  Yes [provider]  Insulin Glargine (BASAGLAR KWIKPEN) 100 UNIT/ML SOPN Inject 0.28 mLs (28 Units total) into the skin at bedtime. Patient taking differently: Inject 35 Units into the skin at bedtime. 12/05/18  08/26/19  [place] Donnamarie Poag, DO  penicillin v potassium (VEETID) 500 MG tablet Take 500 mg by mouth 4 (four) times daily. 01/23/20   [provider]     Vitals:   08/04/20 1100 08/04/20 1115 08/04/20 1130 08/04/20 1145  BP: (!) 152/85 (!) 148/78 (!) 142/86 136/86  Pulse: 80 79 76 72  Resp: (!) 21 12 14 13   Temp:      TempSrc:      SpO2: 99% 96% 99% 99%  Weight:       Exam Gen lethargic No rash, cyanosis or gangrene Sclera anicteric, throat clear  No jvd or bruits Chest clear bilat to bases, no rales/ wheezing RRR no MRG Abd soft ntnd no mass or ascites +bs GU normal MS no joint effusions or deformity Ext no LE or UE edema, scaly skin over the legs Neuro is lethargic  RUA AVF+bruit       OP HD: [facility] MWF  4h 65mn  400/500  103kg  2K/3Ca bath  RUA AVF  Hep 1500  - last hd 7/6  - hect 3 ug IV tiw  - mircera 75 q2 last on 6/29   Assessment/ Plan: Hyperkalemia - severe, improving w/ insulin and other temporizing measures.  AMS - prob uremia +/- uncont DM/ acidemia Uncont hyperglycemia - in pt w/ DM2, on insulin infusion now Metabolic acidosis - extreme acidemia due to missed HD and uncont DM2 ESRD - HD MWF. Sig azotemia w/ missed HD x 2-3. Will plan for HD today.  Anemia ckd - Hb 8-10 here, due for esa  will dose today. Follow.  MBD ckd - cont vdra w hd N/V - for 1 wk, per pmd. Covid neg.       Rob Jahara Dail  MD 08/04/2020, 12:20 PM  Recent Labs  Lab 08/04/20 0800 08/04/20 0844 08/04/20 0955 08/04/20 1135  WBC 6.3  --  6.0  --   HGB 9.9*   < > 10.0* 8.8*   < > = values in this interval not displayed.   Recent Labs  Lab 08/04/20 0800 08/04/20 0844 08/04/20 0933 08/04/20 1135  K 6.8* 6.9* 6.8* 4.6  BUN 73* 91*  --   --   CREATININE 12.61* 13.80*  --   --   CALCIUM 6.7*  --   --   --

## 2020-08-04 NOTE — Progress Notes (Signed)
[facility] Progress Note Patient Name: Eddie Davies DOB: 1970/06/13 MRN: [id number]   Date of Service  08/04/2020  HPI/Events of Note  Patient is now taking PO.  eICU Interventions  D 5 % LR gtt discontinued.        Kerry Kass [name] 08/04/2020, 10:22 PM

## 2020-08-04 NOTE — Progress Notes (Signed)
IVT consulted for difficult PIV placement at [phone].  IV obtained was also documented at 0842.  Called primary RN, Alana who confirmed pt has an IV.  Advised IVT will clear the consult.

## 2020-08-04 NOTE — ED Provider Notes (Addendum)
[facility] EMERGENCY DEPARTMENT Provider Note   CSN: [id number] Arrival date & time: 08/04/20  0746     History Chief Complaint  Patient presents with   Altered Mental Status    Wacey Zieger is a 50 y.o. male.  Patient from a nursing facility in [place].  Came in by PTR.  Apparently patient has not been to dialysis in a week.  He has an AV fistula in his right upper extremity.  Patient states he has had nausea vomiting diarrhea.  They brought him in today for altered mental status with patient's that is concerned that he had weakness in both legs.  Denies any pain.  It appears patient normally dialyzed Monday Wednesdays Fridays.  Past medical history significant for type 2 diabetes cirrhosis epilepsy on Tegretol hypertension liver failure.  Patient's cardiac monitoring upon arrival had a widened QRS complex very concerning for hyper kalemia.  Patient arrived without an IV.  We struggled a little bit to get IV access but now we have 2 IVs in the left upper extremity.  Without knowing his electrolytes had ordered 10 units of IV insulin we knew the blood sugar was high greater than 600.  Presumed that the potassium was high.  Ordered an amp of bicarb and calcium gluconate and Lokelma.  In addition patient has a chronic scaly skin condition.      Past Medical History:  Diagnosis Date   Anxiety disorder, unspecified    Cirrhosis ([place]) 04/2018   Diabetes mellitus without complication ([place])    type II   Enterocolitis due to Clostridium difficile, not specified as recurrent 08/09/2018   admission   Epilepsy, unspecified, not intractable, without status epilepticus ([place])    ESRD (end stage renal disease) ([place])    MWF    Gout, unspecified    Heart failure, unspecified ([place])    Hypertension    Klebsiella pneumoniae (k. pneumoniae) as the cause of diseases classified elsewhere 06/25/2018   admission   Liver failure ([place])    Major depressive disorder, single  episode, unspecified    Metabolic acidosis 76/7341   Pneumonia    Polyneuropathy, unspecified    Tobacco use    Vitamin D deficiency     Patient Active Problem List   Diagnosis Date Noted   Hypertension    Diabetes mellitus without complication ([place])    Tobacco use    Polyneuropathy, unspecified    Pneumonia    Major depressive disorder, single episode, unspecified    Liver failure ([place])    Heart failure, unspecified ([place])    Gout, unspecified    ESRD (end stage renal disease) ([place])    Epilepsy, unspecified, not intractable, without status epilepticus ([place])    Anxiety disorder, unspecified    Chest pain, unspecified 01/27/2020   Other fluid overload 04/10/2019   Allergy, unspecified, initial encounter 04/06/2019   Retroperitoneal abscess ([place]) 03/13/2019   Infection due to ESBL-producing Klebsiella pneumoniae 03/13/2019   Personal history of COVID-19 02/25/2019   Postprocedural hematoma of a genitourinary system organ or structure following a genitourinary system procedure 02/14/2019   Coagulopathy ([place]) 02/06/2019   Failure to thrive in adult 01/21/2019   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   Protein-calorie malnutrition, severe ([place]) 01/21/2019   End stage liver disease ([place]) 01/09/2019   COVID-19 virus infection 01/09/2019   History of ESBL Klebsiella pneumoniae infection 01/09/2019   Septic shock ([place]) 01/09/2019   ESRD on hemodialysis ([place]) 12/27/2018   Gram negative sepsis ([place]) 12/27/2018  History of Clostridioides difficile colitis 12/27/2018   Anemia in chronic kidney disease 08/28/2018   Secondary hyperparathyroidism of renal origin ([place]) 08/28/2018   Enterocolitis due to Clostridium difficile, not specified as recurrent 08/09/2018   Pain, unspecified 07/09/2018   Klebsiella pneumoniae (k. pneumoniae) as the cause of diseases classified elsewhere 06/25/2018   Bacteremia due to Klebsiella pneumoniae 06/19/2018   Fever 06/18/2018   Anemia in ESRD  (end-stage renal disease) ([place]) 06/18/2018   Anemia due to chronic kidney disease 06/18/2018   Leukocytosis 06/18/2018   Diarrhea, unspecified 06/08/2018   Dyspnea, unspecified 06/01/2018   Encounter for screening for respiratory tuberculosis 06/01/2018   Other cirrhosis of liver ([place]) 06/01/2018   Seizures ([place]) 05/24/2018   Benign prostatic hyperplasia without lower urinary tract symptoms 05/24/2018   Sepsis ([place]) 05/24/2018   Benign essential HTN 05/24/2018   Thrombocytopenia ([place]) 14/48/1856   Metabolic acidosis 31/49/7026   Metabolic bone disease 37/85/8850   Iron deficiency anemia 05/11/2018   Vitamin D deficiency 05/11/2018   Cirrhosis ([place]) 04/2018   Respiratory failure ([place])    Pressure injury of skin 04/21/2018   Altered mental status, unspecified 04/21/2018   HCAP (healthcare-associated pneumonia) 04/21/2018   Anemia due to chronic kidney disease, on chronic dialysis ([place]) 04/07/2018   Closed fracture of left upper extremity with routine healing 03/20/2018   Difficult intravenous access 03/16/2018   Delirium 03/14/2018   Hyperammonemia ([place]) 03/14/2018   Hypocalcemia 03/08/2018   Urinary calculus, unspecified 03/08/2018   Physical deconditioning 03/08/2018   Pressure injury of lower back, stage 2 ([name]) 03/08/2018   HTN (hypertension) 03/08/2018   Other hypoglycemia 03/08/2018   Closed intertrochanteric fracture of left femur ([place]) 01/21/2018   Fall 01/21/2018   Fracture of left ulna 01/21/2018   Fracture of left tibia 01/21/2018   Trauma 01/21/2018   Diabetic ulcer of toe associated with type 2 diabetes mellitus, limited to breakdown of skin ([place]) 02/14/2017   Hyperosmolar non-ketotic state in patient with type 2 diabetes mellitus ([place]) 02/14/2017   Anemia 12/08/2016   Diabetic ketoacidosis ([place]) 12/08/2016   GERD (gastroesophageal reflux disease) 12/08/2016   Hyponatremia 12/08/2016   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   Open wound of left foot except toes with  complication 27/74/1287   Hyperlipidemia 06/22/2016   Type 2 diabetes mellitus with unspecified complications ([place]) 86/76/7209   Benign hypertension with chronic kidney disease, stage III ([place]) 06/22/2016   Abscess of scrotum 09/19/2014   Cellulitis of toe of right foot 09/19/2014   Axillary abscess 02/03/2014   Hypokalemia 02/03/2014    Past Surgical History:  Procedure Laterality Date   APPENDECTOMY  1981   AV FISTULA PLACEMENT Right 10/23/2018   Procedure: ARTERIOVENOUS (AV) FISTULA CREATION RIGHT ARM;  Surgeon: [name], MD;  Location: [facility];  Service: Vascular;  Laterality: Right;   CHOLECYSTECTOMY     HERNIA REPAIR  2007   IR FLUORO GUIDE CV LINE RIGHT  05/30/2018   IR FLUORO GUIDE CV LINE RIGHT  03/18/2019   IR FLUORO GUIDE CV LINE RIGHT  03/22/2019   IR REMOVAL TUN CV CATH W/O FL  03/15/2019   IR SINUS/FIST TUBE CHK-NON GI  04/04/2019   IR US GUIDE VASC ACCESS RIGHT  05/30/2018   IR US GUIDE VASC ACCESS RIGHT  03/18/2019   IR US GUIDE VASC ACCESS RIGHT  03/22/2019   NEPHRECTOMY Left 2020       Family History  Problem Relation Age of Onset   Seizures Neg Hx  Social History   Tobacco Use   Smoking status: Some Days    Packs/day: 0.25    Pack years: 0.00    Types: Cigarettes   Smokeless tobacco: Never  Vaping Use   Vaping Use: Never used  Substance Use Topics   Alcohol use: Not Currently   Drug use: Never    Home Medications Prior to Admission medications   Medication Sig Start Date End Date Taking? Authorizing Provider  acetaminophen (TYLENOL) 325 MG tablet Take 650 mg by mouth every 8 (eight) hours as needed for fever (pain).    [provider]  allopurinol (ZYLOPRIM) 100 MG tablet Take 100 mg by mouth daily.  08/01/18   [provider]  Amino Acids-Protein Hydrolys (FEEDING SUPPLEMENT, PRO-STAT 64,) LIQD Take 30 mLs by mouth in the morning and at bedtime.    [provider]  aspirin (BAYER LOW DOSE) 81 MG EC tablet Take by  mouth. 02/15/20   [provider]  calcium carbonate (OS-CAL - DOSED IN MG OF ELEMENTAL CALCIUM) 1250 (500 Ca) MG tablet Take by mouth. 01/10/20   [provider]  calcium carbonate (TUMS - DOSED IN MG ELEMENTAL CALCIUM) 500 MG chewable tablet Chew 2,000 mg by mouth as directed. Give 4 tablet (2000 mg) TID & Give 5 tablets (2500 mg) on MWF 07/19/19   [provider]  carbamazepine (TEGRETOL XR) 100 MG 12 hr tablet Take 100 mg by mouth 2 (two) times daily.    [provider]  carbamazepine (TEGRETOL XR) 400 MG 12 hr tablet Take 800 mg by mouth 2 (two) times daily.    [provider]  chlorhexidine (PERIDEX) 0.12 % solution Use as directed 15 mLs in the mouth or throat 2 (two) times daily. 01/23/20   [provider]  Cholecalciferol 125 MCG (5000 UT) TABS Take by mouth. 03/07/20   [provider]  cyanocobalamin 1000 MCG tablet Take by mouth. 03/07/20   [provider]  cyclobenzaprine (FLEXERIL) 5 MG tablet Take 5 mg by mouth daily as needed for muscle spasms. 12/02/19   [provider]  DULoxetine (CYMBALTA) 20 MG capsule Take 20 mg by mouth daily.    [provider]  guaiFENesin (MUCINEX) 600 MG 12 hr tablet Take 600 mg by mouth every 12 (twelve) hours as needed for cough (congestion).    [provider]  HYDROcodone-acetaminophen (NORCO) 5-325 MG tablet Take 1 tablet by mouth every 6 (six) hours as needed for moderate pain. 12/05/18   [place] Eric J, DO  insulin aspart (NOVOLOG) 100 UNIT/ML injection Inject 0-15 Units into the skin as directed. Inject 5 units daily with meals TID & Inject TID Per Sliding Scale with meals If 121-200=2 units, 201-250=5 units, 251-300=8 units, 301-350=11 units, 351-400=15 units >400 call MD    [provider]  Insulin Glargine (BASAGLAR KWIKPEN) 100 UNIT/ML SOPN Inject 0.28 mLs (28 Units total) into the skin at bedtime. Patient taking differently: Inject 35 Units into  the skin at bedtime. 12/05/18 08/26/19  [place] Donnamarie Poag, DO  Insulin Glargine (BASAGLAR KWIKPEN) 100 UNIT/ML Inject 35 Units into the skin at bedtime.    [provider]  Ipratropium-Albuterol (COMBIVENT) 20-100 MCG/ACT AERS respimat Inhale 1 puff into the lungs every 6 (six) hours as needed for wheezing.    [provider]  ketoconazole (NIZORAL) 2 % shampoo Apply 1 application topically See admin instructions. APPLY TO SCALP (SHAMPOO) TOPICALLY EVERY DAY SHIFT EVERY TUE, FRI, SUN FOR DRY SCALP (BATH DAYS) APPLY  TO SCALP 3X A WEEK & RINSE. 04/11/19   [provider]  lactulose (CHRONULAC) 10 GM/15ML solution Take 15 mLs (10 g total) by mouth 2 (two) times daily. 05/31/18   [name], [name] K, MD  Lidocaine 3 % CREA Apply 1 application topically 3 (three) times a week. MWF    [provider]  lidocaine-prilocaine (EMLA) cream Apply 1 application topically every Monday, Wednesday, and Friday.    [provider]  Methoxy PEG-Epoetin Beta (MIRCERA IJ) Mircera 01/08/20 01/06/21  [provider]  Multiple Vitamin (MULTIVITAMIN WITH MINERALS) TABS tablet Take 1 tablet by mouth at bedtime.    [provider]  Nutritional Supplements (NEPRO PO) Take 1 Bottle by mouth at bedtime.    [provider]  omeprazole (PRILOSEC) 20 MG capsule Take 20 mg by mouth daily.  07/04/18   [provider]  ondansetron (ZOFRAN) 4 MG tablet Take 4 mg by mouth every 8 (eight) hours as needed for nausea or vomiting.  08/03/18   [provider]  penicillin v potassium (VEETID) 500 MG tablet Take 500 mg by mouth 4 (four) times daily. 01/23/20   [provider]  PHENobarbital (LUMINAL) 64.8 MG tablet Take 4 tablets (259.2 mg total) by mouth at bedtime. 12/05/18 08/26/19  [place] Donnamarie Poag, DO  PHENobarbital (LUMINAL) 64.8 MG tablet Take 259.2 mg by mouth at bedtime. Take 4 tablets (259.20 mg) at bedtime    [provider]  polyethylene  glycol (MIRALAX / GLYCOLAX) 17 g packet Take 17 g by mouth daily as needed. Patient taking differently: Take 17 g by mouth daily as needed for moderate constipation. 03/23/19   Raiford Noble Latif, DO  promethazine (PHENERGAN) 25 MG/ML injection Inject 25 mg into the muscle every 6 (six) hours as needed for nausea or vomiting.    [provider]  simvastatin (ZOCOR) 20 MG tablet Take 20 mg by mouth at bedtime.     [provider]  tamsulosin (FLOMAX) 0.4 MG CAPS capsule Take 0.4 mg by mouth at bedtime.    [provider]  TRADJENTA 5 MG TABS tablet Take 5 mg by mouth daily. 08/19/19   [provider]    Allergies    Fish oil and Ethyl acetate  Review of Systems   Review of Systems  Unable to perform ROS: Mental status change  Respiratory:  Negative for shortness of breath.   Cardiovascular:  Negative for chest pain.  Gastrointestinal:  Positive for diarrhea, nausea and vomiting.  Neurological:  Positive for weakness.  Psychiatric/Behavioral:  Positive for confusion.    Physical Exam Updated Vital Signs BP 131/75   Pulse 62   Temp 97.7 F (36.5 C) (Oral)   Resp 20   Wt 103.6 kg   SpO2 98%   BMI 29.32 kg/m   Physical Exam Vitals and nursing note reviewed.  Constitutional:      General: He is in acute distress.     Appearance: Normal appearance. He is well-developed. He is ill-appearing.  HENT:     Head: Normocephalic and atraumatic.     Mouth/Throat:     Mouth: Mucous membranes are dry.  Eyes:     Extraocular Movements: Extraocular movements intact.     Conjunctiva/sclera: Conjunctivae normal.     Pupils: Pupils are equal, round, and reactive to light.  Cardiovascular:     Rate and Rhythm: Normal rate and regular rhythm.     Heart sounds: No murmur heard. Pulmonary:     Effort: Pulmonary  effort is normal. No respiratory distress.     Breath sounds: Normal breath sounds.  Abdominal:     Palpations: Abdomen is soft.     Tenderness:  There is no abdominal tenderness.  Musculoskeletal:     Cervical back: Normal range of motion and neck supple. No rigidity.     Comments: AV fistula right upper extremity with good thrill  Skin:    General: Skin is warm and dry.     Findings: Rash present.     Comments: Scaly skin predominantly on lower extremities.  In the hands and forearm.  Neurological:     Mental Status: He is alert.     Comments: Some weakness in lower extremities left lower extremity greater than right.  Patient is awake seems somewhat drowsy will answer questions.  Will follow commands.  Patient able to verbalize.    ED Results / Procedures / Treatments   Labs (all labs ordered are listed, but only abnormal results are displayed) Labs Reviewed  CBG MONITORING, ED - Abnormal; Notable for the following components:      Result Value   Glucose-Capillary >600 (*)    All other components within normal limits  I-STAT CHEM 8, ED - Abnormal; Notable for the following components:   Sodium 125 (*)    Potassium 6.9 (*)    BUN 91 (*)    Creatinine, Ser 13.80 (*)    Glucose, Bld >700 (*)    Calcium, Ion 0.91 (*)    TCO2 12 (*)    Hemoglobin 10.2 (*)    HCT 30.0 (*)    All other components within normal limits  RESP PANEL BY RT-PCR (FLU A&B, COVID) ARPGX2  COMPREHENSIVE METABOLIC PANEL  CBC  BETA-HYDROXYBUTYRIC ACID  BETA-HYDROXYBUTYRIC ACID  URINALYSIS, ROUTINE W REFLEX MICROSCOPIC  CBG MONITORING, ED  CBG MONITORING, ED  I-STAT VENOUS BLOOD GAS, ED    EKG EKG Interpretation  Date/Time:  Tuesday August 04 2020 08:18:32 EDT Ventricular Rate:  57 PR Interval:    QRS Duration: 183 QT Interval:  506 QTC Calculation: 493 R Axis:     Text Interpretation: EASI Derived Leads Confirmed by Fredia Sorrow [phone]) on 08/04/2020 8:31:48 AM  Radiology DG Chest Port 1 View  Result Date: 08/04/2020 CLINICAL DATA:  50 year old male dialysis patient with altered mental status. Atrial fibrillation. EXAM: PORTABLE  CHEST 1 VIEW COMPARISON:  Chest radiographs 01/27/2020 and earlier. FINDINGS: Portable AP semi upright view at 0822 hours. Borderline to mild cardiomegaly. Other mediastinal contours are within normal limits. Visualized tracheal air column is within normal limits. Allowing for portable technique the lungs are clear. No pneumothorax or pleural effusion. No osseous abnormality identified. IMPRESSION: Borderline to mild cardiomegaly. No acute cardiopulmonary abnormality. Electronically Signed   By: Genevie Ann M.D.   On: 08/04/2020 08:31    Procedures Procedures   CRITICAL CARE Performed by: Fredia Sorrow Total critical care time: 95 minutes Critical care time was exclusive of separately billable procedures and treating other patients. Critical care was necessary to treat or prevent imminent or life-threatening deterioration. Critical care was time spent personally by me on the following activities: development of treatment plan with patient and/or surrogate as well as nursing, discussions with consultants, evaluation of patient's response to treatment, examination of patient, obtaining history from patient or surrogate, ordering and performing treatments and interventions, ordering and review of laboratory studies, ordering and review of radiographic studies, pulse oximetry and re-evaluation of patient's condition.  Medications Ordered in ED Medications  sodium  zirconium cyclosilicate (LOKELMA) packet 10 g (has no administration in time range)  lactated ringers bolus 2,072 mL (has no administration in time range)  lactated ringers infusion (has no administration in time range)  dextrose 5 % in lactated ringers infusion (has no administration in time range)  dextrose 50 % solution 0-50 mL (has no administration in time range)  calcium gluconate inj 10% (1 g) URGENT USE ONLY! (1 g Intravenous Given 08/04/20 0856)  sodium bicarbonate injection 50 mEq (50 mEq Intravenous Given 08/04/20 0854)  insulin  aspart (novoLOG) injection 10 Units (10 Units Intravenous Given 08/04/20 3614)    ED Course  I have reviewed the triage vital signs and the nursing notes.  Pertinent labs & imaging results that were available during my care of the patient were reviewed by me and considered in my medical decision making (see chart for details).    MDM Rules/Calculators/A&P                         As stated in the HPI even without knowing electrolytes this knowing that on the fingerstick blood sugar sugar was greater than 600 and his EKG with a widened QRS complex and patient not having dialysis for a week assumed hyperkalemia.  Treated with 1 g of calcium gluconate 1 amp of bicarb 10 units of IV regular insulin.  And gave 10 mg of Lokelma.  Nurse was able to get an IV in the hand and the IV nurse was able to get a larger IV in the more proximal aspect of the left upper extremity.  Hyperglycemic crisis protocol put in place.  Patient's chest x-ray shows no fluid overload.  Patient clinically looks dry.  But obviously is an end-stage renal dialysis patient.  I-STAT came back with a potassium of 6.9.  The CO2 was 12 glucose was greater than 700.  Sodium 125.  Consult put into nephrology patient will probably need emergent dialysis.  May very well require admission by critical care service.  Patient is from the [place] area.  Final Clinical Impression(s) / ED Diagnoses Final diagnoses:  Hyperkalemia  Hyperglycemia  ESRD on dialysis [facility])    Rx / DC Orders ED Discharge Orders     None        Fredia Sorrow, MD 08/04/20 4315  Addendum: Gust with Dr. Jonnie Finner nephrology.  He agrees patient will need dialysis.  But I am thinking patient probably needs admission to critical care ICU will talk to the intensivist.  Patient's rhythm seems improved after the interventions.  We will reissued another EKG.  Right now patient is receiving fluids will cut them short because we do not want to fluid overload him  but he does appear dry.  Venous blood gas is now back.  pH is 7.10 I did not have that when I was talking to critical care.  But I think this will cement ICU admission.  They are coming to see him.  Based on this we will give another amp of bicarb.    Fredia Sorrow, MD 08/04/20 4008    Fredia Sorrow, MD 08/04/20 [place], [place], MD 08/04/20 [phone]  Addendum: Repeat EKG shows some narrowing of the QRS now 152 compared to 186 on the first EKG.  As stated above we will give another amp of bicarb and will give another 1 g of calcium gluconate to protect the heart.  Renal team is here now critical care team says are  coming to see the patient.  I am fairly certain he will be a ICU admission because of the pH being so low.  Based on that nephrology will do dialysis in the ICU.    Fredia Sorrow, MD 08/04/20 [phone]

## 2020-08-04 NOTE — ED Triage Notes (Signed)
To ED via [facility] from [facility] in [facility]) for eval of n/v. Pt last dialysis was a week prior. Pt is alert and states he feels tired and his legs are hurting. Pt is wheelchair bound.

## 2020-08-04 NOTE — H&P (Signed)
See separate consult note on 08/04/20.  Julian Hy, DO 08/04/20 12:55 PM [facility]

## 2020-08-04 NOTE — Consult Note (Signed)
NAME:  Eddie Davies, MRN:  [id number], DOB:  [birth year], LOS: 0 ADMISSION DATE:  08/04/2020, CONSULTATION DATE: 08/04/2024 REFERRING MD: Emergency department physician, CHIEF COMPLAINT: Metabolic disarray hyperkalemia hyperglycemia  History of Present Illness:  Eddie Davies is a 50 year old well-known to our practice who resides in a nursing home in [place] is notable for end-stage renal disease, poorly controlled diabetes, seizures, history of COVID-19, cirrhosis of the liver, continued tobacco use, seizures, metabolic acidosis, benign prostatic hyperplasia, bacteremia with Klebsiella pneumonia, chronic anemia and was at his skilled nursing facility and aspirin [place] when he was transferred to [facility] sensibly for missing dialysis x2, potassium was noted to be 6.8, glucose was greater than 700 placed on insulin drip and for that fact he is the critical care service was asked to admit.  He will need urgent dialysis in the intensive care unit we will empirically treating for underlying infection, most likely he can go to stepdown unit within 24 hours.  Pertinent  Medical History   Past Medical History:  Diagnosis Date   Anxiety disorder, unspecified    Cirrhosis ([place]) 04/2018   Diabetes mellitus without complication ([place])    type II   Enterocolitis due to Clostridium difficile, not specified as recurrent 08/09/2018   admission   Epilepsy, unspecified, not intractable, without status epilepticus ([place])    ESRD (end stage renal disease) ([place])    MWF    Gout, unspecified    Heart failure, unspecified ([place])    Hypertension    Klebsiella pneumoniae (k. pneumoniae) as the cause of diseases classified elsewhere 06/25/2018   admission   Liver failure ([place])    Major depressive disorder, single episode, unspecified    Metabolic acidosis 25/6389   Pneumonia    Polyneuropathy, unspecified    Tobacco use    Vitamin D deficiency      Significant Hospital  Events: Including procedures, antibiotic start and stop dates in addition to other pertinent events     Interim History / Subjective:  Admitted after missed missing dialysis x2 and aspirin [place], glucose greater than 700, potassium greater than 6  Objective   Blood pressure (!) 141/80, pulse 79, temperature 97.7 F (36.5 C), temperature source Oral, resp. rate 11, weight 103.6 kg, SpO2 98 %.       No intake or output data in the 24 hours ending 08/04/20 1013 Filed Weights   08/04/20 0831  Weight: 103.6 kg    Examination: General: Ill-appearing male in no acute distress answers questions HENT: No JVD or lymphadenopathy is appreciated Lungs: Decreased breath sounds in the bases currently on room air Cardiovascular: Heart sounds are regular blood pressure is 140/76 Abdomen: Soft nontender Extremities:       Neuro: No focal defects dull affect GU: Anuretic seizure  Resolved Hospital Problem list     Assessment & Plan:  Metabolic disarray secondary to Dialysis x2 with potassium greater than 6 glucose greater than 700 He needs urgent dialysis on insulin drip therefore will need to go to the intensive care unit and be dialyzed in the unit He is status post temporizing agents for his potassium being elevated calcium IV insulin bicarb Monitor electrolytes post dialysis Empirical antimicrobial therapy due to chronic skin condition white count was noted to be 6  Hyperglycemia in the setting of poorly controlled diabetes and unable to attend dialysis. Insulin drip Sliding-scale insulin protocol ICU admission  History of seizures Continue analeptics  Chronic skin condition which is a  variant of psoriasis with multiple symptoms of infection therefore we will do empirical antimicrobial therapy Vancomycin and cefepime [place]  History of pyelonephritis and multiple abdominal infections Empirical antimicrobial therapy He does not improve with correction of his  electrolytes glucose consider further radiographic work-up of abdomen   History of anxiety disorder Medications as needed   Best Practice (right click and "Reselect all SmartList Selections" daily)   Diet/type: NPO w/ oral meds DVT prophylaxis: prophylactic heparin  GI prophylaxis: N/A Lines: N/A Foley:  N/A Code Status:  full code Last date of multidisciplinary goals of care discussion [tbd]  Labs   CBC: Recent Labs  Lab 08/04/20 0800 08/04/20 0844 08/04/20 0933  WBC 6.3  --   --   HGB 9.9* 10.2* 9.9*  HCT 31.3* 30.0* 29.0*  MCV 108.7*  --   --   PLT 76*  --   --     Basic Metabolic Panel: Recent Labs  Lab 08/04/20 0800 08/04/20 0844 08/04/20 0933  NA 124* 125* 125*  K 6.8* 6.9* 6.8*  CL 98 102  --   CO2 10*  --   --   GLUCOSE 758* >700*  --   BUN 73* 91*  --   CREATININE 12.61* 13.80*  --   CALCIUM 6.7*  --   --    GFR: Estimated Creatinine Clearance: 8.3 mL/min (A) (by C-G formula based on SCr of 13.8 mg/dL (H)). Recent Labs  Lab 08/04/20 0800  WBC 6.3    Liver Function Tests: Recent Labs  Lab 08/04/20 0800  AST 23  ALT 20  ALKPHOS 124  BILITOT 0.8  PROT 6.1*  ALBUMIN 2.8*   No results for input(s): LIPASE, AMYLASE in the last 168 hours. No results for input(s): AMMONIA in the last 168 hours.  ABG    Component Value Date/Time   PHART 7.043 (LL) 05/19/2018 1309   PCO2ART 35.3 05/19/2018 1309   PO2ART 74.0 (L) 05/19/2018 1309   HCO3 9.8 (L) 08/04/2020 0933   TCO2 11 (L) 08/04/2020 0933   ACIDBASEDEF 19.0 (H) 08/04/2020 0933   O2SAT 98.0 08/04/2020 0933     Coagulation Profile: No results for input(s): INR, PROTIME in the last 168 hours.  Cardiac Enzymes: No results for input(s): CKTOTAL, CKMB, CKMBINDEX, TROPONINI in the last 168 hours.  HbA1C: Hgb A1c MFr Bld  Date/Time Value Ref Range Status  03/12/2019 11:31 PM 6.5 (H) 4.8 - 5.6 % Final    Comment:    (NOTE) Pre diabetes:          5.7%-6.4% Diabetes:               >6.4% Glycemic control for   <7.0% adults with diabetes   06/19/2018 05:04 AM 7.2 (H) 4.8 - 5.6 % Final    Comment:    (NOTE) Pre diabetes:          5.7%-6.4% Diabetes:              >6.4% Glycemic control for   <7.0% adults with diabetes     CBG: Recent Labs  Lab 08/04/20 0757  GLUCAP >600*    Review of Systems:   10 point review of system taken, please see HPI for positives and negatives. Positive for nausea and vomiting, unable to attend dialysis missed 2 sessions.  Reports staff at his nursing home does not give his insulin in a timely fashion.  He does report he continues to smoke.  Denies alcohol use.  Past Medical History:  He,  has a past medical history of Anxiety disorder, unspecified, Cirrhosis ([place]) (04/2018), Diabetes mellitus without complication ([place]), Enterocolitis due to Clostridium difficile, not specified as recurrent (08/09/2018), Epilepsy, unspecified, not intractable, without status epilepticus ([place]), ESRD (end stage renal disease) ([place]), Gout, unspecified, Heart failure, unspecified ([place]), Hypertension, Klebsiella pneumoniae (k. pneumoniae) as the cause of diseases classified elsewhere (06/25/2018), Liver failure ([place]), Major depressive disorder, single episode, unspecified, Metabolic acidosis (12/4578), Pneumonia, Polyneuropathy, unspecified, Tobacco use, and Vitamin D deficiency.   Surgical History:   Past Surgical History:  Procedure Laterality Date   APPENDECTOMY  1981   AV FISTULA PLACEMENT Right 10/23/2018   Procedure: ARTERIOVENOUS (AV) FISTULA CREATION RIGHT ARM;  Surgeon: [name], MD;  Location: [facility] OR;  Service: Vascular;  Laterality: Right;   CHOLECYSTECTOMY     HERNIA REPAIR  2007   IR FLUORO GUIDE CV LINE RIGHT  05/30/2018   IR FLUORO GUIDE CV LINE RIGHT  03/18/2019   IR FLUORO GUIDE CV LINE RIGHT  03/22/2019   IR REMOVAL TUN CV CATH W/O FL  03/15/2019   IR SINUS/FIST TUBE CHK-NON GI  04/04/2019   IR US GUIDE VASC ACCESS RIGHT  05/30/2018    IR US GUIDE VASC ACCESS RIGHT  03/18/2019   IR US GUIDE VASC ACCESS RIGHT  03/22/2019   NEPHRECTOMY Left 2020     Social History:   reports that he has been smoking cigarettes. He has been smoking an average of 0.25 packs per day. He has never used smokeless tobacco. He reports previous alcohol use. He reports that he does not use drugs.   Family History:  His family history is negative for Seizures.   Allergies Allergies  Allergen Reactions   Fish Oil Swelling and Other (See Comments)        Ethyl Acetate      Home Medications  Prior to Admission medications   Medication Sig Start Date End Date Taking? Authorizing Provider  acetaminophen (TYLENOL) 325 MG tablet Take 650 mg by mouth every 8 (eight) hours as needed for fever (pain).   Yes [provider]  allopurinol (ZYLOPRIM) 100 MG tablet Take 100 mg by mouth daily.  08/01/18  Yes [provider]  Amino Acids-Protein Hydrolys (FEEDING SUPPLEMENT, PRO-STAT 64,) LIQD Take 30 mLs by mouth in the morning and at bedtime.   Yes [provider]  calcium carbonate (TUMS - DOSED IN MG ELEMENTAL CALCIUM) 500 MG chewable tablet Chew 2,000 mg by mouth as directed. Give 4 tablet (2000 mg) TID & Give 5 tablets (2500 mg) on MWF 07/19/19  Yes [provider]  carbamazepine (TEGRETOL XR) 100 MG 12 hr tablet Take 100 mg by mouth 2 (two) times daily.   Yes [provider]  carbamazepine (TEGRETOL XR) 400 MG 12 hr tablet Take 800 mg by mouth 2 (two) times daily.   Yes [provider]  cyclobenzaprine (FLEXERIL) 5 MG tablet Take 5 mg by mouth daily as needed for muscle spasms. 12/02/19  Yes [provider]  DULoxetine (CYMBALTA) 20 MG capsule Take 20 mg by mouth daily.   Yes [provider]  HYDROcodone-acetaminophen (NORCO) 5-325 MG tablet Take 1 tablet by mouth every 6 (six) hours as needed for moderate pain. 12/05/18  Yes [place] Eric J, DO  insulin aspart (NOVOLOG) 100 UNIT/ML  injection Inject 0-15 Units into the skin as directed. Inject 5 units daily with meals TID & Inject TID Per Sliding Scale with meals If 121-200=2 units, 201-250=5 units, 251-300=8 units, 301-350=11  units, 351-400=15 units >400 call MD   Yes [provider]  Insulin Glargine (BASAGLAR KWIKPEN) 100 UNIT/ML Inject 30 Units into the skin at bedtime.   Yes [provider]  Ipratropium-Albuterol (COMBIVENT) 20-100 MCG/ACT AERS respimat Inhale 1 puff into the lungs every 6 (six) hours as needed for wheezing.   Yes [provider]  ketoconazole (NIZORAL) 2 % shampoo Apply 1 application topically See admin instructions. APPLY TO SCALP (SHAMPOO) TOPICALLY EVERY DAY SHIFT EVERY TUE, FRI, SUN FOR DRY SCALP (BATH DAYS) APPLY TO SCALP 3X A WEEK & RINSE. 04/11/19  Yes [provider]  lactulose (CHRONULAC) 10 GM/15ML solution Take 15 mLs (10 g total) by mouth 2 (two) times daily. 05/31/18  Yes [name], [name] K, MD  lidocaine-prilocaine (EMLA) cream Apply 1 application topically every Monday, Wednesday, and Friday.   Yes [provider]  Multiple Vitamin (MULTIVITAMIN WITH MINERALS) TABS tablet Take 1 tablet by mouth at bedtime.   Yes [provider]  Nutritional Supplements (NEPRO PO) Take 1 Bottle by mouth at bedtime.   Yes [provider]  omeprazole (PRILOSEC) 20 MG capsule Take 20 mg by mouth daily.  07/04/18  Yes [provider]  ondansetron (ZOFRAN) 4 MG tablet Take 4 mg by mouth every 8 (eight) hours as needed for nausea or vomiting.  08/03/18  Yes [provider]  PHENobarbital (LUMINAL) 64.8 MG tablet Take 4 tablets (259.2 mg total) by mouth at bedtime. 12/05/18 08/04/20 Yes [place] Eric J, DO  polyethylene glycol (MIRALAX / GLYCOLAX) 17 g packet Take 17 g by mouth daily as needed. Patient taking differently: Take 17 g by mouth daily as needed for moderate constipation. 03/23/19  Yes Sheikh, Omair Latif, DO  promethazine (PHENERGAN) 25  MG/ML injection Inject 25 mg into the muscle every 6 (six) hours as needed for nausea or vomiting.   Yes [provider]  simvastatin (ZOCOR) 20 MG tablet Take 20 mg by mouth at bedtime.    Yes [provider]  tamsulosin (FLOMAX) 0.4 MG CAPS capsule Take 0.4 mg by mouth at bedtime.   Yes [provider]  TRADJENTA 5 MG TABS tablet Take 5 mg by mouth daily. 08/19/19  Yes [provider]  Insulin Glargine (BASAGLAR KWIKPEN) 100 UNIT/ML SOPN Inject 0.28 mLs (28 Units total) into the skin at bedtime. Patient taking differently: Inject 35 Units into the skin at bedtime. 12/05/18 08/26/19  [place] Eric J, DO  penicillin v potassium (VEETID) 500 MG tablet Take 500 mg by mouth 4 (four) times daily. 01/23/20   [provider]     Critical care time: [facility] Nurse Practitioner [place] Please consult Amion 08/04/2020, 10:13 AM

## 2020-08-04 NOTE — Progress Notes (Signed)
Pharmacy Antibiotic Note  Eddie Davies is a 50 y.o. male admitted on 08/04/2020 with pneumonia and cellulitis.  Pharmacy has been consulted for vancomyin and cefepime dosing. Patient is ESRD on HD MWF, reported missing last two outpatient sessions. Plan to [facility] today.  Plan: Continue cefepime 1g IV q24h  Vancomycin 1053m x1 tonight after iHD  Continue to monitor iHD schedule and administer vancomycin 10032mwith iHD PreHD vancomycin levels as appropriate  Weight: 103.6 kg (228 lb 6.3 oz)  Temp (24hrs), Avg:97.7 F (36.5 C), Min:97.7 F (36.5 C), Max:97.7 F (36.5 C)  Recent Labs  Lab 08/04/20 0800 08/04/20 0844 08/04/20 0955  WBC 6.3  --  6.0  CREATININE 12.61* 13.80*  --      Estimated Creatinine Clearance: 8.3 mL/min (A) (by C-G formula based on SCr of 13.8 mg/dL (H)).    Allergies  Allergen Reactions   Fish Oil Swelling and Other (See Comments)        Ethyl Acetate     Antimicrobials this admission: Vancomycin 7/12 >>  Cefepime 7/12 >>   Microbiology results: 7/12 BCx:   GrCristela FeltPharmD Clinical Pharmacist

## 2020-08-04 NOTE — ED Notes (Signed)
Pt placed on zoll per Dr Rogene Houston

## 2020-08-04 NOTE — Progress Notes (Signed)
Inpatient Diabetes Program Recommendations  AACE/ADA: New Consensus Statement on Inpatient Glycemic Control (2015)  Target Ranges:  Prepandial:   less than 140 mg/dL      Peak postprandial:   less than 180 mg/dL (1-2 hours)      Critically ill patients:  140 - 180 mg/dL   Lab Results  Component Value Date   GLUCAP 294 (H) 08/04/2020   HGBA1C 6.5 (H) 03/12/2019    Review of Glycemic Control Results for Eddie Davies, Eddie Davies (MRN [id number]) as of 08/04/2020 13:54  Ref. Range 08/04/2020 10:10 08/04/2020 10:50 08/04/2020 11:36 08/04/2020 12:26 08/04/2020 13:39  Glucose-Capillary Latest Ref Range: 70 - 99 mg/dL 543 (HH) 517 (HH) 455 (H) 359 (H) 294 (H)   Diabetes history: DM 2 Outpatient Diabetes medications:  Basaglar 35 units q HS, Novolog moderate tid with meals and HS, Tradjenta 5 mg daily Current orders for Inpatient glycemic control:  IV insulin- DKA order set Inpatient Diabetes Program Recommendations:    Continue DKA orders/IV insulin until acidosis cleared. Will follow.  Thanks Adah Perl, RN, BC-ADM Inpatient Diabetes Coordinator Pager [phone]  (8a-5p)

## 2020-08-04 NOTE — Progress Notes (Signed)
Pharmacy Antibiotic Note  Eddie Davies is a 50 y.o. male admitted on 08/04/2020 with pneumonia and cellulitis.  Pharmacy has been consulted for vancomyin and cefepime dosing.  Plan: Cefepime 2g x1 then 1g IV q24h Vancomycin 201m x1 (HD outpatient, MWF) - no session in almost a week F/u nephrology schedule, unsure when next session will be, going to ICU now -Placed variable vancomycin order so on MAR and will f/u when to dose next -Monitor renal function, clinical status, and antibiotic plan, order levels as necessary  Weight: 103.6 kg (228 lb 6.3 oz)  Temp (24hrs), Avg:97.7 F (36.5 C), Min:97.7 F (36.5 C), Max:97.7 F (36.5 C)  Recent Labs  Lab 08/04/20 0800 08/04/20 0844 08/04/20 0955  WBC 6.3  --  6.0  CREATININE 12.61* 13.80*  --     Estimated Creatinine Clearance: 8.3 mL/min (A) (by C-G formula based on SCr of 13.8 mg/dL (H)).    Allergies  Allergen Reactions   Fish Oil Swelling and Other (See Comments)        Ethyl Acetate     Antimicrobials this admission: Vanc 7/12 >>  Cefepime 7/12 >>   Microbiology results: 7/12 BCx:   KJoetta Manners PharmD, BCascade Surgery Center LLCEmergency Medicine Clinical Pharmacist ED RPh Phone: [phone]

## 2020-08-04 NOTE — Procedures (Signed)
   I was present at this dialysis session, have reviewed the session itself and made  appropriate changes Kelly Splinter MD [place] pager [phone]   08/04/2020, 4:37 PM

## 2020-08-05 DIAGNOSIS — E0810 Diabetes mellitus due to underlying condition with ketoacidosis without coma: Secondary | ICD-10-CM | POA: Diagnosis not present

## 2020-08-05 LAB — POCT I-STAT 7, (LYTES, BLD GAS, ICA,H+H)
Acid-base deficit: 2 mmol/L (ref 0.0–2.0)
Bicarbonate: 23.4 mmol/L (ref 20.0–28.0)
Calcium, Ion: 1.03 mmol/L — ABNORMAL LOW (ref 1.15–1.40)
HCT: 24 % — ABNORMAL LOW (ref 39.0–52.0)
Hemoglobin: 8.2 g/dL — ABNORMAL LOW (ref 13.0–17.0)
O2 Saturation: 94 %
Patient temperature: 98.1
Potassium: 3.2 mmol/L — ABNORMAL LOW (ref 3.5–5.1)
Sodium: 135 mmol/L (ref 135–145)
TCO2: 25 mmol/L (ref 22–32)
pCO2 arterial: 39.3 mmHg (ref 32.0–48.0)
pH, Arterial: 7.381 (ref 7.350–7.450)
pO2, Arterial: 70 mmHg — ABNORMAL LOW (ref 83.0–108.0)

## 2020-08-05 LAB — PHOSPHORUS: Phosphorus: 6.6 mg/dL — ABNORMAL HIGH (ref 2.5–4.6)

## 2020-08-05 LAB — CBC
HCT: 26.4 % — ABNORMAL LOW (ref 39.0–52.0)
Hemoglobin: 9 g/dL — ABNORMAL LOW (ref 13.0–17.0)
MCH: 34.1 pg — ABNORMAL HIGH (ref 26.0–34.0)
MCHC: 34.1 g/dL (ref 30.0–36.0)
MCV: 100 fL (ref 80.0–100.0)
Platelets: 66 10*3/uL — ABNORMAL LOW (ref 150–400)
RBC: 2.64 MIL/uL — ABNORMAL LOW (ref 4.22–5.81)
RDW: 14.5 % (ref 11.5–15.5)
WBC: 4.7 10*3/uL (ref 4.0–10.5)
nRBC: 0 % (ref 0.0–0.2)

## 2020-08-05 LAB — BETA-HYDROXYBUTYRIC ACID: Beta-Hydroxybutyric Acid: 0.08 mmol/L (ref 0.05–0.27)

## 2020-08-05 LAB — GLUCOSE, CAPILLARY
Glucose-Capillary: 108 mg/dL — ABNORMAL HIGH (ref 70–99)
Glucose-Capillary: 57 mg/dL — ABNORMAL LOW (ref 70–99)
Glucose-Capillary: 177 mg/dL — ABNORMAL HIGH (ref 70–99)
Glucose-Capillary: 131 mg/dL — ABNORMAL HIGH (ref 70–99)
Glucose-Capillary: 154 mg/dL — ABNORMAL HIGH (ref 70–99)
Glucose-Capillary: 125 mg/dL — ABNORMAL HIGH (ref 70–99)
Glucose-Capillary: 184 mg/dL — ABNORMAL HIGH (ref 70–99)

## 2020-08-05 LAB — BASIC METABOLIC PANEL
Anion gap: 11 (ref 5–15)
BUN: 31 mg/dL — ABNORMAL HIGH (ref 6–20)
CO2: 22 mmol/L (ref 22–32)
Calcium: 7.1 mg/dL — ABNORMAL LOW (ref 8.9–10.3)
Chloride: 100 mmol/L (ref 98–111)
Creatinine, Ser: 7.07 mg/dL — ABNORMAL HIGH (ref 0.61–1.24)
GFR, Estimated: 9 mL/min — ABNORMAL LOW (ref >60)
Glucose, Bld: 62 mg/dL — ABNORMAL LOW (ref 70–99)
Potassium: 3.2 mmol/L — ABNORMAL LOW (ref 3.5–5.1)
Sodium: 133 mmol/L — ABNORMAL LOW (ref 135–145)

## 2020-08-05 LAB — MAGNESIUM: Magnesium: 2.1 mg/dL (ref 1.7–2.4)

## 2020-08-05 MED ORDER — INSULIN ASPART 100 UNIT/ML IJ SOLN
0.0000 [IU] | Freq: Three times a day (TID) | INTRAMUSCULAR | Status: DC
  Administered 2020-08-05 (×2): 1 [IU] via SUBCUTANEOUS

## 2020-08-05 MED ORDER — CARBAMAZEPINE ER 100 MG PO TB12: 100.0000 mg | ORAL_TABLET | Freq: Two times a day (BID) | ORAL | Status: DC

## 2020-08-05 MED ORDER — NEPRO/CARBSTEADY PO LIQD: Freq: Every day | ORAL | Status: DC

## 2020-08-05 MED ORDER — INSULIN ASPART 100 UNIT/ML IJ SOLN: 0.0000 [IU] | Freq: Every day | INTRAMUSCULAR | Status: DC

## 2020-08-05 MED ORDER — BASAGLAR KWIKPEN 100 UNIT/ML ~~LOC~~ SOPN: 25.0000 [IU] | PEN_INJECTOR | Freq: Every day | SUBCUTANEOUS | 0 refills | Status: DC

## 2020-08-05 MED ORDER — IPRATROPIUM-ALBUTEROL 0.5-2.5 (3) MG/3ML IN SOLN: 3.0000 mL | Freq: Four times a day (QID) | RESPIRATORY_TRACT | Status: DC | PRN

## 2020-08-05 MED ORDER — PANTOPRAZOLE SODIUM 40 MG PO TBEC: 40.0000 mg | DELAYED_RELEASE_TABLET | Freq: Every day | ORAL | Status: DC

## 2020-08-05 MED ORDER — TAMSULOSIN HCL 0.4 MG PO CAPS
0.4000 mg | ORAL_CAPSULE | Freq: Every day | ORAL | Status: DC
  Administered 2020-08-05: 0.4 mg via ORAL
  Filled 2020-08-05: qty 1

## 2020-08-05 MED ORDER — INSULIN GLARGINE 100 UNIT/ML ~~LOC~~ SOLN
25.0000 [IU] | Freq: Every day | SUBCUTANEOUS | Status: DC
  Filled 2020-08-05 (×2): qty 0.25

## 2020-08-05 MED ORDER — LACTULOSE 10 GM/15ML PO SOLN
10.0000 g | Freq: Two times a day (BID) | ORAL | Status: DC
  Administered 2020-08-05: 10 g via ORAL
  Filled 2020-08-05 (×2): qty 15

## 2020-08-05 MED ORDER — DULOXETINE HCL 20 MG PO CPEP
20.0000 mg | ORAL_CAPSULE | Freq: Every day | ORAL | Status: DC
  Administered 2020-08-05: 20 mg via ORAL
  Filled 2020-08-05: qty 1

## 2020-08-05 MED ORDER — CARBAMAZEPINE ER 200 MG PO TB12: 800.0000 mg | ORAL_TABLET | Freq: Two times a day (BID) | ORAL | Status: DC

## 2020-08-05 MED ORDER — PHENOBARBITAL 32.4 MG PO TABS: 259.2000 mg | ORAL_TABLET | Freq: Every day | ORAL | Status: DC

## 2020-08-05 MED ORDER — PANTOPRAZOLE SODIUM 40 MG PO TBEC
40.0000 mg | DELAYED_RELEASE_TABLET | Freq: Every day | ORAL | Status: DC
  Administered 2020-08-05: 40 mg via ORAL
  Filled 2020-08-05 (×2): qty 1

## 2020-08-05 MED ORDER — POTASSIUM CHLORIDE CRYS ER 20 MEQ PO TBCR
20.0000 meq | EXTENDED_RELEASE_TABLET | Freq: Once | ORAL | Status: AC
  Administered 2020-08-05: 20 meq via ORAL
  Filled 2020-08-05: qty 1

## 2020-08-05 NOTE — Discharge Summary (Addendum)
Physician Discharge Summary         Patient ID: Eddie Davies MRN: [id number] DOB/AGE: 1970-09-20 50 y.o.  Admit date: 08/04/2020 Discharge date: 08/05/2020  Discharge Diagnoses:    Diabetic Ketoacidosis (resolved) Diabetes (insulin dependent) Hyperglycemia  Hyperkalemia in setting of DKA (resolved) ESRD Acute metabolic Encephalopathy (resolved) Nausea and Vomiting (resolved) Bed bound with chronic Lower extremity weakness Chronic anemia due to ESRD Thrombocytopenia Systemic inflammatory response  Pseudohyponatremia  H/o Seizure disorder  Chronic skin condition: psoriasis variant  History of anxiety disorder Remote h/o pyelonephritis and multiple abdominal infections.     Discharge summary     Eddie Davies is a 50 year old well-known to our practice who resides in a nursing home in [place], [place] is notable for end-stage renal disease, IDDM, seizures, history of COVID-19, cirrhosis of the liver, continued tobacco use, benign prostatic hyperplasia, bacteremia with Klebsiella pneumonia, chronic anemia and was at his skilled [facility] when he was transferred to [facility] on 7/12 after missing dialysis x2. Potassium was noted to be 6.8 and glucose was greater than 700. Likely DKA. Lokelma, bicarb, calcium gluconate given. Patient was placed on insulin drip. Nephrology consulted for urgent dialysis. Critical care consulted and admitted patient to ICU. Was placed on antibiotics to empirically treatfor underlying infection. Started on Cefepime and vanc. On 7/13, glucose improved. Off insulin drip and on SSI and basal insulin. Tolerated diet, mental status better. No further SIRS markers noted and wbc was normal as well as negative procalcitonin so antibiotics were stopped. Patient recovered faster than expected. As mental status was improved, glycemic control improved and metabolic derangements resolved he was deemed ready for transfer  back to SNF with the plan as outlined below.    Discharge Plan by Active Problems    Insulin dependent diabetes w/ resolved DKA due to 2 missed dialysis treatments Plan Cont ssi AC and daily lantus     Acute metabolic encephalopathy: improving LE weakness- WC bound x 3 years; improving Plan Resume prior supportive activities and physical therapy at SNF    Chronic anemia due to ESRD  Thrombocytopenia  Plan F/u intermittent CBC    History of seizures Plan Cont tegretol and phenobarb     Chronic skin condition which is a variant of psoriasis with multiple symptoms of infection therefore we will do empirical antimicrobial therapy Plan Monitor for evidence of infection antibiotics were stopped     History of anxiety disorder Plan -continue home Cymbalta      Discharge Exam: Blood Pressure (Abnormal) 150/92   Pulse 83   Temperature 98.1 F (36.7 C) (Oral)   Respiration 10   Weight 111 kg   Oxygen Saturation 98%   Body Mass Index 31.42 kg/m   General: ill appearing male in NAD HEENT: MM pink/moist Neuro: still sleepy this morning; follows commands; AOx3 CV: s1s2, no m/r/g PULM:  dim clear bs bilaterally; on room air GI: soft, bsx4 active Extremities: darkly pigmented with scaly patches on skin BLE Skin: no rashes or lesions  Labs at discharge   Lab Results  Component Value Date   CREATININE 7.07 (H) 08/05/2020   BUN 31 (H) 08/05/2020   NA 133 (L) 08/05/2020   K 3.2 (L) 08/05/2020   CL 100 08/05/2020   CO2 22 08/05/2020   Lab Results  Component Value Date   WBC 4.7 08/05/2020   HGB 9.0 (L) 08/05/2020   HCT 26.4 (L) 08/05/2020   MCV 100.0 08/05/2020   PLT  66 (L) 08/05/2020   Lab Results  Component Value Date   ALT 20 08/04/2020   AST 23 08/04/2020   ALKPHOS 124 08/04/2020   BILITOT 0.8 08/04/2020   Lab Results  Component Value Date   INR 1.3 (H) 08/04/2020   INR 1.2 03/18/2019   INR 1.7 (H) 03/12/2019    Current radiological studies     DG Chest Port 1 View  Result Date: 08/04/2020 CLINICAL DATA:  50 year old male dialysis patient with altered mental status. Atrial fibrillation. EXAM: PORTABLE CHEST 1 VIEW COMPARISON:  Chest radiographs 01/27/2020 and earlier. FINDINGS: Portable AP semi upright view at 0822 hours. Borderline to mild cardiomegaly. Other mediastinal contours are within normal limits. Visualized tracheal air column is within normal limits. Allowing for portable technique the lungs are clear. No pneumothorax or pleural effusion. No osseous abnormality identified. IMPRESSION: Borderline to mild cardiomegaly. No acute cardiopulmonary abnormality. Electronically Signed   By: Genevie Ann M.D.   On: 08/04/2020 08:31    Disposition:    Discharge disposition: 03-Skilled Nursing Facility      Discharge Instructions     Diet - low sodium heart healthy   Complete by: As directed    Increase activity slowly   Complete by: As directed    No wound care   Complete by: As directed        Allergies as of 08/05/2020     Allergen Reactions Comments   Fish Oil Swelling, Other (See Comments)    Ethyl Acetate          Medication List     Stop taking these medications    HYDROcodone-acetaminophen 5-325 MG tablet Commonly known as: Norco   penicillin v potassium 500 MG tablet Commonly known as: VEETID       Take these medications    acetaminophen 325 MG tablet Commonly known as: TYLENOL Take 650 mg by mouth every 8 (eight) hours as needed for fever (pain).   allopurinol 100 MG tablet Commonly known as: ZYLOPRIM Take 100 mg by mouth daily.   Basaglar KwikPen 100 UNIT/ML Inject 25 Units into the skin at bedtime. What changed:  how much to take Another medication with the same name was removed. Continue taking this medication, and follow the directions you see here.   calcium carbonate 500 MG chewable tablet Commonly known as: TUMS - dosed in mg elemental calcium Chew 2,000 mg by mouth as directed.  Give 4 tablet (2000 mg) TID & Give 5 tablets (2500 mg) on MWF   carbamazepine 400 MG 12 hr tablet Commonly known as: TEGRETOL XR Take 800 mg by mouth 2 (two) times daily.   carbamazepine 100 MG 12 hr tablet Commonly known as: TEGRETOL XR Take 100 mg by mouth 2 (two) times daily.   cyclobenzaprine 5 MG tablet Commonly known as: FLEXERIL Take 5 mg by mouth daily as needed for muscle spasms.   DULoxetine 20 MG capsule Commonly known as: CYMBALTA Take 20 mg by mouth daily.   feeding supplement (PRO-STAT 64) Liqd Take 30 mLs by mouth in the morning and at bedtime.   insulin aspart 100 UNIT/ML injection Commonly known as: novoLOG Inject 0-15 Units into the skin as directed. Inject 5 units daily with meals TID & Inject TID Per Sliding Scale with meals If 121-200=2 units, 201-250=5 units, 251-300=8 units, 301-350=11 units, 351-400=15 units >400 call MD   Ipratropium-Albuterol 20-100 MCG/ACT Aers respimat Commonly known as: COMBIVENT Inhale 1 puff into the lungs every 6 (six) hours as needed  for wheezing.   ketoconazole 2 % shampoo Commonly known as: NIZORAL Apply 1 application topically See admin instructions. APPLY TO SCALP (SHAMPOO) TOPICALLY EVERY DAY SHIFT EVERY TUE, FRI, SUN FOR DRY SCALP (BATH DAYS) APPLY TO SCALP 3X A WEEK & RINSE.   lactulose 10 GM/15ML solution Commonly known as: CHRONULAC Take 15 mLs (10 g total) by mouth 2 (two) times daily.   lidocaine-prilocaine cream Commonly known as: EMLA Apply 1 application topically every Monday, Wednesday, and Friday.   multivitamin with minerals Tabs tablet Take 1 tablet by mouth at bedtime.   NEPRO PO Take 1 Bottle by mouth at bedtime.   omeprazole 20 MG capsule Commonly known as: PRILOSEC Take 20 mg by mouth daily.   ondansetron 4 MG tablet Commonly known as: ZOFRAN Take 4 mg by mouth every 8 (eight) hours as needed for nausea or vomiting.   PHENobarbital 64.8 MG tablet Commonly known as: LUMINAL Take 4 tablets  (259.2 mg total) by mouth at bedtime.   polyethylene glycol 17 g packet Commonly known as: MIRALAX / GLYCOLAX Take 17 g by mouth daily as needed. What changed: reasons to take this   promethazine 25 MG/ML injection Commonly known as: PHENERGAN Inject 25 mg into the muscle every 6 (six) hours as needed for nausea or vomiting.   simvastatin 20 MG tablet Commonly known as: ZOCOR Take 20 mg by mouth at bedtime.   tamsulosin 0.4 MG Caps capsule Commonly known as: FLOMAX Take 0.4 mg by mouth at bedtime.   Tradjenta 5 MG Tabs tablet Generic drug: linagliptin Take 5 mg by mouth daily.         Follow-up appointment   Per SNF Discharge Condition:    stable  Physician Statement:   The Patient was personally examined, the discharge assessment and plan has been personally reviewed and I agree with ACNP Babcock's assessment and plan. 36  minutes of time have been dedicated to discharge assessment, planning and discharge instructions.   Signed: Mikki Harbor, Danae Chen [facility] 08/05/2020, 12:44 PM  Please see [email or web address] for pager details.  From 7A-7P if no response, please call [phone]. After hours, please call ELink [phone].

## 2020-08-05 NOTE — NC FL2 (Signed)
[place] LEVEL OF CARE SCREENING TOOL     IDENTIFICATION  Patient Name: Eddie Davies Birthdate: [birth year] Sex: male Admission Date (Current Location): 08/04/2020  [facility] and [place] Number:  Herbalist and Address:  [facility], [place] [address] [place], [place] [address]      Provider Number: [id number]  Attending Physician Name and Address:  Freddi Starr, MD  Relative Name and Phone Number:  Mosie Lukes (Other)   [phone]    Current Level of Care: Hospital Recommended Level of Care: [place] Prior Approval Number:    Date Approved/Denied:   PASRR Number: [id number]  Discharge Plan: SNF    Current Diagnoses: Patient Active Problem List   Diagnosis Date Noted   DKA (diabetic ketoacidosis) ([place]) 08/04/2020   Hypertension    Diabetes mellitus without complication ([place])    Tobacco use    Polyneuropathy, unspecified    Pneumonia    Major depressive disorder, single episode, unspecified    Liver failure (HCC)    Heart failure, unspecified ([place])    Gout, unspecified    ESRD (end stage renal disease) ([place])    Epilepsy, unspecified, not intractable, without status epilepticus ([place])    Anxiety disorder, unspecified    Chest pain, unspecified 01/27/2020   Other fluid overload 04/10/2019   Allergy, unspecified, initial encounter 04/06/2019   Retroperitoneal abscess ([place]) 03/13/2019   Infection due to ESBL-producing Klebsiella pneumoniae 03/13/2019   Personal history of COVID-19 02/25/2019   Postprocedural hematoma of a genitourinary system organ or structure following a genitourinary system procedure 02/14/2019   Coagulopathy ([place]) 02/06/2019   Failure to thrive in adult 01/21/2019   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   Protein-calorie malnutrition, severe ([place]) 01/21/2019   End stage liver disease ([place]) 01/09/2019   COVID-19 virus infection 01/09/2019   History of ESBL  Klebsiella pneumoniae infection 01/09/2019   Septic shock ([place]) 01/09/2019   ESRD on hemodialysis ([place]) 12/27/2018   Gram negative sepsis ([place]) 12/27/2018   History of Clostridioides difficile colitis 12/27/2018   Anemia in chronic kidney disease 08/28/2018   Secondary hyperparathyroidism of renal origin ([place]) 08/28/2018   Enterocolitis due to Clostridium difficile, not specified as recurrent 08/09/2018   Pain, unspecified 07/09/2018   Klebsiella pneumoniae (k. pneumoniae) as the cause of diseases classified elsewhere 06/25/2018   Bacteremia due to Klebsiella pneumoniae 06/19/2018   Fever 06/18/2018   Anemia in ESRD (end-stage renal disease) ([place]) 06/18/2018   Anemia due to chronic kidney disease 06/18/2018   Leukocytosis 06/18/2018   Diarrhea, unspecified 06/08/2018   Dyspnea, unspecified 06/01/2018   Encounter for screening for respiratory tuberculosis 06/01/2018   Other cirrhosis of liver ([place]) 06/01/2018   Seizures ([place]) 05/24/2018   Benign prostatic hyperplasia without lower urinary tract symptoms 05/24/2018   Sepsis ([place]) 05/24/2018   Benign essential HTN 05/24/2018   Thrombocytopenia ([place]) 07/20/9483   Metabolic acidosis 46/27/0350   Metabolic bone disease 09/38/1829   Iron deficiency anemia 05/11/2018   Vitamin D deficiency 05/11/2018   Cirrhosis ([place]) 04/2018   Respiratory failure ([place])    Pressure injury of skin 04/21/2018   Altered mental status, unspecified 04/21/2018   HCAP (healthcare-associated pneumonia) 04/21/2018   Anemia due to chronic kidney disease, on chronic dialysis ([place]) 04/07/2018   Closed fracture of left upper extremity with routine healing 03/20/2018   Difficult intravenous access 03/16/2018   Delirium 03/14/2018   Hyperammonemia ([place]) 03/14/2018   Hypocalcemia 03/08/2018   Urinary calculus, unspecified  03/08/2018   Physical deconditioning 03/08/2018   Pressure injury of lower back, stage 2 ([place]) 03/08/2018   HTN (hypertension) 03/08/2018    Other hypoglycemia 03/08/2018   Closed intertrochanteric fracture of left femur ([place]) 01/21/2018   Fall 01/21/2018   Fracture of left ulna 01/21/2018   Fracture of left tibia 01/21/2018   Trauma 01/21/2018   Diabetic ulcer of toe associated with type 2 diabetes mellitus, limited to breakdown of skin ([place]) 02/14/2017   Hyperosmolar non-ketotic state in patient with type 2 diabetes mellitus ([place]) 02/14/2017   Anemia 12/08/2016   Diabetic ketoacidosis ([place]) 12/08/2016   GERD (gastroesophageal reflux disease) 12/08/2016   Hyponatremia 12/08/2016   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   Open wound of left foot except toes with complication 00/17/4944   Hyperlipidemia 06/22/2016   Type 2 diabetes mellitus with unspecified complications ([place]) 96/75/9163   Benign hypertension with chronic kidney disease, stage III ([place]) 06/22/2016   Abscess of scrotum 09/19/2014   Cellulitis of toe of right foot 09/19/2014   Axillary abscess 02/03/2014   Hypokalemia 02/03/2014    Orientation RESPIRATION BLADDER Height & Weight     Self  Normal Continent Weight: 244 lb 11.4 oz (111 kg) Height:     BEHAVIORAL SYMPTOMS/MOOD NEUROLOGICAL BOWEL NUTRITION STATUS      Incontinent Diet (see d/c summary)  AMBULATORY STATUS COMMUNICATION OF NEEDS Skin   Limited Assist Verbally                         Personal Care Assistance Level of Assistance  Bathing, Feeding, Dressing Bathing Assistance: Limited assistance Feeding assistance: Independent Dressing Assistance: Limited assistance     Functional Limitations Info  Speech, Hearing, Sight Sight Info: Adequate Hearing Info: Adequate Speech Info: Adequate    SPECIAL CARE FACTORS FREQUENCY  PT (By licensed PT), OT (By licensed OT)     PT Frequency: 5x/ week OT Frequency: 5x/week            Contractures Contractures Info: Not present    Additional Factors Info  Code Status, Allergies, Insulin Sliding Scale Code Status Info: Full Allergies Info:  Fish Oil   Ethyl Acetate   Insulin Sliding Scale Info: See d/c medication list       Current Medications (08/05/2020):  This is the current hospital active medication list Current Facility-Administered Medications  Medication Dose Route Frequency Provider Last Rate Last Admin   acetaminophen (TYLENOL) tablet 650 mg  650 mg Oral Q6H PRN Julian Hy, DO   650 mg at 08/05/20 8466   carbamazepine (TEGRETOL XR) 12 hr tablet 900 mg  900 mg Oral BID Julian Hy, DO   900 mg at 08/05/20 1007   Chlorhexidine Gluconate Cloth 2 % PADS 6 each  6 each Topical Q0600 Roney Jaffe, MD   6 each at 08/04/20 2116   dextrose 50 % solution 0-50 mL  0-50 mL Intravenous PRN Fredia Sorrow, MD       docusate sodium (COLACE) capsule 100 mg  100 mg Oral BID PRN Minor, Grace Bushy, NP       DULoxetine (CYMBALTA) DR capsule 20 mg  20 mg Oral Daily Mick Sell, [name]   20 mg at 08/05/20 1006   feeding supplement (NEPRO CARB STEADY) liquid   Oral QHS Noemi Chapel P, DO       heparin injection 1,500 Units  1,500 Units Dialysis Once in dialysis Roney Jaffe, MD       heparin injection 5,000  Units  5,000 Units Subcutaneous Q8H Julian Hy, DO   5,000 Units at 08/05/20 0604   insulin aspart (novoLOG) injection 0-5 Units  0-5 Units Subcutaneous QHS Freddi Starr, MD       insulin aspart (novoLOG) injection 0-9 Units  0-9 Units Subcutaneous TID WC Freddi Starr, MD       insulin glargine (LANTUS) injection 25 Units  25 Units Subcutaneous Daily Freddi Starr, MD       ipratropium-albuterol (DUONEB) 0.5-2.5 (3) MG/3ML nebulizer solution 3 mL  3 mL Inhalation Q6H PRN Noemi Chapel P, DO       lactulose (CHRONULAC) 10 GM/15ML solution 10 g  10 g Oral BID Noemi Chapel P, DO   10 g at 08/05/20 1010   pantoprazole (PROTONIX) EC tablet 40 mg  40 mg Oral QHS Wise, Nason S, RPH       PHENobarbital (LUMINAL) tablet 259.2 mg  259.2 mg Oral QHS Noemi Chapel P, DO   259.2 mg at 08/04/20 2213   polyethylene  glycol (MIRALAX / GLYCOLAX) packet 17 g  17 g Oral Daily PRN Minor, Grace Bushy, NP       potassium chloride SA (KLOR-CON) CR tablet 20 mEq  20 mEq Oral Once Mick Sell, [name]       tamsulosin (FLOMAX) capsule 0.4 mg  0.4 mg Oral QHS Julian Hy, DO         Discharge Medications: Please see discharge summary for a list of discharge medications.  Relevant Imaging Results:  Relevant Lab Results:   Additional Information SSN: [id number]  Paulene Floor Hanan Mcwilliams, LCSWA

## 2020-08-05 NOTE — Progress Notes (Signed)
[facility] Progress Note  Subjective: looks much better, alert and Ox 3, K 3.2 and abg pH 7.38  Vitals:   08/05/20 0800 08/05/20 0900 08/05/20 1000 08/05/20 1137  BP: (!) 150/92 (!) 110/94 (!) 150/92   Pulse: 87 80 83   Resp: 17 15 10    Temp:    98.1 F (36.7 C)  TempSrc:    Oral  SpO2: 100% 98% 98%   Weight:        Exam:  alert, nad   no jvd  Chest cta bilat  Cor reg no RG  Abd soft ntnd no ascites   Ext no LE edema   Alert, NF, ox3  RUA AVF+bruit          OP HD: [facility] MWF  4h 36mn  400/500  103kg  2K/3Ca bath  RUA AVF  Hep 1500  - last hd 7/6  - hect 3 ug IV tiw  - mircera 75 q2 last on 6/29     Assessment/ Plan: Hyperkalemia/ metabolic acidosis - severe, resolved w/ HD and IV insulin AMS - prob uremia, resolved Uncont hyperglycemia - in pt w/ DM2, sp IV insulin Metabolic acidosis - due to uncont DM and missed HD, resolved ESRD - HD MWF. Had HD yest here w/ much improved labs. Next HD either this afternoon prior to dc or will see as an alternative if he can get OP HD tomorrow off schedule.  Anemia ckd - Hb 8-10 here, due for esa will dose today. Follow. MBD ckd - cont vdra w hd Dispo - for dc back to SNF today       Eddie Davies 08/05/2020, 11:47 AM   Recent Labs  Lab 08/04/20 0800 08/04/20 0844 08/04/20 0933 08/04/20 1304 08/05/20 0628 08/05/20 0658  K 6.8* 6.9*   < >  --  3.2* 3.2*  BUN 73* 91*  --   --   --  31*  CREATININE 12.61* 13.80*  --  12.06*  --  7.07*  CALCIUM 6.7*  --   --   --   --  7.1*  PHOS  --   --   --  11.5*  --  6.6*  HGB 9.9* 10.2*   < > 9.8* 8.2* 9.0*   < > = values in this interval not displayed.   Inpatient medications:  carbamazepine  900 mg Oral BID   Chlorhexidine Gluconate Cloth  6 each Topical Q0600   DULoxetine  20 mg Oral Daily   feeding supplement (NEPRO CARB STEADY)   Oral QHS   heparin  1,500 Units Dialysis Once in dialysis   heparin injection (subcutaneous)  5,000 Units Subcutaneous Q8H    insulin aspart  0-5 Units Subcutaneous QHS   insulin aspart  0-9 Units Subcutaneous TID WC   insulin glargine  25 Units Subcutaneous Daily   lactulose  10 g Oral BID   pantoprazole  40 mg Oral QHS   PHENobarbital  259.2 mg Oral QHS   potassium chloride  20 mEq Oral Once   tamsulosin  0.4 mg Oral QHS    acetaminophen, dextrose, docusate sodium, ipratropium-albuterol, polyethylene glycol

## 2020-08-05 NOTE — Progress Notes (Addendum)
Inpatient Diabetes Program Recommendations  AACE/ADA: New Consensus Statement on Inpatient Glycemic Control (2015)  Target Ranges:  Prepandial:   less than 140 mg/dL      Peak postprandial:   less than 180 mg/dL (1-2 hours)      Critically ill patients:  140 - 180 mg/dL   Results for Eddie Davies, Eddie Davies (MRN [id number]) as of 08/05/2020 10:09  Ref. Range 08/04/2020 19:36 08/04/2020 20:45 08/04/2020 21:54 08/04/2020 23:42 08/05/2020 03:24 08/05/2020 07:36 08/05/2020 10:05  Glucose-Capillary Latest Ref Range: 70 - 99 mg/dL 105 (H)  30 units LANTUS @19 :44 123 (H) 195 (H) 227 (H)  3 units NOVOLOG  108 (H) 57 (L) 177 (H)    Home DM meds: Basaglar 35 units q HS     Novolog 5 units TID with meals      Novolog SSi TID     Tradjenta 5 mg daily   Current Orders: Lantus 30 units Daily     Novolog 1-2-3 units Q4 hours   Transitioned off the IV Insulin Drip last PM  Lantus 30 units started at 8pm    MD- Note mild Hypoglycemia this AM (CBG 57)  Please consider:  1. Reduce Lantus to 25 units Daily  2. Stop the ICU Glycemic control protocol  3. Change Novolog SSi to the 0-9 unit scale TID AC + HS per the regular Glycemic control order set  4. May need to add back Novolog Meal Coverage once taking sufficient POs    --Will follow patient during hospitalization--  Wyn Quaker RN, MSN, CDE Diabetes Coordinator Inpatient Glycemic Control Team Team Pager: [phone] (8a-5p)

## 2020-08-05 NOTE — TOC Transition Note (Addendum)
Transition of Care [facility]) - CM/SW Discharge Note   Patient Details  Name: Jrue Jarriel MRN: [id number] Date of Birth: August 12, 1970  [facility] CM/SW Contact:  Milinda Antis, [place] Phone Number: 08/05/2020, 1:19 PM   Clinical Narrative:     11:20-  CSW spoke with Johann Capers at [facility] and was informed that the patient was LTC and can return.    Patient will DC to: [facility] Anticipated DC date: 08/05/2020 Family notified: yes    Transport by: Corey Harold   Per MD patient ready for DC to SNF . RN to call report prior to discharge [phone]. RN, patient, patient's family, and facility notified of DC. Discharge Summary and FL2 sent to facility. DC packet on chart. Ambulance transport will be requested for patient when RN reports patient is ready.   CSW will sign off for now as social work intervention is no longer needed. Please consult [place] again if new needs arise.     Final next level of care: [place] Barriers to Discharge: No Barriers Identified   Patient Goals and CMS Choice        Discharge Placement              Patient chooses bed at:  ([facility]) Patient to be transferred to facility by: [facility] Name of family member notified: Mosie Lukes (Other)   [phone] Patient and family notified of of transfer: 08/05/20  Discharge Plan and Services                                     Social Determinants of Health ([place]) Interventions     Readmission Risk Interventions Readmission Risk Prevention Plan 03/19/2019 11/29/2018 05/28/2018  Transportation Screening Complete Complete -  PCP or Specialist Appt within 5-7 Days - - -  Home Care Screening - - -  Medication Review (RN CM) - - -  Medication Review (RN Transport planner) Referral to Pharmacy Complete -  PCP or Specialist appointment within 3-5 days of discharge Not Complete Not Complete -  PCP/Specialist Appt Not Complete comments LTC  SNF resident pt is SNF resident -  [place] or [place] Not Complete Not Complete Complete  HRI or Home Care Consult Pt Refusal Comments LTC SNF resident pt is SNF resident -  SW Recovery Care/Counseling Consult Complete Complete Complete  Palliative Care Screening Not Applicable Not Applicable -  Skilled Nursing Facility Complete Complete -  Some recent data might be hidden

## 2020-08-05 NOTE — Plan of Care (Signed)

## 2020-08-09 LAB — CULTURE, BLOOD (ROUTINE X 2)
Culture: NO GROWTH
Culture: NO GROWTH
Special Requests: ADEQUATE

## 2020-09-15 DIAGNOSIS — Z4802 Encounter for removal of sutures: Secondary | ICD-10-CM

## 2020-09-15 HISTORY — DX: Encounter for removal of sutures: Z48.02

## 2021-01-21 ENCOUNTER — Emergency Department (HOSPITAL_COMMUNITY): Payer: Medicare Other

## 2021-01-21 ENCOUNTER — Inpatient Hospital Stay (HOSPITAL_COMMUNITY)
Admission: EM | Admit: 2021-01-21 | Discharge: 2021-01-24 | DRG: 640 | Disposition: A | Discharge status: Skilled Nursing Facility | Payer: Medicare Other | Attending: Internal Medicine | Admitting: Internal Medicine

## 2021-01-21 ENCOUNTER — Encounter (HOSPITAL_COMMUNITY): Payer: Self-pay | Admitting: Emergency Medicine

## 2021-01-21 ENCOUNTER — Other Ambulatory Visit: Payer: Self-pay

## 2021-01-21 DIAGNOSIS — K219 Gastro-esophageal reflux disease without esophagitis: Secondary | ICD-10-CM | POA: Diagnosis present

## 2021-01-21 DIAGNOSIS — E875 Hyperkalemia: Secondary | ICD-10-CM | POA: Diagnosis not present

## 2021-01-21 DIAGNOSIS — Z79899 Other long term (current) drug therapy: Secondary | ICD-10-CM

## 2021-01-21 DIAGNOSIS — R310 Gross hematuria: Secondary | ICD-10-CM | POA: Diagnosis present

## 2021-01-21 DIAGNOSIS — Z888 Allergy status to other drugs, medicaments and biological substances status: Secondary | ICD-10-CM

## 2021-01-21 DIAGNOSIS — Z794 Long term (current) use of insulin: Secondary | ICD-10-CM

## 2021-01-21 DIAGNOSIS — L0231 Cutaneous abscess of buttock: Secondary | ICD-10-CM

## 2021-01-21 DIAGNOSIS — I700 Atherosclerosis of aorta: Secondary | ICD-10-CM | POA: Diagnosis present

## 2021-01-21 DIAGNOSIS — G8929 Other chronic pain: Secondary | ICD-10-CM | POA: Diagnosis present

## 2021-01-21 DIAGNOSIS — Z992 Dependence on renal dialysis: Secondary | ICD-10-CM

## 2021-01-21 DIAGNOSIS — F32A Depression, unspecified: Secondary | ICD-10-CM | POA: Diagnosis present

## 2021-01-21 DIAGNOSIS — K861 Other chronic pancreatitis: Secondary | ICD-10-CM | POA: Diagnosis present

## 2021-01-21 DIAGNOSIS — J449 Chronic obstructive pulmonary disease, unspecified: Secondary | ICD-10-CM | POA: Diagnosis present

## 2021-01-21 DIAGNOSIS — X58XXXA Exposure to other specified factors, initial encounter: Secondary | ICD-10-CM | POA: Diagnosis present

## 2021-01-21 DIAGNOSIS — M109 Gout, unspecified: Secondary | ICD-10-CM | POA: Diagnosis present

## 2021-01-21 DIAGNOSIS — M898X9 Other specified disorders of bone, unspecified site: Secondary | ICD-10-CM | POA: Diagnosis present

## 2021-01-21 DIAGNOSIS — Z8616 Personal history of COVID-19: Secondary | ICD-10-CM

## 2021-01-21 DIAGNOSIS — Q800 Ichthyosis vulgaris: Secondary | ICD-10-CM

## 2021-01-21 DIAGNOSIS — I132 Hypertensive heart and chronic kidney disease with heart failure and with stage 5 chronic kidney disease, or end stage renal disease: Secondary | ICD-10-CM | POA: Diagnosis present

## 2021-01-21 DIAGNOSIS — Z7984 Long term (current) use of oral hypoglycemic drugs: Secondary | ICD-10-CM

## 2021-01-21 DIAGNOSIS — N186 End stage renal disease: Secondary | ICD-10-CM

## 2021-01-21 DIAGNOSIS — N400 Enlarged prostate without lower urinary tract symptoms: Secondary | ICD-10-CM | POA: Diagnosis present

## 2021-01-21 DIAGNOSIS — K7581 Nonalcoholic steatohepatitis (NASH): Secondary | ICD-10-CM | POA: Diagnosis present

## 2021-01-21 DIAGNOSIS — D638 Anemia in other chronic diseases classified elsewhere: Secondary | ICD-10-CM | POA: Diagnosis present

## 2021-01-21 DIAGNOSIS — I5030 Unspecified diastolic (congestive) heart failure: Secondary | ICD-10-CM | POA: Diagnosis present

## 2021-01-21 DIAGNOSIS — K7460 Unspecified cirrhosis of liver: Secondary | ICD-10-CM | POA: Diagnosis present

## 2021-01-21 DIAGNOSIS — E785 Hyperlipidemia, unspecified: Secondary | ICD-10-CM | POA: Diagnosis present

## 2021-01-21 DIAGNOSIS — F419 Anxiety disorder, unspecified: Secondary | ICD-10-CM | POA: Diagnosis present

## 2021-01-21 DIAGNOSIS — F17210 Nicotine dependence, cigarettes, uncomplicated: Secondary | ICD-10-CM | POA: Diagnosis present

## 2021-01-21 DIAGNOSIS — E1122 Type 2 diabetes mellitus with diabetic chronic kidney disease: Secondary | ICD-10-CM | POA: Diagnosis present

## 2021-01-21 DIAGNOSIS — E1165 Type 2 diabetes mellitus with hyperglycemia: Secondary | ICD-10-CM | POA: Diagnosis present

## 2021-01-21 DIAGNOSIS — Z9115 Patient's noncompliance with renal dialysis: Secondary | ICD-10-CM

## 2021-01-21 DIAGNOSIS — D6959 Other secondary thrombocytopenia: Secondary | ICD-10-CM | POA: Diagnosis present

## 2021-01-21 DIAGNOSIS — F329 Major depressive disorder, single episode, unspecified: Secondary | ICD-10-CM | POA: Diagnosis present

## 2021-01-21 DIAGNOSIS — U071 COVID-19: Secondary | ICD-10-CM | POA: Diagnosis present

## 2021-01-21 DIAGNOSIS — Z9049 Acquired absence of other specified parts of digestive tract: Secondary | ICD-10-CM

## 2021-01-21 DIAGNOSIS — Z905 Acquired absence of kidney: Secondary | ICD-10-CM

## 2021-01-21 DIAGNOSIS — G629 Polyneuropathy, unspecified: Secondary | ICD-10-CM | POA: Diagnosis present

## 2021-01-21 DIAGNOSIS — R739 Hyperglycemia, unspecified: Secondary | ICD-10-CM

## 2021-01-21 DIAGNOSIS — Z9102 Food additives allergy status: Secondary | ICD-10-CM

## 2021-01-21 DIAGNOSIS — N200 Calculus of kidney: Secondary | ICD-10-CM | POA: Diagnosis present

## 2021-01-21 DIAGNOSIS — G40909 Epilepsy, unspecified, not intractable, without status epilepticus: Secondary | ICD-10-CM | POA: Diagnosis present

## 2021-01-21 DIAGNOSIS — S3121XA Laceration without foreign body of penis, initial encounter: Secondary | ICD-10-CM | POA: Diagnosis present

## 2021-01-21 DIAGNOSIS — E872 Acidosis, unspecified: Secondary | ICD-10-CM | POA: Diagnosis present

## 2021-01-21 LAB — CBC WITH DIFFERENTIAL/PLATELET
Abs Immature Granulocytes: 0.06 10*3/uL (ref 0.00–0.07)
Basophils Absolute: 0.1 10*3/uL (ref 0.0–0.1)
Basophils Relative: 1 %
Eosinophils Absolute: 0.1 10*3/uL (ref 0.0–0.5)
Eosinophils Relative: 2 %
HCT: 32.8 % — ABNORMAL LOW (ref 39.0–52.0)
Hemoglobin: 10.2 g/dL — ABNORMAL LOW (ref 13.0–17.0)
Immature Granulocytes: 1 %
Lymphocytes Relative: 21 %
Lymphs Abs: 1.3 10*3/uL (ref 0.7–4.0)
MCH: 32.7 pg (ref 26.0–34.0)
MCHC: 31.1 g/dL (ref 30.0–36.0)
MCV: 105.1 fL — ABNORMAL HIGH (ref 80.0–100.0)
Monocytes Absolute: 0.5 10*3/uL (ref 0.1–1.0)
Monocytes Relative: 8 %
Neutro Abs: 4.2 10*3/uL (ref 1.7–7.7)
Neutrophils Relative %: 67 %
Platelets: 85 10*3/uL — ABNORMAL LOW (ref 150–400)
RBC: 3.12 MIL/uL — ABNORMAL LOW (ref 4.22–5.81)
RDW: 14.1 % (ref 11.5–15.5)
WBC: 6.2 10*3/uL (ref 4.0–10.5)
nRBC: 0 % (ref 0.0–0.2)

## 2021-01-21 LAB — COMPREHENSIVE METABOLIC PANEL
ALT: 10 U/L (ref 0–44)
AST: 11 U/L — ABNORMAL LOW (ref 15–41)
Albumin: 2.8 g/dL — ABNORMAL LOW (ref 3.5–5.0)
Alkaline Phosphatase: 106 U/L (ref 38–126)
Anion gap: 15 (ref 5–15)
BUN: 65 mg/dL — ABNORMAL HIGH (ref 6–20)
CO2: 11 mmol/L — ABNORMAL LOW (ref 22–32)
Calcium: 6.7 mg/dL — ABNORMAL LOW (ref 8.9–10.3)
Chloride: 109 mmol/L (ref 98–111)
Creatinine, Ser: 12.31 mg/dL — ABNORMAL HIGH (ref 0.61–1.24)
GFR, Estimated: 5 mL/min — ABNORMAL LOW (ref >60)
Glucose, Bld: 369 mg/dL — ABNORMAL HIGH (ref 70–99)
Potassium: 6.4 mmol/L (ref 3.5–5.1)
Sodium: 135 mmol/L (ref 135–145)
Total Bilirubin: 0.6 mg/dL (ref 0.3–1.2)
Total Protein: 6.2 g/dL — ABNORMAL LOW (ref 6.5–8.1)

## 2021-01-21 LAB — CBG MONITORING, ED: Glucose-Capillary: 141 mg/dL — ABNORMAL HIGH (ref 70–99)

## 2021-01-21 LAB — LACTIC ACID, PLASMA: Lactic Acid, Venous: 1 mmol/L (ref 0.5–1.9)

## 2021-01-21 LAB — RESP PANEL BY RT-PCR (FLU A&B, COVID) ARPGX2
Influenza A by PCR: NEGATIVE
Influenza B by PCR: NEGATIVE
SARS Coronavirus 2 by RT PCR: POSITIVE — AB

## 2021-01-21 LAB — HEMOGLOBIN A1C
Hgb A1c MFr Bld: 8.6 % — ABNORMAL HIGH (ref 4.8–5.6)
Mean Plasma Glucose: 200.12 mg/dL

## 2021-01-21 MED ORDER — IPRATROPIUM-ALBUTEROL 20-100 MCG/ACT IN AERS: 1.0000 | INHALATION_SPRAY | Freq: Four times a day (QID) | RESPIRATORY_TRACT | Status: DC | PRN

## 2021-01-21 MED ORDER — PHENOBARBITAL 64.8 MG PO TABS: 259.2000 mg | ORAL_TABLET | Freq: Every day | ORAL | Status: DC

## 2021-01-21 MED ORDER — SIMVASTATIN 20 MG PO TABS
20.0000 mg | ORAL_TABLET | Freq: Every day | ORAL | Status: DC
  Administered 2021-01-21 – 2021-01-23 (×3): 20 mg via ORAL
  Filled 2021-01-21 (×3): qty 1

## 2021-01-21 MED ORDER — SODIUM BICARBONATE 8.4 % IV SOLN
50.0000 meq | Freq: Once | INTRAVENOUS | Status: AC
  Administered 2021-01-21: 23:00:00 50 meq via INTRAVENOUS
  Filled 2021-01-21: qty 50

## 2021-01-21 MED ORDER — SODIUM BICARBONATE 650 MG PO TABS
650.0000 mg | ORAL_TABLET | Freq: Three times a day (TID) | ORAL | Status: DC
  Administered 2021-01-21 – 2021-01-24 (×8): 650 mg via ORAL
  Filled 2021-01-21 (×8): qty 1

## 2021-01-21 MED ORDER — DEXTROSE 50 % IV SOLN
1.0000 | Freq: Once | INTRAVENOUS | Status: AC
  Administered 2021-01-21: 19:00:00 50 mL via INTRAVENOUS
  Filled 2021-01-21: qty 50

## 2021-01-21 MED ORDER — DOXYCYCLINE HYCLATE 100 MG PO TABS
100.0000 mg | ORAL_TABLET | Freq: Two times a day (BID) | ORAL | Status: DC
  Administered 2021-01-22 – 2021-01-24 (×5): 100 mg via ORAL
  Filled 2021-01-21 (×5): qty 1

## 2021-01-21 MED ORDER — SODIUM ZIRCONIUM CYCLOSILICATE 10 G PO PACK
10.0000 g | PACK | Freq: Every day | ORAL | Status: DC
  Administered 2021-01-21: 18:00:00 10 g via ORAL
  Filled 2021-01-21: qty 1

## 2021-01-21 MED ORDER — INSULIN GLARGINE-YFGN 100 UNIT/ML ~~LOC~~ SOLN
15.0000 [IU] | Freq: Every day | SUBCUTANEOUS | Status: DC
  Filled 2021-01-21: qty 0.15

## 2021-01-21 MED ORDER — PANTOPRAZOLE SODIUM 40 MG PO TBEC
40.0000 mg | DELAYED_RELEASE_TABLET | Freq: Every day | ORAL | Status: DC
  Administered 2021-01-22 – 2021-01-24 (×3): 40 mg via ORAL
  Filled 2021-01-21 (×3): qty 1

## 2021-01-21 MED ORDER — ONDANSETRON HCL 4 MG PO TABS: 4.0000 mg | ORAL_TABLET | Freq: Four times a day (QID) | ORAL | Status: DC | PRN

## 2021-01-21 MED ORDER — BACITRACIN ZINC 500 UNIT/GM EX OINT
TOPICAL_OINTMENT | Freq: Two times a day (BID) | CUTANEOUS | Status: DC
  Administered 2021-01-23 – 2021-01-24 (×3): 1 via TOPICAL
  Filled 2021-01-21: qty 28.35

## 2021-01-21 MED ORDER — ONDANSETRON HCL 4 MG/2ML IJ SOLN
4.0000 mg | Freq: Four times a day (QID) | INTRAMUSCULAR | Status: DC | PRN
  Administered 2021-01-22: 03:00:00 4 mg via INTRAVENOUS
  Filled 2021-01-21: qty 2

## 2021-01-21 MED ORDER — INSULIN ASPART 100 UNIT/ML IV SOLN
5.0000 [IU] | Freq: Once | INTRAVENOUS | Status: AC
  Administered 2021-01-21: 19:00:00 5 [IU] via INTRAVENOUS

## 2021-01-21 MED ORDER — INSULIN GLARGINE-YFGN 100 UNIT/ML ~~LOC~~ SOLN
10.0000 [IU] | Freq: Every day | SUBCUTANEOUS | Status: DC
  Filled 2021-01-21 (×4): qty 0.1

## 2021-01-21 MED ORDER — DULOXETINE HCL 20 MG PO CPEP
20.0000 mg | ORAL_CAPSULE | Freq: Every day | ORAL | Status: DC
  Administered 2021-01-22 – 2021-01-24 (×3): 20 mg via ORAL
  Filled 2021-01-21 (×3): qty 1

## 2021-01-21 MED ORDER — ACETAMINOPHEN 325 MG PO TABS
650.0000 mg | ORAL_TABLET | Freq: Four times a day (QID) | ORAL | Status: DC | PRN
  Administered 2021-01-22 – 2021-01-23 (×2): 650 mg via ORAL
  Filled 2021-01-21 (×2): qty 2

## 2021-01-21 MED ORDER — CARBAMAZEPINE ER 200 MG PO TB12
900.0000 mg | ORAL_TABLET | Freq: Two times a day (BID) | ORAL | Status: DC
  Administered 2021-01-22 – 2021-01-24 (×6): 900 mg via ORAL
  Filled 2021-01-21 (×8): qty 1

## 2021-01-21 MED ORDER — HEPARIN SODIUM (PORCINE) 5000 UNIT/ML IJ SOLN
5000.0000 [IU] | Freq: Three times a day (TID) | INTRAMUSCULAR | Status: DC
  Administered 2021-01-22 – 2021-01-24 (×7): 5000 [IU] via SUBCUTANEOUS
  Filled 2021-01-21 (×7): qty 1

## 2021-01-21 MED ORDER — INSULIN ASPART 100 UNIT/ML IJ SOLN
0.0000 [IU] | Freq: Three times a day (TID) | INTRAMUSCULAR | Status: DC
  Administered 2021-01-23: 2 [IU] via SUBCUTANEOUS

## 2021-01-21 MED ORDER — CHLORHEXIDINE GLUCONATE CLOTH 2 % EX PADS
6.0000 | MEDICATED_PAD | Freq: Every day | CUTANEOUS | Status: DC
  Administered 2021-01-22: 04:00:00 6 via TOPICAL

## 2021-01-21 MED ORDER — ACETAMINOPHEN 650 MG RE SUPP: 650.0000 mg | Freq: Four times a day (QID) | RECTAL | Status: DC | PRN

## 2021-01-21 MED ORDER — LIDOCAINE-EPINEPHRINE (PF) 2 %-1:200000 IJ SOLN
10.0000 mL | Freq: Once | INTRAMUSCULAR | Status: AC
  Administered 2021-01-21: 10 mL
  Filled 2021-01-21: qty 20

## 2021-01-21 NOTE — ED Notes (Signed)
Pt given sandwich bag and diet sprite

## 2021-01-21 NOTE — ED Notes (Signed)
Potassium 6.4.  Blue, PA notified.

## 2021-01-21 NOTE — H&P (Addendum)
Date: 01/21/2021               Patient Name:  Eddie Davies MRN: [id number]  DOB: 1970-05-02 Age / Sex: 50 y.o., male   PCP: [facility], [place] Service: Internal Medicine Teaching Service         Attending Physician: Dr. Sid Falcon, MD    First Contact: Farrel Gordon, DO Pager: ED [phone]  Second Contact: Harvie Heck, MD Pager: Michel Bickers [phone]       After Hours (After 5p/  First Contact Pager: [phone]  weekends / holidays): Second Contact Pager: [phone]   SUBJECTIVE  Chief Complaint: Missed dialysis due to Covid  History of Present Illness: Eddie Davies is a 50 y.o. male with a pertinent PMH of ESRD on HD MWF, IDDM, NASH cirrhosis, gout, hx of seizures, who presents to [facility] from [facility] after missing 2 sessions of HD due to diarrhea, nausea, and vomiting 2/2 to Covid. He reports that he started to feel sick on 12/24 with nausea, vomiting, and diarrhea.  He tested positive for COVID on 12/28.    He also states that he noticed blood on his penis when he was having a BM yesterday.  He still makes a small amount of urine and has noted it is red.   He endorses nausea, vomiting, and diarrhea. Denies dysuria or change in number of times that he is urinating. No abdominal pain.  In the ED, the patient was afebrile with HR wnl and BP 147/80.  Labs demonstrated K+ of 6.4, Bicarb of 11 with anion gap of 18, Hgb of 10.2, and albumin of 2.8. He received Lokelma and an abscess was lanced on his lower back. Nephology consulted. CT Abd/ Pelvis showed nonobstructing stone in right renal pelvis.  Medications:  Allopurinol 100 mg qd  Amino acids 30 mL BID Tums 2000 mg MWF  Carbamazepine 100 mg BID Carbamazepine 400 mg BID Phenobarbital 259.2 mg qhs  Cyclobenzaprine 5 mg  Duloxetine 20 mg qd  Novolog SSI Glargine 25 units at bedtime Tradjenta 5 mg   Combivent Respimat 1 puff daily  Lactulose 15 mL BID  Tamsulosin 0.4  mg qhs  Simvastatin 20 mg qhs  Omeprazole 20 mg   Past Medical History:  Past Medical History:  Diagnosis Date   Anxiety disorder, unspecified    Cirrhosis ([place]) 04/2018   Diabetes mellitus without complication ([place])    type II   Enterocolitis due to Clostridium difficile, not specified as recurrent 08/09/2018   admission   Epilepsy, unspecified, not intractable, without status epilepticus ([place])    ESRD (end stage renal disease) ([place])    MWF    Gout, unspecified    Heart failure, unspecified ([place])    Hypertension    Klebsiella pneumoniae (k. pneumoniae) as the cause of diseases classified elsewhere 06/25/2018   admission   Liver failure ([place])    Major depressive disorder, single episode, unspecified    Metabolic acidosis 07/6224   Pneumonia    Polyneuropathy, unspecified    Tobacco use    Vitamin D deficiency    Past surg history: Appendectomy 1981 Cholecystectomy Left radical Nephrectomy 2020  Social:  Lives - [facility] in [place] for almost 4 years, he states that he has family in the area but they do not visit him Level of function - able to transfer to wheelchair with assistance and needs assistance with ADLs PCP -  provider comes into nursing facility Substance use - currently smokes about a pack a day and has done so for 20 years, no alcohol consumption and denies other drug use  Family History: Family History  Problem Relation Age of Onset   Seizures Neg Hx     Allergies: Allergies as of 01/21/2021 - Review Complete 08/04/2020  Allergen Reaction Noted   Fish oil Swelling and Other (See Comments) 04/24/2013   Ethyl acetate  06/18/2019   Omega-3-acid ethyl esters  01/21/2021    Review of Systems: A complete ROS was negative except as per HPI.   OBJECTIVE:  Physical Exam: Blood pressure (!) 156/83, pulse 80, temperature 97.8 F (36.6 C), temperature source Oral, resp. rate 16, SpO2 99 %. Gen: chronically ill appearing, alert and  oriented x3 HENT: NCAT Eyes: No scleral icterus, conjunctiva clear Heart: RRR, no murmurs Lungs: CTAB, normal pulmonary effort Abd: normoactive bowel sounds, soft, nontender MSK: no peripheral edema bilaterally Skin: diffuse scaling present on arms, legs, and feet bilaterally  Psych: normal mood and affect   Pertinent Labs: CBC    Component Value Date/Time   WBC 6.2 01/21/2021 1545   RBC 3.12 (L) 01/21/2021 1545   HGB 10.2 (L) 01/21/2021 1545   HGB 7.8 (L) 02/04/2020 0937   HCT 32.8 (L) 01/21/2021 1545   HCT 22.2 (L) 02/04/2020 0937   PLT 85 (L) 01/21/2021 1545   PLT 100 (LL) 02/04/2020 0937   MCV 105.1 (H) 01/21/2021 1545   MCV 101 (H) 02/04/2020 0937   MCH 32.7 01/21/2021 1545   MCHC 31.1 01/21/2021 1545   RDW 14.1 01/21/2021 1545   RDW 14.0 02/04/2020 0937   LYMPHSABS 1.3 01/21/2021 1545   MONOABS 0.5 01/21/2021 1545   EOSABS 0.1 01/21/2021 1545   BASOSABS 0.1 01/21/2021 1545     CMP     Component Value Date/Time   NA 135 01/21/2021 1545   NA 135 02/04/2020 0937   K 6.4 (HH) 01/21/2021 1545   CL 109 01/21/2021 1545   CO2 11 (L) 01/21/2021 1545   GLUCOSE 369 (H) 01/21/2021 1545   BUN 65 (H) 01/21/2021 1545   BUN 50 (H) 02/04/2020 0937   CREATININE 12.31 (H) 01/21/2021 1545   CALCIUM 6.7 (L) 01/21/2021 1545   CALCIUM 7.2 (L) 05/29/2018 0500   PROT 6.2 (L) 01/21/2021 1545   ALBUMIN 2.8 (L) 01/21/2021 1545   AST 11 (L) 01/21/2021 1545   ALT 10 01/21/2021 1545   ALKPHOS 106 01/21/2021 1545   BILITOT 0.6 01/21/2021 1545   GFRNONAA 5 (L) 01/21/2021 1545   GFRAA 7 (L) 02/04/2020 0937    Pertinent Imaging: CT ABDOMEN PELVIS WO CONTRAST  Result Date: 01/21/2021 CLINICAL DATA:  Flank pain and hematuria. EXAM: CT ABDOMEN AND PELVIS WITHOUT CONTRAST TECHNIQUE: Multidetector CT imaging of the abdomen and pelvis was performed following the standard protocol without IV contrast. COMPARISON:  December 30, 2020 FINDINGS: Lower chest: Mild bronchial wall thickening.  Scattered subsegmental atelectasis. Hepatobiliary: No suspicious hepatic lesion on this noncontrast examination. Gallbladder surgically absent. No biliary ductal dilation. Pancreas: Diffuse pancreatic atrophy with multifocal calcifications consistent with sequela of chronic pancreatitis. Slight interval decrease in size of the well-circumscribed low-attenuation collection in the hepatic tail which now measures 2.6 cm on image 30/3 previously 2.9 cm common almost certainly reflecting a pseudocyst. Spleen: Similar mild splenomegaly with the spleen measuring 15.2 cm in maximum craniocaudal dimension Adrenals/Urinary Tract: Right adrenal gland appears normal. Left adrenal gland is not visualized and may be surgically  absent. Postsurgical change of prior left nephrectomy with similar residual soft tissue thickening in the nephrectomy bed in comparison to prior studies. 15 mm stone is again seen in the right renal pelvis with additional smaller nonobstructive renal stones. Similar chronic right perinephric stranding. Urinary bladder is decompressed limiting evaluation. Stomach/Bowel: No enteric contrast was administered. Stomach is unremarkable for degree of distension. No pathologic dilation of large or small bowel. No evidence of acute bowel inflammation. Vascular/Lymphatic: Subcentimeter/pericentimeter retroperitoneal lymph nodes measuring up to 1 cm in short axis on image 25/3 appear unchanged. Scattered aortic atherosclerosis without abdominal aortic aneurysm. Reproductive: Prostate is unremarkable. Other: Similar trace abdominopelvic free fluid. No walled off fluid collection. No pneumoperitoneum. Similar mild body wall edema. Prior mesh repair of an umbilical hernia. Musculoskeletal: Multilevel degenerative changes spine. Prior ORIF of a left femoral neck fracture. No acute osseous abnormality. IMPRESSION: 1. Similar changes of chronic pancreatitis with a palpable small pseudocyst in the pancreatic tail. 2.  Unchanged 15 mm stone in the right renal pelvis, with chronic urothelial thickening and perinephric/peripelvic stranding. Additional smaller nonobstructive right renal stones. 3. Postsurgical change of prior left nephrectomy with residual soft tissue thickening in the nephrectomy bed and prominent retroperitoneal lymph nodes, not significantly changed dating back to August 2021. 4. Similar trace abdominopelvic free fluid and subcutaneous edema. 5.  Aortic Atherosclerosis (ICD10-I70.0). Electronically Signed   By: Dahlia Bailiff M.D.   On: 01/21/2021 18:47   DG Chest Port 1 View  Result Date: 01/21/2021 CLINICAL DATA:  covid+, missed dialysis, SOB EXAM: PORTABLE CHEST 1 VIEW COMPARISON:  08/04/2020 chest radiograph. FINDINGS: Stable cardiomediastinal silhouette with mild cardiomegaly. No pneumothorax. No pleural effusion. No overt pulmonary edema. Minimal scattered curvilinear opacities at the left costophrenic angle. No consolidative airspace disease. IMPRESSION: Stable mild cardiomegaly without overt pulmonary edema. Minimal scattered curvilinear opacities at the left costophrenic angle, favor minimal scarring or atelectasis. Electronically Signed   By: Ilona Sorrel M.D.   On: 01/21/2021 18:25    EKG: personally reviewed my interpretation is unchanged from previous tracings  ASSESSMENT & PLAN:  Assessment: Principal Problem:   Metabolic acidosis   Unique Searfoss is a 50 y.o. with pertinent PMH of ESRD on HD MWF, T2DM, NASH cirrhosis, gout, hx of seizures, who presents to [facility] from [facility] after missing 2 sessions of HD due to diarrhea, nausea, and vomiting 2/2 to Covid and admit for hyperkalemia 2/2 to missing HD on hospital day 0.  Plan: #Hyperkalemia #ESRD on HD (MWF) s/p left nephrectomy #COVID + #Anion Gap Metabolic Acidosis #Anemia Initiated on HD 04.2020.  Patient missed MW HD due to COVID symptoms. He states that his symptoms including nausea and vomiting are  improving.  He does have chronic diarrhea (takes lactulose daily). K+ elevated at 6.4 with Bicarb of 11, Anion gap of 18, and Hgb 10.2 with MCV of 105.1.  He was given [facility] and Nephrology consulted with plans for urgent HD this evening. Cycle threshold for RT-PCR at 36.8 indicating patient has low viral level and he was symptomatic beginning 12/24. He states that his symptoms are improving.    -Urgent HD planned for 12/29, but need for isolation due to COVID+ test. -follow-up CMP -Sodium bicarb 650 mg TID  #IDDM Hx of DKA in 07/22. Patient takes Glargine 25 units qhs, Tradjenta 5 mg qd and SSI at Nursing home.  Bicarb at 11 with AG of 18.  Last A1c of 10.3 03/22. A1c rechecked at 8.6. Beta-hydroxy wnl.  -F/u  on CMP -SSI -Glargine 10 units qhs -Hold Tradjenta  #Laceration to inferior meatus of penis Patient reports noticing blood on his penis yesterday when he was having a BM.  On exam, small 72m cut present to inferior meatus with dried blood present.  -Bacitracin ointment BID -F/u on UA  #History of Epilepsy Patient reports that he had a seizure about 2 days ago.  [facility]  -Continue carbamazepine  -Phenobarbital 259.2 mg qhs -carbamazepine level -Phenobarb level  #Abscess to lower back Reports that abscess has been present for about 2 months. He is afebrile with WBC wnl. Abscess lanced in ED. Will start doxycycline,  -Doxycycline 100 mg 1/5 days  #Nonobstructing nephrolithaisis #BPH #Gross hematuria CT abd/pelvis showed 15 mm stone in right renal pelvis with additional smaller nonobstructive right renal stones. Gross hematuria likely secondary to renal stones.  Hx of obstructing right ureteral stone with stent placed and removed in 2021. Home medication include tamsulosin. He does not currently have pain. UA negative for nitrites and leukocytes, +Hgb.  -Outpatient urology follow-up, on chart review last appt with urology was 02/21 for  removal of stent.  #Chronic Pancreatitis CT abd/pelvis with chronic pancreatitis with palpable small pseudocyst in the pancreatic tail.  #NASH Cirrhosis #Thrombocytopenia Platelet count of 85.  -Continue home medication of lactulose  #COPD #Tobacco use disorder On chart review no prior PFTs.  -Continue home medication of Combivent 1 puff q 6 hrs  #Ichthyosis vulgaris  #HFpEF #HTN Last EF 55-65% 02/22 on pharmacologic stress test.   #HLD #Aortic Atherosclerosis  -Simvastatin 20 mg  #MDD Home medication includes Duloxetine 20 mg qd  -Continue duloxetine 20 mg  #Gout Home medication includes Allopurinol 100 mg qd.  #GERD -Continue home medication of Omeprazole 20 mg  Best Practice: Diet: Renal diet IVF: Fluids: none Code: Full AB: none Status: Observation with expected length of stay less than 2 midnights. [facility]tic[facility] stability  Signature: KDaleen Bo Amiee Wiley, D.O.  Internal Medicine Resident, PGY-1 MZ[facility] PontesInternal Medicine Residency  Pager: [phone] PM, 01/21/2021   Please contact the on call pager after 5 pm and on weekends at [phone]

## 2021-01-21 NOTE — ED Notes (Signed)
Pt placed on bedpan to have BM

## 2021-01-21 NOTE — ED Provider Notes (Signed)
[facility] EMERGENCY DEPARTMENT Provider Note   CSN: [id number] Arrival date & time: 01/21/21  1449     History Chief Complaint  Patient presents with   Hematuria   Abscess    Eddie Davies is a 50 y.o. male.  Patient from nursing facility with history of ESRD on dialysis Monday Wednesday Friday, diabetes, cirrhosis --presents to the emergency department for evaluation of multiple complaints.  Patient has missed his past 2 dialysis sessions (last session 1 week ago).  He states that he just was not feeling well.  Reports episodes of vomiting and diarrhea.  Patient reports urinating blood yesterday having some mild back pain.  He also reports having ongoing "cyst" on one of his buttocks over the past several months.  No drainage from the cyst.  He denies fevers.  He tested positive for COVID yesterday, stating that there were other people at his facility that were sick.  He denies significant cough or shortness of breath.  The onset of this condition was acute. The course is constant. Aggravating factors: none. Alleviating factors: none.        Past Medical History:  Diagnosis Date   Anxiety disorder, unspecified    Cirrhosis ([place]) 04/2018   Diabetes mellitus without complication ([place])    type II   Enterocolitis due to Clostridium difficile, not specified as recurrent 08/09/2018   admission   Epilepsy, unspecified, not intractable, without status epilepticus ([place])    ESRD (end stage renal disease) ([place])    MWF    Gout, unspecified    Heart failure, unspecified ([place])    Hypertension    Klebsiella pneumoniae (k. pneumoniae) as the cause of diseases classified elsewhere 06/25/2018   admission   Liver failure ([place])    Major depressive disorder, single episode, unspecified    Metabolic acidosis 72/5366   Pneumonia    Polyneuropathy, unspecified    Tobacco use    Vitamin D deficiency     Patient Active Problem List   Diagnosis Date Noted   DKA  (diabetic ketoacidosis) ([place]) 08/04/2020   Hypertension    Diabetes mellitus without complication (HCC)    Tobacco use    Polyneuropathy, unspecified    Pneumonia    Major depressive disorder, single episode, unspecified    Liver failure (HCC)    Heart failure, unspecified ([place])    Gout, unspecified    ESRD (end stage renal disease) ([place])    Epilepsy, unspecified, not intractable, without status epilepticus ([place])    Anxiety disorder, unspecified    Chest pain, unspecified 01/27/2020   Other fluid overload 04/10/2019   Allergy, unspecified, initial encounter 04/06/2019   Retroperitoneal abscess ([place]) 03/13/2019   Infection due to ESBL-producing Klebsiella pneumoniae 03/13/2019   Personal history of COVID-19 02/25/2019   Postprocedural hematoma of a genitourinary system organ or structure following a genitourinary system procedure 02/14/2019   Coagulopathy ([place]) 02/06/2019   Failure to thrive in adult 01/21/2019   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   Protein-calorie malnutrition, severe ([place]) 01/21/2019   End stage liver disease ([place]) 01/09/2019   COVID-19 virus infection 01/09/2019   History of ESBL Klebsiella pneumoniae infection 01/09/2019   Septic shock ([place]) 01/09/2019   ESRD on hemodialysis ([place]) 12/27/2018   Gram negative sepsis ([place]) 12/27/2018   History of Clostridioides difficile colitis 12/27/2018   Anemia in chronic kidney disease 08/28/2018   Secondary hyperparathyroidism of renal origin ([place]) 08/28/2018   Enterocolitis due to Clostridium difficile, not specified as recurrent 08/09/2018  Pain, unspecified 07/09/2018   Klebsiella pneumoniae (k. pneumoniae) as the cause of diseases classified elsewhere 06/25/2018   Bacteremia due to Klebsiella pneumoniae 06/19/2018   Fever 06/18/2018   Anemia in ESRD (end-stage renal disease) ([place]) 06/18/2018   Anemia due to chronic kidney disease 06/18/2018   Leukocytosis 06/18/2018   Diarrhea, unspecified 06/08/2018    Dyspnea, unspecified 06/01/2018   Encounter for screening for respiratory tuberculosis 06/01/2018   Other cirrhosis of liver ([place]) 06/01/2018   Seizures ([place]) 05/24/2018   Benign prostatic hyperplasia without lower urinary tract symptoms 05/24/2018   Sepsis ([place]) 05/24/2018   Benign essential HTN 05/24/2018   Thrombocytopenia ([place]) 18/84/1660   Metabolic acidosis 63/01/6008   Metabolic bone disease 93/23/5573   Iron deficiency anemia 05/11/2018   Vitamin D deficiency 05/11/2018   Cirrhosis ([place]) 04/2018   Respiratory failure ([place])    Pressure injury of skin 04/21/2018   Altered mental status, unspecified 04/21/2018   HCAP (healthcare-associated pneumonia) 04/21/2018   Anemia due to chronic kidney disease, on chronic dialysis ([place]) 04/07/2018   Closed fracture of left upper extremity with routine healing 03/20/2018   Difficult intravenous access 03/16/2018   Delirium 03/14/2018   Hyperammonemia ([place]) 03/14/2018   Hypocalcemia 03/08/2018   Urinary calculus, unspecified 03/08/2018   Physical deconditioning 03/08/2018   Pressure injury of lower back, stage 2 ([place]) 03/08/2018   HTN (hypertension) 03/08/2018   Other hypoglycemia 03/08/2018   Closed intertrochanteric fracture of left femur ([place]) 01/21/2018   Fall 01/21/2018   Fracture of left ulna 01/21/2018   Fracture of left tibia 01/21/2018   Trauma 01/21/2018   Diabetic ulcer of toe associated with type 2 diabetes mellitus, limited to breakdown of skin ([place]) 02/14/2017   Hyperosmolar non-ketotic state in patient with type 2 diabetes mellitus ([place]) 02/14/2017   Anemia 12/08/2016   Diabetic ketoacidosis ([place]) 12/08/2016   GERD (gastroesophageal reflux disease) 12/08/2016   Hyponatremia 12/08/2016   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   Open wound of left foot except toes with complication 22/02/5425   Hyperlipidemia 06/22/2016   Type 2 diabetes mellitus with unspecified complications ([place]) 07/17/7626   Benign hypertension with  chronic kidney disease, stage III ([place]) 06/22/2016   Abscess of scrotum 09/19/2014   Cellulitis of toe of right foot 09/19/2014   Axillary abscess 02/03/2014   Hypokalemia 02/03/2014    Past Surgical History:  Procedure Laterality Date   APPENDECTOMY  1981   AV FISTULA PLACEMENT Right 10/23/2018   Procedure: ARTERIOVENOUS (AV) FISTULA CREATION RIGHT ARM;  Surgeon: [name], MD;  Location: [facility];  Service: Vascular;  Laterality: Right;   CHOLECYSTECTOMY     HERNIA REPAIR  2007   IR FLUORO GUIDE CV LINE RIGHT  05/30/2018   IR FLUORO GUIDE CV LINE RIGHT  03/18/2019   IR FLUORO GUIDE CV LINE RIGHT  03/22/2019   IR REMOVAL TUN CV CATH W/O FL  03/15/2019   IR SINUS/FIST TUBE CHK-NON GI  04/04/2019   IR US GUIDE VASC ACCESS RIGHT  05/30/2018   IR US GUIDE VASC ACCESS RIGHT  03/18/2019   IR US GUIDE VASC ACCESS RIGHT  03/22/2019   NEPHRECTOMY Left 2020       Family History  Problem Relation Age of Onset   Seizures Neg Hx     Social History   Tobacco Use   Smoking status: Some Days    Packs/day: 0.25    Types: Cigarettes   Smokeless tobacco: Never  Vaping Use   Vaping Use: Never used  Substance Use Topics   Alcohol use: Not Currently   Drug use: Never    Home Medications Prior to Admission medications   Medication Sig Start Date End Date Taking? Authorizing Provider  acetaminophen (TYLENOL) 325 MG tablet Take 650 mg by mouth every 8 (eight) hours as needed for fever (pain).    [provider]  allopurinol (ZYLOPRIM) 100 MG tablet Take 100 mg by mouth daily.  08/01/18   [provider]  Amino Acids-Protein Hydrolys (FEEDING SUPPLEMENT, PRO-STAT 64,) LIQD Take 30 mLs by mouth in the morning and at bedtime.    [provider]  calcium carbonate (TUMS - DOSED IN MG ELEMENTAL CALCIUM) 500 MG chewable tablet Chew 2,000 mg by mouth as directed. Give 4 tablet (2000 mg) TID & Give 5 tablets (2500 mg) on MWF 07/19/19   [provider]  carbamazepine  (TEGRETOL XR) 100 MG 12 hr tablet Take 100 mg by mouth 2 (two) times daily.    [provider]  carbamazepine (TEGRETOL XR) 400 MG 12 hr tablet Take 800 mg by mouth 2 (two) times daily.    [provider]  cyclobenzaprine (FLEXERIL) 5 MG tablet Take 5 mg by mouth daily as needed for muscle spasms. 12/02/19   [provider]  DULoxetine (CYMBALTA) 20 MG capsule Take 20 mg by mouth daily.    [provider]  insulin aspart (NOVOLOG) 100 UNIT/ML injection Inject 0-15 Units into the skin as directed. Inject 5 units daily with meals TID & Inject TID Per Sliding Scale with meals If 121-200=2 units, 201-250=5 units, 251-300=8 units, 301-350=11 units, 351-400=15 units >400 call MD    [provider]  Insulin Glargine (BASAGLAR KWIKPEN) 100 UNIT/ML Inject 25 Units into the skin at bedtime. 08/05/20 09/04/20  Erick Colace, NP  Ipratropium-Albuterol (COMBIVENT) 20-100 MCG/ACT AERS respimat Inhale 1 puff into the lungs every 6 (six) hours as needed for wheezing.    [provider]  ketoconazole (NIZORAL) 2 % shampoo Apply 1 application topically See admin instructions. APPLY TO SCALP (SHAMPOO) TOPICALLY EVERY DAY SHIFT EVERY TUE, FRI, SUN FOR DRY SCALP (BATH DAYS) APPLY TO SCALP 3X A WEEK & RINSE. 04/11/19   [provider]  lactulose (CHRONULAC) 10 GM/15ML solution Take 15 mLs (10 g total) by mouth 2 (two) times daily. 05/31/18   [name], Vernelle Emerald, MD  lidocaine-prilocaine (EMLA) cream Apply 1 application topically every Monday, Wednesday, and Friday.    [provider]  Multiple Vitamin (MULTIVITAMIN WITH MINERALS) TABS tablet Take 1 tablet by mouth at bedtime.    [provider]  Nutritional Supplements (NEPRO PO) Take 1 Bottle by mouth at bedtime.    [provider]  omeprazole (PRILOSEC) 20 MG capsule Take 20 mg by mouth daily.  07/04/18   [provider]  ondansetron (ZOFRAN) 4 MG tablet Take 4 mg by mouth every 8  (eight) hours as needed for nausea or vomiting.  08/03/18   [provider]  PHENobarbital (LUMINAL) 64.8 MG tablet Take 4 tablets (259.2 mg total) by mouth at bedtime. 12/05/18 08/04/20  [place] Donnamarie Poag, DO  polyethylene glycol (MIRALAX / GLYCOLAX) 17 g packet Take 17 g by mouth daily as needed. 03/23/19   Raiford Noble Latif, DO  promethazine (PHENERGAN) 25 MG/ML injection Inject 25 mg into the muscle every 6 (six) hours as needed for nausea or vomiting.    [provider]  simvastatin (ZOCOR) 20 MG tablet Take 20 mg by mouth at bedtime.  [provider]  tamsulosin (FLOMAX) 0.4 MG CAPS capsule Take 0.4 mg by mouth at bedtime.    [provider]  TRADJENTA 5 MG TABS tablet Take 5 mg by mouth daily. 08/19/19   [provider]    Allergies    Fish oil and Ethyl acetate  Review of Systems   Review of Systems  Constitutional:  Negative for chills and fever.  HENT:  Negative for rhinorrhea and sore throat.   Eyes:  Negative for redness.  Respiratory:  Positive for cough (occasional). Negative for shortness of breath.   Cardiovascular:  Negative for chest pain.  Gastrointestinal:  Negative for abdominal pain, diarrhea, nausea and vomiting.  Genitourinary:  Positive for flank pain and hematuria. Negative for dysuria.  Musculoskeletal:  Positive for back pain. Negative for myalgias.  Skin:  Positive for wound (abscess). Negative for rash.  Neurological:  Negative for headaches.   Physical Exam Updated Vital Signs BP (!) 179/88    Pulse 71    Temp 97.8 F (36.6 C) (Oral)    Resp 16    SpO2 100%   Physical Exam Vitals and nursing note reviewed.  Constitutional:      General: He is not in acute distress.    Appearance: He is well-developed.  HENT:     Head: Normocephalic and atraumatic.     Nose: No congestion.  Eyes:     General:        Right eye: No discharge.        Left eye: No discharge.     Conjunctiva/sclera: Conjunctivae normal.   Cardiovascular:     Rate and Rhythm: Normal rate and regular rhythm.     Heart sounds: Normal heart sounds.  Pulmonary:     Effort: Pulmonary effort is normal.     Breath sounds: Normal breath sounds.  Abdominal:     Palpations: Abdomen is soft.     Tenderness: There is no abdominal tenderness. There is no guarding or rebound.  Musculoskeletal:     Cervical back: Normal range of motion and neck supple.     Right lower leg: Edema present.     Left lower leg: Edema present.  Skin:    General: Skin is warm and dry.     Comments: Skin is dry and flaky (uremic frost?), cobblestone pattern on extremities  On the upper buttock there is a 5 cm fluid-filled area of induration with mild surrounding cellulitis.  Neurological:     Mental Status: He is alert.    ED Results / Procedures / Treatments   Labs (all labs ordered are listed, but only abnormal results are displayed) Labs Reviewed  COMPREHENSIVE METABOLIC PANEL - Abnormal; Notable for the following components:      Result Value   Potassium 6.4 (*)    CO2 11 (*)    Glucose, Bld 369 (*)    BUN 65 (*)    Creatinine, Ser 12.31 (*)    Calcium 6.7 (*)    Total Protein 6.2 (*)    Albumin 2.8 (*)    AST 11 (*)    GFR, Estimated 5 (*)    All other components within normal limits  CBC WITH DIFFERENTIAL/PLATELET - Abnormal; Notable for the following components:   RBC 3.12 (*)    Hemoglobin 10.2 (*)    HCT 32.8 (*)    MCV 105.1 (*)    Platelets 85 (*)    All other components within normal limits  RESP PANEL BY RT-PCR (  FLU A&B, COVID) ARPGX2  LACTIC ACID, PLASMA  URINALYSIS, ROUTINE W REFLEX MICROSCOPIC  CBG MONITORING, ED    EKG EKG Interpretation  Date/Time:  Thursday January 21 2021 15:32:55 EST Ventricular Rate:  76 PR Interval:    QRS Duration: 126 QT Interval:  436 QTC Calculation: 490 R Axis:   226 Text Interpretation: Wide QRS rhythm Right superior axis deviation Non-specific intra-ventricular conduction block  Abnormal ECG No significant change since last tracing Confirmed by Wandra Arthurs [phone]) on 01/21/2021 6:00:26 PM  Radiology No results found.  Procedures .Marland KitchenIncision and Drainage  Date/Time: 01/21/2021 7:44 PM Performed by: Carlisle Cater, [name] Authorized by: Carlisle Cater, [name]   Consent:    Consent obtained:  Verbal   Consent given by:  Patient   Risks discussed:  Bleeding and pain   Alternatives discussed:  No treatment Universal protocol:    Patient identity confirmed:  Verbally with patient Location:    Type:  Abscess   Size:  5cm   Location: R lower back/upper buttock. Pre-procedure details:    Skin preparation:  Povidone-iodine Sedation:    Sedation type:  None Anesthesia:    Anesthesia method:  Local infiltration   Local anesthetic:  Lidocaine 2% WITH epi Procedure type:    Complexity:  Complex Procedure details:    Incision types:  Stab incision   Wound management:  Probed and deloculated   Drainage:  Purulent   Drainage amount:  Copious   Wound treatment:  Drain placed   Packing materials:  1/4 in iodoform gauze Post-procedure details:    Procedure completion:  Tolerated well, no immediate complications   Medications Ordered in ED Medications  sodium zirconium cyclosilicate (LOKELMA) packet 10 g (10 g Oral Given 01/21/21 1829)  insulin aspart (novoLOG) injection 5 Units (5 Units Intravenous Given 01/21/21 1848)    And  dextrose 50 % solution 50 mL (50 mLs Intravenous Given 01/21/21 1847)  lidocaine-EPINEPHrine (XYLOCAINE W/EPI) 2 %-1:200000 (PF) injection 10 mL (10 mLs Infiltration Given by Other 01/21/21 1830)    ED Course  I have reviewed the triage vital signs and the nursing notes.  Pertinent labs & imaging results that were available during my care of the patient were reviewed by me and considered in my medical decision making (see chart for details).  Patient seen and examined. Plan discussed with patient.  Labs ordered in triage reviewed.   Potassium noted to be 6.4.  Bicarb also low.  EKG reviewed --no T wave changes but widened QRS slightly, present on previous EKGs.  Labs: Added COVID testing  Imaging: Added chest x-ray  Medications/Fluids: IV insulin and dextrose, Lokelma  Vital signs reviewed and are as follows: BP (!) 179/88    Pulse 71    Temp 97.8 F (36.6 C) (Oral)    Resp 16    SpO2 100%   Initial impression: Hyperkalemia due to missed dialysis sessions, positive COVID, abscess. Doubt DKA based on overall symptoms, but bicarb is low. Uremia could be contributing.  May benefit from insulin drip. Will monitor blood sugar.   7:44 PM Abscess drained. Paging nephrology in regards to dialysis.   CT reviewed, no acute findings, chronic renal stone on right.   8:15 PM I discussed patient with on-call nephrology, Dr. Posey Pronto.  He will see patient tonight.  Plan for dialysis and observation.  Discussed with internal medicine teaching service who will see patient.  Eddie Davies was evaluated in Emergency Department on 01/21/2021 for the symptoms described in the  history of present illness. He was evaluated in the context of the global COVID-19 pandemic, which necessitated consideration that the patient might be at risk for infection with the SARS-CoV-2 virus that causes COVID-19. Institutional protocols and algorithms that pertain to the evaluation of patients at risk for COVID-19 are in a state of rapid change based on information released by regulatory bodies including the CDC and federal and state organizations. These policies and algorithms were followed during the patient's care in the ED.    MDM Rules/Calculators/A&P                          Admit.     Final Clinical Impression(s) / ED Diagnoses Final diagnoses:  Hyperkalemia  ESRD (end stage renal disease) on dialysis ([place])  Gross hematuria  Cutaneous abscess of buttock  Hyperglycemia    Rx / DC Orders ED Discharge Orders     None         Carlisle Cater, [name] 01/21/21 2019    Drenda Freeze, MD 01/22/21 [phone]

## 2021-01-21 NOTE — ED Triage Notes (Addendum)
Pt to triage via [facility] from [facility] in [place].  Reports hematuria since yesterday.  COVID + yesterday.  Dialysis MWF.  Missed Monday and Wednesday.  Last dialysis on Friday.  CBG 434.  Denies SOB, chest pain, and dysuria.  Reports diarrhea x 2 weeks.  Alert and oriented x 4.  Pt also reports abscess on buttocks x 4 months.  Denies fever and chills.

## 2021-01-21 NOTE — ED Provider Notes (Signed)
Emergency Medicine Provider Triage Evaluation Note  Sly Parlee , a 50 y.o. male  was evaluated in triage.  Pt complains of hematuria x last night. Goes to dialysis MWF, missed Monday and Wednesday. Diagnosed with covid yesterday. No meds tried at home. Denies abdominal pain, dysuria, fever, chills, nausea, vomiting, chest pain, shortness of breath.   Review of Systems  Positive: hematuria Negative: Abdominal pain, dysuria  Physical Exam  BP (!) 147/80 (BP Location: Left Arm)    Pulse 74    Temp 97.8 F (36.6 C) (Oral)    Resp 16    SpO2 100%  Gen:   Awake, no distress   Resp:  Normal effort  MSK:   Moves extremities without difficulty  Other:  4 cm mass to right lower back with increased warmth, TTP, and erythema. Mild TTP to area. No chest wall TTP.   Medical Decision Making  Medically screening exam initiated at 4:28 PM.  Appropriate orders placed.  Garner Nash Berry was informed that the remainder of the evaluation will be completed by another provider, this initial triage assessment does not replace that evaluation, and the importance of remaining in the ED until their evaluation is complete.  4:29 PM - Discussed with RN that patient is in need of a room immediately. RN aware and working on room placement.    Shekelia Boutin A, [name] 01/21/21 1633    Wyvonnia Dusky, MD 01/21/21 1740

## 2021-01-21 NOTE — Consult Note (Signed)
Reason for Consult: Hyperkalemia in patient with end-stage renal disease Referring Physician: Shirlyn Goltz MD (EDP)  HPI:  50 year old man with past medical history significant for hypertension, anxiety/depression, type 2 diabetes mellitus, NASH cirrhosis, gout and end-stage renal disease on hemodialysis on a Monday/Wednesday/Friday schedule.  Presents with complaints of feeling poorly for the past week with some episodic nausea, vomiting and diarrhea and a single episode of bloody urine.  Missed his last 2 dialysis treatments because he was feeling bad.  He denies any respiratory symptoms but had positive contacts with COVID-19 and tested positive yesterday.  He denies any chest pain or shortness of breath at this time.  When seen in the emergency room, his only complaint is that of wanting to have a bowel movement and waiting on a bedpan.  Earlier done labs show potassium of 6.4, bicarbonate level of 11 with a glucose of 369 and no evidence of EKG changes.  Dialysis prescription: [facility], Monday/Wednesday/Friday, 4 hours 15 minutes, 180 dialyzer, BFR 400, DFR 500, EDW 102 kg, 2K/3.0 calcium, right upper arm AV fistula, heparin 1500 unit bolus, Mircera 100 mcg every 2 weeks, Hectorol 3 mcg IV 3 times weekly.  Past Medical History:  Diagnosis Date   Anxiety disorder, unspecified    Cirrhosis ([place]) 04/2018   Diabetes mellitus without complication ([place])    type II   Enterocolitis due to Clostridium difficile, not specified as recurrent 08/09/2018   admission   Epilepsy, unspecified, not intractable, without status epilepticus ([place])    ESRD (end stage renal disease) ([place])    MWF    Gout, unspecified    Heart failure, unspecified ([place])    Hypertension    Klebsiella pneumoniae (k. pneumoniae) as the cause of diseases classified elsewhere 06/25/2018   admission   Liver failure ([place])    Major depressive disorder, single episode, unspecified    Metabolic acidosis 95/3202   Pneumonia     Polyneuropathy, unspecified    Tobacco use    Vitamin D deficiency     Past Surgical History:  Procedure Laterality Date   APPENDECTOMY  1981   AV FISTULA PLACEMENT Right 10/23/2018   Procedure: ARTERIOVENOUS (AV) FISTULA CREATION RIGHT ARM;  Surgeon: [name], MD;  Location: [place];  Service: Vascular;  Laterality: Right;   CHOLECYSTECTOMY     HERNIA REPAIR  2007   IR FLUORO GUIDE CV LINE RIGHT  05/30/2018   IR FLUORO GUIDE CV LINE RIGHT  03/18/2019   IR FLUORO GUIDE CV LINE RIGHT  03/22/2019   IR REMOVAL TUN CV CATH W/O FL  03/15/2019   IR SINUS/FIST TUBE CHK-NON GI  04/04/2019   IR US GUIDE VASC ACCESS RIGHT  05/30/2018   IR US GUIDE VASC ACCESS RIGHT  03/18/2019   IR US GUIDE VASC ACCESS RIGHT  03/22/2019   NEPHRECTOMY Left 2020    Family History  Problem Relation Age of Onset   Seizures Neg Hx     Social History:  reports that he has been smoking cigarettes. He has been smoking an average of .25 packs per day. He has never used smokeless tobacco. He reports that he does not currently use alcohol. He reports that he does not use drugs.  Allergies:  Allergies  Allergen Reactions   Fish Oil Swelling and Other (See Comments)        Ethyl Acetate    Omega-3-Acid Ethyl Esters     Medications: I have reviewed the patient's current medications. Scheduled:  [START ON 01/22/2021] Chlorhexidine  Gluconate Cloth  6 each Topical Q0600   sodium zirconium cyclosilicate  10 g Oral Daily    BMP Latest Ref Rng & Units 01/21/2021 08/05/2020 08/05/2020  Glucose 70 - 99 mg/dL 369(H) 62(L) -  BUN 6 - 20 mg/dL 65(H) 31(H) -  Creatinine 0.61 - 1.24 mg/dL 12.31(H) 7.07(H) -  BUN/Creat Ratio 9 - 20 - - -  Sodium 135 - 145 mmol/L 135 133(L) 135  Potassium 3.5 - 5.1 mmol/L 6.4(HH) 3.2(L) 3.2(L)  Chloride 98 - 111 mmol/L 109 100 -  CO2 22 - 32 mmol/L 11(L) 22 -  Calcium 8.9 - 10.3 mg/dL 6.7(L) 7.1(L) -   CBC Latest Ref Rng & Units 01/21/2021 08/05/2020 08/05/2020  WBC 4.0 - 10.5 K/uL 6.2 4.7  -  Hemoglobin 13.0 - 17.0 g/dL 10.2(L) 9.0(L) 8.2(L)  Hematocrit 39.0 - 52.0 % 32.8(L) 26.4(L) 24.0(L)  Platelets 150 - 400 K/uL 85(L) 66(L) -     CT ABDOMEN PELVIS WO CONTRAST  Result Date: 01/21/2021 CLINICAL DATA:  Flank pain and hematuria. EXAM: CT ABDOMEN AND PELVIS WITHOUT CONTRAST TECHNIQUE: Multidetector CT imaging of the abdomen and pelvis was performed following the standard protocol without IV contrast. COMPARISON:  December 30, 2020 FINDINGS: Lower chest: Mild bronchial wall thickening. Scattered subsegmental atelectasis. Hepatobiliary: No suspicious hepatic lesion on this noncontrast examination. Gallbladder surgically absent. No biliary ductal dilation. Pancreas: Diffuse pancreatic atrophy with multifocal calcifications consistent with sequela of chronic pancreatitis. Slight interval decrease in size of the well-circumscribed low-attenuation collection in the hepatic tail which now measures 2.6 cm on image 30/3 previously 2.9 cm common almost certainly reflecting a pseudocyst. Spleen: Similar mild splenomegaly with the spleen measuring 15.2 cm in maximum craniocaudal dimension Adrenals/Urinary Tract: Right adrenal gland appears normal. Left adrenal gland is not visualized and may be surgically absent. Postsurgical change of prior left nephrectomy with similar residual soft tissue thickening in the nephrectomy bed in comparison to prior studies. 15 mm stone is again seen in the right renal pelvis with additional smaller nonobstructive renal stones. Similar chronic right perinephric stranding. Urinary bladder is decompressed limiting evaluation. Stomach/Bowel: No enteric contrast was administered. Stomach is unremarkable for degree of distension. No pathologic dilation of large or small bowel. No evidence of acute bowel inflammation. Vascular/Lymphatic: Subcentimeter/pericentimeter retroperitoneal lymph nodes measuring up to 1 cm in short axis on image 25/3 appear unchanged. Scattered aortic  atherosclerosis without abdominal aortic aneurysm. Reproductive: Prostate is unremarkable. Other: Similar trace abdominopelvic free fluid. No walled off fluid collection. No pneumoperitoneum. Similar mild body wall edema. Prior mesh repair of an umbilical hernia. Musculoskeletal: Multilevel degenerative changes spine. Prior ORIF of a left femoral neck fracture. No acute osseous abnormality. IMPRESSION: 1. Similar changes of chronic pancreatitis with a palpable small pseudocyst in the pancreatic tail. 2. Unchanged 15 mm stone in the right renal pelvis, with chronic urothelial thickening and perinephric/peripelvic stranding. Additional smaller nonobstructive right renal stones. 3. Postsurgical change of prior left nephrectomy with residual soft tissue thickening in the nephrectomy bed and prominent retroperitoneal lymph nodes, not significantly changed dating back to August 2021. 4. Similar trace abdominopelvic free fluid and subcutaneous edema. 5.  Aortic Atherosclerosis (ICD10-I70.0). Electronically Signed   By: Dahlia Bailiff M.D.   On: 01/21/2021 18:47   DG Chest Port 1 View  Result Date: 01/21/2021 CLINICAL DATA:  covid+, missed dialysis, SOB EXAM: PORTABLE CHEST 1 VIEW COMPARISON:  08/04/2020 chest radiograph. FINDINGS: Stable cardiomediastinal silhouette with mild cardiomegaly. No pneumothorax. No pleural effusion. No overt pulmonary edema. Minimal scattered  curvilinear opacities at the left costophrenic angle. No consolidative airspace disease. IMPRESSION: Stable mild cardiomegaly without overt pulmonary edema. Minimal scattered curvilinear opacities at the left costophrenic angle, favor minimal scarring or atelectasis. Electronically Signed   By: Ilona Sorrel M.D.   On: 01/21/2021 18:25    Review of Systems  Constitutional:  Positive for activity change, appetite change and fatigue. Negative for chills and fever.  HENT:  Negative for nosebleeds, sinus pressure, sinus pain and sore throat.   Eyes:   Negative for pain, redness and visual disturbance.  Respiratory:  Negative for cough, chest tightness and shortness of breath.   Cardiovascular:  Negative for chest pain and leg swelling.  Gastrointestinal:  Positive for diarrhea, nausea and vomiting. Negative for abdominal pain and blood in stool.  Endocrine: Negative for cold intolerance and heat intolerance.  Genitourinary:  Positive for hematuria. Negative for dysuria and frequency.  Musculoskeletal:  Positive for back pain. Negative for joint swelling and myalgias.  Skin:  Positive for wound.       Has a boil around his bottom  Neurological:  Positive for weakness and headaches.  Blood pressure (!) 146/86, pulse 77, temperature 97.8 F (36.6 C), temperature source Oral, resp. rate 13, SpO2 99 %. Physical Exam Vitals and nursing note reviewed.  Constitutional:      Appearance: Normal appearance. He is obese.  HENT:     Head: Normocephalic and atraumatic.     Right Ear: External ear normal.     Left Ear: External ear normal.     Mouth/Throat:     Mouth: Mucous membranes are dry.     Pharynx: Oropharynx is clear.  Eyes:     Extraocular Movements: Extraocular movements intact.     Conjunctiva/sclera: Conjunctivae normal.  Cardiovascular:     Rate and Rhythm: Normal rate and regular rhythm.     Pulses: Normal pulses.  Pulmonary:     Effort: Pulmonary effort is normal.     Breath sounds: Normal breath sounds. No wheezing or rales.  Abdominal:     General: Bowel sounds are normal.     Palpations: Abdomen is soft.     Tenderness: There is no abdominal tenderness. There is no guarding.  Musculoskeletal:     Cervical back: Normal range of motion and neck supple.     Right lower leg: Edema present.     Left lower leg: Edema present.     Comments: 1+ bilateral lower extremity edema with venous stasis skin changes.  Right upper arm AV fistula with palpable thrill  Skin:    General: Skin is warm and dry.     Findings: Rash present.      Comments: Diffuse hyperpigmentation with venous stasis changes  Neurological:     Mental Status: He is alert and oriented to person, place, and time.    Assessment/Plan: 1.  Hyperkalemia: Secondary to missed dialysis, will undertake hemodialysis today.  He has earlier received some medical temporizing measures for potassium shifting as well as Lokelma to help GI dumping. 2.  End-stage renal disease: Discussed the importance of adherence with dialysis particularly when he is feeling poorly.  Undertake urgent hemodialysis overnight. 3.  Hypertension: Hold antihypertensive therapy and monitor with hemodialysis/ultrafiltration.  Counseled regarding interdialytic weight gain. 4.  Anemia of chronic disease: Hemoglobin and hematocrit currently at goal, reported some transient hematuria but without any significant hemoglobin drop.  We will continue to follow trend. 5.  COVID-19 infection: Symptomatic management, no indications for corticosteroids or additional  therapy based on respiratory parameters. 6.  Chronic kidney disease-metabolic bone disease: Resume renal diet and restart binders along with Hectorol  Krisi Azua K. 01/21/2021, 8:39 PM

## 2021-01-21 NOTE — Hospital Course (Addendum)
Sick since Saturday. N/V  When pooping penis bleed. Make sa little bit of urine.   This happened yesterday when trying to have bowel movement. No pain in his groin.  Has diarrhea constantly.    No HA, cp, SOB, non dysuria.  Esrd unclear why sEIZURE d/o  T2DM   Phenobarb  Carbamazepine   4 years in April on  2 days ago had seizure  Mostly in whel chair throughout the day, SNF helps with showering and feeding.    Pack a day 20 years No alcohol   curr

## 2021-01-22 DIAGNOSIS — U071 COVID-19: Secondary | ICD-10-CM

## 2021-01-22 DIAGNOSIS — L0231 Cutaneous abscess of buttock: Secondary | ICD-10-CM | POA: Diagnosis not present

## 2021-01-22 DIAGNOSIS — Z992 Dependence on renal dialysis: Secondary | ICD-10-CM

## 2021-01-22 DIAGNOSIS — R739 Hyperglycemia, unspecified: Secondary | ICD-10-CM

## 2021-01-22 DIAGNOSIS — E872 Acidosis, unspecified: Secondary | ICD-10-CM | POA: Diagnosis not present

## 2021-01-22 DIAGNOSIS — E875 Hyperkalemia: Principal | ICD-10-CM

## 2021-01-22 DIAGNOSIS — N186 End stage renal disease: Secondary | ICD-10-CM

## 2021-01-22 LAB — BETA-HYDROXYBUTYRIC ACID: Beta-Hydroxybutyric Acid: 0.15 mmol/L (ref 0.05–0.27)

## 2021-01-22 LAB — BASIC METABOLIC PANEL
Anion gap: 17 — ABNORMAL HIGH (ref 5–15)
BUN: 70 mg/dL — ABNORMAL HIGH (ref 6–20)
CO2: 11 mmol/L — ABNORMAL LOW (ref 22–32)
Calcium: 6.7 mg/dL — ABNORMAL LOW (ref 8.9–10.3)
Chloride: 111 mmol/L (ref 98–111)
Creatinine, Ser: 12.53 mg/dL — ABNORMAL HIGH (ref 0.61–1.24)
GFR, Estimated: 4 mL/min — ABNORMAL LOW (ref >60)
Glucose, Bld: 90 mg/dL (ref 70–99)
Potassium: 6 mmol/L — ABNORMAL HIGH (ref 3.5–5.1)
Sodium: 139 mmol/L (ref 135–145)

## 2021-01-22 LAB — RENAL FUNCTION PANEL
Albumin: 2.7 g/dL — ABNORMAL LOW (ref 3.5–5.0)
Anion gap: 17 — ABNORMAL HIGH (ref 5–15)
BUN: 65 mg/dL — ABNORMAL HIGH (ref 6–20)
CO2: 11 mmol/L — ABNORMAL LOW (ref 22–32)
Calcium: 6.9 mg/dL — ABNORMAL LOW (ref 8.9–10.3)
Chloride: 110 mmol/L (ref 98–111)
Creatinine, Ser: 12.3 mg/dL — ABNORMAL HIGH (ref 0.61–1.24)
GFR, Estimated: 5 mL/min — ABNORMAL LOW (ref >60)
Glucose, Bld: 128 mg/dL — ABNORMAL HIGH (ref 70–99)
Phosphorus: >30 mg/dL — ABNORMAL HIGH (ref 2.5–4.6)
Potassium: 6.1 mmol/L — ABNORMAL HIGH (ref 3.5–5.1)
Sodium: 138 mmol/L (ref 135–145)

## 2021-01-22 LAB — CARBAMAZEPINE LEVEL, TOTAL: Carbamazepine Lvl: 3.4 ug/mL — ABNORMAL LOW (ref 4.0–12.0)

## 2021-01-22 LAB — URINALYSIS, ROUTINE W REFLEX MICROSCOPIC
Bilirubin Urine: NEGATIVE
Glucose, UA: 250 mg/dL — AB
Ketones, ur: NEGATIVE mg/dL
Leukocytes,Ua: NEGATIVE
Nitrite: NEGATIVE
Protein, ur: >300 mg/dL — AB
Specific Gravity, Urine: 1.02 (ref 1.005–1.030)
pH: 7 (ref 5.0–8.0)

## 2021-01-22 LAB — URINALYSIS, MICROSCOPIC (REFLEX)
RBC / HPF: >50 RBC/hpf (ref 0–5)
WBC, UA: NONE SEEN WBC/hpf (ref 0–5)

## 2021-01-22 LAB — PHENOBARBITAL LEVEL: Phenobarbital: 12.3 ug/mL — ABNORMAL LOW (ref 15.0–30.0)

## 2021-01-22 LAB — GLUCOSE, CAPILLARY
Glucose-Capillary: 157 mg/dL — ABNORMAL HIGH (ref 70–99)
Glucose-Capillary: 72 mg/dL (ref 70–99)
Glucose-Capillary: 111 mg/dL — ABNORMAL HIGH (ref 70–99)
Glucose-Capillary: 147 mg/dL — ABNORMAL HIGH (ref 70–99)
Glucose-Capillary: 142 mg/dL — ABNORMAL HIGH (ref 70–99)

## 2021-01-22 LAB — VITAMIN B12: Vitamin B-12: 265 pg/mL (ref 180–914)

## 2021-01-22 LAB — FOLATE: Folate: 13.3 ng/mL (ref >5.9)

## 2021-01-22 MED ORDER — INSULIN ASPART 100 UNIT/ML IV SOLN
5.0000 [IU] | Freq: Once | INTRAVENOUS | Status: AC
  Administered 2021-01-22: 04:00:00 5 [IU] via INTRAVENOUS

## 2021-01-22 MED ORDER — DEXTROSE 50 % IV SOLN
1.0000 | Freq: Once | INTRAVENOUS | Status: AC
  Administered 2021-01-22: 04:00:00 50 mL via INTRAVENOUS
  Filled 2021-01-22: qty 50

## 2021-01-22 MED ORDER — RENA-VITE PO TABS
1.0000 | ORAL_TABLET | Freq: Every day | ORAL | Status: DC
Start: 2021-01-22 — End: 2021-01-24
  Administered 2021-01-22 – 2021-01-23 (×2): 1 via ORAL
  Filled 2021-01-22 (×2): qty 1

## 2021-01-22 MED ORDER — DOXERCALCIFEROL 4 MCG/2ML IV SOLN: 3.0000 ug | INTRAVENOUS | Status: DC

## 2021-01-22 MED ORDER — ALBUTEROL SULFATE (2.5 MG/3ML) 0.083% IN NEBU: 10.0000 mg | INHALATION_SOLUTION | Freq: Once | RESPIRATORY_TRACT | Status: DC

## 2021-01-22 MED ORDER — SODIUM ZIRCONIUM CYCLOSILICATE 10 G PO PACK
10.0000 g | PACK | Freq: Three times a day (TID) | ORAL | Status: AC
  Administered 2021-01-22 (×3): 10 g via ORAL
  Filled 2021-01-22 (×3): qty 1

## 2021-01-22 MED ORDER — CALCIUM CARBONATE 1250 (500 CA) MG PO TABS
4.0000 | ORAL_TABLET | Freq: Three times a day (TID) | ORAL | Status: DC
  Administered 2021-01-22 – 2021-01-24 (×5): 2000 mg via ORAL
  Filled 2021-01-22 (×6): qty 2

## 2021-01-22 MED ORDER — PHENOBARBITAL 97.2 MG PO TABS
259.2000 mg | ORAL_TABLET | Freq: Once | ORAL | Status: AC
  Administered 2021-01-22: 01:00:00 259.2 mg via ORAL
  Filled 2021-01-22: qty 2

## 2021-01-22 MED ORDER — PHENOBARBITAL 97.2 MG PO TABS
259.2000 mg | ORAL_TABLET | Freq: Every day | ORAL | Status: DC
  Administered 2021-01-22 – 2021-01-23 (×2): 259.2 mg via ORAL
  Filled 2021-01-22 (×2): qty 2

## 2021-01-22 MED ORDER — LOPERAMIDE HCL 2 MG PO CAPS
4.0000 mg | ORAL_CAPSULE | ORAL | Status: DC | PRN
  Administered 2021-01-22 (×2): 4 mg via ORAL
  Filled 2021-01-22 (×2): qty 2

## 2021-01-22 NOTE — Progress Notes (Signed)
Pt receives out-pt HD at [facility] on MWF. Pt arrives at 11:10. Spoke to [place], Agricultural consultant, at clinic who is aware pt is covid positive. Pt will resume his normal out-pt HD days/time at d/c. Clinic aware pt should d/c to snf over weekend to resume at clinic on Monday.   Melven Sartorius Renal Navigator [phone]

## 2021-01-22 NOTE — Evaluation (Signed)
Physical Therapy Evaluation Patient Details Name: Eddie Davies MRN: [id number] DOB: 11-26-1970 Today's Date: 01/22/2021  History of Present Illness  50 yo male presents to [facility] on 12/30 from [facility] with hematuria, covid +. PMH includes ESRD on HD MWF, DM, seizures, cirrhosis, anxiety, HTN, neuropathy.  Clinical Impression   Pt presents with generalized weakness, buttocks discomfort secondary to wound, difficulty performing mobility tasks, impaired balance, and decreased activity tolerance vs baseline. Pt to benefit from acute PT to address deficits. Pt mobilizing at min-mod assist level, tolerating lateral stepping at EOB only secondary to dizziness (BP 114/70). PT to progress mobility as tolerated, and will continue to follow acutely.         Recommendations for follow up therapy are one component of a multi-disciplinary discharge planning process, led by the attending physician.  Recommendations may be updated based on patient status, additional functional criteria and insurance authorization.  Follow Up Recommendations Skilled nursing-short term rehab (<3 hours/day) (vs LTC - unsure of pt's bed status at facility)    Assistance Recommended at Discharge Frequent or constant Supervision/Assistance  Functional Status Assessment Patient has had a recent decline in their functional status and demonstrates the ability to make significant improvements in function in a reasonable and predictable amount of time.  Equipment Recommendations  None recommended by PT    Recommendations for Other Services       Precautions / Restrictions Precautions Precautions: Fall Precaution Comments: pressure wound R buttocks Restrictions Weight Bearing Restrictions: No      Mobility  Bed Mobility Overal bed mobility: Needs Assistance Bed Mobility: Supine to Sit;Sit to Supine     Supine to sit: Min assist;HOB elevated Sit to supine: Mod assist;HOB elevated   General bed  mobility comments: min assist for trunk elevation and scoot to EOB with bed pads, mod assist for LE lifting into bed.    Transfers Overall transfer level: Needs assistance Equipment used: Rolling walker (2 wheels) Transfers: Sit to/from Stand Sit to Stand: Min assist;From elevated surface           General transfer comment: assist for rise and steady, lateral steps towards HOB only given dizziness. BP 114/70    Ambulation/Gait               General Gait Details: lateral stepping towards [facility]  Stairs            Wheelchair Mobility    Modified Rankin (Stroke Patients Only)       Balance Overall balance assessment: Needs assistance Sitting-balance support: Feet supported Sitting balance-Leahy Scale: Good     Standing balance support: Bilateral upper extremity supported;During functional activity;Reliant on assistive device for balance Standing balance-Leahy Scale: Poor                               Pertinent Vitals/Pain Pain Assessment: Faces Faces Pain Scale: Hurts little more Pain Location: buttocks Pain Descriptors / Indicators: Discomfort;Grimacing Pain Intervention(s): Limited activity within patient's tolerance;Monitored during session;Repositioned    Home Living Family/patient expects to be discharged to:: Skilled nursing facility                   Additional Comments: resident at [facility], states he gets therapies    Prior Function Prior Level of Function : Needs assist       Physical Assist : Mobility (physical);ADLs (physical) Mobility (physical): Gait;Transfers ADLs (physical): Grooming;Bathing;Dressing;Toileting;IADLs Mobility Comments: pt states he was  working on his walking with RW at SNF ADLs Comments: Pt reports he receives assist from staff for most all ADLs, can feed self     Hand Dominance   Dominant Hand: Left    Extremity/Trunk Assessment   Upper Extremity Assessment Upper Extremity Assessment:  Defer to OT evaluation    Lower Extremity Assessment Lower Extremity Assessment: Generalized weakness    Cervical / Trunk Assessment Cervical / Trunk Assessment: Normal  Communication   Communication: No difficulties  Cognition Arousal/Alertness: Awake/alert Behavior During Therapy: WFL for tasks assessed/performed;Flat affect Overall Cognitive Status: No family/caregiver present to determine baseline cognitive functioning                                          General Comments      Exercises General Exercises - Lower Extremity Long Arc Quad: AROM;Both;5 reps;Seated   Assessment/Plan    PT Assessment Patient needs continued PT services  PT Problem List Decreased strength;Decreased safety awareness;Decreased mobility;Decreased activity tolerance;Decreased knowledge of use of DME;Decreased balance;Pain;Decreased skin integrity;Impaired sensation       PT Treatment Interventions DME instruction;Therapeutic activities;Gait training;Therapeutic exercise;Patient/family education;Balance training;Functional mobility training;Neuromuscular re-education    PT Goals (Current goals can be found in the Care Plan section)  Acute Rehab PT Goals Patient Stated Goal: back to SNF PT Goal Formulation: With patient Time For Goal Achievement: 02/05/21 Potential to Achieve Goals: Good    Frequency Min 2X/week   Barriers to discharge        Co-evaluation               AM-PAC PT "6 Clicks" Mobility  Outcome Measure Help needed turning from your back to your side while in a flat bed without using bedrails?: A Little Help needed moving from lying on your back to sitting on the side of a flat bed without using bedrails?: A Lot Help needed moving to and from a bed to a chair (including a wheelchair)?: A Lot Help needed standing up from a chair using your arms (e.g., wheelchair or bedside chair)?: A Little Help needed to walk in hospital room?: A Little Help needed  climbing 3-5 steps with a railing? : A Lot 6 Click Score: 15    End of Session   Activity Tolerance: Patient tolerated treatment well;Patient limited by fatigue Patient left: in bed;with bed alarm set;with call bell/phone within reach Nurse Communication: Mobility status PT Visit Diagnosis: Difficulty in walking, not elsewhere classified (R26.2);Other abnormalities of gait and mobility (R26.89)    Time: 3710-6269 PT Time Calculation (min) (ACUTE ONLY): 20 min   Charges:   PT Evaluation $PT Eval Low Complexity: 1 Low        Drelyn Pistilli S, PT DPT Acute Rehabilitation Services Pager [phone]  Office [phone]   [place] E Ruffin Pyo 01/22/2021, 11:25 AM

## 2021-01-22 NOTE — TOC Initial Note (Signed)
Transition of Care [facility]) - Initial/Assessment Note    Patient Details  Name: Eddie Davies MRN: [id number] Date of Birth: 11/28/1970  Transition of Care [facility]) CM/SW Contact:    Emeterio Reeve, LCSW Phone Number: 01/22/2021, 3:13 PM  Clinical Narrative:                  Pt is LTC at [facility]. They are able to accept pt at DC. Pt does not need a FL2 or covid test per facility.    Expected Discharge Plan: Long Term Nursing Home Barriers to Discharge: SNF Pending discharge orders, Continued Medical Work up   Patient Goals and CMS Choice Patient states their goals for this hospitalization and ongoing recovery are:: return to LTC CMS Medicare.gov Compare Post Acute Care list provided to:: Patient Choice offered to / list presented to : Patient  Expected Discharge Plan and Services Expected Discharge Plan: [place]       Living arrangements for the past 2 months: [place]                                      Prior Living Arrangements/Services Living arrangements for the past 2 months: [place] Lives with:: Facility Resident Patient language and need for interpreter reviewed:: Yes Do you feel safe going back to the place where you live?: Yes      Need for Family Participation in Patient Care: Yes (Comment) Care giver support system in place?: No (comment)   Criminal Activity/Legal Involvement Pertinent to Current Situation/Hospitalization: No - Comment as needed  Activities of Daily Living Home Assistive Devices/Equipment: Eyeglasses, Wheelchair ADL Screening (condition at time of admission) Patient's cognitive ability adequate to safely complete daily activities?: Yes Is the patient deaf or have difficulty hearing?: No Does the patient have difficulty seeing, even when wearing glasses/contacts?: Yes Does the patient have difficulty concentrating, remembering, or making decisions?: No Patient able to  express need for assistance with ADLs?: Yes Does the patient have difficulty dressing or bathing?: No Independently performs ADLs?: Yes (appropriate for developmental age) Does the patient have difficulty walking or climbing stairs?: Yes Weakness of Legs: Both Weakness of Arms/Hands: None  Permission Sought/Granted                  Emotional Assessment   Attitude/Demeanor/Rapport: Unable to Assess Affect (typically observed): Unable to Assess Orientation: : Oriented to Self, Oriented to Place, Oriented to  Time, Oriented to Situation Alcohol / Substance Use: Not Applicable Psych Involvement: No (comment)  Admission diagnosis:  Hyperkalemia [X90.2] Metabolic acidosis [I09.73] Hyperglycemia [R73.9] Gross hematuria [R31.0] ESRD (end stage renal disease) on dialysis (HCC) [N18.6, Z99.2] Cutaneous abscess of buttock [L02.31] COVID-19 virus infection [U07.1] Patient Active Problem List   Diagnosis Date Noted   Cutaneous abscess of buttock    Hyperglycemia    Hyperkalemia    DKA (diabetic ketoacidosis) ([place]) 08/04/2020   Hypertension    Diabetes mellitus without complication (HCC)    Tobacco use    Polyneuropathy, unspecified    Pneumonia    Major depressive disorder, single episode, unspecified    Liver failure (HCC)    Heart failure, unspecified (HCC)    Gout, unspecified    ESRD (end stage renal disease) on dialysis (HCC)    Epilepsy, unspecified, not intractable, without status epilepticus ([place])    Anxiety disorder, unspecified    Chest pain,  unspecified 01/27/2020   Other fluid overload 04/10/2019   Allergy, unspecified, initial encounter 04/06/2019   Retroperitoneal abscess ([place]) 03/13/2019   Infection due to ESBL-producing Klebsiella pneumoniae 03/13/2019   Personal history of COVID-19 02/25/2019   Postprocedural hematoma of a genitourinary system organ or structure following a genitourinary system procedure 02/14/2019   Coagulopathy ([place]) 02/06/2019   Failure  to thrive in adult 01/21/2019   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   Protein-calorie malnutrition, severe ([place]) 01/21/2019   End stage liver disease ([place]) 01/09/2019   COVID-19 virus infection 01/09/2019   History of ESBL Klebsiella pneumoniae infection 01/09/2019   Septic shock ([place]) 01/09/2019   ESRD on hemodialysis ([place]) 12/27/2018   Gram negative sepsis ([place]) 12/27/2018   History of Clostridioides difficile colitis 12/27/2018   Anemia in chronic kidney disease 08/28/2018   Secondary hyperparathyroidism of renal origin ([place]) 08/28/2018   Enterocolitis due to Clostridium difficile, not specified as recurrent 08/09/2018   Pain, unspecified 07/09/2018   Klebsiella pneumoniae (k. pneumoniae) as the cause of diseases classified elsewhere 06/25/2018   Bacteremia due to Klebsiella pneumoniae 06/19/2018   Fever 06/18/2018   Anemia in ESRD (end-stage renal disease) ([place]) 06/18/2018   Anemia due to chronic kidney disease 06/18/2018   Leukocytosis 06/18/2018   Diarrhea, unspecified 06/08/2018   Dyspnea, unspecified 06/01/2018   Encounter for screening for respiratory tuberculosis 06/01/2018   Other cirrhosis of liver ([place]) 06/01/2018   Seizures ([place]) 05/24/2018   Benign prostatic hyperplasia without lower urinary tract symptoms 05/24/2018   Sepsis ([place]) 05/24/2018   Benign essential HTN 05/24/2018   Thrombocytopenia ([place]) 01/16/8249   Metabolic acidosis 03/70/4888   Metabolic bone disease 91/69/4503   Iron deficiency anemia 05/11/2018   Vitamin D deficiency 05/11/2018   Cirrhosis ([place]) 04/2018   Respiratory failure ([place])    Pressure injury of skin 04/21/2018   Altered mental status, unspecified 04/21/2018   HCAP (healthcare-associated pneumonia) 04/21/2018   Anemia due to chronic kidney disease, on chronic dialysis ([place]) 04/07/2018   Closed fracture of left upper extremity with routine healing 03/20/2018   Difficult intravenous access 03/16/2018   Delirium 03/14/2018    Hyperammonemia ([place]) 03/14/2018   Hypocalcemia 03/08/2018   Urinary calculus, unspecified 03/08/2018   Physical deconditioning 03/08/2018   Pressure injury of lower back, stage 2 ([place]) 03/08/2018   HTN (hypertension) 03/08/2018   Other hypoglycemia 03/08/2018   Closed intertrochanteric fracture of left femur ([place]) 01/21/2018   Fall 01/21/2018   Fracture of left ulna 01/21/2018   Fracture of left tibia 01/21/2018   Trauma 01/21/2018   Diabetic ulcer of toe associated with type 2 diabetes mellitus, limited to breakdown of skin ([place]) 02/14/2017   Hyperosmolar non-ketotic state in patient with type 2 diabetes mellitus ([place]) 02/14/2017   Anemia 12/08/2016   Diabetic ketoacidosis ([place]) 12/08/2016   GERD (gastroesophageal reflux disease) 12/08/2016   Hyponatremia 12/08/2016   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   Open wound of left foot except toes with complication 88/82/8003   Hyperlipidemia 06/22/2016   Type 2 diabetes mellitus with unspecified complications ([place]) 49/17/9150   Benign hypertension with chronic kidney disease, stage III ([place]) 06/22/2016   Abscess of scrotum 09/19/2014   Cellulitis of toe of right foot 09/19/2014   Axillary abscess 02/03/2014   Hypokalemia 02/03/2014   PCP:  Practice, [place]:   [address], [address] [place] [address] Phone: [phone] Fax: [phone]     Social Determinants of  Health (SDOH) Interventions    Readmission Risk Interventions Readmission Risk Prevention Plan 03/19/2019 11/29/2018 05/28/2018  Transportation Screening Complete Complete -  Medication Review Press photographer) Referral to Pharmacy Complete -  PCP or Specialist appointment within 3-5 days of discharge Not Complete Not Complete -  PCP/Specialist Appt Not Complete comments LTC SNF resident pt is SNF resident -  [place] or [place] Not Complete Not Complete Complete  HRI or Home Care  Consult Pt Refusal Comments LTC SNF resident pt is SNF resident -  SW Recovery Care/Counseling Consult Complete Complete Complete  Palliative Care Screening Not Applicable Not Applicable -  [place] Complete Complete -  Some recent data might be hidden

## 2021-01-22 NOTE — Progress Notes (Signed)
Mobility Specialist Progress Note:   01/22/21 1500  Mobility  Activity Ambulated to bathroom  Level of Assistance Minimal assist, patient does 75% or more  Assistive Device Front wheel walker  Distance Ambulated (ft) 30 ft  Mobility Out of bed for toileting  Mobility Response Tolerated well  Mobility performed by Mobility specialist  $Mobility charge 1 Mobility   Requesting to go to BR. BM successful. Pt requiring minA to power up from bed/toilet with BSC over. Min guard required during ambulation. No dizziness, or pain during amb. Left pt in bed with all needs met.   Nelta Numbers Mobility Specialist  Phone [phone]

## 2021-01-22 NOTE — Progress Notes (Signed)
Pt CBG--157, Pt does not want Insulin glargine-yfgn  10 units subcutaneous if blood sugar is less than 200.On call Dr Howie Ill made aware.

## 2021-01-22 NOTE — Progress Notes (Signed)
Initial Nutrition Assessment  DOCUMENTATION CODES:   Not applicable  INTERVENTION:  - Encourage PO intake - Double protein portions - Recommend diet liberalization from renal/carb modified to renal diet - Renal MVI with minerals daily  NUTRITION DIAGNOSIS:   Increased nutrient needs related to chronic illness (ESRD and cirrhosis) as evidenced by estimated needs.  GOAL:   Patient will meet greater than or equal to 90% of their needs  MONITOR:   PO intake, Supplement acceptance, Diet advancement, Labs, Weight trends, I & O's  REASON FOR ASSESSMENT:   Malnutrition Screening Tool    ASSESSMENT:   Pt admitted from [facility] COVID+ causing him to miss dialysis. PMH includes ESRD on HD MWF s/p L nephrectomy, IDDM, NASH cirrhosis, gout, hx or seizures.  Noted plans for pt to receive HD today.  Pt reports having a good appetite. He states that on dialysis days (MWF) he only eats 2 meals per day. He does not eat snacks or protein shakes as he does not enjoy them. On non-dialysis days he will typically eat 3 meals per day. He states that the facility he lives in provides double protein portions for him. RD to order double protein portions for pt to encourage adequate nutritional intake.  Pt reports a dry weight of 103 kg and denies any recent weight loss. Current weight: 112.6 kg  Medications: doxycycline, SSI, semglee 10 units daily, protonix, sodium bicarbonate, lokelma  Labs: potassium 6.0, BUN 70, Cr 12.53, phos >30.0, HgbA1c 8.6%, CBG's 72-184 x24 hours  UOP: 181m x 12 hours I/O's: +5035msince admission  NUTRITION - FOCUSED PHYSICAL EXAM: Deferred to follow-up  Diet Order:   Diet Order             Diet renal/carb modified with fluid restriction Diet-HS Snack? Nothing; Fluid restriction: 1800 mL Fluid; Room service appropriate? Yes; Fluid consistency: Thin  Diet effective now                   EDUCATION NEEDS:   Not appropriate for  education at this time  Skin:  Skin Assessment: Skin Integrity Issues: Skin Integrity Issues:: Incisions Incisions: R buttock  Last BM:  01/22/21  Height:   Ht Readings from Last 1 Encounters:  05/05/20 6' 2"  (1.88 m)    Weight:   Wt Readings from Last 1 Encounters:  01/22/21 112.6 kg    Ideal Body Weight:  86.4 kg  BMI:  Body mass index is 31.87 kg/m.  Estimated Nutritional Needs:   Kcal:  2400-2600  Protein:  120-135g  Fluid:  1L + UOP  AlClayborne DanaRDN, LDN Clinical Nutrition

## 2021-01-22 NOTE — Discharge Summary (Addendum)
Name: Eddie Davies MRN: [id number] DOB: [birth year] 50 y.o. PCP: Practice, [facility]  Date of Admission: 01/21/2021  3:13 PM Date of Discharge: 01/24/21 Attending Physician: Eddie Falcon, MD  Discharge Diagnosis: 1. Hyperkalemia, anion gap metabolic acidosis d/t missed HD session 2. Abscess of lower back 3. COVID-19 infection 4. Laceration on penis 5. Ichthyosis vulgaris 6. T2DM 7. Anemia of chronic disease 8. History of epilepsy 9. Chronic pancreatitis 10. NASH cirrhosis, thrombocytopenia 11. COPD; tobacco Use disorder 12 HFpEF 13.HLD; aortic atherosclerosis 14. MDD 15. Gout 16. GERD 17. Nonobstructing nephrolithaisis; BPH; Gross hematuria  Discharge Medications: Allergies as of 01/24/2021       Reactions   Fish Oil Swelling, Other (See Comments)      Ethyl Acetate    Omega-3-acid Ethyl Esters         Medication List     TAKE these medications    acetaminophen 325 MG tablet Commonly known as: TYLENOL Take 650 mg by mouth every 8 (eight) hours as needed for fever (pain).   allopurinol 100 MG tablet Commonly known as: ZYLOPRIM Take 100 mg by mouth daily.   ascorbic acid 500 MG tablet Commonly known as: VITAMIN C Take 500 mg by mouth in the morning and at bedtime. For 30 days   Basaglar KwikPen 100 UNIT/ML Inject 25 Units into the skin at bedtime.   calcium carbonate 500 MG chewable tablet Commonly known as: TUMS - dosed in mg elemental calcium Chew 2,000-2,500 mg by mouth See admin instructions. Give 2000 mg three times a day, and an additional 2500 mg once daily on Monday's, Wednesday's, and Friday's.   carbamazepine 400 MG 12 hr tablet Commonly known as: TEGRETOL XR Take 800 mg by mouth 2 (two) times daily. Take with 100 mg dose for a total daily dose of 900 mg twice daily   carbamazepine 100 MG 12 hr tablet Commonly known as: TEGRETOL XR Take 100 mg by mouth 2 (two) times daily. Take with 800 mg dose for a total daily dose  of 900 mg twice daily   cyclobenzaprine 5 MG tablet Commonly known as: FLEXERIL Take 5 mg by mouth daily as needed for muscle spasms.   doxycycline 100 MG tablet Commonly known as: VIBRA-TABS Take 1 tablet (100 mg total) by mouth every 12 (twelve) hours for 4 days.   DULoxetine 20 MG capsule Commonly known as: CYMBALTA Take 20 mg by mouth daily.   feeding supplement (NEPRO CARB STEADY) Liqd Take 220 mLs by mouth at bedtime.   feeding supplement (PRO-STAT 64) Liqd Take 30 mLs by mouth in the morning and at bedtime.   Glucagon HCl 1 MG Solr Inject 1 mg into the muscle as needed (BG <70).   Glucose 77.4 % Gel Take 1 Dose by mouth as needed (BG <70).   guaiFENesin 600 MG 12 hr tablet Commonly known as: MUCINEX Take 600 mg by mouth every 12 (twelve) hours. For 30 days   insulin aspart 100 UNIT/ML injection Commonly known as: novoLOG Inject 3-15 Units into the skin See admin instructions. 5 units three times daily with meals as well as an additional 3 to 15 units three times daily with meals per sliding scale.   Ipratropium-Albuterol 20-100 MCG/ACT Aers respimat Commonly known as: COMBIVENT Inhale 1 puff into the lungs every 6 (six) hours as needed for wheezing.   ketoconazole 2 % cream Commonly known as: NIZORAL Apply 1 application topically 3 (three) times a week. Apply to scalp on bath days  lactulose 10 GM/15ML solution Commonly known as: CHRONULAC Take 15 mLs (10 g total) by mouth 2 (two) times daily. What changed: additional instructions   lidocaine-prilocaine cream Commonly known as: EMLA Apply 1 application topically every Monday, Wednesday, and Friday.   multivitamin with minerals Tabs tablet Take 1 tablet by mouth daily.   omeprazole 20 MG capsule Commonly known as: PRILOSEC Take 20 mg by mouth daily.   PHENobarbital 64.8 MG tablet Commonly known as: LUMINAL Take 4 tablets (259.2 mg total) by mouth at bedtime.   simvastatin 20 MG tablet Commonly  known as: ZOCOR Take 20 mg by mouth at bedtime.   tamsulosin 0.4 MG Caps capsule Commonly known as: FLOMAX Take 0.4 mg by mouth at bedtime.   Tradjenta 5 MG Tabs tablet Generic drug: linagliptin Take 5 mg by mouth daily.   zinc sulfate 220 (50 Zn) MG capsule Take 220 mg by mouth every evening. For 30 days        Disposition and follow-up:   Eddie Davies was discharged from [facility] in Stable condition.  At the Davies follow up visit please address:  1. Hyperkalemia; hyperphosphatemia: Please ensure patient is going to his HD sessions and r/p BMP and phos.  2. Abscess of lower back: Patient to continue doxycycline 151m twice daily through 01/26/2021. Please check for resolution of symptoms.  Please also remove packing in 24 hours.  Wound can then heal by secondary intention.   3. Laceration on penis: Monitor for any changes or further bleeding.  4. Hematuria- ensure patient has outpatient urology follow up  5.  Labs / imaging needed at time of follow-up: BMP, phosphorus  6.  Pending labs/ test needing follow-up: n/a  Follow-up Appointments:  Follow-up Information     Practice, Eddie Davies Schedule an appointment as soon as possible for a visit in 1 week(s).   Why: Please return to your primary care physician in about a week for a Davies follow-up. Contact information: [phone]        TBerniece Salines DO .   Specialty: Cardiology Contact information: [phone]                [facility] Schedule an appointment as soon as possible for a visit in 1 week(s).   Why: Please return to your primary care physician in about a week for a Davies follow-up. Contact information: [phone]        TBerniece Salines DO .   Specialty:  Cardiology Contact information: [id number] by [facility] list: 1. Hyperkalemia, AGMA d/t missed HD session; [facility]phosphatemia [facility]. Prior to arrival patient had missed his past two dialysis sessions due to not feeling well. Initial work-up revealed patient was hypertensive and found to be hyperkalemic to 6.4 and acidotic with bicarb of 11. No acute changes on ECG. Nephrology was subsequently consulted. He was given insulin and Lokelma and his potassium improved to 6.0. In addition, he was started on sodium bicarbonate as well as renvela 800 TID. He completed HD on 12/31 though his potassium was still elevated. He was given another dose of lokelma and  his potassium normalized by 01/24/21. Patient to continue regularly scheduled HD sessions. Renvela will need to be titrated as an outpatient.   2. COVID-19 infection: During initial work-up in the ED, patient was found to be COVID-19 positive. He remained hemodynamically stable with no respiratory issues throughout admission. Given this, treatment with steroids and antivirals were held. Likely his COVID-19 status contributed to him not feeling well and missing his HD sessions.   3. Abscess of lower back: Patient was found to have 5cm abscess on lower right back. Patient reports this has been in place for two months. He arrived afebrile and without leukocytosis. In ED, abscess was lanced and drained. He was subsequently started on doxycycline. Patient instructed to continue antibiotics for 5 days total, last day of antibiotics 01/26/2021.  Please remove packing at SNF.   4. Laceration on penis Patient reports he noticed blood coming from his penis after having a bowel movement the day prior to arrival. Exam revealed small 65m cut on inferior meatus. Patient was initiated on Bacitracin ointment twice daily.   5.  Non-obstructing nephrolithiasis CT abdomen pelvis obtained in ED revealed 122mstone in R renal pelvis with additional non-obstructive stones in R ureter. He is currently on tamsulosin. Mr. PhAlperinlso has a history of nephrolithiasis s/p stent placement and removal (02/21). Patient was asymptomatic during this admission.  6. Ichthyosis vulgaris  Found on exam on admission. Patient asymptomatic, no pruritis. If patient becomes symptomatic can consider referral to dermatology.  7. History of Epilepsy Home AEDs were continued  8. Anemia of chronic disease H/H  stable at 10. No transfusion required  9. T2DM Patient will resume home glargine and tradjenta  10. Chronic Pancreatitis CT abd/pelvis with chronic pancreatitis with palpable small pseudocyst in the pancreatic tail.  11. COPD; Tobacco use disorder Continued home combivent 1 puff q 6 hrs  12. HFpEF; HTN Last EF 55-65% 02/22 on pharmacologic stress test.   13. HLD; Aortic Atherosclerosis Continued Simvastatin 20 mg   14. MDD Continued duloxetine 20 mg   15. Gout Continued Allopurinol 100 mg qd.   16. GERD Continued home medication of Omeprazole 20 mg  Discharge subjective  Patient is resting in bed comfortably . He says that his nausea and vomiting have improved. Main complaint overnight was his neuropathy. He says that dialysis went ok yesterday. He is ok with going back to his facility.  Discharge Exam:   BP (!) 147/83 (BP Location: Left Arm)    Pulse 66    Temp 98.1 F (36.7 C) (Oral)    Resp 18    Wt 109 kg    SpO2 100%    BMI 30.85 kg/m  Constitutional: obese, chronically ill appearing male sleeping comfortably in bed, in no acute distress Pulmonary/Chest: normal work of breathing on room air Abdominal: soft, non-tender, non-distended MSK: normal bulk and tone Neurological: alert, answering questions appropriately Skin: hyperkeratotic adherent dry scaling thickest along the bilateral lower extremities but  present diffusely Psych: normal affect    Pertinent Labs, Studies, and Procedures:  CBC Latest Ref Rng & Units 01/24/2021 01/23/2021 01/21/2021  WBC 4.0 - 10.5 K/uL 3.6(L) 4.5 6.2  Hemoglobin 13.0 - 17.0 g/dL 8.6(L) 8.4(L) 10.2(L)  Hematocrit 39.0 - 52.0 % 25.7(L) 27.0(L) 32.8(L)  Platelets 150 - 400 K/uL 82(L) 82(L) 85(L)    BMP Latest Ref Rng & Units 01/24/2021 01/23/2021 01/23/2021  Glucose 70 - 99 mg/dL 336(H) - 247(H)  BUN 6 - 20 mg/dL 56(H) - 78(H)  Creatinine  0.61 - 1.24 mg/dL 9.75(H) - 13.39(H)  BUN/Creat Ratio 9 - 20 - - -  Sodium 135 - 145 mmol/L 135 - 135  Potassium 3.5 - 5.1 mmol/L 4.7 5.5(H) 6.2(H)  Chloride 98 - 111 mmol/L 103 - 107  CO2 22 - 32 mmol/L 19(L) - 11(L)  Calcium 8.9 - 10.3 mg/dL 7.2(L) - 6.7(L)    CT Abdomen/Pelvis 01/21/2021 Similar changes of chronic pancreatitis with a palpable small pseudocyst in the pancreatic tail. Unchanged 15 mm stone in the right renal pelvis, with chronic urothelial thickening and perinephric/peripelvic stranding. Additional smaller nonobstructive right renal stones. Postsurgical change of prior left nephrectomy with residual soft tissue thickening in the nephrectomy bed and prominent retroperitoneal lymph nodes, not significantly changed dating back to August 2021. Similar trace abdominopelvic free fluid and subcutaneous edema. Aortic Atherosclerosis (ICD10-I70.0).  Chest XR 01/21/2021 Stable mild cardiomegaly without overt pulmonary edema. Minimal scattered curvilinear opacities at the left costophrenic angle, favor minimal scarring or atelectasis.  Discharge Instructions: Discharge Instructions     Call MD for:  difficulty breathing, headache or visual disturbances   Complete by: As directed    Call MD for:  difficulty breathing, headache or visual disturbances   Complete by: As directed    Call MD for:  extreme fatigue   Complete by: As directed    Call MD for:  hives   Complete by: As directed    Call MD for:  persistant  dizziness or light-headedness   Complete by: As directed    Call MD for:  persistant nausea and vomiting   Complete by: As directed    Call MD for:  persistant nausea and vomiting   Complete by: As directed    Call MD for:  redness, tenderness, or signs of infection (pain, swelling, redness, odor or green/yellow discharge around incision site)   Complete by: As directed    Call MD for:  redness, tenderness, or signs of infection (pain, swelling, redness, odor or green/yellow discharge around incision site)   Complete by: As directed    Call MD for:  severe uncontrolled pain   Complete by: As directed    Call MD for:  temperature >100.4   Complete by: As directed    Diet - low sodium heart healthy   Complete by: As directed    Discharge instructions   Complete by: As directed    Mr. Riojas, I am glad you are feeling better! You were admitted because your potassium was very high. This is due to missing dialysis. It is VERY important for you to attend every dialysis session and not miss any further sessions. Please see the following notes:  - Continue with your regular HD schedule.  - For your abscess, please make sure to change the dressing within the next 24 hours. In addition, take doxycycline 146m twice daily through January 26, 2021.  It was a pleasure meeting you, Mr. PPellicane I wish you the best and hope you have a happy new year!  Thank you, PSanjuan Dame MD   Increase activity slowly   Complete by: As directed    Increase activity slowly   Complete by: As directed    No wound care   Complete by: As directed    Remove dressing in 24 hours   Complete by: As directed      Mr. PDommer I am glad you are feeling better! You were admitted because your potassium was very high. This is due to missing dialysis. It is  VERY important for you to attend every dialysis session and not miss any further sessions. Please see the following notes:  - Continue with your regular HD  schedule.  - For your abscess, please make sure to change the dressing within the next 24 hours. In addition, take doxycycline 1109m twice daily through January 26, 2021.  It was a pleasure meeting you, Mr. PBiglow I wish you the best and hope you have a happy new year!  Thank you, AScarlett Presto MD  Signed: DScarlett Presto MD 01/24/2021, 8:36 AM   Pager: [phone]

## 2021-01-22 NOTE — Progress Notes (Addendum)
HD#0 Subjective:  Overnight Events: NAEO   Patient says he is still having some nausea and vomiting as well as diarrhea but the diarrhea is not new. He says his foot hurts as well because of his neuropathy. His nose is also stuffed up.   Objective:  Vital signs in last 24 hours: Vitals:   01/21/21 2142 01/21/21 2355 01/22/21 0401 01/22/21 0852  BP:  126/75 (!) 117/54 104/62  Pulse: 80 83 81 72  Resp: 16 16 18 19   Temp:  98.1 F (36.7 C) 98.2 F (36.8 C) 98.4 F (36.9 C)  TempSrc:  Oral Oral Oral  SpO2: 99% 100% 100% 100%  Weight:   112.6 kg    Supplemental O2: Room Air SpO2: 100 %   Physical Exam:  Constitutional: obese, chronically ill appearing male resting comfortably in bed, in no acute distress Cardiovascular: regular rate and rhythm, no m/r/g Pulmonary/Chest: normal work of breathing on room air, lungs clear to auscultation bilaterally Abdominal: soft, non-tender, non-distended MSK: normal bulk and tone Neurological: alert & oriented x 3, answering questions appropriately Skin: hyperkeratotic adherent dry scaling thickest along the bilateral lower extremities but present diffusely Psych: normal affect  Filed Weights   01/22/21 0401  Weight: 112.6 kg     Intake/Output Summary (Last 24 hours) at 01/22/2021 1022 Last data filed at 01/22/2021 0859 Gross per 24 hour  Intake 360 ml  Output 100 ml  Net 260 ml   Net IO Since Admission: 260 mL [01/22/21 1022]  Pertinent Labs: CBC Latest Ref Rng & Units 01/21/2021 08/05/2020 08/05/2020  WBC 4.0 - 10.5 K/uL 6.2 4.7 -  Hemoglobin 13.0 - 17.0 g/dL 10.2(L) 9.0(L) 8.2(L)  Hematocrit 39.0 - 52.0 % 32.8(L) 26.4(L) 24.0(L)  Platelets 150 - 400 K/uL 85(L) 66(L) -    CMP Latest Ref Rng & Units 01/22/2021 01/21/2021 01/21/2021  Glucose 70 - 99 mg/dL 90 128(H) 369(H)  BUN 6 - 20 mg/dL 70(H) 65(H) 65(H)  Creatinine 0.61 - 1.24 mg/dL 12.53(H) 12.30(H) 12.31(H)  Sodium 135 - 145 mmol/L 139 138 135  Potassium 3.5 - 5.1  mmol/L 6.0(H) 6.1(H) 6.4(HH)  Chloride 98 - 111 mmol/L 111 110 109  CO2 22 - 32 mmol/L 11(L) 11(L) 11(L)  Calcium 8.9 - 10.3 mg/dL 6.7(L) 6.9(L) 6.7(L)  Total Protein 6.5 - 8.1 g/dL - - 6.2(L)  Total Bilirubin 0.3 - 1.2 mg/dL - - 0.6  Alkaline Phos 38 - 126 U/L - - 106  AST 15 - 41 U/L - - 11(L)  ALT 0 - 44 U/L - - 10    Imaging: CT ABDOMEN PELVIS WO CONTRAST  Result Date: 01/21/2021 CLINICAL DATA:  Flank pain and hematuria. EXAM: CT ABDOMEN AND PELVIS WITHOUT CONTRAST TECHNIQUE: Multidetector CT imaging of the abdomen and pelvis was performed following the standard protocol without IV contrast. COMPARISON:  December 30, 2020 FINDINGS: Lower chest: Mild bronchial wall thickening. Scattered subsegmental atelectasis. Hepatobiliary: No suspicious hepatic lesion on this noncontrast examination. Gallbladder surgically absent. No biliary ductal dilation. Pancreas: Diffuse pancreatic atrophy with multifocal calcifications consistent with sequela of chronic pancreatitis. Slight interval decrease in size of the well-circumscribed low-attenuation collection in the hepatic tail which now measures 2.6 cm on image 30/3 previously 2.9 cm common almost certainly reflecting a pseudocyst. Spleen: Similar mild splenomegaly with the spleen measuring 15.2 cm in maximum craniocaudal dimension Adrenals/Urinary Tract: Right adrenal gland appears normal. Left adrenal gland is not visualized and may be surgically absent. Postsurgical change of prior left nephrectomy with similar  residual soft tissue thickening in the nephrectomy bed in comparison to prior studies. 15 mm stone is again seen in the right renal pelvis with additional smaller nonobstructive renal stones. Similar chronic right perinephric stranding. Urinary bladder is decompressed limiting evaluation. Stomach/Bowel: No enteric contrast was administered. Stomach is unremarkable for degree of distension. No pathologic dilation of large or small bowel. No evidence of  acute bowel inflammation. Vascular/Lymphatic: Subcentimeter/pericentimeter retroperitoneal lymph nodes measuring up to 1 cm in short axis on image 25/3 appear unchanged. Scattered aortic atherosclerosis without abdominal aortic aneurysm. Reproductive: Prostate is unremarkable. Other: Similar trace abdominopelvic free fluid. No walled off fluid collection. No pneumoperitoneum. Similar mild body wall edema. Prior mesh repair of an umbilical hernia. Musculoskeletal: Multilevel degenerative changes spine. Prior ORIF of a left femoral neck fracture. No acute osseous abnormality. IMPRESSION: 1. Similar changes of chronic pancreatitis with a palpable small pseudocyst in the pancreatic tail. 2. Unchanged 15 mm stone in the right renal pelvis, with chronic urothelial thickening and perinephric/peripelvic stranding. Additional smaller nonobstructive right renal stones. 3. Postsurgical change of prior left nephrectomy with residual soft tissue thickening in the nephrectomy bed and prominent retroperitoneal lymph nodes, not significantly changed dating back to August 2021. 4. Similar trace abdominopelvic free fluid and subcutaneous edema. 5.  Aortic Atherosclerosis (ICD10-I70.0). Electronically Signed   By: Dahlia Bailiff M.D.   On: 01/21/2021 18:47   DG Chest Port 1 View  Result Date: 01/21/2021 CLINICAL DATA:  covid+, missed dialysis, SOB EXAM: PORTABLE CHEST 1 VIEW COMPARISON:  08/04/2020 chest radiograph. FINDINGS: Stable cardiomediastinal silhouette with mild cardiomegaly. No pneumothorax. No pleural effusion. No overt pulmonary edema. Minimal scattered curvilinear opacities at the left costophrenic angle. No consolidative airspace disease. IMPRESSION: Stable mild cardiomegaly without overt pulmonary edema. Minimal scattered curvilinear opacities at the left costophrenic angle, favor minimal scarring or atelectasis. Electronically Signed   By: Ilona Sorrel M.D.   On: 01/21/2021 18:25    Assessment/Plan:    Principal Problem:   Metabolic acidosis   Patient Summary: Eddie Davies is a 50 y.o. with pertinent PMH of ESRD on HD MWF, T2DM, NASH cirrhosis, gout, hx of seizures, who [facility] to [facility] after missing 2 sessions of HD due to diarrhea, nausea, and vomiting 2/2 to Covid and admit for hyperkalemia 2/2 to missing HD.  #Hyperkalemia #ESRD on HD (MWF) s/p left nephrectomy #Anion Gap Metabolic Acidosis Patient not yet able to get dialysis, has received medical temproizing measures for potassium as well as lokelma. Nephrology is planning for HD today. Potassium has remained largely stable at about 6. Spoke with nephrology about his phosphate as it looks like he is no longer on a phosphate binder. Will defer to them to add one back on, - nephology following, appreciate assistance - daily cmp -Sodium bicarb 650 mg TID, increased per nephrology  - phosphate binder per nephrology   #COVID19 Patient has some minor congestion but is otherwise doing well. He states that his symptoms are improving.  His symptoms started 12/24. Not requiring any oxygen. Will hold off on steroids remdesivir at this time.  - CTM  #History of Epilepsy Patient reports that he had a seizure about 2 days ago.  [facility] states that this did not occur. Will continue to monitor and if patient has any witnessed seizures can discuss adjusting his meds with neurology. -Continue carbamazepine  -Phenobarbital 259.2 mg qhs   #Anemia of chronic disease H/H  stable at 10. - transfuse <7 - daily  CBC  #T2DM Patient takes Glargine 25 units qhs, Tradjenta 5 mg qd and SSI at Nursing home. Sugars have been low normal this morning after insulin and glucose temporizing measures. Will CTM - A1c 8.6. -SSI -Glargine 10 units qhs -Hold Tradjenta   #Laceration to inferior meatus of penis -Bacitracin ointment BID  #Abscess to lower back Reports that abscess has been present for about 2  months. He is afebrile with WBC wnl. Abscess lanced in ED.  -Doxycycline 100 mg Q12 1/5 days   #Nonobstructing nephrolithaisis #BPH #Gross hematuria CT abd/pelvis showed 15 mm stone in right renal pelvis with additional smaller nonobstructive right renal stones. Gross hematuria likely secondary to renal stones.  Hx of obstructing right ureteral stone with stent placed and removed in 2021. Home medication include tamsulosin. He does not currently have pain. UA negative for nitrites and leukocytes, +Hgb. -Outpatient urology follow-up, on chart review last appt with urology was 02/21 for removal of stent.   #Chronic Pancreatitis CT abd/pelvis with chronic pancreatitis with palpable small pseudocyst in the pancreatic tail.   #NASH Cirrhosis #Thrombocytopenia Platelet count of 85. -Continue home medication of lactulose   #COPD #Tobacco use disorder On chart review no prior PFTs. -Continue home medication of Combivent 1 puff q 6 hrs    #HFpEF #HTN Last EF 55-65% 02/22 on pharmacologic stress test.    #HLD #Aortic Atherosclerosis -Simvastatin 20 mg   #MDD -Continue duloxetine 20 mg   #Gout Home medication includes Allopurinol 100 mg qd.   #GERD -Continue home medication of Omeprazole 20 mg  #Ichthyosis vulgaris  Diet: Renal IVF: None,None VTE: Heparin Code: Full  Scarlett Presto, MD Internal Medicine Resident PGY-1 Pager [phone] Please contact the on call pager after 5 pm and on weekends at [phone].

## 2021-01-22 NOTE — Progress Notes (Signed)
[facility] Progress Note   Subjective:   Pt seen in room, reports nausea this AM but denies SOB, orthopnea, CP and dizziness. Complains of chronic foot pain and left hand numbness/stiffness, has no prior HD access in that arm.   Objective Vitals:   01/21/21 2100 01/21/21 2142 01/21/21 2355 01/22/21 0401  BP: (!) 156/83  126/75 (!) 117/54  Pulse: 83 80 83 81  Resp:  16 16 18   Temp:   98.1 F (36.7 C) 98.2 F (36.8 C)  TempSrc:   Oral Oral  SpO2: 98% 99% 100% 100%  Weight:    112.6 kg   Physical Exam General: WDWN male, alert and in NAD Heart: RRR, no murmurs, rubs or gallops Lungs: CTA bilaterally without wheezing, rhonchi or rales Abdomen: Soft, non-distended, +BS Extremities: trace pitting edema b/l lower extremities Dialysis Access: RUE AVF + bruit  Additional Objective Labs: Basic Metabolic Panel: Recent Labs  Lab 01/21/21 1545 01/21/21 2319 01/22/21 0708  NA 135 138 139  K 6.4* 6.1* 6.0*  CL 109 110 111  CO2 11* 11* 11*  GLUCOSE 369* 128* 90  BUN 65* 65* 70*  CREATININE 12.31* 12.30* 12.53*  CALCIUM 6.7* 6.9* 6.7*  PHOS  --  >30.0*  --    Liver Function Tests: Recent Labs  Lab 01/21/21 1545 01/21/21 2319  AST 11*  --   ALT 10  --   ALKPHOS 106  --   BILITOT 0.6  --   PROT 6.2*  --   ALBUMIN 2.8* 2.7*   No results for input(s): LIPASE, AMYLASE in the last 168 hours. CBC: Recent Labs  Lab 01/21/21 1545  WBC 6.2  NEUTROABS 4.2  HGB 10.2*  HCT 32.8*  MCV 105.1*  PLT 85*   Blood Culture    Component Value Date/Time   SDES BLOOD LEFT FOREARM 08/04/2020 1305   SPECREQUEST  08/04/2020 1305    BOTTLES DRAWN AEROBIC ONLY Blood Culture results may not be optimal due to an inadequate volume of blood received in culture bottles   CULT  08/04/2020 1305    NO GROWTH 5 DAYS Performed at [facility], [place] [address] [place], [place] [address]    REPTSTATUS 08/09/2020 FINAL 08/04/2020 1305    Cardiac Enzymes: No results for  input(s): CKTOTAL, CKMB, CKMBINDEX, TROPONINI in the last 168 hours. CBG: Recent Labs  Lab 01/21/21 2137 01/22/21 0105  GLUCAP 141* 157*   Iron Studies: No results for input(s): IRON, TIBC, TRANSFERRIN, FERRITIN in the last 72 hours. @lablastinr3 @ Studies/Results: CT ABDOMEN PELVIS WO CONTRAST  Result Date: 01/21/2021 CLINICAL DATA:  Flank pain and hematuria. EXAM: CT ABDOMEN AND PELVIS WITHOUT CONTRAST TECHNIQUE: Multidetector CT imaging of the abdomen and pelvis was performed following the standard protocol without IV contrast. COMPARISON:  December 30, 2020 FINDINGS: Lower chest: Mild bronchial wall thickening. Scattered subsegmental atelectasis. Hepatobiliary: No suspicious hepatic lesion on this noncontrast examination. Gallbladder surgically absent. No biliary ductal dilation. Pancreas: Diffuse pancreatic atrophy with multifocal calcifications consistent with sequela of chronic pancreatitis. Slight interval decrease in size of the well-circumscribed low-attenuation collection in the hepatic tail which now measures 2.6 cm on image 30/3 previously 2.9 cm common almost certainly reflecting a pseudocyst. Spleen: Similar mild splenomegaly with the spleen measuring 15.2 cm in maximum craniocaudal dimension Adrenals/Urinary Tract: Right adrenal gland appears normal. Left adrenal gland is not visualized and may be surgically absent. Postsurgical change of prior left nephrectomy with similar residual soft tissue thickening in the nephrectomy bed in  comparison to prior studies. 15 mm stone is again seen in the right renal pelvis with additional smaller nonobstructive renal stones. Similar chronic right perinephric stranding. Urinary bladder is decompressed limiting evaluation. Stomach/Bowel: No enteric contrast was administered. Stomach is unremarkable for degree of distension. No pathologic dilation of large or small bowel. No evidence of acute bowel inflammation. Vascular/Lymphatic:  Subcentimeter/pericentimeter retroperitoneal lymph nodes measuring up to 1 cm in short axis on image 25/3 appear unchanged. Scattered aortic atherosclerosis without abdominal aortic aneurysm. Reproductive: Prostate is unremarkable. Other: Similar trace abdominopelvic free fluid. No walled off fluid collection. No pneumoperitoneum. Similar mild body wall edema. Prior mesh repair of an umbilical hernia. Musculoskeletal: Multilevel degenerative changes spine. Prior ORIF of a left femoral neck fracture. No acute osseous abnormality. IMPRESSION: 1. Similar changes of chronic pancreatitis with a palpable small pseudocyst in the pancreatic tail. 2. Unchanged 15 mm stone in the right renal pelvis, with chronic urothelial thickening and perinephric/peripelvic stranding. Additional smaller nonobstructive right renal stones. 3. Postsurgical change of prior left nephrectomy with residual soft tissue thickening in the nephrectomy bed and prominent retroperitoneal lymph nodes, not significantly changed dating back to August 2021. 4. Similar trace abdominopelvic free fluid and subcutaneous edema. 5.  Aortic Atherosclerosis (ICD10-I70.0). Electronically Signed   By: Dahlia Bailiff M.D.   On: 01/21/2021 18:47   DG Chest Port 1 View  Result Date: 01/21/2021 CLINICAL DATA:  covid+, missed dialysis, SOB EXAM: PORTABLE CHEST 1 VIEW COMPARISON:  08/04/2020 chest radiograph. FINDINGS: Stable cardiomediastinal silhouette with mild cardiomegaly. No pneumothorax. No pleural effusion. No overt pulmonary edema. Minimal scattered curvilinear opacities at the left costophrenic angle. No consolidative airspace disease. IMPRESSION: Stable mild cardiomegaly without overt pulmonary edema. Minimal scattered curvilinear opacities at the left costophrenic angle, favor minimal scarring or atelectasis. Electronically Signed   By: Ilona Sorrel M.D.   On: 01/21/2021 18:25   Medications:   albuterol  10 mg Nebulization Once   bacitracin   Topical  BID   carbamazepine  900 mg Oral BID   Chlorhexidine Gluconate Cloth  6 each Topical Q0600   doxycycline  100 mg Oral Q12H   DULoxetine  20 mg Oral Daily   heparin  5,000 Units Subcutaneous Q8H   insulin aspart  0-6 Units Subcutaneous TID WC   insulin glargine-yfgn  10 Units Subcutaneous QHS   pantoprazole  40 mg Oral Daily   phenobarbital  259.2 mg Oral QHS   simvastatin  20 mg Oral QHS   sodium bicarbonate  650 mg Oral TID   sodium zirconium cyclosilicate  10 g Oral TID    Dialysis Orders: [place], Monday/Wednesday/Friday, 4 hours 15 minutes, 180 dialyzer, BFR 400, DFR 500, EDW 102 kg, 2K/3.0 calcium, right upper arm AV fistula, heparin 1500 unit bolus, Mircera 100 mcg every 2 weeks, Hectorol 3 mcg IV 3 times weekly.  Assessment/Plan: 1.  Hyperkalemia: Secondary to missed dialysis. He has earlier received some medical temporizing measures for potassium shifting as well as Lokelma. K+ 6.0. On the schedule for dialysis today.  2.  End-stage renal disease: Discussed the importance of adherence with dialysis particularly when he is feeling poorly.  Did not get HD overnight- on the schedule for dialysis today.  3.  Hypertension: Hold antihypertensive therapy and monitor with hemodialysis/ultrafiltration.  Counseled regarding interdialytic weight gain. 4.  Anemia of chronic disease: Hemoglobin and hematocrit currently at goal, reported some transient hematuria but without any significant hemoglobin drop.  We will continue to follow trend. 5.  COVID-19 infection:  Symptomatic management, no indications for corticosteroids or additional therapy based on respiratory parameters. 6.  Chronic kidney disease-metabolic bone disease: Resume renal diet and restart binders along with Hectorol  Anice Paganini, [name] 01/22/2021, 8:49 AM  [facility] Pager: [phone]

## 2021-01-23 DIAGNOSIS — F32A Depression, unspecified: Secondary | ICD-10-CM | POA: Diagnosis present

## 2021-01-23 DIAGNOSIS — E872 Acidosis, unspecified: Secondary | ICD-10-CM | POA: Diagnosis present

## 2021-01-23 DIAGNOSIS — F419 Anxiety disorder, unspecified: Secondary | ICD-10-CM | POA: Diagnosis present

## 2021-01-23 DIAGNOSIS — E1122 Type 2 diabetes mellitus with diabetic chronic kidney disease: Secondary | ICD-10-CM | POA: Diagnosis present

## 2021-01-23 DIAGNOSIS — L0231 Cutaneous abscess of buttock: Secondary | ICD-10-CM | POA: Diagnosis present

## 2021-01-23 DIAGNOSIS — I5030 Unspecified diastolic (congestive) heart failure: Secondary | ICD-10-CM | POA: Diagnosis present

## 2021-01-23 DIAGNOSIS — M109 Gout, unspecified: Secondary | ICD-10-CM | POA: Diagnosis present

## 2021-01-23 DIAGNOSIS — K7460 Unspecified cirrhosis of liver: Secondary | ICD-10-CM | POA: Diagnosis present

## 2021-01-23 DIAGNOSIS — I700 Atherosclerosis of aorta: Secondary | ICD-10-CM | POA: Diagnosis present

## 2021-01-23 DIAGNOSIS — N186 End stage renal disease: Secondary | ICD-10-CM | POA: Diagnosis present

## 2021-01-23 DIAGNOSIS — U071 COVID-19: Secondary | ICD-10-CM | POA: Diagnosis present

## 2021-01-23 DIAGNOSIS — I132 Hypertensive heart and chronic kidney disease with heart failure and with stage 5 chronic kidney disease, or end stage renal disease: Secondary | ICD-10-CM | POA: Diagnosis present

## 2021-01-23 DIAGNOSIS — K861 Other chronic pancreatitis: Secondary | ICD-10-CM | POA: Diagnosis present

## 2021-01-23 DIAGNOSIS — N200 Calculus of kidney: Secondary | ICD-10-CM | POA: Diagnosis present

## 2021-01-23 DIAGNOSIS — Z992 Dependence on renal dialysis: Secondary | ICD-10-CM | POA: Diagnosis not present

## 2021-01-23 DIAGNOSIS — Z8616 Personal history of COVID-19: Secondary | ICD-10-CM | POA: Diagnosis not present

## 2021-01-23 DIAGNOSIS — D6959 Other secondary thrombocytopenia: Secondary | ICD-10-CM | POA: Diagnosis present

## 2021-01-23 DIAGNOSIS — N400 Enlarged prostate without lower urinary tract symptoms: Secondary | ICD-10-CM | POA: Diagnosis present

## 2021-01-23 DIAGNOSIS — E875 Hyperkalemia: Secondary | ICD-10-CM | POA: Diagnosis present

## 2021-01-23 DIAGNOSIS — J449 Chronic obstructive pulmonary disease, unspecified: Secondary | ICD-10-CM | POA: Diagnosis present

## 2021-01-23 DIAGNOSIS — X58XXXA Exposure to other specified factors, initial encounter: Secondary | ICD-10-CM | POA: Diagnosis present

## 2021-01-23 DIAGNOSIS — D638 Anemia in other chronic diseases classified elsewhere: Secondary | ICD-10-CM | POA: Diagnosis present

## 2021-01-23 DIAGNOSIS — E1165 Type 2 diabetes mellitus with hyperglycemia: Secondary | ICD-10-CM | POA: Diagnosis present

## 2021-01-23 DIAGNOSIS — G40909 Epilepsy, unspecified, not intractable, without status epilepticus: Secondary | ICD-10-CM | POA: Diagnosis present

## 2021-01-23 LAB — CBC
HCT: 27 % — ABNORMAL LOW (ref 39.0–52.0)
Hemoglobin: 8.4 g/dL — ABNORMAL LOW (ref 13.0–17.0)
MCH: 32.3 pg (ref 26.0–34.0)
MCHC: 31.1 g/dL (ref 30.0–36.0)
MCV: 103.8 fL — ABNORMAL HIGH (ref 80.0–100.0)
Platelets: 82 10*3/uL — ABNORMAL LOW (ref 150–400)
RBC: 2.6 MIL/uL — ABNORMAL LOW (ref 4.22–5.81)
RDW: 14.1 % (ref 11.5–15.5)
WBC: 4.5 10*3/uL (ref 4.0–10.5)
nRBC: 0 % (ref 0.0–0.2)

## 2021-01-23 LAB — GLUCOSE, CAPILLARY
Glucose-Capillary: 246 mg/dL — ABNORMAL HIGH (ref 70–99)
Glucose-Capillary: 184 mg/dL — ABNORMAL HIGH (ref 70–99)
Glucose-Capillary: 146 mg/dL — ABNORMAL HIGH (ref 70–99)
Glucose-Capillary: 220 mg/dL — ABNORMAL HIGH (ref 70–99)

## 2021-01-23 LAB — RENAL FUNCTION PANEL
Albumin: 2.5 g/dL — ABNORMAL LOW (ref 3.5–5.0)
Anion gap: 17 — ABNORMAL HIGH (ref 5–15)
BUN: 78 mg/dL — ABNORMAL HIGH (ref 6–20)
CO2: 11 mmol/L — ABNORMAL LOW (ref 22–32)
Calcium: 6.7 mg/dL — ABNORMAL LOW (ref 8.9–10.3)
Chloride: 107 mmol/L (ref 98–111)
Creatinine, Ser: 13.39 mg/dL — ABNORMAL HIGH (ref 0.61–1.24)
GFR, Estimated: 4 mL/min — ABNORMAL LOW (ref >60)
Glucose, Bld: 247 mg/dL — ABNORMAL HIGH (ref 70–99)
Phosphorus: >30 mg/dL — ABNORMAL HIGH (ref 2.5–4.6)
Potassium: 6.2 mmol/L — ABNORMAL HIGH (ref 3.5–5.1)
Sodium: 135 mmol/L (ref 135–145)

## 2021-01-23 LAB — POTASSIUM: Potassium: 5.5 mmol/L — ABNORMAL HIGH (ref 3.5–5.1)

## 2021-01-23 MED ORDER — PENTAFLUOROPROP-TETRAFLUOROETH EX AERO: 1.0000 "application " | INHALATION_SPRAY | CUTANEOUS | Status: DC | PRN

## 2021-01-23 MED ORDER — LIDOCAINE HCL (PF) 1 % IJ SOLN: 5.0000 mL | INTRAMUSCULAR | Status: DC | PRN

## 2021-01-23 MED ORDER — CHLORHEXIDINE GLUCONATE CLOTH 2 % EX PADS
6.0000 | MEDICATED_PAD | Freq: Every day | CUTANEOUS | Status: DC
  Administered 2021-01-24: 6 via TOPICAL

## 2021-01-23 MED ORDER — SEVELAMER CARBONATE 800 MG PO TABS
800.0000 mg | ORAL_TABLET | Freq: Three times a day (TID) | ORAL | Status: DC
  Administered 2021-01-23 – 2021-01-24 (×3): 800 mg via ORAL
  Filled 2021-01-23 (×4): qty 1

## 2021-01-23 MED ORDER — SODIUM CHLORIDE 0.9 % IV SOLN: 100.0000 mL | INTRAVENOUS | Status: DC | PRN

## 2021-01-23 MED ORDER — LIDOCAINE-PRILOCAINE 2.5-2.5 % EX CREA: 1.0000 "application " | TOPICAL_CREAM | CUTANEOUS | Status: DC | PRN

## 2021-01-23 MED ORDER — HEPARIN SODIUM (PORCINE) 1000 UNIT/ML DIALYSIS: 1000.0000 [IU] | INTRAMUSCULAR | Status: DC | PRN

## 2021-01-23 MED ORDER — SODIUM ZIRCONIUM CYCLOSILICATE 10 G PO PACK
10.0000 g | PACK | Freq: Every day | ORAL | Status: AC
  Administered 2021-01-23: 10 g via ORAL
  Filled 2021-01-23: qty 1

## 2021-01-23 MED ORDER — HYDROMORPHONE HCL 1 MG/ML IJ SOLN
0.2500 mg | Freq: Once | INTRAMUSCULAR | Status: AC
  Administered 2021-01-23: 0.25 mg via INTRAVENOUS
  Filled 2021-01-23: qty 0.5

## 2021-01-23 MED ORDER — INSULIN GLARGINE-YFGN 100 UNIT/ML ~~LOC~~ SOLN
15.0000 [IU] | Freq: Every day | SUBCUTANEOUS | Status: DC
  Filled 2021-01-23 (×2): qty 0.15

## 2021-01-23 NOTE — Progress Notes (Addendum)
[facility] Progress Note   Subjective:   Pt seen in room, on HD. Reports feeling better after getting pain meds for his foot. Denies SOB, CP, palpitations, dizziness, abdominal pain and nausea.   Objective Vitals:   01/23/21 0700 01/23/21 0709 01/23/21 0730 01/23/21 0800  BP: (!) 148/84 140/81 (!) 138/1 138/75  Pulse: 82  70   Resp: 18     Temp: 98.4 F (36.9 C)     TempSrc: Oral     SpO2: 100%     Weight: 113 kg      Physical Exam General: WDWN male, alert and in NAD Heart: RRR, no murmurs, rubs or gallops Lungs: CTA bilaterally without wheezing, rhonchi or rales Abdomen: Soft, non-distended, +BS Extremities: No edema b/l lower extremities Dialysis Access: RUE AVF + bruit  Additional Objective Labs: Basic Metabolic Panel: Recent Labs  Lab 01/21/21 1545 01/21/21 2319 01/22/21 0708  NA 135 138 139  K 6.4* 6.1* 6.0*  CL 109 110 111  CO2 11* 11* 11*  GLUCOSE 369* 128* 90  BUN 65* 65* 70*  CREATININE 12.31* 12.30* 12.53*  CALCIUM 6.7* 6.9* 6.7*  PHOS  --  >30.0*  --    Liver Function Tests: Recent Labs  Lab 01/21/21 1545 01/21/21 2319  AST 11*  --   ALT 10  --   ALKPHOS 106  --   BILITOT 0.6  --   PROT 6.2*  --   ALBUMIN 2.8* 2.7*   No results for input(s): LIPASE, AMYLASE in the last 168 hours. CBC: Recent Labs  Lab 01/21/21 1545  WBC 6.2  NEUTROABS 4.2  HGB 10.2*  HCT 32.8*  MCV 105.1*  PLT 85*   Blood Culture    Component Value Date/Time   SDES BLOOD LEFT FOREARM 08/04/2020 1305   SPECREQUEST  08/04/2020 1305    BOTTLES DRAWN AEROBIC ONLY Blood Culture results may not be optimal due to an inadequate volume of blood received in culture bottles   CULT  08/04/2020 1305    NO GROWTH 5 DAYS Performed at [facility], [place] [address] [place],  [address]    REPTSTATUS 08/09/2020 FINAL 08/04/2020 1305    Cardiac Enzymes: No results for input(s): CKTOTAL, CKMB, CKMBINDEX, TROPONINI in the last 168  hours. CBG: Recent Labs  Lab 01/22/21 0854 01/22/21 1203 01/22/21 1646 01/22/21 2038 01/23/21 0632  GLUCAP 72 111* 147* 142* 246*   Iron Studies: No results for input(s): IRON, TIBC, TRANSFERRIN, FERRITIN in the last 72 hours. @lablastinr3 @ Studies/Results: CT ABDOMEN PELVIS WO CONTRAST  Result Date: 01/21/2021 CLINICAL DATA:  Flank pain and hematuria. EXAM: CT ABDOMEN AND PELVIS WITHOUT CONTRAST TECHNIQUE: Multidetector CT imaging of the abdomen and pelvis was performed following the standard protocol without IV contrast. COMPARISON:  December 30, 2020 FINDINGS: Lower chest: Mild bronchial wall thickening. Scattered subsegmental atelectasis. Hepatobiliary: No suspicious hepatic lesion on this noncontrast examination. Gallbladder surgically absent. No biliary ductal dilation. Pancreas: Diffuse pancreatic atrophy with multifocal calcifications consistent with sequela of chronic pancreatitis. Slight interval decrease in size of the well-circumscribed low-attenuation collection in the hepatic tail which now measures 2.6 cm on image 30/3 previously 2.9 cm common almost certainly reflecting a pseudocyst. Spleen: Similar mild splenomegaly with the spleen measuring 15.2 cm in maximum craniocaudal dimension Adrenals/Urinary Tract: Right adrenal gland appears normal. Left adrenal gland is not visualized and may be surgically absent. Postsurgical change of prior left nephrectomy with similar residual soft tissue thickening in the nephrectomy bed in comparison to  prior studies. 15 mm stone is again seen in the right renal pelvis with additional smaller nonobstructive renal stones. Similar chronic right perinephric stranding. Urinary bladder is decompressed limiting evaluation. Stomach/Bowel: No enteric contrast was administered. Stomach is unremarkable for degree of distension. No pathologic dilation of large or small bowel. No evidence of acute bowel inflammation. Vascular/Lymphatic:  Subcentimeter/pericentimeter retroperitoneal lymph nodes measuring up to 1 cm in short axis on image 25/3 appear unchanged. Scattered aortic atherosclerosis without abdominal aortic aneurysm. Reproductive: Prostate is unremarkable. Other: Similar trace abdominopelvic free fluid. No walled off fluid collection. No pneumoperitoneum. Similar mild body wall edema. Prior mesh repair of an umbilical hernia. Musculoskeletal: Multilevel degenerative changes spine. Prior ORIF of a left femoral neck fracture. No acute osseous abnormality. IMPRESSION: 1. Similar changes of chronic pancreatitis with a palpable small pseudocyst in the pancreatic tail. 2. Unchanged 15 mm stone in the right renal pelvis, with chronic urothelial thickening and perinephric/peripelvic stranding. Additional smaller nonobstructive right renal stones. 3. Postsurgical change of prior left nephrectomy with residual soft tissue thickening in the nephrectomy bed and prominent retroperitoneal lymph nodes, not significantly changed dating back to August 2021. 4. Similar trace abdominopelvic free fluid and subcutaneous edema. 5.  Aortic Atherosclerosis (ICD10-I70.0). Electronically Signed   By: Dahlia Bailiff M.D.   On: 01/21/2021 18:47   DG Chest Port 1 View  Result Date: 01/21/2021 CLINICAL DATA:  covid+, missed dialysis, SOB EXAM: PORTABLE CHEST 1 VIEW COMPARISON:  08/04/2020 chest radiograph. FINDINGS: Stable cardiomediastinal silhouette with mild cardiomegaly. No pneumothorax. No pleural effusion. No overt pulmonary edema. Minimal scattered curvilinear opacities at the left costophrenic angle. No consolidative airspace disease. IMPRESSION: Stable mild cardiomegaly without overt pulmonary edema. Minimal scattered curvilinear opacities at the left costophrenic angle, favor minimal scarring or atelectasis. Electronically Signed   By: Ilona Sorrel M.D.   On: 01/21/2021 18:25   Medications:   albuterol  10 mg Nebulization Once   bacitracin   Topical  BID   calcium carbonate  4 tablet Oral TID WC   carbamazepine  900 mg Oral BID   Chlorhexidine Gluconate Cloth  6 each Topical Q0600   [START ON 01/25/2021] doxercalciferol  3 mcg Intravenous Q M,W,F-HD   doxycycline  100 mg Oral Q12H   DULoxetine  20 mg Oral Daily   heparin  5,000 Units Subcutaneous Q8H   insulin aspart  0-6 Units Subcutaneous TID WC   insulin glargine-yfgn  10 Units Subcutaneous QHS   multivitamin  1 tablet Oral QHS   pantoprazole  40 mg Oral Daily   phenobarbital  259.2 mg Oral QHS   simvastatin  20 mg Oral QHS   sodium bicarbonate  650 mg Oral TID    Dialysis Orders: [place], Monday/Wednesday/Friday, 4 hours 15 minutes, 180 dialyzer, BFR 400, DFR 500, EDW 102 kg, 2K/3.0 calcium, right upper arm AV fistula, heparin 1500 unit bolus, Mircera 100 mcg every 2 weeks, Hectorol 3 mcg IV 3 times weekly.  Assessment/Plan: 1.  Hyperkalemia: Secondary to missed dialysis. He has earlier received some medical temporizing measures for potassium shifting as well as Lokelma. Got HD early today. Lokelma stopped.  2.  End-stage renal disease: Discussed the importance of adherence with dialysis particularly when he is feeling poorly. See also below 3.  Hypertension: 11kg over his EDW. Hold antihypertensive therapy and monitor with hemodialysis/ultrafiltration.  Counseled regarding interdialytic weight gain. Continue aggressive UF goals as tolerated. 4.  Anemia of chronic disease: Hemoglobin and hematocrit currently at goal, reported some transient hematuria but  without any significant hemoglobin drop.  We will continue to follow trend. 5.  COVID-19 infection: Denies any respiratory distress, no indications for corticosteroids or additional therapy based on respiratory parameters. 6.  Chronic kidney disease-metabolic bone disease: Resume renal diet and restart binders along with Hectorol  Addendum: Tolerated HD early am today with 3L UF. Unfortunately post HD potassium is still elevated at  6.2. Will order another dose of lokelma and HD tomorrow with a 1K bath for 3 hours.   Anice Paganini, [name] 01/23/2021, 8:38 AM  [facility] Pager: [phone]  Pt seen, examined and agree w assess/plan as above with additions as indicated.  [facility] 01/23/2021, 1:47 PM

## 2021-01-23 NOTE — Care Management Obs Status (Signed)
[place] NOTIFICATION   Patient Details  Name: Eddie Davies MRN: [id number] Date of Birth: 03-16-1970   Medicare Observation Status Notification Given:  Yes    Bartholomew Crews, RN 01/23/2021, 8:51 AM

## 2021-01-23 NOTE — Plan of Care (Signed)
°  Problem: Clinical Measurements: Goal: Will remain free from infection Outcome: Not Progressing   Problem: Elimination: Goal: Will not experience complications related to bowel motility Outcome: Not Progressing   Problem: Elimination: Goal: Will not experience complications related to urinary retention Outcome: Not Progressing   Problem: Skin Integrity: Goal: Risk for impaired skin integrity will decrease Outcome: Not Progressing   Problem: Safety: Goal: Ability to remain free from injury will improve Outcome: Not Progressing

## 2021-01-23 NOTE — TOC Progression Note (Signed)
Transition of Care [facility]) - Progression Note    Patient Details  Name: Eddie Davies MRN: [id number] Date of Birth: 09/13/1970  [facility]) CM/SW Contact  Joanne Chars, LCSW Phone Number: 01/23/2021, 12:36 PM  Clinical Narrative:   CSW confirmed with Carollee Leitz at [facility] that they can receive pt today.    1230: Per MD, pt not medically stable, will not DC today.    Expected Discharge Plan: Long Term Nursing Home Barriers to Discharge: SNF Pending discharge orders, Continued Medical Work up  Expected Discharge Plan and Services Expected Discharge Plan: [place]       Living arrangements for the past 2 months: [place]                                       Social Determinants of Health (SDOH) Interventions    Readmission Risk Interventions Readmission Risk Prevention Plan 03/19/2019 11/29/2018 05/28/2018  Transportation Screening Complete Complete -  Medication Review [employer]) Referral to Pharmacy Complete -  PCP or Specialist appointment within 3-5 days of discharge Not Complete Not Complete -  PCP/Specialist Appt Not Complete comments LTC SNF resident pt is SNF resident -  [place] or [place] Not Complete Not Complete Complete  HRI or Home Care Consult Pt Refusal Comments LTC SNF resident pt is SNF resident -  SW Recovery Care/Counseling Consult Complete Complete Complete  Palliative Care Screening Not Applicable Not Applicable -  [place] Complete Complete -  Some recent data might be hidden

## 2021-01-23 NOTE — Progress Notes (Signed)
HD#0 Subjective:  Overnight Events: Patient got pain meds overnight for neuropathy.  Patient is resting in bed comfortably while getting dialysis. He says that his nausea and vomiting have improved. Main complaint today is that his feet hurt.   Objective:  Vital signs in last 24 hours: Vitals:   01/23/21 0830 01/23/21 0900 01/23/21 0930 01/23/21 1000  BP: 130/79 138/80 140/83 138/82  Pulse: 76 70 70 72  Resp:    18  Temp:      TempSrc:      SpO2:    100%  Weight:    109 kg   Supplemental O2: Room Air SpO2: 100 %   Physical Exam:  Cardiovascular: regular rate and rhythm, no m/r/g Pulmonary/Chest: normal work of breathing on room air, lungs clear to auscultation bilaterally Abdominal: soft, non-tender, non-distended MSK: normal bulk and tone Neurological: alert & oriented x 3, answering questions appropriately Skin: hyperkeratotic adherent dry scaling thickest along the bilateral lower extremities but present diffusely Psych: normal affect  Filed Weights   01/22/21 0401 01/23/21 0700 01/23/21 1000  Weight: 112.6 kg 113 kg 109 kg     Intake/Output Summary (Last 24 hours) at 01/23/2021 1226 Last data filed at 01/23/2021 1200 Gross per 24 hour  Intake 477 ml  Output 3100 ml  Net -2623 ml   Net IO Since Admission: -2,123 mL [01/23/21 1227]  Pertinent Labs: CBC Latest Ref Rng & Units 01/23/2021 01/21/2021 08/05/2020  WBC 4.0 - 10.5 K/uL 4.5 6.2 4.7  Hemoglobin 13.0 - 17.0 g/dL 8.4(L) 10.2(L) 9.0(L)  Hematocrit 39.0 - 52.0 % 27.0(L) 32.8(L) 26.4(L)  Platelets 150 - 400 K/uL 82(L) 85(L) 66(L)    CMP Latest Ref Rng & Units 01/23/2021 01/22/2021 01/21/2021  Glucose 70 - 99 mg/dL 247(H) 90 128(H)  BUN 6 - 20 mg/dL 78(H) 70(H) 65(H)  Creatinine 0.61 - 1.24 mg/dL 13.39(H) 12.53(H) 12.30(H)  Sodium 135 - 145 mmol/L 135 139 138  Potassium 3.5 - 5.1 mmol/L 6.2(H) 6.0(H) 6.1(H)  Chloride 98 - 111 mmol/L 107 111 110  CO2 22 - 32 mmol/L 11(L) 11(L) 11(L)  Calcium 8.9 -  10.3 mg/dL 6.7(L) 6.7(L) 6.9(L)  Total Protein 6.5 - 8.1 g/dL - - -  Total Bilirubin 0.3 - 1.2 mg/dL - - -  Alkaline Phos 38 - 126 U/L - - -  AST 15 - 41 U/L - - -  ALT 0 - 44 U/L - - -    Imaging: No results found.  Assessment/Plan:   Principal Problem:   Hyperkalemia Active Problems:   Metabolic acidosis   ESRD (end stage renal disease) on dialysis ([place])   Cutaneous abscess of buttock   Hyperglycemia   Patient Summary: Eddie Davies is a 50 y.o.with pertinent PMH of ESRD on HD MWF, T2DM, NASH cirrhosis, gout, hx of seizures, who presents to [facility] from [facility] after missing 2 sessions of HD due to diarrhea, nausea, and vomiting 2/2 to Covid and admit for hyperkalemia 2/2 to missing HD.   #Hyperkalemia #ESRD on HD (MWF) s/p left nephrectomy #Anion Gap Metabolic Acidosis Patient received dialysis this morning, expected to discharge today however this did not happen because his potassium came back still at 6.2 today despite dialysis. Nephrology planning on repeating dialysis tomorrow to get his K down before discharge.  - nephology following, appreciate assistance - repeat evening BMP -Sodium bicarb 650 mg TID, increased per nephrology  - renvela 800 TID - lokelma dose in PM   #COVID19 Patient  has some minor congestion but is otherwise doing well. He states that his symptoms are improving.  His symptoms started 12/24. Not requiring any oxygen. Will hold off on steroids remdesivir at this time.  - CTM   #History of Epilepsy Patient reports that he had a seizure about 2 days ago.  [facility] states that this did not occur. Will continue to monitor and if patient has any witnessed seizures can discuss adjusting his meds with neurology. -Continue carbamazepine  -Phenobarbital 259.2 mg qhs   #Anemia of chronic disease H/H  stable  - transfuse <7 - daily CBC   #T2DM Patient takes Glargine 25 units qhs, Tradjenta 5 mg qd and SSI at Nursing home.   - A1c 8.6. -SSI -Increase Glargine to 15 units qhs -Hold Tradjenta   #Laceration to inferior meatus of penis -Bacitracin ointment BID   #Abscess to lower back Reports that abscess has been present for about 2 months. He is afebrile with WBC wnl. Abscess lanced in ED.  -Doxycycline 100 mg Q12 1/5 days   #Nonobstructing nephrolithaisis #BPH #Gross hematuria CT abd/pelvis showed 15 mm stone in right renal pelvis with additional smaller nonobstructive right renal stones. Gross hematuria likely secondary to renal stones.  Hx of obstructing right ureteral stone with stent placed and removed in 2021. Home medication include tamsulosin. He does not currently have pain. UA negative for nitrites and leukocytes, +Hgb. -Outpatient urology follow-up, on chart review last appt with urology was 02/21 for removal of stent.   #Chronic Pancreatitis CT abd/pelvis with chronic pancreatitis with palpable small pseudocyst in the pancreatic tail.   #NASH Cirrhosis #Thrombocytopenia Platelet count of 85. -Continue home medication of lactulose   #COPD #Tobacco use disorder On chart review no prior PFTs. -Continue home medication of Combivent 1 puff q 6 hrs    #HFpEF #HTN Last EF 55-65% 02/22 on pharmacologic stress test.    #HLD #Aortic Atherosclerosis -Simvastatin 20 mg   #MDD -Continue duloxetine 20 mg   #Gout Home medication includes Allopurinol 100 mg qd.   #GERD -Continue home medication of Omeprazole 20 mg   #Ichthyosis vulgaris   Diet: Renal IVF: None,None VTE: Heparin Code: Full  Scarlett Presto, MD Internal Medicine Resident PGY-1 Pager [phone] Please contact the on call pager after 5 pm and on weekends at [phone].

## 2021-01-24 DIAGNOSIS — U071 COVID-19: Secondary | ICD-10-CM | POA: Diagnosis not present

## 2021-01-24 DIAGNOSIS — E875 Hyperkalemia: Secondary | ICD-10-CM | POA: Diagnosis not present

## 2021-01-24 LAB — RENAL FUNCTION PANEL
Albumin: 2.5 g/dL — ABNORMAL LOW (ref 3.5–5.0)
Anion gap: 13 (ref 5–15)
BUN: 56 mg/dL — ABNORMAL HIGH (ref 6–20)
CO2: 19 mmol/L — ABNORMAL LOW (ref 22–32)
Calcium: 7.2 mg/dL — ABNORMAL LOW (ref 8.9–10.3)
Chloride: 103 mmol/L (ref 98–111)
Creatinine, Ser: 9.75 mg/dL — ABNORMAL HIGH (ref 0.61–1.24)
GFR, Estimated: 6 mL/min — ABNORMAL LOW (ref >60)
Glucose, Bld: 336 mg/dL — ABNORMAL HIGH (ref 70–99)
Phosphorus: 9.2 mg/dL — ABNORMAL HIGH (ref 2.5–4.6)
Potassium: 4.7 mmol/L (ref 3.5–5.1)
Sodium: 135 mmol/L (ref 135–145)

## 2021-01-24 LAB — CBC
HCT: 25.7 % — ABNORMAL LOW (ref 39.0–52.0)
Hemoglobin: 8.6 g/dL — ABNORMAL LOW (ref 13.0–17.0)
MCH: 33.5 pg (ref 26.0–34.0)
MCHC: 33.5 g/dL (ref 30.0–36.0)
MCV: 100 fL (ref 80.0–100.0)
Platelets: 82 10*3/uL — ABNORMAL LOW (ref 150–400)
RBC: 2.57 MIL/uL — ABNORMAL LOW (ref 4.22–5.81)
RDW: 13.8 % (ref 11.5–15.5)
WBC: 3.6 10*3/uL — ABNORMAL LOW (ref 4.0–10.5)
nRBC: 0 % (ref 0.0–0.2)

## 2021-01-24 LAB — GLUCOSE, CAPILLARY
Glucose-Capillary: 242 mg/dL — ABNORMAL HIGH (ref 70–99)
Glucose-Capillary: 345 mg/dL — ABNORMAL HIGH (ref 70–99)

## 2021-01-24 LAB — HEPATITIS B SURFACE ANTIBODY,QUALITATIVE: Hep B S Ab: REACTIVE — AB

## 2021-01-24 LAB — HEPATITIS B SURFACE ANTIGEN: Hepatitis B Surface Ag: NONREACTIVE

## 2021-01-24 MED ORDER — GABAPENTIN 100 MG PO CAPS
100.0000 mg | ORAL_CAPSULE | Freq: Three times a day (TID) | ORAL | Status: DC | PRN
  Administered 2021-01-24: 100 mg via ORAL
  Filled 2021-01-24: qty 1

## 2021-01-24 MED ORDER — GABAPENTIN 100 MG PO CAPS: 100.0000 mg | ORAL_CAPSULE | Freq: Three times a day (TID) | ORAL | 0 refills | Status: DC | PRN

## 2021-01-24 MED ORDER — HYDROMORPHONE HCL 1 MG/ML IJ SOLN
0.2500 mg | Freq: Once | INTRAMUSCULAR | Status: AC
  Administered 2021-01-24: 0.25 mg via INTRAVENOUS
  Filled 2021-01-24: qty 0.5

## 2021-01-24 MED ORDER — DOXYCYCLINE HYCLATE 100 MG PO TABS: 100.0000 mg | ORAL_TABLET | Freq: Two times a day (BID) | ORAL | 0 refills | Status: AC

## 2021-01-24 MED ORDER — DOXYCYCLINE HYCLATE 100 MG PO TABS: 100.0000 mg | ORAL_TABLET | Freq: Two times a day (BID) | ORAL | 0 refills | Status: DC

## 2021-01-24 NOTE — TOC Transition Note (Signed)
Transition of Care [facility]) - CM/SW Discharge Note   Patient Details  Name: Eddie Davies MRN: [id number] Date of Birth: 03-21-1970  Transition of Care [facility] CM/SW Contact:  Joanne Chars, LCSW Phone Number: 01/24/2021, 12:26 PM   Clinical Narrative:   Pt discharging to [facility]. RN call report to [phone].    Final next level of care: Long Term Nursing Home Barriers to Discharge: Barriers Resolved   Patient Goals and CMS Choice Patient states their goals for this hospitalization and ongoing recovery are:: return to LTC CMS Medicare.gov Compare Post Acute Care list provided to:: Patient Choice offered to / list presented to : Patient  Discharge Placement              Patient chooses bed at:  [facility]) Patient to be transferred to facility by: [place] Name of family member notified: cousin [name] Patient and family notified of of transfer: 01/24/21  Discharge Plan and Services                                     Social Determinants of Health ([place]) Interventions     Readmission Risk Interventions Readmission Risk Prevention Plan 03/19/2019 11/29/2018 05/28/2018  Transportation Screening Complete Complete -  Medication Review [employer]) Referral to Pharmacy Complete -  PCP or Specialist appointment within 3-5 days of discharge Not Complete Not Complete -  PCP/Specialist Appt Not Complete comments LTC SNF resident pt is SNF resident -  [place] or [place] Not Complete Not Complete Complete  HRI or Home Care Consult Pt Refusal Comments LTC SNF resident pt is SNF resident -  SW Recovery Care/Counseling Consult Complete Complete Complete  Palliative Care Screening Not Applicable Not Applicable -  Skilled Nursing Facility Complete Complete -  Some recent data might be hidden

## 2021-01-24 NOTE — Progress Notes (Signed)
This rn provided report to PTAR at bedside and also to staff person [place] with [facility] at [phone]. [place] and PTAR aware aware pt covid positive results, pt last cbg 345 and pt refusal of insulin. Informed PTAR and [place] md has been informed of pt cbg and pt efusal of insulin.

## 2021-01-24 NOTE — Progress Notes (Signed)
Mobility Specialist Progress Note:   01/24/21 1130  Mobility  Activity Ambulated to bathroom  Level of Assistance Standby assist, set-up cues, supervision of patient - no hands on  Assistive Device Front wheel walker  Distance Ambulated (ft) 30 ft  Mobility Sit up in bed/chair position for meals;Out of bed for toileting  Mobility Response Tolerated well  Mobility performed by Mobility specialist  Bed Position Chair  $Mobility charge 1 Mobility   Pt requesting to go to BR, BM successful. No physical assist required. Pt left up in chair with all needs met.   Nelta Numbers Mobility Specialist  Phone [phone]

## 2021-01-24 NOTE — Plan of Care (Signed)
°  Problem: Activity: Goal: Risk for activity intolerance will decrease Outcome: Not Progressing   Problem: Nutrition: Goal: Adequate nutrition will be maintained Outcome: Not Progressing   Problem: Safety: Goal: Ability to remain free from injury will improve Outcome: Not Progressing   Problem: Skin Integrity: Goal: Risk for impaired skin integrity will decrease Outcome: Not Progressing

## 2021-01-24 NOTE — Progress Notes (Signed)
[facility] Progress Note   Subjective:   K+ 4.7 this AM. Pt denies any SOB, CP, palpitations, dizziness, abdominal pain and nausea. Wants to go home today.   Objective Vitals:   01/23/21 1000 01/23/21 1500 01/23/21 2026 01/24/21 0500  BP: 138/82 (!) 146/79 (!) 144/78 (!) 147/83  Pulse: 72 66 66 66  Resp: 18 18 17 18   Temp:  98.2 F (36.8 C) 98.1 F (36.7 C) 98.1 F (36.7 C)  TempSrc:  Oral Oral Oral  SpO2: 100% 100% 100% 100%  Weight: 109 kg      Physical Exam General: WDWN male in NAD Heart: RRR, no murmurs, rubs or gallops Lungs: CTA bilaterally without wheezing, rhonchi or rales Abdomen: Soft, non-distended, +BS Extremities: No edema b/l lower extremities Dialysis Access: RUE AVF + bruit  Additional Objective Labs: Basic Metabolic Panel: Recent Labs  Lab 01/21/21 2319 01/22/21 0708 01/23/21 1048 01/23/21 1744 01/24/21 0135  NA 138 139 135  --  135  K 6.1* 6.0* 6.2* 5.5* 4.7  CL 110 111 107  --  103  CO2 11* 11* 11*  --  19*  GLUCOSE 128* 90 247*  --  336*  BUN 65* 70* 78*  --  56*  CREATININE 12.30* 12.53* 13.39*  --  9.75*  CALCIUM 6.9* 6.7* 6.7*  --  7.2*  PHOS >30.0*  --  >30.0*  --  9.2*   Liver Function Tests: Recent Labs  Lab 01/21/21 1545 01/21/21 2319 01/23/21 1048 01/24/21 0135  AST 11*  --   --   --   ALT 10  --   --   --   ALKPHOS 106  --   --   --   BILITOT 0.6  --   --   --   PROT 6.2*  --   --   --   ALBUMIN 2.8* 2.7* 2.5* 2.5*   No results for input(s): LIPASE, AMYLASE in the last 168 hours. CBC: Recent Labs  Lab 01/21/21 1545 01/23/21 1048 01/24/21 0135  WBC 6.2 4.5 3.6*  NEUTROABS 4.2  --   --   HGB 10.2* 8.4* 8.6*  HCT 32.8* 27.0* 25.7*  MCV 105.1* 103.8* 100.0  PLT 85* 82* 82*   Blood Culture    Component Value Date/Time   SDES BLOOD LEFT FOREARM 08/04/2020 1305   SPECREQUEST  08/04/2020 1305    BOTTLES DRAWN AEROBIC ONLY Blood Culture results may not be optimal due to an inadequate volume of blood  received in culture bottles   CULT  08/04/2020 1305    NO GROWTH 5 DAYS Performed at [facility] [address], [place], [place] [address]    REPTSTATUS 08/09/2020 FINAL 08/04/2020 1305    Cardiac Enzymes: No results for input(s): CKTOTAL, CKMB, CKMBINDEX, TROPONINI in the last 168 hours. CBG: Recent Labs  Lab 01/22/21 2038 01/23/21 0632 01/23/21 1215 01/23/21 1838 01/23/21 2025  GLUCAP 142* 246* 184* 146* 220*   Iron Studies: No results for input(s): IRON, TIBC, TRANSFERRIN, FERRITIN in the last 72 hours. @lablastinr3 @ Studies/Results: No results found. Medications:  sodium chloride     sodium chloride      albuterol  10 mg Nebulization Once   bacitracin   Topical BID   calcium carbonate  4 tablet Oral TID WC   carbamazepine  900 mg Oral BID   Chlorhexidine Gluconate Cloth  6 each Topical Q0600   Chlorhexidine Gluconate Cloth  6 each Topical Q0600   [START ON 01/25/2021] doxercalciferol  3 mcg Intravenous Q M,W,F-HD   doxycycline  100 mg Oral Q12H   DULoxetine  20 mg Oral Daily   heparin  5,000 Units Subcutaneous Q8H   insulin aspart  0-6 Units Subcutaneous TID WC   insulin glargine-yfgn  15 Units Subcutaneous QHS   multivitamin  1 tablet Oral QHS   pantoprazole  40 mg Oral Daily   phenobarbital  259.2 mg Oral QHS   sevelamer carbonate  800 mg Oral TID WC   simvastatin  20 mg Oral QHS   sodium bicarbonate  650 mg Oral TID    Dialysis Orders: [place] [facility], Monday/Wednesday/Friday, 4 hours 15 minutes, 180 dialyzer, BFR 400, DFR 500, EDW 102 kg, 2K/3.0 calcium, right upper arm AV fistula, heparin 1500 unit bolus, Mircera 100 mcg every 2 weeks, Hectorol 3 mcg IV 3 times weekly.  Assessment/Plan: 1.  Hyperkalemia: Secondary to missed dialysis. He has earlier received some medical temporizing measures for potassium shifting as well as Lokelma. K+ was still elevated after dialysis yesterday and he was given another dose of lokelma with plans for additional HD this AM,  however K+ is now 4.7 so no emergent indications for RRT. Pt is agreeable to discharge today and return to his home HD unit tomorrow.  2.  End-stage renal disease: Discussed the importance of adherence with dialysis particularly when he is feeling poorly. Next HD will be tomorrow.  3.  Hypertension: 8kg over his EDW, chronic issue. Hold antihypertensive therapy and monitor with hemodialysis/ultrafiltration.  Counseled regarding interdialytic weight gain. Continue aggressive UF goals as tolerated. 4.  Anemia of chronic disease: Hgb 8.6.  reported some transient hematuria but without any significant hemoglobin drop, no further hematuria reported.  Will increase next mircera dose.  5.  COVID-19 infection: Denies any respiratory distress, no indications for corticosteroids or additional therapy based on respiratory parameters. 6.  Chronic kidney disease-metabolic bone disease: Resume renal diet and restart binders along with Hectorol. Phos is 9- reinforced importance of taking binders outpatient.   Anice Paganini, [name] 01/24/2021, 8:14 AM  [employer] Pager: [phone]

## 2021-01-24 NOTE — Progress Notes (Signed)
Pt cbg 345, pt refuse scheduled insulin. Pt informed of sxs and risk of hyperglycemia, pt acknowledge understanding. Pt irritated and agitated during interaction.

## 2021-01-24 NOTE — TOC Progression Note (Addendum)
Transition of Care [facility]) - Progression Note    Patient Details  Name: Eddie Davies MRN: [id number] Date of Birth: [birth year]  Transition of Care [facility]) CM/SW Contact  Joanne Chars, LCSW Phone Number: 01/24/2021, 10:44 AM  Clinical Narrative:   CSW unable to reach Carollee Leitz at [place] due to the holiday.  CSW was able to call the "care line" [phone], received call back from [place] at [place].  She has reached out to [facility], waiting on RN to call her back.  Does not think it will be a problem for pt to return today but has to confirm.    1200: [place] called back, they are ready to receive pt at [facility].    Expected Discharge Plan: Long Term Nursing Home Barriers to Discharge: SNF Pending discharge orders, Continued Medical Work up  Expected Discharge Plan and Services Expected Discharge Plan: [place]       Living arrangements for the past 2 months: [place] Expected Discharge Date: 01/24/21                                     Social Determinants of Health (SDOH) Interventions    Readmission Risk Interventions Readmission Risk Prevention Plan 03/19/2019 11/29/2018 05/28/2018  Transportation Screening Complete Complete -  Medication Review Press photographer) Referral to Pharmacy Complete -  PCP or Specialist appointment within 3-5 days of discharge Not Complete Not Complete -  PCP/Specialist Appt Not Complete comments LTC SNF resident pt is SNF resident -  [place] or [place] Not Complete Not Complete Complete  HRI or Home Care Consult Pt Refusal Comments LTC SNF resident pt is SNF resident -  SW Recovery Care/Counseling Consult Complete Complete Complete  Palliative Care Screening Not Applicable Not Applicable -  [place] Complete Complete -  Some recent data might be hidden

## 2021-01-24 NOTE — Progress Notes (Addendum)
Called and informed Dr Elliot Gurney pt refusing insulin sq, pt cbg 345. Informed md pt has been non compliant with meds and care from nursing staff during this admission. No new orders received from md

## 2021-01-25 LAB — HEPATITIS B SURFACE ANTIBODY, QUANTITATIVE: Hep B S AB Quant (Post): 44.9 m[IU]/mL (ref >9.9)

## 2021-03-02 ENCOUNTER — Institutional Professional Consult (permissible substitution): Payer: Medicare Other | Admitting: Neurology

## 2021-03-03 ENCOUNTER — Telehealth: Payer: Self-pay | Admitting: Neurology

## 2021-03-03 NOTE — Telephone Encounter (Signed)
I saw patient 03/2019. In my last note, I said "Patient's AEDmedications can be prescribed by his primary care, he can return to neurology clinic as needed.". If he has not had any seizures, he can be changed to an NP and then sent back to his primary care. Or, can we call the facility and let them know if he is stable we do not have to see him, we already transitioned him back to his pcp in the past? They cn just refill his meds?

## 2021-03-04 NOTE — Telephone Encounter (Signed)
Thank you Ivar Drape!

## 2021-04-13 ENCOUNTER — Institutional Professional Consult (permissible substitution): Payer: Medicare Other | Admitting: Neurology

## 2021-04-13 ENCOUNTER — Encounter: Payer: Self-pay | Admitting: Neurology

## 2021-04-13 ENCOUNTER — Ambulatory Visit (INDEPENDENT_AMBULATORY_CARE_PROVIDER_SITE_OTHER): Payer: Medicare Other | Admitting: Neurology

## 2021-04-13 VITALS — BP 141/77 | HR 82 | Wt 222.7 lb

## 2021-04-13 DIAGNOSIS — E538 Deficiency of other specified B group vitamins: Secondary | ICD-10-CM | POA: Diagnosis not present

## 2021-04-13 DIAGNOSIS — G40909 Epilepsy, unspecified, not intractable, without status epilepticus: Secondary | ICD-10-CM | POA: Diagnosis not present

## 2021-04-13 DIAGNOSIS — Z79899 Other long term (current) drug therapy: Secondary | ICD-10-CM

## 2021-04-13 NOTE — Patient Instructions (Signed)
Blood work ?We will send a team to hook you up for a 3-day eeg ? ?Epilepsy ?Epilepsy is a condition in which a person has repeated seizures over time. A seizure is a sudden burst of abnormal electrical and chemical activity in the brain. Seizures can cause a change in attention, behavior, or ability to remain awake and alert (altered mental status). ?Epilepsy increases a person's risk of falls, accidents, and injury. It can also lead to: ?Depression. ?Poor memory. ?Sudden unexplained death in epilepsy (SUDEP). This is rare, and its cause is not known. ?Most people with epilepsy lead normal lives. ?What are the causes? ?This condition may be caused by: ?A head injury or injury that happens at birth. ?A high fever during childhood. ?A stroke. ?Bleeding into or around the brain. ?Certain medicines and drugs. ?Having too little oxygen for a long period of time. ?Abnormal brain development. ?Certain conditions. These may include: ?Brain infection. ?Brain tumor. ?Conditions that are passed from parent to child (are hereditary). ?Many times, the cause of this condition is not known. ?What are the signs or symptoms? ?Symptoms of a seizure vary greatly from person to person. They may include: ?Uncontrollable shaking (convulsions) with fast, jerking movements of the arms or legs. ?Stiffening of the body. ?Breathing problems. ?Confusion, staring, or unresponsiveness. ?Head nodding, eye blinking or fluttering, or rapid eye movements. ?Drooling, grunting, or making clicking sounds with your mouth. ?Loss of bladder control and bowel control. ?Some people have symptoms right before a seizure happens (aura) and right after a seizure happens. Symptoms of an aura include: ?Fear or anxiety. ?Nausea. ?Vertigo. This is a feeling like: ?You are moving when you are not. ?Your surroundings are moving when they are not. ?D?j? vu. This is a feeling of having seen or heard something before. ?Odd tastes or smells. ?Changes in vision, such as  seeing flashing lights or spots. ?Symptoms that follow a seizure include: ?Confusion. ?Sleepiness. ?Headache. ?Sore muscles. ?How is this diagnosed? ?This condition is diagnosed based on: ?Your symptoms. ?Your medical history. ?A physical exam. ?A neurological exam. This includes checking your strength, reflexes, coordination, and sensations. ?Tests. These may include: ?A painless test that records your brain waves (electroencephalogram, orEEG). ?MRI. ?CT scan. ?A test of your spinal fluid (lumbar puncture, or spinal tap). ?Blood tests to check for signs of infection or abnormal blood chemistry. ?How is this treated? ?Treatment can control seizures. Some types of epilepsy will need lifelong treatment, and some types go away in time. ?This condition may be treated with: ?Medicines to control seizures and prevent future seizures. ?A vagus nerve stimulator. This is a device that is implanted in the chest. The device sends electrical impulses to the vagus nerve and to the brain to prevent seizures. This treatment may be recommended if medicines do not help. ?Brain surgery. There are several kinds of surgeries that may be done to stop seizures from happening or to reduce how often seizures happen. ?Blood tests. You may need to have blood tests regularly to check that you are getting the right amount of medicine. ?The ketogenic diet. This diet involves foods that are low in carbohydrates and high in fat. ?When this condition has been diagnosed, it is important to begin treatment as soon as possible. For some people, epilepsy goes away in time. ?Follow these instructions at home: ?Medicines ?Take over-the-counter and prescription medicines only as told by your health care provider. ?Avoid any substances that may prevent your medicine from working properly, such as alcohol. ?Activity ?Get  enough rest. Lack of sleep can make seizures more likely to happen. ?Follow instructions from your health care provider about driving,  swimming, and doing any other activities that would be dangerous if you had a seizure. If you live in the [place] check with your local department of motor vehicles [facility]) to find out about local driving laws. Each state has specific rules about when you can legally start driving again. ?Educating others ? ?Teach friends and family what to do if you have a seizure. They should: ?Help you get down to the ground to prevent a fall. ?Cushion your head and body. ?Loosen any tight clothing around your neck. ?Turn you on your side. If vomiting occurs, this helps keep your airway clear. ?Not hold you down. Holding you down will not stop the seizure. ?Not put anything in your mouth. ?Stay with you until you recover. ?Know whether or not you need emergency care. ?General instructions ?Avoid anything that has ever triggered a seizure for you. ?Keep a seizure diary. Record what you remember about each seizure, especially anything that might have triggered the seizure. ?Keep all follow-up visits. This is important. ?Where to find more information ?Epilepsy Foundation: epilepsy.com ?International League Against Epilepsy: ilae.org ?Contact a health care provider if: ?Your seizure pattern changes. ?You continue to have seizures with treatment. ?You have symptoms of an infection or illness. Either of these might increase your risk of having a seizure. ?You are unable to take your medicine. ?Get help right away if: ?You have: ?A seizure that does not stop after 5 minutes. ?Several seizures in a row without a complete recovery between seizures. ?A seizure that makes it harder to breathe. ?A seizure that leaves you unable to speak or use a part of your body. ?You did not wake up right away after a seizure. ?You injure yourself during a seizure. ?You have confusion or pain right after a seizure. ?These symptoms may represent a serious problem that is an emergency. Do not wait to see if the symptoms will go away. Get medical help right  away. Call your local emergency services (911 in the [place] Do not drive yourself to the hospital. ?If you ever feel like you may hurt yourself or others, or have thoughts about taking your own life, get help right away. Go to your nearest emergency department or: ?Call your local emergency services (911 in the [place] ?Call a suicide crisis helpline, such as the [place] at (671) 201-7287 or 988 in the [place]. This is open 24 hours a day in the [place] ?Text the Crisis Text Line at (607) 419-7972 (in the [place].). ?Summary ?Epilepsy is a condition in which a person has repeated seizures over time. Some types of epilepsy will need lifelong treatment, and some types go away in time. ?Seizures can cause many symptoms, such as brief staring and uncontrollable shaking or fast movements of the arms or legs. ?Treatment can control seizures. Take over-the-counter and prescription medicines only as told by your health care provider. ?Follow instructions from your health care provider about driving, swimming, and doing any other activities that would be dangerous if you had a seizure. ?Teach friends and family what to do if you have a seizure. ?This information is not intended to replace advice given to you by your health care provider. Make sure you discuss any questions you have with your health care provider. ?Document Revised: 08/05/2020 Document Reviewed: 07/15/2019 ?Elsevier Patient Education ? [place]. ? ?

## 2021-04-13 NOTE — Progress Notes (Signed)
?[name] ?Requesting Provider: [name], MD ?Primary Care Provider:  [name], [name] ? ?CC:  Hx of epilepsy ? ?Interval history 04/13/2021: 51 y.o. male here as requested by Eddie Davies for seizures since the are of 9 months. PMHx ESRD on HD, ESBL nephrectomy, type 2 diabetes, hypertension, heart failure, urea cycle metabolism disorder, noncompliance, obstructive sleep apnea, epilepsy, morbid severe obesity with hypoventilation, muscle wasting and atrophy, polyneuropathy, abnormalities of gait and mobility, hyperlipidemia, depression, anxiety, gout, tobacco use with recent admission for retroperitoneal abscess. Last time I saw him, he was stable an dhad been for many years ont he same medications so we did not change.  ? ? He says he is having repeated "seizures",  everything gets slow and he has a hot flash for 30 seconds. He remembers the whole things and have a conversation. He is having them 1-2 days a week and can have them 12 times in a day. He used to have "grand mal" seizures but he calls these "mini seizures".  He says he misses his medications and has jerking on the right side, he is here with a representative from his nursing facility. He says these are seizures, he used to have generalized tonic-clonic but he is having these events regularly. The semiology sounds unusual, we discussed getting levels and getting a 72 hour eeg. Last time I saw him, he was stable an dhad been for many years ont he same medications so we did not change. Blood work today, We will send a team to hook you up for a 3-day eeg. ? ?Patient complains of symptoms per HPI as well as the following symptoms: "mini seizures" . Pertinent negatives and positives per HPI. All others negative ? ?CT brain 2012:  ?DIAGNOSIS:  ?Right-sided jerking  ? ?TECHNIQUE:  ?Axial 5 mm scans from base to vertex without intravenous contrast.  ? ?COMPARISON: None  ? ?INTERPRETATION:   ?Moderate atrophy for age. Negative for mass or mass effect. Negative for  ?extra-axial fluid collection. Negative for evidence of hemorrhage.  ?Changes of ethmoid sinus disease are identified bilaterally.  ? ?CONCLUSION:  ?Moderate atrophy for age. Changes of ethmoid sinus disease.  ? ? ?VGL  ?___________________________________________________________  ?Read bySherryll Burger on May 20 2010 12:39P  ?[place]  ?Transcribed byPatrici Ranks on May 20 2010 12:39P  ?Signed by:  [name] on:  May 21 2010  1:08P ?Procedure Note ? ?[name], MD - 07/10/2012  ?Formatting of this note might be different from the original.  ?INDICATIONS:    OTH: RT SIDE JERKING  ?COMMENTS:     ED 10  ? ?PROCEDURE:   DCT 0007 - CT BRAIN/HEAD WO CONT  - May 20 2010  ? ? ?DIAGNOSIS:  ?Right-sided jerking  ? ?TECHNIQUE:  ?Axial 5 mm scans from base to vertex without intravenous contrast.  ? ?COMPARISON: None  ? ?INTERPRETATION:  ?Moderate atrophy for age. Negative for mass or mass effect. Negative for  ?extra-axial fluid collection. Negative for evidence of hemorrhage.  ?Changes of ethmoid sinus disease are identified bilaterally.  ? ?CONCLUSION:  ?Moderate atrophy for age. Changes of ethmoid sinus disease.  ? ? ?HPI 04/18/2019:  Eddie Davies is a 51 y.o. male here as requested by Eddie Limbo MD for seizures. PMHx ESRD on HD, ESBL nephrectomy, type 2 diabetes, hypertension, heart failure, urea cycle metabolism disorder, noncompliance, obstructive sleep apnea, epilepsy, morbid severe obesity  with hypoventilation, muscle wasting and atrophy, polyneuropathy, abnormalities of gait and mobility, hyperlipidemia, depression, anxiety, gout, tobacco use with recent admission for retroperitoneal abscess.  I reviewed Dr. Frutoso Schatz notes, there is no documentation on history of seizures in her documentation.  Patient had an EEG in March 2020 with showed a diffuse disturbance no epileptiform activity was  noted(reviewed report).  I do not see any other notes from neurology in epic or in care everywhere.  He is on carbamazepine and phenobarbital but I cannot find any  history of seizures. ? ?He has had seizures since the age of 30 when he was diagnosed with epilepsy after reported head injury, he states he have never lost consciousness, he says he remembers them all, he is a poor historian, he feels the medications work for him. He has been on the same medications for years, he is stable. He says his medications are given to him by staff. He says his seizures are everything goes in slow motion for 30 seconds at the most but no loss of consciousness or falls or generalized activity of clonic tonic movements. E does not have anymore information, again a poor historian.  ? ?Reviewed notes, labs and imaging from outside physicians, which showed: ? ?02/20/2019: ?Carbamazepine level 5.7 (8-12) ?Phenobarb level 9.5 (15-40) ? ?Reviewed CT Scan head report 03/14/2018: ? ? ?1. Mild image degradation due to patient motion. Within this technical constraint, no acute hemorrhage or evidence for acute large vessel territory ischemia. Although no evidence of acute infarction, mass, or hemorrhage is seen, CT is relatively insensitive for the detection of hypoxia/ischemia within the first 24-48 hours, and an MRI scan may be indicated. ? ?2. Suggestion of asymmetrically small left cerebral hemisphere that is likely long-standing given that there is associated expansion of the diploic space of the left calvarium, particularly the parietal and occipital calvarium. ? ?3. Asymmetric expansion of extra-axial CSF spaces along the left frontal and parietal convexity could be due to enlarged subarachnoid spaces or potentially subdural hygroma. This exerts no substantial mass effect. ? ?4. Generalized cerebral volume loss is advanced for patient's age. ? ?5. Near complete opacification of right mastoid air cells with trabecular thickening and  sclerosis suggesting at least a component of chronic inflammation. There are fluid levels in scattered left mastoid air cells without coalescence. While nonspecific, this should be correlated for signs/symptoms of otomastoiditis. ? ?CT Head 01/22/2018: ? ?.  Brain: No evidence of acute large vascular territory infarct.  No mass effect, mass lesion, or hydrocephalus. No acute hemorrhage. Subtle areas of linear cortical hyperdensity in the left temporal lobe are favored to the secondary to areas of cortical necrosis associated with parenchymal volume loss. ? ?CT of the head 05/21/2010: ? ?INTERPRETATION: ?Moderate atrophy for age. Negative for mass or mass effect. Negative for  ?extra-axial fluid collection. Negative for evidence of hemorrhage.  ?Changes of ethmoid sinus disease are identified bilaterally.  ? ?CONCLUSION: ?Moderate atrophy for age. Changes of ethmoid sinus disease. ? ?CT Head 09/2009: CONCLUSION: ?Asymmetric left supratentorial parenchymal atrophy versus a left convexity hygroma.  Given the patient's age and presentation, recommend MRI of the brain to further characterize.   ? ? ?Review of Systems: ?Patient complains of symptoms per HPI as well as the following symptoms: weakness, seizures,numbness. Pertinent negatives and positives per HPI. All others negative. ? ? ?Social History  ? ?Socioeconomic History  ? Marital status: Single  ?  Spouse name: Not on file  ? Number of children: 0  ?  Years of education: Not on file  ? Highest education level: High school graduate  ?Occupational History  ? Not on file  ?Tobacco Use  ? Smoking status: Every Day  ?  Packs/day: 1.00  ?  Types: Cigarettes  ? Smokeless tobacco: Never  ?Vaping Use  ? Vaping Use: Never used  ?Substance and Sexual Activity  ? Alcohol use: Not Currently  ? Drug use: Never  ? Sexual activity: Not on file  ?Other Topics Concern  ? Not on file  ?Social History Narrative  ? Lives at [facility]  ? Left handed  ? Caffeine: "none hardly"   ? ?Social Determinants of Health  ? ?Financial Resource Strain: Not on file  ?Food Insecurity: Not on file  ?Transportation Needs: Not on file  ?Physical Activity: Not on file  ?Stress: Not on file  ?Social Connections:

## 2021-04-18 ENCOUNTER — Encounter: Payer: Self-pay | Admitting: Neurology

## 2021-04-18 LAB — PHENOBARBITAL LEVEL: Phenobarbital, Serum: 12 ug/mL — ABNORMAL LOW (ref 15–40)

## 2021-04-18 LAB — METHYLMALONIC ACID, SERUM: Methylmalonic Acid: 1530 nmol/L — ABNORMAL HIGH (ref 0–378)

## 2021-04-18 LAB — B12 AND FOLATE PANEL
Folate: 7.3 ng/mL (ref >3.0)
Vitamin B-12: 391 pg/mL (ref 232–1245)

## 2021-04-18 LAB — CARBAMAZEPINE LEVEL, TOTAL: Carbamazepine (Tegretol), S: 6.3 ug/mL (ref 4.0–12.0)

## 2021-04-19 ENCOUNTER — Telehealth: Payer: Self-pay | Admitting: *Deleted

## 2021-04-19 NOTE — Telephone Encounter (Signed)
Per Dr [name], referral form for 72 hour ambulatory EEG (and routine EEG if needed) completed, signed, and faxed to [place] along with office note, insurance/demographic info, and EEG from 2020. Received a receipt of confirmation. ? ?

## 2021-04-22 ENCOUNTER — Telehealth: Payer: Self-pay | Admitting: *Deleted

## 2021-04-22 DIAGNOSIS — G40909 Epilepsy, unspecified, not intractable, without status epilepticus: Secondary | ICD-10-CM

## 2021-04-22 NOTE — Telephone Encounter (Signed)
-----   Message from Melvenia Beam, MD sent at 04/21/2021  8:35 AM EDT ----- ?His phenobarb levels are low. He said he was not getting his medication sometimes and I requested a medication administration record from his facility, can we call his facility and see if we can get that? His B12 is normal (MMA is elevated because of his kidney disease not because of low B12). Please fax these labs to his facility as well or when you call to get the medication administration record from the last month or so please talk to the NP there and inform her of the levels. At this time I will not change anything as I will wait for the 72 hour eeg results bc I suspect his events are not seizures but you never know and we will see. Thanks  ?

## 2021-04-22 NOTE — Telephone Encounter (Addendum)
Called pt's facility, [facility], and spoke with [place]. Discussed lab results. I requested a medication administration record and we discussed that the patient told Dr. [name] that sometimes he misses his medication.   She stated that insurance does not cover 4 tablets of phenobarbital at a time for the patient so the facility is having to pay out-of-pocket for it and they only receive a few days of supply at a time so that could be why one night is missed if its not in stock. She also states the medication administration record shows he did receive his medication every day in March.  He goes to dialysis 3 times a week and wonders if that has anything to do with why his level may be lower. She will fax a record over for the phenobarbital.  She states they were wondering if there was another strength of phenobarbital the patient could have so he did not have to take so many at one time and perhaps insurance will cover.  She states the patient's doctor was not comfortable making any dosage changes and that was one of the reasons why they sent him back over to our office.  She provided a fax number for me to send the labs to, [phone].  She also stated the patient's practitioner Tomi Likens was not currently there but we can call the on-call line at [phone]. They will be on the lookout for a call from [place] about scheduling his 72 hour ambulatory EEG to be done at [facility].  ? ?Update: I tried to fax the labs to [facility] but their fax machines won't accept the fax [phone] and [phone]). We received the medication administration record and it shows the Phenobarbital was given every night during the months of February and March.  ? ?Called [employer], provider with [facility], and LVM asking for call back.  ? ?Will discuss with Dr [name] alternative tablet strength for the pt.  ?

## 2021-04-23 NOTE — Telephone Encounter (Signed)
[employer] returned call. Please call back Monday. ?

## 2021-04-26 NOTE — Telephone Encounter (Signed)
Have spoken with [facility] and tried 3 different fax numbers that are not accepting the lab result fax [phone], [phone], and [phone]). Received one other fax number to try, [phone]. I tried to reach [employer] PA again and LVM for call back.  ?

## 2021-04-27 ENCOUNTER — Other Ambulatory Visit: Payer: Self-pay

## 2021-04-27 ENCOUNTER — Encounter (HOSPITAL_COMMUNITY): Payer: Self-pay

## 2021-04-27 ENCOUNTER — Emergency Department (HOSPITAL_COMMUNITY): Payer: Medicare Other

## 2021-04-27 ENCOUNTER — Inpatient Hospital Stay (HOSPITAL_COMMUNITY)
Admission: EM | Admit: 2021-04-27 | Discharge: 2021-04-29 | DRG: 682 | Disposition: A | Discharge status: Skilled Nursing Facility | Payer: Medicare Other | Attending: Critical Care Medicine | Admitting: Critical Care Medicine

## 2021-04-27 DIAGNOSIS — M109 Gout, unspecified: Secondary | ICD-10-CM | POA: Diagnosis present

## 2021-04-27 DIAGNOSIS — E559 Vitamin D deficiency, unspecified: Secondary | ICD-10-CM | POA: Diagnosis present

## 2021-04-27 DIAGNOSIS — Z9049 Acquired absence of other specified parts of digestive tract: Secondary | ICD-10-CM

## 2021-04-27 DIAGNOSIS — I959 Hypotension, unspecified: Secondary | ICD-10-CM | POA: Diagnosis present

## 2021-04-27 DIAGNOSIS — D696 Thrombocytopenia, unspecified: Secondary | ICD-10-CM | POA: Diagnosis not present

## 2021-04-27 DIAGNOSIS — G9341 Metabolic encephalopathy: Secondary | ICD-10-CM | POA: Diagnosis present

## 2021-04-27 DIAGNOSIS — E8721 Acute metabolic acidosis: Secondary | ICD-10-CM | POA: Diagnosis present

## 2021-04-27 DIAGNOSIS — K529 Noninfective gastroenteritis and colitis, unspecified: Secondary | ICD-10-CM | POA: Diagnosis present

## 2021-04-27 DIAGNOSIS — K7460 Unspecified cirrhosis of liver: Secondary | ICD-10-CM | POA: Diagnosis present

## 2021-04-27 DIAGNOSIS — I120 Hypertensive chronic kidney disease with stage 5 chronic kidney disease or end stage renal disease: Secondary | ICD-10-CM | POA: Diagnosis present

## 2021-04-27 DIAGNOSIS — E1142 Type 2 diabetes mellitus with diabetic polyneuropathy: Secondary | ICD-10-CM | POA: Diagnosis present

## 2021-04-27 DIAGNOSIS — Z794 Long term (current) use of insulin: Secondary | ICD-10-CM

## 2021-04-27 DIAGNOSIS — Z91158 Patient's noncompliance with renal dialysis for other reason: Secondary | ICD-10-CM

## 2021-04-27 DIAGNOSIS — K219 Gastro-esophageal reflux disease without esophagitis: Secondary | ICD-10-CM | POA: Diagnosis present

## 2021-04-27 DIAGNOSIS — G40909 Epilepsy, unspecified, not intractable, without status epilepticus: Secondary | ICD-10-CM | POA: Diagnosis not present

## 2021-04-27 DIAGNOSIS — E1122 Type 2 diabetes mellitus with diabetic chronic kidney disease: Secondary | ICD-10-CM | POA: Diagnosis not present

## 2021-04-27 DIAGNOSIS — G9340 Encephalopathy, unspecified: Secondary | ICD-10-CM

## 2021-04-27 DIAGNOSIS — N186 End stage renal disease: Secondary | ICD-10-CM | POA: Diagnosis present

## 2021-04-27 DIAGNOSIS — F329 Major depressive disorder, single episode, unspecified: Secondary | ICD-10-CM | POA: Diagnosis present

## 2021-04-27 DIAGNOSIS — F419 Anxiety disorder, unspecified: Secondary | ICD-10-CM | POA: Diagnosis present

## 2021-04-27 DIAGNOSIS — Z79899 Other long term (current) drug therapy: Secondary | ICD-10-CM

## 2021-04-27 DIAGNOSIS — Z888 Allergy status to other drugs, medicaments and biological substances status: Secondary | ICD-10-CM

## 2021-04-27 DIAGNOSIS — E785 Hyperlipidemia, unspecified: Secondary | ICD-10-CM | POA: Diagnosis present

## 2021-04-27 DIAGNOSIS — D631 Anemia in chronic kidney disease: Secondary | ICD-10-CM | POA: Diagnosis not present

## 2021-04-27 DIAGNOSIS — Z7984 Long term (current) use of oral hypoglycemic drugs: Secondary | ICD-10-CM

## 2021-04-27 DIAGNOSIS — Z992 Dependence on renal dialysis: Secondary | ICD-10-CM

## 2021-04-27 DIAGNOSIS — R001 Bradycardia, unspecified: Secondary | ICD-10-CM | POA: Diagnosis present

## 2021-04-27 DIAGNOSIS — E875 Hyperkalemia: Secondary | ICD-10-CM | POA: Diagnosis not present

## 2021-04-27 DIAGNOSIS — K7290 Hepatic failure, unspecified without coma: Secondary | ICD-10-CM | POA: Diagnosis present

## 2021-04-27 DIAGNOSIS — Z91013 Allergy to seafood: Secondary | ICD-10-CM

## 2021-04-27 DIAGNOSIS — Z905 Acquired absence of kidney: Secondary | ICD-10-CM

## 2021-04-27 DIAGNOSIS — E1165 Type 2 diabetes mellitus with hyperglycemia: Secondary | ICD-10-CM | POA: Diagnosis present

## 2021-04-27 DIAGNOSIS — Z993 Dependence on wheelchair: Secondary | ICD-10-CM

## 2021-04-27 LAB — CBC
HCT: 34.7 % — ABNORMAL LOW (ref 39.0–52.0)
Hemoglobin: 11.2 g/dL — ABNORMAL LOW (ref 13.0–17.0)
MCH: 34.3 pg — ABNORMAL HIGH (ref 26.0–34.0)
MCHC: 32.3 g/dL (ref 30.0–36.0)
MCV: 106.1 fL — ABNORMAL HIGH (ref 80.0–100.0)
Platelets: 157 10*3/uL (ref 150–400)
RBC: 3.27 MIL/uL — ABNORMAL LOW (ref 4.22–5.81)
RDW: 15.9 % — ABNORMAL HIGH (ref 11.5–15.5)
WBC: 5.4 10*3/uL (ref 4.0–10.5)
nRBC: 0 % (ref 0.0–0.2)

## 2021-04-27 LAB — BASIC METABOLIC PANEL
Anion gap: 12 (ref 5–15)
BUN: 56 mg/dL — ABNORMAL HIGH (ref 6–20)
CO2: 13 mmol/L — ABNORMAL LOW (ref 22–32)
Calcium: 7.1 mg/dL — ABNORMAL LOW (ref 8.9–10.3)
Chloride: 114 mmol/L — ABNORMAL HIGH (ref 98–111)
Creatinine, Ser: 9.76 mg/dL — ABNORMAL HIGH (ref 0.61–1.24)
GFR, Estimated: 6 mL/min — ABNORMAL LOW (ref >60)
Glucose, Bld: 72 mg/dL (ref 70–99)
Potassium: >7.5 mmol/L (ref 3.5–5.1)
Sodium: 139 mmol/L (ref 135–145)

## 2021-04-27 LAB — I-STAT CHEM 8, ED
BUN: 54 mg/dL — ABNORMAL HIGH (ref 6–20)
Calcium, Ion: 0.94 mmol/L — ABNORMAL LOW (ref 1.15–1.40)
Chloride: 115 mmol/L — ABNORMAL HIGH (ref 98–111)
Creatinine, Ser: 10.4 mg/dL — ABNORMAL HIGH (ref 0.61–1.24)
Glucose, Bld: 69 mg/dL — ABNORMAL LOW (ref 70–99)
HCT: 35 % — ABNORMAL LOW (ref 39.0–52.0)
Hemoglobin: 11.9 g/dL — ABNORMAL LOW (ref 13.0–17.0)
Potassium: 7.4 mmol/L (ref 3.5–5.1)
Sodium: 137 mmol/L (ref 135–145)
TCO2: 15 mmol/L — ABNORMAL LOW (ref 22–32)

## 2021-04-27 LAB — CBG MONITORING, ED
Glucose-Capillary: 144 mg/dL — ABNORMAL HIGH (ref 70–99)
Glucose-Capillary: 91 mg/dL (ref 70–99)

## 2021-04-27 LAB — CARBAMAZEPINE LEVEL, TOTAL: Carbamazepine Lvl: 9.3 ug/mL (ref 4.0–12.0)

## 2021-04-27 MED ORDER — INSULIN ASPART 100 UNIT/ML IV SOLN
5.0000 [IU] | Freq: Once | INTRAVENOUS | Status: AC
  Administered 2021-04-27: 5 [IU] via INTRAVENOUS

## 2021-04-27 MED ORDER — POLYETHYLENE GLYCOL 3350 17 G PO PACK: 17.0000 g | PACK | Freq: Every day | ORAL | Status: DC | PRN

## 2021-04-27 MED ORDER — HEPARIN SODIUM (PORCINE) 5000 UNIT/ML IJ SOLN
5000.0000 [IU] | Freq: Three times a day (TID) | INTRAMUSCULAR | Status: DC
  Administered 2021-04-28 (×2): 5000 [IU] via SUBCUTANEOUS
  Filled 2021-04-27 (×3): qty 1

## 2021-04-27 MED ORDER — DOCUSATE SODIUM 100 MG PO CAPS: 100.0000 mg | ORAL_CAPSULE | Freq: Two times a day (BID) | ORAL | Status: DC | PRN

## 2021-04-27 MED ORDER — ALBUTEROL SULFATE (2.5 MG/3ML) 0.083% IN NEBU
10.0000 mg | INHALATION_SOLUTION | Freq: Once | RESPIRATORY_TRACT | Status: AC
  Administered 2021-04-27: 10 mg via RESPIRATORY_TRACT
  Filled 2021-04-27: qty 12

## 2021-04-27 MED ORDER — SODIUM ZIRCONIUM CYCLOSILICATE 10 G PO PACK
10.0000 g | PACK | Freq: Once | ORAL | Status: AC
  Administered 2021-04-27: 10 g via ORAL
  Filled 2021-04-27: qty 1

## 2021-04-27 MED ORDER — SODIUM CHLORIDE 0.9 % IV BOLUS
500.0000 mL | Freq: Once | INTRAVENOUS | Status: AC
  Administered 2021-04-27: 500 mL via INTRAVENOUS

## 2021-04-27 MED ORDER — DEXTROSE 50 % IV SOLN
1.0000 | Freq: Once | INTRAVENOUS | Status: AC
  Administered 2021-04-27: 50 mL via INTRAVENOUS
  Filled 2021-04-27: qty 50

## 2021-04-27 MED ORDER — SODIUM BICARBONATE 8.4 % IV SOLN
50.0000 meq | Freq: Once | INTRAVENOUS | Status: AC
  Administered 2021-04-27: 50 meq via INTRAVENOUS
  Filled 2021-04-27: qty 50

## 2021-04-27 MED ORDER — CALCIUM GLUCONATE 10 % IV SOLN
1.0000 g | Freq: Once | INTRAVENOUS | Status: AC
  Administered 2021-04-27: 1 g via INTRAVENOUS
  Filled 2021-04-27: qty 10

## 2021-04-27 MED ORDER — CHLORHEXIDINE GLUCONATE CLOTH 2 % EX PADS
6.0000 | MEDICATED_PAD | Freq: Every day | CUTANEOUS | Status: DC
  Administered 2021-04-28: 6 via TOPICAL

## 2021-04-27 NOTE — ED Triage Notes (Signed)
Pt BIB EMS from [facility] center due to missed dialysis. Per EMS facility is also concerned for his AMS. Pt last had dialysis last Wednesday,  ?

## 2021-04-27 NOTE — Progress Notes (Signed)
Informed of patient in ER. Missed HD yesterday, brought in due to concerns of AMS and missing HD. K 7.4 here, bradycardic, hypotensive. Receiving hyperK treatment now. Will arrange for HD overnight. Overnight HD RN currently running another urgent case. Agree with ICU eval. If no improvement in K or hemodynamics before we can get to him on HD then would advocate CRRT if patient is to be in ICU (would need a temp HD cath). Discussed with our HD RN, orders placed. Full consult to follow. ?

## 2021-04-27 NOTE — Telephone Encounter (Signed)
Faxes still are not going through despite multiple attempts. Spoke with [facility] and obtained fax for patient director Juliene Pina we can use for secure fax of lab results.   [email or web address] ? ?Email has been sent.  ?

## 2021-04-27 NOTE — ED Provider Notes (Signed)
?[facility] ?Provider Note ? ? ?CSN: [id number] ?Arrival date & time: 04/27/21  1934 ? ?  ? ?History ? ?Chief Complaint  ?Patient presents with  ? Altered Mental Status  ? ? ?Eddie Davies is a 51 y.o. male. ? ? ?Altered Mental Status ?Associated symptoms: rash   ?Associated symptoms: no abdominal pain   ?Patient presents with weakness and possible confusion.  Comes from [facility].  Reportedly had dialysis on Friday but missed Monday.  Today is Tuesday.  She he is a Monday Wednesday Friday dialysis patient.  States he is feeling just a little off.  Does not feel short of breath.  Does not feel as if his weight is too bad but feels as if he could use dialysis.  Not hypoxic.  Makes very little urine ?  ?Past Medical History:  ?Diagnosis Date  ? Anxiety disorder, unspecified   ? Cirrhosis ([place]) 04/2018  ? Diabetes mellitus without complication ([place])   ? type II  ? 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 admission  ? Epilepsy, unspecified, not intractable, without status epilepticus ([place])   ? ESRD (end stage renal disease) ([place])   ? MWF   ? Gout, unspecified   ? Heart failure, unspecified ([place])   ? Hypertension   ? Klebsiella pneumoniae (k. pneumoniae) as the cause of diseases classified elsewhere 06/25/2018  ? admission  ? Liver failure ([place])   ? Major depressive disorder, single episode, unspecified   ? Metabolic acidosis 40/9811  ? Pneumonia   ? Polyneuropathy, unspecified   ? Tobacco use   ? Vitamin D deficiency   ? ? ?Home Medications ?Prior to Admission medications   ?Medication Sig Start Date End Date Taking? Authorizing Provider  ?acetaminophen (TYLENOL) 325 MG tablet Take 650 mg by mouth every 8 (eight) hours as needed for fever (pain).    [provider]  ?allopurinol (ZYLOPRIM) 100 MG tablet Take 100 mg by mouth daily.  08/01/18   [provider]  ?Amino Acids-Protein Hydrolys (FEEDING  SUPPLEMENT, PRO-STAT 64,) LIQD Take 30 mLs by mouth in the morning and at bedtime.    [provider]  ?calcium carbonate (TUMS - DOSED IN MG ELEMENTAL CALCIUM) 500 MG chewable tablet Chew 2,000-2,500 mg by mouth See admin instructions. Give 2000 mg three times a day, and an additional 2500 mg once daily on Monday's, Wednesday's, and Friday's. 07/19/19   [provider]  ?carbamazepine (TEGRETOL XR) 100 MG 12 hr tablet Take 100 mg by mouth 2 (two) times daily. Take with 800 mg dose for a total daily dose of 900 mg twice daily    [provider]  ?carbamazepine (TEGRETOL XR) 400 MG 12 hr tablet Take 800 mg by mouth 2 (two) times daily. Take with 100 mg dose for a total daily dose of 900 mg twice daily    [provider]  ?cyclobenzaprine (FLEXERIL) 5 MG tablet Take 5 mg by mouth daily as needed for muscle spasms. 12/02/19   [provider]  ?Dextrose, Diabetic Use, (GLUCOSE) 77.4 % GEL Take 1 Dose by mouth as needed (BG <70).    [provider]  ?DULoxetine (CYMBALTA) 20 MG capsule Take 20 mg by mouth daily.    [provider]  ?gabapentin (NEURONTIN) 100 MG capsule Take 1 capsule (100 mg total) by mouth 3 (three) times daily as needed (neuropathic pain). 01/24/21 02/23/21  Scarlett Presto, MD  ?Glucagon HCl 1 MG  SOLR Inject 1 mg into the muscle as needed (BG <70).    [provider]  ?insulin aspart (NOVOLOG) 100 UNIT/ML injection Inject 3-15 Units into the skin See admin instructions. 5 units three times daily with meals as well as an additional 3 to 15 units three times daily with meals per sliding scale.    [provider]  ?Insulin Glargine (BASAGLAR KWIKPEN) 100 UNIT/ML Inject 25 Units into the skin at bedtime. 08/05/20 03/06/21  Erick Colace, NP  ?Ipratropium-Albuterol (COMBIVENT) 20-100 MCG/ACT AERS respimat Inhale 1 puff into the lungs every 6 (six) hours as needed for wheezing.    [provider]  ?ketoconazole (NIZORAL) 2  % cream Apply 1 application topically 3 (three) times a week. Apply to scalp on bath days    [provider]  ?lactulose (CHRONULAC) 10 GM/15ML solution Take 15 mLs (10 g total) by mouth 2 (two) times daily. ?Patient taking differently: Take 10 g by mouth 2 (two) times daily. Hold morning dose on dialysis days (Monday's, Wednesday's, and Friday's) 05/31/18   [name], Vernelle Emerald, MD  ?lidocaine-prilocaine (EMLA) cream Apply 1 application topically every Monday, Wednesday, and Friday.    [provider]  ?Multiple Vitamin (MULTIVITAMIN WITH MINERALS) TABS tablet Take 1 tablet by mouth daily.    [provider]  ?Nutritional Supplements (FEEDING SUPPLEMENT, NEPRO CARB STEADY,) LIQD Take 220 mLs by mouth at bedtime.    [provider]  ?omeprazole (PRILOSEC) 20 MG capsule Take 20 mg by mouth daily.  07/04/18   [provider]  ?PHENobarbital (LUMINAL) 64.8 MG tablet Take 4 tablets (259.2 mg total) by mouth at bedtime. 12/05/18 04/13/21  [place] Eric J, DO  ?simvastatin (ZOCOR) 20 MG tablet Take 20 mg by mouth at bedtime.     [provider]  ?tamsulosin (FLOMAX) 0.4 MG CAPS capsule Take 0.4 mg by mouth at bedtime.    [provider]  ?TRADJENTA 5 MG TABS tablet Take 5 mg by mouth daily. 08/19/19   [provider]  ?   ? ?Allergies    ?Fish oil, Ethyl acetate, and Omega-3-acid ethyl esters   ? ?Review of Systems   ?Review of Systems  ?Constitutional:  Negative for appetite change.  ?Respiratory:  Negative for shortness of breath.   ?Gastrointestinal:  Negative for abdominal pain.  ?Skin:  Positive for rash.  ? ?Physical Exam ?Updated Vital Signs ?BP 105/63   Pulse 67   Temp 98.3 ?F (36.8 ?C) (Oral)   Resp 10   SpO2 95%  ?Physical Exam ?Vitals and nursing note reviewed.  ?HENT:  ?   Head: Normocephalic.  ?Cardiovascular:  ?   Rate and Rhythm: Regular rhythm.  ?Abdominal:  ?   Tenderness: There is no abdominal tenderness.  ?Musculoskeletal:     ?    General: No tenderness.  ?Skin: ?   General: Skin is warm.  ?   Capillary Refill: Capillary refill takes less than 2 seconds.  ?   Comments: Diffuse chronic skin changes  ?Neurological:  ?   Mental Status: He is alert and oriented to person, place, and time.  ? ? ?ED Results / Procedures / Treatments   ?Labs ?(all labs ordered are listed, but only abnormal results are displayed) ?Labs Reviewed  ?CBC - Abnormal; Notable for the following components:  ?    Result Value  ? RBC 3.27 (*)   ? Hemoglobin 11.2 (*)   ? HCT 34.7 (*)   ? MCV 106.1 (*)   ?  MCH 34.3 (*)   ? RDW 15.9 (*)   ? All other components within normal limits  ?BASIC METABOLIC PANEL - Abnormal; Notable for the following components:  ? Potassium >7.5 (*)   ? Chloride 114 (*)   ? CO2 13 (*)   ? BUN 56 (*)   ? Creatinine, Ser 9.76 (*)   ? Calcium 7.1 (*)   ? GFR, Estimated 6 (*)   ? All other components within normal limits  ?I-STAT CHEM 8, ED - Abnormal; Notable for the following components:  ? Potassium 7.4 (*)   ? Chloride 115 (*)   ? BUN 54 (*)   ? Creatinine, Ser 10.40 (*)   ? Glucose, Bld 69 (*)   ? Calcium, Ion 0.94 (*)   ? TCO2 15 (*)   ? Hemoglobin 11.9 (*)   ? HCT 35.0 (*)   ? All other components within normal limits  ?CARBAMAZEPINE LEVEL, TOTAL  ?CBC  ?CBG MONITORING, ED  ? ? ?EKG ?EKG Interpretation ? ?Date/Time:  Tuesday April 27 2021 22:08:00 EDT ?Ventricular Rate:  43 ?PR Interval:    ?QRS Duration: 161 ?QT Interval:  506 ?QTC Calculation: 428 ?R Axis:   259 ?Text Interpretation: Junctional rhythm Right bundle branch block No P waves, rate slower and QRS wider than earlier today Confirmed by Davonna Belling [phone]) on 04/27/2021 10:10:57 PM ? ?Radiology ?DG Chest Portable 1 View ? ?Result Date: 04/27/2021 ?CLINICAL DATA:  Weakness.  Missed dialysis.  Altered mental status. EXAM: PORTABLE CHEST 1 VIEW COMPARISON:  01/21/2021 FINDINGS: Cardiac enlargement with mild vascular congestion and basilar interstitial changes, likely edema or fluid  overload. No pleural effusions. No pneumothorax. Mediastinal contours appear intact. IMPRESSION: Cardiac enlargement with mild vascular congestion and basilar interstitial edema. Electronically Signed   By: Lucienne Capers

## 2021-04-27 NOTE — H&P (Signed)
? ?NAME:  Eddie Davies, MRN:  [id number], DOB:  [birth year], LOS: 0 ?ADMISSION DATE:  04/27/2021, CONSULTATION DATE:  04/27/2021 ?REFERRING MD: Dr. Alvino Chapel, CHIEF COMPLAINT: Altered mental status ? ?History of Present Illness:  ?End-stage renal disease patient who missed dialysis ?Presented with altered mentation and weakness ? ?Missed his dialysis yesterday, stated he did have dialysis on Friday ? ?Denies any fevers, no shortness of breath ? ?Pertinent  Medical History  ? ?Past Medical History:  ?Diagnosis Date  ? Anxiety disorder, unspecified   ? Cirrhosis ([place]) 04/2018  ? Diabetes mellitus without complication ([place])   ? type II  ? 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 admission  ? Epilepsy, unspecified, not intractable, without status epilepticus ([place])   ? ESRD (end stage renal disease) ([place])   ? MWF   ? Gout, unspecified   ? Heart failure, unspecified ([place])   ? Hypertension   ? Klebsiella pneumoniae (k. pneumoniae) as the cause of diseases classified elsewhere 06/25/2018  ? admission  ? Liver failure ([place])   ? Major depressive disorder, single episode, unspecified   ? Metabolic acidosis 38/3291  ? Pneumonia   ? Polyneuropathy, unspecified   ? Tobacco use   ? Vitamin D deficiency   ? ? ? ?Significant Hospital Events: ?Including procedures, antibiotic start and stop dates in addition to other pertinent events   ?EKG with bradycardia ? ?Interim History / Subjective:  ?Presented with altered mentation, weakness ? ?Objective   ?Blood pressure (!) 95/57, pulse (!) 49, temperature 98.3 ?F (36.8 ?C), temperature source Oral, resp. rate 10, SpO2 100 %. ?   ?   ?No intake or output data in the 24 hours ending 04/27/21 2258 ?There were no vitals filed for this visit. ? ?Examination: ?General: Middle-age, does not appear to be in respiratory distress ?HENT: Dry oral mucosa ?Lungs: Clear breath sounds bowel ?Cardiovascular: S1-S2 ?Abdomen: Bowel sounds  appreciated ?Extremities: No edema ?Neuro: Arousable, moving all extremities ?GU: Minimal urine ? ? ?Labs reviewed ?EKG reviewed ? ?Resolved Hospital Problem list   ? ? ?Assessment & Plan:  ?Hyperkalemia ?-Associated hypotension, bradycardia ?-Appears to be stabilizing with current intervention ?-Repeat labs being drawn ? ?End-stage renal disease on hemodialysis ?Type 2 diabetes ?Epilepsy ?History of heart failure ?Hypertension ?History of liver failure ?Polyneuropathy ? ?-Received calcium, received albuterol nebulizations, received Lokelma, insulin with D50 given ? ?Appreciate renal input ?-For dialysis ? ?Admit to ICU for close monitoring ? ?Home medications will be on hold till reviewed ? ?Best Practice (right click and "Reselect all SmartList Selections" daily)  ? ?Diet/type: NPO ?DVT prophylaxis: prophylactic heparin  ?GI prophylaxis: N/A ?Lines: N/A ?Foley:  N/A ?Code Status:  full code ?Last date of multidisciplinary goals of care discussion [pending] ? ?Labs   ?CBC: ?Recent Labs  ?Lab 04/27/21 ?2028 04/27/21 ?2157  ?WBC 5.4  --   ?HGB 11.2* 11.9*  ?HCT 34.7* 35.0*  ?MCV 106.1*  --   ?PLT 157  --   ? ? ?Basic Metabolic Panel: ?Recent Labs  ?Lab 04/27/21 ?2149 04/27/21 ?2157  ?NA 139 137  ?K >7.5* 7.4*  ?CL 114* 115*  ?CO2 13*  --   ?GLUCOSE 72 69*  ?BUN 56* 54*  ?CREATININE 9.76* 10.40*  ?CALCIUM 7.1*  --   ? ?GFR: ?CrCl cannot be calculated (Unknown ideal weight.). ?Recent Labs  ?Lab 04/27/21 ?2028  ?WBC 5.4  ? ? ?Liver Function Tests: ?No results for input(s): AST, ALT, ALKPHOS, BILITOT, PROT, ALBUMIN in the  last 168 hours. ?No results for input(s): LIPASE, AMYLASE in the last 168 hours. ?No results for input(s): AMMONIA in the last 168 hours. ? ?ABG ?   ?Component Value Date/Time  ? PHART 7.381 08/05/2020 0628  ? PCO2ART 39.3 08/05/2020 0628  ? PO2ART 70 (L) 08/05/2020 4431  ? HCO3 23.4 08/05/2020 0628  ? TCO2 15 (L) 04/27/2021 2157  ? ACIDBASEDEF 2.0 08/05/2020 0628  ? O2SAT 94.0 08/05/2020 0628  ?   ? ?Coagulation Profile: ?No results for input(s): INR, PROTIME in the last 168 hours. ? ?Cardiac Enzymes: ?No results for input(s): CKTOTAL, CKMB, CKMBINDEX, TROPONINI in the last 168 hours. ? ?HbA1C: ?Hgb A1c MFr Bld  ?Date/Time Value Ref Range Status  ?01/21/2021 11:19 PM 8.6 (H) 4.8 - 5.6 % Final  ?  Comment:  ?  (NOTE) ?Pre diabetes:          5.7%-6.4% ? ?Diabetes:              >6.4% ? ?Glycemic control for   <7.0% ?adults with diabetes ?  ?03/12/2019 11:31 PM 6.5 (H) 4.8 - 5.6 % Final  ?  Comment:  ?  (NOTE) ?Pre diabetes:          5.7%-6.4% ?Diabetes:              >6.4% ?Glycemic control for   <7.0% ?adults with diabetes ?  ? ? ?CBG: ?Recent Labs  ?Lab 04/27/21 ?2211  ?GLUCAP 91  ? ? ?Review of Systems:   ?Weakness ? ?Past Medical History:  ?He,  has a past medical history of Anxiety disorder, unspecified, Cirrhosis ([place]) (04/2018), Diabetes mellitus without complication ([place]), Enterocolitis due to Clostridium difficile, not specified as recurrent (08/09/2018), Epilepsy, unspecified, not intractable, without status epilepticus ([place]), ESRD (end stage renal disease) ([place]), Gout, unspecified, Heart failure, unspecified ([place]), Hypertension, Klebsiella pneumoniae (k. pneumoniae) as the cause of diseases classified elsewhere (06/25/2018), Liver failure ([place]), Major depressive disorder, single episode, unspecified, Metabolic acidosis (54/0086), Pneumonia, Polyneuropathy, unspecified, Tobacco use, and Vitamin D deficiency.  ? ?Surgical History:  ? ?Past Surgical History:  ?Procedure Laterality Date  ? APPENDECTOMY  1981  ? AV FISTULA PLACEMENT Right 10/23/2018  ? Procedure: ARTERIOVENOUS (AV) FISTULA CREATION RIGHT ARM;  Surgeon: [name], MD;  Location: [facility] OR;  Service: Vascular;  Laterality: Right;  ? CHOLECYSTECTOMY    ? HERNIA REPAIR  2007  ? IR FLUORO GUIDE CV LINE RIGHT  05/30/2018  ? IR FLUORO GUIDE CV LINE RIGHT  03/18/2019  ? IR FLUORO GUIDE CV LINE RIGHT  03/22/2019  ? IR REMOVAL TUN CV CATH W/O FL   03/15/2019  ? IR SINUS/FIST TUBE CHK-NON GI  04/04/2019  ? IR US GUIDE VASC ACCESS RIGHT  05/30/2018  ? IR US GUIDE VASC ACCESS RIGHT  03/18/2019  ? IR US GUIDE VASC ACCESS RIGHT  03/22/2019  ? NEPHRECTOMY Left 2020  ?  ? ?Social History:  ? reports that he has been smoking cigarettes. He has been smoking an average of 1 pack per day. He has never used smokeless tobacco. He reports that he does not currently use alcohol. He reports that he does not use drugs.  ? ?Family History:  ?His family history is negative for Seizures.  ? ?Allergies ?Allergies  ?Allergen Reactions  ? Fish Oil Swelling and Other (See Comments)  ?   ?  ? Ethyl Acetate   ? Omega-3-Acid Ethyl Esters   ?  ? ?Home Medications  ?Prior to Admission medications   ?Medication  Sig Start Date End Date Taking? Authorizing Provider  ?acetaminophen (TYLENOL) 325 MG tablet Take 650 mg by mouth every 8 (eight) hours as needed for fever (pain).    [provider]  ?allopurinol (ZYLOPRIM) 100 MG tablet Take 100 mg by mouth daily.  08/01/18   [provider]  ?Amino Acids-Protein Hydrolys (FEEDING SUPPLEMENT, PRO-STAT 64,) LIQD Take 30 mLs by mouth in the morning and at bedtime.    [provider]  ?calcium carbonate (TUMS - DOSED IN MG ELEMENTAL CALCIUM) 500 MG chewable tablet Chew 2,000-2,500 mg by mouth See admin instructions. Give 2000 mg three times a day, and an additional 2500 mg once daily on Monday's, Wednesday's, and Friday's. 07/19/19   [provider]  ?carbamazepine (TEGRETOL XR) 100 MG 12 hr tablet Take 100 mg by mouth 2 (two) times daily. Take with 800 mg dose for a total daily dose of 900 mg twice daily    [provider]  ?carbamazepine (TEGRETOL XR) 400 MG 12 hr tablet Take 800 mg by mouth 2 (two) times daily. Take with 100 mg dose for a total daily dose of 900 mg twice daily    [provider]  ?cyclobenzaprine (FLEXERIL) 5 MG tablet Take 5 mg by mouth daily as needed for muscle spasms. 12/02/19    [provider]  ?Dextrose, Diabetic Use, (GLUCOSE) 77.4 % GEL Take 1 Dose by mouth as needed (BG <70).    [provider]  ?DULoxetine (CYMBALTA) 20 MG capsule Take 20 mg by mouth daily.    [provider]

## 2021-04-27 NOTE — Telephone Encounter (Signed)
Reviewed prescription options for Phenobarbital. Patient currently takes 4 tabs of 64.8 mg at night which is total of 259.2 mg. Insurance won't cover this daily quantity per [facility] report. If order was placed for 2 tabs of 97.2 mg plus 1 tab of 64.8 then total would still be 259.2 mg per day. Will discuss options with Dr [name].  ?

## 2021-04-28 DIAGNOSIS — E875 Hyperkalemia: Secondary | ICD-10-CM | POA: Diagnosis not present

## 2021-04-28 DIAGNOSIS — R001 Bradycardia, unspecified: Secondary | ICD-10-CM

## 2021-04-28 DIAGNOSIS — E8721 Acute metabolic acidosis: Secondary | ICD-10-CM

## 2021-04-28 DIAGNOSIS — G9340 Encephalopathy, unspecified: Secondary | ICD-10-CM

## 2021-04-28 DIAGNOSIS — N186 End stage renal disease: Secondary | ICD-10-CM

## 2021-04-28 LAB — COMPREHENSIVE METABOLIC PANEL
ALT: 13 U/L (ref 0–44)
AST: 15 U/L (ref 15–41)
Albumin: 2.5 g/dL — ABNORMAL LOW (ref 3.5–5.0)
Alkaline Phosphatase: 102 U/L (ref 38–126)
Anion gap: 11 (ref 5–15)
BUN: 27 mg/dL — ABNORMAL HIGH (ref 6–20)
CO2: 22 mmol/L (ref 22–32)
Calcium: 7.4 mg/dL — ABNORMAL LOW (ref 8.9–10.3)
Chloride: 105 mmol/L (ref 98–111)
Creatinine, Ser: 6.23 mg/dL — ABNORMAL HIGH (ref 0.61–1.24)
GFR, Estimated: 10 mL/min — ABNORMAL LOW (ref >60)
Glucose, Bld: 138 mg/dL — ABNORMAL HIGH (ref 70–99)
Potassium: 4.2 mmol/L (ref 3.5–5.1)
Sodium: 138 mmol/L (ref 135–145)
Total Bilirubin: 0.5 mg/dL (ref 0.3–1.2)
Total Protein: 6 g/dL — ABNORMAL LOW (ref 6.5–8.1)

## 2021-04-28 LAB — POCT I-STAT 7, (LYTES, BLD GAS, ICA,H+H)
Acid-base deficit: 8 mmol/L — ABNORMAL HIGH (ref 0.0–2.0)
Bicarbonate: 18.4 mmol/L — ABNORMAL LOW (ref 20.0–28.0)
Calcium, Ion: 1.06 mmol/L — ABNORMAL LOW (ref 1.15–1.40)
HCT: 29 % — ABNORMAL LOW (ref 39.0–52.0)
Hemoglobin: 9.9 g/dL — ABNORMAL LOW (ref 13.0–17.0)
O2 Saturation: 95 %
Patient temperature: 98.2
Potassium: 4.9 mmol/L (ref 3.5–5.1)
Sodium: 139 mmol/L (ref 135–145)
TCO2: 20 mmol/L — ABNORMAL LOW (ref 22–32)
pCO2 arterial: 42.3 mmHg (ref 32–48)
pH, Arterial: 7.246 — ABNORMAL LOW (ref 7.35–7.45)
pO2, Arterial: 90 mmHg (ref 83–108)

## 2021-04-28 LAB — RENAL FUNCTION PANEL
Albumin: 2.5 g/dL — ABNORMAL LOW (ref 3.5–5.0)
Anion gap: 14 (ref 5–15)
BUN: 57 mg/dL — ABNORMAL HIGH (ref 6–20)
CO2: 14 mmol/L — ABNORMAL LOW (ref 22–32)
Calcium: 7.1 mg/dL — ABNORMAL LOW (ref 8.9–10.3)
Chloride: 111 mmol/L (ref 98–111)
Creatinine, Ser: 9.82 mg/dL — ABNORMAL HIGH (ref 0.61–1.24)
GFR, Estimated: 6 mL/min — ABNORMAL LOW (ref >60)
Glucose, Bld: 137 mg/dL — ABNORMAL HIGH (ref 70–99)
Phosphorus: 10.9 mg/dL — ABNORMAL HIGH (ref 2.5–4.6)
Potassium: 7.2 mmol/L (ref 3.5–5.1)
Sodium: 139 mmol/L (ref 135–145)

## 2021-04-28 LAB — GLUCOSE, CAPILLARY
Glucose-Capillary: 148 mg/dL — ABNORMAL HIGH (ref 70–99)
Glucose-Capillary: 159 mg/dL — ABNORMAL HIGH (ref 70–99)
Glucose-Capillary: 189 mg/dL — ABNORMAL HIGH (ref 70–99)
Glucose-Capillary: 208 mg/dL — ABNORMAL HIGH (ref 70–99)
Glucose-Capillary: 215 mg/dL — ABNORMAL HIGH (ref 70–99)
Glucose-Capillary: 279 mg/dL — ABNORMAL HIGH (ref 70–99)

## 2021-04-28 LAB — MRSA NEXT GEN BY PCR, NASAL: MRSA by PCR Next Gen: DETECTED — AB

## 2021-04-28 LAB — CBC
HCT: 32 % — ABNORMAL LOW (ref 39.0–52.0)
Hemoglobin: 10.4 g/dL — ABNORMAL LOW (ref 13.0–17.0)
MCH: 34.3 pg — ABNORMAL HIGH (ref 26.0–34.0)
MCHC: 32.5 g/dL (ref 30.0–36.0)
MCV: 105.6 fL — ABNORMAL HIGH (ref 80.0–100.0)
Platelets: 91 10*3/uL — ABNORMAL LOW (ref 150–400)
RBC: 3.03 MIL/uL — ABNORMAL LOW (ref 4.22–5.81)
RDW: 15.7 % — ABNORMAL HIGH (ref 11.5–15.5)
WBC: 5.8 10*3/uL (ref 4.0–10.5)
nRBC: 0 % (ref 0.0–0.2)

## 2021-04-28 LAB — AMMONIA: Ammonia: 32 umol/L (ref 9–35)

## 2021-04-28 LAB — HEMOGLOBIN A1C
Hgb A1c MFr Bld: 9 % — ABNORMAL HIGH (ref 4.8–5.6)
Mean Plasma Glucose: 211.6 mg/dL

## 2021-04-28 LAB — MAGNESIUM: Magnesium: 2.5 mg/dL — ABNORMAL HIGH (ref 1.7–2.4)

## 2021-04-28 LAB — HEPATITIS B SURFACE ANTIGEN: Hepatitis B Surface Ag: NONREACTIVE

## 2021-04-28 LAB — HEPATITIS B SURFACE ANTIBODY,QUALITATIVE: Hep B S Ab: REACTIVE — AB

## 2021-04-28 MED ORDER — SIMVASTATIN 20 MG PO TABS
20.0000 mg | ORAL_TABLET | Freq: Every day | ORAL | Status: DC
  Administered 2021-04-28: 20 mg via ORAL
  Filled 2021-04-28: qty 1

## 2021-04-28 MED ORDER — SODIUM CHLORIDE 0.9 % IV SOLN: 250.0000 mL | INTRAVENOUS | Status: DC

## 2021-04-28 MED ORDER — HEPARIN SODIUM (PORCINE) 1000 UNIT/ML DIALYSIS: 1500.0000 [IU] | Freq: Once | INTRAMUSCULAR | Status: DC

## 2021-04-28 MED ORDER — SODIUM CHLORIDE 0.9 % IV BOLUS
500.0000 mL | Freq: Once | INTRAVENOUS | Status: DC
Start: 2021-04-28 — End: 2021-04-28

## 2021-04-28 MED ORDER — SODIUM CHLORIDE 0.9 % IV SOLN: 100.0000 mL | INTRAVENOUS | Status: DC | PRN

## 2021-04-28 MED ORDER — PHENOBARBITAL 32.4 MG PO TABS
64.8000 mg | ORAL_TABLET | Freq: Every day | ORAL | Status: DC
  Administered 2021-04-28: 64.8 mg via ORAL
  Filled 2021-04-28: qty 2

## 2021-04-28 MED ORDER — PANTOPRAZOLE SODIUM 40 MG PO TBEC
40.0000 mg | DELAYED_RELEASE_TABLET | Freq: Every day | ORAL | Status: DC
  Administered 2021-04-28: 40 mg via ORAL
  Filled 2021-04-28: qty 1

## 2021-04-28 MED ORDER — SODIUM CHLORIDE 0.45 % IV BOLUS
500.0000 mL | Freq: Once | INTRAVENOUS | Status: AC
  Administered 2021-04-28: 500 mL via INTRAVENOUS

## 2021-04-28 MED ORDER — SODIUM CHLORIDE 0.9 % IV BOLUS: 500.0000 mL | Freq: Once | INTRAVENOUS | Status: DC

## 2021-04-28 MED ORDER — NOREPINEPHRINE 4 MG/250ML-% IV SOLN
2.0000 ug/min | INTRAVENOUS | Status: DC
  Administered 2021-04-28: 2 ug/min via INTRAVENOUS
  Filled 2021-04-28: qty 250

## 2021-04-28 MED ORDER — INSULIN GLARGINE-YFGN 100 UNIT/ML ~~LOC~~ SOLN
15.0000 [IU] | Freq: Every day | SUBCUTANEOUS | Status: DC
  Filled 2021-04-28: qty 0.15

## 2021-04-28 MED ORDER — INSULIN ASPART 100 UNIT/ML IJ SOLN
0.0000 [IU] | INTRAMUSCULAR | Status: DC
  Administered 2021-04-28: 3 [IU] via SUBCUTANEOUS
  Administered 2021-04-28: 2 [IU] via SUBCUTANEOUS

## 2021-04-28 MED ORDER — DOXERCALCIFEROL 4 MCG/2ML IV SOLN: 3.0000 ug | INTRAVENOUS | Status: DC

## 2021-04-28 MED ORDER — LIDOCAINE HCL (PF) 1 % IJ SOLN: 5.0000 mL | INTRAMUSCULAR | Status: DC | PRN

## 2021-04-28 MED ORDER — HEPARIN SODIUM (PORCINE) 1000 UNIT/ML DIALYSIS
1000.0000 [IU] | INTRAMUSCULAR | Status: DC | PRN
  Filled 2021-04-28: qty 1

## 2021-04-28 MED ORDER — DULOXETINE HCL 20 MG PO CPEP
20.0000 mg | ORAL_CAPSULE | Freq: Every day | ORAL | Status: DC
  Administered 2021-04-28: 20 mg via ORAL
  Filled 2021-04-28: qty 1

## 2021-04-28 MED ORDER — ALTEPLASE 2 MG IJ SOLR
2.0000 mg | Freq: Once | INTRAMUSCULAR | Status: DC | PRN
Start: 2021-04-28 — End: 2021-04-29

## 2021-04-28 MED ORDER — SODIUM BICARBONATE 8.4 % IV SOLN
50.0000 meq | Freq: Once | INTRAVENOUS | Status: AC
  Administered 2021-04-28: 50 meq via INTRAVENOUS
  Filled 2021-04-28: qty 50

## 2021-04-28 MED ORDER — DEXTROSE 50 % IV SOLN
1.0000 | Freq: Once | INTRAVENOUS | Status: AC
  Administered 2021-04-28: 50 mL via INTRAVENOUS
  Filled 2021-04-28: qty 50

## 2021-04-28 MED ORDER — LIDOCAINE-PRILOCAINE 2.5-2.5 % EX CREA: 1.0000 "application " | TOPICAL_CREAM | CUTANEOUS | Status: DC | PRN

## 2021-04-28 MED ORDER — INSULIN ASPART 100 UNIT/ML IV SOLN
5.0000 [IU] | Freq: Once | INTRAVENOUS | Status: AC
  Administered 2021-04-28: 5 [IU] via INTRAVENOUS

## 2021-04-28 MED ORDER — ALBUTEROL SULFATE (2.5 MG/3ML) 0.083% IN NEBU
10.0000 mg | INHALATION_SOLUTION | Freq: Once | RESPIRATORY_TRACT | Status: AC
  Administered 2021-04-28: 10 mg via RESPIRATORY_TRACT
  Filled 2021-04-28: qty 12

## 2021-04-28 MED ORDER — CARBAMAZEPINE ER 200 MG PO TB12
900.0000 mg | ORAL_TABLET | Freq: Two times a day (BID) | ORAL | Status: DC
  Administered 2021-04-28 (×2): 900 mg via ORAL
  Filled 2021-04-28 (×2): qty 1

## 2021-04-28 MED ORDER — PENTAFLUOROPROP-TETRAFLUOROETH EX AERO: 1.0000 "application " | INHALATION_SPRAY | CUTANEOUS | Status: DC | PRN

## 2021-04-28 NOTE — Discharge Summary (Addendum)
Physician Discharge Summary  ? ?  ? ? ? ? ?Patient ID: ?Eddie Davies ?MRN: [id number] ?DOB/AGE: July 12, 1970 51 y.o. ? ?Admit date: 04/27/2021 ?Discharge date: 04/28/2021 ? ?Discharge Diagnoses:   ? ?Active Hospital Problems  ? Diagnosis Date Noted  ? Hyperkalemia   ? Acute encephalopathy   ? Bradycardia   ?  ?Resolved Hospital Problems  ?No resolved problems to display.  ?  ? ? ?Discharge summary   ? ?Patient is a 51 yo M w/ pertinent PMH of ESRD HD MWF, seizure, DMT2 presents to [facility] ED 4/4 w/ AMS and weakness. ?  ?Patient states that he missed dialysis on 4/3 due to having diarrhea. Patient takes lactulose at home. Denies fever and sob. ?  ?Came into [facility] ED 4/4 w/ AMS and weakness. Afebrile. Patient follows commands but lethargic. SBP on softer side from 80-110s. HR 50s. K > 7.5, co2 13. ABG 7.24, 42, 90, 18. WBC 5.4, hgb 11.2. Calcium, albuterol nebulizations, received Lokelma, insulin with D50 given. Nephro consulted. Recommended ICU admission for dialysis w/ soft BP. PCCM consulted for ICU admission. ? ?Patient received HD early in am 4/5. Required a low dose pressor (levo) during dialysis. Patient is much more awake today. K now 4.2. HR and BP stable. Patient back to baseline and recovered much quicker than expected. Will discharge today back to nursing home.  ? ?Discharge Plan by Active Problems   ? ?Hyperkalemia ?Hyperphosphatemia ?ESRD on hemodialysis MWF ?Bradycardia ?Plan: ?-continue hemodialysis MWF ?-please f/u w/ PCP within 4 weeks of discharge to f/u bmp ?-Avoid nephrotoxic agents ?  ?Acute encephalopathy: improved after HD ?Plan:  ?-continue normally scheduled HD ?-stopped gabapentin for polyneuropathy due to sedating effects ?  ?Mild pulm edema ?Plan: ?-CXR outpatient as needed ?  ?Diarrhea ?Plan: ?-holding home lactulose ?-continue to monitor  ?-supportive care ?  ?Type 2 diabetes ?Plan: ?-resume home insulin ?-cbg monitoring ?  ?Anemia of chronic ESRD ?Thrombocytopenia ?Plan: ?-f/u outpatient  with PCP ?-may need f/u cbc ?  ?Hx of Epilepsy ?Plan ?-resume home seizure meds: carbamazepine, phenobarb ?  ?History of heart failure ?Hypertension ?HLD ?Plan: ?-f/u w/ PCP ?-no home BP meds at home listed ?-resume home statin ?-daily weights ?  ?History of liver failure ?P: ?-f/u PCP ?  ?Polyneuropathy ?P: ?-f/u PCP ?-patient states gabapentin made him lethargic; hold gabapentin ?-consider lyrica ?  ?MDD ?Anxiety ?P: ?-resume cymbalta ?  ?GERD ?P: ?-resume PPI ? ? ?Significant Hospital tests/ studies  ? ?Procedures   ? ?Culture data/antimicrobials   ? ?  ?Consults  ?Nephrology ?  ? ?Discharge Exam: ?BP 129/66   Pulse 78   Temp 98.2 ?F (36.8 ?C) (Oral)   Resp 10   Ht 6' 2"  (1.88 m)   Wt 107.8 kg   SpO2 98%   BMI 30.51 kg/m?  ? ?General:   NAD ?HEENT: MM pink/moist ?Neuro: Aox3; MAE; feels sleepy but arouses easy ?CV: s1s2, no m/r/g ?PULM:  dim clear BS bilaterally; on room air ?GI: soft, bsx4 active  ?Extremities: warm/dry, scaly skin UE and LE ? ?Labs at discharge  ? ?Lab Results  ?Component Value Date  ? CREATININE 6.23 (H) 04/28/2021  ? BUN 27 (H) 04/28/2021  ? NA 138 04/28/2021  ? K 4.2 04/28/2021  ? CL 105 04/28/2021  ? CO2 22 04/28/2021  ? ?Lab Results  ?Component Value Date  ? WBC 5.8 04/28/2021  ? HGB 9.9 (L) 04/28/2021  ? HCT 29.0 (L) 04/28/2021  ? MCV 105.6 (H) 04/28/2021  ?  PLT 91 (L) 04/28/2021  ? ?Lab Results  ?Component Value Date  ? ALT 13 04/28/2021  ? AST 15 04/28/2021  ? ALKPHOS 102 04/28/2021  ? BILITOT 0.5 04/28/2021  ? ?Lab Results  ?Component Value Date  ? INR 1.3 (H) 08/04/2020  ? INR 1.2 03/18/2019  ? INR 1.7 (H) 03/12/2019  ? ? ?Current radiological studies   ? ?DG Chest Portable 1 View ? ?Result Date: 04/27/2021 ?CLINICAL DATA:  Weakness.  Missed dialysis.  Altered mental status. EXAM: PORTABLE CHEST 1 VIEW COMPARISON:  01/21/2021 FINDINGS: Cardiac enlargement with mild vascular congestion and basilar interstitial changes, likely edema or fluid overload. No pleural effusions. No  pneumothorax. Mediastinal contours appear intact. IMPRESSION: Cardiac enlargement with mild vascular congestion and basilar interstitial edema. Electronically Signed   By: Lucienne Capers M.D.   On: 04/27/2021 20:31   ? ?Disposition:  ?Nursing home ?  ? ? ? ?Allergies as of 04/28/2021   ? ?   Reactions  ? Fish Oil Swelling, Other (See Comments)  ?   ? Ethyl Acetate   ? Omega-3-acid Ethyl Esters   ? ?  ? ?  ?Medication List  ?  ? ?STOP taking these medications   ? ?calcium carbonate 500 MG chewable tablet ?Commonly known as: TUMS - dosed in mg elemental calcium ?  ?cyclobenzaprine 5 MG tablet ?Commonly known as: FLEXERIL ?  ?feeding supplement (NEPRO CARB STEADY) Liqd ?  ?feeding supplement (PRO-STAT 64) Liqd ?  ?gabapentin 100 MG capsule ?Commonly known as: NEURONTIN ?  ?Glucagon HCl 1 MG Solr ?  ?Glucose 77.4 % Gel ?  ?ketoconazole 2 % cream ?Commonly known as: NIZORAL ?  ?lactulose 10 GM/15ML solution ?Commonly known as: [place] ?  ?lidocaine-prilocaine cream ?Commonly known as: EMLA ?  ? ?  ? ?TAKE these medications   ? ?acetaminophen 325 MG tablet ?Commonly known as: TYLENOL ?Take 650 mg by mouth every 8 (eight) hours as needed for fever (pain). ?  ?allopurinol 100 MG tablet ?Commonly known as: ZYLOPRIM ?Take 100 mg by mouth daily. ?  ?Basaglar KwikPen 100 UNIT/ML ?Inject 25 Units into the skin at bedtime. ?  ?carbamazepine 400 MG 12 hr tablet ?Commonly known as: TEGRETOL XR ?Take 800 mg by mouth 2 (two) times daily. Take with 100 mg dose for a total daily dose of 900 mg twice daily ?  ?carbamazepine 100 MG 12 hr tablet ?Commonly known as: TEGRETOL XR ?Take 100 mg by mouth 2 (two) times daily. Take with 800 mg dose for a total daily dose of 900 mg twice daily ?  ?DULoxetine 20 MG capsule ?Commonly known as: CYMBALTA ?Take 20 mg by mouth daily. ?  ?insulin aspart 100 UNIT/ML injection ?Commonly known as: novoLOG ?Inject 5 Units into the skin 3 (three) times daily with meals. ?  ?insulin aspart 100 UNIT/ML  injection ?Commonly known as: novoLOG ?Inject 3-15 Units into the skin See admin instructions. 3 times daily per sliding scale ?140-180 = 3 units ?181-241 = 4 units  ?242-300 = 6 units ?301-350 = 8 units ?351-400 =  12 units ?401-450 = 15 units ?  ?Ipratropium-Albuterol 20-100 MCG/ACT Aers respimat ?Commonly known as: COMBIVENT ?Inhale 1 puff into the lungs every 6 (six) hours as needed for wheezing. ?  ?multivitamin with minerals Tabs tablet ?Take 1 tablet by mouth daily. ?  ?omeprazole 20 MG capsule ?Commonly known as: PRILOSEC ?Take 20 mg by mouth daily. ?  ?PHENobarbital 64.8 MG tablet ?Commonly known as: LUMINAL ?Take 4 tablets (259.2  mg total) by mouth at bedtime. ?  ?simvastatin 20 MG tablet ?Commonly known as: ZOCOR ?Take 20 mg by mouth at bedtime. ?  ?tamsulosin 0.4 MG Caps capsule ?Commonly known as: FLOMAX ?Take 0.4 mg by mouth at bedtime. ?  ?Tradjenta 5 MG Tabs tablet ?Generic drug: linagliptin ?Take 5 mg by mouth daily. ?  ? ?  ?  ? ?Follow-up appointment   ?F/u PCP within 4 weeks of discharge ?Discharge Condition:   ? stable ? ?Physician Statement:   ?The Patient was personally examined, the discharge assessment and plan has been personally reviewed and I agree with ACNP Yash Cacciola's assessment and plan. 35 minutes of time have been dedicated to discharge assessment, planning and discharge instructions.  ? ?Signed: ?Mick Sell ?04/28/2021, 2:07 PM ? ? ?

## 2021-04-28 NOTE — Progress Notes (Signed)
eLink Physician-Brief Progress Note ?Patient Name: Eddie Davies ?DOB: 20-Jul-1970 ?MRN: [id number] ? ? ?Date of Service ? 04/28/2021  ?HPI/Events of Note ? Nursing reports that patient is more lethargic and harder to arouse. Just got hooked up to HD. Last blood glucose = 215.  ?eICU Interventions ? Plan: ?ABG STAT.  ? ? ? ?Intervention Category ?Major Interventions: Change in mental status - evaluation and management ? ?Zeven Kocak Cornelia Copa ?04/28/2021, 4:11 AM ?

## 2021-04-28 NOTE — Progress Notes (Signed)
Patient arrived to the unit with the following patient belongings at bedside: Cell phone, Stylist pen, and cell Pensions consultant.  ?

## 2021-04-28 NOTE — Progress Notes (Signed)
An USGPIV (ultrasound guided PIV) has been placed for short-term vasopressor infusion. A correctly placed ivWatch must be used when administering Vasopressors. Should this treatment be needed beyond 72 hours, central line access should be obtained.  It will be the responsibility of the bedside nurse to follow best practice to prevent extravasations.   ?

## 2021-04-28 NOTE — Care Management (Signed)
?  Transition of Care (TOC) Screening Note ? ? ?Patient Details  ?Name: Eddie Davies ?Date of Birth: 10/23/1970 ? ? ?Transition of Care (TOC) CM/SW Contact:    ?Carles Collet, RN ?Phone Number: ?04/28/2021, 7:50 AM ? ? ? ?Transition of Care Department [facility]) has reviewed patient and we will continue to monitor patient advancement through interdisciplinary progression rounds.  ? ?End-stage renal disease patient who missed dialysis ?Presented with altered mentation and weakness ?  ?

## 2021-04-28 NOTE — Progress Notes (Signed)
Taking over care of this patient from [place] RN at this time.  ?

## 2021-04-28 NOTE — TOC Transition Note (Signed)
Transition of Care (TOC) - CM/SW Discharge Note ? ? ?Patient Details  ?Name: Eddie Davies ?MRN: [id number] ?Date of Birth: March 24, 1970 ? ?Transition of Care (TOC) CM/SW Contact:  ?Paulene Floor Patsy Varma, LCSWA ?Phone Number: ?04/28/2021, 2:21 PM ? ? ?Clinical Narrative:    ?Patient will DC to:  [facility] SNF ?Anticipated DC date:  04/28/2021 ?Family notified: Yes ?Transport by:  Corey Harold ? ? ?Per MD patient ready for DC to SNF. RN to call report prior to discharge [phone] room 103b. RN, patient, patient's family, and facility notified of DC. Discharge Summary sent to facility. DC packet on chart. Ambulance transport will be requested for patient.  ? ?CSW will sign off for now as social work intervention is no longer needed. Please consult [place] again if new needs arise. ?  ? ? ?Final next level of care: [place] ?Barriers to Discharge: No Barriers Identified ? ? ?Patient Goals and CMS Choice ?  ?  ?  ? ?Discharge Placement ?  ?           ?Patient chooses bed at: [facility] ?Patient to be transferred to facility by: [facility] ?Name of family member notified: Mosie Lukes (Other)   [phone] ?Patient and family notified of of transfer: 04/28/21 ? ?Discharge Plan and Services ?  ?  ?           ?  ?  ?  ?  ?  ?  ?  ?  ?  ?  ? ?Social Determinants of Health (SDOH) Interventions ?  ? ? ?Readmission Risk Interventions ? ?  03/19/2019  ?  9:57 AM 11/29/2018  ? 10:12 AM  ?Readmission Risk Prevention Plan  ?Transportation Screening Complete Complete  ?Medication Review (RN Care Manager) Referral to Pharmacy Complete  ?PCP or Specialist appointment within 3-5 days of discharge Not Complete Not Complete  ?PCP/Specialist Appt Not Complete comments [facility] SNF resident pt is SNF resident  ?[place] or Home Care Consult Not Complete Not Complete  ?[place] or Home Care Consult Pt Refusal Comments [facility] SNF resident pt is SNF resident  ?[facility] Recovery Care/Counseling Consult Complete Complete  ?Palliative Care  Screening Not Applicable Not Applicable  ?Skilled Nursing Facility Complete Complete  ? ? ? ? ? ?

## 2021-04-28 NOTE — Discharge Instructions (Addendum)
-  Follow up with PCP in 4 weeks ?-Continue scheduled dialysis treatments: Monday, Wednesday, Friday ?

## 2021-04-28 NOTE — TOC Progression Note (Signed)
Transition of Care (TOC) - Initial/Assessment Note  ? ? ?Patient Details  ?Name: Eddie Davies ?MRN: [id number] ?Date of Birth: 05-16-1970 ? ?Transition of Care (TOC) CM/SW Contact:    ?Paulene Floor Reighan Hipolito, LCSWA ?Phone Number: ?04/28/2021, 1:26 PM ? ?Clinical Narrative:                 ?CSW received notification from PA that patient is medically ready to return to the facility.  CSW contacted Kim with admissions at [facility] and was informed that the patient can return to the today.  ? ?Pending: d/c summary ? ?  ?  ? ? ?Patient Goals and CMS Choice ?  ?  ?  ? ?Expected Discharge Plan and Services ?  ?  ?  ?  ?  ?                ?  ?  ?  ?  ?  ?  ?  ?  ?  ?  ? ?Prior Living Arrangements/Services ?  ?  ?  ?       ?  ?  ?  ?  ? ?Activities of Daily Living ?  ?  ? ?Permission Sought/Granted ?  ?  ?   ?   ?   ?   ? ?Emotional Assessment ?  ?  ?  ?  ?  ?  ? ?Admission diagnosis:  Hyperkalemia [E87.5] ?End stage renal disease on dialysis ([place]) [N18.6, Z99.2] ?Patient Active Problem List  ? Diagnosis Date Noted  ? Acute encephalopathy   ? Bradycardia   ? Cutaneous abscess of buttock   ? Hyperglycemia   ? Hyperkalemia   ? DKA (diabetic ketoacidosis) ([place]) 08/04/2020  ? Hypertension   ? Diabetes mellitus without complication ([place])   ? Tobacco use   ? Polyneuropathy, unspecified   ? Pneumonia   ? Major depressive disorder, single episode, unspecified   ? Liver failure ([place])   ? Heart failure, unspecified ([place])   ? Gout, unspecified   ? ESRD (end stage renal disease) on dialysis [facility])   ? Epilepsy, unspecified, not intractable, without status epilepticus ([place])   ? Anxiety disorder, unspecified   ? Chest pain, unspecified 01/27/2020  ? Other fluid overload 04/10/2019  ? Allergy, unspecified, initial encounter 04/06/2019  ? Retroperitoneal abscess ([place]) 03/13/2019  ? Infection due to ESBL-producing Klebsiella pneumoniae 03/13/2019  ? Personal history of COVID-19 02/25/2019  ? Postprocedural hematoma of a genitourinary  system organ or structure following a genitourinary system procedure 02/14/2019  ? Coagulopathy ([place]) 02/06/2019  ? Failure to thrive in adult 01/21/2019  ? 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  ? Protein-calorie malnutrition, severe ([place]) 01/21/2019  ? End stage liver disease ([place]) 01/09/2019  ? COVID-19 virus infection 01/09/2019  ? History of ESBL Klebsiella pneumoniae infection 01/09/2019  ? Septic shock ([place]) 01/09/2019  ? ESRD on hemodialysis ([place]) 12/27/2018  ? Gram negative sepsis ([place]) 12/27/2018  ? History of Clostridioides difficile colitis 12/27/2018  ? Anemia in chronic kidney disease 08/28/2018  ? Secondary hyperparathyroidism of renal origin ([place]) 08/28/2018  ? 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 Pain, unspecified 07/09/2018  ? Klebsiella pneumoniae (k. pneumoniae) as the cause of diseases classified elsewhere 06/25/2018  ? Bacteremia due to Klebsiella pneumoniae 06/19/2018  ? Fever 06/18/2018  ? Anemia in ESRD (end-stage renal disease) ([place]) 06/18/2018  ? Anemia due to chronic kidney disease 06/18/2018  ? Leukocytosis 06/18/2018  ? Diarrhea, unspecified 06/08/2018  ?  Dyspnea, unspecified 06/01/2018  ? Encounter for screening for respiratory tuberculosis 06/01/2018  ? Other cirrhosis of liver ([place]) 06/01/2018  ? Seizures ([place]) 05/24/2018  ? Benign prostatic hyperplasia without lower urinary tract symptoms 05/24/2018  ? Sepsis ([place]) 05/24/2018  ? Benign essential HTN 05/24/2018  ? Thrombocytopenia ([place]) 05/24/2018  ? Metabolic acidosis 68/37/2902  ? Metabolic bone disease 12/09/5206  ? Iron deficiency anemia 05/11/2018  ? Vitamin D deficiency 05/11/2018  ? Cirrhosis ([place]) 04/2018  ? Respiratory failure ([place])   ? Pressure injury of skin 04/21/2018  ? Altered mental status, unspecified 04/21/2018  ? HCAP (healthcare-associated pneumonia) 04/21/2018  ? Anemia due to chronic kidney disease, on chronic dialysis ([place]) 04/07/2018  ? Closed fracture of left  upper extremity with routine healing 03/20/2018  ? Difficult intravenous access 03/16/2018  ? Delirium 03/14/2018  ? Hyperammonemia ([place]) 03/14/2018  ? Hypocalcemia 03/08/2018  ? Urinary calculus, unspecified 03/08/2018  ? Physical deconditioning 03/08/2018  ? Pressure injury of lower back, stage 2 ([place]) 03/08/2018  ? HTN (hypertension) 03/08/2018  ? Other hypoglycemia 03/08/2018  ? Closed intertrochanteric fracture of left femur ([place]) 01/21/2018  ? Fall 01/21/2018  ? Fracture of left ulna 01/21/2018  ? Fracture of left tibia 01/21/2018  ? Trauma 01/21/2018  ? Diabetic ulcer of toe associated with type 2 diabetes mellitus, limited to breakdown of skin ([place]) 02/14/2017  ? Hyperosmolar non-ketotic state in patient with type 2 diabetes mellitus ([place]) 02/14/2017  ? Anemia 12/08/2016  ? Diabetic ketoacidosis ([place]) 12/08/2016  ? GERD (gastroesophageal reflux disease) 12/08/2016  ? Hyponatremia 12/08/2016  ? 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  ? Open wound of left foot except toes with complication 03/18/3610  ? Hyperlipidemia 06/22/2016  ? Type 2 diabetes mellitus with unspecified complications ([place]) 24/49/7530  ? Benign hypertension with chronic kidney disease, stage III ([place]) 06/22/2016  ? Abscess of scrotum 09/19/2014  ? Cellulitis of toe of right foot 09/19/2014  ? Axillary abscess 02/03/2014  ? Hypokalemia 02/03/2014  ? ?PCP:  [name], [name] ?Pharmacy:   ?[facility] [place] ?[address] [place] [place] [address] ?Phone: [phone] Fax: [phone] ? ? ? ? ?Social Determinants of Health (SDOH) Interventions ?  ? ?Readmission Risk Interventions ? ?  03/19/2019  ?  9:57 AM 11/29/2018  ? 10:12 AM  ?Readmission Risk Prevention Plan  ?Transportation Screening Complete Complete  ?Medication Review (RN Care Manager) Referral to Pharmacy Complete  ?PCP or Specialist appointment within 3-5 days of discharge Not Complete Not Complete  ?PCP/Specialist Appt Not Complete comments  LTC SNF resident pt is SNF resident  ?[place] or Home Care Consult Not Complete Not Complete  ?[place] or Home Care Consult Pt Refusal Comments LTC SNF resident pt is SNF resident  ?SW Recovery Care/Counseling Consult Complete Complete  ?Palliative Care Screening Not Applicable Not Applicable  ?Skilled Nursing Facility Complete Complete  ? ? ? ?

## 2021-04-28 NOTE — Progress Notes (Signed)
Report called to nurse, Secret, at [facility]. [phone] ? ?Loni Muse, RN ?

## 2021-04-28 NOTE — Progress Notes (Signed)
? ?NAME:  Nichael Ehly, MRN:  [id number], DOB:  05/19/1970, LOS: 1 ?ADMISSION DATE:  04/27/2021, CONSULTATION DATE:  04/27/2021 ?REFERRING MD: Dr. Alvino Chapel, CHIEF COMPLAINT: Altered mental status ? ?History of Present Illness:  ?Patient is a 51 yo M w/ pertinent PMH of ESRD HD MWF, seizure, DMT2 presents to [facility] ED 4/4 w/ AMS and weakness. ? ?Patient states that he missed dialysis on 4/3 due to having diarrhea. Patient takes lactulose at home. Denies fever and sob. ? ?Came [facility] ED 4/4 w/ AMS and weakness. Afebrile. Patient follows commands but lethargic. SBP on softer side from 80-110s. HR 50s. K > 7.5, co2 13. ABG 7.24, 42, 90, 18. Calcium, albuterol nebulizations, received Lokelma, insulin with D50 given. Nephro consulted. Recommended ICU admission for HD w/ soft BP. PCCM consulted for ICU admission. ? ? ? ?Pertinent  Medical History  ? ?Past Medical History:  ?Diagnosis Date  ? Anxiety disorder, unspecified   ? Cirrhosis ([place]) 04/2018  ? Diabetes mellitus without complication ([place])   ? type II  ? 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 admission  ? Epilepsy, unspecified, not intractable, without status epilepticus ([place])   ? ESRD (end stage renal disease) ([place])   ? MWF   ? Gout, unspecified   ? Heart failure, unspecified ([place])   ? Hypertension   ? Klebsiella pneumoniae (k. pneumoniae) as the cause of diseases classified elsewhere 06/25/2018  ? admission  ? Liver failure ([place])   ? Major depressive disorder, single episode, unspecified   ? Metabolic acidosis 91/6384  ? Pneumonia   ? Polyneuropathy, unspecified   ? Tobacco use   ? Vitamin D deficiency   ? ? ? ?Significant Hospital Events: ?Including procedures, antibiotic start and stop dates in addition to other pertinent events   ?4/4 admitted to [facility]; missed HD; EKG with bradycardia ? ?Interim History / Subjective:  ?Patient awake and following commands this am; states he is sleepy and didn't sleep well  overnight ?BP stable; off levo ?Received HD overnight ?On room air ? ?Objective   ?Blood pressure 121/73, pulse 78, temperature 98.4 ?F (36.9 ?C), temperature source Oral, resp. rate 15, height 6' 2"  (1.88 m), weight 107.8 kg, SpO2 99 %. ?   ?   ? ?Intake/Output Summary (Last 24 hours) at 04/28/2021 0926 ?Last data filed at 04/28/2021 0730 ?Gross per 24 hour  ?Intake 516 ml  ?Output 2500 ml  ?Net -1984 ml  ? ?Filed Weights  ? 04/28/21 0233 04/28/21 0730  ?Weight: 110.3 kg 107.8 kg  ? ? ?Examination: ?General:   NAD ?HEENT: MM pink/moist ?Neuro: Aox3; MAE; feels sleepy but arouses easy ?CV: s1s2, no m/r/g ?PULM:  dim clear BS bilaterally; on room air ?GI: soft, bsx4 active  ?Extremities: warm/dry, scaly skin UE and LE ? ?Repeat cmp/cbc pending this am ? ?Resolved Hospital Problem list   ? ? ?Assessment & Plan:  ? ?Hyperkalemia ?Hyperphosphatemia ?End-stage renal disease on hemodialysis MWF ?P: ?-nephro following; appreciate recs ?-required low dose levo during HD overnight; now off levo ?-continue iHD per nephro ?-recheck BMP ?-Trend BMP / urinary output ?-Replace electrolytes as indicated ?-Avoid nephrotoxic agents, ensure adequate renal perfusion ? ?Bradycardia: improved ?P: ?-telemetry monitoring ? ?Acute encephalopathy: improved after HD ?P: ?-patient states recently started on gabapentin 2 weeks ago which makes him "loopy" ?-likely due to missing HD ?-co2 mildly elevated ?-check uds and ammonia ? ?Mild pulm edema ?P: ?-on room air ?-trend cxr ? ?Diarrhea ?P: ?-supportive  care ?-hold home lactulose ? ?Type 2 diabetes ?P: ?-ssi and cbg monitoring ?-adding basal insulin ?-advance diet today ? ?Anemia of chronic ESRD ?Thrombocytopenia ?P: ?-trend CBC ? ?Hx of Epilepsy ?P: ?-resume home seizure meds: carbamazepine, phenobarb ? ?History of heart failure ?Hypertension ?HLD ?P: ?-no home BP meds at home listed ?-resume home statin ?-daily weights; strict I/o's ? ?History of liver failure ?P: ?-trend cmp ?-supportive  care ? ?Polyneuropathy ?P: ?-patient states gabapentin made him lethargic; hold gabapentin ?-consider lyrica ? ?MDD ?Anxiety ?P: ?-continue cymbalta ? ?GERD ?P: ?-PPI ? ? ?Best Practice (right click and "Reselect all SmartList Selections" daily)  ? ?Diet/type: NPO ?DVT prophylaxis: prophylactic heparin  ?GI prophylaxis: PPI ?Lines: N/A ?Foley:  N/A ?Code Status:  full code ?Last date of multidisciplinary goals of care discussion [4/5 spoke with patient at bedside and updated] ? ? ? ?Mikki Harbor, [name] ? Pulmonary & Critical Care ?04/28/2021, 11:15 AM ? ?Please see [email or web address] for pager details. ? ?From 7A-7P if no response, please call [phone]. ?After hours, please call Warren Lacy [phone]. ? ? ? ? ?

## 2021-04-28 NOTE — Progress Notes (Signed)
Pt receives out-pt HD at [facility] on MWF. Contacted clinic to make them aware that pt will return to snf today and should resume care on Friday. Communicated with nephrologist who confirms that pt should be ok to resume at clinic Friday with no extra treatment needed tomorrow.  ? ?Melven Sartorius ?Renal Navigator ?[phone] ?

## 2021-04-28 NOTE — Progress Notes (Signed)
eLink Physician-Brief Progress Note ?Patient Name: Eddie Davies ?DOB: Apr 08, 1970 ?MRN: [id number] ? ? ?Date of Service ? 04/28/2021  ?HPI/Events of Note ? 51 yo male with PMH of ESRD on HD. Missed his dialysis yesterday, stated he did have dialysis on Friday. Presented with altered mentation and weakness. Arrives in ICU with BP = 97/45 with MAP = 62 and HR = 48. HD planned for tonight. Bedside nurse contacting nephrology about patient arrival in ICU.   ?eICU Interventions ? Plan: ?Norepinephrine IV infusion via PIV if needed. Titrate to MAP >= 65.   ? ? ? ?Intervention Category ?Evaluation Type: New Patient Evaluation ? ?Edwyna Dangerfield Cornelia Copa ?04/28/2021, 2:45 AM ?

## 2021-04-28 NOTE — Progress Notes (Signed)
eLink Physician-Brief Progress Note ?Patient Name: Zyden Suman ?DOB: 1970-09-14 ?MRN: [id number] ? ? ?Date of Service ? 04/28/2021  ?HPI/Events of Note ? Ionized Ca++ = 1.06. However, his Ca++ X PO4--- product is over 70 (7.1 X 10.9 = 71.71). Will not replace Ca++ at this time.   ?eICU Interventions ? Continue present management.  ? ? ? ?Intervention Category ?Major Interventions: Electrolyte abnormality - evaluation and management ? ?Leslee Haueter Cornelia Copa ?04/28/2021, 5:06 AM ?

## 2021-04-28 NOTE — Progress Notes (Signed)
eLink Physician-Brief Progress Note ?Patient Name: Eddie Davies ?DOB: [birth year] ?MRN: [id number] ? ? ?Date of Service ? 04/28/2021  ?HPI/Events of Note ? ABG on room air = 7.24/42.3/60/18.4. Hypercarbia is not the etiology of his lethargy. Hopefully, his mental status and pH will improve with HD.   ?eICU Interventions ? Continue present management.   ? ? ? ?Intervention Category ?Major Interventions: Other: ? ?Anyi Fels Cornelia Copa ?04/28/2021, 5:03 AM ?

## 2021-04-28 NOTE — Consult Note (Addendum)
Renal Service Consult Note [facility]  Shmuel Kedrowski 04/28/2021 Maree Krabbe, MD Requesting Physician: Dr. Wynona Neat  Reason for Consult: ESRD pt w/ AMS HPI: The patient is a 51 y.o. year-old w/ hx of ESRD on HD, cirrhosis, DM2, Cdif, gout, CHF, HTN who presented to ED yest 4/04 w/ AMS and generalized weakness. Pt had missed HD this week on Monday, did have HD last Friday. In ED pt was hyperkalemic w/ hypotension and bradycardia. Pt was given IV Ca, albuterol, lokelma and insulin w/ D50 for high K+ and admitted to ICU. HD was ordered and completed early this am.  Asked to see for ESRD.    Pt seen in room.  On HD x 3 yrs, MWF, via [facility].  Lives at [facility] for last [facility] yrs. He is WC bound but transfers w/o using hoyer lift. Has chronic diarrhea which can "get in the way" of his dialysis at times. Usually is able to stay for full treatment per the pt.    ROS - denies CP, no joint pain, no HA, no blurry vision, no rash, no diarrhea, no nausea/ vomiting, no dysuria, no difficulty voiding   Past Medical History  Past Medical History:  Diagnosis Date   Anxiety disorder, unspecified    Cirrhosis (HCC) 04/2018   Diabetes mellitus without complication (HCC)    type II   Enterocolitis due to Clostridium difficile, not specified as recurrent 08/09/2018   admission   Epilepsy, unspecified, not intractable, without status epilepticus (HCC)    ESRD (end stage renal disease) (HCC)    MWF    Gout, unspecified    Heart failure, unspecified (HCC)    Hypertension    Klebsiella pneumoniae (k. pneumoniae) as the cause of diseases classified elsewhere 06/25/2018   admission   Liver failure (HCC)    Major depressive disorder, single episode, unspecified    Metabolic acidosis 04/2018   Pneumonia    Polyneuropathy, unspecified    Tobacco use    Vitamin D deficiency    Past Surgical History  Past Surgical History:  Procedure Laterality Date   APPENDECTOMY  1981   AV  FISTULA PLACEMENT Right 10/23/2018   Procedure: ARTERIOVENOUS (AV) FISTULA CREATION RIGHT ARM;  Surgeon: Nada Libman, MD;  Location: [facility] OR;  Service: Vascular;  Laterality: Right;   CHOLECYSTECTOMY     HERNIA REPAIR  2007   IR FLUORO GUIDE CV LINE RIGHT  05/30/2018   IR FLUORO GUIDE CV LINE RIGHT  03/18/2019   IR FLUORO GUIDE CV LINE RIGHT  03/22/2019   IR REMOVAL TUN CV CATH W/O FL  03/15/2019   IR SINUS/FIST TUBE CHK-NON GI  04/04/2019   IR US GUIDE VASC ACCESS RIGHT  05/30/2018   IR US GUIDE VASC ACCESS RIGHT  03/18/2019   IR US GUIDE VASC ACCESS RIGHT  03/22/2019   NEPHRECTOMY Left 2020   Family History  Family History  Problem Relation Age of Onset   Seizures Neg Hx    Social History  reports that he has been smoking cigarettes. He has been smoking an average of 1 pack per day. He has never used smokeless tobacco. He reports that he does not currently use alcohol. He reports that he does not use drugs. Allergies  Allergies  Allergen Reactions   Fish Oil Swelling and Other (See Comments)        Ethyl Acetate    Omega-3-Acid Ethyl Esters    Home medications Prior to Admission medications  Medication Sig Start Date End Date Taking? Authorizing Provider  acetaminophen (TYLENOL) 325 MG tablet Take 650 mg by mouth every 8 (eight) hours as needed for fever (pain).   Yes [provider]  allopurinol (ZYLOPRIM) 100 MG tablet Take 100 mg by mouth daily.  08/01/18  Yes [provider]  Amino Acids-Protein Hydrolys (FEEDING SUPPLEMENT, PRO-STAT 64,) LIQD Take 30 mLs by mouth in the morning and at bedtime.   Yes [provider]  calcium carbonate (TUMS - DOSED IN MG ELEMENTAL CALCIUM) 500 MG chewable tablet Chew 4-5 tablets by mouth See admin instructions. 4 tablets 3 times daily except on Monday,Wednesday and Friday mornings give, 5 tablets on Monday,Wednesday and Friday mornings at 6am 07/19/19  Yes [provider]  carbamazepine (TEGRETOL XR) 100 MG 12 hr  tablet Take 100 mg by mouth 2 (two) times daily. Take with 800 mg dose for a total daily dose of 900 mg twice daily   Yes [provider]  carbamazepine (TEGRETOL XR) 400 MG 12 hr tablet Take 800 mg by mouth 2 (two) times daily. Take with 100 mg dose for a total daily dose of 900 mg twice daily   Yes [provider]  cyclobenzaprine (FLEXERIL) 5 MG tablet Take 5 mg by mouth daily as needed for muscle spasms. 12/02/19  Yes [provider]  Dextrose, Diabetic Use, (GLUCOSE) 77.4 % GEL Take 1 Dose by mouth as needed (BG <70).   Yes [provider]  DULoxetine (CYMBALTA) 20 MG capsule Take 20 mg by mouth daily.   Yes [provider]  gabapentin (NEURONTIN) 100 MG capsule Take 1 capsule (100 mg total) by mouth 3 (three) times daily as needed (neuropathic pain). Patient taking differently: Take 100 mg by mouth 3 (three) times daily. 01/24/21 04/28/21 Yes Demaio, Alexa, MD  Glucagon HCl 1 MG SOLR Inject 1 mg into the muscle as needed (BG <70).   Yes [provider]  insulin aspart (NOVOLOG) 100 UNIT/ML injection Inject 5 Units into the skin 3 (three) times daily with meals.   Yes [provider]  insulin aspart (NOVOLOG) 100 UNIT/ML injection Inject 3-15 Units into the skin See admin instructions. 3 times daily per sliding scale 140-180 = 3 units 181-241 = 4 units  242-300 = 6 units 301-350 = 8 units 351-400 =  12 units 401-450 = 15 units   Yes [provider]  Insulin Glargine (BASAGLAR KWIKPEN) 100 UNIT/ML Inject 25 Units into the skin at bedtime. 08/05/20 04/28/21 Yes Simonne Martinet, NP  Ipratropium-Albuterol (COMBIVENT) 20-100 MCG/ACT AERS respimat Inhale 1 puff into the lungs every 6 (six) hours as needed for wheezing.   Yes [provider]  ketoconazole (NIZORAL) 2 % cream Apply 1 application. topically See admin instructions. Tuesday,Friday,sunday   Yes [provider]  lactulose (CHRONULAC) 10 GM/15ML solution  Take 15 mLs (10 g total) by mouth 2 (two) times daily. Patient taking differently: Take 10 g by mouth 2 (two) times daily. Hold morning dose on dialysis days (Monday's, Wednesday's, and Friday's) 05/31/18  Yes [name], [name] K, MD  lidocaine-prilocaine (EMLA) cream Apply 1 application topically every Monday, Wednesday, and Friday.   Yes [provider]  Multiple Vitamin (MULTIVITAMIN WITH MINERALS) TABS tablet Take 1 tablet by mouth daily.   Yes [provider]  Nutritional Supplements (FEEDING SUPPLEMENT, NEPRO CARB STEADY,) LIQD Take 220 mLs by mouth at bedtime.   Yes [provider]  omeprazole (PRILOSEC) 20 MG capsule Take 20 mg  by mouth daily.  07/04/18  Yes [provider]  PHENobarbital (LUMINAL) 64.8 MG tablet Take 4 tablets (259.2 mg total) by mouth at bedtime. 12/05/18 04/28/21 Yes [place], Eric J, DO  simvastatin (ZOCOR) 20 MG tablet Take 20 mg by mouth at bedtime.    Yes [provider]  tamsulosin (FLOMAX) 0.4 MG CAPS capsule Take 0.4 mg by mouth at bedtime.   Yes [provider]  TRADJENTA 5 MG TABS tablet Take 5 mg by mouth daily. 08/19/19  Yes [provider]  Amino Acids-Protein Hydrolys (FEEDING SUPPLEMENT, PRO-STAT 64,) LIQD Take 30 mLs by mouth in the morning and at bedtime.    [provider]     Vitals:   04/28/21 0645 04/28/21 0700 04/28/21 0730 04/28/21 0742  BP: 111/73 121/73    Pulse: 82 78    Resp: 13 15    Temp:   98.5 F (36.9 C) 98.4 F (36.9 C)  TempSrc:   Oral Oral  SpO2: 98% 99%    Weight:   107.8 kg   Height:       Exam Gen alert, no distress, Ox3 No rash, cyanosis or gangrene Sclera anicteric, throat clear  No jvd or bruits Chest clear bilat to bases, no rales/ wheezing RRR no RG Abd soft ntnd no mass or ascites +bs GU normal ,male MS no joint effusions or deformity Ext no LE or UE edema, no wounds or ulcers Neuro is alert, Ox 3 , nf, no asterixis    RUA aVF + bruit    Home  meds include - novolog/ glargine insulin, cymbalta, prilosec, zocor    OP HD: MWF HD  4h   400/500  101.6kg  2K/3Ca bath  Hep 1500  AVF RUE   - hect 3 ug tiw IV  - no esa, last Hb 11.4 on 3/29   Assessment/ Plan: AMS - uremia and/ or other, missed Monday HD this week. Appears lucid this am post HD.  Hyperkalemia - severe , > 7.5 w/ bradycardia and hypotension. SP temporizing measures and doing better after HD overnight. K 4.2 this am.  ESRD - on HD MWF.  Had HD early this am. Next HD Friday.  BP/ volume - BP's are wnl, weights are up but no vol excess /edema on exam and no resp issues, doubt wts accurate.  Anemia esrd - Hb 10- 12 here, no esa needs.  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  H/o cirrhosis      Rob Arlean Hopping  MD 04/28/2021, 10:28 AM Recent Labs  Lab 04/27/21 2149 04/27/21 2157 04/28/21 0136 04/28/21 0442  HGB  --  11.9* 10.4* 9.9*  ALBUMIN  --   --  2.5*  --   CALCIUM 7.1*  --  7.1*  --   PHOS  --   --  10.9*  --   CREATININE 9.76* 10.40* 9.82*  --   K >7.5* 7.4* 7.2* 4.9

## 2021-04-29 ENCOUNTER — Other Ambulatory Visit: Payer: Self-pay | Admitting: Neurology

## 2021-04-29 LAB — HEPATITIS B SURFACE ANTIBODY, QUANTITATIVE: Hep B S AB Quant (Post): 22.1 m[IU]/mL (ref >9.9)

## 2021-04-29 MED ORDER — PHENOBARBITAL 97.2 MG PO TABS: 194.4000 mg | ORAL_TABLET | Freq: Every day | ORAL | 5 refills | Status: DC

## 2021-04-29 MED ORDER — PHENOBARBITAL 64.8 MG PO TABS
64.8000 mg | ORAL_TABLET | Freq: Every day | ORAL | 0 refills | Status: DC
Start: 2021-04-29 — End: 2023-01-23

## 2021-04-29 NOTE — Addendum Note (Signed)
Addended by: Sarina Ill B on: 04/29/2021 05:36 PM ? ? Modules accepted: Orders ? ?

## 2021-04-29 NOTE — Telephone Encounter (Addendum)
Spoke with Dr [name]. We will change Rx to 64.8 mg x 1 and 97.2 mg x 2 daily at bedtime. Total daily dose will be 259.2 mg at bedtime which is unchanged from current.  ? ?I called [facility] and left a voicemail for the nurses on hall [address] explaining the changes to his prescription and asked for a call back if needed.  I was then able to speak with pt's nurse Marchia Bond to let him know the plan noted above. He verbalized understanding and appreciation and he asked for a copy of the prescriptions to be faxed to him.  ?

## 2021-04-29 NOTE — Addendum Note (Signed)
Addended by: Gildardo Griffes on: 04/29/2021 05:18 PM ? ? Modules accepted: Orders ? ?

## 2021-04-29 NOTE — Progress Notes (Signed)
Patient left with PTAR at this time.  ?

## 2021-05-03 NOTE — Telephone Encounter (Signed)
I emailed the manager, Juliene Pina, a copy of the prescriptions for Phenobarbital as their fax machine wasn't accepting a fax again this morning. [email or web address]  ?

## 2021-05-18 DIAGNOSIS — G40909 Epilepsy, unspecified, not intractable, without status epilepticus: Secondary | ICD-10-CM | POA: Diagnosis not present

## 2021-05-21 DIAGNOSIS — G40909 Epilepsy, unspecified, not intractable, without status epilepticus: Secondary | ICD-10-CM | POA: Diagnosis not present

## 2021-05-25 ENCOUNTER — Ambulatory Visit: Payer: Medicare Other | Admitting: Cardiology

## 2021-06-08 ENCOUNTER — Other Ambulatory Visit: Payer: Self-pay | Admitting: Neurology

## 2021-06-08 ENCOUNTER — Encounter: Payer: Self-pay | Admitting: Neurology

## 2021-06-08 DIAGNOSIS — G40909 Epilepsy, unspecified, not intractable, without status epilepticus: Secondary | ICD-10-CM

## 2021-06-08 NOTE — Procedures (Signed)
? ?Clinical History : This is a 51 year old M PMH ESRD on HD, Type 2 diabetes, HTN, heart failure, OSA, epilepsy who presents with episodes of 'mini seizures.  ? ?INTERMITTENT MONITORING with VIDEO TECHNICAL SUMMARY:  ?This AVEEG was performed using equipment provided by [employer] utilizing Bluetooth (Trackit) amplifiers with continuous EEGT attended video collection using encrypted remote transmission via [place] secured cellular tower network with data rates for each AVEEG performed. This is a Biomedical engineer AVEEG, obtained, according to the 10-20 international electrode placement system, reformatted digitally into referential and bipolar montages. Data was acquired with a minimum of 21 bipolar connections and sampled at a minimum rate of 250 cycles per second per channel, maximum rate of 450 cycles per second per channel and two channels for EKG. The entire VEEG study was recorded through cable and or radio telemetry for subsequent analysis. Specified epochs of the AVEEG data were identified at the direction of the subject by the depression of a push button by the patient. Each patients event file included data acquired two minutes prior to the push button activation and continuing until two minutes afterwards. AVEEG files were reviewed on [place], Licensed Software provided by [employer] with a digital high frequency filter set at 70 Hz and a low frequency filter set at 1 Hz with a paper speed of 40m/s resulting in 10 seconds per digital page. This entire AVEEG was reviewed by the EEG Technologist. Random time samples, random sleep samples, clips, patient initiated push button files with included patient daily diary logs, EEG Technologist pruned data was reviewed and verified for accuracy and validity by the governing reading neurologist in full details. This AEEGV was fully compliant with all requirements for CPT 97500 for setup, patient education, take down and  administered by an EEG technologist.  ? ?Long-Term EEG with Video was monitored intermittently by a qualified EEG technologist for the entirety of the recording; quality check-ins were performed at a minimum of every two hours, checking and documenting real-time data and video to assure the integrity and quality of the recording (e.g., camera position, electrode integrity and impedance), and identify the need for maintenance. For intermittent monitoring, an EEG Technologist monitored no more than 12 patients concurrently. Diagnostic video was captured at least 80% of the time during the recording.  ? ?PATIENT EVENTS: There were no patient events noted or captured during this recording.  ? ?TECHNOLOGIST EVENTS: Notes for focal slowing with sharp contoured waves were seen in the right frontal head region detected by the reviewing neurodiagnostic technologist during the recording for further evaluation.  ? ?TIME SAMPLES: 10-minutes of every 2 hours recorded are reviewed as random time samples.  ? ?SLEEP SAMPLES: 5-minutes of every 24 hour recorded sleep cycle are reviewed as random sleep samples.  ? ?AWAKE: At maximal level of alertness, the posterior dominant background activity was continuous, reactive, low voltage rhythm of 7-8 Hz. This was symmetric, well-modulated, and attenuated with eye opening. Diffuse, symmetric, frontocentral beta range activity was present.  ? ?SLEEP: N1 Sleep (Stage 1) was observed and characterized by the disappearance of alpha rhythm and the appearance of vertex activity.  ?N2 Sleep (Stage 2) was observed and characterized by vertex waves, K-complexes, and sleep spindles.  ?N3 (Stage 3) sleep was observed and characterized by high amplitude Delta activity of 20%. REM sleep was observed.  ? ?EKG: There were no arrhythmias or abnormalities noted during this recording.  ? ? ?Physician Interpretation: This is an abnormal 72 hrs ambulatory  video EEG due to presence of mild diffuse slowing and  right frontal slowing. Right frontal slowing is consistent with an area of neuronal dysfunction within the right frontal region and diffuse slowing is consistent with a generalized brain dysfunction such as in encephalopathy, nonspecific. ? ?Alric Ran, MD ?[facility] ?  ?

## 2021-06-08 NOTE — Procedures (Signed)
? ? ?  CLINICAL HISTORY: This is a 51 year old M PMH ESRD on HD, Type 2 diabetes, HTN, heart failure, OSA, epilepsy who presents with episodes of 'mini seizures. ? ?MEDICATIONS: gabapentin, Zocor, Flomax, lidocaine, novolog, cymbalta, tegretol, tylenol  ? ?TECHNICAL SUMMARY: Electrodes are applied using the standard 10-20 international placement protocol. This ambulatory EEG recording was performed utilizing 23 electrode inputs including: (19) cephalic, (1) ground, (1) system reference, and (2) EKG. The EEG recording can be visualized in all standard types of montages for review. The recordings are done at a sampling rate of 200 samples per second, per channel, allowing for relatively high frequency responses of 70 Hz. EEG playbacks are done with digital high frequency filters noted at the top of the page. The EEG is notated with patient events at the direction of the patient by pressing a push button mounted on a waist worn EEG recording device. This EEG was also recorded using high definition digital video.  ? ?TECHNOLOGIST NOTE: No skull defect or scalp edemas were present.  ? ?BACKGROUND: The record was obtained in the awake and drowsy state. No sleep was able to be captured. The posterior awake dominant background activity was a continuous, reactive rhythm of 7-8 Hz. This was symmetric and well-modulated and attenuated with eye opening. Symmetric diffuse frontocentral beta range activity was present.  ? ?SIGNIFICANT FINDINGS: There were notes for mild background slowing. There were no apparent electrographic or clinical seizures noted by the reviewing neurodiagnostic technologist.  ? ?PATIENT EVENTS: There were no patient events noted or captured during this recording.  ? ?HYPERVENTILATION: Hyperventilation was not performed due to COVID-19 contraindications.  ? ?PHOTIC STIMULATION: IPS Was not performed.  ? ?EKG: There were no arrhythmias or abnormalities noted during this recording.  ? ?EEG CONCLUSION: This is  an abnormal routine EEG due to mild diffuse slowing which is consistent with a generalized brain dysfunction such as in encephalopathy, nonspecific. ? ? ?Alric Ran, MD ?[facility] ? ? ?

## 2021-06-09 ENCOUNTER — Telehealth: Payer: Self-pay

## 2021-06-09 NOTE — Telephone Encounter (Signed)
-----   Message from Anda Latina, RN sent at 06/09/2021  1:26 PM EDT ----- ? ?----- Message ----- ?From: Melvenia Beam, MD ?Sent: 06/09/2021   1:04 PM EDT ?To: Gna-Pod 4 Results ? ?There was no epileptiform or seizure activity on this EEG. Whatever episoes he is having do not appear to be seizure activity. thanks ? ?

## 2021-06-09 NOTE — Telephone Encounter (Signed)
I called the pt and left a vm ( ok per dpr on his direct extension and [place] [place] ([phone])).  ? ?Pt was advised to CB if he had any questions/concerns.  ?

## 2021-07-08 ENCOUNTER — Encounter: Payer: Self-pay | Admitting: Cardiology

## 2021-07-08 ENCOUNTER — Ambulatory Visit (INDEPENDENT_AMBULATORY_CARE_PROVIDER_SITE_OTHER): Payer: Medicare Other | Admitting: Cardiology

## 2021-07-08 VITALS — BP 108/62 | HR 72 | Ht 74.0 in | Wt 237.4 lb

## 2021-07-08 DIAGNOSIS — E782 Mixed hyperlipidemia: Secondary | ICD-10-CM | POA: Diagnosis not present

## 2021-07-08 DIAGNOSIS — E088 Diabetes mellitus due to underlying condition with unspecified complications: Secondary | ICD-10-CM | POA: Diagnosis not present

## 2021-07-08 DIAGNOSIS — R0789 Other chest pain: Secondary | ICD-10-CM

## 2021-07-08 DIAGNOSIS — F17210 Nicotine dependence, cigarettes, uncomplicated: Secondary | ICD-10-CM | POA: Diagnosis not present

## 2021-07-08 DIAGNOSIS — Z992 Dependence on renal dialysis: Secondary | ICD-10-CM

## 2021-07-08 DIAGNOSIS — N186 End stage renal disease: Secondary | ICD-10-CM

## 2021-07-08 NOTE — Patient Instructions (Signed)
Medication Instructions:  Your physician recommends that you continue on your current medications as directed. Please refer to the Current Medication list given to you today.  *If you need a refill on your cardiac medications before your next appointment, please call your pharmacy*   Lab Work: None ordered If you have labs (blood work) drawn today and your tests are completely normal, you will receive your results only by: [place] (if you have MyChart) OR A paper copy in the mail If you have any lab test that is abnormal or we need to change your treatment, we will call you to review the results.   Testing/Procedures: None ordered   Follow-Up: At [facility], you and your health needs are our priority.  As part of our continuing mission to provide you with exceptional heart care, we have created designated Provider Care Teams.  These Care Teams include your primary Cardiologist (physician) and Advanced Practice Providers (APPs -  Physician Assistants and Nurse Practitioners) who all work together to provide you with the care you need, when you need it.  We recommend signing up for the patient portal called "MyChart".  Sign up information is provided on this After Visit Summary.  MyChart is used to connect with patients for Virtual Visits (Telemedicine).  Patients are able to view lab/test results, encounter notes, upcoming appointments, etc.  Non-urgent messages can be sent to your provider as well.   To learn more about what you can do with MyChart, go to [email or web address].    Your next appointment:   As needed   The format for your next appointment:   In Person  Provider:   Jyl Heinz, MD   Other Instructions NA

## 2021-07-08 NOTE — Progress Notes (Signed)
Cardiology Office Note:    Date:  07/08/2021   ID:  Eddie Davies, DOB 02-16-1970, MRN [id number]  PCP:  [name], [name]  Cardiologist:  Jenean Lindau, MD   Referring MD: Practice, Darel Hong*    ASSESSMENT:    1. Other chest pain   2. ESRD (end stage renal disease) on dialysis ([place])   3. Mixed hyperlipidemia   4. Diabetes mellitus due to underlying condition with unspecified complications ([place])    PLAN:    In order of problems listed above:  Primary prevention stressed with the patient.  Importance of compliance with diet medication stressed and vocalized understanding. Chest pain: Resolved at this time.  Stress test negative and discussed with the patient at length. Cigarette smoker: I spent 5 minutes with the patient discussing solely about smoking. Smoking cessation was counseled. I suggested to the patient also different medications and pharmacological interventions. Patient is keen to try stopping on its own at this time. He will get back to me if he needs any further assistance in this matter. Diabetes mellitus and dyslipidemia: Managed by primary care.  Diet emphasized.  Weight reduction stressed. End-stage renal disease on dialysis Patient's questions were answered to his satisfaction and he will be seen in follow-up appointment on a as needed basis only.   Medication Adjustments/Labs and Tests Ordered: Current medicines are reviewed at length with the patient today.  Concerns regarding medicines are outlined above.  No orders of the defined types were placed in this encounter.  No orders of the defined types were placed in this encounter.    Chief Complaint  Patient presents with   Follow-up     History of Present Illness:    Eddie Davies is a 51 y.o. male.  Patient has past medical history of end-stage renal failure on dialysis, dyslipidemia and diabetes mellitus.  He is brought in by his attendant.  He lives in assisted  living facility.  He denies any chest pain orthopnea or PND.  He is previously unknown to me.  He is seen by my partner who has since moved to [place].  At the time of my evaluation, the patient is alert awake oriented and in no distress.  Unfortunately continues to smoke.  Past Medical History:  Diagnosis Date   Abscess of scrotum 09/19/2014   Acute encephalopathy    Allergy, unspecified, initial encounter 04/06/2019   Altered mental status, unspecified 04/21/2018   Anemia 12/08/2016   Formatting of this note might be different from the original. Last Assessment & Plan:  Multifactorial chronic with DDx including iron deficiency, CKD, acute blood loss from self discontinuation of HD catheter.  Noted pancytopenia.  No gross blood loss in stool noticed, FOBT neg.  Transfused 1unit PRBC 2/20. - Continue oral iron - Consulted hematology-recs appreciated etiology likely hepatosplenome   Anemia due to chronic kidney disease 06/18/2018   Anemia due to chronic kidney disease, on chronic dialysis ([place]) 04/07/2018   Anemia in chronic kidney disease 08/28/2018   Anemia in ESRD (end-stage renal disease) ([place]) 06/18/2018   Anxiety disorder, unspecified    Axillary abscess 02/03/2014   Bacteremia due to Klebsiella pneumoniae 06/19/2018   Benign essential HTN 05/24/2018   Benign hypertension with chronic kidney disease, stage III ([place]) 06/22/2016   Benign prostatic hyperplasia without lower urinary tract symptoms 05/24/2018   Bradycardia    Cellulitis of toe of right foot 09/19/2014   Chest pain, unspecified 01/27/2020   Cirrhosis ([place]) 04/2018  Closed fracture of left upper extremity with routine healing 03/20/2018   Last Assessment & Plan:  Formatting of this note might be different from the original. Unclear etiology.   --Ortho consulted on admission s/p removal of cast and application of volar/dorsal splint  --Has had arranged follow-up with Emelda Brothers NP will need to continue to follow-up for removal of cast    Closed intertrochanteric fracture of left femur ([place]) 01/21/2018   Coagulopathy ([place]) 02/06/2019   COVID-19 virus infection 01/09/2019   Cutaneous abscess of buttock    Delirium 03/14/2018   Last Assessment & Plan:  Formatting of this note might be different from the original. Multifactorial-combination of AKI, hypercarbia, non-cirrhotic hyperammonemia, hospital-acquired delirium-resolved.  Mental status now appears to be at baseline.  CT head- no acute findings: Generalized cerebral volume loss is advanced for patient's age, possible otomastoiditis correlate clinically(patient denies   Diabetes mellitus without complication ([place])    type II   Diabetic ketoacidosis ([place]) 12/08/2016   Diabetic ulcer of toe associated with type 2 diabetes mellitus, limited to breakdown of skin ([place]) 02/14/2017   Diarrhea, unspecified 06/08/2018   Difficult intravenous access 03/16/2018   Last Assessment & Plan:  Formatting of this note might be different from the original. TLC 2/21 Consent obtained from patient and his cousin Mosie Lukes   DKA (diabetic ketoacidosis) ([place]) 08/04/2020   Dyspnea, unspecified 06/01/2018   Encounter for removal of sutures 09/15/2020   Encounter for screening for respiratory tuberculosis 06/01/2018   End stage liver disease ([place]) 01/09/2019   Enterocolitis due to Clostridium difficile, not specified as recurrent 08/09/2018   admission   Epilepsy, unspecified, not intractable, without status epilepticus ([place])    ESRD (end stage renal disease) on dialysis ([place])    MWF    ESRD on hemodialysis ([place]) 12/27/2018   Failure to thrive in adult 01/21/2019   Fall 01/21/2018   Fever 06/18/2018   Fracture of left tibia 01/21/2018   Fracture of left ulna 01/21/2018   GERD (gastroesophageal reflux disease) 12/08/2016   Gout, unspecified    Gram negative sepsis ([place]) 12/27/2018   HCAP (healthcare-associated pneumonia) 04/21/2018   Heart failure, unspecified ([place])    History of Clostridioides difficile  colitis 12/27/2018   HTN (hypertension) 03/08/2018   Formatting of this note might be different from the original. Last Assessment & Plan:  Patient on several meds as outpatient Cont hydralazine, low-dose amlodipine titrate as required Cont to monitor bp Last Assessment & Plan:  Formatting of this note might be different from the original. 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 pneumoniae) as the cause of diseases classified elsewhere 06/25/2018   admission   Leukocytosis 06/18/2018   Liver failure ([place])    Major depressive disorder, single episode, unspecified    Metabolic acidosis 77/4128   Metabolic bone disease 7/86/7672   Open wound of left foot except toes with complication 09/47/0962   Other cirrhosis of liver ([place]) 06/01/2018   Other fluid overload 04/10/2019   Other hypoglycemia 03/08/2018   type II Formatting of this note might be different from the original. Last Assessment & Plan:  Hemoglobin A1c 8 Carb controlled diet Sliding scale insulin, lantus 15 units nightly upon discharge up titrate as needed Medium carbohydrate diet at facility Fingerstick monitoring Last Assessment &  Plan:  Formatting of this note might be different from the original. Hemoglobin A1c 8 Carb controlled diet   Pain, unspecified 07/09/2018   Personal history of COVID-19 02/25/2019   Physical deconditioning 03/08/2018   Last Assessment & Plan:  Formatting of this note might be different from the original. -Patient is resident at [facility]. -PT/OT consulted will need continue PT services at facility  -ST consult Formatting of this note might be different from the original. Last Assessment & Plan:  -Patient is resident at [facility]. -PT/OT consulted will need continue PT services at facility  -ST  consult   Pneumonia    Polyneuropathy, unspecified    Postprocedural hematoma of a genitourinary system organ or structure following a genitourinary system procedure 02/14/2019   Pressure injury of lower back, stage 2 ([place]) 03/08/2018   Last Assessment & Plan:  Formatting of this note might be different from the original. No discharge, clean appearing wound Continue routine wound care Formatting of this note might be different from the original. Last Assessment & Plan:  No discharge, clean appearing wound Continue routine wound care   Pressure injury of skin 04/21/2018   Protein-calorie malnutrition, severe ([place]) 01/21/2019   Respiratory failure ([place])    Retroperitoneal abscess ([place]) 03/13/2019   Secondary hyperparathyroidism of renal origin ([place]) 08/28/2018   Seizures ([place]) 05/24/2018   Sepsis ([place]) 05/24/2018   Septic shock ([place]) 01/09/2019   Thrombocytopenia ([place]) 05/24/2018   Tobacco use    Trauma 01/21/2018   Type 2 diabetes mellitus with unspecified complications ([place]) 0/27/7412   Urinary calculus, unspecified 03/08/2018   Last Assessment & Plan:  Formatting of this note might be different from the original. Resolved  creatinine 7.3. on admission Most likely ATN-urine output now appears to stabilized Baseline  2.5-3 in June of 2019 according to OSH records No recent baseline available Nephro consulted patient started on HD via perm cath which he pulled 2/19 Cr improved Cont flomax renaly dose all meds Monitor cr and   Vitamin D deficiency     Past Surgical History:  Procedure Laterality Date   APPENDECTOMY  1981   AV FISTULA PLACEMENT Right 10/23/2018   Procedure: ARTERIOVENOUS (AV) FISTULA CREATION RIGHT ARM;  Surgeon: [name], MD;  Location: [facility];  Service: Vascular;  Laterality: Right;   CHOLECYSTECTOMY     HERNIA REPAIR  2007   IR FLUORO GUIDE CV LINE RIGHT  05/30/2018   IR FLUORO GUIDE CV LINE RIGHT  03/18/2019   IR FLUORO GUIDE CV LINE RIGHT  03/22/2019   IR REMOVAL TUN CV  CATH W/O FL  03/15/2019   IR SINUS/FIST TUBE CHK-NON GI  04/04/2019   IR US GUIDE VASC ACCESS RIGHT  05/30/2018   IR US GUIDE VASC ACCESS RIGHT  03/18/2019   IR US GUIDE VASC ACCESS RIGHT  03/22/2019   NEPHRECTOMY Left 2020    Current Medications: Current Meds  Medication Sig   acetaminophen (TYLENOL) 325 MG tablet Take 650 mg by mouth every 8 (eight) hours as needed for fever (pain).   allopurinol (ZYLOPRIM) 100 MG tablet Take 100 mg by mouth daily.    Calcium Carbonate Antacid (TUMS PO) Take 2 tablets by mouth daily before breakfast.   carbamazepine (TEGRETOL XR) 100 MG 12 hr tablet Take 100 mg by mouth 2 (two) times daily. Take with 800 mg dose for a total daily dose of 900 mg twice daily   carbamazepine (TEGRETOL XR) 400 MG 12 hr tablet Take 800 mg by mouth  2 (two) times daily. Take with 100 mg dose for a total daily dose of 900 mg twice daily   DULoxetine (CYMBALTA) 20 MG capsule Take 20 mg by mouth daily.   insulin aspart (NOVOLOG) 100 UNIT/ML injection Inject 8 Units into the skin 3 (three) times daily with meals.   insulin aspart (NOVOLOG) 100 UNIT/ML injection Inject 3-15 Units into the skin See admin instructions. 3 times daily per sliding scale 140-180 = 3 units 181-241 = 4 units  242-300 = 6 units 301-350 = 8 units 351-400 =  12 units 401-450 = 15 units   Insulin Glargine (BASAGLAR KWIKPEN) 100 UNIT/ML Inject 25 Units into the skin at bedtime. (Patient taking differently: Inject 30 Units into the skin at bedtime.)   Ipratropium-Albuterol (COMBIVENT) 20-100 MCG/ACT AERS respimat Inhale 1 puff into the lungs every 6 (six) hours as needed for wheezing.   lactulose (CHRONULAC) 10 GM/15ML solution Take 15 mLs by mouth 2 (two) times daily.   Multiple Vitamin (MULTIVITAMIN WITH MINERALS) TABS tablet Take 1 tablet by mouth daily.   omeprazole (PRILOSEC) 20 MG capsule Take 20 mg by mouth daily.    PHENobarbital (LUMINAL) 64.8 MG tablet Take 1 tablet (64.8 mg total) by mouth at bedtime.  Take with two of the 97.2 mg tablets for total dose of 259.2 mg. (Patient taking differently: Take 4 tablets by mouth at bedtime. Take with two of the 97.2 mg tablets for total dose of 259.2 mg.)   simvastatin (ZOCOR) 20 MG tablet Take 20 mg by mouth at bedtime.    tamsulosin (FLOMAX) 0.4 MG CAPS capsule Take 0.4 mg by mouth at bedtime.   TRADJENTA 5 MG TABS tablet Take 5 mg by mouth daily.   [DISCONTINUED] PHENobarbital (LUMINAL) 97.2 MG tablet Take 2 tablets (194.4 mg total) by mouth at bedtime. Take with 64.8 mg tablet for total dose of 259.2 mg at bedtime.     Allergies:   Fish oil, Ethyl acetate, and Omega-3-acid ethyl esters   Social History   Socioeconomic History   Marital status: Single    Spouse name: Not on file   Number of children: 0   Years of education: Not on file   Highest education level: High school graduate  Occupational History   Not on file  Tobacco Use   Smoking status: Every Day    Packs/day: 1.00    Types: Cigarettes   Smokeless tobacco: Never  Vaping Use   Vaping Use: Never used  Substance and Sexual Activity   Alcohol use: Not Currently   Drug use: Never   Sexual activity: Not on file  Other Topics Concern   Not on file  Social History Narrative   Lives at [facility]   Left handed   Caffeine: "none hardly"   Social Determinants of Health   Financial Resource Strain: Not on file  Food Insecurity: Not on file  Transportation Needs: Not on file  Physical Activity: Not on file  Stress: Not on file  Social Connections: Not on file     Family History: The patient's family history is negative for Seizures.  ROS:   Please see the history of present illness.    All other systems reviewed and are negative.  EKGs/Labs/Other Studies Reviewed:    The following studies were reviewed today: Results of stress test discussed with the patient at length   Recent Labs: 04/28/2021: ALT 13; BUN 27; Creatinine, Ser 6.23; Hemoglobin 9.9;  Magnesium 2.5; Platelets 91; Potassium 4.2; Sodium 138  Recent Lipid Panel    Component Value Date/Time   CHOL 76 05/24/2018 0528   TRIG 123 05/24/2018 0528   HDL 35 (L) 05/24/2018 0528   CHOLHDL 2.2 05/24/2018 0528   VLDL 25 05/24/2018 0528   LDLCALC 16 05/24/2018 0528    Physical Exam:    VS:  BP 108/62 (BP Location: Left Arm, Patient Position: Sitting)   Pulse 72   Ht 6' 2"  (1.88 m)   Wt 237 lb 6.4 oz (107.7 kg)   SpO2 97%   BMI 30.48 kg/m     Wt Readings from Last 3 Encounters:  07/08/21 237 lb 6.4 oz (107.7 kg)  04/28/21 237 lb 10.5 oz (107.8 kg)  04/13/21 222 lb 10.6 oz (101 kg)     GEN: Patient is in no acute distress HEENT: Normal NECK: No JVD; No carotid bruits LYMPHATICS: No lymphadenopathy CARDIAC: Hear sounds regular, 2/6 systolic murmur at the apex. RESPIRATORY:  Clear to auscultation without rales, wheezing or rhonchi  ABDOMEN: Soft, non-tender, non-distended MUSCULOSKELETAL:  No edema; No deformity  SKIN: Warm and dry NEUROLOGIC:  Alert and oriented x 3 PSYCHIATRIC:  Normal affect   Signed, Jenean Lindau, MD  07/08/2021 3:48 PM    [facility]

## 2021-07-22 ENCOUNTER — Ambulatory Visit (INDEPENDENT_AMBULATORY_CARE_PROVIDER_SITE_OTHER): Payer: Medicare Other | Admitting: Neurology

## 2021-07-22 ENCOUNTER — Encounter: Payer: Self-pay | Admitting: Neurology

## 2021-07-22 VITALS — BP 78/50 | HR 77 | Ht 74.0 in | Wt 224.2 lb

## 2021-07-22 DIAGNOSIS — Z79899 Other long term (current) drug therapy: Secondary | ICD-10-CM

## 2021-07-22 DIAGNOSIS — G40909 Epilepsy, unspecified, not intractable, without status epilepticus: Secondary | ICD-10-CM | POA: Diagnosis not present

## 2021-07-22 NOTE — Patient Instructions (Signed)
Check phenobarb level. If low we can Increase Phenobarb Bone Density Scan - if you have osteoporosis recommend changing from phenobarb to other AED If osteoporosis/osteopenia discuss with [place] [facility] or [name] treatment

## 2021-07-22 NOTE — Progress Notes (Signed)
[facility]    Provider:  Dr [name] Requesting Provider: [name], MD Primary Care Provider:  [name], [name]  CC:  Hx of epilepsy  Addendum 07/28/2021: discussed with epilepsy colleagues. - Patient's phenobarb level is 7. Trileptal is in therapeutic range. Both phenobarb and trileptal are inducers which is why I think his levels are so low despire being on high doses of both(has been on these meds for decades). At this time due to his breakthrough seizures I would like to add Keppra: Take 534m(1 tab) twice daily. Take 1/2 tablet (2557m after dialysis M/W/F. Once he is not having breakthrough seizures we will start tapering his phenobarb by 50% monthly. We will have to see patient monthly and watch the trileptal levels to ensure he doesn't become toxic as we decrease phenobarb (he is on 90079mid trileptal) and trileptal may need to be adjusted as we titrate off of the phenobarb. Please call and explain to facility and schedule a follow up with Dr. AheJaynee Eagles 30 days thanks  07/22/2021: Routine EEG did not show epileptiform activity. 72 hour eeg did not have any event but he did not have his normal seizure-like event while on the EEG. He was diagnosed with osteoporosis but declined treatment. We discussed his phenobarb today, it has been very low. Last was at 7. Given the long-term side effects of phenobarb we discussed stopping it and changing to another agent. He is on Tegretol, I would stop phenobarb(his levels are so low I do not think we need a taper) and start   Patient complains of symptoms per HPI as well as the following symptoms: seizures . Pertinent negatives and positives per HPI. All others negative   Interval history 04/13/2021: [age]o. male here as requested by McgDamaris Hippor seizures since the are of 9 months. PMHx ESRD on HD, ESBL nephrectomy, type 2 diabetes, hypertension, heart failure, urea cycle metabolism disorder, noncompliance, obstructive  sleep apnea, epilepsy, morbid severe obesity with hypoventilation, muscle wasting and atrophy, polyneuropathy, abnormalities of gait and mobility, hyperlipidemia, depression, anxiety, gout, tobacco use with recent admission for retroperitoneal abscess. Last time I saw him, he was stable an dhad been for many years ont he same medications so we did not change.    He says he is having repeated "seizures",  everything gets slow and he has a hot flash for 30 seconds. He remembers the whole things and have a conversation. He is having them 1-2 days a week and can have them 12 times in a day. He used to have "grand mal" seizures but he calls these "mini seizures".  He says he misses his medications and has jerking on the right side, he is here with a representative from his nursing facility. He says these are seizures, he used to have generalized tonic-clonic but he is having these events regularly. The semiology sounds unusual, we discussed getting levels and getting a 72 hour eeg. Last time I saw him, he was stable an dhad been for many years ont he same medications so we did not change. Blood work today, We will send a team to hook you up for a 3-day eeg.  Patient complains of symptoms per HPI as well as the following symptoms: "mini seizures" . Pertinent negatives and positives per HPI. All others negative  CT brain 2012:  DIAGNOSIS:  Right-sided jerking   TECHNIQUE:  Axial 5 mm scans from base to vertex without intravenous contrast.   COMPARISON: None   INTERPRETATION:  Moderate atrophy for age. Negative for mass or mass effect. Negative for  extra-axial fluid collection. Negative for evidence of hemorrhage.  Changes of ethmoid sinus disease are identified bilaterally.   CONCLUSION:  Moderate atrophy for age. Changes of ethmoid sinus disease.    VGL  ___________________________________________________________  Read bySherryll Burger on May 20 2010 12:39P  [facility]   Transcribed byPatrici Ranks on May 20 2010 12:39P  Signed by:  [name] on:  May 21 2010  1:08P Procedure Note  [name], MD - 07/10/2012  Formatting of this note might be different from the original.  INDICATIONS:    OTH: RT SIDE JERKING  COMMENTS:     ED 10   PROCEDURE:   DCT 0007 - CT BRAIN/HEAD WO CONT  - May 20 2010    DIAGNOSIS:  Right-sided jerking   TECHNIQUE:  Axial 5 mm scans from base to vertex without intravenous contrast.   COMPARISON: None   INTERPRETATION:  Moderate atrophy for age. Negative for mass or mass effect. Negative for  extra-axial fluid collection. Negative for evidence of hemorrhage.  Changes of ethmoid sinus disease are identified bilaterally.   CONCLUSION:  Moderate atrophy for age. Changes of ethmoid sinus disease.    HPI 04/18/2019:  Eddie Davies is a 51 y.o. male here as requested by [name] MD for seizures. PMHx ESRD on HD, ESBL nephrectomy, type 2 diabetes, hypertension, heart failure, urea cycle metabolism disorder, noncompliance, obstructive sleep apnea, epilepsy, morbid severe obesity with hypoventilation, muscle wasting and atrophy, polyneuropathy, abnormalities of gait and mobility, hyperlipidemia, depression, anxiety, gout, tobacco use with recent admission for retroperitoneal abscess.  I reviewed Dr. Frutoso Schatz notes, there is no documentation on history of seizures in her documentation.  Patient had an EEG in March 2020 with showed a diffuse disturbance no epileptiform activity was noted(reviewed report).  I do not see any other notes from neurology in epic or in care everywhere.  He is on carbamazepine and phenobarbital but I cannot find any  history of seizures.  He has had seizures since the age of 8 when he was diagnosed with epilepsy after reported head injury, he states he have never lost consciousness, he says he remembers them all, he is a poor historian, he feels the medications work for him. He has been on  the same medications for years, he is stable. He says his medications are given to him by staff. He says his seizures are everything goes in slow motion for 30 seconds at the most but no loss of consciousness or falls or generalized activity of clonic tonic movements. E does not have anymore information, again a poor historian.   Reviewed notes, labs and imaging from outside physicians, which showed:  02/20/2019: Carbamazepine level 5.7 (8-12) Phenobarb level 9.5 (15-40)  Reviewed CT Scan head report 03/14/2018:   1. Mild image degradation due to patient motion. Within this technical constraint, no acute hemorrhage or evidence for acute large vessel territory ischemia. Although no evidence of acute infarction, mass, or hemorrhage is seen, CT is relatively insensitive for the detection of hypoxia/ischemia within the first 24-48 hours, and an MRI scan may be indicated.  2. Suggestion of asymmetrically small left cerebral hemisphere that is likely long-standing given that there is associated expansion of the diploic space of the left calvarium, particularly the parietal and occipital calvarium.  3. Asymmetric expansion of extra-axial CSF spaces along the left frontal and parietal convexity could  be due to enlarged subarachnoid spaces or potentially subdural hygroma. This exerts no substantial mass effect.  4. Generalized cerebral volume loss is advanced for patient's age.  5. Near complete opacification of right mastoid air cells with trabecular thickening and sclerosis suggesting at least a component of chronic inflammation. There are fluid levels in scattered left mastoid air cells without coalescence. While nonspecific, this should be correlated for signs/symptoms of otomastoiditis.  CT Head 01/22/2018:  .  Brain: No evidence of acute large vascular territory infarct.  No mass effect, mass lesion, or hydrocephalus. No acute hemorrhage. Subtle areas of linear cortical hyperdensity in the left  temporal lobe are favored to the secondary to areas of cortical necrosis associated with parenchymal volume loss.  CT of the head 05/21/2010:  INTERPRETATION: Moderate atrophy for age. Negative for mass or mass effect. Negative for  extra-axial fluid collection. Negative for evidence of hemorrhage.  Changes of ethmoid sinus disease are identified bilaterally.   CONCLUSION: Moderate atrophy for age. Changes of ethmoid sinus disease.  CT Head 09/2009: CONCLUSION: Asymmetric left supratentorial parenchymal atrophy versus a left convexity hygroma.  Given the patient's age and presentation, recommend MRI of the brain to further characterize.     Review of Systems: Patient complains of symptoms per HPI as well as the following symptoms: weakness, seizures,numbness. Pertinent negatives and positives per HPI. All others negative.   Social History   Socioeconomic History   Marital status: Single    Spouse name: Not on file   Number of children: 0   Years of education: Not on file   Highest education level: High school graduate  Occupational History   Not on file  Tobacco Use   Smoking status: Every Day    Packs/day: 1.00    Types: Cigarettes   Smokeless tobacco: Never  Vaping Use   Vaping Use: Never used  Substance and Sexual Activity   Alcohol use: Not Currently   Drug use: Never   Sexual activity: Not on file  Other Topics Concern   Not on file  Social History Narrative   [facility]ives at [facility]   Left handed   Caffeine: "none hardly"   Social Determinants of Health   Financial Resource Strain: Not on file  Food Insecurity: Not on file  Transportation Needs: Not on file  Physical Activity: Not on file  Stress: Not on file  Social Connections: Not on file  Intimate Partner Violence: Not on file    Family History  Problem Relation Age of Onset   Seizures Neg Hx     Past Medical History:  Diagnosis Date   Abscess of scrotum 09/19/2014   Acute encephalopathy     Allergy, unspecified, initial encounter 04/06/2019   Altered mental status, unspecified 04/21/2018   Anemia 12/08/2016   Formatting of this note might be different from the original. Last Assessment & Plan:  Multifactorial chronic with DDx including iron deficiency, CKD, acute blood loss from self discontinuation of HD catheter.  Noted pancytopenia.  No gross blood loss in stool noticed, FOBT neg.  Transfused 1unit PRBC 2/20. - Continue oral iron - Consulted hematology-recs appreciated etiology likely hepatosplenome   Anemia due to chronic kidney disease 06/18/2018   Anemia due to chronic kidney disease, on chronic dialysis ([place]) 04/07/2018   Anemia in chronic kidney disease 08/28/2018   Anemia in ESRD (end-stage renal disease) ([place]) 06/18/2018   Anxiety disorder, unspecified    Axillary abscess 02/03/2014   Bacteremia due to Klebsiella pneumoniae 06/19/2018  Benign essential HTN 05/24/2018   Benign hypertension with chronic kidney disease, stage III ([place]) 06/22/2016   Benign prostatic hyperplasia without lower urinary tract symptoms 05/24/2018   Bradycardia    Cellulitis of toe of right foot 09/19/2014   Chest pain, unspecified 01/27/2020   Cirrhosis ([place]) 04/2018   Closed fracture of left upper extremity with routine healing 03/20/2018   Last Assessment & Plan:  Formatting of this note might be different from the original. Unclear etiology.   --Ortho consulted on admission s/p removal of cast and application of volar/dorsal splint  --Has had arranged follow-up with Emelda Brothers NP will need to continue to follow-up for removal of cast   Closed intertrochanteric fracture of left femur ([place]) 01/21/2018   Coagulopathy ([place]) 02/06/2019   COVID-19 virus infection 01/09/2019   Cutaneous abscess of buttock    Delirium 03/14/2018   Last Assessment & Plan:  Formatting of this note might be different from the original. Multifactorial-combination of AKI, hypercarbia, non-cirrhotic hyperammonemia, hospital-acquired  delirium-resolved.  Mental status now appears to be at baseline.  CT head- no acute findings: Generalized cerebral volume loss is advanced for patient's age, possible otomastoiditis correlate clinically(patient denies   Diabetes mellitus without complication ([place])    type II   Diabetic ketoacidosis ([place]) 12/08/2016   Diabetic ulcer of toe associated with type 2 diabetes mellitus, limited to breakdown of skin ([place]) 02/14/2017   Diarrhea, unspecified 06/08/2018   Difficult intravenous access 03/16/2018   Last Assessment & Plan:  Formatting of this note might be different from the original. TLC 2/21 Consent obtained from patient and his cousin Mosie Lukes   DKA (diabetic ketoacidosis) ([place]) 08/04/2020   Dyspnea, unspecified 06/01/2018   Encounter for removal of sutures 09/15/2020   Encounter for screening for respiratory tuberculosis 06/01/2018   End stage liver disease ([place]) 01/09/2019   Enterocolitis due to Clostridium difficile, not specified as recurrent 08/09/2018   admission   Epilepsy, unspecified, not intractable, without status epilepticus ([place])    ESRD (end stage renal disease) on dialysis ([place])    MWF    ESRD on hemodialysis ([place]) 12/27/2018   Failure to thrive in adult 01/21/2019   Fall 01/21/2018   Fever 06/18/2018   Fracture of left tibia 01/21/2018   Fracture of left ulna 01/21/2018   GERD (gastroesophageal reflux disease) 12/08/2016   Gout, unspecified    Gram negative sepsis ([place]) 12/27/2018   HCAP (healthcare-associated pneumonia) 04/21/2018   Heart failure, unspecified ([place])    History of Clostridioides difficile colitis 12/27/2018   HTN (hypertension) 03/08/2018   Formatting of this note might be different from the original. Last Assessment & Plan:  Patient on several meds as outpatient Cont hydralazine, low-dose amlodipine titrate as required Cont to monitor bp Last Assessment & Plan:  Formatting of this note might be different from the original. 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 pneumoniae) as the cause of diseases classified elsewhere 06/25/2018   admission   Leukocytosis 06/18/2018   Liver failure ([place])    Major depressive disorder, single episode, unspecified    Metabolic acidosis 50/9326   Metabolic bone disease 08/05/4578   Open wound of left foot except toes with complication 99/83/3825   Other cirrhosis of liver ([place]) 06/01/2018   Other fluid overload  04/10/2019   Other hypoglycemia 03/08/2018   type II Formatting of this note might be different from the original. Last Assessment & Plan:  Hemoglobin A1c 8 Carb controlled diet Sliding scale insulin, lantus 15 units nightly upon discharge up titrate as needed Medium carbohydrate diet at facility Fingerstick monitoring Last Assessment & Plan:  Formatting of this note might be different from the original. Hemoglobin A1c 8 Carb controlled diet   Pain, unspecified 07/09/2018   Personal history of COVID-19 02/25/2019   Physical deconditioning 03/08/2018   Last Assessment & Plan:  Formatting of this note might be different from the original. -Patient is resident at [facility]. -PT/OT consulted will need continue PT services at facility  -ST consult Formatting of this note might be different from the original. Last Assessment & Plan:  -Patient is resident at [facility] -PT/OT consulted will need continue PT services at facility  -ST consult   Pneumonia    Polyneuropathy, unspecified    Postprocedural hematoma of a genitourinary system organ or structure following a genitourinary system procedure 02/14/2019   Pressure injury of lower back, stage 2 ([place]) 03/08/2018   Last Assessment & Plan:  Formatting of this note might be different from the original. No discharge, clean appearing wound Continue  routine wound care Formatting of this note might be different from the original. Last Assessment & Plan:  No discharge, clean appearing wound Continue routine wound care   Pressure injury of skin 04/21/2018   Protein-calorie malnutrition, severe ([place]) 01/21/2019   Respiratory failure ([place])    Retroperitoneal abscess ([place]) 03/13/2019   Secondary hyperparathyroidism of renal origin ([place]) 08/28/2018   Seizures ([place]) 05/24/2018   Sepsis ([place]) 05/24/2018   Septic shock ([place]) 01/09/2019   Thrombocytopenia ([place]) 05/24/2018   Tobacco use    Trauma 01/21/2018   Type 2 diabetes mellitus with unspecified complications ([place]) 07/23/5282   Urinary calculus, unspecified 03/08/2018   Last Assessment & Plan:  Formatting of this note might be different from the original. Resolved  creatinine 7.3. on admission Most likely ATN-urine output now appears to stabilized Baseline  2.5-3 in June of 2019 according to OSH records No recent baseline available Nephro consulted patient started on HD via perm cath which he pulled 2/19 Cr improved Cont flomax renaly dose all meds Monitor cr and   Vitamin D deficiency     Patient Active Problem List   Diagnosis Date Noted   Diabetes mellitus due to underlying condition with unspecified complications ([place]) 13/24/4010   Acute encephalopathy    Bradycardia    Cutaneous abscess of buttock    Hyperglycemia    Hyperkalemia    Encounter for removal of sutures 09/15/2020   DKA (diabetic ketoacidosis) ([place]) 08/04/2020   Hypertension    Diabetes mellitus without complication ([place])    Tobacco use    Polyneuropathy, unspecified    Pneumonia    Major depressive disorder, single episode, unspecified    Liver failure ([place])    Heart failure, unspecified ([place])    Gout, unspecified    ESRD (end stage renal disease) on dialysis ([place])    Epilepsy, unspecified, not intractable, without status epilepticus ([place])    Anxiety disorder, unspecified    Chest pain, unspecified 01/27/2020   Other  fluid overload 04/10/2019   Allergy, unspecified, initial encounter 04/06/2019   Retroperitoneal abscess ([place]) 03/13/2019   Infection due to ESBL-producing Klebsiella pneumoniae 03/13/2019   Personal history of COVID-19 02/25/2019   Postprocedural hematoma of a genitourinary system organ or structure following  a genitourinary system procedure 02/14/2019   Coagulopathy ([place]) 02/06/2019   Failure to thrive in adult 01/21/2019   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   Protein-calorie malnutrition, severe ([place]) 01/21/2019   End stage liver disease ([place]) 01/09/2019   COVID-19 virus infection 01/09/2019   History of ESBL Klebsiella pneumoniae infection 01/09/2019   Septic shock ([place]) 01/09/2019   ESRD on hemodialysis ([place]) 12/27/2018   Gram negative sepsis ([place]) 12/27/2018   History of Clostridioides difficile colitis 12/27/2018   Anemia in chronic kidney disease 08/28/2018   Secondary hyperparathyroidism of renal origin ([place]) 08/28/2018   Enterocolitis due to Clostridium difficile, not specified as recurrent 08/09/2018   Pain, unspecified 07/09/2018   Klebsiella pneumoniae (k. pneumoniae) as the cause of diseases classified elsewhere 06/25/2018   Bacteremia due to Klebsiella pneumoniae 06/19/2018   Fever 06/18/2018   Anemia in ESRD (end-stage renal disease) ([place]) 06/18/2018   Anemia due to chronic kidney disease 06/18/2018   Leukocytosis 06/18/2018   Diarrhea, unspecified 06/08/2018   Dyspnea, unspecified 06/01/2018   Encounter for screening for respiratory tuberculosis 06/01/2018   Other cirrhosis of liver ([place]) 06/01/2018   Seizures ([place]) 05/24/2018   Benign prostatic hyperplasia without lower urinary tract symptoms 05/24/2018   Sepsis ([place]) 05/24/2018   Benign essential HTN 05/24/2018   Thrombocytopenia ([place]) 62/37/6283   Metabolic acidosis 15/17/6160   Metabolic bone disease 73/71/0626   Iron deficiency anemia 05/11/2018   Vitamin D deficiency 05/11/2018   Cirrhosis ([place])  04/2018   Respiratory failure ([place])    Pressure injury of skin 04/21/2018   Altered mental status, unspecified 04/21/2018   HCAP (healthcare-associated pneumonia) 04/21/2018   Anemia due to chronic kidney disease, on chronic dialysis ([place]) 04/07/2018   Closed fracture of left upper extremity with routine healing 03/20/2018   Difficult intravenous access 03/16/2018   Delirium 03/14/2018   Hyperammonemia ([place]) 03/14/2018   Hypocalcemia 03/08/2018   Urinary calculus, unspecified 03/08/2018   Physical deconditioning 03/08/2018   Pressure injury of lower back, stage 2 ([place]) 03/08/2018   HTN (hypertension) 03/08/2018   Other hypoglycemia 03/08/2018   Closed intertrochanteric fracture of left femur ([place]) 01/21/2018   Fall 01/21/2018   Fracture of left ulna 01/21/2018   Fracture of left tibia 01/21/2018   Trauma 01/21/2018   Diabetic ulcer of toe associated with type 2 diabetes mellitus, limited to breakdown of skin ([place]) 02/14/2017   Hyperosmolar non-ketotic state in patient with type 2 diabetes mellitus ([place]) 02/14/2017   Anemia 12/08/2016   Diabetic ketoacidosis ([place]) 12/08/2016   GERD (gastroesophageal reflux disease) 12/08/2016   Hyponatremia 12/08/2016   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   Open wound of left foot except toes with complication 94/85/4627   Hyperlipidemia 06/22/2016   Type 2 diabetes mellitus with unspecified complications ([place]) 03/50/0938   Benign hypertension with chronic kidney disease, stage III ([place]) 06/22/2016   Abscess of scrotum 09/19/2014   Cellulitis of toe of right foot 09/19/2014   Axillary abscess 02/03/2014   Hypokalemia 02/03/2014    Past Surgical History:  Procedure Laterality Date   APPENDECTOMY  1981   AV FISTULA PLACEMENT Right 10/23/2018   Procedure: ARTERIOVENOUS (AV) FISTULA CREATION RIGHT ARM;  Surgeon: [name], MD;  Location: [facility];  Service: Vascular;  Laterality: Right;   CHOLECYSTECTOMY     HERNIA REPAIR  2007   IR FLUORO GUIDE  CV LINE RIGHT  05/30/2018   IR FLUORO GUIDE CV LINE RIGHT  03/18/2019   IR FLUORO GUIDE CV LINE RIGHT  03/22/2019  IR REMOVAL TUN CV CATH W/O FL  03/15/2019   IR SINUS/FIST TUBE CHK-NON GI  04/04/2019   IR US GUIDE VASC ACCESS RIGHT  05/30/2018   IR US GUIDE VASC ACCESS RIGHT  03/18/2019   IR US GUIDE VASC ACCESS RIGHT  03/22/2019   NEPHRECTOMY Left 2020    Current Outpatient Medications  Medication Sig Dispense Refill   acetaminophen (TYLENOL) 325 MG tablet Take 650 mg by mouth every 8 (eight) hours as needed for fever (pain).     allopurinol (ZYLOPRIM) 100 MG tablet Take 100 mg by mouth daily.      Calcium Carbonate Antacid (TUMS PO) Take 2 tablets by mouth daily before breakfast.     carbamazepine (TEGRETOL XR) 100 MG 12 hr tablet Take 100 mg by mouth 2 (two) times daily. Take with 800 mg dose for a total daily dose of 900 mg twice daily     carbamazepine (TEGRETOL XR) 400 MG 12 hr tablet Take 800 mg by mouth 2 (two) times daily. Take with 100 mg dose for a total daily dose of 900 mg twice daily     Dextrose, Diabetic Use, (INSTA-GLUCOSE) 77.4 % GEL Take 1 Dose by mouth. Give 1 dose by mouth as needed BG less than 70, patient is arousable conscious and able to swallow. Hold all diabetic medications until provider authorizes resumption. Remain with patient. Keep pt in bed/chair for safety. Repeat blood glucose in 15 min.     DULoxetine (CYMBALTA) 20 MG capsule Take 20 mg by mouth daily.     Glucagon, rDNA, (GLUCAGON EMERGENCY) 1 MG KIT Inject 1 mg as directed as needed. For BG less than 70, not arousable conscious or able to swallow. If repeat blood glucose is below 70 mg/dl and pt is NOT arousable, conscious, or able to swallow, continue to all diabetic medications until provider authorizes resumption. Remain with patient. Keep pt in bed/chair for safety.     insulin aspart (NOVOLOG) 100 UNIT/ML injection Inject 8 Units into the skin 3 (three) times daily with meals.     insulin aspart (NOVOLOG) 100  UNIT/ML injection Inject 3-15 Units into the skin See admin instructions. 3 times daily per sliding scale 140-180 = 3 units 181-241 = 4 units  242-300 = 6 units 301-350 = 8 units 351-400 =  12 units 401-450 = 15 units     Insulin Glargine (BASAGLAR KWIKPEN) 100 UNIT/ML Inject 25 Units into the skin at bedtime. (Patient taking differently: Inject 30 Units into the skin at bedtime.) 8.4 mL 0   Ipratropium-Albuterol (COMBIVENT) 20-100 MCG/ACT AERS respimat Inhale 1 puff into the lungs every 6 (six) hours as needed for wheezing.     ketoconazole (NIZORAL) 2 % cream Apply 1 Application topically. Tues, Fri, Sun for dry scalp     lactulose (CHRONULAC) 10 GM/15ML solution Take 15 mLs by mouth 2 (two) times daily.     levETIRAcetam (KEPPRA) 500 MG tablet Take 55m(1 tab) twice daily. Take 1/2 tablet (2528m right after dialysis M/W/F. (For a total of 66 tabs monthly) 198 tablet 4   lidocaine-prilocaine (EMLA) cream Apply 1 Application topically.     Multiple Vitamin (MULTIVITAMIN WITH MINERALS) TABS tablet Take 1 tablet by mouth daily.     omeprazole (PRILOSEC) 20 MG capsule Take 20 mg by mouth daily.      PHENobarbital (LUMINAL) 64.8 MG tablet Take 1 tablet (64.8 mg total) by mouth at bedtime. Take with two of the 97.2 mg tablets for total dose of 259.2  mg. (Patient taking differently: Take 4 tablets by mouth at bedtime. Take with two of the 97.2 mg tablets for total dose of 259.2 mg.) 30 tablet 0   simvastatin (ZOCOR) 20 MG tablet Take 20 mg by mouth at bedtime.   3   tamsulosin (FLOMAX) 0.4 MG CAPS capsule Take 0.4 mg by mouth at bedtime.     TRADJENTA 5 MG TABS tablet Take 5 mg by mouth daily.     No current facility-administered medications for this visit.    Allergies as of 07/22/2021 - Review Complete 07/22/2021  Allergen Reaction Noted   Fish oil Swelling and Other (See Comments) 04/24/2013   Ethyl acetate  06/18/2019   Omega-3-acid ethyl esters  01/21/2021    Vitals: BP (!) 78/50 (BP  Location: Left Arm, Patient Position: Sitting) Comment: pt asymptomatic; manual repeat  Pulse 77   Ht _0  (1.88 m)   Wt 224 lb 3.3 oz (101.7 kg)   BMI 28.79 kg/m  Last Weight:  Wt Readings from Last 1 Encounters:  07/22/21 224 lb 3.3 oz (101.7 kg)   Last Height:   Ht Readings from Last 1 Encounters:  07/22/21 _1  (1.88 m)   Physical exam: Stable, no changes Exam: Gen: NAD, conversant, poor dentition and poorly groomed,psychomotor slowing              CV: RRR, no MRG. No Carotid Bruits. No peripheral edema, warm, nontender Eyes: Conjunctivae clear without exudates or hemorrhage  Neuro: Stable, no changes Detailed Neurologic Exam  Speech:    Speech is normal; fluent and spontaneous with normal comprehension.  Cognition:    The patient is oriented to person, place, and time;     recent and remote memory impaired;     language fluent;     normal attention, concentration.    Impaired fund of knowledge. Cranial Nerves: Left pupil 91m and responsive. Right is 228mand sluggish. Attempted fundoscopy could not visualize. Visual fields are full to finger confrontation. Extraocular movements are intact. Trigeminal sensation is intact and the muscles of mastication are normal. The face is symmetric. The palate elevates in the midline. Hearing intact. Voice is normal. Shoulder shrug is normal. The tongue has normal motion without fasciculations.   Coordination:    No dysmetria or ataxia noted, difficulty with FTN right due to weakness  Gait:    Cannot stand unattended, wheelchair  Motor Observation:    no involuntary movements noted. Tone:    Normal muscle tone.    Posture:    Posture is normal.     Strength:  Mild weakness right arm and right leg more proximally 3+/5 prox and 4-4+distally. left hand weakness of ulnar digit 5.           Sensation: intact to LT     Reflex Exam:  DTR's:    Deep tendon reflexes in the upper and lower extremities are hyporeflexic  bilaterally; absent lowers, trace biceps.   Toes:    The toes are equivocal bilaterally.   Clonus:    Clonus is absent  Assessment/Plan [age].o. male here as requested by [facility] seizures. PMHx ESRD on HD, ESBL nephrectomy, type 2 diabetes, hypertension, heart failure, urea cycle metabolism disorder, noncompliance, obstructive sleep apnea, epilepsy, morbid severe obesity with hypoventilation, muscle wasting and atrophy, polyneuropathy, abnormalities of gait and mobility, hyperlipidemia, depression, anxiety, gout, tobacco use with recent admission for retroperitoneal abscess. Last time I saw him, he was stable and had been for many years  on the same medications so we did not change. Seizures since the age of 77 months.   Addendum: discussed with epilepsy colleagues. - Patient's phenobarb level is 7. Trileptal is in therapeutic range. Both phenobarb and trileptal are inducers which is why I think his levels are so low despire being on high doses of both(has been on these meds for decades). At this time due to his breakthrough seizures I would like to add Keppra: Take 541m(1 tab) twice daily. Take 1/2 tablet (2514m after dialysis M/W/F. Once he is not having breakthrough seizures we will start tapering his phenobarb by 50% monthly. We will have to see patient monthly and watch the trileptal levels to ensure he doesn't become toxic as we decrease phenobarb (he is on 90058mid trileptal) and trileptal may need to be adjusted as we titrate off of the phenobarb. Please call and explain to facility and schedule a follow up with Dr. AheJaynee Eagles 30 days thanks  - He says he is having repeated "seizures",  everything gets slow and he has a hot flash for 30 seconds. He remembers the whole things and have a conversation. He is having them 1-2 days a week and can have them 12 times in a day. He used to have "grand mal" seizures but he calls these "mini seizures".  He says he misses his medications and has jerking  on the right side. The semiology sounds unusual, we discussed getting levels and getting a 72 hour eeg which did not catch a seizure.  - Phenobrab level is 7 despite being on a high dose. He has osteopenia per films, may even have osteoporosis. This is a good time to try and get him off the phenobarb. We will add Keppra 500m15mD with 250mg9mplement after dialysis. Once he feels he no longer has seizures we will decrease the phenobarb by 50% every month until he has been titrated off.   -Patient is a poor historian, initialy he reported seizures and trileptal/phenobarb since a child and had been on current seizure medication for years. I could not find any history for his seizures.Initially I continued current dose of AEDsdespite lower levels because  was stable so we did not adjust any medications in the past. Given his breakthrough seizures and very low phenobarb levels with side effects of decreased bone mineral density this is an apportunity to change medications as above  Orders Placed This Encounter  Procedures   Phenobarbital level       Cc: McGheLattie Corns, [place]heLattie CornsC  [facility] [address][id number] [phone] over 45 minutes of face-to-face and non-face-to-face time with patient on the  1. Nonintractable epilepsy without status epilepticus, unspecified epilepsy type (HCC) [place]. Long-term use of high-risk medication   3. Seizure disorder (HCC) [place]  diagnosis.  This included previsit chart review, lab review, study review, order entry, electronic health record documentation, patient education on the different diagnostic and therapeutic options, counseling and coordination of care, risks and benefits of management, compliance, or risk factor reduction

## 2021-07-23 LAB — PHENOBARBITAL LEVEL: Phenobarbital, Serum: 7 ug/mL — ABNORMAL LOW (ref 15–40)

## 2021-07-28 ENCOUNTER — Telehealth: Payer: Self-pay | Admitting: Neurology

## 2021-07-28 MED ORDER — LEVETIRACETAM 500 MG PO TABS: ORAL_TABLET | ORAL | 4 refills | Status: DC

## 2021-07-28 NOTE — Telephone Encounter (Signed)
Patient's phenobarb level is 7. Trileptal is in therapeutic range. Both phenobarb and trileptal are inducers which is why I think his levels are so low despire being on high doses of both(has been on these meds for decades). At this time due to his breakthrough seizures I would like to add Keppra: Take 572m(1 tab) twice daily. Take 1/2 tablet (2575m after dialysis M/W/F. Once he is not having breakthrough seizures we will start tapering his phenobarb by 50% monthly. We will have to see patient monthly and watch the trileptal levels to ensure he doesn't become toxic as we decrease phenobarb (he is on 90083mid trileptal) and trileptal may need to be adjusted as we titrate off of the phenobarb. Please call and explain to facility and schedule a follow up with Dr. AheJaynee Eagles 30 days thanks

## 2021-07-30 NOTE — Telephone Encounter (Signed)
Spoke with pt's nurse [facility] and discussed results and plan of care as noted below by Dr [name]. Lattie Haw stated they did have the prescription so far as Levetiracetam 250 mg right after Dialysis on MWF. She verbalized understanding that patient should be taking 500 mg twice daily everyday and then he would taken additional 250 mg (1/2 tab) after dialysis. She made these changes to the pt's [facility] and I faxed a copy of the prescription directly to her at [phone]. Received a receipt of confirmation. The staff member who schedules appt was not there at the time so I will call her next Monday to schedule the one month follow-up appt.

## 2021-08-02 NOTE — Telephone Encounter (Signed)
Butts and scheduled an appt for pt on Thurs 8/3 @ 4 pm arrival [phone] pm.

## 2021-08-26 ENCOUNTER — Encounter: Payer: Self-pay | Admitting: Neurology

## 2021-08-26 ENCOUNTER — Ambulatory Visit: Payer: Medicare Other | Admitting: Neurology

## 2021-10-11 ENCOUNTER — Other Ambulatory Visit: Payer: Self-pay

## 2021-10-11 ENCOUNTER — Inpatient Hospital Stay (HOSPITAL_COMMUNITY)
Admission: EM | Admit: 2021-10-11 | Discharge: 2021-10-12 | DRG: 640 | Disposition: A | Discharge status: Skilled Nursing Facility | Payer: Medicare Other | Source: Skilled Nursing Facility | Attending: Emergency Medicine | Admitting: Emergency Medicine

## 2021-10-11 ENCOUNTER — Encounter (HOSPITAL_COMMUNITY): Payer: Self-pay | Admitting: Emergency Medicine

## 2021-10-11 ENCOUNTER — Inpatient Hospital Stay (HOSPITAL_COMMUNITY): Payer: Medicare Other

## 2021-10-11 DIAGNOSIS — F419 Anxiety disorder, unspecified: Secondary | ICD-10-CM | POA: Diagnosis present

## 2021-10-11 DIAGNOSIS — Z8616 Personal history of COVID-19: Secondary | ICD-10-CM

## 2021-10-11 DIAGNOSIS — N2581 Secondary hyperparathyroidism of renal origin: Secondary | ICD-10-CM | POA: Diagnosis present

## 2021-10-11 DIAGNOSIS — E875 Hyperkalemia: Principal | ICD-10-CM | POA: Diagnosis present

## 2021-10-11 DIAGNOSIS — E1142 Type 2 diabetes mellitus with diabetic polyneuropathy: Secondary | ICD-10-CM | POA: Diagnosis present

## 2021-10-11 DIAGNOSIS — E11621 Type 2 diabetes mellitus with foot ulcer: Secondary | ICD-10-CM | POA: Diagnosis present

## 2021-10-11 DIAGNOSIS — K219 Gastro-esophageal reflux disease without esophagitis: Secondary | ICD-10-CM | POA: Diagnosis present

## 2021-10-11 DIAGNOSIS — Z91158 Patient's noncompliance with renal dialysis for other reason: Secondary | ICD-10-CM | POA: Diagnosis not present

## 2021-10-11 DIAGNOSIS — R9431 Abnormal electrocardiogram [ECG] [EKG]: Secondary | ICD-10-CM

## 2021-10-11 DIAGNOSIS — R001 Bradycardia, unspecified: Secondary | ICD-10-CM | POA: Diagnosis present

## 2021-10-11 DIAGNOSIS — I959 Hypotension, unspecified: Secondary | ICD-10-CM | POA: Diagnosis present

## 2021-10-11 DIAGNOSIS — E8729 Other acidosis: Secondary | ICD-10-CM

## 2021-10-11 DIAGNOSIS — N400 Enlarged prostate without lower urinary tract symptoms: Secondary | ICD-10-CM | POA: Diagnosis present

## 2021-10-11 DIAGNOSIS — N186 End stage renal disease: Secondary | ICD-10-CM

## 2021-10-11 DIAGNOSIS — Z888 Allergy status to other drugs, medicaments and biological substances status: Secondary | ICD-10-CM | POA: Diagnosis not present

## 2021-10-11 DIAGNOSIS — E1165 Type 2 diabetes mellitus with hyperglycemia: Secondary | ICD-10-CM | POA: Diagnosis present

## 2021-10-11 DIAGNOSIS — Z905 Acquired absence of kidney: Secondary | ICD-10-CM | POA: Diagnosis not present

## 2021-10-11 DIAGNOSIS — M898X9 Other specified disorders of bone, unspecified site: Secondary | ICD-10-CM | POA: Diagnosis present

## 2021-10-11 DIAGNOSIS — K7469 Other cirrhosis of liver: Secondary | ICD-10-CM | POA: Diagnosis present

## 2021-10-11 DIAGNOSIS — Z992 Dependence on renal dialysis: Secondary | ICD-10-CM

## 2021-10-11 DIAGNOSIS — D631 Anemia in chronic kidney disease: Secondary | ICD-10-CM

## 2021-10-11 DIAGNOSIS — I4949 Other premature depolarization: Secondary | ICD-10-CM | POA: Diagnosis not present

## 2021-10-11 DIAGNOSIS — Z7984 Long term (current) use of oral hypoglycemic drugs: Secondary | ICD-10-CM

## 2021-10-11 DIAGNOSIS — Z91199 Patient's noncompliance with other medical treatment and regimen due to unspecified reason: Secondary | ICD-10-CM

## 2021-10-11 DIAGNOSIS — Z794 Long term (current) use of insulin: Secondary | ICD-10-CM | POA: Diagnosis not present

## 2021-10-11 DIAGNOSIS — I120 Hypertensive chronic kidney disease with stage 5 chronic kidney disease or end stage renal disease: Secondary | ICD-10-CM | POA: Diagnosis present

## 2021-10-11 DIAGNOSIS — E43 Unspecified severe protein-calorie malnutrition: Secondary | ICD-10-CM | POA: Diagnosis present

## 2021-10-11 DIAGNOSIS — F17210 Nicotine dependence, cigarettes, uncomplicated: Secondary | ICD-10-CM | POA: Diagnosis present

## 2021-10-11 DIAGNOSIS — R531 Weakness: Secondary | ICD-10-CM

## 2021-10-11 DIAGNOSIS — E872 Acidosis, unspecified: Secondary | ICD-10-CM | POA: Diagnosis present

## 2021-10-11 DIAGNOSIS — K7210 Chronic hepatic failure without coma: Secondary | ICD-10-CM | POA: Diagnosis present

## 2021-10-11 DIAGNOSIS — Z87442 Personal history of urinary calculi: Secondary | ICD-10-CM

## 2021-10-11 DIAGNOSIS — Z9049 Acquired absence of other specified parts of digestive tract: Secondary | ICD-10-CM

## 2021-10-11 DIAGNOSIS — G40909 Epilepsy, unspecified, not intractable, without status epilepticus: Secondary | ICD-10-CM

## 2021-10-11 DIAGNOSIS — E088 Diabetes mellitus due to underlying condition with unspecified complications: Secondary | ICD-10-CM | POA: Diagnosis present

## 2021-10-11 DIAGNOSIS — Z79899 Other long term (current) drug therapy: Secondary | ICD-10-CM

## 2021-10-11 DIAGNOSIS — M109 Gout, unspecified: Secondary | ICD-10-CM | POA: Diagnosis present

## 2021-10-11 DIAGNOSIS — J811 Chronic pulmonary edema: Secondary | ICD-10-CM

## 2021-10-11 DIAGNOSIS — R112 Nausea with vomiting, unspecified: Secondary | ICD-10-CM

## 2021-10-11 DIAGNOSIS — Z7951 Long term (current) use of inhaled steroids: Secondary | ICD-10-CM

## 2021-10-11 LAB — COMPREHENSIVE METABOLIC PANEL
ALT: 15 U/L (ref 0–44)
AST: 18 U/L (ref 15–41)
Albumin: 2.8 g/dL — ABNORMAL LOW (ref 3.5–5.0)
Alkaline Phosphatase: 115 U/L (ref 38–126)
Anion gap: 20 — ABNORMAL HIGH (ref 5–15)
BUN: 87 mg/dL — ABNORMAL HIGH (ref 6–20)
CO2: 8 mmol/L — ABNORMAL LOW (ref 22–32)
Calcium: 7 mg/dL — ABNORMAL LOW (ref 8.9–10.3)
Chloride: 111 mmol/L (ref 98–111)
Creatinine, Ser: 14.94 mg/dL — ABNORMAL HIGH (ref 0.61–1.24)
GFR, Estimated: 4 mL/min — ABNORMAL LOW (ref >60)
Glucose, Bld: 323 mg/dL — ABNORMAL HIGH (ref 70–99)
Potassium: >7.5 mmol/L (ref 3.5–5.1)
Sodium: 139 mmol/L (ref 135–145)
Total Bilirubin: 0.7 mg/dL (ref 0.3–1.2)
Total Protein: 6.2 g/dL — ABNORMAL LOW (ref 6.5–8.1)

## 2021-10-11 LAB — CBC WITH DIFFERENTIAL/PLATELET
Abs Immature Granulocytes: 0.12 10*3/uL — ABNORMAL HIGH (ref 0.00–0.07)
Basophils Absolute: 0.1 10*3/uL (ref 0.0–0.1)
Basophils Relative: 1 %
Eosinophils Absolute: 0.1 10*3/uL (ref 0.0–0.5)
Eosinophils Relative: 1 %
HCT: 29.7 % — ABNORMAL LOW (ref 39.0–52.0)
Hemoglobin: 9.5 g/dL — ABNORMAL LOW (ref 13.0–17.0)
Immature Granulocytes: 1 %
Lymphocytes Relative: 19 %
Lymphs Abs: 1.7 10*3/uL (ref 0.7–4.0)
MCH: 35.7 pg — ABNORMAL HIGH (ref 26.0–34.0)
MCHC: 32 g/dL (ref 30.0–36.0)
MCV: 111.7 fL — ABNORMAL HIGH (ref 80.0–100.0)
Monocytes Absolute: 0.9 10*3/uL (ref 0.1–1.0)
Monocytes Relative: 10 %
Neutro Abs: 6 10*3/uL (ref 1.7–7.7)
Neutrophils Relative %: 68 %
Platelets: 84 10*3/uL — ABNORMAL LOW (ref 150–400)
RBC: 2.66 MIL/uL — ABNORMAL LOW (ref 4.22–5.81)
RDW: 16.2 % — ABNORMAL HIGH (ref 11.5–15.5)
Smear Review: DECREASED
WBC: 8.8 10*3/uL (ref 4.0–10.5)
nRBC: 0 % (ref 0.0–0.2)

## 2021-10-11 LAB — HEPATITIS C ANTIBODY: HCV Ab: NONREACTIVE

## 2021-10-11 LAB — HIV ANTIBODY (ROUTINE TESTING W REFLEX): HIV Screen 4th Generation wRfx: NONREACTIVE

## 2021-10-11 LAB — HEPATITIS B SURFACE ANTIBODY,QUALITATIVE: Hep B S Ab: REACTIVE — AB

## 2021-10-11 LAB — GLUCOSE, CAPILLARY
Glucose-Capillary: 142 mg/dL — ABNORMAL HIGH (ref 70–99)
Glucose-Capillary: 127 mg/dL — ABNORMAL HIGH (ref 70–99)

## 2021-10-11 LAB — ECHOCARDIOGRAM COMPLETE
AR max vel: 3.21 cm2
AV Area VTI: 3.68 cm2
AV Area mean vel: 3.1 cm2
AV Mean grad: 5 mmHg
AV Peak grad: 8.3 mmHg
Ao pk vel: 1.44 m/s
Area-P 1/2: 3.31 cm2
Height: 74 in
S' Lateral: 3.7 cm
Weight: 3985.92 oz

## 2021-10-11 LAB — PROCALCITONIN: Procalcitonin: 0.28 ng/mL

## 2021-10-11 LAB — LIPASE, BLOOD: Lipase: 19 U/L (ref 11–51)

## 2021-10-11 LAB — HEPATITIS B CORE ANTIBODY, TOTAL: Hep B Core Total Ab: NONREACTIVE

## 2021-10-11 LAB — MAGNESIUM: Magnesium: 3.2 mg/dL — ABNORMAL HIGH (ref 1.7–2.4)

## 2021-10-11 LAB — LACTIC ACID, PLASMA: Lactic Acid, Venous: 1.8 mmol/L (ref 0.5–1.9)

## 2021-10-11 LAB — CBG MONITORING, ED: Glucose-Capillary: 311 mg/dL — ABNORMAL HIGH (ref 70–99)

## 2021-10-11 LAB — HEPATITIS B SURFACE ANTIGEN: Hepatitis B Surface Ag: NONREACTIVE

## 2021-10-11 MED ORDER — PENTAFLUOROPROP-TETRAFLUOROETH EX AERO: 1.0000 | INHALATION_SPRAY | CUTANEOUS | Status: DC | PRN

## 2021-10-11 MED ORDER — SODIUM BICARBONATE 8.4 % IV SOLN
50.0000 meq | Freq: Once | INTRAVENOUS | Status: AC
  Administered 2021-10-11: 50 meq via INTRAVENOUS
  Filled 2021-10-11: qty 50

## 2021-10-11 MED ORDER — LEVETIRACETAM 500 MG PO TABS
500.0000 mg | ORAL_TABLET | Freq: Two times a day (BID) | ORAL | Status: DC
  Administered 2021-10-11 – 2021-10-12 (×2): 500 mg via ORAL
  Filled 2021-10-11 (×2): qty 1

## 2021-10-11 MED ORDER — SODIUM BICARBONATE 8.4 % IV SOLN: 100.0000 meq | Freq: Once | INTRAVENOUS | Status: DC

## 2021-10-11 MED ORDER — POLYETHYLENE GLYCOL 3350 17 G PO PACK: 17.0000 g | PACK | Freq: Every day | ORAL | Status: DC | PRN

## 2021-10-11 MED ORDER — PHENOBARBITAL 32.4 MG PO TABS
259.2000 mg | ORAL_TABLET | Freq: Every day | ORAL | Status: DC
  Administered 2021-10-11: 259.2 mg via ORAL
  Filled 2021-10-11 (×2): qty 8

## 2021-10-11 MED ORDER — HEPARIN SODIUM (PORCINE) 5000 UNIT/ML IJ SOLN
5000.0000 [IU] | Freq: Three times a day (TID) | INTRAMUSCULAR | Status: DC
  Administered 2021-10-11 – 2021-10-12 (×2): 5000 [IU] via SUBCUTANEOUS
  Filled 2021-10-11 (×2): qty 1

## 2021-10-11 MED ORDER — ONDANSETRON HCL 4 MG/2ML IJ SOLN
4.0000 mg | Freq: Once | INTRAMUSCULAR | Status: AC
  Administered 2021-10-11: 4 mg via INTRAVENOUS
  Filled 2021-10-11: qty 2

## 2021-10-11 MED ORDER — LIDOCAINE HCL (PF) 1 % IJ SOLN: 5.0000 mL | INTRAMUSCULAR | Status: DC | PRN

## 2021-10-11 MED ORDER — CARBAMAZEPINE ER 100 MG PO TB12
100.0000 mg | ORAL_TABLET | Freq: Two times a day (BID) | ORAL | Status: DC
  Administered 2021-10-11 – 2021-10-12 (×2): 100 mg via ORAL
  Filled 2021-10-11 (×3): qty 1

## 2021-10-11 MED ORDER — ALBUTEROL SULFATE (2.5 MG/3ML) 0.083% IN NEBU
10.0000 mg | INHALATION_SOLUTION | Freq: Once | RESPIRATORY_TRACT | Status: AC
  Administered 2021-10-11: 10 mg via RESPIRATORY_TRACT
  Filled 2021-10-11: qty 12

## 2021-10-11 MED ORDER — DOCUSATE SODIUM 100 MG PO CAPS: 100.0000 mg | ORAL_CAPSULE | Freq: Two times a day (BID) | ORAL | Status: DC | PRN

## 2021-10-11 MED ORDER — ALLOPURINOL 100 MG PO TABS
100.0000 mg | ORAL_TABLET | Freq: Every day | ORAL | Status: DC
  Administered 2021-10-11 – 2021-10-12 (×2): 100 mg via ORAL
  Filled 2021-10-11 (×2): qty 1

## 2021-10-11 MED ORDER — INSULIN ASPART 100 UNIT/ML IJ SOLN
0.0000 [IU] | INTRAMUSCULAR | Status: DC
  Administered 2021-10-12: 2 [IU] via SUBCUTANEOUS
  Administered 2021-10-12: 1 [IU] via SUBCUTANEOUS

## 2021-10-11 MED ORDER — DOXERCALCIFEROL 4 MCG/2ML IV SOLN: 3.0000 ug | INTRAVENOUS | Status: DC

## 2021-10-11 MED ORDER — CALCIUM GLUCONATE-NACL 2-0.675 GM/100ML-% IV SOLN
2.0000 g | Freq: Once | INTRAVENOUS | Status: AC
  Administered 2021-10-11: 2000 mg via INTRAVENOUS
  Filled 2021-10-11: qty 100

## 2021-10-11 MED ORDER — LACTULOSE 10 GM/15ML PO SOLN
10.0000 g | Freq: Two times a day (BID) | ORAL | Status: DC
  Administered 2021-10-11 – 2021-10-12 (×2): 10 g via ORAL
  Filled 2021-10-11 (×2): qty 30

## 2021-10-11 MED ORDER — PHENOBARBITAL 97.2 MG PO TABS: 259.2000 mg | ORAL_TABLET | Freq: Every day | ORAL | Status: DC

## 2021-10-11 MED ORDER — IPRATROPIUM-ALBUTEROL 20-100 MCG/ACT IN AERS: 1.0000 | INHALATION_SPRAY | Freq: Four times a day (QID) | RESPIRATORY_TRACT | Status: DC | PRN

## 2021-10-11 MED ORDER — COLLAGENASE 250 UNIT/GM EX OINT
TOPICAL_OINTMENT | Freq: Every day | CUTANEOUS | Status: DC
  Filled 2021-10-11: qty 30

## 2021-10-11 MED ORDER — SIMVASTATIN 20 MG PO TABS
20.0000 mg | ORAL_TABLET | Freq: Every day | ORAL | Status: DC
  Administered 2021-10-11: 20 mg via ORAL
  Filled 2021-10-11: qty 1

## 2021-10-11 MED ORDER — HEPARIN SODIUM (PORCINE) 1000 UNIT/ML DIALYSIS: 20.0000 [IU]/kg | INTRAMUSCULAR | Status: DC | PRN

## 2021-10-11 MED ORDER — SODIUM ZIRCONIUM CYCLOSILICATE 10 G PO PACK
10.0000 g | PACK | Freq: Once | ORAL | Status: AC
  Administered 2021-10-11: 10 g via ORAL
  Filled 2021-10-11: qty 1

## 2021-10-11 MED ORDER — LEVETIRACETAM 250 MG PO TABS
250.0000 mg | ORAL_TABLET | ORAL | Status: DC
  Administered 2021-10-11: 250 mg via ORAL
  Filled 2021-10-11: qty 1

## 2021-10-11 MED ORDER — CARBAMAZEPINE ER 200 MG PO TB12
800.0000 mg | ORAL_TABLET | Freq: Two times a day (BID) | ORAL | Status: DC
  Administered 2021-10-11 – 2021-10-12 (×2): 800 mg via ORAL
  Filled 2021-10-11 (×3): qty 4

## 2021-10-11 MED ORDER — CHLORHEXIDINE GLUCONATE CLOTH 2 % EX PADS
6.0000 | MEDICATED_PAD | Freq: Every day | CUTANEOUS | Status: DC
  Administered 2021-10-12: 6 via TOPICAL

## 2021-10-11 MED ORDER — CALCIUM GLUCONATE 10 % IV SOLN
1.0000 g | Freq: Once | INTRAVENOUS | Status: AC
  Administered 2021-10-11: 1 g via INTRAVENOUS
  Filled 2021-10-11: qty 10

## 2021-10-11 MED ORDER — INSULIN ASPART 100 UNIT/ML IJ SOLN
5.0000 [IU] | Freq: Once | INTRAMUSCULAR | Status: AC
  Administered 2021-10-11: 5 [IU] via INTRAVENOUS

## 2021-10-11 MED ORDER — FAMOTIDINE 20 MG PO TABS
20.0000 mg | ORAL_TABLET | Freq: Every day | ORAL | Status: DC
  Administered 2021-10-12: 20 mg via ORAL
  Filled 2021-10-11: qty 1

## 2021-10-11 MED ORDER — DARBEPOETIN ALFA 100 MCG/0.5ML IJ SOSY: 100.0000 ug | PREFILLED_SYRINGE | INTRAMUSCULAR | Status: DC

## 2021-10-11 MED ORDER — DULOXETINE HCL 20 MG PO CPEP
20.0000 mg | ORAL_CAPSULE | Freq: Every day | ORAL | Status: DC
  Administered 2021-10-11 – 2021-10-12 (×2): 20 mg via ORAL
  Filled 2021-10-11 (×2): qty 1

## 2021-10-11 MED ORDER — LIDOCAINE-PRILOCAINE 2.5-2.5 % EX CREA: 1.0000 | TOPICAL_CREAM | CUTANEOUS | Status: DC | PRN

## 2021-10-11 NOTE — ED Triage Notes (Signed)
Per EMS pt has refused meds last night

## 2021-10-11 NOTE — ED Triage Notes (Signed)
Pt BIB [facility] from [facility] for c/o weakness and generalized body pain, pt is dialysis pt-last went 1 week ago, missed W, F; pt c/o right arm swelling x 1 week-fistula is placed in R arm;   EMS v/s: HR 40-45 BP 118/60 R 18 O2 95% 12 lead wide

## 2021-10-11 NOTE — Progress Notes (Signed)
Received patient in bed to unit.  Alert and oriented.  Informed consent signed and in chart.   Treatment initiated: 0940 Treatment completed: 2956  Patient tolerated well.  Transported back to the room  Alert, without acute distress.  Hand-off given to patient's nurse.   Access used: AVF Access issues: none  Total UF removed: 3 L  Medication(s) given: none Post HD VS: 136/57. P 66 R 16. Post HD weight: 113 kg,   [facility]

## 2021-10-11 NOTE — H&P (Signed)
NAME:  Eddie Davies, MRN:  [id number], DOB:  1970/04/05, LOS: 0 ADMISSION DATE:  10/11/2021, CONSULTATION DATE:  10/11/2021 REFERRING MD:  Sabra Heck, CHIEF COMPLAINT:  Hyperkalemia with bradycardia and QTC prolongation , pulmonary edema and soft BP   History of Present Illness:  The patient is a 51 y.o. year-old w/ hx of anemia, ESRD on HD  MWF , HTN, cirrhosis, DM2, gout, seizures who presented to ED w/ gen'd weakness. Labs showed K+ > 7.5. EKG showing severe IVCD and junctional rhythm, QRS 211 msec (prior EKG w/ normal K+ in Jan 2022 shows NSR w/ ivcd of 124 msec). In ED pt has rec'd IV Ca++/ insulin/ glucose/ bicarb, nebulized albuterol and po lokelma. Last HD was 10/04/2021. Pt. Did recently have a procedure on his right heel. He is not walking as a result. Pt. Is being admitted with plan to do emergent HD to stabilize EKG . PCCM will admit and manage care after HD   Pertinent  Medical History    Past Medical History:  Diagnosis Date   Abscess of scrotum 09/19/2014   Acute encephalopathy    Allergy, unspecified, initial encounter 04/06/2019   Altered mental status, unspecified 04/21/2018   Anemia 12/08/2016   Formatting of this note might be different from the original. Last Assessment & Plan:  Multifactorial chronic with DDx including iron deficiency, CKD, acute blood loss from self discontinuation of HD catheter.  Noted pancytopenia.  No gross blood loss in stool noticed, FOBT neg.  Transfused 1unit PRBC 2/20. - Continue oral iron - Consulted hematology-recs appreciated etiology likely hepatosplenome   Anemia due to chronic kidney disease 06/18/2018   Anemia due to chronic kidney disease, on chronic dialysis ([place]) 04/07/2018   Anemia in chronic kidney disease 08/28/2018   Anemia in ESRD (end-stage renal disease) ([place]) 06/18/2018   Anxiety disorder, unspecified    Axillary abscess 02/03/2014   Bacteremia due to Klebsiella pneumoniae 06/19/2018   Benign essential HTN 05/24/2018    Benign hypertension with chronic kidney disease, stage III ([place]) 06/22/2016   Benign prostatic hyperplasia without lower urinary tract symptoms 05/24/2018   Bradycardia    Cellulitis of toe of right foot 09/19/2014   Chest pain, unspecified 01/27/2020   Cirrhosis ([place]) 04/2018   Closed fracture of left upper extremity with routine healing 03/20/2018   Last Assessment & Plan:  Formatting of this note might be different from the original. Unclear etiology.   --Ortho consulted on admission s/p removal of cast and application of volar/dorsal splint  --Has had arranged follow-up with Emelda Brothers NP will need to continue to follow-up for removal of cast   Closed intertrochanteric fracture of left femur ([place]) 01/21/2018   Coagulopathy ([place]) 02/06/2019   COVID-19 virus infection 01/09/2019   Cutaneous abscess of buttock    Delirium 03/14/2018   Last Assessment & Plan:  Formatting of this note might be different from the original. Multifactorial-combination of AKI, hypercarbia, non-cirrhotic hyperammonemia, hospital-acquired delirium-resolved.  Mental status now appears to be at baseline.  CT head- no acute findings: Generalized cerebral volume loss is advanced for patient's age, possible otomastoiditis correlate clinically(patient denies   Diabetes mellitus without complication ([place])    type II   Diabetic ketoacidosis ([place]) 12/08/2016   Diabetic ulcer of toe associated with type 2 diabetes mellitus, limited to breakdown of skin ([place]) 02/14/2017   Diarrhea, unspecified 06/08/2018   Difficult intravenous access 03/16/2018   Last Assessment & Plan:  Formatting of this note might be different from the  original. TLC 2/21 Consent obtained from patient and his cousin Mosie Lukes   DKA (diabetic ketoacidosis) ([place]) 08/04/2020   Dyspnea, unspecified 06/01/2018   Encounter for removal of sutures 09/15/2020   Encounter for screening for respiratory tuberculosis 06/01/2018   End stage liver disease ([place])  01/09/2019   Enterocolitis due to Clostridium difficile, not specified as recurrent 08/09/2018   admission   Epilepsy, unspecified, not intractable, without status epilepticus ([place])    ESRD on hemodialysis ([place]) 12/27/2018   Failure to thrive in adult 01/21/2019   Fall 01/21/2018   Fever 06/18/2018   Fracture of left tibia 01/21/2018   Fracture of left ulna 01/21/2018   GERD (gastroesophageal reflux disease) 12/08/2016   Gout, unspecified    Gram negative sepsis ([place]) 12/27/2018   HCAP (healthcare-associated pneumonia) 04/21/2018   Heart failure, unspecified ([place])    History of Clostridioides difficile colitis 12/27/2018   HTN (hypertension) 03/08/2018   Formatting of this note might be different from the original. Last Assessment & Plan:  Patient on several meds as outpatient Cont hydralazine, low-dose amlodipine titrate as required Cont to monitor bp Last Assessment & Plan:  Formatting of this note might be different from the original. 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 pneumoniae) as the cause of diseases classified elsewhere 06/25/2018   admission   Leukocytosis 06/18/2018   Liver failure ([place])    Major depressive disorder, single episode, unspecified    Metabolic acidosis 24/5809   Metabolic bone disease 98/33/8250   Open wound of left foot except toes with complication 53/97/6734   Other cirrhosis of liver ([place]) 06/01/2018   Other fluid overload 04/10/2019   Other hypoglycemia 03/08/2018   type II Formatting of this note might be different from the original. Last Assessment & Plan:  Hemoglobin A1c 8 Carb controlled diet Sliding scale insulin, lantus 15 units nightly upon discharge up titrate as needed Medium carbohydrate diet at facility  Fingerstick monitoring Last Assessment & Plan:  Formatting of this note might be different from the original. Hemoglobin A1c 8 Carb controlled diet   Pain, unspecified 07/09/2018   Personal history of COVID-19 02/25/2019   Physical deconditioning 03/08/2018   Last Assessment & Plan:  Formatting of this note might be different from the original. -Patient is resident at [facility]. -PT/OT consulted will need continue PT services at facility  -ST consult Formatting of this note might be different from the original. Last Assessment & Plan:  -Patient is resident at [facility]. -PT/OT consulted will need continue PT services at facility  -ST consult   Pneumonia    Polyneuropathy, unspecified    Postprocedural hematoma of a genitourinary system organ or structure following a genitourinary system procedure 02/14/2019   Pressure injury of lower back, stage 2 ([place]) 03/08/2018   Last Assessment & Plan:  Formatting of this note might be different from the original. No discharge, clean appearing wound Continue routine wound care Formatting of this note might be different from the original. Last Assessment & Plan:  No discharge, clean appearing wound Continue routine wound care   Pressure injury of skin 04/21/2018   Protein-calorie malnutrition, severe ([place]) 01/21/2019   Respiratory failure ([place])    Retroperitoneal abscess ([place]) 03/13/2019   Secondary hyperparathyroidism of renal origin ([place]) 08/28/2018   Seizures ([place])  05/24/2018   Sepsis ([place]) 05/24/2018   Septic shock ([place]) 01/09/2019   Thrombocytopenia ([place]) 05/24/2018   Tobacco use    Trauma 01/21/2018   Type 2 diabetes mellitus with unspecified complications ([place]) 97/67/3419   Urinary calculus, unspecified 03/08/2018   Last Assessment & Plan:  Formatting of this note might be different from the original. Resolved  creatinine 7.3. on admission Most likely ATN-urine output now appears to stabilized Baseline  2.5-3 in June of 2019 according to OSH records No recent  baseline available Nephro consulted patient started on HD via perm cath which he pulled 2/19 Cr improved Cont flomax renaly dose all meds Monitor cr and   Vitamin D deficiency      Significant Hospital Events: Including procedures, antibiotic start and stop dates in addition to other pertinent events   10/11/2021>> Admission to [facility] ED 10/11/2021 >> Emergent HD for K of > 7.5  Interim History / Subjective:  SB 50-60 with wide QRS complex. CXR with vascular congestion but pt is saturating at 100% on RA. Some crackles per bases.  Currently on his way to HD for emergent HD, plan is to spin first hour and pull fluid thereafter as patient BP tolerates.   Objective   Blood pressure (!) 100/51, pulse (!) 50, temperature (!) 97.5 F (36.4 C), temperature source Oral, resp. rate 14, height 6' 2"  (1.88 m), weight 101.6 kg, SpO2 99 %.       No intake or output data in the 24 hours ending 10/11/21 0917 Filed Weights   10/11/21 0557  Weight: 101.6 kg    Examination: General: Sleepy, but arouses easily, in NAD, Tele with wide complex bradycardia HENT: Thick neck , No LAD . Slight JVD Lungs:  Bilateral chest excursion, rales per right and left bases, No rhonchi or wheeze Cardiovascular:  SB per tele with wide complex QRS, BP soft, S1, S2, No RMG Abdomen:  Soft, NT, ND, BS +, Body mass index is 32.83 kg/m.  Extremities:   Right Graft with + Bruit and Thrill, no obvious deformities, dry flaky skin per shins Neuro:  Groggy, but easily aroused, MAE x 4, A&O x 3.Follows commands GU:  Not assessed  Resolved Hospital Problem list     Assessment & Plan:  ESRD Hyperkalemia  K > 7.5 with Wide complex bradycardia Received Calcium gluconate 3 grams,  insulin/ glucose/ bicarb, nebulized albuterol and po lokelma in ED. Plan Emergent HD per Renal, appreciate assistance EKG in am and prn Follow up BMET post HD Trend BMET, Calcium and mag Resume MWF home HD regimen Hold home tegretol for now, resume  as soon as bradycardia resolved Hold home Flomax for now   Pulmonary Vascular Congestion per CXR Sats are 100% on RA Plan HD as above  Oxygen as needed to maintain sats at > 92% Follow up CXR in am 9/19 Combivent Q 6 prn shortness of breath  Soft BP Recent R heel procedure >> Afebrile and normal WBC Plan Blood Cx x 2 Check  lactate Check  PCT May start Levophed if needed to maintain MAP > 65 Wound care to right heel as ordered>> Santyl ointment  Monitor fever curve and WBC   DM Plan  CBG Q 4 SSI Resume home regimen once as he improves, ( Insulin Glargine 25 units before bed, and Tradjenta)  Seizure History Plan  Keppra as ordered  Will hold Tegretol while brady, but will need to be restarted as soon as EKG stabilizes Seizure precautions   Once  patient has had HD, will re-evaluate need for ICU vs renal floor.   Best Practice (right click and "Reselect all SmartList Selections" daily)   Diet/type: Regular consistency (see orders) DVT prophylaxis: SCD GI prophylaxis: H2B Lines: N/A Foley:  N/A Code Status:  full code Last date of multidisciplinary goals of care discussion [Pt. Updated at bedside in ED by Dr. Tacy Learn 9/18]  Labs   CBC: Recent Labs  Lab 10/11/21 0625  WBC 8.8  NEUTROABS 6.0  HGB 9.5*  HCT 29.7*  MCV 111.7*  PLT 84*    Basic Metabolic Panel: Recent Labs  Lab 10/11/21 0625  NA 139  K >7.5*  CL 111  CO2 8*  GLUCOSE 323*  BUN 87*  CREATININE 14.94*  CALCIUM 7.0*  MG 3.2*   GFR: Estimated Creatinine Clearance: 7.4 mL/min (A) (by C-G formula based on SCr of 14.94 mg/dL (H)). Recent Labs  Lab 10/11/21 0625  WBC 8.8    Liver Function Tests: Recent Labs  Lab 10/11/21 0625  AST 18  ALT 15  ALKPHOS 115  BILITOT 0.7  PROT 6.2*  ALBUMIN 2.8*   Recent Labs  Lab 10/11/21 0625  LIPASE 19   No results for input(s): "AMMONIA" in the last 168 hours.  ABG    Component Value Date/Time   PHART 7.246 (L) 04/28/2021 0442    PCO2ART 42.3 04/28/2021 0442   PO2ART 90 04/28/2021 0442   HCO3 18.4 (L) 04/28/2021 0442   TCO2 20 (L) 04/28/2021 0442   ACIDBASEDEF 8.0 (H) 04/28/2021 0442   O2SAT 95 04/28/2021 0442     Coagulation Profile: No results for input(s): "INR", "PROTIME" in the last 168 hours.  Cardiac Enzymes: No results for input(s): "CKTOTAL", "CKMB", "CKMBINDEX", "TROPONINI" in the last 168 hours.  HbA1C: Hgb A1c MFr Bld  Date/Time Value Ref Range Status  04/28/2021 12:02 PM 9.0 (H) 4.8 - 5.6 % Final    Comment:    (NOTE) Pre diabetes:          5.7%-6.4%  Diabetes:              >6.4%  Glycemic control for   <7.0% adults with diabetes   01/21/2021 11:19 PM 8.6 (H) 4.8 - 5.6 % Final    Comment:    (NOTE) Pre diabetes:          5.7%-6.4%  Diabetes:              >6.4%  Glycemic control for   <7.0% adults with diabetes     CBG: Recent Labs  Lab 10/11/21 0635  GLUCAP 311*    Review of Systems:   + for nausea, diarrhea, fatigue  Past Medical History:  He,  has a past medical history of Abscess of scrotum (09/19/2014), Acute encephalopathy, Allergy, unspecified, initial encounter (04/06/2019), Altered mental status, unspecified (04/21/2018), Anemia (12/08/2016), Anemia due to chronic kidney disease (06/18/2018), Anemia due to chronic kidney disease, on chronic dialysis ([place]) (04/07/2018), Anemia in chronic kidney disease (08/28/2018), Anemia in ESRD (end-stage renal disease) ([place]) (06/18/2018), Anxiety disorder, unspecified, Axillary abscess (02/03/2014), Bacteremia due to Klebsiella pneumoniae (06/19/2018), Benign essential HTN (05/24/2018), Benign hypertension with chronic kidney disease, stage III ([place]) (06/22/2016), Benign prostatic hyperplasia without lower urinary tract symptoms (05/24/2018), Bradycardia, Cellulitis of toe of right foot (09/19/2014), Chest pain, unspecified (01/27/2020), Cirrhosis ([place]) (04/2018), Closed fracture of left upper extremity with routine healing  (03/20/2018), Closed intertrochanteric fracture of left femur ([place]) (01/21/2018), Coagulopathy ([place]) (02/06/2019), COVID-19 virus infection (01/09/2019), Cutaneous abscess of buttock, Delirium (  03/14/2018), Diabetes mellitus without complication ([place]), Diabetic ketoacidosis ([place]) (12/08/2016), Diabetic ulcer of toe associated with type 2 diabetes mellitus, limited to breakdown of skin ([place]) (02/14/2017), Diarrhea, unspecified (06/08/2018), Difficult intravenous access (03/16/2018), DKA (diabetic ketoacidosis) ([place]) (08/04/2020), Dyspnea, unspecified (06/01/2018), Encounter for removal of sutures (09/15/2020), Encounter for screening for respiratory tuberculosis (06/01/2018), End stage liver disease ([place]) (01/09/2019), Enterocolitis due to Clostridium difficile, not specified as recurrent (08/09/2018), Epilepsy, unspecified, not intractable, without status epilepticus ([place]), ESRD on hemodialysis ([place]) (12/27/2018), Failure to thrive in adult (01/21/2019), Fall (01/21/2018), Fever (06/18/2018), Fracture of left tibia (01/21/2018), Fracture of left ulna (01/21/2018), GERD (gastroesophageal reflux disease) (12/08/2016), Gout, unspecified, Gram negative sepsis ([place]) (12/27/2018), HCAP (healthcare-associated pneumonia) (04/21/2018), Heart failure, unspecified ([place]), History of Clostridioides difficile colitis (12/27/2018), HTN (hypertension) (03/08/2018), Hyperosmolar non-ketotic state in patient with type 2 diabetes mellitus ([place]) (02/14/2017), Hypertension, Ichthyosis vulgaris (12/08/2016), Klebsiella pneumoniae (k. pneumoniae) as the cause of diseases classified elsewhere (06/25/2018), Leukocytosis (06/18/2018), Liver failure ([place]), Major depressive disorder, single episode, unspecified, Metabolic acidosis (21/1941), Metabolic bone disease (74/08/1446), Open wound of left foot except toes with complication (18/56/3149), Other cirrhosis of liver ([place]) (06/01/2018), Other fluid overload (04/10/2019), Other hypoglycemia  (03/08/2018), Pain, unspecified (07/09/2018), Personal history of COVID-19 (02/25/2019), Physical deconditioning (03/08/2018), Pneumonia, Polyneuropathy, unspecified, Postprocedural hematoma of a genitourinary system organ or structure following a genitourinary system procedure (02/14/2019), Pressure injury of lower back, stage 2 ([place]) (03/08/2018), Pressure injury of skin (04/21/2018), Protein-calorie malnutrition, severe ([place]) (01/21/2019), Respiratory failure ([place]), Retroperitoneal abscess ([place]) (03/13/2019), Secondary hyperparathyroidism of renal origin ([place]) (08/28/2018), Seizures ([place]) (05/24/2018), Sepsis ([place]) (05/24/2018), Septic shock ([place]) (01/09/2019), Thrombocytopenia ([place]) (05/24/2018), Tobacco use, Trauma (01/21/2018), Type 2 diabetes mellitus with unspecified complications ([place]) (70/26/3785), Urinary calculus, unspecified (03/08/2018), and Vitamin D deficiency.   Surgical History:   Past Surgical History:  Procedure Laterality Date   APPENDECTOMY  1981   AV FISTULA PLACEMENT Right 10/23/2018   Procedure: ARTERIOVENOUS (AV) FISTULA CREATION RIGHT ARM;  Surgeon: [name], MD;  Location: [place] [facility] OR;  Service: Vascular;  Laterality: Right;   CHOLECYSTECTOMY     HERNIA REPAIR  2007   IR FLUORO GUIDE CV LINE RIGHT  05/30/2018   IR FLUORO GUIDE CV LINE RIGHT  03/18/2019   IR FLUORO GUIDE CV LINE RIGHT  03/22/2019   IR REMOVAL TUN CV CATH W/O FL  03/15/2019   IR SINUS/FIST TUBE CHK-NON GI  04/04/2019   IR US GUIDE VASC ACCESS RIGHT  05/30/2018   IR US GUIDE VASC ACCESS RIGHT  03/18/2019   IR US GUIDE VASC ACCESS RIGHT  03/22/2019   NEPHRECTOMY Left 2020     Social History:   reports that he has been smoking cigarettes. He has been smoking an average of 1 pack per day. He has never used smokeless tobacco. He reports that he does not currently use alcohol. He reports that he does not use drugs.   Family History:  His family history is negative for Seizures.   Allergies Allergies  Allergen  Reactions   Fish Oil Swelling        Ethyl Acetate Swelling   Lovaza [Omega-3-Acid Ethyl Esters] Swelling     Home Medications  Prior to Admission medications   Medication Sig Start Date End Date Taking? Authorizing Provider  acetaminophen (TYLENOL) 325 MG tablet Take 650 mg by mouth every 8 (eight) hours as needed for fever (pain).   Yes [provider]  allopurinol (ZYLOPRIM) 100 MG tablet Take 100 mg by mouth daily.  08/01/18  Yes  [provider]  Calcium Carbonate Antacid (TUMS PO) Take 2 tablets by mouth daily before breakfast.    [provider]  carbamazepine (TEGRETOL XR) 100 MG 12 hr tablet Take 100 mg by mouth 2 (two) times daily. Take with 800 mg dose for a total daily dose of 900 mg twice daily    [provider]  carbamazepine (TEGRETOL XR) 400 MG 12 hr tablet Take 800 mg by mouth 2 (two) times daily. Take with 100 mg dose for a total daily dose of 900 mg twice daily    [provider]  Dextrose, Diabetic Use, (INSTA-GLUCOSE) 77.4 % GEL Take 1 Dose by mouth. Give 1 dose by mouth as needed BG less than 70, patient is arousable conscious and able to swallow. Hold all diabetic medications until provider authorizes resumption. Remain with patient. Keep pt in bed/chair for safety. Repeat blood glucose in 15 min.    [provider]  DULoxetine (CYMBALTA) 20 MG capsule Take 20 mg by mouth daily.    [provider]  Glucagon, rDNA, (GLUCAGON EMERGENCY) 1 MG KIT Inject 1 mg as directed as needed. For BG less than 70, not arousable conscious or able to swallow. If repeat blood glucose is below 70 mg/dl and pt is NOT arousable, conscious, or able to swallow, continue to all diabetic medications until provider authorizes resumption. Remain with patient. Keep pt in bed/chair for safety.    [provider]  insulin aspart (NOVOLOG) 100 UNIT/ML injection Inject 8 Units into the skin 3 (three) times daily with meals.    [provider]  insulin aspart (NOVOLOG) 100 UNIT/ML injection Inject 3-15 Units into the skin See admin instructions. 3 times daily per sliding scale 140-180 = 3 units 181-241 = 4 units  242-300 = 6 units 301-350 = 8 units 351-400 =  12 units 401-450 = 15 units    [provider]  Insulin Glargine (BASAGLAR KWIKPEN) 100 UNIT/ML Inject 25 Units into the skin at bedtime. Patient taking differently: Inject 30 Units into the skin at bedtime. 08/05/20 07/22/21  Erick Colace, NP  Ipratropium-Albuterol (COMBIVENT) 20-100 MCG/ACT AERS respimat Inhale 1 puff into the lungs every 6 (six) hours as needed for wheezing.    [provider]  ketoconazole (NIZORAL) 2 % cream Apply 1 Application topically. Tues, Fri, Sun for dry scalp    [provider]  lactulose (CHRONULAC) 10 GM/15ML solution Take 15 mLs by mouth 2 (two) times daily.    [provider]  levETIRAcetam (KEPPRA) 500 MG tablet Take 513m(1 tab) twice daily. Take 1/2 tablet (2531m right after dialysis M/W/F. (For a total of 66 tabs monthly) 07/28/21   AhMelvenia BeamMD  lidocaine-prilocaine (EMLA) cream Apply 1 Application topically.    [provider]  Multiple Vitamin (MULTIVITAMIN WITH MINERALS) TABS tablet Take 1 tablet by mouth daily.    [provider]  omeprazole (PRILOSEC) 20 MG capsule Take 20 mg by mouth daily.  07/04/18   [provider]  PHENobarbital (LUMINAL) 64.8 MG tablet Take 1 tablet (64.8 mg total) by mouth at bedtime. Take with two of the 97.2 mg tablets for total dose of 259.2 mg. Patient taking differently: Take 4 tablets by mouth at bedtime. Take with two of the 97.2 mg tablets for total dose of 259.2 mg. 04/29/21 07/22/21  AhMelvenia BeamMD  simvastatin (ZOCOR) 20 MG tablet Take 20 mg by mouth at bedtime.     [provider]  tamsulosin (FLOMAX) 0.4 MG CAPS capsule Take 0.4 mg by mouth at bedtime.    [provider]  TRADJENTA 5 MG TABS  tablet Take 5 mg by mouth daily. 08/19/19   [provider]     Critical care time: 64 minutes    Magdalen Spatz, MSN, AGACNP-BC [place] for personal pager PCCM on call pager [phone]  10/11/2021 11:08 AM

## 2021-10-11 NOTE — Progress Notes (Signed)
ESRD at [facility]. PMH: ESRD, T2DM, seizures, HTN, cirrhosis, medical non-adherence.   Notified of need for HD orders. K+ > 7.5. Last HD 08/03/2021. HD orders entered. Will use 1.0 K bath for one hour, then change to 2.0 K bath and complete HD.    HD orders: [place] MWF 4 hours 15 min 180NRe 400/500 101.3 kg 2.0 K/3.0 Ca Bath AVF -tight heparin -Hectorol 3 mcg IV TIW -Mircera 100 mcg IV q 2 weeks (last dose 09/27/2021) -Venfer 50 mg IV weekly    Juanell Fairly [facility] [place] [phone]

## 2021-10-11 NOTE — ED Notes (Signed)
Report attempted to ICU. Stated they would need to talk to they charge nurse

## 2021-10-11 NOTE — ED Provider Notes (Signed)
[name] EMERGENCY DEPARTMENT Provider Note   CSN: [id number] Arrival date & time: 10/11/21  0545     History  Chief Complaint  Patient presents with   Weakness    Eddie Davies is a 51 y.o. male.  The history is provided by the patient and the EMS personnel.  Weakness He has history of hypertension, diabetes, end-stage renal disease on hemodialysis, cirrhosis of the liver and comes in because of weakness for the last 2 days.  He states that he has also been vomiting for the last 4 days and has missed his last 2 dialysis sessions because of vomiting.  He denies fever or chills.  Denies hurting anywhere.  He denies any diarrhea.   Home Medications Prior to Admission medications   Medication Sig Start Date End Date Taking? Authorizing Provider  acetaminophen (TYLENOL) 325 MG tablet Take 650 mg by mouth every 8 (eight) hours as needed for fever (pain).    [provider]  allopurinol (ZYLOPRIM) 100 MG tablet Take 100 mg by mouth daily.  08/01/18   [provider]  Calcium Carbonate Antacid (TUMS PO) Take 2 tablets by mouth daily before breakfast.    [provider]  carbamazepine (TEGRETOL XR) 100 MG 12 hr tablet Take 100 mg by mouth 2 (two) times daily. Take with 800 mg dose for a total daily dose of 900 mg twice daily    [provider]  carbamazepine (TEGRETOL XR) 400 MG 12 hr tablet Take 800 mg by mouth 2 (two) times daily. Take with 100 mg dose for a total daily dose of 900 mg twice daily    [provider]  Dextrose, Diabetic Use, (INSTA-GLUCOSE) 77.4 % GEL Take 1 Dose by mouth. Give 1 dose by mouth as needed BG less than 70, patient is arousable conscious and able to swallow. Hold all diabetic medications until provider authorizes resumption. Remain with patient. Keep pt in bed/chair for safety. Repeat blood glucose in 15 min.    [provider]  DULoxetine (CYMBALTA) 20 MG capsule Take 20 mg by mouth  daily.    [provider]  Glucagon, rDNA, (GLUCAGON EMERGENCY) 1 MG KIT Inject 1 mg as directed as needed. For BG less than 70, not arousable conscious or able to swallow. If repeat blood glucose is below 70 mg/dl and pt is NOT arousable, conscious, or able to swallow, continue to all diabetic medications until provider authorizes resumption. Remain with patient. Keep pt in bed/chair for safety.    [provider]  insulin aspart (NOVOLOG) 100 UNIT/ML injection Inject 8 Units into the skin 3 (three) times daily with meals.    [provider]  insulin aspart (NOVOLOG) 100 UNIT/ML injection Inject 3-15 Units into the skin See admin instructions. 3 times daily per sliding scale 140-180 = 3 units 181-241 = 4 units  242-300 = 6 units 301-350 = 8 units 351-400 =  12 units 401-450 = 15 units    [provider]  Insulin Glargine (BASAGLAR KWIKPEN) 100 UNIT/ML Inject 25 Units into the skin at bedtime. Patient taking differently: Inject 30 Units into the skin at bedtime. 08/05/20 07/22/21  Erick Colace, NP  Ipratropium-Albuterol (COMBIVENT) 20-100 MCG/ACT AERS respimat Inhale 1 puff into the lungs every 6 (six) hours as needed for wheezing.    [provider]  ketoconazole (NIZORAL) 2 % cream Apply 1 Application topically. Tues, Fri, Sun for dry scalp    [provider]  lactulose ([place])  10 GM/15ML solution Take 15 mLs by mouth 2 (two) times daily.    [provider]  levETIRAcetam (KEPPRA) 500 MG tablet Take 567m(1 tab) twice daily. Take 1/2 tablet (2575m right after dialysis M/W/F. (For a total of 66 tabs monthly) 07/28/21   AhMelvenia BeamMD  lidocaine-prilocaine (EMLA) cream Apply 1 Application topically.    [provider]  Multiple Vitamin (MULTIVITAMIN WITH MINERALS) TABS tablet Take 1 tablet by mouth daily.    [provider]  omeprazole (PRILOSEC) 20 MG capsule Take 20 mg by mouth daily.  07/04/18   [provider]  PHENobarbital (LUMINAL) 64.8 MG tablet Take 1 tablet (64.8 mg total) by mouth at bedtime. Take with two of the 97.2 mg tablets for total dose of 259.2 mg. Patient taking differently: Take 4 tablets by mouth at bedtime. Take with two of the 97.2 mg tablets for total dose of 259.2 mg. 04/29/21 07/22/21  AhMelvenia BeamMD  simvastatin (ZOCOR) 20 MG tablet Take 20 mg by mouth at bedtime.     [provider]  tamsulosin (FLOMAX) 0.4 MG CAPS capsule Take 0.4 mg by mouth at bedtime.    [provider]  TRADJENTA 5 MG TABS tablet Take 5 mg by mouth daily. 08/19/19   [provider]      Allergies    Fish oil, Ethyl acetate, and Omega-3-acid ethyl esters    Review of Systems   Review of Systems  Neurological:  Positive for weakness.  All other systems reviewed and are negative.   Physical Exam Updated Vital Signs BP 100/67   Pulse (!) 49   Temp (!) 97.5 F (36.4 C) (Oral)   Resp (!) 28   Ht 6' 2" (1.88 m)   Wt 101.6 kg   SpO2 99%   BMI 28.76 kg/m  Physical Exam Vitals and nursing note reviewed.   Morbidly obese [age]ear old male, resting comfortably and in no acute distress. Vital signs are significant for glycerol low blood pressure, slow heart rate, elevated respiratory rate. Oxygen saturation is 99%, which is normal. Head is normocephalic and atraumatic. PERRLA, EOMI. Oropharynx is clear. Neck is nontender and supple without adenopathy or JVD. Back is nontender and there is no CVA tenderness. Lungs are clear without rales, wheezes, or rhonchi. Chest is nontender. Heart has regular rate and rhythm without murmur. Abdomen is soft, flat, nontender.  Peristalsis is hypoactive. Extremities have no cyanosis or edema, full range of motion is present.  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 Skin is warm and dry.  Ichthyosis present. Neurologic: Mental status is normal, cranial nerves are intact, moves all extremities  equally.  ED Results / Procedures / Treatments   Labs (all labs ordered are listed, but only abnormal results are displayed) Labs Reviewed  CBG MONITORING, ED - Abnormal; Notable for the following components:      Result Value   Glucose-Capillary 311 (*)    All other components within normal limits  COMPREHENSIVE METABOLIC PANEL  LIPASE, BLOOD  CBC WITH DIFFERENTIAL/PLATELET  MAGNESIUM  CBG MONITORING, ED    EKG EKG Interpretation  Date/Time:  Monday October 11 2021 06:03:02 EDT Ventricular Rate:  136 PR Interval:    QRS Duration: 213 QT Interval:  264 QTC Calculation: 266 R Axis:   235 Text Interpretation: Junctional rhythm Nonspecific intraventricular conduction delay Consider anterolateral infarct Nonspecific ST depression Concerning for hyperkalemia When compared with ECG of 04/28/2021 nonspecific intraventricular conduction  delay in now present concerning for hyperkalemia Confirmed by Delora Fuel ([id number]) on 10/11/2021 6:10:10 AM  Procedures .Central Line  Date/Time: 10/11/2021 6:22 AM  Performed by: Delora Fuel, MD Authorized by: Delora Fuel, MD   Consent:    Consent obtained:  Emergent situation   Consent given by:  Patient   Risks, benefits, and alternatives were discussed: yes     Risks discussed:  Incorrect placement, bleeding and infection   Alternatives discussed:  No treatment Universal protocol:    Procedure explained and questions answered to patient or proxy's satisfaction: yes     Relevant documents present and verified: yes     Required blood products, implants, devices, and special equipment available: yes     Site/side marked: yes     Immediately prior to procedure, a time out was called: yes     Patient identity confirmed:  Verbally with patient and arm band Pre-procedure details:    Indication(s): insufficient peripheral access     Hand hygiene: Hand hygiene performed prior to insertion     Skin preparation:  Chlorhexidine   Skin preparation  agent: Skin preparation agent completely dried prior to procedure   Sedation:    Sedation type:  None Anesthesia:    Anesthesia method:  None Procedure details:    Location: Left external jugular.   Patient position:  Supine   Procedural supplies:  Single lumen   Catheter size: 18 gauge.   Landmarks identified: yes     Ultrasound guidance: yes     Number of attempts:  1   Successful placement: yes   Post-procedure details:    Post-procedure:  Dressing applied   Assessment:  Blood return through all ports and free fluid flow   Procedure completion:  Tolerated well, no immediate complications   Cardiac monitor shows junctional rhythm, per my interpretation.  Medications Ordered in ED Medications  ondansetron (ZOFRAN) injection 4 mg (has no administration in time range)  sodium zirconium cyclosilicate (LOKELMA) packet 10 g (has no administration in time range)  calcium gluconate inj 10% (1 g) URGENT USE ONLY! (has no administration in time range)  albuterol (PROVENTIL) (2.5 MG/3ML) 0.083% nebulizer solution 10 mg (has no administration in time range)  sodium bicarbonate injection 50 mEq (has no administration in time range)    ED Course/ Medical Decision Making/ A&P                           Medical Decision Making  Weakness, hypotension, junctional rhythm and patient with end-stage renal disease.  I reviewed and interpreted his electrocardiogram, and there is QRS widening concerning for hyperkalemia.  I have ordered emergent treatment for hyperkalemia since I do not feel it is safe to wait for the lab results to come back.  I have ordered albuterol via nebulizer, sodium zirconium cyclosilicate orally, intravenous calcium and sodium bicarbonate.  Insulin will be ordered after CBG is obtained.  Staff was not able to establish IV access, I have inserted a left external jugular IV.  He was initially hypotensive, but blood pressure did come up with giving the above-noted treatment for  hyperkalemia.  I have reviewed the laboratory tests available at this time and my interpretation is elevated random glucose, so he was given intravenous insulin without supplemental dextrose.  QRS still looks somewhat wide on monitor but patient is stable.  Metabolic panel is still pending.  Case is signed out to Dr. Ernesto Rutherford.  CRITICAL CARE Performed by:  Delora Fuel Total critical care time: 60 minutes Critical care time was exclusive of separately billable procedures and treating other patients. Critical care was necessary to treat or prevent imminent or life-threatening deterioration. Critical care was time spent personally by me on the following activities: development of treatment plan with patient and/or surrogate as well as nursing, discussions with consultants, evaluation of patient's response to treatment, examination of patient, obtaining history from patient or surrogate, ordering and performing treatments and interventions, ordering and review of laboratory studies, ordering and review of radiographic studies, pulse oximetry and re-evaluation of patient's condition.  Final Clinical Impression(s) / ED Diagnoses Final diagnoses:  Hyperkalemia  Hypotension, unspecified hypotension type  End-stage renal disease on hemodialysis (HCC)  Nausea and vomiting, unspecified vomiting type  Weakness    Rx / DC Orders ED Discharge Orders     None         Delora Fuel, MD 77/41/28 0740

## 2021-10-11 NOTE — Consult Note (Signed)
Renal Service Consult Note [facility]  Eddie Davies 10/11/2021 Sol Blazing, MD Requesting Physician: Dr Ernesto Rutherford  Reason for Consult: ESRD pt w/ gen weakness, hyperkalemia HPI: The patient is a 51 y.o. year-old w/ hx of anemia, ESRD on HD, HTN, cirrhosis, DM2, gout, seizures who presented to ED w/ gen'd weakness. Labs showed K+ > 7.5. EKG showing severe IVCD and junctional rhythm, QRS 211 msec (prior EKG w/ normal K+ in Jan 2022 shows baseline NSR w/ IVCD w/ QRS 124 msec). In ED pt has rec'd IV Ca++/ insulin/ glucose/ bicarb, nebulized albuterol and po lokelma. BP's have come up w/ temporizing measures, and HR is stable in the 55- 60 range. Not in any distress. Asked to see for dialysis.   Pt seen in ED. Missed 2 HD last week, last HD 7 days ago. No SOB, cough or CP. No abd pain, no n/v/d. Lives in SNF in [place]. Is not walking but waiting to hear from his doctors if he can start walking soon. Had procedure on this R heel recently.   ROS - denies CP, no joint pain, no HA, no blurry vision, no rash, no diarrhea, no nausea/ vomiting   Past Medical History  Past Medical History:  Diagnosis Date   Abscess of scrotum 09/19/2014   Acute encephalopathy    Allergy, unspecified, initial encounter 04/06/2019   Altered mental status, unspecified 04/21/2018   Anemia 12/08/2016   Formatting of this note might be different from the original. Last Assessment & Plan:  Multifactorial chronic with DDx including iron deficiency, CKD, acute blood loss from self discontinuation of HD catheter.  Noted pancytopenia.  No gross blood loss in stool noticed, FOBT neg.  Transfused 1unit PRBC 2/20. - Continue oral iron - Consulted hematology-recs appreciated etiology likely hepatosplenome   Anemia due to chronic kidney disease 06/18/2018   Anemia due to chronic kidney disease, on chronic dialysis ([place]) 04/07/2018   Anemia in chronic kidney disease 08/28/2018   Anemia in ESRD (end-stage renal  disease) ([place]) 06/18/2018   Anxiety disorder, unspecified    Axillary abscess 02/03/2014   Bacteremia due to Klebsiella pneumoniae 06/19/2018   Benign essential HTN 05/24/2018   Benign hypertension with chronic kidney disease, stage III ([place]) 06/22/2016   Benign prostatic hyperplasia without lower urinary tract symptoms 05/24/2018   Bradycardia    Cellulitis of toe of right foot 09/19/2014   Chest pain, unspecified 01/27/2020   Cirrhosis ([place]) 04/2018   Closed fracture of left upper extremity with routine healing 03/20/2018   Last Assessment & Plan:  Formatting of this note might be different from the original. Unclear etiology.   --Ortho consulted on admission s/p removal of cast and application of volar/dorsal splint  --Has had arranged follow-up with Emelda Brothers NP will need to continue to follow-up for removal of cast   Closed intertrochanteric fracture of left femur ([place]) 01/21/2018   Coagulopathy ([place]) 02/06/2019   COVID-19 virus infection 01/09/2019   Cutaneous abscess of buttock    Delirium 03/14/2018   Last Assessment & Plan:  Formatting of this note might be different from the original. Multifactorial-combination of AKI, hypercarbia, non-cirrhotic hyperammonemia, hospital-acquired delirium-resolved.  Mental status now appears to be at baseline.  CT head- no acute findings: Generalized cerebral volume loss is advanced for patient's age, possible otomastoiditis correlate clinically(patient denies   Diabetes mellitus without complication ([place])    type II   Diabetic ketoacidosis ([place]) 12/08/2016   Diabetic ulcer of toe associated with type 2  diabetes mellitus, limited to breakdown of skin ([place]) 02/14/2017   Diarrhea, unspecified 06/08/2018   Difficult intravenous access 03/16/2018   Last Assessment & Plan:  Formatting of this note might be different from the original. TLC 2/21 Consent obtained from patient and his cousin Mosie Lukes   DKA (diabetic ketoacidosis) ([place]) 08/04/2020   Dyspnea,  unspecified 06/01/2018   Encounter for removal of sutures 09/15/2020   Encounter for screening for respiratory tuberculosis 06/01/2018   End stage liver disease ([place]) 01/09/2019   Enterocolitis due to Clostridium difficile, not specified as recurrent 08/09/2018   admission   Epilepsy, unspecified, not intractable, without status epilepticus ([place])    ESRD (end stage renal disease) on dialysis ([place])    MWF    ESRD on hemodialysis ([place]) 12/27/2018   Failure to thrive in adult 01/21/2019   Fall 01/21/2018   Fever 06/18/2018   Fracture of left tibia 01/21/2018   Fracture of left ulna 01/21/2018   GERD (gastroesophageal reflux disease) 12/08/2016   Gout, unspecified    Gram negative sepsis ([place]) 12/27/2018   HCAP (healthcare-associated pneumonia) 04/21/2018   Heart failure, unspecified ([place])    History of Clostridioides difficile colitis 12/27/2018   HTN (hypertension) 03/08/2018   Formatting of this note might be different from the original. Last Assessment & Plan:  Patient on several meds as outpatient Cont hydralazine, low-dose amlodipine titrate as required Cont to monitor bp Last Assessment & Plan:  Formatting of this note might be different from the original. 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 pneumoniae) as the cause of diseases classified elsewhere 06/25/2018   admission   Leukocytosis 06/18/2018   Liver failure ([place])    Major depressive disorder, single episode, unspecified    Metabolic acidosis 37/0488   Metabolic bone disease 8/91/6945   Open wound of left foot except toes with complication 03/88/8280   Other cirrhosis of liver ([place]) 06/01/2018   Other fluid overload 04/10/2019   Other hypoglycemia 03/08/2018   type II Formatting of this note might be  different from the original. Last Assessment & Plan:  Hemoglobin A1c 8 Carb controlled diet Sliding scale insulin, lantus 15 units nightly upon discharge up titrate as needed Medium carbohydrate diet at facility Fingerstick monitoring Last Assessment & Plan:  Formatting of this note might be different from the original. Hemoglobin A1c 8 Carb controlled diet   Pain, unspecified 07/09/2018   Personal history of COVID-19 02/25/2019   Physical deconditioning 03/08/2018   Last Assessment & Plan:  Formatting of this note might be different from the original. -Patient is resident at [facility]. -PT/OT consulted will need continue PT services at facility  -ST consult Formatting of this note might be different from the original. Last Assessment & Plan:  -Patient is resident at [facility]. -PT/OT consulted will need continue PT services at facility  -ST consult   Pneumonia    Polyneuropathy, unspecified    Postprocedural hematoma of a genitourinary system organ or structure following a genitourinary system procedure 02/14/2019   Pressure injury of lower back, stage 2 ([place]) 03/08/2018   Last Assessment & Plan:  Formatting of this note might be different from the original. No discharge, clean appearing wound Continue routine wound care Formatting of this note might be different from the original. Last Assessment &  Plan:  No discharge, clean appearing wound Continue routine wound care   Pressure injury of skin 04/21/2018   Protein-calorie malnutrition, severe ([place]) 01/21/2019   Respiratory failure ([place])    Retroperitoneal abscess ([place]) 03/13/2019   Secondary hyperparathyroidism of renal origin ([place]) 08/28/2018   Seizures ([place]) 05/24/2018   Sepsis ([place]) 05/24/2018   Septic shock ([place]) 01/09/2019   Thrombocytopenia ([place]) 05/24/2018   Tobacco use    Trauma 01/21/2018   Type 2 diabetes mellitus with unspecified complications ([place]) 03/31/6576   Urinary calculus, unspecified 03/08/2018   Last Assessment & Plan:  Formatting of this note  might be different from the original. Resolved  creatinine 7.3. on admission Most likely ATN-urine output now appears to stabilized Baseline  2.5-3 in June of 2019 according to OSH records No recent baseline available Nephro consulted patient started on HD via perm cath which he pulled 2/19 Cr improved Cont flomax renaly dose all meds Monitor cr and   Vitamin D deficiency    Past Surgical History  Past Surgical History:  Procedure Laterality Date   APPENDECTOMY  1981   AV FISTULA PLACEMENT Right 10/23/2018   Procedure: ARTERIOVENOUS (AV) FISTULA CREATION RIGHT ARM;  Surgeon: [name], MD;  Location: [facility];  Service: Vascular;  Laterality: Right;   CHOLECYSTECTOMY     HERNIA REPAIR  2007   IR FLUORO GUIDE CV LINE RIGHT  05/30/2018   IR FLUORO GUIDE CV LINE RIGHT  03/18/2019   IR FLUORO GUIDE CV LINE RIGHT  03/22/2019   IR REMOVAL TUN CV CATH W/O FL  03/15/2019   IR SINUS/FIST TUBE CHK-NON GI  04/04/2019   IR US GUIDE VASC ACCESS RIGHT  05/30/2018   IR US GUIDE VASC ACCESS RIGHT  03/18/2019   IR US GUIDE VASC ACCESS RIGHT  03/22/2019   NEPHRECTOMY Left 2020   Family History  Family History  Problem Relation Age of Onset   Seizures Neg Hx    Social History  reports that he has been smoking cigarettes. He has been smoking an average of 1 pack per day. He has never used smokeless tobacco. He reports that he does not currently use alcohol. He reports that he does not use drugs. Allergies  Allergies  Allergen Reactions   Fish Oil Swelling and Other (See Comments)        Ethyl Acetate    Omega-3-Acid Ethyl Esters    Home medications Prior to Admission medications   Medication Sig Start Date End Date Taking? Authorizing Provider  acetaminophen (TYLENOL) 325 MG tablet Take 650 mg by mouth every 8 (eight) hours as needed for fever (pain).   Yes [provider]  allopurinol (ZYLOPRIM) 100 MG tablet Take 100 mg by mouth daily.  08/01/18  Yes [provider]  Calcium Carbonate  Antacid (TUMS PO) Take 2 tablets by mouth daily before breakfast.    [provider]  carbamazepine (TEGRETOL XR) 100 MG 12 hr tablet Take 100 mg by mouth 2 (two) times daily. Take with 800 mg dose for a total daily dose of 900 mg twice daily    [provider]  carbamazepine (TEGRETOL XR) 400 MG 12 hr tablet Take 800 mg by mouth 2 (two) times daily. Take with 100 mg dose for a total daily dose of 900 mg twice daily    [provider]  Dextrose, Diabetic Use, (INSTA-GLUCOSE) 77.4 % GEL Take 1 Dose by mouth. Give 1 dose by mouth as needed BG less than 70, patient  is arousable conscious and able to swallow. Hold all diabetic medications until provider authorizes resumption. Remain with patient. Keep pt in bed/chair for safety. Repeat blood glucose in 15 min.    [provider]  DULoxetine (CYMBALTA) 20 MG capsule Take 20 mg by mouth daily.    [provider]  Glucagon, rDNA, (GLUCAGON EMERGENCY) 1 MG KIT Inject 1 mg as directed as needed. For BG less than 70, not arousable conscious or able to swallow. If repeat blood glucose is below 70 mg/dl and pt is NOT arousable, conscious, or able to swallow, continue to all diabetic medications until provider authorizes resumption. Remain with patient. Keep pt in bed/chair for safety.    [provider]  insulin aspart (NOVOLOG) 100 UNIT/ML injection Inject 8 Units into the skin 3 (three) times daily with meals.    [provider]  insulin aspart (NOVOLOG) 100 UNIT/ML injection Inject 3-15 Units into the skin See admin instructions. 3 times daily per sliding scale 140-180 = 3 units 181-241 = 4 units  242-300 = 6 units 301-350 = 8 units 351-400 =  12 units 401-450 = 15 units    [provider]  Insulin Glargine (BASAGLAR KWIKPEN) 100 UNIT/ML Inject 25 Units into the skin at bedtime. Patient taking differently: Inject 30 Units into the skin at bedtime. 08/05/20 07/22/21  Erick Colace, NP   Ipratropium-Albuterol (COMBIVENT) 20-100 MCG/ACT AERS respimat Inhale 1 puff into the lungs every 6 (six) hours as needed for wheezing.    [provider]  ketoconazole (NIZORAL) 2 % cream Apply 1 Application topically. Tues, Fri, Sun for dry scalp    [provider]  lactulose (CHRONULAC) 10 GM/15ML solution Take 15 mLs by mouth 2 (two) times daily.    [provider]  levETIRAcetam (KEPPRA) 500 MG tablet Take 555m(1 tab) twice daily. Take 1/2 tablet (2548m right after dialysis M/W/F. (For a total of 66 tabs monthly) 07/28/21   AhMelvenia BeamMD  lidocaine-prilocaine (EMLA) cream Apply 1 Application topically.    [provider]  Multiple Vitamin (MULTIVITAMIN WITH MINERALS) TABS tablet Take 1 tablet by mouth daily.    [provider]  omeprazole (PRILOSEC) 20 MG capsule Take 20 mg by mouth daily.  07/04/18   [provider]  PHENobarbital (LUMINAL) 64.8 MG tablet Take 1 tablet (64.8 mg total) by mouth at bedtime. Take with two of the 97.2 mg tablets for total dose of 259.2 mg. Patient taking differently: Take 4 tablets by mouth at bedtime. Take with two of the 97.2 mg tablets for total dose of 259.2 mg. 04/29/21 07/22/21  AhMelvenia BeamMD  simvastatin (ZOCOR) 20 MG tablet Take 20 mg by mouth at bedtime.     [provider]  tamsulosin (FLOMAX) 0.4 MG CAPS capsule Take 0.4 mg by mouth at bedtime.    [provider]  TRADJENTA 5 MG TABS tablet Take 5 mg by mouth daily. 08/19/19   [provider]     Vitals:   10/11/21 0730 10/11/21 0745 10/11/21 0800 10/11/21 0830  BP: (!) 97/42 (!) 109/55 (!) 111/56 (!) 100/51  Pulse: (!) 48 (!) 50 (!) 51 (!) 50  Resp: 20 (!) 27 20 14   Temp:      TempSrc:      SpO2: 100% 100% 97% 99%  Weight:      Height:       Exam Gen alert, no distress Chronic skin rash c/w icthyosis Sclera anicteric, throat clear  No jvd or bruits Chest clear bilat to bases, no rales/  wheezing RRR no MRG Abd soft ntnd no mass or ascites +bs GU normal male MS no joint effusions or deformity Ext no LE or UE edema, no wounds or ulcers Neuro is alert, Ox 3 , nf    RUA AVF    Home meds include - allopurinol, insulin glargine/ aspart, tamsulosin, carbamazepine, duloxetine, combivent, lactulose15 cc bid, MVI, omeprazole, phenobarbital, simvastatin, tradjenta, prns/ vits/ supps     OP HD: [facility] MWF 4 hours 15 min   400/500 101.3 kg   2.0 K/3.0 Ca Bath  RUA AVF  Hep tight -Hectorol 3 mcg IV TIW -Mircera 100 mcg IV q 2 weeks (last dose 09/27/2021) -Venfer 50 mg IV weekly    Na 139 K > 7.5  CO2 8  BUN 87  Cr 14.9     Assessment/ Plan: Severe hyperkalemia - baseline QRS is 124 msec, here it was 221 msec on presentation now down to 190 msec after 1st round of temporizing measures in ED. BP's stable and HR's 55-60, no resp issues and MS Ox 3. OK to dialyze upstairs which will be the quickest way to get his K+ down. Use low K+ bath.  ESRD - on HD MWF. Missed HD x 2, last HD 1 week ago.  Debility - lives in SNF in [place] HTN/ vol - not on any BP lowering meds. BP's low normal here. Will focus on K= lowering 1st half of HD then try to get some fluid off the 2nd half. He is not grossly overloaded, CXR is not impressive for fluid but is read as early IS edema.  MBD ckd - cont IV vdra Anemia esrd - next esa due today, will order darbe 100 ug weekly while here. Hb 9.5.  DM2 - per pmd Seizure d/o - per pmd      Kelly Splinter  MD 10/11/2021, 8:36 AM Recent Labs  Lab 10/11/21 0625  HGB 9.5*  ALBUMIN 2.8*  CALCIUM 7.0*  CREATININE 14.94*  K >7.5*

## 2021-10-11 NOTE — Progress Notes (Signed)
Pt receives out-pt HD at [facility] on MWF. Pt has an 11:30 chair time. Pt from snf per medical record and out-pt HD clinic. Will assist as needed.   Melven Sartorius Renal Navigator [phone]

## 2021-10-11 NOTE — Inpatient Diabetes Management (Signed)
Inpatient Diabetes Program Recommendations  AACE/ADA: New Consensus Statement on Inpatient Glycemic Control (2015)  Target Ranges:  Prepandial:   less than 140 mg/dL      Peak postprandial:   less than 180 mg/dL (1-2 hours)      Critically ill patients:  140 - 180 mg/dL   Lab Results  Component Value Date   GLUCAP 311 (H) 10/11/2021   HGBA1C 9.0 (H) 04/28/2021    Review of Glycemic Control  Latest Reference Range & Units 10/11/21 06:25  Glucose 70 - 99 mg/dL 323 (H)   Diabetes history: DM 2 Outpatient Diabetes medications: Basaglar 30 units qhs, Novolog 3-15 units tid + 8 units tid, Tradjenta 5 mg Daily Current orders for Inpatient glycemic control:  None  Inpatient Diabetes Program Recommendations:    -  Consider Semglee 10 units -  Add Novolog 0-6 units tid  Thanks,  Tama Headings RN, MSN, BC-ADM Inpatient Diabetes Coordinator Team Pager [phone] (8a-5p)

## 2021-10-11 NOTE — Procedures (Signed)
   I was present at this dialysis session, have reviewed the session itself and made  appropriate changes Kelly Splinter MD [place] pager [phone]   10/11/2021, 10:18 AM

## 2021-10-11 NOTE — ED Notes (Signed)
Dr Ernesto Rutherford informed of potassium greater then 7.5

## 2021-10-12 ENCOUNTER — Inpatient Hospital Stay (HOSPITAL_COMMUNITY): Payer: Medicare Other

## 2021-10-12 DIAGNOSIS — E8729 Other acidosis: Secondary | ICD-10-CM

## 2021-10-12 DIAGNOSIS — J811 Chronic pulmonary edema: Secondary | ICD-10-CM

## 2021-10-12 DIAGNOSIS — G40909 Epilepsy, unspecified, not intractable, without status epilepticus: Secondary | ICD-10-CM

## 2021-10-12 DIAGNOSIS — R531 Weakness: Secondary | ICD-10-CM

## 2021-10-12 DIAGNOSIS — E875 Hyperkalemia: Secondary | ICD-10-CM | POA: Diagnosis not present

## 2021-10-12 LAB — HEPATITIS B SURFACE ANTIBODY, QUANTITATIVE: Hep B S AB Quant (Post): 18.5 m[IU]/mL (ref >9.9)

## 2021-10-12 LAB — GLUCOSE, CAPILLARY
Glucose-Capillary: 143 mg/dL — ABNORMAL HIGH (ref 70–99)
Glucose-Capillary: 181 mg/dL — ABNORMAL HIGH (ref 70–99)
Glucose-Capillary: 196 mg/dL — ABNORMAL HIGH (ref 70–99)
Glucose-Capillary: 200 mg/dL — ABNORMAL HIGH (ref 70–99)
Glucose-Capillary: 243 mg/dL — ABNORMAL HIGH (ref 70–99)
Glucose-Capillary: 313 mg/dL — ABNORMAL HIGH (ref 70–99)

## 2021-10-12 LAB — CBC
HCT: 26.3 % — ABNORMAL LOW (ref 39.0–52.0)
Hemoglobin: 9.2 g/dL — ABNORMAL LOW (ref 13.0–17.0)
MCH: 35.8 pg — ABNORMAL HIGH (ref 26.0–34.0)
MCHC: 35 g/dL (ref 30.0–36.0)
MCV: 102.3 fL — ABNORMAL HIGH (ref 80.0–100.0)
Platelets: 66 10*3/uL — ABNORMAL LOW (ref 150–400)
RBC: 2.57 MIL/uL — ABNORMAL LOW (ref 4.22–5.81)
RDW: 15.8 % — ABNORMAL HIGH (ref 11.5–15.5)
WBC: 4.7 10*3/uL (ref 4.0–10.5)
nRBC: 0 % (ref 0.0–0.2)

## 2021-10-12 LAB — MAGNESIUM: Magnesium: 2.3 mg/dL (ref 1.7–2.4)

## 2021-10-12 LAB — PHOSPHORUS: Phosphorus: 10.5 mg/dL — ABNORMAL HIGH (ref 2.5–4.6)

## 2021-10-12 MED ORDER — CHLORHEXIDINE GLUCONATE CLOTH 2 % EX PADS
6.0000 | MEDICATED_PAD | Freq: Every day | CUTANEOUS | Status: DC
  Administered 2021-10-12: 6 via TOPICAL

## 2021-10-12 MED ORDER — LOPERAMIDE HCL 2 MG PO CAPS: 2.0000 mg | ORAL_CAPSULE | Freq: Four times a day (QID) | ORAL | Status: DC | PRN

## 2021-10-12 MED ORDER — BASAGLAR KWIKPEN 100 UNIT/ML ~~LOC~~ SOPN: 10.0000 [IU] | PEN_INJECTOR | Freq: Every day | SUBCUTANEOUS | Status: DC

## 2021-10-12 MED ORDER — LOPERAMIDE HCL 2 MG PO CAPS
4.0000 mg | ORAL_CAPSULE | Freq: Once | ORAL | Status: AC
  Administered 2021-10-12: 4 mg via ORAL
  Filled 2021-10-12: qty 2

## 2021-10-12 NOTE — TOC Transition Note (Signed)
Transition of Care [facility]) - CM/SW Discharge Note   Patient Details  Name: Eddie Davies MRN: [id number] Date of Birth: [birth year]  [facility] CM/SW Contact:  Benard Halsted, LCSW Phone Number: 10/12/2021, 2:22 PM   Clinical Narrative:    Patient will DC to: [facility] Anticipated DC date: 10/12/21 Family notified: Pt notifying family Transport by: Corey Harold   Per MD patient ready for DC to [place]. RN to call report prior to discharge [phone]). RN, patient, patient's family, and facility notified of DC. Discharge Summary and FL2 sent to facility. DC packet on chart. Ambulance transport requested for patient.   CSW will sign off for now as social work intervention is no longer needed. Please consult [place] again if new needs arise.     Final next level of care: Skilled Nursing Facility Barriers to Discharge: Barriers Resolved   Patient Goals and CMS Choice     Choice offered to / list presented to : Patient  Discharge Placement   Existing PASRR number confirmed : 10/12/21          Patient chooses bed at: Other - please specify in the comment section below: ([facility]) Patient to be transferred to facility by: [facility]   Patient and family notified of of transfer: 10/12/21  Discharge Plan and Services In-house Referral: Clinical Social Work   Post Acute Care Choice: [place]                               Social Determinants of Health (SDOH) Interventions     Readmission Risk Interventions    03/19/2019    9:57 AM  Readmission Risk Prevention Plan  Transportation Screening Complete  Medication Review [employer]) Referral to Pharmacy  PCP or Specialist appointment within 3-5 days of discharge Not Complete  PCP/Specialist Appt Not Complete comments LTC SNF resident  [facility] or [place] Not Complete  HRI or Home Care Consult Pt Refusal Comments LTC SNF resident  SW Recovery Care/Counseling Consult Complete   Palliative Care Screening Not [place] Complete

## 2021-10-12 NOTE — Discharge Summary (Signed)
Physician Discharge Summary         Patient ID: Francisca Harbuck MRN: [id number] DOB/AGE: 1970-02-02 51 y.o.  Admit date: 10/11/2021 Discharge date: 10/12/2021  Discharge Diagnoses:    Active Hospital Problems   Diagnosis Date Noted   ESRD (end stage renal disease) on dialysis [facility])     Priority: 1.   Seizure disorder ([place]) 10/12/2021   Diabetes mellitus due to underlying condition with unspecified complications ([place]) 36/14/4315   Protein-calorie malnutrition, severe ([place]) 01/21/2019   End stage liver disease ([place]) 01/09/2019   Other cirrhosis of liver ([place]) 06/01/2018   Anemia due to chronic kidney disease, on chronic dialysis ([place]) 04/07/2018   Diabetic ulcer of toe associated with type 2 diabetes mellitus, limited to breakdown of skin ([place]) 02/14/2017   GERD (gastroesophageal reflux disease) 12/08/2016    Resolved Hospital Problems   Diagnosis Date Noted Date Resolved   Acute hyperkalemia 10/11/2021 10/12/2021    Priority: 1.   High anion gap metabolic acidosis 40/08/6759 10/12/2021    Priority: 1.   Pulmonary edema 10/12/2021 10/12/2021    Priority: 1.   Weakness 10/12/2021 10/12/2021    Priority: 1.   Bradycardia  10/12/2021    Priority: 1.      Discharge summary    The patient is a 51 y.o. year-old w/ hx of anemia, ESRD on HD  MWF , HTN, cirrhosis, DM2, gout, seizures who presented to ED w/ gen'd weakness. Labs showed K+ > 7.5. EKG showing severe IVCD and junctional rhythm, QRS 211 msec (prior EKG w/ normal K+ in Jan 2022 shows NSR w/ ivcd of 124 msec). In ED pt has rec'd IV Ca++/ insulin/ glucose/ bicarb, nebulized albuterol and po lokelma. Last HD was 10/04/2021. Pt. Did recently have a procedure on his right heel. He is not walking as a result. Pt. Is being admitted with plan to do emergent HD to stabilize EKG . Was admitted to [facility] service.  Hospital course:  He was admitted.  Nephrology was called, he was brought emergently to the dialysis unit where he  underwent emergent dialysis.  He tolerated this well, with a total UF removed at 3 L.  Tegretol was held d/t bradycardia. His post Dialysis potassium was 4 this morning on 9/19.  His nausea, vomiting, shortness of breath, and all symptoms he presented with had resolved.  He had improved sooner than expected, following emergent dialysis and was deemed ready for transfer back to skilled nursing facility on 9/19 with plan to resume Monday/Wednesday/Friday dialysis  Discharge Plan by Active Problems    End-stage renal disease, he has a Monday/Wednesday/Friday dialysis patient Plan Discharge back to skilled nursing facility Resume all home medications Resume M/W/F HD schedule  Borderline hypotension at baseline Plan Holding antihypertensives  Recent right heel procedure Plan Continue Santyl ointment wound care  Diabetes, insulin-dependent Plan Resume home regimen  Seizure disorder history Plan  Continue phenobarb,  Keppra, and resume Tegretol  Anemia associated with CKD Plan Continuing ESA  Physical deconditioning Plan Discharge back to SNF  Procedures   Emergent dialysis on 9/18 Culture data/antimicrobials    Blood cultures x2 were obtained, negative to date Consults   Nephrology was consulted  Discharge Exam: Blood Pressure (Abnormal) 141/83   Pulse 83   Temperature 98.1 F (36.7 C) (Oral)   Respiration (Abnormal) 21   Height 6' 2" (1.88 m)   Weight 113 kg   Oxygen Saturation 100%   Body Mass Index 31.99 kg/m   General chronically ill-appearing 51 year old  male patient resting in bed currently eating his breakfast he is in no acute distress HEENT normocephalic atraumatic no jugular venous distention appreciated Pulmonary: Clear to auscultation Cardiac: Regular rate and rhythm Abdomen: Soft nontender Extremities: Warm dry, right heel dressing intact Neuro: Awake oriented no focal deficits.  Labs at discharge   Lab Results  Component Value Date    CREATININE 9.14 (H) 10/12/2021   BUN 43 (H) 10/12/2021   NA 135 10/12/2021   K 4.0 10/12/2021   CL 100 10/12/2021   CO2 20 (L) 10/12/2021   Lab Results  Component Value Date   WBC 4.7 10/12/2021   HGB 9.2 (L) 10/12/2021   HCT 26.3 (L) 10/12/2021   MCV 102.3 (H) 10/12/2021   PLT 66 (L) 10/12/2021   Lab Results  Component Value Date   ALT 14 10/12/2021   AST 18 10/12/2021   ALKPHOS 113 10/12/2021   BILITOT 0.7 10/12/2021   Lab Results  Component Value Date   INR 1.3 (H) 08/04/2020   INR 1.2 03/18/2019   INR 1.7 (H) 03/12/2019    Current radiological studies    DG Chest Port 1 View  Result Date: 10/12/2021 CLINICAL DATA:  Encounter for pulmonary edema. EXAM: PORTABLE CHEST 1 VIEW COMPARISON:  Portable chest 04/27/2021 FINDINGS: There is mild cardiomegaly. There is central vascular distension and flow cephalization without overt edema. Scattered linear scarring or atelectasis again noted both lower lung fields. The lungs are clear of focal infiltrates and no pleural effusion is seen. The mediastinum is normally outlined. There is thoracic spondylosis. There is overlying monitor wiring. IMPRESSION: Mild cardiomegaly with central vascular prominence without overt edema. No other evidence of acute chest process. Electronically Signed   By: Telford Nab M.D.   On: 10/12/2021 06:24   ECHOCARDIOGRAM COMPLETE  Result Date: 10/11/2021    ECHOCARDIOGRAM REPORT   Patient Name:   EUGEAN ARNOTT [place] Date of Exam: 10/11/2021 Medical Rec #:  [id number]              Height:       74.0 in Accession #:    [id number]             Weight:       255.7 lb Date of Birth:  1970-08-08              BSA:          2.413 m Patient Age:    46 years               BP:           136/57 mmHg Patient Gender: M                      HR:           81 bpm. Exam Location:  Inpatient Procedure: 2D Echo, Color Doppler and Cardiac Doppler Indications:    Abnormal ECG  History:        Patient has no prior history of  Echocardiogram examinations.                 Signs/Symptoms:Chest Pain; Risk Factors:Diabetes and                 Hypertension.  Sonographer:    Memory Argue Referring Phys: [place]  1. Left ventricular ejection fraction, by estimation, is 60 to 65%. The left ventricle has normal function. The left ventricle has no regional wall motion abnormalities.  There is mild concentric left ventricular hypertrophy. Left ventricular diastolic parameters are indeterminate.  2. Right ventricular systolic function is normal. The right ventricular size is moderately enlarged. Tricuspid regurgitation signal is inadequate for assessing PA pressure.  3. The mitral valve is normal in structure. No evidence of mitral valve regurgitation. No evidence of mitral stenosis.  4. The aortic valve is tricuspid. Aortic valve regurgitation is not visualized. Aortic valve sclerosis/calcification is present, without any evidence of aortic stenosis. Aortic valve area, by VTI measures 3.68 cm. Aortic valve mean gradient measures 5.0 mmHg. Aortic valve Vmax measures 1.44 m/s.  5. Aortic dilatation noted. There is mild dilatation of the aortic root, measuring 38 mm. There is borderline dilatation of the ascending aorta, measuring 37 mm. FINDINGS  Left Ventricle: Left ventricular ejection fraction, by estimation, is 60 to 65%. The left ventricle has normal function. The left ventricle has no regional wall motion abnormalities. The left ventricular internal cavity size was normal in size. There is  mild concentric left ventricular hypertrophy. Left ventricular diastolic parameters are indeterminate. Normal left ventricular filling pressure. Right Ventricle: The right ventricular size is moderately enlarged. No increase in right ventricular wall thickness. Right ventricular systolic function is normal. Tricuspid regurgitation signal is inadequate for assessing PA pressure. Left Atrium: Left atrial size was normal in size. Right  Atrium: Right atrial size was normal in size. Pericardium: There is no evidence of pericardial effusion. Mitral Valve: The mitral valve is normal in structure. Mild mitral annular calcification. No evidence of mitral valve regurgitation. No evidence of mitral valve stenosis. Tricuspid Valve: The tricuspid valve is normal in structure. Tricuspid valve regurgitation is not demonstrated. No evidence of tricuspid stenosis. Aortic Valve: The aortic valve is tricuspid. Aortic valve regurgitation is not visualized. Aortic valve sclerosis/calcification is present, without any evidence of aortic stenosis. Aortic valve mean gradient measures 5.0 mmHg. Aortic valve peak gradient measures 8.3 mmHg. Aortic valve area, by VTI measures 3.68 cm. Pulmonic Valve: The pulmonic valve was normal in structure. Pulmonic valve regurgitation is trivial. No evidence of pulmonic stenosis. Aorta: Aortic dilatation noted. There is mild dilatation of the aortic root, measuring 38 mm. There is borderline dilatation of the ascending aorta, measuring 37 mm. Venous: The inferior vena cava was not well visualized. IAS/Shunts: No atrial level shunt detected by color flow Doppler.  LEFT VENTRICLE PLAX 2D LVIDd:         5.60 cm   Diastology LVIDs:         3.70 cm   LV e' medial:    6.64 cm/s LV PW:         1.30 cm   LV E/e' medial:  8.1 LV IVS:        1.30 cm   LV e' lateral:   6.09 cm/s LVOT diam:     2.50 cm   LV E/e' lateral: 8.9 LV SV:         86 LV SV Index:   36 LVOT Area:     4.91 cm  RIGHT VENTRICLE TAPSE (M-mode): 2.3 cm LEFT ATRIUM             Index        RIGHT ATRIUM           Index LA diam:        4.10 cm 1.70 cm/m   RA Area:     19.20 cm LA Vol (A2C):   69.8 ml 28.93 ml/m  RA Volume:   46.80 ml  19.40 ml/m LA Vol (A4C):   81.2 ml 33.65 ml/m LA Biplane Vol: 82.5 ml 34.19 ml/m  AORTIC VALVE AV Area (Vmax):    3.21 cm AV Area (Vmean):   3.10 cm AV Area (VTI):     3.68 cm AV Vmax:           144.00 cm/s AV Vmean:          98.800 cm/s  AV VTI:            0.235 m AV Peak Grad:      8.3 mmHg AV Mean Grad:      5.0 mmHg LVOT Vmax:         94.30 cm/s LVOT Vmean:        62.400 cm/s LVOT VTI:          0.176 m LVOT/AV VTI ratio: 0.75  AORTA Ao Root diam: 3.80 cm Ao Asc diam:  3.60 cm MITRAL VALVE MV Area (PHT): 3.31 cm    SHUNTS MV Decel Time: 229 msec    Systemic VTI:  0.18 m MV E velocity: 54.00 cm/s  Systemic Diam: 2.50 cm MV A velocity: 56.60 cm/s MV E/A ratio:  0.95 Fransico Him MD Electronically signed by Fransico Him MD Signature Date/Time: 10/11/2021/4:19:32 PM    Final     Disposition: back to SNF    Discharge disposition: 03-Skilled Nursing Facility       Discharge Instructions     Diet - low sodium heart healthy   Complete by: As directed    Renal diet   Discharge wound care:   Complete by: As directed    Continue daily santyl dressing w/ dry sterile gauze to right heel wound       Allergies as of 10/12/2021     Allergen Reactions Comments   Fish Oil Swelling    Ethyl Acetate Swelling    Lovaza [omega-3-acid Ethyl Esters] Swelling         Medication List     Take these medications    acetaminophen 325 MG tablet Commonly known as: TYLENOL Take 650 mg by mouth every 8 (eight) hours as needed for fever (pain).   allopurinol 100 MG tablet Commonly known as: ZYLOPRIM Take 100 mg by mouth daily.   Basaglar KwikPen 100 UNIT/ML Inject 10 Units into the skin daily.   calcium carbonate 500 MG chewable tablet Commonly known as: TUMS - dosed in mg elemental calcium Chew 1,000 mg by mouth 3 (three) times daily with meals.   carbamazepine 400 MG 12 hr tablet Commonly known as: TEGRETOL XR Take 800 mg by mouth 2 (two) times daily.   carbamazepine 100 MG 12 hr tablet Commonly known as: TEGRETOL XR Take 100 mg by mouth 2 (two) times daily. Take with 844m for a total dose of 9059m  cyclobenzaprine 10 MG tablet Commonly known as: FLEXERIL Take 10 mg by mouth 2 (two) times daily as needed for muscle  spasms.   DULoxetine 20 MG capsule Commonly known as: CYMBALTA Take 20 mg by mouth daily.   eucerin cream Apply 1 Application topically 2 (two) times daily. Apply to BUE and BLE topically every day and evening shift for dry skin if resident will allow.   Glucagon Emergency 1 MG Kit Inject 1 mg as directed as needed (BG < 70 (not arousable, conscious or able to swallow)).   Insta-Glucose 77.4 % Gel Take 1 Dose by mouth as needed (BG < 70 (and pt is arousable, conscious and able  to swallow)).   insulin aspart 100 UNIT/ML injection Commonly known as: novoLOG Inject 3-15 Units into the skin See admin instructions. 3 times daily per sliding scale 140-180 = 3 units 181-241 = 4 units  242-300 = 6 units 301-350 = 8 units 351-400 = 12 units 401-450 = 15 units > 450 - notify provider   Ipratropium-Albuterol 20-100 MCG/ACT Aers respimat Commonly known as: COMBIVENT Inhale 1 puff into the lungs every 6 (six) hours as needed for wheezing.   ketoconazole 2 % cream Commonly known as: NIZORAL Apply 1 Application topically See admin instructions. Apply to scalp topically every day shift on Tuesday, Friday, Sunday   lactulose 10 GM/15ML solution Commonly known as: CHRONULAC Take 10 g by mouth See admin instructions. 10 g twice daily for hepatic failure. Hold on dialysis days prior to dialysis.   levETIRAcetam 250 MG tablet Commonly known as: KEPPRA Take 250 mg by mouth See admin instructions. 250 mg every M-W-F, give after arrival back from dialysis. What changed: Another medication with the same name was changed. Make sure you understand how and when to take each.   levETIRAcetam 500 MG tablet Commonly known as: Keppra Take 562m(1 tab) twice daily. Take 1/2 tablet (2554m right after dialysis M/W/F. (For a total of 66 tabs monthly) What changed:  how much to take how to take this when to take this additional instructions   lidocaine-prilocaine cream Commonly known as: EMLA Apply  1 Application topically every Monday, Wednesday, and Friday with hemodialysis.   multivitamin with minerals Tabs tablet Take 1 tablet by mouth daily.   omeprazole 20 MG capsule Commonly known as: PRILOSEC Take 20 mg by mouth daily.   ondansetron 4 MG tablet Commonly known as: ZOFRAN Take 4 mg by mouth every 8 (eight) hours as needed for nausea.   PHENobarbital 97.2 MG tablet Commonly known as: LUMINAL Take 194.4 mg by mouth at bedtime.   PHENobarbital 64.8 MG tablet Commonly known as: LUMINAL Take 1 tablet (64.8 mg total) by mouth at bedtime. Take with two of the 97.2 mg tablets for total dose of 259.2 mg.   PROTEIN PO Take 30 mLs by mouth 2 (two) times daily. Protein liquid   Santyl 250 UNIT/GM ointment Generic drug: collagenase Apply 1 Application topically See admin instructions. Apply to right heel every evening shift for diabetic wound care and as needed for soiled, displaced dressing   simvastatin 20 MG tablet Commonly known as: ZOCOR Take 20 mg by mouth at bedtime.   tamsulosin 0.4 MG Caps capsule Commonly known as: FLOMAX Take 0.4 mg by mouth at bedtime.   Tradjenta 5 MG Tabs tablet Generic drug: linagliptin Take 5 mg by mouth daily.          Discharge Care Instructions  (From admission, onward)           Start     Ordered   10/12/21 0000  Discharge wound care:       Comments: Continue daily santyl dressing w/ dry sterile gauze to right heel wound   10/12/21 1026             Follow-up appointment   Dialysis on Wednesday  Discharge Condition:    good  Physician Statement:   The Patient was personally examined, the discharge assessment and plan has been personally reviewed and I agree with ACNP Arletha Marschke's assessment and plan. 35 minutes of time have been dedicated to discharge assessment, planning and discharge instructions.   Signed: PeClementeen Graham/19/2023, 10:26  AM   

## 2021-10-12 NOTE — Progress Notes (Signed)
D/C order noted. Contacted [facility] to advise clinic of pt's D/C today and that pt will resume care tomorrow.   Melven Sartorius Renal Navigator [phone]

## 2021-10-12 NOTE — NC FL2 (Signed)
[place] LEVEL OF CARE SCREENING TOOL     IDENTIFICATION  Patient Name: Eddie Davies Birthdate: 1970/11/01 Sex: male Admission Date (Current Location): 10/11/2021  [facility] and [place] Number:  [employer] and Address:  [facility] [place]. [facility], [place] [address], [place], [place] [address]      Provider Number: [id number]  Attending Physician Name and Address:  Thurnell Lose, MD  Relative Name and Phone Number:       Current Level of Care: Hospital Recommended Level of Care: [place] Prior Approval Number:    Date Approved/Denied:   PASRR Number: [id number]  Discharge Plan: SNF    Current Diagnoses: Patient Active Problem List   Diagnosis Date Noted   Acute hyperkalemia 10/11/2021   Diabetes mellitus due to underlying condition with unspecified complications ([place]) 29/79/8921   Acute encephalopathy    Bradycardia    Cutaneous abscess of buttock    Hyperglycemia    Hyperkalemia    Encounter for removal of sutures 09/15/2020   DKA (diabetic ketoacidosis) ([place]) 08/04/2020   Hypertension    Diabetes mellitus without complication ([place])    Tobacco use    Polyneuropathy, unspecified    Pneumonia    Major depressive disorder, single episode, unspecified    Liver failure ([place])    Heart failure, unspecified ([place])    Gout, unspecified    ESRD (end stage renal disease) on dialysis ([place])    Epilepsy, unspecified, not intractable, without status epilepticus ([place])    Anxiety disorder, unspecified    Chest pain, unspecified 01/27/2020   Other fluid overload 04/10/2019   Allergy, unspecified, initial encounter 04/06/2019   Retroperitoneal abscess ([place]) 03/13/2019   Infection due to ESBL-producing Klebsiella pneumoniae 03/13/2019   Personal history of COVID-19 02/25/2019   Postprocedural hematoma of a genitourinary system organ or structure following a genitourinary system procedure 02/14/2019   Coagulopathy ([place])  02/06/2019   Failure to thrive in adult 01/21/2019   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   Protein-calorie malnutrition, severe ([place]) 01/21/2019   End stage liver disease ([place]) 01/09/2019   COVID-19 virus infection 01/09/2019   History of ESBL Klebsiella pneumoniae infection 01/09/2019   Septic shock ([place]) 01/09/2019   ESRD on hemodialysis ([place]) 12/27/2018   Gram negative sepsis ([place]) 12/27/2018   History of Clostridioides difficile colitis 12/27/2018   Anemia in chronic kidney disease 08/28/2018   Secondary hyperparathyroidism of renal origin ([place]) 08/28/2018   Enterocolitis due to Clostridium difficile, not specified as recurrent 08/09/2018   Pain, unspecified 07/09/2018   Klebsiella pneumoniae (k. pneumoniae) as the cause of diseases classified elsewhere 06/25/2018   Bacteremia due to Klebsiella pneumoniae 06/19/2018   Fever 06/18/2018   Anemia in ESRD (end-stage renal disease) ([place]) 06/18/2018   Anemia due to chronic kidney disease 06/18/2018   Leukocytosis 06/18/2018   Diarrhea, unspecified 06/08/2018   Dyspnea, unspecified 06/01/2018   Encounter for screening for respiratory tuberculosis 06/01/2018   Other cirrhosis of liver ([place]) 06/01/2018   Seizures ([place]) 05/24/2018   Benign prostatic hyperplasia without lower urinary tract symptoms 05/24/2018   Sepsis ([place]) 05/24/2018   Benign essential HTN 05/24/2018   Thrombocytopenia ([place]) 19/41/7408   Metabolic acidosis 14/48/1856   Metabolic bone disease 31/49/7026   Iron deficiency anemia 05/11/2018   Vitamin D deficiency 05/11/2018   Cirrhosis ([place]) 04/2018   Respiratory failure ([place])    Pressure injury of skin 04/21/2018   Altered mental status, unspecified 04/21/2018   HCAP (healthcare-associated pneumonia) 04/21/2018  Anemia due to chronic kidney disease, on chronic dialysis ([place]) 04/07/2018   Closed fracture of left upper extremity with routine healing 03/20/2018   Difficult intravenous access 03/16/2018   Delirium  03/14/2018   Hyperammonemia ([place]) 03/14/2018   Hypocalcemia 03/08/2018   Urinary calculus, unspecified 03/08/2018   Physical deconditioning 03/08/2018   Pressure injury of lower back, stage 2 ([place]) 03/08/2018   HTN (hypertension) 03/08/2018   Other hypoglycemia 03/08/2018   Closed intertrochanteric fracture of left femur ([place]) 01/21/2018   Fall 01/21/2018   Fracture of left ulna 01/21/2018   Fracture of left tibia 01/21/2018   Trauma 01/21/2018   Diabetic ulcer of toe associated with type 2 diabetes mellitus, limited to breakdown of skin ([place]) 02/14/2017   Hyperosmolar non-ketotic state in patient with type 2 diabetes mellitus ([place]) 02/14/2017   Anemia 12/08/2016   Diabetic ketoacidosis ([place]) 12/08/2016   GERD (gastroesophageal reflux disease) 12/08/2016   Hyponatremia 12/08/2016   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   Open wound of left foot except toes with complication 53/66/4403   Hyperlipidemia 06/22/2016   Type 2 diabetes mellitus with unspecified complications ([place]) 47/42/5956   Benign hypertension with chronic kidney disease, stage III ([place]) 06/22/2016   Abscess of scrotum 09/19/2014   Cellulitis of toe of right foot 09/19/2014   Axillary abscess 02/03/2014   Hypokalemia 02/03/2014    Orientation RESPIRATION BLADDER Height & Weight     Self, Time, Situation, Place  Normal Continent Weight: 249 lb 1.9 oz (113 kg) Height:  6' 2"  (188 cm)  BEHAVIORAL SYMPTOMS/MOOD NEUROLOGICAL BOWEL NUTRITION STATUS      Incontinent Diet (See dc summary)  AMBULATORY STATUS COMMUNICATION OF NEEDS Skin   Extensive Assist Verbally Normal                       Personal Care Assistance Level of Assistance  Bathing, Feeding, Dressing Bathing Assistance: Maximum assistance Feeding assistance: Independent Dressing Assistance: Limited assistance     Functional Limitations Info             SPECIAL CARE FACTORS FREQUENCY                       Contractures Contractures Info:  Not present    Additional Factors Info  Code Status, Allergies, Psychotropic Code Status Info: Full Allergies Info: Fish Oil, Ethyl Acetate, Lovaza (Omega-3-acid Ethyl Esters) Psychotropic Info: Cymbalta         Current Medications (10/12/2021):  This is the current hospital active medication list Current Facility-Administered Medications  Medication Dose Route Frequency Provider Last Rate Last Admin   allopurinol (ZYLOPRIM) tablet 100 mg  100 mg Oral Daily Magdalen Spatz, NP   100 mg at 10/12/21 3875   carbamazepine (TEGRETOL XR) 12 hr tablet 100 mg  100 mg Oral BID Magdalen Spatz, NP   100 mg at 10/12/21 6433   carbamazepine (TEGRETOL XR) 12 hr tablet 800 mg  800 mg Oral BID Magdalen Spatz, NP   800 mg at 10/12/21 2951   Chlorhexidine Gluconate Cloth 2 % PADS 6 each  6 each Topical Q0600 Valentina Gu, NP   6 each at 10/12/21 8841   Chlorhexidine Gluconate Cloth 2 % PADS 6 each  6 each Topical Q0600 Janalee Dane, [name]       collagenase (SANTYL) ointment   Topical Daily Magdalen Spatz, NP   Given at 10/12/21 6606   Darbepoetin Alfa (ARANESP) injection 100 mcg  100  mcg Intravenous Q Mon-HD Roney Jaffe, MD       docusate sodium (COLACE) capsule 100 mg  100 mg Oral BID PRN Magdalen Spatz, NP       doxercalciferol (HECTOROL) injection 3 mcg  3 mcg Intravenous Q M,W,F-HD Roney Jaffe, MD       DULoxetine (CYMBALTA) DR capsule 20 mg  20 mg Oral Daily Magdalen Spatz, NP   20 mg at 10/12/21 0925   famotidine (PEPCID) tablet 20 mg  20 mg Oral Daily Magdalen Spatz, NP   20 mg at 10/12/21 0925   heparin injection 5,000 Units  5,000 Units Subcutaneous Q8H Magdalen Spatz, NP   5,000 Units at 10/12/21 0526   insulin aspart (novoLOG) injection 0-6 Units  0-6 Units Subcutaneous Q4H Magdalen Spatz, NP   1 Units at 10/12/21 3559   Ipratropium-Albuterol (COMBIVENT) respimat 1 puff  1 puff Inhalation Q6H PRN Magdalen Spatz, NP       lactulose (CHRONULAC) 10 GM/15ML solution 10 g  10 g  Oral BID Magdalen Spatz, NP   10 g at 10/12/21 7416   levETIRAcetam (KEPPRA) tablet 250 mg  250 mg Oral Q M,W,F-2000 Magdalen Spatz, NP   250 mg at 10/11/21 2127   levETIRAcetam (KEPPRA) tablet 500 mg  500 mg Oral BID Magdalen Spatz, NP   500 mg at 10/12/21 3845   loperamide (IMODIUM) capsule 2 mg  2 mg Oral Q6H PRN Thurnell Lose, MD       PHENobarbital (LUMINAL) tablet 259.2 mg  259.2 mg Oral QHS Jacky Kindle, MD   259.2 mg at 10/11/21 2207   simvastatin (ZOCOR) tablet 20 mg  20 mg Oral QHS Magdalen Spatz, NP   20 mg at 10/11/21 2126   sodium bicarbonate injection 100 mEq  100 mEq Intravenous Once Magdalen Spatz, NP         Discharge Medications: Please see discharge summary for a list of discharge medications.  Relevant Imaging Results:  Relevant Lab Results:   Additional Information SSN: [id number]. Dialysis MWF  Benard Halsted, LCSW

## 2021-10-12 NOTE — Progress Notes (Signed)
[facility] Progress Note   Subjective:   K+ improved to 4.0 this AM. Pt denies SOB, CP, dizziness, abdominal pain and nausea. Eating breakfast, reports he feels back to baseline.   Objective Vitals:   10/11/21 2000 10/12/21 0009 10/12/21 0422 10/12/21 0800  BP: (!) 141/83     Pulse: 83     Resp: (!) 21     Temp:  98.3 F (36.8 C) 98.3 F (36.8 C) 98.1 F (36.7 C)  TempSrc:  Oral Oral Oral  SpO2: 100%     Weight:      Height:       Physical Exam General: Alert male in NAD Heart: RRR, no murmurs, rubs or gallops Lungs: CTA bilaterally without wheezing, rhonchi or rales Abdomen: Soft, non-distended, +BS Extremities:hypertrophic, dry skin. No edema Dialysis Access: RUE AVF + bruit  Additional Objective Labs: Basic Metabolic Panel: Recent Labs  Lab 10/11/21 0625 10/12/21 0243  NA 139 135  K >7.5* 4.0  CL 111 100  CO2 8* 20*  GLUCOSE 323* 236*  BUN 87* 43*  CREATININE 14.94* 9.14*  CALCIUM 7.0* 7.0*  PHOS  --  10.5*   Liver Function Tests: Recent Labs  Lab 10/11/21 0625 10/12/21 0243  AST 18 18  ALT 15 14  ALKPHOS 115 113  BILITOT 0.7 0.7  PROT 6.2* 6.3*  ALBUMIN 2.8* 2.8*   Recent Labs  Lab 10/11/21 0625  LIPASE 19   CBC: Recent Labs  Lab 10/11/21 0625 10/12/21 0243  WBC 8.8 4.7  NEUTROABS 6.0  --   HGB 9.5* 9.2*  HCT 29.7* 26.3*  MCV 111.7* 102.3*  PLT 84* 66*   Blood Culture    Component Value Date/Time   SDES BLOOD LEFT ANTECUBITAL 10/11/2021 1459   SPECREQUEST  10/11/2021 1459    BOTTLES DRAWN AEROBIC AND ANAEROBIC Blood Culture adequate volume   CULT  10/11/2021 1459    NO GROWTH < 24 HOURS Performed at [facility], [place] [address], [place], [place] [address]    REPTSTATUS PENDING 10/11/2021 1459    Cardiac Enzymes: No results for input(s): "CKTOTAL", "CKMB", "CKMBINDEX", "TROPONINI" in the last 168 hours. CBG: Recent Labs  Lab 10/11/21 2029 10/12/21 0008 10/12/21 0033 10/12/21 0427 10/12/21 0821   GLUCAP 127* 143* 181* 243* 200*   Iron Studies: No results for input(s): "IRON", "TIBC", "TRANSFERRIN", "FERRITIN" in the last 72 hours. @lablastinr3 @ Studies/Results: DG Chest Port 1 View  Result Date: 10/12/2021 CLINICAL DATA:  Encounter for pulmonary edema. EXAM: PORTABLE CHEST 1 VIEW COMPARISON:  Portable chest 04/27/2021 FINDINGS: There is mild cardiomegaly. There is central vascular distension and flow cephalization without overt edema. Scattered linear scarring or atelectasis again noted both lower lung fields. The lungs are clear of focal infiltrates and no pleural effusion is seen. The mediastinum is normally outlined. There is thoracic spondylosis. There is overlying monitor wiring. IMPRESSION: Mild cardiomegaly with central vascular prominence without overt edema. No other evidence of acute chest process. Electronically Signed   By: Telford Nab M.D.   On: 10/12/2021 06:24   ECHOCARDIOGRAM COMPLETE  Result Date: 10/11/2021    ECHOCARDIOGRAM REPORT   Patient Name:   Eddie Davies Klahn Date of Exam: 10/11/2021 Medical Rec #:  [id number]              Height:       74.0 in Accession #:    [id number]             Weight:  255.7 lb Date of Birth:  [birth year]              BSA:          2.413 m Patient Age:    50 years               BP:           136/57 mmHg Patient Gender: M                      HR:           81 bpm. Exam Location:  Inpatient Procedure: 2D Echo, Color Doppler and Cardiac Doppler Indications:    Abnormal ECG  History:        Patient has no prior history of Echocardiogram examinations.                 Signs/Symptoms:Chest Pain; Risk Factors:Diabetes and                 Hypertension.  Sonographer:    Memory Argue Referring Phys: Villalba  1. Left ventricular ejection fraction, by estimation, is 60 to 65%. The left ventricle has normal function. The left ventricle has no regional wall motion abnormalities. There is mild concentric left ventricular  hypertrophy. Left ventricular diastolic parameters are indeterminate.  2. Right ventricular systolic function is normal. The right ventricular size is moderately enlarged. Tricuspid regurgitation signal is inadequate for assessing PA pressure.  3. The mitral valve is normal in structure. No evidence of mitral valve regurgitation. No evidence of mitral stenosis.  4. The aortic valve is tricuspid. Aortic valve regurgitation is not visualized. Aortic valve sclerosis/calcification is present, without any evidence of aortic stenosis. Aortic valve area, by VTI measures 3.68 cm. Aortic valve mean gradient measures 5.0 mmHg. Aortic valve Vmax measures 1.44 m/s.  5. Aortic dilatation noted. There is mild dilatation of the aortic root, measuring 38 mm. There is borderline dilatation of the ascending aorta, measuring 37 mm. FINDINGS  Left Ventricle: Left ventricular ejection fraction, by estimation, is 60 to 65%. The left ventricle has normal function. The left ventricle has no regional wall motion abnormalities. The left ventricular internal cavity size was normal in size. There is  mild concentric left ventricular hypertrophy. Left ventricular diastolic parameters are indeterminate. Normal left ventricular filling pressure. Right Ventricle: The right ventricular size is moderately enlarged. No increase in right ventricular wall thickness. Right ventricular systolic function is normal. Tricuspid regurgitation signal is inadequate for assessing PA pressure. Left Atrium: Left atrial size was normal in size. Right Atrium: Right atrial size was normal in size. Pericardium: There is no evidence of pericardial effusion. Mitral Valve: The mitral valve is normal in structure. Mild mitral annular calcification. No evidence of mitral valve regurgitation. No evidence of mitral valve stenosis. Tricuspid Valve: The tricuspid valve is normal in structure. Tricuspid valve regurgitation is not demonstrated. No evidence of tricuspid  stenosis. Aortic Valve: The aortic valve is tricuspid. Aortic valve regurgitation is not visualized. Aortic valve sclerosis/calcification is present, without any evidence of aortic stenosis. Aortic valve mean gradient measures 5.0 mmHg. Aortic valve peak gradient measures 8.3 mmHg. Aortic valve area, by VTI measures 3.68 cm. Pulmonic Valve: The pulmonic valve was normal in structure. Pulmonic valve regurgitation is trivial. No evidence of pulmonic stenosis. Aorta: Aortic dilatation noted. There is mild dilatation of the aortic root, measuring 38 mm. There is borderline dilatation of the ascending aorta, measuring 37 mm. Venous:  The inferior vena cava was not well visualized. IAS/Shunts: No atrial level shunt detected by color flow Doppler.  LEFT VENTRICLE PLAX 2D LVIDd:         5.60 cm   Diastology LVIDs:         3.70 cm   LV e' medial:    6.64 cm/s LV PW:         1.30 cm   LV E/e' medial:  8.1 LV IVS:        1.30 cm   LV e' lateral:   6.09 cm/s LVOT diam:     2.50 cm   LV E/e' lateral: 8.9 LV SV:         86 LV SV Index:   36 LVOT Area:     4.91 cm  RIGHT VENTRICLE TAPSE (M-mode): 2.3 cm LEFT ATRIUM             Index        RIGHT ATRIUM           Index LA diam:        4.10 cm 1.70 cm/m   RA Area:     19.20 cm LA Vol (A2C):   69.8 ml 28.93 ml/m  RA Volume:   46.80 ml  19.40 ml/m LA Vol (A4C):   81.2 ml 33.65 ml/m LA Biplane Vol: 82.5 ml 34.19 ml/m  AORTIC VALVE AV Area (Vmax):    3.21 cm AV Area (Vmean):   3.10 cm AV Area (VTI):     3.68 cm AV Vmax:           144.00 cm/s AV Vmean:          98.800 cm/s AV VTI:            0.235 m AV Peak Grad:      8.3 mmHg AV Mean Grad:      5.0 mmHg LVOT Vmax:         94.30 cm/s LVOT Vmean:        62.400 cm/s LVOT VTI:          0.176 m LVOT/AV VTI ratio: 0.75  AORTA Ao Root diam: 3.80 cm Ao Asc diam:  3.60 cm MITRAL VALVE MV Area (PHT): 3.31 cm    SHUNTS MV Decel Time: 229 msec    Systemic VTI:  0.18 m MV E velocity: 54.00 cm/s  Systemic Diam: 2.50 cm MV A velocity: 56.60  cm/s MV E/A ratio:  0.95 Fransico Him MD Electronically signed by Fransico Him MD Signature Date/Time: 10/11/2021/4:19:32 PM    Final    Medications:   allopurinol  100 mg Oral Daily   carbamazepine  100 mg Oral BID   carbamazepine  800 mg Oral BID   Chlorhexidine Gluconate Cloth  6 each Topical Q0600   collagenase   Topical Daily   darbepoetin (ARANESP) injection - DIALYSIS  100 mcg Intravenous Q Mon-HD   doxercalciferol  3 mcg Intravenous Q M,W,F-HD   DULoxetine  20 mg Oral Daily   famotidine  20 mg Oral Daily   heparin  5,000 Units Subcutaneous Q8H   insulin aspart  0-6 Units Subcutaneous Q4H   lactulose  10 g Oral BID   levETIRAcetam  250 mg Oral Q M,W,F-2000   levETIRAcetam  500 mg Oral BID   phenobarbital  259.2 mg Oral QHS   simvastatin  20 mg Oral QHS   sodium bicarbonate  100 mEq Intravenous Once    Outpatient Dialysis Orders: [place] MWF 4 hours  15 min   400/500 101.3 kg   2.0 K/3.0 Ca [place]  RUA AVF  Hep tight -Hectorol 3 mcg IV TIW -Mircera 100 mcg IV q 2 weeks (last dose 09/27/2021) -Venfer 50 mg IV weekly    Assessment/Plan: Severe hyperkalemia - Due to missed dialysis .baseline QRS is 124 msec, here it was 221 msec on presentation now. Received temporizing measures and urgent HD. Now resolved, K+ 4.0. ESRD - on HD MWF. Missed HD x 2, last HD 1 week ago. Resume MWF schedule Debility - lives in SNF in [place] HTN/ vol - not on any BP lowering meds. BP's low normal here. He is not grossly overloaded, CXR is not impressive for fluid but is read as early IS edema. Wide variation in weights here. 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 Continue ESA DM2 - per pmd Seizure d/o - per pmd  Anice Paganini, [name] 10/12/2021, 9:32 AM  [facility] Pager: [phone]

## 2021-10-12 NOTE — Plan of Care (Signed)
  Problem: Activity: Goal: Risk for activity intolerance will decrease Outcome: Progressing   Problem: Nutrition: Goal: Adequate nutrition will be maintained Outcome: Progressing   Problem: Coping: Goal: Level of anxiety will decrease Outcome: Progressing   Problem: Elimination: Goal: Will not experience complications related to bowel motility Outcome: Progressing   Problem: Safety: Goal: Ability to remain free from injury will improve Outcome: Progressing   Problem: Skin Integrity: Goal: Risk for impaired skin integrity will decrease Outcome: Progressing   Problem: Skin Integrity: Goal: Risk for impaired skin integrity will decrease Outcome: Progressing

## 2021-10-12 NOTE — Progress Notes (Signed)
1608: call placed to [facility] SNF, secretary transferred me to receiving nurse, call was not answered

## 2021-10-13 LAB — COMPREHENSIVE METABOLIC PANEL
ALT: 14 U/L (ref 0–44)
AST: 18 U/L (ref 15–41)
Albumin: 2.8 g/dL — ABNORMAL LOW (ref 3.5–5.0)
Alkaline Phosphatase: 113 U/L (ref 38–126)
Anion gap: 15 (ref 5–15)
BUN: 43 mg/dL — ABNORMAL HIGH (ref 6–20)
CO2: 20 mmol/L — ABNORMAL LOW (ref 22–32)
Calcium: 7 mg/dL — ABNORMAL LOW (ref 8.9–10.3)
Chloride: 100 mmol/L (ref 98–111)
Creatinine, Ser: 9.14 mg/dL — ABNORMAL HIGH (ref 0.61–1.24)
GFR, Estimated: 6 mL/min — ABNORMAL LOW (ref >60)
Glucose, Bld: 236 mg/dL — ABNORMAL HIGH (ref 70–99)
Potassium: 4 mmol/L (ref 3.5–5.1)
Sodium: 135 mmol/L (ref 135–145)
Total Bilirubin: 0.7 mg/dL (ref 0.3–1.2)
Total Protein: 6.3 g/dL — ABNORMAL LOW (ref 6.5–8.1)

## 2021-10-16 LAB — CULTURE, BLOOD (ROUTINE X 2)
Culture: NO GROWTH
Culture: NO GROWTH
Special Requests: ADEQUATE
Special Requests: ADEQUATE

## 2021-10-19 ENCOUNTER — Ambulatory Visit (INDEPENDENT_AMBULATORY_CARE_PROVIDER_SITE_OTHER): Payer: Medicare Other | Admitting: Neurology

## 2021-10-19 ENCOUNTER — Encounter: Payer: Self-pay | Admitting: Neurology

## 2021-10-19 VITALS — BP 96/56 | HR 65 | Ht 74.0 in | Wt 225.9 lb

## 2021-10-19 DIAGNOSIS — G40909 Epilepsy, unspecified, not intractable, without status epilepticus: Secondary | ICD-10-CM | POA: Diagnosis not present

## 2021-10-19 DIAGNOSIS — Z79899 Other long term (current) drug therapy: Secondary | ICD-10-CM

## 2021-10-19 MED ORDER — LEVETIRACETAM ER 500 MG PO TB24: 1500.0000 mg | ORAL_TABLET | Freq: Every evening | ORAL | 11 refills | Status: DC

## 2021-10-19 NOTE — Patient Instructions (Addendum)
Stop Keppra IR 580m twice daily Start Keppra XR 15057mat bedtime Continue Keppra IR 25056mfter dialysis 4 week follow up with Dr, camApril Mansonilepsy specialist

## 2021-10-19 NOTE — Progress Notes (Signed)
[facility]    Provider:  Dr Eddie Davies Requesting Provider: [name], MD Primary Care Provider:  [name], [name]  CC:  Hx of epilepsy  10/19/2021: 51 year old male with end-stage renal disease on hemodialysis, seizure disorder, poorly controlled diabetes with hyperglycemia. Patient was supposed to follow up a month ago, he no-showed appointment. He continue to have seizures. Most recently he was admitted 9/18 due to Severe hyperkalemia - Due to missed dialysis had emergent dialysis, per critical care notes on 9/18 he presented with generalized weakness and shortness of breath after he missed 2 dialysis session last week, in the emergency department he was noted to have serum potassium of more than 7.5 with hyperacute T wave changes and URS prolongation; Patient received calcium gluconate sodium bicarbonate and albuterol nebs and emergent dialysis.  Patient still states he is better but still having seizures, complicated patient. He is doing better on the keppra and not having as many seizures. He says the keppra "knocks him out" during the day,3-day ambulatory EEG was negative butr says he did not have a seizure while on the equipment. Marland Kitchen He reports in the last 2 months he has had 25 total which is better than prior to [name] when he says he was having 25 week. We will increase and change his Keppra to ER just at bedtime with 244m IR after each dialysis and have epilepsy specialist see him in 30 days. He is a poor historian.  Patient complains of symptoms per HPI as well as the following symptoms: seizure . Pertinent negatives and positives per HPI. All others negative   Addendum 07/28/2021: discussed with epilepsy colleagues. - Patient's phenobarb level is 7. Trileptal is in therapeutic range. Both phenobarb and trileptal are inducers which is why I think his levels are so low despire being on high doses of both(has been on these meds for decades). At this time due to  his breakthrough seizures I would like to add Keppra: Take 5070m1 tab) twice daily. Take 1/2 tablet (25065mafter dialysis M/W/F. Once he is not having breakthrough seizures we will start tapering his phenobarb by 50% monthly. We will have to see patient monthly and watch the trileptal levels to ensure he doesn't become toxic as we decrease phenobarb (he is on 900m66md trileptal) and trileptal may need to be adjusted as we titrate off of the phenobarb. Please call and explain to facility and schedule a follow up with Dr. AherJaynee Eagles30 days thanks  07/22/2021: Routine EEG did not show epileptiform activity. 72 hour eeg did not have any event but he did not have his normal seizure-like event while on the EEG. He was diagnosed with osteoporosis but declined treatment. We discussed his phenobarb today, it has been very low. Last was at 7. Given the long-term side effects of phenobarb we discussed stopping it and changing to another agent. He is on Tegretol, I would stop phenobarb(his levels are so low I do not think we need a taper) and start   Patient complains of symptoms per HPI as well as the following symptoms: seizures . Pertinent negatives and positives per HPI. All others negative   Interval history 04/13/2021: 50 y57. male here as requested by McghDamaris Davies seizures since the are of 9 months. PMHx ESRD on HD, ESBL nephrectomy, type 2 diabetes, hypertension, heart failure, urea cycle metabolism disorder, noncompliance, obstructive sleep apnea, epilepsy, morbid severe obesity with hypoventilation, muscle wasting and atrophy, polyneuropathy, abnormalities of gait and mobility, hyperlipidemia,  depression, anxiety, gout, tobacco use with recent admission for retroperitoneal abscess. Last time I saw him, he was stable an dhad been for many years ont he same medications so we did not change.    He says he is having repeated "seizures",  everything gets slow and he has a hot flash for 30 seconds. He  remembers the whole things and have a conversation. He is having them 1-2 days a week and can have them 12 times in a day. He used to have "grand mal" seizures but he calls these "mini seizures".  He says he misses his medications and has jerking on the right side, he is here with a representative from his nursing facility. He says these are seizures, he used to have generalized tonic-clonic but he is having these events regularly. The semiology sounds unusual, we discussed getting levels and getting a 72 hour eeg. Last time I saw him, he was stable an dhad been for many years ont he same medications so we did not change. Blood work today, We will send a team to hook you up for a 3-day eeg.  Patient complains of symptoms per HPI as well as the following symptoms: "mini seizures" . Pertinent negatives and positives per HPI. All others negative  CT brain 2012:  DIAGNOSIS:  Right-sided jerking   TECHNIQUE:  Axial 5 mm scans from base to vertex without intravenous contrast.   COMPARISON: None   INTERPRETATION:  Moderate atrophy for age. Negative for mass or mass effect. Negative for  extra-axial fluid collection. Negative for evidence of hemorrhage.  Changes of ethmoid sinus disease are identified bilaterally.   CONCLUSION:  Moderate atrophy for age. Changes of ethmoid sinus disease.    VGL  ___________________________________________________________  Read bySherryll Davies on May 20 2010 12:39P  [facility]  Transcribed byPatrici Davies on May 20 2010 12:39P  Signed by:  DR. Alan Davies on:  May 21 2010  1:08P Procedure Note  Eddie Mulder, MD - 07/10/2012  Formatting of this note might be different from the original.  INDICATIONS:    OTH: RT SIDE JERKING  COMMENTS:     ED 10   PROCEDURE:   DCT 0007 - CT BRAIN/HEAD WO CONT  - May 20 2010    DIAGNOSIS:  Right-sided jerking   TECHNIQUE:  Axial 5 mm scans from base to vertex without intravenous contrast.    COMPARISON: None   INTERPRETATION:  Moderate atrophy for age. Negative for mass or mass effect. Negative for  extra-axial fluid collection. Negative for evidence of hemorrhage.  Changes of ethmoid sinus disease are identified bilaterally.   CONCLUSION:  Moderate atrophy for age. Changes of ethmoid sinus disease.    HPI 04/18/2019:  Eddie Davies is a 51 y.o. male here as requested by Eddie Limbo MD for seizures. PMHx ESRD on HD, ESBL nephrectomy, type 2 diabetes, hypertension, heart failure, urea cycle metabolism disorder, noncompliance, obstructive sleep apnea, epilepsy, morbid severe obesity with hypoventilation, muscle wasting and atrophy, polyneuropathy, abnormalities of gait and mobility, hyperlipidemia, depression, anxiety, gout, tobacco use with recent admission for retroperitoneal abscess.  I reviewed Dr. Frutoso Schatz notes, there is no documentation on history of seizures in her documentation.  Patient had an EEG in March 2020 with showed a diffuse disturbance no epileptiform activity was noted(reviewed report).  I do not see any other notes from neurology in epic or in care everywhere.  He is on carbamazepine and phenobarbital but I cannot  find any  history of seizures.  He has had seizures since the age of 70 when he was diagnosed with epilepsy after reported head injury, he states he have never lost consciousness, he says he remembers them all, he is a poor historian, he feels the medications work for him. He has been on the same medications for years, he is stable. He says his medications are given to him by staff. He says his seizures are everything goes in slow motion for 30 seconds at the most but no loss of consciousness or falls or generalized activity of clonic tonic movements. E does not have anymore information, again a poor historian.   Reviewed notes, labs and imaging from outside physicians, which showed:  02/20/2019: Carbamazepine level 5.7 (8-12) Phenobarb level  9.5 (15-40)  Reviewed CT Scan head report 03/14/2018:   1. Mild image degradation due to patient motion. Within this technical constraint, no acute hemorrhage or evidence for acute large vessel territory ischemia. Although no evidence of acute infarction, mass, or hemorrhage is seen, CT is relatively insensitive for the detection of hypoxia/ischemia within the first 24-48 hours, and an MRI scan may be indicated.  2. Suggestion of asymmetrically small left cerebral hemisphere that is likely long-standing given that there is associated expansion of the diploic space of the left calvarium, particularly the parietal and occipital calvarium.  3. Asymmetric expansion of extra-axial CSF spaces along the left frontal and parietal convexity could be due to enlarged subarachnoid spaces or potentially subdural hygroma. This exerts no substantial mass effect.  4. Generalized cerebral volume loss is advanced for patient's age.  5. Near complete opacification of right mastoid air cells with trabecular thickening and sclerosis suggesting at least a component of chronic inflammation. There are fluid levels in scattered left mastoid air cells without coalescence. While nonspecific, this should be correlated for signs/symptoms of otomastoiditis.  CT Head 01/22/2018:  .  Brain: No evidence of acute large vascular territory infarct.  No mass effect, mass lesion, or hydrocephalus. No acute hemorrhage. Subtle areas of linear cortical hyperdensity in the left temporal lobe are favored to the secondary to areas of cortical necrosis associated with parenchymal volume loss.  CT of the head 05/21/2010:  INTERPRETATION: Moderate atrophy for age. Negative for mass or mass effect. Negative for  extra-axial fluid collection. Negative for evidence of hemorrhage.  Changes of ethmoid sinus disease are identified bilaterally.   CONCLUSION: Moderate atrophy for age. Changes of ethmoid sinus disease.  CT Head 09/2009:  CONCLUSION: Asymmetric left supratentorial parenchymal atrophy versus a left convexity hygroma.  Given the patient's age and presentation, recommend MRI of the brain to further characterize.     Review of Systems: Patient complains of symptoms per HPI as well as the following symptoms: weakness, seizures,numbness. Pertinent negatives and positives per HPI. All others negative.   Social History   Socioeconomic History   Marital status: Single    Spouse name: Not on file   Number of children: 0   Years of education: Not on file   Highest education level: High school graduate  Occupational History   Not on file  Tobacco Use   Smoking status: Every Day    Packs/day: 1.00    Types: Cigarettes   Smokeless tobacco: Never  Vaping Use   Vaping Use: Never used  Substance and Sexual Activity   Alcohol use: Not Currently   Drug use: Never   Sexual activity: Not on file  Other Topics Concern   Not on  file  Social History Narrative   Lives at [facility]   Left handed   Caffeine: "none hardly"   Social Determinants of Health   Financial Resource Strain: Not on file  Food Insecurity: Not on file  Transportation Needs: Not on file  Physical Activity: Not on file  Stress: Not on file  Social Connections: Not on file  Intimate Partner Violence: Not on file    Family History  Problem Relation Age of Onset   Seizures Neg Hx     Past Medical History:  Diagnosis Date   Abscess of scrotum 09/19/2014   Acute encephalopathy    Allergy, unspecified, initial encounter 04/06/2019   Altered mental status, unspecified 04/21/2018   Anemia 12/08/2016   Formatting of this note might be different from the original. Last Assessment & Plan:  Multifactorial chronic with DDx including iron deficiency, CKD, acute blood loss from self discontinuation of HD catheter.  Noted pancytopenia.  No gross blood loss in stool noticed, FOBT neg.  Transfused 1unit PRBC 2/20. - Continue oral iron -  Consulted hematology-recs appreciated etiology likely hepatosplenome   Anemia due to chronic kidney disease 06/18/2018   Anemia due to chronic kidney disease, on chronic dialysis ([place]) 04/07/2018   Anemia in chronic kidney disease 08/28/2018   Anemia in ESRD (end-stage renal disease) ([place]) 06/18/2018   Anxiety disorder, unspecified    Axillary abscess 02/03/2014   Bacteremia due to Klebsiella pneumoniae 06/19/2018   Benign essential HTN 05/24/2018   Benign hypertension with chronic kidney disease, stage III ([place]) 06/22/2016   Benign prostatic hyperplasia without lower urinary tract symptoms 05/24/2018   Bradycardia    Cellulitis of toe of right foot 09/19/2014   Chest pain, unspecified 01/27/2020   Cirrhosis ([place]) 04/2018   Closed fracture of left upper extremity with routine healing 03/20/2018   Last Assessment & Plan:  Formatting of this note might be different from the original. Unclear etiology.   --Ortho consulted on admission s/p removal of cast and application of volar/dorsal splint  --Has had arranged follow-up with Emelda Brothers NP will need to continue to follow-up for removal of cast   Closed intertrochanteric fracture of left femur ([place]) 01/21/2018   Coagulopathy ([place]) 02/06/2019   COVID-19 virus infection 01/09/2019   Cutaneous abscess of buttock    Delirium 03/14/2018   Last Assessment & Plan:  Formatting of this note might be different from the original. Multifactorial-combination of AKI, hypercarbia, non-cirrhotic hyperammonemia, hospital-acquired delirium-resolved.  Mental status now appears to be at baseline.  CT head- no acute findings: Generalized cerebral volume loss is advanced for patient's age, possible otomastoiditis correlate clinically(patient denies   Diabetes mellitus without complication ([place])    type II   Diabetic ketoacidosis ([place]) 12/08/2016   Diabetic ulcer of toe associated with type 2 diabetes mellitus, limited to breakdown of skin ([place]) 02/14/2017    Diarrhea, unspecified 06/08/2018   Difficult intravenous access 03/16/2018   Last Assessment & Plan:  Formatting of this note might be different from the original. TLC 2/21 Consent obtained from patient and his cousin Mosie Lukes   DKA (diabetic ketoacidosis) ([place]) 08/04/2020   Dyspnea, unspecified 06/01/2018   Encounter for removal of sutures 09/15/2020   Encounter for screening for respiratory tuberculosis 06/01/2018   End stage liver disease ([place]) 01/09/2019   Enterocolitis due to Clostridium difficile, not specified as recurrent 08/09/2018   admission   Epilepsy, unspecified, not intractable, without status epilepticus ([place])    ESRD on hemodialysis ([place]) 12/27/2018  Failure to thrive in adult 01/21/2019   Fall 01/21/2018   Fever 06/18/2018   Fracture of left tibia 01/21/2018   Fracture of left ulna 01/21/2018   GERD (gastroesophageal reflux disease) 12/08/2016   Gout, unspecified    Gram negative sepsis ([place]) 12/27/2018   HCAP (healthcare-associated pneumonia) 04/21/2018   Heart failure, unspecified ([place])    History of Clostridioides difficile colitis 12/27/2018   HTN (hypertension) 03/08/2018   Formatting of this note might be different from the original. Last Assessment & Plan:  Patient on several meds as outpatient Cont hydralazine, low-dose amlodipine titrate as required Cont to monitor bp Last Assessment & Plan:  Formatting of this note might be different from the original. 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 pneumoniae) as the cause of diseases classified elsewhere 06/25/2018   admission   Leukocytosis 06/18/2018   Liver failure (HCC)    Major depressive disorder, single episode, unspecified    Metabolic acidosis 76/2263    Metabolic bone disease 33/54/5625   Open wound of left foot except toes with complication 63/89/3734   Other cirrhosis of liver ([place]) 06/01/2018   Other fluid overload 04/10/2019   Other hypoglycemia 03/08/2018   type II Formatting of this note might be different from the original. Last Assessment & Plan:  Hemoglobin A1c 8 Carb controlled diet Sliding scale insulin, lantus 15 units nightly upon discharge up titrate as needed Medium carbohydrate diet at facility Fingerstick monitoring Last Assessment & Plan:  Formatting of this note might be different from the original. Hemoglobin A1c 8 Carb controlled diet   Pain, unspecified 07/09/2018   Personal history of COVID-19 02/25/2019   Physical deconditioning 03/08/2018   Last Assessment & Plan:  Formatting of this note might be different from the original. -Patient is resident at [facility]. -PT/OT consulted will need continue PT services at facility  -ST consult Formatting of this note might be different from the original. Last Assessment & Plan:  -Patient is resident at [facility]. -PT/OT consulted will need continue PT services at facility  -ST consult   Pneumonia    Polyneuropathy, unspecified    Postprocedural hematoma of a genitourinary system organ or structure following a genitourinary system procedure 02/14/2019   Pressure injury of lower back, stage 2 ([place]) 03/08/2018   Last Assessment & Plan:  Formatting of this note might be different from the original. No discharge, clean appearing wound Continue routine wound care Formatting of this note might be different from the original. Last Assessment & Plan:  No discharge, clean appearing wound Continue routine wound care   Pressure injury of skin 04/21/2018   Protein-calorie malnutrition, severe ([place]) 01/21/2019   Respiratory failure ([place])    Retroperitoneal abscess ([place]) 03/13/2019   Secondary hyperparathyroidism of renal origin ([place]) 08/28/2018   Seizures ([place]) 05/24/2018   Sepsis ([place]) 05/24/2018   Septic  shock ([place]) 01/09/2019   Thrombocytopenia ([place]) 05/24/2018   Tobacco use    Trauma 01/21/2018   Type 2 diabetes mellitus with unspecified complications ([place]) 28/76/8115   Urinary calculus, unspecified 03/08/2018   Last Assessment & Plan:  Formatting of this note might be different from the original. Resolved  creatinine 7.3. on admission Most likely ATN-urine output now appears to stabilized Baseline  2.5-3 in June of 2019 according to OSH  records No recent baseline available Nephro consulted patient started on HD via perm cath which he pulled 2/19 Cr improved Cont flomax renaly dose all meds Monitor cr and   Vitamin D deficiency     Patient Active Problem List   Diagnosis Date Noted   Seizure disorder ([place]) 10/12/2021   Diabetes mellitus due to underlying condition with unspecified complications ([place]) 52/84/1324   Acute encephalopathy    Cutaneous abscess of buttock    Hyperglycemia    Hyperkalemia    Encounter for removal of sutures 09/15/2020   DKA (diabetic ketoacidosis) ([place]) 08/04/2020   Hypertension    Diabetes mellitus without complication (HCC)    Tobacco use    Polyneuropathy, unspecified    Pneumonia    Major depressive disorder, single episode, unspecified    Liver failure ([place])    Heart failure, unspecified ([place])    Gout, unspecified    ESRD (end stage renal disease) on dialysis ([place])    Epilepsy, unspecified, not intractable, without status epilepticus ([place])    Anxiety disorder, unspecified    Chest pain, unspecified 01/27/2020   Other fluid overload 04/10/2019   Allergy, unspecified, initial encounter 04/06/2019   Retroperitoneal abscess ([place]) 03/13/2019   Infection due to ESBL-producing Klebsiella pneumoniae 03/13/2019   Personal history of COVID-19 02/25/2019   Postprocedural hematoma of a genitourinary system organ or structure following a genitourinary system procedure 02/14/2019   Coagulopathy ([place]) 02/06/2019   Failure to thrive in adult 01/21/2019    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   Protein-calorie malnutrition, severe ([place]) 01/21/2019   End stage liver disease ([place]) 01/09/2019   COVID-19 virus infection 01/09/2019   History of ESBL Klebsiella pneumoniae infection 01/09/2019   Septic shock ([place]) 01/09/2019   ESRD on hemodialysis ([place]) 12/27/2018   Gram negative sepsis ([place]) 12/27/2018   History of Clostridioides difficile colitis 12/27/2018   Anemia in chronic kidney disease 08/28/2018   Secondary hyperparathyroidism of renal origin ([place]) 08/28/2018   Enterocolitis due to Clostridium difficile, not specified as recurrent 08/09/2018   Pain, unspecified 07/09/2018   Klebsiella pneumoniae (k. pneumoniae) as the cause of diseases classified elsewhere 06/25/2018   Bacteremia due to Klebsiella pneumoniae 06/19/2018   Fever 06/18/2018   Anemia in ESRD (end-stage renal disease) ([place]) 06/18/2018   Anemia due to chronic kidney disease 06/18/2018   Leukocytosis 06/18/2018   Diarrhea, unspecified 06/08/2018   Dyspnea, unspecified 06/01/2018   Encounter for screening for respiratory tuberculosis 06/01/2018   Other cirrhosis of liver ([place]) 06/01/2018   Seizures ([place]) 05/24/2018   Benign prostatic hyperplasia without lower urinary tract symptoms 05/24/2018   Sepsis ([place]) 05/24/2018   Benign essential HTN 05/24/2018   Thrombocytopenia ([place]) 40/10/2723   Metabolic acidosis 36/64/4034   Metabolic bone disease 74/25/9563   Iron deficiency anemia 05/11/2018   Vitamin D deficiency 05/11/2018   Cirrhosis ([place]) 04/2018   Respiratory failure ([place])    Pressure injury of skin 04/21/2018   Altered mental status, unspecified 04/21/2018   HCAP (healthcare-associated pneumonia) 04/21/2018   Anemia due to chronic kidney disease, on chronic dialysis ([place]) 04/07/2018   Closed fracture of left upper extremity with routine healing 03/20/2018   Difficult intravenous access 03/16/2018   Delirium 03/14/2018   Hyperammonemia ([place]) 03/14/2018    Hypocalcemia 03/08/2018   Urinary calculus, unspecified 03/08/2018   Physical deconditioning 03/08/2018   Pressure injury of lower back, stage 2 ([place]) 03/08/2018   HTN (hypertension) 03/08/2018   Other hypoglycemia 03/08/2018   Closed intertrochanteric fracture of left femur (  [place]) 01/21/2018   Fall 01/21/2018   Fracture of left ulna 01/21/2018   Fracture of left tibia 01/21/2018   Trauma 01/21/2018   Diabetic ulcer of toe associated with type 2 diabetes mellitus, limited to breakdown of skin ([place]) 02/14/2017   Hyperosmolar non-ketotic state in patient with type 2 diabetes mellitus ([place]) 02/14/2017   Anemia 12/08/2016   Diabetic ketoacidosis ([place]) 12/08/2016   GERD (gastroesophageal reflux disease) 12/08/2016   Hyponatremia 12/08/2016   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   Open wound of left foot except toes with complication 40/81/4481   Hyperlipidemia 06/22/2016   Type 2 diabetes mellitus with unspecified complications ([place]) 85/63/1497   Benign hypertension with chronic kidney disease, stage III ([place]) 06/22/2016   Abscess of scrotum 09/19/2014   Cellulitis of toe of right foot 09/19/2014   Axillary abscess 02/03/2014   Hypokalemia 02/03/2014    Past Surgical History:  Procedure Laterality Date   APPENDECTOMY  1981   AV FISTULA PLACEMENT Right 10/23/2018   Procedure: ARTERIOVENOUS (AV) FISTULA CREATION RIGHT ARM;  Surgeon: [name], MD;  Location: [facility];  Service: Vascular;  Laterality: Right;   CHOLECYSTECTOMY     HERNIA REPAIR  2007   IR FLUORO GUIDE CV LINE RIGHT  05/30/2018   IR FLUORO GUIDE CV LINE RIGHT  03/18/2019   IR FLUORO GUIDE CV LINE RIGHT  03/22/2019   IR REMOVAL TUN CV CATH W/O FL  03/15/2019   IR SINUS/FIST TUBE CHK-NON GI  04/04/2019   IR US GUIDE VASC ACCESS RIGHT  05/30/2018   IR US GUIDE VASC ACCESS RIGHT  03/18/2019   IR US GUIDE VASC ACCESS RIGHT  03/22/2019   NEPHRECTOMY Left 2020    Current Outpatient Medications  Medication Sig Dispense Refill    acetaminophen (TYLENOL) 325 MG tablet Take 650 mg by mouth every 8 (eight) hours as needed for fever (pain).     allopurinol (ZYLOPRIM) 100 MG tablet Take 100 mg by mouth daily.      calcium carbonate (TUMS - DOSED IN MG ELEMENTAL CALCIUM) 500 MG chewable tablet Chew 1,000 mg by mouth 3 (three) times daily with meals.     carbamazepine (TEGRETOL XR) 100 MG 12 hr tablet Take 100 mg by mouth 2 (two) times daily. Take with 86m for a total dose of 9063m    carbamazepine (TEGRETOL XR) 400 MG 12 hr tablet Take 800 mg by mouth 2 (two) times daily.     collagenase (SANTYL) 250 UNIT/GM ointment Apply 1 Application topically See admin instructions. Apply to right heel every evening shift for diabetic wound care and as needed for soiled, displaced dressing     cyclobenzaprine (FLEXERIL) 10 MG tablet Take 10 mg by mouth 2 (two) times daily as needed for muscle spasms.     Dextrose, Diabetic Use, (INSTA-GLUCOSE) 77.4 % GEL Take 1 Dose by mouth as needed (BG < 70 (and pt is arousable, conscious and able to swallow)).     DULoxetine (CYMBALTA) 20 MG capsule Take 20 mg by mouth daily.     Glucagon, rDNA, (GLUCAGON EMERGENCY) 1 MG KIT Inject 1 mg as directed as needed (BG < 70 (not arousable, conscious or able to swallow)).     insulin aspart (NOVOLOG) 100 UNIT/ML injection Inject 3-15 Units into the skin See admin instructions. 3 times daily per sliding scale 140-180 = 3 units 181-241 = 4 units  242-300 = 6 units 301-350 = 8 units 351-400 = 12 units 401-450 = 15 units > 450 -  notify provider     Insulin Glargine (BASAGLAR KWIKPEN) 100 UNIT/ML Inject 10 Units into the skin daily.     Ipratropium-Albuterol (COMBIVENT) 20-100 MCG/ACT AERS respimat Inhale 1 puff into the lungs every 6 (six) hours as needed for wheezing.     ketoconazole (NIZORAL) 2 % cream Apply 1 Application topically See admin instructions. Apply to scalp topically every day shift on Tuesday, Friday, Sunday     lactulose (CHRONULAC) 10 GM/15ML  solution Take 10 g by mouth See admin instructions. 10 g twice daily for hepatic failure. Hold on dialysis days prior to dialysis.     levETIRAcetam (KEPPRA XR) 500 MG 24 hr tablet Take 3 tablets (1,500 mg total) by mouth at bedtime. 90 tablet 11   levETIRAcetam (KEPPRA) 250 MG tablet Take 250 mg by mouth See admin instructions. 250 mg every M-W-F, give after arrival back from dialysis.     lidocaine-prilocaine (EMLA) cream Apply 1 Application topically every Monday, Wednesday, and Friday with hemodialysis.     Multiple Vitamin (MULTIVITAMIN WITH MINERALS) TABS tablet Take 1 tablet by mouth daily.     omeprazole (PRILOSEC) 20 MG capsule Take 20 mg by mouth daily.      ondansetron (ZOFRAN) 4 MG tablet Take 4 mg by mouth every 8 (eight) hours as needed for nausea.     PHENobarbital (LUMINAL) 64.8 MG tablet Take 1 tablet (64.8 mg total) by mouth at bedtime. Take with two of the 97.2 mg tablets for total dose of 259.2 mg. 30 tablet 0   PHENobarbital (LUMINAL) 97.2 MG tablet Take 194.4 mg by mouth at bedtime.     PROTEIN PO Take 30 mLs by mouth 2 (two) times daily. Protein liquid     simvastatin (ZOCOR) 20 MG tablet Take 20 mg by mouth at bedtime.   3   Skin Protectants, Misc. (EUCERIN) cream Apply 1 Application topically 2 (two) times daily. Apply to BUE and BLE topically every day and evening shift for dry skin if resident will allow.     tamsulosin (FLOMAX) 0.4 MG CAPS capsule Take 0.4 mg by mouth at bedtime.     TRADJENTA 5 MG TABS tablet Take 5 mg by mouth daily.     No current facility-administered medications for this visit.    Allergies as of 10/19/2021 - Review Complete 10/19/2021  Allergen Reaction Noted   Fish oil Swelling 04/24/2013   Ethyl acetate Swelling 06/18/2019   Lovaza [omega-3-acid ethyl esters] Swelling 01/21/2021    Vitals: BP (!) 96/56   Pulse 65   Ht 6' 2"  (1.88 m)   Wt 225 lb 15 oz (102.5 kg) Comment: pt states weight  BMI 29.01 kg/m  Last Weight:  Wt Readings  from Last 1 Encounters:  10/19/21 225 lb 15 oz (102.5 kg)   Last Height:   Ht Readings from Last 1 Encounters:  10/19/21 6' 2"  (1.88 m)   Physical exam: Stable, no changes Exam: Gen: NAD, conversant, poor dentition and poorly groomed,psychomotor slowing              CV: RRR, no MRG. No Carotid Bruits. No peripheral edema, warm, nontender Eyes: Conjunctivae clear without exudates or hemorrhage  Neuro: Stable, no changes Detailed Neurologic Exam  Speech:    Speech is normal; fluent and spontaneous with normal comprehension.  Cognition:    The patient is oriented to person, place, and time;     recent and remote memory impaired;     language fluent;     normal attention,  concentration.    Impaired fund of knowledge. Cranial Nerves: Left pupil 44m and responsive. Right is 265mand sluggish. Attempted fundoscopy could not visualize. Visual fields are full to finger confrontation. Extraocular movements are intact. Trigeminal sensation is intact and the muscles of mastication are normal. The face is symmetric. The palate elevates in the midline. Hearing intact. Voice is normal. Shoulder shrug is normal. The tongue has normal motion without fasciculations.   Coordination:    No dysmetria or ataxia noted, difficulty with FTN right due to weakness  Gait:    Cannot stand unattended, wheelchair  Motor Observation:    no involuntary movements noted. Tone:    Normal muscle tone.    Posture:    Posture is normal.     Strength:  Mild weakness right arm and right leg more proximally 3+/5 prox and 4-4+distally. left hand weakness of ulnar digit 5.           Sensation: intact to LT     Reflex Exam:  DTR's:    Deep tendon reflexes in the upper and lower extremities are hyporeflexic bilaterally; absent lowers, trace biceps.   Toes:    The toes are equivocal bilaterally.   Clonus:    Clonus is absent  Assessment/Plan [age].o. male here as requested by McDamaris Hippoor seizures. PMHx  ESRD on HD, ESBL nephrectomy, type 2 diabetes, hypertension, heart failure, urea cycle metabolism disorder, noncompliance, obstructive sleep apnea, epilepsy, morbid severe obesity with hypoventilation, muscle wasting and atrophy, polyneuropathy, abnormalities of gait and mobility, hyperlipidemia, depression, anxiety, gout, tobacco use with recent admission for retroperitoneal abscess. Last time I saw him, he was stable and had been for many years on the same medications so we did not change(tegretol and phenobarb) and recently having seizures per patient however 72-hor EEG was negative may consider EMU admission. Seizures since the age of 9 89 months  10/19/2021: Patient still states he is better on the keppra but still having seizures, complicated patient. He is doing better on the keppra and not having as many seizures. He says the keppra "knocks him out" during the day,72 eeg was negative but he did not have an event during that time. He reports in the last 2 months he has had 25 total which is better than [place] to [place] he says he was having 25 week. We will increase and change his Keppra to ER just at bedtime with 25018mR after each dialysis and have epilepsy specialist see him in 30 days. He is a poor historian. Consider EMU admission inpatient  Addendum 07/28/2021: discussed with epilepsy colleagues. - Patient's phenobarb level is 7. Trileptal is in therapeutic range. Both phenobarb and trileptal are inducers which is why I think his levels are so low despire being on high doses of both(has been on these meds for decades). At this time due to his breakthrough seizures I would like to add Keppra with supplementation after dialysis M/W/F. Once he is not having breakthrough seizures we will start tapering his phenobarb by 50% monthly. We will have to see patient monthly and watch the trileptal levels to ensure he doesn't become toxic as we decrease phenobarb (he is on 900m69md trileptal) and trileptal  may need to be adjusted as we titrate off of the phenobarb.  - He says he is having repeated "seizures",  everything gets slow and he has a hot flash for 30 seconds. He remembers the whole things and have a conversation. He is having them  1-2 days a week and can have them 12 times in a day. He used to have "grand mal" seizures but he calls these "mini seizures".  He says when he misses his medications and has jerking on the right side. The semiology sounds unusual, we discussed getting levels and getting a 72 hour eeg which did not catch a seizure.  - Phenobrab level is 7 despite being on a high dose. He has osteopenia per films, may even have osteoporosis. This is a good time to try and get him off the phenobarb. We will add Keppra 569m BID with 2522msupplement after dialysis. Once he feels he no longer has seizures we will decrease the phenobarb by 50% every month until he has been titrated off.   -Patient is a poor historian, initialy he reported seizures and trileptal/phenobarb since a child and had been on current seizure medication for years. I could not find any history for his seizures.Initially I continued current dose of AEDsdespite lower levels because  was stable so we did not adjust any medications in the past. Given his breakthrough seizures and very low phenobarb levels with side effects of decreased bone mineral density this is an apportunity to change medications as above  Orders Placed This Encounter  Procedures   Phenobarbital level   Levetiracetam level   Carbamazepine level, total   Meds ordered this encounter  Medications   levETIRAcetam (KEPPRA XR) 500 MG 24 hr tablet    Sig: Take 3 tablets (1,500 mg total) by mouth at bedtime.    Dispense:  90 tablet    Refill:  11       Cc: McLattie CornsPAU[place]  McLattie CornsPA-C  AnSarina IllMD  [facility] [id number] [id number] [phone]  [phone] spent 45 minutes of face-to-face and non-face-to-face time with patient on the  1. Seizure disorder ([facility]  2. High risk medication use   3. Nonintractable epilepsy without status epilepticus, unspecified epilepsy type ([facility]   diagnosis.  This included previsit chart review, lab review, study review, order entry, electronic health record documentation, patient education on the different diagnostic and therapeutic options, counseling and coordination of care, risks and benefits of management, compliance, or risk factor reduction

## 2021-10-21 LAB — PHENOBARBITAL LEVEL: Phenobarbital, Serum: 10 ug/mL — ABNORMAL LOW (ref 15–40)

## 2021-10-21 LAB — LEVETIRACETAM LEVEL: Levetiracetam Lvl: 11 ug/mL (ref 10.0–40.0)

## 2021-10-21 LAB — CARBAMAZEPINE LEVEL, TOTAL: Carbamazepine (Tegretol), S: 5.4 ug/mL (ref 4.0–12.0)

## 2021-11-01 ENCOUNTER — Other Ambulatory Visit: Payer: Self-pay | Admitting: Neurology

## 2021-11-08 NOTE — Telephone Encounter (Signed)
Yes we will keep on Phenobarb until I see him

## 2021-11-24 ENCOUNTER — Ambulatory Visit: Payer: Medicare Other | Admitting: Neurology

## 2021-11-28 ENCOUNTER — Other Ambulatory Visit: Payer: Self-pay

## 2021-11-28 ENCOUNTER — Emergency Department (HOSPITAL_COMMUNITY)
Admission: EM | Admit: 2021-11-28 | Discharge: 2021-11-29 | Disposition: A | Discharge status: Home / Self Care | Payer: Medicare Other | Attending: Emergency Medicine | Admitting: Emergency Medicine

## 2021-11-28 ENCOUNTER — Encounter (HOSPITAL_COMMUNITY): Payer: Self-pay | Admitting: *Deleted

## 2021-11-28 ENCOUNTER — Emergency Department (HOSPITAL_COMMUNITY): Payer: Medicare Other

## 2021-11-28 DIAGNOSIS — I509 Heart failure, unspecified: Secondary | ICD-10-CM | POA: Insufficient documentation

## 2021-11-28 DIAGNOSIS — R2243 Localized swelling, mass and lump, lower limb, bilateral: Secondary | ICD-10-CM | POA: Diagnosis not present

## 2021-11-28 DIAGNOSIS — M791 Myalgia, unspecified site: Secondary | ICD-10-CM | POA: Diagnosis not present

## 2021-11-28 DIAGNOSIS — R058 Other specified cough: Secondary | ICD-10-CM | POA: Insufficient documentation

## 2021-11-28 DIAGNOSIS — I132 Hypertensive heart and chronic kidney disease with heart failure and with stage 5 chronic kidney disease, or end stage renal disease: Secondary | ICD-10-CM | POA: Insufficient documentation

## 2021-11-28 DIAGNOSIS — Z79899 Other long term (current) drug therapy: Secondary | ICD-10-CM | POA: Diagnosis not present

## 2021-11-28 DIAGNOSIS — E8721 Acute metabolic acidosis: Secondary | ICD-10-CM | POA: Diagnosis not present

## 2021-11-28 DIAGNOSIS — Z8616 Personal history of COVID-19: Secondary | ICD-10-CM | POA: Insufficient documentation

## 2021-11-28 DIAGNOSIS — R5383 Other fatigue: Secondary | ICD-10-CM | POA: Insufficient documentation

## 2021-11-28 DIAGNOSIS — Z794 Long term (current) use of insulin: Secondary | ICD-10-CM | POA: Diagnosis not present

## 2021-11-28 DIAGNOSIS — E872 Acidosis, unspecified: Secondary | ICD-10-CM

## 2021-11-28 DIAGNOSIS — N186 End stage renal disease: Secondary | ICD-10-CM | POA: Insufficient documentation

## 2021-11-28 DIAGNOSIS — R0981 Nasal congestion: Secondary | ICD-10-CM | POA: Insufficient documentation

## 2021-11-28 DIAGNOSIS — Z992 Dependence on renal dialysis: Secondary | ICD-10-CM | POA: Diagnosis not present

## 2021-11-28 DIAGNOSIS — E875 Hyperkalemia: Secondary | ICD-10-CM | POA: Insufficient documentation

## 2021-11-28 DIAGNOSIS — E1165 Type 2 diabetes mellitus with hyperglycemia: Secondary | ICD-10-CM | POA: Diagnosis not present

## 2021-11-28 DIAGNOSIS — Z1152 Encounter for screening for COVID-19: Secondary | ICD-10-CM | POA: Diagnosis not present

## 2021-11-28 DIAGNOSIS — R059 Cough, unspecified: Secondary | ICD-10-CM | POA: Insufficient documentation

## 2021-11-28 DIAGNOSIS — E1122 Type 2 diabetes mellitus with diabetic chronic kidney disease: Secondary | ICD-10-CM | POA: Diagnosis not present

## 2021-11-28 LAB — I-STAT VENOUS BLOOD GAS, ED
Acid-base deficit: 21 mmol/L — ABNORMAL HIGH (ref 0.0–2.0)
Acid-base deficit: 8 mmol/L — ABNORMAL HIGH (ref 0.0–2.0)
Bicarbonate: 8.1 mmol/L — ABNORMAL LOW (ref 20.0–28.0)
Bicarbonate: 17.1 mmol/L — ABNORMAL LOW (ref 20.0–28.0)
Calcium, Ion: 0.9 mmol/L — ABNORMAL LOW (ref 1.15–1.40)
Calcium, Ion: 0.93 mmol/L — ABNORMAL LOW (ref 1.15–1.40)
HCT: 28 % — ABNORMAL LOW (ref 39.0–52.0)
HCT: 25 % — ABNORMAL LOW (ref 39.0–52.0)
Hemoglobin: 9.5 g/dL — ABNORMAL LOW (ref 13.0–17.0)
Hemoglobin: 8.5 g/dL — ABNORMAL LOW (ref 13.0–17.0)
O2 Saturation: 99 %
O2 Saturation: 88 %
Potassium: 6.5 mmol/L (ref 3.5–5.1)
Potassium: 3.5 mmol/L (ref 3.5–5.1)
Sodium: 134 mmol/L — ABNORMAL LOW (ref 135–145)
Sodium: 135 mmol/L (ref 135–145)
TCO2: 9 mmol/L — ABNORMAL LOW (ref 22–32)
TCO2: 18 mmol/L — ABNORMAL LOW (ref 22–32)
pCO2, Ven: 28.8 mmHg — ABNORMAL LOW (ref 44–60)
pCO2, Ven: 34 mmHg — ABNORMAL LOW (ref 44–60)
pH, Ven: 7.058 — CL (ref 7.25–7.43)
pH, Ven: 7.309 (ref 7.25–7.43)
pO2, Ven: 187 mmHg — ABNORMAL HIGH (ref 32–45)
pO2, Ven: 59 mmHg — ABNORMAL HIGH (ref 32–45)

## 2021-11-28 LAB — CBC
HCT: 29.8 % — ABNORMAL LOW (ref 39.0–52.0)
Hemoglobin: 9.6 g/dL — ABNORMAL LOW (ref 13.0–17.0)
MCH: 35.7 pg — ABNORMAL HIGH (ref 26.0–34.0)
MCHC: 32.2 g/dL (ref 30.0–36.0)
MCV: 110.8 fL — ABNORMAL HIGH (ref 80.0–100.0)
Platelets: 123 10*3/uL — ABNORMAL LOW (ref 150–400)
RBC: 2.69 MIL/uL — ABNORMAL LOW (ref 4.22–5.81)
RDW: 17.1 % — ABNORMAL HIGH (ref 11.5–15.5)
WBC: 7.7 10*3/uL (ref 4.0–10.5)
nRBC: 0.8 % — ABNORMAL HIGH (ref 0.0–0.2)

## 2021-11-28 LAB — PHOSPHORUS: Phosphorus: 8.4 mg/dL — ABNORMAL HIGH (ref 2.5–4.6)

## 2021-11-28 LAB — HEPATIC FUNCTION PANEL
ALT: 10 U/L (ref 0–44)
AST: 12 U/L — ABNORMAL LOW (ref 15–41)
Albumin: 2.9 g/dL — ABNORMAL LOW (ref 3.5–5.0)
Alkaline Phosphatase: 127 U/L — ABNORMAL HIGH (ref 38–126)
Bilirubin, Direct: <0.1 mg/dL (ref 0.0–0.2)
Total Bilirubin: 1.4 mg/dL — ABNORMAL HIGH (ref 0.3–1.2)
Total Protein: 6.8 g/dL (ref 6.5–8.1)

## 2021-11-28 LAB — CBC WITH DIFFERENTIAL/PLATELET
Abs Immature Granulocytes: 0.07 10*3/uL (ref 0.00–0.07)
Basophils Absolute: 0 10*3/uL (ref 0.0–0.1)
Basophils Relative: 1 %
Eosinophils Absolute: 0.1 10*3/uL (ref 0.0–0.5)
Eosinophils Relative: 2 %
HCT: 25.1 % — ABNORMAL LOW (ref 39.0–52.0)
Hemoglobin: 8.5 g/dL — ABNORMAL LOW (ref 13.0–17.0)
Immature Granulocytes: 1 %
Lymphocytes Relative: 14 %
Lymphs Abs: 0.7 10*3/uL (ref 0.7–4.0)
MCH: 35.1 pg — ABNORMAL HIGH (ref 26.0–34.0)
MCHC: 33.9 g/dL (ref 30.0–36.0)
MCV: 103.7 fL — ABNORMAL HIGH (ref 80.0–100.0)
Monocytes Absolute: 0.4 10*3/uL (ref 0.1–1.0)
Monocytes Relative: 8 %
Neutro Abs: 3.6 10*3/uL (ref 1.7–7.7)
Neutrophils Relative %: 74 %
Platelets: 104 10*3/uL — ABNORMAL LOW (ref 150–400)
RBC: 2.42 MIL/uL — ABNORMAL LOW (ref 4.22–5.81)
RDW: 16.9 % — ABNORMAL HIGH (ref 11.5–15.5)
WBC: 4.9 10*3/uL (ref 4.0–10.5)
nRBC: 1 % — ABNORMAL HIGH (ref 0.0–0.2)

## 2021-11-28 LAB — CK: Total CK: 112 U/L (ref 49–397)

## 2021-11-28 LAB — MAGNESIUM
Magnesium: 3.1 mg/dL — ABNORMAL HIGH (ref 1.7–2.4)
Magnesium: 2.3 mg/dL (ref 1.7–2.4)

## 2021-11-28 LAB — BASIC METABOLIC PANEL
Anion gap: 20 — ABNORMAL HIGH (ref 5–15)
Anion gap: 19 — ABNORMAL HIGH (ref 5–15)
BUN: 73 mg/dL — ABNORMAL HIGH (ref 6–20)
BUN: 44 mg/dL — ABNORMAL HIGH (ref 6–20)
CO2: 8 mmol/L — ABNORMAL LOW (ref 22–32)
CO2: 17 mmol/L — ABNORMAL LOW (ref 22–32)
Calcium: 6.8 mg/dL — ABNORMAL LOW (ref 8.9–10.3)
Calcium: 7.2 mg/dL — ABNORMAL LOW (ref 8.9–10.3)
Chloride: 106 mmol/L (ref 98–111)
Chloride: 100 mmol/L (ref 98–111)
Creatinine, Ser: 14.34 mg/dL — ABNORMAL HIGH (ref 0.61–1.24)
Creatinine, Ser: 9.2 mg/dL — ABNORMAL HIGH (ref 0.61–1.24)
GFR, Estimated: 4 mL/min — ABNORMAL LOW (ref >60)
GFR, Estimated: 6 mL/min — ABNORMAL LOW (ref >60)
Glucose, Bld: 327 mg/dL — ABNORMAL HIGH (ref 70–99)
Glucose, Bld: 266 mg/dL — ABNORMAL HIGH (ref 70–99)
Potassium: 6.5 mmol/L (ref 3.5–5.1)
Potassium: 3.6 mmol/L (ref 3.5–5.1)
Sodium: 134 mmol/L — ABNORMAL LOW (ref 135–145)
Sodium: 136 mmol/L (ref 135–145)

## 2021-11-28 LAB — AMMONIA: Ammonia: 64 umol/L — ABNORMAL HIGH (ref 9–35)

## 2021-11-28 LAB — HEPATITIS B SURFACE ANTIGEN: Hepatitis B Surface Ag: NONREACTIVE

## 2021-11-28 LAB — CARBAMAZEPINE LEVEL, TOTAL: Carbamazepine Lvl: 6.5 ug/mL (ref 4.0–12.0)

## 2021-11-28 LAB — RESP PANEL BY RT-PCR (FLU A&B, COVID) ARPGX2
Influenza A by PCR: NEGATIVE
Influenza B by PCR: NEGATIVE
SARS Coronavirus 2 by RT PCR: NEGATIVE

## 2021-11-28 LAB — ALBUMIN: Albumin: 2.7 g/dL — ABNORMAL LOW (ref 3.5–5.0)

## 2021-11-28 LAB — CBG MONITORING, ED
Glucose-Capillary: 307 mg/dL — ABNORMAL HIGH (ref 70–99)
Glucose-Capillary: 242 mg/dL — ABNORMAL HIGH (ref 70–99)

## 2021-11-28 LAB — PHENOBARBITAL LEVEL: Phenobarbital: 12.7 ug/mL — ABNORMAL LOW (ref 15.0–40.0)

## 2021-11-28 MED ORDER — DOXERCALCIFEROL 4 MCG/2ML IV SOLN
3.0000 ug | INTRAVENOUS | Status: DC
  Administered 2021-11-28: 3 ug via INTRAVENOUS
  Filled 2021-11-28: qty 2

## 2021-11-28 MED ORDER — CHLORHEXIDINE GLUCONATE CLOTH 2 % EX PADS: 6.0000 | MEDICATED_PAD | Freq: Every day | CUTANEOUS | Status: DC

## 2021-11-28 MED ORDER — INSULIN ASPART 100 UNIT/ML IV SOLN
5.0000 [IU] | Freq: Once | INTRAVENOUS | Status: AC
  Administered 2021-11-28: 5 [IU] via INTRAVENOUS

## 2021-11-28 MED ORDER — ALBUTEROL SULFATE (2.5 MG/3ML) 0.083% IN NEBU
10.0000 mg | INHALATION_SOLUTION | Freq: Once | RESPIRATORY_TRACT | Status: AC
  Administered 2021-11-28: 10 mg via RESPIRATORY_TRACT
  Filled 2021-11-28: qty 12

## 2021-11-28 MED ORDER — SODIUM ZIRCONIUM CYCLOSILICATE 10 G PO PACK
10.0000 g | PACK | Freq: Once | ORAL | Status: AC
  Administered 2021-11-28: 10 g via ORAL
  Filled 2021-11-28: qty 1

## 2021-11-28 MED ORDER — ACETAMINOPHEN 500 MG PO TABS
1000.0000 mg | ORAL_TABLET | Freq: Once | ORAL | Status: AC
  Administered 2021-11-28: 1000 mg via ORAL
  Filled 2021-11-28: qty 2

## 2021-11-28 MED ORDER — OXYCODONE-ACETAMINOPHEN 5-325 MG PO TABS
1.0000 | ORAL_TABLET | Freq: Once | ORAL | Status: AC
  Administered 2021-11-28: 1 via ORAL
  Filled 2021-11-28: qty 1

## 2021-11-28 MED ORDER — FENTANYL CITRATE PF 50 MCG/ML IJ SOSY
50.0000 ug | PREFILLED_SYRINGE | Freq: Once | INTRAMUSCULAR | Status: AC
  Administered 2021-11-28: 50 ug via INTRAVENOUS
  Filled 2021-11-28: qty 1

## 2021-11-28 MED ORDER — HEPARIN SODIUM (PORCINE) 1000 UNIT/ML DIALYSIS: 1500.0000 [IU] | INTRAMUSCULAR | Status: DC | PRN

## 2021-11-28 NOTE — ED Provider Triage Note (Signed)
Emergency Medicine Provider Triage Evaluation Note  Eddie Davies , a 51 y.o. male  was evaluated in triage.  Pt complains of back pain and pain in feet. M/W/F dialysis, last dialyssi was Monday last week. Denies CP or SHOB. Has had a sinus cold, told to take Mucinex.  EMS reports CBG >300. Does not make urine. Review of Systems  Positive: As above Negative: As above  Physical Exam  BP (!) 102/59 (BP Location: Left Arm)   Pulse 82   Temp 97.6 F (36.4 C) (Oral)   Resp 20   SpO2 99%  Gen:   Awake, no distress   Resp:  Normal effort  MSK:   Moves extremities without difficulty  Other:    Medical Decision Making  Medically screening exam initiated at 6:39 AM.  Appropriate orders placed.  Eddie Davies was informed that the remainder of the evaluation will be completed by another provider, this initial triage assessment does not replace that evaluation, and the importance of remaining in the ED until their evaluation is complete.     Tacy Learn, [name] 11/28/21 [phone]

## 2021-11-28 NOTE — ED Triage Notes (Addendum)
Pt arrived by EMS from a SNF after missing HD for a week due to sinuses. Pt CBG > 300 per EMS and vitals stable. Pt states last HD was Monday. Pt states he hurts all over.

## 2021-11-28 NOTE — ED Notes (Signed)
IV attempted, unsuccessful. IV therapy consulted for stat start

## 2021-11-28 NOTE — Procedures (Signed)
HD Note:  Some information was entered later than the data was gathered due to patient care needs. The stated time with the data is accurate.  Received patient in bed to unit.  Alert and oriented.  Informed consent signed and in chart.   Treatment began with the patient on a 1K bath.  Acid was changed to 2K after 90 min  Patient became uncooperative at approximately 1640.  He had moved and was lying on the needles, forcing the pressures for the venous access too high.  Patient refused to move and was yelling that he was not going to reposition.  Patient finally rolled to his other side.  There was blood around the venous needle, but there was no active bleeding.  Patient decided to end treatment early.  He did not fell he could manage any more time.   Transported back to the room  Alert, without acute distress.  Hand-off given to patient's nurse.   Access used: Right upper arm fistula Access issues: None  Received 400 ml during dialysis treatment.  [facility]

## 2021-11-28 NOTE — ED Notes (Signed)
Patient transported to dialysis

## 2021-11-28 NOTE — ED Provider Notes (Incomplete)
I received this patient after they returned from dialysis.  Deviously seen by Prescott Gum, [name].  See her note for original history.  In short patient came today with myalgias and reports missing 2 dialysis sessions.  Labs showed a metabolic acidosis and hyperkalemia which was expected in the setting of missing dialysis.  Per hemodialysis note patient terminated treatment early because he could not tolerate any further treatment.  He said his body aches were too bad and nobody would give him any medication.   Physical Exam  BP (!) 118/57   Pulse 78   Temp (!) 97.1 F (36.2 C)   Resp (!) 22   Ht 6' 2"  (1.88 m)   Wt 102.6 kg   SpO2 100%   BMI 29.04 kg/m   Physical Exam Vitals and nursing note reviewed.  Constitutional:      Appearance: Normal appearance.  HENT:     Head: Normocephalic and atraumatic.  Eyes:     General: No scleral icterus.    Conjunctiva/sclera: Conjunctivae normal.  Pulmonary:     Effort: Pulmonary effort is normal. No respiratory distress.  Skin:    General: Skin is dry.     Findings: No rash.     Comments: Dry and scaled skin on bilateral lower extremities  Neurological:     Mental Status: He is alert.  Psychiatric:        Mood and Affect: Mood normal.     Procedures  Procedures  ED Course / MDM   Clinical Course as of 11/28/21 2141  [facility] Nov 28, 2021  0818 I discussed case with Nephrology Dr. Jonnie Finner who will evaluate patient for dialysis.  [GL]    Clinical Course User Index [GL] Sherre Poot Adora Fridge, [name]   Medical Decision Making Amount and/or Complexity of Data Reviewed Labs: ordered. Radiology: ordered.  Risk OTC drugs. Prescription drug management.    Labs reviewed: normal pH on repeat VBG Potassium and magnesium within normal limits  At this time patient is hemodynamically stable.  Has not had any more hypotensive readings.  Electrolytes are more

## 2021-11-28 NOTE — ED Notes (Signed)
Patient calling out for pain meds. PA notified.

## 2021-11-28 NOTE — Consult Note (Addendum)
Renal Service Consult Note [facility]  Eddie Davies 11/28/2021 Sol Blazing, MD Requesting Physician: Dr. Zenia Resides  Reason for Consult: ESRD pt missed HD here w/ [facility]p HPI: The patient is a 51 y.o. year-old w/ PMH as below who presented from SNF by EMS after missing HD x 2 due his "sinuses". CBG per EMS was > 300.  In ED K+ 6.5 and CXR vasc congestion. BP 102/59, HR 80 and RR 20. Afebrile. On RA 100% sat.  Pt rec'd IV insulin 5 units and 30m albuterol as hyperkalemia temporizing measures. Pt will be admitted. We are asked to see for dialysis.   Pt seen in ED. Pt is somnolent but fully oriented upon awakening. No SOB, no CP, sore legs. Having stomach issues , sick, maybe nausea/ vomiting.   ROS - denies CP, no joint pain, no HA, no blurry vision, no rash, no diarrhea, no nausea/ vomiting, no dysuria, no difficulty voiding   Past Medical History  Past Medical History:  Diagnosis Date   Abscess of scrotum 09/19/2014   Acute encephalopathy    Allergy, unspecified, initial encounter 04/06/2019   Altered mental status, unspecified 04/21/2018   Anemia 12/08/2016   Formatting of this note might be different from the original. Last Assessment & Plan:  Multifactorial chronic with DDx including iron deficiency, CKD, acute blood loss from self discontinuation of HD catheter.  Noted pancytopenia.  No gross blood loss in stool noticed, FOBT neg.  Transfused 1unit PRBC 2/20. - Continue oral iron - Consulted hematology-recs appreciated etiology likely hepatosplenome   Anemia due to chronic kidney disease 06/18/2018   Anemia due to chronic kidney disease, on chronic dialysis ([place] 04/07/2018   Anemia in chronic kidney disease 08/28/2018   Anemia in ESRD (end-stage renal disease) ([place] 06/18/2018   Anxiety disorder, unspecified    Axillary abscess 02/03/2014   Bacteremia due to Klebsiella pneumoniae 06/19/2018   Benign essential HTN 05/24/2018   Benign hypertension with  chronic kidney disease, stage III ([facility] 06/22/2016   Benign prostatic hyperplasia without lower urinary tract symptoms 05/24/2018   Bradycardia    Cellulitis of toe of right foot 09/19/2014   Chest pain, unspecified 01/27/2020   Cirrhosis ([place] 04/2018   Closed fracture of left upper extremity with routine healing 03/20/2018   Last Assessment & Plan:  Formatting of this note might be different from the original. Unclear etiology.   --Ortho consulted on admission s/p removal of cast and application of volar/dorsal splint  --Has had arranged follow-up with AEmelda BrothersNP will need to continue to follow-up for removal of cast   Closed intertrochanteric fracture of left femur ([place] 01/21/2018   Coagulopathy ([place] 02/06/2019   COVID-19 virus infection 01/09/2019   Cutaneous abscess of buttock    Delirium 03/14/2018   Last Assessment & Plan:  Formatting of this note might be different from the original. Multifactorial-combination of AKI, hypercarbia, non-cirrhotic hyperammonemia, hospital-acquired delirium-resolved.  Mental status now appears to be at baseline.  CT head- no acute findings: Generalized cerebral volume loss is advanced for patient's age, possible otomastoiditis correlate clinically(patient denies   Diabetes mellitus without complication ([facility]    type II   Diabetic ketoacidosis ([facility] 12/08/2016   Diabetic ulcer of toe associated with type 2 diabetes mellitus, limited to breakdown of skin ([facility] 02/14/2017   Diarrhea, unspecified 06/08/2018   Difficult intravenous access 03/16/2018   Last Assessment & Plan:  Formatting of this note might be different from the original. TLC 2/21 Consent obtained  from patient and his cousin Mosie Lukes   DKA (diabetic ketoacidosis) ([place]) 08/04/2020   Dyspnea, unspecified 06/01/2018   Encounter for removal of sutures 09/15/2020   Encounter for screening for respiratory tuberculosis 06/01/2018   End stage liver disease ([place]) 01/09/2019   Enterocolitis due  to Clostridium difficile, not specified as recurrent 08/09/2018   admission   Epilepsy, unspecified, not intractable, without status epilepticus ([place])    ESRD on hemodialysis ([place]) 12/27/2018   Failure to thrive in adult 01/21/2019   Fall 01/21/2018   Fever 06/18/2018   Fracture of left tibia 01/21/2018   Fracture of left ulna 01/21/2018   GERD (gastroesophageal reflux disease) 12/08/2016   Gout, unspecified    Gram negative sepsis ([place]) 12/27/2018   HCAP (healthcare-associated pneumonia) 04/21/2018   Heart failure, unspecified ([place])    History of Clostridioides difficile colitis 12/27/2018   HTN (hypertension) 03/08/2018   Formatting of this note might be different from the original. Last Assessment & Plan:  Patient on several meds as outpatient Cont hydralazine, low-dose amlodipine titrate as required Cont to monitor bp Last Assessment & Plan:  Formatting of this note might be different from the original. 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 pneumoniae) as the cause of diseases classified elsewhere 06/25/2018   admission   Leukocytosis 06/18/2018   Liver failure ([place])    Major depressive disorder, single episode, unspecified    Metabolic acidosis 75/1700   Metabolic bone disease 17/49/4496   Open wound of left foot except toes with complication 75/91/6384   Other cirrhosis of liver ([place]) 06/01/2018   Other fluid overload 04/10/2019   Other hypoglycemia 03/08/2018   type II Formatting of this note might be different from the original. Last Assessment & Plan:  Hemoglobin A1c 8 Carb controlled diet Sliding scale insulin, lantus 15 units nightly upon discharge up titrate as needed Medium carbohydrate diet at facility Fingerstick monitoring Last  Assessment & Plan:  Formatting of this note might be different from the original. Hemoglobin A1c 8 Carb controlled diet   Pain, unspecified 07/09/2018   Personal history of COVID-19 02/25/2019   Physical deconditioning 03/08/2018   Last Assessment & Plan:  Formatting of this note might be different from the original. -Patient is resident at [facility]. -PT/OT consulted will need continue PT services at facility  -ST consult Formatting of this note might be different from the original. Last Assessment & Plan:  -Patient is resident at [facility] -PT/OT consulted will need continue PT services at facility  -ST consult   Pneumonia    Polyneuropathy, unspecified    Postprocedural hematoma of a genitourinary system organ or structure following a genitourinary system procedure 02/14/2019   Pressure injury of lower back, stage 2 ([place]) 03/08/2018   Last Assessment & Plan:  Formatting of this note might be different from the original. No discharge, clean appearing wound Continue routine wound care Formatting of this note might be different from the original. Last Assessment & Plan:  No discharge, clean appearing wound Continue routine wound care   Pressure injury of skin 04/21/2018   Protein-calorie malnutrition, severe ([place]) 01/21/2019   Respiratory failure ([place])    Retroperitoneal abscess ([place]) 03/13/2019   Secondary hyperparathyroidism of renal origin ([place]) 08/28/2018   Seizures ([place]) 05/24/2018   Sepsis ([place])  05/24/2018   Septic shock ([place]) 01/09/2019   Thrombocytopenia ([place]) 05/24/2018   Tobacco use    Trauma 01/21/2018   Type 2 diabetes mellitus with unspecified complications ([place]) 99/77/4142   Urinary calculus, unspecified 03/08/2018   Last Assessment & Plan:  Formatting of this note might be different from the original. Resolved  creatinine 7.3. on admission Most likely ATN-urine output now appears to stabilized Baseline  2.5-3 in June of 2019 according to OSH records No recent baseline available Nephro  consulted patient started on HD via perm cath which he pulled 2/19 Cr improved Cont flomax renaly dose all meds Monitor cr and   Vitamin D deficiency    Past Surgical History  Past Surgical History:  Procedure Laterality Date   APPENDECTOMY  1981   AV FISTULA PLACEMENT Right 10/23/2018   Procedure: ARTERIOVENOUS (AV) FISTULA CREATION RIGHT ARM;  Surgeon: [name], MD;  Location: [place];  Service: Vascular;  Laterality: Right;   CHOLECYSTECTOMY     HERNIA REPAIR  2007   IR FLUORO GUIDE CV LINE RIGHT  05/30/2018   IR FLUORO GUIDE CV LINE RIGHT  03/18/2019   IR FLUORO GUIDE CV LINE RIGHT  03/22/2019   IR REMOVAL TUN CV CATH W/O FL  03/15/2019   IR SINUS/FIST TUBE CHK-NON GI  04/04/2019   IR US GUIDE VASC ACCESS RIGHT  05/30/2018   IR US GUIDE VASC ACCESS RIGHT  03/18/2019   IR US GUIDE VASC ACCESS RIGHT  03/22/2019   NEPHRECTOMY Left 2020   Family History  Family History  Problem Relation Age of Onset   Seizures Neg Hx    Social History  reports that he has been smoking cigarettes. He has been smoking an average of 1 pack per day. He has never used smokeless tobacco. He reports that he does not currently use alcohol. He reports that he does not use drugs. Allergies  Allergies  Allergen Reactions   Fish Oil Swelling        Ethyl Acetate Swelling   Lovaza [Omega-3-Acid Ethyl Esters] Swelling   Home medications Prior to Admission medications   Medication Sig Start Date End Date Taking? Authorizing Provider  acetaminophen (TYLENOL) 325 MG tablet Take 650 mg by mouth every 8 (eight) hours as needed for fever (pain).    [provider]  allopurinol (ZYLOPRIM) 100 MG tablet Take 100 mg by mouth daily.  08/01/18   [provider]  calcium carbonate (TUMS - DOSED IN MG ELEMENTAL CALCIUM) 500 MG chewable tablet Chew 1,000 mg by mouth 3 (three) times daily with meals.    [provider]  carbamazepine (TEGRETOL XR) 100 MG 12 hr tablet Take 100 mg by mouth 2 (two)  times daily. Take with 870m for a total dose of 9027m   [provider]  carbamazepine (TEGRETOL XR) 400 MG 12 hr tablet Take 800 mg by mouth 2 (two) times daily.    [provider]  collagenase (SANTYL) 250 UNIT/GM ointment Apply 1 Application topically See admin instructions. Apply to right heel every evening shift for diabetic wound care and as needed for soiled, displaced dressing    [provider]  cyclobenzaprine (FLEXERIL) 10 MG tablet Take 10 mg by mouth 2 (two) times daily as needed for muscle spasms.    [provider]  Dextrose, Diabetic Use, (INSTA-GLUCOSE) 77.4 % GEL Take 1 Dose by mouth as needed (BG < 70 (and pt is arousable, conscious and able to swallow)).  [provider]  DULoxetine (CYMBALTA) 20 MG capsule Take 20 mg by mouth daily.    [provider]  Glucagon, rDNA, (GLUCAGON EMERGENCY) 1 MG KIT Inject 1 mg as directed as needed (BG < 70 (not arousable, conscious or able to swallow)).    [provider]  insulin aspart (NOVOLOG) 100 UNIT/ML injection Inject 3-15 Units into the skin See admin instructions. 3 times daily per sliding scale 140-180 = 3 units 181-241 = 4 units  242-300 = 6 units 301-350 = 8 units 351-400 = 12 units 401-450 = 15 units > 450 - notify provider    [provider]  Insulin Glargine (BASAGLAR KWIKPEN) 100 UNIT/ML Inject 10 Units into the skin daily. 10/12/21 11/11/21  Erick Colace, NP  Ipratropium-Albuterol (COMBIVENT) 20-100 MCG/ACT AERS respimat Inhale 1 puff into the lungs every 6 (six) hours as needed for wheezing.    [provider]  ketoconazole (NIZORAL) 2 % cream Apply 1 Application topically See admin instructions. Apply to scalp topically every day shift on Tuesday, Friday, Sunday    [provider]  lactulose (CHRONULAC) 10 GM/15ML solution Take 10 g by mouth See admin instructions. 10 g twice daily for hepatic failure. Hold on dialysis days prior  to dialysis.    [provider]  levETIRAcetam (KEPPRA XR) 500 MG 24 hr tablet Take 3 tablets (1,500 mg total) by mouth at bedtime. 10/19/21   Melvenia Beam, MD  levETIRAcetam (KEPPRA) 250 MG tablet Take 250 mg by mouth See admin instructions. 250 mg every M-W-F, give after arrival back from dialysis.    [provider]  lidocaine-prilocaine (EMLA) cream Apply 1 Application topically every Monday, Wednesday, and Friday with hemodialysis.    [provider]  Multiple Vitamin (MULTIVITAMIN WITH MINERALS) TABS tablet Take 1 tablet by mouth daily.    [provider]  omeprazole (PRILOSEC) 20 MG capsule Take 20 mg by mouth daily.  07/04/18   [provider]  ondansetron (ZOFRAN) 4 MG tablet Take 4 mg by mouth every 8 (eight) hours as needed for nausea.    [provider]  PHENobarbital (LUMINAL) 64.8 MG tablet Take 1 tablet (64.8 mg total) by mouth at bedtime. Take with two of the 97.2 mg tablets for total dose of 259.2 mg. 04/29/21 12/04/21  Melvenia Beam, MD  PHENobarbital (LUMINAL) 97.2 MG tablet TAKE 2 TABLETS (194.4 MG TOTAL) BY MOUTH AT BEDTIME. TAKE WITH 64.8 MG TABLET FOR TOTAL DOSE OF 259.2 MG AT BEDTIME. 11/08/21   Camara, Amadou, MD  PROTEIN PO Take 30 mLs by mouth 2 (two) times daily. Protein liquid    [provider]  simvastatin (ZOCOR) 20 MG tablet Take 20 mg by mouth at bedtime.     [provider]  Skin Protectants, Misc. (EUCERIN) cream Apply 1 Application topically 2 (two) times daily. Apply to BUE and BLE topically every day and evening shift for dry skin if resident will allow.    [provider]  tamsulosin (FLOMAX) 0.4 MG CAPS capsule Take 0.4 mg by mouth at bedtime.    [provider]  TRADJENTA 5 MG TABS tablet Take 5 mg by mouth daily. 08/19/19   [provider]     Vitals:   11/28/21 6010 11/28/21 0739 11/28/21 0829  BP: (!) 102/59 102/61   Pulse: 82 73   Resp: 20 18   Temp:  97.6 F (36.4 C) (!) 97.1 F (36.2 C)   TempSrc: Oral  SpO2: 99% 100%   Weight:   102.6 kg  Height:   _0  (1.88 m)   Exam Gen alert, no distress, somnolent but arouses easily and Ox3 No diffuse scaling of skin x 4 ext, poor skin turgor Sclera anicteric, throat clear and dry Flat neck veins Chest clear bilat to bases, no rales/ wheezing RRR no MRG Abd soft ntnd no mass or ascites +bs GU normal male MS no joint effusions or deformity Ext no LE or UE edema, no wounds or ulcers Neuro is alert, Ox 3 , nf    RUA aVF+bruit    Home meds include - allopurinol, carbamazepine, duloxetine, insulin aspart/ glargine, combivent, lactulose, keppra, priolosec, phenobarbital, zocor, flomax, tradjenta, vits/ supps/ prns   OP HD: [facility] MWF  4h 2mn  101.3kg  400/500  2K/3Ca bath  R AVF  Hep 1500 - mircera 200 q2, last 10/30 - last HD 10/30, came off 104.5kg - venofer 50 /wk - hectorol 3 ug iv tiw  Assessment/ Plan: Hyperkalemia - QRS wide 170 msec. SP temporizing measures in ED. Plan HD this afternoon.  Hypotension - BP's are usually normal to slightly low at OP HD. Pt looks vol depleted w/ poor skin turgor, dry mouth. Will bolus 500 cc NS. Keep even w/ HD today.  Vol overload - on RA, CXR no edema.  ESRD - missed last 2 OP HD. Plan for HD today d/t hyperkalemia.  IDDM - per pmd Seizure d/o - per pmd Cirrhosis - takes lactulose at home Anemia esrd - Hb 9, next esa due 11/13, follow Hb. MBD ckd - Ca low, get alb/ phos. Cont IV vdra.   RKelly Splinter MD 11/28/2021, 8:43 AM Recent Labs  Lab 11/28/21 0642 11/28/21 0828  HGB 9.6* 9.5*  CALCIUM 6.8*  --   CREATININE 14.34*  --   K 6.5* 6.5*   Inpatient medications:

## 2021-11-28 NOTE — ED Provider Notes (Signed)
I received this patient after they returned from dialysis.  Deviously seen by Prescott Gum, [name].  See her note for original history.  In short patient came today with myalgias and reports missing 2 dialysis sessions.  Labs showed a metabolic acidosis and hyperkalemia which was expected in the setting of missing dialysis.  Per hemodialysis note patient terminated treatment early because he could not tolerate any further treatment.  He said his body aches were too bad and nobody would give him any medication.   Physical Exam  BP (!) 118/57   Pulse 78   Temp (!) 97.1 F (36.2 C)   Resp (!) 22   Ht 6' 2"  (1.88 m)   Wt 102.6 kg   SpO2 100%   BMI 29.04 kg/m   Physical Exam Vitals and nursing note reviewed.  Constitutional:      Appearance: Normal appearance.  HENT:     Head: Normocephalic and atraumatic.  Eyes:     General: No scleral icterus.    Conjunctiva/sclera: Conjunctivae normal.  Pulmonary:     Effort: Pulmonary effort is normal. No respiratory distress.  Skin:    General: Skin is dry.     Findings: No rash.     Comments: Dry and scaled skin on bilateral lower extremities  Neurological:     Mental Status: He is alert.  Psychiatric:        Mood and Affect: Mood normal.     Procedures  Procedures  ED Course / MDM   Clinical Course as of 11/28/21 2141  [facility] Nov 28, 2021  0818 I discussed case with Nephrology Dr. Jonnie Finner who will evaluate patient for dialysis.  [GL]    Clinical Course User Index [GL] Sherre Poot Adora Fridge, [name]   Medical Decision Making Amount and/or Complexity of Data Reviewed Labs: ordered. Radiology: ordered.  Risk OTC drugs. Prescription drug management.    Labs reviewed: normal pH on repeat VBG Potassium and magnesium within normal limits  At this time patient is hemodynamically stable.  Has not had any more hypotensive readings.  Electrolytes are improved.  He is stable for discharge with PCP follow-up tomorrow and he voices that he  also will not miss any further dialysis.  He is also encouraged to continue to take his antiepileptics as prescribed.  Spoke about this patient with my attending who agrees with the plan.  Return precautions discussed and attached to his discharge papers.      Rhae Hammock, [name] 11/29/21 0005    Sherwood Gambler, MD 12/02/21 2055

## 2021-11-28 NOTE — ED Provider Notes (Signed)
I saw and evaluated the patient, reviewed the resident's note and I agree with the findings and plan.  EKG Interpretation  Date/Time:  Sunday November 28 2021 07:43:26 EST Ventricular Rate:  75 PR Interval:  210 QRS Duration: 171 QT Interval:  471 QTC Calculation: 527 R Axis:   263 Text Interpretation: Sinus rhythm Atrial premature complex Borderline prolonged PR interval Nonspecific IVCD with LAD Confirmed by Lacretia Leigh ([id number]) on 11/28/2021 7:8:39 AM   51 year old male presents with URI symptoms times several days.  Has history of ESRD and has not been dialyzed for a week.  Complains of diffuse myalgias.  On exam, he is sleepy but arousable.  Patient is on multiple medications including few department carbamazepine.  We will get levels of that.  Patient also profoundly hyperkalemic here.  Will be treated for that per protocol.  Discussed case with nephrology will come and see patient.  Patient will likely require admission   Lacretia Leigh, MD 11/28/21 [phone]

## 2021-11-28 NOTE — ED Notes (Signed)
Dr.Allen shown results of Istat VBG. ED-Lab.

## 2021-11-28 NOTE — ED Provider Notes (Signed)
[facility]ERG[facility] DEPARTMENT Provider Note   CSN: [id number] Arrival date & time: 11/28/21  4166     History PMH: Cirrhosis, CHF, HTN, DM2, ESRD on HD, Epilepsy,  Chief Complaint  Patient presents with   Hyperglycemia    Eddie Davies is a 51 y.o. male.  Patient presenting to the ED with full body myalgias, complaints of a recent URI causing him to miss his last two dialysis sessions. Last dialyzed on 11/5. He said URI symptoms were productive cough, fatigue, and nasal congestion. He denies shortness of breath, chest pain, nausea, vomiting, abdominal pain, fevers, chills.    Hyperglycemia Associated symptoms: fatigue        Home Medications Prior to Admission medications   Medication Sig Start Date End Date Taking? Authorizing Provider  acetaminophen (TYLENOL) 325 MG tablet Take 650 mg by mouth every 8 (eight) hours as needed for fever (pain).    [provider]  allopurinol (ZYLOPRIM) 100 MG tablet Take 100 mg by mouth daily.  08/01/18   [provider]  calcium carbonate (TUMS - DOSED IN MG ELEMENTAL CALCIUM) 500 MG chewable tablet Chew 1,000 mg by mouth 3 (three) times daily with meals.    [provider]  carbamazepine (TEGRETOL XR) 100 MG 12 hr tablet Take 100 mg by mouth 2 (two) times daily. Take with 858m for a total dose of 9012m   [provider]  carbamazepine (TEGRETOL XR) 400 MG 12 hr tablet Take 800 mg by mouth 2 (two) times daily.    [provider]  collagenase (SANTYL) 250 UNIT/GM ointment Apply 1 Application topically See admin instructions. Apply to right heel every evening shift for diabetic wound care and as needed for soiled, displaced dressing    [provider]  cyclobenzaprine (FLEXERIL) 10 MG tablet Take 10 mg by mouth 2 (two) times daily as needed for muscle spasms.    [provider]  Dextrose, Diabetic Use, (INSTA-GLUCOSE) 77.4 % GEL Take 1 Dose by mouth as  needed (BG < 70 (and pt is arousable, conscious and able to swallow)).    [provider]  DULoxetine (CYMBALTA) 20 MG capsule Take 20 mg by mouth daily.    [provider]  Glucagon, rDNA, (GLUCAGON EMERGENCY) 1 MG KIT Inject 1 mg as directed as needed (BG < 70 (not arousable, conscious or able to swallow)).    [provider]  insulin aspart (NOVOLOG) 100 UNIT/ML injection Inject 3-15 Units into the skin See admin instructions. 3 times daily per sliding scale 140-180 = 3 units 181-241 = 4 units  242-300 = 6 units 301-350 = 8 units 351-400 = 12 units 401-450 = 15 units > 450 - notify provider    [provider]  Insulin Glargine (BASAGLAR KWIKPEN) 100 UNIT/ML Inject 10 Units into the skin daily. 10/12/21 11/11/21  BaErick ColaceNP  Ipratropium-Albuterol (COMBIVENT) 20-100 MCG/ACT AERS respimat Inhale 1 puff into the lungs every 6 (six) hours as needed for wheezing.    [provider]  ketoconazole (NIZORAL) 2 % cream Apply 1 Application topically See admin instructions. Apply to scalp topically every day shift on Tuesday, Friday, Sunday    [provider]  lactulose (CHRONULAC) 10 GM/15ML solution Take 10 g by mouth See admin instructions. 10 g twice daily for hepatic failure. Hold on dialysis days prior to dialysis.    [provider]  levETIRAcetam (KEPPRA XR) 500 MG 24 hr tablet Take 3 tablets (1,500 mg  total) by mouth at bedtime. 10/19/21   Melvenia Beam, MD  levETIRAcetam (KEPPRA) 250 MG tablet Take 250 mg by mouth See admin instructions. 250 mg every M-W-F, give after arrival back from dialysis.    [provider]  lidocaine-prilocaine (EMLA) cream Apply 1 Application topically every Monday, Wednesday, and Friday with hemodialysis.    [provider]  Multiple Vitamin (MULTIVITAMIN WITH MINERALS) TABS tablet Take 1 tablet by mouth daily.    [provider]  omeprazole (PRILOSEC) 20 MG capsule Take  20 mg by mouth daily.  07/04/18   [provider]  ondansetron (ZOFRAN) 4 MG tablet Take 4 mg by mouth every 8 (eight) hours as needed for nausea.    [provider]  PHENobarbital (LUMINAL) 64.8 MG tablet Take 1 tablet (64.8 mg total) by mouth at bedtime. Take with two of the 97.2 mg tablets for total dose of 259.2 mg. 04/29/21 12/04/21  Melvenia Beam, MD  PHENobarbital (LUMINAL) 97.2 MG tablet TAKE 2 TABLETS (194.4 MG TOTAL) BY MOUTH AT BEDTIME. TAKE WITH 64.8 MG TABLET FOR TOTAL DOSE OF 259.2 MG AT BEDTIME. 11/08/21   Camara, Amadou, MD  PROTEIN PO Take 30 mLs by mouth 2 (two) times daily. Protein liquid    [provider]  simvastatin (ZOCOR) 20 MG tablet Take 20 mg by mouth at bedtime.     [provider]  Skin Protectants, Misc. (EUCERIN) cream Apply 1 Application topically 2 (two) times daily. Apply to BUE and BLE topically every day and evening shift for dry skin if resident will allow.    [provider]  tamsulosin (FLOMAX) 0.4 MG CAPS capsule Take 0.4 mg by mouth at bedtime.    [provider]  TRADJENTA 5 MG TABS tablet Take 5 mg by mouth daily. 08/19/19   [provider]      Allergies    Fish oil, Ethyl acetate, and Lovaza [omega-3-acid ethyl esters]    Review of Systems   Review of Systems  Constitutional:  Positive for fatigue.  HENT:  Positive for congestion.   Respiratory:  Positive for cough.   Musculoskeletal:  Positive for myalgias.  All other systems reviewed and are negative.   Physical Exam Updated Vital Signs BP 100/62   Pulse 66   Temp (!) 97.1 F (36.2 C)   Resp 12   Ht _0  (1.88 m)   Wt 102.6 kg   SpO2 99%   BMI 29.04 kg/m  Physical Exam Vitals and nursing note reviewed.  Constitutional:      General: He is not in acute distress.    Appearance: Normal appearance. He is well-developed. He is not ill-appearing, toxic-appearing or diaphoretic.  HENT:     Head: Normocephalic and  atraumatic.     Nose: No nasal deformity or congestion.     Mouth/Throat:     Lips: Pink. No lesions.     Mouth: Mucous membranes are dry.     Pharynx: Oropharynx is clear. No oropharyngeal exudate or posterior oropharyngeal erythema.  Eyes:     General: Gaze aligned appropriately. No scleral icterus.       Right eye: No discharge.        Left eye: No discharge.     Conjunctiva/sclera: Conjunctivae normal.     Right eye: Right conjunctiva is not injected. No exudate or hemorrhage.    Left eye: Left conjunctiva is not injected. No exudate or hemorrhage. Cardiovascular:     Rate and Rhythm: Normal  rate and regular rhythm.     Pulses: Normal pulses.     Heart sounds: Normal heart sounds. No murmur heard.    No friction rub. No gallop.  Pulmonary:     Effort: Pulmonary effort is normal. No respiratory distress.     Breath sounds: Normal breath sounds. No stridor. No wheezing, rhonchi or rales.  Chest:     Chest wall: No tenderness.  Abdominal:     General: Abdomen is flat. There is no distension.     Palpations: Abdomen is soft.     Tenderness: There is no abdominal tenderness. There is no right CVA tenderness, left CVA tenderness, guarding or rebound.  Musculoskeletal:     Right lower leg: Edema present.     Left lower leg: Edema present.     Comments: 2+ pitting edema  Skin:    General: Skin is warm and dry.     Coloration: Skin is not jaundiced.  Neurological:     Mental Status: He is alert and oriented to person, place, and time.  Psychiatric:        Mood and Affect: Mood normal.        Speech: Speech normal.        Behavior: Behavior normal. Behavior is cooperative.     ED Results / Procedures / Treatments   Labs (all labs ordered are listed, but only abnormal results are displayed) Labs Reviewed  BASIC METABOLIC PANEL - Abnormal; Notable for the following components:      Result Value   Sodium 134 (*)    Potassium 6.5 (*)    CO2 8 (*)    Glucose, Bld 327 (*)     BUN 73 (*)    Creatinine, Ser 14.34 (*)    Calcium 6.8 (*)    GFR, Estimated 4 (*)    Anion gap 20 (*)    All other components within normal limits  CBC - Abnormal; Notable for the following components:   RBC 2.69 (*)    Hemoglobin 9.6 (*)    HCT 29.8 (*)    MCV 110.8 (*)    MCH 35.7 (*)    RDW 17.1 (*)    Platelets 123 (*)    nRBC 0.8 (*)    All other components within normal limits  HEPATIC FUNCTION PANEL - Abnormal; Notable for the following components:   Albumin 2.9 (*)    AST 12 (*)    Alkaline Phosphatase 127 (*)    Total Bilirubin 1.4 (*)    All other components within normal limits  MAGNESIUM - Abnormal; Notable for the following components:   Magnesium 3.1 (*)    All other components within normal limits  AMMONIA - Abnormal; Notable for the following components:   Ammonia 64 (*)    All other components within normal limits  PHENOBARBITAL LEVEL - Abnormal; Notable for the following components:   Phenobarbital 12.7 (*)    All other components within normal limits  CBG MONITORING, ED - Abnormal; Notable for the following components:   Glucose-Capillary 307 (*)    All other components within normal limits  I-STAT VENOUS BLOOD GAS, ED - Abnormal; Notable for the following components:   pH, Ven 7.058 (*)    pCO2, Ven 28.8 (*)    pO2, Ven 187 (*)    Bicarbonate 8.1 (*)    TCO2 9 (*)    Acid-base deficit 21.0 (*)    Sodium 134 (*)    Potassium 6.5 (*)  Calcium, Ion 0.90 (*)    HCT 28.0 (*)    Hemoglobin 9.5 (*)    All other components within normal limits  CBG MONITORING, ED - Abnormal; Notable for the following components:   Glucose-Capillary 242 (*)    All other components within normal limits  RESP PANEL BY RT-PCR (FLU A&B, COVID) ARPGX2  CK  CARBAMAZEPINE LEVEL, TOTAL  BLOOD GAS, VENOUS  ALBUMIN  PHOSPHORUS  HEPATITIS B SURFACE ANTIGEN  HEPATITIS B SURFACE ANTIBODY, QUANTITATIVE    EKG EKG Interpretation  Date/Time:  Sunday November 28 2021  07:43:26 EST Ventricular Rate:  75 PR Interval:  210 QRS Duration: 171 QT Interval:  471 QTC Calculation: 527 R Axis:   263 Text Interpretation: Sinus rhythm Atrial premature complex Borderline prolonged PR interval Nonspecific IVCD with LAD Confirmed by Lacretia Leigh ([id number]) on 11/28/2021 7:57:42 AM  Radiology DG Chest Portable 1 View  Result Date: 11/28/2021 CLINICAL DATA:  Cough.  End-stage renal disease on dialysis. EXAM: PORTABLE CHEST 1 VIEW COMPARISON:  10/12/2021 FINDINGS: Stable cardiomegaly.  Both lungs are clear. IMPRESSION: Stable cardiomegaly. No active lung disease. Electronically Signed   By: Marlaine Hind M.D.   On: 11/28/2021 08:44    Procedures .Critical Care  Performed by: Adolphus Birchwood, [name] Authorized by: Adolphus Birchwood, [name]   Critical care provider statement:    Critical care time (minutes):  70   Critical care time was exclusive of:  Separately billable procedures and treating other patients   Critical care was necessary to treat or prevent imminent or life-threatening deterioration of the following conditions:  Renal failure   Critical care was time spent personally by me on the following activities:  Blood draw for specimens, development of treatment plan with patient or surrogate, discussions with consultants, discussions with primary provider, evaluation of patient's response to treatment, examination of patient, obtaining history from patient or surrogate, review of old charts, re-evaluation of patient's condition, pulse oximetry, ordering and review of radiographic studies, ordering and review of laboratory studies and ordering and performing treatments and interventions   This patient was on telemetry or cardiac monitoring during their time in the ED.    Medications Ordered in ED Medications  doxercalciferol (HECTOROL) injection 3 mcg (has no administration in time range)  Chlorhexidine Gluconate Cloth 2 % PADS 6 each (has no administration in time  range)  albuterol (PROVENTIL) (2.5 MG/3ML) 0.083% nebulizer solution 10 mg (10 mg Nebulization Given 11/28/21 0838)  insulin aspart (novoLOG) injection 5 Units (5 Units Intravenous Given 11/28/21 0831)  sodium zirconium cyclosilicate (LOKELMA) packet 10 g (10 g Oral Given 11/28/21 0833)  fentaNYL (SUBLIMAZE) injection 50 mcg (50 mcg Intravenous Given 11/28/21 0954)  acetaminophen (TYLENOL) tablet 1,000 mg (1,000 mg Oral Given 11/28/21 1305)    ED Course/ Medical Decision Making/ A&P Clinical Course as of 11/28/21 1456  Sun Nov 28, 2021  0818 I discussed case with Nephrology Dr. Jonnie Finner who will evaluate patient for dialysis.  [GL]    Clinical Course User Index [GL] Sherre Poot Adora Fridge, [name]                           Medical Decision Making Amount and/or Complexity of Data Reviewed Labs: ordered. Radiology: ordered.  Risk OTC drugs. Prescription drug management.    MDM  This is a 51 y.o. male who presents to the ED with myalgias, URI sx, and missing two dialysis sessions.   Initial Impression  Patient is  acute on chronically ill appearing. Afebrile, BP on lower side but normal, HDS, O2 100%  I personally ordered, reviewed, and interpreted all laboratory work and imaging and agree with radiologist interpretation. Results interpreted below: Labs notable for hyperkalemia to 6.5 without EKG changes. Hyperglycemia and AG metabolic acidosis.  VBG confirms metabolic acidosis with pH of 7.058, pCO2 28.8, Bicar 8.1, K 6.5, Ca 6.8 Ammonia is minimally elevated. Will give a dose of lactulose here. Serum drug levels are not elevated.  CK is in normal range. Magnesium is elevated to 3.1. He tested negative for COVID/Flu.   Assessment/Plan:  Labs indicate a metabolic acidosis and hyperkalemia. This is all likely 2/2 to missing HD. His potassium has been tempered with albuterol, insulin, and lokelma. I have discussed case with Nephrology who will plan to dialize today.  I reassessed patient prior  to dialysis and he continues to complain of myalgias that are likely caused by potassium elevation. He is not septic and not encephalopathic. Glucose has went down to under 300 and acidosis is thought to be related to missing dialysis than DKA. His multiple metabolic derangements will likely improve following dialysis and he has no other admission needs at this time. Plan will be for patient to be reassessed to determine dispo following dialysis this afternoon.       Charting Requirements Additional history is obtained from:  Independent historian External Records from outside source obtained and reviewed including: Prior admission for hyperkalemia Social Determinants of Health:  none Pertinant PMH that complicates patient's illness: ESRD, DM, Cirrhosis, Epilepsy  Patient Care Problems that were addressed during this visit: - Metabolic acidosis: Acute illness with systemic symptoms - Hyperkalemia: Acute illness with systemic symptoms - ESRD needing dialysis: Acute illness with systemic symptoms This patient was maintained on a cardiac monitor/telemetry. I personally viewed and interpreted the cardiac monitor which reveals an underlying rhythm of NSR Medications given in ED: insulin, lokelma, albuterol, fentanyl, tylenol Reevaluation of the patient after these medicines showed that the patient improved I have reviewed home medications and made changes accordingly.  Critical Care Interventions: hyperkalemia correction, emergent dialysis needs Consultations: nephrology Disposition: dialysis then reassess for dispo  This is a shared visit with my attending physician, Dr. Zenia Resides.  We have discussed this patient and they have independently evaluated this patient. The plan was altered or changed as needed.  Portions of this note were generated with [employer]. Dictation errors may occur despite best attempts at proofreading.    Final Clinical Impression(s) / ED Diagnoses Final  diagnoses:  Metabolic acidosis  Hyperkalemia  ESRD needing dialysis [facility])    Rx / DC Orders ED Discharge Orders     None         Adolphus Birchwood, [name] 11/28/21 1456    Lacretia Leigh, MD 11/29/21 [phone]

## 2021-11-28 NOTE — ED Notes (Signed)
Patient continually calling out for pain meds. PA notified again.

## 2021-11-28 NOTE — Discharge Instructions (Addendum)
You came in today with elevated blood sugar and you had missed dialysis.  Your repeat lab work looks good.  Please do not miss any more dialysis, return with any worsening symptoms. It was a pleasure to meet you and we hope you feel better!

## 2021-11-29 NOTE — ED Notes (Signed)
Report given to Mickel Baas, Therapist, sports at [facility].

## 2021-11-29 NOTE — ED Notes (Signed)
Called [facility] to take patient back to [facility]

## 2021-11-29 NOTE — Procedures (Signed)
   I was present at this dialysis session, have reviewed the session itself and made  appropriate changes Kelly Splinter MD [facility] pager [phone]   11/29/2021, 12:19 AM

## 2021-11-30 LAB — HEPATITIS B SURFACE ANTIBODY, QUANTITATIVE: Hep B S AB Quant (Post): 16.8 m[IU]/mL (ref >9.9)

## 2022-01-18 ENCOUNTER — Ambulatory Visit: Payer: Medicare Other | Admitting: Neurology

## 2022-03-08 ENCOUNTER — Encounter: Payer: Self-pay | Admitting: Neurology

## 2022-03-08 ENCOUNTER — Ambulatory Visit (INDEPENDENT_AMBULATORY_CARE_PROVIDER_SITE_OTHER): Payer: Medicare Other | Admitting: Neurology

## 2022-03-08 VITALS — BP 145/86 | HR 75 | Ht 74.0 in

## 2022-03-08 DIAGNOSIS — G40919 Epilepsy, unspecified, intractable, without status epilepticus: Secondary | ICD-10-CM | POA: Diagnosis not present

## 2022-03-08 DIAGNOSIS — G40019 Localization-related (focal) (partial) idiopathic epilepsy and epileptic syndromes with seizures of localized onset, intractable, without status epilepticus: Secondary | ICD-10-CM

## 2022-03-08 NOTE — Progress Notes (Signed)
[facility]  PATIENT: Eddie Davies DOB: February 19, 1970  REQUESTING CLINICIAN: Anson [place], PA* HISTORY FROM: Patient and chart review  REASON FOR VISIT: TOC for his epilepsy    HISTORICAL  CHIEF COMPLAINT:  Chief Complaint  Patient presents with   Follow-up    Rm 14. Alone. TOC from Dr. Lucia Gaskins.    HISTORY OF PRESENT ILLNESS:  This is a 52 year old gentleman with multiple medical conditions including epilepsy, end-stage renal disease on dialysis, hypertension, hyperlipidemia, diabetes mellitus, who is presenting for further management of his seizures.  He was previously managed by Dr. Lucia Gaskins.  He reports the seizures started at the age of 89, since then he continued to have seizures, the seizures are still intractable.  Previously he was on phenobarbital and carbamazepine but was still having seizures up to 10 seizures per week.  He described the seizures as episodes where everything goes slow and he feels very hot.  He said during this time he is aware, sometimes is unable to answer and sometimes is able to answer.  He denies any convulsions.  He reports since seeing Dr. Lucia Gaskins and Keppra was added and since the addition of Keppra the seizure frequency decreased. Now he is having less than 10 a month when again he used to have about 10 seizures per week.  He is on high-dose of phenobarbital, carbamazepine and levetiracetam, diagnosed with osteoporosis and is looking for ways to reduce his antiseizure medication while controlling seizures.   Handedness: Right handed   Onset: Since the age of 70   Seizure Type: Everything is very slow, feeling hot  Current frequency: Now having less than 10 a months, prior use to have about 10 seizure a week   Any injuries from seizures: Denies   Seizure risk factors:   Previous ASMs: Phenobarbital, Levetiracetam, Carbamazepine  Currenty ASMs: Phenobarbital 259.2 mg at bedtime, Levetiracetam 1500 mg at bedtime with  an additional 250 mg after dialysis, Carbamazepine 900 mg twice daily   ASMs side effects: Daytime fatigue   Brain Images: None available for review   Previous EEGs: Last amb: Diffuse slowing and right frontal slowing    OTHER MEDICAL CONDITIONS: ESRD on Dialysis, Hypertension, hyperlipidemia, Diabetes, Osteoporosis  REVIEW OF SYSTEMS: Full 14 system review of systems performed and negative with exception of: As noted in the HPI   ALLERGIES: Allergies  Allergen Reactions   Fish Oil Swelling        Ethyl Acetate Swelling   Lovaza [Omega-3-Acid Ethyl Esters] Swelling    HOME MEDICATIONS: Outpatient Medications Prior to Visit  Medication Sig Dispense Refill   acetaminophen (TYLENOL) 325 MG tablet Take 650 mg by mouth every 8 (eight) hours as needed for fever (pain).     allopurinol (ZYLOPRIM) 100 MG tablet Take 100 mg by mouth daily.      calcium carbonate (TUMS - DOSED IN MG ELEMENTAL CALCIUM) 500 MG chewable tablet Chew 1,000 mg by mouth 3 (three) times daily with meals.     carbamazepine (TEGRETOL XR) 100 MG 12 hr tablet Take 100 mg by mouth 2 (two) times daily. Take with 800mg  for a total dose of 900mg      carbamazepine (TEGRETOL XR) 400 MG 12 hr tablet Take 800 mg by mouth 2 (two) times daily.     cyclobenzaprine (FLEXERIL) 10 MG tablet Take 10 mg by mouth 2 (two) times daily as needed for muscle spasms.     DULoxetine (CYMBALTA) 20 MG capsule Take 20 mg by mouth daily.  ferrous sulfate 325 (65 FE) MG EC tablet Take 325 mg by mouth 3 (three) times daily with meals.     HYDROcodone-acetaminophen (NORCO/VICODIN) 5-325 MG tablet Take 1 tablet by mouth every 4 (four) hours as needed.     insulin glargine-yfgn (SEMGLEE) 100 UNIT/ML Pen Inject 10 Units into the skin 2 (two) times daily.     lactulose (CHRONULAC) 10 GM/15ML solution Take 10 g by mouth See admin instructions. 10 g twice daily for hepatic failure. Hold on dialysis days prior to dialysis.     levETIRAcetam (KEPPRA XR)  500 MG 24 hr tablet Take 3 tablets (1,500 mg total) by mouth at bedtime. 90 tablet 11   levETIRAcetam (KEPPRA) 250 MG tablet Take 250 mg by mouth See admin instructions. 250 mg every M-W-F, give after arrival back from dialysis.     lidocaine-prilocaine (EMLA) cream Apply 1 Application topically every Monday, Wednesday, and Friday with hemodialysis.     loperamide (IMODIUM A-D) 2 MG tablet Take 2 mg by mouth 4 (four) times daily as needed for diarrhea or loose stools.     Multiple Vitamin (MULTIVITAMIN WITH MINERALS) TABS tablet Take 1 tablet by mouth daily.     omeprazole (PRILOSEC) 20 MG capsule Take 20 mg by mouth daily.      ondansetron (ZOFRAN) 4 MG tablet Take 4 mg by mouth every 8 (eight) hours as needed for nausea.     PHENobarbital (LUMINAL) 64.8 MG tablet Take 1 tablet (64.8 mg total) by mouth at bedtime. Take with two of the 97.2 mg tablets for total dose of 259.2 mg. 30 tablet 0   PHENobarbital (LUMINAL) 97.2 MG tablet TAKE 2 TABLETS (194.4 MG TOTAL) BY MOUTH AT BEDTIME. TAKE WITH 64.8 MG TABLET FOR TOTAL DOSE OF 259.2 MG AT BEDTIME. 60 tablet 2   saxagliptin HCl (ONGLYZA) 2.5 MG TABS tablet Take 2.5 mg by mouth daily.     Skin Protectants, Misc. (EUCERIN) cream Apply 1 Application topically 2 (two) times daily. Apply to BUE and BLE topically every day and evening shift for dry skin if resident will allow.     tamsulosin (FLOMAX) 0.4 MG CAPS capsule Take 0.4 mg by mouth at bedtime.     Dextrose, Diabetic Use, (INSTA-GLUCOSE) 77.4 % GEL Take 1 Dose by mouth as needed (BG < 70 (and pt is arousable, conscious and able to swallow)).     Glucagon, rDNA, (GLUCAGON EMERGENCY) 1 MG KIT Inject 1 mg as directed as needed (BG < 70 (not arousable, conscious or able to swallow)).     insulin aspart (NOVOLOG) 100 UNIT/ML injection Inject 3-15 Units into the skin See admin instructions. 3 times daily per sliding scale 140-180 = 3 units 181-241 = 4 units  242-300 = 6 units 301-350 = 8 units 351-400 =  12 units 401-450 = 15 units > 450 - notify provider     Insulin Glargine (BASAGLAR KWIKPEN) 100 UNIT/ML Inject 10 Units into the skin daily.     Ipratropium-Albuterol (COMBIVENT) 20-100 MCG/ACT AERS respimat Inhale 1 puff into the lungs every 6 (six) hours as needed for wheezing.     ketoconazole (NIZORAL) 2 % cream Apply 1 Application topically See admin instructions. Apply to scalp topically every day shift on Tuesday, Friday, Sunday     PROTEIN PO Take 30 mLs by mouth 2 (two) times daily. Protein liquid     TRADJENTA 5 MG TABS tablet Take 5 mg by mouth daily.     collagenase (SANTYL) 250 UNIT/GM ointment Apply  1 Application topically See admin instructions. Apply to right heel every evening shift for diabetic wound care and as needed for soiled, displaced dressing     simvastatin (ZOCOR) 20 MG tablet Take 20 mg by mouth at bedtime.   3   No facility-administered medications prior to visit.    PAST MEDICAL HISTORY: Past Medical History:  Diagnosis Date   Abscess of scrotum 09/19/2014   Acute encephalopathy    Allergy, unspecified, initial encounter 04/06/2019   Altered mental status, unspecified 04/21/2018   Anemia 12/08/2016   Formatting of this note might be different from the original. Last Assessment & Plan:  Multifactorial chronic with DDx including iron deficiency, CKD, acute blood loss from self discontinuation of HD catheter.  Noted pancytopenia.  No gross blood loss in stool noticed, FOBT neg.  Transfused 1unit PRBC 2/20. - Continue oral iron - Consulted hematology-recs appreciated etiology likely hepatosplenome   Anemia due to chronic kidney disease 06/18/2018   Anemia due to chronic kidney disease, on chronic dialysis (HCC) 04/07/2018   Anemia in chronic kidney disease 08/28/2018   Anemia in ESRD (end-stage renal disease) (HCC) 06/18/2018   Anxiety disorder, unspecified    Axillary abscess 02/03/2014   Bacteremia due to Klebsiella pneumoniae 06/19/2018   Benign essential  HTN 05/24/2018   Benign hypertension with chronic kidney disease, stage III (HCC) 06/22/2016   Benign prostatic hyperplasia without lower urinary tract symptoms 05/24/2018   Bradycardia    Cellulitis of toe of right foot 09/19/2014   Chest pain, unspecified 01/27/2020   Cirrhosis (HCC) 04/2018   Closed fracture of left upper extremity with routine healing 03/20/2018   Last Assessment & Plan:  Formatting of this note might be different from the original. Unclear etiology.   --Ortho consulted on admission s/p removal of cast and application of volar/dorsal splint  --Has had arranged follow-up with Orvan Falconer NP will need to continue to follow-up for removal of cast   Closed intertrochanteric fracture of left femur (HCC) 01/21/2018   Coagulopathy (HCC) 02/06/2019   COVID-19 virus infection 01/09/2019   Cutaneous abscess of buttock    Delirium 03/14/2018   Last Assessment & Plan:  Formatting of this note might be different from the original. Multifactorial-combination of AKI, hypercarbia, non-cirrhotic hyperammonemia, hospital-acquired delirium-resolved.  Mental status now appears to be at baseline.  CT head- no acute findings: Generalized cerebral volume loss is advanced for patient's age, possible otomastoiditis correlate clinically(patient denies   Diabetes mellitus without complication (HCC)    type II   Diabetic ketoacidosis (HCC) 12/08/2016   Diabetic ulcer of toe associated with type 2 diabetes mellitus, limited to breakdown of skin (HCC) 02/14/2017   Diarrhea, unspecified 06/08/2018   Difficult intravenous access 03/16/2018   Last Assessment & Plan:  Formatting of this note might be different from the original. TLC 2/21 Consent obtained from patient and his cousin Gaynelle Adu Goins   DKA (diabetic ketoacidosis) (HCC) 08/04/2020   Dyspnea, unspecified 06/01/2018   Encounter for removal of sutures 09/15/2020   Encounter for screening for respiratory tuberculosis 06/01/2018   End stage liver  disease (HCC) 01/09/2019   Enterocolitis due to Clostridium difficile, not specified as recurrent 08/09/2018   admission   Epilepsy, unspecified, not intractable, without status epilepticus (HCC)    ESRD on hemodialysis (HCC) 12/27/2018   Failure to thrive in adult 01/21/2019   Fall 01/21/2018   Fever 06/18/2018   Fracture of left tibia 01/21/2018   Fracture of left ulna 01/21/2018   GERD (gastroesophageal  reflux disease) 12/08/2016   Gout, unspecified    Gram negative sepsis (HCC) 12/27/2018   HCAP (healthcare-associated pneumonia) 04/21/2018   Heart failure, unspecified (HCC)    History of Clostridioides difficile colitis 12/27/2018   HTN (hypertension) 03/08/2018   Formatting of this note might be different from the original. Last Assessment & Plan:  Patient on several meds as outpatient Cont hydralazine, low-dose amlodipine titrate as required Cont to monitor bp Last Assessment & Plan:  Formatting of this note might be different from the original. 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 pneumoniae) as the cause of diseases classified elsewhere 06/25/2018   admission   Leukocytosis 06/18/2018   Liver failure (HCC)    Major depressive disorder, single episode, unspecified    Metabolic acidosis 04/2018   Metabolic bone disease 05/11/2018   Open wound of left foot except toes with complication 12/08/2016   Other cirrhosis of liver (HCC) 06/01/2018   Other fluid overload 04/10/2019   Other hypoglycemia 03/08/2018   type II Formatting of this note might be different from the original. Last Assessment & Plan:  Hemoglobin A1c 8 Carb controlled diet Sliding scale insulin, lantus 15 units nightly upon discharge up titrate as needed Medium carbohydrate  diet at facility Fingerstick monitoring Last Assessment & Plan:  Formatting of this note might be different from the original. Hemoglobin A1c 8 Carb controlled diet   Pain, unspecified 07/09/2018   Personal history of COVID-19 02/25/2019   Physical deconditioning 03/08/2018   Last Assessment & Plan:  Formatting of this note might be different from the original. -Patient is resident at [facility]. -PT/OT consulted will need continue PT services at facility  -ST consult Formatting of this note might be different from the original. Last Assessment & Plan:  -Patient is resident at [facility]. -PT/OT consulted will need continue PT services at facility  -ST consult   Pneumonia    Polyneuropathy, unspecified    Postprocedural hematoma of a genitourinary system organ or structure following a genitourinary system procedure 02/14/2019   Pressure injury of lower back, stage 2 (HCC) 03/08/2018   Last Assessment & Plan:  Formatting of this note might be different from the original. No discharge, clean appearing wound Continue routine wound care Formatting of this note might be different from the original. Last Assessment & Plan:  No discharge, clean appearing wound Continue routine wound care   Pressure injury of skin 04/21/2018   Protein-calorie malnutrition, severe (HCC) 01/21/2019   Respiratory failure (HCC)    Retroperitoneal abscess (HCC) 03/13/2019   Secondary hyperparathyroidism of renal origin (HCC) 08/28/2018   Seizures (HCC) 05/24/2018   Sepsis (HCC) 05/24/2018   Septic shock (HCC) 01/09/2019   Thrombocytopenia (HCC) 05/24/2018   Tobacco use    Trauma 01/21/2018   Type 2 diabetes mellitus with unspecified complications (HCC) 06/22/2016   Urinary calculus, unspecified 03/08/2018   Last Assessment & Plan:  Formatting of this note might be different from the original. Resolved  creatinine 7.3. on admission Most likely ATN-urine output now appears to stabilized Baseline  2.5-3 in June of 2019 according to OSH  records No recent baseline available Nephro consulted patient started on HD via perm cath which he pulled 2/19 Cr improved Cont flomax renaly dose all meds Monitor cr and   Vitamin  D deficiency     PAST SURGICAL HISTORY: Past Surgical History:  Procedure Laterality Date   APPENDECTOMY  1981   AV FISTULA PLACEMENT Right 10/23/2018   Procedure: ARTERIOVENOUS (AV) FISTULA CREATION RIGHT ARM;  Surgeon: Nada Libman, MD;  Location: [facility] OR;  Service: Vascular;  Laterality: Right;   CHOLECYSTECTOMY     HERNIA REPAIR  2007   IR FLUORO GUIDE CV LINE RIGHT  05/30/2018   IR FLUORO GUIDE CV LINE RIGHT  03/18/2019   IR FLUORO GUIDE CV LINE RIGHT  03/22/2019   IR REMOVAL TUN CV CATH W/O FL  03/15/2019   IR SINUS/FIST TUBE CHK-NON GI  04/04/2019   IR US GUIDE VASC ACCESS RIGHT  05/30/2018   IR US GUIDE VASC ACCESS RIGHT  03/18/2019   IR US GUIDE VASC ACCESS RIGHT  03/22/2019   NEPHRECTOMY Left 2020    FAMILY HISTORY: Family History  Problem Relation Age of Onset   Seizures Neg Hx     SOCIAL HISTORY: Social History   Socioeconomic History   Marital status: Single    Spouse name: Not on file   Number of children: 0   Years of education: Not on file   Highest education level: High school graduate  Occupational History   Not on file  Tobacco Use   Smoking status: Every Day    Packs/day: 1.00    Types: Cigarettes   Smokeless tobacco: Never  Vaping Use   Vaping Use: Never used  Substance and Sexual Activity   Alcohol use: Not Currently   Drug use: Never   Sexual activity: Not on file  Other Topics Concern   Not on file  Social History Narrative   Lives at [facility]   Left handed   Caffeine: "none hardly"   Social Determinants of Health   Financial Resource Strain: Not on file  Food Insecurity: Not on file  Transportation Needs: Not on file  Physical Activity: Not on file  Stress: Not on file  Social Connections: Not on file  Intimate Partner Violence: Not on file     PHYSICAL EXAM  GENERAL EXAM/CONSTITUTIONAL: Vitals:  Vitals:   03/08/22 1545  BP: (!) 145/86  Pulse: 75  Height: 6\' 2"  (1.88 m)   Body mass index is 29.04 kg/m. Wt Readings from Last 3 Encounters:  11/28/21 226 lb 3.1 oz (102.6 kg)  10/19/21 225 lb 15 oz (102.5 kg)  10/11/21 249 lb 1.9 oz (113 kg)   Patient is in no distress; well developed, nourished and groomed; neck is supple  EYES: Visual fields full to confrontation, Extraocular movements intacts,  No results found.  MUSCULOSKELETAL: Gait, strength, tone, movements noted in Neurologic exam below  NEUROLOGIC: MENTAL STATUS:      No data to display         awake, alert, oriented to person, place and time recent and remote memory intact normal attention and concentration language fluent, comprehension intact, naming intact fund of knowledge appropriate  CRANIAL NERVE:  2nd, 3rd, 4th, 6th - Visual fields full to confrontation, extraocular muscles intact, no nystagmus 5th - facial sensation symmetric 7th - facial strength symmetric 8th - hearing intact 9th - palate elevates symmetrically, uvula midline 11th - shoulder shrug symmetric 12th - tongue protrusion midline  MOTOR:  normal bulk and tone, at least antigravity in the BUE and BLE.   SENSORY:  normal and symmetric to light touch  COORDINATION:  finger-nose-finger, fine finger movements normal  GAIT/STATION:  Deferred, in  a wheelchair    DIAGNOSTIC DATA (LABS, IMAGING, TESTING) - I reviewed patient records, labs, notes, testing and imaging myself where available.  Lab Results  Component Value Date   WBC 4.9 11/28/2021   HGB 8.5 (L) 11/28/2021   HCT 25.1 (L) 11/28/2021   MCV 103.7 (H) 11/28/2021   PLT 104 (L) 11/28/2021      Component Value Date/Time   NA 136 11/28/2021 2306   NA 135 02/04/2020 0937   K 3.6 11/28/2021 2306   CL 100 11/28/2021 2306   CO2 17 (L) 11/28/2021 2306   GLUCOSE 266 (H) 11/28/2021 2306   BUN 44 (H)  11/28/2021 2306   BUN 50 (H) 02/04/2020 0937   CREATININE 9.20 (H) 11/28/2021 2306   CALCIUM 7.2 (L) 11/28/2021 2306   CALCIUM 7.2 (L) 05/29/2018 0500   PROT 6.8 11/28/2021 0642   ALBUMIN 2.7 (L) 11/28/2021 2306   AST 12 (L) 11/28/2021 0642   ALT 10 11/28/2021 0642   ALKPHOS 127 (H) 11/28/2021 0642   BILITOT 1.4 (H) 11/28/2021 0642   GFRNONAA 6 (L) 11/28/2021 2306   GFRAA 7 (L) 02/04/2020 0937   Lab Results  Component Value Date   CHOL 76 05/24/2018   HDL 35 (L) 05/24/2018   LDLCALC 16 05/24/2018   TRIG 123 05/24/2018   Lab Results  Component Value Date   HGBA1C 9.0 (H) 04/28/2021   Lab Results  Component Value Date   VITAMINB12 391 04/13/2021   No results found for: "TSH"    Ambulatory EEG 06/09/2022 This is an abnormal 72 hrs ambulatory video EEG due to presence of mild diffuse slowing and right frontal slowing. Right frontal slowing is consistent with an area of neuronal dysfunction within the right frontal region and diffuse slowing is consistent with a generalized brain dysfunction such as in encephalopathy, nonspecific.     ASSESSMENT AND PLAN  52 y.o. year old male  with multiple medical conditions including intractable epilepsy, end-stage renal disease on dialysis, hypertension, hyperlipidemia and osteoporosis who is presenting for further management of his epilepsy.  He is currently on high-dose phenobarbital, levetiracetam and carbamazepine.  His seizure frequency improved from 10 a week to 10/month.  His seizures are still intractable.  At the moment we discussed VNS therapy.  He seems interested.  I provided additional information and patient will contact us if he is willing to move forward with the implantation.  For now we will continue his current medications I will see him in 6 months for follow-up but advised him to contact me sooner if he has any questions or concerns   1. Intractable epilepsy without status epilepticus, unspecified epilepsy type [facility])      Patient Instructions  Continue current medications   Phenobarbital 259.2 mg nightly   Levetiracetam 1500 mg at bedtime with additional 250 mg on dialysis days   Carbamazepine 900 mg twice daily  Discussed VNS therapy, patient will contact us if he is agreeable to move forward with implantation  Follow up in 6 months or sooner if worse    Per [facility] statutes, patients with seizures are not allowed to drive until they have been seizure-free for six months.  Other recommendations include using caution when using heavy equipment or power tools. Avoid working on ladders or at heights. Take showers instead of baths.  Do not swim alone.  Ensure the water temperature is not too high on the home water heater. Do not go swimming alone. Do not lock yourself in  a room alone (i.e. bathroom). When caring for infants or small children, sit down when holding, feeding, or changing them to minimize risk of injury to the child in the event you have a seizure. Maintain good sleep hygiene. Avoid alcohol.  Also recommend adequate sleep, hydration, good diet and minimize stress.   During the Seizure  - First, ensure adequate ventilation and place patients on the floor on their left side  Loosen clothing around the neck and ensure the airway is patent. If the patient is clenching the teeth, do not force the mouth open with any object as this can cause severe damage - Remove all items from the surrounding that can be hazardous. The patient may be oblivious to what's happening and may not even know what he or she is doing. If the patient is confused and wandering, either gently guide him/her away and block access to outside areas - Reassure the individual and be comforting - Call 911. In most cases, the seizure ends before EMS arrives. However, there are cases when seizures may last over 3 to 5 minutes. Or the individual may have developed breathing difficulties or severe injuries. If a pregnant patient  or a person with diabetes develops a seizure, it is prudent to call an ambulance. - Finally, if the patient does not regain full consciousness, then call EMS. Most patients will remain confused for about 45 to 90 minutes after a seizure, so you must use judgment in calling for help. - Avoid restraints but make sure the patient is in a bed with padded side rails - Place the individual in a lateral position with the neck slightly flexed; this will help the saliva drain from the mouth and prevent the tongue from falling backward - Remove all nearby furniture and other hazards from the area - Provide verbal assurance as the individual is regaining consciousness - Provide the patient with privacy if possible - Call for help and start treatment as ordered by the caregiver   After the Seizure (Postictal Stage)  After a seizure, most patients experience confusion, fatigue, muscle pain and/or a headache. Thus, one should permit the individual to sleep. For the next few days, reassurance is essential. Being calm and helping reorient the person is also of importance.  Most seizures are painless and end spontaneously. Seizures are not harmful to others but can lead to complications such as stress on the lungs, brain and the heart. Individuals with prior lung problems may develop labored breathing and respiratory distress.     No orders of the defined types were placed in this encounter.   No orders of the defined types were placed in this encounter.   Return in about 6 months (around 09/06/2022).  I have spent a total of 50 minutes dedicated to this patient today, preparing to see patient, performing a medically appropriate examination and evaluation, ordering tests and/or medications and procedures, and counseling and educating the patient/family/caregiver; independently interpreting result and communicating results to the family/patient/caregiver; and documenting clinical information in the electronic  medical record.   Windell Norfolk, MD 03/10/2022, 8:51 AM  [facility] [address], [address] [place], [place] [address] [phone]

## 2022-03-10 NOTE — Patient Instructions (Addendum)
Continue current medications   Phenobarbital 259.2 mg nightly   Levetiracetam 1500 mg at bedtime with additional 250 mg on dialysis days   Carbamazepine 900 mg twice daily  Discussed VNS therapy, patient will contact us if he is agreeable to move forward with implantation  Follow up in 6 months or sooner if worse

## 2022-05-11 ENCOUNTER — Telehealth: Payer: Self-pay | Admitting: Neurology

## 2022-05-11 NOTE — Telephone Encounter (Signed)
Madison NP from [facility] called. Requesting a nurse give her a call because she have questions about pt medication.

## 2022-05-12 NOTE — Telephone Encounter (Signed)
I will have to see him in clinic to make these changes. If no urgency, I can see him as scheduled in August.

## 2022-05-12 NOTE — Telephone Encounter (Signed)
Called and relayed message to [place] and she was agreeable to let him be seen in august

## 2022-07-13 ENCOUNTER — Other Ambulatory Visit: Payer: Self-pay | Admitting: Neurology

## 2022-07-13 NOTE — Progress Notes (Signed)
Recent labs results from facility 07/06/2022 Carbamazepine  6.0 Phenobarb  10.3 Keppra   24.7

## 2022-09-13 ENCOUNTER — Ambulatory Visit: Payer: Medicare Other | Admitting: Neurology

## 2022-09-14 ENCOUNTER — Telehealth: Payer: Self-pay | Admitting: Neurology

## 2022-09-14 NOTE — Telephone Encounter (Signed)
[facility]) Wanted to make Dr. Teresa Coombs aware pt isconsidering in doing VNS therapy.,

## 2022-09-15 ENCOUNTER — Ambulatory Visit: Payer: Medicare Other | Admitting: Neurology

## 2022-09-15 NOTE — Telephone Encounter (Signed)
Placed on wait list and vns form complete but awaiting Camara's signature when he returns.

## 2022-09-15 NOTE — Telephone Encounter (Signed)
Add him to the cancellation list and lets his paperwork ready to submit to [facility]

## 2022-09-19 NOTE — Telephone Encounter (Signed)
Do you still have the form? I can come get it

## 2022-09-19 NOTE — Telephone Encounter (Signed)
Form signed. Thanks!

## 2022-09-20 ENCOUNTER — Telehealth: Payer: Self-pay

## 2022-09-20 NOTE — Telephone Encounter (Signed)
Faxed info and demographics to [facility]/ [facility]

## 2022-09-28 ENCOUNTER — Telehealth: Payer: Self-pay

## 2022-09-28 NOTE — Telephone Encounter (Signed)
Faxed last vist notes with updated ICD code

## 2022-10-28 DIAGNOSIS — I440 Atrioventricular block, first degree: Secondary | ICD-10-CM | POA: Diagnosis not present

## 2022-12-27 ENCOUNTER — Ambulatory Visit: Payer: Medicare Other | Admitting: Neurology

## 2022-12-29 DIAGNOSIS — I361 Nonrheumatic tricuspid (valve) insufficiency: Secondary | ICD-10-CM | POA: Diagnosis not present

## 2023-01-09 DIAGNOSIS — I4891 Unspecified atrial fibrillation: Secondary | ICD-10-CM | POA: Diagnosis not present

## 2023-01-16 DIAGNOSIS — J9621 Acute and chronic respiratory failure with hypoxia: Secondary | ICD-10-CM | POA: Diagnosis not present

## 2023-01-16 DIAGNOSIS — Z930 Tracheostomy status: Secondary | ICD-10-CM | POA: Diagnosis not present

## 2023-01-16 DIAGNOSIS — R6520 Severe sepsis without septic shock: Secondary | ICD-10-CM | POA: Diagnosis not present

## 2023-01-16 DIAGNOSIS — N186 End stage renal disease: Secondary | ICD-10-CM | POA: Diagnosis not present

## 2023-01-17 DIAGNOSIS — J9621 Acute and chronic respiratory failure with hypoxia: Secondary | ICD-10-CM | POA: Diagnosis not present

## 2023-01-17 DIAGNOSIS — Z930 Tracheostomy status: Secondary | ICD-10-CM | POA: Diagnosis not present

## 2023-01-17 DIAGNOSIS — N186 End stage renal disease: Secondary | ICD-10-CM | POA: Diagnosis not present

## 2023-01-17 DIAGNOSIS — R6520 Severe sepsis without septic shock: Secondary | ICD-10-CM | POA: Diagnosis not present

## 2023-01-18 DIAGNOSIS — N186 End stage renal disease: Secondary | ICD-10-CM | POA: Diagnosis not present

## 2023-01-18 DIAGNOSIS — K529 Noninfective gastroenteritis and colitis, unspecified: Secondary | ICD-10-CM | POA: Diagnosis not present

## 2023-01-18 DIAGNOSIS — G9341 Metabolic encephalopathy: Secondary | ICD-10-CM | POA: Diagnosis not present

## 2023-01-21 DIAGNOSIS — G9341 Metabolic encephalopathy: Secondary | ICD-10-CM | POA: Diagnosis not present

## 2023-01-21 DIAGNOSIS — N186 End stage renal disease: Secondary | ICD-10-CM | POA: Diagnosis not present

## 2023-01-21 DIAGNOSIS — K529 Noninfective gastroenteritis and colitis, unspecified: Secondary | ICD-10-CM | POA: Diagnosis not present

## 2023-01-22 DIAGNOSIS — G9341 Metabolic encephalopathy: Secondary | ICD-10-CM | POA: Diagnosis not present

## 2023-01-22 DIAGNOSIS — K529 Noninfective gastroenteritis and colitis, unspecified: Secondary | ICD-10-CM | POA: Diagnosis not present

## 2023-01-22 DIAGNOSIS — N186 End stage renal disease: Secondary | ICD-10-CM | POA: Diagnosis not present

## 2023-01-26 ENCOUNTER — Other Ambulatory Visit: Payer: Self-pay

## 2023-01-26 ENCOUNTER — Ambulatory Visit (HOSPITAL_COMMUNITY): Admission: RE | Admit: 2023-01-26 | Discharge: 2023-01-26 | Payer: 59 | Attending: Nephrology | Admitting: Nephrology

## 2023-01-26 ENCOUNTER — Encounter (HOSPITAL_COMMUNITY)
Admission: RE | Disposition: A | Discharge status: Other Acute Inpatient Hospital | Payer: Self-pay | Source: Home / Self Care | Attending: Nephrology

## 2023-01-26 DIAGNOSIS — E1122 Type 2 diabetes mellitus with diabetic chronic kidney disease: Secondary | ICD-10-CM | POA: Diagnosis not present

## 2023-01-26 DIAGNOSIS — Z79899 Other long term (current) drug therapy: Secondary | ICD-10-CM | POA: Diagnosis not present

## 2023-01-26 DIAGNOSIS — G7281 Critical illness myopathy: Secondary | ICD-10-CM | POA: Insufficient documentation

## 2023-01-26 DIAGNOSIS — E1142 Type 2 diabetes mellitus with diabetic polyneuropathy: Secondary | ICD-10-CM | POA: Insufficient documentation

## 2023-01-26 DIAGNOSIS — Z992 Dependence on renal dialysis: Secondary | ICD-10-CM | POA: Insufficient documentation

## 2023-01-26 DIAGNOSIS — I509 Heart failure, unspecified: Secondary | ICD-10-CM | POA: Insufficient documentation

## 2023-01-26 DIAGNOSIS — F17210 Nicotine dependence, cigarettes, uncomplicated: Secondary | ICD-10-CM | POA: Diagnosis not present

## 2023-01-26 DIAGNOSIS — D631 Anemia in chronic kidney disease: Secondary | ICD-10-CM | POA: Insufficient documentation

## 2023-01-26 DIAGNOSIS — I132 Hypertensive heart and chronic kidney disease with heart failure and with stage 5 chronic kidney disease, or end stage renal disease: Secondary | ICD-10-CM | POA: Diagnosis not present

## 2023-01-26 DIAGNOSIS — Y832 Surgical operation with anastomosis, bypass or graft as the cause of abnormal reaction of the patient, or of later complication, without mention of misadventure at the time of the procedure: Secondary | ICD-10-CM | POA: Insufficient documentation

## 2023-01-26 DIAGNOSIS — K7460 Unspecified cirrhosis of liver: Secondary | ICD-10-CM | POA: Insufficient documentation

## 2023-01-26 DIAGNOSIS — T82858A Stenosis of vascular prosthetic devices, implants and grafts, initial encounter: Secondary | ICD-10-CM | POA: Insufficient documentation

## 2023-01-26 DIAGNOSIS — M109 Gout, unspecified: Secondary | ICD-10-CM | POA: Insufficient documentation

## 2023-01-26 DIAGNOSIS — G40909 Epilepsy, unspecified, not intractable, without status epilepticus: Secondary | ICD-10-CM | POA: Diagnosis not present

## 2023-01-26 DIAGNOSIS — N186 End stage renal disease: Secondary | ICD-10-CM | POA: Insufficient documentation

## 2023-01-26 DIAGNOSIS — Z794 Long term (current) use of insulin: Secondary | ICD-10-CM | POA: Insufficient documentation

## 2023-01-26 HISTORY — PX: PERIPHERAL VASCULAR BALLOON ANGIOPLASTY: CATH118281

## 2023-01-26 HISTORY — PX: A/V FISTULAGRAM: CATH118298

## 2023-01-26 SURGERY — A/V FISTULAGRAM
Anesthesia: LOCAL | Laterality: Right

## 2023-01-26 MED ORDER — LIDOCAINE HCL (PF) 1 % IJ SOLN
INTRAMUSCULAR | Status: AC
  Filled 2023-01-26: qty 30

## 2023-01-26 MED ORDER — HEPARIN (PORCINE) IN NACL 1000-0.9 UT/500ML-% IV SOLN
INTRAVENOUS | Status: DC | PRN
  Administered 2023-01-26: 500 mL

## 2023-01-26 MED ORDER — SODIUM CHLORIDE 0.9% FLUSH: 3.0000 mL | INTRAVENOUS | Status: DC | PRN

## 2023-01-26 MED ORDER — SODIUM CHLORIDE 0.9 % IV SOLN: INTRAVENOUS | Status: DC

## 2023-01-26 MED ORDER — FENTANYL CITRATE (PF) 100 MCG/2ML IJ SOLN
INTRAMUSCULAR | Status: DC | PRN
  Administered 2023-01-26: 25 ug via INTRAVENOUS

## 2023-01-26 MED ORDER — ONDANSETRON HCL 4 MG/2ML IJ SOLN
4.0000 mg | Freq: Four times a day (QID) | INTRAMUSCULAR | Status: DC | PRN
Start: 2023-01-26 — End: 2023-01-26

## 2023-01-26 MED ORDER — MIDAZOLAM HCL 2 MG/2ML IJ SOLN
INTRAMUSCULAR | Status: DC | PRN
  Administered 2023-01-26: 1 mg via INTRAVENOUS

## 2023-01-26 MED ORDER — IODIXANOL 320 MG/ML IV SOLN
INTRAVENOUS | Status: DC | PRN
  Administered 2023-01-26: 15 mL

## 2023-01-26 MED ORDER — FENTANYL CITRATE (PF) 100 MCG/2ML IJ SOLN
INTRAMUSCULAR | Status: AC
  Filled 2023-01-26: qty 2

## 2023-01-26 MED ORDER — ACETAMINOPHEN 325 MG PO TABS
650.0000 mg | ORAL_TABLET | ORAL | Status: DC | PRN
Start: 2023-01-26 — End: 2023-01-26

## 2023-01-26 MED ORDER — MIDAZOLAM HCL 2 MG/2ML IJ SOLN
INTRAMUSCULAR | Status: AC
  Filled 2023-01-26: qty 2

## 2023-01-26 SURGICAL SUPPLY — 10 items
BAG SNAP BAND KOVER 36X36 (MISCELLANEOUS) ×3 IMPLANT
BALLN ATHLETIS 12X40X75 (BALLOONS) ×2
BALLOON ATHLETIS 12X40X75 (BALLOONS) IMPLANT
COVER DOME SNAP 22 D (MISCELLANEOUS) ×3 IMPLANT
GUIDEWIRE ANGLED .035X150CM (WIRE) IMPLANT
KIT MICROPUNCTURE NIT STIFF (SHEATH) IMPLANT
SHEATH PINNACLE R/O II 7F 4CM (SHEATH) IMPLANT
SYR MEDALLION 10ML (SYRINGE) IMPLANT
TRAY PV CATH (CUSTOM PROCEDURE TRAY) ×3 IMPLANT
WIRE BENTSON .035X145CM (WIRE) IMPLANT

## 2023-01-26 NOTE — H&P (Signed)
 Chief Complaint: Cannulation difficulties, right arm swelling HPI:  53 year old man with past medical history significant for hypertension, type 2 diabetes mellitus, cirrhosis, gout, seizure disorder and end-stage renal disease on hemodialysis.  He is currently admitted to [facility] after presentation there with volume overload/sepsis; he was previously ventilator dependent with a tracheostomy from which he is now liberated/decannulated.  There have been problems with decreased flows of his fistula, cannulation issues and some right ipsilateral arm swelling noted by the patient.  He denies any chest pain or shortness of breath.  He reports that he had a light breakfast today at about 8:20 in the morning. He understands the procedure when explained to him and did not have any questions.  This right brachiocephalic fistula was created on 10/23/2018 by Dr. Serene.  Past Medical History:  Diagnosis Date   Abscess of scrotum 09/19/2014   Acute encephalopathy    Allergy, unspecified, initial encounter 04/06/2019   Altered mental status, unspecified 04/21/2018   Anemia 12/08/2016   Formatting of this note might be different from the original. Last Assessment & Plan:  Multifactorial chronic with DDx including iron deficiency, CKD, acute blood loss from self discontinuation of HD catheter.  Noted pancytopenia.  No gross blood loss in stool noticed, FOBT neg.  Transfused 1unit PRBC 2/20. - Continue oral iron - Consulted hematology-recs appreciated etiology likely hepatosplenome   Anemia due to chronic kidney disease 06/18/2018   Anemia due to chronic kidney disease, on chronic dialysis (HCC) 04/07/2018   Anemia in chronic kidney disease 08/28/2018   Anemia in ESRD (end-stage renal disease) (HCC) 06/18/2018   Anxiety disorder, unspecified    Axillary abscess 02/03/2014   Bacteremia due to Klebsiella pneumoniae 06/19/2018   Benign essential HTN 05/24/2018   Benign hypertension with chronic kidney  disease, stage III (HCC) 06/22/2016   Benign prostatic hyperplasia without lower urinary tract symptoms 05/24/2018   Bradycardia    Cellulitis of toe of right foot 09/19/2014   Chest pain, unspecified 01/27/2020   Cirrhosis (HCC) 04/2018   Closed fracture of left upper extremity with routine healing 03/20/2018   Last Assessment & Plan:  Formatting of this note might be different from the original. Unclear etiology.   --Ortho consulted on admission s/p removal of cast and application of volar/dorsal splint  --Has had arranged follow-up with Arlean Bigness NP will need to continue to follow-up for removal of cast   Closed intertrochanteric fracture of left femur (HCC) 01/21/2018   Coagulopathy (HCC) 02/06/2019   COVID-19 virus infection 01/09/2019   Cutaneous abscess of buttock    Delirium 03/14/2018   Last Assessment & Plan:  Formatting of this note might be different from the original. Multifactorial-combination of AKI, hypercarbia, non-cirrhotic hyperammonemia, hospital-acquired delirium-resolved.  Mental status now appears to be at baseline.  CT head- no acute findings: Generalized cerebral volume loss is advanced for patient's age, possible otomastoiditis correlate clinically(patient denies   Diabetes mellitus without complication (HCC)    type II   Diabetic ketoacidosis (HCC) 12/08/2016   Diabetic ulcer of toe associated with type 2 diabetes mellitus, limited to breakdown of skin (HCC) 02/14/2017   Diarrhea, unspecified 06/08/2018   Difficult intravenous access 03/16/2018   Last Assessment & Plan:  Formatting of this note might be different from the original. TLC 2/21 Consent obtained from patient and his cousin Harden Sprague   DKA (diabetic ketoacidosis) (HCC) 08/04/2020   Dyspnea, unspecified 06/01/2018   Encounter for removal of sutures 09/15/2020   Encounter for screening for  respiratory tuberculosis 06/01/2018   End stage liver disease (HCC) 01/09/2019   Enterocolitis due to Clostridium  difficile, not specified as recurrent 08/09/2018   admission   Epilepsy, unspecified, not intractable, without status epilepticus (HCC)    ESRD on hemodialysis (HCC) 12/27/2018   Failure to thrive in adult 01/21/2019   Fall 01/21/2018   Fever 06/18/2018   Fracture of left tibia 01/21/2018   Fracture of left ulna 01/21/2018   GERD (gastroesophageal reflux disease) 12/08/2016   Gout, unspecified    Gram negative sepsis (HCC) 12/27/2018   HCAP (healthcare-associated pneumonia) 04/21/2018   Heart failure, unspecified (HCC)    History of Clostridioides difficile colitis 12/27/2018   HTN (hypertension) 03/08/2018   Formatting of this note might be different from the original. Last Assessment & Plan:  Patient on several meds as outpatient Cont hydralazine , low-dose amlodipine  titrate as required Cont to monitor bp Last Assessment & Plan:  Formatting of this note might be different from the original. Patient on several meds as outpatient Cont hydralazine , low-dose amlodipine  titrate as required Cont to monitor   Hyperosmolar non-ketotic state in patient with type 2 diabetes mellitus (HCC) 02/14/2017   Hypertension    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   Klebsiella pneumoniae (k. pneumoniae) as the cause of diseases classified elsewhere 06/25/2018   admission   Leukocytosis 06/18/2018   Liver failure (HCC)    Major depressive disorder, single episode, unspecified    Metabolic acidosis 04/2018   Metabolic bone disease 05/11/2018   Open wound of left foot except toes with complication 12/08/2016   Other cirrhosis of liver (HCC) 06/01/2018   Other fluid overload 04/10/2019   Other hypoglycemia 03/08/2018   type II Formatting of this note might be different from the original. Last Assessment & Plan:  Hemoglobin A1c 8 Carb controlled diet Sliding scale insulin , lantus  15 units nightly upon discharge up titrate as needed Medium carbohydrate diet at facility Fingerstick monitoring Last Assessment & Plan:   Formatting of this note might be different from the original. Hemoglobin A1c 8 Carb controlled diet   Pain, unspecified 07/09/2018   Personal history of COVID-19 02/25/2019   Physical deconditioning 03/08/2018   Last Assessment & Plan:  Formatting of this note might be different from the original. -Patient is resident at [facility]. -PT/OT consulted will need continue PT services at facility  -ST consult Formatting of this note might be different from the original. Last Assessment & Plan:  -Patient is resident at [facility]. -PT/OT consulted will need continue PT services at facility  -ST consult   Pneumonia    Polyneuropathy, unspecified    Postprocedural hematoma of a genitourinary system organ or structure following a genitourinary system procedure 02/14/2019   Pressure injury of lower back, stage 2 (HCC) 03/08/2018   Last Assessment & Plan:  Formatting of this note might be different from the original. No discharge, clean appearing wound Continue routine wound care Formatting of this note might be different from the original. Last Assessment & Plan:  No discharge, clean appearing wound Continue routine wound care   Pressure injury of skin 04/21/2018   Protein-calorie malnutrition, severe (HCC) 01/21/2019   Respiratory failure (HCC)    Retroperitoneal abscess (HCC) 03/13/2019   Secondary hyperparathyroidism of renal origin (HCC) 08/28/2018   Seizures (HCC) 05/24/2018   Sepsis (HCC) 05/24/2018   Septic shock (HCC) 01/09/2019   Thrombocytopenia (HCC) 05/24/2018   Tobacco use    Trauma 01/21/2018   Type 2 diabetes mellitus with unspecified complications (HCC) 06/22/2016  Urinary calculus, unspecified 03/08/2018   Last Assessment & Plan:  Formatting of this note might be different from the original. Resolved  creatinine 7.3. on admission Most likely ATN-urine output now appears to stabilized Baseline  2.5-3 in June of 2019 according to OSH records No recent baseline available Nephro consulted patient  started on HD via perm cath which he pulled 2/19 Cr improved Cont flomax  renaly dose all meds Monitor cr and   Vitamin D  deficiency     Past Surgical History:  Procedure Laterality Date   APPENDECTOMY  1981   AV FISTULA PLACEMENT Right 10/23/2018   Procedure: ARTERIOVENOUS (AV) FISTULA CREATION RIGHT ARM;  Surgeon: Serene Gaile ORN, MD;  Location: [facility] OR;  Service: Vascular;  Laterality: Right;   CHOLECYSTECTOMY     HERNIA REPAIR  2007   IR FLUORO GUIDE CV LINE RIGHT  05/30/2018   IR FLUORO GUIDE CV LINE RIGHT  03/18/2019   IR FLUORO GUIDE CV LINE RIGHT  03/22/2019   IR REMOVAL TUN CV CATH W/O FL  03/15/2019   IR SINUS/FIST TUBE CHK-NON GI  04/04/2019   IR US  GUIDE VASC ACCESS RIGHT  05/30/2018   IR US  GUIDE VASC ACCESS RIGHT  03/18/2019   IR US  GUIDE VASC ACCESS RIGHT  03/22/2019   NEPHRECTOMY Left 2020    Family History  Problem Relation Age of Onset   Seizures Neg Hx    Social History:  reports that he has been smoking cigarettes. He has never used smokeless tobacco. He reports that he does not currently use alcohol. He reports that he does not use drugs.  Allergies:  Allergies  Allergen Reactions   Fish Oil Swelling        Ethyl Acetate Swelling   Lovaza [Omega-3-Acid Ethyl Esters (Fish)] Swelling    Medications Prior to Admission  Medication Sig Dispense Refill   acetaminophen  (TYLENOL ) 325 MG tablet Place 650 mg into feeding tube every 6 (six) hours as needed for fever (pain).     allopurinol  (ZYLOPRIM ) 100 MG tablet Place 100 mg into feeding tube daily.     calcitRIOL (ROCALTROL) 0.5 MCG capsule Place 0.5 mcg into feeding tube daily.     calcium  carbonate (TUMS - DOSED IN MG ELEMENTAL CALCIUM ) 500 MG chewable tablet Place 1,500 mg into feeding tube 3 (three) times daily before meals.     Darbepoetin Alfa  (ARANESP ) 150 MCG/0.3ML SOSY injection Inject 150 mcg into the skin every 7 (seven) days.     famotidine  (PEPCID ) 10 MG tablet Place 10 mg into feeding tube daily.      heparin  5000 UNIT/ML injection Inject 5,000 Units into the skin every 8 (eight) hours.     Insulin  Glargine (BASAGLAR  KWIKPEN) 100 UNIT/ML Inject 10 Units into the skin daily. (Patient taking differently: Inject 10 Units into the skin at bedtime.)     insulin  lispro (HUMALOG ) 100 UNIT/ML injection Inject into the skin every 6 (six) hours.     levothyroxine (SYNTHROID) 75 MCG tablet Take 75 mcg by mouth daily before breakfast. Per tube     LORazepam (ATIVAN) 0.5 MG tablet Take 0.5 mg by mouth every 6 (six) hours as needed for anxiety.     miconazole (MICOTIN) 2 % powder Apply 1 Application topically 2 (two) times daily.     Nutritional Supplements (NEPRO) LIQD Place 1 Can into feeding tube daily.     ondansetron  (ZOFRAN ) 4 MG tablet Take 4 mg by mouth every 6 (six) hours as needed for nausea (Iv push).  PHENObarbital  (LUMINAL) 30 MG tablet Place 180 mg into feeding tube at bedtime.     psyllium (METAMUCIL) 58.6 % packet Take 1 packet by mouth daily. Per tube     Skin Protectants, Misc. (EUCERIN) cream Apply 1 Application topically 2 (two) times daily.     tamsulosin  (FLOMAX ) 0.4 MG CAPS capsule Take 0.4 mg by mouth at bedtime. Per tube     insulin  aspart (NOVOLOG ) 100 UNIT/ML injection Inject 3-15 Units into the skin See admin instructions. 3 times daily per sliding scale 140-180 = 3 units 181-241 = 4 units  242-300 = 6 units 301-350 = 8 units 351-400 = 12 units 401-450 = 15 units > 450 - notify provider (Patient not taking: Reported on 01/23/2023)      No results found for this or any previous visit (from the past 48 hours). No results found.  Review of Systems  Constitutional:  Positive for fatigue. Negative for chills and fever.  HENT:  Negative for sinus pressure, sore throat and trouble swallowing.   Respiratory:  Negative for cough, chest tightness and shortness of breath.   Cardiovascular:  Negative for chest pain and leg swelling.  Gastrointestinal:  Negative for abdominal  pain and blood in stool.  Musculoskeletal:  Negative for back pain and myalgias.  Neurological:  Negative for dizziness, weakness and headaches.    There were no vitals taken for this visit. Physical Exam Vitals reviewed.  Constitutional:      Appearance: He is normal weight. He is ill-appearing.     Comments: Appears chronically ill  HENT:     Head: Normocephalic and atraumatic.     Right Ear: External ear normal.     Left Ear: External ear normal.     Nose: Nose normal.     Mouth/Throat:     Mouth: Mucous membranes are dry.     Pharynx: Oropharynx is clear.  Eyes:     Extraocular Movements: Extraocular movements intact.     Conjunctiva/sclera: Conjunctivae normal.  Cardiovascular:     Rate and Rhythm: Normal rate and regular rhythm.     Pulses: Normal pulses.     Heart sounds: Normal heart sounds.  Pulmonary:     Effort: Pulmonary effort is normal.     Breath sounds: Normal breath sounds. No wheezing.  Abdominal:     General: Abdomen is flat. Bowel sounds are normal.     Palpations: Abdomen is soft.  Musculoskeletal:     Cervical back: Normal range of motion and neck supple.     Right lower leg: No edema.     Left lower leg: No edema.     Comments: No lower extremity edema with diffuse xerosis  Skin:    General: Skin is warm and dry.     Coloration: Skin is pale.  Neurological:     Mental Status: He is alert and oriented to person, place, and time.      Assessment/Plan 1.  Right brachiocephalic fistula cannulation difficulties/decreased flows: Will undertake fistulogram today to evaluate for etiology and offer management. 2.  End-stage renal disease: Resume hemodialysis on MWF schedule at [facility].  Last hemodialysis done yesterday. 3.  Critical illness myopathy: Prolonged hospitalization at [facility] from which he appears to be recovering well, additional management per physiatry. 4.  Anemia: Secondary to ESRD/chronic illness, monitor with ESA.  Gordy MARLA Blanch, MD 01/26/2023, 11:26 AM

## 2023-01-26 NOTE — Op Note (Addendum)
 Indication: This right brachiocephalic fistula was placed on 10/23/2018 by Dr. Serene being referred for right arm ipsilateral swelling, decreased flows and cannulation difficulties.  Centrals treated back in August 2024 at [facility]; this is the third time we are performing a procedure on this current access.    On examination, the brachial cephalic fistula is hyperpulsatile at the inflow juxta level .  Interventional nephrologist: Dr. Lynwood Farr Assistant: Dr. 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  2)      The body of the cephalic vein fistula, anastomosis and arch were patent with good flows. 3)      This right BCF remains amenable to future percutaneous intervention.  Description of procedure: The arm was prepped and draped in the usual sterile fashion. The right upper arm brachial cephalic fistula was cannulated (63098) with a 21 G micropuncture needle directed in a antegrade direction. A guidewire was inserted and exchanged for a 7 Fr sheath. Contrast [phone]) injection via the side port of the sheath was performed. The angiogram of the fistula (63098) showed a patent body of the right BCF + cephalic vein arch with a 70-80% subclavian and also innominate vein stenosis retrograde flow up the jugular as well as the axillary vein.  We were unable to see the inflow because of numerous collaterals emptying medially in the mid upper arm.  The wire was advanced past the areas of interest and parked in the right atrium.    A  12 mm Athletis angioplasty balloon was inserted over a glide wire and positioned at the outflow subclavian and also innominate vein stenosis. Central venous angioplasty (63092) was carried out to 16-18  ATM with FULL effacement of the waist on the balloon both lesion sites.  Repeat angiogram showed 30% residual at the site of angioplasty with no evidence  of extravasation.  The flow of contrast was quicker and the fistula was markedly less pulsatile.  Hemostasis: A 3-0 ethilon purse string suture was placed at the cannulation site on removal of the sheath.  Sedation: 1 mg Versed , 25 mcg Fentanyl . Sedation time. 20 minutes  Contrast. 15 mL  Monitoring: Because of the patient's comorbid conditions and sedation during the procedure, continuous EKG monitoring and O2 saturation monitoring was performed throughout the procedure by the RN. There were no abnormal arrhythmias encountered.  Complications: None.   Diagnoses: I87.1 Stricture of vein  N18.6 ESRD T82.858A Stricture of access  Procedure Coding:  36901 Cannulation and angiogram of fistula 63092 Central venous angioplasty (subclavian and also innominate vein) V0032 Contrast  Recommendations:  1. Continue to cannulate the fistula with 15G needles.  2. Refer back for problems with flows. 3. Remove the suture next treatment.   Discharge: The patient was discharged home in stable condition. The patient was given education regarding the care of the dialysis access AVF and specific instructions in case of any problems.

## 2023-01-26 NOTE — Discharge Instructions (Signed)

## 2023-01-26 NOTE — Progress Notes (Signed)
 After Visit Summary, and the DR Patel's procedure report given to ambulance staff at time of discharge.

## 2023-01-26 NOTE — Progress Notes (Addendum)
 [facility] called, requesting transport back to [facility]. ETA for pick up 45 minutes.

## 2023-01-27 ENCOUNTER — Encounter (HOSPITAL_COMMUNITY): Payer: Self-pay | Admitting: Nephrology

## 2023-01-27 MED FILL — Lidocaine HCl Local Preservative Free (PF) Inj 1%: INTRAMUSCULAR | Qty: 30 | Status: AC

## 2023-02-04 DIAGNOSIS — G9341 Metabolic encephalopathy: Secondary | ICD-10-CM | POA: Diagnosis not present

## 2023-02-04 DIAGNOSIS — N186 End stage renal disease: Secondary | ICD-10-CM | POA: Diagnosis not present

## 2023-02-04 DIAGNOSIS — K529 Noninfective gastroenteritis and colitis, unspecified: Secondary | ICD-10-CM | POA: Diagnosis not present

## 2023-02-05 DIAGNOSIS — N186 End stage renal disease: Secondary | ICD-10-CM | POA: Diagnosis not present

## 2023-02-05 DIAGNOSIS — G9341 Metabolic encephalopathy: Secondary | ICD-10-CM | POA: Diagnosis not present

## 2023-02-05 DIAGNOSIS — K529 Noninfective gastroenteritis and colitis, unspecified: Secondary | ICD-10-CM | POA: Diagnosis not present

## 2023-04-11 ENCOUNTER — Ambulatory Visit: Payer: Medicare Other | Admitting: Neurology

## 2023-05-04 ENCOUNTER — Other Ambulatory Visit: Payer: Self-pay | Admitting: Neurology

## 2023-05-04 ENCOUNTER — Telehealth: Payer: Self-pay | Admitting: Neurology

## 2023-05-04 DIAGNOSIS — G40909 Epilepsy, unspecified, not intractable, without status epilepticus: Secondary | ICD-10-CM

## 2023-05-04 NOTE — Telephone Encounter (Signed)
 Return call to [facility], spoke with [facility]. She reports that she was unaware of increased seizures and went to speak with unit manager. She reported back to me that MD made staff aware that patient told her he was up through the night Sunday night with seizures. The MD did not witness any seizures and patient did not make staff aware of any seizures. Dorathy Daft  reports that the patient is  no longer taking any seizure medications and that most of his medications were stopped at his Feb hospital admission.He has had multiple hospital admission visits at various locations. He he noncompliant with insulin mediations and diet. He receives Dialysis [facility].  She does not have any information on the types of seizures or time frames of the seizures as they were not reported by the patient at the time of events and when reported no details were provided. She is going to have her unit manager Marchelle Folks call me back to discuss.   Marchelle Folks returned my call and we reviewed the above information and reconciled medication list. Advised I would send to Dr. Teresa Coombs to review and reach back out with advice or recommendations.

## 2023-05-04 NOTE — Telephone Encounter (Signed)
 Ok to add patient to the cancellation list. Will get him a 48 hours ambulatory EEG.

## 2023-05-04 NOTE — Telephone Encounter (Signed)
 Received a call from Carolyn-scheduler at [facility] re[facility] stated she was asked by clinical staff if pt can get in for a sooner appt due to increased seizures. I do not have anything sooner available right now for patient-he can ONLY do Tuesdays and Thursdays. Phone number to call clinical staff at facility [phone]. If something sooner is available can call scheduler directly at [phone].

## 2023-05-08 ENCOUNTER — Telehealth: Payer: Self-pay

## 2023-05-08 NOTE — Telephone Encounter (Signed)
 AON order submitted by [facility]

## 2023-05-08 NOTE — Telephone Encounter (Signed)
 Eddie Davies

## 2023-05-16 NOTE — Telephone Encounter (Signed)
 Eddie Davies

## 2023-05-31 DIAGNOSIS — R0602 Shortness of breath: Secondary | ICD-10-CM | POA: Diagnosis not present

## 2023-07-25 DEATH — deceased

## 2023-07-31 ENCOUNTER — Telehealth: Payer: Self-pay | Admitting: Neurology

## 2023-07-31 NOTE — Telephone Encounter (Signed)
 Called [facility] to confirm appt. Rehab stated pt has passed away.

## 2023-08-01 ENCOUNTER — Ambulatory Visit: Admitting: Neurology
